# Patient Record
Sex: Male | Born: 1955 | Race: Black or African American | Hispanic: No | Marital: Single | Smoking: Never smoker
Health system: Southern US, Community
[De-identification: ages and names within clinical notes are randomized; demographics above are authoritative.]

## PROBLEM LIST (undated history)

## (undated) DIAGNOSIS — K219 Gastro-esophageal reflux disease without esophagitis: Secondary | ICD-10-CM

## (undated) DIAGNOSIS — K317 Polyp of stomach and duodenum: Secondary | ICD-10-CM

## (undated) DIAGNOSIS — K3184 Gastroparesis: Secondary | ICD-10-CM

## (undated) DIAGNOSIS — K2961 Other gastritis with bleeding: Secondary | ICD-10-CM

## (undated) DIAGNOSIS — I10 Essential (primary) hypertension: Secondary | ICD-10-CM

## (undated) DIAGNOSIS — I639 Cerebral infarction, unspecified: Secondary | ICD-10-CM

## (undated) DIAGNOSIS — F79 Unspecified intellectual disabilities: Secondary | ICD-10-CM

## (undated) DIAGNOSIS — R569 Unspecified convulsions: Secondary | ICD-10-CM

## (undated) DIAGNOSIS — E7800 Pure hypercholesterolemia, unspecified: Secondary | ICD-10-CM

## (undated) DIAGNOSIS — Z931 Gastrostomy status: Secondary | ICD-10-CM

## (undated) DIAGNOSIS — K56609 Unspecified intestinal obstruction, unspecified as to partial versus complete obstruction: Secondary | ICD-10-CM

## (undated) DIAGNOSIS — F209 Schizophrenia, unspecified: Secondary | ICD-10-CM

## (undated) DIAGNOSIS — R1310 Dysphagia, unspecified: Secondary | ICD-10-CM

## (undated) DIAGNOSIS — K2270 Barrett's esophagus without dysplasia: Secondary | ICD-10-CM

## (undated) DIAGNOSIS — K922 Gastrointestinal hemorrhage, unspecified: Secondary | ICD-10-CM

## (undated) DIAGNOSIS — D509 Iron deficiency anemia, unspecified: Secondary | ICD-10-CM

## (undated) HISTORY — PX: CRANIOTOMY FOR CYST FENESTRATION: SHX1409

## (undated) NOTE — *Deleted (*Deleted)
Physical Medicine and Rehabilitation Admission H&P    Chief Complaint  Patient presents with  . Loss of Consciousness  : HPI: [name] is a 27 year old right-handed male with history of iron deficiency anemia, type 2 diabetes mellitus, CVA left-sided weakness and dysphagia, Barrett's esophagus, GERD with gastroparesis, hypertension, hyperlipidemia, mental retardation, schizophrenia, seizure disorder and hypothyroidism.  Per chart review patient has lived in a group home since 2012.  He does have local family to check on him routinely.  Patient ambulated modified independent.  Patient is able to dress himself and is modified independent for ADLs.  Presented 10/20/2020 after unwitnessed fall at group home facility question syncope related.  Patient subsequently had another syncopal episode with EMS reported to be pale and diaphoretic.  Admission chemistries glucose 163 BUN 37 creatinine 1.70, blood cultures no growth to date, procalcitonin 0.11, lactic acid 1.4, troponin 23, hemoglobin 8.2.  He was tachycardic with heart rate 120-130 SPO2 88% on room air CBG 255, SARS coronavirus negative.  Cranial CT scan no acute intracranial abnormality.  Large area encephalomalacia involving the right frontal lobe.  Chest x-ray without evidence of acute infarct.  CT angiogram of chest negative for pulmonary emboli.  CT abdomen pelvis showed a large stable gastric hernia with associated esophageal dilatation and subsequent mass-effect on the trachea.  Echocardiogram with ejection fraction of 70 to 75% no wall motion abnormalities grade 1 diastolic dysfunction.  EEG suggestive of mild to moderate diffuse encephalopathy no seizure.  He underwent EGD on 10/21/2020 per Dr. Leone Payor that showed dilation in the entire esophagus.  Tortuous esophagus.  Esophageal mucosal changes secondary to established short segment Barrett's disease.  There was a 10 cm hiatal hernia.  2 gastric polyps were biopsied.  He was able  to return to a regular diet.  On the early morning of 10/22/2020 rapid response patient developed significant respiratory distress subsequently intubated transferred to the ICU under critical care service.  Patient underwent hiatal hernia repair with G-tube placement by cardiothoracic surgery Dr. Cliffton Asters on 10/26/2020 change to jejunal arm via indwelling gastrostomy tube 11/07/2020 per interventional radiology.  He was extubated 11/05/2020.  During hospitalization patient developed pneumonia aspiration versus ventilatory associated pneumonia he did complete a course of Unasyn followed by Zithromax and currently oxygenating well on room air.  Subcutaneous heparin for DVT prophylaxis.  Therapy evaluations completed with recommendations of physical medicine follow-up and patient was admitted for a comprehensive rehab program.  Review of Systems  Constitutional: Negative for chills and fever.  HENT: Negative for hearing loss.   Eyes: Negative for blurred vision and double vision.  Respiratory: Positive for shortness of breath. Negative for cough and wheezing.   Cardiovascular: Negative for chest pain and leg swelling.  Gastrointestinal: Positive for constipation. Negative for heartburn, nausea and vomiting.       GERD  Genitourinary: Negative for dysuria, flank pain and hematuria.  Musculoskeletal: Positive for falls and myalgias.  Skin: Negative for rash.  Neurological: Positive for seizures.       Dysphagia  Psychiatric/Behavioral:       Schizophrenia/mental retardation  All other systems reviewed and are negative.  Past Medical History:  Diagnosis Date  . Barrett esophagus   . DM II (diabetes mellitus, type II), controlled (HCC) 11/13/2020  . Dysphagia   . Gastroparesis   . GERD (gastroesophageal reflux disease)   . Hypercholesteremia   . Hypertension   . Iron deficiency anemia   . Mental retardation   . Schizophrenia (HCC)   .  Seizure [facility])    Past Surgical History:  Procedure  Laterality Date  . BIOPSY  10/21/2020   Procedure: BIOPSY;  Surgeon: Iva Boop, MD;  Location: [facility] ENDOSCOPY;  Service: Endoscopy;;  . CRANIOTOMY FOR CYST FENESTRATION    . ESOPHAGOGASTRODUODENOSCOPY N/A 01/09/2015   Procedure: ESOPHAGOGASTRODUODENOSCOPY (EGD);  Surgeon: Louis Meckel, MD;  Location: [facility] ENDOSCOPY;  Service: Endoscopy;  Laterality: N/A;  . ESOPHAGOGASTRODUODENOSCOPY N/A 10/26/2020   Procedure: ESOPHAGOGASTRODUODENOSCOPY (EGD);  Surgeon: Corliss Skains, MD;  Location: [facility] OR;  Service: Thoracic;  Laterality: N/A;  . ESOPHAGOGASTRODUODENOSCOPY (EGD) WITH PROPOFOL N/A 10/21/2020   Procedure: ESOPHAGOGASTRODUODENOSCOPY (EGD) WITH PROPOFOL;  Surgeon: Iva Boop, MD;  Location: [facility] ENDOSCOPY;  Service: Endoscopy;  Laterality: N/A;  . IR GASTR TUBE CONVERT GASTR-JEJ PER W/FL MOD SED  11/07/2020  . PEG PLACEMENT N/A 10/26/2020   Procedure: PERCUTANEOUS ENDOSCOPIC GASTROSTOMY (PEG) PLACEMENT;  Surgeon: Corliss Skains, MD;  Location: [facility] OR;  Service: Thoracic;  Laterality: N/A;  . XI ROBOTIC ASSISTED HIATAL HERNIA REPAIR N/A 10/26/2020   Procedure: XI ROBOTIC ASSISTED HIATAL HERNIA REPAIR;  Surgeon: Corliss Skains, MD;  Location: [facility] OR;  Service: Thoracic;  Laterality: N/A;   History reviewed. No pertinent family history. Social History:  reports that he has never smoked. He has never used smokeless tobacco. He reports that he does not drink alcohol and does not use drugs. Allergies:  Allergies  Allergen Reactions  . Vancomycin Other (See Comments)    Unknown-possible hives    Medications Prior to Admission  Medication Sig Dispense Refill  . benazepril (LOTENSIN) 20 MG tablet Take 20 mg by mouth daily.    . Cholecalciferol (VITAMIN D) 2000 units tablet Take 2,000 Units by mouth daily.    . divalproex (DEPAKOTE ER) 500 MG 24 hr tablet Take 500 mg by mouth daily.    Marland Kitchen esomeprazole (NEXIUM) 40 MG capsule Take 40 mg by mouth in the morning and at bedtime.    .  lamoTRIgine (LAMICTAL) 100 MG tablet Take 100 mg by mouth daily.    Marland Kitchen levothyroxine (SYNTHROID, LEVOTHROID) 75 MCG tablet Take 75 mcg by mouth daily.    . metoCLOPramide (REGLAN) 5 MG tablet Take 1 tablet (5 mg total) by mouth 3 (three) times daily before meals. (Patient taking differently: Take 5 mg by mouth in the morning and at bedtime. ) 90 tablet 0  . propranolol (INDERAL) 10 MG tablet Take 10 mg by mouth daily.    . QUEtiapine (SEROQUEL) 400 MG tablet Take 400 mg by mouth 2 (two) times daily.     . rosuvastatin (CRESTOR) 20 MG tablet Take 20 mg by mouth daily.      Drug Regimen Review Drug regimen was reviewed and remains appropriate with no significant issues identified  Home: Home Living Family/patient expects to be discharged to:: Private residence Living Arrangements: Group Home Home Access: Ramped entrance Home Equipment: Walker - 2 wheels, Grab bars - tub/shower Additional Comments: ramped entry, walk in shower    Functional History: Prior Function Level of Independence: Needs assistance Gait / Transfers Assistance Needed: Pt ambulated mod I with no device.  Required cues to slow down ADL's / Homemaking Assistance Needed: Pt was able to dress self, but staff would straighten out his clothes after the fact, and assist with fasters as needed.  He was showering mod I prior to september before hospitalizations, but after that hospitalization he was requiring assist with shower transfer and showering  Communication / Swallowing Assistance Needed:  low volume and difficult to understand at times  Comments: Info provided by pt's sister who is his POA   Functional Status:  Mobility: Bed Mobility Overal bed mobility: Needs Assistance Bed Mobility: Supine to Sit, Rolling Rolling: Supervision Supine to sit: Min assist Sit to supine: Mod assist General bed mobility comments: minA for trunk rise from flat HOB, needs vc/tcs for BLE placement Transfers Overall transfer level: Needs  assistance Equipment used: Rolling walker (2 wheeled) Transfers: Sit to/from Stand, Stand Pivot Transfers Sit to Stand: Min assist, Mod assist Stand pivot transfers: Min assist, +2 physical assistance, +2 safety/equipment General transfer comment: from EOB>chair, pt needs +20minA for safety due to feeding tube and catheter lines and pt impulsivity, pt with posterior lean prior to proximity to chair; pt minA to stand from EOB with +1 assist and up to modA to stand from chair height with +1 assist (x5 total reps) Ambulation/Gait Ambulation/Gait assistance: Min assist, +2 physical assistance, +2 safety/equipment Gait Distance (Feet): 5 Feet Assistive device: Rolling walker (2 wheeled) Gait Pattern/deviations: Step-to pattern, Decreased step length - left, Decreased weight shift to left, Shuffle General Gait Details: bed>chair, needs heavy vcs and assist to manage RW, pt impulsive to sit prior to reaching chair Gait velocity: decreased Gait velocity interpretation:  (grossly <0.2 m/s)    ADL: ADL Overall ADL's : Needs assistance/impaired Eating/Feeding: NPO Grooming: Wash/dry hands, Wash/dry face, Minimal assistance, Bed level Grooming Details (indicate cue type and reason): increased time, cueing for thoroughness of tasks  Upper Body Bathing: Maximal assistance, Sitting Lower Body Bathing: Maximal assistance, Sit to/from stand Upper Body Dressing : Moderate assistance, Sitting Upper Body Dressing Details (indicate cue type and reason): Mod A to don clean hospital gown Lower Body Dressing: Total assistance, Sit to/from stand Lower Body Dressing Details (indicate cue type and reason): Total A to don socks and brief Toilet Transfer: Minimal assistance, +2 for physical assistance, +2 for safety/equipment, Stand-pivot, BSC, RW Toilet Transfer Details (indicate cue type and reason): Min A x 2 for safety in [facility] transfer using RW due to L hemiparesis and quick pace Toileting- Clothing Manipulation  and Hygiene: Total assistance, Sit to/from stand Toileting - Clothing Manipulation Details (indicate cue type and reason): Total A for hygiene after BM Functional mobility during ADLs: Moderate assistance, +2 for physical assistance, +2 for safety/equipment General ADL Comments: pt limited by cognition, safety awareness, weakness, L hemi  Cognition: Cognition Overall Cognitive Status: History of cognitive impairments - at baseline Orientation Level: Oriented to person, Oriented to place, Disoriented to time, Disoriented to situation Cognition Arousal/Alertness: Awake/alert Behavior During Therapy: Flat affect Overall Cognitive Status: History of cognitive impairments - at baseline General Comments: Hx of intellectual disability. pt impulsive at times, but good following of 1-step commands  Physical Exam: Blood pressure 116/76, pulse 88, temperature 97.9 F (36.6 C), temperature source Oral, resp. rate 19, height 5\' 4"  (1.626 m), weight 70.6 kg, SpO2 96 %. Physical Exam Neurological:     Comments: Patient is alert male in no acute distress mentally challenged and makes good eye contact with examiner.  He provided his name and age.  He stated he was in the hospital but was not sure of the name.  Follows simple commands.     Results for orders placed or performed during the hospital encounter of 10/20/20 (from the past 48 hour(s))  Glucose, capillary     Status: Abnormal   Collection Time: 11/13/20  7:51 AM  Result Value Ref Range   Glucose-Capillary 154 (  H) 70 - 99 mg/dL    Comment: Glucose reference range applies only to samples taken after fasting for at least 8 hours.  CBC     Status: Abnormal   Collection Time: 11/13/20  8:07 AM  Result Value Ref Range   WBC 5.9 4.0 - 10.5 K/uL   RBC 4.08 (L) 4.22 - 5.81 MIL/uL   Hemoglobin 10.8 (L) 13.0 - 17.0 g/dL   HCT 16.1 (L) 39 - 52 %   MCV 86.0 80.0 - 100.0 fL   MCH 26.5 26.0 - 34.0 pg   MCHC 30.8 30.0 - 36.0 g/dL   RDW 09.6 (H) 04.5  - 15.5 %   Platelets 320 150 - 400 K/uL   nRBC 0.0 0.0 - 0.2 %    Comment: Performed at [facility], [address] [place], [place] [address]  Comprehensive metabolic panel     Status: Abnormal   Collection Time: 11/13/20  8:07 AM  Result Value Ref Range   Sodium 134 (L) 135 - 145 mmol/L   Potassium 4.3 3.5 - 5.1 mmol/L   Chloride 99 98 - 111 mmol/L   CO2 26 22 - 32 mmol/L   Glucose, Bld 169 (H) 70 - 99 mg/dL    Comment: Glucose reference range applies only to samples taken after fasting for at least 8 hours.   BUN 13 8 - 23 mg/dL   Creatinine, Ser 1.91 0.61 - 1.24 mg/dL   Calcium 9.2 8.9 - 47.8 mg/dL   Total Protein 7.8 6.5 - 8.1 g/dL   Albumin 2.8 (L) 3.5 - 5.0 g/dL   AST 20 15 - 41 U/L   ALT 15 0 - 44 U/L   Alkaline Phosphatase 101 38 - 126 U/L   Total Bilirubin 0.4 0.3 - 1.2 mg/dL   GFR, Estimated >29 >56 mL/min    Comment: (NOTE) Calculated using the CKD-EPI Creatinine Equation (2021)    Anion gap 9 5 - 15    Comment: Performed at [facility], [address] [place], [place] [address]  Magnesium     Status: None   Collection Time: 11/13/20  8:07 AM  Result Value Ref Range   Magnesium 1.9 1.7 - 2.4 mg/dL    Comment: Performed at [facility], [address] [place], [place] [address]  Phosphorus     Status: None   Collection Time: 11/13/20  8:07 AM  Result Value Ref Range   Phosphorus 3.8 2.5 - 4.6 mg/dL    Comment: Performed at [facility], [address], [place], [place] [address]  D-dimer, quantitative (not at [facility])     Status: Abnormal   Collection Time: 11/13/20  8:07 AM  Result Value Ref Range   D-Dimer, Quant 4.63 (H) 0.00 - 0.50 ug/mL-FEU    Comment: (NOTE) At the manufacturer cut-off value of 0.5 g/mL FEU, this assay has a negative predictive value of 95-100%.This assay is intended for use in conjunction with a clinical pretest probability (PTP) assessment model to exclude pulmonary embolism (PE) and deep venous thrombosis  (DVT) in outpatients suspected of PE or DVT. Results should be correlated with clinical presentation. Performed at [facility], [address] [place], [place] [address]   Glucose, capillary     Status: Abnormal   Collection Time: 11/13/20 12:23 PM  Result Value Ref Range   Glucose-Capillary 187 (H) 70 - 99 mg/dL    Comment: Glucose reference range applies only to samples taken  after fasting for at least 8 hours.  Glucose, capillary     Status: Abnormal   Collection Time: 11/13/20  4:35 PM  Result Value Ref Range   Glucose-Capillary 131 (H) 70 - 99 mg/dL    Comment: Glucose reference range applies only to samples taken after fasting for at least 8 hours.  Glucose, capillary     Status: Abnormal   Collection Time: 11/13/20  9:14 PM  Result Value Ref Range   Glucose-Capillary 175 (H) 70 - 99 mg/dL    Comment: Glucose reference range applies only to samples taken after fasting for at least 8 hours.  Glucose, capillary     Status: Abnormal   Collection Time: 11/14/20 12:04 AM  Result Value Ref Range   Glucose-Capillary 181 (H) 70 - 99 mg/dL    Comment: Glucose reference range applies only to samples taken after fasting for at least 8 hours.  Basic metabolic panel     Status: Abnormal   Collection Time: 11/14/20  4:33 AM  Result Value Ref Range   Sodium 134 (L) 135 - 145 mmol/L   Potassium 4.4 3.5 - 5.1 mmol/L   Chloride 97 (L) 98 - 111 mmol/L   CO2 25 22 - 32 mmol/L   Glucose, Bld 193 (H) 70 - 99 mg/dL    Comment: Glucose reference range applies only to samples taken after fasting for at least 8 hours.   BUN 15 8 - 23 mg/dL   Creatinine, Ser 1.61 0.61 - 1.24 mg/dL   Calcium 9.3 8.9 - 09.6 mg/dL   GFR, Estimated >04 >54 mL/min    Comment: (NOTE) Calculated using the CKD-EPI Creatinine Equation (2021)    Anion gap 12 5 - 15    Comment: Performed at [facility], [address] [place], [place] [address]  CBC     Status: Abnormal   Collection Time: 11/14/20  4:33  AM  Result Value Ref Range   WBC 6.3 4.0 - 10.5 K/uL   RBC 3.99 (L) 4.22 - 5.81 MIL/uL   Hemoglobin 10.6 (L) 13.0 - 17.0 g/dL   HCT 91.4 (L) 39 - 52 %   MCV 86.5 80.0 - 100.0 fL   MCH 26.6 26.0 - 34.0 pg   MCHC 30.7 30.0 - 36.0 g/dL   RDW 78.2 (H) 95.6 - 21.3 %   Platelets 350 150 - 400 K/uL   nRBC 0.0 0.0 - 0.2 %    Comment: Performed at [facility], [address], [place], [place] [address]  Glucose, capillary     Status: Abnormal   Collection Time: 11/14/20  4:33 AM  Result Value Ref Range   Glucose-Capillary 182 (H) 70 - 99 mg/dL    Comment: Glucose reference range applies only to samples taken after fasting for at least 8 hours.  Glucose, capillary     Status: Abnormal   Collection Time: 11/14/20  7:39 AM  Result Value Ref Range   Glucose-Capillary 170 (H) 70 - 99 mg/dL    Comment: Glucose reference range applies only to samples taken after fasting for at least 8 hours.  Glucose, capillary     Status: Abnormal   Collection Time: 11/14/20 11:54 AM  Result Value Ref Range   Glucose-Capillary 136 (H) 70 - 99 mg/dL    Comment: Glucose reference range applies only to samples taken after fasting for at least 8 hours.  Glucose, capillary     Status: Abnormal   Collection Time: 11/14/20  4:40  PM  Result Value Ref Range   Glucose-Capillary 175 (H) 70 - 99 mg/dL    Comment: Glucose reference range applies only to samples taken after fasting for at least 8 hours.  Glucose, capillary     Status: Abnormal   Collection Time: 11/14/20  7:43 PM  Result Value Ref Range   Glucose-Capillary 148 (H) 70 - 99 mg/dL    Comment: Glucose reference range applies only to samples taken after fasting for at least 8 hours.  Glucose, capillary     Status: Abnormal   Collection Time: 11/15/20 12:21 AM  Result Value Ref Range   Glucose-Capillary 174 (H) 70 - 99 mg/dL    Comment: Glucose reference range applies only to samples taken after fasting for at least 8 hours.  Glucose, capillary      Status: Abnormal   Collection Time: 11/15/20  5:07 AM  Result Value Ref Range   Glucose-Capillary 142 (H) 70 - 99 mg/dL    Comment: Glucose reference range applies only to samples taken after fasting for at least 8 hours.   CT ANGIO CHEST PE W OR WO CONTRAST  Result Date: 11/13/2020 CLINICAL DATA:  Respiratory failure. EXAM: CT ANGIOGRAPHY CHEST WITH CONTRAST TECHNIQUE: Multidetector CT imaging of the chest was performed using the standard protocol during bolus administration of intravenous contrast. Multiplanar CT image reconstructions and MIPs were obtained to evaluate the vascular anatomy. CONTRAST:  OMNIPAQUE IOHEXOL 350 MG/ML SOLN COMPARISON:  10/05/2007 FINDINGS: Cardiovascular: Contrast injection is sufficient to demonstrate satisfactory opacification of the pulmonary arteries to the segmental level. There is no pulmonary embolus or evidence of right heart strain. The size of the main pulmonary artery is normal. Heart size is normal, with no pericardial effusion. The course and caliber of the aorta are normal. There is no atherosclerotic calcification. Opacification decreased due to pulmonary arterial phase contrast bolus timing. Mediastinum/Nodes: -- No mediastinal lymphadenopathy. -- No hilar lymphadenopathy. -- No axillary lymphadenopathy. --there are few mildly enlarged bilateral supraclavicular lymph nodes of unknown clinical significance. -- Normal thyroid gland where visualized. -the esophagus is patulous and dilated. The patient appears to be status post prior hiatal hernia repair. There is a residual small hiatal hernia. Lungs/Pleura: There is atelectasis at the lung bases bilaterally with trace bilateral pleural effusions. There is mucous plugging and bronchial wall thickening primarily at the lung bases. There is right apical pleuroparenchymal scarring. There is no pneumothorax. Upper Abdomen: Contrast bolus timing is not optimized for evaluation of the abdominal organs. The stomach  is distended. There is a partially visualized gastrojejunostomy tube in place. Musculoskeletal: No chest wall abnormality. No bony spinal canal stenosis. Review of the MIP images confirms the above findings. IMPRESSION: 1. There is no evidence for acute pulmonary embolus or aortic dissection. 2. There is atelectasis at the lung bases bilaterally with trace bilateral pleural effusions. 3. There is mucous plugging and bronchial wall thickening primarily at the lung bases. Findings may be secondary to infectious or reactive bronchiolitis. 4. The esophagus is patulous and dilated. The patient appears to be status post prior hiatal hernia repair. There is a residual small hiatal hernia. 5. The stomach is distended. There is a partially visualized gastrojejunostomy tube in place. 6. Mildly enlarged bilateral supraclavicular lymph nodes of unknown clinical significance. Electronically Signed   By: Katherine Mantle M.D.   On: 11/13/2020 19:57       Medical Problem List and Plan: 1.  Decreased functional mobility secondary to acute hypoxic hypercapnic respiratory failure with  aspiration/ventilatory support pneumonia  -patient may *** shower  -ELOS/Goals: *** 2.  Antithrombotics: -DVT/anticoagulation: Subcutaneous heparin  -antiplatelet therapy: N/A 3. Pain Management: Oxycodone as needed 4. Mood: ***  -antipsychotic agents: Seroquel 400 mg twice daily 5. Neuropsych: This patient is not capable of making decisions on his own behalf. 6. Skin/Wound Care: Routine skin checks 7. Fluids/Electrolytes/Nutrition: Routine in and outs with follow-up chemistries 8.  Paraesophageal hernia/massively dilated esophagus.  Status post hernia repair percutaneous gastrostomy tube placement 10/26/2020 per cardiothoracic surgery Dr. Cliffton Asters changed to placement of jejunal arm via indwelling gastrostomy tube 11/07/2020 per interventional radiology.  Continue Protonix twice daily, MiraLAX twice daily, Colace twice daily.   Transition to bolus tube feeds 9.  Iron deficiency anemia.  Follow-up CBC 10.  Schizophrenia/intellectual delay/seizure disorder.  Valproic acid 250 mg twice daily, Lamictal 100 mg daily 11.  Hypothyroidism.  Synthroid 12.  Type 2 diabetes mellitus.  Hemoglobin A1c 5.8.  SSI. 13.  Hyperlipidemia.  Crestor 14.  History of CVA with left-sided residual weakness continue therapies as directed  ***  Charlton Amor, PA-C 11/15/2020

## (undated) NOTE — *Deleted (*Deleted)
Individualized overall Plan of Care [facility]) Patient Details Name: [name] MRN: [id number] DOB: Feb 01, 1956  Admitting Diagnosis: Debility  Hospital Problems: Principal Problem:   Debility     Functional Problem List: Nursing Bladder, Endurance, Medication Management, Nutrition, Safety  PT Balance, Endurance, Motor, Perception, Safety  OT Balance, Cognition, Endurance, Motor, Pain, Perception, Nutrition, Safety, Vision, Sensory  SLP Cognition, Nutrition  TR         Basic ADL's: OT Grooming, Bathing, Dressing, Toileting     Advanced  ADL's: OT       Transfers: PT Bed Mobility, Bed to Chair, Car, Occupational psychologist, Research scientist (life sciences): PT Ambulation, Psychologist, prison and probation services, Stairs     Additional Impairments: OT    SLP Swallowing      TR      Anticipated Outcomes Item Anticipated Outcome  Self Feeding S (pending NPO status change)  Swallowing  supervision   Basic self-care  S  Toileting  S   Bathroom Transfers S  Bowel/Bladder  Manage bladder w/ min assist  Transfers  supervision with LRAD  Locomotion  supervision with LRAD  Communication     Cognition     Pain  N/A  Safety/Judgment  Manage safety w/ cues and reminders   Therapy Plan: PT Intensity: Minimum of 1-2 x/day ,45 to 90 minutes PT Frequency: 5 out of 7 days PT Duration Estimated Length of Stay: 14-18 days OT Intensity: Minimum of 1-2 x/day, 45 to 90 minutes OT Frequency: 5 out of 7 days OT Duration/Estimated Length of Stay: 12-16 SLP Intensity: Minumum of 1-2 x/day, 30 to 90 minutes SLP Frequency: Total of 15 hours over 7 days of combined therapies SLP Duration/Estimated Length of Stay: 7-10 days    Team Interventions: Nursing Interventions Patient/Family Education, Bladder Management, Medication Management, Disease Management/Prevention, Discharge Planning, Dysphagia/Aspiration Precaution Training  PT interventions Ambulation/gait training, Discharge planning,  Functional mobility training, Psychosocial support, Therapeutic Activities, Balance/vestibular training, Disease management/prevention, Neuromuscular re-education, Therapeutic Exercise, Skin care/wound management, Wheelchair propulsion/positioning, Cognitive remediation/compensation, DME/adaptive equipment instruction, Pain management, Splinting/orthotics, UE/LE Strength taining/ROM, Community reintegration, Development worker, international aid stimulation, Patient/family education, Museum/gallery curator, UE/LE Coordination activities  OT Interventions Discharge planning, Warden/ranger, Pain management, Self Care/advanced ADL retraining, Therapeutic Activities, UE/LE Coordination activities, Visual/perceptual remediation/compensation, Therapeutic Exercise, Patient/family education, Functional mobility training, Disease mangement/prevention, Cognitive remediation/compensation, Firefighter, Fish farm manager, Neuromuscular re-education, Psychosocial support, UE/LE Strength taining/ROM, Wheelchair propulsion/positioning  SLP Interventions Cueing hierarchy, Therapeutic Activities, Therapeutic Exercise, Oral motor exercises, Dysphagia/aspiration precaution training, Patient/family education  TR Interventions    SW/CM Interventions Discharge Planning, Psychosocial Support, Patient/Family Education   Barriers to Discharge MD  {BARRIERS TO [employer]  Nursing      PT Decreased caregiver support, Home environment access/layout, Lack of/limited family support unsure of amount of assist available upon D/C  OT      SLP Nutrition means pt is able to d/c to PLOF if receiving bolus tube feeds, goal for PO diet  SW       Team Discharge Planning: Destination: PT- (group home) ,OT- Home , SLP-Long Term Care (LTC) Projected Follow-up: PT-Home health PT, OT-  Home health OT, SLP-24 hour supervision/assistance Projected Equipment Needs: PT-To be determined, OT- Tub/shower seat, To be  determined, SLP-To be determined Equipment Details: PT-has RW, OT-  Patient/family involved in discharge planning: PT- Patient,  OT-Patient unable/family or caregiver not available, SLP-Patient  MD ELOS: *** Medical Rehab Prognosis:  {IPOC REHAB MD MEDICAL PROGNOSIS:22200} Assessment: 30 year old right-handed male with history of iron  deficiency anemia, type 2 diabetes mellitus, CVA left-sided weakness and dysphagia, Barrett's esophagus, GERD with gastroparesis, hypertension, hyperlipidemia, mental retardation, schizophrenia, seizure disorder and hypothyroidism.  Presented 10/20/2020 after unwitnessed fall at group home facility question syncope related.  Patient subsequently had another syncopal episode with EMS reported to be pale and diaphoretic.  Admission chemistries glucose 163 BUN 37 creatinine 1.70, blood cultures no growth to date, procalcitonin 0.11, lactic acid 1.4, troponin 23, hemoglobin 8.2.  He was tachycardic with heart rate 120-130 SPO2 88% on room air CBG 255, SARS coronavirus negative.  Cranial CT scan no acute intracranial abnormality.  Large area encephalomalacia involving the right frontal lobe.  Chest x-ray without evidence of acute infarct.  CT angiogram of chest negative for pulmonary emboli.  CT abdomen pelvis showed a large stable gastric hernia with associated esophageal dilatation and subsequent mass-effect on the trachea.  Echocardiogram with ejection fraction of 70 to 75% no wall motion abnormalities grade 1 diastolic dysfunction.  EEG suggestive of mild to moderate diffuse encephalopathy no seizure.  He underwent EGD on 10/21/2020 per Dr. Leone Payor that showed dilation in the entire esophagus.  Tortuous esophagus.  Esophageal mucosal changes secondary to established short segment Barrett's disease.  There was a 10 cm hiatal hernia.  2 gastric polyps were biopsied.  He was able to return to a regular diet.  On the early morning of 10/22/2020 rapid response patient developed  significant respiratory distress subsequently intubated transferred to the ICU under critical care service.  Patient underwent hiatal hernia repair with G-tube placement by cardiothoracic surgery Dr. Cliffton Asters on 10/26/2020 change to jejunal arm via indwelling gastrostomy tube 11/07/2020 per interventional radiology.  He was extubated 11/05/2020.  During hospitalization patient developed pneumonia aspiration versus ventilatory associated pneumonia he did complete a course of Unasyn followed by Zithromax and currently oxygenating well on room air.    Patient with resulting functional deficits with mobility, endurance, self-care.  We will set goals for *** with PT/OT and *** with SLP.   Due to the current state of emergency, patients may not be receiving their 3-hours of Medicare-mandated therapy.  See Team Conference Notes for weekly updates to the plan of care

---

## 1999-09-27 ENCOUNTER — Encounter: Payer: Self-pay | Admitting: Family Medicine

## 1999-09-27 ENCOUNTER — Ambulatory Visit (HOSPITAL_COMMUNITY): Admission: RE | Admit: 1999-09-27 | Discharge: 1999-09-27 | Payer: Self-pay | Admitting: Family Medicine

## 2000-04-08 ENCOUNTER — Encounter (INDEPENDENT_AMBULATORY_CARE_PROVIDER_SITE_OTHER): Payer: Self-pay

## 2000-04-08 ENCOUNTER — Ambulatory Visit (HOSPITAL_COMMUNITY): Admission: RE | Admit: 2000-04-08 | Discharge: 2000-04-08 | Payer: Self-pay | Admitting: Gastroenterology

## 2000-09-11 ENCOUNTER — Encounter: Payer: Self-pay | Admitting: Emergency Medicine

## 2000-09-11 ENCOUNTER — Emergency Department (HOSPITAL_COMMUNITY): Admission: EM | Admit: 2000-09-11 | Discharge: 2000-09-11 | Payer: Self-pay | Admitting: Emergency Medicine

## 2000-10-25 ENCOUNTER — Emergency Department (HOSPITAL_COMMUNITY): Admission: EM | Admit: 2000-10-25 | Discharge: 2000-10-25 | Payer: Self-pay | Admitting: Emergency Medicine

## 2000-11-07 ENCOUNTER — Ambulatory Visit (HOSPITAL_COMMUNITY): Admission: RE | Admit: 2000-11-07 | Discharge: 2000-11-07 | Payer: Self-pay | Admitting: Family Medicine

## 2002-04-01 ENCOUNTER — Ambulatory Visit (HOSPITAL_COMMUNITY): Admission: RE | Admit: 2002-04-01 | Discharge: 2002-04-01 | Payer: Self-pay | Admitting: Gastroenterology

## 2002-04-01 ENCOUNTER — Encounter (INDEPENDENT_AMBULATORY_CARE_PROVIDER_SITE_OTHER): Payer: Self-pay | Admitting: Specialist

## 2002-04-01 DIAGNOSIS — K2270 Barrett's esophagus without dysplasia: Secondary | ICD-10-CM | POA: Insufficient documentation

## 2002-04-01 DIAGNOSIS — K449 Diaphragmatic hernia without obstruction or gangrene: Secondary | ICD-10-CM | POA: Insufficient documentation

## 2002-04-08 ENCOUNTER — Emergency Department (HOSPITAL_COMMUNITY): Admission: EM | Admit: 2002-04-08 | Discharge: 2002-04-09 | Payer: Self-pay | Admitting: Emergency Medicine

## 2002-04-09 ENCOUNTER — Encounter: Payer: Self-pay | Admitting: Emergency Medicine

## 2002-04-17 ENCOUNTER — Inpatient Hospital Stay (HOSPITAL_COMMUNITY): Admission: EM | Admit: 2002-04-17 | Discharge: 2002-04-22 | Payer: Self-pay | Admitting: Emergency Medicine

## 2002-04-17 ENCOUNTER — Encounter (INDEPENDENT_AMBULATORY_CARE_PROVIDER_SITE_OTHER): Payer: Self-pay | Admitting: Specialist

## 2002-04-17 ENCOUNTER — Encounter: Payer: Self-pay | Admitting: Internal Medicine

## 2002-04-18 ENCOUNTER — Encounter: Payer: Self-pay | Admitting: Internal Medicine

## 2002-04-21 ENCOUNTER — Encounter: Payer: Self-pay | Admitting: Gastroenterology

## 2002-04-24 ENCOUNTER — Emergency Department (HOSPITAL_COMMUNITY): Admission: EM | Admit: 2002-04-24 | Discharge: 2002-04-24 | Payer: Self-pay | Admitting: Emergency Medicine

## 2002-05-25 ENCOUNTER — Encounter: Payer: Self-pay | Admitting: Neurology

## 2002-05-25 ENCOUNTER — Ambulatory Visit (HOSPITAL_COMMUNITY): Admission: RE | Admit: 2002-05-25 | Discharge: 2002-05-25 | Payer: Self-pay | Admitting: Neurology

## 2002-06-05 ENCOUNTER — Encounter: Payer: Self-pay | Admitting: Neurosurgery

## 2002-06-09 ENCOUNTER — Encounter: Payer: Self-pay | Admitting: Neurosurgery

## 2002-06-10 ENCOUNTER — Encounter: Payer: Self-pay | Admitting: Neurosurgery

## 2002-06-10 ENCOUNTER — Encounter: Payer: Self-pay | Admitting: Pulmonary Disease

## 2002-06-10 ENCOUNTER — Encounter (INDEPENDENT_AMBULATORY_CARE_PROVIDER_SITE_OTHER): Payer: Self-pay | Admitting: *Deleted

## 2002-06-10 ENCOUNTER — Inpatient Hospital Stay (HOSPITAL_COMMUNITY): Admission: RE | Admit: 2002-06-10 | Discharge: 2002-06-19 | Payer: Self-pay | Admitting: Neurosurgery

## 2002-06-11 ENCOUNTER — Encounter: Payer: Self-pay | Admitting: Neurosurgery

## 2002-06-11 ENCOUNTER — Encounter: Payer: Self-pay | Admitting: Pulmonary Disease

## 2002-06-12 ENCOUNTER — Encounter: Payer: Self-pay | Admitting: Pulmonary Disease

## 2002-06-12 ENCOUNTER — Encounter: Payer: Self-pay | Admitting: Neurosurgery

## 2002-06-13 ENCOUNTER — Encounter: Payer: Self-pay | Admitting: Pulmonary Disease

## 2002-06-16 ENCOUNTER — Encounter: Payer: Self-pay | Admitting: Neurosurgery

## 2002-06-19 ENCOUNTER — Inpatient Hospital Stay (HOSPITAL_COMMUNITY)
Admission: RE | Admit: 2002-06-19 | Discharge: 2002-07-23 | Payer: Self-pay | Admitting: Physical Medicine & Rehabilitation

## 2002-06-19 ENCOUNTER — Encounter: Payer: Self-pay | Admitting: Neurosurgery

## 2002-06-22 ENCOUNTER — Encounter: Payer: Self-pay | Admitting: Physical Medicine & Rehabilitation

## 2002-06-23 ENCOUNTER — Encounter: Payer: Self-pay | Admitting: Physical Medicine & Rehabilitation

## 2002-07-01 ENCOUNTER — Encounter: Payer: Self-pay | Admitting: Physical Medicine & Rehabilitation

## 2002-07-03 ENCOUNTER — Encounter: Payer: Self-pay | Admitting: Physical Medicine & Rehabilitation

## 2002-07-19 ENCOUNTER — Encounter: Payer: Self-pay | Admitting: Internal Medicine

## 2002-07-30 ENCOUNTER — Emergency Department (HOSPITAL_COMMUNITY): Admission: EM | Admit: 2002-07-30 | Discharge: 2002-07-30 | Payer: Self-pay

## 2002-09-01 ENCOUNTER — Emergency Department (HOSPITAL_COMMUNITY): Admission: EM | Admit: 2002-09-01 | Discharge: 2002-09-02 | Payer: Self-pay

## 2002-09-01 ENCOUNTER — Encounter
Admission: RE | Admit: 2002-09-01 | Discharge: 2002-11-05 | Payer: Self-pay | Admitting: Physical Medicine & Rehabilitation

## 2003-06-01 ENCOUNTER — Encounter
Admission: RE | Admit: 2003-06-01 | Discharge: 2003-08-30 | Payer: Self-pay | Admitting: Physical Medicine & Rehabilitation

## 2003-07-13 ENCOUNTER — Encounter: Admission: RE | Admit: 2003-07-13 | Discharge: 2003-10-11 | Payer: Self-pay | Admitting: Neurology

## 2003-09-02 ENCOUNTER — Encounter
Admission: RE | Admit: 2003-09-02 | Discharge: 2003-12-01 | Payer: Self-pay | Admitting: Physical Medicine & Rehabilitation

## 2003-10-15 ENCOUNTER — Encounter: Payer: Self-pay | Admitting: Internal Medicine

## 2003-10-15 ENCOUNTER — Encounter: Admission: RE | Admit: 2003-10-15 | Discharge: 2003-10-15 | Payer: Self-pay | Admitting: Internal Medicine

## 2004-03-31 ENCOUNTER — Emergency Department (HOSPITAL_COMMUNITY): Admission: EM | Admit: 2004-03-31 | Discharge: 2004-04-01 | Payer: Self-pay | Admitting: Emergency Medicine

## 2004-08-21 ENCOUNTER — Encounter: Payer: Self-pay | Admitting: Gastroenterology

## 2004-08-21 ENCOUNTER — Ambulatory Visit (HOSPITAL_COMMUNITY): Admission: RE | Admit: 2004-08-21 | Discharge: 2004-08-21 | Payer: Self-pay | Admitting: Gastroenterology

## 2004-08-21 ENCOUNTER — Encounter (INDEPENDENT_AMBULATORY_CARE_PROVIDER_SITE_OTHER): Payer: Self-pay | Admitting: Specialist

## 2004-08-21 DIAGNOSIS — Z8719 Personal history of other diseases of the digestive system: Secondary | ICD-10-CM

## 2004-08-21 HISTORY — DX: Personal history of other diseases of the digestive system: Z87.19

## 2004-10-02 ENCOUNTER — Emergency Department (HOSPITAL_COMMUNITY): Admission: EM | Admit: 2004-10-02 | Discharge: 2004-10-03 | Payer: Self-pay | Admitting: Emergency Medicine

## 2005-06-22 ENCOUNTER — Emergency Department (HOSPITAL_COMMUNITY): Admission: EM | Admit: 2005-06-22 | Discharge: 2005-06-22 | Payer: Self-pay | Admitting: Emergency Medicine

## 2005-08-29 ENCOUNTER — Ambulatory Visit: Payer: Self-pay | Admitting: Gastroenterology

## 2006-01-14 ENCOUNTER — Inpatient Hospital Stay (HOSPITAL_COMMUNITY): Admission: EM | Admit: 2006-01-14 | Discharge: 2006-01-29 | Payer: Self-pay | Admitting: Emergency Medicine

## 2006-01-20 ENCOUNTER — Ambulatory Visit: Payer: Self-pay | Admitting: Internal Medicine

## 2006-01-22 ENCOUNTER — Encounter (INDEPENDENT_AMBULATORY_CARE_PROVIDER_SITE_OTHER): Payer: Self-pay | Admitting: Specialist

## 2006-02-12 ENCOUNTER — Ambulatory Visit: Payer: Self-pay | Admitting: Gastroenterology

## 2006-02-16 ENCOUNTER — Inpatient Hospital Stay (HOSPITAL_COMMUNITY): Admission: EM | Admit: 2006-02-16 | Discharge: 2006-02-20 | Payer: Self-pay | Admitting: *Deleted

## 2007-03-03 ENCOUNTER — Encounter: Admission: RE | Admit: 2007-03-03 | Discharge: 2007-03-03 | Payer: Self-pay | Admitting: Neurology

## 2007-03-20 ENCOUNTER — Inpatient Hospital Stay (HOSPITAL_COMMUNITY): Admission: EM | Admit: 2007-03-20 | Discharge: 2007-03-28 | Payer: Self-pay | Admitting: Emergency Medicine

## 2007-03-20 ENCOUNTER — Ambulatory Visit: Payer: Self-pay | Admitting: Pulmonary Disease

## 2007-03-21 ENCOUNTER — Encounter: Payer: Self-pay | Admitting: Cardiology

## 2007-03-21 ENCOUNTER — Ambulatory Visit: Payer: Self-pay | Admitting: Cardiology

## 2007-03-26 ENCOUNTER — Ambulatory Visit: Payer: Self-pay | Admitting: Internal Medicine

## 2007-04-22 ENCOUNTER — Inpatient Hospital Stay (HOSPITAL_COMMUNITY): Admission: EM | Admit: 2007-04-22 | Discharge: 2007-04-27 | Payer: Self-pay | Admitting: Emergency Medicine

## 2007-04-28 ENCOUNTER — Ambulatory Visit: Payer: Self-pay | Admitting: Gastroenterology

## 2007-05-13 ENCOUNTER — Ambulatory Visit: Payer: Self-pay | Admitting: Gastroenterology

## 2007-06-20 ENCOUNTER — Ambulatory Visit (HOSPITAL_COMMUNITY): Admission: RE | Admit: 2007-06-20 | Discharge: 2007-06-20 | Payer: Self-pay | Admitting: General Surgery

## 2007-07-24 ENCOUNTER — Ambulatory Visit: Payer: Self-pay | Admitting: Gastroenterology

## 2007-10-04 ENCOUNTER — Emergency Department (HOSPITAL_COMMUNITY): Admission: EM | Admit: 2007-10-04 | Discharge: 2007-10-04 | Payer: Self-pay | Admitting: Emergency Medicine

## 2007-10-05 ENCOUNTER — Ambulatory Visit: Payer: Self-pay | Admitting: Cardiovascular Disease

## 2007-10-05 ENCOUNTER — Ambulatory Visit: Payer: Self-pay | Admitting: Cardiology

## 2007-10-05 ENCOUNTER — Inpatient Hospital Stay (HOSPITAL_COMMUNITY): Admission: EM | Admit: 2007-10-05 | Discharge: 2007-10-14 | Payer: Self-pay | Admitting: Emergency Medicine

## 2007-10-07 ENCOUNTER — Encounter (INDEPENDENT_AMBULATORY_CARE_PROVIDER_SITE_OTHER): Payer: Self-pay | Admitting: *Deleted

## 2007-10-14 ENCOUNTER — Ambulatory Visit: Payer: Self-pay | Admitting: Gastroenterology

## 2008-01-09 ENCOUNTER — Ambulatory Visit: Payer: Self-pay | Admitting: Gastroenterology

## 2008-02-03 ENCOUNTER — Ambulatory Visit: Payer: Self-pay | Admitting: Gastroenterology

## 2008-02-10 ENCOUNTER — Encounter: Payer: Self-pay | Admitting: Gastroenterology

## 2008-02-10 ENCOUNTER — Ambulatory Visit: Payer: Self-pay | Admitting: Gastroenterology

## 2008-04-05 DIAGNOSIS — K3184 Gastroparesis: Secondary | ICD-10-CM | POA: Insufficient documentation

## 2008-04-05 DIAGNOSIS — K219 Gastro-esophageal reflux disease without esophagitis: Secondary | ICD-10-CM | POA: Insufficient documentation

## 2008-04-05 DIAGNOSIS — I10 Essential (primary) hypertension: Secondary | ICD-10-CM | POA: Insufficient documentation

## 2008-04-05 DIAGNOSIS — G8190 Hemiplegia, unspecified affecting unspecified side: Secondary | ICD-10-CM

## 2008-04-05 DIAGNOSIS — F32A Depression, unspecified: Secondary | ICD-10-CM | POA: Insufficient documentation

## 2008-04-05 DIAGNOSIS — R569 Unspecified convulsions: Secondary | ICD-10-CM | POA: Insufficient documentation

## 2008-04-05 DIAGNOSIS — G9341 Metabolic encephalopathy: Secondary | ICD-10-CM | POA: Diagnosis present

## 2008-04-05 DIAGNOSIS — E039 Hypothyroidism, unspecified: Secondary | ICD-10-CM | POA: Insufficient documentation

## 2008-04-05 DIAGNOSIS — F209 Schizophrenia, unspecified: Secondary | ICD-10-CM | POA: Insufficient documentation

## 2008-04-05 DIAGNOSIS — F329 Major depressive disorder, single episode, unspecified: Secondary | ICD-10-CM

## 2008-04-05 HISTORY — DX: Hemiplegia, unspecified affecting unspecified side: G81.90

## 2008-04-08 ENCOUNTER — Observation Stay (HOSPITAL_COMMUNITY): Admission: EM | Admit: 2008-04-08 | Discharge: 2008-04-10 | Payer: Self-pay | Admitting: Emergency Medicine

## 2008-04-09 ENCOUNTER — Encounter: Payer: Self-pay | Admitting: Gastroenterology

## 2008-04-10 ENCOUNTER — Encounter: Payer: Self-pay | Admitting: Gastroenterology

## 2008-04-14 ENCOUNTER — Ambulatory Visit: Payer: Self-pay | Admitting: Internal Medicine

## 2008-04-22 ENCOUNTER — Encounter: Payer: Self-pay | Admitting: Gastroenterology

## 2008-05-04 ENCOUNTER — Ambulatory Visit: Payer: Self-pay | Admitting: Gastroenterology

## 2008-05-04 DIAGNOSIS — D509 Iron deficiency anemia, unspecified: Secondary | ICD-10-CM | POA: Insufficient documentation

## 2008-05-12 ENCOUNTER — Ambulatory Visit (HOSPITAL_COMMUNITY): Admission: RE | Admit: 2008-05-12 | Discharge: 2008-05-12 | Payer: Self-pay | Admitting: Gastroenterology

## 2008-05-12 ENCOUNTER — Encounter: Payer: Self-pay | Admitting: Gastroenterology

## 2008-05-19 ENCOUNTER — Inpatient Hospital Stay (HOSPITAL_COMMUNITY): Admission: EM | Admit: 2008-05-19 | Discharge: 2008-05-24 | Payer: Self-pay | Admitting: Emergency Medicine

## 2008-05-19 ENCOUNTER — Telehealth: Payer: Self-pay | Admitting: Gastroenterology

## 2008-05-21 ENCOUNTER — Encounter: Payer: Self-pay | Admitting: Internal Medicine

## 2008-05-25 ENCOUNTER — Ambulatory Visit: Payer: Self-pay | Admitting: Internal Medicine

## 2008-05-28 ENCOUNTER — Telehealth: Payer: Self-pay | Admitting: Gastroenterology

## 2008-06-03 ENCOUNTER — Ambulatory Visit (HOSPITAL_COMMUNITY): Admission: RE | Admit: 2008-06-03 | Discharge: 2008-06-03 | Payer: Self-pay | Admitting: Gastroenterology

## 2008-07-21 ENCOUNTER — Encounter: Payer: Self-pay | Admitting: Gastroenterology

## 2008-09-15 ENCOUNTER — Emergency Department (HOSPITAL_COMMUNITY): Admission: EM | Admit: 2008-09-15 | Discharge: 2008-09-15 | Payer: Self-pay | Admitting: Emergency Medicine

## 2009-04-14 ENCOUNTER — Telehealth: Payer: Self-pay | Admitting: Gastroenterology

## 2009-06-16 ENCOUNTER — Ambulatory Visit (HOSPITAL_COMMUNITY): Admission: RE | Admit: 2009-06-16 | Discharge: 2009-06-16 | Payer: Self-pay | Admitting: Internal Medicine

## 2009-06-29 ENCOUNTER — Telehealth: Payer: Self-pay | Admitting: Gastroenterology

## 2009-07-01 ENCOUNTER — Ambulatory Visit (HOSPITAL_COMMUNITY): Admission: RE | Admit: 2009-07-01 | Discharge: 2009-07-01 | Payer: Self-pay | Admitting: Internal Medicine

## 2009-07-04 ENCOUNTER — Ambulatory Visit: Payer: Self-pay | Admitting: Gastroenterology

## 2009-07-04 DIAGNOSIS — K2100 Gastro-esophageal reflux disease with esophagitis, without bleeding: Secondary | ICD-10-CM | POA: Insufficient documentation

## 2009-07-04 DIAGNOSIS — R634 Abnormal weight loss: Secondary | ICD-10-CM

## 2009-07-04 DIAGNOSIS — R195 Other fecal abnormalities: Secondary | ICD-10-CM | POA: Insufficient documentation

## 2009-07-04 HISTORY — DX: Abnormal weight loss: R63.4

## 2009-07-05 LAB — CONVERTED CEMR LAB
Basophils Absolute: 0 10*3/uL (ref 0.0–0.1)
Eosinophils Absolute: 0.4 10*3/uL (ref 0.0–0.7)
Hemoglobin: 15.9 g/dL (ref 13.0–17.0)
Lymphocytes Relative: 30.2 % (ref 12.0–46.0)
Lymphs Abs: 2.7 10*3/uL (ref 0.7–4.0)
MCHC: 34.4 g/dL (ref 30.0–36.0)
Monocytes Absolute: 0.9 10*3/uL (ref 0.1–1.0)
Neutro Abs: 4.9 10*3/uL (ref 1.4–7.7)
RDW: 12.8 % (ref 11.5–14.6)

## 2009-07-12 ENCOUNTER — Encounter: Payer: Self-pay | Admitting: Gastroenterology

## 2009-07-12 ENCOUNTER — Ambulatory Visit: Payer: Self-pay | Admitting: Gastroenterology

## 2009-07-13 ENCOUNTER — Encounter: Payer: Self-pay | Admitting: Gastroenterology

## 2009-08-10 ENCOUNTER — Telehealth: Payer: Self-pay | Admitting: Gastroenterology

## 2009-09-07 ENCOUNTER — Telehealth: Payer: Self-pay | Admitting: Gastroenterology

## 2011-01-14 ENCOUNTER — Encounter: Payer: Self-pay | Admitting: Gastroenterology

## 2011-01-20 ENCOUNTER — Emergency Department (HOSPITAL_COMMUNITY)
Admission: EM | Admit: 2011-01-20 | Discharge: 2011-01-21 | Payer: Self-pay | Source: Home / Self Care | Admitting: Emergency Medicine

## 2011-01-20 LAB — COMPREHENSIVE METABOLIC PANEL
ALT: 17 U/L (ref 0–53)
AST: 33 U/L (ref 0–37)
Albumin: 3.9 g/dL (ref 3.5–5.2)
Alkaline Phosphatase: 49 U/L (ref 39–117)
GFR calc Af Amer: >60 mL/min (ref >60)
Potassium: 3.9 mEq/L (ref 3.5–5.1)
Sodium: 142 mEq/L (ref 135–145)
Total Protein: 8.2 g/dL (ref 6.0–8.3)

## 2011-01-20 LAB — DIFFERENTIAL
Basophils Relative: 1 % (ref 0–1)
Eosinophils Absolute: 0.1 10*3/uL (ref 0.0–0.7)
Lymphs Abs: 1.5 10*3/uL (ref 0.7–4.0)
Monocytes Absolute: 0.4 10*3/uL (ref 0.1–1.0)
Monocytes Relative: 6 % (ref 3–12)

## 2011-01-20 LAB — CBC
Hemoglobin: 15.7 g/dL (ref 13.0–17.0)
MCH: 29.1 pg (ref 26.0–34.0)
MCHC: 33.7 g/dL (ref 30.0–36.0)
MCV: 86.3 fL (ref 78.0–100.0)
Platelets: 283 10*3/uL (ref 150–400)

## 2011-01-20 LAB — RAPID URINE DRUG SCREEN, HOSP PERFORMED
Opiates: NOT DETECTED
Tetrahydrocannabinol: NOT DETECTED

## 2011-01-20 LAB — ETHANOL: Alcohol, Ethyl (B): 9 mg/dL (ref 0–10)

## 2011-01-21 ENCOUNTER — Emergency Department (HOSPITAL_COMMUNITY)
Admission: EM | Admit: 2011-01-21 | Discharge: 2011-01-21 | Payer: Self-pay | Source: Home / Self Care | Admitting: Emergency Medicine

## 2011-01-21 LAB — POCT I-STAT, CHEM 8
Calcium, Ion: 1.23 mmol/L (ref 1.12–1.32)
Chloride: 108 mEq/L (ref 96–112)
Glucose, Bld: 112 mg/dL — ABNORMAL HIGH (ref 70–99)
TCO2: 30 mmol/L (ref 0–100)

## 2011-01-21 LAB — CBC
MCHC: 32.5 g/dL (ref 30.0–36.0)
MCV: 88.2 fL (ref 78.0–100.0)
Platelets: 262 10*3/uL (ref 150–400)
RDW: 14.9 % (ref 11.5–15.5)
WBC: 3.7 10*3/uL — ABNORMAL LOW (ref 4.0–10.5)

## 2011-01-21 LAB — VALPROIC ACID LEVEL: Valproic Acid Lvl: 91.1 ug/mL (ref 50.0–100.0)

## 2011-05-08 NOTE — H&P (Signed)
NAME:  [name], DELORENZO NO.:  [id number]   MEDICAL RECORD NO.:  [id number]          PATIENT TYPE:  INP   LOCATION:  3733                         FACILITY:  [facility]   PHYSICIAN:  Lonia Blood, M.D.       DATE OF BIRTH:  03-23-1956   DATE OF ADMISSION:  04/08/2008  DATE OF DISCHARGE:                              HISTORY & PHYSICAL   PRIMARY CARE PHYSICIAN:  Dr. Baltazar Najjar and Dr. Jacalyn Lefevre with  [facility].   CHIEF COMPLAINT:  Weakness.   HISTORY OF PRESENT ILLNESS:  [name] is a 55 year old gentleman  with history of Barrett esophagus and gastroparesis who resides in a  local nursing home because of schizophrenia, bipolar disorder, and  seizure disorder, who was sent this morning from the nursing home  because a routine CBC check revealed the presence of a severe anemia  with hemoglobin of 5.4.  There is no reported history of hematemesis,  melena, or hematochezia.  Unfortunately, the patient is an unreliable  historian.  [name] has been evaluated by Dr. Russella Dar from  Gastroenterology for his Barrett esophagus, last time being in January  2009.  At that time, it was noted that he probably should undergo  another surveillance endoscopy.  It is unclear to me if he had the  procedure or not.  Nevertheless, reportedly, the patient has been doing  much better in regards to his nausea and vomiting on high doses of  proton pump inhibitor and Reglan.  Mr. Jeziorski denies any ecchymosis or  gum bleeding, but he reports he has been more fatigued for the past  several days.   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   MEDICATIONS:  At the skilled nursing home are:  1. Lamictal 100 mg daily.  2. Synthroid 75 mcg daily.  3. Aspirin 81 mg  daily.  4. Seroquel 150 mg at bedtime.  5. Reglan 20 mg before each meal and at the bedtime.  6. Protonix 40 mg twice a day.  7. Depakote 500 grams at bedtime.   ALLERGIES:  VANCOMYCIN.   FAMILY HISTORY:  Mother, breast cancer.  Father, coronary artery  disease.   SOCIAL HISTORY:  No alcohol, tobacco or drug abuse.  The patient's  listed code status is full code.  His listed next of kin is [name], who is the patient's sister.   REVIEW OF SYSTEMS:  Difficult to obtain, but roughly, the patient denies  any chest pain, denies shortness of breath, coughing, headaches, focal  weakness or numbness.  Other systems as per HPI.  All other systems are  negative.   PHYSICAL EXAMINATION:  VITAL SIGNS:  Upon admission, temperature 98.3,  blood pressure 110/73, pulse 87, respirations 16, and saturation 96% on  room air. GENERAL APPEARANCE:  A well-developed, well-nourished with  abdominal obesity, gentleman sitting on the stretcher.  He is alert and  oriented to place, person, and time.  HEENT:  Head normocephalic and  atraumatic.  Eyes with pupils equal, round, and react to light and  accommodation.  I cannot appreciate any scleral icterus.  Conjunctivae  are pale.  Tongue is pale.  Throat clear.  NECK:  Supple.  No JVD.  No  carotid bruits.  CHEST:  Clear to auscultation without wheezes, rhonchi,  or crackles.  HEART:  Regular rate and rhythm without murmurs, rubs or  gallops.  The patient's abdomen is distended, obese, and nontender.  Bowel sounds are present.  Lower extremity without edema.  SKIN:  Warm  and dry without any suspicious looking rashes.   LABORATORY DATA:  Laboratory values at the time of admission sodium 140,  potassium 4, chloride 102, BUN 10, creatinine 1.3, and glucose 107.  Coagulation studies within normal limits.  Hemoglobin 5.8, white blood  cell count 5.8, and platelet count 289,000.   ASSESSMENT AND PLAN:  1. Severe anemia, most likely again related to  chronic      gastrointestinal blood losses from the patient's severe Barrett      esophagus/esophagitis.  We will though proceed with a full workup      to rule out other causes for anemia including B12 deficiency, folic      acid deficiency, plastic anemia, and hemolysis.  With the normal      white blood cell count and platelet count, I really doubt that we      are dealing with bone marrow process.  With an MCV of 59, I do      strongly suspect iron deficiency again related to chronic      gastrointestinal losses.  My plan is to discontinue the aspirin,      place the patient on proton pump inhibitor intravenously, and      transfuse him 2 units of packed red blood cells, and reconsult the      gastroenterologist.  2. Seizure disorder, schizophrenia, and bipolar disorder.  I will      continue the patient's medication including Lamictal, Seroquel, and      Depakote without changes.      Lonia Blood, M.D.  Electronically Signed     SL/MEDQ  D:  04/08/2008  T:  04/08/2008  Job:  [id number]   cc:   Venita Lick. Russella Dar, MD, Placido Sou, M.D.  Frederica Kuster, MD

## 2011-05-08 NOTE — H&P (Signed)
NAME:  [name], [name]          ACCOUNT NO.:  [id number]   MEDICAL RECORD NO.:  [id number]          PATIENT TYPE:  INP   LOCATION:  1321                         FACILITY:  [facility]   PHYSICIAN:  Hedwig Morton. Juanda Chance, MD     DATE OF BIRTH:  May 20, 1956   DATE OF ADMISSION:  05/19/2008  DATE OF DISCHARGE:                              HISTORY & PHYSICAL   CHIEF COMPLAINT:  Vomiting coffee-ground material.   HISTORY:  [name] is a 55 year old African American male known to  the GI service, primary patient of Dr. Jacalyn Lefevre with [facility], who has history of recurrent GI bleeding secondary to  esophagitis and severe reflux disease.  He also has chronic problems  with gastroparesis.  His last admission was in April 2009.  At that time  had presented with weakness and was found to have a hemoglobin of 5.8 he  was transfused and underwent upper endoscopy with Dr. Lina Sar that  did reveal Barrett's esophagus, severe reflux esophagitis, and hiatal  hernia.  He has had multiple previous endoscopies as well over the past  4 to 5 years with known Barrett's and fairly chronic esophagitis.  He  had a gastric emptying scan done his last admission that showed 55%  retention at 2 hours.  He is maintained on b.i.d. PPI therapy.  At this  time he is brought back to the emergency room via EMS from [facility] after he vomited a large amount of coffee-ground material.  Apparently felt to be hypotensive when found by the EMTs.  However,  blood pressure in the emergency room was 158/97 and pulse of 120,  respirations 20, temperature was 98.  He was cooperative.  He was seen  and evaluated by Dr. Lina Sar and labs showed a hemoglobin of 10.3.  He was admitted, volume repleted, transfuse 2 units of packed RBCs and  will be scheduled for EGD.   CURRENT MEDICATIONS:  1. Protonix 40 mg twice daily.  2. Synthroid 0.75 daily.  3. Lamictal 100 mg daily.  4. Depakote extended  release 500 mg nightly.  5. Reglan 10 mg a.c. and at bedtime.  6. Seroquel 150 nightly.  7. Tylenol p.r.n.  8. Nu-Iron 150 daily.   ALLERGIES:  VANCOMYCIN.   FAMILY HISTORY:  Pertinent for mother with breast cancer.  Father with  history of coronary artery disease.   SOCIAL HISTORY:  The patient is single.  He is a resident of [facility] nursing home.  He is generally ambulatory.  He is a nonsmoker,  nondrinker.  Next of kin is listed as [name], who is his sister.   PAST HISTORY:  Pertinent for Barrett's esophagus, previous history of  aspiration pneumonia, severe GERD, gastroparesis, diverticular disease,  chronic iron deficiency anemia, hypothyroidism, schizophrenia, bipolar  disorder, and a seizure disorder.  He had a craniotomy for a third  ventricle colloid cyst in June 2003.   REVIEW OF SYSTEMS:  Difficult to obtain from the patient due to his  baseline mental status, but denies any chest pain or anginal symptoms.  Denies cough, shortness of breath  or sputum production.  Genitourinary:  Denies dysuria, urgency or frequency.  GI: As outlined above.  Musculoskeletal:  Negative.  All other review of systems negative.   LABORATORY STUDIES ON ADMISSION:  Hemoglobin 10.3, hematocrit 37.7 WBC  9.5, platelets 401,000.  Electrolytes within normal limits.  Gastroccult  tested and was positive.  UA 0-2 WBCs, rare bacteria.   EXAM:  Per Dr. Lina Sar, well-developed, pale light-skinned African  American male in no acute distress.  He is cooperative.  Blood pressure 158/97, pulse 120, respirations 20, temperature 98.6.  He is noted to have black vomitus in the emesis basin approximately 200  mL.  HEENT: Nontraumatic, normocephalic.  EOMI, PERRLA.  Sclerae anicteric.  Neck is supple without nodes.  No JVD.  CARDIOVASCULAR:  Tachy regular rhythm with S1-S2.  No murmur or gallop.  PULMONARY:  Scattered rhonchi.  Abdomen is soft.  He is mildly tender in the epigastrium.   Bowel sounds  are active.  Abdomen is protuberant.  There is no palpable mass.  No  ascites.  RECTAL:  Exam.  No stool in the rectal vault.  Mucous is Hemoccult  positive.  EXTREMITIES:  Without clubbing, cyanosis or edema.  NEURO:  Patient is alert, answers in a few word responses but is  appropriate.   IMPRESSION:  56. A 55 year old African American male with recurrent hematemesis      likely secondary to recurrent ulcerative esophagitis, rule out      Mallory Weiss tear.  2. Hypotension, resolved.  3. Anemia acute on chronic.  4. History of Barrett's esophagus and recurrent ulcerative      esophagitis.  5. History of gastroparesis on Reglan.  6. Seizure disorder.  7. Mental retardation and schizophrenia.  8. Hypothyroidism.  9. Status post craniotomy for ventricular cyst.   PLAN:  The patient is admitted to the service of Dr. Lina Sar for IV  fluid hydration.  He will be transfused 2 units of packed RBCs and will  be placed on b.i.d. PPI therapy IV.  Will plan upper endoscopy in a.m.  Serial H&H, and for further details, please see the orders.      Amy Mona, PA-C      Dora M. Juanda Chance, MD  Electronically Signed   AE/MEDQ  D:  05/20/2008  T:  05/20/2008  Job:  [id number]   cc:   Frederica Kuster, MD  Fax: [phone]

## 2011-05-08 NOTE — Assessment & Plan Note (Signed)
[facility]                         GASTROENTEROLOGY OFFICE NOTE   NAME:ROBINSONHazen, Brumett                 MRN:          [id number]  DATE:01/09/2008                            DOB:          [birth year]    This is a return office visit GERD, gastroparesis, and Barrett's  esophagus.  Unfortunately, his sister could not make it to the  appointment today.  A caregiver from [place] is with him today.  He apparently has had little problems with nausea and vomiting over the  past several months, and appears to have substantially improved on  Reglan 20 mg ac and hs.  He remains on Protonix 40 mg b.i.d.  He has a  history of Barrett's esophagus with a surveillance due at this time.  He  has no gastrointestinal complaints today.   MEDICATION ALLERGIES:  VANCOMYCIN.   CURRENT MEDICATIONS:  Listed on the chart, updated and reviewed.   EXAMINATION:  In no acute distress.  Weight 159.6 pounds.  Blood pressure is 116/64.  Pulse 96 and regular.  HEENT EXAM:  Anicteric sclerae.  Oropharynx clear.  CHEST:  Clear to auscultation bilaterally.  CARDIAC:  Regular rate and rhythm without murmurs.  ABDOMEN:  Soft and nontender with normoactive bowel sounds.   ASSESSMENT AND PLAN:  Gastroparesis. Gastroesophageal reflux disease.  Hiatal hernia. Barrett's esophagus.  His recurrent nausea and vomiting  is under good control on his current regimen of Protonix 40 mg b.i.d.  and metoclopramide 20 mg ac and hs.  We have previously discussed  changing to domperidone.  I think it is preferable to switch him to  domperidone for longterm usage, however, this medication is likely not  covered by Medicaid.  I have previously given his sister information on  how to obtain this medication and I will write another prescription if  his sister would like to proceed.  Plan for and EKG if he would like to  change to domperidone. The patient will remain on metoclopramide 20mg  ac  and hs for now.  Risks, benefits, and alternatives to upper endoscopy  with biopsies discussed with the patient and the caregiver.  The  patient's sister will need to sign informed consent.  Will not proceed  with a referral to [facility] at this time since his symptoms are under much  better control.     Venita Lick. Russella Dar, MD, [facility]  Electronically Signed    MTS/MedQ  DD: 01/13/2008  DT: 01/13/2008  Job #: [id number]

## 2011-05-08 NOTE — Discharge Summary (Signed)
NAME:  [name] NO.:  [id number]   MEDICAL RECORD NO.:  [id number]          PATIENT TYPE:  INP   LOCATION:  3733                         FACILITY:  [facility]   PHYSICIAN:  Lonia Blood, M.D.       DATE OF BIRTH:  [birth year]   DATE OF ADMISSION:  04/08/2008  DATE OF DISCHARGE:                               DISCHARGE SUMMARY   PRIMARY CARE PHYSICIAN:  Dr. Jacalyn Lefevre with [facility].   DISCHARGE DIAGNOSES:  1. Chronic iron deficiency anemia, status post transfusion of 2 units      of packed red blood cells.  2. Barrett's esophagus.  3. Severe gastroesophageal reflux disease.  4. Gastroparesis - persistent.  5. Hypertension.  6. Hypothyroidism.  7. Schizophrenia.  8. Bipolar disorder.  9. Diverticulosis.  10.Hypothyroidism.  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   DISCHARGE MEDICATIONS:  1. Depakote extended release 500 mg at bedtime.  2. Lamictal 100 mg daily.  3. Protonix 40 mg by mouth twice a day.  4. Reglan 10 mg by mouth before each meal and at bedtime.  5. Synthroid 75 mcg daily.  6. Seroquel 150 mg by mouth at bedtime.  7. Zantac 300 mg at bedtime.  8. Tylenol 650 mg by mouth every 6 hours as needed for severe pain.  9. Nu-Iron 150 mg by mouth daily.   CONDITION ON DISCHARGE:  [name] is discharged in good condition.  He will follow up with Dr. Claudette Head, from [facility].  He must have the appointment scheduled by the skilled nursing facility  and transportation provided.  Please note that [name] has had his  aspirin discontinued.   PROCEDURES:  1. During this admission on April 08, 2008, the patient underwent      transfusion of 2 units of packed red blood cells  2. On April 09, 2008, a gastric emptying study showing gastroparesis      with presence of 55% of content at 2 hours in the stomach.   CONSULTATIONS:  During this admission, the patient  was seen in  consultation by Dr. Hedwig Morton. [facility].   HISTORY AND PHYSICAL:  Refer to the dictated H&P done by Dr. Lavera Guise on  April 08, 2008.   HOSPITAL COURSE:  1. Severe anemia.  [name] was transferred from the skilled      nursing facility after he was found to have severe anemia on the      routine blood work.  A repeat CBC in the hospital indicated      presence of anemia with a hemoglobin of 5.8.  Full workup for the      patient's anemia disclosed presence of severe iron deficiency.  No      other problems were identified.  We have excluded B12 deficiency,      folic acid deficiency, bone marrow problems, and hemolysis.  We      again suspect that the patient's problem are related to his severe      gastroesophageal reflux disease and Barrett's esophagus.  Mr.      [name] was transfused 2 units of packed red blood cells and he      received an intravenous iron injection.  His discharge hemoglobin      is 8.7.  He is started on oral iron therapy and he is to follow up      with Dr. Claudette Head for further recommendations in regards to      his treatment of gastroesophageal reflux disease and anemia.  We      have continued [name] medications throughout this admission      and we have stopped his aspirin as it is clearly causing ongoing      gastrointestinal blood loss.  2. Schizophrenia, bipolar disorder, and seizure disorder.  We have not      witnessed any decompensation of these problems while the patient      was in the hospital.  He was continued on Seroquel, Lamictal and      Depakote without changes.      Lonia Blood, M.D.  Electronically Signed     SL/MEDQ  D:  04/10/2008  T:  04/10/2008  Job:  [id number]   cc:   Bertram Millard. Hyacinth Meeker, M.D.  Venita Lick. Russella Dar, MD, Clement[facility] Graham

## 2011-05-08 NOTE — Assessment & Plan Note (Signed)
[facility]                         GASTROENTEROLOGY OFFICE NOTE   NAME:[name], [name]                 MRN:          [id number]  DATE:05/13/2007                            DOB:          10/13/1956    HISTORY OF PRESENT ILLNESS:  Mr. Longest returns following  hospitalization for nausea, vomiting and GI bleeding.  He has not had  any recurrent symptoms since discharge. See the History and Physical  exam, discharge summary, procedure notes and pathology from April 22, 2007, to Apr 27, 2007.  One of the care givers from [place] is  with him today.  He has had chronic problems with GERD complicated by  Barrett's esophagus and recurrent episodes of nausea and vomiting for  several years.  He was previously evaluated by Drs. Avel Peace and  Abigail Miyamoto regarding possible antireflux surgery and repair of his  hiatal hernia. The situation is complicated by multiple medical problems  as outlined on the history and physical examination and discharge  summary.  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   CURRENT MEDICATIONS:  Listed on the chart - updated and reviewed.   ALLERGIES:  VANCOMYCIN.   PHYSICAL EXAMINATION:  VITAL SIGNS:  Weight 161 pounds, blood pressure  106/68, pulse 84 and regular.  CHEST:  Clear to auscultation bilaterally.  CARDIAC:  Regular rate and rhythm without murmurs.  ABDOMEN:  Soft and nontender with normoactive bowel sounds.   ASSESSMENT/PLAN:  GERD, HH and Barrett's. Recurrent nausea and vomiting  which is most likely related to GERD and regurgitation which leads to  episodes of vomiting, however, a neurologic cause of vomiting is  certainly possible, and this will need further evaluation by his primary  physician, neurologist and/or neurosurgeon.  He is to remain on Protonix  40 mg b.i.d. with metoclopramide taken before meals and at bedtime.  I  have  advised him and his care giver of the long term risks of  metoclopramide and it would be best if this medication could be  discontiued in the near future.  He may use Phenergan every 6 hours as  needed for vomiting.  He was given all standard instructions on dietary  measures for GERD, and he was advised to have the head of his bed  elevated to 30 degrees or greater at all times.  I will schedule a  follow up visit with Dr. Avel Peace.  Defer further evaluation of  potential neurogenic causes of nausea and vomiting to Dr. Leanord Hawking.  Return office visit in one year.     Venita Lick. Russella Dar, MD, [facility]  Electronically Signed   MTS/MedQ  DD: 05/13/2007  DT: 05/13/2007  Job #: [id number]   cc:   Maxwell Caul, M.D.  Adolph Pollack, M.D.

## 2011-05-08 NOTE — H&P (Signed)
NAMETYHEIM, VANALSTYNE NO.:  [id number]   MEDICAL RECORD NO.:  [id number]          PATIENT TYPE:  INP   LOCATION:  2924                         FACILITY:  [facility]   PHYSICIAN:  Hettie Holstein, D.O.    DATE OF BIRTH:  03-06-1956   DATE OF ADMISSION:  10/05/2007  DATE OF DISCHARGE:                              HISTORY & PHYSICAL   PRIMARY CARE PHYSICIAN:  Dr. Leanord Hawking.   SKILLED NURSING FACILITY:  [facility].   CHIEF COMPLAINT:  Tachycardia.   HISTORY OF PRESENT ILLNESS:  [name] is an unfortunate 55 year old  male with a longstanding history of gastroesophageal reflux, hiatal  hernia and Barrett's esophagus with episodic periods of hematemesis,  seen by [facility] in the past, and complaints of chronic  midepigastric and midsternal pain that he attributes to this diagnosis.  In any event, he presents today after he was sent home from the  emergency department yesterday with the diagnosis of pneumonia.  He was  noticed at skilled nursing facility to have tachycardia, and was sent to  the emergency department for further workup and evaluation.  He has a  previous history of a third ventricular mass, addressed by Dr. Julio Sicks in the past which was operatively managed by Dr. 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  He underwent  craniotomy.  He had some complications of the hematoma back in June  2003, has been maintained on chronic seizure medications.   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  This was in June 2003.  Diverticulosis.   MEDICATIONS:  As  provided by a listing from [facility]:  1. Synthroid 75 mcg daily.  2. Omeprazole 20 mg p.o. q.12 hours.  3. Aspirin 81 mg daily.  4. Senokot 2 by mouth daily.  5. Lamictal 100 mg by mouth daily.  6. Reglan 10 mg by mouth 4 times a day.  7. Seroquel 150 mg each evening.  8. Valproate 1500 mg by mouth every night.  9. Domperidone 10 mg by mouth before meals and at bedtime.  10.Phenergan 6 hours as needed for nausea.   ALLERGIES:  VANCOMYCIN.   FAMILY HISTORY:  His mother had breast cancer.  Father and coronary  artery disease.   SOCIAL HISTORY:  He denies alcohol and tobacco.   REVIEW OF SYSTEMS:  This is deferred at present.  [name] does not  appear to be the most reliable historian.  He does denies nausea and  vomiting which had been apparent issue of his chronically.  He reports  some midepigastric chest discomfort that he states is chronic in nature.  He denies any calf pain or lower extremity swelling.  His bowels are  moving regularly according to him.  Most of review of systems are  negative.   PHYSICAL EXAMINATION:  After thorough review in the emergency  department, his blood pressure is 122/84, temperature 98.4, heart rate  120s, respirations 30s, and O2 saturation 87% on 2-1/2 L.  HEENT:  Reveals head to be atraumatic.  Extraocular muscles appear to be  intact.  NECK:  Supple, nontender.  CARDIOVASCULAR EXAM:  Reveals normal S1-S2; tachycardiac.  LUNGS:  Diminished at the bases, left greater than right.  ABDOMEN:  Soft without rebound or guarding.  No rigidity.  LOWER EXTREMITIES:  Reveal no calf tenderness or edema.   EKG:  This revealed sinus tachycardia.  Appears to be a PR interval of  92 ms.   LABORATORY DATA:  His WBC was 21,000, hemoglobin 12, platelet count 292.  Sodium 134, BUN 23, creatinine 1.1, glucose 188.  His CT revealed no  evidence of PE per verbal report by Dr. Chilton Si.  No transcribed report is  available at this time.  Also was  reported dilated fluid-filled  esophagus that has been unchanged in comparison to previous.  Atelectasis bilaterally, left greater than right, perhaps pneumonia.   ASSESSMENT:  1. Left lower lobe pneumonia/hypoxia.  2. Hiatal hernia.  3. Barrett esophagus.  4. Systemic inflammatory response syndrome likely secondary to #1.  5. Status post craniotomy.  6. New colloid cyst.   PLAN:  At this time, we are going to admit [name] to a step-down  unit and assure adequate fluid resuscitation/hydration, follow his  clinical course and administer IV antibiotics for treatment of  aspiration versus facility-acquired pneumonia.  We will implement Zosyn  dose and Avelox, and await final transcribed report of CT scan .      Hettie Holstein, D.O.  Electronically Signed     ESS/MEDQ  D:  10/05/2007  T:  10/06/2007  Job:  [id number]

## 2011-05-08 NOTE — Discharge Summary (Signed)
NAME:  NORVAL, SLAVEN NO.:  [id number]   MEDICAL RECORD NO.:  [id number]          PATIENT TYPE:  INP   LOCATION:  1321                         FACILITY:  [facility]   PHYSICIAN:  Rachael Fee, MD   DATE OF BIRTH:  10-03-1956   DATE OF ADMISSION:  05/19/2008  DATE OF DISCHARGE:  05/24/2008                               DISCHARGE SUMMARY   ADMITTING DIAGNOSES:  44. A 55 year old African American male with recurrent coffee-ground      emesis likely secondary to recurrent ulcerative esophagitis, rule      out Mallory-Weiss tear.  2. Hypotension, resolved.  3. Anemia, acute on chronic.  4. History of Barrett's esophagus, with recurrent ulcerative      esophagitis.  5. Chronic gastroparesis.  6. Seizure disorder.  7. Mental retardation.  8. Schizophrenia.  9. Hypothyroidism.  10.Status post craniotomy for ventricular cyst.   DISCHARGE DIAGNOSES:  1. Stable status post self-limited bleed secondary to recurrent      ulcerative esophagitis.  2. Chronic gastroparesis contributing to above.  3. Anemia, stable.  4. Aspiration pneumonia secondary to recurrent vomiting, stable and      improving on Avelox.  5. Other diagnoses as listed above.   CONSULTATIONS:  [facility], I believe it was Dr.  Toniann Fail.   PROCEDURE:  Upper endoscopy.   BRIEF HISTORY:  Mr. Fugate is a 55 year old African American male  known to the GI service, primary patient of Dr. Jacalyn Lefevre with  [facility], who has a history of recurrent GI bleeding  secondary to esophagitis with severe reflux disease.  He has chronic  problems with gastroparesis and is maintained on Reglan.  His last  admission was in April 2009.  At that time, he presented with weakness  and was found to have a hemoglobin of 5.8.  He was transfused and then  underwent upper endoscopy with Dr. Juanda Chance which did show Barrett's  esophagus, severe reflux esophagitis, and a hiatal hernia.  He has  had  multiple endoscopies over the past years for recurrence of coffee-ground  emesis.  He has known chronic gastroparesis and is maintained on Reglan  as well as b.i.d. PPIs.  He is a resident of [facility], and on  the day of admission is brought to the emergency room after he vomited a  large amount of coffee-ground material.  He was felt to be hypotensive  by the EMS.  However, blood pressure on arrival in the emergency room  was 158/97, pulse was 120.  Hemoglobin was 10.3.  He was seen and  evaluated by Dr. Juanda Chance, volume repleted, transfused 2 units of packed  RBCs, and then admitted for supportive management and EGD.   LABORATORY STUDIES:  On admission, again WBC of 9.5, hemoglobin 12.6,  hematocrit of 37, platelets 401.  Electrolytes within normal limits.  CK  was 4.1.  Gastroccult was positive.  UA was negative.  Serial H&Hs were  obtained, and on May 20, 2008, hemoglobin 11, hematocrit of 34, that was  posttransfusion, and on May 21, 2008,  hemoglobin 9.9, hematocrit of  30.8.  On May 21, 2008, WBC of 10.3, hemoglobin 10.1, hematocrit of  31.2, platelets 321.   X-RAY STUDIES:  Chest x-ray on  May 20, 2008 showed a right upper  extremity PICC line.  No definite pneumonia or heart failure.  Then, on  May 21, 2008, there was airspace disease right base medially, which may  be due to pneumonia or aspiration.   HOSPITAL COURSE:  The patient was admitted to the service of Dr. Lina Sar.  He was volume repleted, placed on high-dose IV PPI therapy.  He  was transfused 2 units of packed RBCs initially, kept n.p.o., and  underwent upper endoscopy the following morning with Dr. Yancey Flemings.  This showed a severe reflux esophagitis with ulcerations present.  There  was no active bleeding noted.  We continued his Reglan.  This was then  converted to oral form.  He did not have any evidence of further active  bleeding.  Plans were to transfer him back to [facility] on May 21, 2008.  However, he had had a temperature to 101 and chest x-ray was  obtained and was consistent with an aspiration pneumonia.  We asked the  [facility] to follow him for his pneumonia.  He was placed  on a course of Avelox.  Had a benign course.  No further fevers or  evidence of any respiratory compromise, and by May 24, 2008 he was felt  stable for transfer back to [place] with a course of Avelox 400  mg p.o. daily for 3 more days.  His Reglan was also increased to 20 mg  1/2 hour before meals, and Carafate suspension 1 g between meals and at  bedtime for at least 2 more weeks. Protonix 40 mg twice daily.  It was  not felt necessary to continue him on Zantac 300 at bedtime while he is  on Protonix b.i.d.  Other medications as previous include Lamictal 100  mg daily, Synthroid daily, Depakote ER 500 mg at bedtime, Seroquel 300  mg at bedtime, Nu-Iron 150 daily, and Tylenol as needed.   DIET:  He should maintain a low-residue diet.  He needs to be in an  upright position for least a 1 hour after each meal.  He should not be  meals and bed, and again if he can be in an upright position for longer  than 1 hour after each meal that would be ideal.   CONDITION ON DISCHARGE:  Stable.      Mike Gip, PA-C      Rachael Fee, MD  Electronically Signed    AE/MEDQ  D:  05/24/2008  T:  05/24/2008  Job:  [id number]   cc:   [facility]

## 2011-05-08 NOTE — Assessment & Plan Note (Signed)
[facility]                         GASTROENTEROLOGY OFFICE NOTE   NAME:[name], [name]                 MRN:          [id number]  DATE:07/24/2007                            DOB:          1956/12/11    PROBLEM LIST:  1. Gastroparesis.  2. Gastroesophageal reflux disease.  3. Barrett's esophagus.  4. Hypothyroidism.  5. Hypertension.  6. Schizophrenia.  7. 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  8. Depression.   HISTORY OF PRESENT ILLNESS:  This is a 55 year old African-American male  that I have followed over the past several years, primarily for  refractory GERD complicated by Barrett's esophagus.  He underwent a  gastric emptying scan on June 20, 2007 which showed 98.5% retention at  60 minutes, and 55.2% retention at 120 minutes.  His metoclopramide was  increased to 20 mg a.c. and nightly.  He has not had as many episodes of  nausea and vomiting, and he feels his symptoms have been under better  control.  The majority of the information comes from his sister, [name], who will be the family member primarily in charge of his care.  His father passed away 3 months ago, and his mother passed away 8 years  ago.  He remains a resident at [facility].  He underwent recent  evaluation by Dr. Avel Peace for consideration of anti-reflux  surgery with repair of his hiatal hernia.  However, due to the marked  gastroparesis, surgical plans were postponed indefinitely.  His sister  has concerns about the ongoing use of Reglan.  She also is concerned  about other medications leading to delayed gastric emptying.   CURRENT MEDICATIONS:  Listed on the chart, updated and reviewed.   MEDICATION ALLERGIES:  VANCOMYCIN.   PHYSICAL EXAMINATION:  No acute distress.  Weight 164.6 pounds.  Blood pressure is 132/88, pulse 68 and regular.  CHEST:  Clear to auscultation bilaterally.  CARDIAC:   Regular rate and rhythm without murmurs appreciated.  ABDOMEN:  Soft and nontender with normoactive bowel sounds.   ASSESSMENT AND PLAN:  Gastroparesis, gastroesophageal reflux disease,  hiatal hernia, Barrett's esophagus, and recurrent nausea and vomiting.  His symptoms appear to be under better control on metoclopramide 20 mg  p.o. a.c. and nightly.  I have discussed the long-term potential side  effects of Reglan, and after a detailed discussion with the daughter, I  have advised to switch to domperidone 10 mg p.o. a.c. and nightly.  He  will remain on pantoprazole 40 mg p.o. b.i.d., and follow all standard  anti-reflux measures and dietary recommendations for gastroparesis.  If  his symptoms do not remain under good control on domperidone, we will  consider referral to [facility] for a  second opinion.  His sister, [name], will likely become his  guardian in the near future.  Return office visit 6-8 weeks.     Venita Lick. Russella Dar, MD, [facility]  Electronically Signed    MTS/MedQ  DD: 07/24/2007  DT: 07/25/2007  Job #: [id number]

## 2011-05-08 NOTE — Discharge Summary (Signed)
NAME:  [name], [name] NO.:  [id number]   MEDICAL RECORD NO.:  [id number]          PATIENT TYPE:  INP   LOCATION:  5730                         FACILITY:  [facility]   PHYSICIAN:  Isidor Holts, M.D.  DATE OF BIRTH:  [birth year]   DATE OF ADMISSION:  10/05/2007  DATE OF DISCHARGE:  10/13/2007                               DISCHARGE SUMMARY   PAST MEDICAL HISTORY:  Dr. Baltazar Najjar, [facility].   DISCHARGE DIAGNOSES:  1. Community-acquired pneumonia.  2. Gastroparesis.  3. Anemia, presumed secondary to chronic GI blood loss.  4. Barrett's esophagus.  5. Hypertension.  6. Hypothyroidism.  7. Mild oropharyngeal dysphagia.  8. History of diverticulosis.  9. Schizophrenia/bipolar disorder.  10.Fatty liver on abdominal ultrasound scan October 09, 2007.  11.Chest pain.   DISCHARGE MEDICATIONS:  1. Aspirin 81 mg p.o. daily (enteric-coated).  2. Synthroid 75 mcg p.o. daily.  3. Lamictal 100 mg p.o. daily.  4. Seroquel 150 mg p.o. q.h.s..  5. Reglan 10 mg p.o. q.a.c. and h.s.  6. Protonix 40 mg p.o. b.i.d.  7. Depakote 500 mg p.o. q.h.s.  8. Avelox 400 mg p.o. daily to be completed on October 18, 2007.   CONSULTATIONS:  1. Dr. Lewayne Bunting, [facility].  2. Dr. Melvia Heaps, Gastroenterology.   PROCEDURES:  1. Chest x-ray dated October 04, 2007, which showed cardiomegaly,      bibasilar atelectasis versus early infiltrate.  2. Chest x-ray dated October 05, 2007.  This showed low lung volumes      with bibasilar atelectasis and probable left lower lobe infiltrate,      small bibasilar effusions, air field distended esophagus to      diaphragm, question distal esophageal obstruction or achalasia.  3. Chest CT angiogram dated October 05, 2007.  This showed no acute      pulmonary embolism nor bilateral effusions and compressive      atelectasis, more focal consolidation at the left base, could      present early pneumonia.  Fluid-filled dilated  esophagus with      apparent wall thickening at the GE junction unchanged status post      prior CT of March 20, 2007.  4. Swallowing study dated October 06, 2007.  This showed mild      oropharyngeal dysphagia.  Recommended mechanical soft diet with      thin liquids, double follow with liquids, intermittent throat      clearing  5. Two-view chest x-ray dated October 08, 2007.  This showed slightly      better aeration of the left lung base with persistent basilar      atelectasis and effusions, no change in the esophageal dilatation      cardiomegaly and mild congestion.  6. Nuclear medicine gastric emptying study dated October 07, 2007.      This showed delayed gastric emptying with 60% remaining at 120      minutes within the stomach (normal is less than 30%).   IMPRESSION:  1. Delayed gastric emptying similar to prior examination.  2. Abdominal x-ray dated October 08, 2007.  NG tube not identified  in      the stomach, nonobstructive bowel gas pattern.  3. Abdominal ultrasound scan dated October 09, 2007.  This showed no      acute abdominal findings, gallbladder appeared normal, probable      fatty infiltration of the liver, right pleural effusion.  4. Chest x-ray dated October 11, 2007.  This showed right PICC tip in      the right atrium.  Suboptimal inspiration with bibasilar      atelectasis improved aeration in the lower lobes since 2 days ago.  5. Chest x-ray dated October 12, 2007.  This showed improved aeration      in the lower lobes with focal airspace pneumonia posteriorly in the      right lower lobe persisting.  No new abnormalities.  Right arm PICC      tip now in satisfactory position in lower SVC.  6. Myocardial perfusion SPECT imaging (resting and gated adenosine      stress) dated October 13, 2007.  This was negative for      pharmacologic stress-induced ischemia.  LVEF 53%.  7. A 2-D echocardiogram dated October 07, 2007.  This showed overall      normal LV  systolic function, probably trileaflet, but not well      seen.  There was trivial aortic valvular regurgitation.  Left      atrium was mildly dilated.   ADMISSION HISTORY:  See H&P notes of October 05, 2007, dictated by Dr.  Hannah Beat.  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  On initial  evaluation, BP was found to be elevated at 182/86, temperature was 98.4,  heart rate in the 120s.  The patient was saturating at 87% on 2.5 liters  of oxygen.  Initial imaging studies revealed possible left lower lobe  pneumonia.  He was admitted for further evaluation, investigation and  management.   CLINICAL COURSE:  #1 - COMMUNITY-ACQUIRED PNEUMONIA.  For details of  presentation, refer to admission history above.  The patient's initial  imaging studies demonstrated findings consistent with left lower lobe  pneumonia.  The patient was initially managed with a combination of  intravenous Zosyn and Avelox, bronchodilator nebulizers, oxygen  supplementation with satisfactory clinical response.  WBC which was 21  at the time of presentation had by October 09, 2007, normalized at 7.2  and the patient was afebrile.  And as a matter of fact, chest x-ray  showed improved aeration in the lungs.  It was felt appropriate at this  time, to modify antibiotic to monotherapy with the Avelox.  This was  done, without any deleterious effects and by October 12, 2007, we were  able to transition the patient to oral Avelox, to be completed on  October 18, 2007.  The patient's oxygen requirements have improved  during the course of his hospitalization and as of October 10, 2007, he  was ambulating   #2 - MILD OROPHARYNGEAL DYSPHAGIA.  The patient was evaluated by speech  pathologist during the course of hospitalization.  Modified barium  swallow examination on October 06, 2007, showed findings consistent with  mild oropharyngeal dysphagia.  The patient has been recommended a  dysphagia III diet, which has  been implemented and tolerated well by the  patient.   #3 - GASTROPARESIS.  The patient had imaging studies which indicated an  air-filled esophagus extending into the lower chest near the  gastroesophageal junction raising the suspicion of possible achalasia of  the cardia.  GI consultation was kindly provided by Dr. Melvia Heaps,  particularly as the patient complained of nausea and retrosternal  discomfort.  He underwent gastric emptying study on October 07, 2007,  which confirmed gastroparesis.  He was managed with Reglan q.a.c. and  h.s. with resolution of symptoms.  He has been recommended a  gastroparesis diet, i.e. low-residue, frequent small volume meals.  This  has been implemented and he has tolerated this well.  As of October 13, 2007, he was asymptomatic from the GI viewpoint.   #4 - CHEST PAIN.  As mentioned above, the patient experienced chest  discomfort, likely this is GI related.  Be that as it may, [facility] was consulted.  Consult was kindly provided by Dr. Lewayne Bunting  who although felt that this was likely noncardiac chest pain,  recommended a stress Myoview which was carried out on October 13, 2007,  and was negative for ischemia.  The patient has been reassured  accordingly.   #5 - HISTORY OF BARRETT'S ESOPHAGUS.  This has been managed during the  course of this hospitalization, with twice-daily proton pump inhibitor,  which will be continued following discharge.   #6 - TRANSIENT DIARRHEA.  The patient did have an episode of diarrhea on  October 09, 2007.  His laxatives were discontinued and stool samples  were  sent off for C.  Difficile toxin, but returned negative.  By  October 10, 2007, diarrhea had resolved.  There has been no recurrence  since.   #7 - HYPOTHYROIDISM.  The patient continues on pre-admission thyroxine  replacement therapy.   #8 - ANEMIA.  The patient on October 07, 2007, had a hemoglobin of 8.7  with a hematocrit of 26.8.  he was also stool guaiac positive.  This was  addressed with transfusion of two units of clear PRBCs resulting in  satisfactory bump in hemoglobin to 12.3 on October 08, 2007.  Hemoglobin  levels have remained at this level, thereafter.  Certainly, the patient  had no overt symptoms of acute GI bleed during the course of his  hospitalization and folate level was over 20, B12 was 187.  The patient  had a known history of Barrett's esophagus as well as diverticulosis.  It is likely that this is anemia of chronic GI blood loss.  He will  continued to follow up with [facility] following  discharge.   #9 - HISTORY OF SCHIZOPHRENIA/BIPOLAR DISORDER.  The patient remained on  his psychotropic medications, throughout the course of his  hospitalization and there were no problems referable to his psychiatric  problems, during the course of his hospitalization.   #10 - FATTY LIVER.  This was an incidental finding on abdominal  ultrasound scan of October 09, 2007.   #11 - SEIZURE DISORDER.  The patient continued on pre-admission  anticonvulsant medications.  No seizure episodes were recorded during  the course of hospitalization.   DISPOSITION:  The patient was on October 13, 2007, considered  sufficiently clinically stable and recovered to be discharged on October 14, 2007, provided no acute problems arise in the interim.   DISCHARGE INSTRUCTIONS:  1. Diet: Heart-healthy, mechanical soft diet with thin liquids, double  swallow with liquids intermittent throat clearing.  Meals are      recommended to be small volume and frequent i.e.  gastroparesis      diet.  2. Activity: As tolerated, otherwise per PT/OT.   FOLLOW-UP INSTRUCTIONS:  The patient is recommended to follow up with  his primary MD, Dr. Baltazar Najjar, [facility] per prior  scheduled appointment.  He is also to follow up with Dr. Claudette Head,  [facility] per prior scheduled appointment.      Isidor Holts, M.D.  Electronically Signed     CO/MEDQ  D:  10/13/2007  T:  10/14/2007  Job:  [id number]   cc:   Maxwell Caul, M.D.

## 2011-05-08 NOTE — Consult Note (Signed)
NAME:  [name], [name] NO.:  [id number]   MEDICAL RECORD NO.:  [id number]          PATIENT TYPE:  INP   LOCATION:  1321                         FACILITY:  [facility]   PHYSICIAN:  Hettie Holstein, D.O.    DATE OF BIRTH:  August 20, 1956   DATE OF CONSULTATION:  05/21/2008  DATE OF DISCHARGE:                                 CONSULTATION   PRIMARY CARE PHYSICIAN:  Bertram Millard. Hyacinth Meeker, M.D. and Maxwell Caul,  M.D., [facility].   REFERRING PHYSICIAN:  Gastroenterologist, Dr. Marina Goodell.   REASON FOR CONSULTATION:  Pneumonia on chest x-ray.   HISTORY OF PRESENT ILLNESS:  [name] is a 55 year old resident of  [facility] known to our medical service.  He was admitted to the  gastroenterology service for large amount of coffee ground emesis at  [facility] on May 27.  He does have a history of severe  esophagitis and chronic gastroparesis.  He underwent an EGD on May 20, 2008, that revealed severe esophagitis and has been placed on treatment  regimen as directed by Dr. Yancey Flemings.  In any event his course has been  unremarkable.  He has undergone 2 units of PRBC transfusion.  His  hemoglobin appears to be stable.  He presented with a hemoglobin of 10.3  that decreased after transfusion to 12.6 now to 10.1.  In any event,  this past evening he did spike a temperature of 101 and had radiographic  air space disease in his right base, possibly representing pneumonia or  aspiration.  The particular circumstances suggest air aspiration.  He is  currently asymptomatic at this time.  In any event we were asked to  assist in the management of this probable aspiration pneumonia.   PAST MEDICAL HISTORY:  [name] past medical history is  significant for a GI bleed and severe esophagitis as noted in the past.  He has prior history of hypertension though I do not see that he is on  medications for this.  Actually his blood pressure is on the marginal  side today.   History of hypothyroidism.  He is on replacement therapy.  Schizophrenia, history of seizure disorder.  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   MEDICATIONS:  Medications at the skilled setting are as follows:  1. Protonix 40 mg twice daily.  2. Synthroid 75 mcg daily.  3. Protonix 40 mg twice daily.  4. Reglan 10 mg q. a.c. and at bedtime.  5. Depakote ER 500 mg daily at bedtime.  6. Seroquel 150 mg daily at bedtime.  7. Zantac 300 mg daily at bedtime.  8. Nu-Iron 150 mg daily.  9. Tylenol 325 mg every six hours as needed.   ALLERGIES:  VANCOMYCIN for which he develops rash.   FAMILY HISTORY:  Is significant for breast cancer in the family and his  father also has history of coronary disease your.   SOCIAL HISTORY:  He denies tobacco or alcohol.   REVIEW OF SYSTEMS:  He denies nausea, chest  pain, shortness of breath,  fevers or chills.  Further review of systems is unremarkable.   PHYSICAL EXAMINATION:  VITAL SIGNS:  He had a blood pressure of 92/56,  heart rate was 84, respirations 18, temperature max was 101 and  temperature current is 98.8.  O2 saturation 94% on 2 liters.  HEENT:  His head is normocephalic, atraumatic.  Extraocular muscles are  intact.  NECK:  Supple, nontender.  No palpable thyromegaly or mass.  CARDIOVASCULAR:  Reveals normal S1-S2.  LUNGS:  He exhibited normal effort.  There was no dullness to  percussion.  LUNGS:  Clear bilaterally.  ABDOMEN:  Soft without rebound or guarding.  LOWER EXTREMITIES:  Revealed no edema or no calf tenderness as noted  above.  He was alert and oriented x3, moving all four extremities  without focal neurologic deficit.   LABORATORY DATA:  His hemoglobin was 9.9.  I did not have current basic  metabolic panel for today and his urinalysis on day of admission was  unremarkable.  In addition cardiac markers were also  unremarkable.  His  stool Hemoccult was positive.  Chest radiograph was as described above.   ASSESSMENT:  1. Probable aspiration pneumonia especially in the context of his      episode of large emesis.  It would be reasonable to cover him      empirically for aspiration with Unasyn while he is in the hospital      and transition to Augmentin to conclude on June 3 to finish out his      course; this can be finished at the skilled facility setting.  2. Gastrointestinal bleed status post EGD (esophagitis).  3. Anemia secondary to acute blood loss.  Follow up an H and H in the      morning.  4. Seizure disorder.  5. Hypothyroidism.  6. Status post craniotomy as described above.   RECOMMENDATION:  At this time it would be reasonable to initiate empiric  antibiotics on [name] with Unasyn and follow his clinical response  course.  If he does remain stable and improve and in satisfactory  condition it would be reasonable to discharge him on oral antibiotics to  conclude on 05/26/2008.  Thank you for this consultation.      Hettie Holstein, D.O.  Electronically Signed     ESS/MEDQ  D:  05/21/2008  T:  05/21/2008  Job:  [id number]   cc:   Maxwell Caul, M.D.   Bertram Millard. Hyacinth Meeker, M.D.  Fax: [phone]

## 2011-05-08 NOTE — Consult Note (Signed)
NAME:  [name], [name] NO.:  [id number]   MEDICAL RECORD NO.:  [id number]          PATIENT TYPE:  INP   LOCATION:  2924                         FACILITY:  [facility]   PHYSICIAN:  Learta Codding, MD,FACC DATE OF BIRTH:  [birth year]   DATE OF CONSULTATION:  10/07/2007  DATE OF DISCHARGE:                                 CONSULTATION   PRIMARY CARE PHYSICIAN:  Dr. Maxwell Caul, M.D.   REQUESTING PHYSICIAN:  [facility], Dr. Ritta Slot, MD.   PRIMARY CARDIOLOGIST:  [place], Dr. Learta Codding, MD.   REASON FOR CONSULTATION:  Chest pain.   HISTORY OF PRESENT ILLNESS:  A 55 year old African-American male  admitted from assisted living with no prior cardiac history, with  diagnosis of pneumonia and epigastric pain.  The patient began  experiencing chest pain after eating which was relieved by  nitroglycerin.  The patient has also had several loose stools and  projectile vomiting since admission.  The patient is being followed by  GI and has been diagnosed with gastroparesis and Barrett's esophagus  with gastric emptying study today.  We are asked to see the patient for  ongoing chest discomfort relieved with nitroglycerin for any further  input.  The patient did have some EKG changes showing some T wave  inversion anterior laterally which were new from his admission.   REVIEW OF SYSTEMS:  Positive for nausea, vomiting, epigastric pain,  chest discomfort, and mild shortness of breath with generalized weakness  and fatigue.   PAST MEDICAL HISTORY:  1. GI bleed.  2. Hiatal hernia.  3. Barrett's esophagus.  4. Hyperthyroidism.  5. Hypertension.  6. Schizophrenia.  7. Seizure disorder.  8. Status post craniotomy for removal of third ventricular mass.   SOCIAL HISTORY:  The patient lives at [facility].  He does not  smoke.  He does not drink alcohol.   FAMILY HISTORY:  Mother with breast cancer.  Father with CAD.   CURRENT MEDICATIONS:  1. Insulin per  sliding scale.  2. Low molecular weight heparin.  3. Aspirin 81 mg every day.  4. Reglan 10 mg every 6 hours.  5. Senokot q.h.s.  6. Synthroid 75 mg once a day.  7. Lamictal 100 mg once a day.  8. Seroquel 150 mg q.h.s.  9. Protonix 40 mg IV every day.  10.Zosyn 3.375 every 6 hours.  11.Avelox 400 mg IV every day.  12.Depakene 500 mg IV q.h.s.  13.Phenergan and Zofran p.r.n. nausea and vomiting.  14.Nitroglycerin p.r.n. chest pain.   ALLERGIES:  VANCOMYCIN.   CURRENT LABS:  Hemoglobin 8.7, hematocrit 26.8, white blood cells 9.9,  platelets 272,000.  Sodium 141, potassium 3.6, chloride 106, CO2 29, BUN  17, creatinine 1.25, glucose 75.  TSH 2.666.  Hemoglobin A1c 5.4.  Point  of care cardiac markers:  Troponin less than 0.05, MB less than 1, CK  76.5.  A chest x-ray reveals left lower lobe infiltrate with bibasilar  atelectasis.  CT of the chest negative for PE, bilateral effusions with  compressive atelectasis, fluid filled dilated esophagus with wall  thickening at the GE junction.  EKG  revealing sinus tachycardia with T  wave inversions V3-V6, and flattening inferiorly in lead 1.   PHYSICAL EXAMINATION:  VITAL SIGNS:  Blood pressure 119/67, pulse 79,  respirations 23, O2 sat 96% on room air, temperature 98.1 with a T-max  of 99.4.  HEENT:  Head is normocephalic, atraumatic.  Eyes are PERRLA.  Mucous  membranes of mouth pink and moist.  Tongue is midline.  NECK:  Supple.  There is no JVD.  No carotid bruits appreciated.  CARDIOVASCULAR:  A 1/6 systolic murmur, tachycardic.  Pulses are 2+ and  equal bilaterally.  LUNGS:  Diminished bibasilar without wheezes, rales, or rhonchi.  ABDOMEN:  Soft.  Hyperactive bowel sounds with mild epigastric  tenderness.  EXTREMITIES:  Without clubbing, cyanosis, or edema.  NEURO:  Cranial nerves II-XII are grossly intact.   IMPRESSION:  1. Atypical chest pain, likely related to esophageal spasm.  EKG      changes may be related to demand  ischemia secondary to anemia.      Will transfuse and evaluate further with echocardiogram for left      ventricular function, monitor cardiac biomarkers, and EKGs.  Will      add nitrates topically after transfusion and continue as long as      blood pressure holds.  Will hemoccult stools and monitor closely.      Will not add beta blockers at this time for the tachycardia until      anemia is resolved as this may be related to this.  2. Gastroparesis with fluid filled esophageal.  More treatment per      gastroenterology.  3. Pneumonia, probable aspiration, secondary to number 1 and number 2.      Continue antibiotics as you are doing.   We will follow making further recommendations throughout hospital course  and response to treatment.      [name]. Gerald Bishop, NP      Learta Codding, MD,FACC  Electronically Signed    KML/MEDQ  D:  10/07/2007  T:  10/08/2007  Job:  [id number]   cc:   Maxwell Caul, M.D.

## 2011-05-11 NOTE — Discharge Summary (Signed)
[name], [name]          ACCOUNT NO.:  [id number]   MEDICAL RECORD NO.:  [id number]          PATIENT TYPE:  INP   LOCATION:  1424                         FACILITY:  [facility]   PHYSICIAN:  Beckey Rutter, MD  DATE OF BIRTH:  14-Nov-1956   DATE OF ADMISSION:  03/20/2007  DATE OF DISCHARGE:                               DISCHARGE SUMMARY   CHIEF COMPLAINT:  On admission vomiting.   BRIEF HISTORY OF PRESENT ILLNESS:  The patient is a 55 year old resident  of [facility].  He was transferred from there for multiple  episodes of vomiting.  Please refer to the full H&P dictated on the  admission date by Dr. Oneita Hurt.   HOSPITAL COURSE:  1. Vomiting.  During hospitalization patient was seen by      gastroenterologist and had EGD done by Dr. Lina Sar with the      conclusion showing Barrett's esophagus, established proximal margin      30 cm from mouth, Z line 30 cm from mouth, LES 32 cm.  Length of      Barrett's 2 cm.  Biopsy Barrett's taken.  The antrum is normal.      The duodenal apex is normal.  Hiatal hernia, with distance 35 cm      from mouth, Z line/GE junction 30 cm from mouth, 3 cm in length.  A      2 cm Barrett's esophagus described previously by Dr. Russella Dar.  Now      also esophagitis with erosion and exudates.  Biopsy/hiatal hernia      taken.  Other findings, free reflux of gastric contents in the      distal esophagus.  The recommended plan after the EGD is 1)  Await      biopsy.  2) PPI pantoprazole/Protonix 4 mg b.i.d. .reommended      manometry/ upper  GI series to assess if candidate for Nissen      fundoplication.  The patient also was followed through by GI and      latest recommendation is to discharge the patient with Reglan 10 mg      q.6 hours.  The patient was recommended, as discussed with him and      his father, to followup with Dr. Russella Dar for further management and      assessment of his Barrett's esophagus and also the assessment  Nissen fundoplication.  The patient is now tolerating food fine.      He does not have episodes of vomiting and no pain and he is stable      for discharge in regards of his presenting symptoms the vomiting.  2. During hospitalization patient to follow up pneumonia that needs to      be evaluated by pulmonary and critical care team.  Patient was      started on vancomycin and he was on Primaxin as well sometimes as      well as Avelox.  Recently the patient was just continued on      vancomycin and he is stable and he will be discharged for this      pneumonia  on Augmentin 875 mg p.o. twice a day for 7 more days.   HOSPITAL CONSULTATION:  1. Patient was seen by Dr. Juanda Chance for GI.  2. Patient was seen by pulmonary and critical care team here at [facility] for his desaturation and severe pneumonia.   During hospitalization patient had a PIC line that is going to be  removed prior to his discharge.   DISCHARGE DIAGNOSES:  1. Nausea and vomiting with esophageal disease including Barrett's      esophageal hiatal hernia and esophagitis.  2. Bilateral pneumonia with desaturation.  3. 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  4. Seizure disorder.  5. Hypothyroidism.  6. Acute renal failure.  7. Code status DNR.   DISCHARGE MEDICATIONS:  1. Aspirin 325 mg p.o. daily.  2. Insulin sliding scale.  3. Lamictal 75 mg p.o. daily.  4. Synthroid 75 mg p.o. daily.  5. Reglan 10 mg p.o. q.6 hours scheduled dose.  6. Nystatin topical application twice a day.  7. Protonix 40 mg p.o. q.12 hours, i.e. twice a day.  8. Augmentin 875 mg p.o. b.i.d. for 7 days.  9. Seroquel 150 mg p.o. at night.  10.Senokot 1 to 2 tabs at night for constipation p.r.n.  11.Valproic acid/Depakote 500 mg p.o. q.8 hours.   DISCHARGE PLAN:  Patient will be discharged to followup with his   gastroenterologist, Dr. Russella Dar, for his Barrett's esophagus.   CONDITION ON DISCHARGE:  The case is discussed with patient and  discussed with the patient's father.  The patient will be discharged in  regular dose ie scheduled dose of Reglan 10 mg 4 times a day.  He will  also be discharged Protonix 40 mg twice a day.  Patient and patient's  father agree and aware of the discharge planning.   Code status.  The patient has Verlee Monte is the power of attorney she wanted  to patient to be DNR.      Beckey Rutter, MD  Electronically Signed     EME/MEDQ  D:  03/28/2007  T:  03/28/2007  Job:  [id number]

## 2011-05-11 NOTE — Discharge Summary (Signed)
NAME:  [name], [name]                    ACCOUNT NO.:  [id number]   MEDICAL RECORD NO.:  [id number]                   PATIENT TYPE:  IPS   LOCATION:  4028                                 FACILITY:  [facility]   PHYSICIAN:  Sejla Marzano DICTATOR                    DATE OF BIRTH:  04/23/1956   DATE OF ADMISSION:  06/19/2002  DATE OF DISCHARGE:  07/23/2002                                 DISCHARGE SUMMARY   DISCHARGE DIAGNOSES:  1. A 2 cm third ventricle mass status post bilateral frontoparietal     craniotomy with resection June 18 and reexploration June 18, evacuation     of right frontal hematoma.  2. Seizure disorder.  3. Pneumonia, resolved.  4. Gastroesophageal reflux disease with nausea/vomiting, resolved.  5. Schizophrenia/depression.   HISTORY OF PRESENT ILLNESS:  This is a 55 year old black male with history  of schizophrenia, severe gastroesophageal reflux disease who is admitted  June 18 with headache and altered mental status.  On evaluation cranial CT  scan showed a 2 cm third ventricle mass.  Underwent bilateral frontoparietal  craniotomy and resection of third ventricle tumor June 18 per Dr. Jordan Likes.  Postoperative left hemiparesis and noted seizure.  Follow-up cranial CT scan  with right frontal hematoma.  Underwent reexploration June 18 with  evacuation of hematoma and debridement of venous infarction.  Maintained on  mechanical ventilator.  Critical care management Dr. Jayme Cloud.  The patient  received a Decadron taper and maintained on Dilantin for seizure history.   HOSPITAL COURSE:  Speech therapy follow-up.  Placed on a mechanical soft  diet.  He spiked a fever with chest x-ray June 19 showing new right based  atelectasis.  Placed on intravenous Rocephin June 19 and remained stable.  He was requiring total assist for bed mobility, poor inattention to the left  side.   Latest laboratories showed Dilantin level 21.4, latest hemoglobin 11.6,  chemistries unremarkable.   Chest x-ray June 27 showing no significant  change.  He was admitted for comprehensive rehabilitation program.   PAST MEDICAL HISTORY:  Schizophrenia with depression, gastroesophageal  reflux disease with large hiatal hernia followed by Dr. Russella Dar.   PAST SURGICAL HISTORY:  None.   SOCIAL HISTORY:  Denies alcohol or tobacco.  His primary M.D. is Dr.  Audria Nine.   ALLERGIES:  None.   MEDICATIONS:  1. Reglan 10 mg t.i.d.  2. Protonix 40 mg daily.  3. Vitamin E daily.  4. Seroquel 300 mg at bedtime.   SOCIAL HISTORY:  He lives with his father in [place].  Independent prior  to admission.  He is on disability.  They live in a trilevel home with five  to six steps to entry.  Bedroom is upstairs.  Father and family can assist  as needed on discharge.   HOSPITAL COURSE:  The patient did well while in rehabilitation services with  therapies initiated on a b.i.d. basis.  The  following issues were followed  during patient's rehabilitation course.  Pertaining to [name]'s 2 cm  third ventricle mass with bilateral frontoparietal craniotomy and resection  on June 18, remained medically stable and followed by neurosurgery, Dr.  Jordan Likes.  Overall his endurance and strength continued to improve.  His  attention to the left side noted dramatic improvement.  He was ambulating  with contact guard assistance.  He received follow-up serial cranial CT  scans, latest being July 01, 2002 showing decrease in the postoperative  edema.  No other new abnormalities were noted.  His blood pressures remained  controlled without the use of medication.  He continued on Dilantin for  noted history of seizure after his initial surgery.  His latest Dilantin  level which was checked routinely was 10.4 on July 17, 2002.  He did receive  a 10 day course of intravenous Rocephin for pneumonia.  Follow-up chest x-  ray was negative.  He remained clear to auscultation for his lungs and  afebrile.  He did have bouts  of ongoing nausea/vomiting with a strong  history of gastroesophageal reflux disease with large hiatal hernia followed  by Dr. Russella Dar in the past.  He received follow-up KUBs after initial bouts of  nausea/vomiting, latest showing a nonspecific dilation of large and small  bowel loops representing a nonobstructive ileus.  His diet was restricted.  He was initially made n.p.o., placed on intravenous fluids, receiving IV  Reglan and Protonix.  His diet was steadily advanced as tolerated.  His  nausea and vomiting had subsided.  His Reglan was slowly tapered in attempts  to wean him from this medication July 23; however, patient did again exhibit  nausea/vomiting and his Reglan was resumed.  Nausea and vomiting had  resolved.  His chemistries were monitored and all remained within a normal  range.  He would follow up with Dr. Russella Dar as needed from gastroenterology  services.  Overall for his functional mobility with family teaching ongoing  with strong family support for patient for return home, he was now  ambulating contact guard assistance, needing some queuing, minimal assist  with queuing for his activities of daily living.  Home health therapies had  been arranged.  The patient continued to be followed upon his discharge.  He  had no bowel or bladder disturbances with routine offering of urinal as  needed.  He was followed by Dr. Leonides Cave of neuropsychology.  The patient  appeared to express no worries or concern about his discharge.  Family  remained a strong support for patient.  [facility] would continue to  follow patient as needed.  He was provided with a rolling walker, a 3-in-1  commode, and a tub seat.   Latest laboratories showed sodium 142, potassium 3.2, BUN 8, creatinine 0.9,  hemoglobin 11.6.   DISCHARGE MEDICATIONS:  1. Dilantin 300 mg at bedtime.  2. Seroquel 300 mg at bedtime.  3. Protonix 40 mg b.i.d. 4. Reglan 10 mg t.i.d.  5. Tylenol as needed.   ACTIVITY:   As tolerated.   DIET:  As tolerated.   SPECIAL INSTRUCTIONS:  Home health physical and occupational therapy.  Supervision provided by family.    FOLLOW UP:  The patient should follow up with Dr. Jordan Likes [phone]  neurosurgery, Dr. Russella Dar gastroenterology services as needed.  He will  continue to be followed in the outpatient rehabilitation center by Dr.  Faith Rogue.  Mckaela Howley DICTATOR    DD/MEDQ  D:  07/23/2002  T:  07/29/2002  Job:  [id number]   cc:   Henry A. Pool, M.D.   Maurice March, M.D.   Venita Lick. Pleas Koch., M.D. [facility]

## 2011-05-11 NOTE — H&P (Signed)
NAME:  [name], [name] NO.:  [id number]   MEDICAL RECORD NO.:  [id number]          PATIENT TYPE:  EMS   LOCATION:  ED                           FACILITY:  [facility]   PHYSICIAN:  Madaline Savage, MD        DATE OF BIRTH:  04/28/1956   DATE OF ADMISSION:  03/20/2007  DATE OF DISCHARGE:                              HISTORY & PHYSICAL   PRIMARY CARE [facility]:  [facility].   CHIEF COMPLAINT:  Vomiting.   HISTORY OF PRESENT ILLNESS:  [name] is a 55 year old resident of  [facility], who was transferred here as he had multiple episodes  of vomiting today.  He has a past medical history significant for  craniotomy for third ventricle neoplasm which was done in 2003.  He has  some cognitive impairment secondary to his multiple cranial surgeries.  He also has a history of schizophrenia.  He was apparently doing well  until yesterday, when he started vomiting multiple times.  His vomitus  did not contain any blood.  He was also noted to have a rattly kind of  cough and he was also found to be febrile.  The patient is minimally  verbal and denies any discomfort at this point in time.  No other  complaints.   PAST MEDICAL HISTORY:  1. History of craniotomy for third ventricle neoplasm, which was done      in 2003, status post cognitive impairment secondary to this      craniotomy.  2. Schizophrenia.  3. Barrett's esophagus.  4. History of ileus in the past with tendency to get constipated, on      chronic laxatives.  5. Depression.  6. Seizure disorder.  7. Hypothyroidism.  8. Hypertension.  9. Severe gastroesophageal reflux disease.   PAST SURGICAL HISTORY:  Craniotomy and resection of the third ventricle  neoplasm in 2003.   ALLERGIES:  No known drug allergies.   CURRENT MEDICATIONS:  1. Reglan 5 mg q.a.c. and h.s.  2. Protonix 40 mg twice daily.  3. MiraLax 17 g daily.  4. Dulcolax suppository as needed.  5. Aspirin 81 mg daily.  6. Depakote  750 mg twice daily.  7. Lamictal 75 mg daily.  8. Levothyroxine 75 mcg daily.  9. Seroquel 150 mg at bedtime.  10.Phenergan 25 mg every 6 hours as needed.  11.Tylenol 650 mg every 4-6 hours as needed.  12.Multivitamin tablet daily.   SOCIAL HISTORY:  He is a resident or [facility].  No history of  smoking, alcohol or drug abuse.   FAMILY HISTORY:  Mother had breast cancer.   REVIEW OF SYSTEMS:  GENERAL:  He denies any recent weight loss or weight  gain.  No fever or chills.  HEENT:  No headaches.  No blurred vision.  CARDIOVASCULAR:  Denies chest pain.  RESPIRATORY:  Denies any shortness  of breath, but does have a cough.  GI:  Mild epigastric pain and nausea.  Denies diarrhea.   PHYSICAL EXAMINATION:  GENERAL:  He is alert.  VITALS:  Temperature is 102.1, heart rate of 101 per minute, blood  pressure  123/72 and oxygen saturation 92% on 6 L.  HEENT:  Head atraumatic, normocephalic.  Pupils bilaterally equal, round  and reactive to light.  Mucous membranes moist.  NECK:  Supple with no JVD and no carotid bruit.  CARDIOVASCULAR:  S1 and S2 heard, regular rate and rhythm.  No murmurs,  rubs, or gallops.  CHEST:  Clear to auscultation.  ABDOMEN:  Soft.  Bowel sounds heard.  EXTREMITIES:  No edema, cyanosis or clubbing.   LABORATORY AND ACCESSORY CLINICAL DATA:  Labs show hemoglobin of 14.1,  hematocrit of 44, white count of 6.5, platelets of 163,000.  Sodium is  137, potassium 4.5, chloride 101, bicarb of 26, BUN of 21, creatinine of  1.82, glucose of 155.   An abdominal series x-ray was done which showed mild ileus and bilateral  infiltrates in lungs.   IMPRESSION:  1. Mild ileus.  2. Bilateral pneumonia with fever.  3. Schizophrenia.  4. Seizure disorder.  5. Hypothyroidism.  6. Code status is do not resuscitate.   PLAN:  1. Mild ileus:  This is a 55 year old gentleman who comes in with      vomiting.  Abdominal series shows mild ileus.  As per his family,       he does have a tendency to have ileus and has had vomiting in the      past secondary to it.  He is on stool softeners chronically because      of his trouble with constipation.  I will put him on a clear liquid      diet at this point in time.  He has improved since he has come into      the hospital.  If he continues to vomit, I will put an NG tube in      him and we will keep him n.p.o.  If he improves with the clear      liquid diet, we can advance diet as tolerated.  2. Bilateral pneumonia:  He does have a bilateral pneumonia on his x-      ray and he does have a fever; I suspect there is component of      aspiration to it.  I will start him on Avelox IV at this point in      time.  3. Seizure disorder:  We will resume his home dose of Depakote and      Lamictal.  4. Hypothyroidism:  We will continue the levothyroxine.  We will also      check a TSH.  5. Acute renal failure:  He does have an elevated creatinine of 1.82,      which is likely prerenal secondary to his vomiting.  We will      continue with IV fluids.  Check labs in the morning.  6. Code status:  I discussed it with his daughter, who is the power of      attorney; she does not want him to be resuscitated, so I will sign      the chart for a do-not-resuscitate order.  7. I will continue him on a proton pump inhibitor twice daily as      before and also put him on DVT prophylaxis.      Madaline Savage, MD  Electronically Signed     PKN/MEDQ  D:  03/20/2007  T:  03/20/2007  Job:  [id number]

## 2011-05-11 NOTE — Discharge Summary (Signed)
NAME:  ATHOL, BOLDS NO.:  [id number]   MEDICAL RECORD NO.:  [id number]          PATIENT TYPE:  INP   LOCATION:  6735                         FACILITY:  [facility]   PHYSICIAN:  Michaelyn Barter, M.D. DATE OF BIRTH:  30-Mar-1956   DATE OF ADMISSION:  01/14/2006  DATE OF DISCHARGE:  01/29/2006                                 DISCHARGE SUMMARY   Please refer to the discharge summaries dictated by Dr. Lonia Blood on  January 20, 2006 and by Dr. Elliot Cousin on January 25, 2006.  This  dictation follows their dictations.  Likewise, for the final diagnoses,  please also refer to their dictations for more thorough diagnoses.   FINAL DIAGNOSES:  1.  Constipation.  2.  Chest pain.  3.  Aspiration pneumonia.   SECONDARY DIAGNOSES:  1.  History of seizure disorder.  2.  History of schizophrenia.   This discharge summary outlines the events that have occurred over the  course of January 28, 2006 through January 29, 2006.  I have followed the  patient over these past two days only.  Again, please refer to the earlier  dictations done by Dr. Mikeal Hawthorne and Dr. Sherrie Mustache.  Over the past two days with  regards to #1, constipation.  There was concern that the patient had not had  a bowel movement for several days.  I ordered abdominal x-rays on February 5  to evaluate this.  Final impression was that the bowel gas pattern is normal  without evidence of ileus or obstruction.  No worrisome calcifications of  soft tissue findings.  The final impression was that the patient's abdominal  x-ray was unremarkable.  I also provided the patient with an enema and also  a dose of lactulose.  I have been told this morning by the nurses that the  patient did produce a large bowel movement yesterday.  Therefore, this issue  of constipation appears to have resolved.   Number two is chest pain.  The patient complained of some chest discomfort  on January 28, 2006.  He had cardiac enzymes completed.   His troponin I was  0.01 x 3, ruling him out for an acute coronary syndrome.  Likewise, the EKG  was nonimpressive.  Since that event, the patient has not complained of any  chest pain or discomfort to me.   Number three is aspiration pneumonia.  The patient was previously treated  for this with IV antibiotics.  Over the past two days that I have seen the  patient, he has not displayed any symptoms related to respiratory infection.  He has been afebrile over the past two days.  He has not complained of any  cough and actually the patient appears to be very comfortable; he, himself,  asking when will he be able to be discharged.   Condition at the time of discharge appears to be stable.  The patient's  vitals today:  His temperature is 97.8, heart rate 71, respirations 20,  blood pressure 108/66 and he is saturating 93% on room air.   The patient will be discharged home on the following medications:  1.  Aspirin 81 mg p.o. daily.  2.  Depakote 750 mg p.o. b.i.d.  3.  Colace 100 mg p.o. b.i.d.  4.  Floraquin one capsule daily.  5.  Lamictal 75 daily.  6.  Levothyroxine 75 mcg p.o. daily.  7.  Reglan 5 mg p.o. a.c. and h.s.  8.  Protonix 40 mg p.o. b.i.d.  9.  Seroquel 300 mg p.o. q.h.s.  10. Senokot one tablet p.o. q.h.s.      Michaelyn Barter, M.D.  Electronically Signed     OR/MEDQ  D:  01/29/2006  T:  01/29/2006  Job:  [id number]

## 2011-05-11 NOTE — Discharge Summary (Signed)
[name], [name]          ACCOUNT NO.:  [id number]   MEDICAL RECORD NO.:  [id number]          PATIENT TYPE:  INP   LOCATION:  2903                         FACILITY:  [facility]   PHYSICIAN:  Lonia Blood, M.D.      DATE OF BIRTH:  07/03/1956   DATE OF ADMISSION:  01/14/2006  DATE OF DISCHARGE:                                 DISCHARGE SUMMARY   INTERIM DISCHARGE SUMMARY.   DATE OF DISCHARGE:  To be determined later.   DISCHARGE DIAGNOSES:  1.  Persistent ileus of unknown cause.  2.  Gastrointestinal bleed.  3.  Anemia.  4.  Aspiration pneumonia.  5.  Gastroesophageal reflux disease.  6.  Hypokalemia.  7.  Transient hypocalcemia.  8.  Hypertension.  9.  Seizure disorder.  10. History of depression and schizophrenia.  11. Recent paraventricular central nervous system mass.  12. Status post bilateral frontoparietal craniotomy with resection.  13. Re-exploration for right frontal hematoma in June 2003.  14. Hypothyroidism.   DISCHARGE MEDICATIONS:  1.  The patient currently on Synthroid 75 mcg daily.  2.  Lipitor 10 mg daily.  3.  Lamictal 75 mg daily.  4.  Prilosec over-the-counter 20 mg twice a day.  5.  Depakote 750 mg twice daily.  6.  Fish oil 2 gm twice daily.  7.  Seroquel 600 mg nightly.  These medicines could change at the time of discharge, and currently  substituted in the hospital due to ileus.   DISPOSITION:  The plan is for the patient to go back to his nursing home  once his issues are resolved.  At this time the patient's ileus has not  resolved, and he is also having GI bleed that will need to be worked up  prior to discharge.   PROCEDURES PERFORMED SO FAR:  1.  Chest x-ray performed on January 14, 2006 showed chronic peribronchitic      changes in the lower lung zones.  2.  Abdominal pain x-ray, also, on January 14, 2006 showed marked gastric      distention with ileus.  Another chest x-ray on January 15, 2006 showed a      right PIC line that was  inserted correctly.  There was persistent dense      atelectasis or pneumonia in the lower lobes unchanged since the 22nd.      Insertion of NG-tube with followup chest x-ray on 01/15/2006 shows NG-      tube coiled in hiatal hernia with tip in the body of the stomach.      Another abdominal film on January 16, 2006 shows decompression of the      stomach after NG-tube, but no acute abdominal abnormalities.  Another      abdominal film on January 17, 2006 showed decreased gaseous distention      of the small bowel. Another one on January 17, 2006 showed Ileus not      visualized.  On January 18, 2006 another film showed NG-tube in good      position, but persistent but improved bibasilar atelectasis.  Another      chest x-ray  on January 19, 2006 shows progressive consolidation on the      left lower lobe with probably left effusion, less new right basilar      atelectasis.  An abdominal film, on the same day, also showed stable      mild ileus with marked distention of the small bowel.  Consultations Dr.      Sabino Gasser in [facility].   BRIEF HISTORY AND PHYSICAL:  Please refer to dictated history and physical  by Dr. Jetty Duhamel.  In short, however, the patient is a 55 year old  patient who lives in a nursing home.  He came in secondary intractable  nausea and vomiting.  The patient recently had intracranial hemorrhage.  He  had multiple craniotomies as a result. He was unable to give a history but  not so helpful at the time; the patient was continuously vomiting and  complaining of abdominal pain.  His abdomen was found to be very markedly  distended.  The patient keeps saying I'm fine and not complaining of any  symptoms at the time.  History was, therefore, gleaned mostly from family  members and previous records. His KUB showed evidence of ileus in the  emergency room.  The patient was also tachypneic with a heart rate of 135,  respiratory rate of 26; but he was saturating 96% on  2 liters. He was  subsequently admitted with ongoing diagnosis of ileus with concomitant  aspiration pneumonia.   HOSPITAL COURSE BY PROBLEM LIST:  Problem #1:  ILEUS.  The patient was  admitted, NG-tube was inserted and IV fluids started.  On the day of  admission the patient started having increasing respiratory rate into the  40s and his heart rate into the 140s.  He was subsequently found to be in  sinus tachycardia mainly.  Further aspiration was therefore suspected.  The  patient was continued on the NG-tube on IV fluids.  Twice, however, his NG-  tube has been clamped, but the patient continued to have persistent ileus  with vomiting anytime he tried clear liquids; hence, he still has NG-tube in  place; and his ileus is still not fully resolved.  We will continue this  measure until it is completely resolved prior to his discharge.   Problem #2:  ASPIRATION PNEUMONIA.  As indicated the patient has had this  aspiration pneumonia from day 1.  He has been on Zosyn up until January 27  when he spiked fever, again.  A followup chest x-ray showed another new  infiltrate.  Currently he is on Vancomycin and Zosyn.   Problem #3:  GERD.  The patient has been on TPI b.i.d. given IV while here  in the hospital.  He was on PPI in the nursing home prior to admission as  well.   Problem #4:  GI BLEED.  The patient's hemoglobin was more than 11 on  admission. It was 16.4 on admission and then started falling.  By the next  day his hemoglobin was 13.3; and subsequently dropped to 9.3 on January 16, 2006.  He received two units of packed red blood cells; and his hemoglobin  rose to 11.1; and stayed there, but then started dropping back again.  His  stool guaiac was checked and he was guaiac positive indicating that this is  probably a component of GI bleed, not just hemodilution.  We have subsequently consulted GI.  At this point, the plan is to do an EGD  colonoscopy when the  patient is most  stable.   Problem #5:  HYPOKALEMIA.  The patient had an NG-tube in place and  presumably with continuous aspiration his potassium has dropped as low as  2.5.  We are currently replacing his potassium.   Problem #6:  HYPOCALCEMIA.  The patient's calcium is 5.4 today.  His albumin  level is not currently available and may be corrected; however, with this  very low, we will replace that check iodinized calcium and try and correct  it prior to discharge.   Problem #7:  HYPOTENSION.  His blood pressure has fluctuated, currently on  the low side, most likely secondary to decreased intake. We are increasing  his IV fluids to stabilize his blood pressure.   Problem #8:  SEIZURE DISORDER.  The patient was put on IV Keppra while NPO.  He is still currently on that. No evidence of seizure at this point.  At the  time of discharge we will transition him back to his home medications.   Problem #9:  SCHIZOPHRENIA AND DEPRESSION.  This seems to be stable.  The  patient barely talks.  Further, this will be provided at discharge.      Lonia Blood, M.D.  Electronically Signed     LG/MEDQ  D:  01/20/2006  T:  01/21/2006  Job:  [id number]

## 2011-05-11 NOTE — Discharge Summary (Signed)
NAME:  Gerald, [name] NO.:  [id number]   MEDICAL RECORD NO.:  [id number]          PATIENT TYPE:  INP   LOCATION:  6735                         FACILITY:  [facility]   PHYSICIAN:  Elliot Cousin, M.D.    DATE OF BIRTH:  05-02-1956   DATE OF ADMISSION:  01/14/2006  DATE OF DISCHARGE:  01/26/2006                           DISCHARGE SUMMARY - REFERRING   ADDENDUM:   DISCHARGE DIAGNOSES:  1.  Severe gastroesophageal reflux esophagitis/Barrett's esophagitis per      esophagogastroduodenoscopy by Dr. Lina Sar on January 22, 2006.  2.  Large hiatal hernia, per esophagogastroduodenoscopy, by Dr. Juanda Chance on      January 22, 2006.  3.  Diverticulosis, per colonoscopy by Dr. Claudette Head in August 2005.  4.  Esophageal manometry and gastric emptying study, results are currently      pending during this hospitalization.  5.  Anemia secondary to chronic gastrointestinal blood loss, status post two      units of packed red blood cells during this hospitalization.  6.  Aspiration pneumonia, treated with Zosyn and vancomycin.  7.  Ileus, totally resolved during the hospital course.   SECONDARY DIAGNOSES:  1.  History of hypokalemia.  2.  Transient hypokalemia.  3.  Hyperlipidemia.  4.  Seizure disorder.  5.  History of depression with schizophrenia.  6.  Recent paraventricular central nervous system mass.  7.  Status post bilateral frontoparietal craniotomy with resection in the      past.  8.  Status post re-exploration for right frontal hematoma in June 2003.  9.  Hypothyroidism.   DISCHARGE DIAGNOSES:  MEDICATIONS:  1.  Protonix 40 mg one tablet b.i.d.  2.  Reglan 5 mg one tablet before each meal and at bedtime (discontinue if      the patient demonstrates symptoms such as jerking and lip smacking,      etc).  3.  Multivitamin with iron  4.  Synthroid 75 mcg daily.  5.  Lipitor 10 mg q.h.s.  6.  Lamictal 75 mg daily.  7.  Depakote 750 mg b.i.d.  8.  Seroquel 600 mg  q.h.s.  9.  Phenergan 25 mg p.o./p.r. q.4h. p.r.n. nausea and vomiting.  10.Tylenol 650 mg p.o./p.r. q.4h. p.r.n. pain and fever.  1.  Ensure, 1 can t.i.d.   CONSULTATIONS:  Dr. Lina Sar.   PROCEDURE PERFORMED:  1.  EGD per Dr. Juanda Chance on January 22, 2006. The results revealed hiatal      hernia, Barrett's esophagitis, severe gastroesophageal reflux.  2.  Esophageal manometry, results pending.  3.  Gastric studying, results pending.   Please see the dictated discharge summary per Dr. Mikeal Hawthorne on January 20, 2006.   This is an addendum discharge summary.   HOSPITAL COURSE:  Problem #1:  SEVERE GASTROESOPHAGEAL REFLUX  ESOPHAGITIS/BARRETT'S ESOPHAGITIS/HIATAL HERNIA. The patient continued to  have recurrent nausea, vomiting, and regurgitation during the hospital  course. Dr. Juanda Chance was therefore consulted for further evaluation and  management. On January 22, 2006, Dr. Juanda Chance performed an EGD. The EGD in  essence revealed Barrett's esophagitis with erosions and exudate, free  reflux of gastric contents  into the distal esophagus, and a large hiatal  hernia. Biopsies were taken and results are currently pending. Given these  findings, it was felt that the patient's nausea and vomiting were more than  likely regurgitation of gastric contents back into the esophagus. Dr. Juanda Chance  felt that the patient could be a possible candidate for surgical correction  with a fundoplication.  However, if the patient has evidence of  gastroparesis or delayed emptying, he would not be a surgical candidate.  Therefore, a gastric emptying study and an esophageal manometry were both  ordered by Dr. Juanda Chance. The results are currently pending. The patient will  need to follow up with Dr. Claudette Head, his primary gastroenterologist, on  February 12, 2006, at 11:15 a.m. The decision regarding a possible  fundoplication will be discussed with the patient and the patient's family  once all of the results are  available.   At this time, the patient is being treated with b.i.d. dosing of Protonix. A  small dose of Reglan has been restarted at 5 mg q.a.c. and q.h.s.; however,  given his history of schizophrenia, the Reglan can be discontinued if the  patient demonstrates behavioral disturbances and/or signs consistent with  tardive dyskinesia. His diet will be restarted with full liquids with Ensure  at each meal. His diet can be further advanced as tolerated. The patient  will probably have some element of regurgitation chronically, however for  now, the medical management will be optimized pending possible surgical  correction of the severe esophagitis/reflux. Of note, when the patient was  given Reglan early in the hospital course, he did develop some loose stools.  When the Reglan was temporarily discontinued, the diarrhea resolved. The  patient will be discharged on a much lower dose than was given during the  hospitalization.   Problem #2:  ANEMIA SECONDARY TO CHRONIC GI BLOOD LOSS.  As previously  dictated by Dr. Mikeal Hawthorne, the patient was transfused two units of packed red  blood cells during the hospitalization. At least one stool was guaiac  positive. Given the patient's history of severe esophagitis and  diverticulosis, the most likely etiology of the patient's anemia is chronic  GI blood loss. In addition, the patient may have an element of anemia of  chronic disease as well as acute disease particularly during the hospital  course. At the time of hospital discharge, his hemoglobin stabilized at  11.3. He will be discharged to home on a multivitamin with iron once daily.   Problem #3:  ASPIRATION PNEUMONIA.  The patient was treated with Zosyn and  vancomycin for a total of nine days during the hospital course. A follow-up  chest x-ray on January 21, 2006, revealed improving bibasilar infiltrative changes. The antibiotics were completely discontinued at the time of  hospital discharge.  The patient is currently afebrile and his white blood  cell count is within normal limits.   Problem #4:  ILEUS. The patient's ileus completely resolved clinically and  radiographically during the hospital course.   Problem #5:  SCHIZOPHRENIA WITH DEPRESSION. The patient demonstrated no  evidence of delusions during the hospital course. He was started on Zyprexa  during the hospital course. However, the patient chronically takes Seroquel  600 mg at bedtime. At the time of hospital discharge, the Seroquel will be  restarted and the Zyprexa will be discontinued.   Problem #6:  SEIZURE DISORDER. The patient had no seizures during the  hospital course. He was initially started on Keppra IV when  he was NPO.  However, once the patient began eating again, the Keppra was discontinued  and the patient was restarted on Depakote 750 mg b.i.d.   Problem #7:  NUTRITION.  The patient's diet has been advanced from a clear  liquid diet to a full liquid diet. Ensure, one can with each meal, has been  ordered. His diet can be advanced to soft solid foods as tolerated.   DISCHARGE DISPOSITION:  The patient is currently stable for hospital  discharge. The plan is to discharge the patient back to the assisted living  section of [facility]. The patient's sister and power-of-attorney, Ms.  Benton, have some concerns about the patient returning to [place]  on the weekend. He is currently stable for hospital discharge.  THE PATIENT  HAS A HOSPITAL FOLLOW-UP APPOINTMENT WITH DR. MALCOLM STARK ON February 12, 2006, AT 11:15 A.M. The patient will need to follow up with Dr. Russella Dar as  scheduled to discuss the results of all these studies currently pending.   DISCHARGE LABORATORIES:  Sodium 142, potassium 3.8,  chloride  104, CO2 26,  glucose 113, BUN 5, creatinine 1.7, total bilirubin 0.9, alkaline  phosphatase 51, SGOT 12, SGPT 10, total protein 5.3, albumin 2.1, calcium  8.1.  Blood cultures  negative times two.  WBC 9.8, hemoglobin 11.3,  hematocrit 33.1, platelet count 331,000.      Elliot Cousin, M.D.  Electronically Signed     DF/MEDQ  D:  01/25/2006  T:  01/25/2006  Job:  [id number]   cc:   Maxwell Caul, M.D.  Fax: [phone]   Venita Lick. Russella Dar, M.D. [facility]  [address]  [place]  [place] [address]   Lina Sar, M.D. [facility]  [address]  [place]  [place] [address]

## 2011-05-11 NOTE — Op Note (Signed)
NAME:  [name], [name] NO.:  [id number]   MEDICAL RECORD NO.:  [id number]          PATIENT TYPE:  INP   LOCATION:  6735                         FACILITY:  [facility]   PHYSICIAN:  Lina Sar, M.D. [facility]  DATE OF BIRTH:  [birth year]   DATE OF PROCEDURE:  DATE OF DISCHARGE:                                 OPERATIVE REPORT   PROCEDURE:  Esophageal manometry.   INDICATION:  This 55 year old white male has severe gastroesophageal reflux,  gastric ileus, failure of outpatient therapy. He is being evaluated for  possible Nissen fundoplication. On endoscopy the patient had a large hiatal  hernia, but free reflux. This was confirmed on barium swallow study which  showed decreased peristalsis and free reflux. His gastric emptying done  earlier today showed delayed gastric emptying.   RESULTS:  Manometric catheter passed through the nares into the stomach and  was positioned at 5 cm and 10 cm above the lower esophageal sphincter. The  lower esophageal sphincter proximal was located at 33.5 cm distally and 36.5  cm; total length of LES was 3 cm which is normal. The average lower  esophageal sphincter pressure was 7.3 mm which is abnormally low. Normal LES  pressure is 10 to 45 mmHg. Eleven wet swallows were reviewed and showed 19%  relaxation of the lower esophageal sphincter pressure.   Peristaltic pressure measured approximate in the distal esophagus. The  patient had abnormal motility showing only 67% of peristaltic contraction; 1  out of 12 contractions were simultaneous, 1 out of 12 contractions were not  transmitted, and 2 out of 12 were retrograde contractions. A total of  abnormal contractions were 4/12 that represent 33%. The patient has only 67%  normal contractions.   Upper esophageal sphincter pressure was normal, showed normal relaxation.   IMPRESSION:  This is an abnormal esophageal manometry showing abnormally low  lower esophageal sphincter pressure and abnormal  peristalsis showing  hypomotility consistent with nonspecific motility disorder.   PLAN:  Based on this assessment the patient would not be a good candidate.      Lina Sar, M.D. [facility]  Electronically Signed     DB/MEDQ  D:  01/25/2006  T:  01/25/2006  Job:  [id number]

## 2011-05-11 NOTE — H&P (Signed)
NAME:  [name], MAVIS NO.:  [id number]   MEDICAL RECORD NO.:  [id number]          PATIENT TYPE:  EMS   LOCATION:  MAJO                         FACILITY:  [facility]   PHYSICIAN:  Lonia Blood, M.D.DATE OF BIRTH:  November 23, 1956   DATE OF ADMISSION:  01/14/2006  DATE OF DISCHARGE:                                HISTORY & PHYSICAL   PRIMARY CARE PHYSICIAN:  Medical laboratory scientific officer.   CHIEF COMPLAINT:  Vomiting.   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  As a result, he is minimally verbal. He is able to speak normally but  simply responds with very short answers to questions and basically denies  any complaint that you may question him on.  His sister presents with him.  She provides the following history.  Mr. [name] began to experience  abdominal distention apparently 3 to 4 days ago.  Shortly thereafter, he  began to experience vomiting.  Initially this was small volume once or twice  a day, but over the past 36 hours, this has increased significantly.  The  patient did apparently intimate some abdominal pain to the family as well.  As a result, they requested he be transferred, and the patient was sent to  [facility] Emergency Room.   In the emergency room, a KUB was obtained which revealed marked distention  of the stomach and findings consistent with ileus.  At the time of my  evaluation, again, the patient does not provide any further history  whatsoever.  He states that he feels fine.   REVIEW OF SYSTEMS:  Comprehensive Review of Systems is unable to be  accomplished due to the details explained above.   PAST MEDICAL HISTORY:  (Per old records)  1.  Known gastroesophageal reflux disease with  large hiatal hernia and      Barrett's esophagus.  2.  Schizophrenia.  3.  Depression.  4.  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  5.  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  6.  Hypothyroidism.   NURSING HOME MEDICATIONS:  1.  Synthroid 75 mcg daily.  2.  Lipitor 10 mg daily.  3.  Lamictal 75 mg daily.  4.  Prilosec over-the-counter 20 mg twice daily.  5.  Depakote 750 mg twice daily.  6.  Fish oil 2 g twice daily.  7.  Seroquel 600 mg nightly.   ALLERGIES:  No known drug allergies.   DIET:  Mechanical soft.   FAMILY HISTORY:  The patient has a mother with breast cancer and a father  with coronary artery disease, but further details are not available at the  present.   SOCIAL HISTORY:  The patient is single.  He is disabled.  He does not smoke;  he does not drink.  He lives at [facility].   DATA REVIEWED:  White count is markedly elevated at 17.3 with a left shift.  Hemoglobin is 16.4 with MCV of 93.  Platelets are normal.  INR is normal.  The pH is 7.3 with PCO2 of 28 and no PO2 on venous gas.  Sodium and  potassium were normal.  Bicarb is low at 19.  BUN is elevated at 34,  creatinine 1.3.  Serum glucose is elevated at 130.   EKG reveals sinus tachycardia at 132 beats per minute but no acute ST or T  wave changes per interpretation of this MD.   Urinalysis reveals 100 of glucose, 15 of ketones, 30 of protein.   KUB reveals marked gastric distention and evidence of ileus.   Chest x-ray reveals dense left lower lobe infiltrate per review of this MD.   PHYSICAL EXAMINATION:  VITAL SIGNS: Temperature 98.6, blood pressure 145/98,  heart rate 135, respiratory rate 26, O2 saturation 96% on 2 liters per  minute nasal cannula.   GENERAL:  Well-developed, well-nourished male who favors his left side, who  does not appear to be in acute  respiratory distress at present.  HEENT:  Normocephalic and atraumatic.  Pupils equal, round, reactive to  light and accommodation though sluggishly so.  Oral cavity and oropharynx  clear.  NECK: No JVD, no lymphadenopathy.  LUNGS: Bibasilar crackles, greater on the left.  No wheeze.  Good air  movement throughout other fields.  CARDIOVASCULAR: Tachycardic at approximately 120 beats per minute. No  appreciable murmur, gallop, or rub.  Normal S1 and S2.  ABDOMEN:  Distended, tympanic, soft.  Bowel sounds are absent.  There is  tenderness to deep palpation diffusely.  There is no appreciable mass.  EXTREMITIES:  Trace bilateral lower extremity edema without erythema.  CUTANEOUS: No evidence of lower extremity decubiti or other lesions.  NEUROLOGIC: The patient is alert, but orientation is not able to be assessed  as the patient simply will not answer questions.  He moves all 4 extremities  spontaneously. The left upper extremity reveals significant decreased in  fine motor movement focused about the hand, but the patient is able to move  his arm and displays a 4 to 4-/5 strength in the left upper extremity.  There is 5+/5 strength throughout all other extremities.  There actually is  no Babinski.  OTHER: The patient does have an NG tube in place which is draining maroon to  coffee ground material.   IMPRESSION AND PLAN:  1.  Aspiration pneumonia: The patient has a clear left lower lobe dense      infiltrate. Given the clinical scenario, this is clearly an aspiration      event.  I am covering the patient empirically with Zosyn.  We will      follow serial exams and chest x-rays.  Oxygen will be administered as      needed to keep saturations at 90% or greater.  2.  Tachycardia: The patient is exhibiting a significant degree of sinus      tachycardia. This appears to be related to acute stress of his     aspiration pneumonia and resultant mild hypoxia. I will simply treat the      root  causes.  This is also clearly related to his significant      dehydration and probably also related to pain associated with his ileus.      Each of these issues will be addressed individually.  There is  no      evidence of an inappropriate arrhythmia at this time.  There is no      history to suggest possible coronary disease, and rule out is not      needed.  3.  Ileus: The patient is a high risk for recurrent ileus due to his      decreased mobility.  We will place a rectal tube and will continue NG      suctioning.  The patient will be placed on Reglan in attempt to      stimulate bowel movements.  We will maintain the patient n.p.o. and      convert all medications to IV form as best as we can.  4.  Coffee ground emesis, gastric suction: The material is being suctioned      from the patient is, in fact, consistent with coffee ground emesis.  Due      to the degree of the patient's dehydration, the actual status of his      hemoglobin cannot be accurately determined at this present time. We will      follow the CBC again in the morning.  Coags are normal.  It is my      anticipation that, with hydration, the patient's hemoglobin will drop      farther.  If we show a significant decrease below 12, we will consider      consultation with GI for reevaluation given the patient's known history      of large hiatal hernia and Barrett's esophagus.  5.  Schizophrenia with depression: The patient is treated with Seroquel in      the outpatient setting.  We are not able to give this p.o. as the      patient requires n.p.o. status, and absorption would be severely limited      if present at all in the patient's current status.  As a result, I am      substituting Zyprexa, and we will follow him closely.  6.  Seizure disorder: The patient typically uses Lamictal and Depakote.  I      am unable to use these medications as they are both p.o. I will use      Keppra in an attempt to control the  patient's seizure activity.  He will      be placed on seizure precautions.  If there is any evidence of seizure,      we will consider Dilantin load.  7.  Hypothyroidism: We will continue Synthroid via IV and dose it at half      the standing dose.  8.  Dehydration: The patient is clearly dehydrated and has likely not been      able to absorb much through his GI tract secondary to significant ileus      and backup. The patient will be hydrated via IV.  We may need to      consider PICC line and TNA if the patient's ileus does not resolve soon.      Lonia Blood, M.D.  Electronically Signed     JTM/MEDQ  D:  01/14/2006  T:  01/14/2006  Job:  [id number]   cc:   [facility]   Maxwell Caul, M.D.  Fax: [phone]

## 2011-05-11 NOTE — Consult Note (Signed)
NAME:  LEOCADIO, HEAL NO.:  [id number]   MEDICAL RECORD NO.:  [id number]          PATIENT TYPE:  INP   LOCATION:  2903                         FACILITY:  [facility]   PHYSICIAN:  Georgiana Spinner, M.D.    DATE OF BIRTH:  04-25-1956   DATE OF CONSULTATION:  01/19/2006  DATE OF DISCHARGE:                                   CONSULTATION   Mr. Demma is an unfortunate 55 year old gentleman who resides in a  nursing home who has had a craniotomy with subsequently intracranial  hemorrhage, leaving him minimally verbale.  History is obtained from the  admitting history and physical.  He was admitted because of abdominal  distention for three or four days prior to his admission and subsequently  vomited which became large volume vomitus.  Therefore he was transferred to  the emergency room for evaluation.  A KUB showed marked distention of the  stomach and he was admitted for decompression.  On comprehensive review of  systems, it was  unable to be accomplished due to the details of the  patient's history as noted.  He has had known gastroesophageal reflux  disease with a large hiatal hernia and apparently Barrett's esophagus has  been noted.  He has had a history of schizophrenia, depression,  periventricular CNS mass, status post bilateral frontoparietal craniotomy in  2003, seizure disorder after the surgery and history of hypothyroidism.   On admission, he was on Synthroid, Lipitor, Lamictal, Prilosec over-the-  counter 200 mg twice daily, Depakote, fish oil, Seroquel.   NO KNOWN DRUG ALLERGIES.   He was on a mechanical soft diet.   FAMILY HISTORY:  As obtained in the chart.   He is single, does not smoke, does not drink and I have been asked to see  him because of abdominal distention and apparently GI blood loss, which we  will described later.   PHYSICAL EXAMINATION:  He is alert and awake but does not really give much  in the way of history and cannot really  respond well.  VITAL SIGNS:  Temperature 99.0, blood pressure 110/75 to 80, respiratory  rate of 15 with 99% O2 saturation on two liters.  HEENT:  Without jaundice.  NECK:  There were no bruits appreciated in the neck.  LUNGS:  Clear.  CARDIOVASCULAR:  Regular rhythm without murmur, gallop or rub appreciated  with a rate being 88.  ABDOMEN:  The abdomen at this time is firm but not distended.  RECTAL:  Scan amount of white green to brown stool.  Of note, he had coffee-  ground emesis which is probably related to his ileus but he presented with  marked gastroparesis distension of the stomach which has been decompressed  with NG suction on most recent films.   He also is noted to have hemoglobin of 10.3 which was 11.1 yesterday.  He is  occult blood positive.  LFTs were unremarkable.  BUN was 34 on admission.  His prothrombin time was 15.3.   IMPRESSION:  Currently his abdomen appears to be less distended than  described.  X-rays seem to show decompression of previously noted distended  stomach and he does have Hemoccult positivity which may be related  to the gastroparetic stomach, but in any rate, consideration for endoscopic  evaluation will be raised if his abdomen remains decompressed.  Probably,  endoscopy, colonoscopy and give prep through the NG tube at present and will  follow.           ______________________________  Georgiana Spinner, M.D.     GMO/MEDQ  D:  01/19/2006  T:  01/20/2006  Job:  [id number]

## 2011-05-11 NOTE — Op Note (Signed)
NAME:  [name], [name]                 ACCOUNT NO.:  [id number]   MEDICAL RECORD NO.:  [id number]                   PATIENT TYPE:  EMS   LOCATION:  MAJO                                 FACILITY:  [facility]   PHYSICIAN:  Kathaleen Maser. Pool, M.D.                 DATE OF BIRTH:  [birth year]   DATE OF PROCEDURE:  DATE OF DISCHARGE:  09/02/2002                                 OPERATIVE REPORT   PREOPERATIVE DIAGNOSES:  1. 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  2. Postoperative hemorrhagic infarct with mass effect.   POSTOPERATIVE DIAGNOSES:  1. 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  2. Postoperative hemorrhagic infarct with mass effect.   OPERATION PERFORMED:  1. Craniotomy and resection of third ventricular neoplasm.  2. Craniotomy and evacuation of intracerebral hematoma.   SURGEON:  Kathaleen Maser. Pool, M.D.   ASSISTANT:   ANESTHESIA:   INDICATIONS FOR PROCEDURE:  The patient is a 55 year old black male who has  a history of chronic intermittent nausea and vomiting with severe  gastroesophageal reflux failing medical management.  The patient has  recently had an episode of prolonged protracted nausea and vomiting for no  apparent reason.  He has had evidence of bradycardia followed by extreme  hypertension.  Head CT scanning demonstrated a 2.5 cm third ventricular mass  consistent with a colloid cyst. He had evidence of mild to moderate  ventricular dilation.  We discussed options available for management and he  has decided to proceed with transcallosal resection of his tumor.   DESCRIPTION OF PROCEDURE:  The patient was taken to the operating room where  a craniotomy and transcallosal resection of his colloid cyst was performed.  Surgery was complicated by a small laceration of a branch vessel of a  callosal marginal artery.  This vessel was sacrificed in order to  obtain  hemostasis.  A number of small draining cortical veins were also taken in  order to obtain retraction.  These were all anterior to the coronal suture.  Postoperatively the patient awakened with a left-sided hemiparesis.  He  began experiencing declining mental status later on that evening.  Follow-up  head CT scan demonstrated evidence of a significant hemorrhagic infarct  involving his posterior right frontal lobe.  There was marked mass effect  with this.  This scan looked most consistent with venous infarction with  associated hemorrhage.  The patient was taken back to the operating room  emergently where his craniotomy was reopened and the hematoma was evacuated  as was any devitalized brain.  Postoperatively the patient responded well to  the surgery and his level of consciousness improved.  Unfortunately he  continued to display rather marked hemiparesis on the left side.  He was  able to speak and follow commands.  His wound was healing well.  The patient  was observed in the  ICU and serial scans were obtained.  He eventually began  to improve and he began having some spontaneous movement of his left upper  and lower extremity. H e was transferred to rehabilitation for later care.  While on the rehab service, his hemiparesis continued to improve markedly.    CONDITION ON DISCHARGE:  Stable.   DISPOSITION:  The patient is to be transferred to rehab.  I will continue to  follow the patient while on that service.                                                 Henry A. Pool, M.D.    HAP/MEDQ  D:  09/11/2002  T:  09/14/2002  Job:  [id number]

## 2011-05-11 NOTE — H&P (Signed)
NAME:  [name], [name] NO.:  [id number]   MEDICAL RECORD NO.:  [id number]          PATIENT TYPE:  INP   LOCATION:  0112                         [facility]ACILITY:  [facility]   PHYSICIAN:  Della Goo, M.D. DATE OF BIRTH:  08-08-1956   DATE OF ADMISSION:  04/22/2007  DATE OF DISCHARGE:                              HISTORY & PHYSICAL   PRIMARY CARE PHYSICIAN:  Immunologist Care, Dr. Hyacinth Meeker.   CHIEF COMPLAINT:  Vomiting.   HISTORY OF PRESENT ILLNESS:  This is a 55 year old male resident of the  [facility] who was brought to the emergency  department secondary to 4 episodes of vomiting, 2 episodes where he  vomited up coffee-ground emesis and 2 other episodes over the past 24  hours of foods-like substances.  The patient also reports having dark  stools x2 which were loose.  This is a readmission. The patient was  hospitalized recently and diagnosed with GI bleeding and Barrett's  esophagus and was to undergo a further GI evaluation as an outpatient  with Dr. Russella Dar of [place].  The patient denies being dizzy. reports  having substernal discomfort,  heartburn.  He denies being short of  breath.   PAST MEDICAL HISTORY:  Significant for:  1. Barrett's esophagus.  2. Recent upper GI bleeding.  3. Hyperthyroidism.  4. Hypertension.  5. Gastroesophageal reflux disease.  6. Schizophrenia.  7. Seizure disorder.  8. Status post craniotomy secondary to neoplastic process of the third      ventricle.   MEDICATIONS:  1. Aspirin 325 mg one p.o. daily.  2. Synthroid 75 mcg one p.o. daily.  3. Protonix 40 mg one p.o. b.i.d.  4. Depakote 500 mg one p.o. q.8 h.  5. Seroquel 150 mg one p.o. nightly.  6. Senokot 2 tablets p.o. daily.  7. Phenergan 25 mg p.o. p.r.n. nausea.  8. Lamictal 75 mg one p.o. daily.  9. Reglan 10 mg p.o. q.6 h.   ALLERGIES:  No known drug allergies.   SOCIAL HISTORY:  Nursing home resident, nonsmoker, nondrinker   FAMILY  HISTORY:  Noncontributory.   REVIEW OF SYSTEMS:  Pertinent for mentioned above.   PHYSICAL EXAMINATION:  GENERAL:  A 55 year old well-nourished, well-  developed male in discomfort but no acute distress.  VITAL SIGNS: Temperature 98.1, blood pressure 109/77, heart rate 74,  respirations 20, O2 saturations 98-100%.  HEENT: Normocephalic, atraumatic.  There is no scleral icterus.  Pupils  equally round reactive to light.  Extraocular muscles are intact.  Funduscopic benign. Oropharynx is clear.  NECK:  Supple with full range of motion.  No thyromegaly, adenopathy,  jugular venous distension.  CARDIOVASCULAR:  Regular rate and rhythm.  LUNGS: Clear to auscultation bilaterally.  ABDOMEN:  Positive bowel sounds, soft, nontender, nondistended.  EXTREMITIES: Without edema.  GENITOURINARY:  Deferred.  RECTAL: Examination performed by the emergency room department physician  heme positive.  NEUROLOGIC:  The patient is alert and oriented x3.  He is able to move  all four of his extremities.   LABORATORY STUDIES:  White blood cell count 10.4, hemoglobin 14.5,  hematocrit 42.8, MCV 91.1, platelets 209, neutrophils  77% lymphocytes  17%.  Sodium 144, potassium 4.2, chloride 102, carbon dioxide 30, BUN  16, creatinine 1.1 and glucose 110, calcium 9.9   ASSESSMENT:  A 55 year old male being readmitted with  1. Probable upper gastrointestinal bleeding, gastrointestinal      bleeding.  2. Barrett's esophagus, esophagitis.  3. Hypertension.  4. Seizure disorder   PLAN:  1. The patient will be admitted to the telemetry area for monitoring.  2. Hemoglobin and hematocrit levels will be checked q.12 h.  3. The patient will be placed on clear liquids and IV Protonix q.12 h.  4. Carafate liquid has also been ordered q.6 h.  5. Reglan IV has also been ordered as well.  6. [facility] will be notified of the patient's admission since he was      to have an outpatient evaluation by Dr. Russella Dar in the  morning.  7. Aspirin therapy has been held, and the patient will be placed on      DVT prophylaxis of TED hose to both lower extremities.  8. He will continue on his regular medications for now.      Della Goo, M.D.  Electronically Signed     HJ/MEDQ  D:  04/23/2007  T:  04/23/2007  Job:  [id number]

## 2011-05-11 NOTE — Op Note (Signed)
[place]. [facility]  Patient:    [name], [name] Visit Number: [id number] MRN: [id number]          Service Type: SUR Location: 3100 [id number] Attending Physician:  Donn Pierini Dictated by:   Julio Sicks, M.D. Proc. 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  PROCEDURE:  Bilateral frontoparietal craniotomy with transcallosal resection of third ventricular tumor.  SURGEON:  Julio Sicks, M.D.  ASSISTANT:  Reinaldo Meeker, M.D.  ANESTHESIA:  General endotracheal.  INDICATION:  [name] is a 55 year old male with a history of mental retardation, who has a long history of intermittent headaches, as well as chronic gastroesophageal reflux and spontaneous episodes of nausea and vomiting.  The patient has recently had a head CT scan and a follow-up MRI scan, which demonstrated a third ventricular mass within the roof of the third ventricle causing some mild to moderate ventricular dilation.  The mass itself is large at approximately 2 cm.  It compresses the foramen of Monro bilaterally.  The patient has been counselled as to his options and he has decided to proceed with craniotomy and resection of tumor.  OPERATIVE NOTE:  The patient was taken to the operating room, placed on operating table in a supine position.  After an adequate level of anesthesia was achieved, the patient was positioned supine with his head in a neutral head position with his back elevated.  The patients scalp was prepped and draped sterilely.  At this point, plans had been made to use the Stealth Stereotaxic foramen computer.  Unfortunately, part of the camera system of the Stealth stopped functioning right as the case was beginning to begin.  We had technical support from  [employer], as well as the Representative here.  We could not get the Stealth System working adequately.  As the patient was already anesthetized and the Stealth not felt to be absolutely critical to performance of the operation, it was decided to proceed with the operation. The scalp was incised in a modified horseshoe type fashion with the base on the right side as it elevated and retracted laterally.  A bifrontoparietal craniotomy was then performed using the high-speed drill.  A bone flap was elevated.  Gelfoam was placed over the superior sagittal sinus, which was not injured.  The dura was opened on the right side and hinged medially along the superior sagittal sinus.  Two small bridging cortical veins were taken along the way.  No large draining veins were resected.  The microscope was brought into the field and used for microdissection of the tumor.  Using the [facility] self-retaining retractor system, the right frontal lobe was gradually retracted laterally.  Dissection proceeded down the interhemispheric fissure. The cingulate gyrus was completely stuck to the contralateral side. Dissection was then made freeing up the arachnoid.  The corpus callosum was identified.  The pericallosal arteries were identified and a dissection plane along the corpus callosum was made in the midline.  This then entered into the body of the right lateral ventricle.  The retractor system was replaced.  The choroid and thalamostriate vein was identified, as was what appeared to be the foramen of Monro, but this was obscured by a midline  mass.  Around this time, a small side branch of the callosal marginal artery began to bleed.  This was difficult to control as it had essentially been avulsed from the main trunk of the artery.  This was eventually controlled using the bipolar electrocautery, but I am worried that this artery may have been sacrificed in the process. This was in fact unavoidable.  Attention  was placed back into the lateral ventricle.  The tela choroidea was identified superior to the choroid plexus. A dissection plane was made into the tela choroidea and the fornix was retracted toward the left.  A thin blue-walled, cystic structure came into view.  This was grasped with a pituitary microforcep.  The contents of the cyst were emptied by coagulating the cyst wall and sucking out the gelatinous colloid cyst type material.  There was no evidence of any old hemorrhage or other problems and this appeared to be uniformly mucinous in nature.  The cyst wall was dissected free from the roof of the third ventricle.  It was coagulated and resected completely.  All bleeding within the lateral ventricle was evacuated using gentle suction and irrigation.  Hemostasis was meticulous within the ventricle and there was no evidence of any active bleeding.  The retractor system was removed.  The fornix appeared intact as did other structures of the ventricles.  There was no evidence of bleeding from the branch of the anterior cerebral artery.  Surgicel was placed along the cingulate gyrus bilaterally.  The retractor system was removed.  The wound was irrigated with saline.  Hemostasis was once again found to be very good.  The dura was reapproximated with 4-0 Nurolon.  Gelfoam was placed over the craniotomy defect and the bone flap was replaces using four medium Osteomed plates.  A Jackson-Pratt drain was left in the subgaleal space.  The scalp was reapproximated with 2-0 Vicryl suture at the galea and staples at the surface.  The patient tolerated the procedure well and he returned to the recovery room in good condition. Dictated by:   Julio Sicks, M.D. Attending Physician:  Donn Pierini DD:  06/10/02 TD:  06/11/02 Job: [id number]

## 2011-05-11 NOTE — Discharge Summary (Signed)
[name], [name]          ACCOUNT NO.:  [id number]   MEDICAL RECORD NO.:  [id number]          PATIENT TYPE:  INP   LOCATION:  1424                         FACILITY[facility]  [facility]   PHYSICIAN:  Ladell Pier, M.D.   DATE OF BIRTH:  June 26, 1956   DATE OF ADMISSION:  04/22/2007  DATE OF DISCHARGE:  04/27/2007                               DISCHARGE SUMMARY   DISCHARGE DIAGNOSES:  1. Gastrointestinal bleed with anemia.  Upper endoscopy shows Barrett      esophagus.  Colonoscopy normal.  2. Barrett esophagus as seen on upper endoscopy.  3. Hyperthyroidism.  4. Hypertension.  5. Gastroesophageal reflux disease.  6. Schizophrenia.  7. Seizure disorder.  8. Status post craniotomy secondary to neoplastic process of the third      ventricle.  9. Anemia secondary to gastrointestinal bleed status post 2 units of      blood transfusion.   DISCHARGE MEDICATIONS:  Aspirin 81 mg daily.  Synthroid 75 mcg daily.  Protonix 40 mg twice daily.  Depakote 500 mg every 8 hours.  Seroquel  150 mg nightly.  Senokot 2 tabs daily.  Phenergan 25 mg p.o. q.6 hours  p.r.n. for nausea.  Lamictal 75 mg daily.  Reglan 10 mg p.o. q.6 hours.   FOLLOW-UP APPOINTMENTS:  Patient to follow up with GI, Dr. Russella Dar, at  [facility] and also with Dr. Hyacinth Meeker at [facility].   PROCEDURES:  Upper and lower endoscopy.   HPI:  Patient is a 55 year old man resident of [facility] brought into the emergency with 4 episodes of vomiting of coffee  ground emesis.  Patient also reported dark stools that were loose.  He  was recently admitted with GI bleed, Barrett esophagus.   PAST MEDICAL HISTORY, FAMILY HISTORY, SOCIAL HISTORY, MEDS, ALLERGIES,  REVIEW OF SYSTEMS:  Per admission H and P.   PHYSICAL EXAM ON DISCHARGE:  VITAL SIGNS:  Temperature 98, pulse 70,  respirations 16, blood pressure 117/63, pulse ox 95% on room air.  HEENT:  Normocephalic, atraumatic.  Pupils were equal, round and  reactive  to light.  Throat without erythema.  CARDIOVASCULAR:  Regular rate and rhythm.  LUNGS: Clear bilaterally.  ABDOMEN:  Positive bowel sounds.  EXTREMITIES:  Without edema.   HOSPITAL COURSE:  1. GI bleed.  Patient was admitted to the hospital, placed on proton-      pump inhibitor.  He had upper endoscopy done that did not show any      active source of bleeding.  He then had a colonoscopy that did not      really show any source of bleeding.  It was thought that the anemia      was secondary to possibly erosive gastritis.  He received 2 units      of blood and hemoglobin remained stable prior to discharge.  2. Seizure disorder.  He was continued on his Depakote while he was in      the hospital and no episodes of seizure noted.  3. Schizophrenia/depression.  He was also continued on his medications      for that.  4. Hypothyroidism.  He was continued on his thyroid supplement.  5. Anemia.  He received 2 units of blood and hemoglobin remained      stable.   DISCHARGE LABS:  Sodium 141, potassium 3.6, chloride 108, CO2 26,  glucose 91, BUN 3, creatinine 1.1.  WBC 7.7, hemoglobin 12, platelets  227.  TSH 2.337.  Depakote level 118.   Chest x-ray.      Ladell Pier, M.D.  Electronically Signed     N[place]/MEDQ  D:  04/27/2007  T:  04/27/2007  Job:  [id number]

## 2011-05-11 NOTE — Op Note (Signed)
[place]. [facility]  Patient:    [name], [name] Visit Number: [id number] MRN: [id number]          Service Type: SUR Location: 3100 [id number] [id number] Attending Physician:  Donn Pierini Dictated by:   Julio Sicks, M.D. Proc. 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  OPERATION PERFORMED:  Re-exploration of craniotomy with evacuation of right frontal intracerebral hematoma and debridement of venous infarction.  SURGEON:  Julio Sicks, M.D.  ANESTHESIA:  General oral endotracheal.  INDICATIONS FOR PROCEDURE:  [name] is a 55 year old black male who was status post an interhemispheric transcallosal resection of a third ventricular tumor today.  Postoperatively the patient has had a rocky course.  He has awakened with a left-sided hemiparesis and has had a number of episodes of generalized seizure activity.  The seizure activity has been controlled and the patient has been observed closely in the intensive care unit.  The patient will awaken and will follow commands on his right side but has a spastic hemiplegia on the left side.  Initial CT scan postoperative demonstrated postoperative changes in the surgical bed without any evidence of hematoma around the resection site; however, there was a right frontal hematoma deep within the frontal lobe consistent with hemorrhage after venous infarction. During the initial surgery it was necessary to take one small central bridging vein anterior to the coronal suture but this is not felt to be significant; however, postoperatively it has become obvious that it was.  The patient had a follow-up CT scan this morning demonstrating worsening edema in  his right frontal lobe with now a large right frontal hemorrhage intermingling with the edematous tissue.  This did not appear to be an arterial hemorrhage but rather does appear to be a venous infarction hemorrhage.  Given the size of the hemorrhage and edema I think it is best to remove the hemorrhage and debride the infarction in order to lessen mass effect in the patients brain.  I have discussed the situation with the patients father and he agrees to proceed.  DESCRIPTION OF PROCEDURE:  The patient was taken to the operating room where he was placed under general anesthesia.  The patients scalp was then prepped and draped sterilely.  The craniotomy wound was reopened and the scalp flap was held in place out laterally.  The bone flap was remove.  Dura was then reopened and folded over the midline.  An obviously tense right frontal lobe was then apparent.  The right frontal lobe was absent any normal cortical markings and was consistent with venous infarction.  A corticectomy was then made and the white matter of the right frontal lobe was entered.  The white matter was very edematous and necrotic.  This was evacuated with suction. There was intermingled hemorrhage encountered through the way.  This was evacuated by gentle suction as well.  After a very thorough evacuation of all devitalized brain and hemorrhage had been performed, moistened cotton balls were placed in the hemorrhage cavity and allowed to remain there for greater than five minutes.  This achieved excellent hemostasis.  There were no active points of bleeding.  All devitalized brain had been  removed.  Surgicel was then used to line the resection cavity.  The wound was irrigated again.  There is no evidence of active hemorrhage.  The dura was loosely reapproximated. Gelfoam was placed around the dural repair.  Bone flap was replaced and the scalp was closed in typical fashion.  A sterile dressing was applied.  There were  no complications.  The patient returned to the intensive care unit in critical condition. Dictated by:   Julio Sicks, M.D. Attending Physician:  Donn Pierini DD:  06/11/02 TD:  06/12/02 Job: [id number]

## 2011-05-11 NOTE — Procedures (Signed)
[facility]  Patient:    [name], [name] Visit Number: [id number] MRN: [id number]          Service Type: EMS Location: [place] Attending Physician:  Lorre Nick Dictated by:   Hedwig Morton. Juanda Chance, M.D. [facility] Admit Date:  04/24/2002   CC:         Venita Lick. Pleas Koch., M.D. [facility]   Procedure Report  PROCEDURE:  Esophageal manometry.  INDICATIONS:  This 55 year old male has complained of intractable vomiting and had abnormal gastric emptying scan.  On upper endoscopy there was grade 2 reflux esophagitis.  He is undergoing manometry to assess his lower esophageal sphincter as well as his motility.  RESULTS:  Manometric catheter was placed through naris into the stomach and was retracted through the lower esophageal sphincter.  The distal portion of the LES was at 41.5 cm proximal and at 38 cm the total length the LES was 3.5 cm.  Average of nine measurements of lower esophageal sphincter yielded average pressure of 18.9 mmHg.  Normal LES pressure is 10-45 mmHg.  The residual pressure was 7.5 mm.  Lower esophageal sphincter did not relax to baseline; it relaxed to baseline only 55% of the time.  Peristaltic waves were measured using 10 wet swallows.  Amplitude _______ in the proximate and distal esophagus was normal.  There were two nontransmitted contractions in the lower esophagus which were present 20% of the contractions.  Upper esophageal sphincter was somewhat slow at 19.2 mm, normal being 30-118 mm, and relaxed to the baseline only 26%.  IMPRESSION:  This is an abnormal upper esophageal manometry study showing poor relaxation of lower esophageal sphincter with an otherwise normal pressure with two nontransmitted contractions and somewhat low upper esophageal sphincter pressure.  PLAN:  This reading is not a contraindication for possible Nissen fundoplication. Dictated by:   Hedwig Morton. Juanda Chance, M.D. [facility] Attending Physician:  Lorre Nick DD:   04/23/02 TD:  04/24/02 Job: [id number]

## 2011-05-11 NOTE — Procedures (Signed)
[facility]  Patient:    [name], [name] Visit Number: [id number] MRN: [id number]          Service Type: MED Location: 3W [id number] Attending Physician:  Miguel Aschoff Dictated by:   Hedwig Morton. Juanda Chance, M.D. [facility] Admit Date:  04/17/2002   CC:         Venita Lick. Pleas Koch., M.D. [facility]   Procedure Report  PROCEDURE:  Upper endoscopy.  INDICATIONS:  This 55 year old African-American male was admitted with intractable and persistent vomiting of several weeks duration with chronic gastroesophageal reflux of five years duration.  He had hematemesis and was found to be iron deficient.  Colonoscopy by Venita Lick. Pleas Koch., M.D., as well as endoscopy, confirmed the presence of a large hiatal hernia.  Gastric emptying scan was initially abnormal, but a small bowel follow through was entirely normal.  She is now undergoing repeat upper endoscopy because of persistent iron deficiency anemia.  ENDOSCOPE:  Olympus single-channel video endoscope.  SEDATION:  Versed 5 mg IV and Demerol 50 mg IV.  FINDINGS:  Esophagus:  The Olympus single-channel video endoscope was passed under direct vision through the posterior pharynx into the esophagus.  The patient was monitored by pulse oximeter.  Oxygen saturations were satisfactory.  The patient was cooperative.  Proximal and distal esophageal mucosa were unremarkable.  In the distal several centimeters of the esophagus, the mucosa was macerated with several linear erosions which measured more than 5 mm in length.  They were converging into the GE junction which was sort of angulated.  The Z-line itself was located at 30 cm from the incisors.  There was no significant stricture, but early fibrous tissue protruding into the lumen suggesting early formation of a stricture.  Stomach:  The stomach was insufflated with air and showed a large sacculated hiatal hernia which extended 5-35 cm from the incisors and had  retained secretions in it.  The hiatal hernia was tortuous with angulated body which made retention of the secretions possible.  The endoscope traversed through this without any difficulty.  The gastric folds themselves were normal with normal-appearing gastric antrum and pyloric outlet.  Duodenum:  The duodenal bulb and descending duodenum were normal.  Small bowel biopsies were taken of the second portion of the duodenum.  The endoscope was brought back into the stomach and retroflexed.  The fundus and cardia were examined.  The hiatal hernia was confirmed by retroflexed view. The patient tolerated the procedure well.  IMPRESSION: 1. A 5 cm, nonreducible, angulated hiatal hernia with retained secretions. 2. Grade 3 reflux esophagitis, status post biopsies.  PLAN:  The patient needs to be treated aggressively for gastroesophageal reflux because of the bleeding from the esophagus, as well as early formation of a stricture.  We will evaluate him further for doing esophageal manometry while continuing him on a high-dose proton pump inhibitor, as well as on antireflux measures. Dictated by:   Hedwig Morton. Juanda Chance, M.D. [facility] Attending Physician:  Miguel Aschoff DD:  04/20/02 TD:  04/20/02 Job: [id number]

## 2011-05-11 NOTE — H&P (Signed)
[place]. [facility]  Patient:    [name], [name] Visit Number: [id number] MRN: [id number]          Service Type: SUR Location: 3100 3113 [id number] Attending Physician:  Donn Pierini Dictated by:   Julio Sicks, M.D. 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  HISTORY OF PRESENT ILLNESS:  The patient is a 55 year old black male who has a history of chronic intermittent nausea and vomiting with severe gastroesophageal reflux failing medical management.  The patient recently had an episode of protracted nausea and vomiting for no apparent reason.  This most recent episode was associated with headaches and some disorientation. The patient was evaluated in the emergency room where a head CT was obtained. This demonstrated a 2 cm third ventricular mass with some mild ventricular dilation.  The patient denies any frequent headaches nor has he had any symptoms of drop attack, but here lately he has had some increasing headaches and there is some thought as to whether his nausea and vomiting may be related to increased intracranial pressure.  PAST MEDICAL HISTORY:  Notable for severe gastroesophageal reflux, as well as a history of schizophrenia.  CURRENT MEDICATIONS: 1. Seroquel. 2. Protonix. 3. Reglan.  ALLERGIES:  None.  FAMILY HISTORY:  Noncontributory.  SOCIAL HISTORY:  The patient is single.  He is disabled.  He lives at home with his father.  He denies smoking.  REVIEW OF SYSTEMS:  Negative for any ongoing cardiac, respiratory, or genitourinary problems.  He has no musculoskeletal complaints.  He only has the GI complaints as enumerated above.  PHYSICAL EXAMINATION:  He is awake and alert.  He is oriented and reasonably appropriate.  His speech is somewhat slow, but content is appropriate.  He is fluent.  Judgment and insight appear somewhat  off.  Cranial nerve function finds visual acuity and visual fields to be normal bilaterally.  Extraocular movements are full.  Facial movement and sensation are normal bilaterally. Hearing is normal bilaterally.  The tongue protrudes in the midline.  The palate elevates to the midline.  Motor and sensory examination of the extremities is normal.  The deep tendon reflexes are normoactive.  There are no long track signs.  Gait and posture are normal.  I reviewed the patients MRI scan and CT scan of his brain.  This demonstrates a 2 cm semicystic mass consistent with a colloid cyst along the roof of the third ventricle.  This does abut and compress both foramen of Monroe and there is some mild ventricular dilation present.  There is no evidence of any other neoplasms or mass.  This patient has a rather large third ventricular mass consistent with a colloid cyst.  This does exert some pressure on his foramen of Monroe and there may be some degree of intermittent hydrocephalus.  In addition to this, the patients chronic nausea and vomiting may be in part related to this.  I had a long discussion with both the patient and his father.  I have discussed options as well for management.  Unfortunately, observational management of colloid cyst is prickly difficult and there is some slight risk of sudden death with continued observation alone.  The patient and his father understand this.  I have discussed both observation and the possibility of undergoing  surgical resection.  We have discussed the possibility of undergoing a bifrontal parietal craniotomy with transcallosal resection of this mass.  I have discussed in the detail the possible risks of this surgery, including the risks of anesthesia, bleeding, infection, stroke, coma, and possibly even death.  We also discussed the possibility of long-term memory disorders and discussed the possibility of inability to fully resect the mass.  The  patient and his father are aware.  They wish to proceed. Dictated by:   Julio Sicks, M.D. Attending Physician:  Donn Pierini DD:  06/10/02 TD:  06/11/02 Job: [id number]

## 2011-05-11 NOTE — Discharge Summary (Signed)
[facility]  Patient:    DAYN, BARICH Visit Number: [id number] MRN: [id number]          Service Type: EMS Location: [place] Attending Physician:  Lorre Nick Dictated by:   Mike Gip, P.A.C. Admit Date:  04/24/2002 Disc. Date: 04/22/02   CC:         Darcey Nora. Audria Nine, M.D.  Adolph Pollack, M.D.   Discharge Summary  ADMITTING DIAGNOSES: 63. A 55 year old, African-American male with coffee-grounds emesis, rule out    Mallory-Weiss tear and erosive esophagitis. 2. History of depression. 3. Microcytic anemia with recent negative colonoscopy, suspect secondary to    ongoing upper gastrointestinal loss. 4. Chronic gastroesophageal reflux disease. 5. Barretts esophagus with documented large hiatal hernia. 6. Recurrent nausea and vomiting, rule out gastroparesis, small bowel    pathology or functional etiology. 7. Disability.  DISCHARGE DIAGNOSES: 1. Large hiatal hernia with retained contents in hiatal hernia with chronic    nausea and vomiting secondary to above. 2. Severe erosive esophagitis secondary to huge hiatal hernia, underlying    Cameron erosions and repeated nausea and vomiting. 3. Iron deficiency anemia secondary to above. 4. Abnormal gastric emptying scan, however, suspect motility problem is    primarily with hiatal hernia.  CONSULTATION:  GI, Dr. Juanda Chance and Dr. Russella Dar.  PRIMARY PHYSICIAN:  Dr. Audria Nine, admitted on [facility] Internal Medicine Service.  PROCEDURES: 1. Gastric emptying scan x2. 2. Upper endoscopy. 3. Small bowel follow through. 4. Esophageal manometry.  HISTORY OF PRESENT ILLNESS:  Mr. 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 The patient presented to the emergency room complaining of three- to four-week history of intermittent coffee-grounds emesis.  However, over the two days prior to admission, he had  been having repeated nausea, vomiting and coffee-grounds emesis.  He had also noted onset of black stools within 36 hours of admission.  He denied any abdominal pain with no shortness of breath, dizziness, chest pain, etc.  He has an indifferent affect.  He has known documented history of large hiatal hernia, chronic GERD, Barretts esophagus and had just undergone upper endoscopy earlier in April.  He states that he has been compliant with his Protonix and denies any aspirin, NSAIDs or ETOH. Due to the coffee-grounds emesis, he was admitted through the emergency room on the [facility] service and then [facility] was consulted.  LABORATORY DATA AND X-RAY FINDINGS:  On admission, WBC 5.4, hemoglobin 10.7, hematocrit 32.8, MCV 71.8, platelets 416.  Follow up on April 26, with hemoglobin 10.1, hematocrit 31.3, MCV 71.9.  Followup on April 27, with hemoglobin 9.8, hematocrit of 28.9.  Followup on April 28, with hemoglobin 9.2 and hematocrit of 28.3.  On April 29, hemoglobin 8.9 and hematocrit 24.7.  On April 30, hemoglobin 9.4 and hematocrit of 28.3.  He was not transfused. Hemoglobin A1C was 5.3.  Electrolytes on admission within normal limits. Glucose was 147.  He also had another documented glucose of 150.  BUN on admission was 7 with creatinine 1.2 and albumin 3.7.  Liver function studies normal.  Amylase was elevated at 182.  Serum iron low at 24, TIBC 357, iron saturation 7 and ferritin of 3.  Urinalysis was negative.  Gastric emptying scan done on April 25, showed markedly poor gastric emptying with significant retention of 99-100%.  This was repeated on April 29.  The patient had been on Reglan for a few days in the interim and was shown to have 43%  retention at two hours.  Small bowel follow through on April 26, showed a moderate hiatal hernia with evidence of significant reflux into the upper thoracic esophagus.  Small bowel was unremarkable.  HOSPITAL COURSE:  The patient was admitted  to the [facility] Internal Medicine Service and [facility] was consulted.  He was initially placed on IV fluids and given antiemetics.  Serial H&Hs were obtained, however, he did not require transfusion this admission.  He was started on IV Protonix b.i.d.  He was scheduled for gastric emptying scan which was completed on April 25, which showed significant delay in gastric emptying.  He was then started on IV Reglan.  He was also scheduled for small bowel follow through to rule out anymore distal small bowel dysmotility or obstruction.  This was negative, although did reproduce significant reflux of the upper esophagus.  The patient continued to have nausea and vomiting while in the hospital and did have some documented coffee-grounds emesis, although no significant drop in his hemoglobin.  The decision was made to repeat his endoscopy on April 20, 2002. He had had an endoscopy earlier in the month which did not show any evidence of Cameron erosions, erosive esophagitis or peptic ulcer disease.  Endoscopy, per Dr. Juanda Chance on April 28, did show a large hiatal hernia at 5-7 cm, nonreducible with a lot of retained secretions up in the hernia.  There was no retained gastric secretions.  He had grade 3 esophagitis with erosions and bleeding and biopsies were taken.  It was felt that he was probably having chronic loss due to erosive esophagitis and underlying chronic Cameron erosions.  We proceeded with esophageal manometry on April 29, in anticipation of possibly referral for Nissen fundoplication.  Results of this are pending at the time of this dictation.  We also repeated gastric emptying scan clinically and endoscopically he was not felt to have significant gastric retention and there was more concern for dysmotility due to the retained secretions in the hiatal hernia.  Repeat gastric emptying scan on Reglan showed 43% retention.   By April 30, the patient was stable and had had no further  drift in his hemoglobin.  He was still vomiting intermittently, but no further coffee-ground material.  He was allowed discharge to home with instructions to maintain a low residue, low-fat diet with small frequent feedings.  Antireflux regimen remaining upright for two hours post meals and NPO for at least three hours prior to bedtime.  DISCHARGE MEDICATIONS: 1. Protonix 40 mg b.i.d. 2. Feosol liquid 10 cc t.i.d. 3. Reglan 10 mg 1/2 hour a.c. 4. Seroquel 30 mg q.h.s. as previous. 5. Phenergan 25 mg q.6h. p.r.n. nausea and vomiting.  FOLLOWUP:  Follow-up appointment with Dr. Russella Dar on May 14, at 2 p.m. and also arranged surgical consultation with Dr. Abbey Chatters on May 15, at 9 a.m.  CONDITION ON DISCHARGE:  Stable. Dictated by:   Mike Gip, P.A.C.  Attending Physician:  Lorre Nick DD:  04/22/02 TD:  04/24/02 Job: [id number]

## 2011-05-11 NOTE — Consult Note (Signed)
NAME:  [name], [name] NO.:  [id number]   MEDICAL RECORD NO.:  [id number]          PATIENT TYPE:  INP   LOCATION:  1227                         [facility]ACILITY[facility]  [facility]   PHYSICIAN:  Arturo Morton. Riley Kill, MD, FACCDATE OF BIRTH:  1956-03-06   DATE OF CONSULTATION:  03/20/2007  DATE OF DISCHARGE:                                 CONSULTATION   REQUESTING PHYSICIAN:  [facility].   CHIEF COMPLAINT:  Vomiting.   HISTORY OF PRESENT ILLNESS:  [name] is a 55 year old gentleman who  we have been consulted on because of an elevated troponin.  Patient was  admitted with intractable vomiting.  This has been going on for several  weeks.  The patient has a chronic problem and has had a mild ileus.  He  was found to have an acute pneumonia.  This is bilateral.  There is a  suggestion of possible obstruction at the level of the esophagus.  He  was admitted to the floor, developed some hypoxemia as well as a drop in  blood pressure and mild tachycardia.  Enzymes were obtained.  The  troponin was elevated to 3.27, and CK-MB was 2.6.  Importantly, the  patient denies any chest pain.  He has had a cough.  He has also had a  fever.  He had gotten progressively worse over the past 24 hours,  according to his sister, and he was subsequently brought to the  emergency room.  Since transfer to the intensive care unit, he has to  some extent stabilized.   PAST MEDICAL HISTORY:  Somewhat difficult to obtain.  He was sent for a  CT angiogram.  This did not demonstrate proximal pulmonary emboli,  although the distal emboli could not be excluded due to lower lobe  atelectasis.  There was marked esophageal dilatation with possible  distal esophageal obstruction.  He has been thought to have an  aspiration pneumonia, and he has been placed on antibiotics.  Again,  specifically, he denies chest pain.   The ability to provide his past medical history is somewhat limited.  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  He is schizophrenic and __________  Barrett's esophagus.  There is depression, hypothyroidism, and gastroesophageal reflux, which  has been severe.   His medications include Reglan, Protonix 40 mg b.i.d., MiraLax, aspirin  81 mg daily, Depakote, Lamictal, levothyroxine 75 mcg daily, and  Seroquel 150 mg nightly.  He has been started on metoprolol 12.5 b.i.d.  and aspirin 325 mg daily.  Lovenox was ordered.   At the current time, family history is not obtained.   The social history is remarkable for the fact that he has had this  tumor.  His sister and father have provided care for him, but he is in a  nursing home.   A CT scan of the abdomen revealed bilateral lower lobe atelectasis, non-  atelectatic pneumonia.  No large pulmonary arteries and marked  esophageal dilatation with rule out distal esophageal obstruction.  The  abdominal CT revealed adynamic ileus.   The EKG is done.  This reveals an ileus.   PHYSICAL EXAMINATION:  GENERAL:  On his examination currently, the  patient is able to answer questions.  VITAL SIGNS:  His temperature is 100, his heart rate is 95, his blood  pressure is 105/75, respirations are 22 and slightly labored.  CARDIAC:  The cardiac rhythm is regular, and I do not appreciate a  significant murmur.  LUNGS:  The lung fields reveal diffuse rhonchi throughout.  ABDOMEN:  Soft but I do not hear good bowel sounds.  Distal pulses are  intact.   The electrocardiogram demonstrates normal sinus rhythm with nonspecific  T wave abnormality.   The pO2 is 59.  The hemoglobin is 13.4, white count 8400, platelet count  155.  D-dimer is 1.16.  Potassium 3.9, BUN 21, creatinine 1.7.  The CK-  MB is 2.6, and the troponin is 3.27.   Chest x-ray and CT, and abdominal CT are as noted.   IMPRESSION:  1. Prior craniotomy.  2. Severe aspiration pneumonia with possible sepsis.  3. Abnormal troponin in the  setting of multisystem illness.   RECOMMENDATIONS:  1. Low dose subcutaneous low molecular weight heparin to reduce the      risk of pulmonary embolism.  I agree with aspirin and perhaps low      dose metoprolol if the patient tolerates this.  2. I would add low dose low molecular weight heparin, but I would be      careful about high dose in the setting of prior craniotomy and also      from the standpoint of current pneumonic process.  3. I would get serial enzymes.  We will order a two dimensional      echocardiogram.  4. The patient may need both GI and pulmonary consults.   We will be happy to follow the patient with you.  At the present time,  he would not be a good candidate for reperfusion therapy in the absence  of significant ST segment elevation.      Arturo Morton. Riley Kill, MD, [facility]  Electronically Signed     TDS/MEDQ  D:  03/20/2007  T:  03/20/2007  Job:  [id number]

## 2011-05-11 NOTE — H&P (Signed)
NAME:  [name], [name] NO.:  [id number]   MEDICAL RECORD NO.:  [id number]          PATIENT TYPE:  EMS   LOCATION:  MAJO                         FACILITY:  [facility]   PHYSICIAN:  Michaelyn Barter, M.D. DATE OF BIRTH:  1956/02/05   DATE OF ADMISSION:  02/16/2006  DATE OF DISCHARGE:                                HISTORY & PHYSICAL   PRIMARY CARE Clodagh Odenthal:  Immunologist Care.   CHIEF COMPLAINT:  Vomitting.   HISTORY OF PRESENT ILLNESS:  The patient is a 55 year old male resident of  [place], who was recently discharged from [facility] after  being here from January 14, 2006 up until January 29, 2006.  When the  patient initially presented to the hospital at that time, he arrived with a  chief complaint of vomiting.  Following his presentation into the hospital,  he had an extensive workup and was treated for a persistent ileus along with  a GI bleed, aspiration pneumonia, hypokalemia.  During that admission, he  also underwent evaluation by Gastroenterology, which included the  performance of an EGD by Dr. Lina Sar on January 22, 2006, which revealed  severe gastroesophageal reflux and esophagitis/Barrett's esophagus.  A large  hiatal hernia was also found during that time-frame.  The patient received 2  units of packed RBCs secondary to chronic GI blood loss at that particular  time.  Likewise, the patient received 9 days total of Zosyn and vancomycin  for treatment of aspiration pneumonia.  By January 29, 2006, the patient's  nausea and vomiting had resolved; likewise did his ileus and subsequently he  was discharged to [facility].  Today he returns and he states that he  threw up 3 times this evening.  He said his vomit looked like coffee  grounds.  It is not clear whether or not this is actually the appearance of  the emesis as he perceived it versus someone telling him that it looked like  coffee grounds.  He denied having any current  abdominal pain.  No fever or  chills.  He said he felt fine prior to the episode of vomiting.  He does  complain of some loose stools today and states that they were black in  color.   PAST MEDICAL HISTORY:  1.  GERD.  2.  A large hiatal hernia and Barrett's esophagus.  3.  Schizophrenia.  4.  Depression.  5.  Seizure disorder.  6.  Hypothyroidism.  7.  Ileus.  8.  Gastrointestinal bleed.  9.  Recently treated aspiration pneumonia.  10. Transient hypocalcemia.  11. Hypertension.  12. History of depression.  13. The patient had a history of coffee-grounds emesis that goes back as far      as April 2003.   PAST SURGICAL HISTORY:  1.  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 Julio Sicks.  2.  Bilateral frontoparietal craniotomy with transcallosal resection of the      third ventricular tumor, completed June 10, 2002 by Dr.  Julio Sicks.  3.  Reexploration of craniotomy with evacuation of right frontal      intracerebral hematoma and debridement of venous infarction, completed      June 10, 2002 by Dr. Julio Sicks.   ALLERGIES:  No known drug allergies.   CURRENT MEDICATIONS:  1.  Aspirin 81 mg p.o. daily.  2.  Depakote 750 mg p.o. twice daily.  3.  Fluoroquinolone one capsule daily.  4.  Lamictal 75 mg p.o. daily.  5.  Levothyroxine 75 mcg p.o. daily.  6.  Reglan 5 mg p.o. a.c. and h.s.  7.  Prilosec 20 mg daily.  8.  Seroquel 300 mg p.o. nightly.  9.  Senokot tablets one p.o. nightly.  10. Colace 100 mg p.o. twice daily.  11. Phenergan 25 mg p.o. q.4 h. p.r.n. for nausea.  12. Tylenol 650 mg p.o. q.4-6 h. p.r.n. for pain and fever.   FAMILY HISTORY:  Mother has breast cancer, father -- coronary artery  disease.   SOCIAL HISTORY:  Cigarettes:  Denies.  Alcohol:  Denies.   REVIEW OF SYSTEMS:  As per HPI.   PHYSICAL EXAMINATION:  GENERAL:  The patient is  awake.  he does not appear  to be in any obvious distress.  He is cooperative.  VITALS:  Temperature 98.6, blood pressure 134/93, O2 95% on room air,  respirations 16, O2 SAT 99% on room air.  HEENT:  Normocephalic, atraumatic.  Extraocular movements are intact.  Pupils are anicteric and they constrict equally to light.  The patient is  edentulous with dentures present, upper and lower.  NECK:  Supple with no lymphadenopathy.  Thyroid is not palpable.  Carotid  upstrokes are strong bilaterally.  CARDIAC:  S1 and S2 are present, regular rate and rhythm.  RESPIRATORY:  Lungs are clear, no crackles or wheezes.  ABDOMEN:  Soft, nontender and non-distended.  Positive bowel sounds.  EXTREMITIES:  No leg edema.  NEUROLOGIC:  The patient is alert and oriented x3.  MUSCULOSKELETAL:  Upper and lower extremity strength 5/5.   LABORATORY DATA:  White blood cell count 8.1, hemoglobin 14.9, hematocrit  44.1, platelets 271,000.  PT 12.7, INR 0.9, PTT 24.  Sodium 139, potassium  4.0, chloride 102, CO2 30, BUN 12, creatinine 1.2, glucose 115, bilirubin  total 0.5, alkaline phosphatase 69, SGOT 22, SGPT less than 8, total protein  7.2, albumin 3.7 and calcium 8.9.   ASSESSMENT AND PLAN:  1.  Hematemesis:  Mr. Gin has a history of hematemesis, therefore this      may be an acute-on-chronic condition; likewise, he recently had an      esophagogastroduodenoscopy completed less than a month ago.  We will      follow his hemoglobins and hematocrits closely for now.  We will start      Protonix.  We will consult Gastroenterology.  The patient's hemoglobin      appears to be  stable right now and actually appears to be up since his      last hemoglobin, completed on January 27, 2006, at which time his      hemoglobin was 11.8; as of today, it is 14.9.  There may be some      hemoconcentration accounting for the 14.9, but again, it appears to be     stable.  Gastroenterology should be consulted to give  their input.  I      question whether or not the patient will need an  esophagogastroduodenoscopy, given the fact that his hemoglobin appears      to be so stable at this particularly time, but again, that will be left      up to Gastroenterology.  We will check hemoglobin and hematocrit every 8      hours for now and we will provide Protonix.  2.  Questionable diarrhea:  We will check stool studies.  The patient is      also on multiple stool softeners; we will hold these for now, as they      may be contributing to the patient's complaint of diarrhea.  3.  History of seizure disorder:  There are no reports of seizures at this      particular time, therefore we will monitor this.  4.  History of hyperlipidemia:  We will check a fasting lipid profile and      monitor for now.  5.  History of hypothyroidism:  We will resume the patient's levothyroxine      75 mcg p.o. daily.  6.  History of schizophrenia/bipolar disorder.  We will resume the patient's      Seroquel and Depakote for now.  7.  History of gastroesophageal reflux disease with a large hiatal hernia      and Barrett's esophagus.  We will provide Protonix for now.      Michaelyn Barter, M.D.  Electronically Signed     OR/MEDQ  D:  02/16/2006  T:  02/16/2006  Job:  [id number]

## 2011-05-11 NOTE — H&P (Signed)
[facility]  Patient:    [name], [name] Visit Number: [id number] MRN: [id number]          Service Type: MED Location: 3W [phone] Attending Physician:  Miguel Aschoff Dictated by:   Jetty Duhamel, M.D. Admit Date:  04/17/2002                           History and Physical  DATE OF BIRTH:  [birth year]  PRIMARY CARE PHYSICIAN:  Dr. Audria Nine at [facility].  GI PHYSICIAN:  Dr. Claudette Head, Montez Hageman., M.D., with [facility]  CHIEF COMPLAINT:  Coffee ground emesis.  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  Unfortunately, he is not able to provide a thorough history because of this perceived ailment. Nonetheless he is able to report a three to four history of intermittent coffee ground emesis occurring every two to three weeks per his best report. Of note, over the past two to three days, the patient has again began to experience coffee ground emesis on the level of two to three times per day. With this he has begun to notice in the last 36 hours black tarry melanotic stools.  He denies dizziness, shortness of breath, chest pain, nausea, or high volume vomitus.  He denies any abdominal whatsoever.  He does have a significant history of hiatal hernia, gastroesophageal reflux disease, and Barretts esophagus originally noted via EGD earlier this month by Dr. Claudette Head.  He has been compliant with his Protonix as previously prescribed and denies heavy use of nonsteroidal anti-inflammatory drugs.  PAST MEDICAL HISTORY: 1. Gastroesophageal reflux disease with Barretts esophagus.    A. Esophagogastroduodenoscopy April 01, 2002, confirming Barretts.    B. Biopsy April 01, 2002, without evidence of dysplasia or malignancy.    C. Three to four year history of episodic hematemesis. 2. Depression. 3. Hiatal hernia. 4. Normal colonoscopy  April 01, 2002. 5. Episodic mild rectal bleeding noted since November 2002. 6. Questionable history of hypertriglyceridemia. 7. 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  OUTPATIENT MEDICATIONS: 1. Seroquel 300 mg q.h.s. 2. Phenergan p.r.n. 3. Protonix 40 mg q.d.  ALLERGIES:  No known drug allergies.  FAMILY HISTORY:  Positive for mother with breast cancer and father with history of coronary artery bypass grafting though age of first onset of coronary artery disease is unknown.  SOCIAL HISTORY:  The patient is single.  He does not drink or smoke tobacco. He is disabled, he is unemployed.  He lives with his siblings, apparently a brother and sister.  ADMISSION LABORATORY DATA:  Hemoglobin 10.7 and MCV 72 with hemoglobin 10.2 and MCV 72 on April 09, 2002.  White count 5.4, platelets 416, absolute neutrophil count 3.0.  Urinalysis is unremarkable.  Basic metabolic panel is unremarkable.  Hepatic function panel unremarkable, elevated amylase at 182.  PHYSICAL EXAMINATION:  VITAL SIGNS:  Temperature 97.3, blood pressure 132/80, heart rate 79, respiratory rate 16, O2 saturation 98% on room air.  GENERAL:  Pleasant oriented male in no acute respiratory distress.  HEENT:  Head is normocephalic, atraumatic.  Pupils equal, round, and reactive to light and accommodation.  OC/OP clear.  NECK:  No lymphadenopathy or thyromegaly.  CARDIOVASCULAR:  Regular rate and rhythm without murmur, gallop or rub. Normal S1 and S2.  LUNGS:  Clear to auscultation bilaterally  without wheezing or rhonchi.  ABDOMEN:  Nontender, nondistended, soft.  Bowel sounds present.  No hepatosplenomegaly, no rebound, no ascites.  EXTREMITIES:  No cyanosis, clubbing or edema.  NEUROLOGIC:  Nonfocal neurologic exam.  REVIEW OF SYSTEMS:  Not reliable because questionable understanding on the patients behalf on full questioning.   Nonetheless, on very specific detailed questioning, he denies any complaints whatsoever with exception to his coffee ground emesis and episodic melena.  IMPRESSION/PLAN: 1. Coffee ground emesis - The patients gastroenterologist has been consulted    and has evaluated the patient.  They have agreed to partake in a workup on    inpatient setting to assure that this problem is evaluated thoroughly.  The    CBCs will be monitored closely.  At this time, however, the patient is    hemodynamically stable and this does not appear to represent a life    threatening gastrointestinal bleed.  This is, however, a chronic problem    and I agree with inpatient workup at this time. 2. Melena - The patient does report a history of melena.  This is likely    secondary to upper gastrointestinal bleeding likely related to Barretts    esophagus and severe esophagitis.  Again, the patient appears to be    hemodynamically stable at this time.  Gastroenterology is consulting and    will also attend to this issue. 3. History of depression - The patient is stable from this standpoint.  We    will continue his noted outpatient medication which is Seroquel at the dose    that he has apparently been on for some time now.  There are no acute    complications concerning this issue. 4. Microcytic anemia - This is felt to be clearly secondary to probable    esophagitis or other source of mild upper gastrointestinal bleeding.    Again, the patient is hemodynamically stable.  Of note, he did have a    normal colonoscopy done actually just weeks ago.  Again, gastroenterology    is following the case and is pursuing workup of this and the other above    mentioned issues as is appropriate.  It would appear that the hospital service was called by the emergency room physician to evaluate Mr. [name] and to perform his admission without their knowledge that the patient was followed closely by a gastroenterologist through  [facility].  Indeed, the patient has undergone EGD as well as  colonoscopy by Dr. Russella Dar within weeks of this admission.  There are no other medical issues that are unstable at this time.  I have contacted the physician assistant for [facility] and explained to her that I do not feel there were any active medical problems and that I felt it was most appropriate for this patient to be on the service of the gastroenterology team which knows his history well.  She has agreed to this and I will further discuss this with the attending physician after she has made her evaluation.  In such, I anticipate that the patient will be transferred to the GI service shortly after the time that this dictation has been completed. Dictated by:   Jetty Duhamel, M.D. Attending Physician:  Miguel Aschoff DD:  04/17/02 TD:  04/18/02 Job: [id number]

## 2011-05-11 NOTE — Discharge Summary (Signed)
NAME:  [name], [name]          ACCOUNT NO.:  [id number]   MEDICAL RECORD NO.:  [id number]          PATIENT TYPE:  INP   LOCATION:  6715                         FACILITY:  [facility]   PHYSICIAN:  Lonia Blood, M.D.       DATE OF BIRTH:  1956-04-14   DATE OF ADMISSION:  02/16/2006  DATE OF DISCHARGE:  02/20/2006                                 DISCHARGE SUMMARY   DISCHARGE DIAGNOSES:  1.  Mild upper gastrointestinal bleed, most likely Mallory-Weiss tear.  2.  Constipation, improved.  3.  Large hiatal hernia.  4.  Barrett's esophagus.  5.  Schizophrenia.  6.  Depression.  7.  Seizure disorder.  8.  Hypothyroidism.  9.  Severe gastroesophageal reflux disease.  10. Hypertension.  11. Depression.  12. Craniotomy, status post resection of a third ventricular neoplasm and      evacuation of an intracranial hematoma.  13. Bilateral frontal parietal craniotomy.   DISCHARGE MEDICATIONS:  1.  Zelnorm 6 mg by mouth twice daily.  2.  Reglan 5 mg by mouth before each meal 3 time a day and also at bedtime.  3.  Protonix 40 mg by mouth twice daily.  4.  MiraLax 17 gm once daily.  5.  Dulcolax suppository once daily.  6.  Aspirin 81 mg daily.  7.  Depakote 750 mg by mouth twice daily.  8.  Lamictal 75 mg daily.  9.  Levothyroxine 75 mcg daily.  10. Seroquel 150 mg by mouth at bedtime.  11. Phenergan 25 mg by mouth every 6 hours as needed for nausea.  12. Tylenol 650 mg every 4-6 hours as needed for pain.  13. Multivitamins 1 tablet by mouth daily.   CONDITION ON DISCHARGE:  [name] was discharged in good condition.  He  is to be transferred to [facility], where he will resume the usual  care he was getting before admission.  At the time of discharge, Mr.  [name] was able to ambulate without any difficulty.  He was able to take  care of his activities of daily living without any difficulty.   CONSULTATIONS:  During this admission, we have consulted Dr. Russella Dar from  [facility]astroenter[facility].   PROCEDURE:  During this admission, no procedures were done.   For admission history and physical, please refer to the dictated H&P done by  Dr. Michaelyn Barter on February 16, 2006.  Briefly, [name] is a 55-  year-old man recently discharged from [facility], was brought in  from [facility] for vomit that looked like coffee-ground emesis.  In  the emergency room, the patient was found to be in no acute distress, but he  was admitted to assure that he will not develop a life-threatening GI bleed.   HOSPITAL COURSE:  Problem 1:  Mild gastroenteritis bleed:  [name]'s  hemoglobin remained stable throughout his hospitalization.  In fact, his  hemoglobin was 12.9 the day prior to discharge.  He did not require any  blood transfusions during this hospital stay.  The patient was seen in  consultation by Dr. Russella Dar from [facility], and he  was deemed  safe to be discharged home without any further procedures.  The patient's  gastrointestinal medications were adjusted.  Please see the list of the  medications the patient should take.  Dr. Russella Dar stressed the importance of  not substituting the Protonix with Prilosec for this particular patient.  Problem 2:  Questionable diarrhea:  [name] did not have any absorbed  diarrhea here in the hospital.  Problem 3:  History of seizure disorders:  [name] did not have any  seizure episodes here in the hospital.  Problem 4:  Hypokalemia secondary to the nausea and vomiting:  The patient's  potassium was adequately repleted.  Problem 5:  History of hypothyroidism:  The patient was kept on his home  dose of levothyroxine.  Problem 6:  Schizophrenia:  [name] was calm and controlled throughout  his hospital stay.      Lonia Blood, M.D.  Electronically Signed     SL/MEDQ  D:  02/19/2006  T:  02/19/2006  Job:  [id number]   cc:   Venita Lick. Russella Dar, M.D. [facility]  [address]   [place]  [place] [address]   [facility]

## 2011-09-18 LAB — CBC
HCT: 33.8 — ABNORMAL LOW
Hemoglobin: 5.8 — CL
MCHC: 30.3
MCHC: 30.4
MCV: 66.8 — ABNORMAL LOW
MCV: 66.9 — ABNORMAL LOW
Platelets: 266
RBC: 3.37 — ABNORMAL LOW
RBC: 5.05
RDW: 29.3 — ABNORMAL HIGH
WBC: 5.8
WBC: 8.9

## 2011-09-18 LAB — RETICULOCYTES
RBC.: 3.51 — ABNORMAL LOW
Retic Count, Absolute: 56.2
Retic Ct Pct: 1.6

## 2011-09-18 LAB — TYPE AND SCREEN: ABO/RH(D): O POS

## 2011-09-18 LAB — DIFFERENTIAL
Basophils Relative: 2 — ABNORMAL HIGH
Eosinophils Relative: 6 — ABNORMAL HIGH
Monocytes Relative: 10
Neutrophils Relative %: 35 — ABNORMAL LOW

## 2011-09-18 LAB — COMPREHENSIVE METABOLIC PANEL
AST: 26
Albumin: 3.7
Calcium: 8.9
Chloride: 103
Creatinine, Ser: 1.48
GFR calc Af Amer: >60
Total Protein: 7.6

## 2011-09-18 LAB — POCT I-STAT, CHEM 8
Calcium, Ion: 1.07 — ABNORMAL LOW
HCT: 24 — ABNORMAL LOW
Hemoglobin: 8.2 — ABNORMAL LOW
Sodium: 140
TCO2: 27

## 2011-09-18 LAB — VALPROIC ACID LEVEL: Valproic Acid Lvl: 54.7

## 2011-09-18 LAB — OCCULT BLOOD X 1 CARD TO LAB, STOOL: Fecal Occult Bld: NEGATIVE

## 2011-09-18 LAB — APTT: aPTT: 27

## 2011-09-18 LAB — PROTIME-INR
INR: 1
Prothrombin Time: 13.2

## 2011-09-18 LAB — VITAMIN B12: Vitamin B-12: 764 (ref 211–911)

## 2011-09-18 LAB — HAPTOGLOBIN: Haptoglobin: 223 — ABNORMAL HIGH

## 2011-09-18 LAB — IRON AND TIBC: UIBC: 433

## 2011-09-19 LAB — POCT CARDIAC MARKERS
CKMB, poc: 4.1
Myoglobin, poc: 120
Operator id: 290111

## 2011-09-19 LAB — URINALYSIS, ROUTINE W REFLEX MICROSCOPIC
Bilirubin Urine: NEGATIVE
Hgb urine dipstick: NEGATIVE
Nitrite: NEGATIVE
Protein, ur: 30 — AB
Specific Gravity, Urine: 1.024
Urobilinogen, UA: 0.2

## 2011-09-19 LAB — CBC
HCT: 32.7 — ABNORMAL LOW
Hemoglobin: 10.3 — ABNORMAL LOW
MCHC: 31.4
MCV: 73.2 — ABNORMAL LOW
Platelets: 321
RBC: 4 — ABNORMAL LOW
RBC: 4.47
RDW: 26.3 — ABNORMAL HIGH
WBC: 10.3

## 2011-09-19 LAB — CROSSMATCH

## 2011-09-19 LAB — HEMOGLOBIN AND HEMATOCRIT, BLOOD
HCT: 30.8 — ABNORMAL LOW
HCT: 34 — ABNORMAL LOW
Hemoglobin: 11 — ABNORMAL LOW
Hemoglobin: 9.9 — ABNORMAL LOW

## 2011-09-19 LAB — POCT I-STAT, CHEM 8
Creatinine, Ser: 1.2
Glucose, Bld: 117 — ABNORMAL HIGH
HCT: 37 — ABNORMAL LOW
Hemoglobin: 12.6 — ABNORMAL LOW
Potassium: 4.2
Sodium: 142
TCO2: 28

## 2011-09-19 LAB — URINE MICROSCOPIC-ADD ON

## 2011-09-24 LAB — DIFFERENTIAL
Basophils Relative: 0
Lymphocytes Relative: 5 — ABNORMAL LOW
Lymphs Abs: 0.7
Monocytes Relative: 5
Neutro Abs: 13.3 — ABNORMAL HIGH

## 2011-09-24 LAB — URINALYSIS, ROUTINE W REFLEX MICROSCOPIC
Glucose, UA: NEGATIVE
Ketones, ur: 40 — AB
Leukocytes, UA: NEGATIVE
Nitrite: NEGATIVE
Specific Gravity, Urine: 1.03
pH: 8

## 2011-09-24 LAB — URINE MICROSCOPIC-ADD ON

## 2011-09-24 LAB — COMPREHENSIVE METABOLIC PANEL
ALT: 16
AST: 43 — ABNORMAL HIGH
Albumin: 4.3
Chloride: 99
Creatinine, Ser: 1.22
GFR calc Af Amer: >60
Sodium: 143
Total Bilirubin: 0.9

## 2011-09-24 LAB — CBC
MCV: 71.3 — ABNORMAL LOW
Platelets: 344
RBC: 5.44
WBC: 14.7 — ABNORMAL HIGH

## 2011-10-03 LAB — CBC
HCT: 34.2 — ABNORMAL LOW
HCT: 37.5 — ABNORMAL LOW
HCT: 38.7 — ABNORMAL LOW
Hemoglobin: 11.2 — ABNORMAL LOW
Hemoglobin: 12.3 — ABNORMAL LOW
Hemoglobin: 12.4 — ABNORMAL LOW
Hemoglobin: 12.7 — ABNORMAL LOW
MCHC: 32.8
MCHC: 32.9
MCHC: 33.1
MCV: 85.5
MCV: 86.1
MCV: 86.7
Platelets: 272
Platelets: 340
Platelets: 361
RBC: 3.94 — ABNORMAL LOW
RBC: 4.32
RBC: 4.38
RBC: 4.49
RDW: 18.2 — ABNORMAL HIGH
RDW: 18.5 — ABNORMAL HIGH
RDW: 18.6 — ABNORMAL HIGH
WBC: 10.6 — ABNORMAL HIGH
WBC: 7.2
WBC: 7.5
WBC: 8.5

## 2011-10-03 LAB — HEPATIC FUNCTION PANEL
ALT: 15
AST: 27
Albumin: 2.5 — ABNORMAL LOW
Alkaline Phosphatase: 52
Bilirubin, Direct: 0.1
Indirect Bilirubin: 0.6
Total Bilirubin: 0.7
Total Protein: 6.1

## 2011-10-03 LAB — BASIC METABOLIC PANEL
BUN: 10
BUN: 12
CO2: 27
CO2: 31
Calcium: 8.6
Chloride: 105
Chloride: 106
Creatinine, Ser: 1.13
Creatinine, Ser: 1.28
GFR calc Af Amer: >60
Glucose, Bld: 105 — ABNORMAL HIGH
Glucose, Bld: 87
Glucose, Bld: 94
Potassium: 3.5
Potassium: 4.2
Sodium: 139

## 2011-10-03 LAB — BASIC METABOLIC PANEL WITH GFR
BUN: 2 — ABNORMAL LOW
BUN: 8
BUN: 8
CO2: 29
CO2: 30
CO2: 32
Calcium: 7.4 — ABNORMAL LOW
Calcium: 7.9 — ABNORMAL LOW
Calcium: 8.1 — ABNORMAL LOW
Chloride: 103
Chloride: 105
Chloride: 106
Creatinine, Ser: 1.07
Creatinine, Ser: 1.16
Creatinine, Ser: 1.44
GFR calc Af Amer: >60
GFR calc Af Amer: >60
GFR calc Af Amer: >60
GFR calc non Af Amer: 52 — ABNORMAL LOW
GFR calc non Af Amer: >60
GFR calc non Af Amer: >60
Glucose, Bld: 100 — ABNORMAL HIGH
Glucose, Bld: 145 — ABNORMAL HIGH
Glucose, Bld: 163 — ABNORMAL HIGH
Potassium: 3.4 — ABNORMAL LOW
Potassium: 3.4 — ABNORMAL LOW
Potassium: 4.1
Sodium: 138
Sodium: 140
Sodium: 140

## 2011-10-03 LAB — B-NATRIURETIC PEPTIDE (CONVERTED LAB): Pro B Natriuretic peptide (BNP): 235 — ABNORMAL HIGH

## 2011-10-03 LAB — CARDIAC PANEL(CRET KIN+CKTOT+MB+TROPI)
CK, MB: 1.7
CK, MB: 2.6
Relative Index: 1.4
Relative Index: 2
Total CK: 122
Troponin I: 0.02

## 2011-10-03 LAB — FOLATE: Folate: >20

## 2011-10-03 LAB — LIPASE, BLOOD: Lipase: 43

## 2011-10-03 LAB — CLOSTRIDIUM DIFFICILE EIA: C difficile Toxins A+B, EIA: NEGATIVE

## 2011-10-03 LAB — VITAMIN B12: Vitamin B-12: 687 (ref 211–911)

## 2011-10-04 LAB — OCCULT BLOOD X 1 CARD TO LAB, STOOL: Fecal Occult Bld: POSITIVE

## 2011-10-04 LAB — CARDIAC PANEL(CRET KIN+CKTOT+MB+TROPI)
Relative Index: 1.9
Total CK: 159

## 2011-10-04 LAB — URINALYSIS, MICROSCOPIC ONLY
Glucose, UA: NEGATIVE
Hgb urine dipstick: NEGATIVE
Leukocytes, UA: NEGATIVE
Protein, ur: 30 — AB
Specific Gravity, Urine: 1.041 — ABNORMAL HIGH
Urobilinogen, UA: 0.2

## 2011-10-04 LAB — CROSSMATCH: Antibody Screen: NEGATIVE

## 2011-10-04 LAB — CULTURE, BLOOD (ROUTINE X 2)

## 2011-10-04 LAB — BLOOD GAS, ARTERIAL
Bicarbonate: 23.4
Delivery systems: 1
Drawn by: 27734
FIO2: 1
O2 Content: 1
O2 Saturation: 97.3
Patient temperature: 98.6
pCO2 arterial: 40.2
pH, Arterial: 7.409
pO2, Arterial: 88.6

## 2011-10-04 LAB — POCT I-STAT CREATININE
Creatinine, Ser: 1.1
Operator id: 196461
Operator id: 284251

## 2011-10-04 LAB — CBC
MCHC: 33.3
MCV: 88
RBC: 4.3
RDW: 19.9 — ABNORMAL HIGH
WBC: 21.1 — ABNORMAL HIGH
WBC: 9.9

## 2011-10-04 LAB — POCT CARDIAC MARKERS
CKMB, poc: 2.2
CKMB, poc: <1 — ABNORMAL LOW
Myoglobin, poc: 167
Troponin i, poc: <0.05

## 2011-10-04 LAB — I-STAT 8, (EC8 V) (CONVERTED LAB)
Acid-Base Excess: 2
BUN: 23
Bicarbonate: 26.2 — ABNORMAL HIGH
Bicarbonate: 26.6 — ABNORMAL HIGH
Glucose, Bld: 188 — ABNORMAL HIGH
Hemoglobin: 14.3
Operator id: 196461
Potassium: 5.2 — ABNORMAL HIGH
Sodium: 133 — ABNORMAL LOW
TCO2: 27
TCO2: 28
pCO2, Ven: 36.8 — ABNORMAL LOW
pH, Ven: 7.467 — ABNORMAL HIGH

## 2011-10-04 LAB — DIFFERENTIAL
Basophils Relative: 0
Lymphocytes Relative: 10 — ABNORMAL LOW
Lymphs Abs: 2.1
Monocytes Relative: 14 — ABNORMAL HIGH
Neutro Abs: 15.9 — ABNORMAL HIGH
Neutrophils Relative %: 75

## 2011-10-04 LAB — BASIC METABOLIC PANEL
BUN: 17
Calcium: 7.6 — ABNORMAL LOW
Chloride: 106
Creatinine, Ser: 1.25
GFR calc Af Amer: >60
GFR calc non Af Amer: >60

## 2011-10-04 LAB — VALPROIC ACID LEVEL: Valproic Acid Lvl: 92.1

## 2011-10-04 LAB — D-DIMER, QUANTITATIVE: D-Dimer, Quant: 1.83 — ABNORMAL HIGH

## 2011-10-04 LAB — HEMOGLOBIN A1C: Mean Plasma Glucose: 115

## 2011-10-04 LAB — IRON AND TIBC
Iron: 31 — ABNORMAL LOW
TIBC: 306
UIBC: 275

## 2011-10-04 LAB — FERRITIN: Ferritin: 21 — ABNORMAL LOW (ref 22–322)

## 2011-10-04 LAB — RETICULOCYTES: Retic Count, Absolute: 133.3

## 2011-10-04 LAB — URINE CULTURE: Colony Count: 7000

## 2012-04-23 ENCOUNTER — Encounter: Payer: Self-pay | Admitting: Gastroenterology

## 2013-02-16 ENCOUNTER — Encounter (HOSPITAL_BASED_OUTPATIENT_CLINIC_OR_DEPARTMENT_OTHER): Payer: Self-pay | Admitting: *Deleted

## 2013-02-16 ENCOUNTER — Emergency Department (HOSPITAL_BASED_OUTPATIENT_CLINIC_OR_DEPARTMENT_OTHER)
Admission: EM | Admit: 2013-02-16 | Discharge: 2013-02-16 | Disposition: A | Discharge status: Home / Self Care | Payer: Medicare Other | Attending: Emergency Medicine | Admitting: Emergency Medicine

## 2013-02-16 ENCOUNTER — Emergency Department (HOSPITAL_BASED_OUTPATIENT_CLINIC_OR_DEPARTMENT_OTHER): Payer: Medicare Other

## 2013-02-16 DIAGNOSIS — R059 Cough, unspecified: Secondary | ICD-10-CM

## 2013-02-16 DIAGNOSIS — G40909 Epilepsy, unspecified, not intractable, without status epilepticus: Secondary | ICD-10-CM | POA: Insufficient documentation

## 2013-02-16 DIAGNOSIS — D509 Iron deficiency anemia, unspecified: Secondary | ICD-10-CM | POA: Insufficient documentation

## 2013-02-16 DIAGNOSIS — F209 Schizophrenia, unspecified: Secondary | ICD-10-CM | POA: Insufficient documentation

## 2013-02-16 DIAGNOSIS — K219 Gastro-esophageal reflux disease without esophagitis: Secondary | ICD-10-CM | POA: Insufficient documentation

## 2013-02-16 DIAGNOSIS — Z79899 Other long term (current) drug therapy: Secondary | ICD-10-CM | POA: Insufficient documentation

## 2013-02-16 DIAGNOSIS — Z8719 Personal history of other diseases of the digestive system: Secondary | ICD-10-CM | POA: Insufficient documentation

## 2013-02-16 HISTORY — DX: Gastro-esophageal reflux disease without esophagitis: K21.9

## 2013-02-16 HISTORY — DX: Iron deficiency anemia, unspecified: D50.9

## 2013-02-16 HISTORY — DX: Gastroparesis: K31.84

## 2013-02-16 HISTORY — DX: Unspecified convulsions: R56.9

## 2013-02-16 HISTORY — DX: Pure hypercholesterolemia, unspecified: E78.00

## 2013-02-16 HISTORY — DX: Schizophrenia, unspecified: F20.9

## 2013-02-16 HISTORY — DX: Barrett's esophagus without dysplasia: K22.70

## 2013-02-16 HISTORY — DX: Unspecified intellectual disabilities: F79

## 2013-02-16 HISTORY — DX: Dysphagia, unspecified: R13.10

## 2013-02-16 NOTE — ED Provider Notes (Signed)
History     CSN: [id number]  Arrival date & time 02/16/13  Ernestina Columbia   First MD Initiated Contact with Patient 02/16/13 1943      Chief Complaint  Patient presents with  . Follow-up    (Consider location/radiation/quality/duration/timing/severity/associated sxs/prior treatment) Patient is a 57 y.o. male presenting with cough. The history is provided by the patient and the nursing home. No language interpreter was used.  Cough Cough characteristics:  Non-productive Severity:  Mild Relieved by: antibiotics.  Pt is here for recheck of cough.   Pt was diagnosed with pneumonia on 2/11.   Group home request repeat chest xray to make sure pneumonia has resolved Past Medical History  Diagnosis Date  . Schizophrenia   . Mental retardation   . Seizure   . Barrett esophagus   . Dysphagia   . Iron deficiency anemia   . Hypercholesteremia   . GERD (gastroesophageal reflux disease)   . Gastroparesis     Past Surgical History  Procedure Laterality Date  . Craniotomy for cyst fenestration      No family history on file.  History  Substance Use Topics  . Smoking status: Never Smoker   . Smokeless tobacco: Not on file  . Alcohol Use: No      Review of Systems  Respiratory: Positive for cough.   All other systems reviewed and are negative.    Allergies  Vancomycin  Home Medications   Current Outpatient Rx  Name  Route  Sig  Dispense  Refill  . divalproex (DEPAKOTE) 500 MG DR tablet   Oral   Take 500 mg by mouth daily.         . fenofibrate 54 MG tablet   Oral   Take 54 mg by mouth daily.         . ferrous sulfate 325 (65 FE) MG tablet   Oral   Take 325 mg by mouth daily with breakfast.         . lamoTRIgine (LAMICTAL) 100 MG tablet   Oral   Take 100 mg by mouth daily.         Marland Kitchen levothyroxine (SYNTHROID, LEVOTHROID) 75 MCG tablet   Oral   Take 75 mcg by mouth daily.         Marland Kitchen omeprazole (PRILOSEC) 20 MG capsule   Oral   Take 20 mg by mouth  daily.         . QUEtiapine (SEROQUEL) 400 MG tablet   Oral   Take 400 mg by mouth at bedtime.         . Vitamin D, Ergocalciferol, (DRISDOL) 50000 UNITS CAPS   Oral   Take 50,000 Units by mouth every 30 (thirty) days.           There were no vitals taken for this visit.  Physical Exam  Constitutional: He appears well-developed and well-nourished.  HENT:  Head: Normocephalic.  Right Ear: External ear normal.  Left Ear: External ear normal.  Nose: Nose normal.  Mouth/Throat: Oropharynx is clear and moist.  Eyes: Conjunctivae are normal. Pupils are equal, round, and reactive to light.  Neck: Normal range of motion. Neck supple.  Cardiovascular: Normal rate and regular rhythm.   Pulmonary/Chest: Effort normal.  Abdominal: Soft.  Neurological: He is alert.  Skin: Skin is warm.  Psychiatric: He has a normal mood and affect.    ED Course  Procedures (including critical care time)  Labs Reviewed - No data to display Dg Chest 2 View  02/16/2013  *RADIOLOGY REPORT*  Clinical Data: Pneumonia, follow-up, history schizophrenia, mental retardation  CHEST - 2 VIEW  Comparison: 02/03/2013  Findings: Enlargement of cardiac silhouette. Tortuous thoracic aorta. Pulmonary vascularity normal. Improved left basilar infiltrate. Mild peribronchial thickening. Minimal atelectasis at right base. Upper lungs clear. No pleural effusion or pneumothorax.  IMPRESSION: Bronchitic changes with minimal right basilar atelectasis. Improved left lower lobe infiltrate.   Original Report Authenticated By: Ulyses Southward, M.D.      1. Cough       MDM  Chest xray shows pneumonia is resolving.   Pt waw seen and treated by Dr. Midge Aver.   I advised if he was concerned about complete clearing Chest xray should be repeated at 4 weeks.   Pt clinically looks good. No fever,   I think pneumonia is resolved/resolving I do not think pt needs further antibiotic treatment.  I advised monitor temperature and return if  any problems.       Lonia Skinner [place], [place] 02/16/13 2038  Lonia Skinner [place], [place] 02/16/13 [address] [place], [place] 02/16/13 2041

## 2013-02-16 NOTE — Discharge Instructions (Signed)

## 2013-02-16 NOTE — ED Notes (Signed)
Pt from group home- had pneumonia 2 weeks ago and needs f/u xray

## 2013-02-17 NOTE — ED Provider Notes (Signed)
Medical screening examination/treatment/procedure(s) were performed by non-physician practitioner and as supervising physician I was immediately available for consultation/collaboration.  Shemicka Cohrs, MD 02/17/13 0016 

## 2013-03-31 ENCOUNTER — Encounter: Payer: Self-pay | Admitting: Gastroenterology

## 2013-04-02 ENCOUNTER — Encounter: Payer: Self-pay | Admitting: Gastroenterology

## 2014-04-12 ENCOUNTER — Ambulatory Visit (INDEPENDENT_AMBULATORY_CARE_PROVIDER_SITE_OTHER): Payer: Medicare Other | Admitting: Podiatry

## 2014-04-12 ENCOUNTER — Encounter: Payer: Self-pay | Admitting: Podiatry

## 2014-04-12 ENCOUNTER — Ambulatory Visit (INDEPENDENT_AMBULATORY_CARE_PROVIDER_SITE_OTHER): Payer: Medicare Other

## 2014-04-12 VITALS — BP 81/59 | HR 80 | Resp 18

## 2014-04-12 DIAGNOSIS — S93609A Unspecified sprain of unspecified foot, initial encounter: Secondary | ICD-10-CM

## 2014-04-12 DIAGNOSIS — R52 Pain, unspecified: Secondary | ICD-10-CM

## 2014-04-12 NOTE — Progress Notes (Signed)
   Subjective:    Patient ID: [name], male    DOB: Apr 04, 1956, 58 y.o.   MRN: [id number]  HPI on my right foot this place his been bothering me for about 2 years and I kicked something and that is when it started to hurt. This patient presents with manager from group home [name]. He describes a vague history of trauma to the foot and very occasional episodes of right foot pain. He denies any pain in the right foot today.     Review of Systems  HENT: Positive for trouble swallowing.   Skin: Positive for color change.  Neurological: Positive for seizures.  All other systems reviewed and are negative.      Objective:   Physical Exam  58 year old black male is able to respond and answers questions directly today.  Vascular: DP and PT pulses 2/4 bilaterally  Neurological: Knee and ankle flex equal and reactive bilaterally  Dermatological: Skin texture and turgor within normal limits.  Musculoskeletal: The patient has a stable gait pattern. Mild hammering 2-5 bilaterally. There no areas of palpable tenderness upon range of motion in the right or left feet. There no areas of direct palpable tenderness in the right foot.   X-ray report right foot weightbearing  Intact bony structure without fracture or dislocation noted. Mild metatarsus adductus foot type.   Radiographic impression: No acute bony abnormality noted      Assessment & Plan:  Assessment: No acute bony abnormality noted right foot Vague history of of trauma to right foot Vague history of foot strain right foot  Plan: At this time I advised the patient and his accompanying representative that I could not find any specific reason for his foot pain at this time. I advised patient and representative to observe this patient and return if the symptoms became more pronounced and more consistent for further evaluation.

## 2014-04-13 ENCOUNTER — Encounter: Payer: Self-pay | Admitting: Podiatry

## 2015-01-07 ENCOUNTER — Emergency Department (HOSPITAL_BASED_OUTPATIENT_CLINIC_OR_DEPARTMENT_OTHER): Payer: Medicare Other

## 2015-01-07 ENCOUNTER — Emergency Department (HOSPITAL_BASED_OUTPATIENT_CLINIC_OR_DEPARTMENT_OTHER)
Admission: EM | Admit: 2015-01-07 | Discharge: 2015-01-07 | Disposition: A | Discharge status: Home / Self Care | Payer: Medicare Other | Source: Home / Self Care | Attending: Emergency Medicine | Admitting: Emergency Medicine

## 2015-01-07 ENCOUNTER — Encounter (HOSPITAL_BASED_OUTPATIENT_CLINIC_OR_DEPARTMENT_OTHER): Payer: Self-pay | Admitting: *Deleted

## 2015-01-07 DIAGNOSIS — G40909 Epilepsy, unspecified, not intractable, without status epilepticus: Secondary | ICD-10-CM | POA: Insufficient documentation

## 2015-01-07 DIAGNOSIS — R197 Diarrhea, unspecified: Secondary | ICD-10-CM | POA: Insufficient documentation

## 2015-01-07 DIAGNOSIS — E780 Pure hypercholesterolemia: Secondary | ICD-10-CM | POA: Insufficient documentation

## 2015-01-07 DIAGNOSIS — K922 Gastrointestinal hemorrhage, unspecified: Secondary | ICD-10-CM | POA: Diagnosis not present

## 2015-01-07 DIAGNOSIS — E860 Dehydration: Secondary | ICD-10-CM | POA: Insufficient documentation

## 2015-01-07 DIAGNOSIS — Z79899 Other long term (current) drug therapy: Secondary | ICD-10-CM | POA: Insufficient documentation

## 2015-01-07 DIAGNOSIS — K219 Gastro-esophageal reflux disease without esophagitis: Secondary | ICD-10-CM | POA: Insufficient documentation

## 2015-01-07 DIAGNOSIS — K210 Gastro-esophageal reflux disease with esophagitis: Secondary | ICD-10-CM | POA: Diagnosis not present

## 2015-01-07 DIAGNOSIS — D509 Iron deficiency anemia, unspecified: Secondary | ICD-10-CM | POA: Insufficient documentation

## 2015-01-07 DIAGNOSIS — F209 Schizophrenia, unspecified: Secondary | ICD-10-CM | POA: Insufficient documentation

## 2015-01-07 LAB — CBC WITH DIFFERENTIAL/PLATELET
Basophils Absolute: 0 10*3/uL (ref 0.0–0.1)
Basophils Relative: 0 % (ref 0–1)
Eosinophils Absolute: 0 10*3/uL (ref 0.0–0.7)
Eosinophils Relative: 0 % (ref 0–5)
HCT: 38.8 % — ABNORMAL LOW (ref 39.0–52.0)
Hemoglobin: 12.7 g/dL — ABNORMAL LOW (ref 13.0–17.0)
LYMPHS PCT: 16 % (ref 12–46)
Lymphs Abs: 1.3 10*3/uL (ref 0.7–4.0)
MCH: 30.2 pg (ref 26.0–34.0)
MCHC: 32.7 g/dL (ref 30.0–36.0)
MCV: 92.4 fL (ref 78.0–100.0)
MONOS PCT: 8 % (ref 3–12)
Monocytes Absolute: 0.6 10*3/uL (ref 0.1–1.0)
Neutro Abs: 6.1 10*3/uL (ref 1.7–7.7)
Neutrophils Relative %: 76 % (ref 43–77)
Platelets: 196 10*3/uL (ref 150–400)
RBC: 4.2 MIL/uL — ABNORMAL LOW (ref 4.22–5.81)
RDW: 11.9 % (ref 11.5–15.5)
WBC: 8.1 10*3/uL (ref 4.0–10.5)

## 2015-01-07 LAB — URINALYSIS, ROUTINE W REFLEX MICROSCOPIC
Bilirubin Urine: NEGATIVE
GLUCOSE, UA: NEGATIVE mg/dL
Hgb urine dipstick: NEGATIVE
Ketones, ur: 15 mg/dL — AB
Leukocytes, UA: NEGATIVE
NITRITE: NEGATIVE
PH: 6 (ref 5.0–8.0)
Protein, ur: NEGATIVE mg/dL
Specific Gravity, Urine: 1.028 (ref 1.005–1.030)
UROBILINOGEN UA: 0.2 mg/dL (ref 0.0–1.0)

## 2015-01-07 LAB — BASIC METABOLIC PANEL
ANION GAP: 8 (ref 5–15)
BUN: 50 mg/dL — ABNORMAL HIGH (ref 6–23)
CO2: 21 mmol/L (ref 19–32)
Calcium: 7.3 mg/dL — ABNORMAL LOW (ref 8.4–10.5)
Chloride: 109 mEq/L (ref 96–112)
Creatinine, Ser: 1.04 mg/dL (ref 0.50–1.35)
GFR calc non Af Amer: 78 mL/min — ABNORMAL LOW (ref >90)
Glucose, Bld: 130 mg/dL — ABNORMAL HIGH (ref 70–99)
Potassium: 4.3 mmol/L (ref 3.5–5.1)
SODIUM: 138 mmol/L (ref 135–145)

## 2015-01-07 LAB — TROPONIN I: Troponin I: <0.03 ng/mL (ref <0.031)

## 2015-01-07 MED ORDER — SODIUM CHLORIDE 0.9 % IV BOLUS (SEPSIS)
1000.0000 mL | Freq: Once | INTRAVENOUS | Status: AC
  Administered 2015-01-07: 1000 mL via INTRAVENOUS

## 2015-01-07 NOTE — Discharge Instructions (Signed)
Continue drinking fluids regularly to stay hydrated. May wish to start with bland diet and progress back to normal as tolerated. Return to the ED for new or worsening symptoms.

## 2015-01-07 NOTE — ED Provider Notes (Signed)
CSN: [id number]     Arrival date & time 01/07/15  1221 History   First MD Initiated Contact with Patient 01/07/15 1223     Chief Complaint  Patient presents with  . Diarrhea  . Dizziness     (Consider location/radiation/quality/duration/timing/severity/associated sxs/prior Treatment) Patient is a 59 y.o. male presenting with diarrhea and dizziness. The history is provided by the patient and medical records.  Diarrhea Dizziness Associated symptoms: diarrhea     This is a 59 y.o. M with PMH significant for schizophrenia, mental retardation, seizure disorder, dysphagia, GERD, hypercholesterolemia, presenting to the ED for diarrhea and generalized weakness.  Initially family member reported dizziness, but patient states he just feels "weak all over".  Diarrhea has been ongoing for 2 days, no-bloody and watery in appearance.  No abdominal pain, nausea, or vomiting.  Patient does attend a day enrichment center with approx 70 people so there have been multiple potential sick contacts.  No changes in diet, travel, or abx use.  No fever, chills, sweats.  Mother has had patient on clear liquid diet for the past 24 hours, some improvement noted.  States she brought him in today because nurse at his day center was concerned he may be somewhat dehydrated.  Past Medical History  Diagnosis Date  . Schizophrenia   . Mental retardation   . Seizure   . Barrett esophagus   . Dysphagia   . Iron deficiency anemia   . Hypercholesteremia   . GERD (gastroesophageal reflux disease)   . Gastroparesis    Past Surgical History  Procedure Laterality Date  . Craniotomy for cyst fenestration     No family history on file. History  Substance Use Topics  . Smoking status: Never Smoker   . Smokeless tobacco: Never Used  . Alcohol Use: No    Review of Systems  Gastrointestinal: Positive for diarrhea.  Neurological: Positive for dizziness.      Allergies  Vancomycin  Home Medications   Prior to  Admission medications   Medication Sig Start Date End Date Taking? Authorizing Provider  divalproex (DEPAKOTE) 500 MG DR tablet Take 500 mg by mouth daily.    Historical Provider, MD  fenofibrate 54 MG tablet Take 54 mg by mouth daily.    Historical Provider, MD  ferrous sulfate 325 (65 FE) MG tablet Take 325 mg by mouth daily with breakfast.    Historical Provider, MD  lamoTRIgine (LAMICTAL) 100 MG tablet Take 100 mg by mouth daily.    Historical Provider, MD  levothyroxine (SYNTHROID, LEVOTHROID) 75 MCG tablet Take 75 mcg by mouth daily.    Historical Provider, MD  lovastatin (MEVACOR) 20 MG tablet Take 20 mg by mouth at bedtime.    Historical Provider, MD  omeprazole (PRILOSEC) 20 MG capsule Take 20 mg by mouth daily.    Historical Provider, MD  propranolol (INDERAL) 10 MG tablet Take 10 mg by mouth 3 (three) times daily.    Historical Provider, MD  QUEtiapine (SEROQUEL) 400 MG tablet Take 400 mg by mouth at bedtime.    Historical Provider, MD  Vitamin D, Ergocalciferol, (DRISDOL) 50000 UNITS CAPS Take 50,000 Units by mouth every 30 (thirty) days.    Historical Provider, MD   BP 162/92 mmHg  Pulse 94  Temp(Src) 98.1 F (36.7 C) (Oral)  Resp 18  SpO2 92%   Physical Exam  Constitutional: He is oriented to person, place, and time. He appears well-developed and well-nourished. No distress.  HENT:  Head: Normocephalic and atraumatic.  Mouth/Throat:  Oropharynx is clear and moist.  Mildly dry mucous membranes  Eyes: Conjunctivae and EOM are normal. Pupils are equal, round, and reactive to light.  Neck: Normal range of motion. Neck supple.  Cardiovascular: Normal rate, regular rhythm and normal heart sounds.   Pulmonary/Chest: Effort normal and breath sounds normal. No respiratory distress. He has no wheezes.  Abdominal: Soft. Bowel sounds are normal. There is no tenderness. There is no guarding.  Musculoskeletal: Normal range of motion.  Neurological: He is alert and oriented to person,  place, and time.  Skin: Skin is warm and dry. He is not diaphoretic.  Adequate skin turgor  Psychiatric: He has a normal mood and affect.  Nursing note and vitals reviewed.   ED Course  Procedures (including critical care time) Labs Review Labs Reviewed  URINALYSIS, ROUTINE W REFLEX MICROSCOPIC - Abnormal; Notable for the following:    Ketones, ur 15 (*)    All other components within normal limits  CBC WITH DIFFERENTIAL - Abnormal; Notable for the following:    RBC 4.20 (*)    Hemoglobin 12.7 (*)    HCT 38.8 (*)    All other components within normal limits  BASIC METABOLIC PANEL - Abnormal; Notable for the following:    Glucose, Bld 130 (*)    BUN 50 (*)    Calcium 7.3 (*)    GFR calc non Af Amer 78 (*)    All other components within normal limits  TROPONIN I    Imaging Review Dg Abd Acute W/chest  01/07/2015   CLINICAL DATA:  Diarrhea for 2 days, weakness.  EXAM: ACUTE ABDOMEN SERIES (ABDOMEN 2 VIEW & CHEST 1 VIEW)  COMPARISON:  02/16/2013  FINDINGS: Moderate-sized hiatal hernia. Heart is normal size. Minimal left base atelectasis. Right lung is clear. No effusions.  Nonobstructive bowel gas pattern. No free air. No organomegaly or suspicious calcification. No acute bony abnormality.  IMPRESSION: No acute findings.  Moderate-sized hiatal hernia.  No active cardiopulmonary disease.   Electronically Signed   By: Charlett Nose M.D.   On: 01/07/2015 14:53     EKG Interpretation   Date/Time:  Friday January 07 2015 13:02:21 EST Ventricular Rate:  92 PR Interval:  120 QRS Duration: 66 QT Interval:  342 QTC Calculation: 422 R Axis:   62 Text Interpretation:  Normal sinus rhythm Normal ECG no change from ECG  dated 05/19/08 Confirmed by DELOS  MD, DOUGLAS ([id number]) on 01/07/2015 1:11:20  PM      MDM   Final diagnoses:  Diarrhea  Dehydration, mild   59 year old male with diarrhea times Tuesdays and reported dizziness, however patient states he just feels generally weak. On  exam, patient afebrile and nontoxic in appearance. His mucous membranes are mildly dry but he has adequate skin turgor. His abdominal exam is benign. Will obtain basic labs, troponin, and EKG. Fluid bolus given.  EKG NSR without ischemic changes.  Troponin negative.  Lab work with elevated BUN, SrCr remains WNL.  No significant electrolyte imbalance.  Acute abdominal series negative.  Patient feeling better after fluids.  He has tolerated 2 containers of orange juice without difficulty.  No episodes of diarrhea while in the ED, VS remain stable.  Feel patient is stable for discharge.  Encouraged to continue fluids at home.  FU with PCP.  Discussed plan with patient, he/she acknowledged understanding and agreed with plan of care.  Return precautions given for new or worsening symptoms.  Garlon Hatchet, PA-C 01/07/15 1536  Geoffery Lyons,  MD 01/09/15 [id number]

## 2015-01-07 NOTE — ED Notes (Signed)
Brought from a group with diarrhea and dizziness. R/o dehydration.

## 2015-01-08 ENCOUNTER — Inpatient Hospital Stay (HOSPITAL_COMMUNITY)
Admission: EM | Admit: 2015-01-08 | Discharge: 2015-01-11 | DRG: 392 | Disposition: A | Discharge status: Home Health | Payer: Medicare Other | Attending: Internal Medicine | Admitting: Internal Medicine

## 2015-01-08 ENCOUNTER — Encounter (HOSPITAL_COMMUNITY): Payer: Self-pay | Admitting: Cardiology

## 2015-01-08 ENCOUNTER — Emergency Department (HOSPITAL_COMMUNITY): Payer: Medicare Other

## 2015-01-08 DIAGNOSIS — D62 Acute posthemorrhagic anemia: Secondary | ICD-10-CM | POA: Diagnosis present

## 2015-01-08 DIAGNOSIS — Z881 Allergy status to other antibiotic agents status: Secondary | ICD-10-CM

## 2015-01-08 DIAGNOSIS — R569 Unspecified convulsions: Secondary | ICD-10-CM

## 2015-01-08 DIAGNOSIS — K209 Esophagitis, unspecified without bleeding: Secondary | ICD-10-CM

## 2015-01-08 DIAGNOSIS — D509 Iron deficiency anemia, unspecified: Secondary | ICD-10-CM | POA: Diagnosis present

## 2015-01-08 DIAGNOSIS — R0602 Shortness of breath: Secondary | ICD-10-CM | POA: Diagnosis not present

## 2015-01-08 DIAGNOSIS — F329 Major depressive disorder, single episode, unspecified: Secondary | ICD-10-CM | POA: Diagnosis present

## 2015-01-08 DIAGNOSIS — F209 Schizophrenia, unspecified: Secondary | ICD-10-CM | POA: Diagnosis present

## 2015-01-08 DIAGNOSIS — E780 Pure hypercholesterolemia: Secondary | ICD-10-CM | POA: Diagnosis present

## 2015-01-08 DIAGNOSIS — K219 Gastro-esophageal reflux disease without esophagitis: Secondary | ICD-10-CM | POA: Diagnosis present

## 2015-01-08 DIAGNOSIS — R112 Nausea with vomiting, unspecified: Secondary | ICD-10-CM | POA: Diagnosis present

## 2015-01-08 DIAGNOSIS — K922 Gastrointestinal hemorrhage, unspecified: Secondary | ICD-10-CM | POA: Diagnosis present

## 2015-01-08 DIAGNOSIS — K3184 Gastroparesis: Secondary | ICD-10-CM | POA: Diagnosis present

## 2015-01-08 DIAGNOSIS — Z8719 Personal history of other diseases of the digestive system: Secondary | ICD-10-CM

## 2015-01-08 DIAGNOSIS — K210 Gastro-esophageal reflux disease with esophagitis: Principal | ICD-10-CM

## 2015-01-08 DIAGNOSIS — F70 Mild intellectual disabilities: Secondary | ICD-10-CM | POA: Diagnosis present

## 2015-01-08 DIAGNOSIS — K2961 Other gastritis with bleeding: Secondary | ICD-10-CM | POA: Insufficient documentation

## 2015-01-08 DIAGNOSIS — G40909 Epilepsy, unspecified, not intractable, without status epilepticus: Secondary | ICD-10-CM | POA: Diagnosis present

## 2015-01-08 DIAGNOSIS — E785 Hyperlipidemia, unspecified: Secondary | ICD-10-CM | POA: Diagnosis present

## 2015-01-08 DIAGNOSIS — I10 Essential (primary) hypertension: Secondary | ICD-10-CM | POA: Diagnosis present

## 2015-01-08 DIAGNOSIS — E039 Hypothyroidism, unspecified: Secondary | ICD-10-CM | POA: Diagnosis present

## 2015-01-08 DIAGNOSIS — K449 Diaphragmatic hernia without obstruction or gangrene: Secondary | ICD-10-CM | POA: Diagnosis present

## 2015-01-08 DIAGNOSIS — R197 Diarrhea, unspecified: Secondary | ICD-10-CM | POA: Insufficient documentation

## 2015-01-08 DIAGNOSIS — G9341 Metabolic encephalopathy: Secondary | ICD-10-CM

## 2015-01-08 DIAGNOSIS — F32A Depression, unspecified: Secondary | ICD-10-CM | POA: Diagnosis present

## 2015-01-08 DIAGNOSIS — K2270 Barrett's esophagus without dysplasia: Secondary | ICD-10-CM | POA: Diagnosis present

## 2015-01-08 HISTORY — DX: Gastrointestinal hemorrhage, unspecified: K92.2

## 2015-01-08 LAB — COMPREHENSIVE METABOLIC PANEL
ALT: 19 U/L (ref 0–53)
AST: 21 U/L (ref 0–37)
Albumin: 3.2 g/dL — ABNORMAL LOW (ref 3.5–5.2)
Alkaline Phosphatase: 44 U/L (ref 39–117)
Anion gap: 5 (ref 5–15)
BUN: 31 mg/dL — ABNORMAL HIGH (ref 6–23)
CO2: 31 mmol/L (ref 19–32)
CREATININE: 1.21 mg/dL (ref 0.50–1.35)
Calcium: 8.5 mg/dL (ref 8.4–10.5)
Chloride: 105 mEq/L (ref 96–112)
GFR calc Af Amer: 75 mL/min — ABNORMAL LOW (ref >90)
GFR, EST NON AFRICAN AMERICAN: 65 mL/min — AB (ref >90)
GLUCOSE: 129 mg/dL — AB (ref 70–99)
POTASSIUM: 4 mmol/L (ref 3.5–5.1)
SODIUM: 141 mmol/L (ref 135–145)
Total Bilirubin: 0.5 mg/dL (ref 0.3–1.2)
Total Protein: 5.7 g/dL — ABNORMAL LOW (ref 6.0–8.3)

## 2015-01-08 LAB — CBC
HEMATOCRIT: 21.9 % — AB (ref 39.0–52.0)
Hemoglobin: 7.3 g/dL — ABNORMAL LOW (ref 13.0–17.0)
MCH: 29.7 pg (ref 26.0–34.0)
MCHC: 33.3 g/dL (ref 30.0–36.0)
MCV: 89 fL (ref 78.0–100.0)
PLATELETS: 158 10*3/uL (ref 150–400)
RBC: 2.46 MIL/uL — AB (ref 4.22–5.81)
RDW: 12.4 % (ref 11.5–15.5)
WBC: 5.4 10*3/uL (ref 4.0–10.5)

## 2015-01-08 LAB — CBC WITH DIFFERENTIAL/PLATELET
Basophils Absolute: 0 10*3/uL (ref 0.0–0.1)
Basophils Relative: 0 % (ref 0–1)
EOS ABS: 0 10*3/uL (ref 0.0–0.7)
EOS PCT: 1 % (ref 0–5)
HEMATOCRIT: 26.5 % — AB (ref 39.0–52.0)
HEMOGLOBIN: 9 g/dL — AB (ref 13.0–17.0)
LYMPHS PCT: 24 % (ref 12–46)
Lymphs Abs: 1.5 10*3/uL (ref 0.7–4.0)
MCH: 30.2 pg (ref 26.0–34.0)
MCHC: 34 g/dL (ref 30.0–36.0)
MCV: 88.9 fL (ref 78.0–100.0)
Monocytes Absolute: 0.7 10*3/uL (ref 0.1–1.0)
Monocytes Relative: 11 % (ref 3–12)
NEUTROS ABS: 4 10*3/uL (ref 1.7–7.7)
Neutrophils Relative %: 64 % (ref 43–77)
Platelets: 184 10*3/uL (ref 150–400)
RBC: 2.98 MIL/uL — AB (ref 4.22–5.81)
RDW: 12.3 % (ref 11.5–15.5)
WBC: 6.2 10*3/uL (ref 4.0–10.5)

## 2015-01-08 LAB — LIPASE, BLOOD: LIPASE: 22 U/L (ref 11–59)

## 2015-01-08 LAB — TROPONIN I: Troponin I: <0.03 ng/mL (ref <0.031)

## 2015-01-08 LAB — URINALYSIS, ROUTINE W REFLEX MICROSCOPIC
Bilirubin Urine: NEGATIVE
Glucose, UA: NEGATIVE mg/dL
HGB URINE DIPSTICK: NEGATIVE
Ketones, ur: 15 mg/dL — AB
LEUKOCYTES UA: NEGATIVE
NITRITE: NEGATIVE
Protein, ur: NEGATIVE mg/dL
SPECIFIC GRAVITY, URINE: 1.025 (ref 1.005–1.030)
Urobilinogen, UA: 0.2 mg/dL (ref 0.0–1.0)
pH: 6 (ref 5.0–8.0)

## 2015-01-08 LAB — I-STAT CG4 LACTIC ACID, ED: Lactic Acid, Venous: 1.75 mmol/L (ref 0.5–2.2)

## 2015-01-08 LAB — APTT: aPTT: 20 seconds — ABNORMAL LOW (ref 24–37)

## 2015-01-08 LAB — MRSA PCR SCREENING: MRSA by PCR: NEGATIVE

## 2015-01-08 LAB — POC OCCULT BLOOD, ED: Fecal Occult Bld: POSITIVE — AB

## 2015-01-08 LAB — PROTIME-INR
INR: 1.07 (ref 0.00–1.49)
PROTHROMBIN TIME: 14.1 s (ref 11.6–15.2)

## 2015-01-08 MED ORDER — FENOFIBRATE 54 MG PO TABS: 54.0000 mg | ORAL_TABLET | Freq: Every day | ORAL | Status: DC

## 2015-01-08 MED ORDER — PROPRANOLOL HCL 10 MG PO TABS
10.0000 mg | ORAL_TABLET | Freq: Three times a day (TID) | ORAL | Status: DC
  Administered 2015-01-08 – 2015-01-11 (×6): 10 mg via ORAL
  Filled 2015-01-08 (×10): qty 1

## 2015-01-08 MED ORDER — DIVALPROEX SODIUM 500 MG PO DR TAB
500.0000 mg | DELAYED_RELEASE_TABLET | Freq: Every day | ORAL | Status: DC
  Administered 2015-01-09 – 2015-01-11 (×3): 500 mg via ORAL
  Filled 2015-01-08 (×4): qty 1

## 2015-01-08 MED ORDER — SODIUM CHLORIDE 0.9 % IV SOLN
1000.0000 mL | Freq: Once | INTRAVENOUS | Status: AC
  Administered 2015-01-08: 1000 mL via INTRAVENOUS

## 2015-01-08 MED ORDER — PANTOPRAZOLE SODIUM 40 MG IV SOLR: 40.0000 mg | Freq: Once | INTRAVENOUS | Status: DC

## 2015-01-08 MED ORDER — PANTOPRAZOLE SODIUM 40 MG IV SOLR
40.0000 mg | Freq: Two times a day (BID) | INTRAVENOUS | Status: DC
  Administered 2015-01-08 – 2015-01-09 (×2): 40 mg via INTRAVENOUS
  Filled 2015-01-08 (×3): qty 40

## 2015-01-08 MED ORDER — SODIUM CHLORIDE 0.9 % IV SOLN
1000.0000 mL | INTRAVENOUS | Status: DC
  Administered 2015-01-09: 1000 mL via INTRAVENOUS

## 2015-01-08 MED ORDER — ACETAMINOPHEN 325 MG PO TABS: 650.0000 mg | ORAL_TABLET | Freq: Four times a day (QID) | ORAL | Status: DC | PRN

## 2015-01-08 MED ORDER — SODIUM CHLORIDE 0.9 % IJ SOLN
3.0000 mL | Freq: Two times a day (BID) | INTRAMUSCULAR | Status: DC
  Administered 2015-01-09 – 2015-01-10 (×3): 3 mL via INTRAVENOUS

## 2015-01-08 MED ORDER — LEVOTHYROXINE SODIUM 75 MCG PO TABS
75.0000 ug | ORAL_TABLET | Freq: Every day | ORAL | Status: DC
  Administered 2015-01-09 – 2015-01-11 (×3): 75 ug via ORAL
  Filled 2015-01-08 (×4): qty 1

## 2015-01-08 MED ORDER — SODIUM CHLORIDE 0.9 % IV SOLN
80.0000 mg | Freq: Once | INTRAVENOUS | Status: AC
  Administered 2015-01-08: 80 mg via INTRAVENOUS
  Filled 2015-01-08: qty 80

## 2015-01-08 MED ORDER — QUETIAPINE FUMARATE 400 MG PO TABS: 400.0000 mg | ORAL_TABLET | Freq: Every day | ORAL | Status: DC

## 2015-01-08 MED ORDER — ONDANSETRON HCL 4 MG/2ML IJ SOLN
4.0000 mg | Freq: Once | INTRAMUSCULAR | Status: AC
  Administered 2015-01-08: 4 mg via INTRAVENOUS
  Filled 2015-01-08: qty 2

## 2015-01-08 MED ORDER — ONDANSETRON HCL 4 MG/2ML IJ SOLN: 4.0000 mg | Freq: Three times a day (TID) | INTRAMUSCULAR | Status: DC | PRN

## 2015-01-08 MED ORDER — FERROUS SULFATE 325 (65 FE) MG PO TABS
325.0000 mg | ORAL_TABLET | Freq: Every day | ORAL | Status: DC
  Administered 2015-01-09 – 2015-01-10 (×2): 325 mg via ORAL
  Filled 2015-01-08 (×3): qty 1

## 2015-01-08 MED ORDER — SODIUM CHLORIDE 0.9 % IV SOLN
INTRAVENOUS | Status: AC
  Administered 2015-01-08: 19:00:00 via INTRAVENOUS

## 2015-01-08 MED ORDER — LAMOTRIGINE 100 MG PO TABS
100.0000 mg | ORAL_TABLET | Freq: Every day | ORAL | Status: DC
  Administered 2015-01-09 – 2015-01-11 (×3): 100 mg via ORAL
  Filled 2015-01-08 (×3): qty 1

## 2015-01-08 MED ORDER — QUETIAPINE FUMARATE 400 MG PO TABS
400.0000 mg | ORAL_TABLET | Freq: Every day | ORAL | Status: DC
  Administered 2015-01-08 – 2015-01-09 (×2): 400 mg via ORAL
  Filled 2015-01-08 (×4): qty 1

## 2015-01-08 MED ORDER — SODIUM CHLORIDE 0.9 % IV BOLUS (SEPSIS)
1000.0000 mL | Freq: Once | INTRAVENOUS | Status: AC
  Administered 2015-01-08: 1000 mL via INTRAVENOUS

## 2015-01-08 MED ORDER — ACETAMINOPHEN 650 MG RE SUPP: 650.0000 mg | Freq: Four times a day (QID) | RECTAL | Status: DC | PRN

## 2015-01-08 NOTE — ED Provider Notes (Signed)
CSN: [id number]     Arrival date & time 01/08/15  1303 History   First MD Initiated Contact with Patient 01/08/15 1638     Chief Complaint  Patient presents with  . Nausea  . Emesis  . Weakness     (Consider location/radiation/quality/duration/timing/severity/associated sxs/prior Treatment) HPI Comments: Patient is a 59 year old male past medical history significant for schizophrenia, mental retardation, seizure disorder, anemia, GERD, gastroparesis, Barrett's esophagus, history of GI bleeding presenting to the emergency department from a group home for evaluation of continued nausea, black coffee ground appearing emesis and diarrhea since today. Patient has had 2 days prior of nonbloody nonbilious emesis and diarrhea. No abdominal surgical history. Patient is a level V caveat d/t MR.   Patient is a 59 y.o. male presenting with vomiting and weakness.  Emesis Associated symptoms: diarrhea   Weakness Associated symptoms include nausea, vomiting and weakness.    Past Medical History  Diagnosis Date  . Schizophrenia   . Mental retardation   . Seizure   . Barrett esophagus   . Dysphagia   . Iron deficiency anemia   . Hypercholesteremia   . GERD (gastroesophageal reflux disease)   . Gastroparesis    Past Surgical History  Procedure Laterality Date  . Craniotomy for cyst fenestration     History reviewed. No pertinent family history. History  Substance Use Topics  . Smoking status: Never Smoker   . Smokeless tobacco: Never Used  . Alcohol Use: No    Review of Systems  Unable to perform ROS: Other  Gastrointestinal: Positive for nausea, vomiting and diarrhea.  Neurological: Positive for weakness.      Allergies  Vancomycin  Home Medications   Prior to Admission medications   Medication Sig Start Date End Date Taking? Authorizing Provider  divalproex (DEPAKOTE) 500 MG DR tablet Take 500 mg by mouth daily.   Yes Historical Provider, MD  ferrous sulfate 325 (65 FE)  MG tablet Take 325 mg by mouth daily with breakfast.   Yes Historical Provider, MD  lamoTRIgine (LAMICTAL) 100 MG tablet Take 100 mg by mouth daily.   Yes Historical Provider, MD  levothyroxine (SYNTHROID, LEVOTHROID) 75 MCG tablet Take 75 mcg by mouth daily.   Yes Historical Provider, MD  lovastatin (MEVACOR) 20 MG tablet Take 20 mg by mouth at bedtime.   Yes Historical Provider, MD  omeprazole (PRILOSEC) 20 MG capsule Take 20 mg by mouth daily.   Yes Historical Provider, MD  propranolol (INDERAL) 10 MG tablet Take 10 mg by mouth 3 (three) times daily.   Yes Historical Provider, MD  QUEtiapine (SEROQUEL) 400 MG tablet Take 400 mg by mouth at bedtime.   Yes Historical Provider, MD  Vitamin D, Ergocalciferol, (DRISDOL) 50000 UNITS CAPS Take 50,000 Units by mouth every 30 (thirty) days.   Yes Historical Provider, MD   BP 88/41 mmHg  Pulse 89  Temp(Src) 98 F (36.7 C) (Oral)  Resp 27  Ht  (1.6 m)  Wt 153 lb 3.5 oz (69.5 kg)  BMI 27.15 kg/m2  SpO2 94% Physical Exam  Constitutional: He is oriented to person, place, and time. He appears well-developed and well-nourished.  HENT:  Head: Normocephalic and atraumatic.  Right Ear: External ear normal.  Left Ear: External ear normal.  Nose: Nose normal.  Eyes: Conjunctivae are normal.  Bilateral conjunctival pallor  Neck: Neck supple.  Cardiovascular: Regular rhythm and normal heart sounds.  Tachycardia present.   Pulmonary/Chest: Effort normal and breath sounds normal.  Abdominal: Soft. There  is no tenderness.  Genitourinary: Guaiac positive stool.  Black stool on DRE  Musculoskeletal: He exhibits no edema.  Neurological: He is alert and oriented to person, place, and time.  Skin: Skin is warm and dry. There is pallor.    ED Course  Procedures (including critical care time) Medications  0.9 %  sodium chloride infusion (1,000 mLs Intravenous New Bag/Given 01/08/15 1849)    Followed by  0.9 %  sodium chloride infusion (not  administered)  0.9 %  sodium chloride infusion ( Intravenous New Bag/Given 01/08/15 1915)  ondansetron (ZOFRAN) injection 4 mg (not administered)  propranolol (INDERAL) tablet 10 mg (10 mg Oral Given 01/08/15 2159)  divalproex (DEPAKOTE) DR tablet 500 mg (500 mg Oral Not Given 01/08/15 2156)  ferrous sulfate tablet 325 mg (not administered)  lamoTRIgine (LAMICTAL) tablet 100 mg (not administered)  levothyroxine (SYNTHROID, LEVOTHROID) tablet 75 mcg (not administered)  pantoprazole (PROTONIX) injection 40 mg (40 mg Intravenous Given 01/08/15 2205)  sodium chloride 0.9 % injection 3 mL (not administered)  acetaminophen (TYLENOL) tablet 650 mg (not administered)    Or  acetaminophen (TYLENOL) suppository 650 mg (not administered)  QUEtiapine (SEROQUEL) tablet 400 mg (400 mg Oral Given 01/08/15 2230)  sodium chloride 0.9 % bolus 1,000 mL (1,000 mLs Intravenous New Bag/Given 01/08/15 1723)  ondansetron (ZOFRAN) injection 4 mg (4 mg Intravenous Given 01/08/15 1804)  pantoprazole (PROTONIX) 80 mg in sodium chloride 0.9 % 100 mL IVPB (0 mg Intravenous Stopped 01/08/15 1844)    Labs Review Labs Reviewed  CBC WITH DIFFERENTIAL - Abnormal; Notable for the following:    RBC 2.98 (*)    Hemoglobin 9.0 (*)    HCT 26.5 (*)    All other components within normal limits  COMPREHENSIVE METABOLIC PANEL - Abnormal; Notable for the following:    Glucose, Bld 129 (*)    BUN 31 (*)    Total Protein 5.7 (*)    Albumin 3.2 (*)    GFR calc non Af Amer 65 (*)    GFR calc Af Amer 75 (*)    All other components within normal limits  URINALYSIS, ROUTINE W REFLEX MICROSCOPIC - Abnormal; Notable for the following:    Ketones, ur 15 (*)    All other components within normal limits  APTT - Abnormal; Notable for the following:    aPTT 20 (*)    All other components within normal limits  CBC - Abnormal; Notable for the following:    RBC 2.46 (*)    Hemoglobin 7.3 (*)    HCT 21.9 (*)    All other components within  normal limits  POC OCCULT BLOOD, ED - Abnormal; Notable for the following:    Fecal Occult Bld POSITIVE (*)    All other components within normal limits  MRSA PCR SCREENING  URINE CULTURE  CLOSTRIDIUM DIFFICILE BY PCR  TROPONIN I  LIPASE, BLOOD  PROTIME-INR  TROPONIN I  CBC  CBC  HIV ANTIBODY (ROUTINE TESTING)  URINE RAPID DRUG SCREEN (HOSP PERFORMED)  VALPROIC ACID LEVEL  GI PATHOGEN PANEL BY PCR, STOOL  TROPONIN I  TROPONIN I  COMPREHENSIVE METABOLIC PANEL  CBC  CBC  CBC  I-STAT TROPOININ, ED  I-STAT CG4 LACTIC ACID, ED  TYPE AND SCREEN    Imaging Review Dg Chest Portable 1 View  01/08/2015   CLINICAL DATA:  Nausea, vomiting, weakness, diarrhea for a few days.  EXAM: PORTABLE CHEST - 1 VIEW  COMPARISON:  01/07/2015  FINDINGS: The heart size and mediastinal  contours are within normal limits. Both lungs are clear. The visualized skeletal structures are unremarkable.  IMPRESSION: No active disease.   Electronically Signed   By: Charlett Nose M.D.   On: 01/08/2015 18:55   Dg Abd Acute W/chest  01/07/2015   CLINICAL DATA:  Diarrhea for 2 days, weakness.  EXAM: ACUTE ABDOMEN SERIES (ABDOMEN 2 VIEW & CHEST 1 VIEW)  COMPARISON:  02/16/2013  FINDINGS: Moderate-sized hiatal hernia. Heart is normal size. Minimal left base atelectasis. Right lung is clear. No effusions.  Nonobstructive bowel gas pattern. No free air. No organomegaly or suspicious calcification. No acute bony abnormality.  IMPRESSION: No acute findings.  Moderate-sized hiatal hernia.  No active cardiopulmonary disease.   Electronically Signed   By: Charlett Nose M.D.   On: 01/07/2015 14:53     EKG Interpretation   Date/Time:  Saturday January 08 2015 13:23:59 EST Ventricular Rate:  108 PR Interval:  128 QRS Duration: 74 QT Interval:  326 QTC Calculation: 436 R Axis:   44 Text Interpretation:  Sinus tachycardia Cannot rule out Inferior infarct ,  age undetermined Abnormal ECG nonspecific T waves are unchanged from  Jan 07 2015 Confirmed by Criss Alvine  MD, SCOTT [phone]) on 01/08/2015 5:36:37 PM      Patient d/w with Dr. Arlyce Dice who will see the patient in consultation from GI.   CRITICAL CARE Performed by: Francee Piccolo L   Total critical care time: 45 minutes  Critical care time was exclusive of separately billable procedures and treating other patients.  Critical care was necessary to treat or prevent imminent or life-threatening deterioration.  Critical care was time spent personally by me on the following activities: development of treatment plan with patient and/or surrogate as well as nursing, discussions with consultants, evaluation of patient's response to treatment, examination of patient, obtaining history from patient or surrogate, ordering and performing treatments and interventions, ordering and review of laboratory studies, ordering and review of radiographic studies, pulse oximetry and re-evaluation of patient's condition.  MDM   Final diagnoses:  Upper gastrointestinal bleed    Filed Vitals:   01/09/15 0004  BP: 88/41  Pulse: 89  Temp: 98 F (36.7 C)  Resp: 27   I have reviewed nursing notes, vital signs, and all appropriate lab and imaging results for this patient.  Patient with GI bleeding. Significant hemoglobin drop from yesterday with hemoccult positive stool. GI consulted. Patient will be admitted to the stepdown unit to [facility] for further management and evaluation. Patient d/w with Dr. Criss Alvine, agrees with plan.     Jeannetta Ellis, PA-C 01/09/15 0037  Audree Camel, MD 01/09/15 [phone]

## 2015-01-08 NOTE — ED Notes (Addendum)
Pt family reports that he has had some n/v and weakness in the left arm for the past couple of days. Pt was lying in vomit this afternoon when staff checked on him and it was brown in color

## 2015-01-08 NOTE — ED Notes (Signed)
Attempted report 

## 2015-01-08 NOTE — H&P (Signed)
[facility] History and Physical  [name] VWU:[id number] DOB: October 15, 1956 DOA: 01/08/2015   Referring physician: ED physician PCP: No PCP Per Patient  Specialists:   Chief Complaint: Nausea, diarrhea, black stool, black coffee ground appearing emesis   HPI: [name] is a 59 y.o. male with past medical history significant for schizophrenia, mental retardation, seizure disorder, anemia, GERD, gastroparesis, Barrett's esophagus, history of GI bleeding, who presents with nausea, diarrhea, black stool, black coffee ground appearing emesis.  Patient is from group home and is accompanied by Production designer, theatre/television/film. Per manager (Ms. Shon Baton), in the past 3 days, patient has nausea, vomiting with black coffee ground appearing emesis. He was also noticed to have black stool. Patient does not have fever or chills. It is not sure whether patient has any abdominal pain or chest pain, since patient says "no" to all questions when asked whether he has any pain. Patient seems to not have headaches, cough, chest pain, SOB, dysuria, urgency, frequency, hematuria, skin rashes or leg swelling. 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 After that surgery, he developed left arm weakness and rigidity. Recently, his left arm seems be more sluggish per Production designer, theatre/television/film.  Work up in the ED demonstrates positive FOBT. Hemoglobin decreased from a previous 12.7 on 01/07/15 to 9.0, and then further decreased to 7.3, negative troponin, lactate 1.75, negative lipase. Patient is tachycardia. Patient is admitted to inpatient for further evaluation and treatment. GI was consulted by ED.  Review of Systems: As presented in the history of presenting illness, rest negative.  Where does patient live?  At Group home Can patient participate in ADLs? none  Allergy:  Allergies  Allergen Reactions  . Vancomycin     Past Medical History  Diagnosis Date  . Schizophrenia   . Mental  retardation   . Seizure   . Barrett esophagus   . Dysphagia   . Iron deficiency anemia   . Hypercholesteremia   . GERD (gastroesophageal reflux disease)   . Gastroparesis     Past Surgical History  Procedure Laterality Date  . Craniotomy for cyst fenestration      Social History:  reports that he has never smoked. He has never used smokeless tobacco. He reports that he does not drink alcohol or use illicit drugs.  Family History: unable to get due to dementia  Prior to Admission medications   Medication Sig Start Date End Date Taking? Authorizing Provider  divalproex (DEPAKOTE) 500 MG DR tablet Take 500 mg by mouth daily.   Yes Historical Provider, MD  ferrous sulfate 325 (65 FE) MG tablet Take 325 mg by mouth daily with breakfast.   Yes Historical Provider, MD  lamoTRIgine (LAMICTAL) 100 MG tablet Take 100 mg by mouth daily.   Yes Historical Provider, MD  levothyroxine (SYNTHROID, LEVOTHROID) 75 MCG tablet Take 75 mcg by mouth daily.   Yes Historical Provider, MD  lovastatin (MEVACOR) 20 MG tablet Take 20 mg by mouth at bedtime.   Yes Historical Provider, MD  omeprazole (PRILOSEC) 20 MG capsule Take 20 mg by mouth daily.   Yes Historical Provider, MD  propranolol (INDERAL) 10 MG tablet Take 10 mg by mouth 3 (three) times daily.   Yes Historical Provider, MD  QUEtiapine (SEROQUEL) 400 MG tablet Take 400 mg by mouth at bedtime.   Yes Historical Provider, MD  Vitamin D, Ergocalciferol, (DRISDOL) 50000 UNITS CAPS Take 50,000 Units by mouth every 30 (thirty) days.   Yes Historical Provider,  MD  fenofibrate 54 MG tablet Take 54 mg by mouth daily.    Historical Provider, MD    Physical Exam: Filed Vitals:   01/08/15 1745 01/08/15 1800 01/08/15 1815 01/08/15 1830  BP: 123/71 105/52 97/58 93/65   Pulse: 97 98 97 98  Temp:      TempSrc:      Resp: 35 35 26 28  Height:      Weight:      SpO2: 100% 97% 100% 99%   General: Not in acute distress HEENT:       Eyes: PERRL, EOMI, no  scleral icterus       ENT: No discharge from the ears and nose, no pharynx injection, no tonsillar enlargement.        Neck: No JVD, no bruit, no mass felt. Cardiac: S1/S2, RRR, No murmurs, No gallops or rubs Pulm: Good air movement bilaterally. Clear to auscultation bilaterally. No rales, wheezing, rhonchi or rubs. Abd: Soft, nondistended, nontender, no rebound pain, no organomegaly, BS present Ext: No edema bilaterally. 2+DP/PT pulse bilaterally Musculoskeletal: No joint deformities, erythema, or stiffness, ROM full Skin: No rashes.  Neuro: Alert and not oriented X3, cranial nerves II-XII grossly intact, left hand and wrist contracted, with rigidity.  Brachial reflex 1+ bilaterally. Knee reflex 1+ bilaterally. Negative Babinski's sign.  Psych: very anxious.   Labs on Admission:  Basic Metabolic Panel:  Recent Labs Lab 01/07/15 1359 01/08/15 1428  NA 138 141  K 4.3 4.0  CL 109 105  CO2 21 31  GLUCOSE 130* 129*  BUN 50* 31*  CREATININE 1.04 1.21  CALCIUM 7.3* 8.5   Liver Function Tests:  Recent Labs Lab 01/08/15 1428  AST 21  ALT 19  ALKPHOS 44  BILITOT 0.5  PROT 5.7*  ALBUMIN 3.2*    Recent Labs Lab 01/08/15 1730  LIPASE 22   No results for input(s): AMMONIA in the last 168 hours. CBC:  Recent Labs Lab 01/07/15 1315 01/08/15 1428  WBC 8.1 6.2  NEUTROABS 6.1 4.0  HGB 12.7* 9.0*  HCT 38.8* 26.5*  MCV 92.4 88.9  PLT 196 184   Cardiac Enzymes:  Recent Labs Lab 01/07/15 1359 01/08/15 1730  TROPONINI <0.03 <0.03    BNP (last 3 results) No results for input(s): PROBNP in the last 8760 hours. CBG: No results for input(s): GLUCAP in the last 168 hours.  Radiological Exams on Admission: Dg Chest Portable 1 View  01/08/2015   CLINICAL DATA:  Nausea, vomiting, weakness, diarrhea for a few days.  EXAM: PORTABLE CHEST - 1 VIEW  COMPARISON:  01/07/2015  FINDINGS: The heart size and mediastinal contours are within normal limits. Both lungs are clear. The  visualized skeletal structures are unremarkable.  IMPRESSION: No active disease.   Electronically Signed   By: Charlett Nose M.D.   On: 01/08/2015 18:55   Dg Abd Acute W/chest  01/07/2015   CLINICAL DATA:  Diarrhea for 2 days, weakness.  EXAM: ACUTE ABDOMEN SERIES (ABDOMEN 2 VIEW & CHEST 1 VIEW)  COMPARISON:  02/16/2013  FINDINGS: Moderate-sized hiatal hernia. Heart is normal size. Minimal left base atelectasis. Right lung is clear. No effusions.  Nonobstructive bowel gas pattern. No free air. No organomegaly or suspicious calcification. No acute bony abnormality.  IMPRESSION: No acute findings.  Moderate-sized hiatal hernia.  No active cardiopulmonary disease.   Electronically Signed   By: Charlett Nose M.D.   On: 01/07/2015 14:53    EKG: Independently reviewed.   Assessment/Plan Principal Problem:   GIB (  gastrointestinal bleeding) Active Problems:   Hypothyroidism   Schizophrenia, unspecified type   Depression   Essential hypertension   GERD   Gastroparesis   History of diverticulosis   Seizure   Nausea & vomiting  GIB and nausea, vomiting and diarrhea: unclear etiology. Patient is not on blood thinner. EGD on 2010 showed barrett's esophagus and hiatal hernia. He had a normal colonoscopy on 2009. Currently patient has nausea, vomiting with coffee ground emesis. It is likely that he has gastroenteritis which may have partially contributed to GI bleeding. GI is consulted by ED. Since his hge continued to drop to 7.3 and patient developed tachycardia, will transfuse patient and pending GI consult.  - will admit to SDU  - GI consulted by Ed, will follow up recommendations  - NPO  - transfuse 2 units of blood  - NS at 125 mL/hr  - Start IV pantoprazole 40 mg bib  - Zofran IV for nausea  - Avoid NSAIDs and SQ heparin  - Monitor closely and follow q6h cbc, transfuse as necessary.  - LaB: INR, PTT, Lactate - check c diff, GI pathogen panel, UDS, HIV ab  Hypothyroidism: No recent TSH since  2010 when TSH was 1.0. he is on Synthroid at home. -Continue Synthroid - check TSH.  Seizure: Patient is on Depakote and Lamictal -Continue Depakote and Lamictal - check Depakote level  Depression:  -continue Seroquel  Hyperlipidemia: Patient is on Lovastatin at home - pravastatin the hospital.   DVT ppx: SCD  Code Status: Full code Family Communication:   Yes, patient's   Group home manager    at bed side Disposition Plan: Admit to inpatient   Date of Service 01/08/2015    Lorretta HarpIU, Santita Hunsberger [facility] Pager [phone]  If 7PM-7AM, please contact night-coverage [email or web address] Password [email or web address] 01/08/2015, 7:18 PM

## 2015-01-08 NOTE — ED Notes (Signed)
I Stat Lactic Acid results shown to Francee PiccoloJennifer Piepenbrink, PA

## 2015-01-09 ENCOUNTER — Encounter (HOSPITAL_COMMUNITY)
Admission: EM | Disposition: A | Discharge status: Home Health | Payer: Self-pay | Source: Home / Self Care | Attending: Internal Medicine

## 2015-01-09 ENCOUNTER — Encounter (HOSPITAL_COMMUNITY): Payer: Self-pay

## 2015-01-09 DIAGNOSIS — K921 Melena: Secondary | ICD-10-CM

## 2015-01-09 DIAGNOSIS — K209 Esophagitis, unspecified without bleeding: Secondary | ICD-10-CM

## 2015-01-09 DIAGNOSIS — R197 Diarrhea, unspecified: Secondary | ICD-10-CM | POA: Insufficient documentation

## 2015-01-09 DIAGNOSIS — R112 Nausea with vomiting, unspecified: Secondary | ICD-10-CM | POA: Insufficient documentation

## 2015-01-09 DIAGNOSIS — F209 Schizophrenia, unspecified: Secondary | ICD-10-CM

## 2015-01-09 DIAGNOSIS — E038 Other specified hypothyroidism: Secondary | ICD-10-CM

## 2015-01-09 DIAGNOSIS — K922 Gastrointestinal hemorrhage, unspecified: Secondary | ICD-10-CM | POA: Insufficient documentation

## 2015-01-09 DIAGNOSIS — K2901 Acute gastritis with bleeding: Secondary | ICD-10-CM

## 2015-01-09 HISTORY — DX: Esophagitis, unspecified without bleeding: K20.90

## 2015-01-09 HISTORY — PX: ESOPHAGOGASTRODUODENOSCOPY: SHX5428

## 2015-01-09 LAB — COMPREHENSIVE METABOLIC PANEL
ALT: 14 U/L (ref 0–53)
ANION GAP: 9 (ref 5–15)
AST: 18 U/L (ref 0–37)
Albumin: 2.7 g/dL — ABNORMAL LOW (ref 3.5–5.2)
Alkaline Phosphatase: 42 U/L (ref 39–117)
BUN: 14 mg/dL (ref 6–23)
CHLORIDE: 109 meq/L (ref 96–112)
CO2: 23 mmol/L (ref 19–32)
Calcium: 7.4 mg/dL — ABNORMAL LOW (ref 8.4–10.5)
Creatinine, Ser: 1.12 mg/dL (ref 0.50–1.35)
GFR calc non Af Amer: 71 mL/min — ABNORMAL LOW (ref >90)
GFR, EST AFRICAN AMERICAN: 82 mL/min — AB (ref >90)
GLUCOSE: 94 mg/dL (ref 70–99)
Potassium: 3.8 mmol/L (ref 3.5–5.1)
Sodium: 141 mmol/L (ref 135–145)
Total Bilirubin: 0.6 mg/dL (ref 0.3–1.2)
Total Protein: 5.1 g/dL — ABNORMAL LOW (ref 6.0–8.3)

## 2015-01-09 LAB — CBC
HCT: 29.4 % — ABNORMAL LOW (ref 39.0–52.0)
HEMATOCRIT: 29.7 % — AB (ref 39.0–52.0)
Hemoglobin: 10.1 g/dL — ABNORMAL LOW (ref 13.0–17.0)
Hemoglobin: 10 g/dL — ABNORMAL LOW (ref 13.0–17.0)
MCH: 29.9 pg (ref 26.0–34.0)
MCH: 29.9 pg (ref 26.0–34.0)
MCHC: 34 g/dL (ref 30.0–36.0)
MCHC: 34 g/dL (ref 30.0–36.0)
MCV: 87.9 fL (ref 78.0–100.0)
MCV: 88 fL (ref 78.0–100.0)
Platelets: 143 10*3/uL — ABNORMAL LOW (ref 150–400)
Platelets: 152 10*3/uL (ref 150–400)
RBC: 3.38 MIL/uL — AB (ref 4.22–5.81)
RBC: 3.34 MIL/uL — ABNORMAL LOW (ref 4.22–5.81)
RDW: 13.6 % (ref 11.5–15.5)
RDW: 13.8 % (ref 11.5–15.5)
WBC: 4.8 10*3/uL (ref 4.0–10.5)
WBC: 5.8 10*3/uL (ref 4.0–10.5)

## 2015-01-09 LAB — TROPONIN I: Troponin I: <0.03 ng/mL (ref <0.031)

## 2015-01-09 LAB — GLUCOSE, CAPILLARY: Glucose-Capillary: 88 mg/dL (ref 70–99)

## 2015-01-09 LAB — VALPROIC ACID LEVEL
VALPROIC ACID LVL: 29 ug/mL — AB (ref 50.0–100.0)
VALPROIC ACID LVL: 52.4 ug/mL (ref 50.0–100.0)

## 2015-01-09 LAB — PREPARE RBC (CROSSMATCH)

## 2015-01-09 LAB — TSH: TSH: 4.093 u[IU]/mL (ref 0.350–4.500)

## 2015-01-09 SURGERY — EGD (ESOPHAGOGASTRODUODENOSCOPY)
Anesthesia: Moderate Sedation

## 2015-01-09 MED ORDER — ALPRAZOLAM 0.5 MG PO TABS: 0.5000 mg | ORAL_TABLET | Freq: Once | ORAL | Status: DC

## 2015-01-09 MED ORDER — FENTANYL CITRATE 0.05 MG/ML IJ SOLN
INTRAMUSCULAR | Status: AC
  Filled 2015-01-09: qty 2

## 2015-01-09 MED ORDER — PRAVASTATIN SODIUM 20 MG PO TABS
20.0000 mg | ORAL_TABLET | Freq: Every day | ORAL | Status: DC
  Administered 2015-01-09 – 2015-01-10 (×2): 20 mg via ORAL
  Filled 2015-01-09 (×3): qty 1

## 2015-01-09 MED ORDER — MIDAZOLAM HCL 5 MG/ML IJ SOLN
INTRAMUSCULAR | Status: AC
  Filled 2015-01-09: qty 2

## 2015-01-09 MED ORDER — SODIUM CHLORIDE 0.9 % IV BOLUS (SEPSIS): 1000.0000 mL | Freq: Once | INTRAVENOUS | Status: DC

## 2015-01-09 MED ORDER — PANTOPRAZOLE SODIUM 40 MG PO TBEC
40.0000 mg | DELAYED_RELEASE_TABLET | Freq: Two times a day (BID) | ORAL | Status: DC
  Administered 2015-01-09 – 2015-01-11 (×3): 40 mg via ORAL
  Filled 2015-01-09 (×4): qty 1

## 2015-01-09 MED ORDER — DIPHENHYDRAMINE HCL 50 MG/ML IJ SOLN
INTRAMUSCULAR | Status: AC
  Filled 2015-01-09: qty 1

## 2015-01-09 MED ORDER — FENTANYL CITRATE 0.05 MG/ML IJ SOLN
INTRAMUSCULAR | Status: DC | PRN
  Administered 2015-01-09: 25 ug via INTRAVENOUS

## 2015-01-09 MED ORDER — MIDAZOLAM HCL 5 MG/5ML IJ SOLN
INTRAMUSCULAR | Status: DC | PRN
  Administered 2015-01-09: 2 mg via INTRAVENOUS

## 2015-01-09 MED ORDER — SODIUM CHLORIDE 0.9 % IV SOLN: INTRAVENOUS | Status: DC

## 2015-01-09 MED ORDER — SODIUM CHLORIDE 0.9 % IV SOLN
Freq: Once | INTRAVENOUS | Status: AC
  Administered 2015-01-09: 250 mL via INTRAVENOUS

## 2015-01-09 MED ORDER — SODIUM CHLORIDE 0.9 % IV BOLUS (SEPSIS)
500.0000 mL | Freq: Once | INTRAVENOUS | Status: AC
  Administered 2015-01-09: 500 mL via INTRAVENOUS

## 2015-01-09 NOTE — Op Note (Signed)
[facility] [address] [place], [id number]   ENDOSCOPY PROCEDURE REPORT  PATIENT: [name], [name]  MR#: [id number] BIRTHDATE: 03/13/1956 , 58  yrs. old GENDER: male ENDOSCOPIST: Louis Meckelobert D Dominique Ressel, MD REFERRED BY: PROCEDURE DATE:  01/09/2015 PROCEDURE:  EGD w/ biopsy ASA CLASS:     Class II INDICATIONS:  melena. MEDICATIONS: Versed 3 mg IV and Fentanyl 25 mcg IV TOPICAL ANESTHETIC: Cetacaine Spray  DESCRIPTION OF PROCEDURE: After the risks benefits and alternatives of the procedure were thoroughly explained, informed consent was obtained.  The Pentax Gastroscope [id number] endoscope was introduced through the mouth and advanced to the second portion of the duodenum , Without limitations.  The instrument was slowly withdrawn as the mucosa was fully examined.    ESOPHAGUS: There was LA Class B esophagitis (One or more mucosal breaks > 5mm, but without continuity across mucosal folds) noted. Z line was at 35 cm in GE junction was irregular, consistent with established Barrett's esophagus.  Biopsies were taken. STOMACH: A 7 cm hiatal hernia was noted.  cm hiatal hernia was noted.   Except for the findings listed the EGD was otherwise normal.  Retroflexed views revealed no abnormalities.    There was no fresh or old blood in the upper GI tract The scope was then withdrawn from the patient and the procedure completed.  COMPLICATIONS: There were no immediate complications.  ENDOSCOPIC IMPRESSION: 1.  erosive esophagitis 2.  Barrett's esophagus  GI bleeding is presumably due to esophagitis although there is no stigmata of bleeding.  Elevated BUN suggests a proximal GI bleeding source.  RECOMMENDATIONS: 1.  continue twice a day PPI therapy 2.  if patient has recurrent bleeding would consider enteroscopy 3.  Await biopsy findings  REPEAT EXAM:  eSigned:  Louis Meckelobert D Markail Diekman, MD 01/09/2015 11:17 AM    CC:  PATIENT NAME:  [name], Omarii  P MR#: [id number]

## 2015-01-09 NOTE — Progress Notes (Signed)
Dr Clyde LundborgNiu paged for B/P 88/41 and hgb 7.3. Had called lab to draw H/H at 22:30. At that time the onlly H/H was the one from 14:28 I told him they were every 6 hours At around 2305 I saw a tech in drawing bloodwork and assumed it was the H/H.to see if H/H. When I checked at 2330 there was only the Troponin result. I then recalled lab and they told me an H/H was not drawn. Dr. Youlanda RoysNui made aware. Pt asymptomatic other than his B/P Fluid bolus hung .

## 2015-01-09 NOTE — Progress Notes (Signed)
EGD demonstrated grade B erosive esophagitis but there was no stigmata of recent hemorrhage.  Given the severity of his anemia findings at endoscopy are relatively unimpressive.  Elevated BUN suggests a proximal GI bleeding source  Should he develop recurrent bleeding would consider enteroscopy

## 2015-01-09 NOTE — Progress Notes (Signed)
Pt just returned to floor from Endo. Pt's sister at bedside. Pt is resting and vitals within normal limits at this time. Will monitor.

## 2015-01-09 NOTE — Consult Note (Signed)
Consultation  Referring Provider: [facility] Primary Care Physician:  No PCP Per Patient Primary Gastroenterologist:  Dr.Stark  Reason for Consultation: coffee ground emesis, melena , anemia  HPI: [name] is a 59 y.o. male known to the GI service but not seen in the past few years. He was admitted last night with 3 day hx of nausea/vomiting and coffee ground emesis. Apparently had been having black stools as well. He has a hx of Gi bleeding and recurrent episodes of esophagitis. He is schizophrenic, has mild mental retardation Barretts esophagus, and seizure disorder. Also with gastroparesis. Last EGD was done in 2010  With finding of Barretts. EGD in 2009 with moderate acute esophagitis. He stays on fe daily as well as Prilosec. Denies any regular ASA or Nsaids  HGb was 12.7 on admit and quickly dropped to 7.3 with hydration. He has had 2 units of Prbc's - Hgb  Pending post transfusion.   Past Medical History  Diagnosis Date  . Schizophrenia   . Mental retardation   . Seizure   . Barrett esophagus   . Dysphagia   . Iron deficiency anemia   . Hypercholesteremia   . GERD (gastroesophageal reflux disease)   . Gastroparesis     Past Surgical History  Procedure Laterality Date  . Craniotomy for cyst fenestration      Prior to Admission medications   Medication Sig Start Date End Date Taking? Authorizing Provider  divalproex (DEPAKOTE) 500 MG DR tablet Take 500 mg by mouth daily.   Yes Historical Provider, MD  ferrous sulfate 325 (65 FE) MG tablet Take 325 mg by mouth daily with breakfast.   Yes Historical Provider, MD  lamoTRIgine (LAMICTAL) 100 MG tablet Take 100 mg by mouth daily.   Yes Historical Provider, MD  levothyroxine (SYNTHROID, LEVOTHROID) 75 MCG tablet Take 75 mcg by mouth daily.   Yes Historical Provider, MD  lovastatin (MEVACOR) 20 MG tablet Take 20 mg by mouth at bedtime.   Yes Historical Provider, MD  omeprazole (PRILOSEC) 20 MG capsule  Take 20 mg by mouth daily.   Yes Historical Provider, MD  propranolol (INDERAL) 10 MG tablet Take 10 mg by mouth 3 (three) times daily.   Yes Historical Provider, MD  QUEtiapine (SEROQUEL) 400 MG tablet Take 400 mg by mouth at bedtime.   Yes Historical Provider, MD  Vitamin D, Ergocalciferol, (DRISDOL) 50000 UNITS CAPS Take 50,000 Units by mouth every 30 (thirty) days.   Yes Historical Provider, MD    Current Facility-Administered Medications  Medication Dose Route Frequency Provider Last Rate Last Dose  . 0.9 %  sodium chloride infusion  1,000 mL Intravenous Continuous Jennifer L Piepenbrink, PA-C      . acetaminophen (TYLENOL) tablet 650 mg  650 mg Oral Q6H PRN Lorretta Harp, MD       Or  . acetaminophen (TYLENOL) suppository 650 mg  650 mg Rectal Q6H PRN Lorretta Harp, MD      . ALPRAZolam Prudy Feeler) tablet 0.5 mg  0.5 mg Oral Once Lorretta Harp, MD   0.5 mg at 01/09/15 0703  . divalproex (DEPAKOTE) DR tablet 500 mg  500 mg Oral Daily Lorretta Harp, MD   500 mg at 01/08/15 2156  . ferrous sulfate tablet 325 mg  325 mg Oral Q breakfast Lorretta Harp, MD   325 mg at 01/09/15 0848  . lamoTRIgine (LAMICTAL) tablet 100 mg  100 mg Oral Daily Lorretta Harp, MD      . levothyroxine (SYNTHROID, LEVOTHROID)  tablet 75 mcg  75 mcg Oral QAC breakfast Lorretta HarpXilin Niu, MD   75 mcg at 01/09/15 0849  . ondansetron (ZOFRAN) injection 4 mg  4 mg Intravenous Q8H PRN Lorretta HarpXilin Niu, MD      . pantoprazole (PROTONIX) injection 40 mg  40 mg Intravenous Q12H Lorretta HarpXilin Niu, MD   40 mg at 01/08/15 2205  . pravastatin (PRAVACHOL) tablet 20 mg  20 mg Oral q1800 Lorretta HarpXilin Niu, MD      . propranolol (INDERAL) tablet 10 mg  10 mg Oral TID Lorretta HarpXilin Niu, MD   10 mg at 01/08/15 2159  . QUEtiapine (SEROQUEL) tablet 400 mg  400 mg Oral QHS Lorretta HarpXilin Niu, MD   400 mg at 01/08/15 2230  . sodium chloride 0.9 % injection 3 mL  3 mL Intravenous Q12H Lorretta HarpXilin Niu, MD        Allergies as of 01/08/2015 - Review Complete 01/08/2015  Allergen Reaction Noted  . Vancomycin      History  reviewed. No pertinent family history.  History   Social History  . Marital Status: Single    Spouse Name: N/A    Number of Children: N/A  . Years of Education: N/A   Occupational History  . Not on file.   Social History Main Topics  . Smoking status: Never Smoker   . Smokeless tobacco: Never Used  . Alcohol Use: No  . Drug Use: No  . Sexual Activity: No   Other Topics Concern  . Not on file   Social History Narrative    Review of Systems:  Pertinent positive and negative review of systems were noted in the above HPI section.  All other review of systems was otherwise negative.  Physical Exam: Vital signs in last 24 hours: Temp:  [98 F (36.7 C)-99.6 F (37.6 C)] 98.9 F (37.2 C) (01/17 0806) Pulse Rate:  [84-113] 87 (01/17 0806) Resp:  [18-46] 31 (01/17 0806) BP: (84-139)/(41-125) 87/49 mmHg (01/17 0806) SpO2:  [91 %-100 %] 93 % (01/17 0806) FiO2 (%):  [28 %] 28 % (01/16 1708) Weight:  [136 lb (61.689 kg)-153 lb 3.5 oz (69.5 kg)] 153 lb 3.5 oz (69.5 kg) (01/16 2053)   General:   Alert,  Well-developed, well-nourished,pale AA  pleasant and cooperative in NAD Head:  Normocephalic and atraumatic. Eyes:  Sclera clear, no icterus.   Conjunctiva pale Ears:  Normal auditory acuity. Nose:  No deformity, discharge,  or lesions. Mouth:  No deformity or lesions.   Neck:  Supple; no masses or thyromegaly. Lungs:  Clear throughout to auscultation.   No wheezes, crackles, or rhonchi. Heart:  Regular rate and rhythm; no murmurs, clicks, rubs,  or gallops. Abdomen:  Soft,nontender, BS active,nonpalp mass or hsm.   Rectal:  Deferred  Msk:  Symmetrical without gross deformities. . Pulses:  Normal pulses noted. Extremities:  Without clubbing or edema. Neurologic:  Alert and  oriented x4;  grossly normal neurologically. Skin:  Intact without significant lesions or rashes.. Psych:  Alert and cooperative. Normal mood and affect.  Intake/Output from previous day: 01/16 0701 -  01/17 0700 In: 1745.4 [I.V.:1193.8; Blood:551.6] Out: 525 [Urine:525] Intake/Output this shift: Total I/O In: 62.5 [Blood:62.5] Out: -   Lab Results:  Recent Labs  01/07/15 1315 01/08/15 1428 01/08/15 1947  WBC 8.1 6.2 5.4  HGB 12.7* 9.0* 7.3*  HCT 38.8* 26.5* 21.9*  PLT 196 184 158   BMET  Recent Labs  01/07/15 1359 01/08/15 1428  NA 138 141  K 4.3 4.0  CL 109 105  CO2 21 31  GLUCOSE 130* 129*  BUN 50* 31*  CREATININE 1.04 1.21  CALCIUM 7.3* 8.5   LFT  Recent Labs  01/08/15 1428  PROT 5.7*  ALBUMIN 3.2*  AST 21  ALT 19  ALKPHOS 44  BILITOT 0.5   PT/INR  Recent Labs  01/08/15 1730  LABPROT 14.1  INR 1.07    IMPRESSION:  #1  59 yo male   with an acute upper  GI bleed likely secondary to recurrent esophagitis. R/O PUD , mallory weiss tear etc. #2 Schizophrenia #3 Mild MR #4 seizure disorder #5 Barretts esophagus  #6 hx HTN #7 hx iron deficiency anemia  PLAN: #1  Transfuse to keep hgb 8 #2 IV PPI BID as doing #3 EGD today with Dr. Arlyce Dice - further plans pending findings    Amy Esterwood  01/09/2015, 8:58 AM   GI Attending Note   Chart was reviewed and patient was examined. X-rays and lab were reviewed.    I agree with management and plans. Recurrent GI bleeding most likely related active PUD/esophagitis or MW tear.  Plan to proceed with EGD/  Barbette Hair. Arlyce Dice, M.D., [facility] Cell [phone]

## 2015-01-09 NOTE — Progress Notes (Signed)
PT Cancellation Note  Patient Details Name: [name] MRN: [id number] DOB: 1956-06-18   Cancelled Treatment:    Reason Eval/Treat Not Completed: Other (comment) (pt on strict bedrest.  Hgb also low 7.3.).  PT will check back tomorrow.  Listed attending is no longer on duty, so no one paged about the bedrest orders.  Left a note in the physician sticky notes.  Thanks,    Rollene Rotundaebecca B. Ermal Brzozowski, PT, DPT [phone]   01/09/2015, 11:13 AM

## 2015-01-09 NOTE — Progress Notes (Signed)
UR Completed.  [phone]  

## 2015-01-09 NOTE — Progress Notes (Signed)
[facility] TEAM 1 - Stepdown/ICU TEAM Progress Note  FERRON ISHMAEL ZOX:[id number] DOB: 10/18/1956 DOA: 01/08/2015 PCP: No PCP Per Patient  Admit HPI / Brief Narrative: [name] is a 59 y.o. BM who lives in a group home PMHx schizophrenia, mental retardation, seizure disorder, anemia, GERD, gastroparesis, Barrett's esophagus, history of GI bleeding, who presents with nausea, diarrhea, black stool, black coffee ground appearing emesis.  Patient is from group home and is accompanied by Production designer, theatre/television/film. Per manager (Ms. Shon Baton), in the past 3 days, patient has nausea, vomiting with black coffee ground appearing emesis. He was also noticed to have black stool. Patient does not have fever or chills. It is not sure whether patient has any abdominal pain or chest pain, since patient says "no" to all questions when asked whether he has any pain. Patient seems to not have headaches, cough, chest pain, SOB, dysuria, urgency, frequency, hematuria, skin rashes or leg swelling. 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 After that surgery, he developed left arm weakness and rigidity. Recently, his left arm seems be more sluggish per Production designer, theatre/television/film.  Work up in the ED demonstrates positive FOBT. Hemoglobin decreased from a previous 12.7 on 01/07/15 to 9.0, and then further decreased to 7.3, negative troponin, lactate 1.75, negative lipase. Patient is tachycardia. Patient is admitted to inpatient for further evaluation and treatment. GI was consulted by ED.  Review of Systems: As presented in the history of presenting illness, rest negative.   HPI/Subjective: 1/17 alert, states negative N/V/abdominal pain, however caregiver states that he will answer but he understands and not having pain to all questions  Assessment/Plan: GIB and nausea, vomiting and diarrhea:  -Patient is not on blood thinner. EGD on 2010 showed barrett's esophagus and hiatal hernia. He had a  normal colonoscopy on 2009.  -Since his hge continued to drop to 7.3 and patient developed tachycardia, transfused 2 units PRBC  - continue NS 75 mL/hr  - continue pantoprazole PO 40 mg BID  - Zofran IV for nausea  - Avoid NSAIDs and SQ heparin  - Monitor closely and follow q6h cbc, transfuse as necessary.  - UDS pending   Hypothyroidism:  -No recent TSH since 2010 when TSH was 1.0. he is on Synthroid at home. -Continue Synthroid 75 g daily - TSH= 4.0.  Seizure: Patient is on Depakote and Lamictal -Continue Lamictal 100 mg daily  -Continue Depakote 500 mg daily  - Depakote level pending  Schizophrenia -Stable  Depression:  -continue Seroquel 400 mgQHS  Hyperlipidemia:  Patient is on Lovastatin at home - pravastatin the hospital.    Code Status: FULL Family Communication: Spoke with [name] (sister) over phone [phone]  Disposition Plan: Discharge in a.m. if hemoglobin stable    Consultants: Dr. Melvia Heaps (GI)    Procedure/Significant Events: 1/17 EGD;erosive esophagitis, Barrett's esophagus   Culture 1/16 C. Difficile pending 1/16 GI pathogen panel pending 1/17 HIV pending 1/17Gastric biopsies pending  Antibiotics: NA  DVT prophylaxis: SCD   Devices NA   LINES / TUBES:  NA    Continuous Infusions: . sodium chloride      Objective: VITAL SIGNS: Temp: 99.3 F (37.4 C) (01/17 1200) Temp Source: Oral (01/17 1200) BP: 108/59 mmHg (01/17 1200) Pulse Rate: 106 (01/17 1200) SPO2; FIO2:   Intake/Output Summary (Last 24 hours) at 01/09/15 1353 Last data filed at 01/09/15 1218  Gross per 24 hour  Intake 1810.87 ml  Output    525  ml  Net 1285.87 ml     Exam: General: Alert, follows all commands, No acute respiratory distress Lungs: Clear to auscultation bilaterally without wheezes or crackles Cardiovascular: Regular rate and rhythm without murmur gallop or rub normal S1 and S2 Abdomen: Nontender, nondistended, soft,  bowel sounds positive, no rebound, no ascites, no appreciable mass Extremities: No significant cyanosis, clubbing, or edema bilateral lower extremities  Data Reviewed: Basic Metabolic Panel:  Recent Labs Lab 01/07/15 1359 01/08/15 1428 01/09/15 1247  NA 138 141 141  K 4.3 4.0 3.8  CL 109 105 109  CO2 21 31 23   GLUCOSE 130* 129* 94  BUN 50* 31* 14  CREATININE 1.04 1.21 1.12  CALCIUM 7.3* 8.5 7.4*   Liver Function Tests:  Recent Labs Lab 01/08/15 1428 01/09/15 1247  AST 21 18  ALT 19 14  ALKPHOS 44 42  BILITOT 0.5 0.6  PROT 5.7* 5.1*  ALBUMIN 3.2* 2.7*    Recent Labs Lab 01/08/15 1730  LIPASE 22   No results for input(s): AMMONIA in the last 168 hours. CBC:  Recent Labs Lab 01/07/15 1315 01/08/15 1428 01/08/15 1947 01/09/15 1247  WBC 8.1 6.2 5.4 4.8  NEUTROABS 6.1 4.0  --   --   HGB 12.7* 9.0* 7.3* 10.1*  HCT 38.8* 26.5* 21.9* 29.7*  MCV 92.4 88.9 89.0 87.9  PLT 196 184 158 143*   Cardiac Enzymes:  Recent Labs Lab 01/07/15 1359 01/08/15 1730 01/08/15 2305 01/09/15 1247  TROPONINI <0.03 <0.03 <0.03 <0.03   BNP (last 3 results) No results for input(s): PROBNP in the last 8760 hours. CBG:  Recent Labs Lab 01/09/15 0805  GLUCAP 88    Recent Results (from the past 240 hour(s))  MRSA PCR Screening     Status: None   Collection Time: 01/08/15  9:15 PM  Result Value Ref Range Status   MRSA by PCR NEGATIVE NEGATIVE Final    Comment:        The GeneXpert MRSA Assay (FDA approved for NASAL specimens only), is one component of a comprehensive MRSA colonization surveillance program. It is not intended to diagnose MRSA infection nor to guide or monitor treatment for MRSA infections.      Studies:  Recent x-ray studies have been reviewed in detail by the Attending Physician  Scheduled Meds:  Scheduled Meds: . ALPRAZolam  0.5 mg Oral Once  . divalproex  500 mg Oral Daily  . ferrous sulfate  325 mg Oral Q breakfast  . lamoTRIgine   100 mg Oral Daily  . levothyroxine  75 mcg Oral QAC breakfast  . pantoprazole (PROTONIX) IV  40 mg Intravenous Q12H  . pravastatin  20 mg Oral q1800  . propranolol  10 mg Oral TID  . QUEtiapine  400 mg Oral QHS  . sodium chloride  3 mL Intravenous Q12H    Time spent on care of this patient: 40 mins   Drema DallasWOODS, CURTIS, J , MD   [facility] Office  [phone] Pager [phone]  On-Call/Text Page:      [email or web address]      password [email or web address]  If 7PM-7AM, please contact night-coverage [email or web address] Password [email or web address] 01/09/2015, 1:53 PM   LOS: 1 day

## 2015-01-10 ENCOUNTER — Inpatient Hospital Stay (HOSPITAL_COMMUNITY): Payer: Medicare Other

## 2015-01-10 ENCOUNTER — Encounter (HOSPITAL_COMMUNITY): Payer: Self-pay | Admitting: Gastroenterology

## 2015-01-10 DIAGNOSIS — F329 Major depressive disorder, single episode, unspecified: Secondary | ICD-10-CM

## 2015-01-10 LAB — URINE CULTURE
Colony Count: NO GROWTH
Culture: NO GROWTH

## 2015-01-10 LAB — CBG MONITORING, ED: Glucose-Capillary: 99 mg/dL (ref 70–99)

## 2015-01-10 LAB — CBC
HEMATOCRIT: 28.9 % — AB (ref 39.0–52.0)
HEMATOCRIT: 31.1 % — AB (ref 39.0–52.0)
HEMOGLOBIN: 9.7 g/dL — AB (ref 13.0–17.0)
Hemoglobin: 10.4 g/dL — ABNORMAL LOW (ref 13.0–17.0)
MCH: 30.3 pg (ref 26.0–34.0)
MCH: 29.5 pg (ref 26.0–34.0)
MCHC: 33.6 g/dL (ref 30.0–36.0)
MCHC: 33.4 g/dL (ref 30.0–36.0)
MCV: 90.3 fL (ref 78.0–100.0)
MCV: 88.4 fL (ref 78.0–100.0)
Platelets: 148 10*3/uL — ABNORMAL LOW (ref 150–400)
Platelets: 178 10*3/uL (ref 150–400)
RBC: 3.52 MIL/uL — ABNORMAL LOW (ref 4.22–5.81)
RBC: 3.2 MIL/uL — AB (ref 4.22–5.81)
RDW: 13.7 % (ref 11.5–15.5)
RDW: 13.9 % (ref 11.5–15.5)
WBC: 5.3 10*3/uL (ref 4.0–10.5)
WBC: 5.2 10*3/uL (ref 4.0–10.5)

## 2015-01-10 LAB — TYPE AND SCREEN
ABO/RH(D): O POS
ANTIBODY SCREEN: NEGATIVE
UNIT DIVISION: 0
Unit division: 0

## 2015-01-10 LAB — BASIC METABOLIC PANEL
Anion gap: 4 — ABNORMAL LOW (ref 5–15)
BUN: 8 mg/dL (ref 6–23)
CALCIUM: 7.6 mg/dL — AB (ref 8.4–10.5)
CO2: 28 mmol/L (ref 19–32)
Chloride: 108 mEq/L (ref 96–112)
Creatinine, Ser: 1.02 mg/dL (ref 0.50–1.35)
GFR calc non Af Amer: 80 mL/min — ABNORMAL LOW (ref >90)
Glucose, Bld: 104 mg/dL — ABNORMAL HIGH (ref 70–99)
Potassium: 3.6 mmol/L (ref 3.5–5.1)
Sodium: 140 mmol/L (ref 135–145)

## 2015-01-10 LAB — GLUCOSE, CAPILLARY: Glucose-Capillary: 118 mg/dL — ABNORMAL HIGH (ref 70–99)

## 2015-01-10 LAB — BRAIN NATRIURETIC PEPTIDE: B Natriuretic Peptide: 219.3 pg/mL — ABNORMAL HIGH (ref 0.0–100.0)

## 2015-01-10 MED ORDER — ALBUTEROL SULFATE (2.5 MG/3ML) 0.083% IN NEBU: 2.5000 mg | INHALATION_SOLUTION | Freq: Once | RESPIRATORY_TRACT | Status: AC

## 2015-01-10 MED ORDER — FUROSEMIDE 10 MG/ML IJ SOLN
20.0000 mg | Freq: Once | INTRAMUSCULAR | Status: AC
  Administered 2015-01-10: 20 mg via INTRAVENOUS
  Filled 2015-01-10: qty 2

## 2015-01-10 MED ORDER — SODIUM CHLORIDE 0.9 % IV SOLN
1000.0000 mL | INTRAVENOUS | Status: DC
  Administered 2015-01-10: 1000 mL via INTRAVENOUS

## 2015-01-10 MED ORDER — FERROUS SULFATE 325 (65 FE) MG PO TABS
325.0000 mg | ORAL_TABLET | Freq: Two times a day (BID) | ORAL | Status: DC
  Administered 2015-01-10 – 2015-01-11 (×2): 325 mg via ORAL
  Filled 2015-01-10 (×4): qty 1

## 2015-01-10 MED ORDER — ALBUTEROL SULFATE (2.5 MG/3ML) 0.083% IN NEBU
INHALATION_SOLUTION | RESPIRATORY_TRACT | Status: AC
  Filled 2015-01-10: qty 3

## 2015-01-10 NOTE — Clinical Social Work Note (Signed)
CSW spoke with Suella GroveJackie Neal with [facility]- [phone].  Ms. Jennette Kettleeal states that patient has been living at their group home for 3-4 years and is welcome to return.  At time of DC CSW to call Ms. Neal to set up transportation.  Patients sister and POA [name]([name]) agreeable to plan for return to group home.  CSW will continue to follow.  Merlyn LotJenna Holoman, LCSWA Clinical Social Worker [phone]

## 2015-01-10 NOTE — Clinical Documentation Improvement (Signed)
01/10/15  MD's, NP'S, and PA's   H/H noted at (12.7/38.8) then about 24 hours later (9.0/26.5) and then 5 hours later was (7.3/21.9), BP 93/65 / 97/58 / 105/52.  He received 2000 ml of NS and was transfused with 2 units of PRBC . If either of the following clinical conditions are appropriate for this admission please document in the notes. Thank you   Possible Clinical Conditions?     Acute Blood Loss Anemia  Acute on chronic blood loss anemia  Chronic blood loss anemia  Precipitous drop in Hematocrit  Other Condition  Cannot Clinically Determine      Risk Factors: hx of IDA, GIB, Barrott's esophagus  Diagnostics: CBC   H&H after TX: 10.1/21.7  IV fluids: 75 ml/hr NS IV  Serial H&H monitoring CBC q 6 hours  Medications: Iron 325 mg daily  Thank You, Lavonda JumboLawanda J Trino Higinbotham ,RN Clinical Documentation Specialist:  [phone]  [facility]- Health Information Management

## 2015-01-10 NOTE — Evaluation (Signed)
Physical Therapy Evaluation Patient Details Name: [name] MRN: [id number] DOB: [birth year] Today's Date: 01/10/2015   History of Present Illness  This 59 y.o male admitted with 3 day h/o nausea, vomiting with black coffee ground emesis and black stool .   Dx:  GIB likely due to erosive esophagitis.  PMH includes:  h/o Barrett's esophagus; schizophrenia; mental retardation; seizure disorder; anemia; GERD; gastroparesis   Clinical Impression  Patient demonstrates deficits in functional mobility as indicated below. Will need continued skilled PT to address deficits and maximize function. Will see as indicated and progress as tolerated. Recommend HHPT upon discharge.    Follow Up Recommendations Home health PT;Supervision for mobility/OOB    Equipment Recommendations  None recommended by PT    Recommendations for Other Services       Precautions / Restrictions Precautions Precautions: Fall      Mobility  Bed Mobility Overal bed mobility: Modified Independent             General bed mobility comments: increased time  Transfers Overall transfer level: Needs assistance Equipment used: Rolling walker (2 wheeled) Transfers: Sit to/from [employer] Sit to Stand: Min guard Stand pivot transfers: Min guard       General transfer comment: Pt requires min guard except when negotiating obstacles, then requires min A to prevent LOB.  Pt is impulsive and requires verbal cues for walker safety   Ambulation/Gait Ambulation/Gait assistance: Min guard;Min assist Ambulation Distance (Feet): 240 Feet Assistive device: Rolling walker (2 wheeled) Gait Pattern/deviations: Step-through pattern;Decreased stride length;Drifts right/left (toe out gait) Gait velocity: significantly increased Gait velocity interpretation: at or above normal speed for age/gender General Gait Details: patient impulsively fast with gait, instability noted due to impuliveness, one  episode of take the RW onto 2 wheels, assist for safety  Stairs            Wheelchair Mobility    Modified Rankin (Stroke Patients Only)       Balance Overall balance assessment: Needs assistance Sitting-balance support: Feet supported Sitting balance-Leahy Scale: Good     Standing balance support: No upper extremity supported Standing balance-Leahy Scale: Fair               High level balance activites: Side stepping;Direction changes;Turns High Level Balance Comments: patient with poor positioning and safety during turns with use of RW, cues to stay within RW and control movement             Pertinent Vitals/Pain Pain Assessment: No/denies pain    Home Living Family/patient expects to be discharged to:: Assisted living       Home Access: Ramped entrance       Home Equipment: Walker - 2 wheels;Grab bars - tub/shower Additional Comments: walk in shower, with grab bars    Prior Function Level of Independence: Independent with assistive device(s)         Comments: Pt reports he was able to perform all BADLs modified independently.  Ambulatad with RW     Hand Dominance   Dominant Hand: Right    Extremity/Trunk Assessment   Upper Extremity Assessment: Generalized weakness           Lower Extremity Assessment: Defer to PT evaluation      Cervical / Trunk Assessment: Normal  Communication      Cognition Arousal/Alertness: Awake/alert Behavior During Therapy: WFL for tasks assessed/performed;Impulsive Overall Cognitive Status: History of cognitive impairments - at baseline  General Comments      Exercises        Assessment/Plan    PT Assessment Patient needs continued PT services  PT Diagnosis Difficulty walking   PT Problem List Decreased activity tolerance;Decreased balance;Decreased mobility;Decreased safety awareness  PT Treatment Interventions DME instruction;Gait training;Functional  mobility training;Therapeutic activities;Therapeutic exercise;Balance training;Patient/family education   PT Goals (Current goals can be found in the Care Plan section) Acute Rehab PT Goals Patient Stated Goal: to go home  PT Goal Formulation: With patient Time For Goal Achievement: 01/24/15 Potential to Achieve Goals: Good    Frequency Min 3X/week   Barriers to discharge        Co-evaluation               End of Session Equipment Utilized During Treatment: Gait belt Activity Tolerance: Patient tolerated treatment well Patient left: in bed;with call bell/phone within reach;with bed alarm set Nurse Communication: Mobility status         Time: [id number] PT Time Calculation (min) (ACUTE ONLY): 21 min   Charges:   PT Evaluation $Initial PT Evaluation Tier I: 1 Procedure     PT G CodesFabio Asa:        Delton Stelle J 01/10/2015, 3:25 PM Charlotte Crumbevon Tanijah Morais, PT DPT  [phone]

## 2015-01-10 NOTE — Evaluation (Signed)
Occupational Therapy Evaluation Patient Details Name: Gerald LimerickRoosevelt P Carnathan MRN: [id number] DOB: November 09, 1956 Today's Date: 01/10/2015    History of Present Illness This 59 y.o male admitted with 3 day h/o nausea, vomiting with black coffee ground emesis and black stool .   Dx:  GIB likely due to erosive esophagitis.  PMH includes:  h/o Barrett's esophagus; schizophrenia; mental retardation; seizure disorder; anemia; GERD; gastroparesis    Clinical Impression   Pt admitted with above. He demonstrates the below listed deficits and will benefit from continued OT to maximize safety and independence with BADLs.  Currently, requires min a for BADLs due to impaired balance and impulsivity.  Anticipate he is close to his baseline.  Will follow.      Follow Up Recommendations  Supervision/Assistance - 24 hour;No OT follow up    Equipment Recommendations  None recommended by OT    Recommendations for Other Services       Precautions / Restrictions Precautions Precautions: Fall      Mobility Bed Mobility Overal bed mobility: Modified Independent             General bed mobility comments: increased time  Transfers Overall transfer level: Needs assistance Equipment used: Rolling walker (2 wheeled) Transfers: Sit to/from UGI CorporationStand;Stand Pivot Transfers Sit to Stand: Min guard Stand pivot transfers: Min guard       General transfer comment: Pt requires min guard except when negotiating obstacles, then requires min A to prevent LOB.  Pt is impulsive and requires verbal cues for walker safety     Balance Overall balance assessment: Needs assistance Sitting-balance support: Feet supported Sitting balance-Leahy Scale: Good     Standing balance support: No upper extremity supported Standing balance-Leahy Scale: Fair                              ADL Overall ADL's : Needs assistance/impaired Eating/Feeding: Independent   Grooming: Wash/dry hands;Wash/dry face;Brushing  hair;Min guard;Standing   Upper Body Bathing: Set up;Sitting   Lower Body Bathing: Minimal assistance;Sit to/from stand   Upper Body Dressing : Set up;Sitting   Lower Body Dressing: Minimal assistance;Sit to/from stand   Toilet Transfer: Minimal assistance;Ambulation;Comfort height toilet;RW   Toileting- ArchitectClothing Manipulation and Hygiene: Min guard;Sit to/from stand       Functional mobility during ADLs: Minimal assistance General ADL Comments: Pt requires min A with BADLs due to impaired balance      Vision                     Perception     Praxis      Pertinent Vitals/Pain Pain Assessment: No/denies pain     Hand Dominance Right   Extremity/Trunk Assessment Upper Extremity Assessment Upper Extremity Assessment: Generalized weakness   Lower Extremity Assessment Lower Extremity Assessment: Defer to PT evaluation   Cervical / Trunk Assessment Cervical / Trunk Assessment: Normal   Communication     Cognition Arousal/Alertness: Awake/alert Behavior During Therapy: WFL for tasks assessed/performed;Impulsive Overall Cognitive Status: History of cognitive impairments - at baseline                     General Comments       Exercises       Shoulder Instructions      Home Living Family/patient expects to be discharged to:: Assisted living       Home Access: Ramped entrance  Home Equipment: Walker - 2 wheels;Grab bars - tub/shower   Additional Comments: walk in shower, with grab bars      Prior Functioning/Environment Level of Independence: Independent with assistive device(s)        Comments: Pt reports he was able to perform all BADLs modified independently.  Ambulatad with RW    OT Diagnosis: Generalized weakness;Cognitive deficits   OT Problem List: Decreased strength;Decreased activity tolerance;Impaired balance (sitting and/or standing)   OT Treatment/Interventions: Self-care/ADL training;DME  and/or AE instruction;Balance training;Patient/family education    OT Goals(Current goals can be found in the care plan section) Acute Rehab OT Goals Patient Stated Goal: to go home  OT Goal Formulation: With patient Time For Goal Achievement: 01/24/15 Potential to Achieve Goals: Good ADL Goals Pt Will Perform Grooming: with modified independence;standing Pt Will Perform Lower Body Bathing: with modified independence;sit to/from stand Pt Will Perform Lower Body Dressing: with modified independence;sit to/from stand Pt Will Transfer to Toilet: with modified independence;ambulating;regular height toilet;bedside commode;grab bars Pt Will Perform Toileting - Clothing Manipulation and hygiene: with modified independence;sit to/from stand  OT Frequency: Min 2X/week   Barriers to D/C:            Co-evaluation              End of Session Equipment Utilized During Treatment: Engineer, water Communication: Mobility status  Activity Tolerance: Patient tolerated treatment well Patient left: in bed;with call bell/phone within reach;with bed alarm set   Time: 1414-1433 OT Time Calculation (min): 19 min Charges:  OT General Charges $OT Visit: 1 Procedure OT Evaluation $Initial OT Evaluation Tier I: 1 Procedure OT Treatments $Therapeutic Activity: 8-22 mins G-Codes:    Kolby Schara M 15-Jan-2015, 3:00 PM

## 2015-01-10 NOTE — Progress Notes (Signed)
Shift Event Paged secondary to pt with inc WOB, wheezing.  RR 30s, 93% on 3L [place]. Per RR RN, chest tight and wheezy. Pt resonded well to breathing treatment.    SOB -Responded well to breathing tx -CXR- limited study-no definie PNA or edema -BNP elevated at 219.3 -Ordered 20mg  IV lasix, which pt responded well to with good urinary output and much improvement in resp status  Illa LevelSahar Osman [facility]

## 2015-01-10 NOTE — Progress Notes (Signed)
    Progress Note   Subjective  feels okay. No BMs / no nausea/     Objective   Vital signs in last 24 hours: Temp:  [98.5 F (36.9 C)-99.5 F (37.5 C)] 99.4 F (37.4 C) (01/18 0814) Pulse Rate:  [74-106] 81 (01/18 0814) Resp:  [24-45] 30 (01/18 0814) BP: (93-128)/(42-82) 97/61 mmHg (01/18 0814) SpO2:  [89 %-100 %] 95 % (01/18 0814)   General:    Black male in NAD Abdomen:  Soft, nontender, protuberant, tympanitic,with a few bowel sounds.  Extremities:  Without edema. Neurologic:  Alert and oriented,  grossly normal neurologically. Psych:  Cooperative. Normal mood and affect.  Lab Results:  Recent Labs  01/09/15 1920 01/10/15 0015 01/10/15 0810  WBC 5.8 5.3 5.2  HGB 10.0* 9.7* 10.4*  HCT 29.4* 28.9* 31.1*  PLT 152 148* 178   BMET  Recent Labs  01/08/15 1428 01/09/15 1247 01/10/15 0015  NA 141 141 140  K 4.0 3.8 3.6  CL 105 109 108  CO2 31 23 28   GLUCOSE 129* 94 104*  BUN 31* 14 8  CREATININE 1.21 1.12 1.02  CALCIUM 8.5 7.4* 7.6*   LFT  Recent Labs  01/09/15 1247  PROT 5.1*  ALBUMIN 2.7*  AST 18  ALT 14  ALKPHOS 42  BILITOT 0.6   PT/INR  Recent Labs  01/08/15 1730  LABPROT 14.1  INR 1.07      Assessment / Plan:   641. 59 year old male with upper GI bleed, likely secondary to erosive esophagitis seen on EGD though no stigmata of bleeding on EGD. No further bleeding / no further nausea / vomiting. Will advance to full liquids. Continue BID PPI while hospitalized then can change to PO dosing upon discharge.   2. Hx of Barrett's esophagus, seen again on EGD yesterday. Biopsies pending. Continue daily PPI upon discharge.   3. Anemia of acute blood loss, requiring transfusion. Hgb stable at 10.4 now.     LOS: 2 days   Willette Clusteraula Guenther  01/10/2015, 10:05 AM   GI ATTENDING  The patient's case was reviewed with colleagues in GI morning report. Interval history and data reviewed. Patient personally seen and examined. Agree with progress note as  outlined above. The patient has recurrent erosive esophagitis off PPI. This was the cause for GI bleeding. No further bleeding with stable hemoglobin on PPI. Importance of compliance with daily PPI stressed. Outpatient follow-up with Dr. Russella DarStark as needed. Will sign off. Thank you  Wilhemina BonitoJohn N. Eda KeysPerry, Jr., M.D. [facility] Division of Gastro[facility]ology

## 2015-01-10 NOTE — Clinical Social Work Psychosocial (Signed)
Clinical Social Work Department BRIEF PSYCHOSOCIAL ASSESSMENT 01/10/2015  Patient:  [name],[name]     Account Number:  [id number]     Admit date:  01/08/2015  Clinical Social Worker:  Merlyn LotHOLOMAN,Britanny Marksberry, CLINICAL SOCIAL WORKER  Date/Time:  01/10/2015 03:59 PM  Referred by:  Physician  Date Referred:  01/10/2015 Referred for  Other - See comment   Other Referral:   Pt from Group home   Interview type:  Other - See comment Other interview type:   Group home coordinator    PSYCHOSOCIAL DATA Living Status:  FACILITY Admitted from facility:  Other Level of care:  Group Home Primary support name:  [name] Primary support relationship to patient:  SIBLING Degree of support available:   high level of support from sister and group home employees    CURRENT CONCERNS Current Concerns  Post-Acute Placement   Other Concerns:    SOCIAL WORK ASSESSMENT / PLAN CSW spoke with [facility] concerning patient return to their group home- facility is agreeable to accepting patient back and states that patient has been a resident for 3.5 years.  Patients sister also at bedside and agreeable to patient return to group home.   Assessment/plan status:  Psychosocial Support/Ongoing Assessment of Needs Other assessment/ plan:   Information/referral to community resources:    PATIENT'S/FAMILY'S RESPONSE TO PLAN OF CARE: Patients family is agreeable [facility] and is hopeful that patient will be well enough to return home tomorrow.       Merlyn LotJenna Holoman, LCSWA Clinical Social Worker [phone]

## 2015-01-10 NOTE — Progress Notes (Signed)
Gerald BoroughsS. [name] paged in reference to pt's increasing SOB and tachypnea RR 30-40. Pt also wheezing and congested.

## 2015-01-10 NOTE — Significant Event (Signed)
Rapid Response Event Note Called by primary RN to see pt for increased WOB Overview: Time Called: 0142 Arrival Time: 0144 Event Type: Respiratory  Initial Focused Assessment: On assessment pt is sleepy but with RR 35-40, sats 93% 3LNC, with increasing O2 demands during the night.  BBS tight & wheezy.  Pt arousable and denies pain but only speaks one word responses.  Interventions: PCXR Albuterol neb tx BNP Event Summary: PA on call for primary team contacted by primary RN prior to my arrival.  After neb tx pt BS were clear upper & very diminished & wheezy in bases, RR 32-34, sats 95% on 3L.    at          [facility] Monnie Gudgel Hedgecock

## 2015-01-10 NOTE — Progress Notes (Signed)
OT Cancellation Note  Patient Details Name: [name] MRN: [id number] DOB: July 16, 1956   Cancelled Treatment:    Reason Eval/Treat Not Completed: Patient not medically ready - pt currently on strict bedrest. Will initiate OT eval once activity increased.  [name], [name], [name] [phone]' 01/10/2015, 10:52 AM

## 2015-01-10 NOTE — Progress Notes (Signed)
PT Cancellation Note  Patient Details Name: [name] MRN: [id number] DOB: 09-18-1956   Cancelled Treatment:    Reason Eval/Treat Not Completed: Other (comment)Patient with 'strict bedrest orders' at this time, please update activity orders for PT evaluation.   Fabio AsaWerner, Jalynn Waddell J 01/10/2015, 8:13 AM Charlotte Crumbevon Esgar Barnick, PT DPT  [phone]

## 2015-01-10 NOTE — Progress Notes (Signed)
Updated family regarding poss discharge tomorrow.

## 2015-01-10 NOTE — Progress Notes (Signed)
[place] TEAM 1 - Stepdown/ICU TEAM Progress Note  Gerald LimerickRoosevelt P Higuchi RUE:[id number] DOB: 04-Jun-1956 DOA: 01/08/2015 PCP: No PCP Per Patient  Admit HPI / Brief Narrative: 59 y.o. M who lives in a group home w/ Hx schizophrenia, mental handicap, seizure disorder, chronic anemia, GERD, gastroparesis, Barrett's esophagus, and prior GI bleeding who presented with nausea, diarrhea, black stool, and black coffee ground appearing emesis for 3 days.    Work up in the ED demonstrated positive FOBT. Hemoglobin decreased from a previous 12.7 on 01/07/15 to 9.0, and then further decreased to 7.3, negative troponin, lactate 1.75, negative lipase. Patient was tachycardic.  GI was consulted by ED.  HPI/Subjective: Pt has some increased work of breathing this morning.  CXR was unrevealing.  At the time of my exam he is in no resp distress.  He has not yet been advanced to a regular diet, and has not yet ambulated to a signif extent.    Assessment/Plan:  GIB w/ hematemesis and melena  -Patient is not on blood thinner. EGD on 2010 showed barrett's esophagus and hiatal hernia. He had a normal colonoscopy on 2009.  -Since his hgb continued to drop to 7.3 and patient developed tachycardia, transfused 2 units PRBC  -continue pantoprazole  -Avoid NSAIDs and SQ heparin  -per GI/EGD "recurrent erosive esophagitis off PPI...was the cause for GI bleeding" -outpt f/u w/ Dr. Russella DarStark as needed   Acute blood loss anemia  -due to GIB - s/p 2U PRBC - Hgb now stable - recheck in AM   Hypothyroidism:  -No recent TSH since 2010 when TSH was 1.0 - is on Synthroid at home. -Continue Synthroid 75 g daily -TSH= 4.0.  Seizure -Patient is on Depakote and Lamictal  Schizophrenia -Stable  Depression:  -continue Seroquel 400 mgQHS  Hyperlipidemia:  Patient is on Lovastatin at home  Code Status: FULL Family Communication: no family present at time of exam today  Disposition Plan: Discharge in a.m. if hemoglobin  stable and able to tolerate ambulation and diet   Consultants: Dr. Melvia Heapsobert Kaplan (GI)  Procedure/Significant Events: 1/17 EGD - erosive esophagitis, Barrett's esophagus  Antibiotics: NA  DVT prophylaxis: SCD  Objective: Blood pressure 97/61, pulse 81, temperature 99.4 F (37.4 C), temperature source Oral, resp. rate 30, height 5\' 3"  (1.6 m), weight 69.5 kg (153 lb 3.5 oz), SpO2 95 %.  Intake/Output Summary (Last 24 hours) at 01/10/15 1122 Last data filed at 01/10/15 1029  Gross per 24 hour  Intake   1346 ml  Output   2250 ml  Net   -904 ml   Exam: General: No acute respiratory distress Lungs: Clear to auscultation bilaterally without wheezes or crackles Cardiovascular: Regular rate and rhythm without murmur gallop or rub  Abdomen: Nontender, nondistended, soft, bowel sounds positive, no rebound, no ascites, no appreciable mass Extremities: No significant cyanosis, clubbing, or edema bilateral lower extremities  Data Reviewed: Basic Metabolic Panel:  Recent Labs Lab 01/07/15 1359 01/08/15 1428 01/09/15 1247 01/10/15 0015  NA 138 141 141 140  K 4.3 4.0 3.8 3.6  CL 109 105 109 108  CO2 21 31 23 28   GLUCOSE 130* 129* 94 104*  BUN 50* 31* 14 8  CREATININE 1.04 1.21 1.12 1.02  CALCIUM 7.3* 8.5 7.4* 7.6*   Liver Function Tests:  Recent Labs Lab 01/08/15 1428 01/09/15 1247  AST 21 18  ALT 19 14  ALKPHOS 44 42  BILITOT 0.5 0.6  PROT 5.7* 5.1*  ALBUMIN 3.2* 2.7*    Recent  Labs Lab 01/08/15 1730  LIPASE 22   CBC:  Recent Labs Lab 01/07/15 1315 01/08/15 1428 01/08/15 1947 01/09/15 1247 01/09/15 1920 01/10/15 0015 01/10/15 0810  WBC 8.1 6.2 5.4 4.8 5.8 5.3 5.2  NEUTROABS 6.1 4.0  --   --   --   --   --   HGB 12.7* 9.0* 7.3* 10.1* 10.0* 9.7* 10.4*  HCT 38.8* 26.5* 21.9* 29.7* 29.4* 28.9* 31.1*  MCV 92.4 88.9 89.0 87.9 88.0 90.3 88.4  PLT 196 184 158 143* 152 148* 178   Cardiac Enzymes:  Recent Labs Lab 01/07/15 1359 01/08/15 1730  01/08/15 2305 01/09/15 1247  TROPONINI <0.03 <0.03 <0.03 <0.03   CBG:  Recent Labs Lab 01/07/15 1238 01/09/15 0805 01/10/15 0812  GLUCAP 99 88 118*    Recent Results (from the past 240 hour(s))  Urine culture     Status: None   Collection Time: 01/08/15  7:38 PM  Result Value Ref Range Status   Specimen Description URINE, RANDOM  Final   Special Requests NONE  Final   Colony Count NO GROWTH Performed at [facility]   Final   Culture NO GROWTH Performed at [facility]   Final   Report Status 01/10/2015 FINAL  Final  MRSA PCR Screening     Status: None   Collection Time: 01/08/15  9:15 PM  Result Value Ref Range Status   MRSA by PCR NEGATIVE NEGATIVE Final    Comment:        The GeneXpert MRSA Assay (FDA approved for NASAL specimens only), is one component of a comprehensive MRSA colonization surveillance program. It is not intended to diagnose MRSA infection nor to guide or monitor treatment for MRSA infections.     Studies:  Recent x-ray studies have been reviewed in detail by the Attending Physician  Scheduled Meds:  Scheduled Meds: . sodium chloride   Intravenous STAT  . albuterol      . ALPRAZolam  0.5 mg Oral Once  . divalproex  500 mg Oral Daily  . ferrous sulfate  325 mg Oral Q breakfast  . lamoTRIgine  100 mg Oral Daily  . levothyroxine  75 mcg Oral QAC breakfast  . pantoprazole  40 mg Oral BID  . pravastatin  20 mg Oral q1800  . propranolol  10 mg Oral TID  . QUEtiapine  400 mg Oral QHS  . sodium chloride  3 mL Intravenous Q12H    Time spent on care of this patient: 25 mins  Lonia Blood, MD [facility] For Consults/Admissions - Flow Manager - [phone] Office  [phone]  Contact MD directly via text page:      [email or web address]      password [facility]  01/10/2015, 11:21 AM   LOS: 2 days

## 2015-01-11 DIAGNOSIS — K2961 Other gastritis with bleeding: Secondary | ICD-10-CM | POA: Insufficient documentation

## 2015-01-11 LAB — COMPREHENSIVE METABOLIC PANEL
ALT: 22 U/L (ref 0–53)
AST: 31 U/L (ref 0–37)
Albumin: 2.9 g/dL — ABNORMAL LOW (ref 3.5–5.2)
Alkaline Phosphatase: 50 U/L (ref 39–117)
Anion gap: 11 (ref 5–15)
BUN: 6 mg/dL (ref 6–23)
CO2: 27 mmol/L (ref 19–32)
CREATININE: 1.05 mg/dL (ref 0.50–1.35)
Calcium: 8.6 mg/dL (ref 8.4–10.5)
Chloride: 103 mEq/L (ref 96–112)
GFR calc Af Amer: 89 mL/min — ABNORMAL LOW (ref >90)
GFR calc non Af Amer: 77 mL/min — ABNORMAL LOW (ref >90)
Glucose, Bld: 117 mg/dL — ABNORMAL HIGH (ref 70–99)
Potassium: 4 mmol/L (ref 3.5–5.1)
SODIUM: 141 mmol/L (ref 135–145)
Total Bilirubin: 0.5 mg/dL (ref 0.3–1.2)
Total Protein: 5.7 g/dL — ABNORMAL LOW (ref 6.0–8.3)

## 2015-01-11 LAB — CBC
HEMATOCRIT: 32.7 % — AB (ref 39.0–52.0)
HEMOGLOBIN: 10.9 g/dL — AB (ref 13.0–17.0)
MCH: 29.8 pg (ref 26.0–34.0)
MCHC: 33.3 g/dL (ref 30.0–36.0)
MCV: 89.3 fL (ref 78.0–100.0)
Platelets: 194 10*3/uL (ref 150–400)
RBC: 3.66 MIL/uL — ABNORMAL LOW (ref 4.22–5.81)
RDW: 13.5 % (ref 11.5–15.5)
WBC: 4.9 10*3/uL (ref 4.0–10.5)

## 2015-01-11 LAB — GLUCOSE, CAPILLARY: GLUCOSE-CAPILLARY: 110 mg/dL — AB (ref 70–99)

## 2015-01-11 LAB — HIV ANTIBODY (ROUTINE TESTING W REFLEX)
HIV 1/O/2 Abs-Index Value: <1 (ref <1.00)
HIV-1/HIV-2 Ab: NONREACTIVE

## 2015-01-11 MED ORDER — PANTOPRAZOLE SODIUM 40 MG PO TBEC: 40.0000 mg | DELAYED_RELEASE_TABLET | Freq: Two times a day (BID) | ORAL | Status: DC

## 2015-01-11 NOTE — Progress Notes (Signed)
Occupational Therapy Treatment and Discharge Patient Details Name: Gerald LimerickRoosevelt P [name] MRN: [id number] DOB: [birth year] Today's Date: 01/11/2015    History of present illness This 59 y.o male admitted with 3 day h/o nausea, vomiting with black coffee ground emesis and black stool .   Dx:  GIB likely due to erosive esophagitis.  PMH includes:  h/o Barrett's esophagus; schizophrenia; mental retardation; seizure disorder; anemia; GERD; gastroparesis    OT comments  This 59 yo male showing progress today and how to use walker splint for LUE. He is to D/C to group home today so we will D/C him from acute care OT.  Follow Up Recommendations  Supervision/Assistance - 24 hour;No OT follow up    Equipment Recommendations  None recommended by OT       Precautions / Restrictions Precautions Precautions: Fall       Mobility Bed Mobility               General bed mobility comments: Pt up in recliner upon arrival  Transfers Overall transfer level: Needs assistance Equipment used: Rolling walker (2 wheeled) Transfers: Sit to/from Stand Sit to Stand: Min guard Stand pivot transfers: Min guard       General transfer comment: Got a call from PT who said that she felt like pt would benfit from a walker splint for his LUE due to it kept sliding off of the walker. Placed the walker splint on the walker on left side and pt practiced x3 putting his hand in splint and using velcro strap to secure and unsecure for sit<>stand. I talked with person from group home to let them know that pt would have this for his walkeer to use at home.        ADL Overall ADL's : Needs assistance/impaired                     Lower Body Dressing: Min guard;Sit to/from stand   Toilet Transfer: Minimal assistance;Ambulation;Comfort height toilet;RW   Toileting- ArchitectClothing Manipulation and Hygiene: Min guard;Sit to/from stand                          Cognition   Behavior During Therapy:  [facility] LLCWFL for tasks assessed/performed Overall Cognitive Status: History of cognitive impairments - at baseline                                    Pertinent Vitals/ Pain       Pain Assessment: No/denies pain         Frequency Min 2X/week     Progress Toward Goals  OT Goals(current goals can now be found in the care plan section)  Progress towards OT goals: Progressing toward goals     Plan Discharge plan remains appropriate       End of Session Equipment Utilized During Treatment: Rolling walker   Activity Tolerance Patient tolerated treatment well   Patient Left in chair;with call bell/phone within reach;with chair alarm set   Nurse Communication  (Pt needs a RW for home use)        Time: 1320-1350 OT Time Calculation (min): 30 min  Charges: OT General Charges $OT Visit: 1 Procedure OT Treatments $Self Care/Home Management : 23-37 mins  Evette GeorgesLeonard, Kerie Badger Eva [phone] 01/11/2015, 4:45 PM

## 2015-01-11 NOTE — Care Management Note (Signed)
    Page 1 of 1   01/11/2015     2:41:58 PM CARE MANAGEMENT NOTE 01/11/2015  Patient:  Jed LimerickROBINSON,Hulbert P   Account Number:  [id number]  Date Initiated:  01/11/2015  Documentation initiated by:  Carlethia Mesquita  Subjective/Objective Assessment:   dx GI Bleed; lives in group home     Action/Plan:   Anticipated DC Date:  01/11/2015   Anticipated DC Plan:  HOME W HOME HEALTH SERVICES  In-house referral  Clinical Social Worker      DC Planning Services  CM consult      Choice offered to / List presented to:  NA   DME arranged  WALKER - Lavone NianOLLING      DME agency  [facility]     HH arranged  HH-2 PT      HH agency  OTHER - SEE NOTE   Status of service:  Completed, signed off Medicare Important Message given?  YES (If response is "NO", the following Medicare IM given date fields will be blank) Date Medicare IM given:  01/11/2015 Medicare IM given by:  Rebeca Valdivia Date Additional Medicare IM given:   Additional Medicare IM given by:    Discharge Disposition:  HOME W HOME HEALTH SERVICES  Per UR Regulation:  Reviewed for med. necessity/level of care/duration of stay  If discussed at Long Length of Stay Meetings, dates discussed:    Comments:  01/11/15 1433 Lorri Fukuhara RN MSN BSN CCM Pt to d/c back to group home, OT recommends no f/u, PT recommends home therapy.  TC to pt's nurse @ group home, Ferrer ComunidadGilbertina [phone]).  Group home has their own physical therapist who will provide his therapy.  F2F, order, and PT/OT eval notes faxed to [phone].

## 2015-01-11 NOTE — Progress Notes (Signed)
Vomited indigested food after supper tonight.

## 2015-01-11 NOTE — Clinical Social Work Note (Addendum)
CSW informed Suella GroveJackie Neal, group home administrator, of patient [name].  Group home can pick up patient at 1:30pm- will come to patient room.  Clinicals faxed to Group home nursing staff.  Family called: left message  CSW signing off.  Merlyn LotJenna Holoman, LCSWA Clinical Social Worker [phone]

## 2015-01-11 NOTE — Discharge Summary (Signed)
Physician Discharge Summary  [name] BJY:[id number] DOB: 09/20/1956 DOA: 01/08/2015  PCP: No PCP Per Patient  Admit date: 01/08/2015 Discharge date: 01/11/2015  Time spent: 40 minutes  Recommendations for Outpatient Follow-up:  GIB and nausea, vomiting and diarrhea:  -Patient is not on blood thinner. EGD on 2010 showed barrett's esophagus and hiatal hernia. He had a normal colonoscopy on 2009.  -Since his hg continued to drop to 7.3 and patient developed tachycardia, transfused 2 units PRBC  - Avoid NSAIDs and SQ heparin  -Resolved -Continue Protonix 40 mg BID -Patient is to have meals while sitting in a chair or have his bed elevated to greater than 30 for 30 minutes postprandial -Follow-up with PCP  Hypothyroidism:  -Continue Synthroid 75 g daily - TSH= 4.0. -Follow-up with PCP   Seizure: Patient is on Depakote and Lamictal -Continue Lamictal 100 mg daily  -Continue Depakote 500 mg daily  -Follow-up with PCP  Schizophrenia -Stable  Depression:  -continue Seroquel 400 mgQHS -Follow-up with PCP  Hyperlipidemia:  -Patient is on Lovastatin at home, restart    Discharge Diagnoses:  Principal Problem:   GIB (gastrointestinal bleeding) Active Problems:   Hypothyroidism   Schizophrenia, unspecified type   Depression   Essential hypertension   GERD   Gastroparesis   History of diverticulosis   Seizure   Nausea & vomiting   Acute esophagitis   Upper gastrointestinal bleed   Diarrhea   Nausea with vomiting   Discharge Condition: Stable  Diet recommendation: Regular diet  Filed Weights   01/08/15 1318 01/08/15 2053  Weight: 61.689 kg (136 lb) 69.5 kg (153 lb 3.5 oz)    History of present illness:  [name] is a 59 y.o. BM who lives in a group home PMHx schizophrenia, mental retardation, seizure disorder, anemia, GERD, gastroparesis, Barrett's esophagus, history of GI bleeding, who presents with nausea, diarrhea, black stool,  black coffee ground appearing emesis.  Patient is from group home and is accompanied by Production designer, theatre/television/film. Per manager (Ms. Shon Baton), in the past 3 days, patient has nausea, vomiting with black coffee ground appearing emesis. He was also noticed to have black stool. Patient does not have fever or chills. It is not sure whether patient has any abdominal pain or chest pain, since patient says "no" to all questions when asked whether he has any pain. Patient seems to not have headaches, cough, chest pain, SOB, dysuria, urgency, frequency, hematuria, skin rashes or leg swelling. 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 After that surgery, he developed left arm weakness and rigidity. Recently, his left arm seems be more sluggish per Production designer, theatre/television/film.  Work up in the ED demonstrates positive FOBT. Hemoglobin decreased from a previous 12.7 on 01/07/15 to 9.0, and then further decreased to 7.3, negative troponin, lactate 1.75, negative lipase. Patient is tachycardia. Patient is admitted to inpatient for further evaluation and treatment. GI was consulted by ED.  1/19 alert, states negative N/V/abdominal pain. States was able to eat breakfast without any vomiting. During patient's stay 1/17 EGD;erosive esophagitis, Barrett's esophagus. Patient will need to avoid all NSAIDs, and stay on Protonix upon discharge.      Consultants: Dr. Melvia Heaps (GI)    Procedure/Significant Events: 1/17 EGD;erosive esophagitis, Barrett's esophagus   Culture 1/16 C. Difficile pending 1/16 GI pathogen panel pending 1/17 HIV pending 1/17Gastric biopsies pending    Discharge Exam: Filed Vitals:   01/11/15 0500 01/11/15 0813 01/11/15 0906 01/11/15 1100  BP: 122/74 113/80  122/95 114/86  Pulse: 82 95 81 69  Temp:  98.5 F (36.9 C)  98.3 F (36.8 C)  TempSrc:  Oral  Oral  Resp: 28 21  16   Height:      Weight:      SpO2: 98% 92%  94%    General: Alert, follows all commands, No acute  respiratory distress Lungs: Clear to auscultation bilaterally without wheezes or crackles Cardiovascular: Regular rate and rhythm without murmur gallop or rub normal S1 and S2 Abdomen: Nontender, nondistended, soft, bowel sounds positive, no rebound, no ascites, no appreciable mass Extremities: No significant cyanosis, clubbing, or edema bilateral lower extremities  Discharge Instructions     Medication List    ASK your doctor about these medications        divalproex 500 MG DR tablet  Commonly known as:  DEPAKOTE  Take 500 mg by mouth daily.     ferrous sulfate 325 (65 FE) MG tablet  Take 325 mg by mouth daily with breakfast.     lamoTRIgine 100 MG tablet  Commonly known as:  LAMICTAL  Take 100 mg by mouth daily.     levothyroxine 75 MCG tablet  Commonly known as:  SYNTHROID, LEVOTHROID  Take 75 mcg by mouth daily.     lovastatin 20 MG tablet  Commonly known as:  MEVACOR  Take 20 mg by mouth at bedtime.     omeprazole 20 MG capsule  Commonly known as:  PRILOSEC  Take 20 mg by mouth daily.     propranolol 10 MG tablet  Commonly known as:  INDERAL  Take 10 mg by mouth 3 (three) times daily.     QUEtiapine 400 MG tablet  Commonly known as:  SEROQUEL  Take 400 mg by mouth at bedtime.     Vitamin D (Ergocalciferol) 50000 UNITS Caps capsule  Commonly known as:  DRISDOL  Take 50,000 Units by mouth every 30 (thirty) days.       Allergies  Allergen Reactions  . Vancomycin       The results of significant diagnostics from this hospitalization (including imaging, microbiology, ancillary and laboratory) are listed below for reference.    Significant Diagnostic Studies: Dg Chest Port 1 View  01/10/2015   CLINICAL DATA:  Shortness of breath  EXAM: PORTABLE CHEST - 1 VIEW  COMPARISON:  01/08/2015  FINDINGS: No cardiomegaly. There is similar upper mediastinal contours when accounting for hypoaeration and rotation. Question mild gaseous distention of the upper thoracic  esophagus. Diffuse interstitial crowding without definitive pneumonia or edema. No effusion or pneumothorax.  IMPRESSION: Study limited by low volumes.  No definite intrathoracic disease.   Electronically Signed   By: Tiburcio PeaJonathan  Watts M.D.   On: 01/10/2015 02:46   Dg Chest Portable 1 View  01/08/2015   CLINICAL DATA:  Nausea, vomiting, weakness, diarrhea for a few days.  EXAM: PORTABLE CHEST - 1 VIEW  COMPARISON:  01/07/2015  FINDINGS: The heart size and mediastinal contours are within normal limits. Both lungs are clear. The visualized skeletal structures are unremarkable.  IMPRESSION: No active disease.   Electronically Signed   By: Charlett NoseKevin  Dover M.D.   On: 01/08/2015 18:55   Dg Abd Acute W/chest  01/07/2015   CLINICAL DATA:  Diarrhea for 2 days, weakness.  EXAM: ACUTE ABDOMEN SERIES (ABDOMEN 2 VIEW & CHEST 1 VIEW)  COMPARISON:  02/16/2013  FINDINGS: Moderate-sized hiatal hernia. Heart is normal size. Minimal left base atelectasis. Right lung is clear. No effusions.  Nonobstructive bowel  gas pattern. No free air. No organomegaly or suspicious calcification. No acute bony abnormality.  IMPRESSION: No acute findings.  Moderate-sized hiatal hernia.  No active cardiopulmonary disease.   Electronically Signed   By: Charlett Nose M.D.   On: 01/07/2015 14:53    Microbiology: Recent Results (from the past 240 hour(s))  Urine culture     Status: None   Collection Time: 01/08/15  7:38 PM  Result Value Ref Range Status   Specimen Description URINE, RANDOM  Final   Special Requests NONE  Final   Colony Count NO GROWTH Performed at [facility]   Final   Culture NO GROWTH Performed at [facility]   Final   Report Status 01/10/2015 FINAL  Final  MRSA PCR Screening     Status: None   Collection Time: 01/08/15  9:15 PM  Result Value Ref Range Status   MRSA by PCR NEGATIVE NEGATIVE Final    Comment:        The GeneXpert MRSA Assay (FDA approved for NASAL specimens only), is one component  of a comprehensive MRSA colonization surveillance program. It is not intended to diagnose MRSA infection nor to guide or monitor treatment for MRSA infections.      Labs: Basic Metabolic Panel:  Recent Labs Lab 01/07/15 1359 01/08/15 1428 01/09/15 1247 01/10/15 0015 01/11/15 0307  NA 138 141 141 140 141  K 4.3 4.0 3.8 3.6 4.0  CL 109 105 109 108 103  CO2 GLUCOSE 130* 129* 94 104* 117*  BUN 50* 31* CREATININE 1.04 1.21 1.12 1.02 1.05  CALCIUM 7.3* 8.5 7.4* 7.6* 8.6   Liver Function Tests:  Recent Labs Lab 01/08/15 1428 01/09/15 1247 01/11/15 0307  AST ALT ALKPHOS 44 42 50  BILITOT 0.5 0.6 0.5  PROT 5.7* 5.1* 5.7*  ALBUMIN 3.2* 2.7* 2.9*    Recent Labs Lab 01/08/15 1730  LIPASE 22   No results for input(s): AMMONIA in the last 168 hours. CBC:  Recent Labs Lab 01/07/15 1315 01/08/15 1428  01/09/15 1247 01/09/15 1920 01/10/15 0015 01/10/15 0810 01/11/15 0307  WBC 8.1 6.2  < > 4.8 5.8 5.3 5.2 4.9  NEUTROABS 6.1 4.0  --   --   --   --   --   --   HGB 12.7* 9.0*  < > 10.1* 10.0* 9.7* 10.4* 10.9*  HCT 38.8* 26.5*  < > 29.7* 29.4* 28.9* 31.1* 32.7*  MCV 92.4 88.9  < > 87.9 88.0 90.3 88.4 89.3  PLT 196 184  < > 143* 152 148* 178 194  < > = values in this interval not displayed. Cardiac Enzymes:  Recent Labs Lab 01/07/15 1359 01/08/15 1730 01/08/15 2305 01/09/15 1247  TROPONINI <0.03 <0.03 <0.03 <0.03   BNP: BNP (last 3 results) No results for input(s): PROBNP in the last 8760 hours. CBG:  Recent Labs Lab 01/07/15 1238 01/09/15 0805 01/10/15 0812  GLUCAP 99 88 118*       Signed:  Carolyne Littles, MD [facility] [phone] pager

## 2015-01-11 NOTE — Progress Notes (Signed)
No vomiting after eating breakfast this  am.

## 2015-07-19 ENCOUNTER — Encounter: Payer: Self-pay | Admitting: Gastroenterology

## 2015-07-23 ENCOUNTER — Inpatient Hospital Stay (HOSPITAL_COMMUNITY)
Admission: EM | Admit: 2015-07-23 | Discharge: 2015-07-27 | DRG: 392 | Disposition: A | Discharge status: Home Health | Payer: Medicare Other | Attending: Internal Medicine | Admitting: Internal Medicine

## 2015-07-23 ENCOUNTER — Encounter (HOSPITAL_COMMUNITY): Payer: Self-pay

## 2015-07-23 DIAGNOSIS — D509 Iron deficiency anemia, unspecified: Secondary | ICD-10-CM | POA: Diagnosis present

## 2015-07-23 DIAGNOSIS — R569 Unspecified convulsions: Secondary | ICD-10-CM | POA: Diagnosis present

## 2015-07-23 DIAGNOSIS — Z79899 Other long term (current) drug therapy: Secondary | ICD-10-CM

## 2015-07-23 DIAGNOSIS — F32A Depression, unspecified: Secondary | ICD-10-CM | POA: Diagnosis present

## 2015-07-23 DIAGNOSIS — Z7982 Long term (current) use of aspirin: Secondary | ICD-10-CM

## 2015-07-23 DIAGNOSIS — R1110 Vomiting, unspecified: Secondary | ICD-10-CM | POA: Diagnosis not present

## 2015-07-23 DIAGNOSIS — K2100 Gastro-esophageal reflux disease with esophagitis, without bleeding: Secondary | ICD-10-CM | POA: Diagnosis present

## 2015-07-23 DIAGNOSIS — F209 Schizophrenia, unspecified: Secondary | ICD-10-CM | POA: Diagnosis present

## 2015-07-23 DIAGNOSIS — F79 Unspecified intellectual disabilities: Secondary | ICD-10-CM | POA: Diagnosis present

## 2015-07-23 DIAGNOSIS — F329 Major depressive disorder, single episode, unspecified: Secondary | ICD-10-CM | POA: Diagnosis present

## 2015-07-23 DIAGNOSIS — K920 Hematemesis: Secondary | ICD-10-CM | POA: Insufficient documentation

## 2015-07-23 DIAGNOSIS — I10 Essential (primary) hypertension: Secondary | ICD-10-CM | POA: Diagnosis present

## 2015-07-23 DIAGNOSIS — Z881 Allergy status to other antibiotic agents status: Secondary | ICD-10-CM

## 2015-07-23 DIAGNOSIS — Z8719 Personal history of other diseases of the digestive system: Secondary | ICD-10-CM | POA: Diagnosis present

## 2015-07-23 DIAGNOSIS — E780 Pure hypercholesterolemia: Secondary | ICD-10-CM | POA: Diagnosis present

## 2015-07-23 DIAGNOSIS — K567 Ileus, unspecified: Secondary | ICD-10-CM

## 2015-07-23 DIAGNOSIS — K3184 Gastroparesis: Secondary | ICD-10-CM | POA: Diagnosis present

## 2015-07-23 DIAGNOSIS — K2270 Barrett's esophagus without dysplasia: Secondary | ICD-10-CM | POA: Diagnosis present

## 2015-07-23 DIAGNOSIS — E039 Hypothyroidism, unspecified: Secondary | ICD-10-CM | POA: Diagnosis present

## 2015-07-23 LAB — COMPREHENSIVE METABOLIC PANEL
ALBUMIN: 3.6 g/dL (ref 3.5–5.0)
ALT: 18 U/L (ref 17–63)
AST: 21 U/L (ref 15–41)
Alkaline Phosphatase: 71 U/L (ref 38–126)
Anion gap: 10 (ref 5–15)
BUN: 30 mg/dL — ABNORMAL HIGH (ref 6–20)
CO2: 26 mmol/L (ref 22–32)
CREATININE: 1.15 mg/dL (ref 0.61–1.24)
Calcium: 9.1 mg/dL (ref 8.9–10.3)
Chloride: 102 mmol/L (ref 101–111)
GFR calc Af Amer: >60 mL/min (ref >60)
Glucose, Bld: 184 mg/dL — ABNORMAL HIGH (ref 65–99)
POTASSIUM: 4.1 mmol/L (ref 3.5–5.1)
Sodium: 138 mmol/L (ref 135–145)
Total Bilirubin: 0.6 mg/dL (ref 0.3–1.2)
Total Protein: 6.8 g/dL (ref 6.5–8.1)

## 2015-07-23 LAB — URINALYSIS, ROUTINE W REFLEX MICROSCOPIC
Bilirubin Urine: NEGATIVE
GLUCOSE, UA: NEGATIVE mg/dL
HGB URINE DIPSTICK: NEGATIVE
Ketones, ur: 15 mg/dL — AB
Leukocytes, UA: NEGATIVE
Nitrite: NEGATIVE
PH: 6 (ref 5.0–8.0)
Protein, ur: NEGATIVE mg/dL
SPECIFIC GRAVITY, URINE: 1.029 (ref 1.005–1.030)
Urobilinogen, UA: 1 mg/dL (ref 0.0–1.0)

## 2015-07-23 LAB — CBC
HCT: 40.3 % (ref 39.0–52.0)
HEMOGLOBIN: 13.6 g/dL (ref 13.0–17.0)
MCH: 33.1 pg (ref 26.0–34.0)
MCHC: 33.7 g/dL (ref 30.0–36.0)
MCV: 98.1 fL (ref 78.0–100.0)
Platelets: 211 10*3/uL (ref 150–400)
RBC: 4.11 MIL/uL — ABNORMAL LOW (ref 4.22–5.81)
RDW: 12.8 % (ref 11.5–15.5)
WBC: 7.7 10*3/uL (ref 4.0–10.5)

## 2015-07-23 LAB — LIPASE, BLOOD: Lipase: 21 U/L — ABNORMAL LOW (ref 22–51)

## 2015-07-23 MED ORDER — FAMOTIDINE IN NACL 20-0.9 MG/50ML-% IV SOLN
20.0000 mg | INTRAVENOUS | Status: AC
  Administered 2015-07-23: 20 mg via INTRAVENOUS
  Filled 2015-07-23: qty 50

## 2015-07-23 MED ORDER — PANTOPRAZOLE SODIUM 40 MG IV SOLR
40.0000 mg | INTRAVENOUS | Status: AC
  Administered 2015-07-23: 40 mg via INTRAVENOUS
  Filled 2015-07-23: qty 40

## 2015-07-23 NOTE — ED Notes (Signed)
Pt reports not ffeeling well and per care giver at group home pt ate dinner and then was found in bathroom over toilet and vomiting black liquid.

## 2015-07-23 NOTE — ED Provider Notes (Signed)
CSN: [id number]     Arrival date & time 07/23/15  2139 History   First MD Initiated Contact with Patient 07/23/15 2153     Chief Complaint  Patient presents with  . Abdominal Pain  . Emesis     (Consider location/radiation/quality/duration/timing/severity/associated sxs/prior Treatment) HPI Comments: Patient is a 59 year old male with a past medical history of schizophrenia, mental retardation, barretts esophagus, GERD, and previous upper GI bleed who presents from his group home after 1 episode of coffee ground emesis. Staff member from the group home is present and provides some information. He reports the patient had a decreased appetite today and shortly after dinner time he had 1 episode of coffee ground emesis. He reports copious amount. The patient reports some abdominal pain at that time but no longer has abdominal pain. Patient had an upper GI bleed in January 2016 and reports this is similar to that episode. No aggravating/alleviating factors. No other associated symptoms.   Patient is a 59 y.o. male presenting with abdominal pain and vomiting.  Abdominal Pain Associated symptoms: vomiting   Emesis Associated symptoms: abdominal pain     Past Medical History  Diagnosis Date  . Schizophrenia   . Mental retardation   . Seizure   . Barrett esophagus   . Dysphagia   . Iron deficiency anemia   . Hypercholesteremia   . GERD (gastroesophageal reflux disease)   . Gastroparesis    Past Surgical History  Procedure Laterality Date  . Craniotomy for cyst fenestration    . Esophagogastroduodenoscopy N/A 01/09/2015    Procedure: ESOPHAGOGASTRODUODENOSCOPY (EGD);  Surgeon: Louis Meckel, MD;  Location: [facility] ENDOSCOPY;  Service: Endoscopy;  Laterality: N/A;   History reviewed. No pertinent family history. History  Substance Use Topics  . Smoking status: Never Smoker   . Smokeless tobacco: Never Used  . Alcohol Use: No    Review of Systems  Gastrointestinal: Positive for  vomiting and abdominal pain.  All other systems reviewed and are negative.     Allergies  Vancomycin  Home Medications   Prior to Admission medications   Medication Sig Start Date End Date Taking? Authorizing Provider  aspirin EC 81 MG tablet Take 81 mg by mouth as needed.   Yes Historical Provider, MD  divalproex (DEPAKOTE) 500 MG DR tablet Take 500 mg by mouth daily.   Yes Historical Provider, MD  ferrous sulfate 325 (65 FE) MG tablet Take 325 mg by mouth daily with breakfast.   Yes Historical Provider, MD  lamoTRIgine (LAMICTAL) 100 MG tablet Take 100 mg by mouth daily.   Yes Historical Provider, MD  levothyroxine (SYNTHROID, LEVOTHROID) 75 MCG tablet Take 75 mcg by mouth daily.   Yes Historical Provider, MD  lovastatin (MEVACOR) 20 MG tablet Take 20 mg by mouth at bedtime.   Yes Historical Provider, MD  omeprazole (PRILOSEC) 20 MG capsule Take 20 mg by mouth daily.   Yes Historical Provider, MD  pantoprazole (PROTONIX) 40 MG tablet Take 1 tablet (40 mg total) by mouth 2 (two) times daily. 01/11/15  Yes Drema Dallas, MD  propranolol (INDERAL) 10 MG tablet Take 10 mg by mouth 3 (three) times daily.   Yes Historical Provider, MD  QUEtiapine (SEROQUEL) 400 MG tablet Take 400 mg by mouth at bedtime.   Yes Historical Provider, MD  Vitamin D, Ergocalciferol, (DRISDOL) 50000 UNITS CAPS Take 50,000 Units by mouth every 30 (thirty) days.   Yes Historical Provider, MD   BP 131/87 mmHg  Pulse 100  Temp(Src) 98.8 F (37.1 C) (Oral)  Resp 18  Ht  (1.6 m)  Wt 153 lb (69.4 kg)  BMI 27.11 kg/m2  SpO2 97% Physical Exam  Constitutional: He is oriented to person, place, and time. He appears well-developed and well-nourished. No distress.  HENT:  Head: Normocephalic and atraumatic.  Eyes: Conjunctivae and EOM are normal.  Neck: Normal range of motion.  Cardiovascular: Regular rhythm.  Exam reveals no gallop and no friction rub.   No murmur heard. tachycardic  Pulmonary/Chest: Effort  normal and breath sounds normal. He has no wheezes. He has no rales. He exhibits no tenderness.  Abdominal: Soft. He exhibits no distension. There is no tenderness. There is no rebound.  Musculoskeletal: Normal range of motion.  Neurological: He is alert and oriented to person, place, and time. Coordination normal.  Speech is goal-oriented. Moves limbs without ataxia.   Skin: Skin is warm and dry.  Psychiatric: He has a normal mood and affect. His behavior is normal.  Nursing note and vitals reviewed.   ED Course  Procedures (including critical care time) Labs Review Labs Reviewed  LIPASE, BLOOD - Abnormal; Notable for the following:    Lipase 21 (*)    All other components within normal limits  COMPREHENSIVE METABOLIC PANEL - Abnormal; Notable for the following:    Glucose, Bld 184 (*)    BUN 30 (*)    All other components within normal limits  CBC - Abnormal; Notable for the following:    RBC 4.11 (*)    All other components within normal limits  URINALYSIS, ROUTINE W REFLEX MICROSCOPIC (NOT AT [facility]) - Abnormal; Notable for the following:    Ketones, ur 15 (*)    All other components within normal limits    Imaging Review No results found.   EKG Interpretation None      MDM   Final diagnoses:  Coffee ground emesis    10:57 PM Patient mildly tachycardic at 110. Patient afebrile. Labs unremarkable for acute changes. Hemoglobin stable.  12:33 AM Patient will be admitted for observation by Dr. Robb Matar.     [address] [place], PA-C 07/24/15 0033  Eber Hong, MD 07/24/15 [phone]

## 2015-07-23 NOTE — ED Provider Notes (Signed)
The patient is a very pleasant 59 year old male, he does have some mental retardation, he lives in a group home, the group home supervisor states that this evening he did not want eat his dinner, he was having some abdominal discomfort, he went to the bathroom and vomited up a large amount of coffee-ground emesis. He has not wanted to eat, on exam the patient has a soft abdomen, minimal epigastric tenderness, clear heart and lung sounds though he does have some tachycardia. He is not anemic based on labs, he does have an elevated BUN which could be consistent with a upper GI bleed. He has been given Protonix, Pepcid, overnight observation for likely upper GI bleed. Of note the endoscopy from 6 months ago showed no signs of varices.  Medical screening examination/treatment/procedure(s) were conducted as a shared visit with non-physician practitioner(s) and myself.  I personally evaluated the patient during the encounter.  Clinical Impression:   Final diagnoses:  Coffee ground emesis         Eber Hong, MD 07/24/15 1354

## 2015-07-24 ENCOUNTER — Encounter (HOSPITAL_COMMUNITY): Payer: Self-pay

## 2015-07-24 ENCOUNTER — Observation Stay (HOSPITAL_COMMUNITY): Payer: Medicare Other

## 2015-07-24 DIAGNOSIS — I10 Essential (primary) hypertension: Secondary | ICD-10-CM | POA: Diagnosis present

## 2015-07-24 DIAGNOSIS — R112 Nausea with vomiting, unspecified: Secondary | ICD-10-CM

## 2015-07-24 DIAGNOSIS — F329 Major depressive disorder, single episode, unspecified: Secondary | ICD-10-CM | POA: Diagnosis present

## 2015-07-24 DIAGNOSIS — K920 Hematemesis: Secondary | ICD-10-CM | POA: Insufficient documentation

## 2015-07-24 DIAGNOSIS — F79 Unspecified intellectual disabilities: Secondary | ICD-10-CM | POA: Diagnosis present

## 2015-07-24 DIAGNOSIS — E039 Hypothyroidism, unspecified: Secondary | ICD-10-CM | POA: Diagnosis present

## 2015-07-24 DIAGNOSIS — K2270 Barrett's esophagus without dysplasia: Secondary | ICD-10-CM

## 2015-07-24 DIAGNOSIS — Z79899 Other long term (current) drug therapy: Secondary | ICD-10-CM | POA: Diagnosis not present

## 2015-07-24 DIAGNOSIS — K3184 Gastroparesis: Secondary | ICD-10-CM | POA: Diagnosis present

## 2015-07-24 DIAGNOSIS — R1110 Vomiting, unspecified: Secondary | ICD-10-CM | POA: Diagnosis present

## 2015-07-24 DIAGNOSIS — E780 Pure hypercholesterolemia: Secondary | ICD-10-CM | POA: Diagnosis present

## 2015-07-24 DIAGNOSIS — Z881 Allergy status to other antibiotic agents status: Secondary | ICD-10-CM | POA: Diagnosis not present

## 2015-07-24 DIAGNOSIS — K210 Gastro-esophageal reflux disease with esophagitis: Secondary | ICD-10-CM | POA: Diagnosis present

## 2015-07-24 DIAGNOSIS — D509 Iron deficiency anemia, unspecified: Secondary | ICD-10-CM | POA: Diagnosis present

## 2015-07-24 DIAGNOSIS — R569 Unspecified convulsions: Secondary | ICD-10-CM | POA: Diagnosis present

## 2015-07-24 DIAGNOSIS — Z7982 Long term (current) use of aspirin: Secondary | ICD-10-CM | POA: Diagnosis not present

## 2015-07-24 DIAGNOSIS — F209 Schizophrenia, unspecified: Secondary | ICD-10-CM | POA: Diagnosis present

## 2015-07-24 LAB — COMPREHENSIVE METABOLIC PANEL
ALK PHOS: 54 U/L (ref 38–126)
ALT: 14 U/L — ABNORMAL LOW (ref 17–63)
AST: 18 U/L (ref 15–41)
Albumin: 3 g/dL — ABNORMAL LOW (ref 3.5–5.0)
Anion gap: 7 (ref 5–15)
BUN: 36 mg/dL — ABNORMAL HIGH (ref 6–20)
CO2: 25 mmol/L (ref 22–32)
Calcium: 8.6 mg/dL — ABNORMAL LOW (ref 8.9–10.3)
Chloride: 107 mmol/L (ref 101–111)
Creatinine, Ser: 1.07 mg/dL (ref 0.61–1.24)
GFR calc non Af Amer: >60 mL/min (ref >60)
Glucose, Bld: 164 mg/dL — ABNORMAL HIGH (ref 65–99)
Potassium: 4.2 mmol/L (ref 3.5–5.1)
Sodium: 139 mmol/L (ref 135–145)
Total Bilirubin: 0.5 mg/dL (ref 0.3–1.2)
Total Protein: 5.8 g/dL — ABNORMAL LOW (ref 6.5–8.1)

## 2015-07-24 LAB — CBC
HCT: 34.4 % — ABNORMAL LOW (ref 39.0–52.0)
HCT: 30.9 % — ABNORMAL LOW (ref 39.0–52.0)
HEMOGLOBIN: 11.8 g/dL — AB (ref 13.0–17.0)
HEMOGLOBIN: 10.4 g/dL — AB (ref 13.0–17.0)
MCH: 33.1 pg (ref 26.0–34.0)
MCH: 32.3 pg (ref 26.0–34.0)
MCHC: 34.3 g/dL (ref 30.0–36.0)
MCHC: 33.7 g/dL (ref 30.0–36.0)
MCV: 96.6 fL (ref 78.0–100.0)
MCV: 96 fL (ref 78.0–100.0)
PLATELETS: 175 10*3/uL (ref 150–400)
Platelets: 191 10*3/uL (ref 150–400)
RBC: 3.56 MIL/uL — ABNORMAL LOW (ref 4.22–5.81)
RBC: 3.22 MIL/uL — AB (ref 4.22–5.81)
RDW: 12.8 % (ref 11.5–15.5)
RDW: 12.9 % (ref 11.5–15.5)
WBC: 7.3 10*3/uL (ref 4.0–10.5)
WBC: 6.7 10*3/uL (ref 4.0–10.5)

## 2015-07-24 MED ORDER — ONDANSETRON HCL 4 MG/2ML IJ SOLN: 4.0000 mg | Freq: Four times a day (QID) | INTRAMUSCULAR | Status: DC | PRN

## 2015-07-24 MED ORDER — PROPRANOLOL HCL 10 MG PO TABS
10.0000 mg | ORAL_TABLET | Freq: Three times a day (TID) | ORAL | Status: DC
  Administered 2015-07-24 – 2015-07-27 (×10): 10 mg via ORAL
  Filled 2015-07-24 (×12): qty 1

## 2015-07-24 MED ORDER — PANTOPRAZOLE SODIUM 40 MG IV SOLR
40.0000 mg | Freq: Two times a day (BID) | INTRAVENOUS | Status: DC
  Administered 2015-07-24 – 2015-07-25 (×3): 40 mg via INTRAVENOUS
  Filled 2015-07-24 (×4): qty 40

## 2015-07-24 MED ORDER — LAMOTRIGINE 100 MG PO TABS
100.0000 mg | ORAL_TABLET | Freq: Every day | ORAL | Status: DC
  Administered 2015-07-24 – 2015-07-27 (×4): 100 mg via ORAL
  Filled 2015-07-24 (×4): qty 1

## 2015-07-24 MED ORDER — DIVALPROEX SODIUM 500 MG PO DR TAB
500.0000 mg | DELAYED_RELEASE_TABLET | Freq: Every day | ORAL | Status: DC
  Administered 2015-07-24 – 2015-07-27 (×4): 500 mg via ORAL
  Filled 2015-07-24 (×4): qty 1

## 2015-07-24 MED ORDER — SUCRALFATE 1 GM/10ML PO SUSP
1.0000 g | Freq: Three times a day (TID) | ORAL | Status: DC
  Administered 2015-07-24 – 2015-07-27 (×10): 1 g via ORAL
  Filled 2015-07-24 (×12): qty 10

## 2015-07-24 MED ORDER — SODIUM CHLORIDE 0.9 % IV SOLN
INTRAVENOUS | Status: DC
  Administered 2015-07-24: 02:00:00 via INTRAVENOUS

## 2015-07-24 MED ORDER — SODIUM CHLORIDE 0.9 % IJ SOLN
3.0000 mL | Freq: Two times a day (BID) | INTRAMUSCULAR | Status: DC
  Administered 2015-07-24 – 2015-07-26 (×5): 3 mL via INTRAVENOUS

## 2015-07-24 MED ORDER — LEVOTHYROXINE SODIUM 75 MCG PO TABS
75.0000 ug | ORAL_TABLET | Freq: Every day | ORAL | Status: DC
  Administered 2015-07-24 – 2015-07-27 (×4): 75 ug via ORAL
  Filled 2015-07-24 (×5): qty 1

## 2015-07-24 MED ORDER — ONDANSETRON HCL 4 MG PO TABS: 4.0000 mg | ORAL_TABLET | Freq: Four times a day (QID) | ORAL | Status: DC | PRN

## 2015-07-24 MED ORDER — QUETIAPINE FUMARATE 400 MG PO TABS
400.0000 mg | ORAL_TABLET | Freq: Every day | ORAL | Status: DC
  Administered 2015-07-24 – 2015-07-26 (×3): 400 mg via ORAL
  Filled 2015-07-24 (×4): qty 1

## 2015-07-24 NOTE — H&P (Addendum)
[facility] History and Physical  [name]:[id number] DOB: Sep 06, 1956 DOA: 07/23/2015  Referring physician: Emilia Beck, PA-C PCP: No PCP Per Patient   Chief Complaint: Coffee-ground emesis  HPI: [name] is a 59 y.o. male with below past medical history who lives at a group home due to a history of intellectual disability who is brought in by one of his caregivers to the emergency department due to an episode of coffee-ground emesis. Patient has a recent history of GI bleed at the beginning of the year due to Barrett's esophagus. He is also tachycardic and his BUN level is increased over baseline which could mean active GI bleed. However, the patient is unable to provide significant details and we will admit for observation to rule in or rule out the possibility of a GI bleed.  He is currently in no acute distress.  Review of Systems:  Unable to fully evaluate due to patient's history.  Past Medical History  Diagnosis Date  . Schizophrenia   . Mental retardation   . Seizure   . Barrett esophagus   . Dysphagia   . Iron deficiency anemia   . Hypercholesteremia   . GERD (gastroesophageal reflux disease)   . Gastroparesis    Past Surgical History  Procedure Laterality Date  . Craniotomy for cyst fenestration    . Esophagogastroduodenoscopy N/A 01/09/2015    Procedure: ESOPHAGOGASTRODUODENOSCOPY (EGD);  Surgeon: Gerald Meckel, MD;  Location[facility] ENDOSCOPY;  Service: Endoscopy;  Laterality: N/A;   Social History:  reports that he has never smoked. He has never used smokeless tobacco. He reports that he does not drink alcohol or use illicit drugs.  Allergies  Allergen Reactions  . Vancomycin Other (See Comments)    unknown    History reviewed. No pertinent family history.  Unable to obtain.  Prior to Admission medications   Medication Sig Start Date End Date Taking? Authorizing Provider  aspirin EC 81 MG tablet Take 81 mg by mouth  as needed.   Yes Historical Provider, MD  divalproex (DEPAKOTE) 500 MG DR tablet Take 500 mg by mouth daily.   Yes Historical Provider, MD  ferrous sulfate 325 (65 FE) MG tablet Take 325 mg by mouth daily with breakfast.   Yes Historical Provider, MD  lamoTRIgine (LAMICTAL) 100 MG tablet Take 100 mg by mouth daily.   Yes Historical Provider, MD  levothyroxine (SYNTHROID, LEVOTHROID) 75 MCG tablet Take 75 mcg by mouth daily.   Yes Historical Provider, MD  lovastatin (MEVACOR) 20 MG tablet Take 20 mg by mouth at bedtime.   Yes Historical Provider, MD  omeprazole (PRILOSEC) 20 MG capsule Take 20 mg by mouth daily.   Yes Historical Provider, MD  pantoprazole (PROTONIX) 40 MG tablet Take 1 tablet (40 mg total) by mouth 2 (two) times daily. 01/11/15  Yes Gerald Dallas, MD  propranolol (INDERAL) 10 MG tablet Take 10 mg by mouth 3 (three) times daily.   Yes Historical Provider, MD  QUEtiapine (SEROQUEL) 400 MG tablet Take 400 mg by mouth at bedtime.   Yes Historical Provider, MD  Vitamin D, Ergocalciferol, (DRISDOL) 50000 UNITS CAPS Take 50,000 Units by mouth every 30 (thirty) days.   Yes Historical Provider, MD   Physical Exam: Filed Vitals:   07/23/15 2200 07/23/15 2230 07/23/15 2245 07/23/15 2323  BP: 131/87 109/86 134/79   Pulse: 100 105 107   Temp:      TempSrc:      Resp: 18  Height:      Weight:      SpO2: 97% 96% 95% 95%    Wt Readings from Last 3 Encounters:  07/23/15 69.4 kg (153 lb)  01/08/15 69.5 kg (153 lb 3.5 oz)  07/04/09 61.916 kg (136 lb 8 oz)    General:  Appears calm and comfortable Eyes: PERRL, normal lids, irises & conjunctiva ENT: grossly normal hearing, lips & tongue Neck: no LAD, masses or thyromegaly Cardiovascular: Tachycardic, no m/r/g. No LE edema. Telemetry: Tachycardic at 112 bpm Respiratory: CTA bilaterally, no w/r/r. Normal respiratory effort. Abdomen: Mild distention, soft, positive epigastric tenderness, no guarding, no rebound tenderness. Skin: no  rash or induration seen on limited exam Musculoskeletal: grossly normal tone BUE/BLE Psychiatric: mood and affect , speech fluency corresponding to an earlier developmental age. Neurologic: grossly non-focal.          Labs on Admission:  Basic Metabolic Panel:  Recent Labs Lab 07/23/15 2206  NA 138  K 4.1  CL 102  CO2 26  GLUCOSE 184*  BUN 30*  CREATININE 1.15  CALCIUM 9.1   Liver Function Tests:  Recent Labs Lab 07/23/15 2206  AST 21  ALT 18  ALKPHOS 71  BILITOT 0.6  PROT 6.8  ALBUMIN 3.6    Recent Labs Lab 07/23/15 2206  LIPASE 21*   No results for input(s): AMMONIA in the last 168 hours. CBC:  Recent Labs Lab 07/23/15 2206  WBC 7.7  HGB 13.6  HCT 40.3  MCV 98.1  PLT 211   Cardiac Enzymes: No results for input(s): CKTOTAL, CKMB, CKMBINDEX, TROPONINI in the last 168 hours.  BNP (last 3 results)  Recent Labs  01/10/15 0335  BNP 219.3*     Assessment/Plan Principal Problem:   Emesis Active Problems:   Hypothyroidism   Schizophrenia, unspecified type   Depression   Essential hypertension   Reflux esophagitis   Barrett's esophagus   Admit to telemetry for monitoring. Unsure at this point whether the patient has a GI bleed or not. The patient also has a history of ileus in the past and is currently mildly distended. However, since we don't know whether there is an active bleed or not, and the distention is not significant, I will not order NGT suctioning at this time. Protonix IV every 12 hours. Follow-up H&H. Consider GI consult, if it is indeed a upper GI bleed. Continue pertinent regular medications.  Code Status: Full code DVT Prophylaxis: SCDs. Family Communication: [name] Sister [phone]  Disposition Plan:  Discharge back to group home with outpatient follow-up.  Time spent:  Over 70 minutes.  [name] [facility] Pager [phone].

## 2015-07-24 NOTE — ED Notes (Signed)
Contact information for [facility] for group home is [place] at [phone].

## 2015-07-24 NOTE — Progress Notes (Signed)
Patient seen and examined. Denies CP, fever, abd pain and any further episodes of nausea/vomiting. Patient admitted after midnight secondary to nausea and episode of coffee ground emesis. Vital signs stable. Please referred to H&P written by Dr. Robb Matar for further info/details on admission.  Plan: -follow Hgb -CLD -continue PPI and carafate -given hx of ileus in the past and presentation with abd distension and vomiting will get abd x-ray.Gwenlyn Perking, Mikle Bosworth [phone]

## 2015-07-24 NOTE — Progress Notes (Signed)
Pt tolerated clear liquid breakfast and lunch, but vomited brown liquid after dinner

## 2015-07-25 DIAGNOSIS — K210 Gastro-esophageal reflux disease with esophagitis: Secondary | ICD-10-CM

## 2015-07-25 DIAGNOSIS — I10 Essential (primary) hypertension: Secondary | ICD-10-CM

## 2015-07-25 DIAGNOSIS — E039 Hypothyroidism, unspecified: Secondary | ICD-10-CM

## 2015-07-25 LAB — CBC
HCT: 24.5 % — ABNORMAL LOW (ref 39.0–52.0)
HCT: 24.4 % — ABNORMAL LOW (ref 39.0–52.0)
Hemoglobin: 8.6 g/dL — ABNORMAL LOW (ref 13.0–17.0)
Hemoglobin: 8.2 g/dL — ABNORMAL LOW (ref 13.0–17.0)
MCH: 34 pg (ref 26.0–34.0)
MCH: 32.7 pg (ref 26.0–34.0)
MCHC: 35.1 g/dL (ref 30.0–36.0)
MCHC: 33.6 g/dL (ref 30.0–36.0)
MCV: 96.8 fL (ref 78.0–100.0)
MCV: 97.2 fL (ref 78.0–100.0)
PLATELETS: 167 10*3/uL (ref 150–400)
PLATELETS: 175 10*3/uL (ref 150–400)
RBC: 2.53 MIL/uL — ABNORMAL LOW (ref 4.22–5.81)
RBC: 2.51 MIL/uL — AB (ref 4.22–5.81)
RDW: 13 % (ref 11.5–15.5)
RDW: 13.1 % (ref 11.5–15.5)
WBC: 6.3 10*3/uL (ref 4.0–10.5)
WBC: 7.1 10*3/uL (ref 4.0–10.5)

## 2015-07-25 LAB — PREPARE RBC (CROSSMATCH)

## 2015-07-25 MED ORDER — SODIUM CHLORIDE 0.9 % IV SOLN: Freq: Once | INTRAVENOUS | Status: DC

## 2015-07-25 MED ORDER — METOCLOPRAMIDE HCL 5 MG PO TABS
5.0000 mg | ORAL_TABLET | Freq: Three times a day (TID) | ORAL | Status: DC
  Administered 2015-07-25 – 2015-07-27 (×6): 5 mg via ORAL
  Filled 2015-07-25 (×8): qty 1

## 2015-07-25 MED ORDER — PROCHLORPERAZINE EDISYLATE 5 MG/ML IJ SOLN
5.0000 mg | Freq: Four times a day (QID) | INTRAMUSCULAR | Status: DC | PRN
  Filled 2015-07-25: qty 1

## 2015-07-25 MED ORDER — PANTOPRAZOLE SODIUM 40 MG PO TBEC
40.0000 mg | DELAYED_RELEASE_TABLET | Freq: Two times a day (BID) | ORAL | Status: DC
  Administered 2015-07-25 – 2015-07-27 (×4): 40 mg via ORAL
  Filled 2015-07-25 (×4): qty 1

## 2015-07-25 MED ORDER — FUROSEMIDE 10 MG/ML IJ SOLN
20.0000 mg | Freq: Once | INTRAMUSCULAR | Status: AC
  Administered 2015-07-26: 20 mg via INTRAVENOUS
  Filled 2015-07-25: qty 2

## 2015-07-25 NOTE — Progress Notes (Signed)
Consent for blood was obtained from [name], [place]  and verified by another nurse Herbert Seta, RN.  Amanda Pea, Charity fundraiser.

## 2015-07-25 NOTE — Clinical Social Work Note (Signed)
Clinical Social Work Assessment  Patient Details  Name: BAYNE FOSNAUGH MRN: [id number] Date of Birth: 05-22-1956  Date of referral:  07/25/15               Reason for consult:   (From [facility])                Permission sought to share information with:  Other Permission granted to share information::     Name::        Agency::  [facility]    Relationship::  Tresa Endo RN medical:  Tresa Endo at [phone].  Contact Information:     Housing/Transportation Living arrangements for the past 2 months:  Group Home Source of Information:  Patient, Medical Team, Facility Patient Interpreter Needed:  None Criminal Activity/Legal Involvement Pertinent to Current Situation/Hospitalization:  No - Comment as needed Significant Relationships:  None Lives with:  Self, Facility Resident Do you feel safe going back to the place where you live?  Yes Need for family participation in patient care:  No (Coment)  Care giving concerns:  Patient currently resides in a group home and recieves 24 hour care. No barriers with returning per staff.  Concerned with patient still vomiting as his baseline is a regular diet and he currently is vomiting his liquid diet.  Unclear if patient is medically stable at this time for DC.  Requesting patient return with FL2 in which LCSW will complete.   Social Worker assessment / plan:  LCSW assisting Unit CSW with regards to care coordination of patient.  ?discharge of patient to group home.  Group Home called and patient can return when medically stable and RN concerned patient continues to vomit.  Unit made aware of concerns.  Will complete FL2 as requested.  WIll follow up with medical and if patient DC will complete DC packet and assist with patient returning.  Employment status:  Disabled (Comment on whether or not currently receiving Disability) Insurance information:  Medicare, Medicaid [place] PT Recommendations:  Not assessed at this time Information /  Referral to community resources:  Other (Comment Required) (none at this time)  Patient/Family's Response to care:  Agreeable  Patient/Family's Understanding of and Emotional Response to Diagnosis, Current Treatment, and Prognosis:  Understanding of returning to group home and pending medical clearance for DC.   Emotional Assessment Appearance:  Appears stated age Attitude/Demeanor/Rapport:  Other (calm and cooperative) Affect (typically observed):  Accepting, Adaptable Orientation:  Oriented to Self, Oriented to Place, Oriented to  Time, Oriented to Situation Alcohol / Substance use:  Not Applicable Psych involvement (Current and /or in the community):  No (Comment)  Discharge Needs  Concerns to be addressed:  Denies Needs/Concerns at this time Readmission within the last 30 days:  No Current discharge risk:  None Barriers to Discharge:  No Barriers Identified, Continued Medical Work up   Raye Sorrow, LCSW 07/25/2015, 11:32 AM

## 2015-07-25 NOTE — Progress Notes (Signed)
The patient refused his SCDs. 

## 2015-07-25 NOTE — Progress Notes (Signed)
[facility] PROGRESS NOTE  [name] WUJ:[id number] DOB: 1956/03/09 DOA: 07/23/2015 PCP: No PCP Per Patient  Assessment/Plan: 1-coffee ground emesis -patient with hx of barrett's esophagus and GERD -also with gastroparesis and has been experiencing some vomiting around food in the last 2 days -will continue supportive care -no further blood seen since admission; but still symptomatic -empirically will start reglan; continue PPI, carafate and full liquid diet -follow Hgb trend -if needed, will involve GI -no NSAID's at discharge  2-mood disorder/MR: continue supportive care -continue home meds  3-hypothyroidism: continue synthroid  4-GERD: continue PPI  5-HLD: will hold statins while having trouble tolerating PO's   Code Status: Full Family Communication: no family at bedside Disposition Plan: back to group home when medically stable.   Consultants:  None  Procedures:  None   Antibiotics:  None   HPI/Subjective: Afebrile. No CP. Still with episodes of vomiting. Overall feeling better. No blood appreciated in vomiting content   Objective: Filed Vitals:   07/25/15 1417  BP: 111/58  Pulse: 89  Temp: 98.6 F (37 C)  Resp: 26    Intake/Output Summary (Last 24 hours) at 07/25/15 1521 Last data filed at 07/25/15 1257  Gross per 24 hour  Intake 3251.25 ml  Output    850 ml  Net 2401.25 ml   Filed Weights   07/23/15 2145 07/24/15 0149 07/25/15 0528  Weight: 69.4 kg (153 lb) 69.128 kg (152 lb 6.4 oz) 69.58 kg (153 lb 6.3 oz)    Exam:   General:  Afebrile, no CP. Had another episode of vomiting. No blood seen appreciated in the content   Cardiovascular: S1 and S2, no rubs or gallops  Respiratory: CTA bilaterally  Abdomen: soft, NT, ND, positive BS  Musculoskeletal: no edema, no cyanosis, no clubbing   Data Reviewed: Basic Metabolic Panel:  Recent Labs Lab 07/23/15 2206 07/24/15 0413  NA 138 139  K 4.1 4.2  CL 102 107  CO2  26 25  GLUCOSE 184* 164*  BUN 30* 36*  CREATININE 1.15 1.07  CALCIUM 9.1 8.6*   Liver Function Tests:  Recent Labs Lab 07/23/15 2206 07/24/15 0413  AST 21 18  ALT 18 14*  ALKPHOS 71 54  BILITOT 0.6 0.5  PROT 6.8 5.8*  ALBUMIN 3.6 3.0*    Recent Labs Lab 07/23/15 2206  LIPASE 21*   CBC:  Recent Labs Lab 07/23/15 2206 07/24/15 0413 07/24/15 1820 07/25/15 0541  WBC 7.7 7.3 6.7 6.3  HGB 13.6 11.8* 10.4* 8.6*  HCT 40.3 34.4* 30.9* 24.5*  MCV 98.1 96.6 96.0 96.8  PLT 211 191 175 167   BNP (last 3 results)  Recent Labs  01/10/15 0335  BNP 219.3*   Studies: Dg Abd 2 Views  07/24/2015   CLINICAL DATA:  Ileus  EXAM: ABDOMEN - 2 VIEW  COMPARISON:  01/07/2015  FINDINGS: No dilated loops of large or small bowel. Gas in the rectum. No pathologic calcifications. No organomegaly.  IMPRESSION: No bowel obstruction or ileus.   Electronically Signed   By: Genevive Bi M.D.   On: 07/24/2015 09:51    Scheduled Meds: . sodium chloride   Intravenous Once  . divalproex  500 mg Oral Daily  . lamoTRIgine  100 mg Oral Daily  . levothyroxine  75 mcg Oral QAC breakfast  . metoCLOPramide  5 mg Oral TID AC  . pantoprazole  40 mg Oral BID  . propranolol  10 mg Oral TID  . QUEtiapine  400 mg Oral QHS  .  sodium chloride  3 mL Intravenous Q12H  . sucralfate  1 g Oral TID   Continuous Infusions: . sodium chloride 75 mL/hr at 07/24/15 1610    Principal Problem:   Emesis Active Problems:   Hypothyroidism   Schizophrenia, unspecified type   Depression   Essential hypertension   Reflux esophagitis   Barrett's esophagus   Coffee ground emesis   Time spent: 30 minutes   [name]  [facility] Pager [phone]. If 7PM-7AM, please contact night-coverage at [email or web address], password [facility] 07/25/2015, 3:21 PM  LOS: 1 day

## 2015-07-26 DIAGNOSIS — F209 Schizophrenia, unspecified: Secondary | ICD-10-CM

## 2015-07-26 DIAGNOSIS — F329 Major depressive disorder, single episode, unspecified: Secondary | ICD-10-CM

## 2015-07-26 LAB — CBC
HCT: 29.1 % — ABNORMAL LOW (ref 39.0–52.0)
HEMATOCRIT: 27.6 % — AB (ref 39.0–52.0)
HEMOGLOBIN: 9.7 g/dL — AB (ref 13.0–17.0)
HEMOGLOBIN: 9.3 g/dL — AB (ref 13.0–17.0)
MCH: 31.5 pg (ref 26.0–34.0)
MCH: 31.8 pg (ref 26.0–34.0)
MCHC: 33.3 g/dL (ref 30.0–36.0)
MCHC: 33.7 g/dL (ref 30.0–36.0)
MCV: 94.5 fL (ref 78.0–100.0)
MCV: 94.5 fL (ref 78.0–100.0)
Platelets: 167 10*3/uL (ref 150–400)
Platelets: 156 10*3/uL (ref 150–400)
RBC: 3.08 MIL/uL — ABNORMAL LOW (ref 4.22–5.81)
RBC: 2.92 MIL/uL — AB (ref 4.22–5.81)
RDW: 15.2 % (ref 11.5–15.5)
RDW: 15.2 % (ref 11.5–15.5)
WBC: 7.4 10*3/uL (ref 4.0–10.5)
WBC: 6.5 10*3/uL (ref 4.0–10.5)

## 2015-07-26 LAB — TYPE AND SCREEN
ABO/RH(D): O POS
ANTIBODY SCREEN: NEGATIVE
Unit division: 0

## 2015-07-26 NOTE — Care Management Important Message (Signed)
Important Message  Patient Details  Name: ADRIELL POLANSKY MRN: [id number] Date of Birth: 1956-04-08   Medicare Important Message Given:  Yes-second notification given    Elliot Cousin, RN 07/26/2015, 1:07 PM

## 2015-07-26 NOTE — Progress Notes (Signed)
[facility] PROGRESS NOTE  [name]:[id number] DOB: 27-May-1956 DOA: 07/23/2015 PCP: No PCP Per Patient  Assessment/Plan: 1-coffee ground emesis -patient with hx of barrett's esophagus and GERD; also with gastroparesis and has been experiencing some vomiting around food in the last 2 days PTA. -will continue supportive care -no further blood seen since admission; but still symptomatic -empirically will start reglan TID, continue PPI, carafate and will advance die to soft diet; if tolerated will d/c on 8/3 to group home and patient can follow with GI as an outpatient. -follow Hgb trend (9.7 on 8/2) -if further bloody vomiting of difficulty keeping things down will involve GI -no NSAID's at discharge  2-mood disorder/MR: continue supportive care -continue home meds  3-hypothyroidism: continue synthroid  4-GERD: continue PPI BID  5-HLD: will hold statins while having trouble tolerating PO's   Code Status: Full Family Communication: no family at bedside Disposition Plan: back to group home when medically stable; tolerating soft diet and no further active vomiting.   Consultants:  None  Procedures:  None   Antibiotics:  None   HPI/Subjective: Afebrile. No CP. Still with some vomiting. But not bleeding appreciated. Tolerated full liquid diet on 07/25/15  Objective: Filed Vitals:   07/26/15 0951  BP: 112/51  Pulse: 90  Temp:   Resp: 18    Intake/Output Summary (Last 24 hours) at 07/26/15 1209 Last data filed at 07/26/15 0951  Gross per 24 hour  Intake 1328.33 ml  Output   1050 ml  Net 278.33 ml   Filed Weights   07/24/15 0149 07/25/15 0528 07/26/15 0512  Weight: 69.128 kg (152 lb 6.4 oz) 69.58 kg (153 lb 6.3 oz) 69.128 kg (152 lb 6.4 oz)    Exam:   General:  Afebrile, no CP. Mild non-bloody episode of vomiting this morning (8/2). Hgb 9.7 after one unit of PRBC (given on 8/1)   Cardiovascular: S1 and S2, no rubs or  gallops  Respiratory: CTA bilaterally  Abdomen: soft, NT, ND, positive BS  Musculoskeletal: no edema, no cyanosis, no clubbing   Data Reviewed: Basic Metabolic Panel:  Recent Labs Lab 07/23/15 2206 07/24/15 0413  NA 138 139  K 4.1 4.2  CL 102 107  CO2 26 25  GLUCOSE 184* 164*  BUN 30* 36*  CREATININE 1.15 1.07  CALCIUM 9.1 8.6*   Liver Function Tests:  Recent Labs Lab 07/23/15 2206 07/24/15 0413  AST 21 18  ALT 18 14*  ALKPHOS 71 54  BILITOT 0.6 0.5  PROT 6.8 5.8*  ALBUMIN 3.6 3.0*    Recent Labs Lab 07/23/15 2206  LIPASE 21*   CBC:  Recent Labs Lab 07/24/15 0413 07/24/15 1820 07/25/15 0541 07/25/15 1831 07/26/15 0619  WBC 7.3 6.7 6.3 7.1 7.4  HGB 11.8* 10.4* 8.6* 8.2* 9.7*  HCT 34.4* 30.9* 24.5* 24.4* 29.1*  MCV 96.6 96.0 96.8 97.2 94.5  PLT 191 175 167 175 167   BNP (last 3 results)  Recent Labs  01/10/15 0335  BNP 219.3*   Studies: No results found.  Scheduled Meds: . sodium chloride   Intravenous Once  . sodium chloride   Intravenous Once  . divalproex  500 mg Oral Daily  . lamoTRIgine  100 mg Oral Daily  . levothyroxine  75 mcg Oral QAC breakfast  . metoCLOPramide  5 mg Oral TID AC  . pantoprazole  40 mg Oral BID  . propranolol  10 mg Oral TID  . QUEtiapine  400 mg Oral QHS  . sodium  chloride  3 mL Intravenous Q12H  . sucralfate  1 g Oral TID   Continuous Infusions: . sodium chloride 1,000 mL (07/25/15 1559)    Principal Problem:   Emesis Active Problems:   Hypothyroidism   Schizophrenia, unspecified type   Depression   Essential hypertension   Reflux esophagitis   Barrett's esophagus   Coffee ground emesis   Time spent: 30 minutes   [name]  [facility] Pager [phone]. If 7PM-7AM, please contact night-coverage at [email or web address], password [facility] 07/26/2015, 12:09 PM  LOS: 2 days

## 2015-07-26 NOTE — Progress Notes (Signed)
The patient did not have any complaints of nausea or pain overnight.

## 2015-07-27 DIAGNOSIS — K920 Hematemesis: Secondary | ICD-10-CM

## 2015-07-27 LAB — CBC
HCT: 26.8 % — ABNORMAL LOW (ref 39.0–52.0)
HEMOGLOBIN: 9.1 g/dL — AB (ref 13.0–17.0)
MCH: 32.6 pg (ref 26.0–34.0)
MCHC: 34 g/dL (ref 30.0–36.0)
MCV: 96.1 fL (ref 78.0–100.0)
Platelets: 152 10*3/uL (ref 150–400)
RBC: 2.79 MIL/uL — AB (ref 4.22–5.81)
RDW: 14.8 % (ref 11.5–15.5)
WBC: 4.7 10*3/uL (ref 4.0–10.5)

## 2015-07-27 LAB — BASIC METABOLIC PANEL
Anion gap: 9 (ref 5–15)
BUN: 12 mg/dL (ref 6–20)
CALCIUM: 8.1 mg/dL — AB (ref 8.9–10.3)
CO2: 27 mmol/L (ref 22–32)
CREATININE: 1.01 mg/dL (ref 0.61–1.24)
Chloride: 104 mmol/L (ref 101–111)
GFR calc non Af Amer: >60 mL/min (ref >60)
Glucose, Bld: 118 mg/dL — ABNORMAL HIGH (ref 65–99)
Potassium: 3.1 mmol/L — ABNORMAL LOW (ref 3.5–5.1)
Sodium: 140 mmol/L (ref 135–145)

## 2015-07-27 MED ORDER — METOCLOPRAMIDE HCL 5 MG PO TABS: 5.0000 mg | ORAL_TABLET | Freq: Three times a day (TID) | ORAL | Status: DC

## 2015-07-27 MED ORDER — SUCRALFATE 1 GM/10ML PO SUSP: 1.0000 g | Freq: Three times a day (TID) | ORAL | Status: DC

## 2015-07-27 MED ORDER — PANTOPRAZOLE SODIUM 40 MG PO TBEC: 40.0000 mg | DELAYED_RELEASE_TABLET | Freq: Two times a day (BID) | ORAL | Status: DC

## 2015-07-27 NOTE — Care Management Note (Addendum)
Case Management Note  Patient Details  Name: [name] MRN: [id number] Date of Birth: November 12, 1956  Subjective/Objective:              Nausea, Vomiting      Action/Plan: Group Home  Expected Discharge Date:  07/27/2015             Expected Discharge Plan:  Home/Self Care  In-House Referral:  Clinical Social Work  Discharge planning Services  CM Consult   Status of Service:  Completed, signed off  Medicare Important Message Given:  Yes-second notification given Date Medicare IM Given:    Medicare IM give by:    Date Additional Medicare IM Given:    Additional Medicare Important Message give by:     If discussed at Long Length of Stay Meetings, dates discussed:    Additional Comments: NCM contacted Group Home [facility], spoke to Frederick Peers, Nurse at the [facility]. States they have nursing that comes to the group home daily. Explained HH RN was ordered to follow up on medications management. States they will management his care at group home. Will include HH RN and F2F orders with package to Group Home. They will provide transportation. Made CSW aware.  Elliot Cousin, RN 07/27/2015, 10:36 AM

## 2015-07-27 NOTE — Progress Notes (Signed)
Pt has orders to be discharged. Discharge instructions given and pt has no additional questions at this time. Medication regimen reviewed and pt educated. Pt verbalized understanding and has no additional questions. Telemetry box removed. IV removed and site in good condition. Pt stable and waiting for transportation back to his group home.   Jilda Panda RN

## 2015-07-27 NOTE — Progress Notes (Signed)
The patient did not have any complaints of pain or nausea overnight.

## 2015-07-27 NOTE — Progress Notes (Signed)
Patient forgot denture  After d/c. A staff from group home where patient d/c to came to pick up the denture for the patient.

## 2015-07-27 NOTE — Discharge Summary (Signed)
Physician Discharge Summary  [name] ZOX:[id number] DOB: 11/09/1956 DOA: 07/23/2015  PCP: No PCP Per Patient  Admit date: 07/23/2015 Discharge date: 07/27/2015  Time spent: 35 minutes  Recommendations for Outpatient Follow-up:  1. Needs repeat Hb.  2. Referral to GI if vomiting reoccurs.   Discharge Diagnoses:    Emesis   Hypothyroidism   Schizophrenia, unspecified type   Depression   Essential hypertension   Reflux esophagitis   Barrett's esophagus   Coffee ground emesis   Discharge Condition: stable  Diet recommendation: soft diet  Filed Weights   07/25/15 0528 07/26/15 0512 07/27/15 0543  Weight: 69.58 kg (153 lb 6.3 oz) 69.128 kg (152 lb 6.4 oz) 69.219 kg (152 lb 9.6 oz)    History of present illness:  HPI: [name] is a 59 y.o. male with below past medical history who lives at a group home due to a history of intellectual disability who is brought in by one of his caregivers to the emergency department due to an episode of coffee-ground emesis. Patient has a recent history of GI bleed at the beginning of the year due to Barrett's esophagus. He is also tachycardic and his BUN level is increased over baseline which could mean active GI bleed. However, the patient is unable to provide significant details and we will admit for observation to rule in or rule out the possibility of a GI bleed.  Hospital Course:  1-coffee ground emesis -patient with hx of barrett's esophagus and GERD; also with gastroparesis and has been experiencing some vomiting around food in the last 2 days PTA. -will continue supportive care -no further blood seen since admission;  -empirically will started  reglan TID, continue PPI, carafate and will advance die to soft diet; if tolerated will d/c on 8/3 to group home and patient can follow with GI as an outpatient. -follow Hgb trend (9.7 on 8/2) -no NSAID's at discharge Plan to discharge today if he tolerates diet.   2-mood  disorder/MR: continue supportive care -continue home meds  3-hypothyroidism: continue synthroid  4-GERD: continue PPI BID  5-HLD: will hold statins while having trouble tolerating PO's  Procedures: None  Consultations:  none  Discharge Exam: Filed Vitals:   07/27/15 1009  BP: 115/66  Pulse: 101  Temp:   Resp: 18    General: NAD Cardiovascular: S 1, S 2 RRR Respiratory: CTA  Discharge Instructions   Discharge Instructions    Diet - low sodium heart healthy    Complete by:  As directed      Increase activity slowly    Complete by:  As directed           Current Discharge Medication List    START taking these medications   Details  metoCLOPramide (REGLAN) 5 MG tablet Take 1 tablet (5 mg total) by mouth 3 (three) times daily before meals. Qty: 90 tablet, Refills: 0    sucralfate (CARAFATE) 1 GM/10ML suspension Take 10 mLs (1 g total) by mouth 3 (three) times daily. Qty: 420 mL, Refills: 0      CONTINUE these medications which have CHANGED   Details  pantoprazole (PROTONIX) 40 MG tablet Take 1 tablet (40 mg total) by mouth 2 (two) times daily. Qty: 60 tablet, Refills: 0      CONTINUE these medications which have NOT CHANGED   Details  divalproex (DEPAKOTE) 500 MG DR tablet Take 500 mg by mouth daily.    ferrous sulfate 325 (65 FE) MG tablet Take 325  mg by mouth daily with breakfast.    lamoTRIgine (LAMICTAL) 100 MG tablet Take 100 mg by mouth daily.    levothyroxine (SYNTHROID, LEVOTHROID) 75 MCG tablet Take 75 mcg by mouth daily.    lovastatin (MEVACOR) 20 MG tablet Take 20 mg by mouth at bedtime.    propranolol (INDERAL) 10 MG tablet Take 10 mg by mouth 3 (three) times daily.    QUEtiapine (SEROQUEL) 400 MG tablet Take 400 mg by mouth at bedtime.    Vitamin D, Ergocalciferol, (DRISDOL) 50000 UNITS CAPS Take 50,000 Units by mouth every 30 (thirty) days.      STOP taking these medications     aspirin EC 81 MG tablet      omeprazole (PRILOSEC)  20 MG capsule        Allergies  Allergen Reactions  . Vancomycin Other (See Comments)    unknown   Follow-up Information    Please follow up.   Why:  needs to follow up with PCP       The results of significant diagnostics from this hospitalization (including imaging, microbiology, ancillary and laboratory) are listed below for reference.    Significant Diagnostic Studies: Dg Abd 2 Views  07/24/2015   CLINICAL DATA:  Ileus  EXAM: ABDOMEN - 2 VIEW  COMPARISON:  01/07/2015  FINDINGS: No dilated loops of large or small bowel. Gas in the rectum. No pathologic calcifications. No organomegaly.  IMPRESSION: No bowel obstruction or ileus.   Electronically Signed   By: [name] M.D.   On: 07/24/2015 09:51    Microbiology: No results found for this or any previous visit (from the past 240 hour(s)).   Labs: Basic Metabolic Panel:  Recent Labs Lab 07/23/15 2206 07/24/15 0413  NA 138 139  K 4.1 4.2  CL 102 107  CO2 26 25  GLUCOSE 184* 164*  BUN 30* 36*  CREATININE 1.15 1.07  CALCIUM 9.1 8.6*   Liver Function Tests:  Recent Labs Lab 07/23/15 2206 07/24/15 0413  AST 21 18  ALT 18 14*  ALKPHOS 71 54  BILITOT 0.6 0.5  PROT 6.8 5.8*  ALBUMIN 3.6 3.0*    Recent Labs Lab 07/23/15 2206  LIPASE 21*   No results for input(s): AMMONIA in the last 168 hours. CBC:  Recent Labs Lab 07/25/15 0541 07/25/15 1831 07/26/15 0619 07/26/15 1910 07/27/15 0530  WBC 6.3 7.1 7.4 6.5 4.7  HGB 8.6* 8.2* 9.7* 9.3* 9.1*  HCT 24.5* 24.4* 29.1* 27.6* 26.8*  MCV 96.8 97.2 94.5 94.5 96.1  PLT 167 175 167 156 152   Cardiac Enzymes: No results for input(s): CKTOTAL, CKMB, CKMBINDEX, TROPONINI in the last 168 hours. BNP: BNP (last 3 results)  Recent Labs  01/10/15 0335  BNP 219.3*    ProBNP (last 3 results) No results for input(s): PROBNP in the last 8760 hours.  CBG: No results for input(s): GLUCAP in the last 168 hours.     Signed:  Hartley [name]  A  [facility] 07/27/2015, 10:17 AM

## 2015-08-05 ENCOUNTER — Inpatient Hospital Stay: Payer: Medicare Other | Admitting: Family Medicine

## 2015-08-20 NOTE — Progress Notes (Signed)
OK per MD for d/c back to [facility].  CSW sent DC summary and updated Fl2 to Aggie Cosier at the facility for review and approval.  Facility to transport. CSW notified RN of d/c plan. Message left for patient's sister [name] re: d/c per ok of patient who wants to return to the group home.  No further CSW needs identified. CSW signing off. Lorri Frederick. Jaci Lazier, [place] [phone]

## 2016-01-15 ENCOUNTER — Emergency Department (HOSPITAL_COMMUNITY)
Admission: EM | Admit: 2016-01-15 | Discharge: 2016-01-15 | Disposition: A | Discharge status: Home / Self Care | Payer: Medicare Other | Attending: Emergency Medicine | Admitting: Emergency Medicine

## 2016-01-15 ENCOUNTER — Emergency Department (HOSPITAL_COMMUNITY)
Admission: EM | Admit: 2016-01-15 | Discharge: 2016-01-15 | Disposition: A | Discharge status: Home / Self Care | Payer: Medicare Other

## 2016-01-15 ENCOUNTER — Encounter (HOSPITAL_COMMUNITY): Payer: Self-pay | Admitting: *Deleted

## 2016-01-15 DIAGNOSIS — D509 Iron deficiency anemia, unspecified: Secondary | ICD-10-CM | POA: Diagnosis not present

## 2016-01-15 DIAGNOSIS — K922 Gastrointestinal hemorrhage, unspecified: Secondary | ICD-10-CM | POA: Diagnosis not present

## 2016-01-15 DIAGNOSIS — K920 Hematemesis: Secondary | ICD-10-CM | POA: Diagnosis present

## 2016-01-15 DIAGNOSIS — F209 Schizophrenia, unspecified: Secondary | ICD-10-CM | POA: Diagnosis not present

## 2016-01-15 DIAGNOSIS — Z79899 Other long term (current) drug therapy: Secondary | ICD-10-CM | POA: Insufficient documentation

## 2016-01-15 DIAGNOSIS — K219 Gastro-esophageal reflux disease without esophagitis: Secondary | ICD-10-CM | POA: Insufficient documentation

## 2016-01-15 DIAGNOSIS — Z7982 Long term (current) use of aspirin: Secondary | ICD-10-CM | POA: Insufficient documentation

## 2016-01-15 DIAGNOSIS — E7800 Pure hypercholesterolemia, unspecified: Secondary | ICD-10-CM | POA: Diagnosis not present

## 2016-01-15 LAB — COMPREHENSIVE METABOLIC PANEL
ALT: 14 U/L — AB (ref 17–63)
ANION GAP: 11 (ref 5–15)
AST: 19 U/L (ref 15–41)
Albumin: 4 g/dL (ref 3.5–5.0)
Alkaline Phosphatase: 54 U/L (ref 38–126)
BUN: 36 mg/dL — ABNORMAL HIGH (ref 6–20)
CALCIUM: 9.6 mg/dL (ref 8.9–10.3)
CHLORIDE: 106 mmol/L (ref 101–111)
CO2: 24 mmol/L (ref 22–32)
CREATININE: 1.17 mg/dL (ref 0.61–1.24)
Glucose, Bld: 149 mg/dL — ABNORMAL HIGH (ref 65–99)
Potassium: 4.7 mmol/L (ref 3.5–5.1)
SODIUM: 141 mmol/L (ref 135–145)
Total Bilirubin: 0.5 mg/dL (ref 0.3–1.2)
Total Protein: 7.2 g/dL (ref 6.5–8.1)

## 2016-01-15 LAB — IRON AND TIBC
IRON: 312 ug/dL — AB (ref 45–182)
Saturation Ratios: 78 % — ABNORMAL HIGH (ref 17.9–39.5)
TIBC: 399 ug/dL (ref 250–450)
UIBC: 87 ug/dL

## 2016-01-15 LAB — CBC WITH DIFFERENTIAL/PLATELET
Basophils Absolute: 0 10*3/uL (ref 0.0–0.1)
Basophils Relative: 0 %
EOS ABS: 0 10*3/uL (ref 0.0–0.7)
EOS PCT: 0 %
HCT: 35.9 % — ABNORMAL LOW (ref 39.0–52.0)
Hemoglobin: 11.8 g/dL — ABNORMAL LOW (ref 13.0–17.0)
LYMPHS ABS: 1.5 10*3/uL (ref 0.7–4.0)
LYMPHS PCT: 16 %
MCH: 30.7 pg (ref 26.0–34.0)
MCHC: 32.9 g/dL (ref 30.0–36.0)
MCV: 93.5 fL (ref 78.0–100.0)
MONO ABS: 0.6 10*3/uL (ref 0.1–1.0)
MONOS PCT: 6 %
Neutro Abs: 7.4 10*3/uL (ref 1.7–7.7)
Neutrophils Relative %: 78 %
PLATELETS: 263 10*3/uL (ref 150–400)
RBC: 3.84 MIL/uL — AB (ref 4.22–5.81)
RDW: 13.1 % (ref 11.5–15.5)
WBC: 9.5 10*3/uL (ref 4.0–10.5)

## 2016-01-15 LAB — APTT: aPTT: 27 seconds (ref 24–37)

## 2016-01-15 LAB — TYPE AND SCREEN
ABO/RH(D): O POS
ANTIBODY SCREEN: NEGATIVE

## 2016-01-15 LAB — RETICULOCYTES
RBC.: 3.84 MIL/uL — ABNORMAL LOW (ref 4.22–5.81)
RETIC COUNT ABSOLUTE: 130.6 10*3/uL (ref 19.0–186.0)
RETIC CT PCT: 3.4 % — AB (ref 0.4–3.1)

## 2016-01-15 LAB — FERRITIN: FERRITIN: 26 ng/mL (ref 24–336)

## 2016-01-15 LAB — VITAMIN B12: Vitamin B-12: 598 pg/mL (ref 180–914)

## 2016-01-15 LAB — PROTIME-INR
INR: 1.12 (ref 0.00–1.49)
PROTHROMBIN TIME: 14.6 s (ref 11.6–15.2)

## 2016-01-15 LAB — FOLATE: Folate: 23.3 ng/mL (ref >5.9)

## 2016-01-15 MED ORDER — SODIUM CHLORIDE 0.9 % IV BOLUS (SEPSIS)
1000.0000 mL | Freq: Once | INTRAVENOUS | Status: AC
  Administered 2016-01-15: 1000 mL via INTRAVENOUS

## 2016-01-15 MED ORDER — SUCRALFATE 1 G PO TABS: 1.0000 g | ORAL_TABLET | Freq: Three times a day (TID) | ORAL | Status: DC

## 2016-01-15 MED ORDER — FAMOTIDINE IN NACL 20-0.9 MG/50ML-% IV SOLN
20.0000 mg | INTRAVENOUS | Status: AC
  Administered 2016-01-15: 20 mg via INTRAVENOUS
  Filled 2016-01-15: qty 50

## 2016-01-15 MED ORDER — PANTOPRAZOLE SODIUM 40 MG IV SOLR
40.0000 mg | INTRAVENOUS | Status: AC
  Administered 2016-01-15: 40 mg via INTRAVENOUS
  Filled 2016-01-15: qty 40

## 2016-01-15 NOTE — ED Notes (Signed)
Awake. Verbally responsive. A/O x4. Resp even and unlabored. No audible adventitious breath sounds noted. ABC's intact. IV infusing NS at 999ml/hr without difficulty. 

## 2016-01-15 NOTE — Discharge Instructions (Signed)
#  1 - CAll your GI doctor today - you MUST be seen in the next 7 days for a recheck  #2 - Take Carafate with meals 3 times a day and before bed  #3 - Add Pepcid or Zantac daily -   #4 - Eat ONLY a bland diet until follow up with GI  Please obtain all of your results from medical records or have your doctors office obtain the results - share them with your doctor - you should be seen at your doctors office in the next 2 days. Call today to arrange your follow up. Take the medications as prescribed. Please review all of the medicines and only take them if you do not have an allergy to them. Please be aware that if you are taking birth control pills, taking other prescriptions, ESPECIALLY ANTIBIOTICS may make the birth control ineffective - if this is the case, either do not engage in sexual activity or use alternative methods of birth control such as condoms until you have finished the medicine and your family doctor says it is OK to restart them. If you are on a blood thinner such as COUMADIN, be aware that any other medicine that you take may cause the coumadin to either work too much, or not enough - you should have your coumadin level rechecked in next 7 days if this is the case.  ?  It is also a possibility that you have an allergic reaction to any of the medicines that you have been prescribed - Everybody reacts differently to medications and while MOST people have no trouble with most medicines, you may have a reaction such as nausea, vomiting, rash, swelling, shortness of breath. If this is the case, please stop taking the medicine immediately and contact your physician.  ?  You should return to the ER if you develop severe or worsening symptoms.

## 2016-01-15 NOTE — ED Notes (Signed)
Awake. Verbally responsive. A/O x4. Resp even and unlabored. No audible adventitious breath sounds noted. ABC's intact.  

## 2016-01-15 NOTE — ED Provider Notes (Signed)
CSN: [id number]     Arrival date & time 01/15/16  1258 History   First MD Initiated Contact with Patient 01/15/16 1502     Chief Complaint  Patient presents with  . Vomiting     (Consider location/radiation/quality/duration/timing/severity/associated sxs/prior Treatment) HPI Comments: The pt has hx of GI bleed - upper last year - found to have erosive esophagitis.   The caregiver brings him into the hospital today after witnessing a large amount of coffee-ground emesis that occurred prior to eating breakfast. The patient has no other complaints, he had a similar episode a year ago, had an EGD by gastroenterology showing erosive esophagitis with history of Barrett's esophagus, he was placed on twice a day proton pump inhibitors, review of his medical record shows that his hemoglobin was above 10 at discharge last year. The patient has no other complaints other than some mild upper abdominal discomfort pointing to the left upper quadrant.  The history is provided by the patient, a caregiver and medical records.    Past Medical History  Diagnosis Date  . Schizophrenia (HCC)   . Mental retardation   . Seizure (HCC)   . Barrett esophagus   . Dysphagia   . Iron deficiency anemia   . Hypercholesteremia   . GERD (gastroesophageal reflux disease)   . Gastroparesis    Past Surgical History  Procedure Laterality Date  . Craniotomy for cyst fenestration    . Esophagogastroduodenoscopy N/A 01/09/2015    Procedure: ESOPHAGOGASTRODUODENOSCOPY (EGD);  Surgeon: Louis Meckel, MD;  Location: [facility] ENDOSCOPY;  Service: Endoscopy;  Laterality: N/A;   No family history on file. Social History  Substance Use Topics  . Smoking status: Never Smoker   . Smokeless tobacco: Never Used  . Alcohol Use: No    Review of Systems  All other systems reviewed and are negative.     Allergies  Vancomycin  Home Medications   Prior to Admission medications   Medication Sig Start Date End Date Taking?  Authorizing Provider  aspirin EC 81 MG tablet Take 81 mg by mouth daily as needed. Pain or headache   Yes Historical Provider, MD  Cholecalciferol (VITAMIN D) 2000 units tablet Take 2,000 Units by mouth daily.   Yes Historical Provider, MD  divalproex (DEPAKOTE ER) 500 MG 24 hr tablet Take 500 mg by mouth daily.   Yes Historical Provider, MD  ferrous sulfate 325 (65 FE) MG tablet Take 325 mg by mouth daily with breakfast.   Yes Historical Provider, MD  lamoTRIgine (LAMICTAL) 100 MG tablet Take 100 mg by mouth daily.   Yes Historical Provider, MD  levothyroxine (SYNTHROID, LEVOTHROID) 75 MCG tablet Take 75 mcg by mouth daily.   Yes Historical Provider, MD  lovastatin (MEVACOR) 20 MG tablet Take 20 mg by mouth at bedtime.   Yes Historical Provider, MD  metoCLOPramide (REGLAN) 5 MG tablet Take 1 tablet (5 mg total) by mouth 3 (three) times daily before meals. 07/27/15  Yes Belkys A Regalado, MD  pantoprazole (PROTONIX) 40 MG tablet Take 1 tablet (40 mg total) by mouth 2 (two) times daily. 07/27/15  Yes Belkys A Regalado, MD  propranolol (INDERAL) 10 MG tablet Take 10 mg by mouth 3 (three) times daily.   Yes Historical Provider, MD  QUEtiapine (SEROQUEL) 400 MG tablet Take 400 mg by mouth 2 (two) times daily.    Yes Historical Provider, MD  sucralfate (CARAFATE) 1 g tablet Take 1 tablet (1 g total) by mouth 4 (four) times daily -  with meals and at bedtime. 01/15/16   Eber Hong, MD   BP 113/81 mmHg  Pulse 93  Temp(Src) 97.8 F (36.6 C) (Oral)  Resp 17  SpO2 96% Physical Exam  Constitutional: He appears well-developed and well-nourished. No distress.  HENT:  Head: Normocephalic and atraumatic.  Mouth/Throat: Oropharynx is clear and moist. No oropharyngeal exudate.  Eyes: Conjunctivae and EOM are normal. Pupils are equal, round, and reactive to light. Right eye exhibits no discharge. Left eye exhibits no discharge. No scleral icterus.  Neck: Normal range of motion. Neck supple. No JVD present. No  thyromegaly present.  Cardiovascular: Normal rate, regular rhythm, normal heart sounds and intact distal pulses.  Exam reveals no gallop and no friction rub.   No murmur heard. Pulmonary/Chest: Effort normal and breath sounds normal. No respiratory distress. He has no wheezes. He has no rales.  Abdominal: Soft. Bowel sounds are normal. He exhibits no distension and no mass. There is tenderness ( Minimal upper abdominal tenderness, no guarding or masses).  Musculoskeletal: Normal range of motion. He exhibits no edema or tenderness.  Lymphadenopathy:    He has no cervical adenopathy.  Neurological: He is alert. Coordination normal.  Skin: Skin is warm and dry. No rash noted. No erythema.  Psychiatric: He has a normal mood and affect. His behavior is normal.  Nursing note and vitals reviewed.   ED Course  Procedures (including critical care time) Labs Review Labs Reviewed  CBC WITH DIFFERENTIAL/PLATELET - Abnormal; Notable for the following:    RBC 3.84 (*)    Hemoglobin 11.8 (*)    HCT 35.9 (*)    All other components within normal limits  COMPREHENSIVE METABOLIC PANEL - Abnormal; Notable for the following:    Glucose, Bld 149 (*)    BUN 36 (*)    ALT 14 (*)    All other components within normal limits  RETICULOCYTES - Abnormal; Notable for the following:    Retic Ct Pct 3.4 (*)    RBC. 3.84 (*)    All other components within normal limits  APTT  PROTIME-INR  OCCULT BLOOD GASTRIC / DUODENUM (SPECIMEN CUP)  VITAMIN B12  FOLATE  IRON AND TIBC  FERRITIN  TYPE AND SCREEN    Imaging Review No results found. I have personally reviewed and evaluated these images and lab results as part of my medical decision-making.    MDM   Final diagnoses:  Upper GI bleed    The patient has vital signs significant only for mild tachycardia, the rest of his exam is rather unremarkable. We'll check hemoglobin, with his history of upper GI bleeding related to erosive esophagitis consider  that this is the most likely cause would also consider other sources as the patient is on a baby aspirin this could be peptic ulcer disease as well. We'll need to verify the patient's medication regimen with the caregiver  Pt has appeared stable - hgb well appaering, VS normal - on protonix BID - add back carafate and pepcid, f/u with GI this week - sister is HCPOA and is at bedside - in agtreement.  Meds given in ED:  Medications  sodium chloride 0.9 % bolus 1,000 mL (not administered)  famotidine (PEPCID) IVPB 20 mg premix (not administered)  pantoprazole (PROTONIX) injection 40 mg (not administered)    New Prescriptions   SUCRALFATE (CARAFATE) 1 G TABLET    Take 1 tablet (1 g total) by mouth 4 (four) times daily -  with meals and at bedtime.  Eber Hong, MD 01/15/16 [phone]

## 2016-01-15 NOTE — ED Notes (Addendum)
Awake. Verbally responsive. A/O x4. Resp even and unlabored. No audible adventitious breath sounds noted. ABC's intact. Pt ambulated to BR with steady gait. 

## 2016-01-15 NOTE — ED Notes (Signed)
Pt's caregiver reports pt had one episode of "black" emesis this morning.  Pt denies any abd pain at this time.  Caregiver reports pt has hx of this.

## 2016-01-15 NOTE — ED Notes (Signed)
Pt told ED staff he was leaving to go to another hospital, staff saw him leave the building

## 2016-01-15 NOTE — ED Notes (Signed)
Pt denied having n/v.  NAD noted at this time.

## 2016-01-15 NOTE — ED Notes (Signed)
Awake. Verbally responsive. A/O x4. Resp even and unlabored. No audible adventitious breath sounds noted. ABC's intact. Family at bedside. Plans to have nurse to attempt via IV ultrasounds.

## 2016-03-13 ENCOUNTER — Encounter (HOSPITAL_COMMUNITY): Payer: Self-pay | Admitting: Emergency Medicine

## 2016-03-13 ENCOUNTER — Emergency Department (HOSPITAL_COMMUNITY)
Admission: EM | Admit: 2016-03-13 | Discharge: 2016-03-13 | Disposition: A | Discharge status: Home / Self Care | Payer: Medicare Other | Attending: Emergency Medicine | Admitting: Emergency Medicine

## 2016-03-13 DIAGNOSIS — E7800 Pure hypercholesterolemia, unspecified: Secondary | ICD-10-CM | POA: Diagnosis not present

## 2016-03-13 DIAGNOSIS — D509 Iron deficiency anemia, unspecified: Secondary | ICD-10-CM | POA: Diagnosis not present

## 2016-03-13 DIAGNOSIS — R112 Nausea with vomiting, unspecified: Secondary | ICD-10-CM | POA: Insufficient documentation

## 2016-03-13 DIAGNOSIS — Z79899 Other long term (current) drug therapy: Secondary | ICD-10-CM | POA: Diagnosis not present

## 2016-03-13 DIAGNOSIS — Z7982 Long term (current) use of aspirin: Secondary | ICD-10-CM | POA: Diagnosis not present

## 2016-03-13 DIAGNOSIS — K219 Gastro-esophageal reflux disease without esophagitis: Secondary | ICD-10-CM | POA: Insufficient documentation

## 2016-03-13 DIAGNOSIS — K2270 Barrett's esophagus without dysplasia: Secondary | ICD-10-CM | POA: Diagnosis not present

## 2016-03-13 DIAGNOSIS — F209 Schizophrenia, unspecified: Secondary | ICD-10-CM | POA: Diagnosis not present

## 2016-03-13 LAB — COMPREHENSIVE METABOLIC PANEL
ALBUMIN: 4.4 g/dL (ref 3.5–5.0)
ALT: 12 U/L — ABNORMAL LOW (ref 17–63)
ANION GAP: 11 (ref 5–15)
AST: 19 U/L (ref 15–41)
Alkaline Phosphatase: 57 U/L (ref 38–126)
BUN: 11 mg/dL (ref 6–20)
CHLORIDE: 104 mmol/L (ref 101–111)
CO2: 27 mmol/L (ref 22–32)
Calcium: 9.4 mg/dL (ref 8.9–10.3)
Creatinine, Ser: 1.11 mg/dL (ref 0.61–1.24)
GFR calc non Af Amer: >60 mL/min (ref >60)
GLUCOSE: 110 mg/dL — AB (ref 65–99)
Potassium: 3.8 mmol/L (ref 3.5–5.1)
SODIUM: 142 mmol/L (ref 135–145)
Total Bilirubin: 0.4 mg/dL (ref 0.3–1.2)
Total Protein: 7.6 g/dL (ref 6.5–8.1)

## 2016-03-13 LAB — CBC
HCT: 36.8 % — ABNORMAL LOW (ref 39.0–52.0)
HEMOGLOBIN: 11.3 g/dL — AB (ref 13.0–17.0)
MCH: 26.8 pg (ref 26.0–34.0)
MCHC: 30.7 g/dL (ref 30.0–36.0)
MCV: 87.2 fL (ref 78.0–100.0)
PLATELETS: 280 10*3/uL (ref 150–400)
RBC: 4.22 MIL/uL (ref 4.22–5.81)
RDW: 16.2 % — AB (ref 11.5–15.5)
WBC: 5 10*3/uL (ref 4.0–10.5)

## 2016-03-13 LAB — LIPASE, BLOOD: LIPASE: 26 U/L (ref 11–51)

## 2016-03-13 MED ORDER — ONDANSETRON 4 MG PO TBDP
4.0000 mg | ORAL_TABLET | Freq: Once | ORAL | Status: AC
  Administered 2016-03-13: 4 mg via ORAL
  Filled 2016-03-13: qty 1

## 2016-03-13 MED ORDER — ONDANSETRON HCL 4 MG PO TABS: 4.0000 mg | ORAL_TABLET | Freq: Four times a day (QID) | ORAL | Status: DC

## 2016-03-13 NOTE — Discharge Instructions (Signed)

## 2016-03-13 NOTE — ED Notes (Signed)
Pt BIB group home.  Pt began throwing up today.  States this has happened before because he has "gastro" problems.  Denies pain.

## 2016-03-20 NOTE — ED Provider Notes (Signed)
CSN: [id number]     Arrival date & time 03/13/16  1523 History   First MD Initiated Contact with Patient 03/13/16 2200     Chief Complaint  Patient presents with  . Emesis     (Consider location/radiation/quality/duration/timing/severity/associated sxs/prior Treatment) HPI  60 year old male with nausea and vomiting.onset earlier today.  Currently says that he feels better. Unfortunately had to wait several hours in the emergency room prior to my evaluation. He is coming from a group home and is accompanied by one of his caretakers. They report that he is at his baseline. He has had no further vomiting since being in the emergency room. He denies any abdominal pain to me. No reported fever. No other residents at the facility with vomiting.   Past Medical History  Diagnosis Date  . Schizophrenia (HCC)   . Mental retardation   . Seizure (HCC)   . Barrett esophagus   . Dysphagia   . Iron deficiency anemia   . Hypercholesteremia   . GERD (gastroesophageal reflux disease)   . Gastroparesis    Past Surgical History  Procedure Laterality Date  . Craniotomy for cyst fenestration    . Esophagogastroduodenoscopy N/A 01/09/2015    Procedure: ESOPHAGOGASTRODUODENOSCOPY (EGD);  Surgeon: Louis Meckel, MD;  Location: [facility] ENDOSCOPY;  Service: Endoscopy;  Laterality: N/A;   No family history on file. Social History  Substance Use Topics  . Smoking status: Never Smoker   . Smokeless tobacco: Never Used  . Alcohol Use: No    Review of Systems  All systems reviewed and negative, other than as noted in HPI.   Allergies  Vancomycin  Home Medications   Prior to Admission medications   Medication Sig Start Date End Date Taking? Authorizing Provider  aspirin EC 81 MG tablet Take 81 mg by mouth daily as needed. Pain or headache   Yes Historical Provider, MD  Cholecalciferol (VITAMIN D) 2000 units tablet Take 2,000 Units by mouth daily.   Yes Historical Provider, MD  divalproex (DEPAKOTE  ER) 500 MG 24 hr tablet Take 500 mg by mouth daily.   Yes Historical Provider, MD  Emollient (CORN HUSKERS EX) Apply 1 application topically 2 (two) times daily. Both feet.   Yes Historical Provider, MD  ferrous sulfate 325 (65 FE) MG tablet Take 325 mg by mouth daily with breakfast.   Yes Historical Provider, MD  lamoTRIgine (LAMICTAL) 100 MG tablet Take 100 mg by mouth daily.   Yes Historical Provider, MD  levothyroxine (SYNTHROID, LEVOTHROID) 75 MCG tablet Take 75 mcg by mouth daily.   Yes Historical Provider, MD  lovastatin (MEVACOR) 20 MG tablet Take 20 mg by mouth at bedtime.   Yes Historical Provider, MD  metoCLOPramide (REGLAN) 5 MG tablet Take 1 tablet (5 mg total) by mouth 3 (three) times daily before meals. 07/27/15  Yes Belkys A Regalado, MD  pantoprazole (PROTONIX) 40 MG tablet Take 1 tablet (40 mg total) by mouth 2 (two) times daily. 07/27/15  Yes Belkys A Regalado, MD  propranolol (INDERAL) 20 MG tablet Take 20 mg by mouth 3 (three) times daily.   Yes Historical Provider, MD  QUEtiapine (SEROQUEL) 400 MG tablet Take 400 mg by mouth 2 (two) times daily.    Yes Historical Provider, MD  ranitidine (ZANTAC) 150 MG tablet Take 150 mg by mouth at bedtime.   Yes Historical Provider, MD  sucralfate (CARAFATE) 1 g tablet Take 1 tablet (1 g total) by mouth 4 (four) times daily -  with meals and  at bedtime. 01/15/16  Yes Eber HongBrian Miller, MD  ondansetron (ZOFRAN) 4 MG tablet Take 1 tablet (4 mg total) by mouth every 6 (six) hours. 03/13/16   Raeford RazorStephen Byanca Kasper, MD   BP 150/100 mmHg  Pulse 101  Temp(Src) 99.2 F (37.3 C) (Oral)  Resp 18  SpO2 100% Physical Exam  Constitutional: He appears well-developed and well-nourished. No distress.  HENT:  Head: Normocephalic and atraumatic.  Eyes: Conjunctivae are normal. Right eye exhibits no discharge. Left eye exhibits no discharge.  Neck: Neck supple.  Cardiovascular: Normal rate, regular rhythm and normal heart sounds.  Exam reveals no gallop and no friction  rub.   No murmur heard. Pulmonary/Chest: Effort normal and breath sounds normal. No respiratory distress.  Abdominal: Soft. He exhibits no distension. There is no tenderness.  Musculoskeletal: He exhibits no edema or tenderness.  Neurological: He is alert.  Skin: Skin is warm and dry.  Psychiatric: He has a normal mood and affect. His behavior is normal. Thought content normal.  Nursing note and vitals reviewed.   ED Course  Procedures (including critical care time) Labs Review Labs Reviewed  COMPREHENSIVE METABOLIC PANEL - Abnormal; Notable for the following:    Glucose, Bld 110 (*)    ALT 12 (*)    All other components within normal limits  CBC - Abnormal; Notable for the following:    Hemoglobin 11.3 (*)    HCT 36.8 (*)    RDW 16.2 (*)    All other components within normal limits  LIPASE, BLOOD    Imaging Review No results found. I have personally reviewed and evaluated these images and lab results as part of my medical decision-making.   EKG Interpretation None      MDM   Final diagnoses:  Non-intractable vomiting with nausea, vomiting of unspecified type    60 year old male brought in for evaluation of nausea and vomiting.Currently resolved.Abdominal exam is benign.Basic labs reviewed and fairly unrevealing. A low suspicion for emergent process.Discharged with prescription for as needed Zofran. Return precautions were discussed with caretaker.    Raeford RazorStephen Genessis Flanary, MD 03/20/16 1114

## 2018-04-07 ENCOUNTER — Telehealth: Payer: Self-pay | Admitting: Gastroenterology

## 2019-02-09 ENCOUNTER — Emergency Department (HOSPITAL_COMMUNITY)
Admission: EM | Admit: 2019-02-09 | Discharge: 2019-02-09 | Disposition: A | Discharge status: Home / Self Care | Payer: Medicare Other | Attending: Emergency Medicine | Admitting: Emergency Medicine

## 2019-02-09 ENCOUNTER — Encounter (HOSPITAL_COMMUNITY): Payer: Self-pay

## 2019-02-09 ENCOUNTER — Emergency Department (HOSPITAL_COMMUNITY): Payer: Medicare Other

## 2019-02-09 ENCOUNTER — Other Ambulatory Visit: Payer: Self-pay

## 2019-02-09 DIAGNOSIS — B349 Viral infection, unspecified: Secondary | ICD-10-CM | POA: Diagnosis not present

## 2019-02-09 DIAGNOSIS — Z7982 Long term (current) use of aspirin: Secondary | ICD-10-CM | POA: Insufficient documentation

## 2019-02-09 DIAGNOSIS — Z79899 Other long term (current) drug therapy: Secondary | ICD-10-CM | POA: Diagnosis not present

## 2019-02-09 DIAGNOSIS — R509 Fever, unspecified: Secondary | ICD-10-CM | POA: Diagnosis present

## 2019-02-09 LAB — URINALYSIS, ROUTINE W REFLEX MICROSCOPIC
Bilirubin Urine: NEGATIVE
Glucose, UA: NEGATIVE mg/dL
HGB URINE DIPSTICK: NEGATIVE
KETONES UR: NEGATIVE mg/dL
Leukocytes,Ua: NEGATIVE
Nitrite: NEGATIVE
PROTEIN: NEGATIVE mg/dL
Specific Gravity, Urine: 1.014 (ref 1.005–1.030)
pH: 6 (ref 5.0–8.0)

## 2019-02-09 LAB — CBC WITH DIFFERENTIAL/PLATELET
Abs Immature Granulocytes: 0.01 10*3/uL (ref 0.00–0.07)
Basophils Absolute: 0 10*3/uL (ref 0.0–0.1)
Basophils Relative: 1 %
Eosinophils Absolute: 0 10*3/uL (ref 0.0–0.5)
Eosinophils Relative: 1 %
HCT: 43.3 % (ref 39.0–52.0)
HEMOGLOBIN: 13.8 g/dL (ref 13.0–17.0)
Immature Granulocytes: 0 %
Lymphocytes Relative: 44 %
Lymphs Abs: 1.8 10*3/uL (ref 0.7–4.0)
MCH: 31.2 pg (ref 26.0–34.0)
MCHC: 31.9 g/dL (ref 30.0–36.0)
MCV: 98 fL (ref 80.0–100.0)
Monocytes Absolute: 0.4 10*3/uL (ref 0.1–1.0)
Monocytes Relative: 11 %
NEUTROS ABS: 1.8 10*3/uL (ref 1.7–7.7)
Neutrophils Relative %: 43 %
Platelets: 180 10*3/uL (ref 150–400)
RBC: 4.42 MIL/uL (ref 4.22–5.81)
RDW: 11.7 % (ref 11.5–15.5)
WBC: 4.1 10*3/uL (ref 4.0–10.5)
nRBC: 0 % (ref 0.0–0.2)

## 2019-02-09 LAB — INFLUENZA PANEL BY PCR (TYPE A & B)
INFLBPCR: NEGATIVE
Influenza A By PCR: NEGATIVE

## 2019-02-09 LAB — COMPREHENSIVE METABOLIC PANEL
ALK PHOS: 73 U/L (ref 38–126)
ALT: 15 U/L (ref 0–44)
ANION GAP: 9 (ref 5–15)
AST: 19 U/L (ref 15–41)
Albumin: 4.1 g/dL (ref 3.5–5.0)
BUN: 15 mg/dL (ref 8–23)
CALCIUM: 8.9 mg/dL (ref 8.9–10.3)
CO2: 26 mmol/L (ref 22–32)
Chloride: 104 mmol/L (ref 98–111)
Creatinine, Ser: 1.3 mg/dL — ABNORMAL HIGH (ref 0.61–1.24)
GFR calc non Af Amer: 59 mL/min — ABNORMAL LOW (ref >60)
Glucose, Bld: 119 mg/dL — ABNORMAL HIGH (ref 70–99)
Potassium: 4.3 mmol/L (ref 3.5–5.1)
SODIUM: 139 mmol/L (ref 135–145)
Total Bilirubin: 0.6 mg/dL (ref 0.3–1.2)
Total Protein: 7.3 g/dL (ref 6.5–8.1)

## 2019-02-09 NOTE — ED Triage Notes (Signed)
Pt coming from group home. Per caregiver, nurse came and saw pt and said he had flu like symptoms. Pt denies at this time. Caregiver states pt was gagging and was just not acting himself.

## 2019-02-09 NOTE — ED Provider Notes (Signed)
[facility]-EMERGENCY DEPT Provider Note   CSN: [id number] Arrival date & time: 02/09/19  1231     History   Chief Complaint Chief Complaint  Patient presents with  . URI    HPI [name] is a 63 y.o. male.  [name] is a 63 y.o. male with a history of MR, schizophrenia, seizures, hypercholesterolemia, GERD, gastroparesis and Barrett's esophagus, who presents to the emergency department from group home for evaluation of flulike symptoms.  Yesterday and today staff noted that patient seemed to be "not acting like himself".  He had one episode of spitting up, but does have a significant history of reflux and this is not necessarily uncommon for him, nurse evaluated patient today and he was noted to have a fever of 102, was given Tylenol and this resolved upon arrival to the emergency department.  Patient denies any focal pain or symptoms currently, no chest pain or shortness of breath, no abdominal pain, nausea, vomiting or diarrhea.  Staff notes that the patient has had an intermittent cough and some rhinorrhea.  Staff concern for possible influenza or other viral syndrome as patient that lives in the room next door developed similar symptoms and is here for evaluation as well.     Past Medical History:  Diagnosis Date  . Barrett esophagus   . Dysphagia   . Gastroparesis   . GERD (gastroesophageal reflux disease)   . Hypercholesteremia   . Iron deficiency anemia   . Mental retardation   . Schizophrenia (HCC)   . Seizure [facility](HCC)     Patient Active Problem List   Diagnosis Date Noted  . Emesis 07/24/2015  . Coffee ground emesis   . Gastrointestinal hemorrhage associated with other gastritis   . Acute esophagitis 01/09/2015  . Upper gastrointestinal bleed   . Diarrhea   . Nausea with vomiting   . GI bleed 01/08/2015  . GIB (gastrointestinal bleeding) 01/08/2015  . Nausea & vomiting 01/08/2015  . Reflux esophagitis 07/04/2009  .  Loss of weight 07/04/2009  . BLOOD IN STOOL, OCCULT 07/04/2009  . ANEMIA, IRON DEFICIENCY 05/04/2008  . Hypothyroidism 04/05/2008  . Schizophrenia, unspecified type (HCC) 04/05/2008  . Depression 04/05/2008  . HEMIPARESIS, LEFT 04/05/2008  . Essential hypertension 04/05/2008  . GERD 04/05/2008  . Gastroparesis 04/05/2008  . Seizure (HCC) 04/05/2008  . History of diverticulosis 08/21/2004  . Barrett's esophagus 04/01/2002  . HIATAL HERNIA 04/01/2002    Past Surgical History:  Procedure Laterality Date  . CRANIOTOMY FOR CYST FENESTRATION    . ESOPHAGOGASTRODUODENOSCOPY N/A 01/09/2015   Procedure: ESOPHAGOGASTRODUODENOSCOPY (EGD);  Surgeon: Louis Meckelobert D Kaplan, MD;  [facility];  Service: Endoscopy;  Laterality: N/A;        Home Medications    Prior to Admission medications   Medication Sig Start Date End Date Taking? Authorizing Provider  aspirin EC 81 MG tablet Take 81 mg by mouth daily as needed. Pain or headache    [provider]  Cholecalciferol (VITAMIN D) 2000 units tablet Take 2,000 Units by mouth daily.    [provider]  divalproex (DEPAKOTE ER) 500 MG 24 hr tablet Take 500 mg by mouth daily.    [provider]  Emollient (CORN HUSKERS EX) Apply 1 application topically 2 (two) times daily. Both feet.    [provider]  ferrous sulfate 325 (65 FE) MG tablet Take 325 mg by mouth daily with breakfast.    [provider]  lamoTRIgine (LAMICTAL) 100 MG  tablet Take 100 mg by mouth daily.    [provider]  levothyroxine (SYNTHROID, LEVOTHROID) 75 MCG tablet Take 75 mcg by mouth daily.    [provider]  lovastatin (MEVACOR) 20 MG tablet Take 20 mg by mouth at bedtime.    [provider]  metoCLOPramide (REGLAN) 5 MG tablet Take 1 tablet (5 mg total) by mouth 3 (three) times daily before meals. 07/27/15   Regalado, Belkys A, MD  ondansetron (ZOFRAN) 4 MG tablet Take 1 tablet (4 mg total) by mouth  every 6 (six) hours. 03/13/16   Raeford Razor, MD  pantoprazole (PROTONIX) 40 MG tablet Take 1 tablet (40 mg total) by mouth 2 (two) times daily. 07/27/15   Regalado, Belkys A, MD  propranolol (INDERAL) 20 MG tablet Take 20 mg by mouth 3 (three) times daily.    [provider]  QUEtiapine (SEROQUEL) 400 MG tablet Take 400 mg by mouth 2 (two) times daily.     [provider]  ranitidine (ZANTAC) 150 MG tablet Take 150 mg by mouth at bedtime.    [provider]  sucralfate (CARAFATE) 1 g tablet Take 1 tablet (1 g total) by mouth 4 (four) times daily -  with meals and at bedtime. 01/15/16   Eber Hong, MD    Family History No family history on file.  Social History Social History   Tobacco Use  . Smoking status: Never Smoker  . Smokeless tobacco: Never Used  Substance Use Topics  . Alcohol use: No  . Drug use: No     Allergies   Vancomycin   Review of Systems Review of Systems  Constitutional: Positive for chills and fever.  HENT: Positive for congestion and rhinorrhea.   Eyes: Negative for visual disturbance.  Respiratory: Positive for cough. Negative for shortness of breath.   Cardiovascular: Negative for chest pain.  Gastrointestinal: Positive for nausea. Negative for abdominal pain, blood in stool, constipation, diarrhea and vomiting.  Genitourinary: Negative for dysuria and frequency.  Musculoskeletal: Negative for arthralgias and myalgias.  Skin: Negative for color change and rash.  Neurological: Negative for headaches.  All other systems reviewed and are negative.    Physical Exam Updated Vital Signs BP (!) 131/91 (BP Location: Left Arm)   Pulse 71   Temp 97.6 F (36.4 C) (Oral)   Resp 16   Wt 70.2 kg   SpO2 99%   BMI 27.42 kg/m   Physical Exam Vitals signs and nursing note reviewed.  Constitutional:      General: He is not in acute distress.    Appearance: Normal appearance. He is well-developed and normal weight. He is not  ill-appearing, toxic-appearing or diaphoretic.  HENT:     Head: Normocephalic and atraumatic.     Right Ear: Tympanic membrane and ear canal normal.     Left Ear: Tympanic membrane and ear canal normal.     Nose: Congestion and rhinorrhea present.     Comments: Bilateral nares patent with moderate mucosal edema and clear rhinorrhea present.     Mouth/Throat:     Mouth: Mucous membranes are moist.     Pharynx: Oropharynx is clear. Posterior oropharyngeal erythema present.     Comments: Posterior oropharynx clear and mucous membranes moist, there is mild erythema but no edema or tonsillar exudates, uvula midline, normal phonation, no trismus, tolerating secretions without difficulty. Eyes:     General:        Right eye: No discharge.  Left eye: No discharge.     Pupils: Pupils are equal, round, and reactive to light.  Neck:     Musculoskeletal: Neck supple.  Cardiovascular:     Rate and Rhythm: Normal rate and regular rhythm.     Pulses: Normal pulses.     Heart sounds: Normal heart sounds. No murmur. No friction rub. No gallop.   Pulmonary:     Effort: Pulmonary effort is normal. No respiratory distress.     Breath sounds: Normal breath sounds. No wheezing or rales.     Comments: Respirations equal and unlabored, patient able to speak in full sentences, lungs clear to auscultation bilaterally Abdominal:     General: Abdomen is flat. Bowel sounds are normal. There is no distension.     Palpations: Abdomen is soft. There is no mass.     Tenderness: There is no abdominal tenderness. There is no guarding.     Comments: Abdomen soft, nondistended, nontender to palpation in all quadrants without guarding or peritoneal signs  Musculoskeletal:        General: No deformity.  Skin:    General: Skin is warm and dry.     Capillary Refill: Capillary refill takes less than 2 seconds.  Neurological:     Mental Status: He is alert.     Coordination: Coordination normal.     Comments:  Speech is clear, able to follow commands Moves extremities without ataxia, coordination intact  Psychiatric:        Mood and Affect: Mood normal.        Behavior: Behavior normal.      ED Treatments / Results  Labs (all labs ordered are listed, but only abnormal results are displayed) Labs Reviewed  COMPREHENSIVE METABOLIC PANEL - Abnormal; Notable for the following components:      Result Value   Glucose, Bld 119 (*)    Creatinine, Ser 1.30 (*)    GFR calc non Af Amer 59 (*)    All other components within normal limits  INFLUENZA PANEL BY PCR (TYPE A & B)  URINALYSIS, ROUTINE W REFLEX MICROSCOPIC  CBC WITH DIFFERENTIAL/PLATELET  CBC WITH DIFFERENTIAL/PLATELET    EKG None  Radiology Dg Chest 2 View  Result Date: 02/09/2019 CLINICAL DATA:  Cough EXAM: CHEST - 2 VIEW COMPARISON:  April 22, 2018 chest radiograph and CT abdomen and pelvis including lung bases March 25, 2018 FINDINGS: There is no appreciable edema or consolidation. The heart size and pulmonary vascularity are normal. No adenopathy. No bone lesions evident. There is a focal hiatal hernia. IMPRESSION: No edema or consolidation.  Hiatal hernia present. Electronically Signed   By: Bretta BangWilliam  Woodruff III M.D.   On: 02/09/2019 14:02    Procedures Procedures (including critical care time)  Medications Ordered in ED Medications - No data to display   Initial Impression / Assessment and Plan / ED Course  I have reviewed the triage vital signs and the nursing notes.  Pertinent labs & imaging results that were available during my care of the patient were reviewed by me and considered in my medical decision making (see chart for details).  Patient presents from nursing facility for evaluation of flulike symptoms, yesterday and today was less talkative than usual and less active and today when nurse came to evaluate patient he was noted to have a fever, this resolved with Tylenol prior to arrival.  He has had an occasional  cough, some congestion and had an episode of reflux which is not atypical for  him.  Today he denies any complaints or pain, facility staff that he rarely complains of anything.  His lungs are clear heart with regular rate and rhythm and abdominal exam is benign and he is very well-appearing.  Suspect viral syndrome, patient with normal vitals, but given the patient does have MR and schizophrenia making history limited will check basic labs, chest x-ray and flu panel.  Labs very reassuring, no leukocytosis, normal hemoglobin, no acute electrolyte derangements, creatinine is slightly elevated today at 1.30, baseline of 1.17, patient is tolerating p.o. fluids without difficulty so will have staff encourage better hydration and have this rechecked with patient's primary care doctor in a week rather than giving an IV which could be traumatic for the patient.  Urinalysis with no signs of infection.  Flu test is negative.  Per staff patient seems to be acting more like himself now that fever has resolved.  I suspect viral syndrome as explanation for patient's symptoms especially with another resident from the same group home here with a very similar presentation.  Feel that patient is stable for discharge back to group home will have the monitor closely for any worsening in symptoms.  PCP follow-up encouraged and return precautions discussed.  Patient and group home staff expressed understanding and are in agreement with plan.  Discharged home in good condition.  Final Clinical Impressions(s) / ED Diagnoses   Final diagnoses:  Viral syndrome    ED Discharge Orders    None       Legrand Rams 02/09/19 1557    Bethann Berkshire, MD 02/10/19 1232

## 2019-02-09 NOTE — ED Notes (Signed)
Pt brought in by group home caregiver.  She reports pt was not "acting like himself" this am.  Endorses fever this am as well.  He was given Tylenol.  Pt is A&Ox 4.  In NAD.

## 2019-02-09 NOTE — Discharge Instructions (Signed)
Suspect patient has viral illness, his evaluation today is very reassuring, flu test is negative, chest x-ray shows no evidence of pneumonia and labs overall look good.  His creatinine is slightly elevated from baseline, please make sure he is drinking plenty of fluids by mouth, monitor symptoms closely if he has persistent fevers, complains of abdominal pain, worsening cough, headaches or you notice any other new or concerning symptoms follow-up with PCP or return to the emergency department for reevaluation.

## 2020-05-05 ENCOUNTER — Ambulatory Visit
Admission: RE | Admit: 2020-05-05 | Discharge: 2020-05-05 | Disposition: A | Discharge status: Home / Self Care | Payer: Medicare Other | Source: Ambulatory Visit | Attending: Family Medicine | Admitting: Family Medicine

## 2020-05-05 ENCOUNTER — Other Ambulatory Visit: Payer: Self-pay | Admitting: Family Medicine

## 2020-05-05 DIAGNOSIS — R269 Unspecified abnormalities of gait and mobility: Secondary | ICD-10-CM

## 2020-09-05 ENCOUNTER — Encounter (HOSPITAL_COMMUNITY): Payer: Self-pay | Admitting: Emergency Medicine

## 2020-09-05 ENCOUNTER — Emergency Department (HOSPITAL_COMMUNITY): Payer: Medicare Other

## 2020-09-05 ENCOUNTER — Inpatient Hospital Stay (HOSPITAL_COMMUNITY)
Admission: EM | Admit: 2020-09-05 | Discharge: 2020-09-06 | DRG: 101 | Disposition: A | Discharge status: Home / Self Care | Payer: Medicare Other | Attending: Family Medicine | Admitting: Family Medicine

## 2020-09-05 DIAGNOSIS — R778 Other specified abnormalities of plasma proteins: Secondary | ICD-10-CM | POA: Diagnosis not present

## 2020-09-05 DIAGNOSIS — K2270 Barrett's esophagus without dysplasia: Secondary | ICD-10-CM | POA: Diagnosis not present

## 2020-09-05 DIAGNOSIS — F209 Schizophrenia, unspecified: Secondary | ICD-10-CM | POA: Diagnosis present

## 2020-09-05 DIAGNOSIS — Z79899 Other long term (current) drug therapy: Secondary | ICD-10-CM | POA: Diagnosis not present

## 2020-09-05 DIAGNOSIS — E7800 Pure hypercholesterolemia, unspecified: Secondary | ICD-10-CM | POA: Diagnosis present

## 2020-09-05 DIAGNOSIS — I10 Essential (primary) hypertension: Secondary | ICD-10-CM | POA: Diagnosis present

## 2020-09-05 DIAGNOSIS — Z881 Allergy status to other antibiotic agents status: Secondary | ICD-10-CM | POA: Diagnosis not present

## 2020-09-05 DIAGNOSIS — Z79890 Hormone replacement therapy (postmenopausal): Secondary | ICD-10-CM

## 2020-09-05 DIAGNOSIS — G40909 Epilepsy, unspecified, not intractable, without status epilepticus: Secondary | ICD-10-CM | POA: Diagnosis not present

## 2020-09-05 DIAGNOSIS — N179 Acute kidney failure, unspecified: Secondary | ICD-10-CM | POA: Diagnosis not present

## 2020-09-05 DIAGNOSIS — R6521 Severe sepsis with septic shock: Secondary | ICD-10-CM

## 2020-09-05 DIAGNOSIS — Z20822 Contact with and (suspected) exposure to covid-19: Secondary | ICD-10-CM | POA: Diagnosis not present

## 2020-09-05 DIAGNOSIS — D649 Anemia, unspecified: Secondary | ICD-10-CM | POA: Diagnosis not present

## 2020-09-05 DIAGNOSIS — A419 Sepsis, unspecified organism: Secondary | ICD-10-CM

## 2020-09-05 DIAGNOSIS — I69354 Hemiplegia and hemiparesis following cerebral infarction affecting left non-dominant side: Secondary | ICD-10-CM | POA: Diagnosis not present

## 2020-09-05 DIAGNOSIS — D509 Iron deficiency anemia, unspecified: Secondary | ICD-10-CM | POA: Diagnosis present

## 2020-09-05 DIAGNOSIS — E669 Obesity, unspecified: Secondary | ICD-10-CM | POA: Diagnosis not present

## 2020-09-05 DIAGNOSIS — E039 Hypothyroidism, unspecified: Secondary | ICD-10-CM | POA: Diagnosis not present

## 2020-09-05 DIAGNOSIS — F79 Unspecified intellectual disabilities: Secondary | ICD-10-CM | POA: Diagnosis not present

## 2020-09-05 DIAGNOSIS — K449 Diaphragmatic hernia without obstruction or gangrene: Secondary | ICD-10-CM | POA: Diagnosis not present

## 2020-09-05 DIAGNOSIS — K2100 Gastro-esophageal reflux disease with esophagitis, without bleeding: Secondary | ICD-10-CM | POA: Diagnosis present

## 2020-09-05 DIAGNOSIS — R4182 Altered mental status, unspecified: Secondary | ICD-10-CM

## 2020-09-05 LAB — COMPREHENSIVE METABOLIC PANEL
ALT: 14 U/L (ref 0–44)
AST: 15 U/L (ref 15–41)
Albumin: 2.8 g/dL — ABNORMAL LOW (ref 3.5–5.0)
Alkaline Phosphatase: 53 U/L (ref 38–126)
Anion gap: 9 (ref 5–15)
BUN: 31 mg/dL — ABNORMAL HIGH (ref 8–23)
CO2: 23 mmol/L (ref 22–32)
Calcium: 7.4 mg/dL — ABNORMAL LOW (ref 8.9–10.3)
Chloride: 110 mmol/L (ref 98–111)
Creatinine, Ser: 2.14 mg/dL — ABNORMAL HIGH (ref 0.61–1.24)
GFR calc Af Amer: 37 mL/min — ABNORMAL LOW (ref >60)
GFR calc non Af Amer: 32 mL/min — ABNORMAL LOW (ref >60)
Glucose, Bld: 115 mg/dL — ABNORMAL HIGH (ref 70–99)
Potassium: 4.9 mmol/L (ref 3.5–5.1)
Sodium: 142 mmol/L (ref 135–145)
Total Bilirubin: 0.6 mg/dL (ref 0.3–1.2)
Total Protein: 5.4 g/dL — ABNORMAL LOW (ref 6.5–8.1)

## 2020-09-05 LAB — CBC WITH DIFFERENTIAL/PLATELET
Abs Immature Granulocytes: 0.03 10*3/uL (ref 0.00–0.07)
Basophils Absolute: 0 10*3/uL (ref 0.0–0.1)
Basophils Relative: 0 %
Eosinophils Absolute: 0 10*3/uL (ref 0.0–0.5)
Eosinophils Relative: 0 %
HCT: 33.9 % — ABNORMAL LOW (ref 39.0–52.0)
Hemoglobin: 10.2 g/dL — ABNORMAL LOW (ref 13.0–17.0)
Immature Granulocytes: 0 %
Lymphocytes Relative: 14 %
Lymphs Abs: 1.1 10*3/uL (ref 0.7–4.0)
MCH: 27.9 pg (ref 26.0–34.0)
MCHC: 30.1 g/dL (ref 30.0–36.0)
MCV: 92.9 fL (ref 80.0–100.0)
Monocytes Absolute: 1 10*3/uL (ref 0.1–1.0)
Monocytes Relative: 12 %
Neutro Abs: 5.7 10*3/uL (ref 1.7–7.7)
Neutrophils Relative %: 74 %
Platelets: 199 10*3/uL (ref 150–400)
RBC: 3.65 MIL/uL — ABNORMAL LOW (ref 4.22–5.81)
RDW: 15.6 % — ABNORMAL HIGH (ref 11.5–15.5)
WBC: 7.8 10*3/uL (ref 4.0–10.5)
nRBC: 0 % (ref 0.0–0.2)

## 2020-09-05 LAB — URINALYSIS, ROUTINE W REFLEX MICROSCOPIC
Bilirubin Urine: NEGATIVE
Glucose, UA: NEGATIVE mg/dL
Hgb urine dipstick: NEGATIVE
Ketones, ur: NEGATIVE mg/dL
Leukocytes,Ua: NEGATIVE
Nitrite: NEGATIVE
Protein, ur: NEGATIVE mg/dL
Specific Gravity, Urine: 1.016 (ref 1.005–1.030)
pH: 5 (ref 5.0–8.0)

## 2020-09-05 LAB — I-STAT CHEM 8, ED
BUN: 36 mg/dL — ABNORMAL HIGH (ref 8–23)
Calcium, Ion: 1.13 mmol/L — ABNORMAL LOW (ref 1.15–1.40)
Chloride: 109 mmol/L (ref 98–111)
Creatinine, Ser: 2.3 mg/dL — ABNORMAL HIGH (ref 0.61–1.24)
Glucose, Bld: 105 mg/dL — ABNORMAL HIGH (ref 70–99)
HCT: 30 % — ABNORMAL LOW (ref 39.0–52.0)
Hemoglobin: 10.2 g/dL — ABNORMAL LOW (ref 13.0–17.0)
Potassium: 4.9 mmol/L (ref 3.5–5.1)
Sodium: 143 mmol/L (ref 135–145)
TCO2: 23 mmol/L (ref 22–32)

## 2020-09-05 LAB — CBG MONITORING, ED: Glucose-Capillary: 96 mg/dL (ref 70–99)

## 2020-09-05 LAB — TROPONIN I (HIGH SENSITIVITY)
Troponin I (High Sensitivity): 23 ng/L — ABNORMAL HIGH (ref <18)
Troponin I (High Sensitivity): 13 ng/L (ref <18)

## 2020-09-05 LAB — VALPROIC ACID LEVEL: Valproic Acid Lvl: 37 ug/mL — ABNORMAL LOW (ref 50.0–100.0)

## 2020-09-05 LAB — LACTIC ACID, PLASMA
Lactic Acid, Venous: 3.4 mmol/L (ref 0.5–1.9)
Lactic Acid, Venous: 1.6 mmol/L (ref 0.5–1.9)

## 2020-09-05 LAB — APTT: aPTT: 27 seconds (ref 24–36)

## 2020-09-05 LAB — SARS CORONAVIRUS 2 BY RT PCR (HOSPITAL ORDER, PERFORMED IN ~~LOC~~ HOSPITAL LAB): SARS Coronavirus 2: NEGATIVE

## 2020-09-05 LAB — PROTIME-INR
INR: 1.2 (ref 0.8–1.2)
Prothrombin Time: 14.9 seconds (ref 11.4–15.2)

## 2020-09-05 MED ORDER — LINEZOLID 600 MG/300ML IV SOLN
600.0000 mg | Freq: Once | INTRAVENOUS | Status: AC
  Administered 2020-09-05: 600 mg via INTRAVENOUS
  Filled 2020-09-05: qty 300

## 2020-09-05 MED ORDER — ENOXAPARIN SODIUM 30 MG/0.3ML ~~LOC~~ SOLN
30.0000 mg | SUBCUTANEOUS | Status: DC
  Administered 2020-09-05: 30 mg via SUBCUTANEOUS
  Filled 2020-09-05: qty 0.3

## 2020-09-05 MED ORDER — VALPROATE SODIUM 500 MG/5ML IV SOLN
500.0000 mg | Freq: Once | INTRAVENOUS | Status: AC
  Administered 2020-09-05: 500 mg via INTRAVENOUS
  Filled 2020-09-05: qty 5

## 2020-09-05 MED ORDER — LAMOTRIGINE 100 MG PO TABS
100.0000 mg | ORAL_TABLET | Freq: Every day | ORAL | Status: DC
  Administered 2020-09-06: 100 mg via ORAL
  Filled 2020-09-05: qty 1

## 2020-09-05 MED ORDER — POLYETHYLENE GLYCOL 3350 17 G PO PACK: 17.0000 g | PACK | Freq: Every day | ORAL | Status: DC | PRN

## 2020-09-05 MED ORDER — SODIUM CHLORIDE 0.9 % IV SOLN
2.0000 g | Freq: Two times a day (BID) | INTRAVENOUS | Status: DC
  Administered 2020-09-05: 2 g via INTRAVENOUS
  Filled 2020-09-05 (×3): qty 2

## 2020-09-05 MED ORDER — METOCLOPRAMIDE HCL 5 MG PO TABS
5.0000 mg | ORAL_TABLET | Freq: Three times a day (TID) | ORAL | Status: DC
  Administered 2020-09-05 – 2020-09-06 (×4): 5 mg via ORAL
  Filled 2020-09-05 (×4): qty 1

## 2020-09-05 MED ORDER — PANTOPRAZOLE SODIUM 40 MG PO TBEC
40.0000 mg | DELAYED_RELEASE_TABLET | Freq: Every day | ORAL | Status: DC
  Administered 2020-09-06: 40 mg via ORAL
  Filled 2020-09-05: qty 1

## 2020-09-05 MED ORDER — LACTATED RINGERS IV BOLUS (SEPSIS)
500.0000 mL | Freq: Once | INTRAVENOUS | Status: DC
  Administered 2020-09-05: 500 mL via INTRAVENOUS

## 2020-09-05 MED ORDER — ACETAMINOPHEN 325 MG PO TABS: 650.0000 mg | ORAL_TABLET | Freq: Four times a day (QID) | ORAL | Status: DC | PRN

## 2020-09-05 MED ORDER — LEVOTHYROXINE SODIUM 75 MCG PO TABS
75.0000 ug | ORAL_TABLET | Freq: Every day | ORAL | Status: DC
  Administered 2020-09-06: 75 ug via ORAL
  Filled 2020-09-05: qty 1

## 2020-09-05 MED ORDER — SODIUM CHLORIDE 0.9 % IV SOLN
2.0000 g | Freq: Once | INTRAVENOUS | Status: AC
  Administered 2020-09-05: 2 g via INTRAVENOUS
  Filled 2020-09-05: qty 2

## 2020-09-05 MED ORDER — LACTATED RINGERS IV BOLUS (SEPSIS)
1000.0000 mL | Freq: Once | INTRAVENOUS | Status: AC
  Administered 2020-09-05: 1000 mL via INTRAVENOUS

## 2020-09-05 MED ORDER — QUETIAPINE FUMARATE 100 MG PO TABS
400.0000 mg | ORAL_TABLET | Freq: Two times a day (BID) | ORAL | Status: DC
  Administered 2020-09-05 – 2020-09-06 (×2): 400 mg via ORAL
  Filled 2020-09-05: qty 1
  Filled 2020-09-05: qty 4

## 2020-09-05 MED ORDER — LACTATED RINGERS IV SOLN: INTRAVENOUS | Status: DC

## 2020-09-05 MED ORDER — ROSUVASTATIN CALCIUM 20 MG PO TABS
20.0000 mg | ORAL_TABLET | Freq: Every day | ORAL | Status: DC
  Administered 2020-09-06: 20 mg via ORAL
  Filled 2020-09-05: qty 1

## 2020-09-05 MED ORDER — DIVALPROEX SODIUM ER 500 MG PO TB24
500.0000 mg | ORAL_TABLET | Freq: Every day | ORAL | Status: DC
  Administered 2020-09-05 – 2020-09-06 (×2): 500 mg via ORAL
  Filled 2020-09-05 (×2): qty 1

## 2020-09-05 MED ORDER — ACETAMINOPHEN 650 MG RE SUPP: 650.0000 mg | Freq: Four times a day (QID) | RECTAL | Status: DC | PRN

## 2020-09-05 MED ORDER — METRONIDAZOLE IN NACL 5-0.79 MG/ML-% IV SOLN
500.0000 mg | Freq: Once | INTRAVENOUS | Status: AC
  Administered 2020-09-05: 500 mg via INTRAVENOUS
  Filled 2020-09-05: qty 100

## 2020-09-05 NOTE — ED Notes (Addendum)
Please call Pt's sister who is also Pt's POA for any concerns . [name] [phone]

## 2020-09-05 NOTE — Progress Notes (Signed)
Pharmacy Antibiotic Note  LAVANTE TOSO is a 64 y.o. male admitted on 09/05/2020 with AMS and hypotension.  Pharmacy has been consulted for cefepime dosing. Pt is afebrile and WBC is WNL. Scr is elevated above baseline at 2.14.   Plan: Cefepime 2gm IV Q12H F/u renal fxn, C&S, clinical status    Temp (24hrs), Avg:96.9 F (36.1 C), Min:96.9 F (36.1 C), Max:96.9 F (36.1 C)  Recent Labs  Lab 09/05/20 1130 09/05/20 1152  WBC 7.8  --   CREATININE 2.14* 2.30*    CrCl cannot be calculated (Unknown ideal weight.).    Allergies  Allergen Reactions  . Vancomycin Other (See Comments)    Unknown-possible hives     Antimicrobials this admission: Cefepime 9/13>> Flagyl x 1 9/13 Linezolid x 1 9/13   Dose adjustments this admission: N/A  Microbiology results: Pending  Thank you for allowing pharmacy to be a part of this patient's care.  Little Bashore, Drake Leach 09/05/2020 11:22 AM

## 2020-09-05 NOTE — H&P (Addendum)
Family Medicine Teaching [facility] Admission History and Physical Service Pager: [phone]  Patient name: [name] Medical record number: [id number] Date of birth: 10/12/1956 Age: 64 y.o. Gender: male  Primary Care Provider: [facility] Consultants: None Code Status: FULL which was confirmed with sister/POA Preferred Emergency Contact: [name], sister, [facility] POA [phone]  Chief Complaint: Altered Mental Status  Assessment and Plan: [name] is a 64 y.o. male presenting with altered mental status. PMH is significant for intellectual disability, schizophrenia, seizures, hypothyroidism, iron deficiency anemia, GERD and Barrett's esophagus.  Altered Mental Status, Concern for Sepsis Patient presents from group home with altered mental status. Was at baseline this morning at 7am, but by 10am was found to be lethargic with decreased responsiveness. Currently stable with most recent vitals within normal limits (temp 98.3, HR 89, BP 109/56, RR 22). On presentation to the ED, patient initially was hypotensive to 81/55 (MAP 64) and hypothermic to 96.9, so sepsis protocol was initiated. Workup in the ED revealed lactic acid elevated at 3.4, WBC normal at 7.8, Hgb 10.2, CMP Cr 2.3, otherwise normal, CXR negative, UA normal, CT abdomen/pelvis unremarkable other than hiatal hernia. Blood cx obtained and patient given Linezolid, Flagyl, and Cefepime in addition to LR bolus x2 ( , then ).  Differential: high likelihood of seizure as patient is very well appearing and seemingly at his baseline currently. Will maintain suspicion for infection given lactic acidosis and initial vital sign abnormalities, although no source identified after workup thus far.  -admit to med-surg, attending Dr. Deirdre Priest -Regular diet, OOB with supervision -Continue Cefepime until further workup obtained -s/p Linezolid and Flagyl x1 dose -LR @ 75 cc/hr -Repeat lactic  acid -Will obtain procalcitonin -Check Depakote level -f/u blood cx and urine cx results  AKI Patient's Cr 2.30, baseline appears to be ~1.3. Likely 2/2 volume depletion and expect resolution with IV fluid hydration.  -LR @ 75 cc/hr -Repeat BMP tomorrow am  Elevated Troponin Patient's high sensitivity troponin elevated to 23. EKG showed normal sinus rhythm at 80 without ST/T changes. Suspect demand ischemia in the setting of acute illness.  -Repeat troponin pending -Consider echo if persistently elevated  Anemia Patient's Hgb 10.2 with MCV 92.9. Patient reportedly has a history of iron deficiency anemia. Review of Care Everywhere records show baseline Hgb between 9.5-11.  -Continue to monitor  Hx Seizure Disorder Home meds include: Depakote 500mg  daily and Lamictal 100mg  daily. Per sister, last seizure ~5 years ago. -Continue home meds -Consider neuro consult for EEG  Hx Schizophrenia Well-controlled. Home meds include: Seroquel 400mg  BID, Depakote 500mg  daily, and Lamictal 100mg  daily. -Continue home meds  Hx Hypothyroidism Well controlled on Levothyroxine . -Continue levothyroxine daily.  FEN/GI: Regular diet Prophylaxis: Lovenox  Disposition: med-surg  History of Present Illness:  [name] is a 64 y.o. male presenting with AMS and hypotension. Per report, patient lives in a group home and was at his normal baseline at 7 AM. At 10 AM check for meds, he was found obtunded and hypotensive, so EMS was called. Patient has been his normal self over the past few days per group home with no recent illness. In the ED patient has no complaints. Denies pain, SOB, cough, N/V, dysuria. States he had normal BM yesterday.  Review Of Systems: Per HPI  Review of Systems   Patient Active Problem List   Diagnosis Date Noted  . Emesis 07/24/2015  . Coffee ground emesis   . Gastrointestinal hemorrhage associated  with other gastritis   . Acute esophagitis  01/09/2015  . Upper gastrointestinal bleed   . Diarrhea   . Nausea with vomiting   . GI bleed 01/08/2015  . GIB (gastrointestinal bleeding) 01/08/2015  . Nausea & vomiting 01/08/2015  . Reflux esophagitis 07/04/2009  . Loss of weight 07/04/2009  . BLOOD IN STOOL, OCCULT 07/04/2009  . ANEMIA, IRON DEFICIENCY 05/04/2008  . Hypothyroidism 04/05/2008  . Schizophrenia, unspecified type (HCC) 04/05/2008  . Depression 04/05/2008  . HEMIPARESIS, LEFT 04/05/2008  . Essential hypertension 04/05/2008  . GERD 04/05/2008  . Gastroparesis 04/05/2008  . Seizure (HCC) 04/05/2008  . History of diverticulosis 08/21/2004  . Barrett's esophagus 04/01/2002  . HIATAL HERNIA 04/01/2002    Past Medical History: Past Medical History:  Diagnosis Date  . Barrett esophagus   . Dysphagia   . Gastroparesis   . GERD (gastroesophageal reflux disease)   . Hypercholesteremia   . Iron deficiency anemia   . Mental retardation   . Schizophrenia (HCC)   . Seizure [facility])     Past Surgical History: Past Surgical History:  Procedure Laterality Date  . CRANIOTOMY FOR CYST FENESTRATION    . ESOPHAGOGASTRODUODENOSCOPY N/A 01/09/2015   Procedure: ESOPHAGOGASTRODUODENOSCOPY (EGD);  Surgeon: Louis Meckel, MD;  Location: [facility] ENDOSCOPY;  Service: Endoscopy;  Laterality: N/A;    Social History: Social History   Tobacco Use  . Smoking status: Never Smoker  . Smokeless tobacco: Never Used  Substance Use Topics  . Alcohol use: No  . Drug use: No    Family History: Unknown  Allergies and Medications: Allergies  Allergen Reactions  . Vancomycin Other (See Comments)    Unknown-possible hives    No current facility-administered medications on file prior to encounter.   Current Outpatient Medications on File Prior to Encounter  Medication Sig Dispense Refill  . benazepril (LOTENSIN) 20 MG tablet Take 20 mg by mouth daily.    . Cholecalciferol (VITAMIN D) 2000 units tablet Take 2,000 Units by mouth  daily.    . divalproex (DEPAKOTE ER) 500 MG 24 hr tablet Take 500 mg by mouth daily.    Marland Kitchen esomeprazole (NEXIUM) 40 MG capsule Take 40 mg by mouth in the morning and at bedtime.    . lamoTRIgine (LAMICTAL) 100 MG tablet Take 100 mg by mouth daily.    Marland Kitchen levothyroxine (SYNTHROID, LEVOTHROID) 75 MCG tablet Take 75 mcg by mouth daily.    . metoCLOPramide (REGLAN) 5 MG tablet Take 1 tablet (5 mg total) by mouth 3 (three) times daily before meals. (Patient taking differently: Take 5 mg by mouth in the morning and at bedtime. ) 90 tablet 0  . propranolol (INDERAL) 10 MG tablet Take 10 mg by mouth daily.    . QUEtiapine (SEROQUEL) 400 MG tablet Take 400 mg by mouth 2 (two) times daily.     . rosuvastatin (CRESTOR) 20 MG tablet Take 20 mg by mouth daily.    . ondansetron (ZOFRAN) 4 MG tablet Take 1 tablet (4 mg total) by mouth every 6 (six) hours. (Patient not taking: Reported on 09/05/2020) 20 tablet 0  . pantoprazole (PROTONIX) 40 MG tablet Take 1 tablet (40 mg total) by mouth 2 (two) times daily. (Patient not taking: Reported on 09/05/2020) 60 tablet 0  . sucralfate (CARAFATE) 1 g tablet Take 1 tablet (1 g total) by mouth 4 (four) times daily -  with meals and at bedtime. (Patient not taking: Reported on 09/05/2020) 90 tablet 1    Objective: BP 114/70  Pulse 92   Temp (!) 96.9 F (36.1 C) (Rectal)   Resp (!) 36   SpO2 97%  Exam: General: alert, non-toxic appearing, NAD Eyes: PERRL, normal sclera and conjunctiva ENTM: moist mucous membranes, oropharynx clear without erythema or edema Neck: no cervical lymphadenopathy Cardiovascular: RRR, normal S1/S2 without m/r/g Respiratory: normal WOB on room air, lungs CTAB Gastrointestinal: +BS, firm, mildly distended, nontender MSK: 5/5 strength in b/l upper and lower extremities Derm: skin intact, no erythema or lesions Neuro: oriented to place only Psych: appropriate affect  Labs and Imaging: CBC BMET  Recent Labs  Lab 09/05/20 1130 09/05/20 1130  09/05/20 1152  WBC 7.8  --   --   HGB 10.2*   < > 10.2*  HCT 33.9*   < > 30.0*  PLT 199  --   --    < > = values in this interval not displayed.   Recent Labs  Lab 09/05/20 1130 09/05/20 1130 09/05/20 1152  NA 142   < > 143  K 4.9   < > 4.9  CL 110   < > 109  CO2 23  --   --   BUN 31*   < > 36*  CREATININE 2.14*   < > 2.30*  GLUCOSE 115*   < > 105*  CALCIUM 7.4*  --   --    < > = values in this interval not displayed.     EKG: NSR at 90, normal axis, no ST-T wave changes   Maury Dus, MD 09/05/2020, 2:35 PM PGY-1, [facility] Family Medicine FPTS Intern pager: [phone], text pages welcome

## 2020-09-05 NOTE — ED Triage Notes (Signed)
Pt here from  A group home with aloc and hypotension , first s b/p was in the 50's , 74 /50 with ems after 2 liters of fluid , cbg 120 , pt alert on arrival

## 2020-09-05 NOTE — ED Notes (Signed)
RN spoke to patients sister, [name] and provided update.

## 2020-09-05 NOTE — ED Notes (Signed)
Sister and POA [name] would like an update [phone]

## 2020-09-05 NOTE — Progress Notes (Signed)
MEDICATION RELATED CONSULT NOTE - INITIAL   Pharmacy Consult for valproate Indication: Seizures   Allergies  Allergen Reactions  . Vancomycin Other (See Comments)    Unknown-possible hives     Patient Measurements:    Vital Signs: Temp: 98.3 F (36.8 C) (09/13 1524) Temp Source: Oral (09/13 1524) BP: 135/74 (09/13 2000) Pulse Rate: 91 (09/13 2000) Intake/Output from previous day: No intake/output data recorded. Intake/Output from this shift: No intake/output data recorded.  Labs: Recent Labs    09/05/20 1130 09/05/20 1152  WBC 7.8  --   HGB 10.2* 10.2*  HCT 33.9* 30.0*  PLT 199  --   APTT 27  --   CREATININE 2.14* 2.30*  ALBUMIN 2.8*  --   PROT 5.4*  --   AST 15  --   ALT 14  --   ALKPHOS 53  --   BILITOT 0.6  --    CrCl cannot be calculated (Unknown ideal weight.).   Microbiology: Recent Results (from the past 720 hour(s))  SARS Coronavirus 2 by RT PCR (hospital order, performed in [facility] hospital lab) Nasopharyngeal Nasopharyngeal Swab     Status: None   Collection Time: 09/05/20 11:00 AM   Specimen: Nasopharyngeal Swab  Result Value Ref Range Status   SARS Coronavirus 2 NEGATIVE NEGATIVE Final    Comment: (NOTE) SARS-CoV-2 target nucleic acids are NOT DETECTED.  The SARS-CoV-2 RNA is generally detectable in upper and lower respiratory specimens during the acute phase of infection. The lowest concentration of SARS-CoV-2 viral copies this assay can detect is 250 copies / mL. A negative result does not preclude SARS-CoV-2 infection and should not be used as the sole basis for treatment or other patient management decisions.  A negative result may occur with improper specimen collection / handling, submission of specimen other than nasopharyngeal swab, presence of viral mutation(s) within the areas targeted by this assay, and inadequate number of viral copies (<250 copies / mL). A negative result must be combined with clinical observations,  patient history, and epidemiological information.  Fact Sheet for Patients:   BoilerBrush.com.cy  Fact Sheet for Healthcare Providers: https://pope.com/  This test is not yet approved or  cleared by the [place] FDA and has been authorized for detection and/or diagnosis of SARS-CoV-2 by FDA under an Emergency Use Authorization (EUA).  This EUA will remain in effect (meaning this test can be used) for the duration of the COVID-19 declaration under Section 564(b)(1) of the Act, 21 U.S.C. section 360bbb-3(b)(1), unless the authorization is terminated or revoked sooner.  Performed at [facility], [address], [place], [place] [address]     Medical History: Past Medical History:  Diagnosis Date  . Barrett esophagus   . Dysphagia   . Gastroparesis   . GERD (gastroesophageal reflux disease)   . Hypercholesteremia   . Iron deficiency anemia   . Mental retardation   . Schizophrenia (HCC)   . Seizure (HCC)     Medications:  (Not in a hospital admission)   Assessment: 13 YOM who presented with altered mental status. She has a history of seizures on Depakote at home. A depakote level on admission is subtherapeutic. Discussed case with MD who is unsure if patient is compliant with medication regimen. Her valproate levels have historically fluctuated.   Goal of Therapy:  Valproate level > 50 mcg/ml   Plan:  -Per discussion with MD, will give one-time IV dose of valproate 500 mg and continue current dose of Depakote  ER 500 mg daily -Will plan to recheck valproate level at steady state and monitor for seizures   Vinnie Level, PharmD., BCPS, BCCCP Clinical Pharmacist Clinical phone for 09/05/20 until 11:30pm: [phone] If after 11:30pm, please refer to [facility] for unit-specific pharmacist

## 2020-09-05 NOTE — ED Provider Notes (Signed)
Medical screening examination/treatment/procedure(s) were conducted as a shared visit with non-physician practitioner(s) and myself.  I personally evaluated the patient during the encounter.  EKG Interpretation  Date/Time:  Monday September 05 2020 10:53:48 EDT Ventricular Rate:  87 PR Interval:    QRS Duration: 88 QT Interval:  371 QTC Calculation: 447 R Axis:   60 Text Interpretation: Sinus rhythm Nonspecific T abnormalities, lateral leads Confirmed by Lorre Nick ([id number]) on 09/05/2020 11:60:43 AM  64 year old male who presents with altered mental status and low blood pressure.  Complains of having a dull discomfort.  On abdominal exam, patient has diffuse tenderness.  Patient bolused with saline prior to coming here as well as given saline here.  Will order sepsis labs as well as start antibiotics and obtain intra-abdominal imaging   Lorre Nick, MD 09/05/20 1122

## 2020-09-05 NOTE — ED Notes (Signed)
Got patient on the monitor undress into a gown patient is resting with call bell in reach 

## 2020-09-05 NOTE — ED Provider Notes (Signed)
[facility]   CSN: [id number] Arrival date & time: 09/05/20  1046     History No chief complaint on file.   [name] is a 64 y.o. male history of obesity, GERD, Barrett's esophagus, hypercholesterolemia, MR, schizophrenia, seizures.  Patient arrived via EMS today from group home.  History obtained from EMS.  Patient with normal state of health around 7 AM this morning, group home went to go check on the patient around 10 AM at that time they found patient unconscious and called EMS.  On EMS arrival patient was found to be altered, hypotensive with blood pressure in the 50s.  EMS gave 2 L of normal saline with improvement to 74/50.  Patient was aroused at the group home and appeared to be at his mental baseline.  He has a history of CVA with residual left-sided weakness.  Was in normal state of health last few days.  Level 5 caveat MR  On initial evaluation patient reports that he is feeling fine he denies any complaints.  However during exam, palpation of the abdomen patient reported pain, reports it started this morning.  HPI     Past Medical History:  Diagnosis Date  . Barrett esophagus   . Dysphagia   . Gastroparesis   . GERD (gastroesophageal reflux disease)   . Hypercholesteremia   . Iron deficiency anemia   . Mental retardation   . Schizophrenia (HCC)   . Seizure [facility](HCC)     Patient Active Problem List   Diagnosis Date Noted  . Altered mental status 09/05/2020  . Emesis 07/24/2015  . Coffee ground emesis   . Gastrointestinal hemorrhage associated with other gastritis   . Acute esophagitis 01/09/2015  . Upper gastrointestinal bleed   . Diarrhea   . Nausea with vomiting   . GI bleed 01/08/2015  . GIB (gastrointestinal bleeding) 01/08/2015  . Nausea & vomiting 01/08/2015  . Reflux esophagitis 07/04/2009  . Loss of weight 07/04/2009  . BLOOD IN STOOL, OCCULT 07/04/2009  . ANEMIA, IRON DEFICIENCY  05/04/2008  . Hypothyroidism 04/05/2008  . Schizophrenia, unspecified type (HCC) 04/05/2008  . Depression 04/05/2008  . HEMIPARESIS, LEFT 04/05/2008  . Essential hypertension 04/05/2008  . GERD 04/05/2008  . Gastroparesis 04/05/2008  . Seizure (HCC) 04/05/2008  . History of diverticulosis 08/21/2004  . Barrett's esophagus 04/01/2002  . HIATAL HERNIA 04/01/2002    Past Surgical History:  Procedure Laterality Date  . CRANIOTOMY FOR CYST FENESTRATION    . ESOPHAGOGASTRODUODENOSCOPY N/A 01/09/2015   Procedure: ESOPHAGOGASTRODUODENOSCOPY (EGD);  Surgeon: Louis Meckelobert D Kaplan, MD;  Location[facility] ENDOSCOPY;  Service: Endoscopy;  Laterality: N/A;       No family history on file.  Social History   Tobacco Use  . Smoking status: Never Smoker  . Smokeless tobacco: Never Used  Substance Use Topics  . Alcohol use: No  . Drug use: No    Home Medications Prior to Admission medications   Medication Sig Start Date End Date Taking? Authorizing Provider  benazepril (LOTENSIN) 20 MG tablet Take 20 mg by mouth daily.   Yes [provider]  Cholecalciferol (VITAMIN D) 2000 units tablet Take 2,000 Units by mouth daily.   Yes [provider]  divalproex (DEPAKOTE ER) 500 MG 24 hr tablet Take 500 mg by mouth daily.   Yes [provider]  esomeprazole (NEXIUM) 40 MG capsule Take 40 mg by mouth in the morning and at bedtime.   Yes [provider]  lamoTRIgine (LAMICTAL) 100 MG tablet Take 100 mg by mouth daily.   Yes [provider]  levothyroxine (SYNTHROID, LEVOTHROID) 75 MCG tablet Take 75 mcg by mouth daily.   Yes [provider]  metoCLOPramide (REGLAN) 5 MG tablet Take 1 tablet (5 mg total) by mouth 3 (three) times daily before meals. Patient taking differently: Take 5 mg by mouth in the morning and at bedtime.  07/27/15  Yes Regalado, Belkys A, MD  propranolol (INDERAL) 10 MG tablet Take 10 mg by mouth daily. 03/31/18  Yes [provider]    QUEtiapine (SEROQUEL) 400 MG tablet Take 400 mg by mouth 2 (two) times daily.    Yes [provider]  rosuvastatin (CRESTOR) 20 MG tablet Take 20 mg by mouth daily.   Yes [provider]  ondansetron (ZOFRAN) 4 MG tablet Take 1 tablet (4 mg total) by mouth every 6 (six) hours. Patient not taking: Reported on 09/05/2020 03/13/16   Raeford Razor, MD  pantoprazole (PROTONIX) 40 MG tablet Take 1 tablet (40 mg total) by mouth 2 (two) times daily. Patient not taking: Reported on 09/05/2020 07/27/15   Regalado, Jon Billings A, MD  sucralfate (CARAFATE) 1 g tablet Take 1 tablet (1 g total) by mouth 4 (four) times daily -  with meals and at bedtime. Patient not taking: Reported on 09/05/2020 01/15/16   Eber Hong, MD    Allergies    Vancomycin  Review of Systems   Review of Systems  Unable to perform ROS: Other    Physical Exam Updated Vital Signs BP 114/70   Pulse 92   Temp (!) 96.9 F (36.1 C) (Rectal)   Resp (!) 36   SpO2 97%   Physical Exam Constitutional:      General: He is not in acute distress.    Appearance: Normal appearance. He is well-developed. He is not ill-appearing or diaphoretic.  HENT:     Head: Normocephalic and atraumatic.     Right Ear: External ear normal.  Eyes:     General: Vision grossly intact. Gaze aligned appropriately.     Pupils: Pupils are equal, round, and reactive to light.  Neck:     Trachea: Trachea and phonation normal.  Cardiovascular:     Rate and Rhythm: Normal rate and regular rhythm.  Pulmonary:     Effort: Pulmonary effort is normal. No respiratory distress.     Breath sounds: Normal breath sounds.  Abdominal:     General: There is no distension.     Palpations: Abdomen is soft.     Tenderness: There is abdominal tenderness. There is guarding. There is no rebound.  Musculoskeletal:        General: Normal range of motion.     Cervical back: Normal range of motion.  Skin:    General: Skin is warm and dry.  Neurological:      Mental Status: He is alert.     GCS: GCS eye subscore is 4. GCS verbal subscore is 5. GCS motor subscore is 6.     Comments: Alert to person, place, time/year, confused to event today.  Follows commands appropriately, goal oriented.  Speech somewhat slurred which EMS patient report is baseline.  Left upper and lower extremity 4/5 weakness compared to right.  Psychiatric:        Behavior: Behavior normal.     ED Results / Procedures / Treatments   Labs (all labs ordered are listed, but only abnormal results are displayed) Labs Reviewed  LACTIC ACID,  PLASMA - Abnormal; Notable for the following components:      Result Value   Lactic Acid, Venous 3.4 (*)    All other components within normal limits  COMPREHENSIVE METABOLIC PANEL - Abnormal; Notable for the following components:   Glucose, Bld 115 (*)    BUN 31 (*)    Creatinine, Ser 2.14 (*)    Calcium 7.4 (*)    Total Protein 5.4 (*)    Albumin 2.8 (*)    GFR calc non Af Amer 32 (*)    GFR calc Af Amer 37 (*)    All other components within normal limits  CBC WITH DIFFERENTIAL/PLATELET - Abnormal; Notable for the following components:   RBC 3.65 (*)    Hemoglobin 10.2 (*)    HCT 33.9 (*)    RDW 15.6 (*)    All other components within normal limits  I-STAT CHEM 8, ED - Abnormal; Notable for the following components:   BUN 36 (*)    Creatinine, Ser 2.30 (*)    Glucose, Bld 105 (*)    Calcium, Ion 1.13 (*)    Hemoglobin 10.2 (*)    HCT 30.0 (*)    All other components within normal limits  TROPONIN I (HIGH SENSITIVITY) - Abnormal; Notable for the following components:   Troponin I (High Sensitivity) 23 (*)    All other components within normal limits  CULTURE, BLOOD (SINGLE)  URINE CULTURE  SARS CORONAVIRUS 2 BY RT PCR (HOSPITAL ORDER, PERFORMED IN [facility])  PROTIME-INR  APTT  LACTIC ACID, PLASMA  URINALYSIS, ROUTINE W REFLEX MICROSCOPIC  CBG MONITORING, ED  TROPONIN I (HIGH SENSITIVITY)    EKG EKG  Interpretation  Date/Time:  Monday September 05 2020 10:53:48 EDT Ventricular Rate:  87 PR Interval:    QRS Duration: 88 QT Interval:  371 QTC Calculation: 447 R Axis:   60 Text Interpretation: Sinus rhythm Nonspecific T abnormalities, lateral leads Confirmed by Lorre Nick ([id number]) on 09/05/2020 11:22:07 AM   Radiology CT ABDOMEN PELVIS WO CONTRAST  Result Date: 09/05/2020 CLINICAL DATA:  Diffuse abdominal pain. Abscess/infection suspected. Renal insufficiency. EXAM: CT ABDOMEN AND PELVIS WITHOUT CONTRAST TECHNIQUE: Multidetector CT imaging of the abdomen and pelvis was performed following the standard protocol without IV contrast. COMPARISON:  CT 03/25/2018. FINDINGS: Lower chest: A large hiatal hernia is again noted, incompletely visualized, but enlarged compared with the previous study. There is associated adjacent atelectasis medially at both lung bases. No significant pleural or pericardial effusion. Hepatobiliary: No focal hepatic abnormalities on noncontrast imaging. No evidence of gallstones, gallbladder wall thickening or biliary dilatation. Pancreas: Unremarkable. No pancreatic ductal dilatation or surrounding inflammatory changes. Spleen: Normal in size without focal abnormality. Adrenals/Urinary Tract: Both adrenal glands appear normal. Probable mild renal cortical thinning bilaterally. No evidence of urinary tract calculus, hydronephrosis or perinephric soft tissue stranding. The bladder appears unremarkable. Stomach/Bowel: No evidence of bowel wall thickening, distention or surrounding inflammatory change. As above, enlarging hiatal hernia. The small bowel is fluid-filled without distension. The appendix is likely visualized on the coronal images and appears normal. There is a moderate amount of stool throughout the colon. Vascular/Lymphatic: There are no enlarged abdominal or pelvic lymph nodes. Mild iliac atherosclerosis. No acute vascular findings on noncontrast imaging.  Reproductive: The prostate gland and seminal vesicles appear normal. Other: Stable prominent fat in both inguinal canals. No additional abdominal wall hernia, ascites, free air or focal extraluminal fluid collection. Musculoskeletal: No acute or significant osseous findings. IMPRESSION: 1. No acute  findings or specific evidence of active infection. Fluid-filled small bowel without distention or surrounding inflammation, possibly enteritis. 2. Enlarging hiatal hernia. Electronically Signed   By: Carey Bullocks M.D.   On: 09/05/2020 13:59   DG Chest Port 1 View  Result Date: 09/05/2020 CLINICAL DATA:  Sepsis. EXAM: PORTABLE CHEST 1 VIEW COMPARISON:  02/09/2019 FINDINGS: The cardiac silhouette, mediastinal and hilar contours are upper limits of normal given the AP projection and portable technique. Mild tortuosity the thoracic aorta. The lungs are clear of an acute process. No pulmonary lesions or pleural effusions. The bony thorax is intact. IMPRESSION: No acute cardiopulmonary findings. Electronically Signed   By: Rudie Meyer M.D.   On: 09/05/2020 11:19   DG Abd Portable 1 View  Result Date: 09/05/2020 CLINICAL DATA:  Abdominal pain EXAM: PORTABLE ABDOMEN - 1 VIEW COMPARISON:  March 29, 2018 FINDINGS: There is moderate stool throughout colon. There is no bowel dilatation or air-fluid level to suggest bowel obstruction. No free air. Probable phlebolith in the lower left pelvis. IMPRESSION: Moderate stool in colon.  No bowel obstruction or free air evident. Electronically Signed   By: Bretta Bang III M.D.   On: 09/05/2020 11:56    Procedures .Critical Care Performed by: Bill Salinas, PA-C Authorized by: Bill Salinas, PA-C   Critical care provider statement:    Critical care time (minutes):  45   Critical care was necessary to treat or prevent imminent or life-threatening deterioration of the following conditions:  Sepsis   Critical care was time spent personally by me on the  following activities:  Discussions with consultants, evaluation of patient's response to treatment, examination of patient, ordering and performing treatments and interventions, ordering and review of laboratory studies, ordering and review of radiographic studies, pulse oximetry, re-evaluation of patient's condition, obtaining history from patient or surrogate, review of old charts and development of treatment plan with patient or surrogate   (including critical care time)  Medications Ordered in ED Medications  lactated ringers infusion ( Intravenous New Bag/Given 09/05/20 1333)  linezolid (ZYVOX) IVPB 600 mg (has no administration in time range)  ceFEPIme (MAXIPIME) 2 g in sodium chloride 0.9 % 100 mL IVPB (has no administration in time range)  lactated ringers bolus 1,000 mL (0 mLs Intravenous Stopped 09/05/20 1259)  ceFEPIme (MAXIPIME) 2 g in sodium chloride 0.9 % 100 mL IVPB (0 g Intravenous Stopped 09/05/20 1259)  metroNIDAZOLE (FLAGYL) IVPB 500 mg (0 mg Intravenous Stopped 09/05/20 1259)    ED Course  I have reviewed the triage vital signs and the nursing notes.  Pertinent labs & imaging results that were available during my care of the patient were reviewed by me and considered in my medical decision making (see chart for details).  Clinical Course as of Sep 05 1446  Mon Sep 05, 2020  1055 [facility] Sister [phone], no answer   [BM]  1055 Beasley,Robert   [phone], no answer   [BM]  1415 [name] [phone]: No Answer   [BM]    Clinical Course User Index [BM] Elizabeth Palau   MDM Rules/Calculators/A&P                          Additional history obtained from: 1. Nursing notes from this visit. 2. EMS. 3. Electronic Medical record. -------------------------------- I ordered, reviewed and interpreted labs which include: CBC shows anemia 10.2, no leuks ptosis. PT/INR within normal limits Lactic elevated 3.4 High-sensitivity troponin of 23,  will  obtain delta, question secondary to demand/hypotension CMP shows AKI creatinine 2.14, BUN 31, no emergent electrolyte derangement, LFT elevations or gap. CBG 96   Chest x-ray:  IMPRESSION:  No acute cardiopulmonary findings.   Abdomen x-ray:    IMPRESSION:  Moderate stool in colon. No bowel obstruction or free air evident.   CT AP:  IMPRESSION:  1. No acute findings or specific evidence of active infection.  Fluid-filled small bowel without distention or surrounding  inflammation, possibly enteritis.  2. Enlarging hiatal hernia.  - Patient reevaluated multiple times during this visit.  Blood pressure has improved with fluids.  Patient has received full 30 cc/kg bolus and started on broad spectrum antibiotics.  On reassessment he is resting comfortably no acute distress.  No clear source for infection at this time.  Urinalysis still needs to be collected.  I also discussed care with patient's power of attorney [name].  All parties are agreeable for admission for further care.  Discussed the case with residency service and patient was accepted for admission. COVID test pending.  Patient was seen and evaluated by Dr. Freida Busman during this visit agrees with work-up and admission at this time.  [name] was evaluated in Emergency Department on 09/05/2020 for the symptoms described in the history of present illness. He was evaluated in the context of the global COVID-19 pandemic, which necessitated consideration that the patient might be at risk for infection with the SARS-CoV-2 virus that causes COVID-19. Institutional protocols and algorithms that pertain to the evaluation of patients at risk for COVID-19 are in a state of rapid change based on information released by regulatory bodies including the CDC and federal and state organizations. These policies and algorithms were followed during the patient's care in the ED.    Note: Portions of this report may have been transcribed using  voice recognition software. Every effort was made to ensure accuracy; however, inadvertent computerized transcription errors may still be present. Final Clinical Impression(s) / ED Diagnoses Final diagnoses:  Sepsis with acute renal failure and septic shock, due to unspecified organism, unspecified acute renal failure type [facility])    Rx / DC Orders ED Discharge Orders    None       Elizabeth Palau 09/05/20 1447    Lorre Nick, MD 09/07/20 [phone]

## 2020-09-06 DIAGNOSIS — R4182 Altered mental status, unspecified: Secondary | ICD-10-CM | POA: Diagnosis not present

## 2020-09-06 DIAGNOSIS — G40909 Epilepsy, unspecified, not intractable, without status epilepticus: Secondary | ICD-10-CM | POA: Diagnosis not present

## 2020-09-06 LAB — CBC
HCT: 35.9 % — ABNORMAL LOW (ref 39.0–52.0)
Hemoglobin: 11.1 g/dL — ABNORMAL LOW (ref 13.0–17.0)
MCH: 28 pg (ref 26.0–34.0)
MCHC: 30.9 g/dL (ref 30.0–36.0)
MCV: 90.7 fL (ref 80.0–100.0)
Platelets: 199 10*3/uL (ref 150–400)
RBC: 3.96 MIL/uL — ABNORMAL LOW (ref 4.22–5.81)
RDW: 15.5 % (ref 11.5–15.5)
WBC: 7.6 10*3/uL (ref 4.0–10.5)
nRBC: 0 % (ref 0.0–0.2)

## 2020-09-06 LAB — BASIC METABOLIC PANEL
Anion gap: 11 (ref 5–15)
BUN: 22 mg/dL (ref 8–23)
CO2: 22 mmol/L (ref 22–32)
Calcium: 8 mg/dL — ABNORMAL LOW (ref 8.9–10.3)
Chloride: 107 mmol/L (ref 98–111)
Creatinine, Ser: 1.3 mg/dL — ABNORMAL HIGH (ref 0.61–1.24)
GFR calc Af Amer: >60 mL/min (ref >60)
GFR calc non Af Amer: 58 mL/min — ABNORMAL LOW (ref >60)
Glucose, Bld: 86 mg/dL (ref 70–99)
Potassium: 4.2 mmol/L (ref 3.5–5.1)
Sodium: 140 mmol/L (ref 135–145)

## 2020-09-06 LAB — URINE CULTURE: Culture: NO GROWTH

## 2020-09-06 LAB — PROCALCITONIN: Procalcitonin: <0.1 ng/mL

## 2020-09-06 LAB — HIV ANTIBODY (ROUTINE TESTING W REFLEX): HIV Screen 4th Generation wRfx: NONREACTIVE

## 2020-09-06 NOTE — Progress Notes (Addendum)
Family Medicine Teaching Service Daily Progress Note Intern Pager: [phone]  Patient name: [name] Medical record number: [id number] Date of birth: September 05, 1956 Age: 64 y.o. Gender: male  Primary Care Provider: [facility] Consultants: None Code Status: FULL  Pt Overview and Major Events to Date:  9/13: admitted  Assessment and Plan:  [name] is a 64 y.o. male presenting with altered mental status. PMH is significant for intellectual disability, schizophrenia, seizures, hypothyroidism, iron deficiency anemia, GERD and Barrett's esophagus.  Acute Encephalopathy, Resolved Resolved. Patient presented with altered mental status and hypotension, initially concerning for sepsis, however further workup not concerning for infection. Patient has been at his baseline since admission. Transient acute encephalopathy thought to be secondary to seizure with post-ictal period given patient's hx of seizure disorder, subtherapeutic depakote level, and rapid return to baseline with minimal intervention. -f/u blood and urine cx results -d/c antibiotics -management of seizure disorder as below  Seizure Disorder Patient's valproic acid level subtherapeutic at 37. Patient most likely had seizure at home and was found in post-ictal state. Home meds: Depakote 500mg  daily, Lamictal 100mg  daily. -s/p Depakote 500mg  IV x1 -Continue Depakote 500mg  daily -Continue Lamictal 100mg  daily -Recheck valproic acid level this afternoon  AKI, resolved Patient's Cr 2.30 on admission. Has improved to 1.30 this am, consistent with his baseline. -encourage hydration  Hx Schizophrenia Chronic, stable. Well controlled on Seroquel 400mg  BID, Depakote 500mg  daily and Lamictal 100mg  daily -Continue home meds  Hx hypothyroidism Chronic, stable.  -Continue home levothyroxine daily.  Elevated Troponin, resolved Patient's initial high sensitivity troponin minimally elevated to  23. EKG unremarkable. Repeat trop normal at 13. Suspect demand ischemia.  Normocytic Anemia Chronic, stable. Patients Hgb 11.1 this morning (at baseline).    FEN/GI: Regular diet PPx: Lovenox 30mg    Status is: Observation The patient remains OBS appropriate and will d/c before 2 midnights.  Dispo: The patient is from: Group home              Anticipated d/c is to: Group home              Anticipated d/c date is: 1 day              Patient currently is medically stable to d/c if valproic acid level returns >50.  Subjective:  Patient states he feels better. Has no specific complaints this morning. Appears to be at his baseline. Ate dinner well last night.  Objective: Temp:  [96.9 F (36.1 C)-98.8 F (37.1 C)] 98.4 F (36.9 C) (09/14 0826) Pulse Rate:  [77-105] 86 (09/14 0826) Resp:  [18-36] 20 (09/14 0826) BP: (81-138)/(52-74) 105/67 (09/14 0826) SpO2:  [96 %-99 %] 98 % (09/14 0826) Physical Exam: General: alert, well-appearing, NAD Cardiovascular: RRR, normal S1/S2 without m/r/g Respiratory: normal WOB, lungs CTAB Abdomen: +BS, soft, nontender Extremities: no peripheral edema, 2+ distal pulses Neuro: alert, oriented to person, place, time  Laboratory: Recent Labs  Lab 09/05/20 1130 09/05/20 1152 09/06/20 0156  WBC 7.8  --  7.6  HGB 10.2* 10.2* 11.1*  HCT 33.9* 30.0* 35.9*  PLT 199  --  199   Recent Labs  Lab 09/05/20 1130 09/05/20 1152 09/06/20 0156  NA 142 143 140  K 4.9 4.9 4.2  CL 110 109 107  CO2 23  --  22  BUN 31* 36* 22  CREATININE 2.14* 2.30* 1.30*  CALCIUM 7.4*  --  8.0*  PROT 5.4*  --   --   BILITOT 0.6  --   --  ALKPHOS 53  --   --   ALT 14  --   --   AST 15  --   --   GLUCOSE 115* 105* 86   Valproic acid level: 37 Lactic acid: 1.6 Procalcitonin: <0.10  Imaging: No new imaging.   Maury Dus, MD 09/06/2020, 9:23 AM PGY-1, [facility] FPTS Intern pager: [phone], text pages welcome

## 2020-09-06 NOTE — Plan of Care (Signed)
Progressing

## 2020-09-06 NOTE — Care Management Obs Status (Signed)
MEDICARE OBSERVATION STATUS NOTIFICATION   Patient Details  Name: [name] MRN: [id number] Date of Birth: August 19, 1956   Medicare Observation Status Notification Given:  Yes   CSW spoke with pt and also with pt sister/POA, [employer].    Lorri Frederick, LCSW 09/06/2020, 1:17 PM

## 2020-09-06 NOTE — TOC Initial Note (Signed)
Transition of Care [facility]) - Initial/Assessment Note    Patient Details  Name: [name] MRN: [id number] Date of Birth: 18-Mar-1956  Transition of Care [facility]) CM/SW Contact:    Lorri Frederick, LCSW Phone Number: 09/06/2020, 2:19 PM  Clinical Narrative:   Pt has IDD, spoke with CSW, gave permission to speak with sister and group home.  CSW spoke with sister [name], who reports she is pt POA.  She provided contact for pt group home, [facility], [phone], however this voicemail was unclear.  CSW spoke to group home phone number from face sheet, received call back from [facility] RN, [phone].  They are planning on pt return tomorrow and Rosey Bath reports we can call the main group home number (   [phone] ) and group home staff will pick pt up.  Rosey Bath also provided after hours on call #[phone].             Expected Discharge Plan: Group Home Barriers to Discharge: Continued Medical Work up   Patient Goals and CMS Choice        Expected Discharge Plan and Services Expected Discharge Plan: Group Home     Post Acute Care Choice: NA Living arrangements for the past 2 months: Group Home                                      Prior Living Arrangements/Services Living arrangements for the past 2 months: Group Home Lives with:: Facility Resident Patient language and need for interpreter reviewed:: Yes        Need for Family Participation in Patient Care: Yes (Comment) Care giver support system in place?: Yes (comment)   Criminal Activity/Legal Involvement Pertinent to Current Situation/Hospitalization: No - Comment as needed  Activities of Daily Living      Permission Sought/Granted Permission sought to share information with : Family Supports, Oceanographer granted to share information with : Yes, Verbal Permission Granted  Share Information with NAME: [name], sister  Permission granted to share  info w AGENCY: Group home        Emotional Assessment Appearance:: Appears stated age Attitude/Demeanor/Rapport: Other (comment) (drowsy) Affect (typically observed): Appropriate Orientation: : Oriented to Self, Oriented to  Time Alcohol / Substance Use: Not Applicable Psych Involvement: Outpatient Provider  Admission diagnosis:  Altered mental status [R41.82] Sepsis with acute renal failure and septic shock, due to unspecified organism, unspecified acute renal failure type (HCC) [A41.9, R65.21, N17.9] Patient Active Problem List   Diagnosis Date Noted  . Altered mental status 09/05/2020  . Emesis 07/24/2015  . Coffee ground emesis   . Gastrointestinal hemorrhage associated with other gastritis   . Acute esophagitis 01/09/2015  . Upper gastrointestinal bleed   . Diarrhea   . Nausea with vomiting   . GI bleed 01/08/2015  . GIB (gastrointestinal bleeding) 01/08/2015  . Nausea & vomiting 01/08/2015  . Reflux esophagitis 07/04/2009  . Loss of weight 07/04/2009  . BLOOD IN STOOL, OCCULT 07/04/2009  . ANEMIA, IRON DEFICIENCY 05/04/2008  . Hypothyroidism 04/05/2008  . Schizophrenia, unspecified type (HCC) 04/05/2008  . Depression 04/05/2008  . HEMIPARESIS, LEFT 04/05/2008  . Essential hypertension 04/05/2008  . GERD 04/05/2008  . Gastroparesis 04/05/2008  . Seizure (HCC) 04/05/2008  . History of diverticulosis 08/21/2004  . Barrett's esophagus 04/01/2002  . HIATAL HERNIA 04/01/2002   PCP:  [facility] Pharmacy:  [id number] - [place], [place] - [address] AT [address] OF [address] [place] [address] Phone: [phone] Fax: [phone]     Social Determinants of Health (SDOH) Interventions    Readmission Risk Interventions No flowsheet data found.

## 2020-09-06 NOTE — Progress Notes (Signed)
Patient is resting well. Responding appropriately. Patient verbalizes being ready to go home.

## 2020-09-06 NOTE — Discharge Summary (Addendum)
Family Medicine Teaching [facility] Discharge Summary  Patient name: [name] Medical record number: [id number] Date of birth: 04-26-1956 Age: 64 y.o. Gender: male Date of Admission: 09/05/2020  Date of Discharge: 09/06/2020 Admitting Physician: Shirlean Mylar, MD  Primary Care Provider: [facility] Consultants: None  Indication for Hospitalization: Altered mental status  Discharge Diagnoses/Problem List:  Seizure Disorder Intellectual Disability Hypothyroidism Schizophrenia Anemia  Disposition: Group Home  Discharge Condition: Stable, Improved  Discharge Exam:  General: alert, well-appearing, NAD Cardiovascular: RRR, normal S1/S2 without m/r/g Respiratory: normal WOB, lungs CTAB Abdomen: +BS, soft, nontender Extremities: no peripheral edema, 2+ distal pulses Neuro: alert, oriented to person, place, time  Brief Hospital Course:  [name] is a 64 y.o. male who presented with altered mental status and hypotension. PMH is significant for intellectual disability, schizophrenia, seizures, hypothyroidism, iron deficiency anemia, GERD and Barrett's esophagus.  Altered Mental Status, Sepsis ruled out Patient presented from group home with altered mental status. On day of presentation, he was at baseline at 7am, but at 10am was found to be lethargic with decreased responsiveness and hypotension (MAP in 50s). On arrival to the ED, patient was initially hypotensive to 81/55 (MAP 64) and hypothermic to 96.6, so sepsis protocol was initiated. He was given IV fluid bolus and Linezolid, Cefepime, and Flagyl x1. Lactic acid elevated to 3.4, but remainder of infectious workup was negative including normal WBC, procal <0.10, CXR negative, UA normal, CT abd/pelvis unremarkable other than hiatal hernia. Blood cultures no growth to date. At the time of admission, patient's blood pressure had improved, repeat temperature was normal, and he was at his  baseline mental status. Repeat lactic acid wnl at 1.6. Ultimately, patient's altered mental status thought to be secondary to seizure/post-ictal state-- see below.  Seizure Disorder Patient's altered mental status thought to be 2/2 unwitnessed seizure with post-ictal state. By the time of admission, patient had returned to his normal baseline mental status. No further seizure activity noted during admission. His valproic acid level was subtherapeutic at 37. He was given 500mg  Depakote IV x1 and maintained on his home dose of Depakote 500mg  daily. He was recommended to follow up with neurology for further management of his seizure disorder. He will need repeat Valproic acid level in 3-7 days to ensure adequate therapy.   He was continued on his home medications for his hypothyroidism and schizophrenia throughout admission.   Issues for Follow Up:  Patient will need repeat valproic acid level in 3-7 days to ensure therapeutic level.  Significant Procedures: None  Significant Labs and Imaging:  Recent Labs  Lab 09/05/20 1130 09/05/20 1152 09/06/20 0156  WBC 7.8  --  7.6  HGB 10.2* 10.2* 11.1*  HCT 33.9* 30.0* 35.9*  PLT 199  --  199   Recent Labs  Lab 09/05/20 1130 09/05/20 1130 09/05/20 1152 09/06/20 0156  NA 142  --  143 140  K 4.9   < > 4.9 4.2  CL 110  --  109 107  CO2 23  --   --  22  GLUCOSE 115*  --  105* 86  BUN 31*  --  36* 22  CREATININE 2.14*  --  2.30* 1.30*  CALCIUM 7.4*  --   --  8.0*  ALKPHOS 53  --   --   --   AST 15  --   --   --   ALT 14  --   --   --   ALBUMIN 2.8*  --   --   --    < > =  values in this interval not displayed.   Valproic acid: 37  Results/Tests Pending at Time of Discharge: urine culture  Discharge Medications:  Allergies as of 09/06/2020       Reactions   Vancomycin Other (See Comments)   Unknown-possible hives        Medication List     STOP taking these medications    ondansetron 4 MG tablet Commonly known as:  ZOFRAN   pantoprazole 40 MG tablet Commonly known as: PROTONIX   sucralfate 1 g tablet Commonly known as: Carafate       TAKE these medications    benazepril 20 MG tablet Commonly known as: LOTENSIN Take 20 mg by mouth daily.   divalproex 500 MG 24 hr tablet Commonly known as: DEPAKOTE ER Take 500 mg by mouth daily.   esomeprazole 40 MG capsule Commonly known as: NEXIUM Take 40 mg by mouth in the morning and at bedtime.   lamoTRIgine 100 MG tablet Commonly known as: LAMICTAL Take 100 mg by mouth daily.   levothyroxine 75 MCG tablet Commonly known as: SYNTHROID Take 75 mcg by mouth daily.   metoCLOPramide 5 MG tablet Commonly known as: REGLAN Take 1 tablet (5 mg total) by mouth 3 (three) times daily before meals. What changed: when to take this   propranolol 10 MG tablet Commonly known as: INDERAL Take 10 mg by mouth daily.   QUEtiapine 400 MG tablet Commonly known as: SEROQUEL Take 400 mg by mouth 2 (two) times daily.   rosuvastatin 20 MG tablet Commonly known as: CRESTOR Take 20 mg by mouth daily.   Vitamin D 50 MCG (2000 UT) tablet Take 2,000 Units by mouth daily.        Discharge Instructions: Please refer to Patient Instructions section of EMR for full details.  Patient was counseled important signs and symptoms that should prompt return to medical care, changes in medications, dietary instructions, activity restrictions, and follow up appointments.   Follow-Up Appointments:  Patient will follow up with group home physician within 1 week.  Maury Dus, MD 09/06/2020, 4:38 PM PGY-1, [facility]  Orpah Cobb, DO [facility], PGY3 09/08/2020 8:43 AM

## 2020-09-06 NOTE — Progress Notes (Signed)
[facility] called and made aware of patient discharge. Transportation arranged. Elbert Ewings updated regarding patient discharge as well patient to be escorted out via wheelchair by nursing staff.

## 2020-09-06 NOTE — Hospital Course (Addendum)
[name] is a 64 y.o. male who presented with altered mental status and hypotension. PMH is significant for intellectual disability, schizophrenia, seizures, hypothyroidism, iron deficiency anemia, GERD and Barrett's esophagus.  Altered Mental Status, Sepsis ruled out Patient presented from group home with altered mental status. On day of presentation, he was at baseline at 7am, but at 10am was found to be lethargic with decreased responsiveness and hypotension (MAP in 50s). On arrival to the ED, patient was initially hypotensive to 81/55 (MAP 64) and hypothermic to 96.6, so sepsis protocol was initiated. He was given Linezolid, Cefepime, and Flagyl x1 as well as IV fluid bolus. Lactic acid elevated to 3.4, but remainder of infectious workup was negative including normal WBC, procal <0.10, CXR negative, UA normal, CT abd/pelvis unremarkable other than hiatal hernia. Blood cultures no growth to date. On admission, patient's blood pressure had improved, repeat temperature was normal, and he was at his baseline mental status. Repeat lactic acid wnl. Ultimately, patient's altered mental status thought to be secondary to seizure/post-ictal state-- see below.  Seizure Disorder Patient's altered mental status thought to be 2/2 unwitnessed seizure with post-ictal state. By the time of admission, patient had returned to his normal baseline mental status. No further seizure activity noted during admission. His valproic acid level was subtherapeutic at 37. He was given 500mg  Depakote IV x1 and maintained on his home dose of Depakote 500mg  daily.

## 2020-09-06 NOTE — Plan of Care (Signed)

## 2020-09-06 NOTE — ED Notes (Signed)
First attempt to call report unsuccessful. 

## 2020-09-07 ENCOUNTER — Emergency Department (HOSPITAL_COMMUNITY)
Admission: EM | Admit: 2020-09-07 | Discharge: 2020-09-07 | Disposition: A | Discharge status: Home / Self Care | Payer: Medicare Other | Attending: Emergency Medicine | Admitting: Emergency Medicine

## 2020-09-07 ENCOUNTER — Other Ambulatory Visit: Payer: Self-pay

## 2020-09-07 DIAGNOSIS — Z79899 Other long term (current) drug therapy: Secondary | ICD-10-CM | POA: Diagnosis not present

## 2020-09-07 DIAGNOSIS — K5900 Constipation, unspecified: Secondary | ICD-10-CM

## 2020-09-07 DIAGNOSIS — R252 Cramp and spasm: Secondary | ICD-10-CM | POA: Diagnosis not present

## 2020-09-07 DIAGNOSIS — E039 Hypothyroidism, unspecified: Secondary | ICD-10-CM | POA: Diagnosis not present

## 2020-09-07 DIAGNOSIS — Z8669 Personal history of other diseases of the nervous system and sense organs: Secondary | ICD-10-CM

## 2020-09-07 DIAGNOSIS — R531 Weakness: Secondary | ICD-10-CM | POA: Diagnosis not present

## 2020-09-07 DIAGNOSIS — I10 Essential (primary) hypertension: Secondary | ICD-10-CM | POA: Diagnosis present

## 2020-09-07 DIAGNOSIS — Z79890 Hormone replacement therapy (postmenopausal): Secondary | ICD-10-CM | POA: Diagnosis not present

## 2020-09-07 LAB — CBC
HCT: 33.8 % — ABNORMAL LOW (ref 39.0–52.0)
Hemoglobin: 10.1 g/dL — ABNORMAL LOW (ref 13.0–17.0)
MCH: 28 pg (ref 26.0–34.0)
MCHC: 29.9 g/dL — ABNORMAL LOW (ref 30.0–36.0)
MCV: 93.6 fL (ref 80.0–100.0)
Platelets: 191 10*3/uL (ref 150–400)
RBC: 3.61 MIL/uL — ABNORMAL LOW (ref 4.22–5.81)
RDW: 15.7 % — ABNORMAL HIGH (ref 11.5–15.5)
WBC: 6.4 10*3/uL (ref 4.0–10.5)
nRBC: 0.3 % — ABNORMAL HIGH (ref 0.0–0.2)

## 2020-09-07 LAB — BASIC METABOLIC PANEL
Anion gap: 13 (ref 5–15)
BUN: 16 mg/dL (ref 8–23)
CO2: 23 mmol/L (ref 22–32)
Calcium: 9.1 mg/dL (ref 8.9–10.3)
Chloride: 104 mmol/L (ref 98–111)
Creatinine, Ser: 1.26 mg/dL — ABNORMAL HIGH (ref 0.61–1.24)
GFR calc Af Amer: >60 mL/min (ref >60)
GFR calc non Af Amer: >60 mL/min (ref >60)
Glucose, Bld: 98 mg/dL (ref 70–99)
Potassium: 4.4 mmol/L (ref 3.5–5.1)
Sodium: 140 mmol/L (ref 135–145)

## 2020-09-07 LAB — URINALYSIS, ROUTINE W REFLEX MICROSCOPIC
Bilirubin Urine: NEGATIVE
Glucose, UA: NEGATIVE mg/dL
Hgb urine dipstick: NEGATIVE
Ketones, ur: 5 mg/dL — AB
Leukocytes,Ua: NEGATIVE
Nitrite: NEGATIVE
Protein, ur: NEGATIVE mg/dL
Specific Gravity, Urine: 1.012 (ref 1.005–1.030)
pH: 7 (ref 5.0–8.0)

## 2020-09-07 LAB — VALPROIC ACID LEVEL: Valproic Acid Lvl: 76 ug/mL (ref 50.0–100.0)

## 2020-09-07 MED ORDER — SODIUM CHLORIDE 0.9 % IV BOLUS
1000.0000 mL | Freq: Once | INTRAVENOUS | Status: AC
  Administered 2020-09-07: 1000 mL via INTRAVENOUS

## 2020-09-07 NOTE — ED Notes (Signed)
Pt gait appeared unsteady upon exiting pt restroom, pt assisted to bed by this RN and sister. Per sister pt gait is usually steady and pt is able to ambulate with no assistance. Sister is on the phone with facility regarding gait at this time, per nurse pt gait and balance change noticed this morning at facility.

## 2020-09-07 NOTE — ED Notes (Signed)
Pt ambulated with assistance of ED staff and sister to pt restroom.

## 2020-09-07 NOTE — ED Provider Notes (Addendum)
[facility] EMERGENCY DEPARTMENT Provider Note   CSN: [id number] Arrival date & time: 09/07/20  1149    History Chief Complaint  Patient presents with  . Weakness   [name] is a 64 y.o. male with past medical history significant for MR, schizoaffective disorder, iron deficiency anemia, GERD, gastroparesis who presents for evaluation of hypertension and pain.  Patient is a member of a group home.  His sister who is medical POA provided majority of history--[name] Eliseo Gum [phone]  Patient recently discharged from hospital yesterday evening where he was admitted for hypotension and seizure like activity.  Was found to be lethargic at his group home on 09/05/2020.  Patient was hypotensive and hypothermic at that time.  He was given broad-spectrum antibiotics and code sepsis was called on prior admission.  Was noted to have AKI.  There was also concern for possible seizure-like activity.  Patient does have history of seizures.  Was followed previously by Dr. Thad Ranger with [facility] neurology however is not been seen over the last 7 years.  Depakote was being followed by PCP according to medical POA. Was found to have low levels of Depakote on prior admission.  According to sister patient discharged approximately 1700 09/06/2020.  According to sister patient did not sleep well last night.  He awoke this morning, was ambulatory.  He had a good breakfast however was tired.  Was noted to have right hip pain/ lower abd pain.  Stated he could not walk at that time due to pain. Patient did not have any recent falls or injuries per group home.  Noted that patient had elevated blood pressure into the 170s systolic when he had the pain.  Pain resolved upon EMS arrival.  He denies any current pain. He has not had a fever, chills, nausea, vomiting, headache, weakness, numbness, facial droop, chest pain, shortness of breath, diarrhea or dysuria. No paresthesias, rashes or lesions.  Last BM 4 days ago. No AMS per family in room.  Patient is vaccinated against Covid.  Patient sister is concerned that they did not find the cause of his hypotension and brought him back here to emergency department for evaluation to determine possible cause of his prior hypotension which has resolved.  History obtained from patient, sister [name] and past medical history. No interpretor was used.  HPI     Past Medical History:  Diagnosis Date  . Barrett esophagus   . Dysphagia   . Gastroparesis   . GERD (gastroesophageal reflux disease)   . Hypercholesteremia   . Iron deficiency anemia   . Mental retardation   . Schizophrenia (HCC)   . Seizure [facility])     Patient Active Problem List   Diagnosis Date Noted  . Altered mental status 09/05/2020  . Emesis 07/24/2015  . Coffee ground emesis   . Gastrointestinal hemorrhage associated with other gastritis   . Acute esophagitis 01/09/2015  . Upper gastrointestinal bleed   . Diarrhea   . Nausea with vomiting   . GI bleed 01/08/2015  . GIB (gastrointestinal bleeding) 01/08/2015  . Nausea & vomiting 01/08/2015  . Reflux esophagitis 07/04/2009  . Loss of weight 07/04/2009  . BLOOD IN STOOL, OCCULT 07/04/2009  . ANEMIA, IRON DEFICIENCY 05/04/2008  . Hypothyroidism 04/05/2008  . Schizophrenia, unspecified type (HCC) 04/05/2008  . Depression 04/05/2008  . HEMIPARESIS, LEFT 04/05/2008  . Essential hypertension 04/05/2008  . GERD 04/05/2008  . Gastroparesis 04/05/2008  . Seizure (HCC) 04/05/2008  . History of diverticulosis 08/21/2004  .  Barrett's esophagus 04/01/2002  . HIATAL HERNIA 04/01/2002    Past Surgical History:  Procedure Laterality Date  . CRANIOTOMY FOR CYST FENESTRATION    . ESOPHAGOGASTRODUODENOSCOPY N/A 01/09/2015   Procedure: ESOPHAGOGASTRODUODENOSCOPY (EGD);  Surgeon: Louis Meckel, MD;  Location: [facility] ENDOSCOPY;  Service: Endoscopy;  Laterality: N/A;       No family history on file.  Social History    Tobacco Use  . Smoking status: Never Smoker  . Smokeless tobacco: Never Used  Substance Use Topics  . Alcohol use: No  . Drug use: No    Home Medications Prior to Admission medications   Medication Sig Start Date End Date Taking? Authorizing Provider  benazepril (LOTENSIN) 20 MG tablet Take 20 mg by mouth daily.    [provider]  Cholecalciferol (VITAMIN D) 2000 units tablet Take 2,000 Units by mouth daily.    [provider]  divalproex (DEPAKOTE ER) 500 MG 24 hr tablet Take 500 mg by mouth daily.    [provider]  esomeprazole (NEXIUM) 40 MG capsule Take 40 mg by mouth in the morning and at bedtime.    [provider]  lamoTRIgine (LAMICTAL) 100 MG tablet Take 100 mg by mouth daily.    [provider]  levothyroxine (SYNTHROID, LEVOTHROID) 75 MCG tablet Take 75 mcg by mouth daily.    [provider]  metoCLOPramide (REGLAN) 5 MG tablet Take 1 tablet (5 mg total) by mouth 3 (three) times daily before meals. Patient taking differently: Take 5 mg by mouth in the morning and at bedtime.  07/27/15   Regalado, Belkys A, MD  propranolol (INDERAL) 10 MG tablet Take 10 mg by mouth daily. 03/31/18   [provider]  QUEtiapine (SEROQUEL) 400 MG tablet Take 400 mg by mouth 2 (two) times daily.     [provider]  rosuvastatin (CRESTOR) 20 MG tablet Take 20 mg by mouth daily.    [provider]    Allergies    Vancomycin  Review of Systems   Review of Systems  Constitutional: Negative.   HENT: Negative.   Respiratory: Negative.   Cardiovascular: Negative.   Gastrointestinal: Negative.   Genitourinary: Negative.   Musculoskeletal:       Right hip pain, resolved on arrival  Skin: Negative.   Neurological: Negative.   All other systems reviewed and are negative.  Physical Exam Updated Vital Signs BP (!) 154/96   Pulse 83   Temp 98.5 F (36.9 C) (Oral)   Resp (!) 31   SpO2 97%   Physical  Exam Vitals and nursing note reviewed.  Constitutional:      General: He is not in acute distress.    Appearance: He is well-developed. He is not ill-appearing, toxic-appearing or diaphoretic.  HENT:     Head: Normocephalic and atraumatic.     Nose: Nose normal.     Mouth/Throat:     Mouth: Mucous membranes are moist.  Eyes:     Pupils: Pupils are equal, round, and reactive to light.  Cardiovascular:     Rate and Rhythm: Normal rate and regular rhythm.     Pulses: Normal pulses.          Radial pulses are 2+ on the right side and 2+ on the left side.       Dorsalis pedis pulses are 2+ on the right side and 2+ on the left side.       Posterior tibial pulses are 2+ on the right side  and 2+ on the left side.     Heart sounds: Normal heart sounds.  Pulmonary:     Effort: Pulmonary effort is normal. No respiratory distress.     Breath sounds: Normal breath sounds and air entry.     Comments: Clear to auscultation bilateral without wheeze, rhonchi or rales Chest:     Comments: Equal rise and fall to chest wall.  No crepitus or overlying skin changes Abdominal:     General: Bowel sounds are normal. There is no distension.     Palpations: Abdomen is soft.     Tenderness: There is no abdominal tenderness. There is no right CVA tenderness, left CVA tenderness, guarding or rebound. Negative signs include McBurney's sign, psoas sign and obturator sign.     Hernia: No hernia is present.     Comments: Soft, nontender without rebound or guarding  Musculoskeletal:        General: Normal range of motion.     Cervical back: Normal range of motion and neck supple.     Comments: Compartments soft.  Moves all 4 extremities. No shortening or rotation of legs.  Pelvis stable, nontender.  Is able to flex and extend at bilateral hips.  Able to flex and extend at bilateral knees.  Wiggles toes without difficulty.  No midline tenderness to back.  Skin:    General: Skin is warm and dry.     Capillary  Refill: Capillary refill takes less than 2 seconds.     Comments: No edema, erythema or warmth.  No fluctuance or induration  Neurological:     Mental Status: He is alert. Mental status is at baseline.     Sensory: Sensation is intact.     Comments: At baseline per family. Residual weakness to left upper and lower extremity 4/5. Up to bathroom with family at baseline. Motor at baseline per family. Unable to left shoulder shrug- at baseline. Speech slurred at baseline per family. Tongue midline. Unable to perform heel to shin, finger to nose and romberg on left due to prior craniotomy. Shuffle gait. No ataxia.    ED Results / Procedures / Treatments   Labs (all labs ordered are listed, but only abnormal results are displayed) Labs Reviewed  BASIC METABOLIC PANEL - Abnormal; Notable for the following components:      Result Value   Creatinine, Ser 1.26 (*)    All other components within normal limits  CBC - Abnormal; Notable for the following components:   RBC 3.61 (*)    Hemoglobin 10.1 (*)    HCT 33.8 (*)    MCHC 29.9 (*)    RDW 15.7 (*)    nRBC 0.3 (*)    All other components within normal limits  URINALYSIS, ROUTINE W REFLEX MICROSCOPIC - Abnormal; Notable for the following components:   Ketones, ur 5 (*)    All other components within normal limits  VALPROIC ACID LEVEL  CBG MONITORING, ED    EKG EKG Interpretation  Date/Time:  Wednesday September 07 2020 11:53:12 EDT Ventricular Rate:  79 PR Interval:  118 QRS Duration: 74 QT Interval:  370 QTC Calculation: 424 R Axis:   47 Text Interpretation: Normal sinus rhythm Nonspecific T wave abnormality Abnormal ECG No STEMI Confirmed by Alvester Chourifan, Matthew [id number]) on 09/07/2020 5:37:31 PM   Radiology No results found.  Procedures Procedures (including critical care time)  Medications Ordered in ED Medications  sodium chloride 0.9 % bolus 1,000 mL (1,000 mLs Intravenous New Bag/Given 09/07/20 1946)  ED Course  I have  reviewed the triage vital signs and the nursing notes.  Pertinent labs & imaging results that were available during my care of the patient were reviewed by me and considered in my medical decision making (see chart for details).  64 year old presents for evaluation of  pain.  He is afebrile, nonseptic, not ill-appearing.  He appears otherwise well.  Recently discharged from hospital yesterday for hypotension, thought likely due to hypovolemia.  Patient with possible seizure-like activity on prior admission however none since discharge.  Was noted to have low Depakote level.  He was given IV Depakote and PO medication.  No seizure-like activity.  Was up this morning walking around.  Sat down and felt pain at his right lower abd, hip area.  Could not walk at that time.  He did not fall.  Was noted to be hypertensive into the 170s systolic per sister.  Pain and hypertension resolved prior to EMS arrival.  Patient without any complaints.  Sister is concerned that he feels a discharge patient too early from hospital yesterday.  Patient reviewed his labs and imaging.  His blood cultures have not grown anything.  His urinalysis did not grow anything on culture.  CT abdomen with hiatal hernia, possible enteritis. Patient denies any diarrhea, last bowel movement 4 days ago.  Denies any urinary complaints.  His heart and lungs are clear. Patient with weakness to his left upper and lower extremity which family says is at baseline from prior craniotomy. He had stable troponins.  His hypotension and altered mental status resolved with IV fluids on prior admission.   Labs and imaging started from triage.  I personally reviewed and interpreted this:  CBC without leukocytosis, hemoglobin 10.1, similar to prior Metabolic panel with creatinine 1.26, improved from discharge yesterday EKG similar to previous. No STEMI  Patient reassessed.  Had not had any hypotension here in the emergency department.  He has been ambulatory  with nursing.  I suspect patient's right hip pain is actually more right side abdominal pain which has resolved. Has not had a bowel movement in 4 days.  Consider bowel spasms from constipation.  He is passing flatulence tolerating p.o. intake.  I have low suspicion for obstruction.  Valproic acid level at therapeutic levels.  Discussed bowel regimen with patient's medical POA.  Also discussed following up outpatient with neurology.  Patient and sister are agreeable to this.  Unfortunately patient's IV has not been running his fluids.  Sister requesting finish of IV fluids prior to discharge.  Patient actively tolerating PO on reassessment.   Nursing noted that patient needed assistance with gait to ambulate back from restroom where he had a successful BM.  I ambulated patient in room.  Patient stated his legs cramp when he walks.  He has no ataxic gait.  His neurologic exam continues to be at baseline.  He denies any back pain.  I have low suspicion for acute neurosurgical emergency, CVA, central cause of his pain, dissection as cause of his bilateral lower extremity pain when he walks.  Question cramping.  No unilateral leg swelling, redness and warmth.  He is neurovascularly intact.  I low suspicion for DVT.  Patient with full range of motion to bilateral lower extremities and no bony tenderness have low suspicion for acute fracture or dislocation.  I discussed with attending Dr. Renaye Rakers patients BL lower extremities pain with walking. He does not feel patient needs further work-up at this time. Patient will be DC  home back to his group home via his sister.  I discussed plan with family in room and they are agreeable to this.  They will return for any worsening symptoms.  The patient has been appropriately medically screened and/or stabilized in the ED. I have low suspicion for any other emergent medical condition which would require further screening, evaluation or treatment in the ED or require inpatient  management.  Patient is hemodynamically stable and in no acute distress.  Patient able to ambulate in department prior to ED.  Evaluation does not show acute pathology that would require ongoing or additional emergent interventions while in the emergency department or further inpatient treatment.  I have discussed the diagnosis with the patient and answered all questions.  Pain is been managed while in the emergency department and patient has no further complaints prior to discharge.  Patient is comfortable with plan discussed in room and is stable for discharge at this time.  I have discussed strict return precautions for returning to the emergency department.  Patient was encouraged to follow-up with PCP/specialist refer to at discharge.  Patient seen and evaluated by attending, Dr. Renaye Rakers who agrees above treatment, plan and disposition  The patient has been appropriately medically screened and/or stabilized in the ED. I have low suspicion for any other emergent medical condition which would require further screening, evaluation or treatment in the ED or require inpatient management.  Patient is hemodynamically stable and in no acute distress.  Patient able to ambulate in department prior to ED.  Evaluation does not show acute pathology that would require ongoing or additional emergent interventions while in the emergency department or further inpatient treatment.  I have discussed the diagnosis with the patient and answered all questions.  Pain is been managed while in the emergency department and patient has no further complaints prior to discharge.  Patient is comfortable with plan discussed in room and is stable for discharge at this time.  I have discussed strict return precautions for returning to the emergency department.  Patient was encouraged to follow-up with PCP/specialist refer to at discharge.   Clinical Course as of Sep 08 2239  Wed Sep 07, 2020  3059 64 year old male with a history  intellectual delay presenting from a group home with concern for pain in his right lower abdomen and hypertension.  The patient was discharged from the hospital yesterday after admission for possible seizure, found to have a subtherapeutic Dilantin level, given IV Dilantin and discharged on p.o. Dilantin.  He presents today in the company of his sister who is his medical power of attorney.  He tells me the patient began complaining of severe pain in his right lower abdomen earlier today.  He has never had this kind of pain before.  He has no history of sciatica.  He has no urinary symptoms.  His blood pressure was elevated in the nursing home, is come back to normal now.  The patient says his pain is gone completely.  He did have a CT scan 2 days ago during last hospitalization which did not show any signs of hernia, or kidney stones, or other acute intra-abdominal process.  He has a completely benign abdominal exam.  He has no tenderness on abdominal exam.  He has a negative straight leg test and no reproducible hip pain or sciatica.   [MT]  1945 From his history does appear that he is suffering from constipation.  He is having very tiny bowel movements.  He has not  vomiting and distended discussed that he has developed an obstruction.  However I would recommend a soft liquid diet for 2 days, bowel cleanout, and of course follow-up with his neurologist regarding his Dilantin levels.  We can recheck his level today to ensure it is trending upwards.   [MT]  2046 Valproic Acid,S: 76 [MT]    Clinical Course User Index [MT] Terald Sleeper, MD   MDM Rules/Calculators/A&P                           Final Clinical Impression(s) / ED Diagnoses Final diagnoses:  Constipation, unspecified constipation type  Hx of tonic-clonic seizures  Generalized weakness  Muscle cramping    Rx / DC Orders ED Discharge Orders         Ordered    Ambulatory referral to Neurology       Comments: An appointment is  requested in approximately: 1 week   09/07/20 2219           Tianna Baus A, PA-C 09/07/20 2239    Yandiel Bergum A, PA-C 09/07/20 2241    Terald Sleeper, MD 09/08/20 [phone]

## 2020-09-07 NOTE — ED Triage Notes (Signed)
Pt here from group home for generalized weakness. Discharged yesterday from hospitalization d/t dehydration, seizure, hypertension. Pt reports he did not sleep at all last night and feels tired this morning. Mentation at baseline per staff. Hx schizophrenia.

## 2020-09-07 NOTE — Discharge Instructions (Signed)
We have placed referral to Neurology. He will need to be seen in 1 week to follow up. If you do not hear back  in 2 days to schedule an appointment please call the number listed on your discharge paper work.  Make sure to drink plenty of fluids to help with cramping. May also place heat to the area. If you notice one of his legs becomes more swollen than the other seek reevaluation.  If you notice that his speech worsens, he has unilateral numbness please seek reevaluation

## 2020-09-07 NOTE — ED Notes (Signed)
Provider made aware of new onset of unsteady gait, pt neuro remains at baseline.

## 2020-09-10 LAB — CULTURE, BLOOD (SINGLE)
Culture: NO GROWTH
Special Requests: ADEQUATE

## 2020-10-20 ENCOUNTER — Inpatient Hospital Stay (HOSPITAL_COMMUNITY)
Admission: EM | Admit: 2020-10-20 | Discharge: 2020-11-15 | DRG: 326 | Disposition: A | Discharge status: Rehabilitation Facility | Payer: Medicare Other | Attending: Internal Medicine | Admitting: Internal Medicine

## 2020-10-20 ENCOUNTER — Emergency Department (HOSPITAL_COMMUNITY): Payer: Medicare Other

## 2020-10-20 ENCOUNTER — Encounter (HOSPITAL_COMMUNITY): Payer: Self-pay

## 2020-10-20 ENCOUNTER — Other Ambulatory Visit: Payer: Self-pay

## 2020-10-20 DIAGNOSIS — A419 Sepsis, unspecified organism: Secondary | ICD-10-CM | POA: Diagnosis not present

## 2020-10-20 DIAGNOSIS — Z20822 Contact with and (suspected) exposure to covid-19: Secondary | ICD-10-CM | POA: Diagnosis present

## 2020-10-20 DIAGNOSIS — F209 Schizophrenia, unspecified: Secondary | ICD-10-CM | POA: Diagnosis present

## 2020-10-20 DIAGNOSIS — K449 Diaphragmatic hernia without obstruction or gangrene: Secondary | ICD-10-CM

## 2020-10-20 DIAGNOSIS — G9341 Metabolic encephalopathy: Secondary | ICD-10-CM | POA: Diagnosis present

## 2020-10-20 DIAGNOSIS — Z9889 Other specified postprocedural states: Secondary | ICD-10-CM

## 2020-10-20 DIAGNOSIS — R5381 Other malaise: Secondary | ICD-10-CM | POA: Diagnosis present

## 2020-10-20 DIAGNOSIS — J9602 Acute respiratory failure with hypercapnia: Secondary | ICD-10-CM | POA: Diagnosis present

## 2020-10-20 DIAGNOSIS — N179 Acute kidney failure, unspecified: Secondary | ICD-10-CM | POA: Diagnosis not present

## 2020-10-20 DIAGNOSIS — J939 Pneumothorax, unspecified: Secondary | ICD-10-CM

## 2020-10-20 DIAGNOSIS — Z978 Presence of other specified devices: Secondary | ICD-10-CM

## 2020-10-20 DIAGNOSIS — J9811 Atelectasis: Secondary | ICD-10-CM | POA: Diagnosis not present

## 2020-10-20 DIAGNOSIS — Z781 Physical restraint status: Secondary | ICD-10-CM

## 2020-10-20 DIAGNOSIS — Z79899 Other long term (current) drug therapy: Secondary | ICD-10-CM

## 2020-10-20 DIAGNOSIS — J9383 Other pneumothorax: Secondary | ICD-10-CM | POA: Diagnosis not present

## 2020-10-20 DIAGNOSIS — R1310 Dysphagia, unspecified: Secondary | ICD-10-CM | POA: Diagnosis present

## 2020-10-20 DIAGNOSIS — Z8701 Personal history of pneumonia (recurrent): Secondary | ICD-10-CM

## 2020-10-20 DIAGNOSIS — K2289 Other specified disease of esophagus: Secondary | ICD-10-CM | POA: Diagnosis present

## 2020-10-20 DIAGNOSIS — W19XXXA Unspecified fall, initial encounter: Secondary | ICD-10-CM | POA: Diagnosis present

## 2020-10-20 DIAGNOSIS — E785 Hyperlipidemia, unspecified: Secondary | ICD-10-CM | POA: Diagnosis present

## 2020-10-20 DIAGNOSIS — J158 Pneumonia due to other specified bacteria: Secondary | ICD-10-CM | POA: Diagnosis not present

## 2020-10-20 DIAGNOSIS — G9389 Other specified disorders of brain: Secondary | ICD-10-CM | POA: Diagnosis present

## 2020-10-20 DIAGNOSIS — Y838 Other surgical procedures as the cause of abnormal reaction of the patient, or of later complication, without mention of misadventure at the time of the procedure: Secondary | ICD-10-CM | POA: Diagnosis not present

## 2020-10-20 DIAGNOSIS — E871 Hypo-osmolality and hyponatremia: Secondary | ICD-10-CM | POA: Diagnosis present

## 2020-10-20 DIAGNOSIS — J398 Other specified diseases of upper respiratory tract: Secondary | ICD-10-CM | POA: Diagnosis present

## 2020-10-20 DIAGNOSIS — N5089 Other specified disorders of the male genital organs: Secondary | ICD-10-CM | POA: Diagnosis not present

## 2020-10-20 DIAGNOSIS — D509 Iron deficiency anemia, unspecified: Secondary | ICD-10-CM | POA: Diagnosis present

## 2020-10-20 DIAGNOSIS — Z79890 Hormone replacement therapy (postmenopausal): Secondary | ICD-10-CM

## 2020-10-20 DIAGNOSIS — Z8719 Personal history of other diseases of the digestive system: Secondary | ICD-10-CM

## 2020-10-20 DIAGNOSIS — K56609 Unspecified intestinal obstruction, unspecified as to partial versus complete obstruction: Secondary | ICD-10-CM

## 2020-10-20 DIAGNOSIS — J690 Pneumonitis due to inhalation of food and vomit: Secondary | ICD-10-CM | POA: Diagnosis not present

## 2020-10-20 DIAGNOSIS — E039 Hypothyroidism, unspecified: Secondary | ICD-10-CM | POA: Diagnosis present

## 2020-10-20 DIAGNOSIS — Q399 Congenital malformation of esophagus, unspecified: Secondary | ICD-10-CM

## 2020-10-20 DIAGNOSIS — D6489 Other specified anemias: Secondary | ICD-10-CM | POA: Diagnosis present

## 2020-10-20 DIAGNOSIS — I10 Essential (primary) hypertension: Secondary | ICD-10-CM | POA: Diagnosis present

## 2020-10-20 DIAGNOSIS — K2270 Barrett's esophagus without dysplasia: Secondary | ICD-10-CM | POA: Diagnosis present

## 2020-10-20 DIAGNOSIS — I69354 Hemiplegia and hemiparesis following cerebral infarction affecting left non-dominant side: Secondary | ICD-10-CM

## 2020-10-20 DIAGNOSIS — J9601 Acute respiratory failure with hypoxia: Secondary | ICD-10-CM

## 2020-10-20 DIAGNOSIS — D638 Anemia in other chronic diseases classified elsewhere: Secondary | ICD-10-CM | POA: Diagnosis present

## 2020-10-20 DIAGNOSIS — J9690 Respiratory failure, unspecified, unspecified whether with hypoxia or hypercapnia: Secondary | ICD-10-CM

## 2020-10-20 DIAGNOSIS — Z23 Encounter for immunization: Secondary | ICD-10-CM

## 2020-10-20 DIAGNOSIS — R066 Hiccough: Secondary | ICD-10-CM | POA: Diagnosis present

## 2020-10-20 DIAGNOSIS — J150 Pneumonia due to Klebsiella pneumoniae: Secondary | ICD-10-CM | POA: Diagnosis not present

## 2020-10-20 DIAGNOSIS — I69322 Dysarthria following cerebral infarction: Secondary | ICD-10-CM

## 2020-10-20 DIAGNOSIS — E119 Type 2 diabetes mellitus without complications: Secondary | ICD-10-CM

## 2020-10-20 DIAGNOSIS — K3184 Gastroparesis: Secondary | ICD-10-CM | POA: Diagnosis not present

## 2020-10-20 DIAGNOSIS — Z452 Encounter for adjustment and management of vascular access device: Secondary | ICD-10-CM

## 2020-10-20 DIAGNOSIS — J95851 Ventilator associated pneumonia: Secondary | ICD-10-CM | POA: Diagnosis not present

## 2020-10-20 DIAGNOSIS — E860 Dehydration: Secondary | ICD-10-CM | POA: Diagnosis present

## 2020-10-20 DIAGNOSIS — E2740 Unspecified adrenocortical insufficiency: Secondary | ICD-10-CM | POA: Diagnosis present

## 2020-10-20 DIAGNOSIS — E1165 Type 2 diabetes mellitus with hyperglycemia: Secondary | ICD-10-CM | POA: Diagnosis not present

## 2020-10-20 DIAGNOSIS — R001 Bradycardia, unspecified: Secondary | ICD-10-CM | POA: Diagnosis not present

## 2020-10-20 DIAGNOSIS — K219 Gastro-esophageal reflux disease without esophagitis: Secondary | ICD-10-CM | POA: Diagnosis present

## 2020-10-20 DIAGNOSIS — Y848 Other medical procedures as the cause of abnormal reaction of the patient, or of later complication, without mention of misadventure at the time of the procedure: Secondary | ICD-10-CM | POA: Diagnosis not present

## 2020-10-20 DIAGNOSIS — R55 Syncope and collapse: Secondary | ICD-10-CM | POA: Diagnosis present

## 2020-10-20 DIAGNOSIS — E1143 Type 2 diabetes mellitus with diabetic autonomic (poly)neuropathy: Secondary | ICD-10-CM | POA: Diagnosis present

## 2020-10-20 DIAGNOSIS — Z4659 Encounter for fitting and adjustment of other gastrointestinal appliance and device: Secondary | ICD-10-CM

## 2020-10-20 DIAGNOSIS — G40909 Epilepsy, unspecified, not intractable, without status epilepticus: Secondary | ICD-10-CM | POA: Diagnosis present

## 2020-10-20 DIAGNOSIS — R6521 Severe sepsis with septic shock: Secondary | ICD-10-CM | POA: Diagnosis not present

## 2020-10-20 DIAGNOSIS — Z881 Allergy status to other antibiotic agents status: Secondary | ICD-10-CM

## 2020-10-20 DIAGNOSIS — K317 Polyp of stomach and duodenum: Secondary | ICD-10-CM

## 2020-10-20 DIAGNOSIS — R000 Tachycardia, unspecified: Secondary | ICD-10-CM

## 2020-10-20 DIAGNOSIS — R059 Cough, unspecified: Secondary | ICD-10-CM

## 2020-10-20 DIAGNOSIS — R599 Enlarged lymph nodes, unspecified: Secondary | ICD-10-CM | POA: Diagnosis present

## 2020-10-20 DIAGNOSIS — R0600 Dyspnea, unspecified: Secondary | ICD-10-CM

## 2020-10-20 DIAGNOSIS — F79 Unspecified intellectual disabilities: Secondary | ICD-10-CM | POA: Diagnosis present

## 2020-10-20 DIAGNOSIS — R6250 Unspecified lack of expected normal physiological development in childhood: Secondary | ICD-10-CM | POA: Diagnosis present

## 2020-10-20 HISTORY — DX: Essential (primary) hypertension: I10

## 2020-10-20 LAB — COMPREHENSIVE METABOLIC PANEL
ALT: 44 U/L (ref 0–44)
AST: 58 U/L — ABNORMAL HIGH (ref 15–41)
Albumin: 3.2 g/dL — ABNORMAL LOW (ref 3.5–5.0)
Alkaline Phosphatase: 57 U/L (ref 38–126)
Anion gap: 13 (ref 5–15)
BUN: 37 mg/dL — ABNORMAL HIGH (ref 8–23)
CO2: 19 mmol/L — ABNORMAL LOW (ref 22–32)
Calcium: 8.3 mg/dL — ABNORMAL LOW (ref 8.9–10.3)
Chloride: 104 mmol/L (ref 98–111)
Creatinine, Ser: 1.7 mg/dL — ABNORMAL HIGH (ref 0.61–1.24)
GFR, Estimated: 45 mL/min — ABNORMAL LOW (ref >60)
Glucose, Bld: 163 mg/dL — ABNORMAL HIGH (ref 70–99)
Potassium: 4.5 mmol/L (ref 3.5–5.1)
Sodium: 136 mmol/L (ref 135–145)
Total Bilirubin: 0.4 mg/dL (ref 0.3–1.2)
Total Protein: 6.2 g/dL — ABNORMAL LOW (ref 6.5–8.1)

## 2020-10-20 LAB — TRIGLYCERIDES: Triglycerides: 103 mg/dL (ref <150)

## 2020-10-20 LAB — RESPIRATORY PANEL BY RT PCR (FLU A&B, COVID)
Influenza A by PCR: NEGATIVE
Influenza B by PCR: NEGATIVE
SARS Coronavirus 2 by RT PCR: NEGATIVE

## 2020-10-20 LAB — C-REACTIVE PROTEIN: CRP: 1.2 mg/dL — ABNORMAL HIGH (ref <1.0)

## 2020-10-20 LAB — D-DIMER, QUANTITATIVE: D-Dimer, Quant: 0.62 ug/mL-FEU — ABNORMAL HIGH (ref 0.00–0.50)

## 2020-10-20 LAB — LACTATE DEHYDROGENASE: LDH: 259 U/L — ABNORMAL HIGH (ref 98–192)

## 2020-10-20 LAB — FIBRINOGEN: Fibrinogen: 203 mg/dL — ABNORMAL LOW (ref 210–475)

## 2020-10-20 LAB — PROCALCITONIN: Procalcitonin: 0.11 ng/mL

## 2020-10-20 LAB — FERRITIN: Ferritin: 15 ng/mL — ABNORMAL LOW (ref 24–336)

## 2020-10-20 LAB — LACTIC ACID, PLASMA: Lactic Acid, Venous: 1.5 mmol/L (ref 0.5–1.9)

## 2020-10-20 MED ORDER — IOHEXOL 350 MG/ML SOLN
75.0000 mL | Freq: Once | INTRAVENOUS | Status: AC | PRN
  Administered 2020-10-20: 75 mL via INTRAVENOUS

## 2020-10-20 NOTE — ED Triage Notes (Signed)
Pt BIB [facility] from Group Home (EMS unsure of name)  Pt fell once at Group Home (unwitnessed) and once staff assisted him to his feet he had a syncopal episode with the staff.   Upon EMS arrival pt was awake but had another syncopal episode in which he became diaphoretic and pale.   VSS: 140/68 HR originally 130, now 120 RA 88% O2 CBG 255  C-Collar in Place  Hx of Stroke with L. Sided Deficits and Dysphagia. Currently GCS 15, A&Ox4

## 2020-10-20 NOTE — ED Provider Notes (Signed)
[facility] [facility] EMERGENCY DEPARTMENT Provider Note  CSN: [id number] Arrival date & time: 10/20/20 2100    History Chief Complaint  Patient presents with  . Loss of Consciousness    HPI  [name] is a 64 y.o. male with history of prior stroke, MR and seizures who lives at a group home. His history is difficult due to residual speech disturbance. Per EMS he had an unwitnessed fall at group home and then a witnessed syncopal episode with staff there. He had another episode with EMS reported to be pale and diaphoretic. Patient also found to by hypoxic, he tells me that he wears O2 at home.    Past Medical History:  Diagnosis Date  . Barrett esophagus   . Dysphagia   . Gastroparesis   . GERD (gastroesophageal reflux disease)   . Hypercholesteremia   . Iron deficiency anemia   . Mental retardation   . Schizophrenia (HCC)   . Seizure [facility])     Past Surgical History:  Procedure Laterality Date  . CRANIOTOMY FOR CYST FENESTRATION    . ESOPHAGOGASTRODUODENOSCOPY N/A 01/09/2015   Procedure: ESOPHAGOGASTRODUODENOSCOPY (EGD);  Surgeon: Louis Meckel, MD;  Location: [facility] ENDOSCOPY;  Service: Endoscopy;  Laterality: N/A;    No family history on file.  Social History   Tobacco Use  . Smoking status: Never Smoker  . Smokeless tobacco: Never Used  Substance Use Topics  . Alcohol use: No  . Drug use: No     Home Medications Prior to Admission medications   Medication Sig Start Date End Date Taking? Authorizing Provider  benazepril (LOTENSIN) 20 MG tablet Take 20 mg by mouth daily.    [provider]  Cholecalciferol (VITAMIN D) 2000 units tablet Take 2,000 Units by mouth daily.    [provider]  divalproex (DEPAKOTE ER) 500 MG 24 hr tablet Take 500 mg by mouth daily.    [provider]  esomeprazole (NEXIUM) 40 MG capsule Take 40 mg by mouth in the morning and at bedtime.    [provider]  lamoTRIgine (LAMICTAL) 100 MG tablet  Take 100 mg by mouth daily.    [provider]  levothyroxine (SYNTHROID, LEVOTHROID) 75 MCG tablet Take 75 mcg by mouth daily.    [provider]  metoCLOPramide (REGLAN) 5 MG tablet Take 1 tablet (5 mg total) by mouth 3 (three) times daily before meals. Patient taking differently: Take 5 mg by mouth in the morning and at bedtime.  07/27/15   Regalado, Belkys A, MD  propranolol (INDERAL) 10 MG tablet Take 10 mg by mouth daily. 03/31/18   [provider]  QUEtiapine (SEROQUEL) 400 MG tablet Take 400 mg by mouth 2 (two) times daily.     [provider]  rosuvastatin (CRESTOR) 20 MG tablet Take 20 mg by mouth daily.    [provider]     Allergies    Vancomycin   Review of Systems   Review of Systems Unable to assess due to speech disturbance  Physical Exam BP 111/68   Pulse (!) 115   Temp 99.8 F (37.7 C) (Oral)   Resp (!) 40   Ht 5\' 3"  (1.6 m)   Wt 77 kg   SpO2 98%   BMI 30.07 kg/m   Physical Exam Vitals and nursing note reviewed.  Constitutional:      Appearance: Normal appearance.  HENT:     Head: Normocephalic and atraumatic.     Nose: Nose normal.  Mouth/Throat:     Mouth: Mucous membranes are moist.  Eyes:     Extraocular Movements: Extraocular movements intact.     Conjunctiva/sclera: Conjunctivae normal.  Neck:     Comments: C-collar removed Cardiovascular:     Rate and Rhythm: Tachycardia present.  Pulmonary:     Effort: Pulmonary effort is normal.     Breath sounds: Normal breath sounds.     Comments: tachypnea Abdominal:     General: Abdomen is flat.     Palpations: Abdomen is soft.     Tenderness: There is no abdominal tenderness.  Musculoskeletal:        General: No swelling. Normal range of motion.     Cervical back: Neck supple. No tenderness.  Skin:    General: Skin is warm and dry.  Neurological:     Mental Status: He is alert.     Comments: Dysarthria, L sided hemiplegia from prior stroke,  normal strength on R  Psychiatric:        Mood and Affect: Mood normal.      ED Results / Procedures / Treatments   Labs (all labs ordered are listed, but only abnormal results are displayed) Labs Reviewed  COMPREHENSIVE METABOLIC PANEL - Abnormal; Notable for the following components:      Result Value   CO2 19 (*)    Glucose, Bld 163 (*)    BUN 37 (*)    Creatinine, Ser 1.70 (*)    Calcium 8.3 (*)    Total Protein 6.2 (*)    Albumin 3.2 (*)    AST 58 (*)    GFR, Estimated 45 (*)    All other components within normal limits  D-DIMER, QUANTITATIVE (NOT AT [facility]) - Abnormal; Notable for the following components:   D-Dimer, Quant 0.62 (*)    All other components within normal limits  LACTATE DEHYDROGENASE - Abnormal; Notable for the following components:   LDH 259 (*)    All other components within normal limits  FIBRINOGEN - Abnormal; Notable for the following components:   Fibrinogen 203 (*)    All other components within normal limits  RESPIRATORY PANEL BY RT PCR (FLU A&B, COVID)  CULTURE, BLOOD (ROUTINE X 2)  CULTURE, BLOOD (ROUTINE X 2)  LACTIC ACID, PLASMA  TRIGLYCERIDES  LACTIC ACID, PLASMA  CBC WITH DIFFERENTIAL/PLATELET  PROCALCITONIN  FERRITIN  C-REACTIVE PROTEIN  CBC WITH DIFFERENTIAL/PLATELET    EKG EKG Interpretation  Date/Time:  Thursday October 20 2020 21:17:48 EDT Ventricular Rate:  123 PR Interval:    QRS Duration: 89 QT Interval:  305 QTC Calculation: 437 R Axis:   138 Text Interpretation: Sinus tachycardia Probable left atrial enlargement Right axis deviation Borderline T wave abnormalities Since last tracing Rate faster Confirmed by Susy Frizzle [phone]) on 10/20/2020 9:57:23 PM   Radiology DG Chest Port 1 View  Result Date: 10/20/2020 CLINICAL DATA:  Hypoxia and cough. EXAM: PORTABLE CHEST 1 VIEW COMPARISON:  September 05, 2020 FINDINGS: Low lung volumes are seen which is likely, in part, secondary to the degree of patient  inspiration. There is no evidence of acute infiltrate, pleural effusion or pneumothorax. The heart size and mediastinal contours are within normal limits. A markedly distended and tortuous air-filled esophagus is seen. Marked severity gastric distension is also present. The visualized skeletal structures are unremarkable. IMPRESSION: 1. Low lung volumes without evidence of acute infiltrate. 2. Marked severity gastric and esophageal distention. Sequelae associated with gastric outlet or small bowel obstruction cannot be excluded. Correlation with  abdomen pelvis CT is recommended. Electronically Signed   By: Aram Candela M.D.   On: 10/20/2020 21:44    Procedures Procedures  Medications Ordered in the ED Medications - No data to display   MDM Rules/Calculators/A&P MDM Patient with syncope in setting of tachypnea and tachycardia with questionable hypoxia. Consider Covid breakthrough, PE, CAP. Will check labs, CXR and plan CTA.  ED Course  I have reviewed the triage vital signs and the nursing notes.  Pertinent labs & imaging results that were available during my care of the patient were reviewed by me and considered in my medical decision making (see chart for details).  Clinical Course as of Oct 20 2313  Thu Oct 20, 2020  2256 CMP with mild elevated in Creatinine, Dimer, Fibrinogen and LDH also mildly elevated. Covid is negative.    [CS]  2257 Lactic acid is neg.    [CS]  2311 Care of the patient signed out to Dr. Clayborne Dana, at the change of shift.    [CS]    Clinical Course User Index [CS] Pollyann Savoy, MD    Final Clinical Impression(s) / ED Diagnoses Final diagnoses:  None    Rx / DC Orders ED Discharge Orders    None       Pollyann Savoy, MD 10/20/20 2315

## 2020-10-20 NOTE — ED Notes (Signed)
Please call [name]) @ [phone]--for a status update--Carleta Woodrow

## 2020-10-21 ENCOUNTER — Inpatient Hospital Stay (HOSPITAL_COMMUNITY): Payer: Medicare Other

## 2020-10-21 ENCOUNTER — Encounter (HOSPITAL_COMMUNITY): Payer: Self-pay | Admitting: Internal Medicine

## 2020-10-21 ENCOUNTER — Inpatient Hospital Stay (HOSPITAL_COMMUNITY): Payer: Medicare Other | Admitting: Certified Registered"

## 2020-10-21 ENCOUNTER — Other Ambulatory Visit: Payer: Self-pay

## 2020-10-21 ENCOUNTER — Encounter (HOSPITAL_COMMUNITY)
Admission: EM | Disposition: A | Discharge status: Rehabilitation Facility | Payer: Self-pay | Source: Home / Self Care | Attending: Internal Medicine

## 2020-10-21 DIAGNOSIS — K2289 Other specified disease of esophagus: Secondary | ICD-10-CM | POA: Diagnosis present

## 2020-10-21 DIAGNOSIS — E2740 Unspecified adrenocortical insufficiency: Secondary | ICD-10-CM | POA: Diagnosis present

## 2020-10-21 DIAGNOSIS — R1310 Dysphagia, unspecified: Secondary | ICD-10-CM | POA: Diagnosis not present

## 2020-10-21 DIAGNOSIS — A419 Sepsis, unspecified organism: Secondary | ICD-10-CM | POA: Diagnosis not present

## 2020-10-21 DIAGNOSIS — K317 Polyp of stomach and duodenum: Secondary | ICD-10-CM | POA: Diagnosis not present

## 2020-10-21 DIAGNOSIS — R9431 Abnormal electrocardiogram [ECG] [EKG]: Secondary | ICD-10-CM | POA: Diagnosis not present

## 2020-10-21 DIAGNOSIS — K449 Diaphragmatic hernia without obstruction or gangrene: Principal | ICD-10-CM

## 2020-10-21 DIAGNOSIS — J95851 Ventilator associated pneumonia: Secondary | ICD-10-CM | POA: Diagnosis not present

## 2020-10-21 DIAGNOSIS — Y848 Other medical procedures as the cause of abnormal reaction of the patient, or of later complication, without mention of misadventure at the time of the procedure: Secondary | ICD-10-CM | POA: Diagnosis not present

## 2020-10-21 DIAGNOSIS — Y838 Other surgical procedures as the cause of abnormal reaction of the patient, or of later complication, without mention of misadventure at the time of the procedure: Secondary | ICD-10-CM | POA: Diagnosis not present

## 2020-10-21 DIAGNOSIS — D509 Iron deficiency anemia, unspecified: Secondary | ICD-10-CM | POA: Diagnosis not present

## 2020-10-21 DIAGNOSIS — J150 Pneumonia due to Klebsiella pneumoniae: Secondary | ICD-10-CM | POA: Diagnosis not present

## 2020-10-21 DIAGNOSIS — M7989 Other specified soft tissue disorders: Secondary | ICD-10-CM | POA: Diagnosis not present

## 2020-10-21 DIAGNOSIS — R55 Syncope and collapse: Secondary | ICD-10-CM

## 2020-10-21 DIAGNOSIS — R5381 Other malaise: Secondary | ICD-10-CM | POA: Diagnosis not present

## 2020-10-21 DIAGNOSIS — Q399 Congenital malformation of esophagus, unspecified: Secondary | ICD-10-CM

## 2020-10-21 DIAGNOSIS — R000 Tachycardia, unspecified: Secondary | ICD-10-CM | POA: Diagnosis not present

## 2020-10-21 DIAGNOSIS — K56699 Other intestinal obstruction unspecified as to partial versus complete obstruction: Secondary | ICD-10-CM | POA: Diagnosis not present

## 2020-10-21 DIAGNOSIS — R609 Edema, unspecified: Secondary | ICD-10-CM | POA: Diagnosis not present

## 2020-10-21 DIAGNOSIS — K458 Other specified abdominal hernia without obstruction or gangrene: Secondary | ICD-10-CM | POA: Diagnosis not present

## 2020-10-21 DIAGNOSIS — I69322 Dysarthria following cerebral infarction: Secondary | ICD-10-CM | POA: Diagnosis not present

## 2020-10-21 DIAGNOSIS — Z23 Encounter for immunization: Secondary | ICD-10-CM | POA: Diagnosis not present

## 2020-10-21 DIAGNOSIS — K3184 Gastroparesis: Secondary | ICD-10-CM | POA: Diagnosis not present

## 2020-10-21 DIAGNOSIS — E119 Type 2 diabetes mellitus without complications: Secondary | ICD-10-CM | POA: Diagnosis not present

## 2020-10-21 DIAGNOSIS — J690 Pneumonitis due to inhalation of food and vomit: Secondary | ICD-10-CM | POA: Diagnosis not present

## 2020-10-21 DIAGNOSIS — F79 Unspecified intellectual disabilities: Secondary | ICD-10-CM | POA: Diagnosis not present

## 2020-10-21 DIAGNOSIS — Z931 Gastrostomy status: Secondary | ICD-10-CM | POA: Diagnosis not present

## 2020-10-21 DIAGNOSIS — R569 Unspecified convulsions: Secondary | ICD-10-CM | POA: Diagnosis not present

## 2020-10-21 DIAGNOSIS — I69354 Hemiplegia and hemiparesis following cerebral infarction affecting left non-dominant side: Secondary | ICD-10-CM | POA: Diagnosis not present

## 2020-10-21 DIAGNOSIS — Z9889 Other specified postprocedural states: Secondary | ICD-10-CM | POA: Diagnosis not present

## 2020-10-21 DIAGNOSIS — G9341 Metabolic encephalopathy: Secondary | ICD-10-CM | POA: Diagnosis present

## 2020-10-21 DIAGNOSIS — K56609 Unspecified intestinal obstruction, unspecified as to partial versus complete obstruction: Secondary | ICD-10-CM | POA: Diagnosis not present

## 2020-10-21 DIAGNOSIS — K560 Paralytic ileus: Secondary | ICD-10-CM | POA: Diagnosis not present

## 2020-10-21 DIAGNOSIS — J9602 Acute respiratory failure with hypercapnia: Secondary | ICD-10-CM | POA: Diagnosis present

## 2020-10-21 DIAGNOSIS — G40909 Epilepsy, unspecified, not intractable, without status epilepticus: Secondary | ICD-10-CM | POA: Diagnosis present

## 2020-10-21 DIAGNOSIS — Z8719 Personal history of other diseases of the digestive system: Secondary | ICD-10-CM | POA: Diagnosis not present

## 2020-10-21 DIAGNOSIS — K2270 Barrett's esophagus without dysplasia: Secondary | ICD-10-CM | POA: Diagnosis not present

## 2020-10-21 DIAGNOSIS — Z20822 Contact with and (suspected) exposure to covid-19: Secondary | ICD-10-CM | POA: Diagnosis not present

## 2020-10-21 DIAGNOSIS — N179 Acute kidney failure, unspecified: Secondary | ICD-10-CM | POA: Diagnosis not present

## 2020-10-21 DIAGNOSIS — F201 Disorganized schizophrenia: Secondary | ICD-10-CM | POA: Diagnosis not present

## 2020-10-21 DIAGNOSIS — R1319 Other dysphagia: Secondary | ICD-10-CM | POA: Diagnosis not present

## 2020-10-21 DIAGNOSIS — J9811 Atelectasis: Secondary | ICD-10-CM | POA: Diagnosis not present

## 2020-10-21 DIAGNOSIS — W19XXXA Unspecified fall, initial encounter: Secondary | ICD-10-CM | POA: Diagnosis present

## 2020-10-21 DIAGNOSIS — J158 Pneumonia due to other specified bacteria: Secondary | ICD-10-CM | POA: Diagnosis not present

## 2020-10-21 DIAGNOSIS — E1165 Type 2 diabetes mellitus with hyperglycemia: Secondary | ICD-10-CM | POA: Diagnosis not present

## 2020-10-21 DIAGNOSIS — J9601 Acute respiratory failure with hypoxia: Secondary | ICD-10-CM

## 2020-10-21 DIAGNOSIS — E7800 Pure hypercholesterolemia, unspecified: Secondary | ICD-10-CM | POA: Diagnosis not present

## 2020-10-21 DIAGNOSIS — E871 Hypo-osmolality and hyponatremia: Secondary | ICD-10-CM | POA: Diagnosis not present

## 2020-10-21 DIAGNOSIS — F209 Schizophrenia, unspecified: Secondary | ICD-10-CM | POA: Diagnosis not present

## 2020-10-21 DIAGNOSIS — J9383 Other pneumothorax: Secondary | ICD-10-CM | POA: Diagnosis not present

## 2020-10-21 DIAGNOSIS — E039 Hypothyroidism, unspecified: Secondary | ICD-10-CM | POA: Diagnosis not present

## 2020-10-21 DIAGNOSIS — R6521 Severe sepsis with septic shock: Secondary | ICD-10-CM | POA: Diagnosis not present

## 2020-10-21 DIAGNOSIS — E785 Hyperlipidemia, unspecified: Secondary | ICD-10-CM | POA: Diagnosis not present

## 2020-10-21 HISTORY — PX: BIOPSY: SHX5522

## 2020-10-21 HISTORY — DX: Syncope and collapse: R55

## 2020-10-21 HISTORY — PX: ESOPHAGOGASTRODUODENOSCOPY (EGD) WITH PROPOFOL: SHX5813

## 2020-10-21 HISTORY — DX: Acute respiratory failure with hypoxia: J96.01

## 2020-10-21 LAB — CBC WITH DIFFERENTIAL/PLATELET
Abs Immature Granulocytes: 0.02 10*3/uL (ref 0.00–0.07)
Basophils Absolute: 0 10*3/uL (ref 0.0–0.1)
Basophils Relative: 0 %
Eosinophils Absolute: 0 10*3/uL (ref 0.0–0.5)
Eosinophils Relative: 0 %
HCT: 28.1 % — ABNORMAL LOW (ref 39.0–52.0)
Hemoglobin: 8.2 g/dL — ABNORMAL LOW (ref 13.0–17.0)
Immature Granulocytes: 0 %
Lymphocytes Relative: 16 %
Lymphs Abs: 1 10*3/uL (ref 0.7–4.0)
MCH: 25.5 pg — ABNORMAL LOW (ref 26.0–34.0)
MCHC: 29.2 g/dL — ABNORMAL LOW (ref 30.0–36.0)
MCV: 87.5 fL (ref 80.0–100.0)
Monocytes Absolute: 0.7 10*3/uL (ref 0.1–1.0)
Monocytes Relative: 12 %
Neutro Abs: 4.6 10*3/uL (ref 1.7–7.7)
Neutrophils Relative %: 72 %
Platelets: 231 10*3/uL (ref 150–400)
RBC: 3.21 MIL/uL — ABNORMAL LOW (ref 4.22–5.81)
RDW: 16.2 % — ABNORMAL HIGH (ref 11.5–15.5)
WBC: 6.5 10*3/uL (ref 4.0–10.5)
nRBC: 0 % (ref 0.0–0.2)

## 2020-10-21 LAB — ECHOCARDIOGRAM COMPLETE
Area-P 1/2: 4.49 cm2
Height: 63 in
S' Lateral: 2.7 cm
Single Plane A4C EF: 71.3 %
Weight: 2716.07 oz

## 2020-10-21 LAB — BASIC METABOLIC PANEL
Anion gap: 7 (ref 5–15)
BUN: 27 mg/dL — ABNORMAL HIGH (ref 8–23)
CO2: 23 mmol/L (ref 22–32)
Calcium: 7.1 mg/dL — ABNORMAL LOW (ref 8.9–10.3)
Chloride: 111 mmol/L (ref 98–111)
Creatinine, Ser: 1.2 mg/dL (ref 0.61–1.24)
GFR, Estimated: >60 mL/min (ref >60)
Glucose, Bld: 112 mg/dL — ABNORMAL HIGH (ref 70–99)
Potassium: 3.6 mmol/L (ref 3.5–5.1)
Sodium: 141 mmol/L (ref 135–145)

## 2020-10-21 LAB — LACTIC ACID, PLASMA: Lactic Acid, Venous: 1.4 mmol/L (ref 0.5–1.9)

## 2020-10-21 LAB — TROPONIN I (HIGH SENSITIVITY)
Troponin I (High Sensitivity): 23 ng/L — ABNORMAL HIGH (ref <18)
Troponin I (High Sensitivity): 27 ng/L — ABNORMAL HIGH (ref <18)

## 2020-10-21 LAB — ALBUMIN: Albumin: 2.6 g/dL — ABNORMAL LOW (ref 3.5–5.0)

## 2020-10-21 LAB — TSH: TSH: 3.119 u[IU]/mL (ref 0.350–4.500)

## 2020-10-21 SURGERY — ESOPHAGOGASTRODUODENOSCOPY (EGD) WITH PROPOFOL
Anesthesia: Monitor Anesthesia Care

## 2020-10-21 MED ORDER — PHENYLEPHRINE 40 MCG/ML (10ML) SYRINGE FOR IV PUSH (FOR BLOOD PRESSURE SUPPORT)
PREFILLED_SYRINGE | INTRAVENOUS | Status: DC | PRN
  Administered 2020-10-21: 200 ug via INTRAVENOUS

## 2020-10-21 MED ORDER — SODIUM CHLORIDE 0.9 % IV BOLUS
1000.0000 mL | Freq: Once | INTRAVENOUS | Status: AC
  Administered 2020-10-21: 1000 mL via INTRAVENOUS

## 2020-10-21 MED ORDER — PERFLUTREN LIPID MICROSPHERE
1.0000 mL | INTRAVENOUS | Status: DC | PRN
  Administered 2020-10-21: 2 mL via INTRAVENOUS
  Filled 2020-10-21: qty 10

## 2020-10-21 MED ORDER — VALPROATE SODIUM 500 MG/5ML IV SOLN
250.0000 mg | Freq: Two times a day (BID) | INTRAVENOUS | Status: DC
  Administered 2020-10-21 – 2020-10-23 (×6): 250 mg via INTRAVENOUS
  Filled 2020-10-21 (×12): qty 2.5

## 2020-10-21 MED ORDER — KCL-LACTATED RINGERS-D5W 20 MEQ/L IV SOLN
INTRAVENOUS | Status: DC
  Filled 2020-10-21 (×3): qty 1000

## 2020-10-21 MED ORDER — LORAZEPAM 2 MG/ML IJ SOLN
1.0000 mg | Freq: Once | INTRAMUSCULAR | Status: AC
  Administered 2020-10-21: 1 mg via INTRAVENOUS
  Filled 2020-10-21: qty 1

## 2020-10-21 MED ORDER — PANTOPRAZOLE SODIUM 40 MG IV SOLR
40.0000 mg | INTRAVENOUS | Status: DC
  Administered 2020-10-21 – 2020-10-23 (×3): 40 mg via INTRAVENOUS
  Filled 2020-10-21 (×3): qty 40

## 2020-10-21 MED ORDER — LACTATED RINGERS IV SOLN: INTRAVENOUS | Status: DC | PRN

## 2020-10-21 MED ORDER — ONDANSETRON HCL 4 MG/2ML IJ SOLN
4.0000 mg | Freq: Four times a day (QID) | INTRAMUSCULAR | Status: DC | PRN
  Administered 2020-10-21: 4 mg via INTRAVENOUS
  Filled 2020-10-21: qty 2

## 2020-10-21 MED ORDER — METOPROLOL TARTRATE 5 MG/5ML IV SOLN
2.5000 mg | Freq: Three times a day (TID) | INTRAVENOUS | Status: DC
  Administered 2020-10-21 – 2020-10-22 (×3): 2.5 mg via INTRAVENOUS
  Filled 2020-10-21 (×4): qty 5

## 2020-10-21 MED ORDER — PROPOFOL 10 MG/ML IV BOLUS
INTRAVENOUS | Status: DC | PRN
  Administered 2020-10-21: 20 mg via INTRAVENOUS

## 2020-10-21 MED ORDER — SODIUM CHLORIDE 0.9 % IV SOLN: INTRAVENOUS | Status: DC

## 2020-10-21 MED ORDER — SODIUM CHLORIDE 0.9 % IV SOLN
3.0000 g | Freq: Four times a day (QID) | INTRAVENOUS | Status: DC
  Administered 2020-10-21 – 2020-10-24 (×13): 3 g via INTRAVENOUS
  Filled 2020-10-21 (×5): qty 8
  Filled 2020-10-21: qty 3
  Filled 2020-10-21 (×8): qty 8
  Filled 2020-10-21: qty 3
  Filled 2020-10-21: qty 8

## 2020-10-21 MED ORDER — ALBUTEROL SULFATE (2.5 MG/3ML) 0.083% IN NEBU
2.5000 mg | INHALATION_SOLUTION | RESPIRATORY_TRACT | Status: DC | PRN
  Administered 2020-10-21: 2.5 mg via RESPIRATORY_TRACT
  Filled 2020-10-21: qty 3

## 2020-10-21 MED ORDER — PROPOFOL 500 MG/50ML IV EMUL
INTRAVENOUS | Status: DC | PRN
  Administered 2020-10-21: 100 ug/kg/min via INTRAVENOUS

## 2020-10-21 SURGICAL SUPPLY — 15 items

## 2020-10-21 NOTE — Progress Notes (Signed)
OT Cancellation Note  Patient Details Name: [name] MRN: [id number] DOB: 03/18/1956   Cancelled Treatment:    Reason Eval/Treat Not Completed: Patient at procedure or test/ unavailable Pt OTF for test and unavailable, however RN reports pt is not yet medically ready for OT eval this date. Will hold until available and appropriate to initiate OT POC.  Dalphine Handing, MSOT, OTR/L Acute Rehabilitation Services [facility] Office Number: [phone] Pager: [phone]  Dalphine Handing 10/21/2020, 11:03 AM

## 2020-10-21 NOTE — Procedures (Addendum)
Patient Name: [name]  MRN: [id number]  Epilepsy Attending: Charlsie Quest  Referring Physician/Provider: Dr Eligha Bridegroom Date: 10/21/2020 Duration: 25.58 mins  Patient history: 64 year old male with syncope.  EEG to evaluate for seizures.  Level of alertness: Awake, drowsy  AEDs during EEG study: VPA  Technical aspects: This EEG study was done with scalp electrodes positioned according to the 10-20 International system of electrode placement. Electrical activity was acquired at a sampling rate of 500Hz  and reviewed with a high frequency filter of 70Hz  and a low frequency filter of 1Hz . EEG data were recorded continuously and digitally stored.   Description: The posterior dominant rhythm consists of 6.5 Hz activity of moderate voltage (25-35 uV) seen predominantly in posterior head regions, symmetric and reactive to eye opening and eye closing.  Drowsiness is characterized by attenuation of the posterior dominant rhythm.  EEG showed continuous generalized 3 to 6 Hz theta-delta slowing. Physiologic photic driving was not seen during photic stimulation.  Hyperventilation was not performed.     ABNORMALITY -Continuous slow, generalized - Background slow  IMPRESSION: This study is suggestive of mild to moderate diffuse encephalopathy, nonspecific etiology. No seizures or epileptiform discharges were seen throughout the recording.  Ciela Mahajan 

## 2020-10-21 NOTE — Progress Notes (Signed)
Pharmacy Antibiotic Note  [name] is a 64 y.o. male admitted on 10/20/2020 with asp pna.  Pharmacy has been consulted for Unasyn dosing.  Plan: Unasyn 3gm IV q6h Will f/u renal function, micro data, and pt's clinical condition  Height: 5\' 3"  (160 cm) Weight: 77 kg (169 lb 12.1 oz) IBW/kg (Calculated) : 56.9  Temp (24hrs), Avg:99.8 F (37.7 C), Min:99.8 F (37.7 C), Max:99.8 F (37.7 C)  Recent Labs  Lab 10/20/20 2200 10/20/20 2228 10/20/20 2320 10/21/20 0327  CREATININE 1.70*  --   --  1.20  LATICACIDVEN  --  1.5 1.4  --     Estimated Creatinine Clearance: 58.6 mL/min (by C-G formula based on SCr of 1.2 mg/dL).    Allergies  Allergen Reactions  . Vancomycin Other (See Comments)    Unknown-possible hives     Antimicrobials this admission: 10/29 Unasyn >>   Microbiology results: 10/28 BCx:   Thank you for allowing pharmacy to be a part of this patient's care.  11/28, PharmD, BCPS Please see amion for complete clinical pharmacist phone list 10/21/2020 4:29 AM

## 2020-10-21 NOTE — Consult Note (Signed)
NAME:  [name], MRN:  [id number], DOB:  1956/04/23, LOS: 0 ADMISSION DATE:  10/20/2020, CONSULTATION DATE:  10/29 REFERRING MD:  Dr. Loney Loh, CHIEF COMPLAINT:  Dyspnea   Brief History   64 year old male presented to ED after syncopal episode and was found to have large hiatal hernia compressing trachea.   History of present illness   64 year old male with PMH as below, which is significant for GERD, Barrett esophagus, HTN, stroke with L sided deficits, and schizophrenia. History of intellectual disability also reported. Patient is a difficult historian. He resides in group home where he had a witnessed episode of syncope and fall on 10/29 early AM. Syncopal with EMS as well. Upon arrival to the ED he was found to be tachycardic and tachypneic. Hypoxic to 88% on room air. Vomiting in ED as well. Imaging included CTA of the chest and CT abdomen demonstrating large hiatal hernia with associated esophageal dilation mass effect on the trachea. Given these findings with associated hypoxia and tachypnea, PCCM was consulted.   Past Medical History   has a past medical history of Barrett esophagus, Dysphagia, Gastroparesis, GERD (gastroesophageal reflux disease), Hypercholesteremia, Hypertension, Iron deficiency anemia, Mental retardation, Schizophrenia (HCC), and Seizure (HCC).  Significant Hospital Events   10/29 - admit  Consults:  PCCM CVTS  Procedures:    Significant Diagnostic Tests:  CTA chest 10/29 > no PE, RUL ATX CT abd/pelvis 10/29  > large, stable gastric hernia with associated esophageal dilation and subsequent mass effect on the trachea.    Micro Data:    Antimicrobials:     Interim history/subjective:    Objective   Blood pressure 120/90, pulse (!) 114, temperature 99.8 F (37.7 C), temperature source Oral, resp. rate (!) 42, height 5\' 3"  (1.6 m), weight 77 kg, SpO2 98 %.       No intake or output data in the 24 hours ending 10/21/20 0338 Filed Weights     10/20/20 2112  Weight: 77 kg    Examination: General: adult male in mild distress HENT: [place]/AT, PERRL, no JVD Lungs: Expiratory upper airway wheeze. Mildly tachypeneic.  Cardiovascular: Mildly tahcycardic, regular, no MRG Abdomen: Soft, non-tender, non-distended Extremities: No acute deformity Neuro: Alert, interactive.    Resolved Hospital Problem list     Assessment & Plan:   Acute respiratory failure with hypoxia: multifactorial in the setting of aspiration pneumonia and structural effects of hiatal hernia.  - Supplemental oxygen (currently stable on 3L) - No indication for intubation at this time - Not a BiPAP candidate due to vomiting - Aspiration pneumonia antibiotics coverage - CVTS consultation pending.  - Should his respiratory status decline, ICU transfer for intubation would be appropriate.  - PRN Zofran, Cough suppression.   Syncope AKI - management per primary   Best practice:  Diet: NPO Pain/Anxiety/Delirium protocol (if indicated): NA VAP protocol (if indicated): NA DVT prophylaxis: SCD GI prophylaxis: PPI Glucose control: NA Mobility: BR Code Status: FULL Family Communication: per primary Disposition:   Labs   CBC: No results for input(s): WBC, NEUTROABS, HGB, HCT, MCV, PLT in the last 168 hours.  Basic Metabolic Panel: Recent Labs  Lab 10/20/20 2200  NA 136  K 4.5  CL 104  CO2 19*  GLUCOSE 163*  BUN 37*  CREATININE 1.70*  CALCIUM 8.3*   GFR: Estimated Creatinine Clearance: 41.4 mL/min (A) (by C-G formula based on SCr of 1.7 mg/dL (H)). Recent Labs  Lab 10/20/20 2200 10/20/20 2228 10/20/20 2320  PROCALCITON 0.11  --   --   LATICACIDVEN  --  1.5 1.4    Liver Function Tests: Recent Labs  Lab 10/20/20 2200  AST 58*  ALT 44  ALKPHOS 57  BILITOT 0.4  PROT 6.2*  ALBUMIN 3.2*   No results for input(s): LIPASE, AMYLASE in the last 168 hours. No results for input(s): AMMONIA in the last 168 hours.  ABG    Component  Value Date/Time   PHART 7.409 10/06/2007 0500   PCO2ART 42.3 10/06/2007 0500   PO2ART 103.0 (H) 10/06/2007 0500   HCO3 26.2 (H) 10/06/2007 0500   TCO2 23 09/05/2020 1152   ACIDBASEDEF 1.0 10/05/2007 1720   O2SAT 97.3 10/06/2007 0500     Coagulation Profile: No results for input(s): INR, PROTIME in the last 168 hours.  Cardiac Enzymes: No results for input(s): CKTOTAL, CKMB, CKMBINDEX, TROPONINI in the last 168 hours.  HbA1C: Hgb A1c MFr Bld  Date/Time Value Ref Range Status  10/05/2007 06:17 AM   Final   5.4 (NOTE)   The ADA recommends the following therapeutic goals for glycemic   control related to Hgb A1C measurement:   Goal of Therapy:   < 7.0% Hgb A1C   Action Suggested:  > 8.0% Hgb A1C   Ref:  Diabetes Care, 22, Suppl. 1, 1999    CBG: No results for input(s): GLUCAP in the last 168 hours.  Review of Systems:   Bolds are positive  Constitutional: weight loss, gain, night sweats, Fevers, chills, fatigue .  HEENT: headaches, Sore throat, sneezing, nasal congestion, post nasal drip, Difficulty swallowing, Tooth/dental problems, visual complaints visual changes, ear ache CV:  chest pain, radiates:,Orthopnea, PND, swelling in lower extremities, dizziness, palpitations, syncope.  GI  heartburn, indigestion, abdominal pain, nausea, vomiting, diarrhea, change in bowel habits, loss of appetite, bloody stools.  Resp: cough, non-productive:, hemoptysis, dyspnea, chest pain, pleuritic.  Skin: rash or itching or icterus GU: dysuria, change in color of urine, urgency or frequency. flank pain, hematuria  MS: joint pain or swelling. decreased range of motion  Psych: change in mood or affect. depression or anxiety.  Neuro: difficulty with speech, weakness, numbness, ataxia    Past Medical History  He,  has a past medical history of Barrett esophagus, Dysphagia, Gastroparesis, GERD (gastroesophageal reflux disease), Hypercholesteremia, Hypertension, Iron deficiency anemia, Mental  retardation, Schizophrenia (HCC), and Seizure (HCC).   Surgical History    Past Surgical History:  Procedure Laterality Date  . CRANIOTOMY FOR CYST FENESTRATION    . ESOPHAGOGASTRODUODENOSCOPY N/A 01/09/2015   Procedure: ESOPHAGOGASTRODUODENOSCOPY (EGD);  Surgeon: Louis Meckel, MD;  Location: [facility] ENDOSCOPY;  Service: Endoscopy;  Laterality: N/A;     Social History   reports that he has never smoked. He has never used smokeless tobacco. He reports that he does not drink alcohol and does not use drugs.   Family History   His family history is not on file.   Allergies Allergies  Allergen Reactions  . Vancomycin Other (See Comments)    Unknown-possible hives      Home Medications  Prior to Admission medications   Medication Sig Start Date End Date Taking? Authorizing Provider  benazepril (LOTENSIN) 20 MG tablet Take 20 mg by mouth daily.   Yes [provider]  Cholecalciferol (VITAMIN D) 2000 units tablet Take 2,000 Units by mouth daily.   Yes [provider]  divalproex (DEPAKOTE ER) 500 MG 24 hr tablet Take 500 mg by mouth daily.   Yes [provider]  esomeprazole (NEXIUM) 40 MG capsule Take 40 mg by mouth in the morning and at bedtime.   Yes [provider]  lamoTRIgine (LAMICTAL) 100 MG tablet Take 100 mg by mouth daily.   Yes [provider]  levothyroxine (SYNTHROID, LEVOTHROID) 75 MCG tablet Take 75 mcg by mouth daily.   Yes [provider]  metoCLOPramide (REGLAN) 5 MG tablet Take 1 tablet (5 mg total) by mouth 3 (three) times daily before meals. Patient taking differently: Take 5 mg by mouth in the morning and at bedtime.  07/27/15  Yes Regalado, Belkys A, MD  propranolol (INDERAL) 10 MG tablet Take 10 mg by mouth daily. 03/31/18  Yes [provider]  QUEtiapine (SEROQUEL) 400 MG tablet Take 400 mg by mouth 2 (two) times daily.    Yes [provider]  rosuvastatin (CRESTOR) 20 MG tablet Take 20 mg by  mouth daily.   Yes [provider]      Joneen Roach, AGACNP-BC [facility]  See Amion for personal pager PCCM on call pager [phone]  10/21/2020 3:59 AM

## 2020-10-21 NOTE — Progress Notes (Signed)
PT Cancellation Note  Patient Details Name: [name] MRN: [id number] DOB: 14-Aug-1956   Cancelled Treatment:    Reason Eval/Treat Not Completed: Medical issues which prohibited therapy Pt currently out of room, however, when discussing with RN, she requested to hold for the day as pt not medically appropriate. Will follow up as pt appropriate and as schedule allows.   Farley Ly, PT, DPT  Acute Rehabilitation Services  Pager: [phone] Office: [phone]    Lehman Prom 10/21/2020, 10:50 AM

## 2020-10-21 NOTE — Consult Note (Addendum)
[facility] Consult: 12:49 PM 10/21/2020  LOS: 0 days    Referring Provider: Dr Cyndia Skeeters  Primary Care Physician:  [place] Primary Gastroenterologist:  [place]  Sister is [place], [place].   Phone [phone]   Reason for Consultation:  Dilated esophagus.  HH.    HPI: [name] is a 64 y.o. male.  PMH schizophrenia, mild developmental impairment., seizures.  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  Iron deficiency anemia.  S/p craniotomy for cyst fenestration.  Patient uses oxygen as outpatient. Patient lives at a group home.  History of gastroparesis, GERD, HH, GIB.  Has not seen [facility] services since 12/2014. Changed to [facility] last seen there 2019 last egd 2017 Gr B esophagitis History of coffee-ground emesis, melena.  Transfusion requiring anemia as recently as 12/2014. 07/2004 colonoscopy for evaluation of anemia.  Normal study other than very mild sigmoid diverticulosis 2009 EGD showed moderate, acute esophagitis. 2009 gastric emptying study confirmed delayed gastric emptying with 55% retention at 2 hours. 2010 EGD confirmed Barrett's esophagus. 2009 small bowel series was normal. 12/2014 EGD for melena.  This confirmed moderate erosive esophagitis and irregular mucosa at GE junction consistent with Barrett's esophagus.  7 mm hiatal hernia.  Bleeding presumed secondary to esophagitis though no stigmata of bleeding.  Elevated BUN suggests proximal GI source of bleeding.  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    Patient had an unwitnessed fall at his group home yesterday evening, staff reported to have subsequently good practice witnessed syncope.  Patient was diaphoretic, pale, hypoxic, tachycardic per  EMS.  10/21/2020 CTAP and chest with contrast: Marked esophageal dilatation extending inferiorly into large gastric hernia.  And mass-effect on the trachea.  Sigmoid diverticulosis.  Aortic atherosclerosis.  Mild RUL atelectasis. 10/20/2020 portable chest film: Showed marked, severe gastric and esophageal distention.  Hgb 8.2, it was 10-11 in September.  MCV 87. BUN/creatinine 27/1.2. T bili 0.4.  Alk phos 57.  AST/ALT 58/44. Troponin Is 23 >> 27.  LDH 259. Venous lactic acid normal.  Pt provides hx of intermittent regurgitation but not of food getting stuck w swallowing.  Has had regurge now for ~ 2 days.  No change in BM's, occasional constipation.  No bloody or black stool.  No abdominal pain.  No nausea.  No fever or chills.  Overall feels quite well.  Past Medical History:  Diagnosis Date  . Barrett esophagus   . Dysphagia   . Gastroparesis   . GERD (gastroesophageal reflux disease)   . Hypercholesteremia   . Hypertension   . Iron deficiency anemia   . Mental retardation   . Schizophrenia ([place])   . Seizure [facility])     Past Surgical History:  Procedure Laterality Date  . CRANIOTOMY FOR CYST FENESTRATION    . ESOPHAGOGASTRODUODENOSCOPY N/A 01/09/2015   Procedure: ESOPHAGOGASTRODUODENOSCOPY (EGD);  Surgeon: Inda Castle, MD;  Location: [place] [facility];  Service: Endoscopy;  Laterality: N/A;    Prior to Admission medications   Medication Sig  Start Date End Date Taking? Authorizing Provider  benazepril (LOTENSIN) 20 MG tablet Take 20 mg by mouth daily.   Yes [provider]  Cholecalciferol (VITAMIN D) 2000 units tablet Take 2,000 Units by mouth daily.   Yes [provider]  divalproex (DEPAKOTE ER) 500 MG 24 hr tablet Take 500 mg by mouth daily.   Yes [provider]  esomeprazole (NEXIUM) 40 MG capsule Take 40 mg by mouth in the morning and at bedtime.   Yes [provider]  lamoTRIgine (LAMICTAL) 100 MG tablet Take 100 mg by mouth daily.    Yes [provider]  levothyroxine (SYNTHROID, LEVOTHROID) 75 MCG tablet Take 75 mcg by mouth daily.   Yes [provider]  metoCLOPramide (REGLAN) 5 MG tablet Take 1 tablet (5 mg total) by mouth 3 (three) times daily before meals. Patient taking differently: Take 5 mg by mouth in the morning and at bedtime.  07/27/15  Yes Regalado, Belkys A, MD  propranolol (INDERAL) 10 MG tablet Take 10 mg by mouth daily. 03/31/18  Yes [provider]  QUEtiapine (SEROQUEL) 400 MG tablet Take 400 mg by mouth 2 (two) times daily.    Yes [provider]  rosuvastatin (CRESTOR) 20 MG tablet Take 20 mg by mouth daily.   Yes [provider]    Scheduled Meds: . pantoprazole (PROTONIX) IV  40 mg Intravenous Q24H   Infusions: . sodium chloride 125 mL/hr at 10/21/20 0307  . ampicillin-sulbactam (UNASYN) IV Stopped (10/21/20 0550)  . valproate sodium     PRN Meds: albuterol, ondansetron (ZOFRAN) IV   Allergies as of 10/20/2020 - Review Complete 10/20/2020  Allergen Reaction Noted  . Vancomycin Other (See Comments)     History reviewed. No pertinent family history.  Social History   Socioeconomic History  . Marital status: Single    Spouse name: Not on file  . Number of children: Not on file  . Years of education: Not on file  . Highest education level: Not on file  Occupational History  . Not on file  Tobacco Use  . Smoking status: Never Smoker  . Smokeless tobacco: Never Used  Substance and Sexual Activity  . Alcohol use: No  . Drug use: No  . Sexual activity: Never  Other Topics Concern  . Not on file  Social History Narrative  . Not on file   Social Determinants of Health   Financial Resource Strain:   . Difficulty of Paying Living Expenses: Not on file  Food Insecurity:   . Worried About Charity fundraiser in the Last Year: Not on file  . Ran Out of Food in the Last Year: Not on file  Transportation Needs:   . Lack of Transportation  (Medical): Not on file  . Lack of Transportation (Non-Medical): Not on file  Physical Activity:   . Days of Exercise per Week: Not on file  . Minutes of Exercise per Session: Not on file  Stress:   . Feeling of Stress : Not on file  Social Connections:   . Frequency of Communication with Friends and Family: Not on file  . Frequency of Social Gatherings with Friends and Family: Not on file  . Attends Religious Services: Not on file  . Active Member of Clubs or Organizations: Not on file  . Attends Archivist Meetings: Not on file  . Marital Status: Not on file  Intimate Partner Violence:   . Fear of Current or Ex-Partner: Not on  file  . Emotionally Abused: Not on file  . Physically Abused: Not on file  . Sexually Abused: Not on file    REVIEW OF SYSTEMS: Constitutional:  No weakness or fatigue ENT:  No nose bleeds Pulm:  + cough, pt says it is chronic.   CV:  No palpitations, no LE edema. No angina GU:  No hematuria, no frequency GI:  Per HPI Heme:  No unusual bleeding or bruising   Transfusions:  In past, none since 06/2015 Neuro:  No headaches, no peripheral tingling or numbness Derm:  No itching, no rash or sores.  Endocrine:  No sweats or chills.  No polyuria or dysuria Immunization:  Vaccinated for Covid 19 Travel:  None beyond local counties in last few months.    PHYSICAL EXAM: Vital signs in last 24 hours: Vitals:   10/21/20 1206 10/21/20 1217  BP: (!) 148/68 138/74  Pulse: (!) 104   Resp: (!) 29 (!) 27  Temp:  98 F (36.7 C)  SpO2: 97%    Wt Readings from Last 3 Encounters:  10/20/20 77 kg  09/07/20 77.1 kg  02/09/19 70.2 kg    General: very pleasant.  Looks well.  Comfortable.  Not at all ill looking A bedside basin contains scant amount of clear, yellow, bilious, non-bloody liquid. Head:  No asymmetry or swelling.  No signs of trauma  Eyes:  No conj pallor or icterus Ears:  Not HOH  Nose:  No discharge or sniffling Mouth:  Moist, pink,  clear oral MM.   Neck:  No mass, no TMG Lungs:  Diminished bil at least 1/2 way up.  + mucoid cough. Heart: Regular rhythm but mildly tachy.   Abdomen:  Soft, NT.  Slightly protuberant.  No HSM, masses, hernias.  Active BS, no bruits Rectal: deferred   Musc/Skeltl: no joint redness, swelling.  Contracture at left wrist.   Extremities:  No CCE  Neurologic:  Oriented x 3.  Appropriate.  Speech is altered but understandable.   Skin:  Pale, no sores or rashes. No telangectasia   Psych:  Extremely pleasant and cooperative.  Calm.    Intake/Output from previous day: 10/28 0701 - 10/29 0700 In: 1100 [IV Piggyback:1100] Out: -  Intake/Output this shift: No intake/output data recorded.  LAB RESULTS: Recent Labs    10/21/20 0516  WBC 6.5  HGB 8.2*  HCT 28.1*  PLT 231   BMET Lab Results  Component Value Date   NA 141 10/21/2020   NA 136 10/20/2020   NA 140 09/07/2020   K 3.6 10/21/2020   K 4.5 10/20/2020   K 4.4 09/07/2020   CL 111 10/21/2020   CL 104 10/20/2020   CL 104 09/07/2020   CO2 23 10/21/2020   CO2 19 (L) 10/20/2020   CO2 23 09/07/2020   GLUCOSE 112 (H) 10/21/2020   GLUCOSE 163 (H) 10/20/2020   GLUCOSE 98 09/07/2020   BUN 27 (H) 10/21/2020   BUN 37 (H) 10/20/2020   BUN 16 09/07/2020   CREATININE 1.20 10/21/2020   CREATININE 1.70 (H) 10/20/2020   CREATININE 1.26 (H) 09/07/2020   CALCIUM 7.1 (L) 10/21/2020   CALCIUM 8.3 (L) 10/20/2020   CALCIUM 9.1 09/07/2020   LFT Recent Labs    10/20/20 2200 10/21/20 0914  PROT 6.2*  --   ALBUMIN 3.2* 2.6*  AST 58*  --   ALT 44  --   ALKPHOS 57  --   BILITOT 0.4  --    PT/INR Lab Results  Component Value Date   INR 1.2 09/05/2020   INR 1.12 01/15/2016   INR 1.07 01/08/2015   Lipase     Component Value Date/Time   LIPASE 26 03/13/2016 1706    Drugs of Abuse     Component Value Date/Time   LABOPIA NONE DETECTED 01/20/2011 2145   COCAINSCRNUR NONE DETECTED 01/20/2011 2145   LABBENZ NONE DETECTED  01/20/2011 2145   AMPHETMU NONE DETECTED 01/20/2011 2145   THCU NONE DETECTED 01/20/2011 2145   LABBARB  01/20/2011 2145    NONE DETECTED        DRUG SCREEN FOR MEDICAL PURPOSES ONLY.  IF CONFIRMATION IS NEEDED FOR ANY PURPOSE, NOTIFY LAB WITHIN 5 DAYS.        LOWEST DETECTABLE LIMITS FOR URINE DRUG SCREEN Drug Class       Cutoff (ng/mL) Amphetamine      1000 Barbiturate      200 Benzodiazepine   765 Tricyclics       465 Opiates          300 Cocaine          300 THC              50     RADIOLOGY STUDIES: CT HEAD WO CONTRAST  Result Date: 10/21/2020 CLINICAL DATA:  Syncope EXAM: CT HEAD WITHOUT CONTRAST TECHNIQUE: Contiguous axial images were obtained from the base of the skull through the vertex without intravenous contrast. COMPARISON:  March 25, 2018 FINDINGS: Brain: No evidence of acute territorial infarction, hemorrhage, hydrocephalus,extra-axial collection or mass lesion/mass effect. There is ex vacuo dilatation of the right lateral ventricle. Low-attenuation changes in the deep white matter consistent with small vessel ischemia. Large area of encephalomalacia involving the right frontal lobe. Vascular: No hyperdense vessel or unexpected calcification. Skull: Prior frontal craniotomy defect is noted. No fracture or focal lesion identified. Sinuses/Orbits: The visualized paranasal sinuses and mastoid air cells are clear. The orbits and globes intact. Other: None IMPRESSION: No acute intracranial abnormality. Large area encephalomalacia involving the right frontal lobe Electronically Signed   By: Prudencio Pair M.D.   On: 10/21/2020 03:49   CT Angio Chest PE W/Cm &/Or Wo Cm  Result Date: 10/21/2020 CLINICAL DATA:  Status post fall with hypoxia and hiccups. EXAM: CT ANGIOGRAPHY CHEST CT ABDOMEN AND PELVIS WITH CONTRAST TECHNIQUE: Multidetector CT imaging of the chest was performed using the standard protocol during bolus administration of intravenous contrast. Multiplanar CT image  reconstructions and MIPs were obtained to evaluate the vascular anatomy. Multidetector CT imaging of the abdomen and pelvis was performed using the standard protocol during bolus administration of intravenous contrast. CONTRAST:  58m OMNIPAQUE IOHEXOL 350 MG/ML SOLN COMPARISON:  None. FINDINGS: CT CHEST FINDINGS Cardiovascular: No significant vascular findings. Normal heart size. No pericardial effusion. Mediastinum/Nodes: No enlarged mediastinal, hilar, or axillary lymph nodes. The esophagus is markedly dilated (approximately 5.9 cm x 3.9 cm) and extends inferiorly into a large gastric hernia. Subsequent mass effect is seen on the trachea with anterior tracheal displacement noted. Lungs/Pleura: Very mild atelectasis is seen within the posteromedial aspect of the right upper lobe. There is no evidence of acute infiltrate, pleural effusion or pneumothorax. Musculoskeletal: No chest wall mass or suspicious bone lesions identified. CT ABDOMEN PELVIS FINDINGS Hepatobiliary: No focal liver abnormality is seen. No gallstones, gallbladder wall thickening, or biliary dilatation. Pancreas: Unremarkable. No pancreatic ductal dilatation or surrounding inflammatory changes. Spleen: Normal in size without focal abnormality. Adrenals/Urinary Tract: Adrenal glands are unremarkable. Kidneys are normal, without  renal calculi, focal lesion, or hydronephrosis. Bladder is unremarkable. Stomach/Bowel: A large, stable gastric hernia is seen. Appendix appears normal. No evidence of bowel wall thickening, distention, or inflammatory changes. Small, noninflamed diverticula are seen within the sigmoid colon. Vascular/Lymphatic: Aortic atherosclerosis. No enlarged abdominal or pelvic lymph nodes. Reproductive: Prostate is unremarkable. Other: No abdominal wall hernia or abnormality. No abdominopelvic ascites. Musculoskeletal: No acute or significant osseous findings. IMPRESSION: 1. Large, stable gastric hernia with associated esophageal  dilatation and subsequent mass effect on the trachea. 2. Very mild right upper lobe atelectasis. 3. Sigmoid diverticulosis. 4. Aortic atherosclerosis. Aortic Atherosclerosis (ICD10-I70.0). Electronically Signed   By: Virgina Norfolk M.D.   On: 10/21/2020 00:20   CT ABDOMEN PELVIS W CONTRAST  Result Date: 10/21/2020 CLINICAL DATA:  Unwitnessed fall with hypoxia, hiccups and coughing. EXAM: CT CHEST, ABDOMEN, AND PELVIS WITH CONTRAST TECHNIQUE: Multidetector CT imaging of the chest, abdomen and pelvis was performed following the standard protocol during bolus administration of intravenous contrast. CONTRAST:  37m OMNIPAQUE IOHEXOL 350 MG/ML SOLN COMPARISON:  Abdomen pelvis CT, dated September 05, 2020 FINDINGS: CT CHEST FINDINGS Cardiovascular: No significant vascular findings. Normal heart size. No pericardial effusion. Mediastinum/Nodes: No enlarged mediastinal, hilar, or axillary lymph nodes. The esophagus is markedly dilated (approximately 5.9 cm x 3.9 cm) and extends inferiorly into a large gastric hernia. Subsequent mass effect is seen on the trachea with anterior tracheal displacement noted. Lungs/Pleura: Very mild atelectasis is seen within the posteromedial aspect of the right upper lobe. There is no evidence of acute infiltrate, pleural effusion or pneumothorax. Musculoskeletal: No chest wall mass or suspicious bone lesions identified. CT ABDOMEN PELVIS FINDINGS Hepatobiliary: No focal liver abnormality is seen. No gallstones, gallbladder wall thickening, or biliary dilatation. Pancreas: Unremarkable. No pancreatic ductal dilatation or surrounding inflammatory changes. Spleen: Normal in size without focal abnormality. Adrenals/Urinary Tract: Adrenal glands are unremarkable. Kidneys are normal, without renal calculi, focal lesion, or hydronephrosis. Bladder is unremarkable. Stomach/Bowel: A large, stable gastric hernia is seen. Appendix appears normal. No evidence of bowel wall thickening, distention,  or inflammatory changes. Small, noninflamed diverticula are seen within the sigmoid colon. Vascular/Lymphatic: Aortic atherosclerosis. No enlarged abdominal or pelvic lymph nodes. Reproductive: Prostate is unremarkable. Other: No abdominal wall hernia or abnormality. No abdominopelvic ascites. Musculoskeletal: No acute or significant osseous findings. IMPRESSION: 1. Large, stable gastric hernia with associated esophageal dilatation and subsequent mass effect on the trachea. 2. Very mild right upper lobe atelectasis. 3. Sigmoid diverticulosis. 4. Aortic atherosclerosis. Aortic Atherosclerosis (ICD10-I70.0). Electronically Signed   By: TVirgina NorfolkM.D.   On: 10/21/2020 00:13   DG Chest Port 1 View  Result Date: 10/20/2020 CLINICAL DATA:  Hypoxia and cough. EXAM: PORTABLE CHEST 1 VIEW COMPARISON:  September 05, 2020 FINDINGS: Low lung volumes are seen which is likely, in part, secondary to the degree of patient inspiration. There is no evidence of acute infiltrate, pleural effusion or pneumothorax. The heart size and mediastinal contours are within normal limits. A markedly distended and tortuous air-filled esophagus is seen. Marked severity gastric distension is also present. The visualized skeletal structures are unremarkable. IMPRESSION: 1. Low lung volumes without evidence of acute infiltrate. 2. Marked severity gastric and esophageal distention. Sequelae associated with gastric outlet or small bowel obstruction cannot be excluded. Correlation with abdomen pelvis CT is recommended. Electronically Signed   By: TVirgina NorfolkM.D.   On: 10/20/2020 21:44   EEG adult  Result Date: 10/21/2020 YLora Havens MD     10/21/2020 12:29 PM Patient  Name: [name] MRN: [id number] Epilepsy Attending: Lora Havens Referring Physician/Provider: Dr Derrick Ravel Date: 10/21/2020 Duration: 25.58 mins Patient history: 64 year old male with syncope.  EEG to evaluate for seizures. Level of  alertness: Awake, drowsy AEDs during EEG study: VPA Technical aspects: This EEG study was done with scalp electrodes positioned according to the 10-20 International system of electrode placement. Electrical activity was acquired at a sampling rate of 500Hz  and reviewed with a high frequency filter of 70Hz  and a low frequency filter of 1Hz . EEG data were recorded continuously and digitally stored. Description: The posterior dominant rhythm consists of 6.5 Hz activity of moderate voltage (25-35 uV) seen predominantly in posterior head regions, symmetric and reactive to eye opening and eye closing.  Drowsiness is characterized by attenuation of the posterior dominant rhythm.  EEG showed continuous generalized 3 to 6 Hz theta-delta slowing. Physiologic photic driving was not seen during photic stimulation.  Hyperventilation was not performed.   ABNORMALITY -Continuous slow, generalized - Background slow IMPRESSION: This study is suggestive of mild to moderate diffuse encephalopathy, nonspecific etiology. No seizures or epileptiform discharges were seen throughout the recording. Priyanka Barbra Sarks    IMPRESSION:   *   Large hiatal hernia associated with gastric and esophageal distention as well as tracheal mass-effect. Documented history of Barrett's esophagus and gastroparesis.  No upper endoscopy since 20117 Outpatient meds include Nexium bid and low-dose Reglan tid AC.   *   Syncope.  Head CT non-worrisome.  Moderate encephalopathy per EEG.    *   Aspiration PNA, empiric Unasyn in place. No infiltrate on initial CXR.     *   AKI, improved.    *    Normocytic anemia.  Hgb down ~ 2 grams c/w 6 weeks ago.    *   Mild AST elevation, pt does not drink ETOH.  Unremarkable liver, GB and biliary tree per CT    PLAN:     *  EGD, ? Timing?   Pt NPO now, wonder if he could have clears? Spoke w pt's sister about EGD and she is happy to give consent.  Aiming to get this done today.     Azucena Freed   10/21/2020, 12:49 PM Phone [phone]    [place] Attending   I have taken an interval history, reviewed the chart and examined the patient. I agree with the Advanced Practitioner's note, impression and recommendations.    Patient seen earlier - prior to EGD  EGD done  10 cm hiatal hernia 2 small gastric polyps - bxed Irregular Z-line with hiatal hernia deformity vs short segment Barrett's - bxed  Will f/u path and be available if needed.  Gatha Mayer, MD, [facility] 10/21/2020 4:41 PM

## 2020-10-21 NOTE — Progress Notes (Addendum)
RN Sim Boast M. and Marzella Schlein.) spoke with patient's sister (POA) to consent for EDG  at this time. Consent in chart.  Sim Boast, RN

## 2020-10-21 NOTE — Op Note (Signed)
[facility] Patient Name: [name] Procedure Date : 10/21/2020 MRN: [id number] Attending MD: Iva Boop , MD Date of Birth: 1956/09/20 CSN: [id number] Age: 64 Admit Type: Inpatient Procedure:                Upper GI endoscopy Indications:              Barrett's esophagus, Follow-up of Barrett's                            esophagus, Hiatal hernia Providers:                Iva Boop, MD, Vicki Mallet, RN, Leanne Lovely, Technician, Jefm Miles, CRNA Referring MD:              Medicines:                Monitored Anesthesia Care Complications:            No immediate complications. Estimated Blood Loss:     Estimated blood loss was minimal. Procedure:                Pre-Anesthesia Assessment:                           - Prior to the procedure, a History and Physical                            was performed, and patient medications and                            allergies were reviewed. The patient's tolerance of                            previous anesthesia was also reviewed. The risks                            and benefits of the procedure and the sedation                            options and risks were discussed with the patient.                            All questions were answered, and informed consent                            was obtained. Prior Anticoagulants: The patient has                            taken no previous anticoagulant or antiplatelet                            agents. ASA Grade Assessment: III - A patient with  severe systemic disease. After reviewing the risks                            and benefits, the patient was deemed in                            satisfactory condition to undergo the procedure.                           After obtaining informed consent, the endoscope was                            passed under direct vision. Throughout the                             procedure, the patient's blood pressure, pulse, and                            oxygen saturations were monitored continuously. The                            GIF-H190 ([id number] Olympus gastroscope was                            introduced through the mouth, and advanced to the                            second part of duodenum. The upper GI endoscopy was                            accomplished without difficulty. The patient                            tolerated the procedure well. Scope In: Scope Out: Findings:      The lumen of the esophagus was moderately dilated.      The examined esophagus was mildly tortuous.      There were esophageal mucosal changes secondary to established       short-segment Barrett's disease present in the distal esophagus. The       maximum longitudinal extent of these mucosal changes was 1 cm in length.       Mucosa was biopsied with a cold forceps for histology at 29 cm from the       incisors. One specimen bottle was sent to pathology. Verification of       patient identification for the specimen was done. Estimated blood loss       was minimal.      A 10 cm hiatal hernia was present.      The cardia and gastric fundus were normal on retroflexion.      Two 4 to 8 mm sessile polyps were found in the gastric body. Biopsies       were taken with a cold forceps for histology. Verification of patient       identification for the specimen was done. Estimated blood loss was       minimal.      The  exam was otherwise without abnormality. Impression:               - Dilation in the entire esophagus.                           - Tortuous esophagus.                           - Esophageal mucosal changes secondary to                            established short-segment Barrett's disease.                            Biopsied. I do have some ? if this is really a                            distorted Z line due to hernioa effect and not true                             esophageal mucosa that is with intestinal metaplasia                           - 10 cm hiatal hernia.                           - Two gastric polyps. Biopsied.                           - The examination was otherwise normal. Recommendation:           - Return patient to hospital ward for ongoing care.                           - Resume regular diet.                           - Continue present medications.                           - Await pathology results.                           - findings reviewed with sister by phone                           I WOULD BE EXTREMELY CAUTIOUS ABOUT HIATAL HERNIA                            REPAIR IN THIS MAN - NOT NEVER BUT I DO NOT THINK                            WE AR SURE HIS EVENT WAS AN ASPIRATION FROM THE  HIATAL HERNIA AND HIS ESOPHAGUS SEEMS TO HAVE                            INEFFECTIVE MOTILITY - WOULD CERTAINLY TRY TO WORK                            THAT UP BETTER BEFORE ANY SURGERY, IF CONSIDERED Procedure Code(s):        --- Professional ---                           [phone], Esophagogastroduodenoscopy, flexible,                            transoral; with biopsy, single or multiple Diagnosis Code(s):        --- Professional ---                           K22.8, Other specified diseases of esophagus                           Q39.9, Congenital malformation of esophagus,                            unspecified                           K22.70, Barrett's esophagus without dysplasia                           K44.9, Diaphragmatic hernia without obstruction or                            gangrene                           K31.7, Polyp of stomach and duodenum CPT copyright 2019 [employer]. All rights reserved. The codes documented in this report are preliminary and upon coder review may  be revised to meet current compliance requirements. Iva Boop, MD 10/21/2020 5:12:49 PM This report has been  signed electronically. Number of Addenda: 0

## 2020-10-21 NOTE — Progress Notes (Signed)
  Echocardiogram 2D Echocardiogram has been performed.  Burnard Hawthorne 10/21/2020, 10:37 AM

## 2020-10-21 NOTE — ED Provider Notes (Signed)
3:29 AM Assumed care from Dr. Bernette Mayers, please see their note for full history, physical and decision making until this point. In brief this is a 64 y.o. year old male who presented to the ED tonight with Loss of Consciousness     Patient apparently had 3 episodes of syncope and thus was brought here for further evaluation.  On exam here he was tachypneic, tachycardic and hypoxic but with his home oxygen was normoxic.  Patient work-up thus far for syncope has been okay pending CT scan.  Is CT shows a large hiatal hernia.  I am wondering if maybe he has some right ventricular collapse secondary to the hernia that could be causing some outflow obstruction type picture and syncope.  He is also very tachypneic which once again could be related to this large hiatal hernia compressing his trachea.  I will discuss with cardiothoracic surgery.  Dr. Cliffton Asters called back and agreed that this would be a possibility.  However there is obviously still other causes for the symptoms and has no problem evaluate the patient in the morning.  He agrees that medicine would likely be the best place for admission at this time.  I discussed with hospitalist who will admit.  CRITICAL CARE Performed by: Marily Memos Total critical care time: 35 minutes Critical care time was exclusive of separately billable procedures and treating other patients. Critical care was necessary to treat or prevent imminent or life-threatening deterioration. Critical care was time spent personally by me on the following activities: development of treatment plan with patient and/or surrogate as well as nursing, discussions with consultants, evaluation of patient's response to treatment, examination of patient, obtaining history from patient or surrogate, ordering and performing treatments and interventions, ordering and review of laboratory studies, ordering and review of radiographic studies, pulse oximetry and re-evaluation of patient's  condition.   Labs, studies and imaging reviewed by myself and considered in medical decision making if ordered. Imaging interpreted by radiology.  Labs Reviewed  COMPREHENSIVE METABOLIC PANEL - Abnormal; Notable for the following components:      Result Value   CO2 19 (*)    Glucose, Bld 163 (*)    BUN 37 (*)    Creatinine, Ser 1.70 (*)    Calcium 8.3 (*)    Total Protein 6.2 (*)    Albumin 3.2 (*)    AST 58 (*)    GFR, Estimated 45 (*)    All other components within normal limits  D-DIMER, QUANTITATIVE (NOT AT [facility]) - Abnormal; Notable for the following components:   D-Dimer, Quant 0.62 (*)    All other components within normal limits  LACTATE DEHYDROGENASE - Abnormal; Notable for the following components:   LDH 259 (*)    All other components within normal limits  FERRITIN - Abnormal; Notable for the following components:   Ferritin 15 (*)    All other components within normal limits  FIBRINOGEN - Abnormal; Notable for the following components:   Fibrinogen 203 (*)    All other components within normal limits  C-REACTIVE PROTEIN - Abnormal; Notable for the following components:   CRP 1.2 (*)    All other components within normal limits  RESPIRATORY PANEL BY RT PCR (FLU A&B, COVID)  CULTURE, BLOOD (ROUTINE X 2)  CULTURE, BLOOD (ROUTINE X 2)  LACTIC ACID, PLASMA  LACTIC ACID, PLASMA  PROCALCITONIN  TRIGLYCERIDES  CBC WITH DIFFERENTIAL/PLATELET  CBC WITH DIFFERENTIAL/PLATELET  BASIC METABOLIC PANEL  TROPONIN I (HIGH SENSITIVITY)    CT  Angio Chest PE W/Cm &/Or Wo Cm  Final Result    CT ABDOMEN PELVIS W CONTRAST  Final Result    DG Chest Port 1 View  Final Result    CT HEAD WO CONTRAST    (Results Pending)    No follow-ups on file.    Illyana Schorsch, Barbara Cower, MD 10/21/20 [phone]

## 2020-10-21 NOTE — H&P (Signed)
History and Physical    [name] ZWC:[id number] DOB: 07-22-1956 DOA: 10/20/2020  PCP: [facility] Patient coming from: Group home  Chief Complaint: Syncope  HPI: [name] is a 64 y.o. male with medical history significant of stroke with left-sided deficits and dysphagia, Barrett's esophagus, GERD, gastroparesis, hypertension, hyperlipidemia, iron deficiency anemia, mental retardation, schizophrenia, seizure disorder, hypothyroidism presenting to the ED via EMS from his group home for evaluation of fall and syncope.  It is reported that patient had an unwitnessed fall at the facility and once staff assisted him to his feet he had a syncopal episode.  Patient subsequently had another syncopal event with EMS and reported to be pale and diaphoretic.  He was tachycardic with heart rate 120-130.  SPO2 88% on room air.  CBG 255.  Patient is not sure why he is here.  He does not recall falling.  He has no complaints.  Denies dizziness, chest pain, or shortness of breath.  No additional history could be obtained from him.  ED Course: Afebrile.  Persistently tachycardic and tachypneic.  Requiring 3 L supplemental oxygen.  CBC pending.  Sodium 136, potassium 4.5, chloride 104, bicarb 19, anion gap 13, BUN 37, creatinine 1.7 (baseline 1.2), glucose 163.  SARS-CoV-2 PCR test negative.  Influenza panel negative.  D-dimer 0.62.  Procalcitonin 0.11.  Blood culture x2 pending.  Initial lactic acid 1.5, repeat pending.  Chest x-ray without evidence of acute infiltrate.  Showing marked severely gastric and esophageal distention.  CT angiogram chest negative for PE.  CT abdomen pelvis showing a large, stable gastric hernia with associated esophageal dilatation and subsequent mass-effect on the trachea.  ED provider discussed the case with Dr. Cliffton Asters, cardiothoracic surgery will consult in a.m.  Review of Systems:  All systems reviewed and apart from history of  presenting illness, are negative.  Past Medical History:  Diagnosis Date  . Barrett esophagus   . Dysphagia   . Gastroparesis   . GERD (gastroesophageal reflux disease)   . Hypercholesteremia   . Hypertension   . Iron deficiency anemia   . Mental retardation   . Schizophrenia (HCC)   . Seizure [facility])     Past Surgical History:  Procedure Laterality Date  . CRANIOTOMY FOR CYST FENESTRATION    . ESOPHAGOGASTRODUODENOSCOPY N/A 01/09/2015   Procedure: ESOPHAGOGASTRODUODENOSCOPY (EGD);  Surgeon: Louis Meckel, MD;  Location: [facility] ENDOSCOPY;  Service: Endoscopy;  Laterality: N/A;     reports that he has never smoked. He has never used smokeless tobacco. He reports that he does not drink alcohol and does not use drugs.  Allergies  Allergen Reactions  . Vancomycin Other (See Comments)    Unknown-possible hives     History reviewed. No pertinent family history.  Prior to Admission medications   Medication Sig Start Date End Date Taking? Authorizing Provider  benazepril (LOTENSIN) 20 MG tablet Take 20 mg by mouth daily.    [provider]  Cholecalciferol (VITAMIN D) 2000 units tablet Take 2,000 Units by mouth daily.    [provider]  divalproex (DEPAKOTE ER) 500 MG 24 hr tablet Take 500 mg by mouth daily.    [provider]  esomeprazole (NEXIUM) 40 MG capsule Take 40 mg by mouth in the morning and at bedtime.    [provider]  lamoTRIgine (LAMICTAL) 100 MG tablet Take 100 mg by mouth daily.    [provider]  levothyroxine (SYNTHROID, LEVOTHROID) 75 MCG tablet Take 75 mcg by  mouth daily.    [provider]  metoCLOPramide (REGLAN) 5 MG tablet Take 1 tablet (5 mg total) by mouth 3 (three) times daily before meals. Patient taking differently: Take 5 mg by mouth in the morning and at bedtime.  07/27/15   Regalado, Belkys A, MD  propranolol (INDERAL) 10 MG tablet Take 10 mg by mouth daily. 03/31/18   [provider]   QUEtiapine (SEROQUEL) 400 MG tablet Take 400 mg by mouth 2 (two) times daily.     [provider]  rosuvastatin (CRESTOR) 20 MG tablet Take 20 mg by mouth daily.    [provider]    Physical Exam: Vitals:   10/20/20 2200 10/20/20 2300 10/21/20 0100 10/21/20 0200  BP: (!) 136/56 111/68 134/81 106/64  Pulse: (!) 134 (!) 115 (!) 110 (!) 116  Resp: (!) 48 (!) 40 (!) 49 (!) 40  Temp:      TempSrc:      SpO2: 96% 98% 98% 98%  Weight:      Height:        Physical Exam Constitutional:      Appearance: He is not diaphoretic.  HENT:     Head: Normocephalic and atraumatic.     Mouth/Throat:     Mouth: Mucous membranes are dry.     Pharynx: Oropharynx is clear.  Eyes:     Extraocular Movements: Extraocular movements intact.     Pupils: Pupils are equal, round, and reactive to light.  Cardiovascular:     Rate and Rhythm: Normal rate and regular rhythm.     Pulses: Normal pulses.  Pulmonary:     Breath sounds: No rales.     Comments: Extremely tachypneic with respiratory rate in the 50s and accessory muscle use Inspiratory wheezing Abdominal:     General: Bowel sounds are normal. There is distension.     Tenderness: There is no abdominal tenderness. There is no guarding.  Musculoskeletal:        General: No tenderness.     Cervical back: Normal range of motion and neck supple.     Comments: +1 pedal edema bilaterally  Skin:    General: Skin is warm and dry.  Neurological:     Mental Status: He is alert.     Comments: Awake and alert Moving all extremities on command.  Does have residual left-sided weakness from prior stroke.     Labs on Admission: I have personally reviewed following labs and imaging studies  CBC: No results for input(s): WBC, NEUTROABS, HGB, HCT, MCV, PLT in the last 168 hours. Basic Metabolic Panel: Recent Labs  Lab 10/20/20 2200  NA 136  K 4.5  CL 104  CO2 19*  GLUCOSE 163*  BUN 37*  CREATININE 1.70*  CALCIUM 8.3*    GFR: Estimated Creatinine Clearance: 41.4 mL/min (A) (by C-G formula based on SCr of 1.7 mg/dL (H)). Liver Function Tests: Recent Labs  Lab 10/20/20 2200  AST 58*  ALT 44  ALKPHOS 57  BILITOT 0.4  PROT 6.2*  ALBUMIN 3.2*   No results for input(s): LIPASE, AMYLASE in the last 168 hours. No results for input(s): AMMONIA in the last 168 hours. Coagulation Profile: No results for input(s): INR, PROTIME in the last 168 hours. Cardiac Enzymes: No results for input(s): CKTOTAL, CKMB, CKMBINDEX, TROPONINI in the last 168 hours. BNP (last 3 results) No results for input(s): PROBNP in the last 8760 hours. HbA1C: No results for input(s): HGBA1C in the last 72 hours. CBG: No  results for input(s): GLUCAP in the last 168 hours. Lipid Profile: Recent Labs    10/20/20 2120  TRIG 103   Thyroid Function Tests: No results for input(s): TSH, T4TOTAL, FREET4, T3FREE, THYROIDAB in the last 72 hours. Anemia Panel: Recent Labs    10/20/20 2200  FERRITIN 15*   Urine analysis:    Component Value Date/Time   COLORURINE YELLOW 09/07/2020 1902   APPEARANCEUR CLEAR 09/07/2020 1902   LABSPEC 1.012 09/07/2020 1902   PHURINE 7.0 09/07/2020 1902   GLUCOSEU NEGATIVE 09/07/2020 1902   HGBUR NEGATIVE 09/07/2020 1902   BILIRUBINUR NEGATIVE 09/07/2020 1902   KETONESUR 5 (A) 09/07/2020 1902   PROTEINUR NEGATIVE 09/07/2020 1902   UROBILINOGEN 1.0 07/23/2015 2300   NITRITE NEGATIVE 09/07/2020 1902   LEUKOCYTESUR NEGATIVE 09/07/2020 1902    Radiological Exams on Admission: CT Angio Chest PE W/Cm &/Or Wo Cm  Result Date: 10/21/2020 CLINICAL DATA:  Status post fall with hypoxia and hiccups. EXAM: CT ANGIOGRAPHY CHEST CT ABDOMEN AND PELVIS WITH CONTRAST TECHNIQUE: Multidetector CT imaging of the chest was performed using the standard protocol during bolus administration of intravenous contrast. Multiplanar CT image reconstructions and MIPs were obtained to evaluate the vascular anatomy.  Multidetector CT imaging of the abdomen and pelvis was performed using the standard protocol during bolus administration of intravenous contrast. CONTRAST:  50mL OMNIPAQUE IOHEXOL 350 MG/ML SOLN COMPARISON:  None. FINDINGS: CT CHEST FINDINGS Cardiovascular: No significant vascular findings. Normal heart size. No pericardial effusion. Mediastinum/Nodes: No enlarged mediastinal, hilar, or axillary lymph nodes. The esophagus is markedly dilated (approximately 5.9 cm x 3.9 cm) and extends inferiorly into a large gastric hernia. Subsequent mass effect is seen on the trachea with anterior tracheal displacement noted. Lungs/Pleura: Very mild atelectasis is seen within the posteromedial aspect of the right upper lobe. There is no evidence of acute infiltrate, pleural effusion or pneumothorax. Musculoskeletal: No chest wall mass or suspicious bone lesions identified. CT ABDOMEN PELVIS FINDINGS Hepatobiliary: No focal liver abnormality is seen. No gallstones, gallbladder wall thickening, or biliary dilatation. Pancreas: Unremarkable. No pancreatic ductal dilatation or surrounding inflammatory changes. Spleen: Normal in size without focal abnormality. Adrenals/Urinary Tract: Adrenal glands are unremarkable. Kidneys are normal, without renal calculi, focal lesion, or hydronephrosis. Bladder is unremarkable. Stomach/Bowel: A large, stable gastric hernia is seen. Appendix appears normal. No evidence of bowel wall thickening, distention, or inflammatory changes. Small, noninflamed diverticula are seen within the sigmoid colon. Vascular/Lymphatic: Aortic atherosclerosis. No enlarged abdominal or pelvic lymph nodes. Reproductive: Prostate is unremarkable. Other: No abdominal wall hernia or abnormality. No abdominopelvic ascites. Musculoskeletal: No acute or significant osseous findings. IMPRESSION: 1. Large, stable gastric hernia with associated esophageal dilatation and subsequent mass effect on the trachea. 2. Very mild right  upper lobe atelectasis. 3. Sigmoid diverticulosis. 4. Aortic atherosclerosis. Aortic Atherosclerosis (ICD10-I70.0). Electronically Signed   By: Aram Candela M.D.   On: 10/21/2020 00:20   CT ABDOMEN PELVIS W CONTRAST  Result Date: 10/21/2020 CLINICAL DATA:  Unwitnessed fall with hypoxia, hiccups and coughing. EXAM: CT CHEST, ABDOMEN, AND PELVIS WITH CONTRAST TECHNIQUE: Multidetector CT imaging of the chest, abdomen and pelvis was performed following the standard protocol during bolus administration of intravenous contrast. CONTRAST:  16mL OMNIPAQUE IOHEXOL 350 MG/ML SOLN COMPARISON:  Abdomen pelvis CT, dated September 05, 2020 FINDINGS: CT CHEST FINDINGS Cardiovascular: No significant vascular findings. Normal heart size. No pericardial effusion. Mediastinum/Nodes: No enlarged mediastinal, hilar, or axillary lymph nodes. The esophagus is markedly dilated (approximately 5.9 cm x 3.9 cm) and extends inferiorly  into a large gastric hernia. Subsequent mass effect is seen on the trachea with anterior tracheal displacement noted. Lungs/Pleura: Very mild atelectasis is seen within the posteromedial aspect of the right upper lobe. There is no evidence of acute infiltrate, pleural effusion or pneumothorax. Musculoskeletal: No chest wall mass or suspicious bone lesions identified. CT ABDOMEN PELVIS FINDINGS Hepatobiliary: No focal liver abnormality is seen. No gallstones, gallbladder wall thickening, or biliary dilatation. Pancreas: Unremarkable. No pancreatic ductal dilatation or surrounding inflammatory changes. Spleen: Normal in size without focal abnormality. Adrenals/Urinary Tract: Adrenal glands are unremarkable. Kidneys are normal, without renal calculi, focal lesion, or hydronephrosis. Bladder is unremarkable. Stomach/Bowel: A large, stable gastric hernia is seen. Appendix appears normal. No evidence of bowel wall thickening, distention, or inflammatory changes. Small, noninflamed diverticula are seen within  the sigmoid colon. Vascular/Lymphatic: Aortic atherosclerosis. No enlarged abdominal or pelvic lymph nodes. Reproductive: Prostate is unremarkable. Other: No abdominal wall hernia or abnormality. No abdominopelvic ascites. Musculoskeletal: No acute or significant osseous findings. IMPRESSION: 1. Large, stable gastric hernia with associated esophageal dilatation and subsequent mass effect on the trachea. 2. Very mild right upper lobe atelectasis. 3. Sigmoid diverticulosis. 4. Aortic atherosclerosis. Aortic Atherosclerosis (ICD10-I70.0). Electronically Signed   By: Aram Candela M.D.   On: 10/21/2020 00:13   DG Chest Port 1 View  Result Date: 10/20/2020 CLINICAL DATA:  Hypoxia and cough. EXAM: PORTABLE CHEST 1 VIEW COMPARISON:  September 05, 2020 FINDINGS: Low lung volumes are seen which is likely, in part, secondary to the degree of patient inspiration. There is no evidence of acute infiltrate, pleural effusion or pneumothorax. The heart size and mediastinal contours are within normal limits. A markedly distended and tortuous air-filled esophagus is seen. Marked severity gastric distension is also present. The visualized skeletal structures are unremarkable. IMPRESSION: 1. Low lung volumes without evidence of acute infiltrate. 2. Marked severity gastric and esophageal distention. Sequelae associated with gastric outlet or small bowel obstruction cannot be excluded. Correlation with abdomen pelvis CT is recommended. Electronically Signed   By: Aram Candela M.D.   On: 10/20/2020 21:44    EKG: Independently reviewed.  Sinus tachycardia.  Rate increased since prior tracing.  Assessment/Plan Principal Problem:   Syncope Active Problems:   Hypothyroidism   Gastroesophageal hernia   Acute hypoxemic respiratory failure (HCC)   Sinus tachycardia   Syncope: Patient had a syncopal event at his group home and another episode with EMS. EKG showing sinus tachycardia and no acute ischemic changes.   Persistently tachycardic but not hypotensive.  CT angiogram negative for PE. -Cardiac monitoring.  Check high-sensitivity troponin level.  Check orthostatics.  Order echocardiogram, stat head CT, and EEG.  Large gastric hernia with associated esophageal dilation and subsequent mass-effect on the trachea: Seen on CT. -ED provider discussed the case with Dr. Cliffton Asters, cardiothoracic surgery will consult in a.m. Will keep n.p.o.  Acute hypoxemic respiratory failure: Suspect due to mass-effect on the trachea from large gastric hernia with associated esophageal dilation.  Patient is extremely tachypneic with respiratory rate in the 50s and accessory muscle use.  Has inspiratory wheezing.  SPO2 88% on room air.  Currently requiring 3 L supplemental oxygen to maintain sats in the upper 90s.  However, desatted to the mid 80s briefly after coughing aggressively.  Sats improved to upper 90s after coughing subsided. -Continuous pulse ox.  Continue supplemental oxygen, wean as tolerated.  Albuterol nebulizer as needed.  BiPAP may increase risk of aspiration given large gastric hernia with associated esophageal dilation.  PCCM  has been consulted, appreciate recommendations.  Sinus tachycardia: Suspect due to findings mentioned above.  Dehydration could also be contributing. -IV fluid hydration.  Check TSH level.  AKI: Likely prerenal azotemia secondary to dehydration and ACE inhibitor use.  BUN 37, creatinine 1.7 (baseline 1.2). -IV fluid hydration.  Monitor renal function and urine output.  Avoid nephrotoxic agents.  Hold ACE inhibitor.   Normal anion gap metabolic acidosis: Likely secondary to AKI.  Bicarb 19, anion gap 13. -IV fluid hydration and continue to monitor metabolic panel  Fall -PT/OT eval, fall precautions  Hypertension -Hold home antihypertensives at this time, checking orthostatics  Hyperlipidemia -Resume home statin after pharmacy med rec is done and when patient is no longer  n.p.o.  Seizure disorder -Resume home antiepileptic medications after pharmacy med rec is done.  Hypothyroidism -Resume home Synthroid after pharmacy med rec is done  GERD -IV Protonix 40 mg daily  DVT prophylaxis: SCDs at this time Code Status: Full code Family Communication: No family available at this time. Disposition Plan: Status is: Inpatient  Remains inpatient appropriate because:Ongoing diagnostic testing needed not appropriate for outpatient work up, Unsafe d/c plan, IV treatments appropriate due to intensity of illness or inability to take PO and Inpatient level of care appropriate due to severity of illness   Dispo: The patient is from: Group home              Anticipated d/c is to: Group home              Anticipated d/c date is: 3 days              Patient currently is not medically stable to d/c.  The medical decision making on this patient was of high complexity and the patient is at high risk for clinical deterioration, therefore this is a level 3 visit.  John Giovanni MD [facility]  If 7PM-7AM, please contact night-coverage [email or web address]  10/21/2020, 2:35 AM

## 2020-10-21 NOTE — Anesthesia Preprocedure Evaluation (Addendum)
Anesthesia Evaluation  Patient identified by MRN, date of birth, ID bandGeneral Assessment Comment:Patient awake without complaints  Reviewed: Allergy & Precautions, NPO status , Patient's Chart, lab work & pertinent test results  Airway Mallampati: II  TM Distance: >3 FB Neck ROM: Full    Dental  (+) Edentulous Upper, Edentulous Lower   Pulmonary  On oxygen   Pulmonary exam normal breath sounds clear to auscultation       Cardiovascular hypertension, Pt. on medications Normal cardiovascular exam Rhythm:Regular Rate:Normal  ECG: rate 123. Sinus tachycardia Probable left atrial enlargement Right axis deviation  ECHO: 1. There is an intracavitary gradient up to 24 mmHG due to the hyperdynamic LV function. Left ventricular ejection fraction, by estimation, is 70 to 75%. The left ventricle has hyperdynamic function. The left ventricle has no regional wall motion abnormalities. Left ventricular diastolic parameters are consistent with Grade I diastolic dysfunction (impaired relaxation). 2. Right ventricular systolic function is normal. The right ventricular size is normal. Tricuspid regurgitation signal is inadequate for assessing PA pressure. 3. The mitral valve is grossly normal. No evidence of mitral valve regurgitation. No evidence of mitral stenosis. 4. The aortic valve is tricuspid. Aortic valve regurgitation is not visualized. No aortic stenosis is present. 5. The inferior vena cava is normal in size with greater than 50% respiratory variability, suggesting right atrial pressure of 3 mmHg.   Neuro/Psych Seizures -,  PSYCHIATRIC DISORDERS Depression Schizophrenia Mental retardation    GI/Hepatic Neg liver ROS, hiatal hernia, GERD  Medicated,  Endo/Other  Hypothyroidism   Renal/GU negative Renal ROS     Musculoskeletal negative musculoskeletal ROS (+)   Abdominal (+) + obese,   Peds  Hematology  (+) anemia , HLD    Anesthesia Other Findings large HH causeing esophageal dilatation and mass effect on trachea.  hx barretts esophagus, last GD 12/2014  Reproductive/Obstetrics                            Anesthesia Physical Anesthesia Plan  ASA: III  Anesthesia Plan: MAC   Post-op Pain Management:    Induction: Intravenous  PONV Risk Score and Plan: 1 and Propofol infusion and Treatment may vary due to age or medical condition  Airway Management Planned: Nasal Cannula  Additional Equipment:   Intra-op Plan:   Post-operative Plan:   Informed Consent: I have reviewed the patients History and Physical, chart, labs and discussed the procedure including the risks, benefits and alternatives for the proposed anesthesia with the patient or authorized representative who has indicated his/her understanding and acceptance.     Consent reviewed with POA  Plan Discussed with: CRNA and Surgeon  Anesthesia Plan Comments:         Anesthesia Quick Evaluation

## 2020-10-21 NOTE — Progress Notes (Signed)
EEG complete - results pending 

## 2020-10-21 NOTE — Progress Notes (Signed)
Patient's MEWS score is red d/t high respirations and tachycardia. MD is aware. Patient is otherwise stable. Will continue to monitor.

## 2020-10-21 NOTE — ED Notes (Signed)
Please call [name] [facility], pt sister) @ [phone]--for a status update

## 2020-10-21 NOTE — Progress Notes (Signed)
PROGRESS NOTE  [name] STM:[id number] DOB: 1956-08-16   PCP: [facility]  Patient is from: Group home  DOA: 10/20/2020 LOS: 0  Chief complaints: Syncope  Brief Narrative / Interim history: 64 year old male with history of intellectual disability, CVA with residual left hemiparesis, schizophrenia, seizure disorder, hypothyroidism, dysphagia, Barrett's esophagitis and gastroparesis brought to ED by EMS with concern for syncopal episodes, hypoxic respiratory failure and tachycardia.  Reportedly, EMS noted another syncopal episode with diaphoresis.  He was hypoxic to 88% on room air requiring supplemental oxygen.  In ED, afebrile.  Persistently tachycardic and tachypneic.  Requiring 3 L to maintain appropriate saturation.  Significant labs include bicarb of 19, AG 13 and Cr 1.7 (baseline 1.2).  Troponin 23> 27.  Twelve-lead EKG with sinus tachycardia, nonspecific T wave changes and right axis deviation.  CBC was not drawn.  RVP negative.  D-dimer 0.62 (appropriate for age).  Pro-Cal 0.11.  Lactic acid 1.5.  Cultures drawn.  CXR with marked severe gastric and esophageal distention.  CTA chest negative for PE.  CT abdomen and pelvis revealed large gastric hernia with associated esophageal dilation and mass-effect on trachea.  PCCM and CTS consulted.  Started on IV Unasyn by PCCM for possible aspiration pneumonia.   The next day, remained tachycardic with tachypnea.  GI consulted per recommendation by CTS  Subjective: Seen and examined this afternoon.  No major events overnight of this morning.  No complaints other than vague abdominal pain.  Denies chest pain, dyspnea, cough, dizziness, palpitation, nausea, vomiting, UTI symptoms or new focal neuro symptoms.  Objective: Vitals:   10/21/20 0945 10/21/20 1147 10/21/20 1206 10/21/20 1217  BP: 118/80 124/72 (!) 148/68 138/74  Pulse: (!) 104 (!) 116 (!) 104   Resp: (!) 31 (!) 22 (!) 29 (!) 27  Temp:    98 F (36.7 C)   TempSrc:      SpO2: 99% 99% 97%   Weight:      Height:        Intake/Output Summary (Last 24 hours) at 10/21/2020 1313 Last data filed at 10/21/2020 0550 Gross per 24 hour  Intake 1100 ml  Output --  Net 1100 ml   Filed Weights   10/20/20 2112  Weight: 77 kg    Examination:  GENERAL: No apparent distress.  Nontoxic. HEENT: MMM.  Vision and hearing grossly intact.  NECK: Supple.  No apparent JVD.  RESP: RR in upper 20s. Shallow breath.  CVS: HR in 110s.  Regular rhythm. Heart sounds normal.  ABD/GI/GU: BS+. Abd soft, NTND.  MSK/EXT:  Moves extremities. No apparent deformity. No edema.  SKIN: no apparent skin lesion or wound NEURO: Awake, alert and oriented appropriately.  No apparent focal neuro deficit other than known left hemiparesis PSYCH: Calm. Normal affect.  Procedures:  None  Microbiology summarized: COVID-19 PCR negative. Influenza PCR negative. Blood cultures pending.  Assessment & Plan: Syncopal fall: unclear etiology.   Witnessed by EMS.  Doubt ischemic etiology based on troponin and EKG.  No significant arrhythmia other than mild tachycardia.  Normotensive.  CTA negative.  No obvious signs of infection.  EEG without seizure or epileptiform discharge.  CT head without acute finding. -Continue cardiac monitoring -Check orthostatic vitals -Follow echocardiogram  Large gastric hernia with associated esophageal dilation and mass-effect on the trachea: Seen on CT abdomen and pelvis. -CTS and GI consulted. -Continue n.p.o. -Head of bed at 45 degree  Acute hypoxemic respiratory failure: Likely from airway compression by esophageal dilation.  Desaturated to 88% on RA and put on 3 L by EMS.  Has significant tachypnea.  No wheeze but shallow breaths.  CTA chest and chest x-ray reassuring.  -Continue supplemental oxygen -Appreciate input by PCCM-started IV Unasyn for possible aspiration pneumonia although this is less likely based on patient's clinical  picture.  Sinus tachycardia: Heart rate in 110s.   Blood pressure within normal.  Due to dehydration? -Check TSH level. -Low-dose metoprolol. -Continue IV fluid.  AKI/azotemia: Likely prerenal azotemia.  Resolving. -Continue IV fluid -Continue monitoring -Continue holding home Lotensin  Normal anion gap metabolic acidosis: Likely secondary to AKI.  Resolved.  Normocytic anemia: Baseline Hgb~>> 8.2.  Likely dilutional. -Check anemia panel -Continue monitoring  Hypertension: Normotensive. -Continue holding home Lotensin -Low-dose IV metoprolol for tachycardia  Hyperlipidemia -Resume home statin when able to take p.o.  Seizure disorder: EEG without seizure or epileptiform discharge.  On Depakote at home. -Continue home Depakote IV  Hypothyroidism -Resume home Synthroid when able to take p.o.  History of schizophrenia/intellectual disability: Stable -Resume home meds when able to take p.o.  History of CVA with residual left hemiparesis: Stable. -Continue home meds when he is able to take p.o.  GERD -IV Protonix 40 mg daily    Body mass index is 30.07 kg/m.         DVT prophylaxis:  SCDs Start: 10/21/20 0231  Code Status: Full code Family Communication: Updated patient's sister, [name] over the phone. Status is: Inpatient  Remains inpatient appropriate because:Hemodynamically unstable, Ongoing diagnostic testing needed not appropriate for outpatient work up, IV treatments appropriate due to intensity of illness or inability to take PO and Inpatient level of care appropriate due to severity of illness   Dispo: The patient is from: Group home              Anticipated d/c is to: To be determined              Anticipated d/c date is: 2 days              Patient currently is not medically stable to d/c.       Consultants:  PCCM Cardiothoracic surgery Gastroenterology   Sch Meds:  Scheduled Meds: . pantoprazole (PROTONIX) IV  40 mg Intravenous  Q24H   Continuous Infusions: . sodium chloride 125 mL/hr at 10/21/20 0307  . ampicillin-sulbactam (UNASYN) IV Stopped (10/21/20 0550)  . valproate sodium     PRN Meds:.albuterol, ondansetron (ZOFRAN) IV  Antimicrobials: Anti-infectives (From admission, onward)   Start     Dose/Rate Route Frequency Ordered Stop   10/21/20 0500  Ampicillin-Sulbactam (UNASYN) 3 g in sodium chloride 0.9 % 100 mL IVPB        3 g 200 mL/hr over 30 Minutes Intravenous Every 6 hours 10/21/20 0430         I have personally reviewed the following labs and images: CBC: Recent Labs  Lab 10/21/20 0516  WBC 6.5  NEUTROABS 4.6  HGB 8.2*  HCT 28.1*  MCV 87.5  PLT 231   BMP &GFR Recent Labs  Lab 10/20/20 2200 10/21/20 0327  NA 136 141  K 4.5 3.6  CL 104 111  CO2 19* 23  GLUCOSE 163* 112*  BUN 37* 27*  CREATININE 1.70* 1.20  CALCIUM 8.3* 7.1*   Estimated Creatinine Clearance: 58.6 mL/min (by C-G formula based on SCr of 1.2 mg/dL). Liver & Pancreas: Recent Labs  Lab 10/20/20 2200 10/21/20 0914  AST 58*  --   ALT 44  --  ALKPHOS 57  --   BILITOT 0.4  --   PROT 6.2*  --   ALBUMIN 3.2* 2.6*   No results for input(s): LIPASE, AMYLASE in the last 168 hours. No results for input(s): AMMONIA in the last 168 hours. Diabetic: No results for input(s): HGBA1C in the last 72 hours. No results for input(s): GLUCAP in the last 168 hours. Cardiac Enzymes: No results for input(s): CKTOTAL, CKMB, CKMBINDEX, TROPONINI in the last 168 hours. No results for input(s): PROBNP in the last 8760 hours. Coagulation Profile: No results for input(s): INR, PROTIME in the last 168 hours. Thyroid Function Tests: No results for input(s): TSH, T4TOTAL, FREET4, T3FREE, THYROIDAB in the last 72 hours. Lipid Profile: Recent Labs    10/20/20 2120  TRIG 103   Anemia Panel: Recent Labs    10/20/20 2200  FERRITIN 15*   Urine analysis:    Component Value Date/Time   COLORURINE YELLOW 09/07/2020 1902    APPEARANCEUR CLEAR 09/07/2020 1902   LABSPEC 1.012 09/07/2020 1902   PHURINE 7.0 09/07/2020 1902   GLUCOSEU NEGATIVE 09/07/2020 1902   HGBUR NEGATIVE 09/07/2020 1902   BILIRUBINUR NEGATIVE 09/07/2020 1902   KETONESUR 5 (A) 09/07/2020 1902   PROTEINUR NEGATIVE 09/07/2020 1902   UROBILINOGEN 1.0 07/23/2015 2300   NITRITE NEGATIVE 09/07/2020 1902   LEUKOCYTESUR NEGATIVE 09/07/2020 1902   Sepsis Labs: Invalid input(s): PROCALCITONIN, LACTICIDVEN  Microbiology: Recent Results (from the past 240 hour(s))  Respiratory Panel by RT PCR (Flu A&B, Covid) - Nasopharyngeal Swab     Status: None   Collection Time: 10/20/20  9:45 PM   Specimen: Nasopharyngeal Swab  Result Value Ref Range Status   SARS Coronavirus 2 by RT PCR NEGATIVE NEGATIVE Final    Comment: (NOTE) SARS-CoV-2 target nucleic acids are NOT DETECTED.  The SARS-CoV-2 RNA is generally detectable in upper respiratoy specimens during the acute phase of infection. The lowest concentration of SARS-CoV-2 viral copies this assay can detect is 131 copies/mL. A negative result does not preclude SARS-Cov-2 infection and should not be used as the sole basis for treatment or other patient management decisions. A negative result may occur with  improper specimen collection/handling, submission of specimen other than nasopharyngeal swab, presence of viral mutation(s) within the areas targeted by this assay, and inadequate number of viral copies (<131 copies/mL). A negative result must be combined with clinical observations, patient history, and epidemiological information. The expected result is Negative.  Fact Sheet for Patients:  https://www.moore.com/  Fact Sheet for Healthcare Providers:  https://www.young.biz/  This test is no t yet approved or cleared by the [place] FDA and  has been authorized for detection and/or diagnosis of SARS-CoV-2 by FDA under an Emergency Use Authorization  (EUA). This EUA will remain  in effect (meaning this test can be used) for the duration of the COVID-19 declaration under Section 564(b)(1) of the Act, 21 U.S.C. section 360bbb-3(b)(1), unless the authorization is terminated or revoked sooner.     Influenza A by PCR NEGATIVE NEGATIVE Final   Influenza B by PCR NEGATIVE NEGATIVE Final    Comment: (NOTE) The Xpert Xpress SARS-CoV-2/FLU/RSV assay is intended as an aid in  the diagnosis of influenza from Nasopharyngeal swab specimens and  should not be used as a sole basis for treatment. Nasal washings and  aspirates are unacceptable for Xpert Xpress SARS-CoV-2/FLU/RSV  testing.  Fact Sheet for Patients: https://www.moore.com/  Fact Sheet for Healthcare Providers: https://www.young.biz/  This test is not yet approved or cleared by the [place]  [place] FDA and  has been authorized for detection and/or diagnosis of SARS-CoV-2 by  FDA under an Emergency Use Authorization (EUA). This EUA will remain  in effect (meaning this test can be used) for the duration of the  Covid-19 declaration under Section 564(b)(1) of the Act, 21  U.S.C. section 360bbb-3(b)(1), unless the authorization is  terminated or revoked. Performed at [facility], [address] [place], [place] [address]     Radiology Studies: CT HEAD WO CONTRAST  Result Date: 10/21/2020 CLINICAL DATA:  Syncope EXAM: CT HEAD WITHOUT CONTRAST TECHNIQUE: Contiguous axial images were obtained from the base of the skull through the vertex without intravenous contrast. COMPARISON:  March 25, 2018 FINDINGS: Brain: No evidence of acute territorial infarction, hemorrhage, hydrocephalus,extra-axial collection or mass lesion/mass effect. There is ex vacuo dilatation of the right lateral ventricle. Low-attenuation changes in the deep white matter consistent with small vessel ischemia. Large area of encephalomalacia involving the right frontal lobe.  Vascular: No hyperdense vessel or unexpected calcification. Skull: Prior frontal craniotomy defect is noted. No fracture or focal lesion identified. Sinuses/Orbits: The visualized paranasal sinuses and mastoid air cells are clear. The orbits and globes intact. Other: None IMPRESSION: No acute intracranial abnormality. Large area encephalomalacia involving the right frontal lobe Electronically Signed   By: Jonna Clark M.D.   On: 10/21/2020 03:49   CT Angio Chest PE W/Cm &/Or Wo Cm  Result Date: 10/21/2020 CLINICAL DATA:  Status post fall with hypoxia and hiccups. EXAM: CT ANGIOGRAPHY CHEST CT ABDOMEN AND PELVIS WITH CONTRAST TECHNIQUE: Multidetector CT imaging of the chest was performed using the standard protocol during bolus administration of intravenous contrast. Multiplanar CT image reconstructions and MIPs were obtained to evaluate the vascular anatomy. Multidetector CT imaging of the abdomen and pelvis was performed using the standard protocol during bolus administration of intravenous contrast. CONTRAST:  15mL OMNIPAQUE IOHEXOL 350 MG/ML SOLN COMPARISON:  None. FINDINGS: CT CHEST FINDINGS Cardiovascular: No significant vascular findings. Normal heart size. No pericardial effusion. Mediastinum/Nodes: No enlarged mediastinal, hilar, or axillary lymph nodes. The esophagus is markedly dilated (approximately 5.9 cm x 3.9 cm) and extends inferiorly into a large gastric hernia. Subsequent mass effect is seen on the trachea with anterior tracheal displacement noted. Lungs/Pleura: Very mild atelectasis is seen within the posteromedial aspect of the right upper lobe. There is no evidence of acute infiltrate, pleural effusion or pneumothorax. Musculoskeletal: No chest wall mass or suspicious bone lesions identified. CT ABDOMEN PELVIS FINDINGS Hepatobiliary: No focal liver abnormality is seen. No gallstones, gallbladder wall thickening, or biliary dilatation. Pancreas: Unremarkable. No pancreatic ductal dilatation  or surrounding inflammatory changes. Spleen: Normal in size without focal abnormality. Adrenals/Urinary Tract: Adrenal glands are unremarkable. Kidneys are normal, without renal calculi, focal lesion, or hydronephrosis. Bladder is unremarkable. Stomach/Bowel: A large, stable gastric hernia is seen. Appendix appears normal. No evidence of bowel wall thickening, distention, or inflammatory changes. Small, noninflamed diverticula are seen within the sigmoid colon. Vascular/Lymphatic: Aortic atherosclerosis. No enlarged abdominal or pelvic lymph nodes. Reproductive: Prostate is unremarkable. Other: No abdominal wall hernia or abnormality. No abdominopelvic ascites. Musculoskeletal: No acute or significant osseous findings. IMPRESSION: 1. Large, stable gastric hernia with associated esophageal dilatation and subsequent mass effect on the trachea. 2. Very mild right upper lobe atelectasis. 3. Sigmoid diverticulosis. 4. Aortic atherosclerosis. Aortic Atherosclerosis (ICD10-I70.0). Electronically Signed   By: Aram Candela M.D.   On: 10/21/2020 00:20   CT ABDOMEN PELVIS W CONTRAST  Result Date: 10/21/2020 CLINICAL DATA:  Unwitnessed fall with hypoxia, hiccups and coughing. EXAM: CT CHEST, ABDOMEN, AND PELVIS WITH CONTRAST TECHNIQUE: Multidetector CT imaging of the chest, abdomen and pelvis was performed following the standard protocol during bolus administration of intravenous contrast. CONTRAST:  75mL OMNIPAQUE IOHEXOL 350 MG/ML SOLN COMPARISON:  Abdomen pelvis CT, dated September 05, 2020 FINDINGS: CT CHEST FINDINGS Cardiovascular: No significant vascular findings. Normal heart size. No pericardial effusion. Mediastinum/Nodes: No enlarged mediastinal, hilar, or axillary lymph nodes. The esophagus is markedly dilated (approximately 5.9 cm x 3.9 cm) and extends inferiorly into a large gastric hernia. Subsequent mass effect is seen on the trachea with anterior tracheal displacement noted. Lungs/Pleura: Very mild  atelectasis is seen within the posteromedial aspect of the right upper lobe. There is no evidence of acute infiltrate, pleural effusion or pneumothorax. Musculoskeletal: No chest wall mass or suspicious bone lesions identified. CT ABDOMEN PELVIS FINDINGS Hepatobiliary: No focal liver abnormality is seen. No gallstones, gallbladder wall thickening, or biliary dilatation. Pancreas: Unremarkable. No pancreatic ductal dilatation or surrounding inflammatory changes. Spleen: Normal in size without focal abnormality. Adrenals/Urinary Tract: Adrenal glands are unremarkable. Kidneys are normal, without renal calculi, focal lesion, or hydronephrosis. Bladder is unremarkable. Stomach/Bowel: A large, stable gastric hernia is seen. Appendix appears normal. No evidence of bowel wall thickening, distention, or inflammatory changes. Small, noninflamed diverticula are seen within the sigmoid colon. Vascular/Lymphatic: Aortic atherosclerosis. No enlarged abdominal or pelvic lymph nodes. Reproductive: Prostate is unremarkable. Other: No abdominal wall hernia or abnormality. No abdominopelvic ascites. Musculoskeletal: No acute or significant osseous findings. IMPRESSION: 1. Large, stable gastric hernia with associated esophageal dilatation and subsequent mass effect on the trachea. 2. Very mild right upper lobe atelectasis. 3. Sigmoid diverticulosis. 4. Aortic atherosclerosis. Aortic Atherosclerosis (ICD10-I70.0). Electronically Signed   By: Aram Candela M.D.   On: 10/21/2020 00:13   DG Chest Port 1 View  Result Date: 10/20/2020 CLINICAL DATA:  Hypoxia and cough. EXAM: PORTABLE CHEST 1 VIEW COMPARISON:  September 05, 2020 FINDINGS: Low lung volumes are seen which is likely, in part, secondary to the degree of patient inspiration. There is no evidence of acute infiltrate, pleural effusion or pneumothorax. The heart size and mediastinal contours are within normal limits. A markedly distended and tortuous air-filled esophagus is  seen. Marked severity gastric distension is also present. The visualized skeletal structures are unremarkable. IMPRESSION: 1. Low lung volumes without evidence of acute infiltrate. 2. Marked severity gastric and esophageal distention. Sequelae associated with gastric outlet or small bowel obstruction cannot be excluded. Correlation with abdomen pelvis CT is recommended. Electronically Signed   By: Aram Candela M.D.   On: 10/20/2020 21:44   EEG adult  Result Date: 10/21/2020 Charlsie Quest, MD     10/21/2020 12:29 PM Patient Name: [name] MRN: [id number] Epilepsy Attending: Charlsie Quest Referring Physician/Provider: Dr Eligha Bridegroom Date: 10/21/2020 Duration: 25.58 mins Patient history: 64 year old male with syncope.  EEG to evaluate for seizures. Level of alertness: Awake, drowsy AEDs during EEG study: VPA Technical aspects: This EEG study was done with scalp electrodes positioned according to the 10-20 International system of electrode placement. Electrical activity was acquired at a sampling rate of  and reviewed with a high frequency filter of  and a low frequency filter of . EEG data were recorded continuously and digitally stored. Description: The posterior dominant rhythm consists of 6.5 Hz activity of moderate voltage (25-35 uV) seen predominantly in posterior head regions, symmetric and reactive to eye opening and eye closing.  Drowsiness is characterized  by attenuation of the posterior dominant rhythm.  EEG showed continuous generalized 3 to 6 Hz theta-delta slowing. Physiologic photic driving was not seen during photic stimulation.  Hyperventilation was not performed.   ABNORMALITY -Continuous slow, generalized - Background slow IMPRESSION: This study is suggestive of mild to moderate diffuse encephalopathy, nonspecific etiology. No seizures or epileptiform discharges were seen throughout the recording. Priyanka Annabelle Harman     Gurjot Brisco T. Camira Geidel [facility]  If 7PM-7AM, please contact night-coverage [email or web address] 10/21/2020, 1:13 PM

## 2020-10-21 NOTE — Anesthesia Postprocedure Evaluation (Signed)
Anesthesia Post Note  Patient: [name]  Procedure(s) Performed: ESOPHAGOGASTRODUODENOSCOPY (EGD) WITH PROPOFOL (N/A ) BIOPSY     Patient location during evaluation: Endoscopy Anesthesia Type: MAC Level of consciousness: awake Pain management: pain level controlled Vital Signs Assessment: post-procedure vital signs reviewed and stable Respiratory status: spontaneous breathing, nonlabored ventilation, respiratory function stable and patient connected to nasal cannula oxygen Cardiovascular status: stable and blood pressure returned to baseline Postop Assessment: no apparent nausea or vomiting Anesthetic complications: no   No complications documented.  Last Vitals:  Vitals:   10/21/20 1636 10/21/20 1939  BP: (!) 132/57 125/73  Pulse: (!) 111 (!) 109  Resp: (!) 27 (!) 28  Temp:  37.6 C  SpO2: 93% 96%    Last Pain:  Vitals:   10/21/20 1939  TempSrc: Oral  PainSc:                  Karyl Kinnier Sena Clouatre

## 2020-10-21 NOTE — Transfer of Care (Signed)
Immediate Anesthesia Transfer of Care Note  Patient: [name]  Procedure(s) Performed: ESOPHAGOGASTRODUODENOSCOPY (EGD) WITH PROPOFOL (N/A ) BIOPSY  Patient Location: Endoscopy Unit  Anesthesia Type:MAC  Level of Consciousness: awake  Airway & Oxygen Therapy: Patient Spontanous Breathing and Patient connected to nasal cannula oxygen  Post-op Assessment: Report given to RN  Post vital signs: Reviewed and stable  Last Vitals:  Vitals Value Taken Time  BP 152/86 10/21/20 1616  Temp    Pulse 117 10/21/20 1617  Resp 35 10/21/20 1617  SpO2 92 % 10/21/20 1617  Vitals shown include unvalidated device data.  Last Pain:  Vitals:   10/21/20 1523  TempSrc: Oral  PainSc: 0-No pain         Complications: No complications documented.

## 2020-10-21 NOTE — Progress Notes (Signed)
     [address]       [place] [address]             [phone]       Full consult note to follow. Images and chart reviewed.  This is a 64 year old gentleman presents with abdominal pain and hiccups.  On review of his imaging his esophagus was significantly dilated from the proximal cervical portion down through the mediastinum.  He also has a very large hiatal hernia.  This is a chronic issue, but what is concerning is the dilation of his esophagus.  He will need a thorough evaluation by gastroenterology to ensure that he does not have some type of esophageal mass in addition to the hiatal hernia.  His last upper endoscopy was done in 2016 reviewed as noted to have a 7 cm hiatal hernia, along with erosive esophagitis and Barrett's esophagitis.  It is quite possible that he has developed a mass which is partially obstructing the generally leading to the proximal dilation of his esophagus.  I will speak with Dr. Meridee Score further evaluation and work-up as an inpatient.

## 2020-10-22 ENCOUNTER — Inpatient Hospital Stay (HOSPITAL_COMMUNITY): Payer: Medicare Other

## 2020-10-22 DIAGNOSIS — R6521 Severe sepsis with septic shock: Secondary | ICD-10-CM

## 2020-10-22 DIAGNOSIS — A419 Sepsis, unspecified organism: Secondary | ICD-10-CM

## 2020-10-22 DIAGNOSIS — J9601 Acute respiratory failure with hypoxia: Secondary | ICD-10-CM | POA: Diagnosis not present

## 2020-10-22 LAB — GLUCOSE, CAPILLARY
Glucose-Capillary: 115 mg/dL — ABNORMAL HIGH (ref 70–99)
Glucose-Capillary: 119 mg/dL — ABNORMAL HIGH (ref 70–99)
Glucose-Capillary: 126 mg/dL — ABNORMAL HIGH (ref 70–99)
Glucose-Capillary: 131 mg/dL — ABNORMAL HIGH (ref 70–99)
Glucose-Capillary: 147 mg/dL — ABNORMAL HIGH (ref 70–99)

## 2020-10-22 LAB — CBC
HCT: 28.6 % — ABNORMAL LOW (ref 39.0–52.0)
Hemoglobin: 8.6 g/dL — ABNORMAL LOW (ref 13.0–17.0)
MCH: 25.7 pg — ABNORMAL LOW (ref 26.0–34.0)
MCHC: 30.1 g/dL (ref 30.0–36.0)
MCV: 85.4 fL (ref 80.0–100.0)
Platelets: 237 K/uL (ref 150–400)
RBC: 3.35 MIL/uL — ABNORMAL LOW (ref 4.22–5.81)
RDW: 16.3 % — ABNORMAL HIGH (ref 11.5–15.5)
WBC: 7.2 K/uL (ref 4.0–10.5)
nRBC: 0 % (ref 0.0–0.2)

## 2020-10-22 LAB — CORTISOL: Cortisol, Plasma: 10.7 ug/dL

## 2020-10-22 LAB — POCT I-STAT 7, (LYTES, BLD GAS, ICA,H+H)
Acid-Base Excess: 2 mmol/L (ref 0.0–2.0)
Bicarbonate: 27.8 mmol/L (ref 20.0–28.0)
Calcium, Ion: 1.16 mmol/L (ref 1.15–1.40)
HCT: 27 % — ABNORMAL LOW (ref 39.0–52.0)
Hemoglobin: 9.2 g/dL — ABNORMAL LOW (ref 13.0–17.0)
O2 Saturation: 94 %
Patient temperature: 100.7
Potassium: 4 mmol/L (ref 3.5–5.1)
Sodium: 141 mmol/L (ref 135–145)
TCO2: 29 mmol/L (ref 22–32)
pCO2 arterial: 53 mmHg — ABNORMAL HIGH (ref 32.0–48.0)
pH, Arterial: 7.333 — ABNORMAL LOW (ref 7.350–7.450)
pO2, Arterial: 79 mmHg — ABNORMAL LOW (ref 83.0–108.0)

## 2020-10-22 LAB — RENAL FUNCTION PANEL
Albumin: 3 g/dL — ABNORMAL LOW (ref 3.5–5.0)
Anion gap: 11 (ref 5–15)
BUN: 18 mg/dL (ref 8–23)
CO2: 25 mmol/L (ref 22–32)
Calcium: 8.2 mg/dL — ABNORMAL LOW (ref 8.9–10.3)
Chloride: 103 mmol/L (ref 98–111)
Creatinine, Ser: 1.18 mg/dL (ref 0.61–1.24)
GFR, Estimated: >60 mL/min (ref >60)
Glucose, Bld: 116 mg/dL — ABNORMAL HIGH (ref 70–99)
Phosphorus: 3.5 mg/dL (ref 2.5–4.6)
Potassium: 4.3 mmol/L (ref 3.5–5.1)
Sodium: 139 mmol/L (ref 135–145)

## 2020-10-22 LAB — TRIGLYCERIDES: Triglycerides: 110 mg/dL

## 2020-10-22 LAB — MRSA PCR SCREENING: MRSA by PCR: NEGATIVE

## 2020-10-22 LAB — MAGNESIUM: Magnesium: 2.1 mg/dL (ref 1.7–2.4)

## 2020-10-22 LAB — HEMOGLOBIN A1C
Hgb A1c MFr Bld: 5.8 % — ABNORMAL HIGH (ref 4.8–5.6)
Mean Plasma Glucose: 119.76 mg/dL

## 2020-10-22 MED ORDER — HALOPERIDOL LACTATE 5 MG/ML IJ SOLN
2.0000 mg | Freq: Once | INTRAMUSCULAR | Status: AC
  Administered 2020-10-22: 2 mg via INTRAMUSCULAR
  Filled 2020-10-22: qty 1

## 2020-10-22 MED ORDER — FENTANYL CITRATE (PF) 100 MCG/2ML IJ SOLN
50.0000 ug | Freq: Once | INTRAMUSCULAR | Status: AC
  Administered 2020-10-22: 50 ug via INTRAVENOUS
  Filled 2020-10-22: qty 2

## 2020-10-22 MED ORDER — CHLORHEXIDINE GLUCONATE CLOTH 2 % EX PADS
6.0000 | MEDICATED_PAD | Freq: Every day | CUTANEOUS | Status: DC
  Administered 2020-10-22 – 2020-10-27 (×9): 6 via TOPICAL

## 2020-10-22 MED ORDER — FENTANYL CITRATE (PF) 100 MCG/2ML IJ SOLN
INTRAMUSCULAR | Status: AC
  Filled 2020-10-22: qty 2

## 2020-10-22 MED ORDER — ROCURONIUM BROMIDE 10 MG/ML (PF) SYRINGE
1.0000 mg/kg | PREFILLED_SYRINGE | Freq: Once | INTRAVENOUS | Status: AC
  Administered 2020-10-22: 77 mg via INTRAVENOUS

## 2020-10-22 MED ORDER — RACEPINEPHRINE HCL 2.25 % IN NEBU
0.5000 mL | INHALATION_SOLUTION | RESPIRATORY_TRACT | Status: DC
  Filled 2020-10-22: qty 0.5

## 2020-10-22 MED ORDER — NOREPINEPHRINE 4 MG/250ML-% IV SOLN
2.0000 ug/min | INTRAVENOUS | Status: DC
  Administered 2020-10-22: 2 ug/min via INTRAVENOUS
  Filled 2020-10-22: qty 250

## 2020-10-22 MED ORDER — CHLORHEXIDINE GLUCONATE 0.12% ORAL RINSE (MEDLINE KIT)
15.0000 mL | Freq: Two times a day (BID) | OROMUCOSAL | Status: DC
  Administered 2020-10-22 – 2020-11-15 (×48): 15 mL via OROMUCOSAL

## 2020-10-22 MED ORDER — PROPOFOL 1000 MG/100ML IV EMUL
5.0000 ug/kg/min | INTRAVENOUS | Status: DC
  Administered 2020-10-22: 10 ug/kg/min via INTRAVENOUS
  Administered 2020-10-22: 35 ug/kg/min via INTRAVENOUS
  Administered 2020-10-22: 40 ug/kg/min via INTRAVENOUS
  Administered 2020-10-22: 35 ug/kg/min via INTRAVENOUS
  Administered 2020-10-23 (×2): 30 ug/kg/min via INTRAVENOUS
  Administered 2020-10-23: 20 ug/kg/min via INTRAVENOUS
  Administered 2020-10-24: 30 ug/kg/min via INTRAVENOUS
  Administered 2020-10-24: 25 ug/kg/min via INTRAVENOUS
  Filled 2020-10-22 (×7): qty 100
  Filled 2020-10-22: qty 200

## 2020-10-22 MED ORDER — SODIUM CHLORIDE 0.9 % IV SOLN
250.0000 mL | INTRAVENOUS | Status: DC
  Administered 2020-10-29: 250 mL via INTRAVENOUS

## 2020-10-22 MED ORDER — ORAL CARE MOUTH RINSE: 15.0000 mL | OROMUCOSAL | Status: DC

## 2020-10-22 MED ORDER — SODIUM CHLORIDE 0.9% FLUSH: 10.0000 mL | INTRAVENOUS | Status: DC | PRN

## 2020-10-22 MED ORDER — ETOMIDATE 2 MG/ML IV SOLN
20.0000 mg | Freq: Once | INTRAVENOUS | Status: AC
  Administered 2020-10-22: 20 mg via INTRAVENOUS

## 2020-10-22 MED ORDER — ORAL CARE MOUTH RINSE
15.0000 mL | OROMUCOSAL | Status: DC
  Administered 2020-10-22 – 2020-11-05 (×136): 15 mL via OROMUCOSAL

## 2020-10-22 MED ORDER — ACETAMINOPHEN 650 MG RE SUPP
650.0000 mg | RECTAL | Status: DC | PRN
  Administered 2020-10-22 – 2020-11-04 (×2): 650 mg via RECTAL
  Filled 2020-10-22 (×3): qty 1

## 2020-10-22 MED ORDER — FENTANYL CITRATE (PF) 100 MCG/2ML IJ SOLN: 50.0000 ug | Freq: Once | INTRAMUSCULAR | Status: DC

## 2020-10-22 MED ORDER — LEVOTHYROXINE SODIUM 100 MCG/5ML IV SOLN: 37.5000 ug | Freq: Every day | INTRAVENOUS | Status: DC

## 2020-10-22 MED ORDER — CHLORHEXIDINE GLUCONATE 0.12% ORAL RINSE (MEDLINE KIT): 15.0000 mL | Freq: Two times a day (BID) | OROMUCOSAL | Status: DC

## 2020-10-22 MED ORDER — FENTANYL 2500MCG IN NS 250ML (10MCG/ML) PREMIX INFUSION
50.0000 ug/h | INTRAVENOUS | Status: DC
  Administered 2020-10-22: 50 ug/h via INTRAVENOUS
  Administered 2020-10-22 – 2020-10-23 (×3): 200 ug/h via INTRAVENOUS
  Administered 2020-10-24: 150 ug/h via INTRAVENOUS
  Administered 2020-10-25 (×2): 50 ug/h via INTRAVENOUS
  Administered 2020-10-27: 150 ug/h via INTRAVENOUS
  Filled 2020-10-22 (×8): qty 250

## 2020-10-22 MED ORDER — MIDAZOLAM HCL 2 MG/2ML IJ SOLN
INTRAMUSCULAR | Status: AC
  Filled 2020-10-22: qty 2

## 2020-10-22 MED ORDER — SODIUM CHLORIDE 0.9 % IV BOLUS
1000.0000 mL | Freq: Once | INTRAVENOUS | Status: AC
  Administered 2020-10-22: 1000 mL via INTRAVENOUS

## 2020-10-22 MED ORDER — ACETAMINOPHEN 10 MG/ML IV SOLN
1000.0000 mg | Freq: Once | INTRAVENOUS | Status: AC
  Administered 2020-10-22: 1000 mg via INTRAVENOUS
  Filled 2020-10-22: qty 100

## 2020-10-22 MED ORDER — LACTATED RINGERS IV SOLN: INTRAVENOUS | Status: DC

## 2020-10-22 MED ORDER — NOREPINEPHRINE 4 MG/250ML-% IV SOLN: 0.0000 ug/min | INTRAVENOUS | Status: DC

## 2020-10-22 MED ORDER — FENTANYL BOLUS VIA INFUSION
50.0000 ug | INTRAVENOUS | Status: DC | PRN
  Administered 2020-10-22 – 2020-10-27 (×11): 50 ug via INTRAVENOUS
  Filled 2020-10-22: qty 50

## 2020-10-22 MED ORDER — NOREPINEPHRINE 16 MG/250ML-% IV SOLN
0.0000 ug/min | INTRAVENOUS | Status: DC
  Administered 2020-10-22: 15 ug/min via INTRAVENOUS
  Administered 2020-10-23: 9 ug/min via INTRAVENOUS
  Administered 2020-10-23: 10 ug/min via INTRAVENOUS
  Administered 2020-10-25: 5 ug/min via INTRAVENOUS
  Filled 2020-10-22 (×3): qty 250

## 2020-10-22 NOTE — Progress Notes (Signed)
   Overnight events noted.  Perhaps his hiatal hernia may need surgery - I was recommending exercising caution yesterday and still do but if we are more convinced these episodes are from the hiatal hernia would be appropriate.  I am a bit puzzled as to why now since he has had the large hernia x yrs.

## 2020-10-22 NOTE — Progress Notes (Signed)
Pt continued to be tachypnea and tachycardia and more agitation, ativan and haldol was given as per MD order, seemed to calm down but still tachy with difficulty breathing, Rapid RN was notified and PCCM MD was made aware and gave order to transfer pt to ICU for better monitoring, [name] (Pt's Sister) was made aware of the transfer.

## 2020-10-22 NOTE — Procedures (Signed)
Intubation Procedure Note  [name]  [id number]  1956-01-10  Date:10/22/20  Time:5:09 AM   Provider Performing:Fergie Sherbert M Winslow Ederer    Procedure: Intubation (31500)  Indication(s) Respiratory Failure  Consent Unable to obtain consent due to emergent nature of procedure.   Anesthesia    Time Out Verified patient identification, verified procedure, site/side was marked, verified correct patient position, special equipment/implants available, medications/allergies/relevant history reviewed, required imaging and test results available.   Sterile Technique Usual hand hygeine, masks, and gloves were used   Procedure Description Patient positioned in bed supine.  Sedation given as noted above.  Patient was intubated with endotracheal tube using Glidescope.  View was Grade 1 full glottis .  Number of attempts was 1.  Colorimetric CO2 detector was consistent with tracheal placement.   Complications/Tolerance None; patient tolerated the procedure well. Chest X-ray is ordered to verify placement.   EBL    Specimen(s) None

## 2020-10-22 NOTE — Progress Notes (Signed)
NAME:  [name], MRN:  [id number], DOB:  [birth year], LOS: 1 ADMISSION DATE:  10/20/2020, CONSULTATION DATE:  10/29 REFERRING MD:  Dr. Loney Loh, CHIEF COMPLAINT:  Dyspnea   Brief History   64 year old male presented to ED after syncopal episode and was found to have large hiatal hernia compressing trachea.   Past Medical History  GERD, Gastroparesis, Barrett's esophagus, HLD, HTN, Iron deficiency anemia, Seizures, Schizophrenia, Developmental delay  Significant Hospital Events   10/29 admit, EGD done 10/30 transferred to ICU for respiratory distress, intubated.   Consults:  CVTS GI  Procedures:  ETT 10/30 >> Lt [place] CVL 10/30 >>   Significant Diagnostic Tests:  CTA chest 10/29 > no PE, RUL ATX CT abd/pelvis 10/29  > large, stable gastric hernia with associated esophageal dilation and subsequent mass effect on the trachea.    Micro Data:  COVID 10/28 >> negative MRSA PCR 10/28 >> negative Blood 10/28 >>  Sputum 10/30 >>   Antimicrobials:  Unasyn 10/28 >>   Interim history/subjective:  Fever this AM 103.5.  Progressive hypotension requiring fluid boluses and pressors.  Objective   Blood pressure (!) 84/57, pulse 100, temperature (!) 101.1 F (38.4 C), resp. rate 11, height (S) 5\' 4"  (1.626 m), weight 77 kg, SpO2 100 %.    Vent Mode: PRVC FiO2 (%):  [100 %] 100 % Set Rate:  [16 bmp-20 bmp] 20 bmp Vt Set:  [410 mL-470 mL] 410 mL PEEP:  [5 cmH20-8 cmH20] 8 cmH20 Plateau Pressure:  [20 cmH20-26 cmH20] 20 cmH20   Intake/Output Summary (Last 24 hours) at 10/22/2020 1610 Last data filed at 10/22/2020 1400 Gross per 24 hour  Intake 1933.17 ml  Output 700 ml  Net 1233.17 ml   Filed Weights   10/20/20 2112  Weight: 77 kg    Examination:  General - sedated Eyes - pupils reactive ENT - ETT in place Cardiac - regular, tachycardic Chest - b/l crackles Abdomen - soft, non tender, + bowel sounds Extremities - no cyanosis, clubbing, or edema Skin - no  rashes Neuro - RASS -2  Resolved Hospital Problem list     Assessment & Plan:   Acute hypoxic/hypercapnic respiratory failure 2nd to aspiration pneumonia. - full vent support - f/u CXR - day 3 of Abx  Septic shock from aspiration pneumonia. - pressors to keep MAP > 65 - continue IV fluids - check cortisol  Large hiatal hernia. GERD, Barrett's esophagus, gastroparesis. - GI and cardiothoracic surgery consulted  Anemia of critical illness. - f/u CBC - transfuse for Hb < 7 or significant bleeding  Acute metabolic encephalopathy 2nd to sepsis, hypoxia, hypercapnia.  Seizure disorder, Schizophrenia. - RASS goal 0 to -1 - valproate IV  Hypothyroidism. - Synthroid IV  D/w Dr. 2113  Best practice:  Diet: NPO DVT prophylaxis: lovenox GI prophylaxis: protonix Mobility: bed rest Code Status: full Disposition: ICU  Labs    CMP Latest Ref Rng & Units 10/22/2020 10/22/2020 10/21/2020  Glucose 70 - 99 mg/dL - 10/23/2020) 625(W)  BUN 8 - 23 mg/dL - 18 389(H)  Creatinine 0.61 - 1.24 mg/dL - 73(S 2.87  Sodium 6.81 - 145 mmol/L 141 139 141  Potassium 3.5 - 5.1 mmol/L 4.0 4.3 3.6  Chloride 98 - 111 mmol/L - 103 111  CO2 22 - 32 mmol/L - 25 23  Calcium 8.9 - 10.3 mg/dL - 8.2(L) 7.1(L)  Total Protein 6.5 - 8.1 g/dL - - -  Total Bilirubin 0.3 - 1.2 mg/dL - - -  Alkaline Phos 38 - 126 U/L - - -  AST 15 - 41 U/L - - -  ALT 0 - 44 U/L - - -    CBC Latest Ref Rng & Units 10/22/2020 10/22/2020 10/21/2020  WBC 4.0 - 10.5 K/uL - 7.2 6.5  Hemoglobin 13.0 - 17.0 g/dL 1.6(W) 7.3(X) 1.0(G)  Hematocrit 39 - 52 % 27.0(L) 28.6(L) 28.1(L)  Platelets 150 - 400 K/uL - 237 231    CBG (last 3)  Recent Labs    10/22/20 0631 10/22/20 1149  GLUCAP 115* 126*    ABG    Component Value Date/Time   PHART 7.333 (L) 10/22/2020 0620   PCO2ART 53.0 (H) 10/22/2020 0620   PO2ART 79 (L) 10/22/2020 0620   HCO3 27.8 10/22/2020 0620   TCO2 29 10/22/2020 0620   ACIDBASEDEF 1.0 10/05/2007 1720    O2SAT 94.0 10/22/2020 0620    Critical care time: 46 minutes independent of procedure time.  Coralyn Helling, MD [facility] Pulmonary/Critical Care Pager - [phone] 10/22/2020, 4:20 PM

## 2020-10-22 NOTE — Consult Note (Signed)
NAME:  [name], MRN:  [id number], DOB:  November 29, 1956, LOS: 1 ADMISSION DATE:  10/20/2020, CONSULTATION DATE:  10/29 REFERRING MD:  Dr. Loney Loh, CHIEF COMPLAINT:  Dyspnea   Brief History   64 year old male presented to ED after syncopal episode and was found to have large hiatal hernia compressing trachea.   History of present illness   64 year old male with PMH as below, which is significant for GERD, Barrett esophagus, HTN, stroke with L sided deficits, and schizophrenia. History of intellectual disability also reported. Patient is a difficult historian. He resides in group home where he had a witnessed episode of syncope and fall on 10/29 early AM. Syncopal with EMS as well. Upon arrival to the ED he was found to be tachycardic and tachypneic. Hypoxic to 88% on room air. Vomiting in ED as well. Imaging included CTA of the chest and CT abdomen demonstrating large hiatal hernia with associated esophageal dilation mass effect on the trachea. Given these findings with associated hypoxia and tachypnea, PCCM was consulted 10/29.  Patient had rapid response called on him early morning 10/30 for worsening tachypnea and upper airway wheezes, patient using accessory muscles.  He had a desaturation from 95 to 80% on nasal cannula- subsequently placed on non-rebreather.  On arrival to the ICU, patient was still tachpneic, using accessory muscles.  Decision made to intubate   Past Medical History   has a past medical history of Barrett esophagus, Dysphagia, Gastroparesis, GERD (gastroesophageal reflux disease), Hypercholesteremia, Hypertension, Iron deficiency anemia, Mental retardation, Schizophrenia (HCC), and Seizure (HCC).  Significant Hospital Events   10/29 - admit 10/29- EGD done 10/30- transferred to ICU for respiratory distress, intubated.   Consults:  PCCM CVTS GI  Procedures:  EGD 10/29 Intubation 10/30  Significant Diagnostic Tests:  CTA chest 10/29 > no PE, RUL ATX CT  abd/pelvis 10/29  > large, stable gastric hernia with associated esophageal dilation and subsequent mass effect on the trachea.    Micro Data:  Blood cultures 10/28 >> Res  Antimicrobials:  Unasyn    Interim history/subjective:    Objective   Blood pressure (!) 152/82, pulse (!) 115, temperature 98.1 F (36.7 C), temperature source Oral, resp. rate 16, height (S) 5\' 4"  (1.626 m), weight 77 kg, SpO2 100 %.    Vent Mode: PRVC FiO2 (%):  [100 %] 100 % Set Rate:  [16 bmp] 16 bmp Vt Set:  [470 mL] 470 mL PEEP:  [5 cmH20] 5 cmH20 Plateau Pressure:  [24 cmH20] 24 cmH20   Intake/Output Summary (Last 24 hours) at 10/22/2020 0526 Last data filed at 10/22/2020 0432 Gross per 24 hour  Intake 1402.5 ml  Output 225 ml  Net 1177.5 ml   Filed Weights   10/20/20 2112  Weight: 77 kg    Examination: General: adult male, tachypneic, using accessory muscles HENT: [place]/AT, PERRL, no JVD Lungs: Expiratory upper airway wheeze. tachypeneic.  Cardiovascular: Mildly tahcycardic, regular, no MRG Abdomen: Soft, non-tender, non-distended Extremities: No acute deformity Neuro: more lethargic, barely arousable.    Resolved Hospital Problem list     Assessment & Plan:   Acute respiratory failure with hypoxia: multifactorial in the setting of aspiration pneumonia and structural effects of hiatal hernia.  Worsening tachypnea and hypoxia due to continuous aspiration of gastric contents.  Requiring intubation  - vent setting with TV at 8cc/kg, repeat abg - continue aspiration pneumonia antibiotics coverage- unasyn - CVTS consultation    Large gastric hernia: has esophageal dilation and compression  on trachea.  - CVTS consulted. S/p EGD today- biopsies taken, no large mass seen.    Syncope: head CT not worrisome.  TTE within normal limits.    AKI: improving.  Likely prerenal AKI  Seizure disorder: restart home meds when med rec complete.     Best practice:  Diet: NPO Pain/Anxiety/Delirium  protocol (if indicated): yes- prn fentanyl VAP protocol (if indicated): yes DVT prophylaxis: SCDs, follow up surgery plan GI prophylaxis: PPI Glucose control: NA Mobility: BR Code Status: FULL Family Communication: per primary Disposition: ICU  Labs   CBC: Recent Labs  Lab 10/21/20 0516 10/22/20 0346  WBC 6.5 7.2  NEUTROABS 4.6  --   HGB 8.2* 8.6*  HCT 28.1* 28.6*  MCV 87.5 85.4  PLT 231 237    Basic Metabolic Panel: Recent Labs  Lab 10/20/20 2200 10/21/20 0327 10/22/20 0346  NA 136 141 139  K 4.5 3.6 4.3  CL 104 111 103  CO2 19* 23 25  GLUCOSE 163* 112* 116*  BUN 37* 27* 18  CREATININE 1.70* 1.20 1.18  CALCIUM 8.3* 7.1* 8.2*  MG  --   --  2.1  PHOS  --   --  3.5   GFR: Estimated Creatinine Clearance: 60.9 mL/min (by C-G formula based on SCr of 1.18 mg/dL). Recent Labs  Lab 10/20/20 2200 10/20/20 2228 10/20/20 2320 10/21/20 0516 10/22/20 0346  PROCALCITON 0.11  --   --   --   --   WBC  --   --   --  6.5 7.2  LATICACIDVEN  --  1.5 1.4  --   --     Liver Function Tests: Recent Labs  Lab 10/20/20 2200 10/21/20 0914 10/22/20 0346  AST 58*  --   --   ALT 44  --   --   ALKPHOS 57  --   --   BILITOT 0.4  --   --   PROT 6.2*  --   --   ALBUMIN 3.2* 2.6* 3.0*   No results for input(s): LIPASE, AMYLASE in the last 168 hours. No results for input(s): AMMONIA in the last 168 hours.  ABG    Component Value Date/Time   PHART 7.409 10/06/2007 0500   PCO2ART 42.3 10/06/2007 0500   PO2ART 103.0 (H) 10/06/2007 0500   HCO3 26.2 (H) 10/06/2007 0500   TCO2 23 09/05/2020 1152   ACIDBASEDEF 1.0 10/05/2007 1720   O2SAT 97.3 10/06/2007 0500     Coagulation Profile: No results for input(s): INR, PROTIME in the last 168 hours.  Cardiac Enzymes: No results for input(s): CKTOTAL, CKMB, CKMBINDEX, TROPONINI in the last 168 hours.  HbA1C: Hgb A1c MFr Bld  Date/Time Value Ref Range Status  10/22/2020 03:46 AM 5.8 (H) 4.8 - 5.6 % Final    Comment:     (NOTE) Pre diabetes:          5.7%-6.4%  Diabetes:              >6.4%  Glycemic control for   <7.0% adults with diabetes   10/05/2007 06:17 AM   Final   5.4 (NOTE)   The ADA recommends the following therapeutic goals for glycemic   control related to Hgb A1C measurement:   Goal of Therapy:   < 7.0% Hgb A1C   Action Suggested:  > 8.0% Hgb A1C   Ref:  Diabetes Care, 22, Suppl. 1, 1999    CBG: No results for input(s): GLUCAP in the last 168 hours.  Review of  Systems:   Bolds are positive  Constitutional: weight loss, gain, night sweats, Fevers, chills, fatigue .  HEENT: headaches, Sore throat, sneezing, nasal congestion, post nasal drip, Difficulty swallowing, Tooth/dental problems, visual complaints visual changes, ear ache CV:  chest pain, radiates:,Orthopnea, PND, swelling in lower extremities, dizziness, palpitations, syncope.  GI  heartburn, indigestion, abdominal pain, nausea, vomiting, diarrhea, change in bowel habits, loss of appetite, bloody stools.  Resp: cough, non-productive:, hemoptysis, dyspnea, chest pain, pleuritic.  Skin: rash or itching or icterus GU: dysuria, change in color of urine, urgency or frequency. flank pain, hematuria  MS: joint pain or swelling. decreased range of motion  Psych: change in mood or affect. depression or anxiety.  Neuro: difficulty with speech, weakness, numbness, ataxia    Past Medical History  He,  has a past medical history of Barrett esophagus, Dysphagia, Gastroparesis, GERD (gastroesophageal reflux disease), Hypercholesteremia, Hypertension, Iron deficiency anemia, Mental retardation, Schizophrenia (HCC), and Seizure (HCC).   Surgical History    Past Surgical History:  Procedure Laterality Date  . CRANIOTOMY FOR CYST FENESTRATION    . ESOPHAGOGASTRODUODENOSCOPY N/A 01/09/2015   Procedure: ESOPHAGOGASTRODUODENOSCOPY (EGD);  Surgeon: Louis Meckel, MD;  Location: [facility] ENDOSCOPY;  Service: Endoscopy;  Laterality: N/A;     Social  History   reports that he has never smoked. He has never used smokeless tobacco. He reports that he does not drink alcohol and does not use drugs.   Family History   His family history is not on file.   Allergies Allergies  Allergen Reactions  . Vancomycin Other (See Comments)    Unknown-possible hives      Home Medications  Prior to Admission medications   Medication Sig Start Date End Date Taking? Authorizing Provider  benazepril (LOTENSIN) 20 MG tablet Take 20 mg by mouth daily.   Yes [provider]  Cholecalciferol (VITAMIN D) 2000 units tablet Take 2,000 Units by mouth daily.   Yes [provider]  divalproex (DEPAKOTE ER) 500 MG 24 hr tablet Take 500 mg by mouth daily.   Yes [provider]  esomeprazole (NEXIUM) 40 MG capsule Take 40 mg by mouth in the morning and at bedtime.   Yes [provider]  lamoTRIgine (LAMICTAL) 100 MG tablet Take 100 mg by mouth daily.   Yes [provider]  levothyroxine (SYNTHROID, LEVOTHROID) 75 MCG tablet Take 75 mcg by mouth daily.   Yes [provider]  metoCLOPramide (REGLAN) 5 MG tablet Take 1 tablet (5 mg total) by mouth 3 (three) times daily before meals. Patient taking differently: Take 5 mg by mouth in the morning and at bedtime.  07/27/15  Yes Regalado, Belkys A, MD  propranolol (INDERAL) 10 MG tablet Take 10 mg by mouth daily. 03/31/18  Yes [provider]  QUEtiapine (SEROQUEL) 400 MG tablet Take 400 mg by mouth 2 (two) times daily.    Yes [provider]  rosuvastatin (CRESTOR) 20 MG tablet Take 20 mg by mouth daily.   Yes [provider]      Joneen Roach, AGACNP-BC [place] Pulmonary/Critical Care  See Amion for personal pager PCCM on call pager [phone]  10/22/2020 5:26 AM

## 2020-10-22 NOTE — Significant Event (Signed)
Rapid Response Event Note   Reason for Call :  Called d/t to respiratory distress/tachypnea.  Initial Focused Assessment:  Playing laying in bed with eyes closed. He will awaken easily, and is alert and oriented x 3. He goes back to sleep easily with no stimulation. His denies SOB/chest pain. His breathing is labored. He is having periods of tachypnea (RR-50s) with upper airway wheezing on expiration. He has a non-productive cough. His lungs have scattered rhonchi t/o. Abd is distended but soft. Skin cool to touch.  T-98.1, HR-100-120s, BP-158/81, RR-30-50s, SpO2-94-99% on 3L [place].   Interventions:  NRB Tx to 2M06.  Plan of Care:  PCCM to bedside. Racemic epi tx ordered however prior to giving tx, SpO2 dropped to 75% on 3L [place]. Pt placed on 15L HFNC with SpO2 increasing to 83%. Pt then placed on NRB with SpO2 increasing to 100%. Pt then emergently txed to 2M06 for intubation.    Event Summary:   MD Notified: PCCM Elink MD Ogan notified @0358 . Dr. coming to bedside to assess pt.  Call Cyndie Chime Arrival [id number] End [id number]  [id number], RN

## 2020-10-22 NOTE — Progress Notes (Signed)
PT Cancellation Note  Patient Details Name: NEPHI SAVAGE MRN: [id number] DOB: [birth year]   Cancelled Treatment:    Reason Eval/Treat Not Completed: Medical issues which prohibited therapy (Pt intubated this am and going to surgery per nurse. Sign off.)   Berline Lopes 10/22/2020, 10:35 AM Mackenna Kamer W,PT Acute Rehabilitation Services Pager:  [phone]  Office:  [phone]

## 2020-10-22 NOTE — Progress Notes (Signed)
OT Cancellation Note  Patient Details Name: DEQUANE STRAHAN MRN: [id number] DOB: 1956-09-16   Cancelled Treatment:    Reason Eval/Treat Not Completed: Patient not medically ready.  Pt intubated and going for surgery.  OT orders discontinued.  Please reorder when appropriate.  Eber Jones., OTR/L Acute Rehabilitation Services Pager [phone] Office [phone]   Jeani Hawking M 10/22/2020, 11:01 AM

## 2020-10-22 NOTE — Procedures (Signed)
Central Venous Catheter Insertion Procedure Note  ELDEN BRUCATO  [id number]  11-Sep-1956  Date:10/22/20  Time:4:09 PM   Provider Performing:Jadakiss Barish   Procedure: Insertion of Non-tunneled Central Venous [id number]) with US guidance  Indication(s) Medication administration  Consent Risks of the procedure as well as the alternatives and risks of each were explained to the patient and/or caregiver.  Consent for the procedure was obtained and is signed in the bedside chart  Anesthesia Topical only with 1% lidocaine   Timeout Verified patient identification, verified procedure, site/side was marked, verified correct patient position, special equipment/implants available, medications/allergies/relevant history reviewed, required imaging and test results available.  Sterile Technique Maximal sterile technique including full sterile barrier drape, hand hygiene, sterile gown, sterile gloves, mask, hair covering, sterile ultrasound probe cover (if used).  Procedure Description Area of catheter insertion was cleaned with chlorhexidine and draped in sterile fashion.  With real-time ultrasound guidance a central venous catheter was placed into the left subclavian vein. Nonpulsatile blood flow and easy flushing noted in all ports.  The catheter was sutured in place and sterile dressing applied.  Complications/Tolerance None; patient tolerated the procedure well. Chest X-ray is ordered to verify placement for internal jugular or subclavian cannulation.     Coralyn Helling, MD [facility] Pulmonary/Critical Care Pager - [phone] 10/22/2020, 4:09 PM

## 2020-10-22 NOTE — Progress Notes (Signed)
eLink Physician-Brief Progress Note Patient Name: [name] DOB: [birth year] MRN: [id number]   Date of Service  10/22/2020  HPI/Events of Note  RRT nurse , Mindy , requests PCCM evaluation of patient in respiratory distress on the medical ward. I do not have camera access to the patient.  eICU Interventions  PCCM ground crew requested to go and see patient.        Thomasene Lot Tache Bobst 10/22/2020, 4:12 AM

## 2020-10-22 NOTE — Progress Notes (Signed)
eLink Physician-Brief Progress Note Patient Name: [name] DOB: January 10, 1956 MRN: [id number]   Date of Service  10/22/2020  HPI/Events of Note  Patient transferred from the medical floor to the ICU  secondary to respiratory distress and increased work of breathing presumed to be due to aspiration from a large hiatal hernia compressing the trachea. He was intubated on arrival to the ICU due to acute hypoxemic respiratory failure.  eICU Interventions  New Patient Evaluation completed.        Thomasene Lot Kasy Iannacone 10/22/2020, 5:19 AM

## 2020-10-22 NOTE — Procedures (Signed)
Intubation Procedure Note  [name]  [id number]  09/04/1956  Date:10/22/20  Time:5:23 AM   Provider Performing:Demetrious Rainford T Cyndie Chime    Procedure: Intubation (31500)  Indication(s) Respiratory Failure  Consent Unable to obtain consent due to emergent nature of procedure.   Anesthesia Etomidate and Rocuronium   Time Out Verified patient identification, verified procedure, site/side was marked, verified correct patient position, special equipment/implants available, medications/allergies/relevant history reviewed, required imaging and test results available.   Sterile Technique Usual hand hygeine, masks, and gloves were used   Procedure Description Patient positioned in bed supine.  Sedation given as noted above.  Patient was intubated with endotracheal tube using Glidescope.  View was Grade 1 full glottis .  Number of attempts was 2.  Copious gastric secretions in the airway.  Colorimetric CO2 detector was consistent with tracheal placement.   Complications/Tolerance with the copious secretions, patient did get hypoxic to 50s, then recovered quickly Chest X-ray is ordered to verify placement.   EBL Minimal   Specimen(s) None

## 2020-10-23 ENCOUNTER — Encounter (HOSPITAL_COMMUNITY): Payer: Self-pay | Admitting: Internal Medicine

## 2020-10-23 ENCOUNTER — Inpatient Hospital Stay (HOSPITAL_COMMUNITY): Payer: Medicare Other

## 2020-10-23 DIAGNOSIS — J690 Pneumonitis due to inhalation of food and vomit: Secondary | ICD-10-CM | POA: Diagnosis not present

## 2020-10-23 DIAGNOSIS — R6521 Severe sepsis with septic shock: Secondary | ICD-10-CM | POA: Diagnosis not present

## 2020-10-23 DIAGNOSIS — J9601 Acute respiratory failure with hypoxia: Secondary | ICD-10-CM | POA: Diagnosis not present

## 2020-10-23 DIAGNOSIS — A419 Sepsis, unspecified organism: Secondary | ICD-10-CM | POA: Diagnosis not present

## 2020-10-23 LAB — GLUCOSE, CAPILLARY
Glucose-Capillary: 105 mg/dL — ABNORMAL HIGH (ref 70–99)
Glucose-Capillary: 103 mg/dL — ABNORMAL HIGH (ref 70–99)
Glucose-Capillary: 103 mg/dL — ABNORMAL HIGH (ref 70–99)
Glucose-Capillary: 127 mg/dL — ABNORMAL HIGH (ref 70–99)
Glucose-Capillary: 133 mg/dL — ABNORMAL HIGH (ref 70–99)

## 2020-10-23 LAB — BASIC METABOLIC PANEL
Anion gap: 10 (ref 5–15)
BUN: 13 mg/dL (ref 8–23)
CO2: 26 mmol/L (ref 22–32)
Calcium: 7.9 mg/dL — ABNORMAL LOW (ref 8.9–10.3)
Chloride: 108 mmol/L (ref 98–111)
Creatinine, Ser: 1.23 mg/dL (ref 0.61–1.24)
GFR, Estimated: >60 mL/min (ref >60)
Glucose, Bld: 130 mg/dL — ABNORMAL HIGH (ref 70–99)
Potassium: 4.2 mmol/L (ref 3.5–5.1)
Sodium: 144 mmol/L (ref 135–145)

## 2020-10-23 LAB — CBC
HCT: 29.4 % — ABNORMAL LOW (ref 39.0–52.0)
Hemoglobin: 8.7 g/dL — ABNORMAL LOW (ref 13.0–17.0)
MCH: 25.7 pg — ABNORMAL LOW (ref 26.0–34.0)
MCHC: 29.6 g/dL — ABNORMAL LOW (ref 30.0–36.0)
MCV: 86.7 fL (ref 80.0–100.0)
Platelets: 221 10*3/uL (ref 150–400)
RBC: 3.39 MIL/uL — ABNORMAL LOW (ref 4.22–5.81)
RDW: 16.3 % — ABNORMAL HIGH (ref 11.5–15.5)
WBC: 18 10*3/uL — ABNORMAL HIGH (ref 4.0–10.5)
nRBC: 0.2 % (ref 0.0–0.2)

## 2020-10-23 MED ORDER — HYDROCORTISONE NA SUCCINATE PF 100 MG IJ SOLR
50.0000 mg | Freq: Four times a day (QID) | INTRAMUSCULAR | Status: DC
  Administered 2020-10-23 – 2020-10-24 (×5): 50 mg via INTRAVENOUS
  Filled 2020-10-23 (×5): qty 2

## 2020-10-23 MED ORDER — SODIUM CHLORIDE 0.9% FLUSH
10.0000 mL | Freq: Two times a day (BID) | INTRAVENOUS | Status: DC
  Administered 2020-10-23 – 2020-10-25 (×4): 10 mL
  Administered 2020-10-25: 20 mL
  Administered 2020-10-26 – 2020-11-08 (×28): 10 mL

## 2020-10-23 MED ORDER — LIP MEDEX EX OINT
TOPICAL_OINTMENT | CUTANEOUS | Status: DC | PRN
  Filled 2020-10-23: qty 7

## 2020-10-23 MED ORDER — SODIUM CHLORIDE 0.9% FLUSH: 10.0000 mL | INTRAVENOUS | Status: DC | PRN

## 2020-10-23 NOTE — Progress Notes (Signed)
Spoke with pt's sister, [name] [facility]).  Updated about current status and treatment plan.  She has requested to have thoracic surgery contact her after they reassess Mr Flanagin regarding whether he would be a surgical candidate at some point, and if so then when would surgery be considered.  Coralyn Helling, MD [facility] Pulmonary/Critical Care Pager - [phone] 10/23/2020, 5:10 PM

## 2020-10-23 NOTE — Progress Notes (Signed)
Assisted tele visit to patient with family member.  London Tarnowski Ann, RN  

## 2020-10-23 NOTE — Progress Notes (Signed)
NAME:  [name], MRN:  [id number], DOB:  1956-02-17, LOS: 2 ADMISSION DATE:  10/20/2020, CONSULTATION DATE:  10/29 REFERRING MD:  Dr. Loney Loh, CHIEF COMPLAINT:  Dyspnea   Brief History   64 year old male presented to ED after syncopal episode and was found to have large hiatal hernia compressing trachea.   Past Medical History  GERD, Gastroparesis, Barrett's esophagus, HLD, HTN, Iron deficiency anemia, Seizures, Schizophrenia, Developmental delay  Significant Hospital Events   10/29 admit, EGD done 10/30 transferred to ICU for respiratory distress, intubated.   Consults:  CVTS GI  Procedures:  ETT 10/30 >> Lt [place] CVL 10/30 >>   Significant Diagnostic Tests:  CTA chest 10/29 > no PE, RUL ATX CT abd/pelvis 10/29  > large, stable gastric hernia with associated esophageal dilation and subsequent mass effect on the trachea.    Micro Data:  COVID 10/28 >> negative MRSA PCR 10/28 >> negative Blood 10/28 >>  Sputum 10/30 >>   Antimicrobials:  Unasyn 10/28 >>   Interim history/subjective:  Remains on sedation, pressors, vent support with increased PEEP and FiO2.  Objective   Blood pressure (!) 136/59, pulse 67, temperature 99.1 F (37.3 C), resp. rate 20, height (S) 5\' 4"  (1.626 m), weight 77 kg, SpO2 99 %. CVP:  [5 mmHg] 5 mmHg  Vent Mode: PRVC FiO2 (%):  [40 %-100 %] 40 % Set Rate:  [20 bmp] 20 bmp Vt Set:  [410 mL] 410 mL PEEP:  [8 cmH20] 8 cmH20 Plateau Pressure:  [18 cmH20-20 cmH20] 20 cmH20   Intake/Output Summary (Last 24 hours) at 10/23/2020 0816 Last data filed at 10/23/2020 0600 Gross per 24 hour  Intake 3413.55 ml  Output 2025 ml  Net 1388.55 ml   Filed Weights   10/20/20 2112  Weight: 77 kg    Examination:  General - sedated Eyes - pupils reactive ENT - ETT in place Cardiac - regular rate/rhythm, no murmur Chest - faint crackles Rt > Lt Abdomen - soft, non tender, + bowel sounds Extremities - no cyanosis, clubbing, or edema Skin -  no rashes Neuro - RASS -2  Resolved Hospital Problem list     Assessment & Plan:   Acute hypoxic/hypercapnic respiratory failure 2nd to aspiration pneumonia. - full vent support - f/u CXR - goal SpO2 90 to 95% - day 4 of ABx  Septic shock from aspiration pneumonia. - pressors for MAP goal > 65 - continue LR at 75 ml/hr  Relative adrenal insufficiency. - cortisol 10.7 from 10/22/20 - solu cortef 50 mg q6h while on pressors  Large hiatal hernia. GERD, Barrett's esophagus, gastroparesis. - GI consulted - seen by Dr. 10/24/20 from thoracic surgery - will need to arrange for cortrak on 11/01 to start tube feeds  Anemia of critical illness. - f/u CBC - transfuse for Hb < 7 or significant bleeding  Acute metabolic encephalopathy 2nd to sepsis, hypoxia, hypercapnia.  Seizure disorder, Schizophrenia. - RASS goal 0 to -1 - valproate IV  Hypothyroidism. - Synthroid IV  Best practice:  Diet: NPO DVT prophylaxis: lovenox GI prophylaxis: protonix Mobility: bed rest Code Status: full Disposition: ICU  Labs    CMP Latest Ref Rng & Units 10/23/2020 10/22/2020 10/22/2020  Glucose 70 - 99 mg/dL 10/24/2020) - 782(N)  BUN 8 - 23 mg/dL 13 - 18  Creatinine 562(Z - 1.24 mg/dL 3.08 - 6.57  Sodium 8.46 - 145 mmol/L 144 141 139  Potassium 3.5 - 5.1 mmol/L 4.2 4.0 4.3  Chloride 98 -  111 mmol/L 108 - 103  CO2 22 - 32 mmol/L 26 - 25  Calcium 8.9 - 10.3 mg/dL 7.9(L) - 8.2(L)  Total Protein 6.5 - 8.1 g/dL - - -  Total Bilirubin 0.3 - 1.2 mg/dL - - -  Alkaline Phos 38 - 126 U/L - - -  AST 15 - 41 U/L - - -  ALT 0 - 44 U/L - - -    CBC Latest Ref Rng & Units 10/23/2020 10/22/2020 10/22/2020  WBC 4.0 - 10.5 K/uL 18.0(H) - 7.2  Hemoglobin 13.0 - 17.0 g/dL 8.8(C) 1.6(S) 0.6(T)  Hematocrit 39 - 52 % 29.4(L) 27.0(L) 28.6(L)  Platelets 150 - 400 K/uL 221 - 237    CBG (last 3)  Recent Labs    10/22/20 2335 10/23/20 0356 10/23/20 0745  GLUCAP 147* 105* 103*    ABG    Component  Value Date/Time   PHART 7.333 (L) 10/22/2020 0620   PCO2ART 53.0 (H) 10/22/2020 0620   PO2ART 79 (L) 10/22/2020 0620   HCO3 27.8 10/22/2020 0620   TCO2 29 10/22/2020 0620   ACIDBASEDEF 1.0 10/05/2007 1720   O2SAT 94.0 10/22/2020 0620    Critical care time: 32 minutes  Coralyn Helling, MD [place] Pulmonary/Critical Care Pager - [phone] 10/23/2020, 8:16 AM

## 2020-10-24 ENCOUNTER — Inpatient Hospital Stay (HOSPITAL_COMMUNITY): Payer: Medicare Other

## 2020-10-24 DIAGNOSIS — J9601 Acute respiratory failure with hypoxia: Secondary | ICD-10-CM | POA: Diagnosis not present

## 2020-10-24 DIAGNOSIS — K458 Other specified abdominal hernia without obstruction or gangrene: Secondary | ICD-10-CM

## 2020-10-24 DIAGNOSIS — K449 Diaphragmatic hernia without obstruction or gangrene: Secondary | ICD-10-CM | POA: Diagnosis not present

## 2020-10-24 LAB — CBC
HCT: 25.6 % — ABNORMAL LOW (ref 39.0–52.0)
Hemoglobin: 7.8 g/dL — ABNORMAL LOW (ref 13.0–17.0)
MCH: 25.7 pg — ABNORMAL LOW (ref 26.0–34.0)
MCHC: 30.5 g/dL (ref 30.0–36.0)
MCV: 84.5 fL (ref 80.0–100.0)
Platelets: 207 10*3/uL (ref 150–400)
RBC: 3.03 MIL/uL — ABNORMAL LOW (ref 4.22–5.81)
RDW: 16 % — ABNORMAL HIGH (ref 11.5–15.5)
WBC: 18.5 10*3/uL — ABNORMAL HIGH (ref 4.0–10.5)
nRBC: 0.2 % (ref 0.0–0.2)

## 2020-10-24 LAB — BASIC METABOLIC PANEL
Anion gap: 10 (ref 5–15)
BUN: 12 mg/dL (ref 8–23)
CO2: 28 mmol/L (ref 22–32)
Calcium: 8 mg/dL — ABNORMAL LOW (ref 8.9–10.3)
Chloride: 103 mmol/L (ref 98–111)
Creatinine, Ser: 1.1 mg/dL (ref 0.61–1.24)
GFR, Estimated: >60 mL/min (ref >60)
Glucose, Bld: 146 mg/dL — ABNORMAL HIGH (ref 70–99)
Potassium: 3.7 mmol/L (ref 3.5–5.1)
Sodium: 141 mmol/L (ref 135–145)

## 2020-10-24 LAB — CULTURE, RESPIRATORY W GRAM STAIN: Culture: NORMAL

## 2020-10-24 LAB — MAGNESIUM: Magnesium: 2.3 mg/dL (ref 1.7–2.4)

## 2020-10-24 LAB — GLUCOSE, CAPILLARY
Glucose-Capillary: 107 mg/dL — ABNORMAL HIGH (ref 70–99)
Glucose-Capillary: 121 mg/dL — ABNORMAL HIGH (ref 70–99)
Glucose-Capillary: 131 mg/dL — ABNORMAL HIGH (ref 70–99)
Glucose-Capillary: 140 mg/dL — ABNORMAL HIGH (ref 70–99)
Glucose-Capillary: 143 mg/dL — ABNORMAL HIGH (ref 70–99)
Glucose-Capillary: 151 mg/dL — ABNORMAL HIGH (ref 70–99)
Glucose-Capillary: 154 mg/dL — ABNORMAL HIGH (ref 70–99)

## 2020-10-24 LAB — PHOSPHORUS: Phosphorus: 3.4 mg/dL (ref 2.5–4.6)

## 2020-10-24 MED ORDER — ONDANSETRON HCL 4 MG/2ML IJ SOLN
4.0000 mg | Freq: Three times a day (TID) | INTRAMUSCULAR | Status: DC | PRN
  Administered 2020-10-24: 4 mg via INTRAVENOUS
  Filled 2020-10-24: qty 2

## 2020-10-24 MED ORDER — VITAL AF 1.2 CAL PO LIQD
1000.0000 mL | ORAL | Status: DC
  Administered 2020-10-24: 1000 mL
  Filled 2020-10-24: qty 1000

## 2020-10-24 MED ORDER — PROSOURCE TF PO LIQD
45.0000 mL | Freq: Three times a day (TID) | ORAL | Status: DC
  Administered 2020-10-24: 45 mL

## 2020-10-24 MED ORDER — LAMOTRIGINE 100 MG PO TABS
100.0000 mg | ORAL_TABLET | Freq: Every day | ORAL | Status: DC
  Administered 2020-10-24 – 2020-11-15 (×22): 100 mg
  Filled 2020-10-24 (×24): qty 1

## 2020-10-24 MED ORDER — HEPARIN SODIUM (PORCINE) 5000 UNIT/ML IJ SOLN
5000.0000 [IU] | Freq: Three times a day (TID) | INTRAMUSCULAR | Status: DC
  Administered 2020-10-24 – 2020-10-26 (×7): 5000 [IU] via SUBCUTANEOUS
  Filled 2020-10-24 (×7): qty 1

## 2020-10-24 MED ORDER — VALPROIC ACID 250 MG/5ML PO SOLN
250.0000 mg | Freq: Two times a day (BID) | ORAL | Status: DC
  Administered 2020-10-24 – 2020-10-26 (×5): 250 mg
  Filled 2020-10-24 (×6): qty 5

## 2020-10-24 MED ORDER — PROSOURCE TF PO LIQD
45.0000 mL | Freq: Two times a day (BID) | ORAL | Status: DC
  Administered 2020-10-24 – 2020-10-25 (×3): 45 mL
  Filled 2020-10-24 (×4): qty 45

## 2020-10-24 MED ORDER — LEVOTHYROXINE SODIUM 25 MCG PO TABS
75.0000 ug | ORAL_TABLET | Freq: Every day | ORAL | Status: DC
  Administered 2020-10-24 – 2020-10-26 (×3): 75 ug
  Filled 2020-10-24 (×3): qty 3

## 2020-10-24 MED ORDER — PROSOURCE TF PO LIQD
45.0000 mL | Freq: Two times a day (BID) | ORAL | Status: DC
  Filled 2020-10-24: qty 45

## 2020-10-24 MED ORDER — PANTOPRAZOLE SODIUM 40 MG PO PACK
40.0000 mg | PACK | Freq: Two times a day (BID) | ORAL | Status: DC
  Administered 2020-10-24 – 2020-11-15 (×43): 40 mg
  Filled 2020-10-24 (×46): qty 20

## 2020-10-24 MED ORDER — DEXMEDETOMIDINE HCL IN NACL 400 MCG/100ML IV SOLN
0.4000 ug/kg/h | INTRAVENOUS | Status: DC
  Administered 2020-10-24: 0.7 ug/kg/h via INTRAVENOUS
  Administered 2020-10-24: 0.5 ug/kg/h via INTRAVENOUS
  Administered 2020-10-24: 0.8 ug/kg/h via INTRAVENOUS
  Administered 2020-10-25: 0.4 ug/kg/h via INTRAVENOUS
  Administered 2020-10-25: 1 ug/kg/h via INTRAVENOUS
  Filled 2020-10-24 (×5): qty 100

## 2020-10-24 MED ORDER — VITAL HIGH PROTEIN PO LIQD: 1000.0000 mL | ORAL | Status: DC

## 2020-10-24 MED ORDER — QUETIAPINE FUMARATE 100 MG PO TABS
400.0000 mg | ORAL_TABLET | Freq: Two times a day (BID) | ORAL | Status: DC
  Administered 2020-10-24 – 2020-11-15 (×43): 400 mg
  Filled 2020-10-24: qty 8
  Filled 2020-10-24 (×4): qty 4
  Filled 2020-10-24 (×2): qty 8
  Filled 2020-10-24 (×7): qty 4
  Filled 2020-10-24: qty 8
  Filled 2020-10-24: qty 4
  Filled 2020-10-24: qty 8
  Filled 2020-10-24 (×3): qty 4
  Filled 2020-10-24: qty 8
  Filled 2020-10-24 (×14): qty 4
  Filled 2020-10-24: qty 8
  Filled 2020-10-24 (×3): qty 4
  Filled 2020-10-24: qty 8
  Filled 2020-10-24 (×2): qty 4
  Filled 2020-10-24 (×2): qty 8

## 2020-10-24 MED ORDER — ROSUVASTATIN CALCIUM 20 MG PO TABS
20.0000 mg | ORAL_TABLET | Freq: Every day | ORAL | Status: DC
  Administered 2020-10-24 – 2020-11-15 (×22): 20 mg
  Filled 2020-10-24 (×22): qty 1

## 2020-10-24 NOTE — Progress Notes (Signed)
eLink Physician-Brief Progress Note Patient Name: [name] DOB: [birth year] MRN: [id number]   Date of Service  10/24/2020  HPI/Events of Note  RN requesting anti-emetic for this patient who had emesis while receiving tube feeds. TF are now on hold.   eICU Interventions  Zofran 4mg  IV Q8H PRN n/v ordered.     Intervention Category Minor Interventions: Routine modifications to care plan (e.g. PRN medications for pain, fever)  Sherlynn Tourville 10/24/2020, 10:34 PM

## 2020-10-24 NOTE — Progress Notes (Signed)
Initial Nutrition Assessment  RD working remotely.  DOCUMENTATION CODES:   Not applicable  INTERVENTION:   Initiate tube feeds via Cortrak: - Start Vital AF 1.2 @ 20 ml/hr and titrate by 10 ml/hr q 4 hours to goal rate of 50 ml/hr (1200 ml/day) - ProSource TF 45 ml BID  Tube feeding regimen at goal provides 1520 kcal, 112 grams of protein, and 973 ml of H2O.   Tube feeding regimen and current propofol provides 1810 total kcal (100% of needs).  NUTRITION DIAGNOSIS:   Inadequate oral intake related to acute illness as evidenced by NPO status.  GOAL:   Patient will meet greater than or equal to 90% of their needs  MONITOR:   Vent status, Labs, Weight trends, TF tolerance  REASON FOR ASSESSMENT:   Ventilator, Consult Enteral/tube feeding initiation and management  ASSESSMENT:   64 year old male who presented to the ED on 10/28 from his group home after a syncopal episode. PMH of stroke with left-sided deficits and dysphagia, Barrett's esophagus, GERD, gastroparesis, HTN, HLD, iron deficiency anemia, intellectual disability, schizophrenia, seizure disorder, hypothyroidism. Pt was found to have a large hiatal hernia compressing his trachea along with gastric and esophageal distention.  10/29 - EGD showing 10 cm hiatal hernia, 2 small gastric polyps (biopsied), irregular Z-line with hiatal hernia deformity vs short segment Barrett's esophagus (biopsied) 10/30 - transferred to ICU for respiratory distress, intubated 11/01 - Cortrak placed, tip gastric per abdominal x-ray  Received consult for tube feeding intiation and management. Cortrak placed this AM. Per abdominal x-ray reading, "tip of feeding tube projects over distal gastric antrum/pylorus."  Levophed off this AM.  Unable to obtain diet and weight history at this time. Reviewed weights in chart. Weight of 77 kg recorded on 10/20/20 is consistent with weight from 09/07/20.  Patient is currently intubated on ventilator  support MV: 8.3 L/min Temp (24hrs), Avg:98.5 F (36.9 C), Min:97.2 F (36.2 C), Max:100.4 F (38 C) BP (cuff): 122/78 MAP (cuff): 92  Drips: Propofol: 11 ml/hr (provides 290 kcal daily from lipid) Fentanyl  Medications reviewed and include: IV protonix, depacon  Labs reviewed: hemoglobin 7.8 CBG's: 103-140 x 24 hours  UOP: 1773 ml x 24 hours I/O's: +5.6 L since admit  NUTRITION - FOCUSED PHYSICAL EXAM:  Unable to complete at this time. RD working remotely.  Diet Order:   Diet Order            Diet NPO time specified  Diet effective now                 EDUCATION NEEDS:   No education needs have been identified at this time  Skin:  Skin Assessment: Reviewed RN Assessment  Last BM:  10/23/20  Height:   Ht Readings from Last 1 Encounters:  10/22/20 (S) 5\' 4"  (1.626 m)    Weight:   Wt Readings from Last 1 Encounters:  10/20/20 77 kg    Ideal Body Weight:  59.1 kg  BMI:  Body mass index is 29.14 kg/m.  Estimated Nutritional Needs:   Kcal:  1817  Protein:  100-125 grams  Fluid:  >/= 1.6 L    10/22/20, MS, RD, LDN Inpatient Clinical Dietitian Please see AMiON for contact information.

## 2020-10-24 NOTE — Procedures (Signed)
Cortrak  Person Inserting Tube:  [name], RD Tube Type:  Cortrak - 43 inches Tube Location:  Right nare Initial Placement:  Stomach Secured by: Bridle Technique Used to Measure Tube Placement:  Documented cm marking at nare/ corner of mouth Cortrak Secured At:  65 cm    Cortrak Tube Team Note:  Consult received to place a Cortrak feeding tube.   X-ray is required, abdominal x-ray has been ordered by the Cortrak team. Please confirm tube placement before using the Cortrak tube.   If the tube becomes dislodged please keep the tube and contact the Cortrak team at [email or web address] ([email or web address]) for replacement.  If after hours and replacement cannot be delayed, place a NG tube and confirm placement with an abdominal x-ray.   Gerald Pacini, MS, RD, LDN Pager number available on Amion

## 2020-10-24 NOTE — Progress Notes (Signed)
NAME:  [name], MRN:  [id number], DOB:  November 30, 1956, LOS: 3 ADMISSION DATE:  10/20/2020, CONSULTATION DATE:  10/29 REFERRING MD:  Dr. Loney Loh, CHIEF COMPLAINT:  Dyspnea   Brief History   64 year old male presented to ED after syncopal episode and was found to have large hiatal hernia compressing trachea.   Past Medical History  GERD, Gastroparesis, Barrett's esophagus, HLD, HTN, Iron deficiency anemia, Seizures, Schizophrenia, Developmental delay  Significant Hospital Events   10/29 admit, EGD done 10/30 transferred to ICU for respiratory distress, intubated.   Consults:  CVTS GI  Procedures:  ETT 10/30 >> Lt [place] CVL 10/30 >>   Significant Diagnostic Tests:  CTA chest 10/29 > no PE, RUL ATX CT abd/pelvis 10/29  > large, stable gastric hernia with associated esophageal dilation and subsequent mass effect on the trachea.    Micro Data:  COVID 10/28 >> negative MRSA PCR 10/28 >> negative Blood 10/28 >>  Sputum 10/30 >>   Antimicrobials:  Unasyn 10/28 >>   Interim history/subjective:  On minimal sedation, following commands, minimal vent settings.  Objective   Blood pressure 99/65, pulse (!) 59, temperature 98.1 F (36.7 C), resp. rate 20, height (S) 5\' 4"  (1.626 m), weight 77 kg, SpO2 99 %. CVP:  [12 mmHg] 12 mmHg  Vent Mode: PRVC FiO2 (%):  [40 %] 40 % Set Rate:  [20 bmp] 20 bmp Vt Set:  [410 mL] 410 mL PEEP:  [5 cmH20] 5 cmH20 Plateau Pressure:  [17 cmH20-18 cmH20] 17 cmH20   Intake/Output Summary (Last 24 hours) at 10/24/2020 1024 Last data filed at 10/24/2020 1022 Gross per 24 hour  Intake 3170.23 ml  Output 1473 ml  Net 1697.23 ml   Filed Weights   10/20/20 2112  Weight: 77 kg    Examination:  General -resting comfortably, no distress Eyes - pupils reactive ENT - ETT in place Cardiac - regular rate/rhythm, no murmur Chest -clear to anterior auscultation Abdomen - soft, non tender, + bowel sounds Skin - no rashes Neuro - RASS 0,  follows commands  Resolved Hospital Problem list     Assessment & Plan:   Acute hypoxic/hypercapnic respiratory failure 2nd to aspiration pneumonia. - Has received 4 days of antibiotics, given no focal infiltrate on chest x-ray, no leukocytosis on admission, suspect aspiration event without clinical pneumonia  Septic shock from aspiration pneumonia. - Off pressors.  Sepsis ruled out -Suspect this is circulatory shock  Large hiatal hernia. GERD, Barrett's esophagus, gastroparesis. - GI consulted - seen by Dr. 2113 from thoracic surgery, EGD completed with esophageal mass ruled out -Will discuss with thoracic surgery if any plans for OR.   Relative adrenal insufficiency. -Hemodynamics improved, will discontinue stress dose steroids  Anemia of critical illness. - f/u CBC - transfuse for Hb < 7 or significant bleeding  Acute metabolic encephalopathy 2nd to sepsis, hypoxia, hypercapnia.  Seizure disorder, Schizophrenia. - RASS goal 0 to -1 - valproate IV, will resume home medications now that we have enteral access  Hypothyroidism. - Synthroid IV  Best practice:  Diet: We will advance tube feeds now that core track is in DVT prophylaxis: lovenox GI prophylaxis: protonix Mobility: bed rest Code Status: full Disposition: ICU  The patient is critically ill with multiple organ systems failure and requires high complexity decision making for assessment and support, frequent evaluation and titration of therapies, application of advanced monitoring technologies and extensive interpretation of multiple databases.   Critical Care Time devoted to patient care services described  in this note is 32 minutes. This time reflects time of care of this signee Charlott Holler . This critical care time does not reflect separately billable procedures or procedure time, teaching time or supervisory time of PA/NP/Med student/Med Resident etc but could involve care discussion time.  Mickel Baas Pulmonary and Critical Care Medicine 10/24/2020 10:24 AM  Pager: [phone] After hours pager: [phone]   Labs    CMP Latest Ref Rng & Units 10/24/2020 10/23/2020 10/22/2020  Glucose 70 - 99 mg/dL 374(M) 270(B) -  BUN 8 - 23 mg/dL 12 13 -  Creatinine 8.67 - 1.24 mg/dL 5.44 9.20 -  Sodium 100 - 145 mmol/L 141 144 141  Potassium 3.5 - 5.1 mmol/L 3.7 4.2 4.0  Chloride 98 - 111 mmol/L 103 108 -  CO2 22 - 32 mmol/L 28 26 -  Calcium 8.9 - 10.3 mg/dL 8.0(L) 7.9(L) -  Total Protein 6.5 - 8.1 g/dL - - -  Total Bilirubin 0.3 - 1.2 mg/dL - - -  Alkaline Phos 38 - 126 U/L - - -  AST 15 - 41 U/L - - -  ALT 0 - 44 U/L - - -    CBC Latest Ref Rng & Units 10/24/2020 10/23/2020 10/22/2020  WBC 4.0 - 10.5 K/uL 18.5(H) 18.0(H) -  Hemoglobin 13.0 - 17.0 g/dL 7.8(L) 8.7(L) 9.2(L)  Hematocrit 39 - 52 % 25.6(L) 29.4(L) 27.0(L)  Platelets 150 - 400 K/uL 207 221 -    CBG (last 3)  Recent Labs    10/23/20 2359 10/24/20 0353 10/24/20 0751  GLUCAP 140* 121* 107*    ABG    Component Value Date/Time   PHART 7.333 (L) 10/22/2020 0620   PCO2ART 53.0 (H) 10/22/2020 0620   PO2ART 79 (L) 10/22/2020 0620   HCO3 27.8 10/22/2020 0620   TCO2 29 10/22/2020 0620   ACIDBASEDEF 1.0 10/05/2007 1720   O2SAT 94.0 10/22/2020 7121

## 2020-10-24 NOTE — Progress Notes (Signed)
     [address]       [place] [address]             [phone]       I had a long discussion with [name] sister today.  She describes of a a 20-year history of gastrointestinal issues.  He he has been followed for his Barrett's esophagus and hiatal hernia by primary gastroenterologist.  She states that over the last 8 years he has had at least 5 admissions for aspiration pneumonia.  He currently is only able to tolerate small volume meals, and also has had multiple ED visits for dehydration.  His cognitive dysfunction makes his care quite challenging but she does think that he would benefit from a hiatal hernia repair.  On review of his cross-sectional imaging, I expressed that my concern is the fact that his esophagus is so dilated.  Additionally that even if we were to perform the hiatal hernia repair given his dysmotility, he likely would still require significant speech therapy to achieve normal swallowing.    I have recommended that he undergo a robotic assisted hiatal hernia repair, along with the PEG tube placement.  I will not plan on performing a fundoplication given the strong likelihood of esophageal dysmotility.  Given that he has had multiple bouts of aspiration pneumonia, I would like him to be extubated prior to proceeding with this surgery.  He does have evidence of right-sided infiltrates.  The other thing to consider is the mass-effect that his stomach is planning on his heart and lungs in regards to his ventilatory status.  I will discuss this in further detail with Dr. Celine Mans critical care team.  He is tentatively scheduled for October 26, 2020 pending improvement in his lung function.  [name] [name]

## 2020-10-25 ENCOUNTER — Encounter: Payer: Self-pay | Admitting: Internal Medicine

## 2020-10-25 ENCOUNTER — Inpatient Hospital Stay (HOSPITAL_COMMUNITY): Payer: Medicare Other

## 2020-10-25 ENCOUNTER — Ambulatory Visit: Payer: Self-pay | Admitting: *Deleted

## 2020-10-25 ENCOUNTER — Ambulatory Visit: Payer: Medicare Other | Admitting: Neurology

## 2020-10-25 DIAGNOSIS — K317 Polyp of stomach and duodenum: Secondary | ICD-10-CM

## 2020-10-25 DIAGNOSIS — G9341 Metabolic encephalopathy: Secondary | ICD-10-CM | POA: Diagnosis not present

## 2020-10-25 DIAGNOSIS — K449 Diaphragmatic hernia without obstruction or gangrene: Secondary | ICD-10-CM | POA: Diagnosis not present

## 2020-10-25 DIAGNOSIS — J9601 Acute respiratory failure with hypoxia: Secondary | ICD-10-CM | POA: Diagnosis not present

## 2020-10-25 LAB — SURGICAL PATHOLOGY

## 2020-10-25 LAB — BASIC METABOLIC PANEL
Anion gap: 10 (ref 5–15)
BUN: 28 mg/dL — ABNORMAL HIGH (ref 8–23)
CO2: 27 mmol/L (ref 22–32)
Calcium: 8 mg/dL — ABNORMAL LOW (ref 8.9–10.3)
Chloride: 107 mmol/L (ref 98–111)
Creatinine, Ser: 1.26 mg/dL — ABNORMAL HIGH (ref 0.61–1.24)
GFR, Estimated: >60 mL/min (ref >60)
Glucose, Bld: 131 mg/dL — ABNORMAL HIGH (ref 70–99)
Potassium: 3.4 mmol/L — ABNORMAL LOW (ref 3.5–5.1)
Sodium: 144 mmol/L (ref 135–145)

## 2020-10-25 LAB — CBC
HCT: 26.8 % — ABNORMAL LOW (ref 39.0–52.0)
Hemoglobin: 8.1 g/dL — ABNORMAL LOW (ref 13.0–17.0)
MCH: 25.8 pg — ABNORMAL LOW (ref 26.0–34.0)
MCHC: 30.2 g/dL (ref 30.0–36.0)
MCV: 85.4 fL (ref 80.0–100.0)
Platelets: 280 10*3/uL (ref 150–400)
RBC: 3.14 MIL/uL — ABNORMAL LOW (ref 4.22–5.81)
RDW: 16.4 % — ABNORMAL HIGH (ref 11.5–15.5)
WBC: 17.7 10*3/uL — ABNORMAL HIGH (ref 4.0–10.5)
nRBC: 0.8 % — ABNORMAL HIGH (ref 0.0–0.2)

## 2020-10-25 LAB — MAGNESIUM
Magnesium: 2.7 mg/dL — ABNORMAL HIGH (ref 1.7–2.4)
Magnesium: 2.4 mg/dL (ref 1.7–2.4)

## 2020-10-25 LAB — GLUCOSE, CAPILLARY
Glucose-Capillary: 124 mg/dL — ABNORMAL HIGH (ref 70–99)
Glucose-Capillary: 121 mg/dL — ABNORMAL HIGH (ref 70–99)
Glucose-Capillary: 90 mg/dL (ref 70–99)
Glucose-Capillary: 105 mg/dL — ABNORMAL HIGH (ref 70–99)
Glucose-Capillary: 83 mg/dL (ref 70–99)
Glucose-Capillary: 91 mg/dL (ref 70–99)

## 2020-10-25 LAB — CULTURE, BLOOD (ROUTINE X 2)
Culture: NO GROWTH
Culture: NO GROWTH
Special Requests: ADEQUATE

## 2020-10-25 LAB — PHOSPHORUS
Phosphorus: 3.6 mg/dL (ref 2.5–4.6)
Phosphorus: 2.6 mg/dL (ref 2.5–4.6)

## 2020-10-25 MED ORDER — POTASSIUM CHLORIDE 20 MEQ PO PACK
40.0000 meq | PACK | Freq: Once | ORAL | Status: AC
  Administered 2020-10-25: 40 meq
  Filled 2020-10-25: qty 2

## 2020-10-25 NOTE — Progress Notes (Signed)
     [address]       [place] [address]             [phone]       No events.  Vitals:   10/25/20 1130 10/25/20 1200  BP: 90/61 90/74  Pulse: (!) 59 74  Resp: 20 14  Temp:    SpO2: 97% 93%   Sedated Vent Mode: PRVC FiO2 (%):  [40 %] 40 % Set Rate:  [20 bmp] 20 bmp Vt Set:  [410 mL] 410 mL PEEP:  [5 cmH20] 5 cmH20 Plateau Pressure:  [15 cmH20-20 cmH20] 62 cmH103  64 year old male with large paraesophageal hernia, esophageal dilation History of aspiration pneumonia currently requiring mechanical ventilation.  I spoke with the ICU team and I agree with their concerns about recurrent aspiration if we were to try to extubate this patient.  Additionally his lung compliance is likely related to the mass-effect from his paraesophageal hernia and an esophageal dilation.  I discussed this in great detail with the sister and she is agreeable to proceed with surgery tomorrow.  We will plan for robotic assisted paraesophageal hernia likely with mesh, and a percutaneous gastrostomy tube placement.  [name]

## 2020-10-25 NOTE — Progress Notes (Signed)
Assisted tele visit to patient with family member.  Devina Bezold McEachran, RN  

## 2020-10-25 NOTE — Progress Notes (Signed)
Patient sinus brady and SBP 88 .Dr Celine Mans informed . Will monitor

## 2020-10-25 NOTE — Progress Notes (Signed)
eLink Physician-Brief Progress Note Patient Name: RAWLIN REAUME DOB: 1956/02/02 MRN: [id number]   Date of Service  10/25/2020  HPI/Events of Note  Agitation - Ventilator asynchrony. Precedex IV infusion stopped earlier today.   eICU Interventions  Plan: 1. Restart already ordered Fentanyl IV infusion.      Intervention Category Major Interventions: Delirium, psychosis, severe agitation - evaluation and management  Latondra Gebhart [place] 10/25/2020, 9:57 PM

## 2020-10-25 NOTE — Progress Notes (Signed)
Hyperplastic gastric polyp benign Short segment Barrett's esophagus already known no dysplasia Patient is in the hospital right now and is going to have hiatal hernia repair tomorrow  I will send a letter regarding need for follow-up upper endoscopy, he is established with [facility] and has been following there

## 2020-10-25 NOTE — Progress Notes (Signed)
NAME:  [name], MRN:  [id number], DOB:  [birth year], LOS: 4 ADMISSION DATE:  10/20/2020, CONSULTATION DATE:  10/29 REFERRING MD:  Dr. Loney Loh, CHIEF COMPLAINT:  Dyspnea   Brief History   64 year old male presented to ED after syncopal episode and was found to have large hiatal hernia compressing trachea.   Past Medical History  GERD, Gastroparesis, Barrett's esophagus, HLD, HTN, Iron deficiency anemia, Seizures, Schizophrenia, Developmental delay  Significant Hospital Events   10/29 admit, EGD done 10/30 transferred to ICU for respiratory distress, intubated.   Consults:  CVTS GI  Procedures:  ETT 10/30 >> Lt [place] CVL 10/30 >>   Significant Diagnostic Tests:  CTA chest 10/29 > no PE, RUL ATX CT abd/pelvis 10/29  > large, stable gastric hernia with associated esophageal dilation and subsequent mass effect on the trachea.    Micro Data:  COVID 10/28 >> negative MRSA PCR 10/28 >> negative Blood 10/28 >>  Sputum 10/30 >>   Antimicrobials:  Unasyn 10/28 >> 11/1  Interim history/subjective:  Examined this morning off sedation. He is following commands although profoundly weak. Mostly only moves his right side. Purposefully tried to remove endotracheal tube and required restraints and IV sedation. Was on pressure support trial.  Objective   Blood pressure 128/77, pulse (!) 58, temperature 98.1 F (36.7 C), temperature source Axillary, resp. rate 20, height (S) 5\' 4"  (1.626 m), weight 77.3 kg, SpO2 97 %.    Vent Mode: PRVC FiO2 (%):  [40 %] 40 % Set Rate:  [20 bmp] 20 bmp Vt Set:  [410 mL] 410 mL PEEP:  [5 cmH20] 5 cmH20 Plateau Pressure:  [15 cmH20-20 cmH20] 15 cmH20   Intake/Output Summary (Last 24 hours) at 10/25/2020 1459 Last data filed at 10/25/2020 1400 Gross per 24 hour  Intake 964.57 ml  Output 550 ml  Net 414.57 ml   Filed Weights   10/20/20 2112 10/25/20 0259  Weight: 77 kg 77.3 kg    Examination:  General -resting comfortably, no  distress Eyes - pupils reactive ENT - ETT in place Cardiac - regular rate/rhythm, no murmur Chest -clear to anterior auscultation Abdomen - soft, non tender, + bowel sounds Skin - no rashes Neuro - RASS 0, follows commands  Resolved Hospital Problem list    Circulatory shock  Assessment & Plan:   Acute hypoxic/hypercapnic respiratory failure 2nd to aspiration pneumonia. - Has received 4 days of antibiotics, given no focal infiltrate on chest x-ray, no leukocytosis on admission, suspect aspiration event without clinical pneumonia. Continue to monitor off antibiotics. -I suspect that he can be extubated however given the extent of compression of his trachea from his enlarged esophagus I think this would be challenging. Would prefer to extubate him after this summer..  Large hiatal hernia. GERD, Barrett's esophagus, gastroparesis. - GI consulted - seen by Dr. 13/02/21 from thoracic surgery, EGD completed with esophageal mass ruled out -Plan is for n.p.o. at midnight and OR tomorrow with thoracic surgery for repair of his hiatus We can extubate after that.  Relative adrenal insufficiency. -Hemodynamics improved, will discontinue stress dose steroids  Anemia of critical illness. - f/u CBC - transfuse for Hb < 7 or significant bleeding  Acute metabolic encephalopathy 2nd to sepsis, hypoxia, hypercapnia.  Seizure disorder, Schizophrenia. - RASS goal 0 to -1 - valproate IV, continue home medications - Precedex as needed for sedation  Hypothyroidism. - Synthroid IV  Best practice:  Diet: N.p.o. for now. He is not tolerating tube feeds. PEG tube  discussed with thoracic surgery. DVT prophylaxis: lovenox GI prophylaxis: protonix Mobility: bed rest Code Status: full Disposition: ICU  The patient is critically ill with multiple organ systems failure and requires high complexity decision making for assessment and support, frequent evaluation and titration of therapies, application  of advanced monitoring technologies and extensive interpretation of multiple databases.   Critical Care Time devoted to patient care services described in this note is 35 minutes. This time reflects time of care of this signee Charlott Holler . This critical care time does not reflect separately billable procedures or procedure time, teaching time or supervisory time of PA/NP/Med student/Med Resident etc but could involve care discussion time.  Charlott Holler [place] [facility] 10/25/2020 2:59 PM  Pager: [phone] After hours pager: [phone]   Labs    CMP Latest Ref Rng & Units 10/25/2020 10/24/2020 10/23/2020  Glucose 70 - 99 mg/dL 381(W) 299(B) 716(R)  BUN 8 - 23 mg/dL 67(E) 12 13  Creatinine 0.61 - 1.24 mg/dL 9.38(B) 0.17 5.10  Sodium 135 - 145 mmol/L 144 141 144  Potassium 3.5 - 5.1 mmol/L 3.4(L) 3.7 4.2  Chloride 98 - 111 mmol/L 107 103 108  CO2 22 - 32 mmol/L 27 28 26   Calcium 8.9 - 10.3 mg/dL 8.0(L) 8.0(L) 7.9(L)  Total Protein 6.5 - 8.1 g/dL - - -  Total Bilirubin 0.3 - 1.2 mg/dL - - -  Alkaline Phos 38 - 126 U/L - - -  AST 15 - 41 U/L - - -  ALT 0 - 44 U/L - - -    CBC Latest Ref Rng & Units 10/25/2020 10/24/2020 10/23/2020  WBC 4.0 - 10.5 K/uL 17.7(H) 18.5(H) 18.0(H)  Hemoglobin 13.0 - 17.0 g/dL 8.1(L) 7.8(L) 8.7(L)  Hematocrit 39 - 52 % 26.8(L) 25.6(L) 29.4(L)  Platelets 150 - 400 K/uL 280 207 221    CBG (last 3)  Recent Labs    10/25/20 0315 10/25/20 0735 10/25/20 1127  GLUCAP 124* 121* 90    ABG    Component Value Date/Time   PHART 7.333 (L) 10/22/2020 0620   PCO2ART 53.0 (H) 10/22/2020 0620   PO2ART 79 (L) 10/22/2020 0620   HCO3 27.8 10/22/2020 0620   TCO2 29 10/22/2020 0620   ACIDBASEDEF 1.0 10/05/2007 1720   O2SAT 94.0 10/22/2020 10/24/2020

## 2020-10-26 ENCOUNTER — Inpatient Hospital Stay (HOSPITAL_COMMUNITY): Payer: Medicare Other

## 2020-10-26 ENCOUNTER — Other Ambulatory Visit: Payer: Self-pay

## 2020-10-26 ENCOUNTER — Encounter (HOSPITAL_COMMUNITY)
Admission: EM | Disposition: A | Discharge status: Rehabilitation Facility | Payer: Self-pay | Source: Home / Self Care | Attending: Internal Medicine

## 2020-10-26 ENCOUNTER — Encounter (HOSPITAL_COMMUNITY): Payer: Self-pay | Admitting: Internal Medicine

## 2020-10-26 DIAGNOSIS — Z8719 Personal history of other diseases of the digestive system: Secondary | ICD-10-CM

## 2020-10-26 DIAGNOSIS — K2289 Other specified disease of esophagus: Secondary | ICD-10-CM

## 2020-10-26 DIAGNOSIS — K458 Other specified abdominal hernia without obstruction or gangrene: Secondary | ICD-10-CM | POA: Diagnosis not present

## 2020-10-26 DIAGNOSIS — K2270 Barrett's esophagus without dysplasia: Secondary | ICD-10-CM

## 2020-10-26 HISTORY — PX: ESOPHAGOGASTRODUODENOSCOPY: SHX5428

## 2020-10-26 HISTORY — DX: Personal history of other diseases of the digestive system: Z87.19

## 2020-10-26 HISTORY — PX: PEG PLACEMENT: SHX5437

## 2020-10-26 HISTORY — PX: XI ROBOTIC ASSISTED HIATAL HERNIA REPAIR: SHX6889

## 2020-10-26 LAB — CBC
HCT: 24.6 % — ABNORMAL LOW (ref 39.0–52.0)
Hemoglobin: 7.2 g/dL — ABNORMAL LOW (ref 13.0–17.0)
MCH: 24.7 pg — ABNORMAL LOW (ref 26.0–34.0)
MCHC: 29.3 g/dL — ABNORMAL LOW (ref 30.0–36.0)
MCV: 84.5 fL (ref 80.0–100.0)
Platelets: 271 10*3/uL (ref 150–400)
RBC: 2.91 MIL/uL — ABNORMAL LOW (ref 4.22–5.81)
RDW: 16.4 % — ABNORMAL HIGH (ref 11.5–15.5)
WBC: 10.2 10*3/uL (ref 4.0–10.5)
nRBC: 1.6 % — ABNORMAL HIGH (ref 0.0–0.2)

## 2020-10-26 LAB — GLUCOSE, CAPILLARY
Glucose-Capillary: 76 mg/dL (ref 70–99)
Glucose-Capillary: 66 mg/dL — ABNORMAL LOW (ref 70–99)
Glucose-Capillary: 84 mg/dL (ref 70–99)
Glucose-Capillary: 74 mg/dL (ref 70–99)
Glucose-Capillary: 84 mg/dL (ref 70–99)
Glucose-Capillary: 118 mg/dL — ABNORMAL HIGH (ref 70–99)
Glucose-Capillary: 102 mg/dL — ABNORMAL HIGH (ref 70–99)

## 2020-10-26 LAB — BASIC METABOLIC PANEL
Anion gap: 11 (ref 5–15)
BUN: 27 mg/dL — ABNORMAL HIGH (ref 8–23)
CO2: 26 mmol/L (ref 22–32)
Calcium: 8 mg/dL — ABNORMAL LOW (ref 8.9–10.3)
Chloride: 107 mmol/L (ref 98–111)
Creatinine, Ser: 1.14 mg/dL (ref 0.61–1.24)
GFR, Estimated: >60 mL/min (ref >60)
Glucose, Bld: 84 mg/dL (ref 70–99)
Potassium: 3.2 mmol/L — ABNORMAL LOW (ref 3.5–5.1)
Sodium: 144 mmol/L (ref 135–145)

## 2020-10-26 LAB — PREPARE RBC (CROSSMATCH)

## 2020-10-26 LAB — TRIGLYCERIDES: Triglycerides: 254 mg/dL — ABNORMAL HIGH (ref <150)

## 2020-10-26 SURGERY — REPAIR, HERNIA, HIATAL, ROBOT-ASSISTED
Anesthesia: General | Site: Chest

## 2020-10-26 MED ORDER — BUPIVACAINE LIPOSOME 1.3 % IJ SUSP
INTRAMUSCULAR | Status: DC | PRN
  Administered 2020-10-26: 50 mL

## 2020-10-26 MED ORDER — POTASSIUM CHLORIDE 10 MEQ/50ML IV SOLN
10.0000 meq | INTRAVENOUS | Status: AC
  Administered 2020-10-26 (×6): 10 meq via INTRAVENOUS
  Filled 2020-10-26 (×7): qty 50

## 2020-10-26 MED ORDER — PROPOFOL 10 MG/ML IV BOLUS
INTRAVENOUS | Status: AC
  Filled 2020-10-26: qty 20

## 2020-10-26 MED ORDER — 0.9 % SODIUM CHLORIDE (POUR BTL) OPTIME
TOPICAL | Status: DC | PRN
  Administered 2020-10-26: 700 mL
  Administered 2020-10-26: 2000 mL

## 2020-10-26 MED ORDER — MIDAZOLAM HCL 2 MG/2ML IJ SOLN
INTRAMUSCULAR | Status: AC
  Filled 2020-10-26: qty 2

## 2020-10-26 MED ORDER — LACTATED RINGERS IV SOLN: INTRAVENOUS | Status: DC | PRN

## 2020-10-26 MED ORDER — PHENYLEPHRINE 40 MCG/ML (10ML) SYRINGE FOR IV PUSH (FOR BLOOD PRESSURE SUPPORT)
PREFILLED_SYRINGE | INTRAVENOUS | Status: AC
  Filled 2020-10-26: qty 10

## 2020-10-26 MED ORDER — MIDAZOLAM HCL 5 MG/5ML IJ SOLN
INTRAMUSCULAR | Status: DC | PRN
  Administered 2020-10-26: 2 mg via INTRAVENOUS

## 2020-10-26 MED ORDER — ROCURONIUM BROMIDE 10 MG/ML (PF) SYRINGE
PREFILLED_SYRINGE | INTRAVENOUS | Status: AC
  Filled 2020-10-26: qty 10

## 2020-10-26 MED ORDER — VALPROATE SODIUM 500 MG/5ML IV SOLN
250.0000 mg | Freq: Two times a day (BID) | INTRAVENOUS | Status: DC
  Administered 2020-10-26: 250 mg via INTRAVENOUS
  Filled 2020-10-26 (×3): qty 2.5

## 2020-10-26 MED ORDER — DOCUSATE SODIUM 50 MG/5ML PO LIQD: 100.0000 mg | Freq: Every day | ORAL | Status: DC

## 2020-10-26 MED ORDER — DEXTROSE 5 % IV SOLN: INTRAVENOUS | Status: AC

## 2020-10-26 MED ORDER — EPHEDRINE SULFATE-NACL 50-0.9 MG/10ML-% IV SOSY
PREFILLED_SYRINGE | INTRAVENOUS | Status: DC | PRN
  Administered 2020-10-26: 5 mg via INTRAVENOUS

## 2020-10-26 MED ORDER — DEXTROSE 50 % IV SOLN
INTRAVENOUS | Status: AC
  Administered 2020-10-26: 50 mL
  Filled 2020-10-26: qty 50

## 2020-10-26 MED ORDER — ALBUMIN HUMAN 5 % IV SOLN: INTRAVENOUS | Status: DC | PRN

## 2020-10-26 MED ORDER — CEFAZOLIN SODIUM-DEXTROSE 2-3 GM-%(50ML) IV SOLR
INTRAVENOUS | Status: DC | PRN
  Administered 2020-10-26: 2 g via INTRAVENOUS

## 2020-10-26 MED ORDER — BUPIVACAINE LIPOSOME 1.3 % IJ SUSP
20.0000 mL | INTRAMUSCULAR | Status: DC
  Filled 2020-10-26 (×2): qty 20

## 2020-10-26 MED ORDER — PHENYLEPHRINE HCL-NACL 10-0.9 MG/250ML-% IV SOLN
INTRAVENOUS | Status: DC | PRN
  Administered 2020-10-26: 25 ug/min via INTRAVENOUS
  Administered 2020-10-26: 50 ug/min via INTRAVENOUS

## 2020-10-26 MED ORDER — CEFAZOLIN SODIUM-DEXTROSE 2-4 GM/100ML-% IV SOLN
INTRAVENOUS | Status: AC
  Filled 2020-10-26: qty 100

## 2020-10-26 MED ORDER — FENTANYL CITRATE (PF) 100 MCG/2ML IJ SOLN
INTRAMUSCULAR | Status: DC | PRN
  Administered 2020-10-26: 100 ug via INTRAVENOUS
  Administered 2020-10-26: 50 ug via INTRAVENOUS
  Administered 2020-10-26: 100 ug via INTRAVENOUS

## 2020-10-26 MED ORDER — SODIUM CHLORIDE 0.9% IV SOLUTION: Freq: Once | INTRAVENOUS | Status: DC

## 2020-10-26 MED ORDER — ROCURONIUM BROMIDE 10 MG/ML (PF) SYRINGE
PREFILLED_SYRINGE | INTRAVENOUS | Status: DC | PRN
  Administered 2020-10-26 (×3): 20 mg via INTRAVENOUS
  Administered 2020-10-26: 60 mg via INTRAVENOUS

## 2020-10-26 MED ORDER — BUPIVACAINE HCL (PF) 0.5 % IJ SOLN
INTRAMUSCULAR | Status: AC
  Filled 2020-10-26: qty 30

## 2020-10-26 MED ORDER — PROPOFOL 1000 MG/100ML IV EMUL
5.0000 ug/kg/min | INTRAVENOUS | Status: DC
  Administered 2020-10-26: 40 ug/kg/min via INTRAVENOUS
  Administered 2020-10-27: 10 ug/kg/min via INTRAVENOUS
  Filled 2020-10-26 (×2): qty 100

## 2020-10-26 MED ORDER — FENTANYL CITRATE (PF) 250 MCG/5ML IJ SOLN
INTRAMUSCULAR | Status: AC
  Filled 2020-10-26: qty 5

## 2020-10-26 MED ORDER — PHENYLEPHRINE 40 MCG/ML (10ML) SYRINGE FOR IV PUSH (FOR BLOOD PRESSURE SUPPORT)
PREFILLED_SYRINGE | INTRAVENOUS | Status: DC | PRN
  Administered 2020-10-26: 120 ug via INTRAVENOUS

## 2020-10-26 SURGICAL SUPPLY — 109 items
ADH SKN CLS APL DERMABOND .7 (GAUZE/BANDAGES/DRESSINGS) ×6
ADH SKN CLS LQ APL DERMABOND (GAUZE/BANDAGES/DRESSINGS) ×3
BLADE CLIPPER SURG (BLADE) ×4 IMPLANT
BLADE SURG 11 STRL SS (BLADE) ×4 IMPLANT
BUTTON OLYMPUS DEFENDO 5 PIECE (MISCELLANEOUS) ×4 IMPLANT
CANISTER SUCT 3000ML PPV (MISCELLANEOUS) ×8 IMPLANT
CANNULA REDUC XI 12-8 STAPL (CANNULA)
CANNULA REDUCER 12-8 DVNC XI (CANNULA) IMPLANT
CATH THORACIC 28FR (CATHETERS) ×4 IMPLANT
CNTNR URN SCR LID CUP LEK RST (MISCELLANEOUS) ×3 IMPLANT
CONT SPEC 4OZ STRL OR WHT (MISCELLANEOUS) ×8
COVER BACK TABLE 60X90IN (DRAPES) ×4 IMPLANT
DEFOGGER SCOPE WARMER CLEARIFY (MISCELLANEOUS) ×4 IMPLANT
DERMABOND ADHESIVE PROPEN (GAUZE/BANDAGES/DRESSINGS) ×1
DERMABOND ADVANCED (GAUZE/BANDAGES/DRESSINGS) ×2
DERMABOND ADVANCED .7 DNX12 (GAUZE/BANDAGES/DRESSINGS) ×6 IMPLANT
DERMABOND ADVANCED .7 DNX6 (GAUZE/BANDAGES/DRESSINGS) IMPLANT
DEVICE SUTURE ENDOST 10MM (ENDOMECHANICALS) IMPLANT
DRAIN PENROSE 1/4X12 LTX STRL (WOUND CARE) ×4 IMPLANT
DRAPE COLUMN DVNC XI (DISPOSABLE) ×12 IMPLANT
DRAPE CV SPLIT W-CLR ANES SCRN (DRAPES) ×4 IMPLANT
DRAPE DA VINCI XI COLUMN (DISPOSABLE) ×16
DRAPE HALF SHEET 40X57 (DRAPES) ×1 IMPLANT
DRAPE INCISE IOBAN 66X45 STRL (DRAPES) IMPLANT
DRAPE ORTHO SPLIT 77X108 STRL (DRAPES) ×8
DRAPE SURG ORHT 6 SPLT 77X108 (DRAPES) ×3 IMPLANT
ELECT REM PT RETURN 9FT ADLT (ELECTROSURGICAL) ×4
ELECTRODE REM PT RTRN 9FT ADLT (ELECTROSURGICAL) ×3 IMPLANT
FELT TEFLON 1X6 (MISCELLANEOUS) ×1 IMPLANT
FORCEPS GRASP COMBO 8X230 (FORCEP) ×3 IMPLANT
GAUZE SPONGE 4X4 12PLY STRL (GAUZE/BANDAGES/DRESSINGS) ×4 IMPLANT
GLOVE BIO SURGEON STRL SZ7 (GLOVE) ×4 IMPLANT
GLOVE BIO SURGEON STRL SZ7.5 (GLOVE) ×6 IMPLANT
GLOVE BIOGEL PI IND STRL 7.5 (GLOVE) IMPLANT
GLOVE BIOGEL PI IND STRL 8 (GLOVE) IMPLANT
GLOVE BIOGEL PI INDICATOR 7.5 (GLOVE) ×1
GLOVE BIOGEL PI INDICATOR 8 (GLOVE) ×1
GLOVE ECLIPSE 7.5 STRL STRAW (GLOVE) ×1 IMPLANT
GLOVE INDICATOR 7.5 STRL GRN (GLOVE) ×1 IMPLANT
GLOVE SS BIOGEL STRL SZ 7 (GLOVE) IMPLANT
GLOVE SUPERSENSE BIOGEL SZ 7 (GLOVE) ×1
GLOVE SURG SS PI 6.5 STRL IVOR (GLOVE) ×3 IMPLANT
GLOVE SURG SYN 8.0 (GLOVE) ×4 IMPLANT
GLOVE SURG SYN 8.0 PF PI (GLOVE) IMPLANT
GOWN STRL REUS W/ TWL LRG LVL3 (GOWN DISPOSABLE) ×3 IMPLANT
GOWN STRL REUS W/ TWL XL LVL3 (GOWN DISPOSABLE) ×6 IMPLANT
GOWN STRL REUS W/TWL 2XL LVL3 (GOWN DISPOSABLE) ×5 IMPLANT
GOWN STRL REUS W/TWL LRG LVL3 (GOWN DISPOSABLE) ×4
GOWN STRL REUS W/TWL XL LVL3 (GOWN DISPOSABLE) ×24
GRASPER SUT TROCAR 14GX15 (MISCELLANEOUS) ×1 IMPLANT
IRRIGATION STRYKERFLOW (MISCELLANEOUS) ×3 IMPLANT
IRRIGATOR STRYKERFLOW (MISCELLANEOUS) ×4
IV NS 1000ML (IV SOLUTION)
IV NS 1000ML BAXH (IV SOLUTION) IMPLANT
KIT BASIN OR (CUSTOM PROCEDURE TRAY) ×4 IMPLANT
KIT DILATOR VASC 18G NDL (KITS) ×1 IMPLANT
KIT TURNOVER KIT B (KITS) ×4 IMPLANT
MARKER SKIN DUAL TIP RULER LAB (MISCELLANEOUS) ×4 IMPLANT
NDL 18GX1X1/2 (RX/OR ONLY) (NEEDLE) IMPLANT
NDL SCLEROTHERAPY 23X2X240 (NEEDLE) IMPLANT
NEEDLE 18GX1X1/2 (RX/OR ONLY) (NEEDLE) IMPLANT
NEEDLE SCLEROTHERAPY 23X2X240 (NEEDLE) IMPLANT
NS IRRIG 1000ML POUR BTL (IV SOLUTION) ×9 IMPLANT
OBTURATOR OPTICAL STANDARD 8MM (TROCAR) ×4
OBTURATOR OPTICAL STND 8 DVNC (TROCAR) ×3
OBTURATOR OPTICALSTD 8 DVNC (TROCAR) ×3 IMPLANT
OIL SILICONE PENTAX (PARTS (SERVICE/REPAIRS)) IMPLANT
PACK CHEST (CUSTOM PROCEDURE TRAY) ×4 IMPLANT
PACK UNIVERSAL I (CUSTOM PROCEDURE TRAY) ×4 IMPLANT
PAD ARMBOARD 7.5X6 YLW CONV (MISCELLANEOUS) ×8 IMPLANT
SEAL CANN UNIV 5-8 DVNC XI (MISCELLANEOUS) ×12 IMPLANT
SEAL XI 5MM-8MM UNIVERSAL (MISCELLANEOUS) ×16
SEALER SYNCHRO 8 IS4000 DV (MISCELLANEOUS) ×4
SEALER SYNCHRO 8 IS4000 DVNC (MISCELLANEOUS) IMPLANT
SET IRRIG TUBING LAPAROSCOPIC (IRRIGATION / IRRIGATOR) IMPLANT
SET TUBE SMOKE EVAC HIGH FLOW (TUBING) ×3 IMPLANT
SHEET MEDIUM DRAPE 40X70 STRL (DRAPES) ×4 IMPLANT
SOL ANTI FOG 6CC (MISCELLANEOUS) IMPLANT
SOLUTION ANTI FOG 6CC (MISCELLANEOUS)
STAPLER CANNULA SEAL DVNC XI (STAPLE) IMPLANT
STAPLER CANNULA SEAL XI (STAPLE)
STOPCOCK 4 WAY LG BORE MALE ST (IV SETS) ×4 IMPLANT
SUT ETHIBOND 0 36 GRN (SUTURE) ×10 IMPLANT
SUT SILK  1 MH (SUTURE) ×4
SUT SILK 1 MH (SUTURE) ×3 IMPLANT
SUT SURGIDAC NAB ES-9 0 48 120 (SUTURE) IMPLANT
SUT VIC AB 3-0 SH 27 (SUTURE) ×12
SUT VIC AB 3-0 SH 27X BRD (SUTURE) IMPLANT
SUT VIC AB 3-0 X1 27 (SUTURE) ×6 IMPLANT
SUT VICRYL 0 UR6 27IN ABS (SUTURE) ×9 IMPLANT
SYR 10ML LL (SYRINGE) ×4 IMPLANT
SYR 20ML ECCENTRIC (SYRINGE) ×4 IMPLANT
SYR 30ML SLIP (SYRINGE) ×4 IMPLANT
SYR 50ML LL SCALE MARK (SYRINGE) ×4 IMPLANT
SYR BULB IRRIG 60ML STRL (SYRINGE) ×1 IMPLANT
SYSTEM SAHARA CHEST DRAIN ATS (WOUND CARE) ×4 IMPLANT
TAPE CLOTH SURG 4X10 WHT LF (GAUZE/BANDAGES/DRESSINGS) ×1 IMPLANT
TOWEL GREEN STERILE (TOWEL DISPOSABLE) ×4 IMPLANT
TOWEL GREEN STERILE FF (TOWEL DISPOSABLE) ×4 IMPLANT
TRAY FOLEY MTR SLVR 16FR STAT (SET/KITS/TRAYS/PACK) ×4 IMPLANT
TROCAR XCEL 12X100 BLDLESS (ENDOMECHANICALS) ×5 IMPLANT
TROCAR XCEL BLADELESS 5X75MML (TROCAR) ×4 IMPLANT
TROCAR XCEL NON-BLD 5MMX100MML (ENDOMECHANICALS) IMPLANT
TUBE CONNECTING 20X1/4 (TUBING) ×4 IMPLANT
TUBE ENDOVIVE SAFETY PEG 24 (TUBING) ×1 IMPLANT
TUBING ENDO SMARTCAP (MISCELLANEOUS) ×4 IMPLANT
TUBING EXTENTION W/L.L. (IV SETS) ×4 IMPLANT
UNDERPAD 30X36 HEAVY ABSORB (UNDERPADS AND DIAPERS) ×4 IMPLANT
WATER STERILE IRR 1000ML POUR (IV SOLUTION) ×8 IMPLANT

## 2020-10-26 NOTE — Op Note (Signed)
[address]       [place] [address]             [phone]        10/26/2020  Patient:  [name] Pre-Op Dx: Paraesophageal hernia   Massively dilated esophagus.   Barrett's esophagus yes Post-op Dx: Same Procedure: - Esophagoscopy - Robotic assisted laparoscopy - Paraesophageal hernia repair with a pledgeted felt strips - Percutaneous gastrostomy tube with a 24 [place] tube   Surgeon and Role:      * Vona Whiters, Eliezer Lofts, MD - Primary    Webb Laws, PA-C- assisting  Anesthesia  general EBL: 100 ml Blood Administration: 1 unit of blood Specimen: Esophageal lymph node   Counts: correct   Indications: 64 year old gentleman with a long history of aspiration pneumonia giant paraesophageal hernia.  Cross-sectional imaging also exhibited a little massively dilated esophagus.  He was placed on a ventilator for respiratory failure due to recurrent aspiration pneumonia this admission.  CT surgery was consulted to assist paraesophageal hernia repair for potential decompression of his esophagus.  Findings: Pallidus esophagus and stomach.  The stomach was also very floppy and difficult to decompress on EGD.  Giant paraesophageal hernia with a large hiatal defect.  There was stomach and omentum within the hernia defect.  The hernia sac was densely adherent to the bilateral pleural and both were opened and adequate mobilization.  At the completion of the reduction there was approximately 3 to 4 cm of intra-abdominal esophagus.  There are also enlarged lymph nodes surrounding the esophagus and one was taken for specimen.  Operative Technique: After the risks, benefits and alternatives were thoroughly discussed, the patient was brought to the operative theatre.  Anesthesia was induced, and the esophagoscope was passed through the oropharynx down to the stomach.  The scope was retroflexed and the hiatal hernia was clearly evident.  The scope was pulled back  and the mucosal surface of the esophagus was visualized.  The scope was then parked at 25 cm from the incisors.  The patient was then prepped and draped in normal sterile fashion.  An appropriate surgical pause was performed, and pre-operative antibiotics were dosed accordingly.  We began with a 1 cm incision 15 cm caudad from the xiphoid and slightly lateral to the umbilicus.  Using an Optiview we entered the peritoneal space.  The abdomen was then insufflated with CO2.  3 other robotic ports were placed to triangulate the hiatus.  Another 12 mm port was placed in place at the level of the umbilicus laterally for an assistant port and another 5 mm trocar was placed in the right lower quadrant for liver retractor.  The patient was then placed in steep reverse Trendelenburg and the liver was elevated to expose the esophageal hiatus.  And then the robot was docked.  We began by dividing the gastrohepatic ligament to expose the right diaphragmatic crus and then dissected the hernia sac in a clockwise fashion to mobilize there the stomach and esophagus.  We then divided the short gastrics and moved towards the right crus and completed our dissection along the esophageal hiatus.  A Penrose drain was then used to encircle the the esophagus and we continued our dissection up into the mediastinum.  Once we had achieved 3 to 4 cm of intra-abdominal esophagus we then proceeded to reapproximate the crura with 0 Ethibond sutures and felt pledgets in an interrupted fashion.  The gastroscope was passed down through the  lower esophageal sphincter into the stomach and would act as our bougie during this repair.  Due to strong concern for esophageal dysmotility I elected not to perform a fundoplication.  The liver retractor was removed and all ports were removed under direct visualization.  The skin and soft tissue were closed with absorbable suture.  Next we placed a 24 French PEG tube through the abdominal wall under direct  visualization.  We used a pull technique and secured it at 4 cm.  I rescoped the patient to ensure that the button was in good position on the stomach.  The patient tolerated the procedure without any immediate complications, and was transferred to the ICU in guarded condition.  [name]

## 2020-10-26 NOTE — Progress Notes (Signed)
eLink Physician-Brief Progress Note Patient Name: [name] DOB: 1956/08/29 MRN: [id number]   Date of Service  10/26/2020  HPI/Events of Note  Agitation - request for R wrist restraint.   eICU Interventions  Will order soft R wrist restraint X 13 hour.     Intervention Category Major Interventions: Delirium, psychosis, severe agitation - evaluation and management  [name] 10/26/2020, 9:40 PM

## 2020-10-26 NOTE — Progress Notes (Signed)
     [address]       [place] [address]             [phone]       No events  Vitals:   10/26/20 0830 10/26/20 0845  BP: 98/62 108/68  Pulse: 79 96  Resp: 20 20  Temp:    SpO2: 99% 97%   Sedated Sinus Vent Mode: PRVC FiO2 (%):  [40 %] 40 % Set Rate:  [20 bmp] 20 bmp Vt Set:  [410 mL] 410 mL PEEP:  [5 cmH20] 5 cmH20 Plateau Pressure:  [15 cmH20-18 cmH20] 17 cmH20  64 yo male with large paraesophageal hernia OR today for robotic paraesophageal hernia repair, and PEG tube placement

## 2020-10-26 NOTE — Progress Notes (Signed)
eLink Physician-Brief Progress Note Patient Name: [name] DOB: [birth year] MRN: [id number]   Date of Service  10/26/2020  HPI/Events of Note  Returns from OR intubated, ventilated and on Propofol. No order for Propofol.   eICU Interventions  Plan: 1. Propofol IV infusion. Titrate to RASS = 0  To -1.      Intervention Category Major Interventions: Other:  Lenell Antu 10/26/2020, 9:10 PM

## 2020-10-26 NOTE — Progress Notes (Signed)
Patient was transferred to OR and I have contacted his sister .

## 2020-10-26 NOTE — Progress Notes (Signed)
Assisted tele visit to patient with family member.  Denilson Salminen McEachran, RN  

## 2020-10-26 NOTE — Progress Notes (Signed)
NAME:  [name], MRN:  [id number], DOB:  1956/11/05, LOS: 5 ADMISSION DATE:  10/20/2020, CONSULTATION DATE:  10/21/2020 REFERRING MD: Dr. Loney Loh, CHIEF COMPLAINT:  Dyspnea  Brief History   64 year old male presented to ED after syncopal episode and was found to have large hiatal hernia compressing trachea.   Past Medical History  GERD, Gastroparesis, Barrett's esophagus, HLD, HTN, Iron deficiency anemia, Seizures, Schizophrenia, Developmental delay  Significant Hospital Events   10/29 admit, EGD done 10/30 transferred to ICU for respiratory distress, intubated.  Consults:  CVTS GI  Procedures:  ETT 10/30 >> Lt [place] CVL 10/30 >>   Significant Diagnostic Tests:  CTA chest 10/29 > no PE, RUL ATX CT abd/pelvis 10/29  > large, stable gastric hernia with associated esophageal dilation and subsequent mass effect on the trachea.    Micro Data:  COVID 10/28 >> negative MRSA PCR 10/28 >> negative Blood 10/28 >> negative Sputum 10/30 >> negative  Antimicrobials:  Unasyn 10/28 >> 11/1  Interim history/subjective:  Overnight patient agitated restarted on fentanyl 50 mcg. Patient awake and following commands at bedside.  Objective   Blood pressure 92/60, pulse 77, temperature 98.5 F (36.9 C), temperature source Axillary, resp. rate 20, height (S) 5\' 4"  (1.626 m), weight 77.3 kg, SpO2 98 %.    Vent Mode: PRVC FiO2 (%):  [40 %] 40 % Set Rate:  [20 bmp] 20 bmp Vt Set:  [410 mL] 410 mL PEEP:  [5 cmH20] 5 cmH20 Plateau Pressure:  [15 cmH20-18 cmH20] 17 cmH20   Intake/Output Summary (Last 24 hours) at 10/26/2020 0741 Last data filed at 10/26/2020 0600 Gross per 24 hour  Intake 535.31 ml  Output 800 ml  Net -264.69 ml   Filed Weights   10/20/20 2112 10/25/20 0259  Weight: 77 kg 77.3 kg   Examination: General: Awake resting comfortably, no acute distress HENT: ETT inplace Lungs: clear to ascultation  Cardiovascular: normal S1 and S2, regular rate and rhythm, no   murmur Abdomen: Mildly distended, soft, non tender, + bs Extremities: warm, dry, no edema Neuro: Rass 0 following commands Skin: no rashes   Resolved Hospital Problem list   Circulatory shock  Assessment & Plan:   Acute hypoxic/hypercapnic respiratory failure 2nd to aspiration pneumonia. - Has received 4 days of antibiotics, given no focal infiltrate on chest x-ray, no leukocytosis on admission, suspect aspiration event without clinical pneumonia. Continues to be afebrile off antibiotics  - Will postpone extubation until after repair of hiatal hernia give compression of trachea from enlarged esophagus  Large hiatal hernia. GERD, Barrett's esophagus, gastroparesis. - GI consulted - seen by Dr. 13/02/21 from thoracic surgery, EGD completed with esophageal mass ruled out - OR tomorrow with thoracic surgery hiatal hernia repair and peg tube  - plan to extubate patient after hiatal hernia repair  Anemia of critical illness. - f/u CBC - transfuse for Hb < 7 or significant bleeding  Acute metabolic encephalopathy 2nd to sepsis, hypoxia, hypercapnia.  Seizure disorder, Schizophrenia. - RASS goal 0 to -1 - Fentanyl as needed for sedation - Continue home valproic acid 250 mg BID, quetiapine 400mg  BID,  Lamotrigine 100 mg daily  Constipation Mild abdominal distention on exam today. No BMs since 10/28. Will start on bowel regimen after surgery today.  Hypothyroidism. - Synthroid IV  Best practice:  Diet: N.p.o. for now.  PEG tube placement today by thoracic surgery. DVT prophylaxis: lovenox GI prophylaxis: protonix Mobility: bed rest Code Status: full Disposition: ICU  Labs  CBC: Recent Labs  Lab 10/21/20 0516 10/21/20 0516 10/22/20 0346 10/22/20 0346 10/22/20 0620 10/23/20 0402 10/24/20 0408 10/25/20 0307 10/26/20 0330  WBC 6.5   < > 7.2  --   --  18.0* 18.5* 17.7* 10.2  NEUTROABS 4.6  --   --   --   --   --   --   --   --   HGB 8.2*   < > 8.6*   < > 9.2* 8.7*  7.8* 8.1* 7.2*  HCT 28.1*   < > 28.6*   < > 27.0* 29.4* 25.6* 26.8* 24.6*  MCV 87.5   < > 85.4  --   --  86.7 84.5 85.4 84.5  PLT 231   < > 237  --   --  221 207 280 271   < > = values in this interval not displayed.    Basic Metabolic Panel: Recent Labs  Lab 10/22/20 0346 10/22/20 0346 10/22/20 0620 10/23/20 0402 10/24/20 0408 10/24/20 1115 10/25/20 0307 10/25/20 1552 10/26/20 0330  NA 139   < > 141 144 141  --  144  --  144  K 4.3   < > 4.0 4.2 3.7  --  3.4*  --  3.2*  CL 103  --   --  108 103  --  107  --  107  CO2 25  --   --  26 28  --  27  --  26  GLUCOSE 116*  --   --  130* 146*  --  131*  --  84  BUN 18  --   --  13 12  --  28*  --  27*  CREATININE 1.18  --   --  1.23 1.10  --  1.26*  --  1.14  CALCIUM 8.2*  --   --  7.9* 8.0*  --  8.0*  --  8.0*  MG 2.1  --   --   --   --  2.3 2.7* 2.4  --   PHOS 3.5  --   --   --   --  3.4 3.6 2.6  --    < > = values in this interval not displayed.   GFR: Estimated Creatinine Clearance: 63.1 mL/min (by C-G formula based on SCr of 1.14 mg/dL). Recent Labs  Lab 10/20/20 2200 10/20/20 2228 10/20/20 2320 10/21/20 0516 10/23/20 0402 10/24/20 0408 10/25/20 0307 10/26/20 0330  PROCALCITON 0.11  --   --   --   --   --   --   --   WBC  --   --   --    < > 18.0* 18.5* 17.7* 10.2  LATICACIDVEN  --  1.5 1.4  --   --   --   --   --    < > = values in this interval not displayed.    Liver Function Tests: Recent Labs  Lab 10/20/20 2200 10/21/20 0914 10/22/20 0346  AST 58*  --   --   ALT 44  --   --   ALKPHOS 57  --   --   BILITOT 0.4  --   --   PROT 6.2*  --   --   ALBUMIN 3.2* 2.6* 3.0*   No results for input(s): LIPASE, AMYLASE in the last 168 hours. No results for input(s): AMMONIA in the last 168 hours.  ABG    Component Value Date/Time   PHART 7.333 (L) 10/22/2020 2947  PCO2ART 53.0 (H) 10/22/2020 0620   PO2ART 79 (L) 10/22/2020 0620   HCO3 27.8 10/22/2020 0620   TCO2 29 10/22/2020 0620   ACIDBASEDEF 1.0  10/05/2007 1720   O2SAT 94.0 10/22/2020 0620     Coagulation Profile: No results for input(s): INR, PROTIME in the last 168 hours.  Cardiac Enzymes: No results for input(s): CKTOTAL, CKMB, CKMBINDEX, TROPONINI in the last 168 hours.  HbA1C: Hgb A1c MFr Bld  Date/Time Value Ref Range Status  10/22/2020 03:46 AM 5.8 (H) 4.8 - 5.6 % Final    Comment:    (NOTE) Pre diabetes:          5.7%-6.4%  Diabetes:              >6.4%  Glycemic control for   <7.0% adults with diabetes   10/05/2007 06:17 AM   Final   5.4 (NOTE)   The ADA recommends the following therapeutic goals for glycemic   control related to Hgb A1C measurement:   Goal of Therapy:   < 7.0% Hgb A1C   Action Suggested:  > 8.0% Hgb A1C   Ref:  Diabetes Care, 22, Suppl. 1, 1999    CBG: Recent Labs  Lab 10/25/20 1554 10/25/20 1934 10/25/20 2313 10/26/20 0326 10/26/20 0727  GLUCAP 105* 83 91 76 66*    Past Medical History  He,  has a past medical history of Barrett esophagus, Dysphagia, Gastroparesis, GERD (gastroesophageal reflux disease), Hypercholesteremia, Hypertension, Iron deficiency anemia, Mental retardation, Schizophrenia (HCC), and Seizure (HCC).   Surgical History    Past Surgical History:  Procedure Laterality Date  . BIOPSY  10/21/2020   Procedure: BIOPSY;  Surgeon: Iva Boop, MD;  Location: [facility] ENDOSCOPY;  Service: Endoscopy;;  . CRANIOTOMY FOR CYST FENESTRATION    . ESOPHAGOGASTRODUODENOSCOPY N/A 01/09/2015   Procedure: ESOPHAGOGASTRODUODENOSCOPY (EGD);  Surgeon: Louis Meckel, MD;  Location: [facility] ENDOSCOPY;  Service: Endoscopy;  Laterality: N/A;  . ESOPHAGOGASTRODUODENOSCOPY (EGD) WITH PROPOFOL N/A 10/21/2020   Procedure: ESOPHAGOGASTRODUODENOSCOPY (EGD) WITH PROPOFOL;  Surgeon: Iva Boop, MD;  Location: [facility] ENDOSCOPY;  Service: Endoscopy;  Laterality: N/A;     Social History   reports that he has never smoked. He has never used smokeless tobacco. He reports that he does not drink  alcohol and does not use drugs.   Family History   His family history is not on file.   Allergies Allergies  Allergen Reactions  . Vancomycin Other (See Comments)    Unknown-possible hives      Home Medications  Prior to Admission medications   Medication Sig Start Date End Date Taking? Authorizing Provider  benazepril (LOTENSIN) 20 MG tablet Take 20 mg by mouth daily.   Yes [provider]  Cholecalciferol (VITAMIN D) 2000 units tablet Take 2,000 Units by mouth daily.   Yes [provider]  divalproex (DEPAKOTE ER) 500 MG 24 hr tablet Take 500 mg by mouth daily.   Yes [provider]  esomeprazole (NEXIUM) 40 MG capsule Take 40 mg by mouth in the morning and at bedtime.   Yes [provider]  lamoTRIgine (LAMICTAL) 100 MG tablet Take 100 mg by mouth daily.   Yes [provider]  levothyroxine (SYNTHROID, LEVOTHROID) 75 MCG tablet Take 75 mcg by mouth daily.   Yes [provider]  metoCLOPramide (REGLAN) 5 MG tablet Take 1 tablet (5 mg total) by mouth 3 (three) times daily before meals. Patient taking differently: Take 5 mg by mouth in the morning and at  bedtime.  07/27/15  Yes Regalado, Belkys A, MD  propranolol (INDERAL) 10 MG tablet Take 10 mg by mouth daily. 03/31/18  Yes [provider]  QUEtiapine (SEROQUEL) 400 MG tablet Take 400 mg by mouth 2 (two) times daily.    Yes [provider]  rosuvastatin (CRESTOR) 20 MG tablet Take 20 mg by mouth daily.   Yes [provider]     Quincy SimmondsJessica Lafern Brinkley, MD PGY-1 [facility] 10/26/20 07:41 AM

## 2020-10-26 NOTE — Brief Op Note (Signed)
10/26/2020  7:29 PM  PATIENT:  [name]  64 y.o. male  PRE-OPERATIVE DIAGNOSIS:  paraesophageal hernia  POST-OPERATIVE DIAGNOSIS:  paraesophageal hernia  PROCEDURE:  Procedure(s): XI ROBOTIC ASSISTED HIATAL HERNIA REPAIR (N/A) ESOPHAGOGASTRODUODENOSCOPY (EGD) (N/A) PERCUTANEOUS ENDOSCOPIC GASTROSTOMY (PEG) PLACEMENT (N/A)  SURGEON:  Surgeon(s) and Role:    * Lightfoot, Eliezer Lofts, MD - Primary  PHYSICIAN ASSISTANT: Aalliyah Kilker PA-C  ANESTHESIA:   general  EBL:  0 mL   BLOOD ADMINISTERED:none  DRAINS: FEEDING TUBE (peg) CURRENTLY TO GRAVITY   LOCAL MEDICATIONS USED:  NONE  SPECIMEN:  Source of Specimen:  PARAESOPHAGEAL LYMPH NODE X 1  DISPOSITION OF SPECIMEN:  PATHOLOGY  COUNTS:  YES  TOURNIQUET:  * No tourniquets in log *  DICTATION: .Dragon Dictation  PLAN OF CARE: Admit to inpatient   PATIENT DISPOSITION:  ICU - intubated and hemodynamically stable.   Delay start of Pharmacological VTE agent (>24hrs) due to surgical blood loss or risk of bleeding: yes  COMPLICATIONS: NO KNOWN

## 2020-10-26 NOTE — Anesthesia Preprocedure Evaluation (Addendum)
Anesthesia Evaluation  Patient identified by MRN, date of birth, ID band Patient unresponsive    Reviewed: Allergy & Precautions, NPO status , Patient's Chart, lab work & pertinent test results, Unable to perform ROS - Chart review only  History of Anesthesia Complications Negative for: history of anesthetic complications  Airway Mallampati: Intubated       Dental  (+) Dental Advisory Given   Pulmonary  VDRF: intubated, Ventilated and sedated 10/20/2020 SARS coronavirus NEG   breath sounds clear to auscultation       Cardiovascular hypertension, (-) angina Rhythm:Regular Rate:Tachycardia  09/2020 ECHO: EF 70-75%, no significant valvular abnormalities   Neuro/Psych Seizures -,  Depression Schizophrenia L hemiparesis    GI/Hepatic Neg liver ROS, hiatal hernia, GERD  Medicated,  Endo/Other  Hypothyroidism   Renal/GU negative Renal ROS     Musculoskeletal   Abdominal   Peds  Hematology  (+) Blood dyscrasia (Hb 7.2), anemia ,   Anesthesia Other Findings   Reproductive/Obstetrics                            Anesthesia Physical Anesthesia Plan  ASA: IV  Anesthesia Plan: General   Post-op Pain Management:    Induction: Inhalational  PONV Risk Score and Plan: 2 and Treatment may vary due to age or medical condition  Airway Management Planned: Oral ETT  Additional Equipment: Arterial line  Intra-op Plan:   Post-operative Plan: Post-operative intubation/ventilation  Informed Consent: I have reviewed the patients History and Physical, chart, labs and discussed the procedure including the risks, benefits and alternatives for the proposed anesthesia with the patient or authorized representative who has indicated his/her understanding and acceptance.     History available from chart only and Consent reviewed with POA  Plan Discussed with: CRNA and Surgeon  Anesthesia Plan Comments:  (Discussed with pt's sister, [name], by telephone)       Anesthesia Quick Evaluation

## 2020-10-26 NOTE — Progress Notes (Signed)
eLink Physician-Brief Progress Note Patient Name: [name] DOB: [birth year] MRN: [id number]   Date of Service  10/26/2020  HPI/Events of Note  CorTrak removed at surgery. Patient not able to get Valproic Acid or Lamictal per tube. IV formulation of Lamictal not available.   eICU Interventions  Will change Valproic acid from PO to IV.      Intervention Category Major Interventions: Other:  Lenell Antu 10/26/2020, 10:15 PM

## 2020-10-26 NOTE — Anesthesia Postprocedure Evaluation (Signed)
Anesthesia Post Note  Patient: [name]  Procedure(s) Performed: XI ROBOTIC ASSISTED HIATAL HERNIA REPAIR (N/A Chest) ESOPHAGOGASTRODUODENOSCOPY (EGD) (N/A ) PERCUTANEOUS ENDOSCOPIC GASTROSTOMY (PEG) PLACEMENT (N/A Abdomen)     Patient location during evaluation: SICU Anesthesia Type: General Level of consciousness: sedated Pain management: pain level controlled Vital Signs Assessment: post-procedure vital signs reviewed and stable Respiratory status: patient remains intubated per anesthesia plan Cardiovascular status: stable Postop Assessment: no apparent nausea or vomiting Anesthetic complications: no   No complications documented.  Last Vitals:  Vitals:   10/26/20 2230 10/26/20 2329  BP:    Pulse: 86   Resp: (!) 21   Temp:  36.9 C  SpO2: 91%     Last Pain:  Vitals:   10/26/20 2329  TempSrc: Oral  PainSc:                  Nelle Don Marne Meline

## 2020-10-26 NOTE — Progress Notes (Signed)
CBG 66 25 mls D50 given and repeat CBG was 84

## 2020-10-26 NOTE — Progress Notes (Signed)
Patient arrived to the ICU from the OR still intubated. Patient connected back to the ventilator on the same settings.

## 2020-10-26 NOTE — Anesthesia Procedure Notes (Signed)
Arterial Line Insertion Start/End11/02/2020 4:00 PM Performed by: Rosalio Macadamia, CRNA, CRNA  Patient location: OR. Left, radial was placed Catheter size: 20 G Maximum sterile barriers used   Attempts: 1 Procedure performed without using ultrasound guided technique. Following insertion, dressing applied and Biopatch.

## 2020-10-26 NOTE — Anesthesia Procedure Notes (Signed)
Procedure Name: Intubation Date/Time: 10/26/2020 3:35 PM Performed by: Gwenyth Allegra, CRNA Pre-anesthesia Checklist: Patient identified, Emergency Drugs available, Suction available, Patient being monitored and Timeout performed Patient Re-evaluated:Patient Re-evaluated prior to induction Preoxygenation: Pre-oxygenation with 100% oxygen Induction Type: IV induction Placement Confirmation: positive ETCO2 and breath sounds checked- equal and bilateral Secured at: 22 cm Tube secured with: Tape

## 2020-10-26 NOTE — Transfer of Care (Addendum)
Immediate Anesthesia Transfer of Care Note  Patient: [name]  Procedure(s) Performed: XI ROBOTIC ASSISTED HIATAL HERNIA REPAIR (N/A Chest) ESOPHAGOGASTRODUODENOSCOPY (EGD) (N/A ) PERCUTANEOUS ENDOSCOPIC GASTROSTOMY (PEG) PLACEMENT (N/A Abdomen)  Patient Location: ICU  Anesthesia Type:General  Level of Consciousness: sedated, unresponsive and Patient remains intubated per anesthesia plan  Airway & Oxygen Therapy: Patient remains intubated per anesthesia plan and Patient placed on Ventilator (see vital sign flow sheet for setting)  Post-op Assessment: Report given to RN and Post -op Vital signs reviewed and stable  Post vital signs: Reviewed and stable  Last Vitals:  Vitals Value Taken Time  BP 121/77   Temp    Pulse 89   Resp 15   SpO2 95     Last Pain:  Vitals:   10/26/20 1512  TempSrc: Oral  PainSc:          Complications: No complications documented.

## 2020-10-27 ENCOUNTER — Encounter (HOSPITAL_COMMUNITY): Payer: Self-pay | Admitting: Thoracic Surgery (Cardiothoracic Vascular Surgery)

## 2020-10-27 ENCOUNTER — Inpatient Hospital Stay (HOSPITAL_COMMUNITY): Payer: Medicare Other

## 2020-10-27 DIAGNOSIS — Z8719 Personal history of other diseases of the digestive system: Secondary | ICD-10-CM

## 2020-10-27 DIAGNOSIS — Z9889 Other specified postprocedural states: Secondary | ICD-10-CM | POA: Diagnosis not present

## 2020-10-27 DIAGNOSIS — K449 Diaphragmatic hernia without obstruction or gangrene: Secondary | ICD-10-CM | POA: Diagnosis not present

## 2020-10-27 DIAGNOSIS — J9601 Acute respiratory failure with hypoxia: Secondary | ICD-10-CM | POA: Diagnosis not present

## 2020-10-27 LAB — POCT I-STAT, CHEM 8
BUN: 17 mg/dL (ref 8–23)
Calcium, Ion: 1.07 mmol/L — ABNORMAL LOW (ref 1.15–1.40)
Chloride: 108 mmol/L (ref 98–111)
Creatinine, Ser: 0.8 mg/dL (ref 0.61–1.24)
Glucose, Bld: 118 mg/dL — ABNORMAL HIGH (ref 70–99)
HCT: 26 % — ABNORMAL LOW (ref 39.0–52.0)
Hemoglobin: 8.8 g/dL — ABNORMAL LOW (ref 13.0–17.0)
Potassium: 3.5 mmol/L (ref 3.5–5.1)
Sodium: 142 mmol/L (ref 135–145)
TCO2: 23 mmol/L (ref 22–32)

## 2020-10-27 LAB — POCT I-STAT 7, (LYTES, BLD GAS, ICA,H+H)
Acid-base deficit: 2 mmol/L (ref 0.0–2.0)
Bicarbonate: 24.5 mmol/L (ref 20.0–28.0)
Calcium, Ion: 1.12 mmol/L — ABNORMAL LOW (ref 1.15–1.40)
HCT: 28 % — ABNORMAL LOW (ref 39.0–52.0)
Hemoglobin: 9.5 g/dL — ABNORMAL LOW (ref 13.0–17.0)
O2 Saturation: 96 %
Patient temperature: 36.3
Potassium: 3.9 mmol/L (ref 3.5–5.1)
Sodium: 143 mmol/L (ref 135–145)
TCO2: 26 mmol/L (ref 22–32)
pCO2 arterial: 49.8 mmHg — ABNORMAL HIGH (ref 32.0–48.0)
pH, Arterial: 7.297 — ABNORMAL LOW (ref 7.350–7.450)
pO2, Arterial: 87 mmHg (ref 83.0–108.0)

## 2020-10-27 LAB — CBC WITH DIFFERENTIAL/PLATELET
Abs Immature Granulocytes: 0.73 10*3/uL — ABNORMAL HIGH (ref 0.00–0.07)
Basophils Absolute: 0 10*3/uL (ref 0.0–0.1)
Basophils Relative: 0 %
Eosinophils Absolute: 0 10*3/uL (ref 0.0–0.5)
Eosinophils Relative: 0 %
HCT: 29.6 % — ABNORMAL LOW (ref 39.0–52.0)
Hemoglobin: 9 g/dL — ABNORMAL LOW (ref 13.0–17.0)
Immature Granulocytes: 8 %
Lymphocytes Relative: 14 %
Lymphs Abs: 1.3 10*3/uL (ref 0.7–4.0)
MCH: 25.8 pg — ABNORMAL LOW (ref 26.0–34.0)
MCHC: 30.4 g/dL (ref 30.0–36.0)
MCV: 84.8 fL (ref 80.0–100.0)
Monocytes Absolute: 0.6 10*3/uL (ref 0.1–1.0)
Monocytes Relative: 7 %
Neutro Abs: 6.7 10*3/uL (ref 1.7–7.7)
Neutrophils Relative %: 71 %
Platelets: 239 10*3/uL (ref 150–400)
RBC: 3.49 MIL/uL — ABNORMAL LOW (ref 4.22–5.81)
RDW: 16.1 % — ABNORMAL HIGH (ref 11.5–15.5)
WBC: 9.4 10*3/uL (ref 4.0–10.5)
nRBC: 1.1 % — ABNORMAL HIGH (ref 0.0–0.2)

## 2020-10-27 LAB — BASIC METABOLIC PANEL
Anion gap: 10 (ref 5–15)
BUN: 16 mg/dL (ref 8–23)
CO2: 23 mmol/L (ref 22–32)
Calcium: 7.8 mg/dL — ABNORMAL LOW (ref 8.9–10.3)
Chloride: 107 mmol/L (ref 98–111)
Creatinine, Ser: 0.98 mg/dL (ref 0.61–1.24)
GFR, Estimated: >60 mL/min (ref >60)
Glucose, Bld: 117 mg/dL — ABNORMAL HIGH (ref 70–99)
Potassium: 3.7 mmol/L (ref 3.5–5.1)
Sodium: 140 mmol/L (ref 135–145)

## 2020-10-27 LAB — GLUCOSE, CAPILLARY
Glucose-Capillary: 103 mg/dL — ABNORMAL HIGH (ref 70–99)
Glucose-Capillary: 108 mg/dL — ABNORMAL HIGH (ref 70–99)
Glucose-Capillary: 164 mg/dL — ABNORMAL HIGH (ref 70–99)
Glucose-Capillary: 92 mg/dL (ref 70–99)
Glucose-Capillary: 95 mg/dL (ref 70–99)

## 2020-10-27 LAB — MAGNESIUM: Magnesium: 2.2 mg/dL (ref 1.7–2.4)

## 2020-10-27 MED ORDER — POLYETHYLENE GLYCOL 3350 17 G PO PACK
17.0000 g | PACK | Freq: Every day | ORAL | Status: DC
  Administered 2020-10-27 – 2020-10-31 (×5): 17 g
  Filled 2020-10-27 (×5): qty 1

## 2020-10-27 MED ORDER — VITAL AF 1.2 CAL PO LIQD: 1000.0000 mL | ORAL | Status: DC

## 2020-10-27 MED ORDER — VALPROIC ACID 250 MG/5ML PO SOLN
250.0000 mg | Freq: Two times a day (BID) | ORAL | Status: DC
  Administered 2020-10-27 – 2020-11-15 (×39): 250 mg
  Filled 2020-10-27 (×40): qty 5

## 2020-10-27 MED ORDER — FUROSEMIDE 40 MG PO TABS
40.0000 mg | ORAL_TABLET | Freq: Once | ORAL | Status: AC
  Administered 2020-10-27: 40 mg
  Filled 2020-10-27: qty 1

## 2020-10-27 MED ORDER — LEVOTHYROXINE SODIUM 75 MCG PO TABS
75.0000 ug | ORAL_TABLET | Freq: Every day | ORAL | Status: DC
  Administered 2020-10-27 – 2020-11-15 (×19): 75 ug
  Filled 2020-10-27 (×2): qty 1
  Filled 2020-10-27: qty 3
  Filled 2020-10-27: qty 1
  Filled 2020-10-27 (×5): qty 3
  Filled 2020-10-27: qty 1
  Filled 2020-10-27 (×5): qty 3
  Filled 2020-10-27 (×2): qty 1
  Filled 2020-10-27 (×2): qty 3
  Filled 2020-10-27: qty 1

## 2020-10-27 MED ORDER — DOCUSATE SODIUM 50 MG/5ML PO LIQD: 100.0000 mg | Freq: Every day | ORAL | Status: DC

## 2020-10-27 MED ORDER — HEPARIN SODIUM (PORCINE) 5000 UNIT/ML IJ SOLN
5000.0000 [IU] | Freq: Three times a day (TID) | INTRAMUSCULAR | Status: DC
  Administered 2020-10-27 – 2020-11-15 (×59): 5000 [IU] via SUBCUTANEOUS
  Filled 2020-10-27 (×59): qty 1

## 2020-10-27 MED ORDER — VITAL AF 1.2 CAL PO LIQD
1000.0000 mL | ORAL | Status: DC
  Administered 2020-10-28 – 2020-11-02 (×7): 1000 mL
  Filled 2020-10-27 (×4): qty 1000

## 2020-10-27 MED ORDER — POLYETHYLENE GLYCOL 3350 17 G PO PACK: 17.0000 g | PACK | Freq: Every day | ORAL | Status: DC

## 2020-10-27 MED ORDER — PROSOURCE TF PO LIQD
45.0000 mL | Freq: Two times a day (BID) | ORAL | Status: DC
  Filled 2020-10-27: qty 45

## 2020-10-27 MED ORDER — SENNOSIDES-DOCUSATE SODIUM 8.6-50 MG PO TABS
2.0000 | ORAL_TABLET | Freq: Two times a day (BID) | ORAL | Status: AC
  Administered 2020-10-27 (×2): 2
  Filled 2020-10-27 (×2): qty 2

## 2020-10-27 MED ORDER — DEXMEDETOMIDINE HCL IN NACL 400 MCG/100ML IV SOLN
0.4000 ug/kg/h | INTRAVENOUS | Status: DC
  Administered 2020-10-27: 0.4 ug/kg/h via INTRAVENOUS
  Administered 2020-10-27: 1.5 ug/kg/h via INTRAVENOUS
  Administered 2020-10-28: 1.3 ug/kg/h via INTRAVENOUS
  Administered 2020-10-28: 1.4 ug/kg/h via INTRAVENOUS
  Administered 2020-10-28: 1 ug/kg/h via INTRAVENOUS
  Administered 2020-10-28: 1.4 ug/kg/h via INTRAVENOUS
  Filled 2020-10-27: qty 200
  Filled 2020-10-27 (×2): qty 100
  Filled 2020-10-27: qty 200
  Filled 2020-10-27 (×2): qty 100

## 2020-10-27 MED ORDER — FENTANYL CITRATE (PF) 100 MCG/2ML IJ SOLN
25.0000 ug | INTRAMUSCULAR | Status: DC | PRN
  Administered 2020-10-27: 50 ug via INTRAVENOUS
  Administered 2020-10-28: 100 ug via INTRAVENOUS
  Administered 2020-10-28: 50 ug via INTRAVENOUS
  Filled 2020-10-27 (×3): qty 2

## 2020-10-27 NOTE — Progress Notes (Signed)
eLink Physician-Brief Progress Note Patient Name: [name] DOB: Oct 11, 1956 MRN: [id number]   Date of Service  10/27/2020  HPI/Events of Note  Nursing request for AM lab orders.   eICU Interventions  Will order CBC with platelets, BMP and Mg++ level at 5 AM.     Intervention Category Major Interventions: Other:  Lenell Antu 10/27/2020, 3:32 AM

## 2020-10-27 NOTE — Progress Notes (Signed)
eLink Physician-Brief Progress Note Patient Name: [name] DOB: [birth year] MRN: [id number]   Date of Service  10/27/2020  HPI/Events of Note  Agitation / Ventilator asynchrony    eICU Interventions  Plan: 1. Fentanyl 25-100 mcg IV Q 1 hour PRN agitation. 2. Increase ceiling on Precedex IV infusion to 1.6 mcg/kg/min.     Intervention Category Major Interventions: Delirium, psychosis, severe agitation - evaluation and management;Respiratory failure - evaluation and management  Lenell Antu 10/27/2020, 7:57 PM

## 2020-10-27 NOTE — Progress Notes (Signed)
Wasted 150 mL of Fentanyl with Carollee Herter, RN.   Barbaraann Cao, RN

## 2020-10-27 NOTE — Progress Notes (Signed)
Pt placed on PSV 12/5 per wean protocol. Pt is tolerating well at this time. RN aware.

## 2020-10-27 NOTE — Progress Notes (Signed)
Decreased PS to 5 per Dr Celine Mans request. Pt is tolerating well at this time.

## 2020-10-27 NOTE — Progress Notes (Signed)
Assisted tele visit to patient with family member.  Brittiny Levitz P, RN  

## 2020-10-27 NOTE — Progress Notes (Addendum)
NAME:  [name], MRN:  [id number], DOB:  Apr 27, 1956, LOS: 6 ADMISSION DATE:  10/20/2020, CONSULTATION DATE:  10/21/2020 REFERRING MD: Dr. Loney Loh, CHIEF COMPLAINT:  Dyspnea  Brief History   64 year old male presented to ED after syncopal episode and was found to have large hiatal hernia compressing trachea.   Past Medical History  GERD, Gastroparesis, Barrett's esophagus, HLD, HTN, Iron deficiency anemia, Seizures, Schizophrenia, Developmental delay  Significant Hospital Events   10/29 admit, EGD done 10/30 transferred to ICU for respiratory distress, intubated.  Consults:  CVTS GI  Procedures:  ETT 10/30 >> Lt [place] CVL 10/30 >>  Robotic assisted hiatal hernia repair 11/3 Peg placement 11/3 Significant Diagnostic Tests:  CTA chest 10/29 > no PE, RUL ATX CT abd/pelvis 10/29  > large, stable gastric hernia with associated esophageal dilation and subsequent mass effect on the trachea.    Micro Data:  COVID 10/28 >> negative MRSA PCR 10/28 >> negative Blood 10/28 >> negative Sputum 10/30 >> negative  Antimicrobials:  Unasyn 10/28 >> 11/1  Interim history/subjective:  Patient tolerated surgery for hiatal hernia repair and peg placement yesterday. Continued on propofol and fentanyl for sedation. Awake this morning following commands.  Objective   Blood pressure 138/69, pulse 65, temperature 98.7 F (37.1 C), temperature source Oral, resp. rate 20, height (S) 5\' 4"  (1.626 m), weight 77.9 kg, SpO2 98 %.    Vent Mode: PRVC FiO2 (%):  [40 %] 40 % Set Rate:  [20 bmp] 20 bmp Vt Set:  [410 mL] 410 mL PEEP:  [5 cmH20] 5 cmH20 Plateau Pressure:  [20 cmH20] 20 cmH20   Intake/Output Summary (Last 24 hours) at 10/27/2020 0736 Last data filed at 10/27/2020 0600 Gross per 24 hour  Intake 2195.86 ml  Output 1575 ml  Net 620.86 ml   Filed Weights   10/20/20 2112 10/25/20 0259 10/27/20 0500  Weight: 77 kg 77.3 kg 77.9 kg   Examination: General: Awake resting  comfortably, no acute distress HENT: ETT inplace Lungs: clear to ascultation  Cardiovascular: normal S1 and S2, regular rate and rhythm, no  murmur Abdomen: Mildly distended, soft, non tender, + bs Extremities: warm, dry, no edema Neuro: Rass 0 following commands Skin: no rashes   Resolved Hospital Problem list   Circulatory shock  Assessment & Plan:   Acute hypoxic/hypercapnic respiratory failure 2nd to aspiration pneumonia. - Has received 4 days of antibiotics, given no focal infiltrate on chest x-ray, no leukocytosis on admission, suspect aspiration event without clinical pneumonia. Continues to be afebrile off antibiotics  - D/c propofol, add precedex as needed to titrate down fentanyl - wean off vent as tolerated  Large hiatal hernia. GERD, Barrett's esophagus, gastroparesis. - GI consulted - seen by Dr. 13/04/21 from thoracic surgery, EGD completed with esophageal mass ruled out - sp hiatal hernia repair and peg tube  - plan to extubate patient after hiatal hernia repair  Anemia of critical illness. - S/p 1 unit pRBC prior to surgery yesterday, Hgb 9 today - Continue to monitor CBCs - transfuse for Hb < 7 or significant bleeding  Acute metabolic encephalopathy 2nd to sepsis, hypoxia, hypercapnia.  Seizure disorder, Schizophrenia. - RASS goal 0 to -1 - D/c propofol, trig elevated   - Will try to decrease fentanyl today, precedex as needed to reduce sedation  - Continue home valproic acid 250 mg BID, quetiapine 400mg  BID,  Lamotrigine 100 mg daily  Constipation Mild abdominal distention on exam today. No BMs since 10/28.  -  Miralax 17g  - Senokot   Hypothyroidism. - Synthroid IV  Best practice:  Diet: Tube feeds through PEG DVT prophylaxis: lovenox GI prophylaxis: protonix Mobility: bed rest Code Status: full Disposition: ICU  Attending Attestation: POD 1 from hiatus repair. He was awake and following commands this morning but failed SBT this afternoon  for tachypnea and increased RR. He has a small right sided apical ptx. Discussed with surgery, will hold of on chest tube placement for now especially since he is likely to get extubated in the next 24 hours. Will wean to precedex and attempt extubation 11/5.   The patient is critically ill with multiple organ systems failure and requires high complexity decision making for assessment and support, frequent evaluation and titration of therapies, application of advanced monitoring technologies and extensive interpretation of multiple databases.   Critical Care Time devoted to patient care services described in this note is 34 minutes. This time reflects time of care of this signee Charlott HollerNikita S Mayuri Staples . This critical care time does not reflect separately billable procedures or procedure time, teaching time or supervisory time of PA/NP/Med student/Med Resident etc but could involve care discussion time.  Charlott Hollerikita S Namari Breton [facility] 10/27/2020 5:39 PM  Pager: [phone] After hours pager: [phone]     Labs   CBC: Recent Labs  Lab 10/21/20 0516 10/22/20 0346 10/23/20 0402 10/23/20 0402 10/24/20 0408 10/24/20 0408 10/25/20 0307 10/26/20 0330 10/26/20 1659 10/26/20 1827 10/27/20 0349  WBC 6.5   < > 18.0*  --  18.5*  --  17.7* 10.2  --   --  9.4  NEUTROABS 4.6  --   --   --   --   --   --   --   --   --  6.7  HGB 8.2*   < > 8.7*   < > 7.8*   < > 8.1* 7.2* 8.8* 9.5* 9.0*  HCT 28.1*   < > 29.4*   < > 25.6*   < > 26.8* 24.6* 26.0* 28.0* 29.6*  MCV 87.5   < > 86.7  --  84.5  --  85.4 84.5  --   --  84.8  PLT 231   < > 221  --  207  --  280 271  --   --  239   < > = values in this interval not displayed.    Basic Metabolic Panel: Recent Labs  Lab 10/22/20 0346 10/22/20 0620 10/23/20 0402 10/23/20 0402 10/24/20 0408 10/24/20 0408 10/24/20 1115 10/25/20 0307 10/25/20 1552 10/26/20 0330 10/26/20 1659 10/26/20 1827 10/27/20 0349  NA 139   < > 144    < > 141   < >  --  144  --  144 142 143 140  K 4.3   < > 4.2   < > 3.7   < >  --  3.4*  --  3.2* 3.5 3.9 3.7  CL 103   < > 108   < > 103  --   --  107  --  107 108  --  107  CO2 25   < > 26  --  28  --   --  27  --  26  --   --  23  GLUCOSE 116*   < > 130*   < > 146*  --   --  131*  --  84 118*  --  117*  BUN 18   < >  13   < > 12  --   --  28*  --  27* 17  --  16  CREATININE 1.18   < > 1.23   < > 1.10  --   --  1.26*  --  1.14 0.80  --  0.98  CALCIUM 8.2*   < > 7.9*  --  8.0*  --   --  8.0*  --  8.0*  --   --  7.8*  MG 2.1  --   --   --   --   --  2.3 2.7* 2.4  --   --   --  2.2  PHOS 3.5  --   --   --   --   --  3.4 3.6 2.6  --   --   --   --    < > = values in this interval not displayed.   GFR: Estimated Creatinine Clearance: 73.7 mL/min (by C-G formula based on SCr of 0.98 mg/dL). Recent Labs  Lab 10/20/20 2200 10/20/20 2228 10/20/20 2320 10/21/20 0516 10/24/20 0408 10/25/20 0307 10/26/20 0330 10/27/20 0349  PROCALCITON 0.11  --   --   --   --   --   --   --   WBC  --   --   --    < > 18.5* 17.7* 10.2 9.4  LATICACIDVEN  --  1.5 1.4  --   --   --   --   --    < > = values in this interval not displayed.    Liver Function Tests: Recent Labs  Lab 10/20/20 2200 10/21/20 0914 10/22/20 0346  AST 58*  --   --   ALT 44  --   --   ALKPHOS 57  --   --   BILITOT 0.4  --   --   PROT 6.2*  --   --   ALBUMIN 3.2* 2.6* 3.0*   No results for input(s): LIPASE, AMYLASE in the last 168 hours. No results for input(s): AMMONIA in the last 168 hours.  ABG    Component Value Date/Time   PHART 7.297 (L) 10/26/2020 1827   PCO2ART 49.8 (H) 10/26/2020 1827   PO2ART 87 10/26/2020 1827   HCO3 24.5 10/26/2020 1827   TCO2 26 10/26/2020 1827   ACIDBASEDEF 2.0 10/26/2020 1827   O2SAT 96.0 10/26/2020 1827     Coagulation Profile: No results for input(s): INR, PROTIME in the last 168 hours.  Cardiac Enzymes: No results for input(s): CKTOTAL, CKMB, CKMBINDEX, TROPONINI in the last 168  hours.  HbA1C: Hgb A1c MFr Bld  Date/Time Value Ref Range Status  10/22/2020 03:46 AM 5.8 (H) 4.8 - 5.6 % Final    Comment:    (NOTE) Pre diabetes:          5.7%-6.4%  Diabetes:              >6.4%  Glycemic control for   <7.0% adults with diabetes   10/05/2007 06:17 AM   Final   5.4 (NOTE)   The ADA recommends the following therapeutic goals for glycemic   control related to Hgb A1C measurement:   Goal of Therapy:   < 7.0% Hgb A1C   Action Suggested:  > 8.0% Hgb A1C   Ref:  Diabetes Care, 22, Suppl. 1, 1999    CBG: Recent Labs  Lab 10/26/20 1202 10/26/20 1510 10/26/20 2003 10/26/20 2313 10/27/20 0334  GLUCAP 74 84 118* 102* 92  Past Medical History  He,  has a past medical history of Barrett esophagus, Dysphagia, Gastroparesis, GERD (gastroesophageal reflux disease), Hypercholesteremia, Hypertension, Iron deficiency anemia, Mental retardation, Schizophrenia (HCC), and Seizure (HCC).   Surgical History    Past Surgical History:  Procedure Laterality Date  . BIOPSY  10/21/2020   Procedure: BIOPSY;  Surgeon: Iva Boop, MD;  Location: [facility] ENDOSCOPY;  Service: Endoscopy;;  . CRANIOTOMY FOR CYST FENESTRATION    . ESOPHAGOGASTRODUODENOSCOPY N/A 01/09/2015   Procedure: ESOPHAGOGASTRODUODENOSCOPY (EGD);  Surgeon: Louis Meckel, MD;  Location: [facility] ENDOSCOPY;  Service: Endoscopy;  Laterality: N/A;  . ESOPHAGOGASTRODUODENOSCOPY (EGD) WITH PROPOFOL N/A 10/21/2020   Procedure: ESOPHAGOGASTRODUODENOSCOPY (EGD) WITH PROPOFOL;  Surgeon: Iva Boop, MD;  Location: [facility] ENDOSCOPY;  Service: Endoscopy;  Laterality: N/A;     Social History   reports that he has never smoked. He has never used smokeless tobacco. He reports that he does not drink alcohol and does not use drugs.   Family History   His family history is not on file.   Allergies Allergies  Allergen Reactions  . Vancomycin Other (See Comments)    Unknown-possible hives      Home Medications  Prior to  Admission medications   Medication Sig Start Date End Date Taking? Authorizing Provider  benazepril (LOTENSIN) 20 MG tablet Take 20 mg by mouth daily.   Yes [provider]  Cholecalciferol (VITAMIN D) 2000 units tablet Take 2,000 Units by mouth daily.   Yes [provider]  divalproex (DEPAKOTE ER) 500 MG 24 hr tablet Take 500 mg by mouth daily.   Yes [provider]  esomeprazole (NEXIUM) 40 MG capsule Take 40 mg by mouth in the morning and at bedtime.   Yes [provider]  lamoTRIgine (LAMICTAL) 100 MG tablet Take 100 mg by mouth daily.   Yes [provider]  levothyroxine (SYNTHROID, LEVOTHROID) 75 MCG tablet Take 75 mcg by mouth daily.   Yes [provider]  metoCLOPramide (REGLAN) 5 MG tablet Take 1 tablet (5 mg total) by mouth 3 (three) times daily before meals. Patient taking differently: Take 5 mg by mouth in the morning and at bedtime.  07/27/15  Yes Regalado, Belkys A, MD  propranolol (INDERAL) 10 MG tablet Take 10 mg by mouth daily. 03/31/18  Yes [provider]  QUEtiapine (SEROQUEL) 400 MG tablet Take 400 mg by mouth 2 (two) times daily.    Yes [provider]  rosuvastatin (CRESTOR) 20 MG tablet Take 20 mg by mouth daily.   Yes [provider]     Quincy Simmonds, MD PGY-1 [facility] Internal Medicine 10/27/20 07:36 AM Pager [phone]

## 2020-10-27 NOTE — Progress Notes (Signed)
Placed pt back on full vent support due to increased WOB, and sats dropping to 84%. RN and MD at bedside.

## 2020-10-27 NOTE — Progress Notes (Signed)
Assisted tele visit to patient with family member.  [name] D Yesica Kemler, RN   

## 2020-10-27 NOTE — Progress Notes (Signed)
Nutrition Follow-up  DOCUMENTATION CODES:   Not applicable  INTERVENTION:   Initiate tube feeds via PEG: - Start Vital AF 1.2 @ 20 ml/hr and titrate by 10 ml q 6 hours to goal rate of 60 ml/hr (1440 ml/day)  Tube feeding regimen at goal rate provides 1728 kcal, 108 grams of protein, and 1168 ml of H2O.  NUTRITION DIAGNOSIS:   Inadequate oral intake related to acute illness as evidenced by NPO status.  Ongoing  GOAL:   Patient will meet greater than or equal to 90% of their needs  Progressing  MONITOR:   Vent status, Labs, Weight trends, TF tolerance  REASON FOR ASSESSMENT:   Ventilator, Consult Enteral/tube feeding initiation and management  ASSESSMENT:   64 year old male who presented to the ED on 10/28 from his group home after a syncopal episode. PMH of stroke with left-sided deficits and dysphagia, Barrett's esophagus, GERD, gastroparesis, HTN, HLD, iron deficiency anemia, intellectual disability, schizophrenia, seizure disorder, hypothyroidism. Pt was found to have a large hiatal hernia compressing his trachea along with gastric and esophageal distention.  10/29 - EGD showing 10 cm hiatal hernia, 2 small gastric polyps (biopsied), irregular Z-line with hiatal hernia deformity vs short segment Barrett's esophagus (biopsied) 10/30 - transferred to ICU for respiratory distress, intubated 11/01 - Cortrak placed, tip gastric per abdominal x-ray, TF later stopped due to emesis 11/03 - s/p hiatal hernia repair, PEG tube placement, Cortrak removed  Discussed pt with RN and during ICU rounds. PEG placed yesterday and cleared to use. RD consulted for tube feeding initiation and management. Per RN, pt failed SBT this AM and will likely remain intubated today. Pt is off propofol.  Patient is currently intubated on ventilator support MV: 8.3 L/min Temp (24hrs), Avg:98.6 F (37 C), Min:98.2 F (36.8 C), Max:99 F (37.2 C) BP (a-line): 133/57 MAP (a-line):  77  Drips: Propofol: off this AM Fentanyl  Medications reviewed and include: protonix, senna  Labs reviewed: hemoglobin 9.0, TG 254 CBG's: 74-118 x 24 hours  UOP: 1325 ml x 24 hours PEG output: 250 ml x 12 hours I/O's: +7.2 L since admit  Diet Order:   Diet Order            Diet NPO time specified  Diet effective now                 EDUCATION NEEDS:   No education needs have been identified at this time  Skin:  Skin Assessment: Skin Integrity Issues: Incisions: abdomen  Last BM:  10/23/20  Height:   Ht Readings from Last 1 Encounters:  10/22/20 (S) 5\' 4"  (1.626 m)    Weight:   Wt Readings from Last 1 Encounters:  10/27/20 77.9 kg    Ideal Body Weight:  59.1 kg  BMI:  Body mass index is 29.48 kg/m.  Estimated Nutritional Needs:   Kcal:  1700  Protein:  100-125 grams  Fluid:  >/= 1.7 L    13/04/21, MS, RD, LDN Inpatient Clinical Dietitian Please see AMiON for contact information.

## 2020-10-27 NOTE — Progress Notes (Addendum)
[facility] DAILY ICU PROGRESS NOTE                   [address]            [place] [address]          [phone]   1 Day Post-Op Procedure(s) (LRB): XI ROBOTIC ASSISTED HIATAL HERNIA REPAIR (N/A) ESOPHAGOGASTRODUODENOSCOPY (EGD) (N/A) PERCUTANEOUS ENDOSCOPIC GASTROSTOMY (PEG) PLACEMENT (N/A)  Total Length of Stay:  LOS: 6 days   Subjective: Patient able to follow some commands this am;has had previous stroke so with deficits on left side.  Objective: Vital signs in last 24 hours: Temp:  [98.5 F (36.9 C)-99 F (37.2 C)] 98.7 F (37.1 C) (11/04 0406) Pulse Rate:  [56-109] 107 (11/04 0756) Cardiac Rhythm: Sinus tachycardia;Normal sinus rhythm (11/04 0400) Resp:  [10-28] 28 (11/04 0756) BP: (96-177)/(59-92) 116/88 (11/04 0756) SpO2:  [89 %-100 %] 97 % (11/04 0756) Arterial Line BP: (101-182)/(55-84) 138/65 (11/04 0600) FiO2 (%):  [40 %] 40 % (11/04 0756) Weight:  [77.9 kg] 77.9 kg (11/04 0500)  Filed Weights   10/20/20 2112 10/25/20 0259 10/27/20 0500  Weight: 77 kg 77.3 kg 77.9 kg        Intake/Output from previous day: 11/03 0701 - 11/04 0700 In: 2195.9 [I.V.:1322.6; Blood:315; IV Piggyback:558.2] Out: 1779 [Urine:1325; Drains:250]  Intake/Output this shift: No intake/output data recorded.  Current Meds: Scheduled Meds: . chlorhexidine gluconate (MEDLINE KIT)  15 mL Mouth Rinse BID  . Chlorhexidine Gluconate Cloth  6 each Topical Daily  . feeding supplement (PROSource TF)  45 mL Per Tube BID  . heparin  5,000 Units Subcutaneous Q8H  . lamoTRIgine  100 mg Per Tube Daily  . levothyroxine  75 mcg Per Tube Q0600  . mouth rinse  15 mL Mouth Rinse 10 times per day  . pantoprazole sodium  40 mg Per Tube BID  . QUEtiapine  400 mg Per Tube BID  . rosuvastatin  20 mg Per Tube Daily  . senna-docusate  2 tablet Per Tube BID  . sodium chloride flush  10-40 mL Intracatheter Q12H   Continuous Infusions: . sodium chloride 375 mL/hr at 10/26/20 1911  .  feeding supplement (VITAL AF 1.2 CAL)    . fentaNYL infusion INTRAVENOUS 150 mcg/hr (10/27/20 0600)  . norepinephrine (LEVOPHED) Adult infusion Stopped (10/26/20 0000)  . propofol (DIPRIVAN) infusion 10 mcg/kg/min (10/27/20 0600)  . valproate sodium Stopped (10/27/20 0024)   PRN Meds:.acetaminophen, albuterol, fentaNYL, lip balm, ondansetron (ZOFRAN) IV  General appearance: no distress Heart: Slightly tachycardic Lungs: Slightly diminished bibasilar breath sounds Abdomen: Soft, sporadic bowel sounds, PEG tube in place Extremities: Trace LE edema Wound: Clean and dry Neurologic: Left sided deficits (previous stroke).   Lab Results: CBC: Recent Labs    10/26/20 0330 10/26/20 1659 10/26/20 1827 10/27/20 0349  WBC 10.2  --   --  9.4  HGB 7.2*   < > 9.5* 9.0*  HCT 24.6*   < > 28.0* 29.6*  PLT 271  --   --  239   < > = values in this interval not displayed.   BMET:  Recent Labs    10/26/20 0330 10/26/20 0330 10/26/20 1659 10/26/20 1659 10/26/20 1827 10/27/20 0349  NA 144   < > 142   < > 143 140  K 3.2*   < > 3.5   < > 3.9 3.7  CL 107   < > 108  --   --  107  CO2  26  --   --   --   --  23  GLUCOSE 84   < > 118*  --   --  117*  BUN 27*   < > 17  --   --  16  CREATININE 1.14   < > 0.80  --   --  0.98  CALCIUM 8.0*  --   --   --   --  7.8*   < > = values in this interval not displayed.    CMET: Lab Results  Component Value Date   WBC 9.4 10/27/2020   HGB 9.0 (L) 10/27/2020   HCT 29.6 (L) 10/27/2020   PLT 239 10/27/2020   GLUCOSE 117 (H) 10/27/2020   TRIG 254 (H) 10/26/2020   ALT 44 10/20/2020   AST 58 (H) 10/20/2020   NA 140 10/27/2020   K 3.7 10/27/2020   CL 107 10/27/2020   CREATININE 0.98 10/27/2020   BUN 16 10/27/2020   CO2 23 10/27/2020   TSH 3.119 10/20/2020   INR 1.2 09/05/2020   HGBA1C 5.8 (H) 10/22/2020     PT/INR: No results for input(s): LABPROT, INR in the last 72 hours. Radiology: No results found.   Assessment/Plan: S/P Procedure(s)  (LRB): XI ROBOTIC ASSISTED HIATAL HERNIA REPAIR (N/A) ESOPHAGOGASTRODUODENOSCOPY (EGD) (N/A) PERCUTANEOUS ENDOSCOPIC GASTROSTOMY (PEG) PLACEMENT (N/A)   1. CV-SR. 2. Pulmonary-CXR shows small, right apical pneumothorax and unchanged bibasilar airspace disease. On vent-management per CCM/Pulmonary 3. GI-NPO. TFs may be started per primary 4. CBGs 118/102/92. Tube feedings to be started at attending's discretion so continue for now. 5. Anemia of chronic disease-H and H this am stable at 9 and 29.6 6. History of seizure disorder and schizophrenia 7. May remove a line 8. History of hypothyroidism-on Levothyroxine via tube 9. History of seizures-on Valproic acid and Lamotrigine per tube  Nani Skillern PA-C 10/27/2020 8:05 AM   Agree with above. May start tube feeds today. Rest per primary.  Marche Hottenstein Bary Leriche

## 2020-10-28 ENCOUNTER — Inpatient Hospital Stay (HOSPITAL_COMMUNITY): Payer: Medicare Other

## 2020-10-28 DIAGNOSIS — M7989 Other specified soft tissue disorders: Secondary | ICD-10-CM | POA: Diagnosis not present

## 2020-10-28 DIAGNOSIS — R609 Edema, unspecified: Secondary | ICD-10-CM | POA: Diagnosis not present

## 2020-10-28 DIAGNOSIS — J9601 Acute respiratory failure with hypoxia: Secondary | ICD-10-CM | POA: Diagnosis not present

## 2020-10-28 LAB — BASIC METABOLIC PANEL
Anion gap: 8 (ref 5–15)
BUN: 15 mg/dL (ref 8–23)
CO2: 27 mmol/L (ref 22–32)
Calcium: 8.1 mg/dL — ABNORMAL LOW (ref 8.9–10.3)
Chloride: 103 mmol/L (ref 98–111)
Creatinine, Ser: 1.16 mg/dL (ref 0.61–1.24)
GFR, Estimated: >60 mL/min (ref >60)
Glucose, Bld: 162 mg/dL — ABNORMAL HIGH (ref 70–99)
Potassium: 3.7 mmol/L (ref 3.5–5.1)
Sodium: 138 mmol/L (ref 135–145)

## 2020-10-28 LAB — GLUCOSE, CAPILLARY
Glucose-Capillary: 123 mg/dL — ABNORMAL HIGH (ref 70–99)
Glucose-Capillary: 134 mg/dL — ABNORMAL HIGH (ref 70–99)
Glucose-Capillary: 144 mg/dL — ABNORMAL HIGH (ref 70–99)
Glucose-Capillary: 171 mg/dL — ABNORMAL HIGH (ref 70–99)
Glucose-Capillary: 179 mg/dL — ABNORMAL HIGH (ref 70–99)
Glucose-Capillary: 94 mg/dL (ref 70–99)

## 2020-10-28 MED ORDER — PROPOFOL 1000 MG/100ML IV EMUL
INTRAVENOUS | Status: AC
  Filled 2020-10-28: qty 100

## 2020-10-28 MED ORDER — FENTANYL CITRATE (PF) 100 MCG/2ML IJ SOLN
25.0000 ug | INTRAMUSCULAR | Status: DC | PRN
  Administered 2020-10-28 (×5): 50 ug via INTRAVENOUS
  Administered 2020-10-29 (×2): 25 ug via INTRAVENOUS
  Administered 2020-10-30: 50 ug via INTRAVENOUS
  Administered 2020-10-30 – 2020-10-31 (×3): 25 ug via INTRAVENOUS
  Administered 2020-11-01 – 2020-11-04 (×5): 50 ug via INTRAVENOUS
  Filled 2020-10-28 (×5): qty 2

## 2020-10-28 MED ORDER — POTASSIUM CHLORIDE 20 MEQ PO PACK
40.0000 meq | PACK | Freq: Once | ORAL | Status: AC
  Administered 2020-10-28: 40 meq
  Filled 2020-10-28: qty 2

## 2020-10-28 MED ORDER — LACTATED RINGERS IV BOLUS
500.0000 mL | Freq: Once | INTRAVENOUS | Status: AC
  Administered 2020-10-28: 500 mL via INTRAVENOUS

## 2020-10-28 MED ORDER — FENTANYL 2500MCG IN NS 250ML (10MCG/ML) PREMIX INFUSION
0.0000 ug/h | INTRAVENOUS | Status: DC
  Administered 2020-10-28: 25 ug/h via INTRAVENOUS
  Administered 2020-10-30 – 2020-10-31 (×2): 100 ug/h via INTRAVENOUS
  Administered 2020-11-01: 75 ug/h via INTRAVENOUS
  Administered 2020-11-02: 250 ug/h via INTRAVENOUS
  Administered 2020-11-02: 200 ug/h via INTRAVENOUS
  Administered 2020-11-03: 150 ug/h via INTRAVENOUS
  Administered 2020-11-03: 200 ug/h via INTRAVENOUS
  Filled 2020-10-28 (×8): qty 250

## 2020-10-28 MED ORDER — FUROSEMIDE 10 MG/ML IJ SOLN
40.0000 mg | Freq: Once | INTRAMUSCULAR | Status: AC
  Administered 2020-10-28: 40 mg via INTRAVENOUS
  Filled 2020-10-28: qty 4

## 2020-10-28 MED ORDER — INSULIN ASPART 100 UNIT/ML ~~LOC~~ SOLN
0.0000 [IU] | SUBCUTANEOUS | Status: DC
  Administered 2020-10-28: 3 [IU] via SUBCUTANEOUS
  Administered 2020-10-28 (×2): 2 [IU] via SUBCUTANEOUS
  Administered 2020-10-28: 3 [IU] via SUBCUTANEOUS
  Administered 2020-10-29 (×3): 2 [IU] via SUBCUTANEOUS
  Administered 2020-10-29: 3 [IU] via SUBCUTANEOUS
  Administered 2020-10-29: 2 [IU] via SUBCUTANEOUS
  Administered 2020-10-30: 3 [IU] via SUBCUTANEOUS
  Administered 2020-10-30 (×5): 2 [IU] via SUBCUTANEOUS
  Administered 2020-10-31: 3 [IU] via SUBCUTANEOUS
  Administered 2020-10-31: 2 [IU] via SUBCUTANEOUS
  Administered 2020-10-31: 3 [IU] via SUBCUTANEOUS
  Administered 2020-10-31 – 2020-11-01 (×2): 2 [IU] via SUBCUTANEOUS
  Administered 2020-11-01 (×2): 3 [IU] via SUBCUTANEOUS
  Administered 2020-11-01: 2 [IU] via SUBCUTANEOUS
  Administered 2020-11-01 (×2): 3 [IU] via SUBCUTANEOUS
  Administered 2020-11-02: 2 [IU] via SUBCUTANEOUS
  Administered 2020-11-02: 3 [IU] via SUBCUTANEOUS
  Administered 2020-11-02: 5 [IU] via SUBCUTANEOUS
  Administered 2020-11-02 (×2): 3 [IU] via SUBCUTANEOUS
  Administered 2020-11-03 – 2020-11-07 (×13): 2 [IU] via SUBCUTANEOUS
  Administered 2020-11-08: 3 [IU] via SUBCUTANEOUS
  Administered 2020-11-08 – 2020-11-09 (×4): 2 [IU] via SUBCUTANEOUS
  Administered 2020-11-09: 3 [IU] via SUBCUTANEOUS
  Administered 2020-11-09 – 2020-11-10 (×5): 2 [IU] via SUBCUTANEOUS
  Administered 2020-11-10 – 2020-11-11 (×2): 3 [IU] via SUBCUTANEOUS
  Administered 2020-11-11 – 2020-11-12 (×4): 2 [IU] via SUBCUTANEOUS
  Administered 2020-11-12 (×5): 3 [IU] via SUBCUTANEOUS
  Administered 2020-11-13: 5 [IU] via SUBCUTANEOUS
  Administered 2020-11-13 (×4): 3 [IU] via SUBCUTANEOUS
  Administered 2020-11-13: 2 [IU] via SUBCUTANEOUS
  Administered 2020-11-14 (×4): 3 [IU] via SUBCUTANEOUS
  Administered 2020-11-14 (×2): 2 [IU] via SUBCUTANEOUS
  Administered 2020-11-15 (×3): 3 [IU] via SUBCUTANEOUS
  Administered 2020-11-15: 2 [IU] via SUBCUTANEOUS

## 2020-10-28 MED ORDER — CHLORHEXIDINE GLUCONATE CLOTH 2 % EX PADS
6.0000 | MEDICATED_PAD | Freq: Every day | CUTANEOUS | Status: DC
  Administered 2020-10-28 – 2020-11-02 (×5): 6 via TOPICAL

## 2020-10-28 MED ORDER — PROPOFOL 1000 MG/100ML IV EMUL
5.0000 ug/kg/min | INTRAVENOUS | Status: DC
  Administered 2020-10-28: 20 ug/kg/min via INTRAVENOUS
  Administered 2020-10-29: 35 ug/kg/min via INTRAVENOUS
  Administered 2020-10-29 – 2020-10-30 (×4): 30 ug/kg/min via INTRAVENOUS
  Administered 2020-10-30 – 2020-10-31 (×3): 40 ug/kg/min via INTRAVENOUS
  Administered 2020-10-31: 15 ug/kg/min via INTRAVENOUS
  Administered 2020-10-31: 30 ug/kg/min via INTRAVENOUS
  Administered 2020-11-01: 20 ug/kg/min via INTRAVENOUS
  Filled 2020-10-28 (×13): qty 100

## 2020-10-28 MED ORDER — BISACODYL 10 MG RE SUPP
10.0000 mg | Freq: Once | RECTAL | Status: AC
  Administered 2020-10-28: 10 mg via RECTAL
  Filled 2020-10-28: qty 1

## 2020-10-28 MED ORDER — DOCUSATE SODIUM 50 MG/5ML PO LIQD
100.0000 mg | Freq: Two times a day (BID) | ORAL | Status: DC
  Administered 2020-10-28 – 2020-11-15 (×36): 100 mg
  Filled 2020-10-28 (×37): qty 10

## 2020-10-28 MED ORDER — DOCUSATE SODIUM 50 MG/5ML PO LIQD: 100.0000 mg | Freq: Two times a day (BID) | ORAL | Status: DC

## 2020-10-28 MED ORDER — CHLORHEXIDINE GLUCONATE CLOTH 2 % EX PADS: 6.0000 | MEDICATED_PAD | Freq: Every day | CUTANEOUS | Status: DC

## 2020-10-28 NOTE — Discharge Instructions (Signed)
Discharge Instructions:  1. You may shower, please wash incisions daily with soap and water and keep dry.  If you wish to cover wounds with dressing you may do so but please keep clean and change daily.  No tub baths or swimming until incisions have completely healed.  If your incisions become red or develop any drainage please call our office at [phone]  2. No Driving until cleared by surgeon office and you are no longer using narcotic pain medications  3. Fever of 101.5 for at least 24 hours with no source, please contact our office at [phone]  5. Activity- up as tolerated, please walk at least 3 times per day.  Avoid strenuous activity, no lifting, pushing, or pulling with your arms over 8-10 lbs for a minimum of 2 weeks  6. If any questions or concerns arise, please do not hesitate to contact our office at [phone]

## 2020-10-28 NOTE — Progress Notes (Signed)
[facility] notified of systolic blood pressure less than 90. Awaiting orders.

## 2020-10-28 NOTE — Progress Notes (Addendum)
NAME:  Gerald LimerickRoosevelt P [name], MRN:  [id number], DOB:  1956/11/12, LOS: 7 ADMISSION DATE:  10/20/2020, CONSULTATION DATE:  10/21/2020 REFERRING MD: Dr. Loney Lohathore, CHIEF COMPLAINT:  Dyspnea  Brief History   64 year old male presented to ED after syncopal episode and was found to have large hiatal hernia compressing trachea.   Past Medical History  GERD, Gastroparesis, Barrett's esophagus, HLD, HTN, Iron deficiency anemia, Seizures, Schizophrenia, Developmental delay  Significant Hospital Events   10/29 admit, EGD done 10/30 transferred to ICU for respiratory distress, intubated.  Consults:  CVTS GI  Procedures:  ETT 10/30 >> Lt [place] CVL 10/30 >>  Robotic assisted hiatal hernia repair 11/3 Peg placement 11/3 Significant Diagnostic Tests:  CTA chest 10/29 > no PE, RUL ATX CT abd/pelvis 10/29  > large, stable gastric hernia with associated esophageal dilation and subsequent mass effect on the trachea.    Micro Data:  COVID 10/28 >> negative MRSA PCR 10/28 >> negative Blood 10/28 >> negative Sputum 10/30 >> negative  Antimicrobials:  Unasyn 10/28 >> 11/1  Interim history/subjective:  Overnight patient with several episode of agitation required 3 boluses of fentanyl. This morning patient become very agitated during SBT, was able to be verbally calmed after placing back on previous vent settings.  Objective   Blood pressure 124/72, pulse (!) 55, temperature (!) 97.5 F (36.4 C), temperature source Axillary, resp. rate 20, height 5\' 4"  (1.626 m), weight 80 kg, SpO2 98 %.    Vent Mode: PRVC FiO2 (%):  [40 %] 40 % Set Rate:  [20 bmp] 20 bmp Vt Set:  [410 mL] 410 mL PEEP:  [5 cmH20] 5 cmH20 Pressure Support:  [5 cmH20-12 cmH20] 5 cmH20 Plateau Pressure:  [18 cmH20-23 cmH20] 23 cmH20   Intake/Output Summary (Last 24 hours) at 10/28/2020 0803 Last data filed at 10/28/2020 0700 Gross per 24 hour  Intake 1173.38 ml  Output 1240 ml  Net -66.62 ml   Filed Weights   10/25/20 0259  10/27/20 0500 10/28/20 0500  Weight: 77.3 kg 77.9 kg 80 kg   Examination: General: Resting comfortably, no acute distress HENT: ETT inplace Lungs: clear to ascultation  Cardiovascular: normal S1 and S2, regular rate and rhythm, no  murmur Abdomen: Distended, Peg tube in place, soft, non tender, + bs Extremities: warm, dry, 2+ edema bilaterally Neuro: Rass -1, more lethargic today  Skin: surgical incisions clean and dry   Resolved Hospital Problem list   Circulatory shock  Assessment & Plan:   Acute hypoxic/hypercapnic respiratory failure 2nd to aspiration pneumonia. - Has received 4 days of antibiotics, given no focal infiltrate on chest x-ray, no leukocytosis on admission, suspect aspiration event without clinical pneumonia. Continues to be afebrile off antibiotics  - Currently on 1 mcg of Precedex for sedation and tritrate down as tolerated - Lasix 40 mg IV - Will place on PS and attempt extubation today  Pneumothorax Small 2 cm right apical pneumothorax on CXR new from 11/4. Imaging today showing stable size. Discussed with CT surgery. -Will hold off on chest tube with plan to attempt to extubate today  Large hiatal hernia. GERD, Barrett's esophagus, gastroparesis. - GI consulted - seen by Dr. Cliffton AstersLightfoot from thoracic surgery, EGD completed with esophageal mass ruled out - sp hiatal hernia repair and peg tube on 11/3, tolerated well without complications  Anemia of critical illness. - S/p 1 unit pRBC prior to surgery yesterday - Continue to monitor CBCs - transfuse for Hb < 7 or significant bleeding  Acute metabolic  encephalopathy 2nd to sepsis, hypoxia, hypercapnia.  Seizure disorder, Schizophrenia. - RASS goal 0 to -1 - Required 3 boluses of fentanyl overnight for agitation - Will continue on precedex for agitation  - Continue home valproic acid 250 mg BID, quetiapine 400mg  BID,  Lamotrigine 100 mg daily  Constipation Continues to be distented on exam today. No  BMs since 10/30.  - Continue on bowel regimin Miralax 17g, docusate - Bisacodyl suppository today  Hypothyroidism. - Synthroid IV  Hyperglycemia Started on tube feeds yesterday, currently at rate of 40 ml/hr, tolerating well -SSI   Best practice:  Diet: Tube feeds 54mL/hr  DVT prophylaxis: lovenox GI prophylaxis: protonix Mobility: bed rest Code Status: full Disposition: ICU  Labs   CBC: Recent Labs  Lab 10/23/20 0402 10/23/20 0402 10/24/20 0408 10/24/20 0408 10/25/20 0307 10/26/20 0330 10/26/20 1659 10/26/20 1827 10/27/20 0349  WBC 18.0*  --  18.5*  --  17.7* 10.2  --   --  9.4  NEUTROABS  --   --   --   --   --   --   --   --  6.7  HGB 8.7*   < > 7.8*   < > 8.1* 7.2* 8.8* 9.5* 9.0*  HCT 29.4*   < > 25.6*   < > 26.8* 24.6* 26.0* 28.0* 29.6*  MCV 86.7  --  84.5  --  85.4 84.5  --   --  84.8  PLT 221  --  207  --  280 271  --   --  239   < > = values in this interval not displayed.    Basic Metabolic Panel: Recent Labs  Lab 10/22/20 0346 10/22/20 0620 10/24/20 0408 10/24/20 0408 10/24/20 1115 10/25/20 0307 10/25/20 0307 10/25/20 1552 10/26/20 0330 10/26/20 1659 10/26/20 1827 10/27/20 0349 10/28/20 0443  NA 139   < > 141   < >  --  144   < >  --  144 142 143 140 138  K 4.3   < > 3.7   < >  --  3.4*   < >  --  3.2* 3.5 3.9 3.7 3.7  CL 103   < > 103   < >  --  107  --   --  107 108  --  107 103  CO2 25   < > 28  --   --  27  --   --  26  --   --  23 27  GLUCOSE 116*   < > 146*   < >  --  131*  --   --  84 118*  --  117* 162*  BUN 18   < > 12   < >  --  28*  --   --  27* 17  --  16 15  CREATININE 1.18   < > 1.10   < >  --  1.26*  --   --  1.14 0.80  --  0.98 1.16  CALCIUM 8.2*   < > 8.0*  --   --  8.0*  --   --  8.0*  --   --  7.8* 8.1*  MG 2.1  --   --   --  2.3 2.7*  --  2.4  --   --   --  2.2  --   PHOS 3.5  --   --   --  3.4 3.6  --  2.6  --   --   --   --   --    < > =  values in this interval not displayed.   GFR: Estimated Creatinine Clearance:  63 mL/min (by C-G formula based on SCr of 1.16 mg/dL). Recent Labs  Lab 10/24/20 0408 10/25/20 0307 10/26/20 0330 10/27/20 0349  WBC 18.5* 17.7* 10.2 9.4    Liver Function Tests: Recent Labs  Lab 10/21/20 0914 10/22/20 0346  ALBUMIN 2.6* 3.0*   No results for input(s): LIPASE, AMYLASE in the last 168 hours. No results for input(s): AMMONIA in the last 168 hours.  ABG    Component Value Date/Time   PHART 7.297 (L) 10/26/2020 1827   PCO2ART 49.8 (H) 10/26/2020 1827   PO2ART 87 10/26/2020 1827   HCO3 24.5 10/26/2020 1827   TCO2 26 10/26/2020 1827   ACIDBASEDEF 2.0 10/26/2020 1827   O2SAT 96.0 10/26/2020 1827     Coagulation Profile: No results for input(s): INR, PROTIME in the last 168 hours.  Cardiac Enzymes: No results for input(s): CKTOTAL, CKMB, CKMBINDEX, TROPONINI in the last 168 hours.  HbA1C: Hgb A1c MFr Bld  Date/Time Value Ref Range Status  10/22/2020 03:46 AM 5.8 (H) 4.8 - 5.6 % Final    Comment:    (NOTE) Pre diabetes:          5.7%-6.4%  Diabetes:              >6.4%  Glycemic control for   <7.0% adults with diabetes   10/05/2007 06:17 AM   Final   5.4 (NOTE)   The ADA recommends the following therapeutic goals for glycemic   control related to Hgb A1C measurement:   Goal of Therapy:   < 7.0% Hgb A1C   Action Suggested:  > 8.0% Hgb A1C   Ref:  Diabetes Care, 22, Suppl. 1, 1999    CBG: Recent Labs  Lab 10/27/20 1532 10/27/20 1922 10/27/20 2341 10/28/20 0317 10/28/20 0757  GLUCAP 95 108* 164* 134* 171*    Past Medical History  He,  has a past medical history of Barrett esophagus, Dysphagia, Gastroparesis, GERD (gastroesophageal reflux disease), Hypercholesteremia, Hypertension, Iron deficiency anemia, Mental retardation, Schizophrenia (HCC), and Seizure (HCC).   Surgical History    Past Surgical History:  Procedure Laterality Date  . BIOPSY  10/21/2020   Procedure: BIOPSY;  Surgeon: Iva Boop, MD;  Location: [facility] ENDOSCOPY;   Service: Endoscopy;;  . CRANIOTOMY FOR CYST FENESTRATION    . ESOPHAGOGASTRODUODENOSCOPY N/A 01/09/2015   Procedure: ESOPHAGOGASTRODUODENOSCOPY (EGD);  Surgeon: Louis Meckel, MD;  Location: [facility] ENDOSCOPY;  Service: Endoscopy;  Laterality: N/A;  . ESOPHAGOGASTRODUODENOSCOPY N/A 10/26/2020   Procedure: ESOPHAGOGASTRODUODENOSCOPY (EGD);  Surgeon: Corliss Skains, MD;  Location: [facility] OR;  Service: Thoracic;  Laterality: N/A;  . ESOPHAGOGASTRODUODENOSCOPY (EGD) WITH PROPOFOL N/A 10/21/2020   Procedure: ESOPHAGOGASTRODUODENOSCOPY (EGD) WITH PROPOFOL;  Surgeon: Iva Boop, MD;  Location: [facility] ENDOSCOPY;  Service: Endoscopy;  Laterality: N/A;  . PEG PLACEMENT N/A 10/26/2020   Procedure: PERCUTANEOUS ENDOSCOPIC GASTROSTOMY (PEG) PLACEMENT;  Surgeon: Corliss Skains, MD;  Location: [facility] OR;  Service: Thoracic;  Laterality: N/A;  . XI ROBOTIC ASSISTED HIATAL HERNIA REPAIR N/A 10/26/2020   Procedure: XI ROBOTIC ASSISTED HIATAL HERNIA REPAIR;  Surgeon: Corliss Skains, MD;  Location: [facility] OR;  Service: Thoracic;  Laterality: N/A;     Social History   reports that he has never smoked. He has never used smokeless tobacco. He reports that he does not drink alcohol and does not use drugs.   Family History   His family history is not on file.   Allergies  Allergies  Allergen Reactions  . Vancomycin Other (See Comments)    Unknown-possible hives      Home Medications  Prior to Admission medications   Medication Sig Start Date End Date Taking? Authorizing Provider  benazepril (LOTENSIN) 20 MG tablet Take 20 mg by mouth daily.   Yes [provider]  Cholecalciferol (VITAMIN D) 2000 units tablet Take 2,000 Units by mouth daily.   Yes [provider]  divalproex (DEPAKOTE ER) 500 MG 24 hr tablet Take 500 mg by mouth daily.   Yes [provider]  esomeprazole (NEXIUM) 40 MG capsule Take 40 mg by mouth in the morning and at bedtime.   Yes [provider]    lamoTRIgine (LAMICTAL) 100 MG tablet Take 100 mg by mouth daily.   Yes [provider]  levothyroxine (SYNTHROID, LEVOTHROID) 75 MCG tablet Take 75 mcg by mouth daily.   Yes [provider]  metoCLOPramide (REGLAN) 5 MG tablet Take 1 tablet (5 mg total) by mouth 3 (three) times daily before meals. Patient taking differently: Take 5 mg by mouth in the morning and at bedtime.  07/27/15  Yes Regalado, Belkys A, MD  propranolol (INDERAL) 10 MG tablet Take 10 mg by mouth daily. 03/31/18  Yes [provider]  QUEtiapine (SEROQUEL) 400 MG tablet Take 400 mg by mouth 2 (two) times daily.    Yes [provider]  rosuvastatin (CRESTOR) 20 MG tablet Take 20 mg by mouth daily.   Yes [provider]     Quincy Simmonds, MD PGY-1 [facility] Internal Medicine 10/28/20 08:03 AM Pager [phone]

## 2020-10-28 NOTE — Progress Notes (Addendum)
TCTS DAILY ICU PROGRESS NOTE                   [address]            [place] [address]          [phone]   2 Days Post-Op Procedure(s) (LRB): XI ROBOTIC ASSISTED HIATAL HERNIA REPAIR (N/A) ESOPHAGOGASTRODUODENOSCOPY (EGD) (N/A) PERCUTANEOUS ENDOSCOPIC GASTROSTOMY (PEG) PLACEMENT (N/A)  Total Length of Stay:  LOS: 7 days   Subjective: Patient sedated this am, remains on vent  Objective: Vital signs in last 24 hours: Temp:  [98.2 F (36.8 C)-99.1 F (37.3 C)] 98.3 F (36.8 C) (11/05 0340) Pulse Rate:  [47-107] 55 (11/05 0700) Cardiac Rhythm: Sinus bradycardia (11/05 0400) Resp:  [20-46] 20 (11/05 0700) BP: (79-179)/(56-98) 124/72 (11/05 0700) SpO2:  [86 %-100 %] 98 % (11/05 0700) Arterial Line BP: (93-140)/(57-75) 140/60 (11/04 1200) FiO2 (%):  [40 %] 40 % (11/05 0323) Weight:  [80 kg] 80 kg (11/05 0500)  Filed Weights   10/25/20 0259 10/27/20 0500 10/28/20 0500  Weight: 77.3 kg 77.9 kg 80 kg        Intake/Output from previous day: 11/04 0701 - 11/05 0700 In: 1108.8 [I.V.:528.8; NG/GT:580] Out: 1290 [Urine:1170; Drains:120]  Intake/Output this shift: No intake/output data recorded.  Current Meds: Scheduled Meds: . chlorhexidine gluconate (MEDLINE KIT)  15 mL Mouth Rinse BID  . Chlorhexidine Gluconate Cloth  6 each Topical Daily  . heparin  5,000 Units Subcutaneous Q8H  . lamoTRIgine  100 mg Per Tube Daily  . levothyroxine  75 mcg Per Tube Q0600  . mouth rinse  15 mL Mouth Rinse 10 times per day  . pantoprazole sodium  40 mg Per Tube BID  . polyethylene glycol  17 g Per Tube Daily  . QUEtiapine  400 mg Per Tube BID  . rosuvastatin  20 mg Per Tube Daily  . sodium chloride flush  10-40 mL Intracatheter Q12H  . valproic acid  250 mg Per Tube BID   Continuous Infusions: . sodium chloride 375 mL/hr at 10/26/20 1911  . dexmedetomidine (PRECEDEX) IV infusion 1.2 mcg/kg/hr (10/28/20 0600)  . feeding supplement (VITAL AF 1.2 CAL) 40 mL/hr  at 10/28/20 0000  . fentaNYL infusion INTRAVENOUS Stopped (10/27/20 1558)   PRN Meds:.acetaminophen, albuterol, fentaNYL (SUBLIMAZE) injection, lip balm, ondansetron (ZOFRAN) IV   Heart: Slightly bradycardic Lungs: Slightly diminished bibasilar breath sounds Abdomen: Soft, protuberant, rare bowel sounds this am, PEG tube in place Extremities: No LE edema Wound: Clean and dry   Lab Results: CBC: Recent Labs    10/26/20 0330 10/26/20 1659 10/26/20 1827 10/27/20 0349  WBC 10.2  --   --  9.4  HGB 7.2*   < > 9.5* 9.0*  HCT 24.6*   < > 28.0* 29.6*  PLT 271  --   --  239   < > = values in this interval not displayed.   BMET:  Recent Labs    10/27/20 0349 10/28/20 0443  NA 140 138  K 3.7 3.7  CL 107 103  CO2 23 27  GLUCOSE 117* 162*  BUN 16 15  CREATININE 0.98 1.16  CALCIUM 7.8* 8.1*    CMET: Lab Results  Component Value Date   WBC 9.4 10/27/2020   HGB 9.0 (L) 10/27/2020   HCT 29.6 (L) 10/27/2020   PLT 239 10/27/2020   GLUCOSE 162 (H) 10/28/2020   TRIG 254 (H) 10/26/2020   ALT 44 10/20/2020   AST 58 (  H) 10/20/2020   NA 138 10/28/2020   K 3.7 10/28/2020   CL 103 10/28/2020   CREATININE 1.16 10/28/2020   BUN 15 10/28/2020   CO2 27 10/28/2020   TSH 3.119 10/20/2020   INR 1.2 09/05/2020   HGBA1C 5.8 (H) 10/22/2020     PT/INR: No results for input(s): LABPROT, INR in the last 72 hours. Radiology: DG CHEST PORT 1 VIEW  Result Date: 10/28/2020 CLINICAL DATA:  Pneumothorax follow-up EXAM: PORTABLE CHEST 1 VIEW COMPARISON:  Yesterday FINDINGS: Endotracheal tube with tip just above the clavicular heads, unchanged. Left subclavian line with tip at the left brachiocephalic/SVC junction.Right apical pneumothorax measuring 18 mm craniocaudal thickness. Low volume chest with hazy opacity on both sides. Stable heart size distorted by rightward rotation. IMPRESSION: 1. Unchanged small right apical pneumothorax. 2. Low volume chest with progressive volume loss. Electronically  Signed   By: Monte Fantasia M.D.   On: 10/28/2020 05:10   DG CHEST PORT 1 VIEW  Result Date: 10/27/2020 CLINICAL DATA:  Pneumothorax EXAM: PORTABLE CHEST 1 VIEW COMPARISON:  10/24/2020 FINDINGS: Right apical pneumothorax approximately 2 cm. This has developed since the prior study. Endotracheal tube in good position. Left subclavian central venous catheter tip in the mid SVC unchanged. Bibasilar airspace disease similar to the prior study. Possible small effusions. IMPRESSION: Interval development of 2 cm right apical pneumothorax. Bibasilar airspace disease unchanged Electronically Signed   By: Franchot Gallo M.D.   On: 10/27/2020 09:06     Assessment/Plan: S/P Procedure(s) (LRB): XI ROBOTIC ASSISTED HIATAL HERNIA REPAIR (N/A) ESOPHAGOGASTRODUODENOSCOPY (EGD) (N/A) PERCUTANEOUS ENDOSCOPIC GASTROSTOMY (PEG) PLACEMENT (N/A)   1. CV-SB/SR 2. Pulmonary-On vent-management per CCM/PulmonaryCXR shows low lung volumes and small, stable right apical pneumothorax .  3. GI-NPO. TFs started per primary 4. CBGs 108/164/134. Tube feedings to be started at attending's discretion so continue for now. 5. Anemia of chronic disease-H and H this am stable at 9 and 29.6 6. History of seizure disorder and schizophrenia 7. History of hypothyroidism-on Levothyroxine via tube 8. History of seizures-on Valproic acid and Lamotrigine per tube  Donielle Liston Alba PA-C 10/28/2020 7:21 AM    Agree with above Tolerating tube feeds Awaiting extubation Will need speech therapy consult and swallow eval prior to initiating oral feeds once extubated.  Will follow peripherally for now.  Hayward Rylander Bary Leriche

## 2020-10-28 NOTE — Progress Notes (Signed)
eLink Physician-Brief Progress Note Patient Name: [name] DOB: 10-Apr-1956 MRN: [id number]   Date of Service  10/28/2020  HPI/Events of Note  BP 77/52  eICU Interventions  LR 500 ml iv fluid bolus x 1 ordered        Caulder Wehner U Adil Tugwell 10/28/2020, 11:12 PM

## 2020-10-28 NOTE — Progress Notes (Signed)
eLink Physician-Brief Progress Note Patient Name: [name] DOB: [birth year] MRN: [id number]   Date of Service  10/28/2020  HPI/Events of Note  Inadequate sedation, patient is wide awake and at risk of self-extubation. Precedex is at 1.6 mcg.  eICU Interventions  Discontinue Precedex, Propofol + Fentanyl infusions ordered.        Thomasene Lot Skila Rollins 10/28/2020, 9:12 PM

## 2020-10-29 ENCOUNTER — Inpatient Hospital Stay (HOSPITAL_COMMUNITY): Payer: Medicare Other

## 2020-10-29 DIAGNOSIS — J9601 Acute respiratory failure with hypoxia: Secondary | ICD-10-CM | POA: Diagnosis not present

## 2020-10-29 LAB — BPAM RBC
Blood Product Expiration Date: 202112042359
Blood Product Expiration Date: 202112042359
Blood Product Expiration Date: 202112042359
Blood Product Expiration Date: 202112042359
ISSUE DATE / TIME: 202111031524
ISSUE DATE / TIME: 202111031524
Unit Type and Rh: 5100
Unit Type and Rh: 5100
Unit Type and Rh: 5100
Unit Type and Rh: 5100

## 2020-10-29 LAB — TYPE AND SCREEN
ABO/RH(D): O POS
Antibody Screen: NEGATIVE
Unit division: 0
Unit division: 0
Unit division: 0
Unit division: 0

## 2020-10-29 LAB — CBC
HCT: 27.1 % — ABNORMAL LOW (ref 39.0–52.0)
Hemoglobin: 8.2 g/dL — ABNORMAL LOW (ref 13.0–17.0)
MCH: 25.9 pg — ABNORMAL LOW (ref 26.0–34.0)
MCHC: 30.3 g/dL (ref 30.0–36.0)
MCV: 85.8 fL (ref 80.0–100.0)
Platelets: 261 10*3/uL (ref 150–400)
RBC: 3.16 MIL/uL — ABNORMAL LOW (ref 4.22–5.81)
RDW: 16.6 % — ABNORMAL HIGH (ref 11.5–15.5)
WBC: 9.1 10*3/uL (ref 4.0–10.5)
nRBC: 0.4 % — ABNORMAL HIGH (ref 0.0–0.2)

## 2020-10-29 LAB — SODIUM, URINE, RANDOM: Sodium, Ur: 11 mmol/L

## 2020-10-29 LAB — BASIC METABOLIC PANEL
Anion gap: 9 (ref 5–15)
Anion gap: 9 (ref 5–15)
BUN: 27 mg/dL — ABNORMAL HIGH (ref 8–23)
BUN: 25 mg/dL — ABNORMAL HIGH (ref 8–23)
CO2: 27 mmol/L (ref 22–32)
CO2: 26 mmol/L (ref 22–32)
Calcium: 7.7 mg/dL — ABNORMAL LOW (ref 8.9–10.3)
Calcium: 7.7 mg/dL — ABNORMAL LOW (ref 8.9–10.3)
Chloride: 102 mmol/L (ref 98–111)
Chloride: 103 mmol/L (ref 98–111)
Creatinine, Ser: 1.25 mg/dL — ABNORMAL HIGH (ref 0.61–1.24)
Creatinine, Ser: 1.32 mg/dL — ABNORMAL HIGH (ref 0.61–1.24)
GFR, Estimated: >60 mL/min (ref >60)
GFR, Estimated: >60 mL/min (ref >60)
Glucose, Bld: 145 mg/dL — ABNORMAL HIGH (ref 70–99)
Glucose, Bld: 152 mg/dL — ABNORMAL HIGH (ref 70–99)
Potassium: 3.4 mmol/L — ABNORMAL LOW (ref 3.5–5.1)
Potassium: 4.8 mmol/L (ref 3.5–5.1)
Sodium: 138 mmol/L (ref 135–145)
Sodium: 138 mmol/L (ref 135–145)

## 2020-10-29 LAB — GLUCOSE, CAPILLARY
Glucose-Capillary: 126 mg/dL — ABNORMAL HIGH (ref 70–99)
Glucose-Capillary: 119 mg/dL — ABNORMAL HIGH (ref 70–99)
Glucose-Capillary: 129 mg/dL — ABNORMAL HIGH (ref 70–99)
Glucose-Capillary: 147 mg/dL — ABNORMAL HIGH (ref 70–99)
Glucose-Capillary: 121 mg/dL — ABNORMAL HIGH (ref 70–99)
Glucose-Capillary: 149 mg/dL — ABNORMAL HIGH (ref 70–99)

## 2020-10-29 LAB — CREATININE, URINE, RANDOM: Creatinine, Urine: 216.38 mg/dL

## 2020-10-29 LAB — TRIGLYCERIDES: Triglycerides: 152 mg/dL — ABNORMAL HIGH (ref <150)

## 2020-10-29 MED ORDER — INFLUENZA VAC SPLIT QUAD 0.5 ML IM SUSY
0.5000 mL | PREFILLED_SYRINGE | INTRAMUSCULAR | Status: DC
  Filled 2020-10-29 (×3): qty 0.5

## 2020-10-29 MED ORDER — POTASSIUM CHLORIDE 20 MEQ PO PACK
40.0000 meq | PACK | Freq: Once | ORAL | Status: AC
  Administered 2020-10-29: 40 meq
  Filled 2020-10-29: qty 2

## 2020-10-29 MED ORDER — POTASSIUM CHLORIDE 20 MEQ/15ML (10%) PO SOLN
40.0000 meq | Freq: Once | ORAL | Status: AC
  Administered 2020-10-29: 40 meq
  Filled 2020-10-29 (×2): qty 30

## 2020-10-29 MED ORDER — LACTATED RINGERS IV BOLUS
1000.0000 mL | Freq: Once | INTRAVENOUS | Status: AC
  Administered 2020-10-29: 1000 mL via INTRAVENOUS

## 2020-10-29 NOTE — Progress Notes (Addendum)
NAME:  [name], MRN:  [id number], DOB:  [birth year], LOS: 8 ADMISSION DATE:  10/20/2020, CONSULTATION DATE:  10/21/2020 REFERRING MD: Dr. Loney Loh, CHIEF COMPLAINT:  Dyspnea  Brief History   64 year old male presented to ED after syncopal episode and was found to have large hiatal hernia compressing trachea.   Past Medical History  GERD, Gastroparesis, Barrett's esophagus, HLD, HTN, Iron deficiency anemia, Seizures, Schizophrenia, Developmental delay  Significant Hospital Events   10/29 admit, EGD done 10/30 transferred to ICU for respiratory distress, intubated.  Consults:  CVTS GI  Procedures:  ETT 10/30 >> Lt [place] CVL 10/30 >>  Robotic assisted hiatal hernia repair 11/3 Peg placement 11/3 Significant Diagnostic Tests:  CTA chest 10/29 > no PE, RUL ATX CT abd/pelvis 10/29  > large, stable gastric hernia with associated esophageal dilation and subsequent mass effect on the trachea.    Micro Data:  COVID 10/28 >> negative MRSA PCR 10/28 >> negative Blood 10/28 >> negative Sputum 10/30 >> negative  Antimicrobials:  Unasyn 10/28 >> 11/1  Interim history/subjective:  Overnight patient with several episode of agitation on 1.9mcg of Precedex switched to propofol and fentanyl infusions. Hypotension in 70/50 overnight sp 500 mlL of LR overnight. He is sleeping this morning and intermittently agitated with increased RR. Increased secretions on vent.  Objective   Blood pressure 128/61, pulse (!) 110, temperature 98.4 F (36.9 C), temperature source Axillary, resp. rate (!) 24, height 5\' 4"  (1.626 m), weight 81 kg, SpO2 96 %.    Vent Mode: PRVC FiO2 (%):  [40 %] 40 % Set Rate:  [20 bmp] 20 bmp Vt Set:  [410 mL] 410 mL PEEP:  [5 cmH20] 5 cmH20 Plateau Pressure:  [21 cmH20-36 cmH20] 36 cmH20   Intake/Output Summary (Last 24 hours) at 10/29/2020 1025 Last data filed at 10/29/2020 0500 Gross per 24 hour  Intake 1839.88 ml  Output 1280 ml  Net 559.88 ml   Filed  Weights   10/27/20 0500 10/28/20 0500 10/29/20 0500  Weight: 77.9 kg 80 kg 81 kg   Examination: General: Resting comfortably, no acute distress HENT: ETT inplace Lungs: clear to ascultation anteriorly Cardiovascular: normal S1 and S2, regular rate and rhythm, no  murmur Abdomen: Distended, Peg tube in place, soft, non tender, + bs Extremities: warm, dry, 2+ edema bilaterally Neuro: Rass -1  Skin: surgical incisions clean and dry no bleeding   Resolved Hospital Problem list   Circulatory shock  Assessment & Plan:   Acute hypoxic/hypercapnic respiratory failure 2nd to aspiration pneumonia. - Received 4 days of antibiotics, given no focal infiltrate on chest x-ray, no leukocytosis on admission, suspect aspiration event without clinical pneumonia. Continues to be afebrile off antibiotics  - Currently propofol and fentanyl for sedation and tritrate down as tolerated - Fi02 increased to 50% not tolerating PS trials as immediately becomes tachypnea with desaturations. Increase secretions today.  - Will continue on vent today, will likely be unable to tolerate extubation today  Hypovolemic hypotension BP in 70s/50s overnight improved with 500 mL LR bolus. Now normotensive. Likely due to diuresis yesterday. Cr. Increased to 1.25 today - monitor BMP - Continue to monitor - Monitor urine ouput  Pneumothorax Small 2 cm right apical pneumothorax on CXR new from 11/4. Appears resolved today CXR shows lungs marking to periphery   Large hiatal hernia. GERD, Barrett's esophagus, gastroparesis. - GI consulted - seen by Dr. 13/4 from thoracic surgery, EGD completed with esophageal mass ruled out - sp hiatal hernia repair  and peg tube on 11/3, tolerated well without complications  Anemia of critical illness. - Continue to monitor CBCs - transfuse for Hb < 7 or significant bleeding  Acute metabolic encephalopathy 2nd to sepsis, hypoxia, hypercapnia.  Seizure disorder, Schizophrenia. -  RASS goal 0 to -1 - Requiring more sedation for agitation with fentanyl and propofol for agitation - Continue home valproic acid 250 mg BID, quetiapine 400mg  BID,  Lamotrigine 100 mg daily  Constipation Continues to be distented on exam today. BM on 11/5 and this morning - Continue on bowel regimin Miralax 17g, docusate  Hypothyroidism. - Synthroid IV  Hyperglycemia Started on tube feeds yesterday, at goal rate of 60 ml/hr, tolerating well -SSI   Best practice:  Diet: Tube feeds 7360mL/hr  DVT prophylaxis: lovenox GI prophylaxis: protonix Mobility: bed rest Code Status: full Disposition: ICU  Labs   CBC: Recent Labs  Lab 10/24/20 0408 10/24/20 0408 10/25/20 0307 10/25/20 0307 10/26/20 0330 10/26/20 1659 10/26/20 1827 10/27/20 0349 10/29/20 0351  WBC 18.5*  --  17.7*  --  10.2  --   --  9.4 9.1  NEUTROABS  --   --   --   --   --   --   --  6.7  --   HGB 7.8*   < > 8.1*   < > 7.2* 8.8* 9.5* 9.0* 8.2*  HCT 25.6*   < > 26.8*   < > 24.6* 26.0* 28.0* 29.6* 27.1*  MCV 84.5  --  85.4  --  84.5  --   --  84.8 85.8  PLT 207  --  280  --  271  --   --  239 261   < > = values in this interval not displayed.    Basic Metabolic Panel: Recent Labs  Lab 10/24/20 0408 10/24/20 1115 10/25/20 0307 10/25/20 0307 10/25/20 1552 10/26/20 0330 10/26/20 0330 10/26/20 1659 10/26/20 1827 10/27/20 0349 10/28/20 0443 10/29/20 0351  NA   < >  --  144   < >  --  144   < > 142 143 140 138 138  K   < >  --  3.4*   < >  --  3.2*   < > 3.5 3.9 3.7 3.7 3.4*  CL   < >  --  107   < >  --  107  --  108  --  107 103 102  CO2   < >  --  27  --   --  26  --   --   --  23 27 27   GLUCOSE   < >  --  131*   < >  --  84  --  118*  --  117* 162* 145*  BUN   < >  --  28*   < >  --  27*  --  17  --  16 15 27*  CREATININE   < >  --  1.26*   < >  --  1.14  --  0.80  --  0.98 1.16 1.25*  CALCIUM   < >  --  8.0*  --   --  8.0*  --   --   --  7.8* 8.1* 7.7*  MG  --  2.3 2.7*  --  2.4  --   --   --   --   2.2  --   --   PHOS  --  3.4 3.6  --  2.6  --   --   --   --   --   --   --    < > =  values in this interval not displayed.   GFR: Estimated Creatinine Clearance: 58.8 mL/min (A) (by C-G formula based on SCr of 1.25 mg/dL (H)). Recent Labs  Lab 10/25/20 0307 10/26/20 0330 10/27/20 0349 10/29/20 0351  WBC 17.7* 10.2 9.4 9.1    Liver Function Tests: No results for input(s): AST, ALT, ALKPHOS, BILITOT, PROT, ALBUMIN in the last 168 hours. No results for input(s): LIPASE, AMYLASE in the last 168 hours. No results for input(s): AMMONIA in the last 168 hours.  ABG    Component Value Date/Time   PHART 7.297 (L) 10/26/2020 1827   PCO2ART 49.8 (H) 10/26/2020 1827   PO2ART 87 10/26/2020 1827   HCO3 24.5 10/26/2020 1827   TCO2 26 10/26/2020 1827   ACIDBASEDEF 2.0 10/26/2020 1827   O2SAT 96.0 10/26/2020 1827     Coagulation Profile: No results for input(s): INR, PROTIME in the last 168 hours.  Cardiac Enzymes: No results for input(s): CKTOTAL, CKMB, CKMBINDEX, TROPONINI in the last 168 hours.  HbA1C: Hgb A1c MFr Bld  Date/Time Value Ref Range Status  10/22/2020 03:46 AM 5.8 (H) 4.8 - 5.6 % Final    Comment:    (NOTE) Pre diabetes:          5.7%-6.4%  Diabetes:              >6.4%  Glycemic control for   <7.0% adults with diabetes   10/05/2007 06:17 AM   Final   5.4 (NOTE)   The ADA recommends the following therapeutic goals for glycemic   control related to Hgb A1C measurement:   Goal of Therapy:   < 7.0% Hgb A1C   Action Suggested:  > 8.0% Hgb A1C   Ref:  Diabetes Care, 22, Suppl. 1, 1999    CBG: Recent Labs  Lab 10/28/20 1545 10/28/20 1944 10/28/20 2341 10/29/20 0354 10/29/20 0758  GLUCAP 144* 179* 94 126* 119*    Past Medical History  He,  has a past medical history of Barrett esophagus, Dysphagia, Gastroparesis, GERD (gastroesophageal reflux disease), Hypercholesteremia, Hypertension, Iron deficiency anemia, Mental retardation, Schizophrenia (HCC), and  Seizure (HCC).   Surgical History    Past Surgical History:  Procedure Laterality Date  . BIOPSY  10/21/2020   Procedure: BIOPSY;  Surgeon: Iva Boop, MD;  Location: [facility] ENDOSCOPY;  Service: Endoscopy;;  . CRANIOTOMY FOR CYST FENESTRATION    . ESOPHAGOGASTRODUODENOSCOPY N/A 01/09/2015   Procedure: ESOPHAGOGASTRODUODENOSCOPY (EGD);  Surgeon: Louis Meckel, MD;  Location: [facility] ENDOSCOPY;  Service: Endoscopy;  Laterality: N/A;  . ESOPHAGOGASTRODUODENOSCOPY N/A 10/26/2020   Procedure: ESOPHAGOGASTRODUODENOSCOPY (EGD);  Surgeon: Corliss Skains, MD;  Location: [facility] OR;  Service: Thoracic;  Laterality: N/A;  . ESOPHAGOGASTRODUODENOSCOPY (EGD) WITH PROPOFOL N/A 10/21/2020   Procedure: ESOPHAGOGASTRODUODENOSCOPY (EGD) WITH PROPOFOL;  Surgeon: Iva Boop, MD;  Location: [facility] ENDOSCOPY;  Service: Endoscopy;  Laterality: N/A;  . PEG PLACEMENT N/A 10/26/2020   Procedure: PERCUTANEOUS ENDOSCOPIC GASTROSTOMY (PEG) PLACEMENT;  Surgeon: Corliss Skains, MD;  Location: [facility] OR;  Service: Thoracic;  Laterality: N/A;  . XI ROBOTIC ASSISTED HIATAL HERNIA REPAIR N/A 10/26/2020   Procedure: XI ROBOTIC ASSISTED HIATAL HERNIA REPAIR;  Surgeon: Corliss Skains, MD;  Location: [facility] OR;  Service: Thoracic;  Laterality: N/A;     Social History   reports that he has never smoked. He has never used smokeless tobacco. He reports that he does not drink alcohol and does not use drugs.   Family History   His family history is not on file.  Allergies Allergies  Allergen Reactions  . Vancomycin Other (See Comments)    Unknown-possible hives      Home Medications  Prior to Admission medications   Medication Sig Start Date End Date Taking? Authorizing Provider  benazepril (LOTENSIN) 20 MG tablet Take 20 mg by mouth daily.   Yes [provider]  Cholecalciferol (VITAMIN D) 2000 units tablet Take 2,000 Units by mouth daily.   Yes [provider]  divalproex (DEPAKOTE ER) 500 MG 24 hr  tablet Take 500 mg by mouth daily.   Yes [provider]  esomeprazole (NEXIUM) 40 MG capsule Take 40 mg by mouth in the morning and at bedtime.   Yes [provider]  lamoTRIgine (LAMICTAL) 100 MG tablet Take 100 mg by mouth daily.   Yes [provider]  levothyroxine (SYNTHROID, LEVOTHROID) 75 MCG tablet Take 75 mcg by mouth daily.   Yes [provider]  metoCLOPramide (REGLAN) 5 MG tablet Take 1 tablet (5 mg total) by mouth 3 (three) times daily before meals. Patient taking differently: Take 5 mg by mouth in the morning and at bedtime.  07/27/15  Yes Regalado, Belkys A, MD  propranolol (INDERAL) 10 MG tablet Take 10 mg by mouth daily. 03/31/18  Yes [provider]  QUEtiapine (SEROQUEL) 400 MG tablet Take 400 mg by mouth 2 (two) times daily.    Yes [provider]  rosuvastatin (CRESTOR) 20 MG tablet Take 20 mg by mouth daily.   Yes [provider]     Quincy Simmonds, MD PGY-1 [facility] Internal Medicine 10/29/20 08:03 AM Pager [phone]

## 2020-10-29 NOTE — Progress Notes (Signed)
Pharmacy Electrolyte Replacement  Recent Labs:  Recent Labs    10/27/20 0349 10/28/20 0443 10/29/20 0351  K 3.7   < > 3.4*  MG 2.2  --   --   CREATININE 0.98   < > 1.25*   < > = values in this interval not displayed.    Low Critical Values (K </= 2.5, Phos </= 1, Mg </= 1) Present: None  Plan:  K 40 mEq x 1 per tube  Elmer Sow, PharmD, BCPS, BCCCP Clinical Pharmacist [phone]  Please check AMION for all [facility] numbers  10/29/2020 8:50 AM

## 2020-10-30 DIAGNOSIS — E039 Hypothyroidism, unspecified: Secondary | ICD-10-CM

## 2020-10-30 DIAGNOSIS — K449 Diaphragmatic hernia without obstruction or gangrene: Secondary | ICD-10-CM | POA: Diagnosis not present

## 2020-10-30 DIAGNOSIS — R55 Syncope and collapse: Secondary | ICD-10-CM | POA: Diagnosis not present

## 2020-10-30 DIAGNOSIS — J9601 Acute respiratory failure with hypoxia: Secondary | ICD-10-CM | POA: Diagnosis not present

## 2020-10-30 LAB — GLUCOSE, CAPILLARY
Glucose-Capillary: 131 mg/dL — ABNORMAL HIGH (ref 70–99)
Glucose-Capillary: 136 mg/dL — ABNORMAL HIGH (ref 70–99)
Glucose-Capillary: 137 mg/dL — ABNORMAL HIGH (ref 70–99)
Glucose-Capillary: 146 mg/dL — ABNORMAL HIGH (ref 70–99)
Glucose-Capillary: 146 mg/dL — ABNORMAL HIGH (ref 70–99)
Glucose-Capillary: 155 mg/dL — ABNORMAL HIGH (ref 70–99)

## 2020-10-30 LAB — BASIC METABOLIC PANEL
Anion gap: 8 (ref 5–15)
BUN: 20 mg/dL (ref 8–23)
CO2: 26 mmol/L (ref 22–32)
Calcium: 7.7 mg/dL — ABNORMAL LOW (ref 8.9–10.3)
Chloride: 104 mmol/L (ref 98–111)
Creatinine, Ser: 1.04 mg/dL (ref 0.61–1.24)
GFR, Estimated: >60 mL/min (ref >60)
Glucose, Bld: 164 mg/dL — ABNORMAL HIGH (ref 70–99)
Potassium: 4.2 mmol/L (ref 3.5–5.1)
Sodium: 138 mmol/L (ref 135–145)

## 2020-10-30 LAB — CBC
HCT: 25.6 % — ABNORMAL LOW (ref 39.0–52.0)
Hemoglobin: 7.6 g/dL — ABNORMAL LOW (ref 13.0–17.0)
MCH: 25.8 pg — ABNORMAL LOW (ref 26.0–34.0)
MCHC: 29.7 g/dL — ABNORMAL LOW (ref 30.0–36.0)
MCV: 86.8 fL (ref 80.0–100.0)
Platelets: 290 10*3/uL (ref 150–400)
RBC: 2.95 MIL/uL — ABNORMAL LOW (ref 4.22–5.81)
RDW: 17.2 % — ABNORMAL HIGH (ref 11.5–15.5)
WBC: 9.7 10*3/uL (ref 4.0–10.5)
nRBC: 0.3 % — ABNORMAL HIGH (ref 0.0–0.2)

## 2020-10-30 MED ORDER — FUROSEMIDE 10 MG/ML IJ SOLN
40.0000 mg | Freq: Once | INTRAMUSCULAR | Status: AC
  Administered 2020-10-30: 40 mg via INTRAVENOUS
  Filled 2020-10-30: qty 4

## 2020-10-30 NOTE — Progress Notes (Signed)
NAME:  [name], MRN:  [id number], DOB:  1956-07-21, LOS: 9 ADMISSION DATE:  10/20/2020, CONSULTATION DATE:  10/21/2020 REFERRING MD: Dr. Loney Loh, CHIEF COMPLAINT:  Dyspnea  Brief History   64 year old male presented to ED after syncopal episode and was found to have large hiatal hernia compressing trachea.   Past Medical History  GERD, Gastroparesis, Barrett's esophagus, HLD, HTN, Iron deficiency anemia, Seizures, Schizophrenia, Developmental delay  Significant Hospital Events   10/29 admit, EGD done 10/30 transferred to ICU for respiratory distress, intubated. 11/3 Hiatal hernia repair, g-tube  Consults:  CVTS GI  Procedures:  ETT 10/30 >> Lt [place] CVL 10/30 >>  Robotic assisted hiatal hernia repair 11/3 Peg placement 11/3 Significant Diagnostic Tests:  CTA chest 10/29 > no PE, RUL ATX CT abd/pelvis 10/29  > large, stable gastric hernia with associated esophageal dilation and subsequent mass effect on the trachea.    Micro Data:  COVID 10/28 >> negative MRSA PCR 10/28 >> negative Blood 10/28 >> negative Sputum 10/30 >> negative  Antimicrobials:  Unasyn 10/28 >> 11/1  Interim history/subjective:  Failed PSV again today due to rapid shallow breathing. CXR looks wet.  Objective   Blood pressure 128/60, pulse 82, temperature 97.8 F (36.6 C), temperature source Axillary, resp. rate (!) 22, height 5\' 4"  (1.626 m), weight 81 kg, SpO2 92 %.    Vent Mode: PRVC FiO2 (%):  [40 %] 40 % Set Rate:  [20 bmp] 20 bmp Vt Set:  [410 mL] 410 mL PEEP:  [5 cmH20] 5 cmH20 Plateau Pressure:  [18 cmH20-20 cmH20] 20 cmH20   Intake/Output Summary (Last 24 hours) at 10/30/2020 1153 Last data filed at 10/30/2020 1125 Gross per 24 hour  Intake 1485.12 ml  Output 1030 ml  Net 455.12 ml   Filed Weights   10/27/20 0500 10/28/20 0500 10/29/20 0500  Weight: 77.9 kg 80 kg 81 kg   Examination: General: Resting comfortably, no acute distress HENT: ETT inplace Lungs: clear to  ascultation anteriorly Cardiovascular: normal S1 and S2, regular rate and rhythm, no  murmur Abdomen: Distended, Peg tube in place, soft, non tender, + bs Extremities: warm, dry, 2+ edema bilaterally Neuro: Rass -1  Skin: surgical incisions clean and dry no bleeding   Resolved Hospital Problem list   Circulatory shock  Assessment & Plan:   Acute hypoxic/hypercapnic respiratory failure 2nd to aspiration pneumonia. - Currently propofol and fentanyl for sedation and tritrate down as tolerated - Suspect volume overload contributing to lack of PSV success, lasix IV x 1 and assess response  Large hiatal hernia. GERD, Barrett's esophagus, gastroparesis. - GI consulted - seen by Dr. 13/06/21 from thoracic surgery, EGD completed with esophageal mass ruled out - sp hiatal hernia repair and peg tube on 11/3, tolerated well without complications  Anemia of critical illness. - Continue to monitor CBCs - transfuse for Hb < 7 or significant bleeding  Acute metabolic encephalopathy 2nd to sepsis, hypoxia, hypercapnia.  Seizure disorder, Schizophrenia. - RASS goal 0 to -1 - Requiring more sedation for agitation with fentanyl and propofol for agitation - Continue home valproic acid 250 mg BID, quetiapine 400mg  BID,  Lamotrigine 100 mg daily  Constipation - Continue on bowel regimin Miralax 17g, docusate  Hypothyroidism. - Synthroid  Hyperglycemia -SSI   Best practice:  Diet: Tube feeds  DVT prophylaxis: lovenox GI prophylaxis: protonix Mobility: bed rest Code Status: full Disposition: ICU  Labs   CBC: Recent Labs  Lab 10/25/20 0307 10/25/20 0307 10/26/20 0330 10/26/20  0330 10/26/20 1659 10/26/20 1827 10/27/20 0349 10/29/20 0351 10/30/20 0207  WBC 17.7*  --  10.2  --   --   --  9.4 9.1 9.7  NEUTROABS  --   --   --   --   --   --  6.7  --   --   HGB 8.1*   < > 7.2*   < > 8.8* 9.5* 9.0* 8.2* 7.6*  HCT 26.8*   < > 24.6*   < > 26.0* 28.0* 29.6* 27.1* 25.6*  MCV 85.4  --   84.5  --   --   --  84.8 85.8 86.8  PLT 280  --  271  --   --   --  239 261 290   < > = values in this interval not displayed.    Basic Metabolic Panel: Recent Labs  Lab 10/24/20 0408 10/24/20 1115 10/25/20 0307 10/25/20 1552 10/26/20 0330 10/27/20 0349 10/28/20 0443 10/29/20 0351 10/29/20 1217 10/30/20 0207  NA   < >  --  144  --    < > 140 138 138 138 138  K   < >  --  3.4*  --    < > 3.7 3.7 3.4* 4.8 4.2  CL   < >  --  107  --    < > 107 103 102 103 104  CO2   < >  --  27  --    < > 23 27 27 26 26   GLUCOSE   < >  --  131*  --    < > 117* 162* 145* 152* 164*  BUN   < >  --  28*  --    < > 16 15 27* 25* 20  CREATININE   < >  --  1.26*  --    < > 0.98 1.16 1.25* 1.32* 1.04  CALCIUM   < >  --  8.0*  --    < > 7.8* 8.1* 7.7* 7.7* 7.7*  MG  --  2.3 2.7* 2.4  --  2.2  --   --   --   --   PHOS  --  3.4 3.6 2.6  --   --   --   --   --   --    < > = values in this interval not displayed.   GFR: Estimated Creatinine Clearance: 70.7 mL/min (by C-G formula based on SCr of 1.04 mg/dL). Recent Labs  Lab 10/26/20 0330 10/27/20 0349 10/29/20 0351 10/30/20 0207  WBC 10.2 9.4 9.1 9.7    Liver Function Tests: No results for input(s): AST, ALT, ALKPHOS, BILITOT, PROT, ALBUMIN in the last 168 hours. No results for input(s): LIPASE, AMYLASE in the last 168 hours. No results for input(s): AMMONIA in the last 168 hours.  ABG    Component Value Date/Time   PHART 7.297 (L) 10/26/2020 1827   PCO2ART 49.8 (H) 10/26/2020 1827   PO2ART 87 10/26/2020 1827   HCO3 24.5 10/26/2020 1827   TCO2 26 10/26/2020 1827   ACIDBASEDEF 2.0 10/26/2020 1827   O2SAT 96.0 10/26/2020 1827     Coagulation Profile: No results for input(s): INR, PROTIME in the last 168 hours.  Cardiac Enzymes: No results for input(s): CKTOTAL, CKMB, CKMBINDEX, TROPONINI in the last 168 hours.  HbA1C: Hgb A1c MFr Bld  Date/Time Value Ref Range Status  10/22/2020 03:46 AM 5.8 (H) 4.8 - 5.6 % Final    Comment:     (  NOTE) Pre diabetes:          5.7%-6.4%  Diabetes:              >6.4%  Glycemic control for   <7.0% adults with diabetes   10/05/2007 06:17 AM   Final   5.4 (NOTE)   The ADA recommends the following therapeutic goals for glycemic   control related to Hgb A1C measurement:   Goal of Therapy:   < 7.0% Hgb A1C   Action Suggested:  > 8.0% Hgb A1C   Ref:  Diabetes Care, 22, Suppl. 1, 1999    CBG: Recent Labs  Lab 10/29/20 1548 10/29/20 1932 10/29/20 2300 10/30/20 0335 10/30/20 0807  GLUCAP 147* 121* 149* 136* 131*    Past Medical History  He,  has a past medical history of Barrett esophagus, Dysphagia, Gastroparesis, GERD (gastroesophageal reflux disease), Hypercholesteremia, Hypertension, Iron deficiency anemia, Mental retardation, Schizophrenia (HCC), and Seizure (HCC).   Surgical History    Past Surgical History:  Procedure Laterality Date  . BIOPSY  10/21/2020   Procedure: BIOPSY;  Surgeon: Iva Boop, MD;  Location[facility] ENDOSCOPY;  Service: Endoscopy;;  . CRANIOTOMY FOR CYST FENESTRATION    . ESOPHAGOGASTRODUODENOSCOPY N/A 01/09/2015   Procedure: ESOPHAGOGASTRODUODENOSCOPY (EGD);  Surgeon: Louis Meckel, MD;  Location: [facility] ENDOSCOPY;  Service: Endoscopy;  Laterality: N/A;  . ESOPHAGOGASTRODUODENOSCOPY N/A 10/26/2020   Procedure: ESOPHAGOGASTRODUODENOSCOPY (EGD);  Surgeon: Corliss Skains, MD;  Location: [facility] OR;  Service: Thoracic;  Laterality: N/A;  . ESOPHAGOGASTRODUODENOSCOPY (EGD) WITH PROPOFOL N/A 10/21/2020   Procedure: ESOPHAGOGASTRODUODENOSCOPY (EGD) WITH PROPOFOL;  Surgeon: Iva Boop, MD;  Location: [facility] ENDOSCOPY;  Service: Endoscopy;  Laterality: N/A;  . PEG PLACEMENT N/A 10/26/2020   Procedure: PERCUTANEOUS ENDOSCOPIC GASTROSTOMY (PEG) PLACEMENT;  Surgeon: Corliss Skains, MD;  Location: [facility] OR;  Service: Thoracic;  Laterality: N/A;  . XI ROBOTIC ASSISTED HIATAL HERNIA REPAIR N/A 10/26/2020   Procedure: XI ROBOTIC ASSISTED HIATAL HERNIA REPAIR;   Surgeon: Corliss Skains, MD;  Location: [facility] OR;  Service: Thoracic;  Laterality: N/A;     Social History   reports that he has never smoked. He has never used smokeless tobacco. He reports that he does not drink alcohol and does not use drugs.   Family History   His family history is not on file.   Allergies Allergies  Allergen Reactions  . Vancomycin Other (See Comments)    Unknown-possible hives      Home Medications  Prior to Admission medications   Medication Sig Start Date End Date Taking? Authorizing Provider  benazepril (LOTENSIN) 20 MG tablet Take 20 mg by mouth daily.   Yes [provider]  Cholecalciferol (VITAMIN D) 2000 units tablet Take 2,000 Units by mouth daily.   Yes [provider]  divalproex (DEPAKOTE ER) 500 MG 24 hr tablet Take 500 mg by mouth daily.   Yes [provider]  esomeprazole (NEXIUM) 40 MG capsule Take 40 mg by mouth in the morning and at bedtime.   Yes [provider]  lamoTRIgine (LAMICTAL) 100 MG tablet Take 100 mg by mouth daily.   Yes [provider]  levothyroxine (SYNTHROID, LEVOTHROID) 75 MCG tablet Take 75 mcg by mouth daily.   Yes [provider]  metoCLOPramide (REGLAN) 5 MG tablet Take 1 tablet (5 mg total) by mouth 3 (three) times daily before meals. Patient taking differently: Take 5 mg by mouth in the morning and at bedtime.  07/27/15  Yes Regalado, Prentiss Bells, MD  propranolol (INDERAL) 10 MG tablet Take 10 mg by mouth daily. 03/31/18  Yes [provider]  QUEtiapine (SEROQUEL) 400 MG tablet Take 400 mg by mouth 2 (two) times daily.    Yes [provider]  rosuvastatin (CRESTOR) 20 MG tablet Take 20 mg by mouth daily.   Yes [provider]    CRITICAL CARE Performed by: Lesia SagoMatthew R Kahmari Herard   Total critical care time: 32 minutes  Critical care time was exclusive of separately billable procedures and treating other patients.  Critical care was necessary  to treat or prevent imminent or life-threatening deterioration.  Critical care was time spent personally by me on the following activities: development of treatment plan with patient and/or surrogate as well as nursing, discussions with consultants, evaluation of patient's response to treatment, examination of patient, obtaining history from patient or surrogate, ordering and performing treatments and interventions, ordering and review of laboratory studies, ordering and review of radiographic studies, pulse oximetry and re-evaluation of patient's condition.

## 2020-10-30 NOTE — Progress Notes (Signed)
eLink Physician-Brief Progress Note Patient Name: [name] DOB: 09-09-1956 MRN: [id number]   Date of Service  10/30/2020  HPI/Events of Note  Patient needs restraints order renewed for safety on the ventilator.Marland Kitchen  eICU Interventions  Restraints order renewed.        Thomasene Lot Shayon Trompeter 10/30/2020, 12:45 AM

## 2020-10-30 NOTE — Progress Notes (Signed)
RT attempted to wean pt with sister and caregiver at bedside. Pt required 18/5 to achieve adequate volumes during SBT. Pt RR quickly increased to a rate of 54. RT, sister and caregiver attempted to calm pt to which his RR lowered to 42 and maintained. Pt continues to follow minimal to no commands. Pt returned to full support at this time. Dr. Judeth Horn made aware. RT will continue to monitor.

## 2020-10-31 DIAGNOSIS — J9601 Acute respiratory failure with hypoxia: Secondary | ICD-10-CM | POA: Diagnosis not present

## 2020-10-31 LAB — GLUCOSE, CAPILLARY
Glucose-Capillary: 132 mg/dL — ABNORMAL HIGH (ref 70–99)
Glucose-Capillary: 114 mg/dL — ABNORMAL HIGH (ref 70–99)
Glucose-Capillary: 160 mg/dL — ABNORMAL HIGH (ref 70–99)
Glucose-Capillary: 178 mg/dL — ABNORMAL HIGH (ref 70–99)
Glucose-Capillary: 150 mg/dL — ABNORMAL HIGH (ref 70–99)
Glucose-Capillary: 165 mg/dL — ABNORMAL HIGH (ref 70–99)

## 2020-10-31 LAB — TRIGLYCERIDES: Triglycerides: 173 mg/dL — ABNORMAL HIGH (ref <150)

## 2020-10-31 MED ORDER — DEXMEDETOMIDINE HCL IN NACL 400 MCG/100ML IV SOLN
0.0000 ug/kg/h | INTRAVENOUS | Status: AC
  Administered 2020-10-31 (×2): 0.4 ug/kg/h via INTRAVENOUS
  Administered 2020-11-01: 0.2 ug/kg/h via INTRAVENOUS
  Administered 2020-11-02: 1.2 ug/kg/h via INTRAVENOUS
  Administered 2020-11-02: 0.7 ug/kg/h via INTRAVENOUS
  Administered 2020-11-02: 0.9 ug/kg/h via INTRAVENOUS
  Administered 2020-11-02 – 2020-11-03 (×5): 1.2 ug/kg/h via INTRAVENOUS
  Filled 2020-10-31 (×7): qty 100
  Filled 2020-10-31: qty 200
  Filled 2020-10-31 (×3): qty 100

## 2020-10-31 MED ORDER — OXYCODONE HCL 5 MG/5ML PO SOLN
5.0000 mg | Freq: Four times a day (QID) | ORAL | Status: DC
  Administered 2020-10-31 – 2020-11-02 (×8): 5 mg
  Filled 2020-10-31 (×8): qty 5

## 2020-10-31 MED ORDER — BISACODYL 10 MG RE SUPP
10.0000 mg | Freq: Once | RECTAL | Status: AC
  Administered 2020-10-31: 10 mg via RECTAL
  Filled 2020-10-31: qty 1

## 2020-10-31 NOTE — Progress Notes (Signed)
NAME:  [name], MRN:  [id number], DOB:  02/03/1956, LOS: 10 ADMISSION DATE:  10/20/2020, CONSULTATION DATE:  10/21/2020 REFERRING MD: Dr. Loney Loh, CHIEF COMPLAINT:  Dyspnea  Brief History   64 year old male presented to ED after syncopal episode and was found to have large hiatal hernia compressing trachea.   Past Medical History  GERD, Gastroparesis, Barrett's esophagus, HLD, HTN, Iron deficiency anemia, Seizures, Schizophrenia, Developmental delay  Significant Hospital Events   10/29 admit, EGD done 10/30 transferred to ICU for respiratory distress, intubated. 11/3 Hiatal hernia repair, g-tube  Consults:  CVTS GI  Procedures:  ETT 10/30 >> Lt [place] CVL 10/30 >>  Robotic assisted hiatal hernia repair 11/3 Peg placement 11/3 CXR 11/4 >> 2 cm right apical pneumothorax CXR 11/6 >> No residual pneumothorax Significant Diagnostic Tests:  CTA chest 10/29 > no PE, RUL ATX CT abd/pelvis 10/29  > large, stable gastric hernia with associated esophageal dilation and subsequent mass effect on the trachea.    Micro Data:  COVID 10/28 >> negative MRSA PCR 10/28 >> negative Blood 10/28 >> negative Sputum 10/30 >> negative  Antimicrobials:  Unasyn 10/28 >> 11/1  Interim history/subjective:  Currently PSV 16/5. Good uop yesterday on diuresis. Still very sedated on propofol and fentanyl  Objective   Blood pressure (!) 109/56, pulse (!) 57, temperature 98.3 F (36.8 C), temperature source Axillary, resp. rate 20, height 5\' 4"  (1.626 m), weight 79.3 kg, SpO2 95 %.    Vent Mode: PRVC FiO2 (%):  [40 %] 40 % Set Rate:  [20 bmp] 20 bmp Vt Set:  [410 mL] 410 mL PEEP:  [5 cmH20] 5 cmH20 Plateau Pressure:  [20 cmH20] 20 cmH20   Intake/Output Summary (Last 24 hours) at 10/31/2020 0815 Last data filed at 10/31/2020 0600 Gross per 24 hour  Intake 1822.51 ml  Output 2075 ml  Net -252.49 ml   Filed Weights   10/28/20 0500 10/29/20 0500 10/31/20 0426  Weight: 80 kg 81 kg 79.3  kg   Examination: General: Resting comfortably, no acute distress HENT: ETT in place Lungs: clear to ascultation anteriorly Cardiovascular: normal S1 and S2, regular rate and rhythm, no  murmur Abdomen: Distended, Peg tube in place, soft, non tender, + bs Extremities: warm, dry, 2+ edema bilaterally Neuro: Rass -1  Skin: surgical incisions clean and dry no bleeding   Resolved Hospital Problem list   Circulatory shock  Assessment & Plan:   Acute hypoxic/hypercapnic respiratory failure 2nd to aspiration pneumonia. - Currently on propofol and fentanyl for sedation  - Diuresed well after 1 dose of lasix ysterday - Will attempt to titrate down and stop  Propofol - Start precedex for sedation and oxycodone 5 mg every 6 hours for agitation and pain control  Large hiatal hernia. GERD, Barrett's esophagus, gastroparesis. - GI consulted - seen by Dr. 13/08/21 from thoracic surgery, EGD completed with esophageal mass ruled out - sp hiatal hernia repair and peg tube on 11/3, tolerated well without complications  Anemia of critical illness. - Continue to monitor CBCs - transfuse for Hb < 7 or significant bleeding  Acute metabolic encephalopathy 2nd to sepsis, hypoxia, hypercapnia.  Seizure disorder, Schizophrenia. - RASS goal 0 to -1 - Will try to tritrate down on propofol - Continue home valproic acid 250 mg BID, quetiapine 400mg  BID,  Lamotrigine 100 mg daily  Constipation - Continue on bowel regimin Miralax 17g, docusate - bisacodyl suppository today  Hypothyroidism. - Synthroid  Hyperglycemia -SSI   Best practice:  Diet: Tube feeds  DVT prophylaxis: lovenox GI prophylaxis: protonix Mobility: bed rest Code Status: full Disposition: ICU  Labs   CBC: Recent Labs  Lab 10/25/20 0307 10/25/20 0307 10/26/20 0330 10/26/20 0330 10/26/20 1659 10/26/20 1827 10/27/20 0349 10/29/20 0351 10/30/20 0207  WBC 17.7*  --  10.2  --   --   --  9.4 9.1 9.7  NEUTROABS  --    --   --   --   --   --  6.7  --   --   HGB 8.1*   < > 7.2*   < > 8.8* 9.5* 9.0* 8.2* 7.6*  HCT 26.8*   < > 24.6*   < > 26.0* 28.0* 29.6* 27.1* 25.6*  MCV 85.4  --  84.5  --   --   --  84.8 85.8 86.8  PLT 280  --  271  --   --   --  239 261 290   < > = values in this interval not displayed.    Basic Metabolic Panel: Recent Labs  Lab 10/24/20 1115 10/25/20 0307 10/25/20 1552 10/26/20 0330 10/27/20 0349 10/28/20 0443 10/29/20 0351 10/29/20 1217 10/30/20 0207  NA  --  144  --    < > 140 138 138 138 138  K  --  3.4*  --    < > 3.7 3.7 3.4* 4.8 4.2  CL  --  107  --    < > 107 103 102 103 104  CO2  --  27  --    < > 23 27 27 26 26   GLUCOSE  --  131*  --    < > 117* 162* 145* 152* 164*  BUN  --  28*  --    < > 16 15 27* 25* 20  CREATININE  --  1.26*  --    < > 0.98 1.16 1.25* 1.32* 1.04  CALCIUM  --  8.0*  --    < > 7.8* 8.1* 7.7* 7.7* 7.7*  MG 2.3 2.7* 2.4  --  2.2  --   --   --   --   PHOS 3.4 3.6 2.6  --   --   --   --   --   --    < > = values in this interval not displayed.   GFR: Estimated Creatinine Clearance: 70 mL/min (by C-G formula based on SCr of 1.04 mg/dL). Recent Labs  Lab 10/26/20 0330 10/27/20 0349 10/29/20 0351 10/30/20 0207  WBC 10.2 9.4 9.1 9.7    Liver Function Tests: No results for input(s): AST, ALT, ALKPHOS, BILITOT, PROT, ALBUMIN in the last 168 hours. No results for input(s): LIPASE, AMYLASE in the last 168 hours. No results for input(s): AMMONIA in the last 168 hours.  ABG    Component Value Date/Time   PHART 7.297 (L) 10/26/2020 1827   PCO2ART 49.8 (H) 10/26/2020 1827   PO2ART 87 10/26/2020 1827   HCO3 24.5 10/26/2020 1827   TCO2 26 10/26/2020 1827   ACIDBASEDEF 2.0 10/26/2020 1827   O2SAT 96.0 10/26/2020 1827     Coagulation Profile: No results for input(s): INR, PROTIME in the last 168 hours.  Cardiac Enzymes: No results for input(s): CKTOTAL, CKMB, CKMBINDEX, TROPONINI in the last 168 hours.  HbA1C: Hgb A1c MFr Bld  Date/Time  Value Ref Range Status  10/22/2020 03:46 AM 5.8 (H) 4.8 - 5.6 % Final    Comment:    (NOTE) Pre diabetes:  5.7%-6.4%  Diabetes:              >6.4%  Glycemic control for   <7.0% adults with diabetes   10/05/2007 06:17 AM   Final   5.4 (NOTE)   The ADA recommends the following therapeutic goals for glycemic   control related to Hgb A1C measurement:   Goal of Therapy:   < 7.0% Hgb A1C   Action Suggested:  > 8.0% Hgb A1C   Ref:  Diabetes Care, 22, Suppl. 1, 1999    CBG: Recent Labs  Lab 10/30/20 1606 10/30/20 1937 10/30/20 2337 10/31/20 0331 10/31/20 0720  GLUCAP 137* 146* 146* 132* 114*    Past Medical History  He,  has a past medical history of Barrett esophagus, Dysphagia, Gastroparesis, GERD (gastroesophageal reflux disease), Hypercholesteremia, Hypertension, Iron deficiency anemia, Mental retardation, Schizophrenia (HCC), and Seizure (HCC).   Surgical History    Past Surgical History:  Procedure Laterality Date  . BIOPSY  10/21/2020   Procedure: BIOPSY;  Surgeon: Iva Boop, MD;  Location: [facility] ENDOSCOPY;  Service: Endoscopy;;  . CRANIOTOMY FOR CYST FENESTRATION    . ESOPHAGOGASTRODUODENOSCOPY N/A 01/09/2015   Procedure: ESOPHAGOGASTRODUODENOSCOPY (EGD);  Surgeon: Louis Meckel, MD;  Location: [facility] ENDOSCOPY;  Service: Endoscopy;  Laterality: N/A;  . ESOPHAGOGASTRODUODENOSCOPY N/A 10/26/2020   Procedure: ESOPHAGOGASTRODUODENOSCOPY (EGD);  Surgeon: Corliss Skains, MD;  Location: [facility] OR;  Service: Thoracic;  Laterality: N/A;  . ESOPHAGOGASTRODUODENOSCOPY (EGD) WITH PROPOFOL N/A 10/21/2020   Procedure: ESOPHAGOGASTRODUODENOSCOPY (EGD) WITH PROPOFOL;  Surgeon: Iva Boop, MD;  Location[facility] ENDOSCOPY;  Service: Endoscopy;  Laterality: N/A;  . PEG PLACEMENT N/A 10/26/2020   Procedure: PERCUTANEOUS ENDOSCOPIC GASTROSTOMY (PEG) PLACEMENT;  Surgeon: Corliss Skains, MD;  Location: [facility] OR;  Service: Thoracic;  Laterality: N/A;  . XI ROBOTIC ASSISTED HIATAL  HERNIA REPAIR N/A 10/26/2020   Procedure: XI ROBOTIC ASSISTED HIATAL HERNIA REPAIR;  Surgeon: Corliss Skains, MD;  Location: [facility] OR;  Service: Thoracic;  Laterality: N/A;     Social History   reports that he has never smoked. He has never used smokeless tobacco. He reports that he does not drink alcohol and does not use drugs.   Family History   His family history is not on file.   Allergies Allergies  Allergen Reactions  . Vancomycin Other (See Comments)    Unknown-possible hives      Home Medications  Prior to Admission medications   Medication Sig Start Date End Date Taking? Authorizing Provider  benazepril (LOTENSIN) 20 MG tablet Take 20 mg by mouth daily.   Yes [provider]  Cholecalciferol (VITAMIN D) 2000 units tablet Take 2,000 Units by mouth daily.   Yes [provider]  divalproex (DEPAKOTE ER) 500 MG 24 hr tablet Take 500 mg by mouth daily.   Yes [provider]  esomeprazole (NEXIUM) 40 MG capsule Take 40 mg by mouth in the morning and at bedtime.   Yes [provider]  lamoTRIgine (LAMICTAL) 100 MG tablet Take 100 mg by mouth daily.   Yes [provider]  levothyroxine (SYNTHROID, LEVOTHROID) 75 MCG tablet Take 75 mcg by mouth daily.   Yes [provider]  metoCLOPramide (REGLAN) 5 MG tablet Take 1 tablet (5 mg total) by mouth 3 (three) times daily before meals. Patient taking differently: Take 5 mg by mouth in the morning and at bedtime.  07/27/15  Yes Regalado, Belkys A, MD  propranolol (INDERAL) 10 MG tablet Take 10 mg by mouth daily.  03/31/18  Yes [provider]  QUEtiapine (SEROQUEL) 400 MG tablet Take 400 mg by mouth 2 (two) times daily.    Yes [provider]  rosuvastatin (CRESTOR) 20 MG tablet Take 20 mg by mouth daily.   Yes [provider]    Quincy Simmonds PGY-1 Internal Medicine 10/31/20 8:15 AM Pager [phone]

## 2020-10-31 NOTE — Progress Notes (Signed)
eLink Physician-Brief Progress Note Patient Name: [name] DOB: 03/24/1956 MRN: [id number]   Date of Service  10/31/2020  HPI/Events of Note  Patient remains on the ventilator and needs his restraint orders renewed.  eICU Interventions  Restraint orders renewed.        Thomasene Lot Lola Lofaro 10/31/2020, 12:18 AM

## 2020-10-31 NOTE — Progress Notes (Signed)
Patient sister [name] call and communicated with the nurse that she would like to be notify by the doctor today for his brother plan of care.  Gerald Abbe DO, and  Resident MD. Zettie Cooley were notify.

## 2020-10-31 NOTE — Progress Notes (Signed)
Assisted tele visit to patient with family member.  Mccabe Gloria D Ricki Vanhandel, RN   

## 2020-10-31 NOTE — Progress Notes (Signed)
Attending note: I have seen and examined the patient. History, labs and imaging reviewed.  Interval History:  11/8: challenging sedation wean. Will attempt to change to precedex and start orals to wean continuous.     Labs reviewed, significant for hgb 7.6  Imaging: no new imaging.   Assessment/plan: Acute resp failure:  -sat/sbt as able -when sedation weaned pt is restless.  -on ps this am but 16/5  Large hiatal hernia Gerd, barrett's esophagus, gastroparesis -GI consulted -s/p repair and peg tube placement.   H/o sz d/o Schizophrenia -on home meds -adding po oxy -attempt to taper down propofol      The patient is critically ill with multiple organ systems failure and requires high complexity decision making for assessment and support, frequent evaluation and titration of therapies, application of advanced monitoring technologies and extensive interpretation of multiple databases.  Critical care time 38 mins. This represents my time independent of the NP's/PA's/med student/residents time taking care of the pt. This is excluding procedures   Briant Sites DO [facility] 10/31/2020, 3:53 PM

## 2020-11-01 LAB — CULTURE, RESPIRATORY W GRAM STAIN

## 2020-11-01 LAB — GLUCOSE, CAPILLARY
Glucose-Capillary: 120 mg/dL — ABNORMAL HIGH (ref 70–99)
Glucose-Capillary: 125 mg/dL — ABNORMAL HIGH (ref 70–99)
Glucose-Capillary: 162 mg/dL — ABNORMAL HIGH (ref 70–99)
Glucose-Capillary: 154 mg/dL — ABNORMAL HIGH (ref 70–99)
Glucose-Capillary: 158 mg/dL — ABNORMAL HIGH (ref 70–99)
Glucose-Capillary: 132 mg/dL — ABNORMAL HIGH (ref 70–99)

## 2020-11-01 LAB — SURGICAL PATHOLOGY

## 2020-11-01 MED ORDER — POLYETHYLENE GLYCOL 3350 17 G PO PACK
17.0000 g | PACK | Freq: Two times a day (BID) | ORAL | Status: DC
  Administered 2020-11-01 – 2020-11-14 (×23): 17 g
  Filled 2020-11-01 (×26): qty 1

## 2020-11-01 NOTE — Progress Notes (Signed)
   11/01/20 0737  Airway 7.5 mm  Placement Date/Time: 10/22/20 0503   Grade View: Grade 1  Placed By: ICU physician  Airway Device: Endotracheal Tube  Laryngoscope Blade: MAC;3  ETT Types: Oral  Size (mm): 7.5 mm  Cuffed: Cuffed  Insertion attempts: 1  Airway Equipment: Stylet;Video ...  Secured at (cm) 23 cm  Measured From Lips  Secured Location Right  Secured By Actuary Repositioned Yes  Prone position No  Cuff Pressure (cm H2O)  (mov)  Site Condition Dry  Adult Ventilator Settings  Vent Type Servo U  Humidity HME  Vent Mode PRVC  Vt Set 410 mL  Set Rate 20 bmp  FiO2 (%) 40 %  I Time 1 Sec(s)  PEEP 5 cmH20  Adult Ventilator Measurements  Peak Airway Pressure 26 L/min  Mean Airway Pressure 13 cmH20  Plateau Pressure 20 cmH20  Resp Rate Spontaneous 9 br/min  Resp Rate Total 29 br/min  Exhaled Vt 379 mL  Measured Ve 10.7 mL  I:E Ratio Measured 1:1  Auto PEEP 0 cmH20  Total PEEP 5 cmH20  SpO2 95 %  Adult Ventilator Alarms  Alarms On Y  Ve High Alarm 18 L/min  Ve Low Alarm 4 L/min  Resp Rate High Alarm 42 br/min  Resp Rate Low Alarm 8  PEEP Low Alarm 3 cmH2O  Press High Alarm 50 cmH2O  VAP Prevention  Cuff pressure (initial)  (mov)  HME changed No  Ventilator changed No  Transported while on vent No  HOB> 30 Degrees Y  Equipment wiped down Yes  Daily Weaning Assessment  Daily Assessment of Readiness to Wean Wean protocol criteria met (SBT performed)  SBT Method CPAP 5 cm H20 and PS 5 cm H20 (16/5)  Weaning Start Time [id number]  Patient response Failed SBT terminated  Reason SBT Terminated Excessive agitation, marked accessory muscle use, abdominal paradox, or diaphoresis noted;RR > 35 breaths/min or Frequency/TV ratio >105  Breath Sounds  Bilateral Breath Sounds Diminished  Airway Suctioning/Secretions  Suction Type ETT  Suction Device  Catheter  Secretion Amount Small  Secretion Color Tan  Secretion Consistency Thick  Suction  Tolerance Tolerated well  Suctioning Adverse Effects None

## 2020-11-01 NOTE — Progress Notes (Signed)
Called patient's sister [name] who is POA. Explained current difficulties with weaning with typical parameters given underlying psych history and frontal lobe encephalomalcia. Explained may need to stop sedation and attempt extubation despite agitation and possibility of hypoxia and respiratory failure if the patient fails extubation and possible need for reintubation. Sister discussed this matter with other siblings and is agreeable to trying extubation off sedation and would like the patient to be reintubated if necessary.

## 2020-11-01 NOTE — Progress Notes (Signed)
Assisted tele visit to patient with family member.  Caliegh Middlekauff D Dauntae Derusha, RN   

## 2020-11-01 NOTE — Progress Notes (Signed)
NAME:  [name], MRN:  [id number], DOB:  1956-04-30, LOS: 11 ADMISSION DATE:  10/20/2020, CONSULTATION DATE:  10/21/2020 REFERRING MD: Dr. Loney Loh, CHIEF COMPLAINT:  Dyspnea  Brief History   64 year old male presented to ED after syncopal episode and was found to have large hiatal hernia compressing trachea.   Past Medical History  GERD, Gastroparesis, Barrett's esophagus, HLD, HTN, Iron deficiency anemia, Seizures, Schizophrenia, Developmental delay  Significant Hospital Events   10/29 admit, EGD done 10/30 transferred to ICU for respiratory distress, intubated. 11/3 Hiatal hernia repair, g-tube  Consults:  CVTS GI  Procedures:  ETT 10/30 >> Lt [place] CVL 10/30 >>  Robotic assisted hiatal hernia repair 11/3 Peg placement 11/3 CXR 11/4 >> 2 cm right apical pneumothorax CXR 11/6 >> No residual pneumothorax Significant Diagnostic Tests:  CTA chest 10/29 > no PE, RUL ATX CT abd/pelvis 10/29  > large, stable gastric hernia with associated esophageal dilation and subsequent mass effect on the trachea.    Micro Data:  COVID 10/28 >> negative MRSA PCR 10/28 >> negative Blood 10/28 >> negative Sputum 10/30 >> negative  Antimicrobials:  Unasyn 10/28 >> 11/1  Interim history/subjective:  Currently on PRVC today. Became tqchypneic and desaturated on PS 16/5 yesterday. Still quite sedated.   Objective   Blood pressure 111/63, pulse (!) 59, temperature 98.4 F (36.9 C), temperature source Oral, resp. rate 20, height 5\' 4"  (1.626 m), weight 82 kg, SpO2 96 %.    Vent Mode: PRVC FiO2 (%):  [40 %] 40 % Set Rate:  [20 bmp] 20 bmp Vt Set:  [410 mL] 410 mL PEEP:  [5 cmH20] 5 cmH20 Pressure Support:  [16 cmH20] 16 cmH20 Plateau Pressure:  [21 cmH20-23 cmH20] 23 cmH20   Intake/Output Summary (Last 24 hours) at 11/01/2020 0730 Last data filed at 11/01/2020 0600 Gross per 24 hour  Intake 1231.88 ml  Output 555 ml  Net 676.88 ml   Filed Weights   10/29/20 0500 10/31/20  0426 11/01/20 0417  Weight: 81 kg 79.3 kg 82 kg   Examination: General: Resting comfortably, no acute distress HENT: ETT in place Lungs: clear to ascultation anteriorly Cardiovascular: normal S1 and S2, regular rate and rhythm, no  murmur Abdomen: Mildly istended, Peg tube in place, soft, non tender, + bs Extremities: warm, dry, 2+ edema bilaterally Neuro: Rass -1 Skin: surgical incisions clean and dry no bleeding   Resolved Hospital Problem list   Circulatory shock  Assessment & Plan:   Acute hypoxic/hypercapnic respiratory failure 2nd to aspiration pneumonia. - Has been difficult to wean off vent due to sedation and mental status - Continue to titrate down propofol and fentanyl  - Continue to increase on pecedex  and oxycodone 5 mg  - Will attempt to extubate today once less sedated - Will discuss with family possible need for intubation should he become hypoxic after extubation and their wishes for the patient should   Large hiatal hernia. GERD, Barrett's esophagus, gastroparesis. - seen by Dr. 13/09/21 from thoracic surgery, EGD completed with esophageal mass ruled out - sp hiatal hernia repair and peg tube on 11/3, tolerated well without complications  Anemia of critical illness. - Continue to monitor CBCs - transfuse for Hb < 7 or significant bleeding  Acute metabolic encephalopathy 2nd to sepsis, hypoxia, hypercapnia.  Seizure disorder, Schizophrenia. - RASS goal 0 to -1 - Will try to tritrate down on propofol and fentanyl - Continue home valproic acid 250 mg BID, quetiapine 400mg  BID,  Lamotrigine  100 mg daily  Hypothyroidism. - Synthroid  Hyperglycemia -SSI   Best practice:  Diet: Tube feeds  DVT prophylaxis: lovenox GI prophylaxis: protonix Mobility: bed rest Code Status: full Disposition: ICU  Labs   CBC: Recent Labs  Lab 10/26/20 0330 10/26/20 0330 10/26/20 1659 10/26/20 1827 10/27/20 0349 10/29/20 0351 10/30/20 0207  WBC 10.2  --   --    --  9.4 9.1 9.7  NEUTROABS  --   --   --   --  6.7  --   --   HGB 7.2*   < > 8.8* 9.5* 9.0* 8.2* 7.6*  HCT 24.6*   < > 26.0* 28.0* 29.6* 27.1* 25.6*  MCV 84.5  --   --   --  84.8 85.8 86.8  PLT 271  --   --   --  239 261 290   < > = values in this interval not displayed.    Basic Metabolic Panel: Recent Labs  Lab 10/25/20 1552 10/26/20 0330 10/27/20 0349 10/28/20 0443 10/29/20 0351 10/29/20 1217 10/30/20 0207  NA  --    < > 140 138 138 138 138  K  --    < > 3.7 3.7 3.4* 4.8 4.2  CL  --    < > 107 103 102 103 104  CO2  --    < > 23 27 27 26 26   GLUCOSE  --    < > 117* 162* 145* 152* 164*  BUN  --    < > 16 15 27* 25* 20  CREATININE  --    < > 0.98 1.16 1.25* 1.32* 1.04  CALCIUM  --    < > 7.8* 8.1* 7.7* 7.7* 7.7*  MG 2.4  --  2.2  --   --   --   --   PHOS 2.6  --   --   --   --   --   --    < > = values in this interval not displayed.   GFR: Estimated Creatinine Clearance: 71.1 mL/min (by C-G formula based on SCr of 1.04 mg/dL). Recent Labs  Lab 10/26/20 0330 10/27/20 0349 10/29/20 0351 10/30/20 0207  WBC 10.2 9.4 9.1 9.7    Liver Function Tests: No results for input(s): AST, ALT, ALKPHOS, BILITOT, PROT, ALBUMIN in the last 168 hours. No results for input(s): LIPASE, AMYLASE in the last 168 hours. No results for input(s): AMMONIA in the last 168 hours.  ABG    Component Value Date/Time   PHART 7.297 (L) 10/26/2020 1827   PCO2ART 49.8 (H) 10/26/2020 1827   PO2ART 87 10/26/2020 1827   HCO3 24.5 10/26/2020 1827   TCO2 26 10/26/2020 1827   ACIDBASEDEF 2.0 10/26/2020 1827   O2SAT 96.0 10/26/2020 1827     Coagulation Profile: No results for input(s): INR, PROTIME in the last 168 hours.  Cardiac Enzymes: No results for input(s): CKTOTAL, CKMB, CKMBINDEX, TROPONINI in the last 168 hours.  HbA1C: Hgb A1c MFr Bld  Date/Time Value Ref Range Status  10/22/2020 03:46 AM 5.8 (H) 4.8 - 5.6 % Final    Comment:    (NOTE) Pre diabetes:           5.7%-6.4%  Diabetes:              >6.4%  Glycemic control for   <7.0% adults with diabetes   10/05/2007 06:17 AM   Final   5.4 (NOTE)   The ADA recommends the following therapeutic goals for glycemic  control related to Hgb A1C measurement:   Goal of Therapy:   < 7.0% Hgb A1C   Action Suggested:  > 8.0% Hgb A1C   Ref:  Diabetes Care, 22, Suppl. 1, 1999    CBG: Recent Labs  Lab 10/31/20 1127 10/31/20 1527 10/31/20 1927 10/31/20 2326 11/01/20 0317  GLUCAP 160* 178* 150* 165* 120*    Past Medical History  He,  has a past medical history of Barrett esophagus, Dysphagia, Gastroparesis, GERD (gastroesophageal reflux disease), Hypercholesteremia, Hypertension, Iron deficiency anemia, Mental retardation, Schizophrenia (HCC), and Seizure (HCC).   Surgical History    Past Surgical History:  Procedure Laterality Date  . BIOPSY  10/21/2020   Procedure: BIOPSY;  Surgeon: Iva Boop, MD;  Location: [facility] [place] ENDOSCOPY;  Service: Endoscopy;;  . CRANIOTOMY FOR CYST FENESTRATION    . ESOPHAGOGASTRODUODENOSCOPY N/A 01/09/2015   Procedure: ESOPHAGOGASTRODUODENOSCOPY (EGD);  Surgeon: Louis Meckel, MD;  Location: [facility] ENDOSCOPY;  Service: Endoscopy;  Laterality: N/A;  . ESOPHAGOGASTRODUODENOSCOPY N/A 10/26/2020   Procedure: ESOPHAGOGASTRODUODENOSCOPY (EGD);  Surgeon: Corliss Skains, MD;  Location: [facility] OR;  Service: Thoracic;  Laterality: N/A;  . ESOPHAGOGASTRODUODENOSCOPY (EGD) WITH PROPOFOL N/A 10/21/2020   Procedure: ESOPHAGOGASTRODUODENOSCOPY (EGD) WITH PROPOFOL;  Surgeon: Iva Boop, MD;  Location: [facility] ENDOSCOPY;  Service: Endoscopy;  Laterality: N/A;  . PEG PLACEMENT N/A 10/26/2020   Procedure: PERCUTANEOUS ENDOSCOPIC GASTROSTOMY (PEG) PLACEMENT;  Surgeon: Corliss Skains, MD;  Location: [facility] OR;  Service: Thoracic;  Laterality: N/A;  . XI ROBOTIC ASSISTED HIATAL HERNIA REPAIR N/A 10/26/2020   Procedure: XI ROBOTIC ASSISTED HIATAL HERNIA REPAIR;  Surgeon: Corliss Skains, MD;   Location: [facility] OR;  Service: Thoracic;  Laterality: N/A;     Social History   reports that he has never smoked. He has never used smokeless tobacco. He reports that he does not drink alcohol and does not use drugs.   Family History   His family history is not on file.   Allergies Allergies  Allergen Reactions  . Vancomycin Other (See Comments)    Unknown-possible hives      Home Medications  Prior to Admission medications   Medication Sig Start Date End Date Taking? Authorizing Provider  benazepril (LOTENSIN) 20 MG tablet Take 20 mg by mouth daily.   Yes [provider]  Cholecalciferol (VITAMIN D) 2000 units tablet Take 2,000 Units by mouth daily.   Yes [provider]  divalproex (DEPAKOTE ER) 500 MG 24 hr tablet Take 500 mg by mouth daily.   Yes [provider]  esomeprazole (NEXIUM) 40 MG capsule Take 40 mg by mouth in the morning and at bedtime.   Yes [provider]  lamoTRIgine (LAMICTAL) 100 MG tablet Take 100 mg by mouth daily.   Yes [provider]  levothyroxine (SYNTHROID, LEVOTHROID) 75 MCG tablet Take 75 mcg by mouth daily.   Yes [provider]  metoCLOPramide (REGLAN) 5 MG tablet Take 1 tablet (5 mg total) by mouth 3 (three) times daily before meals. Patient taking differently: Take 5 mg by mouth in the morning and at bedtime.  07/27/15  Yes Regalado, Belkys A, MD  propranolol (INDERAL) 10 MG tablet Take 10 mg by mouth daily. 03/31/18  Yes [provider]  QUEtiapine (SEROQUEL) 400 MG tablet Take 400 mg by mouth 2 (two) times daily.    Yes [provider]  rosuvastatin (CRESTOR) 20 MG tablet Take 20 mg by mouth daily.   Yes [provider]    Quincy Simmonds PGY-1  Internal Medicine 11/01/20 8:15 AM Pager [phone]

## 2020-11-01 NOTE — Progress Notes (Signed)
eLink Physician-Brief Progress Note Patient Name: [name] DOB: [birth year] MRN: [id number]   Date of Service  11/01/2020  HPI/Events of Note  Patient  needs restraint orders renewed, he remains on the ventilator.  eICU Interventions  Restraint order renewed.        Thomasene Lot Randilyn Foisy 11/01/2020, 8:45 PM

## 2020-11-02 ENCOUNTER — Inpatient Hospital Stay (HOSPITAL_COMMUNITY): Payer: Medicare Other

## 2020-11-02 LAB — CBC
HCT: 27.6 % — ABNORMAL LOW (ref 39.0–52.0)
Hemoglobin: 8.1 g/dL — ABNORMAL LOW (ref 13.0–17.0)
MCH: 25.8 pg — ABNORMAL LOW (ref 26.0–34.0)
MCHC: 29.3 g/dL — ABNORMAL LOW (ref 30.0–36.0)
MCV: 87.9 fL (ref 80.0–100.0)
Platelets: 364 10*3/uL (ref 150–400)
RBC: 3.14 MIL/uL — ABNORMAL LOW (ref 4.22–5.81)
RDW: 18.2 % — ABNORMAL HIGH (ref 11.5–15.5)
WBC: 12.5 10*3/uL — ABNORMAL HIGH (ref 4.0–10.5)
nRBC: 0.3 % — ABNORMAL HIGH (ref 0.0–0.2)

## 2020-11-02 LAB — COMPREHENSIVE METABOLIC PANEL
ALT: 72 U/L — ABNORMAL HIGH (ref 0–44)
AST: 85 U/L — ABNORMAL HIGH (ref 15–41)
Albumin: 1.9 g/dL — ABNORMAL LOW (ref 3.5–5.0)
Alkaline Phosphatase: 226 U/L — ABNORMAL HIGH (ref 38–126)
Anion gap: 7 (ref 5–15)
BUN: 20 mg/dL (ref 8–23)
CO2: 29 mmol/L (ref 22–32)
Calcium: 8.4 mg/dL — ABNORMAL LOW (ref 8.9–10.3)
Chloride: 98 mmol/L (ref 98–111)
Creatinine, Ser: 1.07 mg/dL (ref 0.61–1.24)
GFR, Estimated: >60 mL/min (ref >60)
Glucose, Bld: 189 mg/dL — ABNORMAL HIGH (ref 70–99)
Potassium: 4.5 mmol/L (ref 3.5–5.1)
Sodium: 134 mmol/L — ABNORMAL LOW (ref 135–145)
Total Bilirubin: 0.6 mg/dL (ref 0.3–1.2)
Total Protein: 6.6 g/dL (ref 6.5–8.1)

## 2020-11-02 LAB — GLUCOSE, CAPILLARY
Glucose-Capillary: 184 mg/dL — ABNORMAL HIGH (ref 70–99)
Glucose-Capillary: 167 mg/dL — ABNORMAL HIGH (ref 70–99)
Glucose-Capillary: 213 mg/dL — ABNORMAL HIGH (ref 70–99)
Glucose-Capillary: 135 mg/dL — ABNORMAL HIGH (ref 70–99)
Glucose-Capillary: 114 mg/dL — ABNORMAL HIGH (ref 70–99)
Glucose-Capillary: 164 mg/dL — ABNORMAL HIGH (ref 70–99)

## 2020-11-02 LAB — CBC WITH DIFFERENTIAL/PLATELET
Abs Immature Granulocytes: 0.16 10*3/uL — ABNORMAL HIGH (ref 0.00–0.07)
Basophils Absolute: 0.1 10*3/uL (ref 0.0–0.1)
Basophils Relative: 0 %
Eosinophils Absolute: 0.1 10*3/uL (ref 0.0–0.5)
Eosinophils Relative: 0 %
HCT: 27.4 % — ABNORMAL LOW (ref 39.0–52.0)
Hemoglobin: 8.2 g/dL — ABNORMAL LOW (ref 13.0–17.0)
Immature Granulocytes: 1 %
Lymphocytes Relative: 12 %
Lymphs Abs: 1.4 10*3/uL (ref 0.7–4.0)
MCH: 26.1 pg (ref 26.0–34.0)
MCHC: 29.9 g/dL — ABNORMAL LOW (ref 30.0–36.0)
MCV: 87.3 fL (ref 80.0–100.0)
Monocytes Absolute: 0.9 10*3/uL (ref 0.1–1.0)
Monocytes Relative: 8 %
Neutro Abs: 9.4 10*3/uL — ABNORMAL HIGH (ref 1.7–7.7)
Neutrophils Relative %: 79 %
Platelets: 400 10*3/uL (ref 150–400)
RBC: 3.14 MIL/uL — ABNORMAL LOW (ref 4.22–5.81)
RDW: 18.1 % — ABNORMAL HIGH (ref 11.5–15.5)
WBC: 12 10*3/uL — ABNORMAL HIGH (ref 4.0–10.5)
nRBC: 0.5 % — ABNORMAL HIGH (ref 0.0–0.2)

## 2020-11-02 LAB — PHOSPHORUS: Phosphorus: 4.7 mg/dL — ABNORMAL HIGH (ref 2.5–4.6)

## 2020-11-02 LAB — MAGNESIUM: Magnesium: 2.1 mg/dL (ref 1.7–2.4)

## 2020-11-02 LAB — VALPROIC ACID LEVEL: Valproic Acid Lvl: 22 ug/mL — ABNORMAL LOW (ref 50.0–100.0)

## 2020-11-02 MED ORDER — SULFAMETHOXAZOLE-TRIMETHOPRIM 200-40 MG/5ML PO SUSP
20.0000 mL | Freq: Two times a day (BID) | ORAL | Status: DC
  Administered 2020-11-02: 20 mL via ORAL
  Filled 2020-11-02 (×2): qty 20

## 2020-11-02 MED ORDER — OXYCODONE HCL 5 MG/5ML PO SOLN
7.5000 mg | Freq: Four times a day (QID) | ORAL | Status: DC
  Administered 2020-11-02 – 2020-11-10 (×28): 7.5 mg
  Filled 2020-11-02 (×30): qty 10

## 2020-11-02 MED ORDER — PROSOURCE TF PO LIQD
45.0000 mL | Freq: Every day | ORAL | Status: DC
  Filled 2020-11-02: qty 45

## 2020-11-02 MED ORDER — SULFAMETHOXAZOLE-TRIMETHOPRIM 200-40 MG/5ML PO SUSP
20.0000 mL | Freq: Two times a day (BID) | ORAL | Status: AC
  Administered 2020-11-02 – 2020-11-07 (×10): 20 mL
  Filled 2020-11-02 (×11): qty 20

## 2020-11-02 NOTE — Progress Notes (Signed)
NAME:  [name], MRN:  [id number], DOB:  1956/09/27, LOS: 12 ADMISSION DATE:  10/20/2020, CONSULTATION DATE:  10/21/2020 REFERRING MD: Dr. Loney Loh, CHIEF COMPLAINT:  Dyspnea  Brief History   64 year old male presented to ED after syncopal episode and was found to have large hiatal hernia compressing trachea.   Past Medical History  GERD, Gastroparesis, Barrett's esophagus, HLD, HTN, Iron deficiency anemia, Seizures, Schizophrenia, Developmental delay  Significant Hospital Events   10/29 admit, EGD done 10/30 transferred to ICU for respiratory distress, intubated. 11/3 Hiatal hernia repair, g-tube  Consults:  CVTS GI Procedures:  ETT 10/30 >> Lt [place] CVL 10/30 >>  Robotic assisted hiatal hernia repair 11/3 Peg placement 11/3 CXR 11/4 >> 2 cm right apical pneumothorax CXR 11/6 >> No residual pneumothorax CXR 11/9 >> Bilateral opacities, pleural effusions  Significant Diagnostic Tests:  CTA chest 10/29 > no PE, RUL ATX CT abd/pelvis 10/29  > large, stable gastric hernia with associated esophageal dilation and subsequent mass effect on the trachea.    Micro Data:  COVID 10/28 >> negative MRSA PCR 10/28 >> negative Blood 10/28 >> negative Sputum 10/30 >> negative Tracheal aspirate 11/6 >> few klebsiella pneumoniae, citrobacter koseri Antimicrobials:  Unasyn 10/28 >> 11/1 Bactrim 11/10 >> Interim history/subjective:  No acute overnight events.  Off propofol. Afebrile overnight.   Objective   Blood pressure (!) 149/87, pulse 99, temperature 98.1 F (36.7 C), temperature source Axillary, resp. rate (!) 25, height 5\' 4"  (1.626 m), weight 82.3 kg, SpO2 95 %.    Vent Mode: PRVC FiO2 (%):  [40 %-50 %] 40 % Set Rate:  [20 bmp] 20 bmp Vt Set:  [410 mL] 410 mL PEEP:  [5 cmH20] 5 cmH20 Plateau Pressure:  [22 cmH20-29 cmH20] 29 cmH20   Intake/Output Summary (Last 24 hours) at 11/02/2020 0815 Last data filed at 11/02/2020 0800 Gross per 24 hour  Intake 2075.56 ml    Output 540 ml  Net 1535.56 ml   Filed Weights   10/31/20 0426 11/01/20 0417 11/02/20 0200  Weight: 79.3 kg 82 kg 82.3 kg   Examination: General: Resting comfortably, no acute distress HENT: ETT in place Lungs: tachypneic , clear to ascultation anteriorly Cardiovascular: normal S1 and S2, regular rate and rhythm, no  murmur Abdomen:Dstended, tympanic, Peg tube in place, soft, non tender, + bs Extremities: warm, dry, 2+ edema bilaterally Neuro: Awake, opens eyes spontaneously, following commands Skin: surgical incisions clean and dry no bleeding   Resolved Hospital Problem list   Circulatory shock  Assessment & Plan:   Acute hypoxic/hypercapnic respiratory failure 2nd to aspiration pneumonia. Leukocytosis Ventilator associated pneumonia 2/2 klebsiella pneumonia and citrobacter koseri - Trachial aspirate from 11/6 positive for klebsiella pneumoniae and citrobacter koseri with elevated WBC of 12.5 - Start Bactrim twice daily - Has been difficult to wean off vent due to sedation and mental status - Continue to titrate down fentanyl  - Continue precedex and increase oxycodone to 7.5  Large hiatal hernia. GERD, Barrett's esophagus, gastroparesis. - seen by Dr. 13/6 from thoracic surgery, EGD completed with esophageal mass ruled out - sp hiatal hernia repair and peg tube on 11/3, tolerated well without complications  Anemia of critical illness. - Continue to monitor CBCs - transfuse for Hb < 7 or significant bleeding  Acute metabolic encephalopathy 2nd to sepsis, hypoxia, hypercapnia.  Seizure disorder, Schizophrenia. - RASS goal 0 to -1 - Will try to tritrate down on fentanyl, continue precedex and oxycodone - Continue home valproic acid  250 mg BID, quetiapine 400mg  BID,  Lamotrigine 100 mg daily  Hyponatremia -Na of 134 today, difficult to assess mental status but is more awake and alert today, continue to monitor -check valproic acid level   Hypothyroidism. -  Synthroid  Hyperglycemia -SSI   Best practice:  Diet: Tube feeds  DVT prophylaxis: lovenox GI prophylaxis: protonix Mobility: bed rest Code Status: full Disposition: ICU  Labs   CBC: Recent Labs  Lab 10/26/20 1827 10/27/20 0349 10/29/20 0351 10/30/20 0207 11/02/20 0428  WBC  --  9.4 9.1 9.7 12.5*  NEUTROABS  --  6.7  --   --   --   HGB 9.5* 9.0* 8.2* 7.6* 8.1*  HCT 28.0* 29.6* 27.1* 25.6* 27.6*  MCV  --  84.8 85.8 86.8 87.9  PLT  --  239 261 290 364    Basic Metabolic Panel: Recent Labs  Lab 10/27/20 0349 10/27/20 0349 10/28/20 0443 10/29/20 0351 10/29/20 1217 10/30/20 0207 11/02/20 0428  NA 140   < > 138 138 138 138 134*  K 3.7   < > 3.7 3.4* 4.8 4.2 4.5  CL 107   < > 103 102 103 104 98  CO2 23   < > 27 27 26 26 29   GLUCOSE 117*   < > 162* 145* 152* 164* 189*  BUN 16   < > 15 27* 25* 20 20  CREATININE 0.98   < > 1.16 1.25* 1.32* 1.04 1.07  CALCIUM 7.8*   < > 8.1* 7.7* 7.7* 7.7* 8.4*  MG 2.2  --   --   --   --   --  2.1  PHOS  --   --   --   --   --   --  4.7*   < > = values in this interval not displayed.   GFR: Estimated Creatinine Clearance: 69.3 mL/min (by C-G formula based on SCr of 1.07 mg/dL). Recent Labs  Lab 10/27/20 0349 10/29/20 0351 10/30/20 0207 11/02/20 0428  WBC 9.4 9.1 9.7 12.5*    Liver Function Tests: Recent Labs  Lab 11/02/20 0428  AST 85*  ALT 72*  ALKPHOS 226*  BILITOT 0.6  PROT 6.6  ALBUMIN 1.9*   No results for input(s): LIPASE, AMYLASE in the last 168 hours. No results for input(s): AMMONIA in the last 168 hours.  ABG    Component Value Date/Time   PHART 7.297 (L) 10/26/2020 1827   PCO2ART 49.8 (H) 10/26/2020 1827   PO2ART 87 10/26/2020 1827   HCO3 24.5 10/26/2020 1827   TCO2 26 10/26/2020 1827   ACIDBASEDEF 2.0 10/26/2020 1827   O2SAT 96.0 10/26/2020 1827     Coagulation Profile: No results for input(s): INR, PROTIME in the last 168 hours.  Cardiac Enzymes: No results for input(s): CKTOTAL, CKMB,  CKMBINDEX, TROPONINI in the last 168 hours.  HbA1C: Hgb A1c MFr Bld  Date/Time Value Ref Range Status  10/22/2020 03:46 AM 5.8 (H) 4.8 - 5.6 % Final    Comment:    (NOTE) Pre diabetes:          5.7%-6.4%  Diabetes:              >6.4%  Glycemic control for   <7.0% adults with diabetes   10/05/2007 06:17 AM   Final   5.4 (NOTE)   The ADA recommends the following therapeutic goals for glycemic   control related to Hgb A1C measurement:   Goal of Therapy:   < 7.0% Hgb A1C  Action Suggested:  > 8.0% Hgb A1C   Ref:  Diabetes Care, 22, Suppl. 1, 1999    CBG: Recent Labs  Lab 11/01/20 1527 11/01/20 1920 11/01/20 2318 11/02/20 0322 11/02/20 0734  GLUCAP 154* 158* 132* 184* 167*    Past Medical History  He,  has a past medical history of Barrett esophagus, Dysphagia, Gastroparesis, GERD (gastroesophageal reflux disease), Hypercholesteremia, Hypertension, Iron deficiency anemia, Mental retardation, Schizophrenia (HCC), and Seizure (HCC).   Surgical History    Past Surgical History:  Procedure Laterality Date  . BIOPSY  10/21/2020   Procedure: BIOPSY;  Surgeon: Iva Boop, MD;  Location: [facility] ENDOSCOPY;  Service: Endoscopy;;  . CRANIOTOMY FOR CYST FENESTRATION    . ESOPHAGOGASTRODUODENOSCOPY N/A 01/09/2015   Procedure: ESOPHAGOGASTRODUODENOSCOPY (EGD);  Surgeon: Louis Meckel, MD;  Location: [facility] ENDOSCOPY;  Service: Endoscopy;  Laterality: N/A;  . ESOPHAGOGASTRODUODENOSCOPY N/A 10/26/2020   Procedure: ESOPHAGOGASTRODUODENOSCOPY (EGD);  Surgeon: Corliss Skains, MD;  Location: [facility] OR;  Service: Thoracic;  Laterality: N/A;  . ESOPHAGOGASTRODUODENOSCOPY (EGD) WITH PROPOFOL N/A 10/21/2020   Procedure: ESOPHAGOGASTRODUODENOSCOPY (EGD) WITH PROPOFOL;  Surgeon: Iva Boop, MD;  Location: [facility] ENDOSCOPY;  Service: Endoscopy;  Laterality: N/A;  . PEG PLACEMENT N/A 10/26/2020   Procedure: PERCUTANEOUS ENDOSCOPIC GASTROSTOMY (PEG) PLACEMENT;  Surgeon: Corliss Skains, MD;   Location: [facility] OR;  Service: Thoracic;  Laterality: N/A;  . XI ROBOTIC ASSISTED HIATAL HERNIA REPAIR N/A 10/26/2020   Procedure: XI ROBOTIC ASSISTED HIATAL HERNIA REPAIR;  Surgeon: Corliss Skains, MD;  Location: [facility] OR;  Service: Thoracic;  Laterality: N/A;     Social History   reports that he has never smoked. He has never used smokeless tobacco. He reports that he does not drink alcohol and does not use drugs.   Family History   His family history is not on file.   Allergies Allergies  Allergen Reactions  . Vancomycin Other (See Comments)    Unknown-possible hives      Home Medications  Prior to Admission medications   Medication Sig Start Date End Date Taking? Authorizing Provider  benazepril (LOTENSIN) 20 MG tablet Take 20 mg by mouth daily.   Yes [provider]  Cholecalciferol (VITAMIN D) 2000 units tablet Take 2,000 Units by mouth daily.   Yes [provider]  divalproex (DEPAKOTE ER) 500 MG 24 hr tablet Take 500 mg by mouth daily.   Yes [provider]  esomeprazole (NEXIUM) 40 MG capsule Take 40 mg by mouth in the morning and at bedtime.   Yes [provider]  lamoTRIgine (LAMICTAL) 100 MG tablet Take 100 mg by mouth daily.   Yes [provider]  levothyroxine (SYNTHROID, LEVOTHROID) 75 MCG tablet Take 75 mcg by mouth daily.   Yes [provider]  metoCLOPramide (REGLAN) 5 MG tablet Take 1 tablet (5 mg total) by mouth 3 (three) times daily before meals. Patient taking differently: Take 5 mg by mouth in the morning and at bedtime.  07/27/15  Yes Regalado, Belkys A, MD  propranolol (INDERAL) 10 MG tablet Take 10 mg by mouth daily. 03/31/18  Yes [provider]  QUEtiapine (SEROQUEL) 400 MG tablet Take 400 mg by mouth 2 (two) times daily.    Yes [provider]  rosuvastatin (CRESTOR) 20 MG tablet Take 20 mg by mouth daily.   Yes [provider]    Quincy Simmonds PGY-1 Internal Medicine 11/02/20  8:15 AM Pager [phone]

## 2020-11-02 NOTE — Care Management (Signed)
Per [facility] liaison, Victorino Dike she spoke w MD re disposition to [employer]. CM placed consult. LT[facility] briefly explained by CSW to patient's sister. [facility] liaison to follow and speak with patient's sister in more detail closer to DC.

## 2020-11-02 NOTE — Progress Notes (Signed)
Nutrition Follow-up  DOCUMENTATION CODES:   Not applicable  INTERVENTION:   Continue tube feeds via PEG: Vital AF 1.2 at 60 ml/hr (1440 ml/day) Add Prosource TF 45 ml once daily  Provides 1768 kcal, 119 grams of protein, and 1168 ml of free water daily.  NUTRITION DIAGNOSIS:   Inadequate oral intake related to acute illness as evidenced by NPO status.  Ongoing  GOAL:   Patient will meet greater than or equal to 90% of their needs  Met with TF  MONITOR:   Vent status, Labs, Weight trends, TF tolerance  REASON FOR ASSESSMENT:   Ventilator, Consult Enteral/tube feeding initiation and management  ASSESSMENT:   64 year old male who presented to the ED on 10/28 from his group home after a syncopal episode. PMH of stroke with left-sided deficits and dysphagia, Barrett's esophagus, GERD, gastroparesis, HTN, HLD, iron deficiency anemia, intellectual disability, schizophrenia, seizure disorder, hypothyroidism. Pt was found to have a large hiatal hernia compressing his trachea along with gastric and esophageal distention.  Discussed patient in ICU rounds and with RN today. Difficulty with weaning sedation due to agitation.  Currently receiving Vital AF 1.2 at 60 ml/h via PEG, tolerating well to meet 100% of estimated nutrition needs.  Patient is currently intubated on ventilator support MV: 10.5 L/min Temp (24hrs), Avg:98.9 F (37.2 C), Min:98.1 F (36.7 C), Max:99.9 F (37.7 C)   Medications reviewed and include Colace, Novolog, protonix, Miralax, Precedex.  Labs reviewed: Sodium 134, Phos 4.7 CBG's: 184-167  UOP: 540 ml x 24 hours I/O's: +11.9 L since admit Weight up to 82.3 kg today from 77 kg on admission (10/28)  Diet Order:   Diet Order            Diet NPO time specified  Diet effective now                 EDUCATION NEEDS:   No education needs have been identified at this time  Skin:  Skin Assessment: Skin Integrity Issues: Incisions:  abdomen  Last BM:  11/10 type 6  Height:   Ht Readings from Last 1 Encounters:  10/27/20 _0  (1.626 m)    Weight:   Wt Readings from Last 1 Encounters:  11/02/20 82.3 kg    Ideal Body Weight:  59.1 kg  BMI:  Body mass index is 31.14 kg/m.  Estimated Nutritional Needs:   Kcal:  1770  Protein:  100-125 grams  Fluid:  >/= 1.7 L   Lucas Mallow, RD, LDN, CNSC Please refer to Amion for contact information.

## 2020-11-02 NOTE — Progress Notes (Signed)
CSW spoke with patient's sister [name] at [phone] - [name] confirms she is the patient's POA. [name] has been the patient's POA since 2012. [name] states the patient has lived at [facility] since 2012.  Edwin Dada, MSW, LCSW-A Transitions of Care  Clinical Social Worker  [facility] Emergency Departments  Medical ICU [phone]

## 2020-11-02 NOTE — Progress Notes (Signed)
After several hours of TF being held d/t abdominal distention, residual of 120 aspirated from stomach. Call placed to Elink to keep them updated. Will give medications and plan to restart TF at a slower rate in a couple hours and then recheck residual.   Noe Gens, RN

## 2020-11-03 DIAGNOSIS — J9601 Acute respiratory failure with hypoxia: Secondary | ICD-10-CM | POA: Diagnosis not present

## 2020-11-03 LAB — COMPREHENSIVE METABOLIC PANEL
ALT: 62 U/L — ABNORMAL HIGH (ref 0–44)
AST: 67 U/L — ABNORMAL HIGH (ref 15–41)
Albumin: 1.9 g/dL — ABNORMAL LOW (ref 3.5–5.0)
Alkaline Phosphatase: 214 U/L — ABNORMAL HIGH (ref 38–126)
Anion gap: 9 (ref 5–15)
BUN: 19 mg/dL (ref 8–23)
CO2: 29 mmol/L (ref 22–32)
Calcium: 8.2 mg/dL — ABNORMAL LOW (ref 8.9–10.3)
Chloride: 97 mmol/L — ABNORMAL LOW (ref 98–111)
Creatinine, Ser: 1.04 mg/dL (ref 0.61–1.24)
GFR, Estimated: >60 mL/min (ref >60)
Glucose, Bld: 142 mg/dL — ABNORMAL HIGH (ref 70–99)
Potassium: 4.3 mmol/L (ref 3.5–5.1)
Sodium: 135 mmol/L (ref 135–145)
Total Bilirubin: 0.4 mg/dL (ref 0.3–1.2)
Total Protein: 6.3 g/dL — ABNORMAL LOW (ref 6.5–8.1)

## 2020-11-03 LAB — GLUCOSE, CAPILLARY
Glucose-Capillary: 142 mg/dL — ABNORMAL HIGH (ref 70–99)
Glucose-Capillary: 115 mg/dL — ABNORMAL HIGH (ref 70–99)
Glucose-Capillary: 136 mg/dL — ABNORMAL HIGH (ref 70–99)
Glucose-Capillary: 125 mg/dL — ABNORMAL HIGH (ref 70–99)
Glucose-Capillary: 109 mg/dL — ABNORMAL HIGH (ref 70–99)
Glucose-Capillary: 120 mg/dL — ABNORMAL HIGH (ref 70–99)

## 2020-11-03 LAB — CBC
HCT: 27.5 % — ABNORMAL LOW (ref 39.0–52.0)
Hemoglobin: 8.2 g/dL — ABNORMAL LOW (ref 13.0–17.0)
MCH: 25.9 pg — ABNORMAL LOW (ref 26.0–34.0)
MCHC: 29.8 g/dL — ABNORMAL LOW (ref 30.0–36.0)
MCV: 87 fL (ref 80.0–100.0)
Platelets: 374 10*3/uL (ref 150–400)
RBC: 3.16 MIL/uL — ABNORMAL LOW (ref 4.22–5.81)
RDW: 18.4 % — ABNORMAL HIGH (ref 11.5–15.5)
WBC: 11.1 10*3/uL — ABNORMAL HIGH (ref 4.0–10.5)
nRBC: 0.4 % — ABNORMAL HIGH (ref 0.0–0.2)

## 2020-11-03 MED ORDER — FUROSEMIDE 10 MG/ML IJ SOLN
40.0000 mg | Freq: Once | INTRAMUSCULAR | Status: AC
  Administered 2020-11-03: 40 mg via INTRAVENOUS
  Filled 2020-11-03: qty 4

## 2020-11-03 MED ORDER — DEXMEDETOMIDINE HCL IN NACL 400 MCG/100ML IV SOLN
0.0000 ug/kg/h | INTRAVENOUS | Status: DC
  Administered 2020-11-03 (×2): 1.2 ug/kg/h via INTRAVENOUS
  Filled 2020-11-03 (×5): qty 100

## 2020-11-03 MED ORDER — METOCLOPRAMIDE HCL 5 MG/ML IJ SOLN
10.0000 mg | Freq: Three times a day (TID) | INTRAMUSCULAR | Status: AC
  Administered 2020-11-03 – 2020-11-04 (×6): 10 mg via INTRAVENOUS
  Filled 2020-11-03 (×6): qty 2

## 2020-11-03 MED ORDER — CHLORHEXIDINE GLUCONATE CLOTH 2 % EX PADS
6.0000 | MEDICATED_PAD | Freq: Every day | CUTANEOUS | Status: DC
  Administered 2020-11-03 – 2020-11-15 (×12): 6 via TOPICAL

## 2020-11-03 NOTE — Progress Notes (Signed)
eLink Physician-Brief Progress Note Patient Name: [name] DOB: 11-22-1956 MRN: [id number]   Date of Service  11/03/2020  HPI/Events of Note  Enteral feed residuals of 450 ml going at a rate of 20 ml / hour.  eICU Interventions  Hold enteral feed until reviewed by daytime PCCM attending this a.m. Reglan 10 mg iv Q 8 hours x 6 doses.        Thomasene Lot Alley Neils 11/03/2020, 6:08 AM

## 2020-11-03 NOTE — Progress Notes (Signed)
TF restarted at 0130 at 20/hr. At 0530 residual checked and greater than 450 in stomach. Elink notified that TF were again stopped. Will continue to monitor patient.   Noe Gens, RN

## 2020-11-03 NOTE — Progress Notes (Signed)
NAME:  [name], MRN:  [id number], DOB:  1956-07-18, LOS: 13 ADMISSION DATE:  10/20/2020, CONSULTATION DATE:  10/21/2020 REFERRING MD: Dr. Loney Lohathore, CHIEF COMPLAINT:  Dyspnea  Brief History   64 year old male presented to ED after syncopal episode and was found to have large hiatal hernia compressing trachea.   Past Medical History  GERD, Gastroparesis, Barrett's esophagus, HLD, HTN, Iron deficiency anemia, Seizures, Schizophrenia, Developmental delay  Significant Hospital Events   10/29 admit, EGD done 10/30 transferred to ICU for respiratory distress, intubated. 11/3 Hiatal hernia repair, g-tube  Consults:  CVTS GI Procedures:  ETT 10/30 >> Lt [place] CVL 10/30 >>  Robotic assisted hiatal hernia repair 11/3 Peg placement 11/3 CXR 11/4 >> 2 cm right apical pneumothorax CXR 11/6 >> No residual pneumothorax CXR 11/9 >> Bilateral opacities, pleural effusions  Significant Diagnostic Tests:  CTA chest 10/29 > no PE, RUL ATX CT abd/pelvis 10/29  > large, stable gastric hernia with associated esophageal dilation and subsequent mass effect on the trachea.    Micro Data:  COVID 10/28 >> negative MRSA PCR 10/28 >> negative Blood 10/28 >> negative Sputum 10/30 >> negative Tracheal aspirate 11/6 >> few klebsiella pneumoniae, citrobacter koseri Antimicrobials:  Unasyn 10/28 >> 11/1 Bactrim 11/10 >> Interim history/subjective:  Overnight tube feeds held and started on reglan due to increased residuals of 450 mL at 3220ml/hour and distention. Had one small bowel movement and enema with moderate amount of stool yesterday evening.   This morning he is awake not following commands. Continues have had abdominal distention.   Objective   Blood pressure (!) 150/80, pulse 63, temperature 98.5 F (36.9 C), temperature source Oral, resp. rate 20, height 5\' 4"  (1.626 m), weight 85.8 kg, SpO2 98 %.    Vent Mode: PRVC FiO2 (%):  [40 %-50 %] 50 % Set Rate:  [20 bmp] 20 bmp Vt Set:   [410 mL] 410 mL PEEP:  [5 cmH20] 5 cmH20 Plateau Pressure:  [21 cmH20-26 cmH20] 21 cmH20   Intake/Output Summary (Last 24 hours) at 11/03/2020 0723 Last data filed at 11/03/2020 0700 Gross per 24 hour  Intake 1800.14 ml  Output 1235 ml  Net 565.14 ml   Filed Weights   11/01/20 0417 11/02/20 0200 11/03/20 0407  Weight: 82 kg 82.3 kg 85.8 kg   Examination: General: Awake resting comfortably in bed, no acute distress HENT: ETT in place Lungs:clear to ascultation anteriorly Cardiovascular: normal S1 and S2, regular rate and rhythm, no  murmur Abdomen: Dstended, tympanic, Peg tube in place, diminished bs Extremities: warm, dry, 2+ edema bilaterally Neuro: Awake, opens eyes spontaneously, not following commands GU: Scrotal edema Skin: surgical incisions clean and dry no bleeding   Resolved Hospital Problem list   Circulatory shock  Assessment & Plan:   Acute hypoxic/hypercapnic respiratory failure 2nd to aspiration pneumonia. Leukocytosis Ventilator associated pneumonia 2/2 klebsiella pneumonia and citrobacter koseri - Trachial aspirate from 11/6 positive for klebsiella pneumoniae and citrobacter koseri with elevated WBC of 12.5 - Continue Bactrim twice daily on day 2/5 - Has been difficult to wean off vent due to sedation and mental status - Continue to titrate down fentanyl  - Continue precedex and increase oxycodone to 7.5 - IV lasix 40 mg today  Large hiatal hernia. GERD, Barrett's esophagus, gastroparesis. - seen by Dr. Cliffton AstersLightfoot from thoracic surgery, EGD completed with esophageal mass ruled out - sp hiatal hernia repair and peg tube on 11/3, tolerated well without complications - Abdominal distension. KUB showing gaseous distension  of stomach no free air - Continue to hold tube feeds, continue to give meds through tube - Continue reglan  Anemia of critical illness. - Continue to monitor CBCs - transfuse for Hb < 7 or significant bleeding  Acute metabolic  encephalopathy 2nd to sepsis, hypoxia, hypercapnia.  Seizure disorder, Schizophrenia. - RASS goal 0 to -1 - Will try to tritrate down on fentanyl, continue precedex and oxycodone - Continue home valproic acid 250 mg BID, quetiapine 400mg  BID,  Lamotrigine 100 mg daily  Scrotal Edema Likely due to volume overload and prolonged mobility  - Elevate scrotum on rolled towels  Hyponatremia -Improved today to Na of 135  - continue to monitor  Hypothyroidism. - Synthroid  Hyperglycemia -SSI   Best practice:  Diet: Hold tube fees, meds through tube DVT prophylaxis: lovenox GI prophylaxis: protonix Mobility: bed rest Code Status: full Disposition: ICU  Labs   CBC: Recent Labs  Lab 10/29/20 0351 10/30/20 0207 11/02/20 0428 11/02/20 1115 11/03/20 0403  WBC 9.1 9.7 12.5* 12.0* 11.1*  NEUTROABS  --   --   --  9.4*  --   HGB 8.2* 7.6* 8.1* 8.2* 8.2*  HCT 27.1* 25.6* 27.6* 27.4* 27.5*  MCV 85.8 86.8 87.9 87.3 87.0  PLT 261 290 364 400 374    Basic Metabolic Panel: Recent Labs  Lab 10/29/20 0351 10/29/20 1217 10/30/20 0207 11/02/20 0428 11/03/20 0403  NA 138 138 138 134* 135  K 3.4* 4.8 4.2 4.5 4.3  CL 102 103 104 98 97*  CO2 27 26 26 29 29   GLUCOSE 145* 152* 164* 189* 142*  BUN 27* 25* 20 20 19   CREATININE 1.25* 1.32* 1.04 1.07 1.04  CALCIUM 7.7* 7.7* 7.7* 8.4* 8.2*  MG  --   --   --  2.1  --   PHOS  --   --   --  4.7*  --    GFR: Estimated Creatinine Clearance: 72.7 mL/min (by C-G formula based on SCr of 1.04 mg/dL). Recent Labs  Lab 10/30/20 0207 11/02/20 0428 11/02/20 1115 11/03/20 0403  WBC 9.7 12.5* 12.0* 11.1*    Liver Function Tests: Recent Labs  Lab 11/02/20 0428 11/03/20 0403  AST 85* 67*  ALT 72* 62*  ALKPHOS 226* 214*  BILITOT 0.6 0.4  PROT 6.6 6.3*  ALBUMIN 1.9* 1.9*   No results for input(s): LIPASE, AMYLASE in the last 168 hours. No results for input(s): AMMONIA in the last 168 hours.  ABG    Component Value Date/Time   PHART  7.297 (L) 10/26/2020 1827   PCO2ART 49.8 (H) 10/26/2020 1827   PO2ART 87 10/26/2020 1827   HCO3 24.5 10/26/2020 1827   TCO2 26 10/26/2020 1827   ACIDBASEDEF 2.0 10/26/2020 1827   O2SAT 96.0 10/26/2020 1827     Coagulation Profile: No results for input(s): INR, PROTIME in the last 168 hours.  Cardiac Enzymes: No results for input(s): CKTOTAL, CKMB, CKMBINDEX, TROPONINI in the last 168 hours.  HbA1C: Hgb A1c MFr Bld  Date/Time Value Ref Range Status  10/22/2020 03:46 AM 5.8 (H) 4.8 - 5.6 % Final    Comment:    (NOTE) Pre diabetes:          5.7%-6.4%  Diabetes:              >6.4%  Glycemic control for   <7.0% adults with diabetes   10/05/2007 06:17 AM   Final   5.4 (NOTE)   The ADA recommends the following therapeutic goals for glycemic  control related to Hgb A1C measurement:   Goal of Therapy:   < 7.0% Hgb A1C   Action Suggested:  > 8.0% Hgb A1C   Ref:  Diabetes Care, 22, Suppl. 1, 1999    CBG: Recent Labs  Lab 11/02/20 1134 11/02/20 1517 11/02/20 1920 11/02/20 2321 11/03/20 0304  GLUCAP 213* 135* 114* 164* 142*    Past Medical History  He,  has a past medical history of Barrett esophagus, Dysphagia, Gastroparesis, GERD (gastroesophageal reflux disease), Hypercholesteremia, Hypertension, Iron deficiency anemia, Mental retardation, Schizophrenia (HCC), and Seizure (HCC).   Surgical History    Past Surgical History:  Procedure Laterality Date  . BIOPSY  10/21/2020   Procedure: BIOPSY;  Surgeon: Iva Boop, MD;  Location: [facility] ENDOSCOPY;  Service: Endoscopy;;  . CRANIOTOMY FOR CYST FENESTRATION    . ESOPHAGOGASTRODUODENOSCOPY N/A 01/09/2015   Procedure: ESOPHAGOGASTRODUODENOSCOPY (EGD);  Surgeon: Louis Meckel, MD;  Location: [facility] ENDOSCOPY;  Service: Endoscopy;  Laterality: N/A;  . ESOPHAGOGASTRODUODENOSCOPY N/A 10/26/2020   Procedure: ESOPHAGOGASTRODUODENOSCOPY (EGD);  Surgeon: Corliss Skains, MD;  Location: [facility] OR;  Service: Thoracic;  Laterality: N/A;    . ESOPHAGOGASTRODUODENOSCOPY (EGD) WITH PROPOFOL N/A 10/21/2020   Procedure: ESOPHAGOGASTRODUODENOSCOPY (EGD) WITH PROPOFOL;  Surgeon: Iva Boop, MD;  Location: [facility] ENDOSCOPY;  Service: Endoscopy;  Laterality: N/A;  . PEG PLACEMENT N/A 10/26/2020   Procedure: PERCUTANEOUS ENDOSCOPIC GASTROSTOMY (PEG) PLACEMENT;  Surgeon: Corliss Skains, MD;  Location: [facility] OR;  Service: Thoracic;  Laterality: N/A;  . XI ROBOTIC ASSISTED HIATAL HERNIA REPAIR N/A 10/26/2020   Procedure: XI ROBOTIC ASSISTED HIATAL HERNIA REPAIR;  Surgeon: Corliss Skains, MD;  Location: [facility] OR;  Service: Thoracic;  Laterality: N/A;     Social History   reports that he has never smoked. He has never used smokeless tobacco. He reports that he does not drink alcohol and does not use drugs.   Family History   His family history is not on file.   Allergies Allergies  Allergen Reactions  . Vancomycin Other (See Comments)    Unknown-possible hives      Home Medications  Prior to Admission medications   Medication Sig Start Date End Date Taking? Authorizing Provider  benazepril (LOTENSIN) 20 MG tablet Take 20 mg by mouth daily.   Yes [provider]  Cholecalciferol (VITAMIN D) 2000 units tablet Take 2,000 Units by mouth daily.   Yes [provider]  divalproex (DEPAKOTE ER) 500 MG 24 hr tablet Take 500 mg by mouth daily.   Yes [provider]  esomeprazole (NEXIUM) 40 MG capsule Take 40 mg by mouth in the morning and at bedtime.   Yes [provider]  lamoTRIgine (LAMICTAL) 100 MG tablet Take 100 mg by mouth daily.   Yes [provider]  levothyroxine (SYNTHROID, LEVOTHROID) 75 MCG tablet Take 75 mcg by mouth daily.   Yes [provider]  metoCLOPramide (REGLAN) 5 MG tablet Take 1 tablet (5 mg total) by mouth 3 (three) times daily before meals. Patient taking differently: Take 5 mg by mouth in the morning and at bedtime.  07/27/15  Yes Regalado, Belkys A, MD   propranolol (INDERAL) 10 MG tablet Take 10 mg by mouth daily. 03/31/18  Yes [provider]  QUEtiapine (SEROQUEL) 400 MG tablet Take 400 mg by mouth 2 (two) times daily.    Yes [provider]  rosuvastatin (CRESTOR) 20 MG tablet Take 20 mg by mouth daily.   Yes [provider]    Shanda Bumps  [facility] PGY-1 Internal Medicine 11/03/20 7:23 AM Pager [phone]

## 2020-11-04 ENCOUNTER — Inpatient Hospital Stay (HOSPITAL_COMMUNITY): Payer: Medicare Other

## 2020-11-04 DIAGNOSIS — J9601 Acute respiratory failure with hypoxia: Secondary | ICD-10-CM | POA: Diagnosis not present

## 2020-11-04 LAB — GLUCOSE, CAPILLARY
Glucose-Capillary: 125 mg/dL — ABNORMAL HIGH (ref 70–99)
Glucose-Capillary: 107 mg/dL — ABNORMAL HIGH (ref 70–99)
Glucose-Capillary: 120 mg/dL — ABNORMAL HIGH (ref 70–99)
Glucose-Capillary: 115 mg/dL — ABNORMAL HIGH (ref 70–99)
Glucose-Capillary: 131 mg/dL — ABNORMAL HIGH (ref 70–99)

## 2020-11-04 LAB — CBC
HCT: 27.6 % — ABNORMAL LOW (ref 39.0–52.0)
Hemoglobin: 8.5 g/dL — ABNORMAL LOW (ref 13.0–17.0)
MCH: 26.6 pg (ref 26.0–34.0)
MCHC: 30.8 g/dL (ref 30.0–36.0)
MCV: 86.5 fL (ref 80.0–100.0)
Platelets: 382 10*3/uL (ref 150–400)
RBC: 3.19 MIL/uL — ABNORMAL LOW (ref 4.22–5.81)
RDW: 18.4 % — ABNORMAL HIGH (ref 11.5–15.5)
WBC: 7.5 10*3/uL (ref 4.0–10.5)
nRBC: 0 % (ref 0.0–0.2)

## 2020-11-04 LAB — BASIC METABOLIC PANEL
Anion gap: 11 (ref 5–15)
BUN: 16 mg/dL (ref 8–23)
CO2: 26 mmol/L (ref 22–32)
Calcium: 8.6 mg/dL — ABNORMAL LOW (ref 8.9–10.3)
Chloride: 98 mmol/L (ref 98–111)
Creatinine, Ser: 1.15 mg/dL (ref 0.61–1.24)
GFR, Estimated: >60 mL/min (ref >60)
Glucose, Bld: 134 mg/dL — ABNORMAL HIGH (ref 70–99)
Potassium: 4.1 mmol/L (ref 3.5–5.1)
Sodium: 135 mmol/L (ref 135–145)

## 2020-11-04 LAB — MAGNESIUM: Magnesium: 2 mg/dL (ref 1.7–2.4)

## 2020-11-04 NOTE — Procedures (Signed)
Extubation Procedure Note  Patient Details:   Name: [name] DOB: Jun 14, 1956 MRN: [id number]   Airway Documentation:    Vent end date: 11/04/20 Vent end time: 0942   Evaluation  O2 sats: stable throughout Complications: No apparent complications Patient did tolerate procedure well. Bilateral Breath Sounds: Clear, Diminished   Yes   Pt extubated to 4L [place] per MD order. Pt had positive cuff leak noted prior to extubation. Pt able to speak and has a weak productive cough post extubation. Family at bedside encouraged to help him cough up secretions and use Yankauer to clear secretions.   Ermalinda Barrios M 11/04/2020, 10:14 AM

## 2020-11-04 NOTE — Progress Notes (Signed)
CSW met with patient's sister at bedside to obtain POA documentation to be uploaded into the patient's chart.   , MSW, LCSW-A Transitions of Care  Clinical Social Worker  [facility]  Medical ICU [phone]  

## 2020-11-04 NOTE — Progress Notes (Signed)
Nutrition Follow-up  DOCUMENTATION CODES:   Not applicable  INTERVENTION:   Recommend resume tube feeds via PEG: Change to Osmolite 1.2 at 25 ml/hr, advance by 10 ml every 4 hours to goal rate of 75 (1800 ml/day).  Provides 2160 kcal, 100 grams of protein, and 1476 ml of free water daily.  Would not add fiber or use a formula with fiber due to hx of gastroparesis.   NUTRITION DIAGNOSIS:   Inadequate oral intake related to acute illness as evidenced by NPO status.  Ongoing  GOAL:   Patient will meet greater than or equal to 90% of their needs  Unmet   MONITOR:   TF tolerance  REASON FOR ASSESSMENT:   Ventilator, Consult Enteral/tube feeding initiation and management  ASSESSMENT:   64 year old male who presented to the ED on 10/28 from his group home after a syncopal episode. PMH of stroke with left-sided deficits and dysphagia, Barrett's esophagus, GERD, gastroparesis, HTN, HLD, iron deficiency anemia, intellectual disability, schizophrenia, seizure disorder, hypothyroidism. Pt was found to have a large hiatal hernia compressing his trachea along with gastric and esophageal distention.  RD discussed patient in ICU rounds and with RN today. Patient was extubated this morning. TF is on hold due to abdominal distention. Patient has a hx of gastroparesis. PEG placed to gravity drainage yesterday with 750 ml total output x past 24 hours. PEG output has only been 150-300 ml since midnight.  Reglan added 11/11. BM x 2 documented in the past 24 hours.  UOP 2025 ml x 24 hours. I/O +10.9 L since admission.  Medications reviewed and include Colace, Novolog, Reglan, protonix, Miralax.  Labs reviewed. CBG's: 120-125-107  Weight 82.6 kg today; 77 kg on admission (10/28)  Diet Order:   Diet Order            Diet NPO time specified  Diet effective now                 EDUCATION NEEDS:   No education needs have been identified at this time  Skin:  Skin  Assessment: Skin Integrity Issues: Incisions: abdomen  Last BM:  11/12  Height:   Ht Readings from Last 1 Encounters:  10/27/20 5\' 4"  (1.626 m)    Weight:   Wt Readings from Last 1 Encounters:  11/04/20 82.6 kg    Ideal Body Weight:  59.1 kg  BMI:  Body mass index is 31.26 kg/m.  Estimated Nutritional Needs:   Kcal:  1900-2100  Protein:  100-110 gm  Fluid:  >/= 1.9 L   13/12/21, RD, LDN, CNSC Please refer to Amion for contact information.

## 2020-11-04 NOTE — Progress Notes (Signed)
NAME:  [name], MRN:  [id number], DOB:  16-Jul-1956, LOS: 14 ADMISSION DATE:  10/20/2020, CONSULTATION DATE:  10/21/2020 REFERRING MD: Dr. Loney Loh, CHIEF COMPLAINT:  Dyspnea  Brief History   64 year old male presented to ED after syncopal episode and was found to have large hiatal hernia compressing trachea.   Past Medical History  GERD, Gastroparesis, Barrett's esophagus, HLD, HTN, Iron deficiency anemia, Seizures, Schizophrenia, Developmental delay  Significant Hospital Events   10/29 admit, EGD done 10/30 transferred to ICU for respiratory distress, intubated. 11/3 Hiatal hernia repair, g-tube  Consults:  CVTS GI Procedures:  ETT 10/30 >> Lt [place] CVL 10/30 >>  Robotic assisted hiatal hernia repair 11/3 Peg placement 11/3 CXR 11/4 >> 2 cm right apical pneumothorax CXR 11/6 >> No residual pneumothorax CXR 11/9 >> Bilateral opacities, pleural effusions  Significant Diagnostic Tests:  CTA chest 10/29 > no PE, RUL ATX CT abd/pelvis 10/29  > large, stable gastric hernia with associated esophageal dilation and subsequent mass effect on the trachea.    Micro Data:  COVID 10/28 >> negative MRSA PCR 10/28 >> negative Blood 10/28 >> negative Sputum 10/30 >> negative Tracheal aspirate 11/6 >> few klebsiella pneumoniae, citrobacter koseri Antimicrobials:  Unasyn 10/28 >> 11/1 Bactrim 11/10 >> Interim history/subjective:  No acute overnight events. Tube feeds held yesterday. Peg to gravity bag with 700 ml of output. Moderate spontaneous BM yesterday evening.  This morning he is awake becomes tachypneic on 8/5 sbt this morning. Sister at bedside this morning to help reassure patient during SBT and extubation. Objective   Blood pressure (!) 151/74, pulse (!) 55, temperature 98.5 F (36.9 C), temperature source Axillary, resp. rate 19, height 5\' 4"  (1.626 m), weight 82.6 kg, SpO2 94 %.    Vent Mode: PRVC FiO2 (%):  [30 %-40 %] 30 % Set Rate:  [20 bmp] 20 bmp Vt Set:   [410 mL] 410 mL PEEP:  [5 cmH20] 5 cmH20 Plateau Pressure:  [20 cmH20-21 cmH20] 21 cmH20   Intake/Output Summary (Last 24 hours) at 11/04/2020 13/11/2020 Last data filed at 11/04/2020 0700 Gross per 24 hour  Intake 1189.54 ml  Output 2685 ml  Net -1495.46 ml   Filed Weights   11/02/20 0200 11/03/20 0407 11/04/20 0500  Weight: 82.3 kg 85.8 kg 82.6 kg   Examination: General: Awake agitated with sbt, NAD after switching back to PRSV HENT: ETT in place Lungs:clear to ascultation anteriorly, tachypnea on 8/5 Cardiovascular: normal S1 and S2, regular rate and rhythm, no  murmur Abdomen: Improved distension, peg to gravity bag with bilious output Extremities: warm, dry, 2+ edema bilaterally Neuro: Awake, opens eyes spontaneously, not following commands GU: Scrotal edema Skin: surgical incisions clean and dry no bleeding   Resolved Hospital Problem list   Circulatory shock  Assessment & Plan:   Acute hypoxic/hypercapnic respiratory failure 2nd to aspiration pneumonia. Leukocytosis Ventilator associated pneumonia 2/2 klebsiella pneumonia and citrobacter koseri - Trachial aspirate from 11/6 positive for klebsiella pneumoniae and citrobacter koseri leukocytosis now resolved - Continue Bactrim twice daily on day 3/5 - Has been difficult to wean off vent due to sedation and mental status - Titrate down fentanyl and plan to extubate today with family at bedside to provide comfort and reassurance, family agreeable with pursing trach if patient does not tolerate extubation  - Continue precedex and oxycodone  Large hiatal hernia. GERD, Barrett's esophagus, gastroparesis. - seen by Dr. 13/6 from thoracic surgery, EGD completed with esophageal mass ruled out - sp hiatal hernia  repair and peg tube on 11/3, tolerated well without complications - KUB showing gaseous distension of stomach, improved distension but continue to have significant output from peg tube, moderate spontaneous BM yesterday  evening - Hold tube feeds, continue to give meds through tube - Neostigmine 2 mg IV if HR allows, continue reglan  Anemia of critical illness. - Continue to monitor CBCs - transfuse for Hb < 7 or significant bleeding  Acute metabolic encephalopathy 2nd to sepsis, hypoxia, hypercapnia.  Seizure disorder, Schizophrenia. - RASS goal 0 to -1 - Will try to tritrate down on fentanyl, continue precedex and oxycodone - Continue home valproic acid 250 mg BID, quetiapine 400mg  BID,  Lamotrigine 100 mg daily  Scrotal Edema Likely due to volume overload and prolonged mobility  - Elevate scrotum on rolled towels  Hypothyroidism. - Synthroid  Hyperglycemia -SSI   Best practice:  Diet: Hold tube fees, meds through tube DVT prophylaxis: lovenox GI prophylaxis: protonix Mobility: bed rest Code Status: full Disposition: ICU  Labs   CBC: Recent Labs  Lab 10/30/20 0207 11/02/20 0428 11/02/20 1115 11/03/20 0403 11/04/20 0352  WBC 9.7 12.5* 12.0* 11.1* 7.5  NEUTROABS  --   --  9.4*  --   --   HGB 7.6* 8.1* 8.2* 8.2* 8.5*  HCT 25.6* 27.6* 27.4* 27.5* 27.6*  MCV 86.8 87.9 87.3 87.0 86.5  PLT 290 364 400 374 382    Basic Metabolic Panel: Recent Labs  Lab 10/29/20 1217 10/30/20 0207 11/02/20 0428 11/03/20 0403 11/04/20 0352  NA 138 138 134* 135 135  K 4.8 4.2 4.5 4.3 4.1  CL 103 104 98 97* 98  CO2 26 26 29 29 26   GLUCOSE 152* 164* 189* 142* 134*  BUN 25* 20 20 19 16   CREATININE 1.32* 1.04 1.07 1.04 1.15  CALCIUM 7.7* 7.7* 8.4* 8.2* 8.6*  MG  --   --  2.1  --  2.0  PHOS  --   --  4.7*  --   --    GFR: Estimated Creatinine Clearance: 64.6 mL/min (by C-G formula based on SCr of 1.15 mg/dL). Recent Labs  Lab 11/02/20 0428 11/02/20 1115 11/03/20 0403 11/04/20 0352  WBC 12.5* 12.0* 11.1* 7.5    Liver Function Tests: Recent Labs  Lab 11/02/20 0428 11/03/20 0403  AST 85* 67*  ALT 72* 62*  ALKPHOS 226* 214*  BILITOT 0.6 0.4  PROT 6.6 6.3*  ALBUMIN 1.9* 1.9*    No results for input(s): LIPASE, AMYLASE in the last 168 hours. No results for input(s): AMMONIA in the last 168 hours.  ABG    Component Value Date/Time   PHART 7.297 (L) 10/26/2020 1827   PCO2ART 49.8 (H) 10/26/2020 1827   PO2ART 87 10/26/2020 1827   HCO3 24.5 10/26/2020 1827   TCO2 26 10/26/2020 1827   ACIDBASEDEF 2.0 10/26/2020 1827   O2SAT 96.0 10/26/2020 1827     Coagulation Profile: No results for input(s): INR, PROTIME in the last 168 hours.  Cardiac Enzymes: No results for input(s): CKTOTAL, CKMB, CKMBINDEX, TROPONINI in the last 168 hours.  HbA1C: Hgb A1c MFr Bld  Date/Time Value Ref Range Status  10/22/2020 03:46 AM 5.8 (H) 4.8 - 5.6 % Final    Comment:    (NOTE) Pre diabetes:          5.7%-6.4%  Diabetes:              >6.4%  Glycemic control for   <7.0% adults with diabetes   10/05/2007 06:17 AM  Final   5.4 (NOTE)   The ADA recommends the following therapeutic goals for glycemic   control related to Hgb A1C measurement:   Goal of Therapy:   < 7.0% Hgb A1C   Action Suggested:  > 8.0% Hgb A1C   Ref:  Diabetes Care, 22, Suppl. 1, 1999    CBG: Recent Labs  Lab 11/03/20 1517 11/03/20 1923 11/03/20 2327 11/04/20 0317 11/04/20 0730  GLUCAP 125* 109* 120* 125* 107*    Past Medical History  He,  has a past medical history of Barrett esophagus, Dysphagia, Gastroparesis, GERD (gastroesophageal reflux disease), Hypercholesteremia, Hypertension, Iron deficiency anemia, Mental retardation, Schizophrenia (HCC), and Seizure (HCC).   Surgical History    Past Surgical History:  Procedure Laterality Date  . BIOPSY  10/21/2020   Procedure: BIOPSY;  Surgeon: Iva Boop, MD;  Location: [facility] ENDOSCOPY;  Service: Endoscopy;;  . CRANIOTOMY FOR CYST FENESTRATION    . ESOPHAGOGASTRODUODENOSCOPY N/A 01/09/2015   Procedure: ESOPHAGOGASTRODUODENOSCOPY (EGD);  Surgeon: Louis Meckel, MD;  Location: [facility] ENDOSCOPY;  Service: Endoscopy;  Laterality: N/A;  .  ESOPHAGOGASTRODUODENOSCOPY N/A 10/26/2020   Procedure: ESOPHAGOGASTRODUODENOSCOPY (EGD);  Surgeon: Corliss Skains, MD;  Location: [facility] OR;  Service: Thoracic;  Laterality: N/A;  . ESOPHAGOGASTRODUODENOSCOPY (EGD) WITH PROPOFOL N/A 10/21/2020   Procedure: ESOPHAGOGASTRODUODENOSCOPY (EGD) WITH PROPOFOL;  Surgeon: Iva Boop, MD;  Location: [facility] ENDOSCOPY;  Service: Endoscopy;  Laterality: N/A;  . PEG PLACEMENT N/A 10/26/2020   Procedure: PERCUTANEOUS ENDOSCOPIC GASTROSTOMY (PEG) PLACEMENT;  Surgeon: Corliss Skains, MD;  Location: [facility] OR;  Service: Thoracic;  Laterality: N/A;  . XI ROBOTIC ASSISTED HIATAL HERNIA REPAIR N/A 10/26/2020   Procedure: XI ROBOTIC ASSISTED HIATAL HERNIA REPAIR;  Surgeon: Corliss Skains, MD;  Location: [facility] OR;  Service: Thoracic;  Laterality: N/A;     Social History   reports that he has never smoked. He has never used smokeless tobacco. He reports that he does not drink alcohol and does not use drugs.   Family History   His family history is not on file.   Allergies Allergies  Allergen Reactions  . Vancomycin Other (See Comments)    Unknown-possible hives      Home Medications  Prior to Admission medications   Medication Sig Start Date End Date Taking? Authorizing Provider  benazepril (LOTENSIN) 20 MG tablet Take 20 mg by mouth daily.   Yes [provider]  Cholecalciferol (VITAMIN D) 2000 units tablet Take 2,000 Units by mouth daily.   Yes [provider]  divalproex (DEPAKOTE ER) 500 MG 24 hr tablet Take 500 mg by mouth daily.   Yes [provider]  esomeprazole (NEXIUM) 40 MG capsule Take 40 mg by mouth in the morning and at bedtime.   Yes [provider]  lamoTRIgine (LAMICTAL) 100 MG tablet Take 100 mg by mouth daily.   Yes [provider]  levothyroxine (SYNTHROID, LEVOTHROID) 75 MCG tablet Take 75 mcg by mouth daily.   Yes [provider]  metoCLOPramide (REGLAN) 5 MG tablet Take 1  tablet (5 mg total) by mouth 3 (three) times daily before meals. Patient taking differently: Take 5 mg by mouth in the morning and at bedtime.  07/27/15  Yes Regalado, Belkys A, MD  propranolol (INDERAL) 10 MG tablet Take 10 mg by mouth daily. 03/31/18  Yes [provider]  QUEtiapine (SEROQUEL) 400 MG tablet Take 400 mg by mouth 2 (two) times daily.    Yes [provider]  rosuvastatin (CRESTOR) 20 MG  tablet Take 20 mg by mouth daily.   Yes [provider]    Quincy SimmondsJessica Zhoey Blackstock PGY-1 Internal Medicine 11/04/20 7:23 AM Pager [phone]

## 2020-11-04 NOTE — Progress Notes (Signed)
Assisted tele visit to patient with family member.  Edessa Jakubowicz Harold, RN  

## 2020-11-04 NOTE — Progress Notes (Signed)
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 External drainage devices attempted multiple times without success due to swelling in penis and scrotum. Skin intact at present but getting progressively more reddened and irritated due to constant moisture associated issues w/incontinence and abdominal drainage. Abdomen continues to be taut despite gtube to gravity drain. Will continue to monitor and report.

## 2020-11-05 ENCOUNTER — Encounter (HOSPITAL_COMMUNITY): Payer: Self-pay | Admitting: Thoracic Surgery (Cardiothoracic Vascular Surgery)

## 2020-11-05 DIAGNOSIS — J9601 Acute respiratory failure with hypoxia: Secondary | ICD-10-CM | POA: Diagnosis not present

## 2020-11-05 DIAGNOSIS — K560 Paralytic ileus: Secondary | ICD-10-CM

## 2020-11-05 DIAGNOSIS — K56609 Unspecified intestinal obstruction, unspecified as to partial versus complete obstruction: Secondary | ICD-10-CM

## 2020-11-05 LAB — GLUCOSE, CAPILLARY
Glucose-Capillary: 104 mg/dL — ABNORMAL HIGH (ref 70–99)
Glucose-Capillary: 107 mg/dL — ABNORMAL HIGH (ref 70–99)
Glucose-Capillary: 111 mg/dL — ABNORMAL HIGH (ref 70–99)
Glucose-Capillary: 122 mg/dL — ABNORMAL HIGH (ref 70–99)
Glucose-Capillary: 133 mg/dL — ABNORMAL HIGH (ref 70–99)
Glucose-Capillary: 136 mg/dL — ABNORMAL HIGH (ref 70–99)

## 2020-11-05 LAB — BASIC METABOLIC PANEL
Anion gap: 8 (ref 5–15)
BUN: 9 mg/dL (ref 8–23)
CO2: 27 mmol/L (ref 22–32)
Calcium: 8.7 mg/dL — ABNORMAL LOW (ref 8.9–10.3)
Chloride: 101 mmol/L (ref 98–111)
Creatinine, Ser: 1.03 mg/dL (ref 0.61–1.24)
GFR, Estimated: >60 mL/min (ref >60)
Glucose, Bld: 112 mg/dL — ABNORMAL HIGH (ref 70–99)
Potassium: 3.5 mmol/L (ref 3.5–5.1)
Sodium: 136 mmol/L (ref 135–145)

## 2020-11-05 LAB — CBC
HCT: 28.5 % — ABNORMAL LOW (ref 39.0–52.0)
Hemoglobin: 8.4 g/dL — ABNORMAL LOW (ref 13.0–17.0)
MCH: 25.9 pg — ABNORMAL LOW (ref 26.0–34.0)
MCHC: 29.5 g/dL — ABNORMAL LOW (ref 30.0–36.0)
MCV: 88 fL (ref 80.0–100.0)
Platelets: 431 10*3/uL — ABNORMAL HIGH (ref 150–400)
RBC: 3.24 MIL/uL — ABNORMAL LOW (ref 4.22–5.81)
RDW: 18.9 % — ABNORMAL HIGH (ref 11.5–15.5)
WBC: 7.4 10*3/uL (ref 4.0–10.5)
nRBC: 0.3 % — ABNORMAL HIGH (ref 0.0–0.2)

## 2020-11-05 LAB — MAGNESIUM: Magnesium: 2.1 mg/dL (ref 1.7–2.4)

## 2020-11-05 MED ORDER — VITAL AF 1.2 CAL PO LIQD
1000.0000 mL | ORAL | Status: DC
  Administered 2020-11-05 – 2020-11-07 (×2): 1000 mL

## 2020-11-05 MED ORDER — METOCLOPRAMIDE HCL 5 MG/ML IJ SOLN
10.0000 mg | Freq: Four times a day (QID) | INTRAMUSCULAR | Status: DC
  Administered 2020-11-05 – 2020-11-07 (×9): 10 mg via INTRAVENOUS
  Filled 2020-11-05 (×9): qty 2

## 2020-11-05 MED ORDER — NEOSTIGMINE METHYLSULFATE 10 MG/10ML IV SOLN
0.2500 mg | Freq: Four times a day (QID) | INTRAVENOUS | Status: DC
  Administered 2020-11-05 – 2020-11-06 (×5): 0.25 mg via SUBCUTANEOUS
  Filled 2020-11-05 (×7): qty 0.25

## 2020-11-05 MED ORDER — POTASSIUM CHLORIDE 20 MEQ/15ML (10%) PO SOLN
40.0000 meq | Freq: Once | ORAL | Status: AC
  Administered 2020-11-05: 40 meq
  Filled 2020-11-05: qty 30

## 2020-11-05 NOTE — Progress Notes (Signed)
   NAME:  [name], MRN:  [id number], DOB:  1956-01-07, LOS: 15 ADMISSION DATE:  10/20/2020, CONSULTATION DATE:  10/21/2020 REFERRING MD: Dr. Loney Loh, CHIEF COMPLAINT:  Dyspnea  Brief History   64 year old male presented to ED after syncopal episode and was found to have large hiatal hernia compressing trachea.   Past Medical History  GERD, Gastroparesis, Barrett's esophagus, HLD, HTN, Iron deficiency anemia, Seizures, Schizophrenia, Developmental delay  Significant Hospital Events   10/29 admit, EGD done 10/30 transferred to ICU for respiratory distress, intubated. 11/3 Hiatal hernia repair, g-tube  Consults:  CVTS GI Procedures:  ETT 10/30 >> Lt [place] CVL 10/30 >>  Robotic assisted hiatal hernia repair 11/3 Peg placement 11/3 11/13 extubated   Significant Diagnostic Tests:  See radiology tab   Micro Data:  COVID 10/28 >> negative MRSA PCR 10/28 >> negative Blood 10/28 >> negative Sputum 10/30 >> negative Tracheal aspirate 11/6 >> few klebsiella pneumoniae, citrobacter koseri  Antimicrobials:  Unasyn 10/28 >> 11/1 Bactrim 11/10 >>  Interim history/subjective:  No events, 300cc from PEG.  Patient resting comfortably states his abdomen is distended and uncomfortable   Objective   Blood pressure (!) 158/75, pulse 99, temperature (!) 100.6 F (38.1 C), temperature source Oral, resp. rate (!) 24, height 5\' 4"  (1.626 m), weight 79.2 kg, SpO2 99 %.    Vent Mode: PRVC FiO2 (%):  [30 %] 30 % Set Rate:  [20 bmp] 20 bmp Vt Set:  [410 mL] 410 mL PEEP:  [5 cmH20] 5 cmH20 Plateau Pressure:  [21 cmH20] 21 cmH20   Intake/Output Summary (Last 24 hours) at 11/05/2020 0708 Last data filed at 11/05/2020 0600 Gross per 24 hour  Intake 384.83 ml  Output 1300 ml  Net -915.17 ml   Filed Weights   11/03/20 0407 11/04/20 0500 11/05/20 0400  Weight: 85.8 kg 82.6 kg 79.2 kg   Examination: Constitutional: elderly man in no acute distress Eyes: eyes are anicteric,  reactive to light Ears, nose, mouth, and throat: mucous membranes dry, trachea midline Cardiovascular: Regular rate and rhythm no mumurs Respiratory: Chest clear to ascultation, no crackles or wheezing, saturating in high 90s on 4 L [place] Gastrointestinal: distended, tympanic to percussion, +BS, peg draining green bilious material Skin: No rashes, normal turgor Neurologic: Alert and oriented to person and place  CBC and BMP benign/stable  Resolved Hospital Problem list   Circulatory shock  Assessment & Plan:   Acute hypoxic/hypercapnic respiratory failure 2nd to aspiration pneumonia. Leukocytosis Ventilator associated pneumonia 2/2 klebsiella pneumonia and citrobacter koseri - Extubated, encourage IS/flutter - On day 4/5 of bactrim  Large hiatal hernia. GERD, Barrett's esophagus, gastroparesis. Ongoing gastroparesis Continue to have abdominal distention with 300 ml output from peg  - Rechallenge with TF today - Continue Reglan, start neostigmine   Acute metabolic encephalopathy 2nd to sepsis, hypoxia, hypercapnia.  Seizure disorder, Schizophrenia. - Continue PTA depakote  Scrotal Edema Likely due to volume overload and prolonged mobility  - Local wound care  Hypothyroidism. - Synthroid  Hyperglycemia -SSI   Muscular deconditioning- PT/OT  Best practice:  Diet: Rechallenge with TF today DVT prophylaxis: lovenox GI prophylaxis: protonix Mobility: bed rest Code Status: full Disposition: Stable for transfer to triad  11/07/20 PGY-1  11/05/20 7:08AM

## 2020-11-05 NOTE — H&P (Signed)
Chief Complaint: Gastrparesis, high residuals with tube feeds  Referring Physician(s): Lorin Glass, MD  Supervising Physician: Irish Lack  Patient Status: [facility] - In-pt  History of Present Illness: [name] is a 64 y.o. male with medical history significant for stroke with left-sided deficits and dysphagia, Barrett's esophagus, GERD, gastroparesis, hypertension, hyperlipidemia, iron deficiency anemia, mental retardation, schizophrenia, seizure disorder, and hypothyroidism.  He presented to the ED on 10/21/2020 after a fall at his group home.  CT scan showed= large, stable gastric hernia with associated esophageal dilatation and subsequent mass-effect on the trachea.  On 10/26/2020 He underwent= - Esophagoscopy - Robotic assisted laparoscopy - Paraesophageal hernia repair with a pledgeted felt strips - Percutaneous gastrostomy tube with a 24 French tube By Dr. Cliffton Asters.  Per notes, he continues to have high residuals with tube feeds. Gtube is currently to gravity drain.  We are asked to place a PEJ with venting G port to help TF tolerance.  Patient does not follow command to open mouth due to cognitive deficit  Past Medical History:  Diagnosis Date  . Barrett esophagus   . Dysphagia   . Gastroparesis   . GERD (gastroesophageal reflux disease)   . Hypercholesteremia   . Hypertension   . Iron deficiency anemia   . Mental retardation   . Schizophrenia (HCC)   . Seizure [facility])     Past Surgical History:  Procedure Laterality Date  . BIOPSY  10/21/2020   Procedure: BIOPSY;  Surgeon: Iva Boop, MD;  Location: [facility] ENDOSCOPY;  Service: Endoscopy;;  . CRANIOTOMY FOR CYST FENESTRATION    . ESOPHAGOGASTRODUODENOSCOPY N/A 01/09/2015   Procedure: ESOPHAGOGASTRODUODENOSCOPY (EGD);  Surgeon: Louis Meckel, MD;  Location: [facility] ENDOSCOPY;  Service: Endoscopy;  Laterality: N/A;  . ESOPHAGOGASTRODUODENOSCOPY N/A 10/26/2020   Procedure:  ESOPHAGOGASTRODUODENOSCOPY (EGD);  Surgeon: Corliss Skains, MD;  Location: [facility] OR;  Service: Thoracic;  Laterality: N/A;  . ESOPHAGOGASTRODUODENOSCOPY (EGD) WITH PROPOFOL N/A 10/21/2020   Procedure: ESOPHAGOGASTRODUODENOSCOPY (EGD) WITH PROPOFOL;  Surgeon: Iva Boop, MD;  Location: [facility] ENDOSCOPY;  Service: Endoscopy;  Laterality: N/A;  . PEG PLACEMENT N/A 10/26/2020   Procedure: PERCUTANEOUS ENDOSCOPIC GASTROSTOMY (PEG) PLACEMENT;  Surgeon: Corliss Skains, MD;  Location: [facility] OR;  Service: Thoracic;  Laterality: N/A;  . XI ROBOTIC ASSISTED HIATAL HERNIA REPAIR N/A 10/26/2020   Procedure: XI ROBOTIC ASSISTED HIATAL HERNIA REPAIR;  Surgeon: Corliss Skains, MD;  Location: [facility] OR;  Service: Thoracic;  Laterality: N/A;    Allergies: Vancomycin  Medications: Prior to Admission medications   Medication Sig Start Date End Date Taking? Authorizing Provider  benazepril (LOTENSIN) 20 MG tablet Take 20 mg by mouth daily.   Yes [provider]  Cholecalciferol (VITAMIN D) 2000 units tablet Take 2,000 Units by mouth daily.   Yes [provider]  divalproex (DEPAKOTE ER) 500 MG 24 hr tablet Take 500 mg by mouth daily.   Yes [provider]  esomeprazole (NEXIUM) 40 MG capsule Take 40 mg by mouth in the morning and at bedtime.   Yes [provider]  lamoTRIgine (LAMICTAL) 100 MG tablet Take 100 mg by mouth daily.   Yes [provider]  levothyroxine (SYNTHROID, LEVOTHROID) 75 MCG tablet Take 75 mcg by mouth daily.   Yes [provider]  metoCLOPramide (REGLAN) 5 MG tablet Take 1 tablet (5 mg total) by mouth 3 (three) times daily before meals. Patient taking differently: Take 5 mg by mouth in the morning and at bedtime.  07/27/15  Yes Regalado, Belkys A, MD  propranolol (INDERAL) 10 MG tablet Take 10 mg by mouth daily. 03/31/18  Yes [provider]  QUEtiapine (SEROQUEL) 400 MG tablet Take 400 mg by mouth 2 (two) times daily.    Yes  [provider]  rosuvastatin (CRESTOR) 20 MG tablet Take 20 mg by mouth daily.   Yes [provider]     History reviewed. No pertinent family history.  Social History   Socioeconomic History  . Marital status: Single    Spouse name: Not on file  . Number of children: Not on file  . Years of education: Not on file  . Highest education level: Not on file  Occupational History  . Not on file  Tobacco Use  . Smoking status: Never Smoker  . Smokeless tobacco: Never Used  Substance and Sexual Activity  . Alcohol use: No  . Drug use: No  . Sexual activity: Never  Other Topics Concern  . Not on file  Social History Narrative  . Not on file   Social Determinants of Health   Financial Resource Strain:   . Difficulty of Paying Living Expenses: Not on file  Food Insecurity:   . Worried About Programme researcher, broadcasting/film/video in the Last Year: Not on file  . Ran Out of Food in the Last Year: Not on file  Transportation Needs:   . Lack of Transportation (Medical): Not on file  . Lack of Transportation (Non-Medical): Not on file  Physical Activity:   . Days of Exercise per Week: Not on file  . Minutes of Exercise per Session: Not on file  Stress:   . Feeling of Stress : Not on file  Social Connections:   . Frequency of Communication with Friends and Family: Not on file  . Frequency of Social Gatherings with Friends and Family: Not on file  . Attends Religious Services: Not on file  . Active Member of Clubs or Organizations: Not on file  . Attends Banker Meetings: Not on file  . Marital Status: Not on file     Review of Systems  Unable to perform ROS: Psychiatric disorder    Vital Signs: BP (!) 158/75 (BP Location: Right Leg)   Pulse 99   Temp 98.4 F (36.9 C) (Axillary)   Resp (!) 25   Ht  (1.626 m)   Wt 79.2 kg   SpO2 98%   BMI 29.97 kg/m   Physical Exam Constitutional:      Comments: Awake, but patient does not follow command to open  mouth due to cognitive deficit.  HENT:     Head: Normocephalic and atraumatic.  Cardiovascular:     Rate and Rhythm: Tachycardia present.  Pulmonary:     Effort: Pulmonary effort is normal. No respiratory distress.  Abdominal:     Comments: Gastrostomy tube in place, to gravity bag.  Skin:    General: Skin is warm and dry.  Neurological:     Mental Status: He is alert. Mental status is at baseline.     Imaging: DG Abd 1 View  Result Date: 11/02/2020 CLINICAL DATA:  Abdominal distension EXAM: ABDOMEN - 1 VIEW COMPARISON:  10/24/2020 FINDINGS: Gastrostomy catheter is now seen in place. Previously noted feeding catheter is been removed. Scattered large and small bowel gas is noted. The stomach is significantly distended. No free air is noted. IMPRESSION: Gaseous distension of the stomach. Gastrostomy catheter in place. Electronically Signed   By: Loraine Leriche  Lukens M.D.   On: 11/02/2020 10:04   DG Abd 1 View  Result Date: 10/24/2020 CLINICAL DATA:  Feeding tube placement EXAM: ABDOMEN - 1 VIEW COMPARISON:  Portable exam 0940 hours compared to 09/05/2020 FINDINGS: Tip of feeding tube projects over distal gastric antrum/pylorus. LEFT subclavian central venous catheter with tip projecting over SVC and azygos arch. RIGHT perihilar infiltrate. Atelectasis versus consolidation LEFT lower lobe. Visualized bowel gas pattern normal. IMPRESSION: RIGHT perihilar infiltrate with atelectasis versus consolidation LEFT lower lobe. Tip of feeding tube projects over distal gastric antrum/pylorus. Electronically Signed   By: Ulyses Southward M.D.   On: 10/24/2020 09:46   CT HEAD WO CONTRAST  Result Date: 10/21/2020 CLINICAL DATA:  Syncope EXAM: CT HEAD WITHOUT CONTRAST TECHNIQUE: Contiguous axial images were obtained from the base of the skull through the vertex without intravenous contrast. COMPARISON:  March 25, 2018 FINDINGS: Brain: No evidence of acute territorial infarction, hemorrhage, hydrocephalus,extra-axial  collection or mass lesion/mass effect. There is ex vacuo dilatation of the right lateral ventricle. Low-attenuation changes in the deep white matter consistent with small vessel ischemia. Large area of encephalomalacia involving the right frontal lobe. Vascular: No hyperdense vessel or unexpected calcification. Skull: Prior frontal craniotomy defect is noted. No fracture or focal lesion identified. Sinuses/Orbits: The visualized paranasal sinuses and mastoid air cells are clear. The orbits and globes intact. Other: None IMPRESSION: No acute intracranial abnormality. Large area encephalomalacia involving the right frontal lobe Electronically Signed   By: Jonna Clark M.D.   On: 10/21/2020 03:49   CT Angio Chest PE W/Cm &/Or Wo Cm  Result Date: 10/21/2020 CLINICAL DATA:  Status post fall with hypoxia and hiccups. EXAM: CT ANGIOGRAPHY CHEST CT ABDOMEN AND PELVIS WITH CONTRAST TECHNIQUE: Multidetector CT imaging of the chest was performed using the standard protocol during bolus administration of intravenous contrast. Multiplanar CT image reconstructions and MIPs were obtained to evaluate the vascular anatomy. Multidetector CT imaging of the abdomen and pelvis was performed using the standard protocol during bolus administration of intravenous contrast. CONTRAST:  75mL OMNIPAQUE IOHEXOL 350 MG/ML SOLN COMPARISON:  None. FINDINGS: CT CHEST FINDINGS Cardiovascular: No significant vascular findings. Normal heart size. No pericardial effusion. Mediastinum/Nodes: No enlarged mediastinal, hilar, or axillary lymph nodes. The esophagus is markedly dilated (approximately 5.9 cm x 3.9 cm) and extends inferiorly into a large gastric hernia. Subsequent mass effect is seen on the trachea with anterior tracheal displacement noted. Lungs/Pleura: Very mild atelectasis is seen within the posteromedial aspect of the right upper lobe. There is no evidence of acute infiltrate, pleural effusion or pneumothorax. Musculoskeletal: No  chest wall mass or suspicious bone lesions identified. CT ABDOMEN PELVIS FINDINGS Hepatobiliary: No focal liver abnormality is seen. No gallstones, gallbladder wall thickening, or biliary dilatation. Pancreas: Unremarkable. No pancreatic ductal dilatation or surrounding inflammatory changes. Spleen: Normal in size without focal abnormality. Adrenals/Urinary Tract: Adrenal glands are unremarkable. Kidneys are normal, without renal calculi, focal lesion, or hydronephrosis. Bladder is unremarkable. Stomach/Bowel: A large, stable gastric hernia is seen. Appendix appears normal. No evidence of bowel wall thickening, distention, or inflammatory changes. Small, noninflamed diverticula are seen within the sigmoid colon. Vascular/Lymphatic: Aortic atherosclerosis. No enlarged abdominal or pelvic lymph nodes. Reproductive: Prostate is unremarkable. Other: No abdominal wall hernia or abnormality. No abdominopelvic ascites. Musculoskeletal: No acute or significant osseous findings. IMPRESSION: 1. Large, stable gastric hernia with associated esophageal dilatation and subsequent mass effect on the trachea. 2. Very mild right upper lobe atelectasis. 3. Sigmoid diverticulosis. 4. Aortic atherosclerosis. Aortic Atherosclerosis (  ICD10-I70.0). Electronically Signed   By: Aram Candela M.D.   On: 10/21/2020 00:20   CT ABDOMEN PELVIS W CONTRAST  Result Date: 10/21/2020 CLINICAL DATA:  Unwitnessed fall with hypoxia, hiccups and coughing. EXAM: CT CHEST, ABDOMEN, AND PELVIS WITH CONTRAST TECHNIQUE: Multidetector CT imaging of the chest, abdomen and pelvis was performed following the standard protocol during bolus administration of intravenous contrast. CONTRAST:  75mL OMNIPAQUE IOHEXOL 350 MG/ML SOLN COMPARISON:  Abdomen pelvis CT, dated September 05, 2020 FINDINGS: CT CHEST FINDINGS Cardiovascular: No significant vascular findings. Normal heart size. No pericardial effusion. Mediastinum/Nodes: No enlarged mediastinal, hilar, or  axillary lymph nodes. The esophagus is markedly dilated (approximately 5.9 cm x 3.9 cm) and extends inferiorly into a large gastric hernia. Subsequent mass effect is seen on the trachea with anterior tracheal displacement noted. Lungs/Pleura: Very mild atelectasis is seen within the posteromedial aspect of the right upper lobe. There is no evidence of acute infiltrate, pleural effusion or pneumothorax. Musculoskeletal: No chest wall mass or suspicious bone lesions identified. CT ABDOMEN PELVIS FINDINGS Hepatobiliary: No focal liver abnormality is seen. No gallstones, gallbladder wall thickening, or biliary dilatation. Pancreas: Unremarkable. No pancreatic ductal dilatation or surrounding inflammatory changes. Spleen: Normal in size without focal abnormality. Adrenals/Urinary Tract: Adrenal glands are unremarkable. Kidneys are normal, without renal calculi, focal lesion, or hydronephrosis. Bladder is unremarkable. Stomach/Bowel: A large, stable gastric hernia is seen. Appendix appears normal. No evidence of bowel wall thickening, distention, or inflammatory changes. Small, noninflamed diverticula are seen within the sigmoid colon. Vascular/Lymphatic: Aortic atherosclerosis. No enlarged abdominal or pelvic lymph nodes. Reproductive: Prostate is unremarkable. Other: No abdominal wall hernia or abnormality. No abdominopelvic ascites. Musculoskeletal: No acute or significant osseous findings. IMPRESSION: 1. Large, stable gastric hernia with associated esophageal dilatation and subsequent mass effect on the trachea. 2. Very mild right upper lobe atelectasis. 3. Sigmoid diverticulosis. 4. Aortic atherosclerosis. Aortic Atherosclerosis (ICD10-I70.0). Electronically Signed   By: Aram Candela M.D.   On: 10/21/2020 00:13   DG CHEST PORT 1 VIEW  Result Date: 11/04/2020 CLINICAL DATA:  Respiratory failure. EXAM: PORTABLE CHEST 1 VIEW COMPARISON:  Chest x-ray 11/02/2020 FINDINGS: The endotracheal tube is 5.4 cm above  the carina. The left subclavian central venous catheter is stable. The cardiac silhouette, mediastinal and hilar contours are stable. Persistent right lower lobe infiltrate and probable small effusion. Slight improved left basilar aeration when compared to prior study. No definite pneumothorax. IMPRESSION: 1. Stable support apparatus. 2. Persistent right lower lobe infiltrate and probable small effusion. Electronically Signed   By: Rudie Meyer M.D.   On: 11/04/2020 07:16   DG CHEST PORT 1 VIEW  Result Date: 11/02/2020 CLINICAL DATA:  Acute hypoxemic respiratory failure. EXAM: PORTABLE CHEST 1 VIEW COMPARISON:  October 29, 2020. FINDINGS: Stable cardiomediastinal silhouette. Endotracheal tube is in grossly good position. Left subclavian catheter is unchanged. No pneumothorax is noted. Bilateral lung opacities are noted concerning for edema or atelectasis with associated pleural effusions. Bony thorax is unremarkable. IMPRESSION: Stable support apparatus. Bilateral lung opacities are noted concerning for edema or atelectasis with associated pleural effusions. No pneumothorax is noted. Electronically Signed   By: Lupita Raider M.D.   On: 11/02/2020 08:00   DG CHEST PORT 1 VIEW  Result Date: 10/29/2020 CLINICAL DATA:  Follow-up pneumothorax EXAM: PORTABLE CHEST 1 VIEW COMPARISON:  Chest radiograph from one day prior. FINDINGS: Endotracheal tube tip is 1.9 cm above the carina. Left subclavian central venous catheter terminates over the upper third of the SVC. Stable  cardiomediastinal silhouette with top-normal heart size. No residual pneumothorax. Probable small bilateral pleural effusions, similar. Low lung volumes, slightly improved. Hazy bibasilar lung opacities, slightly improved, favor atelectasis. IMPRESSION: 1. No residual pneumothorax. 2. Low lung volumes and hazy bibasilar lung opacities, improved, favor atelectasis. 3. Probable small bilateral pleural effusions, similar. Electronically Signed   By:  Delbert Phenix M.D.   On: 10/29/2020 08:08   DG CHEST PORT 1 VIEW  Result Date: 10/28/2020 CLINICAL DATA:  Pneumothorax follow-up EXAM: PORTABLE CHEST 1 VIEW COMPARISON:  Yesterday FINDINGS: Endotracheal tube with tip just above the clavicular heads, unchanged. Left subclavian line with tip at the left brachiocephalic/SVC junction.Right apical pneumothorax measuring 18 mm craniocaudal thickness. Low volume chest with hazy opacity on both sides. Stable heart size distorted by rightward rotation. IMPRESSION: 1. Unchanged small right apical pneumothorax. 2. Low volume chest with progressive volume loss. Electronically Signed   By: Marnee Spring M.D.   On: 10/28/2020 05:10   DG CHEST PORT 1 VIEW  Result Date: 10/27/2020 CLINICAL DATA:  Pneumothorax EXAM: PORTABLE CHEST 1 VIEW COMPARISON:  10/24/2020 FINDINGS: Right apical pneumothorax approximately 2 cm. This has developed since the prior study. Endotracheal tube in good position. Left subclavian central venous catheter tip in the mid SVC unchanged. Bibasilar airspace disease similar to the prior study. Possible small effusions. IMPRESSION: Interval development of 2 cm right apical pneumothorax. Bibasilar airspace disease unchanged Electronically Signed   By: Marlan Palau M.D.   On: 10/27/2020 09:06   DG Chest Port 1 View  Result Date: 10/24/2020 CLINICAL DATA:  Respiratory failure EXAM: PORTABLE CHEST 1 VIEW COMPARISON:  10/23/2020 FINDINGS: Tracheostomy tube tip is above the carina. Left subclavian central venous catheter tip is in the SVC. Large hiatal hernia. Airspace opacification involving the right midlung and right base is unchanged. IMPRESSION: 1. No change in aeration to the right midlung and right base. 2. Stable support apparatus.  No Electronically Signed   By: Signa Kell M.D.   On: 10/24/2020 07:29   DG Chest Port 1 View  Result Date: 10/23/2020 CLINICAL DATA:  Respiratory failure. EXAM: PORTABLE CHEST 1 VIEW COMPARISON:  10/22/2020  and earlier studies FINDINGS: Endotracheal tube and left subclavian central venous line are stable. Hazy airspace opacity is noted in a right perihilar and lower lung distribution with additional airspace opacity at the left medial lung base. The appearance is unchanged compared to the previous day's exam. No new lung abnormalities. No pneumothorax. IMPRESSION: 1. Right greater than left perihilar and lower lung airspace opacities, which may be due to edema, atelectasis or a combination. This is stable when compared to the previous day's exam. 2. Stable well-positioned support apparatus. Electronically Signed   By: Amie Portland M.D.   On: 10/23/2020 06:44   DG Chest Port 1 View  Result Date: 10/22/2020 CLINICAL DATA:  Central line placement EXAM: PORTABLE CHEST 1 VIEW COMPARISON:  10/22/2020 FINDINGS: There is a new subclavian left-sided central venous catheter with tip projecting over the SVC. The endotracheal tube terminates above the carina. There are worsening perihilar airspace opacities, right greater than left. There are probable small bilateral pleural effusions. IMPRESSION: 1. New left-sided central venous catheter as above. No pneumothorax. 2. Worsening bilateral perihilar airspace opacities. Electronically Signed   By: Katherine Mantle M.D.   On: 10/22/2020 16:49   Portable Chest x-ray  Result Date: 10/22/2020 CLINICAL DATA:  Endotracheal tube placement EXAM: PORTABLE CHEST 1 VIEW COMPARISON:  10/20/2020 FINDINGS: Endotracheal tube tip is just above the  carina. Retraction by 1-2 cm is recommended. There is bibasilar atelectasis. No focal airspace consolidation. No pleural effusion or pneumothorax. Normal cardiomediastinal contours. IMPRESSION: 1. Endotracheal tube tip just above the carina. Retraction by 1-2 cm is recommended. 2. Bibasilar atelectasis. Electronically Signed   By: Deatra Bhargava M.D.   On: 10/22/2020 05:28   DG Chest Port 1 View  Result Date: 10/20/2020 CLINICAL DATA:   Hypoxia and cough. EXAM: PORTABLE CHEST 1 VIEW COMPARISON:  September 05, 2020 FINDINGS: Low lung volumes are seen which is likely, in part, secondary to the degree of patient inspiration. There is no evidence of acute infiltrate, pleural effusion or pneumothorax. The heart size and mediastinal contours are within normal limits. A markedly distended and tortuous air-filled esophagus is seen. Marked severity gastric distension is also present. The visualized skeletal structures are unremarkable. IMPRESSION: 1. Low lung volumes without evidence of acute infiltrate. 2. Marked severity gastric and esophageal distention. Sequelae associated with gastric outlet or small bowel obstruction cannot be excluded. Correlation with abdomen pelvis CT is recommended. Electronically Signed   By: Aram Candela M.D.   On: 10/20/2020 21:44   EEG adult  Result Date: 10/21/2020 Charlsie Quest, MD     10/21/2020 12:29 PM Patient Name: BRAM HOTTEL MRN: [id number] Epilepsy Attending: Charlsie Quest Referring Physician/Provider: Dr Eligha Bridegroom Date: 10/21/2020 Duration: 25.58 mins Patient history: 64 year old male with syncope.  EEG to evaluate for seizures. Level of alertness: Awake, drowsy AEDs during EEG study: VPA Technical aspects: This EEG study was done with scalp electrodes positioned according to the 10-20 International system of electrode placement. Electrical activity was acquired at a sampling rate of  and reviewed with a high frequency filter of  and a low frequency filter of . EEG data were recorded continuously and digitally stored. Description: The posterior dominant rhythm consists of 6.5 Hz activity of moderate voltage (25-35 uV) seen predominantly in posterior head regions, symmetric and reactive to eye opening and eye closing.  Drowsiness is characterized by attenuation of the posterior dominant rhythm.  EEG showed continuous generalized 3 to 6 Hz theta-delta slowing. Physiologic  photic driving was not seen during photic stimulation.  Hyperventilation was not performed.   ABNORMALITY -Continuous slow, generalized - Background slow IMPRESSION: This study is suggestive of mild to moderate diffuse encephalopathy, nonspecific etiology. No seizures or epileptiform discharges were seen throughout the recording. Charlsie Quest   ECHOCARDIOGRAM COMPLETE  Result Date: 10/21/2020    ECHOCARDIOGRAM REPORT   Patient Name:   WILMAN TUCKER Date of Exam: 10/21/2020 Medical Rec #:  [id number]            Height:       63.0 in Accession #:    [id number]           Weight:       169.8 lb Date of Birth:  [birth year]           BSA:          1.803 m Patient Age:    63 years             BP:           103/85 mmHg Patient Gender: M                    HR:           106 bpm. Exam Location:  Inpatient Procedure: 2D Echo, Cardiac Doppler, Color Doppler and Intracardiac  Opacification Agent Indications:    Syncope 780.2  History:        Patient has prior history of Echocardiogram examinations, most                 recent 10/07/2007. Risk Factors:Hypertension, Dyslipidemia and                 Non-Smoker. GERD.  Sonographer:    Renella Cunas RDCS Referring Phys: [id number] VASUNDHRA RATHORE IMPRESSIONS  1. There is an intracavitary gradient up to 24 mmHG due to the hyperdynamic LV function. Left ventricular ejection fraction, by estimation, is 70 to 75%. The left ventricle has hyperdynamic function. The left ventricle has no regional wall motion abnormalities. Left ventricular diastolic parameters are consistent with Grade I diastolic dysfunction (impaired relaxation).  2. Right ventricular systolic function is normal. The right ventricular size is normal. Tricuspid regurgitation signal is inadequate for assessing PA pressure.  3. The mitral valve is grossly normal. No evidence of mitral valve regurgitation. No evidence of mitral stenosis.  4. The aortic valve is tricuspid. Aortic valve regurgitation is  not visualized. No aortic stenosis is present.  5. The inferior vena cava is normal in size with greater than 50% respiratory variability, suggesting right atrial pressure of 3 mmHg. FINDINGS  Left Ventricle: There is an intracavitary gradient up to 24 mmHG due to the hyperdynamic LV function. Left ventricular ejection fraction, by estimation, is 70 to 75%. The left ventricle has hyperdynamic function. The left ventricle has no regional wall motion abnormalities. Definity contrast agent was given IV to delineate the left ventricular endocardial borders. The left ventricular internal cavity size was normal in size. There is no left ventricular hypertrophy. Left ventricular diastolic parameters are consistent with Grade I diastolic dysfunction (impaired relaxation). Right Ventricle: The right ventricular size is normal. No increase in right ventricular wall thickness. Right ventricular systolic function is normal. Tricuspid regurgitation signal is inadequate for assessing PA pressure. Left Atrium: Left atrial size was normal in size. Right Atrium: Right atrial size was normal in size. Pericardium: There is no evidence of pericardial effusion. Mitral Valve: The mitral valve is grossly normal. No evidence of mitral valve regurgitation. No evidence of mitral valve stenosis. Tricuspid Valve: The tricuspid valve is grossly normal. Tricuspid valve regurgitation is not demonstrated. No evidence of tricuspid stenosis. Aortic Valve: The aortic valve is tricuspid. Aortic valve regurgitation is not visualized. No aortic stenosis is present. Pulmonic Valve: The pulmonic valve was grossly normal. Pulmonic valve regurgitation is not visualized. No evidence of pulmonic stenosis. Aorta: The aortic root is normal in size and structure. Venous: The inferior vena cava is normal in size with greater than 50% respiratory variability, suggesting right atrial pressure of 3 mmHg. IAS/Shunts: The atrial septum is grossly normal.  LEFT  VENTRICLE PLAX 2D LVIDd:         3.80 cm     Diastology LVIDs:         2.70 cm     LV e' medial:    6.85 cm/s LV PW:         0.80 cm     LV E/e' medial:  11.6 LV IVS:        0.80 cm     LV e' lateral:   7.51 cm/s LVOT diam:     1.80 cm     LV E/e' lateral: 10.6 LV SV:         58 LV SV Index:   32 LVOT Area:  2.54 cm  LV Volumes (MOD) LV vol d, MOD A4C: 41.8 ml LV vol s, MOD A4C: 12.0 ml LV SV MOD A4C:     41.8 ml RIGHT VENTRICLE RV S prime:     10.00 cm/s TAPSE (M-mode): 2.0 cm LEFT ATRIUM             Index       RIGHT ATRIUM          Index LA diam:        3.10 cm 1.72 cm/m  RA Area:     8.05 cm LA Vol (A2C):   24.7 ml 13.70 ml/m RA Volume:   13.00 ml 7.21 ml/m LA Vol (A4C):   21.7 ml 12.03 ml/m LA Biplane Vol: 24.5 ml 13.58 ml/m  AORTIC VALVE LVOT Vmax:   148.00 cm/s LVOT Vmean:  103.000 cm/s LVOT VTI:    0.227 m  AORTA Ao Root diam: 3.10 cm MITRAL VALVE MV Area (PHT): 4.49 cm    SHUNTS MV Decel Time: 169 msec    Systemic VTI:  0.23 m MV E velocity: 79.30 cm/s  Systemic Diam: 1.80 cm MV A velocity: 89.70 cm/s MV E/A ratio:  0.88 Lennie Odor MD Electronically signed by Lennie Odor MD Signature Date/Time: 10/21/2020/2:06:21 PM    Final    VAS [place] UPPER EXTREMITY VENOUS DUPLEX  Result Date: 10/28/2020 UPPER VENOUS STUDY  Indications: Swelling, and Edema Risk Factors: Immobility. Performing Technologist: Jannet Askew RCT RDMS  Examination Guidelines: A complete evaluation includes B-mode imaging, spectral Doppler, color Doppler, and power Doppler as needed of all accessible portions of each vessel. Bilateral testing is considered an integral part of a complete examination. Limited examinations for reoccurring indications may be performed as noted.  Left Findings: +----------+------------+---------+-----------+----------------+-------+ LEFT      CompressiblePhasicitySpontaneous   Properties   Summary +----------+------------+---------+-----------+----------------+-------+ IJV           Full        Yes       Yes                            +----------+------------+---------+-----------+----------------+-------+ Subclavian    Full       Yes       Yes                            +----------+------------+---------+-----------+----------------+-------+ Axillary      Full       Yes       Yes                            +----------+------------+---------+-----------+----------------+-------+ Brachial      Full       Yes       Yes                            +----------+------------+---------+-----------+----------------+-------+ Radial        Full                                                +----------+------------+---------+-----------+----------------+-------+ Ulnar         Full                                                +----------+------------+---------+-----------+----------------+-------+  Cephalic      None                        softly echogenic Acute  +----------+------------+---------+-----------+----------------+-------+ Basilic       None                        softly echogenic Acute  +----------+------------+---------+-----------+----------------+-------+ Thrombus within th eLeft cephalic and basilic veins from mid humerous extending distally.  Summary:  Right: No evidence of thrombosis in the subclavian.  Left: No evidence of deep vein thrombosis in the upper extremity. Findings consistent with acute superficial vein thrombosis involving the left basilic vein and left cephalic vein.  *See table(s) above for measurements and observations.  Diagnosing physician: Lemar Livings MD Electronically signed by Lemar Livings MD on 10/28/2020 at 4:03:47 PM.    Final     Labs:  CBC: Recent Labs    11/02/20 1115 11/03/20 0403 11/04/20 0352 11/05/20 0432  WBC 12.0* 11.1* 7.5 7.4  HGB 8.2* 8.2* 8.5* 8.4*  HCT 27.4* 27.5* 27.6* 28.5*  PLT 400 374 382 431*    COAGS: Recent Labs    09/05/20 1130  INR 1.2  APTT 27    BMP: Recent Labs     09/05/20 1130 09/05/20 1152 09/06/20 0156 09/06/20 0156 09/07/20 1212 10/20/20 2200 11/02/20 0428 11/03/20 0403 11/04/20 0352 11/05/20 0432  NA 142   < > 140   < > 140   < > 134* 135 135 136  K 4.9   < > 4.2   < > 4.4   < > 4.5 4.3 4.1 3.5  CL 110   < > 107   < > 104   < > 98 97* 98 101  CO2 23   < > 22   < > 23   < > 29 29 26 27   GLUCOSE 115*   < > 86   < > 98   < > 189* 142* 134* 112*  BUN 31*   < > 22   < > 16   < > 20 19 16 9   CALCIUM 7.4*   < > 8.0*   < > 9.1   < > 8.4* 8.2* 8.6* 8.7*  CREATININE 2.14*   < > 1.30*   < > 1.26*   < > 1.07 1.04 1.15 1.03  GFRNONAA 32*   < > 58*   < > >60   < > >60 >60 >60 >60  GFRAA 37*  --  >60  --  >60  --   --   --   --   --    < > = values in this interval not displayed.    LIVER FUNCTION TESTS: Recent Labs    09/05/20 1130 09/05/20 1130 10/20/20 2200 10/20/20 2200 10/21/20 0914 10/22/20 0346 11/02/20 0428 11/03/20 0403  BILITOT 0.6  --  0.4  --   --   --  0.6 0.4  AST 15  --  58*  --   --   --  85* 67*  ALT 14  --  44  --   --   --  72* 62*  ALKPHOS 53  --  57  --   --   --  226* 214*  PROT 5.4*  --  6.2*  --   --   --  6.6 6.3*  ALBUMIN 2.8*   < > 3.2*   < > 2.6* 3.0*  1.9* 1.9*   < > = values in this interval not displayed.    TUMOR MARKERS: No results for input(s): AFPTM, CEA, CA199, CHROMGRNA in the last 8760 hours.  Assessment and Plan:  Large hiatal hernia  S/P Paraesophageal hernia repair with a pledgeted felt strips and Percutaneous gastrostomy tube with a 24 French tube By Dr. Cliffton AstersLightfoot on 10/26/2020.  Dr. Fredia SorrowYamagata has reviewed images and chart.  Given the very recent placement of the tube, there is not a mature tract. We can't pull the g-tube, but we can maybe put in a small coax 9 Fr J-tube through G tube.  Will plan for Monday as long as IR schedule allows and barring any other emergency procedures that would postpone this one.  I have attempted to reach Johny Chessebbie Benton, sister, for consent, no answer. Will  try and reach her again Monday.  Thank you for this interesting consult.  I greatly enjoyed meeting [name] and look forward to participating in their care.  A copy of this report was sent to the requesting provider on this date.  Electronically Signed: Gwynneth MacleodWENDY S Pietra Zuluaga, PA-C   11/05/2020, 11:07 AM      I spent a total of 40 Minutes  in face to face in clinical consultation, greater than 50% of which was counseling/coordinating care for J-tube through existing Gtube.

## 2020-11-05 NOTE — Progress Notes (Signed)
Assisted tele visit to patient with family member.  Joycelyn Liska Harold, RN  

## 2020-11-06 DIAGNOSIS — J9601 Acute respiratory failure with hypoxia: Secondary | ICD-10-CM | POA: Diagnosis not present

## 2020-11-06 DIAGNOSIS — K56699 Other intestinal obstruction unspecified as to partial versus complete obstruction: Secondary | ICD-10-CM | POA: Diagnosis not present

## 2020-11-06 DIAGNOSIS — R000 Tachycardia, unspecified: Secondary | ICD-10-CM

## 2020-11-06 DIAGNOSIS — K449 Diaphragmatic hernia without obstruction or gangrene: Secondary | ICD-10-CM | POA: Diagnosis not present

## 2020-11-06 DIAGNOSIS — E039 Hypothyroidism, unspecified: Secondary | ICD-10-CM | POA: Diagnosis not present

## 2020-11-06 LAB — GLUCOSE, CAPILLARY
Glucose-Capillary: 101 mg/dL — ABNORMAL HIGH (ref 70–99)
Glucose-Capillary: 113 mg/dL — ABNORMAL HIGH (ref 70–99)
Glucose-Capillary: 117 mg/dL — ABNORMAL HIGH (ref 70–99)
Glucose-Capillary: 117 mg/dL — ABNORMAL HIGH (ref 70–99)
Glucose-Capillary: 121 mg/dL — ABNORMAL HIGH (ref 70–99)
Glucose-Capillary: 133 mg/dL — ABNORMAL HIGH (ref 70–99)
Glucose-Capillary: 99 mg/dL (ref 70–99)

## 2020-11-06 MED ORDER — NEOSTIGMINE METHYLSULFATE 10 MG/10ML IV SOLN
0.2500 mg | Freq: Two times a day (BID) | INTRAVENOUS | Status: DC
  Administered 2020-11-06 – 2020-11-08 (×5): 0.25 mg via SUBCUTANEOUS
  Filled 2020-11-06 (×8): qty 0.25

## 2020-11-06 MED ORDER — NEOSTIGMINE METHYLSULFATE 10 MG/10ML IV SOLN
0.2500 mg | Freq: Four times a day (QID) | INTRAVENOUS | Status: DC
  Filled 2020-11-06 (×3): qty 0.25

## 2020-11-06 MED ORDER — CHLORHEXIDINE GLUCONATE 0.12 % MT SOLN
OROMUCOSAL | Status: AC
  Filled 2020-11-06: qty 15

## 2020-11-06 MED ORDER — LORAZEPAM 2 MG/ML IJ SOLN: 1.0000 mg | Freq: Four times a day (QID) | INTRAMUSCULAR | Status: DC | PRN

## 2020-11-06 MED ORDER — NEOSTIGMINE METHYLSULFATE 10 MG/10ML IV SOLN
0.2500 mg | Freq: Four times a day (QID) | INTRAVENOUS | Status: DC
  Filled 2020-11-06 (×2): qty 0.25

## 2020-11-06 NOTE — Evaluation (Signed)
Physical Therapy Evaluation Patient Details Name: [name] MRN: [id number] DOB: 17-Apr-1956 Today's Date: 11/06/2020   History of Present Illness  64 y.o male presented 10/21/20 with syncope and fall at his group home. Workup showed large hiatal hernia compressing trachea. +aspiration pnemonia; intubated 10/30; 11/03 hiatal hernia repair with PEG placement; extubated 11/12; encephalopathy; scrotal edema  PMH includes GERD, Barrett esophagus, gastroparesis, HTN, stroke with L sided deficits, craniotomy for brain cyst, schizophrenia, intellectual disability.  Clinical Impression   Pt admitted with above diagnosis. Prior to prolonged hospitalization/ICU stay, pt lived in a group home and walked without a device or assist of another person. Currently he requires +2 mod assist for stand-pivot transfers and not yet able to ambulate. Pt currently with functional limitations due to the deficits listed below (see PT Problem List). Pt will benefit from skilled PT to increase their independence and safety with mobility to allow discharge to the venue listed below.       Follow Up Recommendations CIR    Equipment Recommendations  Other (comment) (TBD next venue)    Recommendations for Other Services Rehab consult     Precautions / Restrictions Precautions Precautions: Fall Precaution Comments: Lt hemiparesis       Mobility  Bed Mobility Overal bed mobility: Needs Assistance Bed Mobility: Supine to Sit     Supine to sit: Min assist;HOB elevated     General bed mobility comments: pt eager to get to [facility]; assist to bring LUE with him (as exiting to his rt); closeguarding as he scooted to EOB in sitting    Transfers Overall transfer level: Needs assistance Equipment used: 2 person hand held assist Transfers: Sit to/from [employer] Sit to Stand: Mod assist;+2 physical assistance;+2 safety/equipment Stand pivot transfers: Mod assist;+2 physical assistance;+2  safety/equipment       General transfer comment: assist for anterior wt-shift over BOS, guarding left knee with no buckling throughout; transfer to left more difficult due to lt inattention; pt moves impulsively   Ambulation/Gait             General Gait Details: pivotal steps only between surfaces with +2 mod assist  Stairs            Wheelchair Mobility    Modified Rankin (Stroke Patients Only)       Balance Overall balance assessment: Needs assistance Sitting-balance support: Feet supported Sitting balance-Leahy Scale: Poor Sitting balance - Comments: closeguarding due to impulsively trying to stand without regard to lines/tubes   Standing balance support: Single extremity supported Standing balance-Leahy Scale: Poor Standing balance comment: Pt with posterior bias                             Pertinent Vitals/Pain Pain Assessment: No/denies pain    Home Living Family/patient expects to be discharged to:: Private residence Living Arrangements: Group Home     Home Access: Ramped entrance       Home Equipment: Walker - 2 wheels;Grab bars - tub/shower Additional Comments: ramped entry, walk in shower     Prior Function Level of Independence: Needs assistance   Gait / Transfers Assistance Needed: Pt ambulated mod I with no device.  Required cues to slow down  ADL's / Homemaking Assistance Needed: Pt was able to dress self, but staff would straighten out his clothes after the fact, and assist with fasters as needed.  He was showering mod I prior to september before hospitalizations, but after that  hospitalization he was requiring assist with shower transfer and showering   Comments: Info provided by pt's sister who is his POA      Hand Dominance   Dominant Hand: Right    Extremity/Trunk Assessment   Upper Extremity Assessment Upper Extremity Assessment: Defer to OT evaluation    Lower Extremity Assessment Lower Extremity Assessment:  Generalized weakness;LLE deficits/detail;RLE deficits/detail RLE Deficits / Details: AROM WFL; strength grossly 4/5 LLE Deficits / Details: no active dorsiflexion, full PROM; hip/knee strength grossly 3+    Cervical / Trunk Assessment Cervical / Trunk Assessment: Normal  Communication   Communication: Expressive difficulties  Cognition Arousal/Alertness: Awake/alert Behavior During Therapy: Impulsive Overall Cognitive Status: No family/caregiver present to determine baseline cognitive functioning                                 General Comments: Pt is highly impulsive.  he follows one step commands consistently when cues provided as he self distracts.  He knows he is at [facility] hospital, but is unsure of the reason       General Comments General comments (skin integrity, edema, etc.): pt on 3.5L supplemental 02.  pt with poor waveform throughout session with sat reading ranging from mid 70s - mid 90s.  HR increased into 120s with activity and RR up to 36.      Exercises     Assessment/Plan    PT Assessment Patient needs continued PT services  PT Problem List Decreased strength;Decreased activity tolerance;Decreased balance;Decreased mobility;Decreased cognition;Decreased knowledge of use of DME;Decreased knowledge of precautions;Decreased safety awareness;Cardiopulmonary status limiting activity       PT Treatment Interventions DME instruction;Gait training;Functional mobility training;Therapeutic activities;Therapeutic exercise;Balance training;Neuromuscular re-education;Cognitive remediation;Patient/family education    PT Goals (Current goals can be found in the Care Plan section)  Acute Rehab PT Goals Patient Stated Goal: to get back to normal per sister  PT Goal Formulation: Patient unable to participate in goal setting Time For Goal Achievement: 11/20/20 Potential to Achieve Goals: Fair    Frequency Min 3X/week   Barriers to discharge        Co-evaluation  PT/OT/SLP Co-Evaluation/Treatment: Yes Reason for Co-Treatment: Necessary to address cognition/behavior during functional activity;For patient/therapist safety;To address functional/ADL transfers PT goals addressed during session: Mobility/safety with mobility;Balance         AM-PAC PT "6 Clicks" Mobility  Outcome Measure Help needed turning from your back to your side while in a flat bed without using bedrails?: A Little Help needed moving from lying on your back to sitting on the side of a flat bed without using bedrails?: A Little Help needed moving to and from a bed to a chair (including a wheelchair)?: A Lot Help needed standing up from a chair using your arms (e.g., wheelchair or bedside chair)?: A Lot Help needed to walk in hospital room?: Total Help needed climbing 3-5 steps with a railing? : Total 6 Click Score: 12    End of Session Equipment Utilized During Treatment: Oxygen Activity Tolerance: Patient limited by fatigue;Treatment limited secondary to medical complications (Comment) (sats decr, HR/RR incr) Patient left: in chair;with call bell/phone within reach;with chair alarm set;with restraints reapplied (rt hand mitt) Nurse Communication: Mobility status;Need for lift equipment (would do well with stedy for transfer) PT Visit Diagnosis: Other abnormalities of gait and mobility (R26.89);Muscle weakness (generalized) (M62.81)    Time: 7408-1448 PT Time Calculation (min) (ACUTE ONLY): 36 min   Charges:  PT Evaluation $PT Eval Moderate Complexity: 1 Mod           Jerolyn Center, PT Pager [phone]   Zena Amos 11/06/2020, 1:33 PM

## 2020-11-06 NOTE — Evaluation (Signed)
Occupational Therapy Evaluation Patient Details Name: [name] MRN: [id number] DOB: [birth year] Today's Date: 11/06/2020    History of Present Illness 64 y.o male presented 10/21/20 with syncope and fall at his group home. Workup showed large hiatal hernia compressing trachea. +aspiration pnemonia; intubated 10/30; 11/03 hiatal hernia repair with PEG placement; extubated 11/12; encephalopathy; scrotal edema  PMH includes GERD, Barrett esophagus, gastroparesis, HTN, stroke with L sided deficits, and schizophrenia, intellectual disability.   Clinical Impression   Pt admitted with above. He demonstrates the below listed deficits and will benefit from continued OT to maximize safety and independence with BADLs.  Pt presents to OT with generalized weakness, impaired cognition, Lt hemiparesis, Lt inattention, impaired balance, decreased activity tolerance.  Pt currently requires mod - total A for ADLs and mod A +2 for functional transfers.  Per his sister, how is POA, pt resides in a group home, and was mod I with ambulation with no AD, and was able to dress self with min A.  Prior to hospitalization in Sept, pt was showering mod I, however, since that hospitalization, he has required assist with showering.  He has Lt hemiparsis with mild Lt inattention at baseline, and is impulsive at baseline.   Due to his high level of independence PTA, and the significant decline in function, feel he would benefit from CIR to allow him to maximize safety and independence with ADLs to allow him to return to group home.  Will follow acutely.       Follow Up Recommendations  CIR;Supervision/Assistance - 24 hour    Equipment Recommendations  None recommended by OT    Recommendations for Other Services Rehab consult     Precautions / Restrictions Precautions Precautions: Fall Precaution Comments: Lt hemiparesis  Restrictions Weight Bearing Restrictions: No      Mobility Bed Mobility Overal bed  mobility: Needs Assistance Bed Mobility: Supine to Sit     Supine to sit: Min assist;HOB elevated     General bed mobility comments: pt eager to get to [facility]; assist to bring LUE with him (as exiting to his rt); closeguarding as he scooted to EOB in sitting    Transfers Overall transfer level: Needs assistance Equipment used: 2 person hand held assist Transfers: Sit to/from [employer] Sit to Stand: Mod assist;+2 physical assistance;+2 safety/equipment Stand pivot transfers: Mod assist;+2 physical assistance;+2 safety/equipment       General transfer comment: assist for anterior wt-shift over BOS, guarding left knee with no buckling throughout; transfer to left more difficult due to lt inattention; pt moves impulsively     Balance Overall balance assessment: Needs assistance Sitting-balance support: Feet supported       Standing balance support: Single extremity supported Standing balance-Leahy Scale: Poor Standing balance comment: Pt requires                             ADL either performed or assessed with clinical judgement   ADL Overall ADL's : Needs assistance/impaired Eating/Feeding: NPO   Grooming: Wash/dry hands;Wash/dry face;Oral care;Moderate assistance;Sitting   Upper Body Bathing: Maximal assistance;Sitting   Lower Body Bathing: Maximal assistance;Sit to/from stand   Upper Body Dressing : Maximal assistance;Sitting   Lower Body Dressing: Total assistance;Sit to/from stand   Toilet Transfer: Moderate assistance;+2 for physical assistance;+2 for safety/equipment;Stand-pivot;BSC Toilet Transfer Details (indicate cue type and reason): Pt impulsive due to urgency.  He required assist for balance, and to guide hips to Lt as he  pushed to Rt.  Toileting- Clothing Manipulation and Hygiene: Total assistance;Sit to/from stand       Functional mobility during ADLs: Moderate assistance;+2 for physical assistance;+2 for safety/equipment        Vision   Additional Comments: TBA further      Perception Perception Perception Tested?: Yes Perception Deficits: Inattention/neglect Inattention/Neglect: Does not attend to left side of body Comments: Per pt's sister, he has a history of Lt inattention     Praxis Praxis Praxis tested?: Deficits Deficits: Organization    Pertinent Vitals/Pain Pain Assessment: No/denies pain     Hand Dominance Right   Extremity/Trunk Assessment Upper Extremity Assessment Upper Extremity Assessment: LUE deficits/detail LUE Deficits / Details: Residual weakness Lt UE from craniotomy and stroke per sister.  She reports pt neglected Lt UE  LUE Sensation: decreased proprioception LUE Coordination: decreased fine motor;decreased gross motor   Lower Extremity Assessment Lower Extremity Assessment: Defer to PT evaluation       Communication Communication Communication: Expressive difficulties   Cognition Arousal/Alertness: Awake/alert Behavior During Therapy: Impulsive Overall Cognitive Status: No family/caregiver present to determine baseline cognitive functioning                                 General Comments: Pt is highly impulsive.  he follows one step commands consistently when cues provided as he self distracts.  He knows he is at [facility] hospital, but is unsure of the reason    General Comments  pt on 3.5L supplemental 02.  pt with poor waveform throughout session with sat reading ranging from mid 70s - mid 90s.  HR increased into 120s with activity and RR up to 36.      Exercises     Shoulder Instructions      Home Living Family/patient expects to be discharged to:: Private residence Living Arrangements: Group Home                               Additional Comments: ramped entry, walk in shower       Prior Functioning/Environment Level of Independence: Needs assistance  Gait / Transfers Assistance Needed: Pt ambulated mod I with no device.   Required cues to slow down ADL's / Homemaking Assistance Needed: Pt was able to dress self, but staff would straighten out his clothes after the fact, and assist with fasters as needed.  He was showering mod I prior to september before hospitalizations, but after that hospitalization he was requiring assist with shower transfer and showering  Communication / Swallowing Assistance Needed: low volume and difficult to understand at times  Comments: Info provided by pt's sister who is his POA         OT Problem List: Decreased strength;Decreased range of motion;Impaired balance (sitting and/or standing);Decreased activity tolerance;Decreased coordination;Decreased cognition;Decreased safety awareness;Decreased knowledge of use of DME or AE;Cardiopulmonary status limiting activity;Impaired sensation;Impaired UE functional use      OT Treatment/Interventions: Self-care/ADL training;Neuromuscular education;DME and/or AE instruction;Therapeutic activities;Cognitive remediation/compensation;Visual/perceptual remediation/compensation;Patient/family education;Balance training;Manual therapy    OT Goals(Current goals can be found in the care plan section) Acute Rehab OT Goals Patient Stated Goal: to get back to normal per sister  OT Goal Formulation: With patient/family Time For Goal Achievement: 11/20/20 Potential to Achieve Goals: Good ADL Goals Pt Will Perform Grooming: with min assist;standing Pt Will Perform Upper Body Bathing: with mod assist;sitting Pt Will Perform Lower  Body Bathing: with mod assist;sit to/from stand Pt Will Perform Upper Body Dressing: with min assist;sitting Pt Will Perform Lower Body Dressing: with mod assist;sit to/from stand Pt Will Transfer to Toilet: with mod assist;ambulating;regular height toilet;bedside commode;grab bars Pt Will Perform Toileting - Clothing Manipulation and hygiene: with mod assist;sit to/from stand Additional ADL Goal #1: Pt will consistently  utillize Lt UE as a gross assist during ADLs  OT Frequency: Min 2X/week   Barriers to D/C:            Co-evaluation PT/OT/SLP Co-Evaluation/Treatment: Yes Reason for Co-Treatment: Complexity of the patient's impairments (multi-system involvement);Necessary to address cognition/behavior during functional activity;For patient/therapist safety;To address functional/ADL transfers   OT goals addressed during session: ADL's and self-care      AM-PAC OT "6 Clicks" Daily Activity     Outcome Measure Help from another person eating meals?: Total Help from another person taking care of personal grooming?: A Lot Help from another person toileting, which includes using toliet, bedpan, or urinal?: A Lot Help from another person bathing (including washing, rinsing, drying)?: A Lot Help from another person to put on and taking off regular upper body clothing?: A Lot Help from another person to put on and taking off regular lower body clothing?: Total 6 Click Score: 10   End of Session Equipment Utilized During Treatment: Gait belt;Oxygen Nurse Communication: Mobility status;Need for lift equipment  Activity Tolerance: Patient tolerated treatment well Patient left: in chair;with call bell/phone within reach;with chair alarm set  OT Visit Diagnosis: Unsteadiness on feet (R26.81);Cognitive communication deficit (R41.841);Hemiplegia and hemiparesis Symptoms and signs involving cognitive functions: Cerebral infarction Hemiplegia - Right/Left: Left Hemiplegia - dominant/non-dominant: Non-Dominant                Time: 0355-9741 OT Time Calculation (min): 37 min Charges:  OT General Charges $OT Visit: 1 Visit OT Evaluation $OT Eval Moderate Complexity: 1 Mod  Eber Jones., OTR/L Acute Rehabilitation Services Pager [phone] Office [phone]   Jeani Hawking M 11/06/2020, 11:50 AM

## 2020-11-06 NOTE — Progress Notes (Addendum)
PROGRESS NOTE    [name]  EBR:[id number] DOB: July 27, 1956 DOA: 10/20/2020 PCP: Opp    Brief Narrative:  [name] is a 64 year old male with past medical history significant for ischemic CVA with left-sided deficits/dysphagia, Barrett's esophagus, GERD, gastroparesis, essential hypertension, hyperlipidemia, iron deficiency anemia, intellectual deficiency, schizophrenia, seizure disorder, hypothyroidism who presented to the ED via EMS from his group home for evaluation of fall and syncopal episode.  Per staff at group home, patient's fall unwitnessed and when trying to assist him to his feet he had a syncopal episode.  He was noted to be pale diaphoretic by EMS with tachycardia with HR 120-130.  SPO2 was noted to be 88% on room air.  CBG 255.  In the ED, afebrile with persistent tachycardia and tachypnea.  Requiring 3 L of supplemental oxygen.  Sodium 136, potassium 4.5, chloride 104, bicarb 19, anion gap 13, BUN 37, creatinine 1.7 with baseline 1.2, glucose 163, SARS-CoV-2 PCR negative.  Influenzinum panel negative.  D-dimer 0.62.  Procalcitonin 0.11, lactic acid 1.5.  Chest x-ray with no acute infiltrate but does note marked severely gastric and esophageal distention.  CT angiogram chest negative for PE.  CT abdomen/pelvis with large, stable gastric hernia with associated esophageal dilation and subsequent mass-effect on the trachea.  EDP discussed case with cardiothoracic surgery, Dr. Kipp Brood will consult.  Admitted to hospitalist service.  Patient was initially admitted to the hospital service on 10/21/2020, patient underwent EGD on 10/21/2020.  Early morning on 10/22/2020, rapid response as patient developed significant respiratory distress and was subsequently intubated and transferred to ICU under the critical care service.  Patient underwent hiatal hernia repair with G-tube placement by cardiothoracic surgery, Dr. Kipp Brood on 10/26/2020.  Patient  was extubated on 11/05/2020.  Transferred back to hospital service on 11/06/2020.   Assessment & Plan:   Principal Problem:   Syncope Active Problems:   Hypothyroidism   Hiatal hernia   Acute hypoxemic respiratory failure (HCC)   Sinus tachycardia   Gastric polyps   S/P repair of paraesophageal hernia   Bowel obstruction (HCC)   Acute hypoxic/hypercapnic respiratory failure, present on admission Aspiration vs ventilator associated pneumonia; Klebsiella PNA and Citrobacter Koseri Sepsis, not present on admission Patient presenting to ED from group home with syncopal episode and was noted to be in respiratory distress.  CT angiogram chest negative for PE and no evidence of acute infiltrate, pleural effusion or pneumothorax.  TTE with LVEF 08-81%, grade 1 diastolic dysfunction, no aortic stenosis, normal IVC.  Etiology likely secondary to his large hiatal hernia with compressive tracheal compression.  Rapid response 10/22/2020 with subsequent intubation for increased work of breathing.  During the initial hospitalization, patient was noted to develop pneumonia aspiration versus ventilator associated pneumonia and was treated with course of Unasyn followed by Bactrim in accordance with tracheal aspirate culture susceptibilities. --Continue supplemental oxygen, titrate to maintain SPO2 greater than 92%; currently on 3.5 L nasal cannula with SPO2 97%.  Large hiatal hernia Barrett's esophagus GERD Gastroparesis CT abdomen/pelvis with large stable gastric hernia with associated esophageal dilation and subsequent mass-effect on the trachea, mild right upper lobe atelectasis.  Cardiothoracic surgery was consulted.  Patient underwent robotic assisted hiatal hernia repair by Dr. Kipp Brood on 10/26/2020 with PEG tube placement.  Postoperatively, course complicated by gastroparesis. --IR consulted by PCCM for consideration of vent tube placement --Protonix 40 mg BID --Reglan 10 mg IV every 6  hours --Decrease neostigmine to 0.20m [place] q12h --Colace 1056mBID --MiraLAX  BID --zofran prn --Appears to be improving, positive bowel sounds with several bowel movements over the past 24 hours, will have nutrition attempt to advance tube feeds  Hypothyroidism --Levothyroxine 75 mcg daily  Schizophrenia Intellectual delay Seizure disorder --Valproic acid 250 mg BID --Lamictal 100 mg daily  Hyperlipidemia: Crestor 20 mg daily  Essential hypertension On benazepril 20 mg p.o. daily and propranolol 10 mg p.o. daily at home. --BP 125/110 this morning --Continue to hold home antihypertensives --Monitor BP closely daily  History of CVA with left-sided deficits and dysphagia CT head without contrast with no acute intracranial abnormality but does note large area encephalomalacia involving right frontal lobe. --Continue tube feeds, nutrition for advancement to goal --PT/OT  Weakness, debility, deconditioning: --continue PT/OT efforts --Likely needs SNF vs LTACH --TOC for assistance   DVT prophylaxis: Heparin Code Status: Full code Family Communication: No family present at bedside this morning  Disposition Plan:  Status is: Inpatient  Remains inpatient appropriate because:Unsafe d/c plan, IV treatments appropriate due to intensity of illness or inability to take PO and Inpatient level of care appropriate due to severity of illness   Dispo: The patient is from: Group home              Anticipated d/c is to: LTAC              Anticipated d/c date is: 3 days              Patient currently is not medically stable to d/c.   Consultants:   PCCM - signed off 11/14  Cardiothoracic surgery  [facility]  Procedures:   Intubation 10/30; extubated 11/13  EGD, Dr. Carlean Purl 10/30  Left [place] CVL 10/30 - 11/13  Robotic assisted hiatal hernia repair 11/3, Dr. Kipp Brood  PEG 11/3  Antimicrobials:   Bactrim 11/10 - 11/15  Unasyn 10/29 - 11/1    Subjective: Patient seen  and examined bedside, resting comfortably.  Continues with mild abdominal distention, per nursing improved since yesterday.  5 bowel movements over the past 24 hours.  No other questions or concerns at this time.  No family present at bedside.  Patient denies headache, no chest pain, no shortness of breath, no abdominal pain.  No acute events overnight per nursing staff.  Objective: Vitals:   11/06/20 0400 11/06/20 0500 11/06/20 0600 11/06/20 0700  BP: (!) 141/93 134/89 (!) 125/110   Pulse:  (!) 104 (!) 102   Resp: (!) 24 (!) 32 (!) 24   Temp:    98.7 F (37.1 C)  TempSrc:    Axillary  SpO2: 98% (!) 81% 97%   Weight:  75.8 kg    Height:        Intake/Output Summary (Last 24 hours) at 11/06/2020 1036 Last data filed at 11/06/2020 0600 Gross per 24 hour  Intake 270 ml  Output 1600 ml  Net -1330 ml   Filed Weights   11/04/20 0500 11/05/20 0400 11/06/20 0500  Weight: 82.6 kg 79.2 kg 75.8 kg    Examination:  General exam: Appears calm and comfortable  Respiratory system: Clear to auscultation. Respiratory effort normal.  On 3.5 L nasal cannula with SPO2 97% Cardiovascular system: S1 & S2 heard, RRR. No JVD, murmurs, rubs, gallops or clicks. No pedal edema. Gastrointestinal system: Abdomen is distended, soft and nontender. No organomegaly or masses felt. Normal bowel sounds heard.  PEG tube site noted Central nervous system: Alert. No focal neurological deficits. Extremities: Symmetric 5 x 5 power. Skin: No rashes,  lesions or ulcers Psychiatry: Judgement and insight appear poor. Mood & affect appropriate.     Data Reviewed: I have personally reviewed following labs and imaging studies  CBC: Recent Labs  Lab 11/02/20 0428 11/02/20 1115 11/03/20 0403 11/04/20 0352 11/05/20 0432  WBC 12.5* 12.0* 11.1* 7.5 7.4  NEUTROABS  --  9.4*  --   --   --   HGB 8.1* 8.2* 8.2* 8.5* 8.4*  HCT 27.6* 27.4* 27.5* 27.6* 28.5*  MCV 87.9 87.3 87.0 86.5 88.0  PLT 364 400 374 382 431*    Basic Metabolic Panel: Recent Labs  Lab 11/02/20 0428 11/03/20 0403 11/04/20 0352 11/05/20 0432  NA 134* 135 135 136  K 4.5 4.3 4.1 3.5  CL 98 97* 98 101  CO2 29 29 26 27   GLUCOSE 189* 142* 134* 112*  BUN 20 19 16 9   CREATININE 1.07 1.04 1.15 1.03  CALCIUM 8.4* 8.2* 8.6* 8.7*  MG 2.1  --  2.0 2.1  PHOS 4.7*  --   --   --    GFR: Estimated Creatinine Clearance: 69.2 mL/min (by C-G formula based on SCr of 1.03 mg/dL). Liver Function Tests: Recent Labs  Lab 11/02/20 0428 11/03/20 0403  AST 85* 67*  ALT 72* 62*  ALKPHOS 226* 214*  BILITOT 0.6 0.4  PROT 6.6 6.3*  ALBUMIN 1.9* 1.9*   No results for input(s): LIPASE, AMYLASE in the last 168 hours. No results for input(s): AMMONIA in the last 168 hours. Coagulation Profile: No results for input(s): INR, PROTIME in the last 168 hours. Cardiac Enzymes: No results for input(s): CKTOTAL, CKMB, CKMBINDEX, TROPONINI in the last 168 hours. BNP (last 3 results) No results for input(s): PROBNP in the last 8760 hours. HbA1C: No results for input(s): HGBA1C in the last 72 hours. CBG: Recent Labs  Lab 11/05/20 1510 11/05/20 1946 11/05/20 2359 11/06/20 0314 11/06/20 0733  GLUCAP 107* 122* 117* 99 101*   Lipid Profile: No results for input(s): CHOL, HDL, LDLCALC, TRIG, CHOLHDL, LDLDIRECT in the last 72 hours. Thyroid Function Tests: No results for input(s): TSH, T4TOTAL, FREET4, T3FREE, THYROIDAB in the last 72 hours. Anemia Panel: No results for input(s): VITAMINB12, FOLATE, FERRITIN, TIBC, IRON, RETICCTPCT in the last 72 hours. Sepsis Labs: No results for input(s): PROCALCITON, LATICACIDVEN in the last 168 hours.  Recent Results (from the past 240 hour(s))  Culture, respiratory (non-expectorated)     Status: None   Collection Time: 10/29/20  7:57 AM   Specimen: Tracheal Aspirate; Respiratory  Result Value Ref Range Status   Specimen Description TRACHEAL ASPIRATE  Final   Special Requests NONE  Final   Gram Stain    Final    RARE WBC PRESENT, PREDOMINANTLY PMN NO ORGANISMS SEEN Performed at [facility], [place] [address], [place], [place] [address]    Culture   Final    FEW KLEBSIELLA PNEUMONIAE FEW CITROBACTER KOSERI    Report Status 11/01/2020 FINAL  Final   Organism ID, Bacteria KLEBSIELLA PNEUMONIAE  Final   Organism ID, Bacteria CITROBACTER KOSERI  Final      Susceptibility   Citrobacter koseri - MIC*    CEFAZOLIN <=4 SENSITIVE Sensitive     CEFEPIME <=0.12 SENSITIVE Sensitive     CEFTAZIDIME <=1 SENSITIVE Sensitive     CEFTRIAXONE <=0.25 SENSITIVE Sensitive     CIPROFLOXACIN <=0.25 SENSITIVE Sensitive     GENTAMICIN <=1 SENSITIVE Sensitive     IMIPENEM <=0.25 SENSITIVE Sensitive     TRIMETH/SULFA <=20 SENSITIVE Sensitive  PIP/TAZO <=4 SENSITIVE Sensitive     * FEW CITROBACTER KOSERI   Klebsiella pneumoniae - MIC*    AMPICILLIN >=32 RESISTANT Resistant     CEFAZOLIN <=4 SENSITIVE Sensitive     CEFEPIME <=0.12 SENSITIVE Sensitive     CEFTAZIDIME <=1 SENSITIVE Sensitive     CEFTRIAXONE <=0.25 SENSITIVE Sensitive     CIPROFLOXACIN <=0.25 SENSITIVE Sensitive     GENTAMICIN <=1 SENSITIVE Sensitive     IMIPENEM <=0.25 SENSITIVE Sensitive     TRIMETH/SULFA <=20 SENSITIVE Sensitive     AMPICILLIN/SULBACTAM 4 SENSITIVE Sensitive     PIP/TAZO <=4 SENSITIVE Sensitive     * FEW KLEBSIELLA PNEUMONIAE         Radiology Studies: No results found.      Scheduled Meds: . chlorhexidine gluconate (MEDLINE KIT)  15 mL Mouth Rinse BID  . Chlorhexidine Gluconate Cloth  6 each Topical Daily  . docusate  100 mg Per Tube BID  . heparin  5,000 Units Subcutaneous Q8H  . influenza vac split quadrivalent PF  0.5 mL Intramuscular Tomorrow-1000  . insulin aspart  0-15 Units Subcutaneous Q4H  . lamoTRIgine  100 mg Per Tube Daily  . levothyroxine  75 mcg Per Tube Q0600  . metoCLOPramide (REGLAN) injection  10 mg Intravenous Q6H  . neostigmine  0.25 mg Subcutaneous Q12H  . oxyCODONE  7.5  mg Per Tube Q6H  . pantoprazole sodium  40 mg Per Tube BID  . polyethylene glycol  17 g Per Tube BID  . QUEtiapine  400 mg Per Tube BID  . rosuvastatin  20 mg Per Tube Daily  . sodium chloride flush  10-40 mL Intracatheter Q12H  . sulfamethoxazole-trimethoprim  20 mL Per Tube Q12H  . valproic acid  250 mg Per Tube BID   Continuous Infusions: . sodium chloride 10 mL/hr at 11/02/20 0600  . feeding supplement (VITAL AF 1.2 CAL) 1,000 mL (11/05/20 1600)     LOS: 16 days    Time spent: 45 minutes spent on chart review, discussion with nursing staff, consultants, updating family and interview/physical exam; more than 50% of that time was spent in counseling and/or coordination of care.    Dnya Hickle J [place] DO [facility] Available via Epic secure chat 7am-7pm After these hours, please refer to coverage provider listed on [email or web address] 11/06/2020, 10:36 AM

## 2020-11-06 NOTE — Progress Notes (Signed)
Inpatient Rehab Admissions Coordinator Note:   Per therapy recommendations, pt was screened for CIR candidacy by Carmeline Kowal, MS CCC-SLP. At this time, Pt. Appears to have functional decline and is a good candidate for CIR. Will place order for rehab consult per protocol.  Please contact me with questions.   Junetta Hearn, MS, CCC-SLP Rehab Admissions Coordinator  [phone] (celll) [phone] (office)  

## 2020-11-07 ENCOUNTER — Inpatient Hospital Stay (HOSPITAL_COMMUNITY): Payer: Medicare Other

## 2020-11-07 DIAGNOSIS — J9601 Acute respiratory failure with hypoxia: Secondary | ICD-10-CM | POA: Diagnosis not present

## 2020-11-07 DIAGNOSIS — K449 Diaphragmatic hernia without obstruction or gangrene: Secondary | ICD-10-CM | POA: Diagnosis not present

## 2020-11-07 DIAGNOSIS — K56699 Other intestinal obstruction unspecified as to partial versus complete obstruction: Secondary | ICD-10-CM | POA: Diagnosis not present

## 2020-11-07 DIAGNOSIS — R55 Syncope and collapse: Secondary | ICD-10-CM | POA: Diagnosis not present

## 2020-11-07 HISTORY — PX: IR GASTR TUBE CONVERT GASTR-JEJ PER W/FL MOD SED: IMG2332

## 2020-11-07 LAB — CBC
HCT: 31.4 % — ABNORMAL LOW (ref 39.0–52.0)
Hemoglobin: 9.2 g/dL — ABNORMAL LOW (ref 13.0–17.0)
MCH: 25.8 pg — ABNORMAL LOW (ref 26.0–34.0)
MCHC: 29.3 g/dL — ABNORMAL LOW (ref 30.0–36.0)
MCV: 88 fL (ref 80.0–100.0)
Platelets: 388 10*3/uL (ref 150–400)
RBC: 3.57 MIL/uL — ABNORMAL LOW (ref 4.22–5.81)
RDW: 18.8 % — ABNORMAL HIGH (ref 11.5–15.5)
WBC: 5.2 10*3/uL (ref 4.0–10.5)
nRBC: 0 % (ref 0.0–0.2)

## 2020-11-07 LAB — GLUCOSE, CAPILLARY
Glucose-Capillary: 107 mg/dL — ABNORMAL HIGH (ref 70–99)
Glucose-Capillary: 133 mg/dL — ABNORMAL HIGH (ref 70–99)
Glucose-Capillary: 127 mg/dL — ABNORMAL HIGH (ref 70–99)
Glucose-Capillary: 86 mg/dL (ref 70–99)
Glucose-Capillary: 146 mg/dL — ABNORMAL HIGH (ref 70–99)

## 2020-11-07 LAB — BASIC METABOLIC PANEL
Anion gap: 9 (ref 5–15)
BUN: 5 mg/dL — ABNORMAL LOW (ref 8–23)
CO2: 26 mmol/L (ref 22–32)
Calcium: 9 mg/dL (ref 8.9–10.3)
Chloride: 103 mmol/L (ref 98–111)
Creatinine, Ser: 1.04 mg/dL (ref 0.61–1.24)
GFR, Estimated: >60 mL/min (ref >60)
Glucose, Bld: 123 mg/dL — ABNORMAL HIGH (ref 70–99)
Potassium: 3.5 mmol/L (ref 3.5–5.1)
Sodium: 138 mmol/L (ref 135–145)

## 2020-11-07 LAB — MAGNESIUM: Magnesium: 1.9 mg/dL (ref 1.7–2.4)

## 2020-11-07 MED ORDER — LIDOCAINE-EPINEPHRINE 1 %-1:100000 IJ SOLN
INTRAMUSCULAR | Status: AC
  Filled 2020-11-07: qty 1

## 2020-11-07 MED ORDER — POTASSIUM CHLORIDE 20 MEQ/15ML (10%) PO SOLN
40.0000 meq | Freq: Once | ORAL | Status: AC
  Administered 2020-11-07: 40 meq
  Filled 2020-11-07: qty 30

## 2020-11-07 MED ORDER — METOCLOPRAMIDE HCL 10 MG PO TABS
10.0000 mg | ORAL_TABLET | Freq: Four times a day (QID) | ORAL | Status: DC
  Administered 2020-11-07 – 2020-11-09 (×8): 10 mg
  Filled 2020-11-07 (×11): qty 1

## 2020-11-07 MED ORDER — IOHEXOL 300 MG/ML  SOLN
50.0000 mL | Freq: Once | INTRAMUSCULAR | Status: AC | PRN
  Administered 2020-11-07: 15 mL

## 2020-11-07 MED ORDER — LIDOCAINE VISCOUS HCL 2 % MT SOLN
OROMUCOSAL | Status: AC
  Filled 2020-11-07: qty 15

## 2020-11-07 MED ORDER — OSMOLITE 1.2 CAL PO LIQD
1000.0000 mL | ORAL | Status: DC
  Administered 2020-11-07 – 2020-11-15 (×10): 1000 mL
  Filled 2020-11-07 (×16): qty 1000

## 2020-11-07 NOTE — Procedures (Signed)
Interventional Radiology Procedure Note  Procedure: Placement of jejunal arm via indwelling gastrostomy tube  Findings: Please refer to procedural dictation for full description. 9 Fr G/J arm placed through indwelling gastrostomy.  Tip of J arm near ligament of Treitz.  Complications: None immediate  Estimated Blood Loss: None  Recommendations: G and J ports ready for immediate use.   Marliss Coots, MD Pager: [phone]

## 2020-11-07 NOTE — Progress Notes (Signed)
CSW spoke with patient's sister [name] - questions answered regarding CIR recommendation. [name] requesting CSW speak with [facility] group home staff - [name] to provide the with contact information for CSW.  Edwin Dada, MSW, LCSW-A Transitions of Care  Clinical Social Worker  [facility] Emergency Departments  Medical ICU [phone]

## 2020-11-07 NOTE — Progress Notes (Signed)
Nutrition Follow-up  DOCUMENTATION CODES:   Not applicable  INTERVENTION:   Continue tube feeds via PEG: - Change to Osmolite 1.2 @ 25 ml/hr and advance by 10 ml q 4 hours to goal rate of 75 ml/hr (1800 ml/day)  Tube feeding regimen at goal provides 2160 kcal, 100 grams of protein, and 1476 ml of H2O (meets 100% of estimated needs).  NUTRITION DIAGNOSIS:   Inadequate oral intake related to acute illness as evidenced by NPO status.  Ongoing  GOAL:   Patient will meet greater than or equal to 90% of their needs  Progressing  MONITOR:   TF tolerance  REASON FOR ASSESSMENT:   Consult Enteral/tube feeding initiation and management  ASSESSMENT:   64 year old male who presented to the ED on 10/28 from his group home after a syncopal episode. PMH of stroke with left-sided deficits and dysphagia, Barrett's esophagus, GERD, gastroparesis, HTN, HLD, iron deficiency anemia, intellectual disability, schizophrenia, seizure disorder, hypothyroidism. Pt was found to have a large hiatal hernia compressing his trachea along with gastric and esophageal distention.  10/29 - EGD showing 10 cm hiatal hernia, 2 small gastric polyps (biopsied), irregular Z-line with hiatal hernia deformity vs short segment Barrett's esophagus (biopsied) 10/30 - transferred to ICU for respiratory distress, intubated 11/01 - Cortrak placed, tip gastric per abdominal x-ray, TF later stopped due to emesis 11/03 - s/p hiatal hernia repair, PEG tube placement, Cortrak removed 11/12 - extubated  Discussed pt with RN and during ICU rounds. RD consulted to slowly advance tube feeds to goal. Orders placed and RN aware. IR has been consulted for PEG with venting G port to help TF tolerance. Per IR note, "may be able to convert standard gastrostomy to GJ with a small, coaxial J-tube placement."  Spoke with pt at bedside. Pt reports that he is doing well. He has no complaints. Pt denies nausea or vomiting and states that he  does feel hungry. Abdomen remains distended but pt is having BMs. Will continue to monitor.  Admit weight: 77 kg Current weight: 73.8 kg  Current TF: Vital AF 1.2 @ 20 ml/hr  Medications reviewed and include: colace, SSI q 4 hours, IV Reglan 10 mg q 6 hours, neostigmine q 12 hours, protonix, miralax,   Labs reviewed: hemoglobin 9.2 CBG's: 107-133 x 24 hours  UOP: 2200 ml x 24 hours I/O's: +7.1 L since admit  Diet Order:   Diet Order            Diet NPO time specified Except for: Ice Chips  Diet effective now                 EDUCATION NEEDS:   No education needs have been identified at this time  Skin:  Skin Assessment: Skin Integrity Issues: Incisions: abdomen  Last BM:  11/06/20  Height:   Ht Readings from Last 1 Encounters:  10/27/20 5\' 4"  (1.626 m)    Weight:   Wt Readings from Last 1 Encounters:  11/07/20 73.8 kg    Ideal Body Weight:  59.1 kg  BMI:  Body mass index is 27.93 kg/m.  Estimated Nutritional Needs:   Kcal:  1900-2100  Protein:  100-110 gm  Fluid:  >/= 1.9 L    11/09/20, MS, RD, LDN Inpatient Clinical Dietitian Please see AMiON for contact information.

## 2020-11-07 NOTE — Progress Notes (Signed)
Inpatient Rehab Admissions Coordinator:   I met with patient to discuss potential CIR candidacy. Pt. Listened and was agreeable but did not appear to understand what I was asking. He was however able to follow simple commands and answer yes/no questions appropriately. I have reached out to patient's group home to determine Pt.'s PLOF and what level of assist that the group home can provide at d/c. Left message with request for callback with [place] and with Pt.'s sister, Mickeal Skinner. Have not yet received a response. [facility] team will follow to continue discussion and determine if Pt. Is an appropriate CIR candidate.   Clemens Catholic, [place], [place] Admissions Coordinator  [phone] ([place]) [phone] (office)

## 2020-11-07 NOTE — Progress Notes (Signed)
PROGRESS NOTE    [name]  AGT:[id number] DOB: 10-30-1956 DOA: 10/20/2020 PCP: [facility]    Brief Narrative:  [name] is a 64 year old male with past medical history significant for ischemic CVA with left-sided deficits/dysphagia, Barrett's esophagus, GERD, gastroparesis, essential hypertension, hyperlipidemia, iron deficiency anemia, intellectual deficiency, schizophrenia, seizure disorder, hypothyroidism who presented to the ED via EMS from his group home for evaluation of fall and syncopal episode.  Per staff at group home, patient's fall unwitnessed and when trying to assist him to his feet he had a syncopal episode.  He was noted to be pale diaphoretic by EMS with tachycardia with HR 120-130.  SPO2 was noted to be 88% on room air.  CBG 255.  In the ED, afebrile with persistent tachycardia and tachypnea.  Requiring 3 L of supplemental oxygen.  Sodium 136, potassium 4.5, chloride 104, bicarb 19, anion gap 13, BUN 37, creatinine 1.7 with baseline 1.2, glucose 163, SARS-CoV-2 PCR negative.  Influenzinum panel negative.  D-dimer 0.62.  Procalcitonin 0.11, lactic acid 1.5.  Chest x-ray with no acute infiltrate but does note marked severely gastric and esophageal distention.  CT angiogram chest negative for PE.  CT abdomen/pelvis with large, stable gastric hernia with associated esophageal dilation and subsequent mass-effect on the trachea.  EDP discussed case with cardiothoracic surgery, Dr. Kipp Brood will consult.  Admitted to hospitalist service.  Patient was initially admitted to the hospital service on 10/21/2020, patient underwent EGD on 10/21/2020.  Early morning on 10/22/2020, rapid response as patient developed significant respiratory distress and was subsequently intubated and transferred to ICU under the critical care service.  Patient underwent hiatal hernia repair with G-tube placement by cardiothoracic surgery, Dr. Kipp Brood on 10/26/2020.  Patient  was extubated on 11/05/2020.  Transferred back to hospital service on 11/06/2020.   Assessment & Plan:   Principal Problem:   Syncope Active Problems:   Hypothyroidism   Hiatal hernia   Acute hypoxemic respiratory failure (HCC)   Sinus tachycardia   Gastric polyps   S/P repair of paraesophageal hernia   Bowel obstruction (HCC)   Acute hypoxic/hypercapnic respiratory failure, present on admission Aspiration vs ventilator associated pneumonia; Klebsiella PNA and Citrobacter Koseri Sepsis, not present on admission Patient presenting to ED from group home with syncopal episode and was noted to be in respiratory distress.  CT angiogram chest negative for PE and no evidence of acute infiltrate, pleural effusion or pneumothorax.  TTE with LVEF 22-48%, grade 1 diastolic dysfunction, no aortic stenosis, normal IVC.  Etiology likely secondary to his large hiatal hernia with compressive tracheal compression.  Rapid response 10/22/2020 with subsequent intubation for increased work of breathing.  During the initial hospitalization, patient was noted to develop pneumonia aspiration versus ventilator associated pneumonia and was treated with course of Unasyn followed by Bactrim in accordance with tracheal aspirate culture susceptibilities. --Continue supplemental oxygen, titrate to maintain SPO2 greater than 92%; currently on 3 L nasal cannula with SPO2 97%.  Large hiatal hernia Barrett's esophagus GERD Gastroparesis CT abdomen/pelvis with large stable gastric hernia with associated esophageal dilation and subsequent mass-effect on the trachea, mild right upper lobe atelectasis.  Cardiothoracic surgery was consulted.  Patient underwent robotic assisted hiatal hernia repair by Dr. Kipp Brood on 10/26/2020 with PEG tube placement.  Postoperatively, course complicated by gastroparesis. --IR consulted by PCCM for consideration of vent tube placement --Protonix 40 mg BID --Reglan 10 mg IV every 6  hours --Decrease neostigmine to 0.7m [place] q12h --Colace 1059mBID --MiraLAX  BID --zofran prn --Appears to be improving, positive bowel sounds with several bowel movements over the past 24-48 hours, will have nutrition attempt to advance tube feeds  Hypothyroidism --Levothyroxine 75 mcg daily  Schizophrenia Intellectual delay Seizure disorder --Valproic acid 250 mg BID --Lamictal 100 mg daily  Hyperlipidemia: Crestor 20 mg daily  Essential hypertension On benazepril 20 mg p.o. daily and propranolol 10 mg p.o. daily at home. --BP 130/83 this morning --Continue to hold home antihypertensives --Monitor BP closely daily  History of CVA with left-sided deficits and dysphagia CT head without contrast with no acute intracranial abnormality but does note large area encephalomalacia involving right frontal lobe. --Continue tube feeds, nutrition for advancement to goal --PT/OT  Weakness, debility, deconditioning: --continue PT/OT efforts --Likely needs SNF vs LTACH vs CIR --TOC for assistance   DVT prophylaxis: Heparin Code Status: Full code Family Communication: No family present at bedside this morning  Disposition Plan:  Status is: Inpatient  Remains inpatient appropriate because:Unsafe d/c plan, IV treatments appropriate due to intensity of illness or inability to take PO and Inpatient level of care appropriate due to severity of illness   Dispo: The patient is from: Group home              Anticipated d/c is to: LTAC vs CIR vs SNF              Anticipated d/c date is: 3 days              Patient currently is not medically stable to d/c. Needs to advance tube feeds to goal   Consultants:   PCCM - signed off 11/14  Cardiothoracic surgery  [facility]  Procedures:   Intubation 10/30; extubated 11/13  EGD, Dr. Carlean Purl 10/30  Left [place] CVL 10/30 - 11/13  Robotic assisted hiatal hernia repair 11/3, Dr. Kipp Brood  PEG 11/3  Antimicrobials:   Bactrim 11/10 -  11/15  Unasyn 10/29 - 11/1    Subjective: Patient seen and examined bedside, resting comfortably.  Confused.  Oxygen and SPO2 sensor off.  Once replaced SPO sensor noted to be hypoxic with SPO2 83% on room air.  Replaced oxygen.  Discussed with nursing.  Will have nutrition attempt to advance tube feeds to goal slowly. No other questions or concerns at this time.  No family present at bedside.  Patient denies headache, no chest pain, no shortness of breath, no abdominal pain.  No acute events overnight per nursing staff.  Objective: Vitals:   11/07/20 0700 11/07/20 0727 11/07/20 0800 11/07/20 0900  BP: 130/72  (!) 142/81 131/86  Pulse: (!) 115   96  Resp: (!) 31  (!) 31 (!) 23  Temp:  98.2 F (36.8 C)    TempSrc:  Oral    SpO2: 97%   98%  Weight:      Height:        Intake/Output Summary (Last 24 hours) at 11/07/2020 1023 Last data filed at 11/07/2020 0900 Gross per 24 hour  Intake 850 ml  Output 2350 ml  Net -1500 ml   Filed Weights   11/05/20 0400 11/06/20 0500 11/07/20 0344  Weight: 79.2 kg 75.8 kg 73.8 kg    Examination:  General exam: Appears calm and comfortable  Respiratory system: Clear to auscultation. Respiratory effort normal.  On 3 L nasal cannula with SPO2 97% Cardiovascular system: S1 & S2 heard, RRR. No JVD, murmurs, rubs, gallops or clicks. No pedal edema. Gastrointestinal system: Abdomen is distended, soft and nontender. No  organomegaly or masses felt. Normal bowel sounds heard.  PEG tube site noted Central nervous system: Alert. No focal neurological deficits. Extremities: Symmetric 5 x 5 power. Skin: No rashes, lesions or ulcers Psychiatry: Judgement and insight appear poor. Mood & affect appropriate.     Data Reviewed: I have personally reviewed following labs and imaging studies  CBC: Recent Labs  Lab 11/02/20 1115 11/03/20 0403 11/04/20 0352 11/05/20 0432 11/07/20 0149  WBC 12.0* 11.1* 7.5 7.4 5.2  NEUTROABS 9.4*  --   --   --   --    HGB 8.2* 8.2* 8.5* 8.4* 9.2*  HCT 27.4* 27.5* 27.6* 28.5* 31.4*  MCV 87.3 87.0 86.5 88.0 88.0  PLT 400 374 382 431* 419   Basic Metabolic Panel: Recent Labs  Lab 11/02/20 0428 11/03/20 0403 11/04/20 0352 11/05/20 0432 11/07/20 0149  NA 134* 135 135 136 138  K 4.5 4.3 4.1 3.5 3.5  CL 98 97* 98 101 103  CO2 29 29 26 27 26   GLUCOSE 189* 142* 134* 112* 123*  BUN 20 19 16 9  5*  CREATININE 1.07 1.04 1.15 1.03 1.04  CALCIUM 8.4* 8.2* 8.6* 8.7* 9.0  MG 2.1  --  2.0 2.1 1.9  PHOS 4.7*  --   --   --   --    GFR: Estimated Creatinine Clearance: 67.7 mL/min (by C-G formula based on SCr of 1.04 mg/dL). Liver Function Tests: Recent Labs  Lab 11/02/20 0428 11/03/20 0403  AST 85* 67*  ALT 72* 62*  ALKPHOS 226* 214*  BILITOT 0.6 0.4  PROT 6.6 6.3*  ALBUMIN 1.9* 1.9*   No results for input(s): LIPASE, AMYLASE in the last 168 hours. No results for input(s): AMMONIA in the last 168 hours. Coagulation Profile: No results for input(s): INR, PROTIME in the last 168 hours. Cardiac Enzymes: No results for input(s): CKTOTAL, CKMB, CKMBINDEX, TROPONINI in the last 168 hours. BNP (last 3 results) No results for input(s): PROBNP in the last 8760 hours. HbA1C: No results for input(s): HGBA1C in the last 72 hours. CBG: Recent Labs  Lab 11/06/20 1555 11/06/20 2020 11/06/20 2341 11/07/20 0428 11/07/20 0725  GLUCAP 121* 113* 117* 107* 133*   Lipid Profile: No results for input(s): CHOL, HDL, LDLCALC, TRIG, CHOLHDL, LDLDIRECT in the last 72 hours. Thyroid Function Tests: No results for input(s): TSH, T4TOTAL, FREET4, T3FREE, THYROIDAB in the last 72 hours. Anemia Panel: No results for input(s): VITAMINB12, FOLATE, FERRITIN, TIBC, IRON, RETICCTPCT in the last 72 hours. Sepsis Labs: No results for input(s): PROCALCITON, LATICACIDVEN in the last 168 hours.  Recent Results (from the past 240 hour(s))  Culture, respiratory (non-expectorated)     Status: None   Collection Time: 10/29/20   7:57 AM   Specimen: Tracheal Aspirate; Respiratory  Result Value Ref Range Status   Specimen Description TRACHEAL ASPIRATE  Final   Special Requests NONE  Final   Gram Stain   Final    RARE WBC PRESENT, PREDOMINANTLY PMN NO ORGANISMS SEEN Performed at [facility], [place] [address], [place], [place] [address]    Culture   Final    FEW KLEBSIELLA PNEUMONIAE FEW CITROBACTER KOSERI    Report Status 11/01/2020 FINAL  Final   Organism ID, Bacteria KLEBSIELLA PNEUMONIAE  Final   Organism ID, Bacteria CITROBACTER KOSERI  Final      Susceptibility   Citrobacter koseri - MIC*    CEFAZOLIN <=4 SENSITIVE Sensitive     CEFEPIME <=0.12 SENSITIVE Sensitive     CEFTAZIDIME <=  1 SENSITIVE Sensitive     CEFTRIAXONE <=0.25 SENSITIVE Sensitive     CIPROFLOXACIN <=0.25 SENSITIVE Sensitive     GENTAMICIN <=1 SENSITIVE Sensitive     IMIPENEM <=0.25 SENSITIVE Sensitive     TRIMETH/SULFA <=20 SENSITIVE Sensitive     PIP/TAZO <=4 SENSITIVE Sensitive     * FEW CITROBACTER KOSERI   Klebsiella pneumoniae - MIC*    AMPICILLIN >=32 RESISTANT Resistant     CEFAZOLIN <=4 SENSITIVE Sensitive     CEFEPIME <=0.12 SENSITIVE Sensitive     CEFTAZIDIME <=1 SENSITIVE Sensitive     CEFTRIAXONE <=0.25 SENSITIVE Sensitive     CIPROFLOXACIN <=0.25 SENSITIVE Sensitive     GENTAMICIN <=1 SENSITIVE Sensitive     IMIPENEM <=0.25 SENSITIVE Sensitive     TRIMETH/SULFA <=20 SENSITIVE Sensitive     AMPICILLIN/SULBACTAM 4 SENSITIVE Sensitive     PIP/TAZO <=4 SENSITIVE Sensitive     * FEW KLEBSIELLA PNEUMONIAE         Radiology Studies: No results found.      Scheduled Meds: . chlorhexidine gluconate (MEDLINE KIT)  15 mL Mouth Rinse BID  . Chlorhexidine Gluconate Cloth  6 each Topical Daily  . docusate  100 mg Per Tube BID  . heparin  5,000 Units Subcutaneous Q8H  . influenza vac split quadrivalent PF  0.5 mL Intramuscular Tomorrow-1000  . insulin aspart  0-15 Units Subcutaneous Q4H  . lamoTRIgine  100  mg Per Tube Daily  . levothyroxine  75 mcg Per Tube Q0600  . metoCLOPramide (REGLAN) injection  10 mg Intravenous Q6H  . neostigmine  0.25 mg Subcutaneous Q12H  . oxyCODONE  7.5 mg Per Tube Q6H  . pantoprazole sodium  40 mg Per Tube BID  . polyethylene glycol  17 g Per Tube BID  . QUEtiapine  400 mg Per Tube BID  . rosuvastatin  20 mg Per Tube Daily  . sodium chloride flush  10-40 mL Intracatheter Q12H  . valproic acid  250 mg Per Tube BID   Continuous Infusions: . sodium chloride 10 mL/hr at 11/02/20 0600  . feeding supplement (OSMOLITE 1.2 CAL) 1,000 mL (11/07/20 0956)     LOS: 17 days    Time spent: 39 minutes spent on chart review, discussion with nursing staff, consultants, updating family and interview/physical exam; more than 50% of that time was spent in counseling and/or coordination of care.    Anjelo Pullman J [place] DO [facility] Available via Epic secure chat 7am-7pm After these hours, please refer to coverage provider listed on [email or web address] 11/07/2020, 10:23 AM

## 2020-11-07 NOTE — Progress Notes (Signed)
Assisted tele visit to patient with family member.  [name] D Gerald Foskey, RN   

## 2020-11-07 NOTE — Plan of Care (Signed)
  Problem: Activity: Goal: Ability to tolerate increased activity will improve Outcome: Completed/Met   Problem: Respiratory: Goal: Ability to maintain a clear airway and adequate ventilation will improve Outcome: Completed/Met   Problem: Role Relationship: Goal: Method of communication will improve Outcome: Completed/Met   Problem: Activity: Goal: Ability to tolerate increased activity will improve Outcome: Completed/Met   Problem: Respiratory: Goal: Ability to maintain a clear airway and adequate ventilation will improve Outcome: Completed/Met   Problem: Role Relationship: Goal: Method of communication will improve Outcome: Completed/Met

## 2020-11-07 NOTE — Progress Notes (Signed)
Assisted tele visit to patient with family member.  Anh Bigos Harold, RN  

## 2020-11-08 DIAGNOSIS — K449 Diaphragmatic hernia without obstruction or gangrene: Secondary | ICD-10-CM | POA: Diagnosis not present

## 2020-11-08 DIAGNOSIS — K56699 Other intestinal obstruction unspecified as to partial versus complete obstruction: Secondary | ICD-10-CM | POA: Diagnosis not present

## 2020-11-08 DIAGNOSIS — J9601 Acute respiratory failure with hypoxia: Secondary | ICD-10-CM | POA: Diagnosis not present

## 2020-11-08 DIAGNOSIS — R55 Syncope and collapse: Secondary | ICD-10-CM | POA: Diagnosis not present

## 2020-11-08 LAB — CBC
HCT: 33.2 % — ABNORMAL LOW (ref 39.0–52.0)
Hemoglobin: 9.8 g/dL — ABNORMAL LOW (ref 13.0–17.0)
MCH: 26 pg (ref 26.0–34.0)
MCHC: 29.5 g/dL — ABNORMAL LOW (ref 30.0–36.0)
MCV: 88.1 fL (ref 80.0–100.0)
Platelets: 388 10*3/uL (ref 150–400)
RBC: 3.77 MIL/uL — ABNORMAL LOW (ref 4.22–5.81)
RDW: 19.2 % — ABNORMAL HIGH (ref 11.5–15.5)
WBC: 6.2 10*3/uL (ref 4.0–10.5)
nRBC: 0 % (ref 0.0–0.2)

## 2020-11-08 LAB — GLUCOSE, CAPILLARY
Glucose-Capillary: 131 mg/dL — ABNORMAL HIGH (ref 70–99)
Glucose-Capillary: 163 mg/dL — ABNORMAL HIGH (ref 70–99)
Glucose-Capillary: 144 mg/dL — ABNORMAL HIGH (ref 70–99)
Glucose-Capillary: 114 mg/dL — ABNORMAL HIGH (ref 70–99)
Glucose-Capillary: 144 mg/dL — ABNORMAL HIGH (ref 70–99)
Glucose-Capillary: 160 mg/dL — ABNORMAL HIGH (ref 70–99)

## 2020-11-08 LAB — BASIC METABOLIC PANEL
Anion gap: 10 (ref 5–15)
BUN: 7 mg/dL — ABNORMAL LOW (ref 8–23)
CO2: 28 mmol/L (ref 22–32)
Calcium: 9.1 mg/dL (ref 8.9–10.3)
Chloride: 101 mmol/L (ref 98–111)
Creatinine, Ser: 1.04 mg/dL (ref 0.61–1.24)
GFR, Estimated: >60 mL/min (ref >60)
Glucose, Bld: 146 mg/dL — ABNORMAL HIGH (ref 70–99)
Potassium: 4.1 mmol/L (ref 3.5–5.1)
Sodium: 139 mmol/L (ref 135–145)

## 2020-11-08 MED ORDER — BACITRACIN ZINC 500 UNIT/GM EX OINT
TOPICAL_OINTMENT | Freq: Every day | CUTANEOUS | Status: DC
  Administered 2020-11-12 – 2020-11-13 (×2): 1 via TOPICAL
  Filled 2020-11-08 (×2): qty 28.4

## 2020-11-08 NOTE — Progress Notes (Signed)
Assisted tele visit to patient with family member.  Peace Noyes D Maribel Hadley, RN   

## 2020-11-08 NOTE — Progress Notes (Signed)
CSW spoke with patient's sister [name] who reports the patient's dentures and clothing are missing. CSW and [name] further discussed CIR.   CSW spoke with security officer to determine if the patient's belongings could be located in the lockers - the items are not there.  Edwin Dada, MSW, LCSW-A Transitions of Care  Clinical Social Worker  [facility] Emergency Departments  Medical ICU [phone]

## 2020-11-08 NOTE — Progress Notes (Signed)
Inpatient Rehabilitation-Admissions Coordinator    Had extensive discussion with pt's sister [name] via phone about CIR program, details, and potential barriers. Feel pt is an appropriate candidate but need to address his DC plan. Pt's sister is to contact [facility] services to see what type of assistance they can provide at DC and if they can accommodate a PEG tube. Will continue to work with pt and his sister on a safe dispo to determine if STR at [facility] is the most appropriate placement at this time. Plan to follow up with his sister tomorrow afternoon (3:30PM) per her request.   Cheri Rous, OTR/L  Rehab Admissions Coordinator  [phone] 11/08/2020 4:34 PM

## 2020-11-08 NOTE — Progress Notes (Signed)
Physical Therapy Treatment Patient Details Name: [name] MRN: [id number] DOB: 1956-10-25 Today's Date: 11/08/2020    History of Present Illness 64 y.o male presented 10/21/20 with syncope and fall at his group home. Workup showed large hiatal hernia compressing trachea. +aspiration pnemonia; intubated 10/30; 11/03 hiatal hernia repair with PEG placement; extubated 11/12; encephalopathy; scrotal edema  PMH includes GERD, Barrett esophagus, gastroparesis, HTN, stroke with L sided deficits, craniotomy for brain cyst, schizophrenia, intellectual disability.    PT Comments    Pt alert, participative and with decreased awareness of function and safety. Pt with improved bed mobility, standing and transfer to chair with continued need for +2 assist for safety with hemiparesis, decreased cognition and safety.   SpO2 on RA 95% with drop to 87% with activity on 2l maintained 89-95%. Pt on 1L at 97% end of session Pre activity BP 126/66, post 139/89 HR 89     Follow Up Recommendations  CIR;Supervision/Assistance - 24 hour     Equipment Recommendations  Other (comment) (TBD with progression)    Recommendations for Other Services       Precautions / Restrictions Precautions Precautions: Fall Precaution Comments: Lt hemiparesis, PEG    Mobility  Bed Mobility Overal bed mobility: Needs Assistance Bed Mobility: Supine to Sit     Supine to sit: Min assist;HOB elevated     General bed mobility comments: min assist to EOB with increased time and guarding for lines and safety  Transfers Overall transfer level: Needs assistance   Transfers: Sit to/from Stand;Stand Pivot Transfers Sit to Stand: Min assist;Mod assist Stand pivot transfers: Mod assist;+2 safety/equipment       General transfer comment: pt with initial stand from bed with mod assist then repeated sit to stand x 3 from chair with min assist with increased time effort and attempts to initiate standing with use  of RUE on armrest and increased time to achieve anterior translation. Right knee blocked.  Ambulation/Gait Ambulation/Gait assistance: Mod assist Gait Distance (Feet): 3 Feet Assistive device: 2 person hand held assist     Gait velocity interpretation: 1.31 - 2.62 ft/sec, indicative of limited community ambulator General Gait Details: mod assist with bil UE support to step grossly 3' from bed to chair then stand again and step grossly 1' forward and back   Stairs             Wheelchair Mobility    Modified Rankin (Stroke Patients Only)       Balance Overall balance assessment: Needs assistance   Sitting balance-Leahy Scale: Fair Sitting balance - Comments: minguard EOB   Standing balance support: Single extremity supported Standing balance-Leahy Scale: Poor Standing balance comment: assist to maintain balance with right lean                            Cognition Arousal/Alertness: Awake/alert Behavior During Therapy: Impulsive Overall Cognitive Status: No family/caregiver present to determine baseline cognitive functioning                                 General Comments: pt somewhat impulsive but limited awareness of deficits and needs increased time to initiate and complete mobility      Exercises      General Comments        Pertinent Vitals/Pain Pain Assessment: No/denies pain    Home Living  Prior Function            PT Goals (current goals can now be found in the care plan section) Progress towards PT goals: Progressing toward goals    Frequency    Min 3X/week      PT Plan Current plan remains appropriate    Co-evaluation              AM-PAC PT "6 Clicks" Mobility   Outcome Measure  Help needed turning from your back to your side while in a flat bed without using bedrails?: A Little Help needed moving from lying on your back to sitting on the side of a flat bed without  using bedrails?: A Little Help needed moving to and from a bed to a chair (including a wheelchair)?: A Lot Help needed standing up from a chair using your arms (e.g., wheelchair or bedside chair)?: A Lot Help needed to walk in hospital room?: A Lot Help needed climbing 3-5 steps with a railing? : Total 6 Click Score: 13    End of Session Equipment Utilized During Treatment: Oxygen;Gait belt Activity Tolerance: Patient tolerated treatment well Patient left: in chair;with call bell/phone within reach;with nursing/sitter in room;with chair alarm set;Other (comment) (mitten reapplied) Nurse Communication: Mobility status PT Visit Diagnosis: Other abnormalities of gait and mobility (R26.89);Muscle weakness (generalized) (M62.81)     Time: 4403-4742 PT Time Calculation (min) (ACUTE ONLY): 24 min  Charges:  $Gait Training: 8-22 mins $Therapeutic Activity: 8-22 mins                     [name] P, PT Acute Rehabilitation Services Pager: [phone] Office: [phone]    [name] 11/08/2020, 11:46 AM

## 2020-11-08 NOTE — Progress Notes (Signed)
Occupational Therapy Treatment Patient Details Name: [name] MRN: [id number] DOB: 1956/01/27 Today's Date: 11/08/2020    History of present illness 64 y.o male presented 10/21/20 with syncope and fall at his group home. Workup showed large hiatal hernia compressing trachea. +aspiration pnemonia; intubated 10/30; 11/03 hiatal hernia repair with PEG placement; extubated 11/12; encephalopathy; scrotal edema  PMH includes GERD, Barrett esophagus, gastroparesis, HTN, stroke with L sided deficits, craniotomy for brain cyst, schizophrenia, intellectual disability.   OT comments  Patient seated in recliner up on entry, sliding out of chair with O2 off.  Therapist replaced O2 and assisted pt with repositioning in chair, RN assisting with transfer from recliner to EOB with mod assist +2.  Patient requires cueing for sequencing, technique, problem solving, safety awareness due to impulsivity.  Returned to supine with mod assist, and engaged in grooming from bed level --min assist to wash face and hands given cueing for thoroughness.  Noted HR upon entry 120-126, decreased to 106 once supine in bed; SpO2 maintained when 2L on.  Will follow acutely. CIR remains appropriate.    Follow Up Recommendations  CIR;Supervision/Assistance - 24 hour    Equipment Recommendations  None recommended by OT    Recommendations for Other Services Rehab consult    Precautions / Restrictions Precautions Precautions: Fall Precaution Comments: Lt hemiparesis, PEG Restrictions Weight Bearing Restrictions: No       Mobility Bed Mobility Overal bed mobility: Needs Assistance Bed Mobility: Sit to Supine     Supine to sit: Min assist;HOB elevated Sit to supine: Mod assist   General bed mobility comments: for LB support and guiding of trunk to supine   Transfers Overall transfer level: Needs assistance   Transfers: Sit to/from Stand;Stand Pivot Transfers Sit to Stand: Mod assist;+2  safety/equipment;+2 physical assistance Stand pivot transfers: Mod assist;+2 safety/equipment;+2 physical assistance       General transfer comment: pt completing sit to stand from recliner with mod assist +2, transitioned to EOB pivoting (supporting R UE on RW) with multimodal cueing and increased time (several attempts from recliner required, initally mod assist +2 and sat back down; 2nd sit to stand min assist)    Balance Overall balance assessment: Needs assistance Sitting-balance support: Feet supported;No upper extremity supported Sitting balance-Leahy Scale: Fair Sitting balance - Comments: min guard sitting unsupported in recliner    Standing balance support: Bilateral upper extremity supported;During functional activity Standing balance-Leahy Scale: Poor Standing balance comment: UE and external support                           ADL either performed or assessed with clinical judgement   ADL Overall ADL's : Needs assistance/impaired Eating/Feeding: NPO   Grooming: Wash/dry hands;Wash/dry face;Minimal assistance;Bed level Grooming Details (indicate cue type and reason): increased time, cueing for thoroughness of tasks                  Toilet Transfer: Moderate assistance;+2 for physical assistance;+2 for safety/equipment;RW Toilet Transfer Details (indicate cue type and reason): stand pivot from recliner to EOB          Functional mobility during ADLs: Moderate assistance;+2 for physical assistance;+2 for safety/equipment General ADL Comments: pt limited by cognition, safety awareness, weakness, L hemi     Vision       Perception     Praxis      Cognition Arousal/Alertness: Awake/alert Behavior During Therapy: Impulsive Overall Cognitive Status: No family/caregiver present to determine baseline cognitive  functioning                                 General Comments: pt with decreased safety awareness, impulsive, follows simple  commands with increased time         Exercises     Shoulder Instructions       General Comments HR 120-126 upon entry with O2 2L OFF--therapist donned, HR decreased to 106 once supine in bed    Pertinent Vitals/ Pain       Pain Assessment: No/denies pain  Home Living                                          Prior Functioning/Environment              Frequency  Min 2X/week        Progress Toward Goals  OT Goals(current goals can now be found in the care plan section)  Progress towards OT goals: Progressing toward goals  Acute Rehab OT Goals Patient Stated Goal: to get back to normal per sister  OT Goal Formulation: With patient/family  Plan Discharge plan remains appropriate;Frequency remains appropriate    Co-evaluation                 AM-PAC OT "6 Clicks" Daily Activity     Outcome Measure   Help from another person eating meals?: Total Help from another person taking care of personal grooming?: A Little Help from another person toileting, which includes using toliet, bedpan, or urinal?: A Lot Help from another person bathing (including washing, rinsing, drying)?: A Lot Help from another person to put on and taking off regular upper body clothing?: A Lot Help from another person to put on and taking off regular lower body clothing?: Total 6 Click Score: 11    End of Session Equipment Utilized During Treatment: Gait belt;Oxygen (2L)  OT Visit Diagnosis: Unsteadiness on feet (R26.81);Cognitive communication deficit (R41.841);Hemiplegia and hemiparesis Symptoms and signs involving cognitive functions: Cerebral infarction Hemiplegia - Right/Left: Left Hemiplegia - dominant/non-dominant: Non-Dominant   Activity Tolerance Patient tolerated treatment well   Patient Left in bed;with call bell/phone within reach;with bed alarm set;with nursing/sitter in room;with restraints reapplied   Nurse Communication Mobility status         Time: 4854-6270 OT Time Calculation (min): 21 min  Charges: OT General Charges $OT Visit: 1 Visit OT Treatments $Self Care/Home Management : 8-22 mins  Barry Brunner, OT Acute Rehabilitation Services Pager [phone] Office [phone]    [name] 11/08/2020, 2:42 PM

## 2020-11-08 NOTE — Progress Notes (Signed)
PROGRESS NOTE    [name]  TSV:[id number] DOB: 10/19/1956 DOA: 10/20/2020 PCP: [place]    Brief Narrative:  [name] is a 64 year old male with past medical history significant for ischemic CVA with left-sided deficits/dysphagia, Barrett's esophagus, GERD, gastroparesis, essential hypertension, hyperlipidemia, iron deficiency anemia, intellectual deficiency, schizophrenia, seizure disorder, hypothyroidism who presented to the ED via EMS from his group home for evaluation of fall and syncopal episode.  Per staff at group home, patient's fall unwitnessed and when trying to assist him to his feet he had a syncopal episode.  He was noted to be pale diaphoretic by EMS with tachycardia with HR 120-130.  SPO2 was noted to be 88% on room air.  CBG 255.  In the ED, afebrile with persistent tachycardia and tachypnea.  Requiring 3 L of supplemental oxygen.  Sodium 136, potassium 4.5, chloride 104, bicarb 19, anion gap 13, BUN 37, creatinine 1.7 with baseline 1.2, glucose 163, SARS-CoV-2 PCR negative.  Influenzinum panel negative.  D-dimer 0.62.  Procalcitonin 0.11, lactic acid 1.5.  Chest x-ray with no acute infiltrate but does note marked severely gastric and esophageal distention.  CT angiogram chest negative for PE.  CT abdomen/pelvis with large, stable gastric hernia with associated esophageal dilation and subsequent mass-effect on the trachea.  EDP discussed case with cardiothoracic surgery, Dr. Kipp Brood will consult.  Admitted to hospitalist service.  Patient was initially admitted to the hospital service on 10/21/2020, patient underwent EGD on 10/21/2020.  Early morning on 10/22/2020, rapid response as patient developed significant respiratory distress and was subsequently intubated and transferred to ICU under the critical care service.  Patient underwent hiatal hernia repair with G-tube placement by cardiothoracic surgery, Dr. Kipp Brood on 10/26/2020.  Patient  was extubated on 11/05/2020.  Transferred back to hospital service on 11/06/2020.   Assessment & Plan:   Principal Problem:   Syncope Active Problems:   Hypothyroidism   Hiatal hernia   Acute hypoxemic respiratory failure (HCC)   Sinus tachycardia   Gastric polyps   S/P repair of paraesophageal hernia   Bowel obstruction (HCC)   Acute hypoxic/hypercapnic respiratory failure, present on admission Aspiration vs ventilator associated pneumonia; Klebsiella PNA and Citrobacter Koseri Sepsis, not present on admission Patient presenting to ED from group home with syncopal episode and was noted to be in respiratory distress.  CT angiogram chest negative for PE and no evidence of acute infiltrate, pleural effusion or pneumothorax.  TTE with LVEF 92-33%, grade 1 diastolic dysfunction, no aortic stenosis, normal IVC.  Etiology likely secondary to his large hiatal hernia with compressive tracheal compression.  Rapid response 10/22/2020 with subsequent intubation for increased work of breathing.  During the initial hospitalization, patient was noted to develop pneumonia aspiration versus ventilator associated pneumonia and was treated with course of Unasyn followed by Bactrim in accordance with tracheal aspirate culture susceptibilities. --Continue supplemental oxygen, titrate to maintain SPO2 greater than 92%; currently on 3 L nasal cannula with SPO2 97%.  Large hiatal hernia Barrett's esophagus GERD Gastroparesis CT abdomen/pelvis with large stable gastric hernia with associated esophageal dilation and subsequent mass-effect on the trachea, mild right upper lobe atelectasis.  Cardiothoracic surgery was consulted.  Patient underwent robotic assisted hiatal hernia repair by Dr. Kipp Brood on 10/26/2020 with PEG tube placement and IR placement of jejunal arm vent tube.  Postoperatively, course complicated by gastroparesis. --Protonix 40 mg BID --Reglan 10 mg IV every 6 hours --Decrease neostigmine to  0.50m [place] q12h --Colace 1075mBID --MiraLAX BID --zofran  prn --Appears to be improving, positive bowel sounds with several bowel movements over the past 24-48 hours, tube feeds now advanced to goal and seems to be tolerating  Hypothyroidism --Levothyroxine 75 mcg daily  Schizophrenia Intellectual delay Seizure disorder --Valproic acid 250 mg BID --Lamictal 100 mg daily  Hyperlipidemia: Crestor 20 mg daily  Essential hypertension On benazepril 20 mg p.o. daily and propranolol 10 mg p.o. daily at home. --BP 122/96 this morning --Continue to hold home antihypertensives --Monitor BP closely daily  History of CVA with left-sided deficits and dysphagia CT head without contrast with no acute intracranial abnormality but does note large area encephalomalacia involving right frontal lobe. --Continue tube feeds --PT/OT  Weakness, debility, deconditioning: --continue PT/OT efforts --Likely needs SNF vs LTACH vs CIR --TOC for placement   DVT prophylaxis: Heparin Code Status: Full code Family Communication: No family present at bedside this morning  Disposition Plan:  Status is: Inpatient  Remains inpatient appropriate because:Unsafe d/c plan, IV treatments appropriate due to intensity of illness or inability to take PO and Inpatient level of care appropriate due to severity of illness   Dispo: The patient is from: Group home              Anticipated d/c is to: LTAC vs CIR vs SNF              Anticipated d/c date is: 2 days              Patient currently is not medically stable to d/c.  Need to ensure regular bowel movements and titration off of scheduled Reglan and neostigmine   Consultants:   PCCM - signed off 11/14  Cardiothoracic surgery  [facility]  Procedures:   Intubation 10/30; extubated 11/13  EGD, Dr. Carlean Purl 10/30  Left [place] CVL 10/30 - 11/13  Robotic assisted hiatal hernia repair 11/3, Dr. Kipp Brood  PEG 11/3, jejunal arm vent tube placement IR  11/15  Antimicrobials:   Bactrim 11/10 - 11/15  Unasyn 10/29 - 11/1    Subjective: Patient seen and examined bedside, resting comfortably.  Pleasantly confused, nursing present.  Tube feeds now advanced to goal.  Has not required any anxiolytics overnight for agitation.  No other questions or concerns at this time.  No family present at bedside.  Patient denies headache, no chest pain, no shortness of breath, no abdominal pain.  No acute events overnight per nursing staff.  Objective: Vitals:   11/08/20 0600 11/08/20 0741 11/08/20 0800 11/08/20 0900  BP: (!) 122/96  115/89 125/80  Pulse: (!) 119  96 97  Resp: (!) 24     Temp:  99 F (37.2 C)    TempSrc:  Oral    SpO2: 96%  90% 97%  Weight:      Height:        Intake/Output Summary (Last 24 hours) at 11/08/2020 1034 Last data filed at 11/08/2020 0900 Gross per 24 hour  Intake 560 ml  Output 1525 ml  Net -965 ml   Filed Weights   11/06/20 0500 11/07/20 0344 11/08/20 0500  Weight: 75.8 kg 73.8 kg 74.4 kg    Examination:  General exam: Appears calm and comfortable  Respiratory system: Clear to auscultation. Respiratory effort normal.  On 3 L nasal cannula with SPO2 96% Cardiovascular system: S1 & S2 heard, RRR. No JVD, murmurs, rubs, gallops or clicks. No pedal edema. Gastrointestinal system: Abdomen is distended, soft and nontender. No organomegaly or masses felt. Normal bowel sounds heard.  Trocar surgical  sites noted, PEG tube site noted Central nervous system: Alert. No focal neurological deficits. Extremities: Symmetric 5 x 5 power. Skin: No rashes, lesions or ulcers Psychiatry: Judgement and insight appear poor. Mood & affect appropriate.     Data Reviewed: I have personally reviewed following labs and imaging studies  CBC: Recent Labs  Lab 11/02/20 1115 11/03/20 0403 11/04/20 0352 11/05/20 0432 11/07/20 0149  WBC 12.0* 11.1* 7.5 7.4 5.2  NEUTROABS 9.4*  --   --   --   --   HGB 8.2* 8.2* 8.5* 8.4*  9.2*  HCT 27.4* 27.5* 27.6* 28.5* 31.4*  MCV 87.3 87.0 86.5 88.0 88.0  PLT 400 374 382 431* 350   Basic Metabolic Panel: Recent Labs  Lab 11/02/20 0428 11/03/20 0403 11/04/20 0352 11/05/20 0432 11/07/20 0149  NA 134* 135 135 136 138  K 4.5 4.3 4.1 3.5 3.5  CL 98 97* 98 101 103  CO2 29 29 26 27 26   GLUCOSE 189* 142* 134* 112* 123*  BUN 20 19 16 9  5*  CREATININE 1.07 1.04 1.15 1.03 1.04  CALCIUM 8.4* 8.2* 8.6* 8.7* 9.0  MG 2.1  --  2.0 2.1 1.9  PHOS 4.7*  --   --   --   --    GFR: Estimated Creatinine Clearance: 68 mL/min (by C-G formula based on SCr of 1.04 mg/dL). Liver Function Tests: Recent Labs  Lab 11/02/20 0428 11/03/20 0403  AST 85* 67*  ALT 72* 62*  ALKPHOS 226* 214*  BILITOT 0.6 0.4  PROT 6.6 6.3*  ALBUMIN 1.9* 1.9*   No results for input(s): LIPASE, AMYLASE in the last 168 hours. No results for input(s): AMMONIA in the last 168 hours. Coagulation Profile: No results for input(s): INR, PROTIME in the last 168 hours. Cardiac Enzymes: No results for input(s): CKTOTAL, CKMB, CKMBINDEX, TROPONINI in the last 168 hours. BNP (last 3 results) No results for input(s): PROBNP in the last 8760 hours. HbA1C: No results for input(s): HGBA1C in the last 72 hours. CBG: Recent Labs  Lab 11/07/20 1111 11/07/20 1916 11/07/20 2314 11/08/20 0317 11/08/20 0739  GLUCAP 127* 86 146* 131* 163*   Lipid Profile: No results for input(s): CHOL, HDL, LDLCALC, TRIG, CHOLHDL, LDLDIRECT in the last 72 hours. Thyroid Function Tests: No results for input(s): TSH, T4TOTAL, FREET4, T3FREE, THYROIDAB in the last 72 hours. Anemia Panel: No results for input(s): VITAMINB12, FOLATE, FERRITIN, TIBC, IRON, RETICCTPCT in the last 72 hours. Sepsis Labs: No results for input(s): PROCALCITON, LATICACIDVEN in the last 168 hours.  No results found for this or any previous visit (from the past 240 hour(s)).       Radiology Studies: IR GASTR TUBE CONVERT GASTR-JEJ PER W/FL MOD  SED  Result Date: 11/07/2020 INDICATION: 64 year old male with history of recently surgically placed gastrostomy tube now with gastro paresis, therefore jejunal arm extension is requested for feeds. EXAM: CONVERT G-TUBE TO G-JTUBE MEDICATIONS: None. ANESTHESIA/SEDATION: None. CONTRAST:  21m OMNIPAQUE IOHEXOL 300 MG/ML SOLN - administered into the gastric lumen. FLUOROSCOPY TIME:  Fluoroscopy Time: 5 minutes 12 seconds (114 mGy). COMPLICATIONS: None immediate. PROCEDURE: Informed written consent was obtained from the patient after a thorough discussion of the procedural risks, benefits and alternatives. All questions were addressed. Maximal Sterile Barrier Technique was utilized including caps, mask, sterile gowns, sterile gloves, sterile drape, hand hygiene and skin antiseptic. A timeout was performed prior to the initiation of the procedure. Gentle hand injection via the indwelling gastrostomy demonstrated appropriate position within the gastric lumen. The  excess length of the external portion of the gastrostomy tube was cut. A stiff Glidewire was advanced into the gastric lumen but unable to be position within the duodenum. Therefore, over the Glidewire a 7 French flexor angled tip sheath was placed was was directed through the pylorus into the first portion the duodenum. The Glidewire then was able to be passed to the proximal jejunum over which, in a coaxial fashion an angled tip catheter followed. The Glidewire was removed and exchanged for a stiff Amplatz wire. Over the Amplatz wire, a 9 [place] GJ conversion kit was inserted through the indwelling gastrostomy tube. Contrast injection demonstrated the tip of the gastro jejunal tube in the proximal jejunum, just past the ligament of Treitz. Additional injection through the indwelling gastrostomy port demonstrated appropriate flow into the gastric lumen. The ports were flushed with saline. The patient tolerated the procedure well without complication  IMPRESSION: Successful insertion of a 9 French gastrojejunal limb through the indwelling gastrostomy tube. The tip of the jejunal arm is in the proximal jejunum, just distal to the ligament of Treitz. Ruthann Cancer, MD [facility] Radiology Electronically Signed   By: Ruthann Cancer MD   On: 11/07/2020 17:38        Scheduled Meds:  chlorhexidine gluconate (MEDLINE KIT)  15 mL Mouth Rinse BID   Chlorhexidine Gluconate Cloth  6 each Topical Daily   docusate  100 mg Per Tube BID   heparin  5,000 Units Subcutaneous Q8H   influenza vac split quadrivalent PF  0.5 mL Intramuscular Tomorrow-1000   insulin aspart  0-15 Units Subcutaneous Q4H   lamoTRIgine  100 mg Per Tube Daily   levothyroxine  75 mcg Per Tube Q0600   metoCLOPramide  10 mg Per Tube Q6H   neostigmine  0.25 mg Subcutaneous Q12H   oxyCODONE  7.5 mg Per Tube Q6H   pantoprazole sodium  40 mg Per Tube BID   polyethylene glycol  17 g Per Tube BID   QUEtiapine  400 mg Per Tube BID   rosuvastatin  20 mg Per Tube Daily   sodium chloride flush  10-40 mL Intracatheter Q12H   valproic acid  250 mg Per Tube BID   Continuous Infusions:  sodium chloride 10 mL/hr at 11/02/20 0600   feeding supplement (OSMOLITE 1.2 CAL) 65 mL/hr at 11/08/20 0206     LOS: 18 days    Time spent: 37 minutes spent on chart review, discussion with nursing staff, consultants, updating family and interview/physical exam; more than 50% of that time was spent in counseling and/or coordination of care.    Toddrick Sanna J [place] DO [facility] Available via Epic secure chat 7am-7pm After these hours, please refer to coverage provider listed on [email or web address] 11/08/2020, 10:34 AM

## 2020-11-09 DIAGNOSIS — K56699 Other intestinal obstruction unspecified as to partial versus complete obstruction: Secondary | ICD-10-CM | POA: Diagnosis not present

## 2020-11-09 DIAGNOSIS — R55 Syncope and collapse: Secondary | ICD-10-CM | POA: Diagnosis not present

## 2020-11-09 DIAGNOSIS — K449 Diaphragmatic hernia without obstruction or gangrene: Secondary | ICD-10-CM | POA: Diagnosis not present

## 2020-11-09 DIAGNOSIS — J9601 Acute respiratory failure with hypoxia: Secondary | ICD-10-CM | POA: Diagnosis not present

## 2020-11-09 LAB — GLUCOSE, CAPILLARY
Glucose-Capillary: 101 mg/dL — ABNORMAL HIGH (ref 70–99)
Glucose-Capillary: 130 mg/dL — ABNORMAL HIGH (ref 70–99)
Glucose-Capillary: 129 mg/dL — ABNORMAL HIGH (ref 70–99)
Glucose-Capillary: 122 mg/dL — ABNORMAL HIGH (ref 70–99)

## 2020-11-09 LAB — CBC
HCT: 34.4 % — ABNORMAL LOW (ref 39.0–52.0)
Hemoglobin: 10 g/dL — ABNORMAL LOW (ref 13.0–17.0)
MCH: 25.7 pg — ABNORMAL LOW (ref 26.0–34.0)
MCHC: 29.1 g/dL — ABNORMAL LOW (ref 30.0–36.0)
MCV: 88.4 fL (ref 80.0–100.0)
Platelets: 351 10*3/uL (ref 150–400)
RBC: 3.89 MIL/uL — ABNORMAL LOW (ref 4.22–5.81)
RDW: 19.2 % — ABNORMAL HIGH (ref 11.5–15.5)
WBC: 5.8 10*3/uL (ref 4.0–10.5)
nRBC: 0 % (ref 0.0–0.2)

## 2020-11-09 LAB — BASIC METABOLIC PANEL
Anion gap: 10 (ref 5–15)
BUN: 9 mg/dL (ref 8–23)
CO2: 26 mmol/L (ref 22–32)
Calcium: 9 mg/dL (ref 8.9–10.3)
Chloride: 103 mmol/L (ref 98–111)
Creatinine, Ser: 1.01 mg/dL (ref 0.61–1.24)
GFR, Estimated: >60 mL/min (ref >60)
Glucose, Bld: 98 mg/dL (ref 70–99)
Potassium: 4.4 mmol/L (ref 3.5–5.1)
Sodium: 139 mmol/L (ref 135–145)

## 2020-11-09 LAB — MAGNESIUM: Magnesium: 2 mg/dL (ref 1.7–2.4)

## 2020-11-09 MED ORDER — METOCLOPRAMIDE HCL 10 MG PO TABS
10.0000 mg | ORAL_TABLET | Freq: Two times a day (BID) | ORAL | Status: DC
  Administered 2020-11-09 – 2020-11-10 (×2): 10 mg
  Filled 2020-11-09: qty 2
  Filled 2020-11-09 (×2): qty 1

## 2020-11-09 MED ORDER — SODIUM CHLORIDE 0.9% FLUSH
10.0000 mL | INTRAVENOUS | Status: DC | PRN
  Administered 2020-11-12: 10 mL

## 2020-11-09 MED ORDER — SODIUM CHLORIDE 0.9% FLUSH
10.0000 mL | Freq: Two times a day (BID) | INTRAVENOUS | Status: DC
  Administered 2020-11-09 – 2020-11-15 (×11): 10 mL

## 2020-11-09 NOTE — Progress Notes (Signed)
PROGRESS NOTE    [name]  VZD:[id number] DOB: Aug 11, 1956 DOA: 10/20/2020 PCP: [place]    Brief Narrative:  [name] is a 64 year old male with past medical history significant for ischemic CVA with left-sided deficits/dysphagia, Barrett's esophagus, GERD, gastroparesis, essential hypertension, hyperlipidemia, iron deficiency anemia, intellectual deficiency, schizophrenia, seizure disorder, hypothyroidism who presented to the ED via EMS from his group home for evaluation of fall and syncopal episode.  Per staff at group home, patient's fall unwitnessed and when trying to assist him to his feet he had a syncopal episode.  He was noted to be pale diaphoretic by EMS with tachycardia with HR 120-130.  SPO2 was noted to be 88% on room air.  CBG 255.  In the ED, afebrile with persistent tachycardia and tachypnea.  Requiring 3 L of supplemental oxygen.  Sodium 136, potassium 4.5, chloride 104, bicarb 19, anion gap 13, BUN 37, creatinine 1.7 with baseline 1.2, glucose 163, SARS-CoV-2 PCR negative.  Influenzinum panel negative.  D-dimer 0.62.  Procalcitonin 0.11, lactic acid 1.5.  Chest x-ray with no acute infiltrate but does note marked severely gastric and esophageal distention.  CT angiogram chest negative for PE.  CT abdomen/pelvis with large, stable gastric hernia with associated esophageal dilation and subsequent mass-effect on the trachea.  EDP discussed case with cardiothoracic surgery, Dr. Kipp Brood will consult.  Admitted to hospitalist service.  Patient was initially admitted to the hospital service on 10/21/2020, patient underwent EGD on 10/21/2020.  Early morning on 10/22/2020, rapid response as patient developed significant respiratory distress and was subsequently intubated and transferred to ICU under the critical care service.  Patient underwent hiatal hernia repair with G-tube placement by cardiothoracic surgery, Dr. Kipp Brood on 10/26/2020.  Patient  was extubated on 11/05/2020.  Transferred back to hospital service on 11/06/2020.   Assessment & Plan:   Principal Problem:   Syncope Active Problems:   Hypothyroidism   Hiatal hernia   Acute hypoxemic respiratory failure (HCC)   Sinus tachycardia   Gastric polyps   S/P repair of paraesophageal hernia   Bowel obstruction (HCC)   Acute hypoxic/hypercapnic respiratory failure, present on admission Aspiration vs ventilator associated pneumonia; Klebsiella PNA and Citrobacter Koseri Sepsis, not present on admission Patient presenting to ED from group home with syncopal episode and was noted to be in respiratory distress.  CT angiogram chest negative for PE and no evidence of acute infiltrate, pleural effusion or pneumothorax.  TTE with LVEF 29-51%, grade 1 diastolic dysfunction, no aortic stenosis, normal IVC.  Etiology likely secondary to his large hiatal hernia with compressive tracheal compression.  Rapid response 10/22/2020 with subsequent intubation for increased work of breathing.  During the initial hospitalization, patient was noted to develop pneumonia aspiration versus ventilator associated pneumonia and was treated with course of Unasyn followed by Bactrim in accordance with tracheal aspirate culture susceptibilities. --Continue supplemental oxygen, titrate to maintain SPO2 greater than 92%; currently on 4 L nasal cannula with SPO2 97%; oxygen was actually off when in the room by nursing notes he desaturates when he sleeps  Large hiatal hernia Barrett's esophagus GERD Gastroparesis CT abdomen/pelvis with large stable gastric hernia with associated esophageal dilation and subsequent mass-effect on the trachea, mild right upper lobe atelectasis.  Cardiothoracic surgery was consulted.  Patient underwent robotic assisted hiatal hernia repair by Dr. Kipp Brood on 10/26/2020 with PEG tube placement and IR placement of jejunal arm vent tube.  Postoperatively, course complicated by  gastroparesis. --Protonix 40 mg BID --Reglan 10 mg  IV every 6 hours --discontinue neostigmine today --Colace 150m BID --MiraLAX BID --zofran prn --Appears to be improving, positive bowel sounds with several bowel movements over the past 24-48 hours, tube feeds now advanced to goal and seems to be tolerating --Speech therapy consulted today for reevaluation given patient now extubated and bowel function seems to be improved and at goal with tube feeds.  Hypothyroidism --Levothyroxine 75 mcg daily  Schizophrenia Intellectual delay Seizure disorder --Valproic acid 250 mg BID --Lamictal 100 mg daily  Hyperlipidemia: Crestor 20 mg daily  Essential hypertension On benazepril 20 mg p.o. daily and propranolol 10 mg p.o. daily at home. --BP 110/66 this morning --Continue to hold home antihypertensives --Monitor BP closely daily  History of CVA with left-sided deficits and dysphagia CT head without contrast with no acute intracranial abnormality but does note large area encephalomalacia involving right frontal lobe. --Continue tube feeds --PT/OT  Weakness, debility, deconditioning: --continue PT/OT efforts --Likely needs SNF vs LTACH vs CIR --TOC for placement   DVT prophylaxis: Heparin Code Status: Full code Family Communication: No family present at bedside this morning  Disposition Plan:  Status is: Inpatient  Remains inpatient appropriate because:Unsafe d/c plan, IV treatments appropriate due to intensity of illness or inability to take PO and Inpatient level of care appropriate due to severity of illness   Dispo: The patient is from: Group home              Anticipated d/c is to: LTAC vs CIR vs SNF              Anticipated d/c date is: 2 days              Patient currently is not medically stable to d/c.  Need to ensure regular bowel movements and titration off of scheduled Reglan and neostigmine   Consultants:   PCCM - signed off 11/14  Cardiothoracic  surgery  [facility]  Procedures:   Intubation 10/30; extubated 11/13  EGD, Dr. GCarlean Purl10/30  Left [place] CVL 10/30 - 11/13  Robotic assisted hiatal hernia repair 11/3, Dr. LKipp Brood PEG 11/3, jejunal arm vent tube placement IR 11/15  Antimicrobials:   Bactrim 11/10 - 11/15  Unasyn 10/29 - 11/1    Subjective: Patient seen and examined bedside, resting comfortably.  Pleasantly confused, nursing present.  Continues to tolerate tube feeds at goal.  Transferred earlier this morning from ICU to 6 N.  Has not required any anxiolytics overnight for agitation.  No other questions or concerns at this time.  No family present at bedside.  Patient denies headache, no chest pain, no shortness of breath, no abdominal pain.  No acute events overnight per nursing staff.  Objective: Vitals:   11/09/20 0351 11/09/20 0400 11/09/20 0525 11/09/20 1045  BP:  123/78 (!) 172/76 110/66  Pulse:  80 75 96  Resp:  (!) _0 Temp: 97.9 F (36.6 C)  98.8 F (37.1 C) 98 F (36.7 C)  TempSrc: Oral  Oral Axillary  SpO2:  99% 100% 96%  Weight:  74.4 kg    Height:        Intake/Output Summary (Last 24 hours) at 11/09/2020 1052 Last data filed at 11/09/2020 0422 Gross per 24 hour  Intake 1335 ml  Output 1295 ml  Net 40 ml   Filed Weights   11/07/20 0344 11/08/20 0500 11/09/20 0400  Weight: 73.8 kg 74.4 kg 74.4 kg    Examination:  General exam: Appears calm and comfortable  Respiratory  system: Clear to auscultation. Respiratory effort normal.  On 4 L nasal cannula with SPO2 100%; oxygen actually off in room with SPO2 96%; although nursing reports desaturation when he sleeps Cardiovascular system: S1 & S2 heard, RRR. No JVD, murmurs, rubs, gallops or clicks. No pedal edema. Gastrointestinal system: Abdomen is distended, soft and nontender. No organomegaly or masses felt. Normal bowel sounds heard.  Trocar surgical sites noted, PEG tube site noted Central nervous system: Alert. No focal  neurological deficits. Extremities: Symmetric 5 x 5 power. Skin: No rashes, lesions or ulcers Psychiatry: Judgement and insight appear poor. Mood & affect appropriate.     Data Reviewed: I have personally reviewed following labs and imaging studies  CBC: Recent Labs  Lab 11/02/20 1115 11/03/20 0403 11/04/20 0352 11/05/20 0432 11/07/20 0149 11/08/20 1552 11/09/20 0156  WBC 12.0*   < > 7.5 7.4 5.2 6.2 5.8  NEUTROABS 9.4*  --   --   --   --   --   --   HGB 8.2*   < > 8.5* 8.4* 9.2* 9.8* 10.0*  HCT 27.4*   < > 27.6* 28.5* 31.4* 33.2* 34.4*  MCV 87.3   < > 86.5 88.0 88.0 88.1 88.4  PLT 400   < > 382 431* 388 388 351   < > = values in this interval not displayed.   Basic Metabolic Panel: Recent Labs  Lab 11/04/20 0352 11/05/20 0432 11/07/20 0149 11/08/20 1552 11/09/20 0156  NA 135 136 138 139 139  K 4.1 3.5 3.5 4.1 4.4  CL 98 101 103 101 103  CO2 _0 GLUCOSE 134* 112* 123* 146* 98  BUN 16 9 5* 7* 9  CREATININE 1.15 1.03 1.04 1.04 1.01  CALCIUM 8.6* 8.7* 9.0 9.1 9.0  MG 2.0 2.1 1.9  --  2.0   GFR: Estimated Creatinine Clearance: 70 mL/min (by C-G formula based on SCr of 1.01 mg/dL). Liver Function Tests: Recent Labs  Lab 11/03/20 0403  AST 67*  ALT 62*  ALKPHOS 214*  BILITOT 0.4  PROT 6.3*  ALBUMIN 1.9*   No results for input(s): LIPASE, AMYLASE in the last 168 hours. No results for input(s): AMMONIA in the last 168 hours. Coagulation Profile: No results for input(s): INR, PROTIME in the last 168 hours. Cardiac Enzymes: No results for input(s): CKTOTAL, CKMB, CKMBINDEX, TROPONINI in the last 168 hours. BNP (last 3 results) No results for input(s): PROBNP in the last 8760 hours. HbA1C: No results for input(s): HGBA1C in the last 72 hours. CBG: Recent Labs  Lab 11/08/20 1149 11/08/20 1534 11/08/20 1915 11/08/20 2317 11/09/20 0315  GLUCAP 144* 114* 144* 160* 101*   Lipid Profile: No results for input(s): CHOL, HDL, LDLCALC, TRIG,  CHOLHDL, LDLDIRECT in the last 72 hours. Thyroid Function Tests: No results for input(s): TSH, T4TOTAL, FREET4, T3FREE, THYROIDAB in the last 72 hours. Anemia Panel: No results for input(s): VITAMINB12, FOLATE, FERRITIN, TIBC, IRON, RETICCTPCT in the last 72 hours. Sepsis Labs: No results for input(s): PROCALCITON, LATICACIDVEN in the last 168 hours.  No results found for this or any previous visit (from the past 240 hour(s)).       Radiology Studies: IR GASTR TUBE CONVERT GASTR-JEJ PER W/FL MOD SED  Result Date: 11/07/2020 INDICATION: 64 year old male with history of recently surgically placed gastrostomy tube now with gastro paresis, therefore jejunal arm extension is requested for feeds. EXAM: CONVERT G-TUBE TO G-JTUBE MEDICATIONS: None. ANESTHESIA/SEDATION: None. CONTRAST:  16m OMNIPAQUE IOHEXOL 300 MG/ML  SOLN - administered into the gastric lumen. FLUOROSCOPY TIME:  Fluoroscopy Time: 5 minutes 12 seconds (114 mGy). COMPLICATIONS: None immediate. PROCEDURE: Informed written consent was obtained from the patient after a thorough discussion of the procedural risks, benefits and alternatives. All questions were addressed. Maximal Sterile Barrier Technique was utilized including caps, mask, sterile gowns, sterile gloves, sterile drape, hand hygiene and skin antiseptic. A timeout was performed prior to the initiation of the procedure. Gentle hand injection via the indwelling gastrostomy demonstrated appropriate position within the gastric lumen. The excess length of the external portion of the gastrostomy tube was cut. A stiff Glidewire was advanced into the gastric lumen but unable to be position within the duodenum. Therefore, over the Glidewire a 7 French flexor angled tip sheath was placed was was directed through the pylorus into the first portion the duodenum. The Glidewire then was able to be passed to the proximal jejunum over which, in a coaxial fashion an angled tip catheter followed. The  Glidewire was removed and exchanged for a stiff Amplatz wire. Over the Amplatz wire, a 9 [place] GJ conversion kit was inserted through the indwelling gastrostomy tube. Contrast injection demonstrated the tip of the gastro jejunal tube in the proximal jejunum, just past the ligament of Treitz. Additional injection through the indwelling gastrostomy port demonstrated appropriate flow into the gastric lumen. The ports were flushed with saline. The patient tolerated the procedure well without complication IMPRESSION: Successful insertion of a 9 French gastrojejunal limb through the indwelling gastrostomy tube. The tip of the jejunal arm is in the proximal jejunum, just distal to the ligament of Treitz. Ruthann Cancer, MD [facility] Radiology Electronically Signed   By: Ruthann Cancer MD   On: 11/07/2020 17:38        Scheduled Meds:  bacitracin   Topical Daily   chlorhexidine gluconate (MEDLINE KIT)  15 mL Mouth Rinse BID   Chlorhexidine Gluconate Cloth  6 each Topical Daily   docusate  100 mg Per Tube BID   heparin  5,000 Units Subcutaneous Q8H   influenza vac split quadrivalent PF  0.5 mL Intramuscular Tomorrow-1000   insulin aspart  0-15 Units Subcutaneous Q4H   lamoTRIgine  100 mg Per Tube Daily   levothyroxine  75 mcg Per Tube Q0600   metoCLOPramide  10 mg Per Tube Q6H   neostigmine  0.25 mg Subcutaneous Q12H   oxyCODONE  7.5 mg Per Tube Q6H   pantoprazole sodium  40 mg Per Tube BID   polyethylene glycol  17 g Per Tube BID   QUEtiapine  400 mg Per Tube BID   rosuvastatin  20 mg Per Tube Daily   sodium chloride flush  10-40 mL Intracatheter Q12H   valproic acid  250 mg Per Tube BID   Continuous Infusions:  sodium chloride 10 mL/hr at 11/02/20 0600   feeding supplement (OSMOLITE 1.2 CAL) 1,000 mL (11/09/20 0525)     LOS: 19 days    Time spent: 38 minutes spent on chart review, discussion with nursing staff,  consultants, updating family and interview/physical exam; more than 50% of that time was spent in counseling and/or coordination of care.    Scherry Laverne J [place] DO [facility] Available via Epic secure chat 7am-7pm After these hours, please refer to coverage provider listed on [email or web address] 11/09/2020, 10:52 AM

## 2020-11-09 NOTE — Progress Notes (Addendum)
Physical Therapy Treatment Patient Details Name: [name] MRN: [id number] DOB: February 04, 1956 Today's Date: 11/09/2020    History of Present Illness 64 y.o male presented 10/21/20 with syncope and fall at his group home. Workup showed large hiatal hernia compressing trachea. +aspiration pnemonia; intubated 10/30; 11/03 hiatal hernia repair with PEG placement; extubated 11/12; encephalopathy; scrotal edema  PMH includes GERD, Barrett esophagus, gastroparesis, HTN, stroke with L sided deficits, craniotomy for brain cyst, schizophrenia, intellectual disability.    PT Comments    Pt called back into room to assist staff with return transfer training. Pt seated up in chair on arrival, with improved alertness and good command following for 1-step commands. Pt performed sit<>stand with +34modA from chair and stand pivot transfer from chair>bed with +73maxA, pt incontinent during transfer and needed increased assist for stability. Pt needed minA for rolls L/R x2 ea due to soiled bed/gown and dependent for peri-care. Pt continues to benefit from PT services to progress toward functional mobility goals. D/C recs below remain appropriate.   Follow Up Recommendations  CIR;Supervision/Assistance - 24 hour     Equipment Recommendations  Other (comment) (TBD next venue)    Recommendations for Other Services       Precautions / Restrictions Precautions Precautions: Fall Precaution Comments: Lt hemiparesis, PEG Restrictions Weight Bearing Restrictions: No    Mobility  Bed Mobility Overal bed mobility: Needs Assistance Bed Mobility: Sit to Supine;Rolling Rolling: Min assist   Supine to sit: Min assist;+2 for physical assistance     General bed mobility comments: verbal/tactile cues for scooting toward HOB, assist at trunk and BLE due to increased fatigue/incontinence; multiple L/R rolls needed due to soiled sheets/gown/pads as pt incontinent during transfer; pt able to reach cross-body  for opposite rail rolling to L side but increaed assist for R rolling due to LUE wweakness  Transfers Overall transfer level: Needs assistance Equipment used: Rolling walker (2 wheeled) Transfers: Sit to/from [employer] Sit to Stand: +2 safety/equipment;+2 physical assistance;Mod assist Stand pivot transfers: +2 physical assistance;+2 safety/equipment;Max assist       General transfer comment: from chair to EOB with increased assist for return SPT ~56ft laterally due to incontinence and minor LOB as floor slippery, pt maxA for stand to sit EOB  Ambulation/Gait                 Stairs             Wheelchair Mobility    Modified Rankin (Stroke Patients Only)       Balance Overall balance assessment: Needs assistance Sitting-balance support: Feet supported;No upper extremity supported Sitting balance-Leahy Scale: Fair Sitting balance - Comments: min guard to Supervision while pt static sitting EOB   Standing balance support: Bilateral upper extremity supported;During functional activity Standing balance-Leahy Scale: Poor Standing balance comment: UE and external support                            Cognition Arousal/Alertness: Awake/alert Behavior During Therapy: Flat affect (drowsy) Overall Cognitive Status: No family/caregiver present to determine baseline cognitive functioning                                 General Comments: improved alertness in PM session, pt following 1-step commands, needs cues for safety and sequencing; incontinent of bowels  Of note, pt tube feeds beeping while staff typing note, machine says "clog", RN  notified.    Exercises      General Comments General comments (skin integrity, edema, etc.): SpO2 WNL on 2L [place]      Pertinent Vitals/Pain Pain Assessment: Faces Faces Pain Scale: Hurts a little bit Pain Location: abdominal Pain Descriptors / Indicators: Grimacing Pain Intervention(s):  Monitored during session   Vitals:   11/09/20 1045  BP: 110/66  Pulse: 96  Resp: 14  Temp: 98 F (36.7 C)  SpO2: 96%    Home Living                      Prior Function            PT Goals (current goals can now be found in the care plan section) Acute Rehab PT Goals Patient Stated Goal: to get back to normal per sister  PT Goal Formulation: Patient unable to participate in goal setting Time For Goal Achievement: 11/20/20 Potential to Achieve Goals: Fair Progress towards PT goals: Progressing toward goals    Frequency    Min 3X/week      PT Plan Current plan remains appropriate    Co-evaluation              AM-PAC PT "6 Clicks" Mobility   Outcome Measure  Help needed turning from your back to your side while in a flat bed without using bedrails?: A Little Help needed moving from lying on your back to sitting on the side of a flat bed without using bedrails?: A Little Help needed moving to and from a bed to a chair (including a wheelchair)?: A Lot Help needed standing up from a chair using your arms (e.g., wheelchair or bedside chair)?: A Lot Help needed to walk in hospital room?: A Lot Help needed climbing 3-5 steps with a railing? : Total 6 Click Score: 13    End of Session Equipment Utilized During Treatment: Oxygen;Gait belt Activity Tolerance: Patient tolerated treatment well Patient left: with call bell/phone within reach;in chair;with chair alarm set Nurse Communication: Mobility status PT Visit Diagnosis: Other abnormalities of gait and mobility (R26.89);Muscle weakness (generalized) (M62.81)     Time: 2800-3491 PT Time Calculation (min) (ACUTE ONLY): 31 min  Charges:  $Therapeutic Activity: 23-37 mins                     Jaimin Krupka P., PTA Acute Rehabilitation Services Pager: [phone] Office: [phone]   [place] 11/09/2020, 5:49 PM

## 2020-11-09 NOTE — TOC Progression Note (Addendum)
Transition of Care [facility]) - Progression Note    Patient Details  Name: [name] MRN: [id number] Date of Birth: July 12, 1956  Transition of Care [facility]) CM/SW Contact  Epifanio Lesches, RN Phone Number: [phone] 11/09/2020, 9:28 AM  Clinical Narrative:    Transferred to 5N, 11/09/2020.  Presented to hospital 11/28 with syncope and fall at his group home,[facility]. Workup showed large hiatal hernia compressing trachea. +aspiration pnemonia; intubated 10/30; 11/03 hiatal hernia repair with PEG placement; extubated 11/12; encephalopathy; scrotal edema  PMH includes GERD, Barrett esophagus, gastroparesis, HTN, stroke with L sided deficits, craniotomy for brain cyst, schizophrenia, intellectual disability.   [name]/POA      [phone]      CIR following .... NCM to f/u with SNF backup if needed.  TOC team monitoring and follow for  TOC needs.....  Expected Discharge Plan: IP Rehab Facility (vs SNF) Barriers to Discharge: Continued Medical Work up  Expected Discharge Plan and Services Expected Discharge Plan: IP Rehab Facility (vs SNF)                                               Social Determinants of Health (SDOH) Interventions    Readmission Risk Interventions No flowsheet data found.

## 2020-11-09 NOTE — Progress Notes (Signed)
CSW received call from RN at [facility] regarding patient - CSW provided RN with contact information for assigned [facility] staff member.  Edwin Dada, MSW, LCSW-A Transitions of Care  Clinical Social Worker  [facility] Emergency Departments  Medical ICU [phone]

## 2020-11-09 NOTE — Plan of Care (Signed)
  Problem: Health Behavior/Discharge Planning: Goal: Ability to manage health-related needs will improve Outcome: Progressing   Problem: Clinical Measurements: Goal: Ability to maintain clinical measurements within normal limits will improve Outcome: Progressing Goal: Diagnostic test results will improve Outcome: Progressing   Problem: Coping: Goal: Level of anxiety will decrease Outcome: Progressing   Problem: Elimination: Goal: Will not experience complications related to bowel motility Outcome: Progressing

## 2020-11-09 NOTE — Plan of Care (Signed)
°  Problem: Health Behavior/Discharge Planning: Goal: Ability to manage health-related needs will improve Outcome: Not Progressing   Problem: Clinical Measurements: Goal: Ability to maintain clinical measurements within normal limits will improve Outcome: Not Progressing Goal: Diagnostic test results will improve Outcome: Not Progressing  Impaired cognition

## 2020-11-09 NOTE — Care Management Important Message (Signed)
Important Message  Patient Details  Name: [name] MRN: [id number] Date of Birth: [birth year]   Medicare Important Message Given:  Yes     Oralia Rud Jurnei Latini 11/09/2020, 3:25 PM

## 2020-11-09 NOTE — Progress Notes (Addendum)
Physical Therapy Treatment Patient Details Name: [name] MRN: [id number] DOB: [birth year] Today's Date: 11/09/2020    History of Present Illness 64 y.o male presented 10/21/20 with syncope and fall at his group home. Workup showed large hiatal hernia compressing trachea. +aspiration pnemonia; intubated 10/30; 11/03 hiatal hernia repair with PEG placement; extubated 11/12; encephalopathy; scrotal edema  PMH includes GERD, Barrett esophagus, gastroparesis, HTN, stroke with L sided deficits, craniotomy for brain cyst, schizophrenia, intellectual disability.    PT Comments    Pt supine on arrival, drowsy but awoken and agreeable to therapy session. Pt non-verbal but able to nod head yes/shake head no in response to questions. Pt performed transfers with +76min-modA and bed mobility with minA. Pt denies dizziness/pain throughout session. Once in chair, deferred further mobility due to pt significant drowsiness this date, VSS throughout (see below). Pt continues to benefit from PT services to progress toward functional mobility goals. D/C recs remain appropriate.   Follow Up Recommendations  CIR;Supervision/Assistance - 24 hour     Equipment Recommendations  Other (comment) (TBD next venue)    Recommendations for Other Services       Precautions / Restrictions Precautions Precautions: Fall Precaution Comments: Lt hemiparesis, PEG Restrictions Weight Bearing Restrictions: No    Mobility  Bed Mobility Overal bed mobility: Needs Assistance Bed Mobility: Supine to Sit     Supine to sit: Min assist     General bed mobility comments: tactile cues to initiate, slow to perform, minA at trunk and transfer pad translation to EOB  Transfers Overall transfer level: Needs assistance Equipment used: Rolling walker (2 wheeled) Transfers: Sit to/from [employer] Sit to Stand: Min assist;+2 safety/equipment;+2 physical assistance Stand pivot transfers: Mod assist;+2  physical assistance;+2 safety/equipment       General transfer comment: from EOB to RW with +6minA, needs increased assist for stepping/managing RW for stand pivot transfer ~75ft laterally  Ambulation/Gait                 Stairs             Wheelchair Mobility    Modified Rankin (Stroke Patients Only)       Balance Overall balance assessment: Needs assistance Sitting-balance support: Feet supported;No upper extremity supported Sitting balance-Leahy Scale: Fair Sitting balance - Comments: min guard to Supervision while pt static sitting EOB   Standing balance support: Bilateral upper extremity supported;During functional activity Standing balance-Leahy Scale: Poor Standing balance comment: UE and external support                            Cognition Arousal/Alertness: Awake/alert Behavior During Therapy: Flat affect (drowsy) Overall Cognitive Status: No family/caregiver present to determine baseline cognitive functioning                                 General Comments: pt drowsy, falling asleep seated EOB; needs max verbal cues for eyes open and attention to task; follows 1-step commands with increased time/repetition      Exercises      General Comments General comments (skin integrity, edema, etc.): SpO2 93-100% on 2L O2 [place], HR 80 bpm resting and up to 104 bpm during transfers; BP 109/68 (81) seated in recliner      Pertinent Vitals/Pain Pain Assessment: Faces Faces Pain Scale: Hurts a little bit Pain Location: abdominal Pain Descriptors / Indicators: Grimacing Pain Intervention(s): Monitored during  session;Premedicated before session;Repositioned    Home Living                      Prior Function            PT Goals (current goals can now be found in the care plan section) Acute Rehab PT Goals Patient Stated Goal: to get back to normal per sister  PT Goal Formulation: Patient unable to participate in goal  setting Time For Goal Achievement: 11/20/20 Potential to Achieve Goals: Fair Progress towards PT goals: Progressing toward goals    Frequency    Min 3X/week      PT Plan Current plan remains appropriate    Co-evaluation              AM-PAC PT "6 Clicks" Mobility   Outcome Measure  Help needed turning from your back to your side while in a flat bed without using bedrails?: A Little Help needed moving from lying on your back to sitting on the side of a flat bed without using bedrails?: A Little Help needed moving to and from a bed to a chair (including a wheelchair)?: A Lot Help needed standing up from a chair using your arms (e.g., wheelchair or bedside chair)?: A Lot Help needed to walk in hospital room?: A Lot Help needed climbing 3-5 steps with a railing? : Total 6 Click Score: 13    End of Session Equipment Utilized During Treatment: Oxygen;Gait belt Activity Tolerance: Patient tolerated treatment well Patient left: with call bell/phone within reach;in chair;with chair alarm set Nurse Communication: Mobility status PT Visit Diagnosis: Other abnormalities of gait and mobility (R26.89);Muscle weakness (generalized) (M62.81)     Time: 1430-1500 PT Time Calculation (min) (ACUTE ONLY): 30 min  Charges:  $Therapeutic Activity: 23-37 mins                     Athziri Freundlich P., PTA Acute Rehabilitation Services Pager: [phone] Office: [phone]   [place] 11/09/2020, 3:36 PM

## 2020-11-10 DIAGNOSIS — R55 Syncope and collapse: Secondary | ICD-10-CM | POA: Diagnosis not present

## 2020-11-10 DIAGNOSIS — J9601 Acute respiratory failure with hypoxia: Secondary | ICD-10-CM | POA: Diagnosis not present

## 2020-11-10 DIAGNOSIS — K56699 Other intestinal obstruction unspecified as to partial versus complete obstruction: Secondary | ICD-10-CM | POA: Diagnosis not present

## 2020-11-10 DIAGNOSIS — K449 Diaphragmatic hernia without obstruction or gangrene: Secondary | ICD-10-CM | POA: Diagnosis not present

## 2020-11-10 LAB — CBC
HCT: 33.5 % — ABNORMAL LOW (ref 39.0–52.0)
Hemoglobin: 9.8 g/dL — ABNORMAL LOW (ref 13.0–17.0)
MCH: 25.9 pg — ABNORMAL LOW (ref 26.0–34.0)
MCHC: 29.3 g/dL — ABNORMAL LOW (ref 30.0–36.0)
MCV: 88.6 fL (ref 80.0–100.0)
Platelets: 283 10*3/uL (ref 150–400)
RBC: 3.78 MIL/uL — ABNORMAL LOW (ref 4.22–5.81)
RDW: 19.6 % — ABNORMAL HIGH (ref 11.5–15.5)
WBC: 5.2 10*3/uL (ref 4.0–10.5)
nRBC: 0 % (ref 0.0–0.2)

## 2020-11-10 LAB — GLUCOSE, CAPILLARY
Glucose-Capillary: 107 mg/dL — ABNORMAL HIGH (ref 70–99)
Glucose-Capillary: 115 mg/dL — ABNORMAL HIGH (ref 70–99)
Glucose-Capillary: 116 mg/dL — ABNORMAL HIGH (ref 70–99)
Glucose-Capillary: 132 mg/dL — ABNORMAL HIGH (ref 70–99)
Glucose-Capillary: 136 mg/dL — ABNORMAL HIGH (ref 70–99)
Glucose-Capillary: 143 mg/dL — ABNORMAL HIGH (ref 70–99)
Glucose-Capillary: 173 mg/dL — ABNORMAL HIGH (ref 70–99)

## 2020-11-10 LAB — BASIC METABOLIC PANEL
Anion gap: 13 (ref 5–15)
BUN: 12 mg/dL (ref 8–23)
CO2: 24 mmol/L (ref 22–32)
Calcium: 8.9 mg/dL (ref 8.9–10.3)
Chloride: 100 mmol/L (ref 98–111)
Creatinine, Ser: 0.94 mg/dL (ref 0.61–1.24)
GFR, Estimated: >60 mL/min (ref >60)
Glucose, Bld: 137 mg/dL — ABNORMAL HIGH (ref 70–99)
Potassium: 4.7 mmol/L (ref 3.5–5.1)
Sodium: 137 mmol/L (ref 135–145)

## 2020-11-10 MED ORDER — FREE WATER
74.0000 mL | Status: DC
  Administered 2020-11-10 – 2020-11-15 (×29): 74 mL

## 2020-11-10 MED ORDER — OXYCODONE HCL 5 MG/5ML PO SOLN: 7.5000 mg | Freq: Four times a day (QID) | ORAL | Status: DC | PRN

## 2020-11-10 NOTE — Progress Notes (Signed)
Inpatient Rehabilitation-Admissions Coordinator   Made plans for conference call between pt's sister, [facility], and TOC team to help address post acute DC needs and determine if CIR is best placement for pt at this time. Conference call to take place at 10:00AM tomorrow.  Will continue to follow.   Cheri Rous, OTR/L  Rehab Admissions Coordinator  [phone] 11/10/2020 2:44 PM

## 2020-11-10 NOTE — Progress Notes (Signed)
Patient POA would like to talk to the attending doctor for this patient today about patient care ,  She wants to be notified about every decision about the patient care. Please call her at anytime. Thanks!

## 2020-11-10 NOTE — Progress Notes (Signed)
Nutrition Follow-up  DOCUMENTATION CODES:   Not applicable  INTERVENTION:   Continue tube feeds via G-J: - Osmolite 1.2 @ 75 ml/hr  75 ml free water flush every 6 hours  Tube feeding regimen at goal provides 2160 kcal, 100 grams of protein, and 1476 ml of H2O (meets 100% of estimated needs). Total free water: 1926 ml daily  NUTRITION DIAGNOSIS:   Inadequate oral intake related to acute illness as evidenced by NPO status.  Ongoing  GOAL:   Patient will meet greater than or equal to 90% of their needs  Met with TF  MONITOR:   TF tolerance  REASON FOR ASSESSMENT:   Consult Enteral/tube feeding initiation and management  ASSESSMENT:   64 year old male who presented to the ED on 10/28 from his group home after a syncopal episode. PMH of stroke with left-sided deficits and dysphagia, Barrett's esophagus, GERD, gastroparesis, HTN, HLD, iron deficiency anemia, intellectual disability, schizophrenia, seizure disorder, hypothyroidism. Pt was found to have a large hiatal hernia compressing his trachea along with gastric and esophageal distention.  10/29 - EGD showing 10 cm hiatal hernia, 2 small gastric polyps (biopsied), irregular Z-line with hiatal hernia deformity vs short segment Barrett's esophagus (biopsied) 10/30 - transferred to ICU for respiratory distress, intubated 11/01 - Cortrak placed, tip gastric per abdominal x-ray, TF later stopped due to emesis 11/03 - s/p hiatal hernia repair, PEG tube placement, Cortrak removed 11/12 - extubated 11/15- s/p placement of jejunal arm via indwelling gastrostomy tube  Reviewed I/O's: -1.3 L x 24 hours and -2.6 L since 10/27/20  UOP: 1.3 L x 24 hours  Pt somnolent at time of visit. He did not respond to voice or touch. Per SLP note, mental status could be related to medications and plan per RN is to wean these medications today. SLP to follow-up when pt more alert to determine readiness to PO diet.   Osmolite 1.2 is infusing  via j-port at 75 ml/hr. Regimen providing 2160 kcals, 100 grams protein, and 1476 ml free water, which meets 100% of estimated kcal and protein needs.   Medications reviewed and include colace and miralax.   Per chart review, CIR admissions coordinator and [facility] team working on appropriate discharge disposition.   Labs reviewed: CBGS: 116-143 (inpatient orders for glycemic control are 0-15 units insulin aspart every 4 hours).   Diet Order:   Diet Order            Diet NPO time specified Except for: Ice Chips  Diet effective now                Nutrition-Focused Physical Exam:   Most Recent Value  Orbital Region No depletion  Upper Arm Region No depletion  Thoracic and Lumbar Region No depletion  Buccal Region No depletion  Temple Region No depletion  Clavicle Bone Region No depletion  Clavicle and Acromion Bone Region No depletion  Scapular Bone Region No depletion  Dorsal Hand No depletion  Patellar Region No depletion  Anterior Thigh Region No depletion  Posterior Calf Region No depletion  Edema (RD Assessment) None  Hair Reviewed  Eyes Reviewed  Mouth Reviewed  Skin Reviewed  Nails Reviewed      EDUCATION NEEDS:   No education needs have been identified at this time  Skin:  Skin Assessment: Skin Integrity Issues: Skin Integrity Issues:: Incisions Incisions: abdomen  Last BM:  11/06/20  Height:   Ht Readings from Last 1 Encounters:  10/27/20 5' 4"  (1.626 m)  Weight:   Wt Readings from Last 1 Encounters:  11/10/20 70.3 kg    Ideal Body Weight:  59.1 kg  BMI:  Body mass index is 26.6 kg/m.  Estimated Nutritional Needs:   Kcal:  1900-2100  Protein:  100-110 gm  Fluid:  >/= 1.9 L    Loistine Chance, RD, LDN, [place] Registered Dietitian II Certified Diabetes Care and Education Specialist Please refer to [facility] for RD and/or RD on-call/weekend/after hours pager

## 2020-11-10 NOTE — Plan of Care (Signed)
  Problem: Health Behavior/Discharge Planning: Goal: Ability to manage health-related needs will improve Outcome: Progressing   Problem: Clinical Measurements: Goal: Ability to maintain clinical measurements within normal limits will improve Outcome: Progressing Goal: Diagnostic test results will improve Outcome: Progressing   

## 2020-11-10 NOTE — Evaluation (Signed)
Clinical/Bedside Swallow Evaluation Patient Details  Name: [name] MRN: [id number] Date of Birth: [birth year]  Today's Date: 11/10/2020 Time: SLP Start Time (ACUTE ONLY): 0959 SLP Stop Time (ACUTE ONLY): 1011 SLP Time Calculation (min) (ACUTE ONLY): 12 min  Past Medical History:  Past Medical History:  Diagnosis Date  . Barrett esophagus   . Dysphagia   . Gastroparesis   . GERD (gastroesophageal reflux disease)   . Hypercholesteremia   . Hypertension   . Iron deficiency anemia   . Mental retardation   . Schizophrenia (HCC)   . Seizure [facility]    Past Surgical History:  Past Surgical History:  Procedure Laterality Date  . BIOPSY  10/21/2020   Procedure: BIOPSY;  Surgeon: Iva Boop, MD;  Location: [facility] ENDOSCOPY;  Service: Endoscopy;;  . CRANIOTOMY FOR CYST FENESTRATION    . ESOPHAGOGASTRODUODENOSCOPY N/A 01/09/2015   Procedure: ESOPHAGOGASTRODUODENOSCOPY (EGD);  Surgeon: Louis Meckel, MD;  Location: [facility] ENDOSCOPY;  Service: Endoscopy;  Laterality: N/A;  . ESOPHAGOGASTRODUODENOSCOPY N/A 10/26/2020   Procedure: ESOPHAGOGASTRODUODENOSCOPY (EGD);  Surgeon: Corliss Skains, MD;  Location: [facility] OR;  Service: Thoracic;  Laterality: N/A;  . ESOPHAGOGASTRODUODENOSCOPY (EGD) WITH PROPOFOL N/A 10/21/2020   Procedure: ESOPHAGOGASTRODUODENOSCOPY (EGD) WITH PROPOFOL;  Surgeon: Iva Boop, MD;  Location: [facility] ENDOSCOPY;  Service: Endoscopy;  Laterality: N/A;  . IR GASTR TUBE CONVERT GASTR-JEJ PER W/FL MOD SED  11/07/2020  . PEG PLACEMENT N/A 10/26/2020   Procedure: PERCUTANEOUS ENDOSCOPIC GASTROSTOMY (PEG) PLACEMENT;  Surgeon: Corliss Skains, MD;  Location: [facility] OR;  Service: Thoracic;  Laterality: N/A;  . XI ROBOTIC ASSISTED HIATAL HERNIA REPAIR N/A 10/26/2020   Procedure: XI ROBOTIC ASSISTED HIATAL HERNIA REPAIR;  Surgeon: Corliss Skains, MD;  Location: [facility] OR;  Service: Thoracic;  Laterality: N/A;   HPI:  Pt is a 64 yo male presenting from his group home for  evaluation of fall and syncopal episode. CXR was clear of infiltrates but CT abdomen/pelvis with large, stable gastric hernia with associated esophageal dilation and subsequent mass-effect on the trachea. Pt developed respiratory distress 10/30 requiring intubation. He is s/p [facility] repair with g-tube placement on 11/3; post-op course complicated by gastroparesis. Extubated 11/13. PMH also includes: Barrett's esophagus, GERD, HTN, HOLD, schizophrenia, intellectual delay, CVA with L-sided deficits and dysphagia (MBS in 2008 revealed a mild oropharyngeal dysphagia with recommendations for Dys 3 diet and thin liquids)   Assessment / Plan / Recommendation Clinical Impression  Pt's oropharyngeal swallow appears to be fairly functional during PO trials, but he is quite drowsy and needs consistent stimulation to maintain alertness. Risk for aspiration could be increased by current mentation as well as prolonged intubation, baseline mild oropharyngeal dysphagia from prior CVA, as well as his esophageal issues. Discussed pt with RN, who believes that pt's decreased LOA could be medication related and that the goal is to try to wean these meds today. Therefore, would continue to offer pt ice chips when alert, also continuing to provide frequent oral care. Will f/u to see if pt is more alert before considering PO diet vs MBS in light of multiple risk factors.   SLP Visit Diagnosis: Dysphagia, unspecified (R13.10)    Aspiration Risk  Moderate aspiration risk    Diet Recommendation NPO;Ice chips PRN after oral care   Medication Administration: Via alternative means    Other  Recommendations Oral Care Recommendations: Oral care QID   Follow up Recommendations  (tba)      Frequency and Duration min 2x/week  2 weeks  Prognosis Prognosis for Safe Diet Advancement: Good Barriers to Reach Goals: Cognitive deficits      Swallow Study   General HPI: Pt is a 64 yo male presenting from his group home for  evaluation of fall and syncopal episode. CXR was clear of infiltrates but CT abdomen/pelvis with large, stable gastric hernia with associated esophageal dilation and subsequent mass-effect on the trachea. Pt developed respiratory distress 10/30 requiring intubation. He is s/p [facility] repair with g-tube placement on 11/3; post-op course complicated by gastroparesis. Extubated 11/13. PMH also includes: Barrett's esophagus, GERD, HTN, HOLD, schizophrenia, intellectual delay, CVA with L-sided deficits and dysphagia (MBS in 2008 revealed a mild oropharyngeal dysphagia with recommendations for Dys 3 diet and thin liquids) Type of Study: Bedside Swallow Evaluation Previous Swallow Assessment: see HPI Diet Prior to this Study: NPO;PEG tube Temperature Spikes Noted: No Respiratory Status: Room air History of Recent Intubation: Yes Length of Intubations (days): 14 days Date extubated: 11/05/20 Behavior/Cognition: Lethargic/Drowsy;Requires cueing Oral Cavity Assessment: Within Functional Limits Oral Cavity - Dentition: Edentulous Self-Feeding Abilities: Total assist Patient Positioning: Upright in bed Baseline Vocal Quality: Low vocal intensity Volitional Cough: Cognitively unable to elicit Volitional Swallow: Unable to elicit    Oral/Motor/Sensory Function Overall Oral Motor/Sensory Function:  (not following commands well to assess)   Ice Chips Ice chips: Within functional limits Presentation: Spoon   Thin Liquid Thin Liquid: Within functional limits Presentation: Spoon;Cup;Straw    Nectar Thick Nectar Thick Liquid: Not tested   Honey Thick Honey Thick Liquid: Not tested   Puree Puree: Within functional limits Presentation: Spoon   Solid     Solid: Not tested      Mahala Menghini., M.A. CCC-SLP Acute Rehabilitation Services Pager [phone] Office [phone]  11/10/2020,10:21 AM

## 2020-11-10 NOTE — Progress Notes (Signed)
PROGRESS NOTE    [name]  SNK:[id number] DOB: 1956-05-20 DOA: 10/20/2020 PCP: [place]    Brief Narrative:  [name] is a 64 year old male with past medical history significant for ischemic CVA with left-sided deficits/dysphagia, Barrett's esophagus, GERD, gastroparesis, essential hypertension, hyperlipidemia, iron deficiency anemia, intellectual deficiency, schizophrenia, seizure disorder, hypothyroidism who presented to the ED via EMS from his group home for evaluation of fall and syncopal episode.  Per staff at group home, patient's fall unwitnessed and when trying to assist him to his feet he had a syncopal episode.  He was noted to be pale diaphoretic by EMS with tachycardia with HR 120-130.  SPO2 was noted to be 88% on room air.  CBG 255.  In the ED, afebrile with persistent tachycardia and tachypnea.  Requiring 3 L of supplemental oxygen.  Sodium 136, potassium 4.5, chloride 104, bicarb 19, anion gap 13, BUN 37, creatinine 1.7 with baseline 1.2, glucose 163, SARS-CoV-2 PCR negative.  Influenzinum panel negative.  D-dimer 0.62.  Procalcitonin 0.11, lactic acid 1.5.  Chest x-ray with no acute infiltrate but does note marked severely gastric and esophageal distention.  CT angiogram chest negative for PE.  CT abdomen/pelvis with large, stable gastric hernia with associated esophageal dilation and subsequent mass-effect on the trachea.  EDP discussed case with cardiothoracic surgery, Dr. Kipp Brood will consult.  Admitted to hospitalist service.  Patient was initially admitted to the hospital service on 10/21/2020, patient underwent EGD on 10/21/2020.  Early morning on 10/22/2020, rapid response as patient developed significant respiratory distress and was subsequently intubated and transferred to ICU under the critical care service.  Patient underwent hiatal hernia repair with G-tube placement by cardiothoracic surgery, Dr. Kipp Brood on 10/26/2020.  Patient  was extubated on 11/05/2020.  Transferred back to hospital service on 11/06/2020.   Assessment & Plan:   Principal Problem:   Syncope Active Problems:   Hypothyroidism   Hiatal hernia   Acute hypoxemic respiratory failure (HCC)   Sinus tachycardia   Gastric polyps   S/P repair of paraesophageal hernia   Bowel obstruction (HCC)   Acute hypoxic/hypercapnic respiratory failure, present on admission Aspiration vs ventilator associated pneumonia; Klebsiella PNA and Citrobacter Koseri Sepsis, not present on admission Patient presenting to ED from group home with syncopal episode and was noted to be in respiratory distress.  CT angiogram chest negative for PE and no evidence of acute infiltrate, pleural effusion or pneumothorax.  TTE with LVEF 93-79%, grade 1 diastolic dysfunction, no aortic stenosis, normal IVC.  Etiology likely secondary to his large hiatal hernia with compressive tracheal compression.  Rapid response 10/22/2020 with subsequent intubation for increased work of breathing.  During the initial hospitalization, patient was noted to develop pneumonia aspiration versus ventilator associated pneumonia and was treated with course of Unasyn followed by Bactrim in accordance with tracheal aspirate culture susceptibilities. --Currently oxygenating well on room air, occasionally will desaturate while sleeping; continue to monitor SPO2  Large hiatal hernia Barrett's esophagus GERD Gastroparesis CT abdomen/pelvis with large stable gastric hernia with associated esophageal dilation and subsequent mass-effect on the trachea, mild right upper lobe atelectasis.  Cardiothoracic surgery was consulted.  Patient underwent robotic assisted hiatal hernia repair by Dr. Kipp Brood on 10/26/2020 with PEG tube placement and IR placement of jejunal arm vent tube.  Postoperatively, course complicated by gastroparesis.  Initially requiring neostigmine and Reglan, now discontinued.  Tolerating tube feeds at  goal. --Protonix 40 mg BID --Colace 172m BID --MiraLAX BID --zofran prn --Speech therapy  consulted for reevaluation given patient now extubated and bowel function seems to be improved and at goal with tube feeds.  Hypothyroidism --Levothyroxine 75 mcg daily  Schizophrenia Intellectual delay Seizure disorder --Valproic acid 250 mg BID --Lamictal 100 mg daily  Hyperlipidemia: Crestor 20 mg daily  Essential hypertension On benazepril 20 mg p.o. daily and propranolol 10 mg p.o. daily at home. --BP 110/66 this morning --Continue to hold home antihypertensives --Monitor BP closely daily  History of CVA with left-sided deficits and dysphagia CT head without contrast with no acute intracranial abnormality but does note large area encephalomalacia involving right frontal lobe. --Continue tube feeds --PT/OT  Weakness, debility, deconditioning: --continue PT/OT efforts --Likely needs SNF vs LTACH vs CIR --TOC for placement   DVT prophylaxis: Heparin Code Status: Full code Family Communication: No family present at bedside this morning; attempted to contact patients sister (POA) [name] via telephone this am, unsuccessful  Disposition Plan:  Status is: Inpatient  Remains inpatient appropriate because:Unsafe d/c plan, IV treatments appropriate due to intensity of illness or inability to take PO and Inpatient level of care appropriate due to severity of illness   Dispo: The patient is from: Group home              Anticipated d/c is to: LTAC vs CIR vs SNF              Anticipated d/c date is: 2 days              Patient currently is not medically stable to d/c.  Need to ensure regular bowel movements and titration off of scheduled Reglan and neostigmine   Consultants:   PCCM - signed off 11/14  Cardiothoracic surgery  [facility]  Procedures:   Intubation 10/30; extubated 11/13  EGD, Dr. Carlean Purl 10/30  Left Parker CVL 10/30 - 11/13  Robotic assisted hiatal hernia  repair 11/3, Dr. Kipp Brood  PEG 11/3, jejunal arm vent tube placement IR 11/15  Antimicrobials:   Bactrim 11/10 - 11/15  Unasyn 10/29 - 11/1    Subjective: Patient seen and examined bedside, resting comfortably.  Pleasantly confused, nursing present.  Continues to tolerate tube feeds at goal with several bowel movements past 24 hours.  No family present, attempted to contact sister via telephone unsuccessful. Patient denies headache, no chest pain, no shortness of breath, no abdominal pain.  No acute events overnight per nursing staff.  Objective: Vitals:   11/09/20 2013 11/10/20 0414 11/10/20 0500 11/10/20 0738  BP: 113/64 110/65  113/62  Pulse: 72 70  76  Resp: 16   17  Temp: 98.3 F (36.8 C) 97.9 F (36.6 C)  98.1 F (36.7 C)  TempSrc:  Axillary  Oral  SpO2: 98% 97%  98%  Weight:   70.3 kg   Height:        Intake/Output Summary (Last 24 hours) at 11/10/2020 1002 Last data filed at 11/10/2020 0300 Gross per 24 hour  Intake --  Output 1300 ml  Net -1300 ml   Filed Weights   11/08/20 0500 11/09/20 0400 11/10/20 0500  Weight: 74.4 kg 74.4 kg 70.3 kg    Examination:  General exam: Appears calm and comfortable  Respiratory system: Clear to auscultation. Respiratory effort normal.  Oxygenating well on room air  Cardiovascular system: S1 & S2 heard, RRR. No JVD, murmurs, rubs, gallops or clicks. No pedal edema. Gastrointestinal system: Abdomen soft, ND, and nontender. No organomegaly or masses felt. Normal bowel sounds heard.  Trocar surgical sites noted,  PEG tube site noted Central nervous system: Alert. No focal neurological deficits. Extremities: Symmetric 5 x 5 power. Skin: No rashes, lesions or ulcers Psychiatry: Judgement and insight appear poor. Mood & affect appropriate.     Data Reviewed: I have personally reviewed following labs and imaging studies  CBC: Recent Labs  Lab 11/05/20 0432 11/07/20 0149 11/08/20 1552 11/09/20 0156 11/10/20 0359  WBC  7.4 5.2 6.2 5.8 5.2  HGB 8.4* 9.2* 9.8* 10.0* 9.8*  HCT 28.5* 31.4* 33.2* 34.4* 33.5*  MCV 88.0 88.0 88.1 88.4 88.6  PLT 431* 388 388 351 161   Basic Metabolic Panel: Recent Labs  Lab 11/04/20 0352 11/04/20 0352 11/05/20 0432 11/07/20 0149 11/08/20 1552 11/09/20 0156 11/10/20 0359  NA 135   < > 136 138 139 139 137  K 4.1   < > 3.5 3.5 4.1 4.4 4.7  CL 98   < > 101 103 101 103 100  CO2 26   < > _0 GLUCOSE 134*   < > 112* 123* 146* 98 137*  BUN 16   < > 9 5* 7* 9 12  CREATININE 1.15   < > 1.03 1.04 1.04 1.01 0.94  CALCIUM 8.6*   < > 8.7* 9.0 9.1 9.0 8.9  MG 2.0  --  2.1 1.9  --  2.0  --    < > = values in this interval not displayed.   GFR: Estimated Creatinine Clearance: 68.2 mL/min (by C-G formula based on SCr of 0.94 mg/dL). Liver Function Tests: No results for input(s): AST, ALT, ALKPHOS, BILITOT, PROT, ALBUMIN in the last 168 hours. No results for input(s): LIPASE, AMYLASE in the last 168 hours. No results for input(s): AMMONIA in the last 168 hours. Coagulation Profile: No results for input(s): INR, PROTIME in the last 168 hours. Cardiac Enzymes: No results for input(s): CKTOTAL, CKMB, CKMBINDEX, TROPONINI in the last 168 hours. BNP (last 3 results) No results for input(s): PROBNP in the last 8760 hours. HbA1C: No results for input(s): HGBA1C in the last 72 hours. CBG: Recent Labs  Lab 11/09/20 1748 11/09/20 2008 11/10/20 0008 11/10/20 0445 11/10/20 0828  GLUCAP 129* 122* 132* 143* 116*   Lipid Profile: No results for input(s): CHOL, HDL, LDLCALC, TRIG, CHOLHDL, LDLDIRECT in the last 72 hours. Thyroid Function Tests: No results for input(s): TSH, T4TOTAL, FREET4, T3FREE, THYROIDAB in the last 72 hours. Anemia Panel: No results for input(s): VITAMINB12, FOLATE, FERRITIN, TIBC, IRON, RETICCTPCT in the last 72 hours. Sepsis Labs: No results for input(s): PROCALCITON, LATICACIDVEN in the last 168 hours.  No results found for this or any previous  visit (from the past 240 hour(s)).       Radiology Studies: No results found.      Scheduled Meds:  bacitracin   Topical Daily   chlorhexidine gluconate (MEDLINE KIT)  15 mL Mouth Rinse BID   Chlorhexidine Gluconate Cloth  6 each Topical Daily   docusate  100 mg Per Tube BID   heparin  5,000 Units Subcutaneous Q8H   influenza vac split quadrivalent PF  0.5 mL Intramuscular Tomorrow-1000   insulin aspart  0-15 Units Subcutaneous Q4H   lamoTRIgine  100 mg Per Tube Daily   levothyroxine  75 mcg Per Tube Q0600   pantoprazole sodium  40 mg Per Tube BID   polyethylene glycol  17 g Per Tube BID   QUEtiapine  400 mg Per Tube BID   rosuvastatin  20 mg Per Tube Daily  sodium chloride flush  10-40 mL Intracatheter Q12H   valproic acid  250 mg Per Tube BID   Continuous Infusions:  sodium chloride 10 mL/hr at 11/02/20 0600   feeding supplement (OSMOLITE 1.2 CAL) 75 mL/hr at 11/10/20 0905     LOS: 20 days    Time spent: 38 minutes spent on chart review, discussion with nursing staff, consultants, updating family and interview/physical exam; more than 50% of that time was spent in counseling and/or coordination of care.    Rosemary Pentecost J [place] DO [facility] Available via Epic secure chat 7am-7pm After these hours, please refer to coverage provider listed on [email or web address] 11/10/2020, 10:02 AM

## 2020-11-10 NOTE — Plan of Care (Signed)
  Problem: Health Behavior/Discharge Planning: Goal: Ability to manage health-related needs will improve Outcome: Progressing   Problem: Nutrition: Goal: Adequate nutrition will be maintained Outcome: Progressing   Problem: Coping: Goal: Level of anxiety will decrease Outcome: Progressing   Problem: Elimination: Goal: Will not experience complications related to bowel motility Outcome: Progressing Goal: Will not experience complications related to urinary retention Outcome: Progressing   Problem: Pain Managment: Goal: General experience of comfort will improve Outcome: Progressing   Problem: Safety: Goal: Ability to remain free from injury will improve Outcome: Progressing   

## 2020-11-11 DIAGNOSIS — K449 Diaphragmatic hernia without obstruction or gangrene: Secondary | ICD-10-CM | POA: Diagnosis not present

## 2020-11-11 DIAGNOSIS — R55 Syncope and collapse: Secondary | ICD-10-CM | POA: Diagnosis not present

## 2020-11-11 DIAGNOSIS — J9601 Acute respiratory failure with hypoxia: Secondary | ICD-10-CM | POA: Diagnosis not present

## 2020-11-11 DIAGNOSIS — E039 Hypothyroidism, unspecified: Secondary | ICD-10-CM | POA: Diagnosis not present

## 2020-11-11 LAB — BASIC METABOLIC PANEL
Anion gap: 7 (ref 5–15)
BUN: 11 mg/dL (ref 8–23)
CO2: 27 mmol/L (ref 22–32)
Calcium: 9.2 mg/dL (ref 8.9–10.3)
Chloride: 102 mmol/L (ref 98–111)
Creatinine, Ser: 0.92 mg/dL (ref 0.61–1.24)
GFR, Estimated: >60 mL/min (ref >60)
Glucose, Bld: 156 mg/dL — ABNORMAL HIGH (ref 70–99)
Potassium: 4.2 mmol/L (ref 3.5–5.1)
Sodium: 136 mmol/L (ref 135–145)

## 2020-11-11 LAB — CBC
HCT: 33.7 % — ABNORMAL LOW (ref 39.0–52.0)
Hemoglobin: 10.1 g/dL — ABNORMAL LOW (ref 13.0–17.0)
MCH: 26 pg (ref 26.0–34.0)
MCHC: 30 g/dL (ref 30.0–36.0)
MCV: 86.9 fL (ref 80.0–100.0)
Platelets: 347 10*3/uL (ref 150–400)
RBC: 3.88 MIL/uL — ABNORMAL LOW (ref 4.22–5.81)
RDW: 19.6 % — ABNORMAL HIGH (ref 11.5–15.5)
WBC: 6.3 10*3/uL (ref 4.0–10.5)
nRBC: 0 % (ref 0.0–0.2)

## 2020-11-11 LAB — GLUCOSE, CAPILLARY
Glucose-Capillary: 106 mg/dL — ABNORMAL HIGH (ref 70–99)
Glucose-Capillary: 110 mg/dL — ABNORMAL HIGH (ref 70–99)
Glucose-Capillary: 133 mg/dL — ABNORMAL HIGH (ref 70–99)
Glucose-Capillary: 145 mg/dL — ABNORMAL HIGH (ref 70–99)
Glucose-Capillary: 155 mg/dL — ABNORMAL HIGH (ref 70–99)
Glucose-Capillary: 165 mg/dL — ABNORMAL HIGH (ref 70–99)
Glucose-Capillary: 178 mg/dL — ABNORMAL HIGH (ref 70–99)

## 2020-11-11 NOTE — Progress Notes (Signed)
Physical Therapy Treatment Patient Details Name: [name] MRN: [id number] DOB: April 15, 1956 Today's Date: 11/11/2020    History of Present Illness 64 y.o male presented 10/21/20 with syncope and fall at his group home. Workup showed large hiatal hernia compressing trachea. +aspiration pnemonia; intubated 10/30; 11/03 hiatal hernia repair with PEG placement; extubated 11/12; encephalopathy; scrotal edema  PMH includes GERD, Barrett esophagus, gastroparesis, HTN, stroke with L sided deficits, craniotomy for brain cyst, schizophrenia, intellectual disability.    PT Comments    Pt supine on arrival, agreeable to therapy session once awoken and with good participation and tolerance for mobility. Pt minA for bed mobility with increased time to perform. Of note, pt bed saturated and condom cath not on upon staff arrival to room, RN notified and pt assisted to [facility] with +53minA for stand pivoting using RW. Pt cleaned up sitting on BSC, +52minA for static standing at RW for peri-care (totalA) and performed gait trial using RW with +87minA to chair, reports 5/10 modified RPE (fatigue) at end of session. Pt performed seated BLE AAROM therapeutic exercises as detailed below with good tolerance, needs multimodal cues due to LLE weakness and cognitive deficit. Pt continues to benefit from PT services to progress toward functional mobility goals. D/C recs below remain appropriate, pt with improved alertness and activity tolerance this session compared with previous session.  Follow Up Recommendations  CIR;Supervision/Assistance - 24 hour     Equipment Recommendations  Other (comment) (TBD next venue)    Recommendations for Other Services       Precautions / Restrictions Precautions Precautions: Fall Precaution Comments: Lt hemiparesis, PEG Restrictions Weight Bearing Restrictions: No    Mobility  Bed Mobility Overal bed mobility: Needs Assistance Bed Mobility: Supine to Sit     Supine to  sit: Min assist;HOB elevated     General bed mobility comments: minA for anterior seated scoot with bed pad assist/bed mobility, once pt began to transfer bed noted to be saturated with urine and condom cath not attached, RN notified and pt bed linens cleared off/bed wiped down  Transfers Overall transfer level: Needs assistance Equipment used: Rolling walker (2 wheeled) Transfers: Sit to/from [employer] Sit to Stand: Min assist;+2 physical assistance;+2 safety/equipment Stand pivot transfers: Min assist;+2 physical assistance;+2 safety/equipment       General transfer comment: from EOB<>BSC and BSC>chair, pt needing +23minA for STS and able to stand with +87minA for pericare after toileting, pt total A for pericare; pt needing verbal/tactile cues and assist to manage RW during turns and max cues for safety prior to sitting   Ambulation/Gait Ambulation/Gait assistance: Min assist;+2 physical assistance;+2 safety/equipment Gait Distance (Feet): 6 Feet Assistive device: Rolling walker (2 wheeled) Gait Pattern/deviations: Step-to pattern;Decreased step length - left;Decreased weight shift to left;Shuffle Gait velocity: decreased Gait velocity interpretation:  (grossly <0.2 m/s) General Gait Details: BSC>chair using RW, pt needs heavy vcs and some tactile cues for LLE sequencing and assist to manage RW   Stairs             Wheelchair Mobility    Modified Rankin (Stroke Patients Only)       Balance Overall balance assessment: Needs assistance Sitting-balance support: Feet supported;No upper extremity supported Sitting balance-Leahy Scale: Fair Sitting balance - Comments: Supervision for static sitting   Standing balance support: Bilateral upper extremity supported;During functional activity Standing balance-Leahy Scale: Poor Standing balance comment: UE and external support  Cognition Arousal/Alertness:  Awake/alert Behavior During Therapy: Flat affect Overall Cognitive Status: History of cognitive impairments - at baseline                                 General Comments: pt impulsive at times, but good following of 1-step commands      Exercises General Exercises - Lower Extremity Ankle Circles/Pumps: AROM;AAROM;Both;10 reps;Supine (AA for LLE, AROM for RLE) Long Arc Quad: AAROM;Both;Seated;20 reps (needs tactile cues for technique/sequencing 2/2 cog delay) Hip Flexion/Marching: AAROM;Both;10 reps;Seated (tcs for sequencing and increased assist for LLE completion)    General Comments General comments (skin integrity, edema, etc.): pt incontinent of urine (condom cath had come off) and RN notified will need to be replaced; gown changed, pt up in chair with briefs on at end of session, chair alarm on and tube feeds running      Pertinent Vitals/Pain Pain Assessment: No/denies pain Faces Pain Scale: No hurt Pain Intervention(s): Monitored during session;Repositioned   Vitals:   11/11/20 0300 11/11/20 0725  BP: 131/77 129/72  Pulse: 91 75  Resp:  17  Temp: 98.2 F (36.8 C) 98.1 F (36.7 C)  SpO2: 98% 98%  SpO2 WNL on RA during mobility, 93-98% and HR in 90's bpm during mobility.  Home Living                      Prior Function            PT Goals (current goals can now be found in the care plan section) Acute Rehab PT Goals Patient Stated Goal: to get back to normal per sister  PT Goal Formulation: Patient unable to participate in goal setting Time For Goal Achievement: 11/20/20 Potential to Achieve Goals: Fair Progress towards PT goals: Progressing toward goals    Frequency    Min 3X/week      PT Plan Current plan remains appropriate    Co-evaluation PT/OT/SLP Co-Evaluation/Treatment: Yes Reason for Co-Treatment: Complexity of the patient's impairments (multi-system involvement);Necessary to address cognition/behavior during functional  activity;To address functional/ADL transfers PT goals addressed during session: Mobility/safety with mobility;Balance;Proper use of DME;Strengthening/ROM        AM-PAC PT "6 Clicks" Mobility   Outcome Measure  Help needed turning from your back to your side while in a flat bed without using bedrails?: A Little Help needed moving from lying on your back to sitting on the side of a flat bed without using bedrails?: A Little Help needed moving to and from a bed to a chair (including a wheelchair)?: A Lot Help needed standing up from a chair using your arms (e.g., wheelchair or bedside chair)?: A Little Help needed to walk in hospital room?: A Lot Help needed climbing 3-5 steps with a railing? : Total 6 Click Score: 14    End of Session Equipment Utilized During Treatment: Gait belt Activity Tolerance: Patient tolerated treatment well Patient left: with call bell/phone within reach;in chair;with chair alarm set (RN notified pt briefed needs new condom cath placed) Nurse Communication: Mobility status PT Visit Diagnosis: Other abnormalities of gait and mobility (R26.89);Muscle weakness (generalized) (M62.81)     Time: 2229-7989 PT Time Calculation (min) (ACUTE ONLY): 26 min  Charges:  $Therapeutic Activity: 8-22 mins                     Donovyn Guidice P., PTA Acute Rehabilitation Services Pager: [phone] Office: [phone]  Louellen Haldeman M Sharell Hilmer 11/11/2020, 12:30 PM

## 2020-11-11 NOTE — Progress Notes (Signed)
PROGRESS NOTE    [name]  DDU:[id number] DOB: 1956-05-27 DOA: 10/20/2020  PCP: [place]    Brief Narrative:  [name] is a 64 year old male with past medical history significant for ischemic CVA with left-sided deficits/dysphagia, Barrett's esophagus, GERD, gastroparesis, essential hypertension, hyperlipidemia, iron deficiency anemia, intellectual deficiency, schizophrenia, seizure disorder, hypothyroidism who presented to the ED via EMS from his group home for evaluation of fall and syncopal episode.  Per staff at group home, patient's fall unwitnessed and when trying to assist him to his feet he had a syncopal episode.  He was noted to be pale diaphoretic by EMS with tachycardia with HR 120-130.  SPO2 was noted to be 88% on room air.  CBG 255.  In the ED, afebrile with persistent tachycardia and tachypnea.  Requiring 3 L of supplemental oxygen.  Sodium 136, potassium 4.5, chloride 104, bicarb 19, anion gap 13, BUN 37, creatinine 1.7 with baseline 1.2, glucose 163, SARS-CoV-2 PCR negative.  Influenzinum panel negative.  D-dimer 0.62.  Procalcitonin 0.11, lactic acid 1.5.  Chest x-ray with no acute infiltrate but does note marked severely gastric and esophageal distention.  CT angiogram chest negative for PE.  CT abdomen/pelvis with large, stable gastric hernia with associated esophageal dilation and subsequent mass-effect on the trachea.  EDP discussed case with cardiothoracic surgery, Dr. Kipp Brood will consult.  Admitted to hospitalist service.  Patient was initially admitted to the hospital service on 10/21/2020, patient underwent EGD on 10/21/2020.  Early morning on 10/22/2020, rapid response as patient developed significant respiratory distress and was subsequently intubated and transferred to ICU under the critical care service.  Patient underwent hiatal hernia repair with G-tube placement by cardiothoracic surgery, Dr. Kipp Brood on 10/26/2020.   Patient was extubated on 11/05/2020.  Transferred back to hospital service on 11/06/2020.   Assessment & Plan: Principal Problem:   Syncope Active Problems:   Hypothyroidism   Hiatal hernia   Acute hypoxemic respiratory failure (HCC)   Sinus tachycardia   Gastric polyps   S/P repair of paraesophageal hernia   Bowel obstruction (HCC) Acute hypoxic/hypercapnic respiratory failure, present on admission Aspiration vs ventilator associated pneumonia; Klebsiella PNA and Citrobacter Koseri Sepsis, not present on admission Patient presenting to ED from group home with syncopal episode and was noted to be in respiratory distress.  CT angiogram chest negative for PE and no evidence of acute infiltrate, pleural effusion or pneumothorax.  TTE with LVEF 23-76%, grade 1 diastolic dysfunction, no aortic stenosis, normal IVC.  Etiology likely secondary to his large hiatal hernia with compressive tracheal compression.  Rapid response 10/22/2020 with subsequent intubation for increased work of breathing.  During the initial hospitalization, patient was noted to develop pneumonia aspiration versus ventilator associated pneumonia and was treated with course of Unasyn followed by Bactrim in accordance with tracheal aspirate culture susceptibilities. --Currently oxygenating well on room air, occasionally will desaturate while sleeping; continue to monitor SPO2  Large hiatal hernia Barrett's esophagus GERD Gastroparesis CT abdomen/pelvis with large stable gastric hernia with associated esophageal dilation and subsequent mass-effect on the trachea, mild right upper lobe atelectasis.  Cardiothoracic surgery was consulted.  Patient underwent robotic assisted hiatal hernia repair by Dr. Kipp Brood on 10/26/2020 with PEG tube placement and IR placement of jejunal arm vent tube.  Postoperatively, course complicated by gastroparesis.  Initially requiring neostigmine and Reglan, now discontinued.  Tolerating tube feeds at  goal. --Protonix 40 mg BID --Colace 159m BID --MiraLAX BID --zofran prn --Speech therapy consulted for reevaluation  given patient now extubated and bowel function seems to be improved and at goal with tube feeds. Pt is alert and awake and oriented to person place and president. TF continued.    Hypothyroidism --Levothyroxine 75 mcg daily --no change .   Schizophrenia Intellectual delay Seizure disorder --Valproic acid 250 mg BID --Lamictal 100 mg daily --stable cont Lamictal and valproate.   Hyperlipidemia: Crestor 20 mg daily Cont Crestor no change.   Essential hypertension On benazepril 20 mg p.o. daily and propranolol 10 mg p.o. daily at home. --BP 110/66 this morning --Continue to hold home antihypertensives --Monitor BP closely daily. Blood pressure 128/69, pulse 75, temperature 98 F (36.7 C), temperature source Axillary, resp. rate 17, height _0  (1.626 m), weight 70.1 kg, SpO2 99 %. cont benazepril and propranolol.    History of CVA with left-sided deficits and dysphagia CT head without contrast with no acute intracranial abnormality but does note large area encephalomalacia involving right frontal lobe. --Continue tube feeds --PT/OT  Weakness, debility, deconditioning: --continue PT/OT efforts --Likely needs SNF vs LTACH vs CIR --TOC for placement    DVT prophylaxis: Heparin Code Status: Full code Family Communication: No family present at bedside this morning; attempted to contact patients sister (POA) [name] via telephone this am, unsuccessful  Disposition Plan:  Status is: Inpatient  Remains inpatient appropriate because:Unsafe d/c plan, IV treatments appropriate due to intensity of illness or inability to take PO and Inpatient level of care appropriate due to severity of illness   Dispo: The patient is from: Group home  Anticipated d/c is to: LTAC vs CIR vs SNF  Anticipated d/c date is: 2 days   Patient currently is not medically stable to d/c.  Need to ensure regular bowel movements and titration off of scheduled Reglan and neostigmine   Consultants:   PCCM - signed off 11/14  Cardiothoracic surgery  [facility]  Procedures:   Intubation 10/30; extubated 11/13  EGD, Dr. Carlean Purl 10/30  Left [place] CVL 10/30 - 11/13  Robotic assisted hiatal hernia repair 11/3, Dr. Kipp Brood  PEG 11/3, jejunal arm vent tube placement IR 11/15   Subjective/Overview: Pt is alert and oriented and denies any complaints of chest pain head or eye or neck or nose  throat or chest or abdomen.Pt is ready for discharge once placement is available.  Lasbs are stable and hb is stable.   Objective: Vitals:   11/11/20 0300 11/11/20 0413 11/11/20 0725 11/11/20 1402  BP: 131/77  129/72 128/69  Pulse: 91  75 75  Resp:   17 17  Temp: 98.2 F (36.8 C)  98.1 F (36.7 C) 98 F (36.7 C)  TempSrc: Axillary  Axillary Axillary  SpO2: 98%  98% 99%  Weight:  70.1 kg    Height:       SpO2: 99 % O2 Flow Rate (L/min): 4 L/min FiO2 (%): (S) 30 %  Intake/Output Summary (Last 24 hours) at 11/11/2020 1924 Last data filed at 11/11/2020 1812 Gross per 24 hour  Intake 100 ml  Output 1200 ml  Net -1100 ml   Filed Weights   11/09/20 0400 11/10/20 0500 11/11/20 0413  Weight: 74.4 kg 70.3 kg 70.1 kg    Examination: Blood pressure 128/69, pulse 75, temperature 98 F (36.7 C), temperature source Axillary, resp. rate 17, height _1  (1.626 m), weight 70.1 kg, SpO2 99 %. General exam: Appears calm and comfortable  HEENT:EOMI, perrl  Respiratory system: Clear to auscultation. Respiratory effort normal. Cardiovascular system: S1 & S2 heard,  RRR. No JVD, murmurs, rubs, gallops or clicks. No pedal edema. Gastrointestinal system: Abdomen is nondistended, soft and nontender. No organomegaly or masses felt. Normal bowel sounds heard. Central nervous system: Alert and oriented.CN grossly intact No focal  neurological deficits. Extremities: pt moving all 4 ext / no edema Skin: No rashes, lesions or ulcers Psychiatry: Judgement and insight appear normal. Mood & affect appropriate.     Data Reviewed: I have personally reviewed following labs and imaging studies  Labs  No results for input(s): CKTOTAL, CKMB, TROPONINI in the last 72 hours. Lab Results  Component Value Date   WBC 6.3 11/11/2020   HGB 10.1 (L) 11/11/2020   HCT 33.7 (L) 11/11/2020   MCV 86.9 11/11/2020   PLT 347 11/11/2020    Recent Labs  Lab 11/11/20 0155  NA 136  K 4.2  CL 102  CO2 27  BUN 11  CREATININE 0.92  CALCIUM 9.2  GLUCOSE 156*   Lab Results  Component Value Date   TRIG 173 (H) 10/31/2020   Lab Results  Component Value Date   DDIMER 0.62 (H) 10/20/2020   Invalid input(s): POCBNP  echocardiogram   Radiology Studies: No results found.    Current Facility-Administered Medications (Endocrine & Metabolic):  .  insulin aspart (novoLOG) injection 0-15 Units .  levothyroxine (SYNTHROID) tablet 75 mcg   Current Facility-Administered Medications (Cardiovascular):  .  rosuvastatin (CRESTOR) tablet 20 mg   Current Facility-Administered Medications (Respiratory):  .  albuterol (PROVENTIL) (2.5 MG/3ML) 0.083% nebulizer solution 2.5 mg   Current Facility-Administered Medications (Analgesics):  .  acetaminophen (TYLENOL) suppository 650 mg .  oxyCODONE (ROXICODONE) 5 MG/5ML solution 7.5 mg   Current Facility-Administered Medications (Hematological):  .  heparin injection 5,000 Units   Current Facility-Administered Medications (Other):  .  0.9 %  sodium chloride infusion .  bacitracin ointment .  chlorhexidine gluconate (MEDLINE KIT) (PERIDEX) 0.12 % solution 15 mL .  Chlorhexidine Gluconate Cloth 2 % PADS 6 each .  docusate (COLACE) 50 MG/5ML liquid 100 mg .  feeding supplement (OSMOLITE 1.2 CAL) liquid 1,000 mL .  free water 74 mL .  influenza vac split quadrivalent PF (FLUARIX)  injection 0.5 mL .  lamoTRIgine (LAMICTAL) tablet 100 mg .  lip balm (CARMEX) ointment .  LORazepam (ATIVAN) injection 1 mg .  ondansetron (ZOFRAN) injection 4 mg .  pantoprazole sodium (PROTONIX) 40 mg/20 mL oral suspension 40 mg .  polyethylene glycol (MIRALAX / GLYCOLAX) packet 17 g .  QUEtiapine (SEROQUEL) tablet 400 mg .  sodium chloride flush (NS) 0.9 % injection 10-40 mL .  sodium chloride flush (NS) 0.9 % injection 10-40 mL .  valproic acid (DEPAKENE) 250 MG/5ML solution 250 mg  No current outpatient medications on file.  Anti-infectives (From admission, onward)   Start     Dose/Rate Route Frequency Ordered Stop   11/02/20 2200  sulfamethoxazole-trimethoprim (BACTRIM) 200-40 MG/5ML suspension 20 mL        20 mL Per Tube Every 12 hours 11/02/20 1534 11/07/20 0957   11/02/20 1115  sulfamethoxazole-trimethoprim (BACTRIM) 200-40 MG/5ML suspension 20 mL  Status:  Discontinued        20 mL Oral Every 12 hours 11/02/20 1020 11/02/20 1534   10/21/20 0500  Ampicillin-Sulbactam (UNASYN) 3 g in sodium chloride 0.9 % 100 mL IVPB  Status:  Discontinued        3 g 200 mL/hr over 30 Minutes Intravenous Every 6 hours 10/21/20 0430 10/24/20 0901       Continuous  Infusions: . sodium chloride 10 mL/hr at 11/02/20 0600  . feeding supplement (OSMOLITE 1.2 CAL) 1,000 mL (11/10/20 1706)     LOS: 21 days    Para Skeans, MD [facility] Pager [phone] How to contact the [facility] Attending or Consulting provider [place] or covering provider during after hours [place], for this patient?    1. Check the care team in [facility] and look for a) attending/consulting TR[facility] provider listed and b) the [facility] team listed 2. Log into [email or web address] and use [place]'s universal password to access. If you do not have the password, please contact the hospital operator. 3. Locate the [facility] provider you are looking for under [facility] and page to a number that you can be directly reached. 4. If you still  have difficulty reaching the provider, please page the [facility] (Director on Call) for the Hospitalists listed on amion for assistance. [email or web address] Password [facility] 11/11/2020, 7:24 PM

## 2020-11-11 NOTE — Plan of Care (Signed)

## 2020-11-11 NOTE — Progress Notes (Signed)
Occupational Therapy Treatment Patient Details Name: [name] MRN: [id number] DOB: February 05, 1956 Today's Date: 11/11/2020    History of present illness 64 y.o male presented 10/21/20 with syncope and fall at his group home. Workup showed large hiatal hernia compressing trachea. +aspiration pnemonia; intubated 10/30; 11/03 hiatal hernia repair with PEG placement; extubated 11/12; encephalopathy; scrotal edema  PMH includes GERD, Barrett esophagus, gastroparesis, HTN, stroke with L sided deficits, craniotomy for brain cyst, schizophrenia, intellectual disability.   OT comments  Pt progressing towards OT goals, agreeable to participate. Once mobilizing to EOB, noted with soiled linen and pt assisted to [facility] with Min A x 2. Pt noted to be impulsive, requiring cues for safety due to L hemiparesis during mobility. Pt overall Mod A for UB ADLs today and Total A for LB ADLs today, including toileting due to incontinence. Continue to recommend CIR to assist in returning pt to PLOF.    Follow Up Recommendations  CIR;Supervision/Assistance - 24 hour    Equipment Recommendations  None recommended by OT    Recommendations for Other Services Rehab consult    Precautions / Restrictions Precautions Precautions: Fall Precaution Comments: Lt hemiparesis, PEG Restrictions Weight Bearing Restrictions: No       Mobility Bed Mobility Overal bed mobility: Needs Assistance Bed Mobility: Supine to Sit     Supine to sit: Min assist;HOB elevated     General bed mobility comments: minA for anterior seated scoot with bed pad assist/bed mobility, once pt began to transfer bed noted to be saturated with urine and condom cath not attached, RN notified and pt bed linens cleared off/bed wiped down  Transfers Overall transfer level: Needs assistance Equipment used: Rolling walker (2 wheeled) Transfers: Sit to/from [employer] Sit to Stand: Min assist;+2 physical assistance;+2  safety/equipment Stand pivot transfers: Min assist;+2 physical assistance;+2 safety/equipment       General transfer comment: from EOB<>BSC and BSC>chair, pt needing +20minA for STS and able to stand with +31minA for pericare after toileting, pt total A for pericare; pt needing verbal/tactile cues and assist to manage RW during turns and max cues for safety prior to sitting     Balance Overall balance assessment: Needs assistance Sitting-balance support: Feet supported;No upper extremity supported Sitting balance-Leahy Scale: Fair Sitting balance - Comments: Supervision for static sitting   Standing balance support: Bilateral upper extremity supported;During functional activity Standing balance-Leahy Scale: Poor Standing balance comment: UE and external support                           ADL either performed or assessed with clinical judgement   ADL Overall ADL's : Needs assistance/impaired                 Upper Body Dressing : Moderate assistance;Sitting Upper Body Dressing Details (indicate cue type and reason): Mod A to don clean hospital gown Lower Body Dressing: Total assistance;Sit to/from stand Lower Body Dressing Details (indicate cue type and reason): Total A to don socks and brief Toilet Transfer: Minimal assistance;+2 for physical assistance;+2 for safety/equipment;Stand-pivot;BSC;RW Toilet Transfer Details (indicate cue type and reason): Min A x 2 for safety in [facility] transfer using RW due to L hemiparesis and quick pace Toileting- Clothing Manipulation and Hygiene: Total assistance;Sit to/from stand Toileting - Clothing Manipulation Details (indicate cue type and reason): Total A for hygiene after BM       General ADL Comments: pt limited by cognition, safety awareness, weakness, L hemi  Vision   Vision Assessment?: No apparent visual deficits   Perception     Praxis      Cognition Arousal/Alertness: Awake/alert Behavior During Therapy: Flat  affect Overall Cognitive Status: History of cognitive impairments - at baseline                                 General Comments: Hx of intellectual disability. pt impulsive at times, but good following of 1-step commands        Exercises Exercises: General Lower Extremity General Exercises - Lower Extremity Ankle Circles/Pumps: AROM;AAROM;Both;10 reps;Supine (AA for LLE, AROM for RLE) Long Arc Quad: AAROM;Both;Seated;20 reps (needs tactile cues for technique/sequencing 2/2 cog delay) Hip Flexion/Marching: AAROM;Both;10 reps;Seated (tcs for sequencing and increased assist for LLE completion)   Shoulder Instructions       General Comments Pt incontinent of urine with condom cath off, RN/NT notified. VSS on RA    Pertinent Vitals/ Pain       Pain Assessment: No/denies pain Faces Pain Scale: No hurt Pain Intervention(s): Monitored during session  Home Living                                          Prior Functioning/Environment              Frequency  Min 2X/week        Progress Toward Goals  OT Goals(current goals can now be found in the care plan section)  Progress towards OT goals: Progressing toward goals  Acute Rehab OT Goals Patient Stated Goal: to get back to normal per sister  OT Goal Formulation: With patient/family Time For Goal Achievement: 11/20/20 Potential to Achieve Goals: Good ADL Goals Pt Will Perform Grooming: with min assist;standing Pt Will Perform Upper Body Bathing: with mod assist;sitting Pt Will Perform Lower Body Bathing: with mod assist;sit to/from stand Pt Will Perform Upper Body Dressing: with min assist;sitting Pt Will Perform Lower Body Dressing: with mod assist;sit to/from stand Pt Will Transfer to Toilet: with mod assist;ambulating;regular height toilet;bedside commode;grab bars Pt Will Perform Toileting - Clothing Manipulation and hygiene: with mod assist;sit to/from stand Additional ADL Goal #1:  Pt will consistently [employer] UE as a gross assist during ADLs  Plan Discharge plan remains appropriate;Frequency remains appropriate    Co-evaluation    PT/OT/SLP Co-Evaluation/Treatment: Yes Reason for Co-Treatment: Complexity of the patient's impairments (multi-system involvement);Necessary to address cognition/behavior during functional activity;To address functional/ADL transfers PT goals addressed during session: Mobility/safety with mobility;Balance;Proper use of DME;Strengthening/ROM OT goals addressed during session: ADL's and self-care      AM-PAC OT "6 Clicks" Daily Activity     Outcome Measure   Help from another person eating meals?: Total Help from another person taking care of personal grooming?: A Little Help from another person toileting, which includes using toliet, bedpan, or urinal?: Total Help from another person bathing (including washing, rinsing, drying)?: A Lot Help from another person to put on and taking off regular upper body clothing?: A Lot Help from another person to put on and taking off regular lower body clothing?: Total 6 Click Score: 10    End of Session Equipment Utilized During Treatment: Gait belt;Rolling walker  OT Visit Diagnosis: Unsteadiness on feet (R26.81);Cognitive communication deficit (R41.841);Hemiplegia and hemiparesis Symptoms and signs involving cognitive functions: Cerebral infarction Hemiplegia - Right/Left: Left  Hemiplegia - dominant/non-dominant: Non-Dominant   Activity Tolerance Patient tolerated treatment well   Patient Left in chair;with call bell/phone within reach;with chair alarm set   Nurse Communication Mobility status;Other (comment) (condom cath)        Time: 7841-2820 OT Time Calculation (min): 29 min  Charges: OT General Charges $OT Visit: 1 Visit OT Treatments $Self Care/Home Management : 8-22 mins  Lorre Munroe, OTR/L   Lorre Munroe 11/11/2020, 1:00 PM

## 2020-11-11 NOTE — Progress Notes (Addendum)
   11/11/20 1000  SLP Visit Information  SLP Received On 11/11/20  General Information  Behavior/Cognition Alert;Cooperative  Patient Positioning Upright in bed  Oral care provided N/A  HPI Pt is a 64 yo male presenting from his group home for evaluation of fall and syncopal episode. CXR was clear of infiltrates but CT abdomen/pelvis with large, stable gastric hernia with associated esophageal dilation and subsequent mass-effect on the trachea. Pt developed respiratory distress 10/30 requiring intubation. He is s/p [facility] repair with g-tube placement on 11/3; post-op course complicated by gastroparesis. Extubated 11/13. PMH also includes: Barrett's esophagus, GERD, HTN, HOLD, schizophrenia, intellectual delay, CVA with L-sided deficits and dysphagia (MBS in 2008 revealed a mild oropharyngeal dysphagia with recommendations for Dys 3 diet and thin liquids)  Temperature Spikes Noted No  Respiratory Status Room air  Oral Cavity - Dentition Edentulous  Patient observed directly with PO's Yes  Type of PO's observed Thin liquids;Dysphagia 1 (puree)  Feeding Needs assist  Liquids provided via Straw  Treatment Provided  Treatment provided Dysphagia  Dysphagia Treatment  Treatment Methods Skilled observation;Upgraded PO texture trial  Type of cueing Verbal  Amount of cueing Minimal  Other treatment/comments Pt alert, answers questions with yes/no but reliability is questionable.  Participated in trials of water and applesauce, demonstrating the appearance of a delay in swallow initiation and multiple sub-swallows per bolus, suggesting either pharyngeal residue or difficulty with esophageal transit.  Considering pt's medical course and surgical intervention, it may be beneficial  to obtain esophagram or further testing prior to resuming an oral diet.  Will defer that decision to medical team and follow their directive. Continue NPO and J-tube feeds in the meantime.  Secure-chatted with Dr. Allena Katz regarding the  above.  SLP will follow.   Pain Assessment  Pain Assessment Faces  Faces Pain Scale 0  SLP - End of Session  Patient left in bed;with call bell/phone within reach;with bed alarm set  Nurse Communication Treatment plan  Assessment / Recommendations / Plan  Plan Continue with current plan of care  Dysphagia Recommendations  Diet recommendations NPO  Medication Administration Via alternative means  General Recommendations  Oral Care Recommendations Oral care QID  Follow up Recommendations Other (comment) (tba)  SLP Visit Diagnosis Dysphagia, unspecified (R13.10)  Progression Toward Goals  Patient/Family Stated Goal none stated  SLP Time Calculation  SLP Start Time (ACUTE ONLY) 1047  SLP Stop Time (ACUTE ONLY) 1100  SLP Time Calculation (min) (ACUTE ONLY) 13 min  SLP Evaluations  $ SLP Speech Visit 1 Visit  SLP Evaluations  $Swallowing Treatment 1 Procedure

## 2020-11-11 NOTE — Progress Notes (Addendum)
Inpatient Rehabilitation-Admissions Coordinator    Ongoing discussions with family and his group home about assistance available at DC. TOC team aware  Cheri Rous, OTR/L  Rehab Admissions Coordinator  [phone] 11/11/2020 2:19 PM

## 2020-11-12 ENCOUNTER — Inpatient Hospital Stay (HOSPITAL_COMMUNITY): Payer: Medicare Other

## 2020-11-12 DIAGNOSIS — J9601 Acute respiratory failure with hypoxia: Secondary | ICD-10-CM | POA: Diagnosis not present

## 2020-11-12 DIAGNOSIS — E039 Hypothyroidism, unspecified: Secondary | ICD-10-CM | POA: Diagnosis not present

## 2020-11-12 DIAGNOSIS — K449 Diaphragmatic hernia without obstruction or gangrene: Secondary | ICD-10-CM | POA: Diagnosis not present

## 2020-11-12 LAB — BASIC METABOLIC PANEL
Anion gap: 10 (ref 5–15)
BUN: 12 mg/dL (ref 8–23)
CO2: 24 mmol/L (ref 22–32)
Calcium: 9 mg/dL (ref 8.9–10.3)
Chloride: 101 mmol/L (ref 98–111)
Creatinine, Ser: 0.83 mg/dL (ref 0.61–1.24)
GFR, Estimated: >60 mL/min (ref >60)
Glucose, Bld: 212 mg/dL — ABNORMAL HIGH (ref 70–99)
Potassium: 4 mmol/L (ref 3.5–5.1)
Sodium: 135 mmol/L (ref 135–145)

## 2020-11-12 LAB — GLUCOSE, CAPILLARY
Glucose-Capillary: 140 mg/dL — ABNORMAL HIGH (ref 70–99)
Glucose-Capillary: 160 mg/dL — ABNORMAL HIGH (ref 70–99)
Glucose-Capillary: 161 mg/dL — ABNORMAL HIGH (ref 70–99)
Glucose-Capillary: 175 mg/dL — ABNORMAL HIGH (ref 70–99)
Glucose-Capillary: 185 mg/dL — ABNORMAL HIGH (ref 70–99)
Glucose-Capillary: 191 mg/dL — ABNORMAL HIGH (ref 70–99)

## 2020-11-12 LAB — CBC
HCT: 33.2 % — ABNORMAL LOW (ref 39.0–52.0)
Hemoglobin: 10.3 g/dL — ABNORMAL LOW (ref 13.0–17.0)
MCH: 27.1 pg (ref 26.0–34.0)
MCHC: 31 g/dL (ref 30.0–36.0)
MCV: 87.4 fL (ref 80.0–100.0)
Platelets: 333 10*3/uL (ref 150–400)
RBC: 3.8 MIL/uL — ABNORMAL LOW (ref 4.22–5.81)
RDW: 19.7 % — ABNORMAL HIGH (ref 11.5–15.5)
WBC: 5.9 10*3/uL (ref 4.0–10.5)
nRBC: 0 % (ref 0.0–0.2)

## 2020-11-12 NOTE — Progress Notes (Signed)
Upon midline dressing change, the catheter was noted to be kinked, bent, and nonflushable. Line removed and PIV started.

## 2020-11-12 NOTE — Progress Notes (Cosign Needed)
     RE:  [name]       Date of Birth:  [birth year]     Date:   11/12/20       To Whom It May Concern:  Please be advised that the above-named patient will require a short-term nursing home stay - anticipated 30 days or less for rehabilitation and strengthening.  The plan is for return home.                 MD signature                Date

## 2020-11-12 NOTE — Plan of Care (Addendum)
RN spoke to Pt's sister, [name]. RN was informed that Pt's clothes, shoes and dentures are missing. Called [facility] to check if belongings were left there but they don't have it there. Per Pt's sister, security had found Pt's dentures. RN called security but was asked to callback on Monday 11/22 to follow up again. Security stated that they don't have Pt's dentures in their storage. Pt's sister updated.  Noted that Pt has nonproductive cough, lung sounds clear and diminished, MD notified, received order for portable chest xray.  Problem: Clinical Measurements: Goal: Ability to maintain clinical measurements within normal limits will improve Outcome: Progressing   Problem: Nutrition: Goal: Adequate nutrition will be maintained Outcome: Progressing   Problem: Coping: Goal: Level of anxiety will decrease Outcome: Progressing   Problem: Elimination: Goal: Will not experience complications related to bowel motility Outcome: Progressing   Problem: Pain Managment: Goal: General experience of comfort will improve Outcome: Progressing   Problem: Safety: Goal: Ability to remain free from injury will improve Outcome: Progressing   Problem: Skin Integrity: Goal: Risk for impaired skin integrity will decrease Outcome: Progressing

## 2020-11-12 NOTE — NC FL2 (Signed)
[place] LEVEL OF CARE SCREENING TOOL     IDENTIFICATION  Patient Name: [name] Birthdate: 1956-05-29 Sex: male Admission Date (Current Location): 10/20/2020  [place] and [place] Number:  Kathleen Argue [id number] [place] and Address:  [facility] [place], [place] [address] [place], [place] [address]      Provider Number: [id number]  Attending Physician Name and Address:  Para Skeans, MD  Relative Name and Phone Number:  Narda Rutherford [facility]) Sister [phone]    Current Level of Care: Hospital Recommended Level of Care: [place] Prior Approval Number:    Date Approved/Denied:   PASRR Number:    Discharge Plan: SNF    Current Diagnoses: Patient Active Problem List   Diagnosis Date Noted  . Bowel obstruction ([place])   . S/P repair of paraesophageal hernia 10/26/2020  . Syncope 10/21/2020  . Hiatal hernia 10/21/2020  . Acute hypoxemic respiratory failure ([place]) 10/21/2020  . Sinus tachycardia 10/21/2020  . Gastric polyps   . Altered mental status 09/05/2020  . Emesis 07/24/2015  . Coffee ground emesis   . Gastrointestinal hemorrhage associated with other gastritis   . Acute esophagitis 01/09/2015  . Upper gastrointestinal bleed   . Diarrhea   . Nausea with vomiting   . GI bleed 01/08/2015  . GIB (gastrointestinal bleeding) 01/08/2015  . Nausea & vomiting 01/08/2015  . Reflux esophagitis 07/04/2009  . Loss of weight 07/04/2009  . BLOOD IN STOOL, OCCULT 07/04/2009  . ANEMIA, IRON DEFICIENCY 05/04/2008  . Hypothyroidism 04/05/2008  . Schizophrenia, unspecified type ([place]) 04/05/2008  . Depression 04/05/2008  . HEMIPARESIS, LEFT 04/05/2008  . Essential hypertension 04/05/2008  . GERD 04/05/2008  . Gastroparesis 04/05/2008  . Seizure ([place]) 04/05/2008  . History of diverticulosis 08/21/2004  . Barrett's esophagus 04/01/2002  . HIATAL HERNIA 04/01/2002    Orientation RESPIRATION BLADDER Height & Weight      Self, Situation, Place  Normal Incontinent Weight: 154 lb 8.7 oz (70.1 kg) Height:  $Remove'5\' 4"'NrqmUWG$  (162.6 cm)  BEHAVIORAL SYMPTOMS/MOOD NEUROLOGICAL BOWEL NUTRITION STATUS    Convulsions/Seizures Continent Diet (See discharge summary)  AMBULATORY STATUS COMMUNICATION OF NEEDS Skin   Limited Assist Verbally Surgical wounds                       Personal Care Assistance Level of Assistance  Bathing, Feeding, Dressing Bathing Assistance: Maximum assistance Feeding assistance: Limited assistance Dressing Assistance: Maximum assistance     Functional Limitations Info  Sight, Hearing, Speech Sight Info: Adequate Hearing Info: Adequate Speech Info: Adequate    SPECIAL CARE FACTORS FREQUENCY  PT (By licensed PT), OT (By licensed OT), Speech therapy     PT Frequency: 5x week OT Frequency: 5x week     Speech Therapy Frequency: 2x week      Contractures Contractures Info: Not present    Additional Factors Info  Code Status, Allergies, Insulin Sliding Scale Code Status Info: full Allergies Info: vancomycin   Insulin Sliding Scale Info: novolog: 0-15 units q4 hours       Current Medications (11/12/2020):  This is the current hospital active medication list Current Facility-Administered Medications  Medication Dose Route Frequency Provider Last Rate Last Admin  . 0.9 %  sodium chloride infusion  250 mL Intravenous Continuous Gold, Wayne E, PA-C 10 mL/hr at 11/02/20 0600 Rate Verify at 11/02/20 0600  . acetaminophen (TYLENOL) suppository 650 mg  650 mg Rectal Q4H PRN Gold, Wayne E, PA-C   650 mg at  11/04/20 2339  . albuterol (PROVENTIL) (2.5 MG/3ML) 0.083% nebulizer solution 2.5 mg  2.5 mg Nebulization Q2H PRN Gold, Wayne E, PA-C   2.5 mg at 10/21/20 2103  . bacitracin ointment   Topical Daily [place] Eric J, DO   1 application at 85/27/78 2423  . chlorhexidine gluconate (MEDLINE KIT) (PERIDEX) 0.12 % solution 15 mL  15 mL Mouth Rinse BID Gold, Wayne E, PA-C   15 mL at 11/12/20  0921  . Chlorhexidine Gluconate Cloth 2 % PADS 6 each  6 each Topical Daily Audria Nine, DO   6 each at 11/11/20 0457  . docusate (COLACE) 50 MG/5ML liquid 100 mg  100 mg Per Tube BID Iona Beard, MD   100 mg at 11/12/20 0903  . feeding supplement (OSMOLITE 1.2 CAL) liquid 1,000 mL  1,000 mL Per Tube Continuous [place] Eric J, DO 75 mL/hr at 11/10/20 1706 1,000 mL at 11/10/20 1706  . free water 74 mL  74 mL Per Tube Q4H [place] Eric J, DO   74 mL at 11/12/20 1242  . heparin injection 5,000 Units  5,000 Units Subcutaneous Q8H Gold, Wayne E, PA-C   5,000 Units at 11/12/20 0556  . influenza vac split quadrivalent PF (FLUARIX) injection 0.5 mL  0.5 mL Intramuscular Tomorrow-1000 Freda Jackson B, MD      . insulin aspart (novoLOG) injection 0-15 Units  0-15 Units Subcutaneous Q4H Iona Beard, MD   3 Units at 11/12/20 1241  . lamoTRIgine (LAMICTAL) tablet 100 mg  100 mg Per Tube Daily Gold, Wayne E, PA-C   100 mg at 11/12/20 0903  . levothyroxine (SYNTHROID) tablet 75 mcg  75 mcg Per Tube Q0600 Romilda Garret, RPH   75 mcg at 11/12/20 0556  . lip balm (CARMEX) ointment   Topical PRN Jadene Pierini E, PA-C      . LORazepam (ATIVAN) injection 1 mg  1 mg Intravenous Q6H PRN [place] Donnamarie Poag, DO      . ondansetron [facility]) injection 4 mg  4 mg Intravenous Q8H PRN Jadene Pierini E, PA-C   4 mg at 10/24/20 2306  . oxyCODONE (ROXICODONE) 5 MG/5ML solution 7.5 mg  7.5 mg Per Tube Q6H PRN [place] Donnamarie Poag, DO      . pantoprazole sodium (PROTONIX) 40 mg/20 mL oral suspension 40 mg  40 mg Per Tube BID Gold, Wayne E, PA-C   40 mg at 11/12/20 0904  . polyethylene glycol (MIRALAX / GLYCOLAX) packet 17 g  17 g Per Tube BID Iona Beard, MD   17 g at 11/12/20 0903  . QUEtiapine (SEROQUEL) tablet 400 mg  400 mg Per Tube BID Jadene Pierini E, PA-C   400 mg at 11/12/20 0903  . rosuvastatin (CRESTOR) tablet 20 mg  20 mg Per Tube Daily Gold, Wayne E, PA-C   20 mg at 11/12/20 0903  . sodium chloride flush (NS) 0.9 %  injection 10-40 mL  10-40 mL Intracatheter Q12H [place] Donnamarie Poag, DO   10 mL at 11/12/20 5361  . sodium chloride flush (NS) 0.9 % injection 10-40 mL  10-40 mL Intracatheter PRN [place] Donnamarie Poag, DO      . valproic acid (DEPAKENE) 250 MG/5ML solution 250 mg  250 mg Per Tube BID Romilda Garret, RPH   250 mg at 11/12/20 4431     Discharge Medications: Please see discharge summary for a list of discharge medications.  Relevant Imaging Results:  Relevant Lab Results:   Additional Information SSN [id number]  Joanne Chars,  LCSW

## 2020-11-12 NOTE — TOC Initial Note (Addendum)
Transition of Care [facility]) - Initial/Assessment Note    Patient Details  Name: [name] MRN: [id number] Date of Birth: 1956-04-10  Transition of [facility]) CM/SW Contact:    Lorri Frederick, LCSW Phone Number: 11/12/2020, 12:51 PM  Clinical Narrative:     Pt asleep when CSW came to room.  Sister [name] is POA, information from her.  Current DC plan is [facility], [name] agrees with back up plan to send out pt for SNF referral.  Pt is vaccinated for covid.  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  Pt was diagnosed MR in his 30s, has been in current group home since 2012.  [name] is POA but not legal guardian.  Pt also diagnosed with bipolar disorder and schizophrenia.  Only receiving outpt mental health med management at [facility] currently.  Choice document left in room and [name] will be there later to review.  [name] reports first choice still [facility].      1300: PASSR submitted, went to level 2.  FL2 and 30 day note put in, waiting on MD signature.  Pt sent out in hub.           Expected Discharge Plan: IP Rehab Facility Barriers to Discharge: Continued Medical Work up, Other (comment), SNF Pending bed offer ([facility] pending bed/insurance auth)   Patient Goals and CMS Choice   CMS Medicare.gov Compare Post Acute Care list provided to:: Patient Represenative (must comment) (sister [name] is POA) Choice offered to / list presented to : [facility] POA / Guardian  Expected Discharge Plan and Services Expected Discharge Plan: IP Rehab Facility     Post Acute Care Choice:  ([facility]) Living arrangements for the past 2 months: Group Home                                      Prior Living Arrangements/Services Living arrangements for the past 2 months: Group Home Lives with:: Facility Resident          Need for Family Participation in Patient Care: Yes (Comment) Care giver support system in place?: Yes (comment) Current home services: Other (comment) (none) Criminal Activity/Legal  Involvement Pertinent to Current Situation/Hospitalization: No - Comment as needed  Activities of Daily Living Home Assistive Devices/Equipment: Oxygen ADL Screening (condition at time of admission) Patient's cognitive ability adequate to safely complete daily activities?: No Is the patient deaf or have difficulty hearing?: No Does the patient have difficulty seeing, even when wearing glasses/contacts?: No Does the patient have difficulty concentrating, remembering, or making decisions?: Yes Patient able to express need for assistance with ADLs?: No Does the patient have difficulty dressing or bathing?: Yes Independently performs ADLs?: No Communication: Needs assistance Is this a change from baseline?: Pre-admission baseline Dressing (OT): Needs assistance Is this a change from baseline?: Pre-admission baseline Grooming: Needs assistance Is this a change from baseline?: Pre-admission baseline Feeding: Needs assistance Is this a change from baseline?: Pre-admission baseline Bathing: Needs assistance Is this a change from baseline?: Pre-admission baseline Toileting: Needs assistance Is this a change from baseline?: Pre-admission baseline In/Out Bed: Needs assistance Is this a change from baseline?: Pre-admission baseline Walks in Home: Needs assistance Is this a change from baseline?: Pre-admission baseline Does the patient have difficulty walking or climbing stairs?: No Weakness of Legs: None Weakness of Arms/Hands: None  Permission Sought/Granted  Emotional Assessment Appearance:: Appears stated age Attitude/Demeanor/Rapport: Unable to Assess Affect (typically observed): Unable to Assess Orientation: : Oriented to Self, Oriented to Place, Oriented to Situation Alcohol / Substance Use: Not Applicable Psych Involvement: Outpatient Provider ([facility])  Admission diagnosis:  Hiatal hernia [K44.9] Syncope [R55] Dyspnea, unspecified type [R06.00] S/P repair of  paraesophageal hernia [C12.751, Z87.19] Patient Active Problem List   Diagnosis Date Noted  . Bowel obstruction ([facility])   . S/P repair of paraesophageal hernia 10/26/2020  . Syncope 10/21/2020  . Hiatal hernia 10/21/2020  . Acute hypoxemic respiratory failure ([facility]) 10/21/2020  . Sinus tachycardia 10/21/2020  . Gastric polyps   . Altered mental status 09/05/2020  . Emesis 07/24/2015  . Coffee ground emesis   . Gastrointestinal hemorrhage associated with other gastritis   . Acute esophagitis 01/09/2015  . Upper gastrointestinal bleed   . Diarrhea   . Nausea with vomiting   . GI bleed 01/08/2015  . GIB (gastrointestinal bleeding) 01/08/2015  . Nausea & vomiting 01/08/2015  . Reflux esophagitis 07/04/2009  . Loss of weight 07/04/2009  . BLOOD IN STOOL, OCCULT 07/04/2009  . ANEMIA, IRON DEFICIENCY 05/04/2008  . Hypothyroidism 04/05/2008  . Schizophrenia, unspecified type ([facility]) 04/05/2008  . Depression 04/05/2008  . HEMIPARESIS, LEFT 04/05/2008  . Essential hypertension 04/05/2008  . GERD 04/05/2008  . Gastroparesis 04/05/2008  . Seizure ([facility]) 04/05/2008  . History of diverticulosis 08/21/2004  . Barrett's esophagus 04/01/2002  . HIATAL HERNIA 04/01/2002   PCP:  [facility] Pharmacy:   [facility] [phone] - [place], [place] - [address] AT [address] [facility] [address] [place] [address] Phone: [phone] Fax: [phone]     Social Determinants of Health (SDOH) Interventions    Readmission Risk Interventions No flowsheet data found.

## 2020-11-12 NOTE — Progress Notes (Signed)
PROGRESS NOTE    [name]  BEE:[id number] DOB: [birth year] DOA: 10/20/2020  PCP: [place]    Brief Narrative:  [name] is a 64 year old male with past medical history significant for ischemic CVA with left-sided deficits/dysphagia, Barrett's esophagus, GERD, gastroparesis, essential hypertension, hyperlipidemia, iron deficiency anemia, intellectual deficiency, schizophrenia, seizure disorder, hypothyroidism who presented to the ED via EMS from his group home for evaluation of fall and syncopal episode.  Per staff at group home, patient's fall unwitnessed and when trying to assist him to his feet he had a syncopal episode.  He was noted to be pale diaphoretic by EMS with tachycardia with HR 120-130.  SPO2 was noted to be 88% on room air.  CBG 255.  In the ED, afebrile with persistent tachycardia and tachypnea.  Requiring 3 L of supplemental oxygen.  Sodium 136, potassium 4.5, chloride 104, bicarb 19, anion gap 13, BUN 37, creatinine 1.7 with baseline 1.2, glucose 163, SARS-CoV-2 PCR negative.  Influenzinum panel negative.  D-dimer 0.62.  Procalcitonin 0.11, lactic acid 1.5.  Chest x-ray with no acute infiltrate but does note marked severely gastric and esophageal distention.  CT angiogram chest negative for PE.  CT abdomen/pelvis with large, stable gastric hernia with associated esophageal dilation and subsequent mass-effect on the trachea.  EDP discussed case with cardiothoracic surgery, Dr. Kipp Brood will consult.  Admitted to hospitalist service.  Patient was initially admitted to the hospital service on 10/21/2020, patient underwent EGD on 10/21/2020.  Early morning on 10/22/2020, rapid response as patient developed significant respiratory distress and was subsequently intubated and transferred to ICU under the critical care service.  Patient underwent hiatal hernia repair with G-tube placement by cardiothoracic surgery, Dr. Kipp Brood on 10/26/2020.   Patient was extubated on 11/05/2020.  Transferred back to hospital service on 11/06/2020.   Assessment & Plan: Acute hypoxic/hypercapnic respiratory failure, present on admission Aspiration vs ventilator associated pneumonia; Klebsiella PNA and Citrobacter Koseri Sepsis, not present on admission Patient presenting to ED from group home with syncopal episode and was noted to be in respiratory distress.  CT angiogram chest negative for PE and no evidence of acute infiltrate, pleural effusion or pneumothorax.  TTE with LVEF 58-83%, grade 1 diastolic dysfunction, no aortic stenosis, normal IVC.  Etiology likely secondary to his large hiatal hernia with compressive tracheal compression.  Rapid response 10/22/2020 with subsequent intubation for increased work of breathing.  During the initial hospitalization, patient was noted to develop pneumonia aspiration versus ventilator associated pneumonia and was treated with course of Unasyn followed by Bactrim in accordance with tracheal aspirate culture susceptibilities. --Currently oxygenating well on room air, occasionally will desaturate while sleeping; continue to monitor SPO2. --Cont recommendation.    Large hiatal hernia Barrett's esophagus GERD Gastroparesis CT abdomen/pelvis with large stable gastric hernia with associated esophageal dilation and subsequent mass-effect on the trachea, mild right upper lobe atelectasis.  Cardiothoracic surgery was consulted.  Patient underwent robotic assisted hiatal hernia repair by Dr. Kipp Brood on 10/26/2020 with PEG tube placement and IR placement of jejunal arm vent tube.  Postoperatively, course complicated by gastroparesis.  Initially requiring neostigmine and Reglan, now discontinued.  Tolerating tube feeds at goal. --Protonix 40 mg BID --Colace 162m BID --MiraLAX BID --zofran prn --Speech therapy consulted for reevaluation given patient now extubated and bowel function seems to be improved and at goal with  tube feeds. Pt is alert and awake and oriented to person place and president. TF continued.    Hypothyroidism --Levothyroxine 75  mcg daily --no change .   Schizophrenia Intellectual delay Seizure disorder --Valproic acid 250 mg BID --Lamictal 100 mg daily --stable cont Lamictal and valproate.   Hyperlipidemia: Crestor 20 mg daily Cont Crestor no change.   Essential hypertension On benazepril 20 mg p.o. daily and propranolol 10 mg p.o. daily at home. --BP 110/66 this morning --Continue to hold home antihypertensives --Monitor BP closely daily. Blood pressure 115/70, pulse 77, temperature 98.4 F (36.9 C), temperature source Oral, resp. rate 16, height 5' 4"  (1.626 m), weight 70.1 kg, SpO2 100 %. cont benazepril and propranolol.    History of CVA with left-sided deficits and dysphagia CT head without contrast with no acute intracranial abnormality but does note large area encephalomalacia involving right frontal lobe. --Continue tube feeds --PT/OT  Weakness, debility, deconditioning: --continue PT/OT efforts --Likely needs SNF vs LTACH vs CIR --TOC for placement    DVT prophylaxis: Heparin Code Status: Full code Family Communication: No family present at bedside this morning; attempted to contact patients sister (POA) [name] via telephone this am, unsuccessful  Disposition Plan:  Status is: Inpatient  Remains inpatient appropriate because:Unsafe d/c plan, IV treatments appropriate due to intensity of illness or inability to take PO and Inpatient level of care appropriate due to severity of illness   Dispo: The patient is from: Group home  Anticipated d/c is to: LTAC vs CIR vs SNF  Anticipated d/c date is: 2 days  Patient currently is not medically stable to d/c.  Need to ensure regular bowel movements and titration off of scheduled Reglan and neostigmine   Consultants:   PCCM - signed off 11/14  Cardiothoracic  surgery  [facility]  Procedures:   Intubation 10/30; extubated 11/13  EGD, Dr. Carlean Purl 10/30  Left [place] CVL 10/30 - 11/13  Robotic assisted hiatal hernia repair 11/3, Dr. Kipp Brood  PEG 11/3, jejunal arm vent tube placement IR 11/15   Subjective/Overview: Pt is alert and oriented and denies any complaints of chest pain head or eye or neck or nose  throat or chest or abdomen.Pt is ready for discharge once placement is available.  Lasbs are stable and hb is stable.   Pt is alert, oriented and asking about his lunch.  Pt is awaiting placement and Denies any complaints.  Objective: Vitals:   11/11/20 1939 11/12/20 0401 11/12/20 0832 11/12/20 0927  BP: (!) 126/93 115/71 115/70   Pulse: 83 85 87 77  Resp: 16 16 16    Temp: 99.7 F (37.6 C) 98.6 F (37 C) 98.4 F (36.9 C)   TempSrc: Oral Oral Oral   SpO2: 96% 97% 95% 100%  Weight:      Height:       SpO2: 100 % O2 Flow Rate (L/min): 4 L/min FiO2 (%): (S) 30 %  Intake/Output Summary (Last 24 hours) at 11/12/2020 1506 Last data filed at 11/12/2020 1417 Gross per 24 hour  Intake 210 ml  Output 1881 ml  Net -1671 ml   Filed Weights   11/09/20 0400 11/10/20 0500 11/11/20 0413  Weight: 74.4 kg 70.3 kg 70.1 kg    Examination: Blood pressure 115/70, pulse 77, temperature 98.4 F (36.9 C), temperature source Oral, resp. rate 16, height 5' 4"  (1.626 m), weight 70.1 kg, SpO2 100 %. General exam: Appears calm and comfortable  HEENT:EOMI, perrl  Respiratory system: Clear to auscultation. Respiratory effort normal. Cardiovascular system: S1 & S2 heard, RRR. No JVD, murmurs, rubs, gallops or clicks. No pedal edema. Gastrointestinal system: Abdomen is nondistended, soft and  nontender. No organomegaly or masses felt. Normal bowel sounds heard. Central nervous system: Alert and oriented.CN grossly intact No focal neurological deficits. Extremities: pt moving all 4 ext / no edema Skin: No rashes, lesions or ulcers Psychiatry:  Judgement and insight appear normal. Mood & affect appropriate.   Data Reviewed: I have personally reviewed following labs and imaging studies  Labs  No results for input(s): CKTOTAL, CKMB, TROPONINI in the last 72 hours. Lab Results  Component Value Date   WBC 5.9 11/12/2020   HGB 10.3 (L) 11/12/2020   HCT 33.2 (L) 11/12/2020   MCV 87.4 11/12/2020   PLT 333 11/12/2020    Recent Labs  Lab 11/12/20 0115  NA 135  K 4.0  CL 101  CO2 24  BUN 12  CREATININE 0.83  CALCIUM 9.0  GLUCOSE 212*   Lab Results  Component Value Date   TRIG 173 (H) 10/31/2020   Lab Results  Component Value Date   DDIMER 0.62 (H) 10/20/2020   Invalid input(s): POCBNP  echocardiogram   Radiology Studies: No results found.    Current Facility-Administered Medications (Endocrine & Metabolic):  .  insulin aspart (novoLOG) injection 0-15 Units .  levothyroxine (SYNTHROID) tablet 75 mcg   Current Facility-Administered Medications (Cardiovascular):  .  rosuvastatin (CRESTOR) tablet 20 mg   Current Facility-Administered Medications (Respiratory):  .  albuterol (PROVENTIL) (2.5 MG/3ML) 0.083% nebulizer solution 2.5 mg   Current Facility-Administered Medications (Analgesics):  .  acetaminophen (TYLENOL) suppository 650 mg .  oxyCODONE (ROXICODONE) 5 MG/5ML solution 7.5 mg   Current Facility-Administered Medications (Hematological):  .  heparin injection 5,000 Units   Current Facility-Administered Medications (Other):  .  0.9 %  sodium chloride infusion .  bacitracin ointment .  chlorhexidine gluconate (MEDLINE KIT) (PERIDEX) 0.12 % solution 15 mL .  Chlorhexidine Gluconate Cloth 2 % PADS 6 each .  docusate (COLACE) 50 MG/5ML liquid 100 mg .  feeding supplement (OSMOLITE 1.2 CAL) liquid 1,000 mL .  free water 74 mL .  influenza vac split quadrivalent PF (FLUARIX) injection 0.5 mL .  lamoTRIgine (LAMICTAL) tablet 100 mg .  lip balm (CARMEX) ointment .  LORazepam (ATIVAN) injection 1  mg .  ondansetron (ZOFRAN) injection 4 mg .  pantoprazole sodium (PROTONIX) 40 mg/20 mL oral suspension 40 mg .  polyethylene glycol (MIRALAX / GLYCOLAX) packet 17 g .  QUEtiapine (SEROQUEL) tablet 400 mg .  sodium chloride flush (NS) 0.9 % injection 10-40 mL .  sodium chloride flush (NS) 0.9 % injection 10-40 mL .  valproic acid (DEPAKENE) 250 MG/5ML solution 250 mg  No current outpatient medications on file.  Anti-infectives (From admission, onward)   Start     Dose/Rate Route Frequency Ordered Stop   11/02/20 2200  sulfamethoxazole-trimethoprim (BACTRIM) 200-40 MG/5ML suspension 20 mL        20 mL Per Tube Every 12 hours 11/02/20 1534 11/07/20 0957   11/02/20 1115  sulfamethoxazole-trimethoprim (BACTRIM) 200-40 MG/5ML suspension 20 mL  Status:  Discontinued        20 mL Oral Every 12 hours 11/02/20 1020 11/02/20 1534   10/21/20 0500  Ampicillin-Sulbactam (UNASYN) 3 g in sodium chloride 0.9 % 100 mL IVPB  Status:  Discontinued        3 g 200 mL/hr over 30 Minutes Intravenous Every 6 hours 10/21/20 0430 10/24/20 0901       Continuous Infusions: . sodium chloride 10 mL/hr at 11/02/20 0600  . feeding supplement (OSMOLITE 1.2 CAL) 1,000 mL (11/10/20 1706)  LOS: 67 days    Para Skeans, MD [facility] Pager [phone] How to contact the [facility] Attending or Consulting provider [place] or covering provider during after hours [place], for this patient?    1. Check the care team in [facility] and look for a) attending/consulting [facility] provider listed and b) the [facility] team listed 2. Log into [email or web address] and use [place]'s universal password to access. If you do not have the password, please contact the hospital operator. 3. Locate the [facility] provider you are looking for under [facility] and page to a number that you can be directly reached. 4. If you still have difficulty reaching the provider, please page the [facility] (Director on Call) for the Hospitalists listed on amion for  assistance. [email or web address] Password [facility] 11/12/2020, 3:06 PM

## 2020-11-13 ENCOUNTER — Encounter (HOSPITAL_COMMUNITY): Payer: Self-pay | Admitting: Internal Medicine

## 2020-11-13 ENCOUNTER — Inpatient Hospital Stay (HOSPITAL_COMMUNITY): Payer: Medicare Other

## 2020-11-13 DIAGNOSIS — R55 Syncope and collapse: Secondary | ICD-10-CM | POA: Diagnosis not present

## 2020-11-13 DIAGNOSIS — E119 Type 2 diabetes mellitus without complications: Secondary | ICD-10-CM

## 2020-11-13 DIAGNOSIS — J9601 Acute respiratory failure with hypoxia: Secondary | ICD-10-CM | POA: Diagnosis not present

## 2020-11-13 DIAGNOSIS — E039 Hypothyroidism, unspecified: Secondary | ICD-10-CM | POA: Diagnosis not present

## 2020-11-13 HISTORY — DX: Type 2 diabetes mellitus without complications: E11.9

## 2020-11-13 LAB — CBC
HCT: 38.2 % — ABNORMAL LOW (ref 39.0–52.0)
HCT: 35.1 % — ABNORMAL LOW (ref 39.0–52.0)
Hemoglobin: 11.7 g/dL — ABNORMAL LOW (ref 13.0–17.0)
Hemoglobin: 10.8 g/dL — ABNORMAL LOW (ref 13.0–17.0)
MCH: 26.7 pg (ref 26.0–34.0)
MCH: 26.5 pg (ref 26.0–34.0)
MCHC: 30.6 g/dL (ref 30.0–36.0)
MCHC: 30.8 g/dL (ref 30.0–36.0)
MCV: 87.2 fL (ref 80.0–100.0)
MCV: 86 fL (ref 80.0–100.0)
Platelets: 360 10*3/uL (ref 150–400)
Platelets: 320 10*3/uL (ref 150–400)
RBC: 4.38 MIL/uL (ref 4.22–5.81)
RBC: 4.08 MIL/uL — ABNORMAL LOW (ref 4.22–5.81)
RDW: 19.6 % — ABNORMAL HIGH (ref 11.5–15.5)
RDW: 19.2 % — ABNORMAL HIGH (ref 11.5–15.5)
WBC: 5.4 10*3/uL (ref 4.0–10.5)
WBC: 5.9 10*3/uL (ref 4.0–10.5)
nRBC: 0.4 % — ABNORMAL HIGH (ref 0.0–0.2)
nRBC: 0 % (ref 0.0–0.2)

## 2020-11-13 LAB — BASIC METABOLIC PANEL
Anion gap: 10 (ref 5–15)
BUN: 15 mg/dL (ref 8–23)
CO2: 28 mmol/L (ref 22–32)
Calcium: 9.3 mg/dL (ref 8.9–10.3)
Chloride: 97 mmol/L — ABNORMAL LOW (ref 98–111)
Creatinine, Ser: 0.89 mg/dL (ref 0.61–1.24)
GFR, Estimated: >60 mL/min (ref >60)
Glucose, Bld: 125 mg/dL — ABNORMAL HIGH (ref 70–99)
Potassium: 4.5 mmol/L (ref 3.5–5.1)
Sodium: 135 mmol/L (ref 135–145)

## 2020-11-13 LAB — COMPREHENSIVE METABOLIC PANEL
ALT: 15 U/L (ref 0–44)
AST: 20 U/L (ref 15–41)
Albumin: 2.8 g/dL — ABNORMAL LOW (ref 3.5–5.0)
Alkaline Phosphatase: 101 U/L (ref 38–126)
Anion gap: 9 (ref 5–15)
BUN: 13 mg/dL (ref 8–23)
CO2: 26 mmol/L (ref 22–32)
Calcium: 9.2 mg/dL (ref 8.9–10.3)
Chloride: 99 mmol/L (ref 98–111)
Creatinine, Ser: 0.89 mg/dL (ref 0.61–1.24)
GFR, Estimated: >60 mL/min (ref >60)
Glucose, Bld: 169 mg/dL — ABNORMAL HIGH (ref 70–99)
Potassium: 4.3 mmol/L (ref 3.5–5.1)
Sodium: 134 mmol/L — ABNORMAL LOW (ref 135–145)
Total Bilirubin: 0.4 mg/dL (ref 0.3–1.2)
Total Protein: 7.8 g/dL (ref 6.5–8.1)

## 2020-11-13 LAB — D-DIMER, QUANTITATIVE: D-Dimer, Quant: 4.63 ug/mL-FEU — ABNORMAL HIGH (ref 0.00–0.50)

## 2020-11-13 LAB — GLUCOSE, CAPILLARY
Glucose-Capillary: 212 mg/dL — ABNORMAL HIGH (ref 70–99)
Glucose-Capillary: 154 mg/dL — ABNORMAL HIGH (ref 70–99)
Glucose-Capillary: 187 mg/dL — ABNORMAL HIGH (ref 70–99)
Glucose-Capillary: 131 mg/dL — ABNORMAL HIGH (ref 70–99)
Glucose-Capillary: 175 mg/dL — ABNORMAL HIGH (ref 70–99)

## 2020-11-13 LAB — PHOSPHORUS: Phosphorus: 3.8 mg/dL (ref 2.5–4.6)

## 2020-11-13 LAB — MAGNESIUM: Magnesium: 1.9 mg/dL (ref 1.7–2.4)

## 2020-11-13 MED ORDER — IOHEXOL 350 MG/ML SOLN
100.0000 mL | Freq: Once | INTRAVENOUS | Status: AC | PRN
  Administered 2020-11-13: 100 mL via INTRAVENOUS

## 2020-11-13 NOTE — Plan of Care (Signed)

## 2020-11-13 NOTE — Plan of Care (Signed)
  Problem: Clinical Measurements: Goal: Ability to maintain clinical measurements within normal limits will improve Outcome: Progressing   Problem: Activity: Goal: Risk for activity intolerance will decrease Outcome: Progressing   Problem: Pain Managment: Goal: General experience of comfort will improve Outcome: Progressing   Problem: Safety: Goal: Ability to remain free from injury will improve Outcome: Progressing   Problem: Skin Integrity: Goal: Risk for impaired skin integrity will decrease Outcome: Progressing   

## 2020-11-13 NOTE — Progress Notes (Addendum)
PROGRESS NOTE    [name]  VCB:[id number] DOB: Mar 06, 1956 DOA: 10/20/2020  PCP: [place]    Brief Narrative:  [name] is a 64 year old male with past medical history significant for ischemic CVA with left-sided deficits/dysphagia, Barrett's esophagus, GERD, gastroparesis, essential hypertension, hyperlipidemia, iron deficiency anemia, intellectual deficiency, schizophrenia, seizure disorder, hypothyroidism who presented to the ED via EMS from his group home for evaluation of fall and syncopal episode.  Per staff at group home, patient's fall unwitnessed and when trying to assist him to his feet he had a syncopal episode.  He was noted to be pale diaphoretic by EMS with tachycardia with HR 120-130.  SPO2 was noted to be 88% on room air.  CBG 255.  In the ED, afebrile with persistent tachycardia and tachypnea.  Requiring 3 L of supplemental oxygen.  Sodium 136, potassium 4.5, chloride 104, bicarb 19, anion gap 13, BUN 37, creatinine 1.7 with baseline 1.2, glucose 163, SARS-CoV-2 PCR negative.  Influenzinum panel negative.  D-dimer 0.62.  Procalcitonin 0.11, lactic acid 1.5.  Chest x-ray with no acute infiltrate but does note marked severely gastric and esophageal distention.  CT angiogram chest negative for PE.  CT abdomen/pelvis with large, stable gastric hernia with associated esophageal dilation and subsequent mass-effect on the trachea.  EDP discussed case with cardiothoracic surgery, Dr. Kipp Brood will consult.  Admitted to hospitalist service.  Patient was initially admitted to the hospital service on 10/21/2020, patient underwent EGD on 10/21/2020.  Early morning on 10/22/2020, rapid response as patient developed significant respiratory distress and was subsequently intubated and transferred to ICU under the critical care service.  Patient underwent hiatal hernia repair with G-tube placement by cardiothoracic surgery, Dr. Kipp Brood on 10/26/2020.   Patient was extubated on 11/05/2020.  Transferred back to hospital service on 11/06/2020.   Assessment & Plan: Acute hypoxic/hypercapnic respiratory failure, present on admission Aspiration vs ventilator associated pneumonia; Klebsiella PNA and Citrobacter Koseri Sepsis, not present on admission Patient presenting to ED from group home with syncopal episode and was noted to be in respiratory distress.  CT angiogram chest negative for PE and no evidence of acute infiltrate, pleural effusion or pneumothorax.  TTE with LVEF 38-46%, grade 1 diastolic dysfunction, no aortic stenosis, normal IVC.  Etiology likely secondary to his large hiatal hernia with compressive tracheal compression.  Rapid response 10/22/2020 with subsequent intubation for increased work of breathing.  During the initial hospitalization, patient was noted to develop pneumonia aspiration versus ventilator associated pneumonia and was treated with course of Unasyn followed by Bactrim in accordance with tracheal aspirate culture susceptibilities. --Currently oxygenating well on room air, occasionally will desaturate while sleeping; continue to monitor SPO2. --Cont recommendation.  --Chest xray done shows streaky basilar opacities and we will obtain cta chest.   Large hiatal hernia Barrett's esophagus GERD Gastroparesis CT abdomen/pelvis with large stable gastric hernia with associated esophageal dilation and subsequent mass-effect on the trachea, mild right upper lobe atelectasis.  Cardiothoracic surgery was consulted.  Patient underwent robotic assisted hiatal hernia repair by Dr. Kipp Brood on 10/26/2020 with PEG tube placement and IR placement of jejunal arm vent tube.  Postoperatively, course complicated by gastroparesis.  Initially requiring neostigmine and Reglan, now discontinued.  Tolerating tube feeds at goal. --Protonix 40 mg BID --Colace 122m BID --MiraLAX BID --zofran prn --Speech therapy consulted for reevaluation  given patient now extubated and bowel function seems to be improved and at goal with tube feeds. Pt is alert and awake and oriented  to person place and president. TF continued.  No change in TF regimen.     Hypothyroidism --Levothyroxine 75 mcg daily --no change .   Schizophrenia Intellectual delay Seizure disorder --Valproic acid 250 mg BID --Lamictal 100 mg daily --stable cont Lamictal and valproate.   Hyperlipidemia: Crestor 20 mg daily Cont Crestor no change.   Essential hypertension On benazepril 20 mg p.o. daily and propranolol 10 mg p.o. daily at home. --BP 110/66 this morning --Continue to hold home antihypertensives --Monitor BP closely daily. Blood pressure 108/69, pulse 68, temperature 97.9 F (36.6 C), temperature source Oral, resp. rate 17, height 5' 4"  (1.626 m), weight 69.2 kg, SpO2 93 %. cont benazepril and propranolol.    History of CVA with left-sided deficits and dysphagia CT head without contrast with no acute intracranial abnormality but does note large area encephalomalacia involving right frontal lobe. --Continue tube feeds --PT/OT  Weakness, debility, deconditioning: --continue PT/OT efforts --Likely needs SNF vs LTACH vs CIR --TOC for placement  DM II --a1c is 5.8 and we will continue current regimen of SSI, accucheck per glycemic protocol and  Follow.  DVT prophylaxis: Heparin Code Status: Full code Family Communication: No family present at bedside this morning; attempted to contact patients sister (POA) [name] via telephone this am, unsuccessful.  Disposition Plan:  Status is: Inpatient  Remains inpatient appropriate because:Unsafe d/c plan, IV treatments appropriate due to intensity of illness or inability to take PO and Inpatient level of care appropriate due to severity of illness   Dispo: The patient is from: Group home  Anticipated d/c is to: LTAC vs CIR vs SNF  Anticipated d/c date is: 2  days  Patient currently is not medically stable to d/c.  Need to ensure regular bowel movements and titration off of scheduled Reglan and neostigmine   Consultants:   PCCM - signed off 11/14  Cardiothoracic surgery  [facility]  Procedures:   Intubation 10/30; extubated 11/13  EGD, Dr. Carlean Purl 10/30  Left [place] CVL 10/30 - 11/13  Robotic assisted hiatal hernia repair 11/3, Dr. Kipp Brood  PEG 11/3, jejunal arm vent tube placement IR 11/15   Subjective/Overview: Pt is alert and oriented and denies any complaints of chest pain head or eye or neck or nose  throat or chest or abdomen.Pt is ready for discharge once placement is available.  Lasbs are stable and hb is stable.   Pt is alert, oriented and asking about his lunch.  Pt is awaiting placement and Denies any complaints.   Pt is resting and wakes up for me and lets me examine him. Chest xray most likely c/w atelectasis however due to pos d-dimer we will do cta chest as d-dimer is high but pt is poor anticoagulation risk due to his h/o GIB in past.  Called sister debbie and wants [place] to call her with results even if they are late in night. D/w her plan for his cta for f/u of his chest xray.    Objective: Vitals:   11/13/20 0437 11/13/20 0715 11/13/20 1315 11/13/20 1537  BP:  105/70 108/69   Pulse:  72 68   Resp:  17 17   Temp:  98.3 F (36.8 C) 97.9 F (36.6 C)   TempSrc:  Oral Oral   SpO2:  98% 98% 93%  Weight: 69.2 kg     Height:       SpO2: 93 % O2 Flow Rate (L/min): 4 L/min FiO2 (%): (S) 30 %  Intake/Output Summary (Last 24 hours) at  11/13/2020 1642 Last data filed at 11/13/2020 1537 Gross per 24 hour  Intake 0 ml  Output 1680 ml  Net -1680 ml   Filed Weights   11/10/20 0500 11/11/20 0413 11/13/20 0437  Weight: 70.3 kg 70.1 kg 69.2 kg    Examination: Blood pressure 108/69, pulse 68, temperature 97.9 F (36.6 C), temperature source Oral, resp. rate 17, height 5' 4"  (1.626 m), weight  69.2 kg, SpO2 93 %. General exam: Appears calm and comfortable  HEENT:EOMI, perrl  Respiratory system: Clear to auscultation. Respiratory effort normal. Cardiovascular system: S1 & S2 heard, RRR. No JVD, murmurs, rubs, gallops or clicks. No pedal edema. Gastrointestinal system: Abdomen is nondistended, soft and nontender. No organomegaly or masses felt. Normal bowel sounds heard. Central nervous system: Alert and oriented.CN grossly intact No focal neurological deficits. Extremities: pt moving all 4 ext / no edema Skin: No rashes, lesions or ulcers Psychiatry: Judgement and insight appear normal. Mood & affect appropriate.   Data Reviewed: I have personally reviewed following labs and imaging studies  Labs  No results for input(s): CKTOTAL, CKMB, TROPONINI in the last 72 hours. Lab Results  Component Value Date   WBC 5.9 11/13/2020   HGB 10.8 (L) 11/13/2020   HCT 35.1 (L) 11/13/2020   MCV 86.0 11/13/2020   PLT 320 11/13/2020    Recent Labs  Lab 11/13/20 0807  NA 134*  K 4.3  CL 99  CO2 26  BUN 13  CREATININE 0.89  CALCIUM 9.2  PROT 7.8  BILITOT 0.4  ALKPHOS 101  ALT 15  AST 20  GLUCOSE 169*   Lab Results  Component Value Date   TRIG 173 (H) 10/31/2020   Lab Results  Component Value Date   DDIMER 4.63 (H) 11/13/2020   Invalid input(s): POCBNP  echocardiogram   Radiology Studies: [facility] Chest Port 1 View  Result Date: 11/12/2020 CLINICAL DATA:  Loss of consciousness, cough EXAM: PORTABLE CHEST 1 VIEW COMPARISON:  Radiograph 11/04/2020 FINDINGS: Removal of a previously seen endotracheal tube. Partial visualization of a percutaneous gastrojejunostomy tube in the upper abdomen. Telemetry leads overlie the chest. Lung volumes are low with streaky opacities favoring atelectatic changes and vascular crowding. No consolidative opacity is seen. No pneumothorax or visible effusion. The cardiomediastinal contours are unremarkable. No acute osseous or soft tissue abnormality.  IMPRESSION: Low lung volumes with streaky opacities basilar opacities. Aspiration/airspace disease is not fully excluded. Electronically Signed   By: Lovena Le M.D.   On: 11/12/2020 19:48      Current Facility-Administered Medications (Endocrine & Metabolic):  .  insulin aspart (novoLOG) injection 0-15 Units .  levothyroxine (SYNTHROID) tablet 75 mcg   Current Facility-Administered Medications (Cardiovascular):  .  rosuvastatin (CRESTOR) tablet 20 mg   Current Facility-Administered Medications (Respiratory):  .  albuterol (PROVENTIL) (2.5 MG/3ML) 0.083% nebulizer solution 2.5 mg   Current Facility-Administered Medications (Analgesics):  .  acetaminophen (TYLENOL) suppository 650 mg .  oxyCODONE (ROXICODONE) 5 MG/5ML solution 7.5 mg   Current Facility-Administered Medications (Hematological):  .  heparin injection 5,000 Units   Current Facility-Administered Medications (Other):  .  0.9 %  sodium chloride infusion .  bacitracin ointment .  chlorhexidine gluconate (MEDLINE KIT) (PERIDEX) 0.12 % solution 15 mL .  Chlorhexidine Gluconate Cloth 2 % PADS 6 each .  docusate (COLACE) 50 MG/5ML liquid 100 mg .  feeding supplement (OSMOLITE 1.2 CAL) liquid 1,000 mL .  free water 74 mL .  influenza vac split quadrivalent PF (FLUARIX) injection 0.5 mL .  lamoTRIgine (LAMICTAL) tablet 100 mg .  lip balm (CARMEX) ointment .  LORazepam (ATIVAN) injection 1 mg .  ondansetron (ZOFRAN) injection 4 mg .  pantoprazole sodium (PROTONIX) 40 mg/20 mL oral suspension 40 mg .  polyethylene glycol (MIRALAX / GLYCOLAX) packet 17 g .  QUEtiapine (SEROQUEL) tablet 400 mg .  sodium chloride flush (NS) 0.9 % injection 10-40 mL .  sodium chloride flush (NS) 0.9 % injection 10-40 mL .  valproic acid (DEPAKENE) 250 MG/5ML solution 250 mg  No current outpatient medications on file.  Anti-infectives (From admission, onward)   Start     Dose/Rate Route Frequency Ordered Stop   11/02/20 2200   sulfamethoxazole-trimethoprim (BACTRIM) 200-40 MG/5ML suspension 20 mL        20 mL Per Tube Every 12 hours 11/02/20 1534 11/07/20 0957   11/02/20 1115  sulfamethoxazole-trimethoprim (BACTRIM) 200-40 MG/5ML suspension 20 mL  Status:  Discontinued        20 mL Oral Every 12 hours 11/02/20 1020 11/02/20 1534   10/21/20 0500  Ampicillin-Sulbactam (UNASYN) 3 g in sodium chloride 0.9 % 100 mL IVPB  Status:  Discontinued        3 g 200 mL/hr over 30 Minutes Intravenous Every 6 hours 10/21/20 0430 10/24/20 0901       Continuous Infusions: . sodium chloride 10 mL/hr at 11/02/20 0600  . feeding supplement (OSMOLITE 1.2 CAL) 1,000 mL (11/13/20 0948)     LOS: 23 days    Para Skeans, MD [facility] Pager [phone] How to contact the [facility] Attending or Consulting provider [place] or covering provider during after hours [place], for this patient?    1. Check the care team in [facility] and look for a) attending/consulting [facility] provider listed and b) the [facility] team listed 2. Log into [email or web address] and use [place]'s universal password to access. If you do not have the password, please contact the hospital operator. 3. Locate the [facility] provider you are looking for under [facility] and page to a number that you can be directly reached. 4. If you still have difficulty reaching the provider, please page the [facility] (Director on Call) for the Hospitalists listed on amion for assistance. [email or web address] Password [facility] 11/13/2020, 4:42 PM

## 2020-11-13 NOTE — Progress Notes (Addendum)
Pt returned to unit from CT, Pt alert x oriented to self, place, situation, disoriented to time, vital signs taken and recorded. Pt denies pain 0/10.   At 20:20 endorsed accordingly to oncoming RN.

## 2020-11-13 NOTE — Plan of Care (Signed)
  Problem: Health Behavior/Discharge Planning: Goal: Ability to manage health-related needs will improve Outcome: Progressing   Problem: Clinical Measurements: Goal: Ability to maintain clinical measurements within normal limits will improve Outcome: Progressing Goal: Diagnostic test results will improve Outcome: Progressing   

## 2020-11-14 DIAGNOSIS — E119 Type 2 diabetes mellitus without complications: Secondary | ICD-10-CM | POA: Diagnosis not present

## 2020-11-14 DIAGNOSIS — R55 Syncope and collapse: Secondary | ICD-10-CM | POA: Diagnosis not present

## 2020-11-14 DIAGNOSIS — J9601 Acute respiratory failure with hypoxia: Secondary | ICD-10-CM | POA: Diagnosis not present

## 2020-11-14 LAB — CBC
HCT: 34.5 % — ABNORMAL LOW (ref 39.0–52.0)
Hemoglobin: 10.6 g/dL — ABNORMAL LOW (ref 13.0–17.0)
MCH: 26.6 pg (ref 26.0–34.0)
MCHC: 30.7 g/dL (ref 30.0–36.0)
MCV: 86.5 fL (ref 80.0–100.0)
Platelets: 350 10*3/uL (ref 150–400)
RBC: 3.99 MIL/uL — ABNORMAL LOW (ref 4.22–5.81)
RDW: 19.2 % — ABNORMAL HIGH (ref 11.5–15.5)
WBC: 6.3 10*3/uL (ref 4.0–10.5)
nRBC: 0 % (ref 0.0–0.2)

## 2020-11-14 LAB — BASIC METABOLIC PANEL
Anion gap: 12 (ref 5–15)
BUN: 15 mg/dL (ref 8–23)
CO2: 25 mmol/L (ref 22–32)
Calcium: 9.3 mg/dL (ref 8.9–10.3)
Chloride: 97 mmol/L — ABNORMAL LOW (ref 98–111)
Creatinine, Ser: 0.93 mg/dL (ref 0.61–1.24)
GFR, Estimated: >60 mL/min (ref >60)
Glucose, Bld: 193 mg/dL — ABNORMAL HIGH (ref 70–99)
Potassium: 4.4 mmol/L (ref 3.5–5.1)
Sodium: 134 mmol/L — ABNORMAL LOW (ref 135–145)

## 2020-11-14 LAB — GLUCOSE, CAPILLARY
Glucose-Capillary: 136 mg/dL — ABNORMAL HIGH (ref 70–99)
Glucose-Capillary: 148 mg/dL — ABNORMAL HIGH (ref 70–99)
Glucose-Capillary: 170 mg/dL — ABNORMAL HIGH (ref 70–99)
Glucose-Capillary: 175 mg/dL — ABNORMAL HIGH (ref 70–99)
Glucose-Capillary: 181 mg/dL — ABNORMAL HIGH (ref 70–99)
Glucose-Capillary: 182 mg/dL — ABNORMAL HIGH (ref 70–99)

## 2020-11-14 MED ORDER — AZITHROMYCIN 200 MG/5ML PO SUSR
500.0000 mg | Freq: Every day | ORAL | Status: DC
  Administered 2020-11-15: 500 mg
  Filled 2020-11-14: qty 15

## 2020-11-14 MED ORDER — AZITHROMYCIN 500 MG PO TABS
500.0000 mg | ORAL_TABLET | Freq: Every day | ORAL | Status: DC
  Administered 2020-11-14: 500 mg via ORAL
  Filled 2020-11-14: qty 1

## 2020-11-14 NOTE — Progress Notes (Signed)
Physical Therapy Treatment Patient Details Name: [name] MRN: [id number] DOB: [birth year] Today's Date: 11/14/2020    History of Present Illness 64 y.o male presented 10/21/20 with syncope and fall at his group home. Workup showed large hiatal hernia compressing trachea. +aspiration pnemonia; intubated 10/30; 11/03 hiatal hernia repair with PEG placement; extubated 11/12; encephalopathy; scrotal edema  PMH includes GERD, Barrett esophagus, gastroparesis, HTN, stroke with L sided deficits, craniotomy for brain cyst, schizophrenia, intellectual disability.    PT Comments    Pt supine on arrival, on bed pan but ready to get off and pt sister present in room and supportive. Pt performed rolling with Supervision, bed mobility with minA and cues for encouragement, sit<>stand multiple reps from bed/chair with min/modA and stand pivot transfer from EOB>chair with +52minA for safety. Pt more impulsive this session and with limited standing tolerance, able to stand ~1 minute at a time prior to requesting to sit and with decreased eccentric control to sit despite cues for controlled transfer/safety. Pt performed seated/standing BLE A/AAROM therapeutic exercises as detailed below with good tolerance, and reports 5/10 modified RPE (fatigue) at end of session. VSS on RA during mobility tasks. Pt continues to benefit from PT services to progress toward functional mobility goals. D/C recs below remain appropriate.  Follow Up Recommendations  CIR;Supervision/Assistance - 24 hour     Equipment Recommendations  Other (comment) (TBD next venue)    Recommendations for Other Services Rehab consult     Precautions / Restrictions Precautions Precautions: Fall Precaution Comments: Lt hemiparesis, PEG Restrictions Weight Bearing Restrictions: No    Mobility  Bed Mobility Overal bed mobility: Needs Assistance Bed Mobility: Supine to Sit;Rolling Rolling: Supervision   Supine to sit: Min assist      General bed mobility comments: minA for trunk rise from flat HOB, needs vc/tcs for BLE placement  Transfers Overall transfer level: Needs assistance Equipment used: Rolling walker (2 wheeled) Transfers: Sit to/from [employer] Sit to Stand: Min assist;Mod assist Stand pivot transfers: Min assist;+2 physical assistance;+2 safety/equipment       General transfer comment: from EOB>chair, pt needs +22minA for safety due to feeding tube and catheter lines and pt impulsivity, pt with posterior lean prior to proximity to chair; pt minA to stand from EOB with +1 assist and up to modA to stand from chair height with +1 assist (x5 total reps)  Ambulation/Gait Ambulation/Gait assistance: Min assist;+2 physical assistance;+2 safety/equipment Gait Distance (Feet): 5 Feet Assistive device: Rolling walker (2 wheeled) Gait Pattern/deviations: Step-to pattern;Decreased step length - left;Decreased weight shift to left;Shuffle Gait velocity: decreased   General Gait Details: bed>chair, needs heavy vcs and assist to manage RW, pt impulsive to sit prior to reaching chair   Stairs             Wheelchair Mobility    Modified Rankin (Stroke Patients Only)       Balance Overall balance assessment: Needs assistance Sitting-balance support: Feet supported;No upper extremity supported Sitting balance-Leahy Scale: Fair Sitting balance - Comments: Supervision for static sitting   Standing balance support: Bilateral upper extremity supported;During functional activity Standing balance-Leahy Scale: Poor Standing balance comment: UE and external support, pt with posterior lean during short gait trial bed>chair                            Cognition Arousal/Alertness: Awake/alert Behavior During Therapy: Flat affect Overall Cognitive Status: History of cognitive impairments - at baseline  General Comments: Hx of  intellectual disability. pt impulsive at times, but good following of 1-step commands      Exercises General Exercises - Lower Extremity Ankle Circles/Pumps: AROM;AAROM;Both;10 reps;Supine Long Arc Quad: AAROM;Both;Seated;20 reps (10 on LLE and 20 reps on RLE, mostly AROM on R and AA on L) Hip Flexion/Marching: AAROM;Both;10 reps;Seated;AROM (AA on LLE and AROM on RLE) Other Exercises Other Exercises: STS x 5 reps for BLE strengthening    General Comments General comments (skin integrity, edema, etc.): Pt SpO2 94-95% on RA and HR 98-102 bpm with activity; pt denies pain/dizziness and BP not further assessed      Pertinent Vitals/Pain Pain Assessment: No/denies pain Pain Intervention(s): Monitored during session    Home Living                      Prior Function            PT Goals (current goals can now be found in the care plan section) Acute Rehab PT Goals Patient Stated Goal: to get back to normal per sister  PT Goal Formulation: Patient unable to participate in goal setting Time For Goal Achievement: 11/20/20 Potential to Achieve Goals: Fair Progress towards PT goals: Progressing toward goals    Frequency    Min 3X/week      PT Plan Current plan remains appropriate    Co-evaluation              AM-PAC PT "6 Clicks" Mobility   Outcome Measure  Help needed turning from your back to your side while in a flat bed without using bedrails?: None Help needed moving from lying on your back to sitting on the side of a flat bed without using bedrails?: A Little Help needed moving to and from a bed to a chair (including a wheelchair)?: A Lot Help needed standing up from a chair using your arms (e.g., wheelchair or bedside chair)?: A Lot Help needed to walk in hospital room?: A Lot Help needed climbing 3-5 steps with a railing? : Total 6 Click Score: 14    End of Session Equipment Utilized During Treatment: Gait belt Activity Tolerance: Patient  tolerated treatment well Patient left: in chair;with call bell/phone within reach;with chair alarm set;with family/visitor present Nurse Communication: Mobility status PT Visit Diagnosis: Other abnormalities of gait and mobility (R26.89);Muscle weakness (generalized) (M62.81)     Time: 0539-7673 PT Time Calculation (min) (ACUTE ONLY): 24 min  Charges:  $Therapeutic Exercise: 8-22 mins $Therapeutic Activity: 8-22 mins                     Arihana Ambrocio P., PTA Acute Rehabilitation Services Pager: [phone] Office: [phone]   [place] 11/14/2020, 9:25 AM

## 2020-11-14 NOTE — Plan of Care (Signed)
  Problem: Health Behavior/Discharge Planning: Goal: Ability to manage health-related needs will improve Outcome: Progressing   Problem: Clinical Measurements: Goal: Ability to maintain clinical measurements within normal limits will improve Outcome: Progressing   Problem: Elimination: Goal: Will not experience complications related to bowel motility Outcome: Progressing Goal: Will not experience complications related to urinary retention Outcome: Progressing   Problem: Safety: Goal: Ability to remain free from injury will improve Outcome: Progressing   

## 2020-11-14 NOTE — Care Management Important Message (Signed)
Important Message  Patient Details  Name: OSEI ANGER MRN: [id number] Date of Birth: [birth year]   Medicare Important Message Given:  Yes     Oralia Rud Annalisia Ingber 11/14/2020, 3:05 PM
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
## 2020-11-14 NOTE — Plan of Care (Signed)
  Problem: Health Behavior/Discharge Planning: Goal: Ability to manage health-related needs will improve Outcome: Progressing   Problem: Clinical Measurements: Goal: Ability to maintain clinical measurements within normal limits will improve Outcome: Progressing Goal: Diagnostic test results will improve Outcome: Progressing   

## 2020-11-14 NOTE — Progress Notes (Signed)
Inpatient Rehabilitation-Admissions Coordinator   Continued discussions with [facility] and pt's sister regarding care at DC. Feel pt will have the appropriate amount of assistance at DC from CIR (Min A + continued PEG use). Will proceed with CIR for post acute rehab. Will follow up tomorrow for possible admit, pending medical readiness and bed availability. Dr. Allena Katz and [facility] team aware.   Cheri Rous, OTR/L  Rehab Admissions Coordinator  [phone] 11/14/2020 12:50 PM

## 2020-11-14 NOTE — Progress Notes (Signed)
SLP Cancellation Note  Patient Details Name: [name] MRN: [id number] DOB: 11/15/1956   Cancelled treatment:        Pt is not medically appropriate for PO trials at this time.  SLP will monitor remotely for readiness for further swallow evaluation.  Feel free to place new consult when pt is ready for PO trials and/or instrumental swallow evaluation.   Kerrie Pleasure, MA, CCC-SLP Acute Rehabilitation Services Office: [phone] 11/14/2020, 10:23 AM

## 2020-11-14 NOTE — Progress Notes (Signed)
36M and 2W and security office all called regarding pt belongings (clothing/dentures), and they could not be located. Per pt sister belongings were not brought to 5N with pt upon transfer, last seen on 36M. Will continue to search for belongings and monitor pt.

## 2020-11-14 NOTE — Progress Notes (Signed)
PROGRESS NOTE    [name]  IWL:[id number] DOB: 1956/02/24 DOA: 10/20/2020  PCP: [place]    Brief Narrative:  [name] is a 64 year old male with past medical history significant for ischemic CVA with left-sided deficits/dysphagia, Barrett's esophagus, GERD, gastroparesis, essential hypertension, hyperlipidemia, iron deficiency anemia, intellectual deficiency, schizophrenia, seizure disorder, hypothyroidism who presented to the ED via EMS from his group home for evaluation of fall and syncopal episode.  Per staff at group home, patient's fall unwitnessed and when trying to assist him to his feet he had a syncopal episode.  He was noted to be pale diaphoretic by EMS with tachycardia with HR 120-130.  SPO2 was noted to be 88% on room air.  CBG 255.  In the ED, afebrile with persistent tachycardia and tachypnea.  Requiring 3 L of supplemental oxygen.  Sodium 136, potassium 4.5, chloride 104, bicarb 19, anion gap 13, BUN 37, creatinine 1.7 with baseline 1.2, glucose 163, SARS-CoV-2 PCR negative.  Influenzinum panel negative.  D-dimer 0.62.  Procalcitonin 0.11, lactic acid 1.5.  Chest x-ray with no acute infiltrate but does note marked severely gastric and esophageal distention.  CT angiogram chest negative for PE.  CT abdomen/pelvis with large, stable gastric hernia with associated esophageal dilation and subsequent mass-effect on the trachea.  EDP discussed case with cardiothoracic surgery, Dr. Kipp Brood will consult.  Admitted to hospitalist service.  Patient was initially admitted to the hospital service on 10/21/2020, patient underwent EGD on 10/21/2020.  Early morning on 10/22/2020, rapid response as patient developed significant respiratory distress and was subsequently intubated and transferred to ICU under the critical care service.  Patient underwent hiatal hernia repair with G-tube placement by cardiothoracic surgery, Dr. Kipp Brood on 10/26/2020.   Patient was extubated on 11/05/2020.  Transferred back to hospital service on 11/06/2020.   Assessment & Plan: Acute hypoxic/hypercapnic respiratory failure, present on admission Aspiration vs ventilator associated pneumonia; Klebsiella PNA and Citrobacter Koseri Sepsis, not present on admission Patient presenting to ED from group home with syncopal episode and was noted to be in respiratory distress.  CT angiogram chest negative for PE and no evidence of acute infiltrate, pleural effusion or pneumothorax.  TTE with LVEF 17-40%, grade 1 diastolic dysfunction, no aortic stenosis, normal IVC.  Etiology likely secondary to his large hiatal hernia with compressive tracheal compression.  Rapid response 10/22/2020 with subsequent intubation for increased work of breathing.  During the initial hospitalization, patient was noted to develop pneumonia aspiration versus ventilator associated pneumonia and was treated with course of Unasyn followed by Bactrim in accordance with tracheal aspirate culture susceptibilities. --Currently oxygenating well on room air, occasionally will desaturate while sleeping; continue to monitor SPO2. --Cont recommendation.  --Chest xray done shows streaky basilar opacities and we will obtain cta chest.   Large hiatal hernia Barrett's esophagus GERD Gastroparesis CT abdomen/pelvis with large stable gastric hernia with associated esophageal dilation and subsequent mass-effect on the trachea, mild right upper lobe atelectasis.  Cardiothoracic surgery was consulted.  Patient underwent robotic assisted hiatal hernia repair by Dr. Kipp Brood on 10/26/2020 with PEG tube placement and IR placement of jejunal arm vent tube.  Postoperatively, course complicated by gastroparesis.  Initially requiring neostigmine and Reglan, now discontinued.  Tolerating tube feeds at goal. --Protonix 40 mg BID --Colace 139m BID --MiraLAX BID --zofran prn --Speech therapy consulted for reevaluation  given patient now extubated and bowel function seems to be improved and at goal with tube feeds. Pt is alert and awake and oriented  to person place and president. TF continued.  No change in TF regimen.     Hypothyroidism --Levothyroxine 75 mcg daily --no change .   Schizophrenia Intellectual delay Seizure disorder --Valproic acid 250 mg BID --Lamictal 100 mg daily --stable cont Lamictal and valproate.   Hyperlipidemia: Crestor 20 mg daily Cont Crestor no change.   Essential hypertension On benazepril 20 mg p.o. daily and propranolol 10 mg p.o. daily at home. --BP 110/66 this morning --Continue to hold home antihypertensives --Monitor BP closely daily. Blood pressure 111/78, pulse 90, temperature 98.8 F (37.1 C), temperature source Oral, resp. rate 17, height 5' 4"  (1.626 m), weight 69.3 kg, SpO2 97 %. cont benazepril and propranolol.    History of CVA with left-sided deficits and dysphagia CT head without contrast with no acute intracranial abnormality but does note large area encephalomalacia involving right frontal lobe. --Continue tube feeds --PT/OT  Weakness, debility, deconditioning: --continue PT/OT efforts --Likely needs SNF vs LTACH vs CIR --TOC for placement  DM II --a1c is 5.8 and we will continue current regimen of SSI, accucheck per glycemic protocol and  Follow.  DVT prophylaxis: Heparin Code Status: Full code Family Communication: No family present at bedside this morning; attempted to contact patients sister (POA) [name] via telephone this am, unsuccessful.  Disposition Plan:  Status is: Inpatient  Remains inpatient appropriate because:Unsafe d/c plan, IV treatments appropriate due to intensity of illness or inability to take PO and Inpatient level of care appropriate due to severity of illness   Dispo: The patient is from: Group home  Anticipated d/c is to: LTAC vs CIR vs SNF  Anticipated d/c date is: 2  days  Patient currently is not medically stable to d/c.  Need to ensure regular bowel movements and titration off of scheduled Reglan and neostigmine   Consultants:   PCCM - signed off 11/14  Cardiothoracic surgery  [facility]  Procedures:   Intubation 10/30; extubated 11/13  EGD, Dr. Carlean Purl 10/30  Left [place] CVL 10/30 - 11/13  Robotic assisted hiatal hernia repair 11/3, Dr. Kipp Brood  PEG 11/3, jejunal arm vent tube placement IR 11/15   Subjective/Overview: Pt is alert and oriented and denies any complaints of chest pain head or eye or neck or nose  throat or chest or abdomen.Pt is ready for discharge once placement is available.  Lasbs are stable and hb is stable.   Pt is alert, oriented and asking about his lunch.  Pt is awaiting placement and Denies any complaints.   Pt is resting and wakes up for me and lets me examine him. Chest xray most likely c/w atelectasis however due to pos d-dimer we will do cta chest as d-dimer is high but pt is poor anticoagulation risk due to his h/o GIB in past.  Called sister debbie and wants [place] to call her with results even if they are late in night. D/w her plan for his cta for f/u of his chest xray.    Objective: Vitals:   11/13/20 1947 11/14/20 0132 11/14/20 0500 11/14/20 0738  BP: 137/83 139/80  111/78  Pulse: 84 96  90  Resp: 16 16  17   Temp: 99.1 F (37.3 C) 98.2 F (36.8 C)  98.8 F (37.1 C)  TempSrc: Oral Oral  Oral  SpO2: 95% 96%  97%  Weight:   69.3 kg   Height:       SpO2: 97 % O2 Flow Rate (L/min): 4 L/min FiO2 (%): (S) 30 %  Intake/Output Summary (Last  24 hours) at 11/14/2020 0916 Last data filed at 11/14/2020 0500 Gross per 24 hour  Intake 8898.3 ml  Output 2680 ml  Net 6218.3 ml   Filed Weights   11/11/20 0413 11/13/20 0437 11/14/20 0500  Weight: 70.1 kg 69.2 kg 69.3 kg    Examination: Blood pressure 111/78, pulse 90, temperature 98.8 F (37.1 C), temperature source Oral, resp.  rate 17, height 5' 4"  (1.626 m), weight 69.3 kg, SpO2 97 %. General exam: Appears calm and comfortable  HEENT:EOMI, perrl  Respiratory system: Clear to auscultation. Respiratory effort normal. Cardiovascular system: S1 & S2 heard, RRR. No JVD, murmurs, rubs, gallops or clicks. No pedal edema. Gastrointestinal system: Abdomen is nondistended, soft and nontender. No organomegaly or masses felt. Normal bowel sounds heard. Central nervous system: Alert and oriented.CN grossly intact No focal neurological deficits. Extremities: pt moving all 4 ext / no edema Skin: No rashes, lesions or ulcers Psychiatry: Judgement and insight appear normal. Mood & affect appropriate.   Data Reviewed: I have personally reviewed following labs and imaging studies  Labs  No results for input(s): CKTOTAL, CKMB, TROPONINI in the last 72 hours. Lab Results  Component Value Date   WBC 6.3 11/14/2020   HGB 10.6 (L) 11/14/2020   HCT 34.5 (L) 11/14/2020   MCV 86.5 11/14/2020   PLT 350 11/14/2020    Recent Labs  Lab 11/13/20 0807 11/13/20 0807 11/14/20 0433  NA 134*   < > 134*  K 4.3   < > 4.4  CL 99   < > 97*  CO2 26   < > 25  BUN 13   < > 15  CREATININE 0.89   < > 0.93  CALCIUM 9.2   < > 9.3  PROT 7.8  --   --   BILITOT 0.4  --   --   ALKPHOS 101  --   --   ALT 15  --   --   AST 20  --   --   GLUCOSE 169*   < > 193*   < > = values in this interval not displayed.   Lab Results  Component Value Date   TRIG 173 (H) 10/31/2020   Lab Results  Component Value Date   DDIMER 4.63 (H) 11/13/2020   Invalid input(s): POCBNP  echocardiogram   Radiology Studies: CT ANGIO CHEST PE W OR WO CONTRAST  Result Date: 11/13/2020 CLINICAL DATA:  Respiratory failure. EXAM: CT ANGIOGRAPHY CHEST WITH CONTRAST TECHNIQUE: Multidetector CT imaging of the chest was performed using the standard protocol during bolus administration of intravenous contrast. Multiplanar CT image reconstructions and MIPs were obtained  to evaluate the vascular anatomy. CONTRAST:  123m OMNIPAQUE IOHEXOL 350 MG/ML SOLN COMPARISON:  10/05/2007 FINDINGS: Cardiovascular: Contrast injection is sufficient to demonstrate satisfactory opacification of the pulmonary arteries to the segmental level. There is no pulmonary embolus or evidence of right heart strain. The size of the main pulmonary artery is normal. Heart size is normal, with no pericardial effusion. The course and caliber of the aorta are normal. There is no atherosclerotic calcification. Opacification decreased due to pulmonary arterial phase contrast bolus timing. Mediastinum/Nodes: -- No mediastinal lymphadenopathy. -- No hilar lymphadenopathy. -- No axillary lymphadenopathy. --there are few mildly enlarged bilateral supraclavicular lymph nodes of unknown clinical significance. -- Normal thyroid gland where visualized. -the esophagus is patulous and dilated. The patient appears to be status post prior hiatal hernia repair. There is a residual small hiatal hernia. Lungs/Pleura: There is atelectasis at the  lung bases bilaterally with trace bilateral pleural effusions. There is mucous plugging and bronchial wall thickening primarily at the lung bases. There is right apical pleuroparenchymal scarring. There is no pneumothorax. Upper Abdomen: Contrast bolus timing is not optimized for evaluation of the abdominal organs. The stomach is distended. There is a partially visualized gastrojejunostomy tube in place. Musculoskeletal: No chest wall abnormality. No bony spinal canal stenosis. Review of the MIP images confirms the above findings. IMPRESSION: 1. There is no evidence for acute pulmonary embolus or aortic dissection. 2. There is atelectasis at the lung bases bilaterally with trace bilateral pleural effusions. 3. There is mucous plugging and bronchial wall thickening primarily at the lung bases. Findings may be secondary to infectious or reactive bronchiolitis. 4. The esophagus is patulous and  dilated. The patient appears to be status post prior hiatal hernia repair. There is a residual small hiatal hernia. 5. The stomach is distended. There is a partially visualized gastrojejunostomy tube in place. 6. Mildly enlarged bilateral supraclavicular lymph nodes of unknown clinical significance. Electronically Signed   By: Constance Holster M.D.   On: 11/13/2020 19:57   DG Chest Port 1 View  Result Date: 11/12/2020 CLINICAL DATA:  Loss of consciousness, cough EXAM: PORTABLE CHEST 1 VIEW COMPARISON:  Radiograph 11/04/2020 FINDINGS: Removal of a previously seen endotracheal tube. Partial visualization of a percutaneous gastrojejunostomy tube in the upper abdomen. Telemetry leads overlie the chest. Lung volumes are low with streaky opacities favoring atelectatic changes and vascular crowding. No consolidative opacity is seen. No pneumothorax or visible effusion. The cardiomediastinal contours are unremarkable. No acute osseous or soft tissue abnormality. IMPRESSION: Low lung volumes with streaky opacities basilar opacities. Aspiration/airspace disease is not fully excluded. Electronically Signed   By: Lovena Le M.D.   On: 11/12/2020 19:48      Current Facility-Administered Medications (Endocrine & Metabolic):  .  insulin aspart (novoLOG) injection 0-15 Units .  levothyroxine (SYNTHROID) tablet 75 mcg   Current Facility-Administered Medications (Cardiovascular):  .  rosuvastatin (CRESTOR) tablet 20 mg   Current Facility-Administered Medications (Respiratory):  .  albuterol (PROVENTIL) (2.5 MG/3ML) 0.083% nebulizer solution 2.5 mg   Current Facility-Administered Medications (Analgesics):  .  acetaminophen (TYLENOL) suppository 650 mg .  oxyCODONE (ROXICODONE) 5 MG/5ML solution 7.5 mg   Current Facility-Administered Medications (Hematological):  .  heparin injection 5,000 Units   Current Facility-Administered Medications (Other):  .  0.9 %  sodium chloride infusion .  bacitracin  ointment .  chlorhexidine gluconate (MEDLINE KIT) (PERIDEX) 0.12 % solution 15 mL .  Chlorhexidine Gluconate Cloth 2 % PADS 6 each .  docusate (COLACE) 50 MG/5ML liquid 100 mg .  feeding supplement (OSMOLITE 1.2 CAL) liquid 1,000 mL .  free water 74 mL .  influenza vac split quadrivalent PF (FLUARIX) injection 0.5 mL .  lamoTRIgine (LAMICTAL) tablet 100 mg .  lip balm (CARMEX) ointment .  LORazepam (ATIVAN) injection 1 mg .  ondansetron (ZOFRAN) injection 4 mg .  pantoprazole sodium (PROTONIX) 40 mg/20 mL oral suspension 40 mg .  polyethylene glycol (MIRALAX / GLYCOLAX) packet 17 g .  QUEtiapine (SEROQUEL) tablet 400 mg .  sodium chloride flush (NS) 0.9 % injection 10-40 mL .  sodium chloride flush (NS) 0.9 % injection 10-40 mL .  valproic acid (DEPAKENE) 250 MG/5ML solution 250 mg  No current outpatient medications on file.  Anti-infectives (From admission, onward)   Start     Dose/Rate Route Frequency Ordered Stop   11/02/20 2200  sulfamethoxazole-trimethoprim (BACTRIM) 200-40  MG/5ML suspension 20 mL        20 mL Per Tube Every 12 hours 11/02/20 1534 11/07/20 0957   11/02/20 1115  sulfamethoxazole-trimethoprim (BACTRIM) 200-40 MG/5ML suspension 20 mL  Status:  Discontinued        20 mL Oral Every 12 hours 11/02/20 1020 11/02/20 1534   10/21/20 0500  Ampicillin-Sulbactam (UNASYN) 3 g in sodium chloride 0.9 % 100 mL IVPB  Status:  Discontinued        3 g 200 mL/hr over 30 Minutes Intravenous Every 6 hours 10/21/20 0430 10/24/20 0901       Continuous Infusions: . sodium chloride 10 mL/hr at 11/02/20 0600  . feeding supplement (OSMOLITE 1.2 CAL) 75 mL/hr at 11/14/20 3979     LOS: 24 days    Para Skeans, MD [facility] Pager [phone] How to contact the [facility] Attending or Consulting provider [place] or covering provider during after hours [place], for this patient?    1. Check the care team in [facility] and look for a) attending/consulting [facility] provider listed and b) the  [facility] team listed 2. Log into [email or web address] and use [place]'s universal password to access. If you do not have the password, please contact the hospital operator. 3. Locate the [facility] provider you are looking for under [facility] and page to a number that you can be directly reached. 4. If you still have difficulty reaching the provider, please page the [facility] (Director on Call) for the Hospitalists listed on amion for assistance. [email or web address] Password [facility] 11/14/2020, 9:16 AM

## 2020-11-15 ENCOUNTER — Encounter (HOSPITAL_COMMUNITY): Payer: Self-pay | Admitting: Physical Medicine & Rehabilitation

## 2020-11-15 ENCOUNTER — Other Ambulatory Visit: Payer: Self-pay

## 2020-11-15 ENCOUNTER — Inpatient Hospital Stay (HOSPITAL_COMMUNITY)
Admission: RE | Admit: 2020-11-15 | Discharge: 2020-12-07 | DRG: 945 | Disposition: A | Discharge status: Home / Self Care | Payer: Medicare Other | Source: Intra-hospital | Attending: Physical Medicine & Rehabilitation | Admitting: Physical Medicine & Rehabilitation

## 2020-11-15 DIAGNOSIS — Z20822 Contact with and (suspected) exposure to covid-19: Secondary | ICD-10-CM | POA: Diagnosis present

## 2020-11-15 DIAGNOSIS — E785 Hyperlipidemia, unspecified: Secondary | ICD-10-CM | POA: Diagnosis present

## 2020-11-15 DIAGNOSIS — I10 Essential (primary) hypertension: Secondary | ICD-10-CM | POA: Diagnosis present

## 2020-11-15 DIAGNOSIS — G40909 Epilepsy, unspecified, not intractable, without status epilepticus: Secondary | ICD-10-CM | POA: Diagnosis present

## 2020-11-15 DIAGNOSIS — E039 Hypothyroidism, unspecified: Secondary | ICD-10-CM | POA: Diagnosis present

## 2020-11-15 DIAGNOSIS — K449 Diaphragmatic hernia without obstruction or gangrene: Secondary | ICD-10-CM | POA: Diagnosis present

## 2020-11-15 DIAGNOSIS — K2289 Other specified disease of esophagus: Secondary | ICD-10-CM | POA: Diagnosis not present

## 2020-11-15 DIAGNOSIS — F209 Schizophrenia, unspecified: Secondary | ICD-10-CM | POA: Diagnosis present

## 2020-11-15 DIAGNOSIS — K3184 Gastroparesis: Secondary | ICD-10-CM | POA: Diagnosis present

## 2020-11-15 DIAGNOSIS — R1310 Dysphagia, unspecified: Secondary | ICD-10-CM

## 2020-11-15 DIAGNOSIS — E1165 Type 2 diabetes mellitus with hyperglycemia: Secondary | ICD-10-CM | POA: Diagnosis present

## 2020-11-15 DIAGNOSIS — I69354 Hemiplegia and hemiparesis following cerebral infarction affecting left non-dominant side: Secondary | ICD-10-CM | POA: Diagnosis not present

## 2020-11-15 DIAGNOSIS — R531 Weakness: Secondary | ICD-10-CM | POA: Diagnosis present

## 2020-11-15 DIAGNOSIS — D509 Iron deficiency anemia, unspecified: Secondary | ICD-10-CM | POA: Diagnosis present

## 2020-11-15 DIAGNOSIS — F79 Unspecified intellectual disabilities: Secondary | ICD-10-CM | POA: Diagnosis present

## 2020-11-15 DIAGNOSIS — R000 Tachycardia, unspecified: Secondary | ICD-10-CM | POA: Diagnosis not present

## 2020-11-15 DIAGNOSIS — R9431 Abnormal electrocardiogram [ECG] [EKG]: Secondary | ICD-10-CM | POA: Diagnosis not present

## 2020-11-15 DIAGNOSIS — K2270 Barrett's esophagus without dysplasia: Secondary | ICD-10-CM | POA: Diagnosis present

## 2020-11-15 DIAGNOSIS — E1143 Type 2 diabetes mellitus with diabetic autonomic (poly)neuropathy: Secondary | ICD-10-CM | POA: Diagnosis present

## 2020-11-15 DIAGNOSIS — R569 Unspecified convulsions: Secondary | ICD-10-CM

## 2020-11-15 DIAGNOSIS — Z79899 Other long term (current) drug therapy: Secondary | ICD-10-CM

## 2020-11-15 DIAGNOSIS — K219 Gastro-esophageal reflux disease without esophagitis: Secondary | ICD-10-CM | POA: Diagnosis present

## 2020-11-15 DIAGNOSIS — Z931 Gastrostomy status: Secondary | ICD-10-CM | POA: Diagnosis not present

## 2020-11-15 DIAGNOSIS — E7800 Pure hypercholesterolemia, unspecified: Secondary | ICD-10-CM | POA: Diagnosis present

## 2020-11-15 DIAGNOSIS — R5381 Other malaise: Principal | ICD-10-CM | POA: Diagnosis present

## 2020-11-15 DIAGNOSIS — R1319 Other dysphagia: Secondary | ICD-10-CM | POA: Diagnosis not present

## 2020-11-15 DIAGNOSIS — F201 Disorganized schizophrenia: Secondary | ICD-10-CM | POA: Diagnosis not present

## 2020-11-15 DIAGNOSIS — E871 Hypo-osmolality and hyponatremia: Secondary | ICD-10-CM | POA: Diagnosis present

## 2020-11-15 DIAGNOSIS — K224 Dyskinesia of esophagus: Secondary | ICD-10-CM

## 2020-11-15 LAB — GLUCOSE, CAPILLARY
Glucose-Capillary: 135 mg/dL — ABNORMAL HIGH (ref 70–99)
Glucose-Capillary: 137 mg/dL — ABNORMAL HIGH (ref 70–99)
Glucose-Capillary: 142 mg/dL — ABNORMAL HIGH (ref 70–99)
Glucose-Capillary: 154 mg/dL — ABNORMAL HIGH (ref 70–99)
Glucose-Capillary: 174 mg/dL — ABNORMAL HIGH (ref 70–99)
Glucose-Capillary: 189 mg/dL — ABNORMAL HIGH (ref 70–99)

## 2020-11-15 MED ORDER — HEPARIN SODIUM (PORCINE) 5000 UNIT/ML IJ SOLN: 5000.0000 [IU] | Freq: Three times a day (TID) | INTRAMUSCULAR | Status: DC

## 2020-11-15 MED ORDER — ACETAMINOPHEN 650 MG RE SUPP: 650.0000 mg | RECTAL | Status: DC | PRN

## 2020-11-15 MED ORDER — ROSUVASTATIN CALCIUM 20 MG PO TABS
20.0000 mg | ORAL_TABLET | Freq: Every day | ORAL | Status: DC
  Administered 2020-11-16 – 2020-11-25 (×10): 20 mg
  Filled 2020-11-15 (×10): qty 1

## 2020-11-15 MED ORDER — ORAL CARE MOUTH RINSE
15.0000 mL | Freq: Two times a day (BID) | OROMUCOSAL | Status: DC
  Administered 2020-11-15 – 2020-12-07 (×42): 15 mL via OROMUCOSAL

## 2020-11-15 MED ORDER — VALPROIC ACID 250 MG/5ML PO SOLN
250.0000 mg | Freq: Two times a day (BID) | ORAL | Status: DC
  Administered 2020-11-15 – 2020-11-25 (×20): 250 mg
  Filled 2020-11-15 (×20): qty 5

## 2020-11-15 MED ORDER — ROSUVASTATIN CALCIUM 20 MG PO TABS: 20.0000 mg | ORAL_TABLET | Freq: Every day | ORAL | 0 refills | Status: DC

## 2020-11-15 MED ORDER — LAMOTRIGINE 100 MG PO TABS: 100.0000 mg | ORAL_TABLET | Freq: Every day | ORAL | 0 refills | Status: DC

## 2020-11-15 MED ORDER — BACITRACIN ZINC 500 UNIT/GM EX OINT
TOPICAL_OINTMENT | Freq: Every day | CUTANEOUS | 0 refills | Status: DC
Start: 2020-11-16 — End: 2020-12-07

## 2020-11-15 MED ORDER — OSMOLITE 1.2 CAL PO LIQD
1000.0000 mL | ORAL | Status: DC
  Administered 2020-11-15 (×2): 1000 mL
  Filled 2020-11-15: qty 1000

## 2020-11-15 MED ORDER — INSULIN ASPART 100 UNIT/ML ~~LOC~~ SOLN
0.0000 [IU] | SUBCUTANEOUS | Status: DC
  Administered 2020-11-15: 2 [IU] via SUBCUTANEOUS
  Administered 2020-11-15 – 2020-11-16 (×4): 3 [IU] via SUBCUTANEOUS
  Administered 2020-11-16: 2 [IU] via SUBCUTANEOUS
  Administered 2020-11-16 – 2020-11-17 (×2): 3 [IU] via SUBCUTANEOUS
  Administered 2020-11-17 (×2): 2 [IU] via SUBCUTANEOUS
  Administered 2020-11-17: 3 [IU] via SUBCUTANEOUS
  Administered 2020-11-18 (×3): 2 [IU] via SUBCUTANEOUS
  Administered 2020-11-18 (×3): 3 [IU] via SUBCUTANEOUS
  Administered 2020-11-19 (×3): 2 [IU] via SUBCUTANEOUS
  Administered 2020-11-19 – 2020-11-20 (×2): 3 [IU] via SUBCUTANEOUS
  Administered 2020-11-20: 2 [IU] via SUBCUTANEOUS
  Administered 2020-11-20 (×3): 3 [IU] via SUBCUTANEOUS
  Administered 2020-11-21: 2 [IU] via SUBCUTANEOUS
  Administered 2020-11-21: 3 [IU] via SUBCUTANEOUS
  Administered 2020-11-21: 2 [IU] via SUBCUTANEOUS
  Administered 2020-11-21: 3 [IU] via SUBCUTANEOUS
  Administered 2020-11-21 (×2): 2 [IU] via SUBCUTANEOUS
  Administered 2020-11-22 (×3): 3 [IU] via SUBCUTANEOUS
  Administered 2020-11-22 (×2): 2 [IU] via SUBCUTANEOUS
  Administered 2020-11-23 (×3): 3 [IU] via SUBCUTANEOUS
  Administered 2020-11-23: 2 [IU] via SUBCUTANEOUS
  Administered 2020-11-24 (×2): 3 [IU] via SUBCUTANEOUS
  Administered 2020-11-24: 2 [IU] via SUBCUTANEOUS
  Administered 2020-11-24 – 2020-11-25 (×2): 3 [IU] via SUBCUTANEOUS

## 2020-11-15 MED ORDER — AZITHROMYCIN 200 MG/5ML PO SUSR
500.0000 mg | Freq: Every day | ORAL | Status: AC
  Administered 2020-11-16 – 2020-11-18 (×3): 500 mg
  Filled 2020-11-15: qty 12.5
  Filled 2020-11-15: qty 15
  Filled 2020-11-15: qty 12.5

## 2020-11-15 MED ORDER — PANTOPRAZOLE SODIUM 40 MG PO PACK
40.0000 mg | PACK | Freq: Two times a day (BID) | ORAL | Status: DC
  Administered 2020-11-15 – 2020-11-23 (×16): 40 mg
  Filled 2020-11-15 (×14): qty 20

## 2020-11-15 MED ORDER — LIP MEDEX EX OINT
1.0000 "application " | TOPICAL_OINTMENT | CUTANEOUS | Status: DC | PRN
  Filled 2020-11-15: qty 7

## 2020-11-15 MED ORDER — QUETIAPINE FUMARATE 200 MG PO TABS
400.0000 mg | ORAL_TABLET | Freq: Two times a day (BID) | ORAL | Status: DC
  Administered 2020-11-15 – 2020-11-25 (×20): 400 mg
  Filled 2020-11-15 (×20): qty 2

## 2020-11-15 MED ORDER — HEPARIN SODIUM (PORCINE) 5000 UNIT/ML IJ SOLN
5000.0000 [IU] | Freq: Three times a day (TID) | INTRAMUSCULAR | Status: DC
  Administered 2020-11-15 – 2020-12-07 (×66): 5000 [IU] via SUBCUTANEOUS
  Filled 2020-11-15 (×66): qty 1

## 2020-11-15 MED ORDER — ALBUTEROL SULFATE (2.5 MG/3ML) 0.083% IN NEBU: 2.5000 mg | INHALATION_SOLUTION | RESPIRATORY_TRACT | Status: DC | PRN

## 2020-11-15 MED ORDER — OSMOLITE 1.2 CAL PO LIQD
1000.0000 mL | ORAL | 0 refills | Status: DC
Start: 2020-11-15 — End: 2020-12-07

## 2020-11-15 MED ORDER — ALBUTEROL SULFATE (2.5 MG/3ML) 0.083% IN NEBU: 2.5000 mg | INHALATION_SOLUTION | RESPIRATORY_TRACT | 12 refills | Status: DC | PRN

## 2020-11-15 MED ORDER — LEVOTHYROXINE SODIUM 75 MCG PO TABS
75.0000 ug | ORAL_TABLET | Freq: Every day | ORAL | Status: DC
  Administered 2020-11-16 – 2020-11-25 (×11): 75 ug
  Filled 2020-11-15 (×12): qty 1

## 2020-11-15 MED ORDER — DOCUSATE SODIUM 50 MG/5ML PO LIQD
100.0000 mg | Freq: Two times a day (BID) | ORAL | Status: DC
  Administered 2020-11-15 – 2020-11-25 (×18): 100 mg
  Filled 2020-11-15 (×18): qty 10

## 2020-11-15 MED ORDER — LORAZEPAM 2 MG/ML IJ SOLN: 1.0000 mg | Freq: Four times a day (QID) | INTRAMUSCULAR | 0 refills | Status: DC | PRN

## 2020-11-15 MED ORDER — ACETAMINOPHEN 650 MG RE SUPP: 650.0000 mg | RECTAL | 0 refills | Status: DC | PRN

## 2020-11-15 MED ORDER — OXYCODONE HCL 5 MG/5ML PO SOLN: 7.5000 mg | Freq: Four times a day (QID) | ORAL | Status: DC | PRN

## 2020-11-15 MED ORDER — CHLORHEXIDINE GLUCONATE 0.12% ORAL RINSE (MEDLINE KIT)
15.0000 mL | Freq: Two times a day (BID) | OROMUCOSAL | 0 refills | Status: DC
Start: 2020-11-15 — End: 2020-12-07

## 2020-11-15 MED ORDER — OXYCODONE HCL 5 MG/5ML PO SOLN: 7.5000 mg | Freq: Four times a day (QID) | ORAL | 0 refills | Status: DC | PRN

## 2020-11-15 MED ORDER — DOCUSATE SODIUM 50 MG/5ML PO LIQD: 100.0000 mg | Freq: Two times a day (BID) | ORAL | 0 refills | Status: DC

## 2020-11-15 MED ORDER — FREE WATER
75.0000 mL | Status: DC
  Administered 2020-11-15 – 2020-11-23 (×47): 75 mL

## 2020-11-15 MED ORDER — QUETIAPINE FUMARATE 400 MG PO TABS: 400.0000 mg | ORAL_TABLET | Freq: Two times a day (BID) | ORAL | 0 refills | Status: DC

## 2020-11-15 MED ORDER — FREE WATER: 75.0000 mL | 0 refills | Status: DC

## 2020-11-15 MED ORDER — LIP MEDEX EX OINT: TOPICAL_OINTMENT | CUTANEOUS | 0 refills | Status: DC | PRN

## 2020-11-15 MED ORDER — LEVOTHYROXINE SODIUM 75 MCG PO TABS: 75.0000 ug | ORAL_TABLET | Freq: Every day | ORAL | 0 refills | Status: DC

## 2020-11-15 MED ORDER — LAMOTRIGINE 100 MG PO TABS
100.0000 mg | ORAL_TABLET | Freq: Every day | ORAL | Status: DC
  Administered 2020-11-16 – 2020-11-25 (×10): 100 mg
  Filled 2020-11-15 (×10): qty 1

## 2020-11-15 MED ORDER — AZITHROMYCIN 200 MG/5ML PO SUSR: 500.0000 mg | Freq: Every day | ORAL | 0 refills | Status: DC

## 2020-11-15 MED ORDER — POLYETHYLENE GLYCOL 3350 17 G PO PACK
17.0000 g | PACK | Freq: Two times a day (BID) | ORAL | Status: DC
  Administered 2020-11-15 – 2020-11-25 (×17): 17 g
  Filled 2020-11-15 (×19): qty 1

## 2020-11-15 MED ORDER — FREE WATER
75.0000 mL | Status: DC
  Administered 2020-11-15 (×2): 75 mL

## 2020-11-15 MED ORDER — BACITRACIN ZINC 500 UNIT/GM EX OINT
1.0000 "application " | TOPICAL_OINTMENT | Freq: Every day | CUTANEOUS | Status: DC
  Administered 2020-11-16 – 2020-12-07 (×20): 1 via TOPICAL
  Filled 2020-11-15 (×6): qty 28.35

## 2020-11-15 MED ORDER — PANTOPRAZOLE SODIUM 40 MG PO PACK: 40.0000 mg | PACK | Freq: Two times a day (BID) | ORAL | 0 refills | Status: DC

## 2020-11-15 MED ORDER — VALPROIC ACID 250 MG/5ML PO SOLN: 250.0000 mg | Freq: Two times a day (BID) | ORAL | 0 refills | Status: DC

## 2020-11-15 NOTE — Progress Notes (Signed)
Physical Therapy Treatment Patient Details Name: [name] MRN: [id number] DOB: 1956/01/08 Today's Date: 11/15/2020    History of Present Illness 64 y.o male presented 10/21/20 with syncope and fall at his group home. Workup showed large hiatal hernia compressing trachea. +aspiration pnemonia; intubated 10/30; 11/03 hiatal hernia repair with PEG placement; extubated 11/12; encephalopathy; scrotal edema  PMH includes GERD, Barrett esophagus, gastroparesis, HTN, stroke with L sided deficits, craniotomy for brain cyst, schizophrenia, intellectual disability.    PT Comments    Pt supine on arrival, sleeping but easily awoken and agreeable to therapy session with encouragement. Primary session focus on progressing standing tolerance and seated/standing BLE therapeutic exercises. Pt performed exercises as detailed below with fair tolerance, needing increased tactile cues for LLE technique. Of note, pt with very limited standing tolerance and unable to progress gait distance >46ft despite chair follow, max encouragement and +70minA with RW, pt very impulsive to sit after taking 1-2 steps. Pt continues to benefit from PT services to progress toward functional mobility goals. D/C recs below remain appropriate.  Follow Up Recommendations  CIR;Supervision/Assistance - 24 hour     Equipment Recommendations  Other (comment) (TBD next venue)    Recommendations for Other Services Rehab consult     Precautions / Restrictions Precautions Precautions: Fall Precaution Comments: Lt hemiparesis, PEG Restrictions Weight Bearing Restrictions: No    Mobility  Bed Mobility Overal bed mobility: Needs Assistance Bed Mobility: Supine to Sit     Supine to sit: Min guard;HOB elevated     General bed mobility comments: min guard for safety with lines due to pt quick movements  Transfers Overall transfer level: Needs assistance Equipment used: Rolling walker (2 wheeled) Transfers: Sit to/from  [employer] Sit to Stand: Min assist;+2 physical assistance;+2 safety/equipment Stand pivot transfers: Min assist;+2 physical assistance;+2 safety/equipment       General transfer comment: +39minA for safety in standing with RW (hand over hand to place L UE on RW appropriately). +70minA for pivot to [facility] and then recliner chair using RW cues for safe sequencing throughout due to impulsivity. Pt demo ability to take a few steps forward but with premature sitting noted due to pt reported fatigue  Ambulation/Gait Ambulation/Gait assistance: Min assist;+2 physical assistance Gait Distance (Feet): 4 Feet Assistive device: Rolling walker (2 wheeled) Gait Pattern/deviations: Step-to pattern;Decreased step length - left;Decreased weight shift to left;Shuffle Gait velocity: decreased   General Gait Details: BSC>chair, needs heavy vcs and assist to manage RW, pt impulsive to sit prior to reaching chair   Stairs             Wheelchair Mobility    Modified Rankin (Stroke Patients Only)       Balance Overall balance assessment: Needs assistance Sitting-balance support: Feet supported;No upper extremity supported Sitting balance-Leahy Scale: Fair     Standing balance support: Bilateral upper extremity supported;During functional activity Standing balance-Leahy Scale: Poor Standing balance comment: UE and external support                            Cognition Arousal/Alertness: Awake/alert Behavior During Therapy: Flat affect;Impulsive Overall Cognitive Status: History of cognitive impairments - at baseline  Pt states "I like to walk" when asked for what pt likes to do at group home, but unable to progress gait this session.  General Comments: hx of intellectual disability, pt impulsive and requires frequent cues for safety      Exercises General Exercises - Lower Extremity Ankle Circles/Pumps: AROM;Right;10  reps;Seated Long Arc Quad: AROM;Strengthening;Both;10 reps;Seated Hip Flexion/Marching: AROM;Strengthening;Both;10 reps;Seated Other Exercises Other Exercises: STS x 5 reps (with rest breaks) for BLE strengthening    General Comments General comments (skin integrity, edema, etc.): HR 100-121 bpm during mobility tasks, SpO2 94-98% on RA (R hand), poor pleth signal once resting with L hand      Pertinent Vitals/Pain Pain Assessment: No/denies pain    Home Living                      Prior Function            PT Goals (current goals can now be found in the care plan section) Acute Rehab PT Goals Patient Stated Goal: to get back to normal per sister  PT Goal Formulation: Patient unable to participate in goal setting Time For Goal Achievement: 11/20/20 Potential to Achieve Goals: Fair Progress towards PT goals: Progressing toward goals    Frequency    Min 3X/week      PT Plan Current plan remains appropriate    Co-evaluation PT/OT/SLP Co-Evaluation/Treatment: Yes Reason for Co-Treatment: Complexity of the patient's impairments (multi-system involvement);Necessary to address cognition/behavior during functional activity;For patient/therapist safety;To address functional/ADL transfers PT goals addressed during session: Mobility/safety with mobility;Balance;Proper use of DME;Strengthening/ROM OT goals addressed during session: ADL's and self-care      AM-PAC PT "6 Clicks" Mobility   Outcome Measure  Help needed turning from your back to your side while in a flat bed without using bedrails?: None Help needed moving from lying on your back to sitting on the side of a flat bed without using bedrails?: A Little Help needed moving to and from a bed to a chair (including a wheelchair)?: A Lot Help needed standing up from a chair using your arms (e.g., wheelchair or bedside chair)?: A Little Help needed to walk in hospital room?: A Lot Help needed climbing 3-5 steps  with a railing? : Total 6 Click Score: 15    End of Session Equipment Utilized During Treatment: Gait belt Activity Tolerance: Patient tolerated treatment well Patient left: in chair;with call bell/phone within reach;with chair alarm set Nurse Communication: Mobility status PT Visit Diagnosis: Other abnormalities of gait and mobility (R26.89);Muscle weakness (generalized) (M62.81)     Time: 9024-0973 PT Time Calculation (min) (ACUTE ONLY): 23 min  Charges:  $Therapeutic Exercise: 8-22 mins                     Elick Aguilera P., PTA Acute Rehabilitation Services Pager: [phone] Office: [phone]   [place] 11/15/2020, 11:47 AM

## 2020-11-15 NOTE — Progress Notes (Signed)
Inpatient Rehabilitation-Admissions Coordinator   Notified pt and his sister of bed offer today; they have accepted. Received medical clearance from Dr. Allena Katz for admit to CIR today. Reviewed insurance letter and consent forms with pt's sister. All questions answered. RN and TOC updated on plan for admit.   Please call if questions.   Cheri Rous, OTR/L  Rehab Admissions Coordinator  [phone] 11/15/2020 11:02 AM

## 2020-11-15 NOTE — Progress Notes (Signed)
Patient ID: [name], male   DOB: 08/10/1956, 64 y.o.   MRN: [id number] Admit to unit, reviewed medications, Plan of care, and schedule. Vedia Pereyra

## 2020-11-15 NOTE — Progress Notes (Signed)
  Speech Language Pathology Treatment: Dysphagia  Patient Details Name: [name] MRN: [id number] DOB: [birth year] Today's Date: 11/15/2020 Time: 4081-4481 SLP Time Calculation (min) (ACUTE ONLY): 11 min  Assessment / Plan / Recommendation Clinical Impression  Prior to today's treatment session, SLP spoke with pt's hospitalist Allena Katz) and cardiothoracic surgeon Cliffton Asters) both of whom cleared pt for trials of POs with SLP to determine readiness to resume PO diet.  Pt demonstrated no overt s/s of aspiration with purees or thin liquids, nor did he exhibit significant s/s of esophageal dysphagia (belching, complaints of globus sensation).   However, given his recent surgical history and post-op course I would strongly recommend an esophagram or other study of esophageal function prior to initiating a PO diet.  Discussed recommendations with pt's sister over the phone and medical team via secure chat.  SLP will continue to follow along for readiness to initiate PO diet per results of further instrumental assessment.    HPI HPI: Pt is a 64 yo male presenting from his group home for evaluation of fall and syncopal episode. CXR was clear of infiltrates but CT abdomen/pelvis with large, stable gastric hernia with associated esophageal dilation and subsequent mass-effect on the trachea. Pt developed respiratory distress 10/30 requiring intubation. He is s/p [facility] repair with g-tube placement on 11/3; post-op course complicated by gastroparesis. Extubated 11/13. PMH also includes: Barrett's esophagus, GERD, HTN, HOLD, schizophrenia, intellectual delay, CVA with L-sided deficits and dysphagia (MBS in 2008 revealed a mild oropharyngeal dysphagia with recommendations for Dys 3 diet and thin liquids)      SLP Plan  Continue with current plan of care       Recommendations  Diet recommendations: NPO Medication Administration: Via alternative means                Oral Care Recommendations: Oral  care QID Follow up Recommendations: Inpatient Rehab SLP Visit Diagnosis: Dysphagia, pharyngoesophageal phase (R13.14) Plan: Continue with current plan of care       GO                Terek Bee, Melanee Spry 11/15/2020, 2:08 PM

## 2020-11-15 NOTE — Progress Notes (Signed)
Inpatient Rehabilitation Medication Review by a Pharmacist  A complete drug regimen review was completed for this patient to identify any potential clinically significant medication issues.  Clinically significant medication issues were identified:  Yes   Type of Medication Issue Identified Description of Issue Urgent (address now) Non-Urgent (address on AM team rounds) Plan Plan Accepted by Provider? (Yes / No / Pending AM Rounds)  Drug Interaction(s) (clinically significant)       Duplicate Therapy       Allergy       No Medication Administration End Date       Incorrect Dose       Additional Drug Therapy Needed       Other  The following medications were listed on discharge med list, but not ordered on admission to CIR:  Chlorhexidine mouth rinse (rec'd at [facility]) Lorazepam injection PRN (rec'd at [facility]) Vitamin D tablet (did not receive at [facility]) Non urgent Send secure chat to CIR provider Pending review on AM rounds    Name of provider notified for urgent issues identified:  N/A  For non-urgent medication issues to be resolved on team rounds tomorrow morning a [facility] Secure Chat Handoff was sent to:  Harvel Ricks, PA  Time spent performing this drug regimen review (minutes):  15  Vicki Mallet, PharmD, BCPS, [facility] Clinical Pharmacist 11/15/2020 3:34 PM
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
## 2020-11-15 NOTE — Progress Notes (Signed)
Occupational Therapy Treatment Patient Details Name: [name] MRN: [id number] DOB: 1956/02/02 Today's Date: 11/15/2020    History of present illness 64 y.o male presented 10/21/20 with syncope and fall at his group home. Workup showed large hiatal hernia compressing trachea. +aspiration pnemonia; intubated 10/30; 11/03 hiatal hernia repair with PEG placement; extubated 11/12; encephalopathy; scrotal edema  PMH includes GERD, Barrett esophagus, gastroparesis, HTN, stroke with L sided deficits, craniotomy for brain cyst, schizophrenia, intellectual disability.   OT comments  Pt progressing towards OT goals gradually, continues to benefit from consistent safety cues. Pt overall Min A x 2 for safe transfers using RW with continued impulsivity and quick movements Pt able to demonstrate LB dressing at Mod A using figure four position. Pt able to demonstrate functional mobility a few steps forward but noted to fatigue and prematurely sit during this task. VSS on RA. Continue to recommend postacute rehab to maximize safety and independence with daily tasks.    Follow Up Recommendations  CIR;Supervision/Assistance - 24 hour    Equipment Recommendations  None recommended by OT    Recommendations for Other Services Rehab consult    Precautions / Restrictions Precautions Precautions: Fall Precaution Comments: Lt hemiparesis, PEG Restrictions Weight Bearing Restrictions: No       Mobility Bed Mobility Overal bed mobility: Needs Assistance Bed Mobility: Supine to Sit     Supine to sit: Min guard;HOB elevated     General bed mobility comments: min guard for safety with lines due to pt quick movements  Transfers Overall transfer level: Needs assistance Equipment used: Rolling walker (2 wheeled) Transfers: Sit to/from [employer] Sit to Stand: Min assist;+2 physical assistance;+2 safety/equipment Stand pivot transfers: Min assist;+2 physical assistance;+2  safety/equipment       General transfer comment: Min A x 2 for safety in standing with RW (hand over hand to place L UE on RW appropriately). Min A x 2 for pivot to [facility] and then recliner chair using RW cues for safe sequencing throughout due to impulsivity. Pt demo ability to take a few steps forward but with premature sitting noted due to pt reported fatigue    Balance Overall balance assessment: Needs assistance Sitting-balance support: Feet supported;No upper extremity supported Sitting balance-Leahy Scale: Fair     Standing balance support: Bilateral upper extremity supported;During functional activity Standing balance-Leahy Scale: Poor Standing balance comment: UE and external support                           ADL either performed or assessed with clinical judgement   ADL Overall ADL's : Needs assistance/impaired     Grooming: Set up;Sitting;Wash/dry face Grooming Details (indicate cue type and reason): cues for thoroughness             Lower Body Dressing: Moderate assistance;Sitting/lateral leans;Sit to/from stand Lower Body Dressing Details (indicate cue type and reason): Pt Max A for donning brief, Min A for donning socks. cues for problem solving and safe sequencing Toilet Transfer: Minimal assistance;+2 for physical assistance;+2 for safety/equipment;Stand-pivot;BSC;RW Toilet Transfer Details (indicate cue type and reason): Min A x 2 for safety in [facility] transfer using RW due to L hemiparesis and quick pace         Functional mobility during ADLs: Minimal assistance;+2 for physical assistance;+2 for safety/equipment;Rolling walker;Cueing for sequencing;Cueing for safety General ADL Comments: Pt limited by impulsivity, decreased safety awareness and quick fatigue     Vision   Vision Assessment?: No apparent  visual deficits   Perception     Praxis      Cognition Arousal/Alertness: Awake/alert Behavior During Therapy: Flat affect;Impulsive Overall  Cognitive Status: History of cognitive impairments - at baseline                                 General Comments: hx of intellectual disability, pt impulsive and requires frequent cues for safety        Exercises     Shoulder Instructions       General Comments HR 120s with activity. Pulse ox with SpO2 at 97% on RA. With finger probe on L hand, SpO2 at 90%.     Pertinent Vitals/ Pain       Pain Assessment: No/denies pain  Home Living                                          Prior Functioning/Environment              Frequency  Min 2X/week        Progress Toward Goals  OT Goals(current goals can now be found in the care plan section)  Progress towards OT goals: Progressing toward goals  Acute Rehab OT Goals Patient Stated Goal: to get back to normal per sister  OT Goal Formulation: With patient/family Time For Goal Achievement: 11/20/20 Potential to Achieve Goals: Good ADL Goals Pt Will Perform Grooming: with min assist;standing Pt Will Perform Upper Body Bathing: with mod assist;sitting Pt Will Perform Lower Body Bathing: with mod assist;sit to/from stand Pt Will Perform Upper Body Dressing: with min assist;sitting Pt Will Perform Lower Body Dressing: with mod assist;sit to/from stand Pt Will Transfer to Toilet: with mod assist;ambulating;regular height toilet;bedside commode;grab bars Pt Will Perform Toileting - Clothing Manipulation and hygiene: with mod assist;sit to/from stand Additional ADL Goal #1: Pt will consistently [employer] UE as a gross assist during ADLs  Plan Discharge plan remains appropriate;Frequency remains appropriate    Co-evaluation    PT/OT/SLP Co-Evaluation/Treatment: Yes Reason for Co-Treatment: Necessary to address cognition/behavior during functional activity;For patient/therapist safety;To address functional/ADL transfers   OT goals addressed during session: ADL's and self-care      AM-PAC  OT "6 Clicks" Daily Activity     Outcome Measure   Help from another person eating meals?: Total (NPO) Help from another person taking care of personal grooming?: A Little Help from another person toileting, which includes using toliet, bedpan, or urinal?: Total Help from another person bathing (including washing, rinsing, drying)?: A Lot Help from another person to put on and taking off regular upper body clothing?: A Lot Help from another person to put on and taking off regular lower body clothing?: Total 6 Click Score: 10    End of Session Equipment Utilized During Treatment: Gait belt;Rolling walker  OT Visit Diagnosis: Unsteadiness on feet (R26.81);Cognitive communication deficit (R41.841);Hemiplegia and hemiparesis Symptoms and signs involving cognitive functions: Cerebral infarction Hemiplegia - Right/Left: Left Hemiplegia - dominant/non-dominant: Non-Dominant   Activity Tolerance Patient limited by fatigue   Patient Left in chair;with call bell/phone within reach;with chair alarm set   Nurse Communication Mobility status        Time: 7672-0947 OT Time Calculation (min): 24 min  Charges: OT General Charges $OT Visit: 1 Visit OT Treatments $Self Care/Home Management : 8-22 mins  Raynelle Fanning  Moshe Cipro, OTR/L   Lorre Munroe 11/15/2020, 10:46 AM

## 2020-11-15 NOTE — Progress Notes (Signed)
Report called to RN on 4w, all questions answered. Pt belongings gathered and placed in bed with pt for transfer.

## 2020-11-15 NOTE — Progress Notes (Signed)
Nutrition Follow-up  DOCUMENTATION CODES:   Not applicable  INTERVENTION:   Continue tube feeds via j-port:  - Osmolite 1.2 @ 75 ml/hr  75 ml free water flush every 6 hours  Tube feeding regimenat goalprovides 2160kcal, 100grams of protein, and 1489m of H2O (meets 100% of estimated needs). Total free water: 1926 ml daily  NUTRITION DIAGNOSIS:   Inadequate oral intake related to acute illness as evidenced by NPO status.  Ongoing  GOAL:   Patient will meet greater than or equal to 90% of their needs  Met with TF  MONITOR:   TF tolerance  ASSESSMENT:   64year old male who presented to the ED on 10/28 from his group home after a syncopal episode. PMH of stroke with left-sided deficits and dysphagia, Barrett's esophagus, GERD, gastroparesis, HTN, HLD, iron deficiency anemia, intellectual disability, schizophrenia, seizure disorder, hypothyroidism. Pt was found to have a large hiatal hernia compressing his trachea along with gastric and esophageal distention.  10/29 - EGD showing 10 cm hiatal hernia, 2 small gastric polyps (biopsied), irregular Z-line with hiatal hernia deformity vs short segment Barrett's esophagus (biopsied) 10/30 - transferred to ICU for respiratory distress, intubated 11/01 - Cortrak placed, tip gastric per abdominal x-ray, TF later stopped due to emesis 11/03 - s/p hiatal hernia repair, PEG tube placement, Cortrak removed 11/12 - extubated 11/15- s/p placement of jejunal arm via indwelling gastrostomy tube  Reviewed I/O's: 0 ml x 24 hours and -3.8 L since 11/01/20  UOP: 800 ml x 24 hours  Reviewed SLP notes; pt still not ready to progress to PO diet and relying solely on TF for nutritional support.   Osmolite 1.2 is infusing via j-port at 75 ml/hr. Regimen providing 2160 kcals, 100 grams protein, and 1476 ml free water, which meets 100% of estimated kcal and protein needs. Pt continues to tolerate TF well.   Medications reviewed and include  colace, miralax,   Per TOC notes, plan for possible CIR admission today.   Labs reviewed: CBGS: 142-175 (inpatient orders for glycemic control are 0-15 units insulin aspart every 4 hours).   Diet Order:   Diet Order            Diet NPO time specified Except for: Ice Chips  Diet effective now                 EDUCATION NEEDS:   No education needs have been identified at this time  Skin:  Skin Assessment: Skin Integrity Issues: Skin Integrity Issues:: Incisions Incisions: abdomen  Last BM:  11/14/20  Height:   Ht Readings from Last 1 Encounters:  10/27/20 _0  (1.626 m)    Weight:   Wt Readings from Last 1 Encounters:  11/15/20 70.6 kg    Ideal Body Weight:  59.1 kg  BMI:  Body mass index is 26.72 kg/m.  Estimated Nutritional Needs:   Kcal:  1900-2100  Protein:  100-110 gm  Fluid:  >/= 1.9 L    JLoistine Chance RD, LDN, CFarwellRegistered Dietitian II Certified Diabetes Care and Education Specialist Please refer to APhys[facility] Regional - Collier Boulevardfor RD and/or RD on-call/weekend/after hours pager

## 2020-11-15 NOTE — Plan of Care (Signed)
  Problem: Health Behavior/Discharge Planning: Goal: Ability to manage health-related needs will improve Outcome: Progressing   Problem: Clinical Measurements: Goal: Ability to maintain clinical measurements within normal limits will improve Outcome: Progressing Goal: Respiratory complications will improve Outcome: Progressing   

## 2020-11-15 NOTE — Discharge Summary (Addendum)
Physician Discharge Summary  [name] UVO:[id number] DOB: 11/02/1956 DOA: 10/20/2020  PCP: [place] date: 10/20/2020 Discharge date: 11/15/2020   Discharge Diagnoses/ Plan : Acute hypoxic/hypercapnic respiratory failure, present on admission Aspiration vs ventilator associated pneumonia; Klebsiella PNA and Citrobacter Koseri Sepsis, not present on admission Patient presenting to ED from group home with syncopal episode and was noted to be in respiratory distress. CT angiogram chest negative for PE and no evidence of acute infiltrate, pleural effusion or pneumothorax. TTE with LVEF 74-25%, grade 1 diastolic dysfunction, no aortic stenosis, normal IVC. Etiology likely secondary to his large hiatal hernia with compressive tracheal compression. Rapid response 10/22/2020 with subsequent intubation for increased work of breathing. During the initial hospitalization, patient was noted to develop pneumonia aspiration versus ventilator associated pneumonia and was treated with course of Unasyn followed by Bactrim in accordance with tracheal aspirate culture susceptibilities. --Currently oxygenating well on room air, occasionally will desaturate while sleeping;continue to monitor SPO2. --Cont recommendation.  --Chest xray done shows streaky basilar opacities and we will obtain cta chest. --cta neg for pe and pt empirically treated for pna with azithromycin. --resp failure has resolved.   Large hiatal hernia Barrett's esophagus GERD Gastroparesis CT abdomen/pelvis with large stable gastric hernia with associated esophageal dilation and subsequent mass-effect on the trachea, mild right upper lobe atelectasis. Cardiothoracic surgery was consulted. Patient underwent robotic assisted hiatal hernia repair by Dr. Kipp Brood on 10/26/2020 with PEG tube placement and IR placement of jejunal arm vent tube. Postoperatively, course complicated by gastroparesis.  Initially requiring neostigmine and Reglan, now discontinued. Tolerating tube feeds at goal. --Protonix 40 mg BID --Colace 115m BID --MiraLAX BID --zofran prn --Speech therapy consulted for reevaluation given patient now extubated and bowel function seems to be improved and at goal with tube feeds. Pt is alert and awake and oriented to person place and president. TF continued.  No change in TF regimen.     Hypothyroidism --Levothyroxine 75 mcg daily --no change .   Schizophrenia Intellectual delay Seizure disorder --Valproic acid 250 mg BID --Lamictal 100 mg daily --stable cont Lamictal and valproate.   Hyperlipidemia:Crestor 20 mg daily Cont Crestor no change.   Essential hypertension On benazepril 20 mg p.o. daily and propranolol 10 mg p.o. daily at home. --BP 110/66 this morning --Continue to hold home antihypertensives --Monitor BP closely daily. Blood pressure 108/69, pulse 68, temperature 97.9 F (36.6 C), temperature source Oral, resp. rate 17, height _0  (1.626 m), weight 69.2 kg, SpO2 93 %. cont benazepril and propranolol.    History of CVA with left-sided deficits and dysphagia CT head without contrast with no acute intracranial abnormality but does note large area encephalomalacia involving right frontal lobe. --Continue tube feeds --PT/OT  Weakness, debility, deconditioning: --continue PT/OT efforts --Likely needs SNF vs LTACH vs CIR --TOC for placement  DM II --a1c is 5.8 and we will continue current regimen of SSI, accucheck per glycemic protocol and  Follow.   Discharge Condition:  Stable   Diet recommendation:  Feeding tube.   Filed Weights   11/13/20 0437 11/14/20 0500 11/15/20 0500  Weight: 69.2 kg 69.3 kg 70.6 kg    Discharge activity: Per Rehab.  History of present illness:  RMiracle CriadoRobinsonis a 64year old male with past medical history significant for ischemic CVA with left-sided deficits/dysphagia,  Barrett's esophagus, GERD, gastroparesis, essential hypertension, hyperlipidemia, iron deficiency anemia, intellectual deficiency, schizophrenia, seizure disorder, hypothyroidism who presented to the ED via EMS from his group home for  evaluation of fall and syncopal episode. Per staff at group home, patient's fall unwitnessed and when trying to assist him to his feet he had a syncopal episode. He was noted to be pale diaphoretic by EMS with tachycardia with HR 120-130. SPO2 was noted to be 88% on room air. CBG 255.  In the ED, afebrile with persistent tachycardia and tachypnea. Requiring 3 L of supplemental oxygen. Sodium 136, potassium 4.5, chloride 104, bicarb 19, anion gap 13, BUN 37, creatinine 1.7 with baseline 1.2, glucose 163, SARS-CoV-2 PCR negative. Influenzinum panel negative. D-dimer 0.62. Procalcitonin 0.11, lactic acid 1.5. Chest x-ray with no acute infiltrate but does note marked severely gastric and esophageal distention. CT angiogram chest negative for PE. CT abdomen/pelvis with large, stable gastric hernia with associated esophageal dilation and subsequent mass-effect on the trachea. EDP discussed case with cardiothoracic surgery, Dr. Kipp Brood will consult. Admitted to hospitalist service.  Patient was initially admitted to the hospital service on 10/21/2020, patient underwent EGD on 10/21/2020. Early morning on 10/22/2020, rapid response as patient developed significant respiratory distress and was subsequently intubated and transferred to ICU under the critical care service. Patient underwent hiatal hernia repair with G-tube placement by cardiothoracic surgery, Dr. Kipp Brood on 10/26/2020. Patient was extubated on 11/05/2020. Transferred back to hospital service on 11/06/2020.  Hospital Course:  Since coming on to medical service from the 14th patient has been stable.  PT has been treated for gastroparesis and PT/OT has been working with patient.  Pt has been doing  well . I have had pt for this past 5 days and I met sister and informed her That his cough is not related to blood clot or pneumonia. Pt is stable at RA. Pt was started on 5 days of po azithromycin for ?> infection of ct scan and he is at baseline, Stable for d/c to inpatient rehab.Plan is to reassess pt's  Swallow and restart diet slowly.  Discussed plan with sister and speech therapist.    Procedures:  Intubation 10/30; extubated 11/13  EGD, Dr. Carlean Purl 10/30  Left Ramos CVL 10/30 - 11/13  Robotic assisted hiatal hernia repair 11/3, Dr. Kipp Brood  PEG 11/3, jejunal arm vent tube placement IR 11/15   Consultations:  PCCM - signed off 11/14  Cardiothoracic surgery  [facility]   Antimicrobials:   Bactrim 11/10 - 11/15  Unasyn 10/29 - 11/1    Discharge Exam: Vitals:   11/15/20 0537 11/15/20 0742  BP: 116/76 115/73  Pulse: 88 89  Resp: 19   Temp: 97.9 F (36.6 C) 98.5 F (36.9 C)  SpO2: 96% 95%    Physical Exam Constitutional:      General: He is awake.     Appearance: Normal appearance.  HENT:     Head: Normocephalic and atraumatic.     Jaw: There is normal jaw occlusion.     Right Ear: External ear normal.     Left Ear: External ear normal.     Nose: Nose normal.     Mouth/Throat:     Lips: Pink.     Mouth: Mucous membranes are moist.     Tongue: No lesions. Tongue does not deviate from midline.     Pharynx: Oropharynx is clear.  Eyes:     General: Lids are normal.        Right eye: No foreign body.        Left eye: No foreign body.     Extraocular Movements:     Right eye: Normal extraocular  motion.     Left eye: Normal extraocular motion.  Cardiovascular:     Rate and Rhythm: Normal rate and regular rhythm.     Pulses: Normal pulses.  Pulmonary:     Effort: Pulmonary effort is normal.     Breath sounds: Normal breath sounds and air entry.  Abdominal:     General: Bowel sounds are normal. There is no distension.     Palpations: Abdomen  is soft.     Tenderness: There is no abdominal tenderness.  Skin:    General: Skin is warm.  Neurological:     General: No focal deficit present.     Mental Status: He is alert, oriented to person, place, and time and easily aroused.     Cranial Nerves: Cranial nerves are intact.     Motor: Weakness present.     Comments: Pt has baseline left upper extremity weakness from his prior stroke.   Psychiatric:        Attention and Perception: Attention normal.        Mood and Affect: Mood normal. Affect is flat.        Behavior: Behavior normal. Behavior is cooperative.    Discharge Instructions   Discharge Instructions    Call MD for:  difficulty breathing, headache or visual disturbances   Complete by: As directed    Call MD for:  extreme fatigue   Complete by: As directed    Call MD for:  hives   Complete by: As directed    Call MD for:  persistant dizziness or light-headedness   Complete by: As directed    Call MD for:  persistant nausea and vomiting   Complete by: As directed    Call MD for:  redness, tenderness, or signs of infection (pain, swelling, redness, odor or green/yellow discharge around incision site)   Complete by: As directed    Call MD for:  severe uncontrolled pain   Complete by: As directed    Change dressing (specify)   Complete by: As directed    Dressing change:  Keep area clean and dry and seek medical advice if any redness or drainage   Diet - low sodium heart healthy   Complete by: As directed    Discharge instructions   Complete by: As directed    Please follow up with all scheduled appt and pcp and specialists as requested.   Increase activity slowly   Complete by: As directed    Other Restrictions   Complete by: As directed    All activity recommendation per PT and rehab.     Allergies as of 11/15/2020      Reactions   Vancomycin Other (See Comments)   Unknown-possible hives      Medication List    STOP taking these medications    benazepril 20 MG tablet Commonly known as: LOTENSIN   divalproex 500 MG 24 hr tablet Commonly known as: DEPAKOTE ER   esomeprazole 40 MG capsule Commonly known as: NEXIUM   metoCLOPramide 5 MG tablet Commonly known as: REGLAN   propranolol 10 MG tablet Commonly known as: INDERAL     TAKE these medications   acetaminophen 650 MG suppository Commonly known as: TYLENOL Place 1 suppository (650 mg total) rectally every 4 (four) hours as needed for fever.   albuterol (2.5 MG/3ML) 0.083% nebulizer solution Commonly known as: PROVENTIL Take 3 mLs (2.5 mg total) by nebulization every 2 (two) hours as needed for wheezing or shortness of breath.  azithromycin 200 MG/5ML suspension Commonly known as: ZITHROMAX Place 12.5 mLs (500 mg total) into feeding tube daily. Start taking on: November 16, 2020   bacitracin ointment Apply topically daily. Start taking on: November 16, 2020   chlorhexidine gluconate (MEDLINE KIT) 0.12 % solution Commonly known as: PERIDEX 15 mLs by Mouth Rinse route 2 (two) times daily.   docusate 50 MG/5ML liquid Commonly known as: COLACE Place 10 mLs (100 mg total) into feeding tube 2 (two) times daily.   feeding supplement (OSMOLITE 1.2 CAL) Liqd Place 1,000 mLs into feeding tube continuous.   free water Soln Place 75 mLs into feeding tube every 4 (four) hours for 10 days.   lamoTRIgine 100 MG tablet Commonly known as: LAMICTAL Place 1 tablet (100 mg total) into feeding tube daily. Start taking on: November 16, 2020 What changed: how to take this   levothyroxine 75 MCG tablet Commonly known as: SYNTHROID Place 1 tablet (75 mcg total) into feeding tube daily at 6 (six) AM. Start taking on: November 16, 2020 What changed:   how to take this  when to take this   lip balm ointment Apply topically as needed for lip care.   LORazepam 2 MG/ML injection Commonly known as: ATIVAN Inject 0.5 mLs (1 mg total) into the vein every 6 (six) hours  as needed for anxiety (aggitation).   oxyCODONE 5 MG/5ML solution Commonly known as: ROXICODONE Place 7.5 mLs (7.5 mg total) into feeding tube every 6 (six) hours as needed for up to 3 days for moderate pain.   pantoprazole sodium 40 mg/20 mL Pack Commonly known as: PROTONIX Place 20 mLs (40 mg total) into feeding tube 2 (two) times daily for 10 days.   QUEtiapine 400 MG tablet Commonly known as: SEROQUEL Place 1 tablet (400 mg total) into feeding tube 2 (two) times daily for 10 days. What changed: how to take this   rosuvastatin 20 MG tablet Commonly known as: CRESTOR Place 1 tablet (20 mg total) into feeding tube daily. Start taking on: November 16, 2020 What changed: how to take this   valproic acid 250 MG/5ML solution Commonly known as: DEPAKENE Place 5 mLs (250 mg total) into feeding tube 2 (two) times daily.   Vitamin D 50 MCG (2000 UT) tablet Take 2,000 Units by mouth daily.            Discharge Care Instructions  (From admission, onward)         Start     Ordered   11/15/20 0000  Change dressing (specify)       Comments: Dressing change:  Keep area clean and dry and seek medical advice if any redness or drainage   11/15/20 1323         Allergies  Allergen Reactions  . Vancomycin Other (See Comments)    Unknown-possible hives       The results of significant diagnostics from this hospitalization (including imaging, microbiology, ancillary and laboratory) are listed below for reference.    Significant Diagnostic Studies: DG Abd 1 View  Result Date: 11/02/2020 CLINICAL DATA:  Abdominal distension EXAM: ABDOMEN - 1 VIEW COMPARISON:  10/24/2020 FINDINGS: Gastrostomy catheter is now seen in place. Previously noted feeding catheter is been removed. Scattered large and small bowel gas is noted. The stomach is significantly distended. No free air is noted. IMPRESSION: Gaseous distension of the stomach. Gastrostomy catheter in place. Electronically Signed    By: Inez Catalina M.D.   On: 11/02/2020 10:04   DG  Abd 1 View  Result Date: 10/24/2020 CLINICAL DATA:  Feeding tube placement EXAM: ABDOMEN - 1 VIEW COMPARISON:  Portable exam 0940 hours compared to 09/05/2020 FINDINGS: Tip of feeding tube projects over distal gastric antrum/pylorus. LEFT subclavian central venous catheter with tip projecting over SVC and azygos arch. RIGHT perihilar infiltrate. Atelectasis versus consolidation LEFT lower lobe. Visualized bowel gas pattern normal. IMPRESSION: RIGHT perihilar infiltrate with atelectasis versus consolidation LEFT lower lobe. Tip of feeding tube projects over distal gastric antrum/pylorus. Electronically Signed   By: Lavonia Dana M.D.   On: 10/24/2020 09:46   CT HEAD WO CONTRAST  Result Date: 10/21/2020 CLINICAL DATA:  Syncope EXAM: CT HEAD WITHOUT CONTRAST TECHNIQUE: Contiguous axial images were obtained from the base of the skull through the vertex without intravenous contrast. COMPARISON:  March 25, 2018 FINDINGS: Brain: No evidence of acute territorial infarction, hemorrhage, hydrocephalus,extra-axial collection or mass lesion/mass effect. There is ex vacuo dilatation of the right lateral ventricle. Low-attenuation changes in the deep white matter consistent with small vessel ischemia. Large area of encephalomalacia involving the right frontal lobe. Vascular: No hyperdense vessel or unexpected calcification. Skull: Prior frontal craniotomy defect is noted. No fracture or focal lesion identified. Sinuses/Orbits: The visualized paranasal sinuses and mastoid air cells are clear. The orbits and globes intact. Other: None IMPRESSION: No acute intracranial abnormality. Large area encephalomalacia involving the right frontal lobe Electronically Signed   By: Prudencio Pair M.D.   On: 10/21/2020 03:49   CT ANGIO CHEST PE W OR WO CONTRAST  Result Date: 11/13/2020 CLINICAL DATA:  Respiratory failure. EXAM: CT ANGIOGRAPHY CHEST WITH CONTRAST TECHNIQUE: Multidetector  CT imaging of the chest was performed using the standard protocol during bolus administration of intravenous contrast. Multiplanar CT image reconstructions and MIPs were obtained to evaluate the vascular anatomy. CONTRAST:  169m OMNIPAQUE IOHEXOL 350 MG/ML SOLN COMPARISON:  10/05/2007 FINDINGS: Cardiovascular: Contrast injection is sufficient to demonstrate satisfactory opacification of the pulmonary arteries to the segmental level. There is no pulmonary embolus or evidence of right heart strain. The size of the main pulmonary artery is normal. Heart size is normal, with no pericardial effusion. The course and caliber of the aorta are normal. There is no atherosclerotic calcification. Opacification decreased due to pulmonary arterial phase contrast bolus timing. Mediastinum/Nodes: -- No mediastinal lymphadenopathy. -- No hilar lymphadenopathy. -- No axillary lymphadenopathy. --there are few mildly enlarged bilateral supraclavicular lymph nodes of unknown clinical significance. -- Normal thyroid gland where visualized. -the esophagus is patulous and dilated. The patient appears to be status post prior hiatal hernia repair. There is a residual small hiatal hernia. Lungs/Pleura: There is atelectasis at the lung bases bilaterally with trace bilateral pleural effusions. There is mucous plugging and bronchial wall thickening primarily at the lung bases. There is right apical pleuroparenchymal scarring. There is no pneumothorax. Upper Abdomen: Contrast bolus timing is not optimized for evaluation of the abdominal organs. The stomach is distended. There is a partially visualized gastrojejunostomy tube in place. Musculoskeletal: No chest wall abnormality. No bony spinal canal stenosis. Review of the MIP images confirms the above findings. IMPRESSION: 1. There is no evidence for acute pulmonary embolus or aortic dissection. 2. There is atelectasis at the lung bases bilaterally with trace bilateral pleural effusions. 3. There  is mucous plugging and bronchial wall thickening primarily at the lung bases. Findings may be secondary to infectious or reactive bronchiolitis. 4. The esophagus is patulous and dilated. The patient appears to be status post prior hiatal hernia repair. There is  a residual small hiatal hernia. 5. The stomach is distended. There is a partially visualized gastrojejunostomy tube in place. 6. Mildly enlarged bilateral supraclavicular lymph nodes of unknown clinical significance. Electronically Signed   By: Constance Holster M.D.   On: 11/13/2020 19:57   CT Angio Chest PE W/Cm &/Or Wo Cm  Result Date: 10/21/2020 CLINICAL DATA:  Status post fall with hypoxia and hiccups. EXAM: CT ANGIOGRAPHY CHEST CT ABDOMEN AND PELVIS WITH CONTRAST TECHNIQUE: Multidetector CT imaging of the chest was performed using the standard protocol during bolus administration of intravenous contrast. Multiplanar CT image reconstructions and MIPs were obtained to evaluate the vascular anatomy. Multidetector CT imaging of the abdomen and pelvis was performed using the standard protocol during bolus administration of intravenous contrast. CONTRAST:  20m OMNIPAQUE IOHEXOL 350 MG/ML SOLN COMPARISON:  None. FINDINGS: CT CHEST FINDINGS Cardiovascular: No significant vascular findings. Normal heart size. No pericardial effusion. Mediastinum/Nodes: No enlarged mediastinal, hilar, or axillary lymph nodes. The esophagus is markedly dilated (approximately 5.9 cm x 3.9 cm) and extends inferiorly into a large gastric hernia. Subsequent mass effect is seen on the trachea with anterior tracheal displacement noted. Lungs/Pleura: Very mild atelectasis is seen within the posteromedial aspect of the right upper lobe. There is no evidence of acute infiltrate, pleural effusion or pneumothorax. Musculoskeletal: No chest wall mass or suspicious bone lesions identified. CT ABDOMEN PELVIS FINDINGS Hepatobiliary: No focal liver abnormality is seen. No gallstones,  gallbladder wall thickening, or biliary dilatation. Pancreas: Unremarkable. No pancreatic ductal dilatation or surrounding inflammatory changes. Spleen: Normal in size without focal abnormality. Adrenals/Urinary Tract: Adrenal glands are unremarkable. Kidneys are normal, without renal calculi, focal lesion, or hydronephrosis. Bladder is unremarkable. Stomach/Bowel: A large, stable gastric hernia is seen. Appendix appears normal. No evidence of bowel wall thickening, distention, or inflammatory changes. Small, noninflamed diverticula are seen within the sigmoid colon. Vascular/Lymphatic: Aortic atherosclerosis. No enlarged abdominal or pelvic lymph nodes. Reproductive: Prostate is unremarkable. Other: No abdominal wall hernia or abnormality. No abdominopelvic ascites. Musculoskeletal: No acute or significant osseous findings. IMPRESSION: 1. Large, stable gastric hernia with associated esophageal dilatation and subsequent mass effect on the trachea. 2. Very mild right upper lobe atelectasis. 3. Sigmoid diverticulosis. 4. Aortic atherosclerosis. Aortic Atherosclerosis (ICD10-I70.0). Electronically Signed   By: TVirgina NorfolkM.D.   On: 10/21/2020 00:20   CT ABDOMEN PELVIS W CONTRAST  Result Date: 10/21/2020 CLINICAL DATA:  Unwitnessed fall with hypoxia, hiccups and coughing. EXAM: CT CHEST, ABDOMEN, AND PELVIS WITH CONTRAST TECHNIQUE: Multidetector CT imaging of the chest, abdomen and pelvis was performed following the standard protocol during bolus administration of intravenous contrast. CONTRAST:  766mOMNIPAQUE IOHEXOL 350 MG/ML SOLN COMPARISON:  Abdomen pelvis CT, dated September 05, 2020 FINDINGS: CT CHEST FINDINGS Cardiovascular: No significant vascular findings. Normal heart size. No pericardial effusion. Mediastinum/Nodes: No enlarged mediastinal, hilar, or axillary lymph nodes. The esophagus is markedly dilated (approximately 5.9 cm x 3.9 cm) and extends inferiorly into a large gastric hernia.  Subsequent mass effect is seen on the trachea with anterior tracheal displacement noted. Lungs/Pleura: Very mild atelectasis is seen within the posteromedial aspect of the right upper lobe. There is no evidence of acute infiltrate, pleural effusion or pneumothorax. Musculoskeletal: No chest wall mass or suspicious bone lesions identified. CT ABDOMEN PELVIS FINDINGS Hepatobiliary: No focal liver abnormality is seen. No gallstones, gallbladder wall thickening, or biliary dilatation. Pancreas: Unremarkable. No pancreatic ductal dilatation or surrounding inflammatory changes. Spleen: Normal in size without focal abnormality. Adrenals/Urinary Tract: Adrenal glands are unremarkable.  Kidneys are normal, without renal calculi, focal lesion, or hydronephrosis. Bladder is unremarkable. Stomach/Bowel: A large, stable gastric hernia is seen. Appendix appears normal. No evidence of bowel wall thickening, distention, or inflammatory changes. Small, noninflamed diverticula are seen within the sigmoid colon. Vascular/Lymphatic: Aortic atherosclerosis. No enlarged abdominal or pelvic lymph nodes. Reproductive: Prostate is unremarkable. Other: No abdominal wall hernia or abnormality. No abdominopelvic ascites. Musculoskeletal: No acute or significant osseous findings. IMPRESSION: 1. Large, stable gastric hernia with associated esophageal dilatation and subsequent mass effect on the trachea. 2. Very mild right upper lobe atelectasis. 3. Sigmoid diverticulosis. 4. Aortic atherosclerosis. Aortic Atherosclerosis (ICD10-I70.0). Electronically Signed   By: Virgina Norfolk M.D.   On: 10/21/2020 00:13   IR GASTR TUBE CONVERT GASTR-JEJ PER W/FL MOD SED  Result Date: 11/07/2020 INDICATION: 64 year old male with history of recently surgically placed gastrostomy tube now with gastro paresis, therefore jejunal arm extension is requested for feeds. EXAM: CONVERT G-TUBE TO G-JTUBE MEDICATIONS: None. ANESTHESIA/SEDATION: None. CONTRAST:  56m  OMNIPAQUE IOHEXOL 300 MG/ML SOLN - administered into the gastric lumen. FLUOROSCOPY TIME:  Fluoroscopy Time: 5 minutes 12 seconds (114 mGy). COMPLICATIONS: None immediate. PROCEDURE: Informed written consent was obtained from the patient after a thorough discussion of the procedural risks, benefits and alternatives. All questions were addressed. Maximal Sterile Barrier Technique was utilized including caps, mask, sterile gowns, sterile gloves, sterile drape, hand hygiene and skin antiseptic. A timeout was performed prior to the initiation of the procedure. Gentle hand injection via the indwelling gastrostomy demonstrated appropriate position within the gastric lumen. The excess length of the external portion of the gastrostomy tube was cut. A stiff Glidewire was advanced into the gastric lumen but unable to be position within the duodenum. Therefore, over the Glidewire a 7 French flexor angled tip sheath was placed was was directed through the pylorus into the first portion the duodenum. The Glidewire then was able to be passed to the proximal jejunum over which, in a coaxial fashion an angled tip catheter followed. The Glidewire was removed and exchanged for a stiff Amplatz wire. Over the Amplatz wire, a 9 [place] conversion kit was inserted through the indwelling gastrostomy tube. Contrast injection demonstrated the tip of the gastro jejunal tube in the proximal jejunum, just past the ligament of Treitz. Additional injection through the indwelling gastrostomy port demonstrated appropriate flow into the gastric lumen. The ports were flushed with saline. The patient tolerated the procedure well without complication IMPRESSION: Successful insertion of a 9 French gastrojejunal limb through the indwelling gastrostomy tube. The tip of the jejunal arm is in the proximal jejunum, just distal to the ligament of Treitz. DRuthann Cancer MD [facility] Electronically  Signed   By: DRuthann CancerMD   On: 11/07/2020 17:38   DG Chest Port 1 View  Result Date: 11/12/2020 CLINICAL DATA:  Loss of consciousness, cough EXAM: PORTABLE CHEST 1 VIEW COMPARISON:  Radiograph 11/04/2020 FINDINGS: Removal of a previously seen endotracheal tube. Partial visualization of a percutaneous gastrojejunostomy tube in the upper abdomen. Telemetry leads overlie the chest. Lung volumes are low with streaky opacities favoring atelectatic changes and vascular crowding. No consolidative opacity is seen. No pneumothorax or visible effusion. The cardiomediastinal contours are unremarkable. No acute osseous or soft tissue abnormality. IMPRESSION: Low lung volumes with streaky opacities basilar opacities. Aspiration/airspace disease is not fully excluded. Electronically Signed   By: PLovena LeM.D.   On: 11/12/2020 19:48   DG CHEST PORT 1 VIEW  Result Date: 11/04/2020 CLINICAL DATA:  Respiratory failure. EXAM: PORTABLE CHEST 1 VIEW COMPARISON:  Chest x-ray 11/02/2020 FINDINGS: The endotracheal tube is 5.4 cm above the carina. The left subclavian central venous catheter is stable. The cardiac silhouette, mediastinal and hilar contours are stable. Persistent right lower lobe infiltrate and probable small effusion. Slight improved left basilar aeration when compared to prior study. No definite pneumothorax. IMPRESSION: 1. Stable support apparatus. 2. Persistent right lower lobe infiltrate and probable small effusion. Electronically Signed   By: Marijo Sanes M.D.   On: 11/04/2020 07:16   DG CHEST PORT 1 VIEW  Result Date: 11/02/2020 CLINICAL DATA:  Acute hypoxemic respiratory failure. EXAM: PORTABLE CHEST 1 VIEW COMPARISON:  October 29, 2020. FINDINGS: Stable cardiomediastinal silhouette. Endotracheal tube is in grossly good position. Left subclavian catheter is unchanged. No pneumothorax is noted. Bilateral lung opacities are noted concerning for edema or atelectasis with associated pleural  effusions. Bony thorax is unremarkable. IMPRESSION: Stable support apparatus. Bilateral lung opacities are noted concerning for edema or atelectasis with associated pleural effusions. No pneumothorax is noted. Electronically Signed   By: Marijo Conception M.D.   On: 11/02/2020 08:00   DG CHEST PORT 1 VIEW  Result Date: 10/29/2020 CLINICAL DATA:  Follow-up pneumothorax EXAM: PORTABLE CHEST 1 VIEW COMPARISON:  Chest radiograph from one day prior. FINDINGS: Endotracheal tube tip is 1.9 cm above the carina. Left subclavian central venous catheter terminates over the upper third of the SVC. Stable cardiomediastinal silhouette with top-normal heart size. No residual pneumothorax. Probable small bilateral pleural effusions, similar. Low lung volumes, slightly improved. Hazy bibasilar lung opacities, slightly improved, favor atelectasis. IMPRESSION: 1. No residual pneumothorax. 2. Low lung volumes and hazy bibasilar lung opacities, improved, favor atelectasis. 3. Probable small bilateral pleural effusions, similar. Electronically Signed   By: Ilona Sorrel M.D.   On: 10/29/2020 08:08   DG CHEST PORT 1 VIEW  Result Date: 10/28/2020 CLINICAL DATA:  Pneumothorax follow-up EXAM: PORTABLE CHEST 1 VIEW COMPARISON:  Yesterday FINDINGS: Endotracheal tube with tip just above the clavicular heads, unchanged. Left subclavian line with tip at the left brachiocephalic/SVC junction.Right apical pneumothorax measuring 18 mm craniocaudal thickness. Low volume chest with hazy opacity on both sides. Stable heart size distorted by rightward rotation. IMPRESSION: 1. Unchanged small right apical pneumothorax. 2. Low volume chest with progressive volume loss. Electronically Signed   By: Monte Fantasia M.D.   On: 10/28/2020 05:10   DG CHEST PORT 1 VIEW  Result Date: 10/27/2020 CLINICAL DATA:  Pneumothorax EXAM: PORTABLE CHEST 1 VIEW COMPARISON:  10/24/2020 FINDINGS: Right apical pneumothorax approximately 2 cm. This has developed since  the prior study. Endotracheal tube in good position. Left subclavian central venous catheter tip in the mid SVC unchanged. Bibasilar airspace disease similar to the prior study. Possible small effusions. IMPRESSION: Interval development of 2 cm right apical pneumothorax. Bibasilar airspace disease unchanged Electronically Signed   By: Franchot Gallo M.D.   On: 10/27/2020 09:06   DG Chest Port 1 View  Result Date: 10/24/2020 CLINICAL DATA:  Respiratory failure EXAM: PORTABLE CHEST 1 VIEW COMPARISON:  10/23/2020 FINDINGS: Tracheostomy tube tip is above the carina. Left subclavian central venous catheter tip is in the SVC. Large hiatal hernia. Airspace opacification involving the right midlung and right base is unchanged. IMPRESSION: 1. No change in aeration to the right midlung and right base. 2. Stable support apparatus.  No Electronically Signed   By: Kerby Moors M.D.   On: 10/24/2020 07:29   DG  Chest Port 1 View  Result Date: 10/23/2020 CLINICAL DATA:  Respiratory failure. EXAM: PORTABLE CHEST 1 VIEW COMPARISON:  10/22/2020 and earlier studies FINDINGS: Endotracheal tube and left subclavian central venous line are stable. Hazy airspace opacity is noted in a right perihilar and lower lung distribution with additional airspace opacity at the left medial lung base. The appearance is unchanged compared to the previous day's exam. No new lung abnormalities. No pneumothorax. IMPRESSION: 1. Right greater than left perihilar and lower lung airspace opacities, which may be due to edema, atelectasis or a combination. This is stable when compared to the previous day's exam. 2. Stable well-positioned support apparatus. Electronically Signed   By: Lajean Manes M.D.   On: 10/23/2020 06:44   DG Chest Port 1 View  Result Date: 10/22/2020 CLINICAL DATA:  Central line placement EXAM: PORTABLE CHEST 1 VIEW COMPARISON:  10/22/2020 FINDINGS: There is a new subclavian left-sided central venous catheter with tip  projecting over the SVC. The endotracheal tube terminates above the carina. There are worsening perihilar airspace opacities, right greater than left. There are probable small bilateral pleural effusions. IMPRESSION: 1. New left-sided central venous catheter as above. No pneumothorax. 2. Worsening bilateral perihilar airspace opacities. Electronically Signed   By: Constance Holster M.D.   On: 10/22/2020 16:49   Portable Chest x-ray  Result Date: 10/22/2020 CLINICAL DATA:  Endotracheal tube placement EXAM: PORTABLE CHEST 1 VIEW COMPARISON:  10/20/2020 FINDINGS: Endotracheal tube tip is just above the carina. Retraction by 1-2 cm is recommended. There is bibasilar atelectasis. No focal airspace consolidation. No pleural effusion or pneumothorax. Normal cardiomediastinal contours. IMPRESSION: 1. Endotracheal tube tip just above the carina. Retraction by 1-2 cm is recommended. 2. Bibasilar atelectasis. Electronically Signed   By: Ulyses Jarred M.D.   On: 10/22/2020 05:28   DG Chest Port 1 View  Result Date: 10/20/2020 CLINICAL DATA:  Hypoxia and cough. EXAM: PORTABLE CHEST 1 VIEW COMPARISON:  September 05, 2020 FINDINGS: Low lung volumes are seen which is likely, in part, secondary to the degree of patient inspiration. There is no evidence of acute infiltrate, pleural effusion or pneumothorax. The heart size and mediastinal contours are within normal limits. A markedly distended and tortuous air-filled esophagus is seen. Marked severity gastric distension is also present. The visualized skeletal structures are unremarkable. IMPRESSION: 1. Low lung volumes without evidence of acute infiltrate. 2. Marked severity gastric and esophageal distention. Sequelae associated with gastric outlet or small bowel obstruction cannot be excluded. Correlation with abdomen pelvis CT is recommended. Electronically Signed   By: Virgina Norfolk M.D.   On: 10/20/2020 21:44   EEG adult  Result Date: 10/21/2020 Lora Havens, MD     10/21/2020 12:29 PM Patient Name: [name] MRN: [id number] Epilepsy Attending: Lora Havens Referring Physician/Provider: Dr Derrick Ravel Date: 10/21/2020 Duration: 25.58 mins Patient history: 64 year old male with syncope.  EEG to evaluate for seizures. Level of alertness: Awake, drowsy AEDs during EEG study: VPA Technical aspects: This EEG study was done with scalp electrodes positioned according to the 10-20 International system of electrode placement. Electrical activity was acquired at a sampling rate of _0  and reviewed with a high frequency filter of _1  and a low frequency filter of _2 . EEG data were recorded continuously and digitally stored. Description: The posterior dominant rhythm consists of 6.5 Hz activity of moderate voltage (25-35 uV) seen predominantly in posterior head regions, symmetric and reactive to eye opening and eye closing.  Drowsiness is characterized by attenuation  of the posterior dominant rhythm.  EEG showed continuous generalized 3 to 6 Hz theta-delta slowing. Physiologic photic driving was not seen during photic stimulation.  Hyperventilation was not performed.   ABNORMALITY -Continuous slow, generalized - Background slow IMPRESSION: This study is suggestive of mild to moderate diffuse encephalopathy, nonspecific etiology. No seizures or epileptiform discharges were seen throughout the recording. Lora Havens   ECHOCARDIOGRAM COMPLETE  Result Date: 10/21/2020    ECHOCARDIOGRAM REPORT   Patient Name:   ASA BAUDOIN Date of Exam: 10/21/2020 Medical Rec #:  [id number]            Height:       63.0 in Accession #:    [id number]           Weight:       169.8 lb Date of Birth:  10-22-1956           BSA:          1.803 m Patient Age:    64 years             BP:           103/85 mmHg Patient Gender: M                    HR:           106 bpm. Exam Location:  Inpatient Procedure: 2D Echo, Cardiac Doppler, Color Doppler and  Intracardiac            Opacification Agent Indications:    Syncope 780.2  History:        Patient has prior history of Echocardiogram examinations, most                 recent 10/07/2007. Risk Factors:Hypertension, Dyslipidemia and                 Non-Smoker. GERD.  Sonographer:    Vickie Epley RDCS Referring Phys: [id number] [place]  1. There is an intracavitary gradient up to 24 mmHG due to the hyperdynamic LV function. Left ventricular ejection fraction, by estimation, is 70 to 75%. The left ventricle has hyperdynamic function. The left ventricle has no regional wall motion abnormalities. Left ventricular diastolic parameters are consistent with Grade I diastolic dysfunction (impaired relaxation).  2. Right ventricular systolic function is normal. The right ventricular size is normal. Tricuspid regurgitation signal is inadequate for assessing PA pressure.  3. The mitral valve is grossly normal. No evidence of mitral valve regurgitation. No evidence of mitral stenosis.  4. The aortic valve is tricuspid. Aortic valve regurgitation is not visualized. No aortic stenosis is present.  5. The inferior vena cava is normal in size with greater than 50% respiratory variability, suggesting right atrial pressure of 3 mmHg. FINDINGS  Left Ventricle: There is an intracavitary gradient up to 24 mmHG due to the hyperdynamic LV function. Left ventricular ejection fraction, by estimation, is 70 to 75%. The left ventricle has hyperdynamic function. The left ventricle has no regional wall motion abnormalities. Definity contrast agent was given IV to delineate the left ventricular endocardial borders. The left ventricular internal cavity size was normal in size. There is no left ventricular hypertrophy. Left ventricular diastolic parameters are consistent with Grade I diastolic dysfunction (impaired relaxation). Right Ventricle: The right ventricular size is normal. No increase in right ventricular wall thickness.  Right ventricular systolic function is normal. Tricuspid regurgitation signal is inadequate for assessing PA pressure. Left Atrium: Left atrial size was  normal in size. Right Atrium: Right atrial size was normal in size. Pericardium: There is no evidence of pericardial effusion. Mitral Valve: The mitral valve is grossly normal. No evidence of mitral valve regurgitation. No evidence of mitral valve stenosis. Tricuspid Valve: The tricuspid valve is grossly normal. Tricuspid valve regurgitation is not demonstrated. No evidence of tricuspid stenosis. Aortic Valve: The aortic valve is tricuspid. Aortic valve regurgitation is not visualized. No aortic stenosis is present. Pulmonic Valve: The pulmonic valve was grossly normal. Pulmonic valve regurgitation is not visualized. No evidence of pulmonic stenosis. Aorta: The aortic root is normal in size and structure. Venous: The inferior vena cava is normal in size with greater than 50% respiratory variability, suggesting right atrial pressure of 3 mmHg. IAS/Shunts: The atrial septum is grossly normal.  LEFT VENTRICLE PLAX 2D LVIDd:         3.80 cm     Diastology LVIDs:         2.70 cm     LV e' medial:    6.85 cm/s LV PW:         0.80 cm     LV E/e' medial:  11.6 LV IVS:        0.80 cm     LV e' lateral:   7.51 cm/s LVOT diam:     1.80 cm     LV E/e' lateral: 10.6 LV SV:         58 LV SV Index:   32 LVOT Area:     2.54 cm  LV Volumes (MOD) LV vol d, MOD A4C: 41.8 ml LV vol s, MOD A4C: 12.0 ml LV SV MOD A4C:     41.8 ml RIGHT VENTRICLE RV S prime:     10.00 cm/s TAPSE (M-mode): 2.0 cm LEFT ATRIUM             Index       RIGHT ATRIUM          Index LA diam:        3.10 cm 1.72 cm/m  RA Area:     8.05 cm LA Vol (A2C):   24.7 ml 13.70 ml/m RA Volume:   13.00 ml 7.21 ml/m LA Vol (A4C):   21.7 ml 12.03 ml/m LA Biplane Vol: 24.5 ml 13.58 ml/m  AORTIC VALVE LVOT Vmax:   148.00 cm/s LVOT Vmean:  103.000 cm/s LVOT VTI:    0.227 m  AORTA Ao Root diam: 3.10 cm MITRAL VALVE MV Area  (PHT): 4.49 cm    SHUNTS MV Decel Time: 169 msec    Systemic VTI:  0.23 m MV E velocity: 79.30 cm/s  Systemic Diam: 1.80 cm MV A velocity: 89.70 cm/s MV E/A ratio:  0.88 Eleonore Chiquito MD Electronically signed by Eleonore Chiquito MD Signature Date/Time: 10/21/2020/2:06:21 PM    Final    VAS [place] UPPER EXTREMITY VENOUS DUPLEX  Result Date: 10/28/2020 UPPER VENOUS STUDY  Indications: Swelling, and Edema Risk Factors: Immobility. Performing Technologist: Griffin Basil RCT RDMS  Examination Guidelines: A complete evaluation includes B-mode imaging, spectral Doppler, color Doppler, and power Doppler as needed of all accessible portions of each vessel. Bilateral testing is considered an integral part of a complete examination. Limited examinations for reoccurring indications may be performed as noted.  Left Findings: +----------+------------+---------+-----------+----------------+-------+ LEFT      CompressiblePhasicitySpontaneous   Properties   Summary +----------+------------+---------+-----------+----------------+-------+ IJV           Full       Yes  Yes                            +----------+------------+---------+-----------+----------------+-------+ Subclavian    Full       Yes       Yes                            +----------+------------+---------+-----------+----------------+-------+ Axillary      Full       Yes       Yes                            +----------+------------+---------+-----------+----------------+-------+ Brachial      Full       Yes       Yes                            +----------+------------+---------+-----------+----------------+-------+ Radial        Full                                                +----------+------------+---------+-----------+----------------+-------+ Ulnar         Full                                                +----------+------------+---------+-----------+----------------+-------+ Cephalic      None                         softly echogenic Acute  +----------+------------+---------+-----------+----------------+-------+ Basilic       None                        softly echogenic Acute  +----------+------------+---------+-----------+----------------+-------+ Thrombus within th eLeft cephalic and basilic veins from mid humerous extending distally.  Summary:  Right: No evidence of thrombosis in the subclavian.  Left: No evidence of deep vein thrombosis in the upper extremity. Findings consistent with acute superficial vein thrombosis involving the left basilic vein and left cephalic vein.  *See table(s) above for measurements and observations.  Diagnosing physician: Servando Snare MD Electronically signed by Servando Snare MD on 10/28/2020 at 4:03:47 PM.    Final     Microbiology: No results found for this or any previous visit (from the past 240 hour(s)).   Labs: Basic Metabolic Panel: Recent Labs  Lab 11/09/20 0156 11/10/20 0359 11/11/20 0155 11/12/20 0115 11/13/20 0549 11/13/20 0807 11/14/20 0433  NA 139   < > 136 135 135 134* 134*  K 4.4   < > 4.2 4.0 4.5 4.3 4.4  CL 103   < > 102 101 97* 99 97*  CO2 26   < > _0 GLUCOSE 98   < > 156* 212* 125* 169* 193*  BUN 9   < > _1 CREATININE 1.01   < > 0.92 0.83 0.89 0.89 0.93  CALCIUM 9.0   < > 9.2 9.0 9.3 9.2 9.3  MG 2.0  --   --   --   --  1.9  --  PHOS  --   --   --   --   --  3.8  --    < > = values in this interval not displayed.   Liver Function Tests: Recent Labs  Lab 11/13/20 0807  AST 20  ALT 15  ALKPHOS 101  BILITOT 0.4  PROT 7.8  ALBUMIN 2.8*   No results for input(s): LIPASE, AMYLASE in the last 168 hours. No results for input(s): AMMONIA in the last 168 hours. CBC: Recent Labs  Lab 11/11/20 0155 11/12/20 0115 11/13/20 0549 11/13/20 0807 11/14/20 0433  WBC 6.3 5.9 5.4 5.9 6.3  HGB 10.1* 10.3* 11.7* 10.8* 10.6*  HCT 33.7* 33.2* 38.2* 35.1* 34.5*  MCV 86.9 87.4 87.2 86.0 86.5  PLT 347 333  360 320 350   Cardiac Enzymes: No results for input(s): CKTOTAL, CKMB, CKMBINDEX, TROPONINI in the last 168 hours. BNP: BNP (last 3 results) No results for input(s): BNP in the last 8760 hours.  ProBNP (last 3 results) No results for input(s): PROBNP in the last 8760 hours.  CBG: Recent Labs  Lab 11/14/20 1943 11/15/20 0021 11/15/20 0507 11/15/20 0751 11/15/20 1148  GLUCAP 148* 174* 142* 137* 189*      Time spent: 35 minutes  Signed:  Para Skeans MD.  [facility] 11/15/2020, 1:24 PM

## 2020-11-15 NOTE — H&P (Signed)
Physical Medicine and Rehabilitation Admission H&P    No chief complaint on file. : HPI: [name] is a 64 year old right-handed male with history of iron deficiency anemia, type 2 diabetes mellitus, CVA left-sided weakness and dysphagia, Barrett's esophagus, GERD with gastroparesis, hypertension, hyperlipidemia, mental retardation, schizophrenia, seizure disorder and hypothyroidism.  Per chart review patient has lived in a group home since 2012.  He does have local family to check on him routinely.  Patient ambulated modified independent.  Patient is able to dress himself and is modified independent for ADLs.  Presented 10/20/2020 after unwitnessed fall at group home facility question syncope related.  Patient subsequently had another syncopal episode with EMS reported to be pale and diaphoretic.  Admission chemistries glucose 163 BUN 37 creatinine 1.70, blood cultures no growth to date, procalcitonin 0.11, lactic acid 1.4, troponin 23, hemoglobin 8.2.  He was tachycardic with heart rate 120-130 SPO2 88% on room air CBG 255, SARS coronavirus negative.  Cranial CT scan no acute intracranial abnormality.  Large area encephalomalacia involving the right frontal lobe.  Chest x-ray without evidence of acute infarct.  CT angiogram of chest negative for pulmonary emboli.  CT abdomen pelvis showed a large stable gastric hernia with associated esophageal dilatation and subsequent mass-effect on the trachea.  Echocardiogram with ejection fraction of 70 to 75% no wall motion abnormalities grade 1 diastolic dysfunction.  EEG suggestive of mild to moderate diffuse encephalopathy no seizure.  He underwent EGD on 10/21/2020 per Dr. Carlean Purl that showed dilation in the entire esophagus.  Tortuous esophagus.  Esophageal mucosal changes secondary to established short segment Barrett's disease.  There was a 10 cm hiatal hernia.  2 gastric polyps were biopsied.  He was able to return to a regular diet.  On the  early morning of 10/22/2020 rapid response patient developed significant respiratory distress subsequently intubated transferred to the ICU under critical care service.  Patient underwent hiatal hernia repair with G-tube placement by cardiothoracic surgery Dr. Kipp Brood on 10/26/2020 change to jejunal arm via indwelling gastrostomy tube 11/07/2020 per interventional radiology.  He was extubated 11/05/2020.  During hospitalization patient developed pneumonia aspiration versus ventilatory associated pneumonia he did complete a course of Unasyn followed by Zithromax and currently oxygenating well on room air.  Subcutaneous heparin for DVT prophylaxis.  Therapy evaluations completed with recommendations of physical medicine follow-up and patient was admitted for a comprehensive rehab program.   Pt reports no pain currently- can't tell if "normal" or due to pain meds.  LBM says this AM, but just got off bedpan? Also c/o constipation.   Review of Systems  Constitutional: Negative for chills and fever.  HENT: Negative for hearing loss.   Eyes: Negative for blurred vision and double vision.  Respiratory: Positive for shortness of breath. Negative for cough and wheezing.   Cardiovascular: Negative for chest pain and leg swelling.  Gastrointestinal: Positive for constipation. Negative for heartburn, nausea and vomiting.       GERD  Genitourinary: Negative for dysuria, flank pain and hematuria.  Musculoskeletal: Positive for falls and myalgias.  Skin: Negative for rash.  Neurological: Positive for seizures.       Dysphagia  Psychiatric/Behavioral:       Schizophrenia/mental retardation  All other systems reviewed and are negative.  Past Medical History:  Diagnosis Date  . Barrett esophagus   . DM II (diabetes mellitus, type II), controlled ([place]) 11/13/2020  . Dysphagia   . Gastroparesis   . GERD (gastroesophageal reflux disease)   .  Hypercholesteremia   . Hypertension   . Iron deficiency anemia    . Mental retardation   . Schizophrenia ([place])   . Seizure [facility])    Past Surgical History:  Procedure Laterality Date  . BIOPSY  10/21/2020   Procedure: BIOPSY;  Surgeon: Gatha Mayer, MD;  Location: [place];  Service: Endoscopy;;  . CRANIOTOMY FOR CYST FENESTRATION    . ESOPHAGOGASTRODUODENOSCOPY N/A 01/09/2015   Procedure: ESOPHAGOGASTRODUODENOSCOPY (EGD);  Surgeon: Inda Castle, MD;  Location: [place];  Service: Endoscopy;  Laterality: N/A;  . ESOPHAGOGASTRODUODENOSCOPY N/A 10/26/2020   Procedure: ESOPHAGOGASTRODUODENOSCOPY (EGD);  Surgeon: Lajuana Matte, MD;  Location: [facility] OR;  Service: Thoracic;  Laterality: N/A;  . ESOPHAGOGASTRODUODENOSCOPY (EGD) WITH PROPOFOL N/A 10/21/2020   Procedure: ESOPHAGOGASTRODUODENOSCOPY (EGD) WITH PROPOFOL;  Surgeon: Gatha Mayer, MD;  Location: [place];  Service: Endoscopy;  Laterality: N/A;  . IR GASTR TUBE CONVERT GASTR-JEJ PER W/FL MOD SED  11/07/2020  . PEG PLACEMENT N/A 10/26/2020   Procedure: PERCUTANEOUS ENDOSCOPIC GASTROSTOMY (PEG) PLACEMENT;  Surgeon: Lajuana Matte, MD;  Location: [place];  Service: Thoracic;  Laterality: N/A;  . XI ROBOTIC ASSISTED HIATAL HERNIA REPAIR N/A 10/26/2020   Procedure: XI ROBOTIC ASSISTED HIATAL HERNIA REPAIR;  Surgeon: Lajuana Matte, MD;  Location: [place];  Service: Thoracic;  Laterality: N/A;   History reviewed. No pertinent family history. Social History:  reports that he has never smoked. He has never used smokeless tobacco. He reports that he does not drink alcohol and does not use drugs. Allergies:  Allergies  Allergen Reactions  . Vancomycin Other (See Comments)    Unknown-possible hives    Medications Prior to Admission  Medication Sig Dispense Refill  . acetaminophen (TYLENOL) 650 MG suppository Place 1 suppository (650 mg total) rectally every 4 (four) hours as needed for fever. 12 suppository 0  . albuterol (PROVENTIL) (2.5 MG/3ML) 0.083% nebulizer solution Take 3  mLs (2.5 mg total) by nebulization every 2 (two) hours as needed for wheezing or shortness of breath. 75 mL 12  . [START ON 11/16/2020] azithromycin (ZITHROMAX) 200 MG/5ML suspension Place 12.5 mLs (500 mg total) into feeding tube daily. 22.5 mL 0  . [START ON 11/16/2020] bacitracin ointment Apply topically daily. 120 g 0  . chlorhexidine gluconate, MEDLINE KIT, (PERIDEX) 0.12 % solution 15 mLs by Mouth Rinse route 2 (two) times daily. 120 mL 0  . Cholecalciferol (VITAMIN D) 2000 units tablet Take 2,000 Units by mouth daily.    Marland Kitchen docusate (COLACE) 50 MG/5ML liquid Place 10 mLs (100 mg total) into feeding tube 2 (two) times daily. 100 mL 0  . [START ON 11/16/2020] lamoTRIgine (LAMICTAL) 100 MG tablet Place 1 tablet (100 mg total) into feeding tube daily. 30 tablet 0  . [START ON 11/16/2020] levothyroxine (SYNTHROID) 75 MCG tablet Place 1 tablet (75 mcg total) into feeding tube daily at 6 (six) AM. 30 tablet 0  . lip balm (CARMEX) ointment Apply topically as needed for lip care. 7 g 0  . LORazepam (ATIVAN) 2 MG/ML injection Inject 0.5 mLs (1 mg total) into the vein every 6 (six) hours as needed for anxiety (aggitation). 1 mL 0  . Nutritional Supplements (FEEDING SUPPLEMENT, OSMOLITE 1.2 CAL,) LIQD Place 1,000 mLs into feeding tube continuous. 1500 mL 0  . oxyCODONE (ROXICODONE) 5 MG/5ML solution Place 7.5 mLs (7.5 mg total) into feeding tube every 6 (six) hours as needed for up to 3 days for moderate pain. 15 mL 0  . pantoprazole sodium (  PROTONIX) 40 mg/20 mL PACK Place 20 mLs (40 mg total) into feeding tube 2 (two) times daily for 10 days. 30 mL 0  . QUEtiapine (SEROQUEL) 400 MG tablet Place 1 tablet (400 mg total) into feeding tube 2 (two) times daily for 10 days. 20 tablet 0  . [START ON 11/16/2020] rosuvastatin (CRESTOR) 20 MG tablet Place 1 tablet (20 mg total) into feeding tube daily. 30 tablet 0  . valproic acid (DEPAKENE) 250 MG/5ML solution Place 5 mLs (250 mg total) into feeding tube 2 (two)  times daily. 300 mL 0  . Water For Irrigation, Sterile (FREE WATER) SOLN Place 75 mLs into feeding tube every 4 (four) hours for 10 days. 4500 mL 0    Drug Regimen Review Drug regimen was reviewed and remains appropriate with no significant issues identified  Home:     Functional History:    Functional Status:  Mobility:          ADL:    Cognition: Cognition Orientation Level: Oriented X4    Physical Exam: Blood pressure 133/75, pulse 91, temperature 98.2 F (36.8 C), resp. rate 18, SpO2 95 %. Physical Exam Vitals and nursing note reviewed.  Constitutional:      Comments: Older male appearing younger than stated age, laying in bed- focused on TV, NAD  HENT:     Head: Normocephalic and atraumatic.     Right Ear: External ear normal.     Left Ear: External ear normal.     Nose: Nose normal. No congestion.     Mouth/Throat:     Mouth: Mucous membranes are dry.     Pharynx: Oropharynx is clear. No oropharyngeal exudate.  Eyes:     General:        Right eye: No discharge.        Left eye: No discharge.     Conjunctiva/sclera: Conjunctivae normal.  Cardiovascular:     Rate and Rhythm: Normal rate and regular rhythm.     Heart sounds: Normal heart sounds.  Pulmonary:     Comments: CTA B/L- no W/R/R- good air movement  Abdominal:     Comments: (+) PEG/ J tube in place-  Soft, NT, ND hypoactive BS  Musculoskeletal:     Cervical back: Normal range of motion. No rigidity.     Comments: RUE 5/5 in deltoids, biceps, triceps, WE grip and finger abd LUE- 3-/5 in same muscles (due to previous stroke) RLE- 5/5 in HF, KE, KF, DF and PF LLE- 4-/5 in same muscles (due to previous stroke)  Skin:    General: Skin is warm and dry.     Comments: Heels aren't boggy IV R hand and L forearm- look OK abd incisions healing great- scabbed over  Neurological:     Comments: Patient is alert male in no acute distress mentally challenged and makes good eye contact with examiner.  He  provided his name and age.  He stated he was in the hospital but was not sure of the name.  Follows simple commands. Follows 1 step commands; cognitively impaired- chronic Equal smile EOMI B/L   Psychiatric:        Mood and Affect: Mood normal.        Behavior: Behavior normal.     Results for orders placed or performed during the hospital encounter of 11/15/20 (from the past 48 hour(s))  Glucose, capillary     Status: Abnormal   Collection Time: 11/15/20  4:54 PM  Result Value Ref Range  Glucose-Capillary 135 (H) 70 - 99 mg/dL    Comment: Glucose reference range applies only to samples taken after fasting for at least 8 hours.   CT ANGIO CHEST PE W OR WO CONTRAST  Result Date: 11/13/2020 CLINICAL DATA:  Respiratory failure. EXAM: CT ANGIOGRAPHY CHEST WITH CONTRAST TECHNIQUE: Multidetector CT imaging of the chest was performed using the standard protocol during bolus administration of intravenous contrast. Multiplanar CT image reconstructions and MIPs were obtained to evaluate the vascular anatomy. CONTRAST:  119m OMNIPAQUE IOHEXOL 350 MG/ML SOLN COMPARISON:  10/05/2007 FINDINGS: Cardiovascular: Contrast injection is sufficient to demonstrate satisfactory opacification of the pulmonary arteries to the segmental level. There is no pulmonary embolus or evidence of right heart strain. The size of the main pulmonary artery is normal. Heart size is normal, with no pericardial effusion. The course and caliber of the aorta are normal. There is no atherosclerotic calcification. Opacification decreased due to pulmonary arterial phase contrast bolus timing. Mediastinum/Nodes: -- No mediastinal lymphadenopathy. -- No hilar lymphadenopathy. -- No axillary lymphadenopathy. --there are few mildly enlarged bilateral supraclavicular lymph nodes of unknown clinical significance. -- Normal thyroid gland where visualized. -the esophagus is patulous and dilated. The patient appears to be status post prior hiatal  hernia repair. There is a residual small hiatal hernia. Lungs/Pleura: There is atelectasis at the lung bases bilaterally with trace bilateral pleural effusions. There is mucous plugging and bronchial wall thickening primarily at the lung bases. There is right apical pleuroparenchymal scarring. There is no pneumothorax. Upper Abdomen: Contrast bolus timing is not optimized for evaluation of the abdominal organs. The stomach is distended. There is a partially visualized gastrojejunostomy tube in place. Musculoskeletal: No chest wall abnormality. No bony spinal canal stenosis. Review of the MIP images confirms the above findings. IMPRESSION: 1. There is no evidence for acute pulmonary embolus or aortic dissection. 2. There is atelectasis at the lung bases bilaterally with trace bilateral pleural effusions. 3. There is mucous plugging and bronchial wall thickening primarily at the lung bases. Findings may be secondary to infectious or reactive bronchiolitis. 4. The esophagus is patulous and dilated. The patient appears to be status post prior hiatal hernia repair. There is a residual small hiatal hernia. 5. The stomach is distended. There is a partially visualized gastrojejunostomy tube in place. 6. Mildly enlarged bilateral supraclavicular lymph nodes of unknown clinical significance. Electronically Signed   By: CConstance HolsterM.D.   On: 11/13/2020 19:57       Medical Problem List and Plan: 1.  Decreased functional mobility secondary to acute hypoxic hypercapnic respiratory failure with aspiration/ventilatory support pneumonia  -patient may  Shower if they address PEG  -ELOS/Goals: supervision to Min A 7-10 days 2.  Antithrombotics: -DVT/anticoagulation: Subcutaneous heparin  -antiplatelet therapy: N/A 3. Pain Management: Oxycodone as needed 4. Mood: no SSRIs  -antipsychotic agents: Seroquel 400 mg twice daily 5. Neuropsych: This patient is not capable of making decisions on his own behalf. 6.  Skin/Wound Care: Routine skin checks 7. Fluids/Electrolytes/Nutrition: Routine in and outs with follow-up chemistries 8.  Paraesophageal hernia/massively dilated esophagus.  Status post hernia repair percutaneous gastrostomy tube placement 10/26/2020 per cardiothoracic surgery Dr. LKipp Broodchanged to placement of jejunal arm via indwelling gastrostomy tube 11/07/2020 per interventional radiology.  Continue Protonix twice daily, MiraLAX twice daily, Colace twice daily.  Transition to bolus tube feeds -spoke to SLP- they thought pt would benefit first from esophagram, THEN MBS 9.  Iron deficiency anemia.  Follow-up CBC 10.  Schizophrenia/intellectual delay/seizure disorder.  Valproic  acid 250 mg twice daily, Lamictal 100 mg daily 11.  Hypothyroidism.  Synthroid 12.  Type 2 diabetes mellitus.  Hemoglobin A1c 5.8.  SSI. 13.  Hyperlipidemia.  Crestor 14.  History of CVA with left-sided residual weakness continue therapies as directed  Nelva Nay, PA-C 11/15/2020   I have personally performed a face to face diagnostic evaluation of this patient and formulated the key components of the plan.  Additionally, I have personally reviewed laboratory data, imaging studies, as well as relevant notes and concur with the physician assistant's documentation above.   The patient's status has not changed from the original H&P.  Any changes in documentation from the acute care chart have been noted above.     [name], MD 11/15/2020

## 2020-11-16 ENCOUNTER — Inpatient Hospital Stay (HOSPITAL_COMMUNITY): Payer: Medicare Other

## 2020-11-16 DIAGNOSIS — F201 Disorganized schizophrenia: Secondary | ICD-10-CM | POA: Diagnosis not present

## 2020-11-16 DIAGNOSIS — R1319 Other dysphagia: Secondary | ICD-10-CM | POA: Diagnosis not present

## 2020-11-16 DIAGNOSIS — K3184 Gastroparesis: Secondary | ICD-10-CM

## 2020-11-16 DIAGNOSIS — R5381 Other malaise: Secondary | ICD-10-CM | POA: Diagnosis not present

## 2020-11-16 DIAGNOSIS — G40909 Epilepsy, unspecified, not intractable, without status epilepticus: Secondary | ICD-10-CM

## 2020-11-16 LAB — COMPREHENSIVE METABOLIC PANEL
ALT: 13 U/L (ref 0–44)
AST: 18 U/L (ref 15–41)
Albumin: 2.9 g/dL — ABNORMAL LOW (ref 3.5–5.0)
Alkaline Phosphatase: 97 U/L (ref 38–126)
Anion gap: 11 (ref 5–15)
BUN: 15 mg/dL (ref 8–23)
CO2: 23 mmol/L (ref 22–32)
Calcium: 9.2 mg/dL (ref 8.9–10.3)
Chloride: 98 mmol/L (ref 98–111)
Creatinine, Ser: 1.01 mg/dL (ref 0.61–1.24)
GFR, Estimated: >60 mL/min (ref >60)
Glucose, Bld: 188 mg/dL — ABNORMAL HIGH (ref 70–99)
Potassium: 4.5 mmol/L (ref 3.5–5.1)
Sodium: 132 mmol/L — ABNORMAL LOW (ref 135–145)
Total Bilirubin: 0.6 mg/dL (ref 0.3–1.2)
Total Protein: 8.2 g/dL — ABNORMAL HIGH (ref 6.5–8.1)

## 2020-11-16 LAB — CBC WITH DIFFERENTIAL/PLATELET
Abs Immature Granulocytes: 0.02 10*3/uL (ref 0.00–0.07)
Basophils Absolute: 0 10*3/uL (ref 0.0–0.1)
Basophils Relative: 1 %
Eosinophils Absolute: 0.3 10*3/uL (ref 0.0–0.5)
Eosinophils Relative: 6 %
HCT: 35.5 % — ABNORMAL LOW (ref 39.0–52.0)
Hemoglobin: 11.2 g/dL — ABNORMAL LOW (ref 13.0–17.0)
Immature Granulocytes: 0 %
Lymphocytes Relative: 37 %
Lymphs Abs: 2.2 10*3/uL (ref 0.7–4.0)
MCH: 27.1 pg (ref 26.0–34.0)
MCHC: 31.5 g/dL (ref 30.0–36.0)
MCV: 85.7 fL (ref 80.0–100.0)
Monocytes Absolute: 1 10*3/uL (ref 0.1–1.0)
Monocytes Relative: 16 %
Neutro Abs: 2.4 10*3/uL (ref 1.7–7.7)
Neutrophils Relative %: 40 %
Platelets: 313 10*3/uL (ref 150–400)
RBC: 4.14 MIL/uL — ABNORMAL LOW (ref 4.22–5.81)
RDW: 18.7 % — ABNORMAL HIGH (ref 11.5–15.5)
WBC: 6 10*3/uL (ref 4.0–10.5)
nRBC: 0 % (ref 0.0–0.2)

## 2020-11-16 LAB — GLUCOSE, CAPILLARY
Glucose-Capillary: 119 mg/dL — ABNORMAL HIGH (ref 70–99)
Glucose-Capillary: 148 mg/dL — ABNORMAL HIGH (ref 70–99)
Glucose-Capillary: 160 mg/dL — ABNORMAL HIGH (ref 70–99)
Glucose-Capillary: 167 mg/dL — ABNORMAL HIGH (ref 70–99)
Glucose-Capillary: 185 mg/dL — ABNORMAL HIGH (ref 70–99)

## 2020-11-16 MED ORDER — OSMOLITE 1.2 CAL PO LIQD
1000.0000 mL | ORAL | Status: DC
  Administered 2020-11-16 – 2020-11-22 (×10): 1000 mL
  Filled 2020-11-16 (×4): qty 1000

## 2020-11-16 NOTE — Evaluation (Signed)
Speech Language Pathology Assessment and Plan  Patient Details  Name: [name] MRN: [id number] Date of Birth: Nov 12, 1956  SLP Diagnosis: Dysphagia  Rehab Potential: Good ELOS: 7-10 days    Today's Date: 11/16/2020 SLP Individual Time: 5726-2035 SLP Individual Time Calculation (min): 60 min   Hospital Problem: Principal Problem:   Debility  Past Medical History:  Past Medical History:  Diagnosis Date  . Barrett esophagus   . DM II (diabetes mellitus, type II), controlled ([place]) 11/13/2020  . Dysphagia   . Gastroparesis   . GERD (gastroesophageal reflux disease)   . Hypercholesteremia   . Hypertension   . Iron deficiency anemia   . Mental retardation   . Schizophrenia ([place])   . Seizure [facility])    Past Surgical History:  Past Surgical History:  Procedure Laterality Date  . BIOPSY  10/21/2020   Procedure: BIOPSY;  Surgeon: Gatha Mayer, MD;  Location: [place];  Service: Endoscopy;;  . CRANIOTOMY FOR CYST FENESTRATION    . ESOPHAGOGASTRODUODENOSCOPY N/A 01/09/2015   Procedure: ESOPHAGOGASTRODUODENOSCOPY (EGD);  Surgeon: Inda Castle, MD;  Location: [place];  Service: Endoscopy;  Laterality: N/A;  . ESOPHAGOGASTRODUODENOSCOPY N/A 10/26/2020   Procedure: ESOPHAGOGASTRODUODENOSCOPY (EGD);  Surgeon: Lajuana Matte, MD;  Location: [facility] OR;  Service: Thoracic;  Laterality: N/A;  . ESOPHAGOGASTRODUODENOSCOPY (EGD) WITH PROPOFOL N/A 10/21/2020   Procedure: ESOPHAGOGASTRODUODENOSCOPY (EGD) WITH PROPOFOL;  Surgeon: Gatha Mayer, MD;  Location: [place];  Service: Endoscopy;  Laterality: N/A;  . IR GASTR TUBE CONVERT GASTR-JEJ PER W/FL MOD SED  11/07/2020  . PEG PLACEMENT N/A 10/26/2020   Procedure: PERCUTANEOUS ENDOSCOPIC GASTROSTOMY (PEG) PLACEMENT;  Surgeon: Lajuana Matte, MD;  Location: [place];  Service: Thoracic;  Laterality: N/A;  . XI ROBOTIC ASSISTED HIATAL HERNIA REPAIR N/A 10/26/2020   Procedure: XI ROBOTIC ASSISTED HIATAL HERNIA  REPAIR;  Surgeon: Lajuana Matte, MD;  Location: [facility] OR;  Service: Thoracic;  Laterality: N/A;    Assessment / Plan / Recommendation Clinical Impression Patient is a 64 y.o. year old male with history of iron deficiency anemia, type 2 diabetes mellitus, CVA left-sided weakness and dysphagia, Barrett's esophagus, GERD with gastroparesis, hypertension, hyperlipidemia, mental retardation, schizophrenia, seizure disorder and hypothyroidism. Per chart review patient has lived in a group home since 2012. He does have local family to check on him routinely. Patient ambulated modified independent. Patient is able to dress himself and is modified independent for ADLs. Presented 10/20/2020 after unwitnessed fall at group home facility question syncope related. Patient subsequently had another syncopal episode with EMS reported to be pale and diaphoretic. Admission chemistries glucose 163 BUN 37 creatinine 1.70, blood cultures no growth to date, procalcitonin 0.11, lactic acid 1.4, troponin 23, hemoglobin 8.2. He was tachycardic with heart rate 120-130 SPO2 88% on room air CBG 255, SARS coronavirus negative. Cranial CT scan no acute intracranial abnormality. Large area encephalomalacia involving the right frontal lobe. Chest x-ray without evidence of acute infarct. CT angiogram of chest negative for pulmonary emboli. CT abdomen pelvis showed a large stable gastric hernia with associated esophageal dilatation and subsequent mass-effect on the trachea. Echocardiogram with ejection fraction of 70 to 75% no wall motion abnormalities grade 1 diastolic dysfunction. EEG suggestive of mild to moderate diffuse encephalopathy no seizure. He underwent EGD on 10/21/2020 per Dr. Carlean Purl that showed dilation in the entire esophagus. Tortuous esophagus. Esophageal mucosal changes secondary to established short segment Barrett's disease. There was a 10 cm hiatal hernia. 2 gastric polyps were biopsied. He was  able  to return to a regular diet. On the early morning of 10/22/2020 rapid response patient developed significant respiratory distress subsequently intubated transferred to the ICU under critical care service. Patient underwent hiatal hernia repair with G-tube placement by cardiothoracic surgery Dr. Kipp Brood on 11/3/2021change to jejunal arm via indwelling gastrostomy tube 11/07/2020 per interventional radiology. He was extubated 11/05/2020. During hospitalization patient developed pneumonia aspiration versus ventilatory associated pneumonia he did complete a course of Unasyn followed by Zithromax and currently oxygenating well on room air. Subcutaneous heparin for DVT prophylaxis. Therapy evaluations completed with recommendations of physical medicine follow-up and patientis to beadmitted for a comprehensive rehab programon 11/15/20.  Pt presents with mild oropharyngeal dysphagia characterized by decreased oral and lingual manipulation of bolus and mildly delayed swallow initiation. Pt has baseline L weakness and decreased ROM of lingual, labial and mandibular musculature impacting oral phase of swallow. Pt demonstrates excessive oral movements when administered bolus, mostly repetitive labial protrusion and retraction impacting efficacy of bolus prep and transit. No anterior loss of bolus with any trials. Puree solids with trace lingual stasis after initial swallow effectively cleared with ice chip or thin liquid wash, no s/s aspiration across 4 oz trial. Thin liquids via cup and straw, both single and consecutive sips with no overt s/s aspiration or change in vocal quality. Hyolaryngeal excursion/elevation judged to be mildly reduced on palpation. Due to patients extensive GI/esophageal history, complicated hospital course and previous SLP recommendations, pt would benefit from Esophagram before initiating PO diet or completing MBS. Cont to recommend NPO status via G tube, oral care QID with PO trials  with SLP only pending results of instrumental assessment.   Recommend pt receive skilled ST to address dysphagia and potential diet advancement prior to his discharge to next level of care in order to maximize his safety, nutrition, hydration, functional independence, and communication.    Skilled Therapeutic Interventions          Oral Motor Assessment and Bedside Swallow Evaluation were completed and results reviewed with patient (please see above).    SLP Assessment  Patient will need skilled [facility] during CIR admission    Recommendations  Recommended Consults: Consider esophageal assessment SLP Diet Recommendations: NPO;Ice chips PRN after oral care Medication Administration: Via alternative means Oral Care Recommendations: Oral care QID Patient destination: [place] (LTC) Follow up Recommendations: 24 hour supervision/assistance Equipment Recommended: To be determined    SLP Frequency Total of 15 hours over 7 days of combined therapies   SLP Duration  SLP Intensity  SLP Treatment/Interventions 7-10 days  Minumum of 1-2 x/day, 30 to 90 minutes  Cueing hierarchy;Therapeutic Activities;Therapeutic Exercise;Oral motor exercises;Dysphagia/aspiration precaution training;Patient/family education    Pain Pain Assessment Pain Scale: 0-10 Pain Score: 0-No pain  Prior Functioning Type of Home: Group Home  Lives With: Other (Comment);Alone (lives in group home) Available Help at Discharge: Personal care attendant;Available 24 hours/day (assist available at group home)  SLP Evaluation Cognition Overall Cognitive Status: History of cognitive impairments - at baseline Arousal/Alertness: Awake/alert Orientation Level: Oriented to person;Oriented to place;Oriented to situation   Oral Motor Oral Motor/Sensory Function Overall Oral Motor/Sensory Function: Mild impairment Facial ROM: Reduced left Facial Symmetry: Abnormal symmetry left Facial  Strength: Reduced left Facial Sensation: Reduced left Lingual ROM: Reduced left Lingual Symmetry: Abnormal symmetry left Lingual Strength: Reduced Lingual Sensation: Reduced Motor Speech Overall Motor Speech: Impaired at baseline Respiration: Within functional limits Phonation: Low vocal intensity Resonance: Within functional limits Articulation: Impaired Level of  Impairment: Sentence Intelligibility: Intelligibility reduced Word: 75-100% accurate Phrase: 75-100% accurate Sentence: 75-100% accurate Conversation: 50-74% accurate Motor Planning: Impaired Level of Impairment: Sentence Effective Techniques: Over-articulate;Slow rate  Care Tool Care Tool Cognition Expression of Ideas and Wants Expression of Ideas and Wants: Some difficulty - exhibits some difficulty with expressing needs and ideas (e.g, some words or finishing thoughts) or speech is not clear   Understanding Verbal and Non-Verbal Content Understanding Verbal and Non-Verbal Content: Sometimes understands - understands only basic conversations or simple, direct phrases. Frequently requires cues to understand   Memory/Recall Ability *first 3 days only Memory/Recall Ability *first 3 days only: Location of own room;Staff names and faces;That he or she is in a hospital/hospital unit     Bedside Swallowing Assessment General Previous Swallow Assessment: BSE in acute care, recommended NPO with ice chips. SLP and MD discussed instrumental assessment of swallow before initiating PO diet (esophagram before MBS). Diet Prior to this Study: PEG tube;NPO Temperature Spikes Noted: No Respiratory Status: Room air History of Recent Intubation: Yes Length of Intubations (days): 14 days Date extubated: 11/05/20 Behavior/Cognition: Alert;Cooperative;Pleasant mood Oral Cavity - Dentition: Edentulous Self-Feeding Abilities: Needs set up Patient Positioning: Upright in bed Volitional Cough: Strong Volitional Swallow: Able to elicit   Oral Care Assessment Does patient have any of the following "high(er) risk" factors?: Diet - patient on tube feedings Does patient have any of the following "at risk" factors?: Other - dysphagia Patient is HIGH RISK: Non-ventilated: Order set for Adult Oral Care Protocol initiated - "High Risk Patients - Non-Ventilated" option selected  (see row information) Ice Chips Ice chips: Within functional limits Presentation: Spoon Thin Liquid Thin Liquid: Within functional limits Presentation: Cup;Straw Nectar Thick Nectar Thick Liquid: Not tested Honey Thick Honey Thick Liquid: Not tested Puree Puree: Within functional limits Presentation: Spoon Solid Solid: Not tested BSE Assessment Risk for Aspiration Impact on safety and function: Moderate aspiration risk Other Related Risk Factors: History of GERD;History of esophageal-related issues;History of dysphagia;Previous CVA;Cognitive impairment;Deconditioning;Prolonged intubation;Lethargy  Short Term Goals: Week 1: SLP Short Term Goal 1 (Week 1): Pending instrumental assessment, patient will tolerate trials of safest and least restrictive diet with no overt s/s aspiration and min A cues for use of compensatory strategies SLP Short Term Goal 2 (Week 1): Pt will tolerate PO trials with SLP only with min A and no overt s/s aspiration  Refer to Care Plan for Long Term Goals  Recommendations for other services: Other: Esophagram, GI consult  Discharge Criteria: Patient will be discharged from SLP if patient refuses treatment 3 consecutive times without medical reason, if treatment goals not met, if there is a change in medical status, if patient makes no progress towards goals or if patient is discharged from hospital.  The above assessment, treatment plan, treatment alternatives and goals were discussed and mutually agreed upon: by patient  Dewaine Conger 11/16/2020, 10:43 AM

## 2020-11-16 NOTE — Progress Notes (Signed)
[place] PHYSICAL MEDICINE & REHABILITATION PROGRESS NOTE   Subjective/Complaints: Pt says he had a reasonable night. Denies pain. Able to sleep. Had an incontinent urine episode this morning. He says he has generally been continent.   ROS: Patient denies fever, rash, sore throat, blurred vision, nausea, vomiting, diarrhea, cough, shortness of breath or chest pain, joint or back pain, headache, or mood change.    Objective:   No results found. Recent Labs    11/14/20 0433 11/16/20 0419  WBC 6.3 6.0  HGB 10.6* 11.2*  HCT 34.5* 35.5*  PLT 350 313   Recent Labs    11/14/20 0433 11/16/20 0419  NA 134* 132*  K 4.4 4.5  CL 97* 98  CO2 25 23  GLUCOSE 193* 188*  BUN 15 15  CREATININE 0.93 1.01  CALCIUM 9.3 9.2    Intake/Output Summary (Last 24 hours) at 11/16/2020 0858 Last data filed at 11/16/2020 0700 Gross per 24 hour  Intake 220 ml  Output 875 ml  Net -655 ml        Physical Exam: Vital Signs Blood pressure 118/79, pulse 88, temperature 98.4 F (36.9 C), resp. rate 18, weight 69.6 kg, SpO2 94 %. Gen: no distress, frail appearing HEENT: oral mucosa pink and moist, NCAT Cardio: Reg rate, rhythm  Chest: normal effort, normal rate of breathing Abd: soft, non-distended, PEG/J site clean, dry Ext: no edema Skin: intact, abd incisions healing Neuro: dysphonic, dysarthric speech. Oriented to person, place, month, why he was here. Provided some biographical information. Fair insight. RUE grossly 4+ to 5-/5. LUE 3- to 3/5 prox to distal. RLE 4-5/5 prox to distal. LLE 4-HF, KE and 5/5 ADF/PF. Sensed pain in all 4's. No resting tone.  Musculoskeletal: Psych: cooperative but flat    Assessment/Plan: 1. Functional deficits which require 3+ hours per day of interdisciplinary therapy in a comprehensive inpatient rehab setting.  Physiatrist is providing close team supervision and 24 hour management of active medical problems listed below.  Physiatrist and rehab team  continue to assess barriers to discharge/monitor patient progress toward functional and medical goals  Care Tool:  Bathing              Bathing assist       Upper Body Dressing/Undressing Upper body dressing        Upper body assist      Lower Body Dressing/Undressing Lower body dressing            Lower body assist       Toileting Toileting    Toileting assist Assist for toileting: Maximal Assistance - Patient 25 - 49%     Transfers Chair/bed transfer  Transfers assist           Locomotion Ambulation   Ambulation assist              Walk 10 feet activity   Assist           Walk 50 feet activity   Assist           Walk 150 feet activity   Assist           Walk 10 feet on uneven surface  activity   Assist           Wheelchair     Assist               Wheelchair 50 feet with 2 turns activity    Assist  Wheelchair 150 feet activity     Assist          Blood pressure 118/79, pulse 88, temperature 98.4 F (36.9 C), resp. rate 18, weight 69.6 kg, SpO2 94 %.  Medical Problem List and Plan: 1.  Decreased functional mobility secondary to acute hypoxic hypercapnic respiratory failure with aspiration/ventilatory support pneumonia             -patient may  Shower if they address PEG             -ELOS/Goals: supervision to Min A 7-10 days  -beginning therapies today 2.  Antithrombotics: -DVT/anticoagulation: Subcutaneous heparin             -antiplatelet therapy: N/A 3. Pain Management: Oxycodone as needed 4. Mood: no SSRIs             -antipsychotic agents: Seroquel 400 mg twice daily 5. Neuropsych: This patient is not capable of making decisions on his own behalf. 6. Skin/Wound Care: Routine skin checks 7. Fluids/Electrolytes/Nutrition: bolus feedings per below  11/24 -I personally reviewed the patient's labs today.     -mild hyponatremia. No changes to care plan  today   -recheck bmet Monday   -protein supps per PEG/J 8.  Paraesophageal hernia/massively dilated esophagus.  Status post hernia repair percutaneous gastrostomy tube placement 10/26/2020 per cardiothoracic surgery Dr. Cliffton Asters changed to placement of jejunal arm via indwelling gastrostomy tube 11/07/2020 per interventional radiology.    -Continue Protonix twice daily, MiraLAX twice daily, Colace twice daily.    -would like toTransition to bolus tube feeds at some point. Will have to tolerate Bolus feeds to return to group home  -?esophogram prior to MBS per SLP.  9.  Iron deficiency anemia.  hgb 11.2 11/24 10.  Schizophrenia/intellectual delay/seizure disorder.  Valproic acid 250 mg twice daily, Lamictal 100 mg daily, seroquel 400mg  bid 11.  Hypothyroidism.  Synthroid 12.  Type 2 diabetes mellitus.  Hemoglobin A1c 5.8.  SSI.  11/24 cbg's sl elevated   -cover with SSI for now.  13.  Hyperlipidemia.  Crestor 14.  History of CVA with left-sided residual weakness continue therapies as directed    Greater than 35 total minutes was spent in examination of patient, assessment of pertinent data,  formulation of a treatment plan, and in discussion with patient and/or family.     LOS: 1 days A FACE TO FACE EVALUATION WAS PERFORMED  12/24 11/16/2020, 8:58 AM

## 2020-11-16 NOTE — Evaluation (Signed)
Occupational Therapy Assessment and Plan  Patient Details  Name: [name] MRN: [id number] Date of Birth: Oct 31, 1956  OT Diagnosis: abnormal posture, cognitive deficits, hemiplegia affecting non-dominant side and muscle weakness (generalized) Rehab Potential: Rehab Potential (ACUTE ONLY): Good ELOS: 12-16   Today's Date: 11/16/2020 OT Individual Time: 1300-1400 OT Individual Time Calculation (min): 60 min  and Today's Date: 11/16/2020 OT Missed Time: 15 Minutes Missed Time Reason: Patient fatigue    Hospital Problem: Principal Problem:   Debility   Past Medical History:  Past Medical History:  Diagnosis Date  . Barrett esophagus   . DM II (diabetes mellitus, type II), controlled ([place]) 11/13/2020  . Dysphagia   . Gastroparesis   . GERD (gastroesophageal reflux disease)   . Hypercholesteremia   . Hypertension   . Iron deficiency anemia   . Mental retardation   . Schizophrenia ([place])   . Seizure [facility])    Past Surgical History:  Past Surgical History:  Procedure Laterality Date  . BIOPSY  10/21/2020   Procedure: BIOPSY;  Surgeon: Gatha Mayer, MD;  Location: [place];  Service: Endoscopy;;  . CRANIOTOMY FOR CYST FENESTRATION    . ESOPHAGOGASTRODUODENOSCOPY N/A 01/09/2015   Procedure: ESOPHAGOGASTRODUODENOSCOPY (EGD);  Surgeon: Inda Castle, MD;  Location: [place];  Service: Endoscopy;  Laterality: N/A;  . ESOPHAGOGASTRODUODENOSCOPY N/A 10/26/2020   Procedure: ESOPHAGOGASTRODUODENOSCOPY (EGD);  Surgeon: Lajuana Matte, MD;  [facility] OR;  Service: Thoracic;  Laterality: N/A;  . ESOPHAGOGASTRODUODENOSCOPY (EGD) WITH PROPOFOL N/A 10/21/2020   Procedure: ESOPHAGOGASTRODUODENOSCOPY (EGD) WITH PROPOFOL;  Surgeon: Gatha Mayer, MD;  Location: [place];  Service: Endoscopy;  Laterality: N/A;  . IR GASTR TUBE CONVERT GASTR-JEJ PER W/FL MOD SED  11/07/2020  . PEG PLACEMENT N/A 10/26/2020   Procedure: PERCUTANEOUS ENDOSCOPIC GASTROSTOMY (PEG)  PLACEMENT;  Surgeon: Lajuana Matte, MD;  Location: [place];  Service: Thoracic;  Laterality: N/A;  . XI ROBOTIC ASSISTED HIATAL HERNIA REPAIR N/A 10/26/2020   Procedure: XI ROBOTIC ASSISTED HIATAL HERNIA REPAIR;  Surgeon: Lajuana Matte, MD;  Location: [place];  Service: Thoracic;  Laterality: N/A;    Assessment & Plan Clinical Impression: 64 y.o male presented 10/21/20 with syncope and fall at his group home. Workup showed large hiatal hernia compressing trachea. +aspiration pnemonia; intubated 10/30; 11/03 hiatal hernia repair with PEG placement; extubated 11/12; encephalopathy; scrotal edema  PMH includes GERD, Barrett esophagus, gastroparesis, HTN, stroke with L sided deficits, craniotomy for brain cyst, schizophrenia, intellectual disability   Patient currently requires mod with basic self-care skills secondary to muscle weakness, decreased cardiorespiratoy endurance, impaired timing and sequencing, abnormal tone, unbalanced muscle activation and decreased coordination, decreased midline orientation, decreased attention to left and decreased motor planning, decreased attention, decreased awareness, decreased problem solving, decreased safety awareness and decreased memory and decreased sitting balance, decreased standing balance, decreased postural control, hemiplegia and decreased balance strategies.  Prior to hospitalization, patient could complete BADL/IADL with modified independent .  Patient will benefit from skilled intervention to decrease level of assist with basic self-care skills and increase independence with basic self-care skills prior to discharge home with care partner.  Anticipate patient will require 24 hour supervision and follow up home health.  OT - End of Session Endurance Deficit: Yes Endurance Deficit Description: required rest breaks throughout session OT Assessment Rehab Potential (ACUTE ONLY): Good OT Patient demonstrates impairments in the following area(s):  Balance;Cognition;Endurance;Motor;Pain;Perception;Nutrition;Safety;Vision;Sensory OT Basic ADL's Functional Problem(s): Grooming;Bathing;Dressing;Toileting OT Transfers Functional Problem(s): Toilet;Tub/Shower OT Plan OT Intensity: Minimum of 1-2  x/day, 45 to 90 minutes OT Frequency: 5 out of 7 days OT Duration/Estimated Length of Stay: 12-16 OT Treatment/Interventions: Discharge planning;Balance/vestibular training;Pain management;Self Care/advanced ADL retraining;Therapeutic Activities;UE/LE Coordination activities;Visual/perceptual remediation/compensation;Therapeutic Exercise;Patient/family education;Functional mobility training;Disease mangement/prevention;Cognitive remediation/compensation;Community reintegration;DME/adaptive equipment instruction;Neuromuscular re-education;Psychosocial support;UE/LE Strength taining/ROM;Wheelchair propulsion/positioning OT Self Feeding Anticipated Outcome(s): S (pending NPO status change) OT Basic Self-Care Anticipated Outcome(s): S OT Toileting Anticipated Outcome(s): S OT Bathroom Transfers Anticipated Outcome(s): S OT Recommendation Patient destination: Home Follow Up Recommendations: Home health OT Equipment Recommended: Tub/shower seat;To be determined   OT Evaluation Precautions/Restrictions  Precautions Precautions: Fall Precaution Comments: L hemiparesis, PEG Restrictions Weight Bearing Restrictions: No General OT Amount of Missed Time: 15 Minutes PT Missed Treatment Reason: Patient fatigue Family/Caregiver Present: No Vital Signs   Pain Pain Assessment Pain Scale: 0-10 Pain Score: 0-No pain Faces Pain Scale: No hurt Pain Type: Acute pain Pain Location: Abdomen Patients Stated Pain Goal: 2 Pain Intervention(s): Medication (See eMAR) Multiple Pain Sites: No Home Living/Prior Functioning Home Living Living Arrangements: Group Home Available Help at Discharge: Personal care attendant, Available 24 hours/day Type of Home: Group  Home Home Access: Level entry Home Layout: One level Bathroom Shower/Tub: Chiropodist: Standard Bathroom Accessibility: Yes Additional Comments: ramped entry, walk in shower   Lives With: Other (Comment), Alone (lives in group home) IADL History Homemaking Responsibilities: No Current License: No Leisure and Hobbies: read books Prior Function Level of Independence: Requires assistive device for independence, Independent with basic ADLs  Able to Take Stairs?: Yes Driving: Yes Comments: unsure of accurracy of pt's history; difficulty understanding Vision Baseline Vision/History: Wears glasses Wears Glasses: Reading only Patient Visual Report: No change from baseline Vision Assessment?: No apparent visual deficits Perception  Comments: per acute L attention at baseline from sister report Praxis   Cognition Overall Cognitive Status: History of cognitive impairments - at baseline Arousal/Alertness: Awake/alert Orientation Level: Person;Place;Situation Person: Oriented Place: Oriented Situation: Oriented Year: 2019 Month: November Day of Week: Incorrect Memory: Impaired Immediate Memory Recall: Sock;Blue;Bed Memory Recall Sock: Without Cue Memory Recall Blue: Without Cue Memory Recall Bed: Without Cue Awareness: Impaired Problem Solving: Impaired Safety/Judgment: Impaired Sensation Sensation Light Touch: Appears Intact Proprioception: Impaired by gross assessment Coordination Gross Motor Movements are Fluid and Coordinated: No Fine Motor Movements are Fluid and Coordinated: No Coordination and Movement Description: grossly uncoordinated due to generalized weakness, L hemiparesis, decreased balance/postural control, and poor endurance Finger Nose Finger Test: tremors on RUE; dysmetria on LUE Heel Shin Test: decreased ROM bilaterally L>R Motor  Motor Motor: Hemiplegia;Abnormal postural alignment and control Motor - Skilled Clinical Observations:  grossly uncoordinated due to generalized weakness, L hemiparesis, decreased balance/postural control, and poor endurance  Trunk/Postural Assessment  Cervical Assessment Cervical Assessment: Exceptions to [facility] (forward head) Thoracic Assessment Thoracic Assessment: Exceptions to [facility] (kyphosis) Lumbar Assessment Lumbar Assessment: Exceptions to [facility] (posterior pelvic tilt) Postural Control Postural Control: Deficits on evaluation  Balance Balance Balance Assessed: Yes Static Sitting Balance Static Sitting - Balance Support: Feet supported;Bilateral upper extremity supported Static Sitting - Level of Assistance: 5: Stand by assistance (CGA) Dynamic Sitting Balance Dynamic Sitting - Balance Support: Bilateral upper extremity supported;Feet supported Dynamic Sitting - Level of Assistance: 4: Min Insurance risk surveyor Standing - Balance Support: Bilateral upper extremity supported (RW) Static Standing - Level of Assistance: 5: Stand by assistance (CGA) Dynamic Standing Balance Dynamic Standing - Balance Support: No upper extremity supported (RW) Dynamic Standing - Level of Assistance: 3: Mod assist Extremity/Trunk Assessment RUE Assessment RUE Assessment: Exceptions  to [facility] General Strength Comments: generalzied weakness LUE Assessment LUE Assessment: Exceptions to [facility] LUE Body System: Neuro (baseline) Brunstrum levels for arm and hand: Arm;Hand Brunstrum level for arm: Stage IV Movement is deviating from synergy Brunstrum level for hand: Stage IV Movements deviating from synergies  Care Tool Care Tool Self Care Eating   Eating Assist Level: Moderate Assistance - Patient 50 - 74%    Oral Care    Oral Care Assist Level: Minimal Assistance - Patient > 75%    Bathing   Body parts bathed by patient: Left arm;Chest;Abdomen;Front perineal area;Right upper leg;Left upper leg;Face Body parts bathed by helper: Buttocks;Right arm;Right lower leg;Left lower leg   Assist  Level: Moderate Assistance - Patient 50 - 74%    Upper Body Dressing(including orthotics)   What is the patient wearing?: Pull over shirt   Assist Level: Moderate Assistance - Patient 50 - 74%    Lower Body Dressing (excluding footwear)   What is the patient wearing?: Underwear/pull up;Pants Assist for lower body dressing: Maximal Assistance - Patient 25 - 49%    Putting on/Taking off footwear   What is the patient wearing?: Non-skid slipper socks Assist for footwear: Supervision/Verbal cueing       Care Tool Toileting Toileting activity   Assist for toileting: Maximal Assistance - Patient 25 - 49%     Care Tool Bed Mobility Roll left and right activity        Sit to lying activity        Lying to sitting edge of bed activity         Care Tool Transfers Sit to stand transfer   Sit to stand assist level: Minimal Assistance - Patient > 75%    Chair/bed transfer   Chair/bed transfer assist level: Moderate Assistance - Patient 50 - 74%     Toilet transfer   Assist Level: Minimal Assistance - Patient > 75%     Care Tool Cognition Expression of Ideas and Wants Expression of Ideas and Wants: Some difficulty - exhibits some difficulty with expressing needs and ideas (e.g, some words or finishing thoughts) or speech is not clear   Understanding Verbal and Non-Verbal Content Understanding Verbal and Non-Verbal Content: Sometimes understands - understands only basic conversations or simple, direct phrases. Frequently requires cues to understand   Memory/Recall Ability *first 3 days only Memory/Recall Ability *first 3 days only: Location of own room;Staff names and faces;That he or she is in a hospital/hospital unit    Refer to Care Plan for [place] 1 OT Short Term Goal 1 (Week 1): Pt will wait to initiate transfer till after w/c brakes are locked to demo improved safety awareness wiht no VC OT Short Term Goal 2 (Week 1): Pt will transfer to  toielt wiht CGA OT Short Term Goal 3 (Week 1): Pt will don LB clothing wiht A only for balance OT Short Term Goal 4 (Week 1): Pt will complete 2/3 steps of toileting OT Short Term Goal 5 (Week 1): Pt will bathe with MIN A and no more than min VC for sequencing/thoroughness  Recommendations for other services: None    Skilled Therapeutic Intervention 1;1. Pt received in bed agreeable to OT after edu re OT role/purpose, CIR, ELOS and POC. Pt completes BADL at sink level and toileting with grab bar as stated below. Pt presents with significant fatigue and decreased motivation to fully participate in ADLs, however with encouragement pt agreeable to participating fully  in ADL. Pt completes visual scanning task at [facility] and bell cancellation test. Pt with 11 misses on bell cancellation test as well as well as 3 extra selections likely because of tremors in RUE at end ROM. Exited session with pt seated in bed, exit alarm on and call ligth in reach  ADL ADL Grooming: Minimal assistance Where Assessed-Grooming: Sitting at sink Upper Body Bathing: Minimal assistance Where Assessed-Upper Body Bathing: Sitting at sink Lower Body Bathing: Moderate assistance Where Assessed-Lower Body Bathing: Sitting at sink;Standing at sink Upper Body Dressing: Moderate assistance Where Assessed-Upper Body Dressing: Sitting at sink Lower Body Dressing: Maximal assistance Where Assessed-Lower Body Dressing: Standing at sink;Sitting at sink Toileting: Maximal assistance Where Assessed-Toileting: Glass blower/designer: Minimal Print production planner Method: Stand Ecologist: Grab bars Mobility  Bed Mobility Bed Mobility: Rolling Right;Rolling Left;Sit to Supine;Supine to Sit Rolling Right: Supervision/verbal cueing Rolling Left: Supervision/Verbal cueing Supine to Sit: Contact Guard/Touching assist Sit to Supine: Contact Guard/Touching assist Transfers Sit to Stand: Minimal Assistance -  Patient > 75% Stand to Sit: Minimal Assistance - Patient > 75%   Discharge Criteria: Patient will be discharged from OT if patient refuses treatment 3 consecutive times without medical reason, if treatment goals not met, if there is a change in medical status, if patient makes no progress towards goals or if patient is discharged from hospital.  The above assessment, treatment plan, treatment alternatives and goals were discussed and mutually agreed upon: by patient  Tonny Branch 11/16/2020, 2:11 PM

## 2020-11-16 NOTE — Evaluation (Signed)
Physical Therapy Assessment and Plan  Patient Details  Name: [name] MRN: [id number] Date of Birth: October 10, 1956  PT Diagnosis: Abnormal posture, Abnormality of gait, Ataxia, Ataxic gait, Cognitive deficits, Coordination disorder, Difficulty walking, Hemiparesis non-dominant, Impaired cognition and Muscle weakness Rehab Potential: Good ELOS: 14-18 days   Today's Date: 11/16/2020 PT Individual Time: 0800-0905 PT Individual Time Calculation (min): 65 min  and Today's Date: 11/16/2020 PT Missed Time: 10 Minutes Missed Time Reason: Patient fatigue   Hospital Problem: Principal Problem:   Debility   Past Medical History:  Past Medical History:  Diagnosis Date  . Barrett esophagus   . DM II (diabetes mellitus, type II), controlled ([place]) 11/13/2020  . Dysphagia   . Gastroparesis   . GERD (gastroesophageal reflux disease)   . Hypercholesteremia   . Hypertension   . Iron deficiency anemia   . Mental retardation   . Schizophrenia ([place])   . Seizure [facility])    Past Surgical History:  Past Surgical History:  Procedure Laterality Date  . BIOPSY  10/21/2020   Procedure: BIOPSY;  Surgeon: Gatha Mayer, MD;  Location: [place];  Service: Endoscopy;;  . CRANIOTOMY FOR CYST FENESTRATION    . ESOPHAGOGASTRODUODENOSCOPY N/A 01/09/2015   Procedure: ESOPHAGOGASTRODUODENOSCOPY (EGD);  Surgeon: Inda Castle, MD;  Location: [place];  Service: Endoscopy;  Laterality: N/A;  . ESOPHAGOGASTRODUODENOSCOPY N/A 10/26/2020   Procedure: ESOPHAGOGASTRODUODENOSCOPY (EGD);  Surgeon: Lajuana Matte, MD;  Location: [facility] OR;  Service: Thoracic;  Laterality: N/A;  . ESOPHAGOGASTRODUODENOSCOPY (EGD) WITH PROPOFOL N/A 10/21/2020   Procedure: ESOPHAGOGASTRODUODENOSCOPY (EGD) WITH PROPOFOL;  Surgeon: Gatha Mayer, MD;  Location: [place];  Service: Endoscopy;  Laterality: N/A;  . IR GASTR TUBE CONVERT GASTR-JEJ PER W/FL MOD SED  11/07/2020  . PEG PLACEMENT N/A 10/26/2020    Procedure: PERCUTANEOUS ENDOSCOPIC GASTROSTOMY (PEG) PLACEMENT;  Surgeon: Lajuana Matte, MD;  Location: [place];  Service: Thoracic;  Laterality: N/A;  . XI ROBOTIC ASSISTED HIATAL HERNIA REPAIR N/A 10/26/2020   Procedure: XI ROBOTIC ASSISTED HIATAL HERNIA REPAIR;  Surgeon: Lajuana Matte, MD;  Location: [facility] OR;  Service: Thoracic;  Laterality: N/A;    Assessment & Plan Clinical Impression: Patient is a 64 y.o. year old male with history of iron deficiency anemia, type 2 diabetes mellitus, CVA left-sided weakness and dysphagia, Barrett's esophagus, GERD with gastroparesis, hypertension, hyperlipidemia, mental retardation, schizophrenia, seizure disorder and hypothyroidism.  Per chart review patient has lived in a group home since 2012.  He does have local family to check on him routinely.  Patient ambulated modified independent.  Patient is able to dress himself and is modified independent for ADLs.  Presented 10/20/2020 after unwitnessed fall at group home facility question syncope related.  Patient subsequently had another syncopal episode with EMS reported to be pale and diaphoretic.  Admission chemistries glucose 163 BUN 37 creatinine 1.70, blood cultures no growth to date, procalcitonin 0.11, lactic acid 1.4, troponin 23, hemoglobin 8.2.  He was tachycardic with heart rate 120-130 SPO2 88% on room air CBG 255, SARS coronavirus negative.  Cranial CT scan no acute intracranial abnormality.  Large area encephalomalacia involving the right frontal lobe.  Chest x-ray without evidence of acute infarct.  CT angiogram of chest negative for pulmonary emboli.  CT abdomen pelvis showed a large stable gastric hernia with associated esophageal dilatation and subsequent mass-effect on the trachea.  Echocardiogram with ejection fraction of 70 to 75% no wall motion abnormalities grade 1 diastolic dysfunction.  EEG suggestive of mild to  moderate diffuse encephalopathy no seizure.  He underwent EGD on 10/21/2020  per Dr. Carlean Purl that showed dilation in the entire esophagus.  Tortuous esophagus.  Esophageal mucosal changes secondary to established short segment Barrett's disease.  There was a 10 cm hiatal hernia.  2 gastric polyps were biopsied.  He was able to return to a regular diet.  On the early morning of 10/22/2020 rapid response patient developed significant respiratory distress subsequently intubated transferred to the ICU under critical care service.  Patient underwent hiatal hernia repair with G-tube placement by cardiothoracic surgery Dr. Kipp Brood on 10/26/2020 change to jejunal arm via indwelling gastrostomy tube 11/07/2020 per interventional radiology.  He was extubated 11/05/2020.  During hospitalization patient developed pneumonia aspiration versus ventilatory associated pneumonia he did complete a course of Unasyn followed by Zithromax and currently oxygenating well on room air.  Subcutaneous heparin for DVT prophylaxis.  Therapy evaluations completed with recommendations of physical medicine follow-up and patient is to be admitted for a comprehensive rehab program on 11/15/20.  Patient currently requires mod with mobility secondary to muscle weakness, decreased cardiorespiratoy endurance, impaired timing and sequencing, abnormal tone, unbalanced muscle activation, ataxia, decreased coordination and decreased motor planning, decreased awareness, decreased problem solving, decreased safety awareness and decreased memory and decreased sitting balance, decreased standing balance, decreased postural control, hemiplegia and decreased balance strategies.  Prior to hospitalization, patient was modified independent  with mobility and lived with Other (Comment), Alone (lives in group home) in a [place] home.  Home access is  Level entry.  Patient will benefit from skilled PT intervention to maximize safe functional mobility, minimize fall risk and decrease caregiver burden for planned discharge home with 24  hour assist.  Anticipate patient will benefit from follow up [facility] at discharge.  PT - End of Session Activity Tolerance: Tolerates 30+ min activity with multiple rests Endurance Deficit: Yes Endurance Deficit Description: required rest breaks throughout session PT Assessment Rehab Potential (ACUTE/IP ONLY): Good PT Barriers to Discharge: Decreased caregiver support;Home environment access/layout;Lack of/limited family support PT Barriers to Discharge Comments: unsure of amount of assist available upon D/C PT Patient demonstrates impairments in the following area(s): Balance;Endurance;Motor;Perception;Safety PT Transfers Functional Problem(s): Bed Mobility;Bed to Chair;Car;Furniture PT Locomotion Functional Problem(s): Ambulation;Wheelchair Mobility;Stairs PT Plan PT Intensity: Minimum of 1-2 x/day ,45 to 90 minutes PT Frequency: 5 out of 7 days PT Duration Estimated Length of Stay: 14-18 days PT Treatment/Interventions: Ambulation/gait training;Discharge planning;Functional mobility training;Psychosocial support;Therapeutic Activities;Balance/vestibular training;Disease management/prevention;Neuromuscular re-education;Therapeutic Exercise;Skin care/wound management;Wheelchair propulsion/positioning;Cognitive remediation/compensation;DME/adaptive equipment instruction;Pain management;Splinting/orthotics;UE/LE Strength taining/ROM;Community reintegration;Functional electrical stimulation;Patient/family education;Stair training;UE/LE Coordination activities PT Transfers Anticipated Outcome(s): supervision with LRAD PT Locomotion Anticipated Outcome(s): supervision with LRAD PT Recommendation Follow Up Recommendations: Home health PT Patient destination:  (group home) Equipment Recommended: To be determined Equipment Details: has RW   PT Evaluation Precautions/Restrictions Precautions Precautions: Fall Precaution Comments: L hemiparesis, PEG Restrictions Weight Bearing Restrictions:  No Home Living/Prior Functioning Home Living Available Help at Discharge: Personal care attendant;Available 24 hours/day Type of Home: Group Home Home Access: Level entry Home Layout: One level Bathroom Shower/Tub: Chiropodist: Standard Bathroom Accessibility: Yes  Lives With: Other (Comment);Alone (lives in group home) Prior Function Level of Independence: Requires assistive device for independence;Independent with basic ADLs  Able to Take Stairs?: Yes Driving: Yes Comments: unsure of accurracy of pt's history; difficulty understanding Cognition Overall Cognitive Status: History of cognitive impairments - at baseline Arousal/Alertness: Awake/alert Orientation Level: Oriented to person;Oriented to place;Oriented to situation Memory: Impaired Awareness: Impaired Problem Solving: Impaired  Safety/Judgment: Impaired Sensation Sensation Light Touch: Appears Intact Proprioception: Impaired by gross assessment Coordination Gross Motor Movements are Fluid and Coordinated: No Fine Motor Movements are Fluid and Coordinated: No Coordination and Movement Description: grossly uncoordinated due to generalized weakness, L hemiparesis, decreased balance/postural control, and poor endurance Finger Nose Finger Test: tremors on RUE; dysmetria on LUE Heel Shin Test: decreased ROM bilaterally L>R Motor  Motor Motor: Hemiplegia;Abnormal postural alignment and control Motor - Skilled Clinical Observations: grossly uncoordinated due to generalized weakness, L hemiparesis, decreased balance/postural control, and poor endurance  Trunk/Postural Assessment  Cervical Assessment Cervical Assessment: Exceptions to [facility] (forward head) Thoracic Assessment Thoracic Assessment: Exceptions to [facility] (kyphosis) Lumbar Assessment Lumbar Assessment: Exceptions to [facility] (posterior pelvic tilt) Postural Control Postural Control: Deficits on evaluation  Balance Balance Balance Assessed:  Yes Static Sitting Balance Static Sitting - Balance Support: Feet supported;Bilateral upper extremity supported Static Sitting - Level of Assistance: 5: Stand by assistance (CGA) Dynamic Sitting Balance Dynamic Sitting - Balance Support: Bilateral upper extremity supported;Feet supported Dynamic Sitting - Level of Assistance: 4: Min Insurance risk surveyor Standing - Balance Support: Bilateral upper extremity supported (RW) Static Standing - Level of Assistance: 5: Stand by assistance (CGA) Dynamic Standing Balance Dynamic Standing - Balance Support: No upper extremity supported (RW) Dynamic Standing - Level of Assistance: 3: Mod assist Extremity Assessment  RLE Assessment RLE Assessment: Exceptions to [facility] General Strength Comments: grossly generalized to 4-/5 LLE Assessment LLE Assessment: Exceptions to [facility] General Strength Comments: grossly generalized to 3+/5 (except knee flexion, DF, and PF 3/5)  Care Tool Care Tool Bed Mobility Roll left and right activity   Roll left and right assist level: Supervision/Verbal cueing    Sit to lying activity   Sit to lying assist level: Contact Guard/Touching assist    Lying to sitting edge of bed activity   Lying to sitting edge of bed assist level: Contact Guard/Touching assist     Care Tool Transfers Sit to stand transfer   Sit to stand assist level: Minimal Assistance - Patient > 75%    Chair/bed transfer   Chair/bed transfer assist level: Moderate Assistance - Patient 50 - 74%     Physiological scientist transfer assist level: Moderate Assistance - Patient 50 - 74%      Care Tool Locomotion Ambulation   Assist level: Moderate Assistance - Patient 50 - 74% Assistive device: Walker-rolling Max distance: 11f  Walk 10 feet activity   Assist level: Moderate Assistance - Patient - 50 - 74% Assistive device: Walker-rolling   Walk 50 feet with 2 turns activity Walk 50 feet with 2 turns  activity did not occur: Safety/medical concerns (fatigue, weakness, decreased balance/postural control)      Walk 150 feet activity Walk 150 feet activity did not occur: Safety/medical concerns (fatigue, weakness, decreased balance/postural control)      Walk 10 feet on uneven surfaces activity Walk 10 feet on uneven surfaces activity did not occur: Safety/medical concerns (fatigue, weakness, decreased balance/postural control, poor safety awareness)      Stairs Stair activity did not occur: Safety/medical concerns (fatigue, weakness, decreased balance/postural control, poor safety awareness)        Walk up/down 1 step activity Walk up/down 1 step or curb (drop down) activity did not occur: Safety/medical concerns (fatigue, weakness, decreased balance/postural control, poor safety awareness)     Walk up/down 4 steps activity did not occuR: Safety/medical concerns (fatigue, weakness, decreased  balance/postural control, poor safety awareness)  Walk up/down 4 steps activity      Walk up/down 12 steps activity Walk up/down 12 steps activity did not occur: Safety/medical concerns (fatigue, weakness, decreased balance/postural control, poor safety awareness)      Pick up small objects from floor Pick up small object from the floor (from standing position) activity did not occur: Safety/medical concerns (weakness, decreased balance/postural control, poor safety awareness)      Wheelchair Will patient use wheelchair at discharge?: Yes Type of Wheelchair: Manual Wheelchair activity did not occur: Refused Wheelchair assist level: Dependent - Patient 0%    Wheel 50 feet with 2 turns activity   Assist Level: Dependent - Patient 0%  Wheel 150 feet activity   Assist Level: Dependent - Patient 0%    Refer to Care Plan for Long Term Goals  SHORT TERM GOAL WEEK 1 PT Short Term Goal 1 (Week 1): pt will transfer bed<>chair with LRAD and CGA PT Short Term Goal 2 (Week 1): Pt will ambulate 13ft  with LRAD and CGA PT Short Term Goal 3 (Week 1): Pt will perform simulated car transfer with LRAD and min A  Recommendations for other services: None   Skilled Therapeutic Intervention Evaluation completed (see details above and below) with education on PT POC and goals and individual treatment initiated with focus on functional mobility/transfers, generalized strengthening, dynamic standing balance/coordination, ambulation, simulated car transfers, and improved activity tolerance. Received pt supine in bed with NT present changing brief, PT assisted with care. Pt educated on PT evaluation, CIR policies, and therapy schedule and agreeable. Pt denied any pain during session although noted pt with dysarthria. Therapist located 18x16 WC, cushion, RW, and clothing for pt and RN present to disconnect feeding tube. Pt transferred supine<>sitting EOB with CGA and required min A fading to supervision for sitting balance. Donned scrub top with max A and scrub pants with max A sitting EOB and pt with posterior lean while dressing requiring min A for correction. Pt transferred sit<>stand with RW and min A but noted pt's feeding tube leaking and brief soiled. RN notified and NT present to close feeding tube. Doffed dirty brief and donned clean one with total A while pt standing with RW. Pt required max A to pull pants over hips and transferred bed<>WC stand<>pivot with RW and mod A. Noted pt with mild impulsivity and poor safety awareness. Encouraged pt to attempt WC mobility however pt continued to request therapist push him. Pt transported to ortho gym in [facility] dependently and performed simulated car transfer with RW and mod A. Pt impulsive and with poor RW safety awareness and required max cues for turning technique. Pt ambulated 32ft x 1 and 39ft x 1 with RW and mod A. Pt required hand over hand assist to maintain LUE on RW handgrip. Pt demonstrated narrow BOS, mild ataxia, decreased stride length and step length, and  decreased trunk rotation. Pt with 1 LOB to R requring heavy mod A to correct. Therapist provided pt with L hand orthosis for RW for improved positioning. Pt transported back to room in [facility] total A and reported need to urinate. Due to urgency pt transferred sit<>stand with RW and min A and voided in urinal with total A to place urinal. Pt requested to return to bed and ambulated additional 23ft with RW and mod A back to bed. Pt with poor eccentric control flopping onto bed and transferred sit<>supine with CGA. Concluded session with pt supine in bed, needs within  reach, and bed alarm on. Safety plan updated. Pt asleep before therapist left room. 10 minutes missed of skilled physical therapy due to fatigue.   Mobility Bed Mobility Bed Mobility: Rolling Right;Rolling Left;Sit to Supine;Supine to Sit Rolling Right: Supervision/verbal cueing Rolling Left: Supervision/Verbal cueing Supine to Sit: Contact Guard/Touching assist Sit to Supine: Contact Guard/Touching assist Transfers Transfers: Sit to Stand;Stand to Sit;Stand Pivot Transfers Sit to Stand: Minimal Assistance - Patient > 75% Stand to Sit: Minimal Assistance - Patient > 75% Stand Pivot Transfers: Moderate Assistance - Patient 50 - 74% Stand Pivot Transfer Details: Verbal cues for sequencing;Verbal cues for technique;Verbal cues for precautions/safety Stand Pivot Transfer Details (indicate cue type and reason): verbal cues for technique and RW safety Transfer (Assistive device): Rolling walker Locomotion  Gait Ambulation: Yes Gait Assistance: Moderate Assistance - Patient 50-74% Gait Distance (Feet): 12 Feet Assistive device: Rolling walker Gait Assistance Details: Verbal cues for sequencing;Verbal cues for technique;Verbal cues for precautions/safety Gait Assistance Details: verbal cues for RW safety and technique Gait Gait: Yes Gait Pattern: Impaired Gait Pattern: Decreased trunk rotation;Decreased stride length;Decreased stance time -  left;Decreased step length - right;Decreased step length - left;Ataxic;Trunk flexed;Poor foot clearance - left;Poor foot clearance - right;Narrow base of support Gait velocity: decreased   Discharge Criteria: Patient will be discharged from PT if patient refuses treatment 3 consecutive times without medical reason, if treatment goals not met, if there is a change in medical status, if patient makes no progress towards goals or if patient is discharged from hospital.  The above assessment, treatment plan, treatment alternatives and goals were discussed and mutually agreed upon: by patient  Alfonse Alpers PT, DPT  11/16/2020, 12:13 PM

## 2020-11-16 NOTE — Progress Notes (Signed)
Inpatient Rehabilitation  Patient information reviewed and entered into eRehab system by Grisel Blumenstock M. Jabaree Mercado, M.A., CCC/SLP, PPS Coordinator.  Information including medical coding, functional ability and quality indicators will be reviewed and updated through discharge.    

## 2020-11-16 NOTE — Progress Notes (Addendum)
Initial Nutrition Assessment  RD working remotely.  DOCUMENTATION CODES:   Not applicable  INTERVENTION:   Continue tube feeds via J-port: - Osmolite 1.2 @ 85 ml/hr (tube feeds can be held for up to 4 hours for therapies) - Free water flushes of 75 ml q 6 hours  Tube feeding regimen provides 2040 kcal, 94 grams of protein, and 1394 ml of H2O.   Total free water with flushes: 1844 ml  ADDENDUM (1336): If group home is able to accept pt on gravity feedings via J-port, RD can provide recommendations for gravity feeds for outpatient use. Recommend continuing with continuous feeds during CIR admission.  NUTRITION DIAGNOSIS:   Inadequate oral intake related to dysphagia as evidenced by NPO status.  GOAL:   Patient will meet greater than or equal to 90% of their needs  MONITOR:   Diet advancement, TF tolerance, Labs, Weight trends  REASON FOR ASSESSMENT:   Consult Enteral/tube feeding initiation and management  ASSESSMENT:   64 year old male with PMH of iron deficiency anemia, T2DM, CVA, dysphagia, Barrett's esophagus, GERD, gastroparesis, HTN, HLD, intellectual disability residing at a group home, schizophrenia, seizure disorder, and hypothyroidism. Presented 10/20/20 after a fall. CT abdomen pelvis showed a large stable gastric hernia with associated esophageal dilatation and subsequent mass-effect on the trachea. Pt underwent EGD on 10/29 that showed dilation in the entire esophagus. On 10/30, pt developed significant respiratory distress and required intubation. Pt underwent hiatal hernia repair with G-tube placement by cardiothoracic surgery on 11/03. A jejunal arm was added to indwelling G-tube on 11/15 by IR. Pt was extubated 11/13. Pt remains NPO. Admitted to CIR on 11/23.   RD consulted for TF initiation and management. PA requesting tube feeds be changed to bolus regimen. Pt is receiving tube feeds via jejunal arm that was added on 11/15 to his indwelling G-tube. Bolus  feeds into the jejunum are contraindicated as they can cause bloating, diarrhea, and a dumping type syndrome. RD has communicated this with PA.  Of note, pt was unable to tolerate continuous gastric feeds via NG tube and via G-tube during acute admission due to severe gastroparesis. If pt unable to tolerate continuous gastric feeds, suspect pt will be unable to tolerate bolus gastric feeds. Do not recommend bolus feeds via G-tube at this time. RD has communicated this recommendation with MD. However, if this route of providing nutrition support is pursued, would recommend a bolus of 1 carton of Osmolite 1.5 cal formula 6 times daily with ProSource TF 45 ml daily to provide 2173 total kcal and 100 grams of protein, meeting 100% of pt's estimated needs.  Will adjust continuous TF regimen so that TF can be held for up to 4 hours for therapies.  Unable to communicate with pt at this time. Will attempt to obtain diet and weight history at follow-up.  Reviewed weight history in chart. Pt with a 7.5 kg weight loss since 09/07/20. This is a 9.7% weight loss in less than 3 months which is significant for timeframe. Pt is at risk for malnutrition but RD unable to identify without NFPE.  Current TF: Osmolite 1.2 @ 75 ml/hr, free water 75 ml q 4 hours  Medications reviewed and include: colace, SSI q 4 hours, lamictal, protonix, miralax  Labs reviewed: sodium 132 CBG's: 135-189 x 24 hours  UOP: 875 ml x 24 hours  NUTRITION - FOCUSED PHYSICAL EXAM:  Unable to complete at this time. RD working remotely.  Diet Order:   Diet Order  Diet NPO time specified Except for: Ice Chips  Diet effective now                 EDUCATION NEEDS:   No education needs have been identified at this time  Skin:  Skin Assessment: Skin Integrity Issues: Incisions: abdomen  Last BM:  11/15/20  Height:   Ht Readings from Last 1 Encounters:  10/27/20 5\' 4"  (1.626 m)    Weight:   Wt Readings from Last  1 Encounters:  11/16/20 69.6 kg    BMI:  Body mass index is 26.34 kg/m.  Estimated Nutritional Needs:   Kcal:  1900-2100  Protein:  95-110 grams  Fluid:  >/= 1.9 L    11/18/20, MS, RD, LDN Inpatient Clinical Dietitian Please see [facility] for contact information.

## 2020-11-16 NOTE — Progress Notes (Signed)
Patient Details  Name: [name] MRN: [id number] Date of Birth: 1956/09/24  Today's Date: 11/16/2020  Hospital Problems: Principal Problem:   Debility  Past Medical History:  Past Medical History:  Diagnosis Date  . Barrett esophagus   . DM II (diabetes mellitus, type II), controlled (HCC) 11/13/2020  . Dysphagia   . Gastroparesis   . GERD (gastroesophageal reflux disease)   . Hypercholesteremia   . Hypertension   . Iron deficiency anemia   . Mental retardation   . Schizophrenia (HCC)   . Seizure [facility])    Past Surgical History:  Past Surgical History:  Procedure Laterality Date  . BIOPSY  10/21/2020   Procedure: BIOPSY;  Surgeon: Iva Boop, MD;  Location: [facility] ENDOSCOPY;  Service: Endoscopy;;  . CRANIOTOMY FOR CYST FENESTRATION    . ESOPHAGOGASTRODUODENOSCOPY N/A 01/09/2015   Procedure: ESOPHAGOGASTRODUODENOSCOPY (EGD);  Surgeon: Louis Meckel, MD;  Location: [facility] ENDOSCOPY;  Service: Endoscopy;  Laterality: N/A;  . ESOPHAGOGASTRODUODENOSCOPY N/A 10/26/2020   Procedure: ESOPHAGOGASTRODUODENOSCOPY (EGD);  Surgeon: Corliss Skains, MD;  Location: [facility] OR;  Service: Thoracic;  Laterality: N/A;  . ESOPHAGOGASTRODUODENOSCOPY (EGD) WITH PROPOFOL N/A 10/21/2020   Procedure: ESOPHAGOGASTRODUODENOSCOPY (EGD) WITH PROPOFOL;  Surgeon: Iva Boop, MD;  Location: [facility] ENDOSCOPY;  Service: Endoscopy;  Laterality: N/A;  . IR GASTR TUBE CONVERT GASTR-JEJ PER W/FL MOD SED  11/07/2020  . PEG PLACEMENT N/A 10/26/2020   Procedure: PERCUTANEOUS ENDOSCOPIC GASTROSTOMY (PEG) PLACEMENT;  Surgeon: Corliss Skains, MD;  Location: [facility] OR;  Service: Thoracic;  Laterality: N/A;  . XI ROBOTIC ASSISTED HIATAL HERNIA REPAIR N/A 10/26/2020   Procedure: XI ROBOTIC ASSISTED HIATAL HERNIA REPAIR;  Surgeon: Corliss Skains, MD;  Location: [facility] OR;  Service: Thoracic;  Laterality: N/A;   Social History:  reports that he has never smoked. He has never used smokeless tobacco. He reports  that he does not drink alcohol and does not use drugs.  Family / Support Systems Marital Status: Single Spouse/Significant Other: N/A Children: no children Other Supports: sister [name] who lives here in [place] Anticipated Caregiver: N/A Ability/Limitations of Caregiver: N/A- pt lives in group home setting Caregiver Availability: 24/7 Family Dynamics: Pt lives in group home setting- [facility] in [place]- G2 housind development  Social History Preferred language: English Religion: Presbyterian Cultural Background: Pt held several jobs in past Education: high school grad Read: Yes Write: Yes Employment Status: Disabled Marine scientist Issues: N/A Guardian/Conservator: N/A   Abuse/Neglect Abuse/Neglect Assessment Can Be Completed: Unable to assess, patient is non-responsive or altered mental status  Emotional Status Pt's affect, behavior and adjustment status: Pt in good spirits at time of visit. Recent Psychosocial Issues: None reported; psych hx Psychiatric History: Hx of mild IDD, schizophrenia, and bipolar d/o. Substance Abuse History: Pt sister [name] declines. She recalls an incident when pt was in his 30s when he had an additiciton to steroids and this is when his mental health srurfaced. Otherwise, not aware of any current issues.  Patient / Family Perceptions, Expectations & Goals Pt/Family understanding of illness & functional limitations: Pt sister has a general understanding of patient care needs Premorbid pt/family roles/activities: Independent Anticipated changes in roles/activities/participation: Assistance with ADLs/IADLs  Recruitment consultant Agencies: None Premorbid Home Care/DME Agencies: None Transportation available at discharge: TBD Resource referrals recommended: Neuropsychology  Discharge Planning Living Arrangements: Group Home Support Systems: Other relatives, Home care staff Type of Residence: Group home Care Facility Name:  [facility]- G2  Proofreader) Insurance Resources: [employer] (specify county), [employer]  Resources: SSD Financial Screen Referred: No Living Expenses: Other (Comment) (managed by [facility]) Money Management: Other (Comment) ([facility] manages) Does the patient have any problems obtaining your medications?: No Home Management: Lives in group home setting Care Coordinator Anticipated Follow Up Needs: Other (comment) (Group Home)  Clinical Impression SW completed assessment with pt sister [name](POA). Pt lives in a group home setting managed by [facility] in which he shares living quarters with 4 other men. There is aide staff that provider 24/7 supervision. During the day he goes to their day center in [place] for activities, and to meet medical providers. [facility] manages all medication/finances. Pt is not a Cytogeneticist. No DME.  Pt sister encouraged concrete questions versus open ended questions because pt is not likely to give an appropriate answer.   Hortence Charter A Ellee Wawrzyniak 11/16/2020, 1:48 PM

## 2020-11-16 NOTE — IPOC Note (Signed)
Overall Plan of Care [facility]) Patient Details Name: [name] MRN: [id number] DOB: 12-Apr-1956  Admitting Diagnosis: Debility  Hospital Problems: Principal Problem:   Debility     Functional Problem List: Nursing Bladder, Endurance, Medication Management, Nutrition, Safety  PT Balance, Endurance, Motor, Perception, Safety  OT Balance, Cognition, Endurance, Motor, Pain, Perception, Nutrition, Safety, Vision, Sensory  SLP Cognition, Nutrition  TR         Basic ADL's: OT Grooming, Bathing, Dressing, Toileting     Advanced  ADL's: OT       Transfers: PT Bed Mobility, Bed to Chair, Car, Occupational psychologist, Research scientist (life sciences): PT Ambulation, Psychologist, prison and probation services, Stairs     Additional Impairments: OT    SLP Swallowing      TR      Anticipated Outcomes Item Anticipated Outcome  Self Feeding S (pending NPO status change)  Swallowing  supervision   Basic self-care  S  Toileting  S   Bathroom Transfers S  Bowel/Bladder  Manage bladder w/ min assist  Transfers  supervision with LRAD  Locomotion  supervision with LRAD  Communication     Cognition     Pain  N/A  Safety/Judgment  Manage safety w/ cues and reminders   Therapy Plan: PT Intensity: Minimum of 1-2 x/day ,45 to 90 minutes PT Frequency: 5 out of 7 days PT Duration Estimated Length of Stay: 14-18 days OT Intensity: Minimum of 1-2 x/day, 45 to 90 minutes OT Frequency: 5 out of 7 days OT Duration/Estimated Length of Stay: 12-16 SLP Intensity: Minumum of 1-2 x/day, 30 to 90 minutes SLP Frequency: Total of 15 hours over 7 days of combined therapies SLP Duration/Estimated Length of Stay: 7-10 days   Due to the current state of emergency, patients may not be receiving their 3-hours of Medicare-mandated therapy.   Team Interventions: Nursing Interventions Patient/Family Education, Bladder Management, Medication Management, Disease Management/Prevention, Discharge Planning,  Dysphagia/Aspiration Precaution Training  PT interventions Ambulation/gait training, Discharge planning, Functional mobility training, Psychosocial support, Therapeutic Activities, Balance/vestibular training, Disease management/prevention, Neuromuscular re-education, Therapeutic Exercise, Skin care/wound management, Wheelchair propulsion/positioning, Cognitive remediation/compensation, DME/adaptive equipment instruction, Pain management, Splinting/orthotics, UE/LE Strength taining/ROM, Community reintegration, Development worker, international aid stimulation, Patient/family education, Museum/gallery curator, UE/LE Coordination activities  OT Interventions Discharge planning, Warden/ranger, Pain management, Self Care/advanced ADL retraining, Therapeutic Activities, UE/LE Coordination activities, Visual/perceptual remediation/compensation, Therapeutic Exercise, Patient/family education, Functional mobility training, Disease mangement/prevention, Cognitive remediation/compensation, Firefighter, Fish farm manager, Neuromuscular re-education, Psychosocial support, UE/LE Strength taining/ROM, Wheelchair propulsion/positioning  SLP Interventions Cueing hierarchy, Therapeutic Activities, Therapeutic Exercise, Oral motor exercises, Dysphagia/aspiration precaution training, Patient/family education  TR Interventions    SW/CM Interventions Discharge Planning, Psychosocial Support, Patient/Family Education   Barriers to Discharge MD  Medical stability  Nursing      PT Decreased caregiver support, Home environment access/layout, Lack of/limited family support unsure of amount of assist available upon D/C  OT      SLP Nutrition means pt is able to d/c to PLOF if receiving bolus tube feeds, goal for PO diet  SW       Team Discharge Planning: Destination: PT- (group home) ,OT- Home , SLP-Long Term Care (LTC) Projected Follow-up: PT-Home health PT, OT-  Home health OT, SLP-24 hour  supervision/assistance Projected Equipment Needs: PT-To be determined, OT- Tub/shower seat, To be determined, SLP-To be determined Equipment Details: PT-has RW, OT-  Patient/family involved in discharge planning: PT- Patient,  OT-Patient unable/family or caregiver not available, SLP-Patient  MD ELOS: 14-18 days  Medical Rehab Prognosis:  Good Assessment: The patient has been admitted for CIR therapies with the diagnosis of debility, severe dysphagia/HH. The team will be addressing functional mobility, strength, stamina, balance, safety, adaptive techniques and equipment, self-care, bowel and bladder mgt, patient and caregiver education, NMR, swallowing, transition to bolus feeds. Goals have been set at supervision for self-care and mobility, supervision to min assist for TF.   Due to the current state of emergency, patients may not be receiving their 3 hours per day of Medicare-mandated therapy.    Ranelle Oyster, MD, FAAPMR      See Team Conference Notes for weekly updates to the plan of care

## 2020-11-16 NOTE — Progress Notes (Signed)
Patient ID: [name], male   DOB: 05/22/1956, 64 y.o.   MRN: [id number]  SW met with pt in room to introduce self, and explain role. Pt confirms that he lives in a group home setting. Pt aware SW to follow-up with his sister [name].   SW completed assessment with pt sister [name] [phone]). She intends to work on finding SW a contact for [place] (group Home) to discuss discharge care planning needs. Requests to be a part of each phone call made to [place] for continuity of care purposes.   Loralee Pacas, MSW, [place] Office: [phone] Cell: [phone] Fax: [phone]

## 2020-11-17 DIAGNOSIS — E871 Hypo-osmolality and hyponatremia: Secondary | ICD-10-CM

## 2020-11-17 DIAGNOSIS — Z931 Gastrostomy status: Secondary | ICD-10-CM

## 2020-11-17 DIAGNOSIS — R569 Unspecified convulsions: Secondary | ICD-10-CM

## 2020-11-17 DIAGNOSIS — E1165 Type 2 diabetes mellitus with hyperglycemia: Secondary | ICD-10-CM

## 2020-11-17 DIAGNOSIS — R1310 Dysphagia, unspecified: Secondary | ICD-10-CM

## 2020-11-17 DIAGNOSIS — R5381 Other malaise: Secondary | ICD-10-CM | POA: Diagnosis not present

## 2020-11-17 DIAGNOSIS — D509 Iron deficiency anemia, unspecified: Secondary | ICD-10-CM

## 2020-11-17 LAB — GLUCOSE, CAPILLARY
Glucose-Capillary: 115 mg/dL — ABNORMAL HIGH (ref 70–99)
Glucose-Capillary: 123 mg/dL — ABNORMAL HIGH (ref 70–99)
Glucose-Capillary: 164 mg/dL — ABNORMAL HIGH (ref 70–99)
Glucose-Capillary: 181 mg/dL — ABNORMAL HIGH (ref 70–99)
Glucose-Capillary: 224 mg/dL — ABNORMAL HIGH (ref 70–99)

## 2020-11-17 NOTE — Progress Notes (Signed)
[place] PHYSICAL MEDICINE & REHABILITATION PROGRESS NOTE   Subjective/Complaints: Patient seen laying in bed this morning.  He states he slept well overnight.  ROS: Denies CP, SOB, N/V/D  Objective:   No results found. Recent Labs    11/16/20 0419  WBC 6.0  HGB 11.2*  HCT 35.5*  PLT 313   Recent Labs    11/16/20 0419  NA 132*  K 4.5  CL 98  CO2 23  GLUCOSE 188*  BUN 15  CREATININE 1.01  CALCIUM 9.2    Intake/Output Summary (Last 24 hours) at 11/17/2020 1051 Last data filed at 11/16/2020 2105 Gross per 24 hour  Intake --  Output 571 ml  Net -571 ml        Physical Exam: Vital Signs Blood pressure 119/81, pulse 88, temperature 98 F (36.7 C), resp. rate 17, weight 69.6 kg, SpO2 100 %. Constitutional: No distress . Vital signs reviewed. HENT: Normocephalic.  Atraumatic. Eyes: EOMI. No discharge. Cardiovascular: No JVD.  RRR. Respiratory: Normal effort.  No stridor.  Bilateral clear to auscultation. GI: Non-distended.  BS +.  + PEG. Skin: Warm and dry.  PEG site CDI. Psych: Flat.  Disengaged. Musc: No edema in extremities.  No tenderness in extremities. Neuro: Alert Dysarthria Motor: RUE/RLE: 4+/5 proximal distal LUE/LLE: 4/5 proximal to distal  Assessment/Plan: 1. Functional deficits which require 3+ hours per day of interdisciplinary therapy in a comprehensive inpatient rehab setting.  Physiatrist is providing close team supervision and 24 hour management of active medical problems listed below.  Physiatrist and rehab team continue to assess barriers to discharge/monitor patient progress toward functional and medical goals  Care Tool:  Bathing    Body parts bathed by patient: Left arm, Chest, Abdomen, Front perineal area, Right upper leg, Left upper leg, Face   Body parts bathed by helper: Buttocks, Right arm, Right lower leg, Left lower leg     Bathing assist Assist Level: Moderate Assistance - Patient 50 - 74%     Upper Body  Dressing/Undressing Upper body dressing   What is the patient wearing?: Pull over shirt    Upper body assist Assist Level: Moderate Assistance - Patient 50 - 74%    Lower Body Dressing/Undressing Lower body dressing      What is the patient wearing?: Underwear/pull up, Pants     Lower body assist Assist for lower body dressing: Maximal Assistance - Patient 25 - 49%     Toileting Toileting    Toileting assist Assist for toileting: Maximal Assistance - Patient 25 - 49%     Transfers Chair/bed transfer  Transfers assist     Chair/bed transfer assist level: Moderate Assistance - Patient 50 - 74%     Locomotion Ambulation   Ambulation assist      Assist level: Moderate Assistance - Patient 50 - 74% Assistive device: Walker-rolling Max distance: 65ft   Walk 10 feet activity   Assist     Assist level: Moderate Assistance - Patient - 50 - 74% Assistive device: Walker-rolling   Walk 50 feet activity   Assist Walk 50 feet with 2 turns activity did not occur: Safety/medical concerns (fatigue, weakness, decreased balance/postural control)         Walk 150 feet activity   Assist Walk 150 feet activity did not occur: Safety/medical concerns (fatigue, weakness, decreased balance/postural control)         Walk 10 feet on uneven surface  activity   Assist Walk 10 feet on uneven surfaces activity did  not occur: Safety/medical concerns (fatigue, weakness, decreased balance/postural control, poor safety awareness)         Wheelchair     Assist Will patient use wheelchair at discharge?: Yes Type of Wheelchair: Manual Wheelchair activity did not occur: Refused  Wheelchair assist level: Dependent - Patient 0%      Wheelchair 50 feet with 2 turns activity    Assist        Assist Level: Dependent - Patient 0%   Wheelchair 150 feet activity     Assist      Assist Level: Dependent - Patient 0%   Blood pressure 119/81, pulse 88,  temperature 98 F (36.7 C), resp. rate 17, weight 69.6 kg, SpO2 100 %.  Medical Problem List and Plan: 1.  Decreased functional mobility secondary to acute hypoxic hypercapnic respiratory failure with aspiration/ventilatory support pneumonia  Continue CIR 2.  Antithrombotics: -DVT/anticoagulation: Subcutaneous heparin             -antiplatelet therapy: N/A 3. Pain Management: Oxycodone as needed 4. Mood: no SSRIs             -antipsychotic agents: Seroquel 400 mg twice daily 5. Neuropsych: This patient is not capable of making decisions on his own behalf. 6. Skin/Wound Care: Routine skin checks 7. Fluids/Electrolytes/Nutrition: Routine I/Os 8.  Paraesophageal hernia/massively dilated esophagus.  Status post hernia repair percutaneous gastrostomy tube placement 10/26/2020 per cardiothoracic surgery Dr. Cliffton Asters changed to placement of jejunal arm via indwelling gastrostomy tube 11/07/2020 per interventional radiology.    -Continue Protonix twice daily, MiraLAX twice daily, Colace twice daily.   9.  Iron deficiency anemia.    Hemoglobin 11.2 on 11/24  Continue to monitor 10.  Schizophrenia/intellectual delay/seizure disorder.  Valproic acid 250 mg twice daily, Lamictal 100 mg daily, seroquel 400mg  bid  No breakthrough seizures since admission to CIR 11.  Hypothyroidism.  Synthroid 12.  Type 2 diabetes mellitus with hyperglycemia.  Hemoglobin A1c 5.8.  SSI.  Continue SSI  Mildly elevated on 11/25 13.  Hyperlipidemia.  Crestor 14.  History of CVA with left-sided residual weakness continue therapies as directed 15.  Dysphagia  N.p.o. at present  Continue tube feeds via PEG  See #8  ?esophogram prior to MBS per SLP.  16.  Hyponatremia  Sodium 132 on 11/24, labs ordered for tomorrow  LOS: 2 days A FACE TO FACE EVALUATION WAS PERFORMED  Cherylee Rawlinson 12/24 11/17/2020, 10:51 AM

## 2020-11-18 ENCOUNTER — Inpatient Hospital Stay (HOSPITAL_COMMUNITY): Payer: Medicare Other

## 2020-11-18 ENCOUNTER — Inpatient Hospital Stay (HOSPITAL_COMMUNITY): Payer: Medicare Other | Admitting: Physical Therapy

## 2020-11-18 ENCOUNTER — Inpatient Hospital Stay (HOSPITAL_COMMUNITY): Payer: Medicare Other | Admitting: Speech Pathology

## 2020-11-18 DIAGNOSIS — K224 Dyskinesia of esophagus: Secondary | ICD-10-CM

## 2020-11-18 DIAGNOSIS — Z8719 Personal history of other diseases of the digestive system: Secondary | ICD-10-CM | POA: Insufficient documentation

## 2020-11-18 DIAGNOSIS — R1310 Dysphagia, unspecified: Secondary | ICD-10-CM | POA: Diagnosis not present

## 2020-11-18 DIAGNOSIS — K229 Disease of esophagus, unspecified: Secondary | ICD-10-CM | POA: Insufficient documentation

## 2020-11-18 DIAGNOSIS — K2289 Other specified disease of esophagus: Secondary | ICD-10-CM

## 2020-11-18 DIAGNOSIS — R5381 Other malaise: Secondary | ICD-10-CM | POA: Diagnosis not present

## 2020-11-18 DIAGNOSIS — E871 Hypo-osmolality and hyponatremia: Secondary | ICD-10-CM | POA: Diagnosis not present

## 2020-11-18 HISTORY — DX: Dyskinesia of esophagus: K22.4

## 2020-11-18 LAB — GLUCOSE, CAPILLARY
Glucose-Capillary: 129 mg/dL — ABNORMAL HIGH (ref 70–99)
Glucose-Capillary: 142 mg/dL — ABNORMAL HIGH (ref 70–99)
Glucose-Capillary: 147 mg/dL — ABNORMAL HIGH (ref 70–99)
Glucose-Capillary: 148 mg/dL — ABNORMAL HIGH (ref 70–99)
Glucose-Capillary: 159 mg/dL — ABNORMAL HIGH (ref 70–99)
Glucose-Capillary: 196 mg/dL — ABNORMAL HIGH (ref 70–99)
Glucose-Capillary: 197 mg/dL — ABNORMAL HIGH (ref 70–99)

## 2020-11-18 LAB — BASIC METABOLIC PANEL
Anion gap: 11 (ref 5–15)
BUN: 16 mg/dL (ref 8–23)
CO2: 26 mmol/L (ref 22–32)
Calcium: 9.3 mg/dL (ref 8.9–10.3)
Chloride: 94 mmol/L — ABNORMAL LOW (ref 98–111)
Creatinine, Ser: 0.93 mg/dL (ref 0.61–1.24)
GFR, Estimated: >60 mL/min (ref >60)
Glucose, Bld: 179 mg/dL — ABNORMAL HIGH (ref 70–99)
Potassium: 4.7 mmol/L (ref 3.5–5.1)
Sodium: 131 mmol/L — ABNORMAL LOW (ref 135–145)

## 2020-11-18 MED ORDER — IOHEXOL 300 MG/ML  SOLN
25.0000 mL | Freq: Once | INTRAMUSCULAR | Status: AC | PRN
  Administered 2020-11-18: 25 mL via ORAL

## 2020-11-18 NOTE — Progress Notes (Addendum)
Patient ID: [name], male   DOB: 03/02/1956, 64 y.o.   MRN: [id number]  SW left message for pt sister [name] [facility]) to provide updates on ELOS 12-18 days. Sw informed there will be follow-up after team conference. *SW spoke with pt sister to inform on above. She also states contact pt to discuss pt nursing care needs will be [name] [phone]). SW informed will provide updates once there is more information. SW left message requesting return phone call for [name] to discuss pt nursing care needs, informed on ELOS, and will follow-up with d/c date after team conference.   Sw received additional updates from pt sister [name] who reported that she spoke with the administrator [name] with [facility] (p:[phone] [name]; [phone] [name]) who states that pt will be d/c from [facility] on 11/28 since he has been in the hospital for 30 days; however, they will keep his bed for additional 30 days (until about 12/18). States she would like to submit an MCO next week to support the reason in holding his bed. SW left message [name] to discuss above, follow-up after team conference, and waiting on follow-up.   [name], MSW, LCSWA Office: [phone] Cell: [phone] Fax: [phone]

## 2020-11-18 NOTE — Care Management (Signed)
Inpatient Rehabilitation Center Individual Statement of Services  Patient Name:  [name]  Date:  11/18/2020  Welcome to the Inpatient Rehabilitation Center.  Our goal is to provide you with an individualized program based on your diagnosis and situation, designed to meet your specific needs.  With this comprehensive rehabilitation program, you will be expected to participate in at least 3 hours of rehabilitation therapies Monday-Friday, with modified therapy programming on the weekends.  Your rehabilitation program will include the following services:  Physical Therapy (PT), Occupational Therapy (OT), Speech Therapy (ST), 24 hour per day rehabilitation nursing, Therapeutic Recreaction (TR), Psychology, Neuropsychology, Care Coordinator, Rehabilitation Medicine, Nutrition Services, Pharmacy Services and Other  Weekly team conferences will be held on Tuesdays to discuss your progress.  Your Inpatient Rehabilitation Care Coordinator will talk with you frequently to get your input and to update you on team discussions.  Team conferences with you and your family in attendance may also be held.  Expected length of stay: 12-18 days    Overall anticipated outcome: Supervision  Depending on your progress and recovery, your program may change. Your Inpatient Rehabilitation Care Coordinator will coordinate services and will keep you informed of any changes. Your Inpatient Rehabilitation Care Coordinator's name and contact numbers are listed  below.  The following services may also be recommended but are not provided by the Inpatient Rehabilitation Center:   Driving Evaluations  Home Health Rehabiltiation Services  Outpatient Rehabilitation Services  Vocational Rehabilitation   Arrangements will be made to provide these services after discharge if needed.  Arrangements include referral to agencies that provide these services.  Your insurance has been verified to be:  Medicare  A/B  Your primary doctor is:  [facility]  Pertinent information will be shared with your doctor and your insurance company.  Inpatient Rehabilitation Care Coordinator:  Susie Cassette [phone] or ([phone]  Information discussed with and copy given to patient by: Gretchen Short, 11/18/2020, 12:18 PM

## 2020-11-18 NOTE — Progress Notes (Addendum)
[facility] PHYSICAL MEDICINE & REHABILITATION PROGRESS NOTE   Subjective/Complaints: Patient seen sitting up in bed this morning working with therapies.  He states he slept well overnight.  Discussed swallowing and further work-up with therapies.  ROS: Denies CP, SOB, N/V/D  Objective:   No results found. Recent Labs    11/16/20 0419  WBC 6.0  HGB 11.2*  HCT 35.5*  PLT 313   Recent Labs    11/16/20 0419 11/18/20 0449  NA 132* 131*  K 4.5 4.7  CL 98 94*  CO2 23 26  GLUCOSE 188* 179*  BUN 15 16  CREATININE 1.01 0.93  CALCIUM 9.2 9.3    Intake/Output Summary (Last 24 hours) at 11/18/2020 1211 Last data filed at 11/18/2020 1143 Gross per 24 hour  Intake 800 ml  Output 1300 ml  Net -500 ml        Physical Exam: Vital Signs Blood pressure 103/62, pulse 95, temperature 98.3 F (36.8 C), resp. rate 14, weight 67.8 kg, SpO2 96 %. Constitutional: No distress . Vital signs reviewed. HENT: Normocephalic.  Atraumatic. Eyes: EOMI. No discharge. Cardiovascular: No JVD.  RRR. Respiratory: Normal effort.  No stridor.  Bilateral clear to auscultation. GI: Non-distended.  BS +.  + PEG. Skin: Warm and dry.  PEG site CDI. Psych: Flat.  Is engaged. Musc: No edema in extremities.  No tenderness in extremities. Neuro: Alert and oriented x2 Dysarthria, unchanged Motor: RUE/RLE: 4+/5 proximal distal LUE: 4/5 proximal to distal, stable LLE: 4/5 hip flexion, knee extension, 1/5 ankle dorsiflexion  Assessment/Plan: 1. Functional deficits which require 3+ hours per day of interdisciplinary therapy in a comprehensive inpatient rehab setting.  Physiatrist is providing close team supervision and 24 hour management of active medical problems listed below.  Physiatrist and rehab team continue to assess barriers to discharge/monitor patient progress toward functional and medical goals  Care Tool:  Bathing    Body parts bathed by patient: Left arm, Chest, Abdomen, Front perineal  area, Right upper leg, Left upper leg, Face   Body parts bathed by helper: Right arm, Left arm, Buttocks, Left upper leg, Right lower leg, Right upper leg, Left lower leg     Bathing assist Assist Level: Moderate Assistance - Patient 50 - 74%     Upper Body Dressing/Undressing Upper body dressing   What is the patient wearing?: Pull over shirt    Upper body assist Assist Level: Moderate Assistance - Patient 50 - 74%    Lower Body Dressing/Undressing Lower body dressing      What is the patient wearing?: Underwear/pull up     Lower body assist Assist for lower body dressing: Maximal Assistance - Patient 25 - 49%     Toileting Toileting    Toileting assist Assist for toileting: Maximal Assistance - Patient 25 - 49%     Transfers Chair/bed transfer  Transfers assist     Chair/bed transfer assist level: Moderate Assistance - Patient 50 - 74%     Locomotion Ambulation   Ambulation assist      Assist level: Moderate Assistance - Patient 50 - 74% Assistive device: Walker-rolling Max distance: 4ft   Walk 10 feet activity   Assist     Assist level: Moderate Assistance - Patient - 50 - 74% Assistive device: Walker-rolling   Walk 50 feet activity   Assist Walk 50 feet with 2 turns activity did not occur: Safety/medical concerns (fatigue, weakness, decreased balance/postural control)         Walk 150 feet  activity   Assist Walk 150 feet activity did not occur: Safety/medical concerns (fatigue, weakness, decreased balance/postural control)         Walk 10 feet on uneven surface  activity   Assist Walk 10 feet on uneven surfaces activity did not occur: Safety/medical concerns (fatigue, weakness, decreased balance/postural control, poor safety awareness)         Wheelchair     Assist Will patient use wheelchair at discharge?: Yes Type of Wheelchair: Manual Wheelchair activity did not occur: Refused  Wheelchair assist level: Dependent  - Patient 0%      Wheelchair 50 feet with 2 turns activity    Assist        Assist Level: Dependent - Patient 0%   Wheelchair 150 feet activity     Assist      Assist Level: Dependent - Patient 0%   Blood pressure 103/62, pulse 95, temperature 98.3 F (36.8 C), resp. rate 14, weight 67.8 kg, SpO2 96 %.  Medical Problem List and Plan: 1.  Decreased functional mobility secondary to acute hypoxic hypercapnic respiratory failure with aspiration/ventilatory support pneumonia  Continue CIR 2.  Antithrombotics: -DVT/anticoagulation: Subcutaneous heparin             -antiplatelet therapy: N/A 3. Pain Management: Oxycodone as needed 4. Mood: no SSRIs             -antipsychotic agents: Seroquel 400 mg twice daily 5. Neuropsych: This patient is not capable of making decisions on his own behalf. 6. Skin/Wound Care: Routine skin checks 7. Fluids/Electrolytes/Nutrition: Routine I/Os 8.  Paraesophageal hernia/massively dilated esophagus.  Status post hernia repair percutaneous gastrostomy tube placement 10/26/2020 per cardiothoracic surgery Dr. Cliffton Asters changed to placement of jejunal arm via indwelling gastrostomy tube 11/07/2020 per interventional radiology.    -Continue Protonix twice daily, MiraLAX twice daily, Colace twice daily.   9.  Iron deficiency anemia.    Hemoglobin 11.2 on 11/24  Continue to monitor 10.  Schizophrenia/intellectual delay/seizure disorder.  Valproic acid 250 mg twice daily, Lamictal 100 mg daily, seroquel 400mg  bid  No breakthrough seizures since admission to CIR-11/26 11.  Hypothyroidism.  Synthroid 12.  Type 2 diabetes mellitus with hyperglycemia, compounded by tube feeds.  Hemoglobin A1c 5.8.  SSI.  Continue SSI  Mildly elevated on 11/26 13.  Hyperlipidemia.  Crestor 14.  History of CVA with left-sided residual weakness continue therapies as directed 15.  Dysphagia  N.p.o. at present  Continue tube feeds via PEG  See #8  CT angio chest  personally reviewed-esophagus looks relatively stable, however will order esophagram for further evaluation, plan for MBS on Monday-discussed with therapies as well as with radiology to ensure water-soluble medium. 16.  Hyponatremia  Sodium 131 on 11/26  >35 minutes spent in total in after mentioned as well as reviewing imaging, labs, and coronation of care in regard to swallowing and further work-up/interventions  LOS: 3 days A FACE TO FACE EVALUATION WAS PERFORMED  Branda Chaudhary 12/26 11/18/2020, 12:11 PM

## 2020-11-18 NOTE — Progress Notes (Signed)
Physical Therapy Session Note  Patient Details  Name: [name] MRN: [id number] Date of Birth: October 05, 1956  Today's Date: 11/18/2020 PT Individual Time: 1431-1516 PT Individual Time Calculation (min): 45 min   Short Term Goals: Week 1:  PT Short Term Goal 1 (Week 1): pt will transfer bed<>chair with LRAD and CGA PT Short Term Goal 2 (Week 1): Pt will ambulate 70ft with LRAD and CGA PT Short Term Goal 3 (Week 1): Pt will perform simulated car transfer with LRAD and min A  Skilled Therapeutic Interventions/Progress Updates:    Pt received supine in bed and agreeable to therapy session. Reports need to urinate - due to urgency provided urinal in bed, max assist LB clothing management as pt pulling his pants up as opposed to down and then only set-up assist for urinal management. Pt reports sudden urgency for BM and initiates OOB mobility requiring cuing for safety. Supine>sitting L EOB, HOB partially elevated and using bedrail, with CGA for trunk steadying. Sit>stand EOB>RW with heavy min assist for balance due to increased postural sway. Gait training ~28ft to bathroom using RW with min/mod assist for balance and +2 present for safety - demos poor safety awareness and increased speed of movement with AD requiring cuing for safety. Sit<>stands BSC<>RW with min assist for balance. Pt reports being finished with loose stool but after performing peri-care and assisting with pulling up LB clothing pt reports another episode of urgency - continent of more loose stool and RN made aware. Standing peri-care with L UE support on RW and CGA/min assist for balance with set-up assist for hygiene. Gait ~53ft to sink using RW with heavy min assist for balance and cuing for safe AD management. Standing hand hygiene at sink with CGA for steadying.  Transported to/from gym in w/c for time management and energy conservation. Gait training ~139ft x2 using RW with min assist primarily but up to mod assist in  turns due to pt catching L foot on RW - demos L hip hiking with L LE externally rotated due to significant lack of L hip/knee flexion muscle activation to perform proper swing phase foot clearance and limb advancement. Pt again reports need to use bathroom. Transported back to room in w/c. Gait ~18ft x2 in/out bathroom using RW with min assist for balance and continued cuing for safe AD management. Performed toileting and hand hygiene at sink as described above - pt unable to void further. Pt requesting to return to bed and when initiating sitting on EOB pt also spontaneously starts lying down at the same time requiring min assist for safety. Pt left supine in bed with needs in reach and RN present to assume care of patient.  Therapy Documentation Precautions:  Precautions Precautions: Fall Precaution Comments: L hemiparesis, PEG Restrictions Weight Bearing Restrictions: No  Pain:   Denies pain during session.  Therapy/Group: Individual Therapy  Ginny Forth , PT, DPT, CSRS  11/18/2020, 2:47 PM

## 2020-11-18 NOTE — Progress Notes (Signed)
Occupational Therapy Session Note  Patient Details  Name: [name] MRN: [id number] Date of Birth: 1956/09/30  Today's Date: 11/18/2020 OT Individual Time: 0258-5277 OT Individual Time Calculation (min): 44 min    Short Term Goals: Week 1:  OT Short Term Goal 1 (Week 1): Pt will wait to initiate transfer till after w/c brakes are locked to demo improved safety awareness wiht no VC OT Short Term Goal 2 (Week 1): Pt will transfer to toielt wiht CGA OT Short Term Goal 3 (Week 1): Pt will don LB clothing wiht A only for balance OT Short Term Goal 4 (Week 1): Pt will complete 2/3 steps of toileting OT Short Term Goal 5 (Week 1): Pt will bathe with MIN A and no more than min VC for sequencing/thoroughness  Skilled Therapeutic Interventions/Progress Updates:    1:1. Pt received in bed agreeable to getting up and dressed. Pt continues to require increased encouragement to participate fully in ADLs. Pt requires MAX A for donning pants at EOB, and MOD A for donning shirt seated seated in w/c. Pt completes toileting 2x with MOD A first round and MAX A second time (NT present to finish at end of session. Pt completes 3 rounds of horse shoes to improve balance, weight shifting and attention required for ADLs with MOD A when reaching across midline with R and MIN A to reach laterally to R. Exited session with pt seated in [facility] with NT in room finishing toileting  Therapy Documentation Precautions:  Precautions Precautions: Fall Precaution Comments: L hemiparesis, PEG Restrictions Weight Bearing Restrictions: No General:   Vital Signs: Therapy Vitals Temp: 98.3 F (36.8 C) Pulse Rate: 95 Resp: 14 BP: 103/62 Patient Position (if appropriate): Lying Oxygen Therapy SpO2: 96 % O2 Device: Room Air Pain:   ADL: ADL Grooming: Minimal assistance Where Assessed-Grooming: Sitting at sink Upper Body Bathing: Minimal assistance Where Assessed-Upper Body Bathing: Sitting at sink Lower  Body Bathing: Moderate assistance Where Assessed-Lower Body Bathing: Sitting at sink, Standing at sink Upper Body Dressing: Moderate assistance Where Assessed-Upper Body Dressing: Sitting at sink Lower Body Dressing: Maximal assistance Where Assessed-Lower Body Dressing: Standing at sink, Sitting at sink Toileting: Maximal assistance Where Assessed-Toileting: Teacher, adult education: Curator Method: Surveyor, minerals: Scientist, research (medical)    Praxis   Exercises:   Other Treatments:     Therapy/Group: Individual Therapy  [name] 11/18/2020, 6:53 AM

## 2020-11-18 NOTE — Progress Notes (Signed)
Physical Therapy Session Note  Patient Details  Name: [name] MRN: [id number] Date of Birth: 1956-07-03  Today's Date: 11/18/2020 PT Individual Time: 1110-1205 PT Individual Time Calculation (min): 55 min   Short Term Goals: Week 1:  PT Short Term Goal 1 (Week 1): pt will transfer bed<>chair with LRAD and CGA PT Short Term Goal 2 (Week 1): Pt will ambulate 87ft with LRAD and CGA PT Short Term Goal 3 (Week 1): Pt will perform simulated car transfer with LRAD and min A  Skilled Therapeutic Interventions/Progress Updates:     Patient in bed upon PT arrival. Patient alert and agreeable to PT session. Patient denied pain during session. Asking to use the urinal at beginning of session. RN disconnected tube feed for therapies at beginning of session.   Therapeutic Activity: Bed Mobility: Utilized urinal with total a prior to mobility, see flowsheet. Patient then requesting to go to the bathroom to have a BM. Patient performed supine to/from sit with supervision with min use of bed rail.  Transfers: Patient performed sit to/from stand from the bed x1, toilet x1 with grab bar, and w/c x4 with CGA-min A using RW with L hand splint. Provided verbal cues for L hand placement in splint, backing up to touch B legs to seat before sitting, reaching back to control descent, and waiting for therapist to assist with standing due to mild impulsivity with transfers. Patient was continent of bowl during toileting, required total A for peri-care and max A for lower body clothing management. Provided cues for diaphragmatic breathing and relaxation to reduced Val salva maneuver and provided close supervision during toileting due to patient's impulsivity. Patient stood at the sink to perform hand hygiene with min A after toileting.   Gait Training:  Patient ambulated 10-15 feet to/from the bathroom and 50 feet x3 using RW with L hand splint and 10 feet using R HHA with min A-CGA. Ambulated with  step-through gait pattern with decreased step length and stance time on L, decreased knee/hip extension on L in stance, and poor awareness and grip with L hand, required PT to provide approximation and facilitation of hand placement throughout, even with hand splint. Provided verbal cues for erect posture and looking ahead, L knee/hip extension/elongation, facilitated L weight shift, and provided feedback on maintaining L grip on RW.  Wheelchair Mobility:  Patient was transported in the w/c with total A throughout session for energy conservation and time management.  Patient in bed at end of session with breaks locked, bed alarm set, and all needs within reach. RN made aware that patient was in bed with HOB at 30 deg to resume tube feed.   Therapy Documentation Precautions:  Precautions Precautions: Fall Precaution Comments: L hemiparesis, PEG Restrictions Weight Bearing Restrictions: No   Therapy/Group: Individual Therapy  Edvardo Honse L Montray Kliebert PT, DPT  11/18/2020, 4:46 PM

## 2020-11-18 NOTE — Progress Notes (Signed)
Speech Language Pathology Daily Session Note  Patient Details  Name: ARIYON MITTLEMAN MRN: [id number] Date of Birth: [birth year]  Today's Date: 11/18/2020 SLP Individual Time: 0725-0820 SLP Individual Time Calculation (min): 55 min  Short Term Goals: Week 1: SLP Short Term Goal 1 (Week 1): Pending instrumental assessment, patient will tolerate trials of safest and least restrictive diet with no overt s/s aspiration and min A cues for use of compensatory strategies SLP Short Term Goal 2 (Week 1): Pt will tolerate PO trials with SLP only with min A and no overt s/s aspiration  Skilled Therapeutic Interventions: Skilled treatment session focused on dysphagia goals. SLP facilitated session by providing Min verbal cues for problem solving during oral care via the suction toothbrush. SLP provided patient with trials of ice chips, thin liquids and pureed textures. Patient demonstrated what appeared to be a mild delay in AP transit due to tongue pumping but no overt s/s of aspiration were noted. Recommend patient remain NPO with an objective swallow assessment needed prior to diet initiation. Spoke with physician regarding recommendations for an esophogram prior to MBS. Patient left upright in bed with alarm on and all needs within reach. Continue with current plan of care.      Pain No/Denies Pain   Therapy/Group: Individual Therapy  Athena Baltz 11/18/2020, 9:35 AM

## 2020-11-19 ENCOUNTER — Inpatient Hospital Stay (HOSPITAL_COMMUNITY): Payer: Medicare Other

## 2020-11-19 ENCOUNTER — Inpatient Hospital Stay (HOSPITAL_COMMUNITY): Payer: Medicare Other | Admitting: Speech Pathology

## 2020-11-19 DIAGNOSIS — E871 Hypo-osmolality and hyponatremia: Secondary | ICD-10-CM | POA: Diagnosis not present

## 2020-11-19 DIAGNOSIS — Z931 Gastrostomy status: Secondary | ICD-10-CM | POA: Diagnosis not present

## 2020-11-19 DIAGNOSIS — R5381 Other malaise: Secondary | ICD-10-CM | POA: Diagnosis not present

## 2020-11-19 DIAGNOSIS — R1310 Dysphagia, unspecified: Secondary | ICD-10-CM | POA: Diagnosis not present

## 2020-11-19 LAB — GLUCOSE, CAPILLARY
Glucose-Capillary: 105 mg/dL — ABNORMAL HIGH (ref 70–99)
Glucose-Capillary: 107 mg/dL — ABNORMAL HIGH (ref 70–99)
Glucose-Capillary: 129 mg/dL — ABNORMAL HIGH (ref 70–99)
Glucose-Capillary: 131 mg/dL — ABNORMAL HIGH (ref 70–99)
Glucose-Capillary: 135 mg/dL — ABNORMAL HIGH (ref 70–99)
Glucose-Capillary: 182 mg/dL — ABNORMAL HIGH (ref 70–99)

## 2020-11-19 NOTE — Progress Notes (Signed)
Speech Language Pathology Daily Session Note  Patient Details  Name: [name] MRN: [id number] Date of Birth: [birth year]  Today's Date: 11/19/2020 SLP Individual Time: 0905-0930 SLP Individual Time Calculation (min): 25 min and Today's Date: 11/19/2020 SLP Missed Time: 30 Minutes Missed Time Reason: Patient fatigue  Short Term Goals: Week 1: SLP Short Term Goal 1 (Week 1): Pending instrumental assessment, patient will tolerate trials of safest and least restrictive diet with no overt s/s aspiration and min A cues for use of compensatory strategies SLP Short Term Goal 2 (Week 1): Pt will tolerate PO trials with SLP only with min A and no overt s/s aspiration  Skilled Therapeutic Interventions: Skilled treatment session focused on dysphagia goals. SLP facilitated session by providing Max verbal cues for arousal and attention to PO trials. Patient required Mod verbal and tactile cues for problem solving during oral care via the suction toothbrush. Patient consumed trials of ice chips with mildly delayed AP transit due to tongue pumping. Patient also consumed small sips of thin liquids with delayed coughing noted, suspect due to lethargy and decreased attention to bolus. Throughout trials, patient kept his eyes closed with minimal social interaction/engagement with clinician. When asked questions, he utilized head nods for communication with minimal verbal output. Patient missed remaining 30 minutes of session due to fatigue which was impacting safety with PO intake. Patient left supine in bed with alarm on and all needs within reach. Continue with current plan of care.      Pain No/Denies Pain   Therapy/Group: Individual Therapy  [name] 11/19/2020, 9:43 AM

## 2020-11-19 NOTE — Progress Notes (Signed)
Physical Therapy Session Note  Patient Details  Name: [name] MRN: [id number] Date of Birth: Mar 03, 1956  Today's Date: 11/19/2020 PT Individual Time: 1110-1215 PT Individual Time Calculation (min): 65 min   Short Term Goals: Week 1:  PT Short Term Goal 1 (Week 1): pt will transfer bed<>chair with LRAD and CGA PT Short Term Goal 2 (Week 1): Pt will ambulate 49ft with LRAD and CGA PT Short Term Goal 3 (Week 1): Pt will perform simulated car transfer with LRAD and min A  Skilled Therapeutic Interventions/Progress Updates:     Patient in bed asleep upon PT arrival. Patient easily aroused and agreeable to PT session. Patient denied pain during session.  Therapeutic Activity: Bed Mobility: Patient performed supine to/from sit with supervision with increased time and use of bed rail.  Transfers: Patient performed sit to/from stand x8 with CGA progressing to close supervision without an AD, and toilet transfer x2 with CGA using grab bar. Provided verbal cues for forward weight shift, controlled descent for safety, and self monitoring to reduce impulsivity. Patient was continent of bladder and bowl x2 during session. Performed peri-care with total A and lower body clothing management with mod A.   Gait Training:  Patient ambulated 80 feet x2 and 50 feet x1 without an AD with min A. Ambulated with increased L foot drag with circumduction gait, improved directly following L step-up, but returned with poor carry over after performing other tasks. Provided verbal cues for erect posture, increased L knee/hip flexion in swing, increased L foot clearance, and reciprocal stepping. Patient ascended/descended 4 steps x2 using B rails with min A. Performed step-to gait pattern leading with R while ascending and L while descending. Required max cues for technique and sequencing throughout.   Wheelchair Mobility:  Patient was transported in the w/c with total A throughout session for energy  conservation and time management.  Neuromuscular Re-ed: Patient performed the following L lower extremity motor control activities: -L step-taps on 6" step without upper extremity support 3x10, mod progressing to min A for balance and improved L foot clearance with repetitive trials -R step taps on 6" step without upper extremity support, patient alternating feed and sitting after 1 trial despite cues for R foot only and 10 reps, noted poor initiation to shift onto his L leg and decreased attention to task with busy gym environment -sit to stand without upper extremity support x10 with blocked practice for improved motor control  Patient's sister came at end of session. Observed last ambulation trial. His sister reports that he has made good progress with mobility since she last saw him. Stated that he was ambulating independently without AD PTA. Stated that L foot drag and decreased L upper extremity strength have intensified since COVID due to decreased physical activity. Reports patient was independent with bathing, dressing, feeding, and mobility, but has 24/7 supervision and can have increased assist, if needed at d/c. Reports concerns about receiving follow-up therapies. Informed her that OT, PT, SLP all recommending follow-up therapies at d/c and will be discussed and arranged closing to d/c. His sister also reports that he has poor STM and thrives with a routine. Will discuss with therapy team about changes to schedule and cues to improve patient's initiation with self care tasks. Patient was attentive throughout the discussion. Patient and his sister appreciative of discussion/education.   Patient in bed at end of session with breaks locked, bed alarm set, and all needs within reach.   Therapy Documentation Precautions:  Precautions  Precautions: Fall Precaution Comments: L hemiparesis, PEG Restrictions Weight Bearing Restrictions: No   Therapy/Group: Individual Therapy  Inmer Nix L  Tiny Rietz PT, DPT  11/19/2020, 3:45 PM

## 2020-11-19 NOTE — Progress Notes (Addendum)
[facility] [facility] PHYSICAL MEDICINE & REHABILITATION PROGRESS NOTE   Subjective/Complaints: Patient seen laying in bed this morning.  He states he slept well overnight.  No reported issues overnight.  He denies complaints.  ROS: Denies CP, SOB, N/V/D  Objective:   DG ESOPHAGUS W SINGLE CM (SOL OR THIN BA)  Result Date: 11/18/2020 CLINICAL DATA:  History of hiatal hernia repair.  Evaluate for leak. EXAM: ESOPHAGUS/BARIUM SWALLOW/TABLET STUDY TECHNIQUE: Initial scout AP supine abdominal image obtained to insure adequate colon cleansing. Barium was introduced into the colon in a retrograde fashion and refluxed from the rectum to the cecum. Spot images of the colon followed by overhead radiographs were obtained. FLUOROSCOPY TIME:  Fluoroscopy Time:  1 minutes 18 seconds Radiation Exposure Index (if provided by the fluoroscopic device): 10.1 mGy Number of Acquired Spot Images: 0 COMPARISON:  None. FINDINGS: Patient is unable to stand. A modified, limited exam was performed with the patient in the semi upright LPO orientation. Initially several small sips of water-soluble contrast material was ingested by the patient. This passed through the esophagus and into the stomach without difficulty. No aspiration noted. The patient was then given thin barium which also easily passed through the esophagus and into the stomach. No extravasation of contrast material identified to suggest postoperative leak. The distal esophagus appears increased in caliber and patulous. Fundoplication wrap appears intact. IMPRESSION: 1. No signs of postoperative leak status post hiatal hernia repair. Electronically Signed   By: Signa Kell M.D.   On: 11/18/2020 14:02   No results for input(s): WBC, HGB, HCT, PLT in the last 72 hours. Recent Labs    11/18/20 0449  NA 131*  K 4.7  CL 94*  CO2 26  GLUCOSE 179*  BUN 16  CREATININE 0.93  CALCIUM 9.3    Intake/Output Summary (Last 24 hours) at 11/19/2020 1307 Last data filed at  11/19/2020 0406 Gross per 24 hour  Intake 680 ml  Output 325 ml  Net 355 ml        Physical Exam: Vital Signs Blood pressure 123/82, pulse 94, temperature 98.1 F (36.7 C), temperature source Oral, resp. rate 18, height 5\' 4"  (1.626 m), weight 64.3 kg, SpO2 93 %. Constitutional: No distress . Vital signs reviewed. HENT: Normocephalic.  Atraumatic. Eyes: EOMI. No discharge. Cardiovascular: No JVD.  RRR. Respiratory: Normal effort.  No stridor.  Bilateral clear to auscultation. GI: Non-distended.  BS +.  + PEG. Skin: Warm and dry.  PEG site CDI. Psych: Flat. Musc: No edema in extremities.  No tenderness in extremities. Neuro: Alert and oriented x2. Dysarthria, stable Motor: RUE/RLE: 4+/5 proximal distal LUE: 4/5 proximal to distal, unchanged LLE: 4/5 hip flexion, knee extension, 1/5 ankle dorsiflexion  Assessment/Plan: 1. Functional deficits which require 3+ hours per day of interdisciplinary therapy in a comprehensive inpatient rehab setting.  Physiatrist is providing close team supervision and 24 hour management of active medical problems listed below.  Physiatrist and rehab team continue to assess barriers to discharge/monitor patient progress toward functional and medical goals  Care Tool:  Bathing    Body parts bathed by patient: Left arm, Chest, Abdomen, Front perineal area, Right upper leg, Left upper leg, Face   Body parts bathed by helper: Right arm, Left arm, Buttocks, Left upper leg, Right lower leg, Right upper leg, Left lower leg     Bathing assist Assist Level: Moderate Assistance - Patient 50 - 74%     Upper Body Dressing/Undressing Upper body dressing   What is the  patient wearing?: Pull over shirt    Upper body assist Assist Level: Moderate Assistance - Patient 50 - 74%    Lower Body Dressing/Undressing Lower body dressing      What is the patient wearing?: Underwear/pull up     Lower body assist Assist for lower body dressing: Maximal  Assistance - Patient 25 - 49%     Toileting Toileting    Toileting assist Assist for toileting: Maximal Assistance - Patient 25 - 49%     Transfers Chair/bed transfer  Transfers assist     Chair/bed transfer assist level: Minimal Assistance - Patient > 75% Chair/bed transfer assistive device: Geologist, engineering   Ambulation assist      Assist level: Moderate Assistance - Patient 50 - 74% Assistive device: Walker-rolling Max distance: 184ft   Walk 10 feet activity   Assist     Assist level: Minimal Assistance - Patient > 75% Assistive device: Walker-rolling, Orthosis   Walk 50 feet activity   Assist Walk 50 feet with 2 turns activity did not occur: Safety/medical concerns (fatigue, weakness, decreased balance/postural control)  Assist level: Moderate Assistance - Patient - 50 - 74% Assistive device: Walker-rolling, Orthosis    Walk 150 feet activity   Assist Walk 150 feet activity did not occur: Safety/medical concerns (fatigue, weakness, decreased balance/postural control)         Walk 10 feet on uneven surface  activity   Assist Walk 10 feet on uneven surfaces activity did not occur: Safety/medical concerns (fatigue, weakness, decreased balance/postural control, poor safety awareness)         Wheelchair     Assist Will patient use wheelchair at discharge?: Yes Type of Wheelchair: Manual Wheelchair activity did not occur: Refused  Wheelchair assist level: Dependent - Patient 0%      Wheelchair 50 feet with 2 turns activity    Assist        Assist Level: Dependent - Patient 0%   Wheelchair 150 feet activity     Assist      Assist Level: Dependent - Patient 0%   Blood pressure 123/82, pulse 94, temperature 98.1 F (36.7 C), temperature source Oral, resp. rate 18, height 5\' 4"  (1.626 m), weight 64.3 kg, SpO2 93 %.  Medical Problem List and Plan: 1.  Decreased functional mobility secondary to acute  hypoxic hypercapnic respiratory failure with aspiration/ventilatory support pneumonia  Continue CIR 2.  Antithrombotics: -DVT/anticoagulation: Subcutaneous heparin             -antiplatelet therapy: N/A 3. Pain Management: Oxycodone as needed 4. Mood: no SSRIs             -antipsychotic agents: Seroquel 400 mg twice daily 5. Neuropsych: This patient is not capable of making decisions on his own behalf. 6. Skin/Wound Care: Routine skin checks 7. Fluids/Electrolytes/Nutrition: Routine I/Os 8.  Paraesophageal hernia/massively dilated esophagus.  Status post hernia repair percutaneous gastrostomy tube placement 10/26/2020 per cardiothoracic surgery Dr. 13/02/2020 changed to placement of jejunal arm via indwelling gastrostomy tube 11/07/2020 per interventional radiology.    -Continue Protonix twice daily, MiraLAX twice daily, Colace twice daily.   9.  Iron deficiency anemia.    Hemoglobin 11.2 on 11/24  Continue to monitor 10.  Schizophrenia/intellectual delay/seizure disorder.  Valproic acid 250 mg twice daily, Lamictal 100 mg daily, seroquel 400mg  bid  No breakthrough seizures since admission to CIR-11/27 11.  Hypothyroidism.  Synthroid 12.  Type 2 diabetes mellitus with hyperglycemia, compounded by tube feeds.  Hemoglobin A1c  5.8.  SSI.  Continue SSI  Labile on 11/27, monitor for trend 13.  Hyperlipidemia.  Crestor 14.  History of CVA with left-sided residual weakness continue therapies as directed 15.  Dysphagia  N.p.o. at present  Continue tube feeds via PEG  See #8  CT angio chest personally reviewed-esophagus looks relatively stable, esophagram unremarkable for leaks, plan to proceed with MBS early next week-discussed with therapies  16.  Hyponatremia  Sodium 131 on 11/26, labs ordered for Monday  LOS: 4 days A FACE TO FACE EVALUATION WAS PERFORMED  Blaize Epple Karis Juba 11/19/2020, 1:07 PM

## 2020-11-19 NOTE — Progress Notes (Signed)
Occupational Therapy Session Note  Patient Details  Name: [name] MRN: [id number] Date of Birth: Jun 10, 1956  Today's Date: 11/19/2020 OT Individual Time: 1300-1355 OT Individual Time Calculation (min): 55 min    Short Term Goals: Week 1:  OT Short Term Goal 1 (Week 1): Pt will wait to initiate transfer till after w/c brakes are locked to demo improved safety awareness wiht no VC OT Short Term Goal 2 (Week 1): Pt will transfer to toielt wiht CGA OT Short Term Goal 3 (Week 1): Pt will don LB clothing wiht A only for balance OT Short Term Goal 4 (Week 1): Pt will complete 2/3 steps of toileting OT Short Term Goal 5 (Week 1): Pt will bathe with MIN A and no more than min VC for sequencing/thoroughness  Skilled Therapeutic Interventions/Progress Updates:    1:1. Pt received in bed agreeable to Ot and shower with no pain. Pt ambulates throughout session with MIN A occasional MOD A for balance during initial sit to stand as pt does not position feet well prior to power up despite cues. Pt completes toileting with MIN A for standing balnce during clothing management and hygeine. Pt bathes sit to stand with A for standing balnce and VC for safety awareness as pt leans forward and touches grab bar with head trying to doff pants prior to shower. VC for figure 4 to wash feet to prevent pt from hitting head. Pt completes LB dressing sit to stand with A for standing balance and requires VC for seated donning for shirt except stands to pull down back. MIN VC for orinetation of clothing to self. Pt practices ADL apartent transfers: couch, recliner and step in shower with MOD A from low surfaces and stepping over ledge. Exited session with pt seated in bed, exit alarm on and call light in reach   Therapy Documentation Precautions:  Precautions Precautions: Fall Precaution Comments: L hemiparesis, PEG Restrictions Weight Bearing Restrictions: No General:   Vital Signs:   Pain: Pain  Assessment Pain Scale: 0-10 Pain Score: 0-No pain ADL: ADL Grooming: Minimal assistance Where Assessed-Grooming: Sitting at sink Upper Body Bathing: Minimal assistance Where Assessed-Upper Body Bathing: Sitting at sink Lower Body Bathing: Moderate assistance Where Assessed-Lower Body Bathing: Sitting at sink, Standing at sink Upper Body Dressing: Moderate assistance Where Assessed-Upper Body Dressing: Sitting at sink Lower Body Dressing: Maximal assistance Where Assessed-Lower Body Dressing: Standing at sink, Sitting at sink Toileting: Maximal assistance Where Assessed-Toileting: Teacher, adult education: Curator Method: Surveyor, minerals: Scientist, research (medical)    Praxis   Exercises:   Other Treatments:     Therapy/Group: Individual Therapy  [name] 11/19/2020, 1:56 PM

## 2020-11-19 NOTE — Plan of Care (Signed)
Patient is alert, able to express his needs. Voids in urinal and uses bedpan. Denies any pain. Has left sided weakness d/t stroke, on continuous tube feeds pending speech screen. Patient HOB up to 30 degrees. Blood sugars checked and SSI given per order.  Problem: Consults Goal: RH GENERAL PATIENT EDUCATION Description: See Patient Education module for education specifics. Outcome: Progressing   Problem: RH BLADDER ELIMINATION Goal: RH STG MANAGE BLADDER WITH ASSISTANCE Description: STG Manage Bladder With min Assistance Outcome: Progressing   Problem: RH SKIN INTEGRITY Goal: RH STG MAINTAIN SKIN INTEGRITY WITH ASSISTANCE Description: STG Maintain Skin Integrity With min Assistance. Outcome: Progressing   Problem: RH SAFETY Goal: RH STG ADHERE TO SAFETY PRECAUTIONS W/ASSISTANCE/DEVICE Description: STG Adhere to Safety Precautions With cues/reminders. Outcome: Progressing   Problem: RH KNOWLEDGE DEFICIT GENERAL Goal: RH STG INCREASE KNOWLEDGE OF SELF CARE AFTER HOSPITALIZATION Description: Pt and cg will be able to verbalize understanding of educated materials upon discharge. Outcome: Progressing

## 2020-11-20 ENCOUNTER — Inpatient Hospital Stay (HOSPITAL_COMMUNITY): Payer: Medicare Other

## 2020-11-20 DIAGNOSIS — E871 Hypo-osmolality and hyponatremia: Secondary | ICD-10-CM | POA: Diagnosis not present

## 2020-11-20 DIAGNOSIS — R5381 Other malaise: Secondary | ICD-10-CM | POA: Diagnosis not present

## 2020-11-20 DIAGNOSIS — Z931 Gastrostomy status: Secondary | ICD-10-CM | POA: Diagnosis not present

## 2020-11-20 DIAGNOSIS — R1310 Dysphagia, unspecified: Secondary | ICD-10-CM | POA: Diagnosis not present

## 2020-11-20 LAB — GLUCOSE, CAPILLARY
Glucose-Capillary: 144 mg/dL — ABNORMAL HIGH (ref 70–99)
Glucose-Capillary: 152 mg/dL — ABNORMAL HIGH (ref 70–99)
Glucose-Capillary: 155 mg/dL — ABNORMAL HIGH (ref 70–99)
Glucose-Capillary: 162 mg/dL — ABNORMAL HIGH (ref 70–99)
Glucose-Capillary: 176 mg/dL — ABNORMAL HIGH (ref 70–99)
Glucose-Capillary: 88 mg/dL (ref 70–99)

## 2020-11-20 NOTE — Progress Notes (Signed)
Occupational Therapy Session Note  Patient Details  Name: [name] MRN: [id number] Date of Birth: [birth year]  Today's Date: 11/20/2020 OT Individual Time: 9733-1250 OT Individual Time Calculation (min): 60 min    Short Term Goals: Week 1:  OT Short Term Goal 1 (Week 1): Pt will wait to initiate transfer till after w/c brakes are locked to demo improved safety awareness wiht no VC OT Short Term Goal 2 (Week 1): Pt will transfer to toielt wiht CGA OT Short Term Goal 3 (Week 1): Pt will don LB clothing wiht A only for balance OT Short Term Goal 4 (Week 1): Pt will complete 2/3 steps of toileting OT Short Term Goal 5 (Week 1): Pt will bathe with MIN A and no more than min VC for sequencing/thoroughness  Skilled Therapeutic Interventions/Progress Updates:    Pt received supine with RN connecting, then disconnecting feeding line to PEG for session. Pt with no c/o pain and agreeable to session. Pt completed stand pivot transfers with min A to the w/c. Pt impulsively and quickly moving throughout session. Pt requested to use the bathroom. Stand pivot with min A to standard, low toilet- grab bar use for stabilization. Min A for toileting tasks overall but no BM void as he intended. Pt doffed socks and pants with min A for sitting balance. He transferred into the shower with min HHA. UB bathing with min A to reach under LUE, as his RUE assisted with shoulder flexion. Pt doing nice job of integrating the LUE into functional tasks. Standing level peri hygiene with min A for standing balance. Pt transferred back to toilet in similar fashion after drying off, again no BM void. Pt requested to wear a gown instead of dressing since this was his last session of the day. Pt difficult to understand d/t dysarthria however unsure of baseline speech intelligibility. Pt completed ambulatory transfer in room back to bed with RW with L orthosis with min A, progressing to CGA. Pt returned to bed and the drain  sponge was replaced on his peg. He reported urge to use the bathroom again and completed stand pivot to the [facility] for time management. Again, no void. Pt was left supine with all needs met, bed alarm set.   Therapy Documentation Precautions:  Precautions Precautions: Fall Precaution Comments: L hemiparesis, PEG Restrictions Weight Bearing Restrictions: No   Therapy/Group: Individual Therapy  Curtis Sites 11/20/2020, 7:25 AM

## 2020-11-20 NOTE — Progress Notes (Signed)
[facility] PHYSICAL MEDICINE & REHABILITATION PROGRESS NOTE   Subjective/Complaints: Patient seen laying in bed this AM.  He states he slept well overnight.  No reported issues overnight.  Denies complaints.   ROS: Denies CP, SOB, N/V/D  Objective:   DG ESOPHAGUS W SINGLE CM (SOL OR THIN BA)  Result Date: 11/18/2020 CLINICAL DATA:  History of hiatal hernia repair.  Evaluate for leak. EXAM: ESOPHAGUS/BARIUM SWALLOW/TABLET STUDY TECHNIQUE: Initial scout AP supine abdominal image obtained to insure adequate colon cleansing. Barium was introduced into the colon in a retrograde fashion and refluxed from the rectum to the cecum. Spot images of the colon followed by overhead radiographs were obtained. FLUOROSCOPY TIME:  Fluoroscopy Time:  1 minutes 18 seconds Radiation Exposure Index (if provided by the fluoroscopic device): 10.1 mGy Number of Acquired Spot Images: 0 COMPARISON:  None. FINDINGS: Patient is unable to stand. A modified, limited exam was performed with the patient in the semi upright LPO orientation. Initially several small sips of water-soluble contrast material was ingested by the patient. This passed through the esophagus and into the stomach without difficulty. No aspiration noted. The patient was then given thin barium which also easily passed through the esophagus and into the stomach. No extravasation of contrast material identified to suggest postoperative leak. The distal esophagus appears increased in caliber and patulous. Fundoplication wrap appears intact. IMPRESSION: 1. No signs of postoperative leak status post hiatal hernia repair. Electronically Signed   By: Signa Kell M.D.   On: 11/18/2020 14:02   No results for input(s): WBC, HGB, HCT, PLT in the last 72 hours. Recent Labs    11/18/20 0449  NA 131*  K 4.7  CL 94*  CO2 26  GLUCOSE 179*  BUN 16  CREATININE 0.93  CALCIUM 9.3    Intake/Output Summary (Last 24 hours) at 11/20/2020 1300 Last data filed at  11/20/2020 0900 Gross per 24 hour  Intake --  Output 425 ml  Net -425 ml        Physical Exam: Vital Signs Blood pressure 116/78, pulse 91, temperature 97.9 F (36.6 C), resp. rate 18, height 5\' 4"  (1.626 m), weight 66.5 kg, SpO2 96 %.  Constitutional: No distress . Vital signs reviewed. HENT: Normocephalic.  Atraumatic. Eyes: EOMI. No discharge. Cardiovascular: No JVD.  RRR. Respiratory: Normal effort.  No stridor.  Bilateral clear to auscultation. GI: Non-distended.  BS +. +PEG. Skin: Warm and dry.  Intact. PEG site CDI. Psych: Flat. Normal behavior. Musc: No edema in extremities.  No tenderness in extremities. Neuro: Alert Dysarthria, unchanged Motor: RUE/RLE: 4+/5 proximal distal LUE: 4/5 proximal to distal, unchanged LLE: 4/5 hip flexion, knee extension, 1/5 ankle dorsiflexion  Assessment/Plan: 1. Functional deficits which require 3+ hours per day of interdisciplinary therapy in a comprehensive inpatient rehab setting.  Physiatrist is providing close team supervision and 24 hour management of active medical problems listed below.  Physiatrist and rehab team continue to assess barriers to discharge/monitor patient progress toward functional and medical goals  Care Tool:  Bathing    Body parts bathed by patient: Left arm, Chest, Abdomen, Front perineal area, Right upper leg, Left upper leg, Face, Buttocks, Right lower leg, Left lower leg, Right arm   Body parts bathed by helper: Right arm, Left arm, Buttocks, Left upper leg, Right lower leg, Right upper leg, Left lower leg     Bathing assist Assist Level: Minimal Assistance - Patient > 75%     Upper Body Dressing/Undressing Upper body dressing  What is the patient wearing?: Pull over shirt    Upper body assist Assist Level: Contact Guard/Touching assist (standing balance)    Lower Body Dressing/Undressing Lower body dressing      What is the patient wearing?: Underwear/pull up, Pants     Lower body  assist Assist for lower body dressing: Minimal Assistance - Patient > 75%     Toileting Toileting    Toileting assist Assist for toileting: Minimal Assistance - Patient > 75%     Transfers Chair/bed transfer  Transfers assist     Chair/bed transfer assist level: Contact Guard/Touching assist Chair/bed transfer assistive device: Geologist, engineering   Ambulation assist      Assist level: Minimal Assistance - Patient > 75% Assistive device: No Device Max distance: 80 ft   Walk 10 feet activity   Assist     Assist level: Minimal Assistance - Patient > 75% Assistive device: No Device   Walk 50 feet activity   Assist Walk 50 feet with 2 turns activity did not occur: Safety/medical concerns (fatigue, weakness, decreased balance/postural control)  Assist level: Minimal Assistance - Patient > 75% Assistive device: No Device    Walk 150 feet activity   Assist Walk 150 feet activity did not occur: Safety/medical concerns (fatigue, weakness, decreased balance/postural control)         Walk 10 feet on uneven surface  activity   Assist Walk 10 feet on uneven surfaces activity did not occur: Safety/medical concerns (fatigue, weakness, decreased balance/postural control, poor safety awareness)         Wheelchair     Assist Will patient use wheelchair at discharge?: Yes Type of Wheelchair: Manual Wheelchair activity did not occur: Refused  Wheelchair assist level: Dependent - Patient 0%      Wheelchair 50 feet with 2 turns activity    Assist        Assist Level: Dependent - Patient 0%   Wheelchair 150 feet activity     Assist      Assist Level: Dependent - Patient 0%   Blood pressure 116/78, pulse 91, temperature 97.9 F (36.6 C), resp. rate 18, height 5\' 4"  (1.626 m), weight 66.5 kg, SpO2 96 %.  Medical Problem List and Plan: 1.  Decreased functional mobility secondary to acute hypoxic hypercapnic respiratory failure  with aspiration/ventilatory support pneumonia  Continue CIR 2.  Antithrombotics: -DVT/anticoagulation: Subcutaneous heparin             -antiplatelet therapy: N/A 3. Pain Management: Oxycodone as needed 4. Mood: no SSRIs             -antipsychotic agents: Seroquel 400 mg twice daily 5. Neuropsych: This patient is not capable of making decisions on his own behalf. 6. Skin/Wound Care: Routine skin checks 7. Fluids/Electrolytes/Nutrition: Routine I/Os 8.  Paraesophageal hernia/massively dilated esophagus.  Status post hernia repair percutaneous gastrostomy tube placement 10/26/2020 per cardiothoracic surgery Dr. 13/02/2020 changed to placement of jejunal arm via indwelling gastrostomy tube 11/07/2020 per interventional radiology.    -Continue Protonix twice daily, MiraLAX twice daily, Colace twice daily.   9.  Iron deficiency anemia.    Hemoglobin 11.2 on 11/24  Continue to monitor 10.  Schizophrenia/intellectual delay/seizure disorder.  Valproic acid 250 mg twice daily, Lamictal 100 mg daily, seroquel 400mg  bid  No breakthrough seizures since admission to CIR-11/28 11.  Hypothyroidism.  Synthroid 12.  Type 2 diabetes mellitus with hyperglycemia, confounded by tube feeds.  Hemoglobin A1c 5.8.  SSI.  Continue SSI  Elevated on 11/28, monitor for trend  Will consider medications if persistent. 13.  Hyperlipidemia.  Crestor 14.  History of CVA with left-sided residual weakness continue therapies as directed 15.  Dysphagia  NPO at present  Continue tube feeds via PEG  See #8  CT angio chest personally reviewed-esophagus looks relatively stable, esophagram unremarkable for leaks, plan to proceed with MBS early this week-discussed with therapies previously 16.  Hyponatremia  Sodium 131 on 11/26, labs ordered for tomorrow  LOS: 5 days A FACE TO FACE EVALUATION WAS PERFORMED  Michaelle Bottomley Karis Juba 11/20/2020, 1:00 PM

## 2020-11-20 NOTE — Progress Notes (Signed)
Speech Language Pathology Daily Session Note  Patient Details  Name: KIPP SHANK MRN: [id number] Date of Birth: [birth year]  Today's Date: 11/20/2020 SLP Individual Time: 0901-1000 SLP Individual Time Calculation (min): 59 min  Short Term Goals: Week 1: SLP Short Term Goal 1 (Week 1): Pending instrumental assessment, patient will tolerate trials of safest and least restrictive diet with no overt s/s aspiration and min A cues for use of compensatory strategies SLP Short Term Goal 2 (Week 1): Pt will tolerate PO trials with SLP only with min A and no overt s/s aspiration  Skilled Therapeutic Interventions: Pt seen for skilled ST focused on dysphagia goals, HOB raised to 70 degrees before patient protest. SLP facilitated session by providing occ verbal and visual cues for problem solving and thoroughness during oral care via suction toothbrush. Pt participating in diet texture analysis of ice chips, puree and thin liquids. Pt continues with mild delayed AP transit with suspected tongue pumping with ice chips and purees, no s/s aspiration across any trials this date. Pt benefits from alternating solids and liquids at 2:1 ratio. Pt re-educated on continuing NPO recommendation with plans for instrumental assessment of swallow prior to initiating diet. Pt verbalized understanding. Pt left upright in bed with alarm on and all needs within reach. Cont with ST POC.   Pain Pain Assessment Pain Scale: 0-10 Pain Score: 0-No pain  Therapy/Group: Individual Therapy  Tacey Ruiz 11/20/2020, 9:42 AM

## 2020-11-20 NOTE — Progress Notes (Signed)
Physical Therapy Session Note  Patient Details  Name: [name] MRN: [id number] Date of Birth: 1956-11-18  Today's Date: 11/20/2020 PT Individual Time: 1345-1445 PT Individual Time Calculation (min): 60 min   Short Term Goals: Week 1:  PT Short Term Goal 1 (Week 1): pt will transfer bed<>chair with LRAD and CGA PT Short Term Goal 2 (Week 1): Pt will ambulate 46ft with LRAD and CGA PT Short Term Goal 3 (Week 1): Pt will perform simulated car transfer with LRAD and min A  Skilled Therapeutic Interventions/Progress Updates:     Patient in bed upon PT arrival. Patient alert and agreeable to PT session. Patient denied pain during session. RN unhooked patient from tube feed during therapy session, notified upon patient return at end of session. Patient initially requesting to rest today. PT   Therapeutic Activity: Bed Mobility: Patient performed supine to sit with supervision with increased time and effort in a flat bed without use of bed rail. Provided verbal cues for setting R elbow to push to sit up. Patient motivated by dressing in his personal clothing. Worked on dynamic sitting balance while performing a functional task seated EOB. Patient donned pants with mod-max A, able to thread lower extremities with PT holding pants initially and required assist with pulling pants over his brief in standing. He doffed a hospital gown with min A and donned a long sleeve pull-over shirt with mod A and min cues for sequencing for hemi-technique. He doffed non-skid socks and donned regular socks with supervision, providing cues for improved figure sitting, and donned his L shoe with supervision and R with min A for placement. Performed all dressing with supervision for sitting balance EOB.  Transfers: Patient performed sit to/from stand x2, stand pivot x1, and ambulatory transfer to/form the toilet in the bathroom x1 with CGA-min A without AD. Provided verbal cues for safety, timing, and placement  of L foot, patient continue to be impulsive with transfers, but responds to verbal cues to wait for therapist. Patient was continent of bowl and bladder during toileting, performed peri-care with mod I and lower body clothing management with mod-max A due to decreased balance and functional use of L upper extremity.  Gait Training:  Patient ambulated ~150 feet x2 on unlevel sidewalk without an AD with min A-CGA. Ambulated with decreased clearance of L foot with reduced L knee/hip flexion in swing, mild circumduction gait on L, R lateral lean when stepping with L, and increased L upper extremity flexor tone. Provided verbal cues for looking ahead, increased knee flexion and foot clearance on L, facilitated and cued increased L weight shift promoting step-through gait pattern with increased R step length.  Wheelchair Mobility:  Patient was transported in the w/c with total A to outdoor courtyard outside the [facility] for improved patient emotional well being during session..  Patient sat outside and discussed hobbies and chores he performs at home. Patient reports he cuts the grass, vacuumes, washes windows, plays chess and card games, and enjoys sitting outside. Patient with simple one word answers and flat affect throughout.   Patient in w/c, agreeable to sit-up, at end of session with breaks locked, seat belt alarm set, and all needs within reach.    Therapy Documentation Precautions:  Precautions Precautions: Fall Precaution Comments: L hemiparesis, PEG Restrictions Weight Bearing Restrictions: No   Therapy/Group: Individual Therapy  Chiyeko Ferre L Yeila Morro PT, DPT  11/20/2020, 3:38 PM

## 2020-11-21 ENCOUNTER — Inpatient Hospital Stay (HOSPITAL_COMMUNITY): Payer: Medicare Other | Admitting: Speech Pathology

## 2020-11-21 ENCOUNTER — Inpatient Hospital Stay (HOSPITAL_COMMUNITY): Payer: Medicare Other

## 2020-11-21 DIAGNOSIS — R5381 Other malaise: Secondary | ICD-10-CM | POA: Diagnosis not present

## 2020-11-21 LAB — GLUCOSE, CAPILLARY
Glucose-Capillary: 121 mg/dL — ABNORMAL HIGH (ref 70–99)
Glucose-Capillary: 123 mg/dL — ABNORMAL HIGH (ref 70–99)
Glucose-Capillary: 124 mg/dL — ABNORMAL HIGH (ref 70–99)
Glucose-Capillary: 137 mg/dL — ABNORMAL HIGH (ref 70–99)
Glucose-Capillary: 155 mg/dL — ABNORMAL HIGH (ref 70–99)
Glucose-Capillary: 165 mg/dL — ABNORMAL HIGH (ref 70–99)
Glucose-Capillary: 179 mg/dL — ABNORMAL HIGH (ref 70–99)

## 2020-11-21 LAB — BASIC METABOLIC PANEL
Anion gap: 12 (ref 5–15)
BUN: 14 mg/dL (ref 8–23)
CO2: 26 mmol/L (ref 22–32)
Calcium: 9.2 mg/dL (ref 8.9–10.3)
Chloride: 94 mmol/L — ABNORMAL LOW (ref 98–111)
Creatinine, Ser: 1 mg/dL (ref 0.61–1.24)
GFR, Estimated: >60 mL/min (ref >60)
Glucose, Bld: 155 mg/dL — ABNORMAL HIGH (ref 70–99)
Potassium: 4.3 mmol/L (ref 3.5–5.1)
Sodium: 132 mmol/L — ABNORMAL LOW (ref 135–145)

## 2020-11-21 NOTE — Progress Notes (Signed)
Occupational Therapy Session Note  Patient Details  Name: [name] MRN: [id number] Date of Birth: 08/30/1956  Today's Date: 11/21/2020 OT Individual Time: 1355-1459 OT Individual Time Calculation (min): 64 min  and Today's Date: 11/21/2020 OT Missed Time: 10 Minutes Missed Time Reason: Patient unwilling/refused to participate without medical reason   Short Term Goals: Week 1:  OT Short Term Goal 1 (Week 1): Pt will wait to initiate transfer till after w/c brakes are locked to demo improved safety awareness wiht no VC OT Short Term Goal 2 (Week 1): Pt will transfer to toielt wiht CGA OT Short Term Goal 3 (Week 1): Pt will don LB clothing wiht A only for balance OT Short Term Goal 4 (Week 1): Pt will complete 2/3 steps of toileting OT Short Term Goal 5 (Week 1): Pt will bathe with MIN A and no more than min VC for sequencing/thoroughness  Skilled Therapeutic Interventions/Progress Updates:    Pt received supine with no c/o pain. Pt agreeable to take shower. Pt reporting need to use the bathroom first. Ambulatory transfer into the bathroom with RW use, CGA for safety. Pt voided loose BM and completed peri hygiene seated with large lateral lean requiring CGA for safety. Pt completed ambulatory transfer into the shower with min HHA. He completed all bathing with min cueing for thoroughness with CGA. Pt required cueing for safety to dry off completely before transferring out of shower. Pt donned new shirt with min A and cueing for hemi technique. Pants donned with CGA. Socks set up assist. Drain sponge dressing changed on PEG. Pt completed oral care supine with set up assist. Several ice chips administered after approval by SLP. Pt completed another BM attempt, similar assist. After much encouragement pt agreeable to OOB mobility, however quickly declined any functional mobility and insisted on w/c ride to therapy gym. In the gym, BITS system used for sequencing and functional reaching. Pt  alternated R and L with L requiring min facilitation with high associated and compensatory movements. Pt now becoming more fatigued and declining further activity, getting frustrated with OT. Pt was taken back to his room, completing 100 ft of functional mobility without AD, requiring CGA initially and progressing to min A 2/2 fatigue. Pt was left supine with al needs met, bed alarm set.   Therapy Documentation Precautions:  Precautions Precautions: Fall Precaution Comments: L hemiparesis, PEG Restrictions Weight Bearing Restrictions: No   Therapy/Group: Individual Therapy   H  11/21/2020, 6:41 AM  

## 2020-11-21 NOTE — Progress Notes (Signed)
Speech Language Pathology Daily Session Note  Patient Details  Name: [name] MRN: [id number] Date of Birth: Sep 19, 1956  Today's Date: 11/21/2020 SLP Individual Time: 1100-1145 SLP Individual Time Calculation (min): 45 min  Short Term Goals: Week 1: SLP Short Term Goal 1 (Week 1): Pending instrumental assessment, patient will tolerate trials of safest and least restrictive diet with no overt s/s aspiration and min A cues for use of compensatory strategies SLP Short Term Goal 2 (Week 1): Pt will tolerate PO trials with SLP only with min A and no overt s/s aspiration  Skilled Therapeutic Interventions: Skilled treatment session focused on dysphagia goals. SLP facilitated session by providing Mod A verbal cues for problem solving during oral care via the suction toothbrush. Patient consumed trials of ice chips, thin liquids and dys. 1 textures without overt s/s of aspiration with mildly prolonged mastication due to tongue pumping. An MBS is scheduled for tomorrow to assess swallow function and diet initiation. Patient left upright in the recliner with alarm on and all needs within reach. Continue with current plan of care.      Pain No/Denies Pain   Therapy/Group: Individual Therapy  Rhena Glace 11/21/2020, 1:19 PM

## 2020-11-21 NOTE — Progress Notes (Signed)
Physical Therapy Session Note  Patient Details  Name: [name] MRN: [id number] Date of Birth: 10-Jan-1956  Today's Date: 11/21/2020 PT Individual Time: 0955-1050 PT Individual Time Calculation (min): 55 min   Short Term Goals: Week 1:  PT Short Term Goal 1 (Week 1): pt will transfer bed<>chair with LRAD and CGA PT Short Term Goal 2 (Week 1): Pt will ambulate 58ft with LRAD and CGA PT Short Term Goal 3 (Week 1): Pt will perform simulated car transfer with LRAD and min A  Skilled Therapeutic Interventions/Progress Updates:     Patient in bed asleep upon PT arrival. Patient alert and agreeable to PT session. Patient denied pain during session. RN unhooked tube feed prior to mobility, and notified when patient returned to the room to restart tube feed.   Therapeutic Activity: Bed Mobility: Patient performed supine to/from sit with supervision with HOB elevated and use of bed rail. Patient sat EOB working on dynamic sitting balance during dressing. Donned a long sleeve shirt with mod A for L arm management and pulling his shirt over his PEG tube, donned pants with mod A, able to thread he left leg through, but required assist for his R, provided cues for increased use of L hand to assist, donned B socks with supervision and B tennis shoes with mod A for time management/energy conservation.  Transfers: Patient performed sit to/from stand x8 and stand pivot x2 with close supervision without an AD. Provided verbal cues for L foot placement and controlled mobility for improved balance and reduced impulsivity. Patient performed a ambulatory transfer to the bathroom with CGA-close supervision. He was continent of bowl and bladder during toileting, performed peri-care independently and required mod A for lower body clothing management. He stood at the sink to perform hand hygiene after with close supervision for balance.   Gait Training:  Encouraged patient to ambulate to the Day room today.  Patient adamant about use the w/c for transport to the Day room. Educated patient on benefits of increased mobility for improved functional independence and endurance. Patient continued to insist on using the w/c to go to the Day room then stated he would ambulate from there. Transported patient in the w/c to the Day room with total A for time/energy management. Patient ambulated 180 feet x2 and 120 feet without an AD with CGA for safety/balance and intermittent min A due to L toe catching with decreased L foot clearance. Ambulated with reduced circumduction gait and improved foot clearance on second trial following NMR, returned on third trial due to fatigue. Provided verbal and tactile cues for increased L hamstring activation for improved knee flexion and foot clearance in swing and increased L weight shift to promote increased R step length for equal stride length.  Neuromuscular Re-ed: Patient performed the following standing balance and lower extremity motor control activities: -step taps R/L 2x10 on 6" step, patient initially unable to clear L foot and would sit spontaneously after failed attempts, required increased encouragement and instructions to complete task -sit to/from stand x10 focused on eccentric control on descent and equal weight bearing due to COM over his R leg during transfers  Patient in w/c in the room at end of session with breaks locked, seat belt alarm set, and all needs within reach.    Therapy Documentation Precautions:  Precautions Precautions: Fall Precaution Comments: L hemiparesis, PEG Restrictions Weight Bearing Restrictions: No   Therapy/Group: Individual Therapy  Breanah Faddis L Shyleigh Daughtry PT, DPT  11/21/2020, 4:42 PM

## 2020-11-21 NOTE — Progress Notes (Signed)
[facility] PHYSICAL MEDICINE & REHABILITATION PROGRESS NOTE   Subjective/Complaints: No complaints this morning. Denies pain, constipation, insomnia.  Makes good eye contact  ROS: Denies CP, SOB, N/V/D  Objective:   No results found. No results for input(s): WBC, HGB, HCT, PLT in the last 72 hours. Recent Labs    11/21/20 0754  NA 132*  K 4.3  CL 94*  CO2 26  GLUCOSE 155*  BUN 14  CREATININE 1.00  CALCIUM 9.2    Intake/Output Summary (Last 24 hours) at 11/21/2020 0948 Last data filed at 11/21/2020 0553 Gross per 24 hour  Intake 75 ml  Output 325 ml  Net -250 ml   Physical Exam: Vital Signs Blood pressure 115/80, pulse 93, temperature 98.2 F (36.8 C), temperature source Oral, resp. rate 18, height 5\' 4"  (1.626 m), weight 67.9 kg, SpO2 97 %.  General: Alert and oriented x 3, No apparent distress HEENT: Head is normocephalic, atraumatic, PERRLA, EOMI, sclera anicteric, oral mucosa pink and moist, dentition intact, ext ear canals clear,  Neck: Supple without JVD or lymphadenopathy Heart: Reg rate and rhythm. No murmurs rubs or gallops Chest: CTA bilaterally without wheezes, rales, or rhonchi; no distress GI: Non-distended.  BS +. +PEG. Skin: Warm and dry.  Intact. PEG site CDI. Psych: Flat. Normal behavior. Musc: No edema in extremities.  No tenderness in extremities. Neuro: Alert Dysarthria, unchanged Motor: RUE/RLE: 4+/5 proximal distal LUE: 4/5 proximal to distal, unchanged LLE: 4/5 hip flexion, knee extension, 1/5 ankle dorsiflexion  Assessment/Plan: 1. Functional deficits which require 3+ hours per day of interdisciplinary therapy in a comprehensive inpatient rehab setting.  Physiatrist is providing close team supervision and 24 hour management of active medical problems listed below.  Physiatrist and rehab team continue to assess barriers to discharge/monitor patient progress toward functional and medical goals  Care Tool:  Bathing    Body parts  bathed by patient: Left arm, Chest, Abdomen, Front perineal area, Right upper leg, Left upper leg, Face, Buttocks, Right lower leg, Left lower leg, Right arm   Body parts bathed by helper: Right arm, Left arm, Buttocks, Left upper leg, Right lower leg, Right upper leg, Left lower leg     Bathing assist Assist Level: Minimal Assistance - Patient > 75%     Upper Body Dressing/Undressing Upper body dressing   What is the patient wearing?: Pull over shirt    Upper body assist Assist Level: Minimal Assistance - Patient > 75%    Lower Body Dressing/Undressing Lower body dressing      What is the patient wearing?: Pants     Lower body assist Assist for lower body dressing: Moderate Assistance - Patient 50 - 74%     Toileting Toileting    Toileting assist Assist for toileting: Minimal Assistance - Patient > 75%     Transfers Chair/bed transfer  Transfers assist     Chair/bed transfer assist level: Contact Guard/Touching assist Chair/bed transfer assistive device:   Ambulation assist      Assist level: Minimal Assistance - Patient > 75% Assistive device: No Device Max distance: 150 ft   Walk 10 feet activity   Assist     Assist level: Minimal Assistance - Patient > 75% Assistive device: No Device   Walk 50 feet activity   Assist Walk 50 feet with 2 turns activity did not occur: Safety/medical concerns (fatigue, weakness, decreased balance/postural control)  Assist level: Minimal Assistance - Patient > 75% Assistive device: No Device  Walk 150 feet activity   Assist Walk 150 feet activity did not occur: Safety/medical concerns (fatigue, weakness, decreased balance/postural control)  Assist level: Minimal Assistance - Patient > 75% Assistive device: No Device    Walk 10 feet on uneven surface  activity   Assist Walk 10 feet on uneven surfaces activity did not occur: Safety/medical concerns (fatigue, weakness,  decreased balance/postural control, poor safety awareness)         Wheelchair     Assist Will patient use wheelchair at discharge?: Yes Type of Wheelchair: Manual Wheelchair activity did not occur: Refused  Wheelchair assist level: Dependent - Patient 0%      Wheelchair 50 feet with 2 turns activity    Assist        Assist Level: Dependent - Patient 0%   Wheelchair 150 feet activity     Assist      Assist Level: Dependent - Patient 0%   Blood pressure 115/80, pulse 93, temperature 98.2 F (36.8 C), temperature source Oral, resp. rate 18, height 5\' 4"  (1.626 m), weight 67.9 kg, SpO2 97 %.  Medical Problem List and Plan: 1.  Decreased functional mobility secondary to acute hypoxic hypercapnic respiratory failure with aspiration/ventilatory support pneumonia  Continue CIR 2.  Antithrombotics: -DVT/anticoagulation: Subcutaneous heparin             -antiplatelet therapy: N/A 3. Pain Management: Oxycodone as needed. Pain is well controlled. Not using oxycodone- will d/c 4. Mood: no SSRIs             -antipsychotic agents: Seroquel 400 mg twice daily 5. Neuropsych: This patient is not capable of making decisions on his own behalf. 6. Skin/Wound Care: Routine skin checks 7. Fluids/Electrolytes/Nutrition: Routine I/Os 8.  Paraesophageal hernia/massively dilated esophagus.  Status post hernia repair percutaneous gastrostomy tube placement 10/26/2020 per cardiothoracic surgery Dr. 13/02/2020 changed to placement of jejunal arm via indwelling gastrostomy tube 11/07/2020 per interventional radiology.    -Continue Protonix twice daily, MiraLAX twice daily, Colace twice daily.   9.  Iron deficiency anemia.    Hemoglobin 11.2 on 11/24  Continue to monitor 10.  Schizophrenia/intellectual delay/seizure disorder.  Valproic acid 250 mg twice daily, Lamictal 100 mg daily, seroquel 400mg  bid  No breakthrough seizures since admission to CIR-11/28 11.  Hypothyroidism.   Synthroid 12.  Type 2 diabetes mellitus with hyperglycemia, confounded by tube feeds.  Hemoglobin A1c 5.8.  SSI.  Continue SSI  Elevated on 11/29 to 155- improving, continue to monitor.  13.  Hyperlipidemia.  Crestor 14.  History of CVA with left-sided residual weakness continue therapies as directed 15.  Dysphagia  NPO at present  Continue tube feeds via PEG  See #8  CT angio chest personally reviewed-esophagus looks relatively stable, esophagram unremarkable for leaks, plan to proceed with MBS early this week-discussed with therapies previously 16.  Hyponatremia  Sodium 131 on 11/26, up to 132 on 11/29.   LOS: 6 days A FACE TO FACE EVALUATION WAS PERFORMED  12/26 Lashaye Fisk 11/21/2020, 9:48 AM

## 2020-11-22 ENCOUNTER — Inpatient Hospital Stay (HOSPITAL_COMMUNITY): Payer: Medicare Other

## 2020-11-22 ENCOUNTER — Inpatient Hospital Stay (HOSPITAL_COMMUNITY): Payer: Medicare Other | Admitting: Physical Therapy

## 2020-11-22 ENCOUNTER — Encounter (HOSPITAL_COMMUNITY): Payer: Medicare Other

## 2020-11-22 DIAGNOSIS — R5381 Other malaise: Secondary | ICD-10-CM | POA: Diagnosis not present

## 2020-11-22 LAB — GLUCOSE, CAPILLARY
Glucose-Capillary: 111 mg/dL — ABNORMAL HIGH (ref 70–99)
Glucose-Capillary: 131 mg/dL — ABNORMAL HIGH (ref 70–99)
Glucose-Capillary: 144 mg/dL — ABNORMAL HIGH (ref 70–99)
Glucose-Capillary: 160 mg/dL — ABNORMAL HIGH (ref 70–99)
Glucose-Capillary: 166 mg/dL — ABNORMAL HIGH (ref 70–99)
Glucose-Capillary: 171 mg/dL — ABNORMAL HIGH (ref 70–99)

## 2020-11-22 NOTE — Progress Notes (Signed)
PHYSICAL MEDICINE & REHABILITATION PROGRESS NOTE   Subjective/Complaints: No complaints this morning. Denies pain, constipation, insomnia.  Using the bathroom with nursing  ROS: Denies CP, SOB, N/V/D  Objective:   No results found. No results for input(s): WBC, HGB, HCT, PLT in the last 72 hours. Recent Labs    11/21/20 0754  NA 132*  K 4.3  CL 94*  CO2 26  GLUCOSE 155*  BUN 14  CREATININE 1.00  CALCIUM 9.2    Intake/Output Summary (Last 24 hours) at 11/22/2020 0956 Last data filed at 11/22/2020 0536 Gross per 24 hour  Intake --  Output 750 ml  Net -750 ml   Physical Exam: Vital Signs Blood pressure 129/80, pulse 95, temperature 99.4 F (37.4 C), temperature source Oral, resp. rate 18, height 5\' 4"  (1.626 m), weight 65.9 kg, SpO2 97 %.  Gen: no distress, normal appearing HEENT: oral mucosa pink and moist, NCAT Cardio: Reg rate Chest: normal effort, normal rate of breathing Abd: soft, non-distended Ext: no edema GI: Non-distended.  BS +. +PEG. Skin: Warm and dry.  Intact. PEG site CDI. Psych: Flat. Normal behavior. Musc: No edema in extremities.  No tenderness in extremities. Neuro: Alert Dysarthria, unchanged Motor: RUE/RLE: 4+/5 proximal distal LUE: 4/5 proximal to distal, unchanged LLE: 4/5 hip flexion, knee extension, 1/5 ankle dorsiflexion  Assessment/Plan: 1. Functional deficits which require 3+ hours per day of interdisciplinary therapy in a comprehensive inpatient rehab setting.  Physiatrist is providing close team supervision and 24 hour management of active medical problems listed below.  Physiatrist and rehab team continue to assess barriers to discharge/monitor patient progress toward functional and medical goals  Care Tool:  Bathing    Body parts bathed by patient: Left arm, Chest, Abdomen, Front perineal area, Right upper leg, Left upper leg, Face, Buttocks, Right lower leg, Left lower leg, Right arm   Body parts bathed by  helper: Right arm, Left arm, Buttocks, Left upper leg, Right lower leg, Right upper leg, Left lower leg     Bathing assist Assist Level: Contact Guard/Touching assist     Upper Body Dressing/Undressing Upper body dressing   What is the patient wearing?: Pull over shirt    Upper body assist Assist Level: Minimal Assistance - Patient > 75%    Lower Body Dressing/Undressing Lower body dressing      What is the patient wearing?: Pants     Lower body assist Assist for lower body dressing: Contact Guard/Touching assist     Toileting Toileting    Toileting assist Assist for toileting: Contact Guard/Touching assist     Transfers Chair/bed transfer  Transfers assist     Chair/bed transfer assist level: Supervision/Verbal cueing Chair/bed transfer assistive device:   Ambulation assist      Assist level: Contact Guard/Touching assist Assistive device: No Device Max distance: 180 ft   Walk 10 feet activity   Assist     Assist level: Contact Guard/Touching assist Assistive device: No Device   Walk 50 feet activity   Assist Walk 50 feet with 2 turns activity did not occur: Safety/medical concerns (fatigue, weakness, decreased balance/postural control)  Assist level: Contact Guard/Touching assist Assistive device: No Device    Walk 150 feet activity   Assist Walk 150 feet activity did not occur: Safety/medical concerns (fatigue, weakness, decreased balance/postural control)  Assist level: Contact Guard/Touching assist Assistive device: No Device    Walk 10 feet on uneven surface  activity   Assist Walk  10 feet on uneven surfaces activity did not occur: Safety/medical concerns (fatigue, weakness, decreased balance/postural control, poor safety awareness)         Wheelchair     Assist Will patient use wheelchair at discharge?: Yes Type of Wheelchair: Manual Wheelchair activity did not occur:  Refused  Wheelchair assist level: Dependent - Patient 0%      Wheelchair 50 feet with 2 turns activity    Assist        Assist Level: Dependent - Patient 0%   Wheelchair 150 feet activity     Assist      Assist Level: Dependent - Patient 0%   Blood pressure 129/80, pulse 95, temperature 99.4 F (37.4 C), temperature source Oral, resp. rate 18, height 5\' 4"  (1.626 m), weight 65.9 kg, SpO2 97 %.  Medical Problem List and Plan: 1.  Decreased functional mobility secondary to acute hypoxic hypercapnic respiratory failure with aspiration/ventilatory support pneumonia  Continue CIR 2.  Antithrombotics: -DVT/anticoagulation: Subcutaneous heparin             -antiplatelet therapy: N/A 3. Pain Management: Oxycodone as needed. Pain is well controlled. Not using oxycodone- will d/c 4. Mood: no SSRIs             -antipsychotic agents: Seroquel 400 mg twice daily 5. Neuropsych: This patient is not capable of making decisions on his own behalf. 6. Skin/Wound Care: Routine skin checks 7. Fluids/Electrolytes/Nutrition: Routine I/Os 8.  Paraesophageal hernia/massively dilated esophagus.  Status post hernia repair percutaneous gastrostomy tube placement 10/26/2020 per cardiothoracic surgery Dr. 13/02/2020 changed to placement of jejunal arm via indwelling gastrostomy tube 11/07/2020 per interventional radiology.    -Continue Protonix twice daily, MiraLAX twice daily, Colace twice daily.   9.  Iron deficiency anemia.    Hemoglobin 11.2 on 11/24  Continue to monitor 10.  Schizophrenia/intellectual delay/seizure disorder.  Valproic acid 250 mg twice daily, Lamictal 100 mg daily, seroquel 400mg  bid  No breakthrough seizures since admission to CIR-11/28 11.  Hypothyroidism.  Synthroid 12.  Type 2 diabetes mellitus with hyperglycemia, confounded by tube feeds.  Hemoglobin A1c 5.8.  SSI.  Continue SSI  Elevated on 11/30 to 171- improving, continue to monitor.  13.  Hyperlipidemia.   Crestor 14.  History of CVA with left-sided residual weakness continue therapies as directed 15.  Dysphagia  NPO at present  Continue tube feeds via PEG  See #8  CT angio chest personally reviewed-esophagus looks relatively stable, esophagram unremarkable for leaks, plan to proceed with MBS early this week-discussed with therapies previously 16.  Hyponatremia  Sodium 131 on 11/26, up to 132 on 11/29.   LOS: 7 days A FACE TO FACE EVALUATION WAS PERFORMED  12/26 [name] 11/22/2020, 9:56 AM

## 2020-11-22 NOTE — Progress Notes (Signed)
Occupational Therapy Session Note  Patient Details  Name: [name] MRN: [id number] Date of Birth: 12-16-1956  Today's Date: 11/22/2020 OT Individual Time: 1305-1400 OT Individual Time Calculation (min): 55 min    Short Term Goals: Week 1:  OT Short Term Goal 1 (Week 1): Pt will wait to initiate transfer till after w/c brakes are locked to demo improved safety awareness wiht no VC OT Short Term Goal 2 (Week 1): Pt will transfer to toielt wiht CGA OT Short Term Goal 3 (Week 1): Pt will don LB clothing wiht A only for balance OT Short Term Goal 4 (Week 1): Pt will complete 2/3 steps of toileting OT Short Term Goal 5 (Week 1): Pt will bathe with MIN A and no more than min VC for sequencing/thoroughness  Skilled Therapeutic Interventions/Progress Updates:    Pt resting in w/c with RN and NT present.  Pt had not eaten lunch. OT intervention with focus on self feeding, safety awareness with swallowing precautions/strategies, functional amb with RW, toileting, and activity tolerance. Pt required supervision for self feeding with mod verbal cues for small bites and  clearing throat. Pt requested to use bathroom. Amb with RW-CGA with occasional min A for steering RW. Pt attempted to void using urinal but receptive to suggestion to sit on toilet. Pt required min A for toileting tasks. Pt returned to room and stood at sink to wash hands.  Pt slightly impulsive with RW and frequently pushed it aside. Pt returned to w/c and remained seated with belt alarm activated. All needs within reach.   Therapy Documentation Precautions:  Precautions Precautions: Fall Precaution Comments: L hemiparesis, PEG Restrictions Weight Bearing Restrictions: No Pain:  Pt denies pain this afternoon   Therapy/Group: Individual Therapy  Rich Brave 11/22/2020, 2:23 PM

## 2020-11-22 NOTE — Progress Notes (Signed)
Physical Therapy Session Note  Patient Details  Name: [name] MRN: [id number] Date of Birth: July 28, 1956  Today's Date: 11/22/2020 PT Individual Time: 1030-1130 PT Individual Time Calculation (min): 60 min   Short Term Goals: Week 1:  PT Short Term Goal 1 (Week 1): pt will transfer bed<>chair with LRAD and CGA PT Short Term Goal 2 (Week 1): Pt will ambulate 29ft with LRAD and CGA PT Short Term Goal 3 (Week 1): Pt will perform simulated car transfer with LRAD and min A  Skilled Therapeutic Interventions/Progress Updates:     Patient in w/c with RN in the room upon PT arrival. Patient alert and agreeable to PT session. Patient denied pain during session.  Focused session on gait training/assessment with and without RW with L hand splint to determine appropriate DME needs for d/c and possible AFO needs due to lack of L DF activation with mobility.   Therapeutic Activity: Transfers: Patient performed sit to/from stand x5 with close supervision for safety/balance and an ambulatory toilet transfer with CGA-min A with and without RW. Provided verbal cues for controlled mobility for safety due to impulsivity, provided assist for ambulation for AD management due to patient bumping into RW with poor L hand control and minor LOB with L toe catching without AD. Patient was continent of bowl and bladder during toileting, performed peri-care mod I. Patient donned pants, socks, and shoes with total A for time/energy conservation during toileting.   Gait Training:  Trial 1: ambulated >150 feet using RW with L hand splint with CGA-min A. Noted patient with poor control of RW with L hand and patient kicking RW with L foot during gait Trial 2: ambulated 80 feet without AD. Noted increased trunk movement with gait due to weakness and compensatory strategies and increased L toe drag and intermittent L extensor thrust at the knee in stance. Trial 3: ambulated 80 feet with L DF ACE wrap without AD.  Noted improved L foot clearance, decreased lateral trunk lean during L swing, and reduced L extensor thrust. Trial 4: ambulated 80 feet with L PLS AFO without AD. Noted improved L foot clearance, decreased lateral trunk lean during L swing, and no L extensor thrust.  Trial 5: ambulated >150 feet with L DF ACE wrap without AD. Noted improved L foot clearance, decreased lateral trunk lean during L swing, and no L extensor thrust with cues for eccentric hamstring activation in stance.  Overall, patient ambulated with decreased L step height/foot clearance, decreased L DF with intermittent toe drag, decreased hamstring activation in pre-swing, circumduction gait on L, R lateral lean during L swing to compensate for proximal trunk and lower extremity weakness. Provided verbal cues for hamstring activation during pre-swing, reduced trunk motion, increased hip flexion during swing, and increased L weight shift for improved reciprocal pattern.  Patient with improved gait mechanics and safety with L PLS AFO, however, unsure of potential for strength return to dorsiflexors and hamstrings and want to promote recovery. Will continue with DF ACE wrap and monitor strength changes and gait during patient's stay.   Patient in w/c in the room, agreeable to sit up to eat lunch with upgraded diet, at end of session with breaks locked, seat belt alarm set, and all needs within reach.    Therapy Documentation Precautions:  Precautions Precautions: Fall Precaution Comments: L hemiparesis, PEG Restrictions Weight Bearing Restrictions: No   Therapy/Group: Individual Therapy  Dagoberto Nealy L Denario Bagot PT, DPT  11/22/2020, 6:45 PM

## 2020-11-22 NOTE — Progress Notes (Signed)
Patient ID: [name], male   DOB: May 28, 1956, 64 y.o.   MRN: [id number]  11/21/2020-SW received message from pt sister [name] reporting that pt dentures went missing 10/28 or 10/29 and wanted to know next steps.    11/22/2020- SW spoke with pt sister [name] to provide updates from team conference, d/c date 12/7 and change in pt diet to D2 thin liquids with small sips. SW informed there has been no follow-up from [facility]. Reports she will make efforts to coordinate a time to speak and will contact SW. Reports she will be back in town tomorrow and will come by and visit patient.   Cecile Sheerer, MSW, LCSWA Office: [phone] Cell: [phone] Fax: [phone]

## 2020-11-22 NOTE — Progress Notes (Signed)
Physical Therapy Session Note  Patient Details  Name: [name] MRN: [id number] Date of Birth: 1956/12/19  Today's Date: 11/22/2020 PT Missed Time: 60 Minutes Missed Time Reason: Patient unwilling to participate;Patient fatigue  Pt received supine in bed with the lights off, but awake. Upon encouraging pt to participate in therapy session he states "no" and then goes on to say that he doesn't want to get out of bed. Therapist continues to encourage patient and educate on importance of therapy; however, pt politely but adamantly declines participation at this time and avoids eye contact with therapist. Pt left supine in bed with needs in reach and bed alarm on. Missed 60 minutes of skilled physical therapy.  Ginny Forth , PT, DPT, CSRS  11/22/2020, 1:03 PM

## 2020-11-22 NOTE — Patient Care Conference (Signed)
Inpatient RehabilitationTeam Conference and Plan of Care Update Date: 11/22/2020   Time: 10:03 AM    Patient Name: [name]      Medical Record Number: [id number]  Date of Birth: 02/03/1956 Sex: Male         Room/Bed: 4W11C/4W11C-01 Payor Info: Payor: MEDICARE / Plan: MEDICARE PART A AND B / Product Type: *No Product type* /    Admit Date/Time:  11/15/2020  3:05 PM  Primary Diagnosis:  Debility  Hospital Problems: Principal Problem:   Debility Active Problems:   Hyponatremia   Dysphagia   Seizures (HCC)   S/P percutaneous endoscopic gastrostomy (PEG) tube placement [facility])   Dilation of esophagus    Expected Discharge Date: Expected Discharge Date: 11/29/20  Team Members Present: Physician leading conference: Dr. Sula Soda Care Coodinator Present: Cecile Sheerer, LCSWA;Truth Barot Marlyne Beards, RN, BSN, CRRN Nurse Present: Other (comment) Lupita Dawn, RN) PT Present: Serina Cowper, PT OT Present: Jake Shark, OT SLP Present: Feliberto Gottron, SLP PPS Coordinator present : Edson Snowball, Park Breed, SLP     Current Status/Progress Goal Weekly Team Focus  Bowel/Bladder   Continent of bowel and bladder with some urgency  Remain continent of bowel and bladder  Assess toileting needs Q4 hours and PRN   Swallow/Nutrition/ Hydration   MBS today-Dys. 2 textures with thin liquids-Full supervision  Supervision  Tolerance of current diet, use of strategies   ADL's   min A ADL transfers, CGA toileting, min A UB dressing, CGA LB dressing  Supervision overall  ADL retraining, ADL transfers, BUE coordination, dynamic balance   Mobility   Supervision bed mobility and transfers, CGA-min A gait without AD >150 ft, min A 4 steps R rail  Supervision overall  Activity tolerance, functional mobility, gait training, balance, L upper/lower extremity NMR, patient/caregiver education.   Communication             Safety/Cognition/ Behavioral Observations             Pain   no pain  Remain Pain free  Assess pain Q4H and PRN   Skin   skin intact with peg tube in left upper quadrant and several small percutaneous incisions r/t peg placemtn surgery closed with skin glue  no new breakdown  Assess skin Qshift and PRN     Discharge Planning:  Pt currently lives in [facility] group home independent living with 4 other residents and they have 24/7 supervision. Pt sister [name] is his POA.   Team Discussion: Continent B/B with some urinary urgency. No complaints of pain. OT reports patient is a min assist for transfers, min assist for upper body ADL's, contact guard for lower body ADL's. PT reports patient's balance is improving, he is a supervision for bed mobility and transfers, contact guard to min assist for gait without an assistive device >150'. Patient has supervision goals. SLP reports patient had a MBS this morning and will start a DYS 2 with thin liquids and will be a full supervision. Patient on target to meet rehab goals: yes  *See Care Plan and progress notes for long and short-term goals.   Revisions to Treatment Plan:  None at this time.  Teaching Needs: Continue with family education.  Current Barriers to Discharge: Decreased caregiver support, Home enviroment access/layout, Wound care, Lack of/limited family support and Nutritional means  Possible Resolutions to Barriers: Offer nutritional supplementation, educate wound care and dressing changes, provide emotional support to patient and family.     Medical Summary Current Status: Denies pain-  discontinued oxycodone, dentures missing, hyponatremia, anemia, vitals stable, seizure free, CBGs slightly elevated, PEG  Barriers to Discharge: Medical stability;Decreased family/caregiver support;Nutrition means;Wound care  Barriers to Discharge Comments: +PEG, CBGs elevated, hyponatermia, anemia Possible Resolutions to [employer] Focus: daily wound care, monitor CBGs AC/HS, monitor Na and Hgb  weekly, continue Protonix   Continued Need for Acute Rehabilitation Level of Care: The patient requires daily medical management by a physician with specialized training in physical medicine and rehabilitation for the following reasons: Direction of a multidisciplinary physical rehabilitation program to maximize functional independence : Yes Medical management of patient stability for increased activity during participation in an intensive rehabilitation regime.: Yes Analysis of laboratory values and/or radiology reports with any subsequent need for medication adjustment and/or medical intervention. : Yes   I attest that I was present, lead the team conference, and concur with the assessment and plan of the team.   Tennis Must 11/22/2020, 12:17 PM

## 2020-11-22 NOTE — Progress Notes (Addendum)
Modified Barium Swallow Progress Note  Patient Details  Name: [name] MRN: [id number] Date of Birth: [birth year]  Today's Date: 11/22/2020  Modified Barium Swallow completed.  Full report located under Chart Review in the Imaging Section.  Brief recommendations include the following:  Clinical Impression     Pt presents with mild oropharyngeal dysphagia. Reduced oral control leading to premature spillage of thin liquids in large quantities, resulted in only x1 occurrence of trace (PAS 7) sensed aspiration during the swallow and X1 occurrence of trace penetrant (PAS 5.) All other thin liquid trials (via cup, straw and with mixed consistencies) were swallowed normally without penetration nor aspiration. Oral phrase was characterized by reduced bolus cohesion and likely reduced lingual manipulation resulting in only mild lingual residue with dys 2 textures. Prolonged mastication was noted along with the above listed oral deficits on dys 3 textures. Swallow was initiated in the valleculae for solids and pyriform sinuses for thin and nectar thick liquids. SLP recommends diet of dys 2 textures and thin liquids with full supervision for small sips/bites. Medication can be consumed whole in puree. SLP can advance solids at bedside.  Swallow Evaluation Recommendations       SLP Diet Recommendations: Dysphagia 2 (Fine chop) solids;Thin liquid   Liquid Administration via: Straw;Cup   Medication Administration: Whole meds with puree   Supervision: Patient able to self feed;Full supervision/cueing for compensatory strategies   Compensations: Small sips/bites;Slow rate;Minimize environmental distractions   Postural Changes: Seated upright at 90 degrees   Oral Care Recommendations: Oral care BID        Quamesha Mullet 11/22/2020,4:24 PM

## 2020-11-23 ENCOUNTER — Inpatient Hospital Stay (HOSPITAL_COMMUNITY): Payer: Medicare Other

## 2020-11-23 ENCOUNTER — Inpatient Hospital Stay (HOSPITAL_COMMUNITY): Payer: Medicare Other | Admitting: Speech Pathology

## 2020-11-23 DIAGNOSIS — R5381 Other malaise: Secondary | ICD-10-CM | POA: Diagnosis not present

## 2020-11-23 LAB — GLUCOSE, CAPILLARY
Glucose-Capillary: 111 mg/dL — ABNORMAL HIGH (ref 70–99)
Glucose-Capillary: 134 mg/dL — ABNORMAL HIGH (ref 70–99)
Glucose-Capillary: 145 mg/dL — ABNORMAL HIGH (ref 70–99)
Glucose-Capillary: 159 mg/dL — ABNORMAL HIGH (ref 70–99)
Glucose-Capillary: 164 mg/dL — ABNORMAL HIGH (ref 70–99)
Glucose-Capillary: 192 mg/dL — ABNORMAL HIGH (ref 70–99)

## 2020-11-23 MED ORDER — FREE WATER
50.0000 mL | Status: DC
  Administered 2020-11-23 – 2020-11-25 (×12): 50 mL

## 2020-11-23 MED ORDER — OSMOLITE 1.2 CAL PO LIQD
1000.0000 mL | ORAL | Status: DC
  Administered 2020-11-23: 1000 mL

## 2020-11-23 MED ORDER — PANTOPRAZOLE SODIUM 40 MG PO PACK
40.0000 mg | PACK | Freq: Two times a day (BID) | ORAL | Status: DC
  Administered 2020-11-23 – 2020-11-25 (×4): 40 mg
  Filled 2020-11-23 (×3): qty 20

## 2020-11-23 MED ORDER — ENSURE ENLIVE PO LIQD
237.0000 mL | Freq: Two times a day (BID) | ORAL | Status: DC
  Administered 2020-11-23 – 2020-12-02 (×9): 237 mL via ORAL
  Filled 2020-11-23 (×2): qty 237

## 2020-11-23 NOTE — Progress Notes (Signed)
[place] PHYSICAL MEDICINE & REHABILITATION PROGRESS NOTE   Subjective/Complaints: No complaints this morning. Denies pain, constipation, insomnia.  [name] is currently on max dose/frequency of pantoprazole  ROS: Denies CP, SOB, N/V/D  Objective:   No results found. No results for input(s): WBC, HGB, HCT, PLT in the last 72 hours. Recent Labs    11/21/20 0754  NA 132*  K 4.3  CL 94*  CO2 26  GLUCOSE 155*  BUN 14  CREATININE 1.00  CALCIUM 9.2    Intake/Output Summary (Last 24 hours) at 11/23/2020 1134 Last data filed at 11/23/2020 0700 Gross per 24 hour  Intake 320 ml  Output 800 ml  Net -480 ml   Physical Exam: Vital Signs Blood pressure 106/67, pulse 90, temperature 98.2 F (36.8 C), temperature source Oral, resp. rate 14, height 5\' 4"  (1.626 m), weight 68.1 kg, SpO2 99 %.  Gen: no distress, normal appearing HEENT: oral mucosa pink and moist, NCAT Cardio: Reg rate Chest: normal effort, normal rate of breathing GI: Non-distended.  BS +. +PEG. Skin: Warm and dry.  Intact. PEG site CDI. Psych: Flat. Normal behavior. Musc: No edema in extremities.  No tenderness in extremities. Neuro: Alert Dysarthria, unchanged Motor: RUE/RLE: 4+/5 proximal distal LUE: 4/5 proximal to distal, unchanged LLE: 4/5 hip flexion, knee extension, 1/5 ankle dorsiflexion  Assessment/Plan: 1. Functional deficits which require 3+ hours per day of interdisciplinary therapy in a comprehensive inpatient rehab setting.  Physiatrist is providing close team supervision and 24 hour management of active medical problems listed below.  Physiatrist and rehab team continue to assess barriers to discharge/monitor patient progress toward functional and medical goals  Care Tool:  Bathing    Body parts bathed by patient: Left arm, Chest, Abdomen, Front perineal area, Right upper leg, Left upper leg, Face, Buttocks, Right lower leg, Left  lower leg, Right arm   Body parts bathed by helper: Right arm, Left arm, Buttocks, Left upper leg, Right lower leg, Right upper leg, Left lower leg     Bathing assist Assist Level: Contact Guard/Touching assist     Upper Body Dressing/Undressing Upper body dressing   What is the patient wearing?: Pull over shirt    Upper body assist Assist Level: Minimal Assistance - Patient > 75%    Lower Body Dressing/Undressing Lower body dressing      What is the patient wearing?: Pants     Lower body assist Assist for lower body dressing: Contact Guard/Touching assist     Toileting Toileting    Toileting assist Assist for toileting: Minimal Assistance - Patient > 75%     Transfers Chair/bed transfer  Transfers assist     Chair/bed transfer assist level: Supervision/Verbal cueing Chair/bed transfer assistive device:   Ambulation assist      Assist level: Contact Guard/Touching assist Assistive device: No Device Max distance: 180 ft   Walk 10 feet activity   Assist     Assist level: Contact Guard/Touching assist Assistive device: No Device   Walk 50 feet activity   Assist Walk 50 feet with 2 turns activity did not occur: Safety/medical concerns (fatigue, weakness, decreased balance/postural control)  Assist level: Contact Guard/Touching assist Assistive device: No Device    Walk 150 feet activity   Assist Walk 150 feet activity did not occur: Safety/medical concerns (fatigue, weakness, decreased balance/postural control)  Assist level: Contact Guard/Touching assist Assistive device: No Device    Walk  10 feet on uneven surface  activity   Assist Walk 10 feet on uneven surfaces activity did not occur: Safety/medical concerns (fatigue, weakness, decreased balance/postural control, poor safety awareness)         Wheelchair     Assist Will patient use wheelchair at discharge?: Yes Type of Wheelchair:  Manual Wheelchair activity did not occur: Refused  Wheelchair assist level: Dependent - Patient 0%      Wheelchair 50 feet with 2 turns activity    Assist        Assist Level: Dependent - Patient 0%   Wheelchair 150 feet activity     Assist      Assist Level: Dependent - Patient 0%   Blood pressure 106/67, pulse 90, temperature 98.2 F (36.8 C), temperature source Oral, resp. rate 14, height 5\' 4"  (1.626 m), weight 68.1 kg, SpO2 99 %.  Medical Problem List and Plan: 1.  Decreased functional mobility secondary to acute hypoxic hypercapnic respiratory failure with aspiration/ventilatory support pneumonia  Continue CIR 2.  Antithrombotics: -DVT/anticoagulation: Subcutaneous heparin             -antiplatelet therapy: N/A 3. Pain Management: Denies pain. Not using oxycodone- will d/c 4. Mood: no SSRIs             -antipsychotic agents: Seroquel 400 mg twice daily 5. Neuropsych: This patient is not capable of making decisions on his own behalf. 6. Skin/Wound Care: Routine skin checks 7. Fluids/Electrolytes/Nutrition: Routine I/Os 8.  Paraesophageal hernia/massively dilated esophagus.  Status post hernia repair percutaneous gastrostomy tube placement 10/26/2020 per cardiothoracic surgery Dr. 13/02/2020 changed to placement of jejunal arm via indwelling gastrostomy tube 11/07/2020 per interventional radiology.    -Continue Protonix twice daily, MiraLAX twice daily, Colace twice daily.   9.  Iron deficiency anemia.    Hemoglobin 11.2 on 11/24  Continue to monitor 10.  Schizophrenia/intellectual delay/seizure disorder.  Valproic acid 250 mg twice daily, Lamictal 100 mg daily, seroquel 400mg  bid  No breakthrough seizures since admission to [facility]-11/28 11.  Hypothyroidism.  Synthroid 12.  Type 2 diabetes mellitus with hyperglycemia, confounded by tube feeds.  Hemoglobin A1c 5.8.  SSI.  Continue SSI  Elevated on 12/1 to 192- will discuss with RD reducing tube feeds since [name] is  eating better.  13.  Hyperlipidemia.  Crestor 14.  History of CVA with left-sided residual weakness continue therapies as directed 15.  Dysphagia  Continue tube feeds via PEG  Tolerating meals better- ate 100% lunch 11/30  See #8  CT angio chest personally reviewed-esophagus looks relatively stable, esophagram unremarkable for leaks, plan to proceed with MBS early this week-discussed with therapies previously 16.  Hyponatremia  Sodium 131 on 11/26, up to 132 on 11/29.   LOS: 8 days A FACE TO FACE EVALUATION WAS PERFORMED  12/26 Sonnie Pawloski 11/23/2020, 11:34 AM

## 2020-11-23 NOTE — Progress Notes (Signed)
Physical Therapy Weekly Progress Note  Patient Details  Name: [name] MRN: [id number] Date of Birth: July 20, 1956  Beginning of progress report period: November 16, 2020 End of progress report period: November 23, 2020  Today's Date: 11/23/2020 PT Individual Time: 1300-1405 PT Individual Time Calculation (min): 65 min   Patient has met 3 of 3 short term goals.  Patient make great progress this week, can be limited by decreased activity tolerance and some self-limiting behaviors that usually can be redirected. He currently performs bed mobility with supervision-mod I for increased time, supervision for transfers for safety due to increased impulsivity, CGA with intermittent min A for gait without AD with DF ACE wrap >150 ft, and min A 4-6" steps using B rails.   Patient continues to demonstrate the following deficits muscle weakness, decreased cardiorespiratoy endurance, abnormal tone and decreased coordination and decreased standing balance, decreased postural control, hemiplegia and decreased balance strategies and therefore will continue to benefit from skilled PT intervention to increase functional independence with mobility.  Patient progressing toward long term goals..  Continue plan of care.  PT Short Term Goals Week 1:  PT Short Term Goal 1 (Week 1): pt will transfer bed<>chair with LRAD and CGA PT Short Term Goal 1 - Progress (Week 1): Met PT Short Term Goal 2 (Week 1): Pt will ambulate 41f with LRAD and CGA PT Short Term Goal 2 - Progress (Week 1): Met PT Short Term Goal 3 (Week 1): Pt will perform simulated car transfer with LRAD and min A PT Short Term Goal 3 - Progress (Week 1): Met Week 2:  PT Short Term Goal 1 (Week 2): Patient will perform basic transfers with supervision consistently. PT Short Term Goal 2 (Week 2): Patient will ambulate >150 ft with close supervision for safety using LRAD. PT Short Term Goal 3 (Week 2): Patient will perform dynamic balance  activities in standing >10 min.  Skilled Therapeutic Interventions/Progress Updates:     Patient in w/c in the room upon PT arrival. Patient alert and agreeable to PT session. Patient denied pain during session, requesting to void at beginning of session.   Therapeutic Activity: Transfers: Patient performed sit to/from stand x9 with CGA-close supervision for safety due to impulsivity. Provided verbal cues for forward weight shift and controlled movement for improved balance. Patient performed an ambulatory transfer to the bathroom and stood to void over the toilet with CGA, then performed hand hygiene with supervision at the sink. Patient performed a simulated sedan height car transfer with CGA using car frame to perform step-in technqieu. Provided cues for safe technique, educated on increased safety with sitting then bringing his legs into the car for reduced fall risk.  Gait Training:  Patient ambulated >250 feet with L DF ACE wrap without AD with min A-CGA. Ambulated with increased gait speed, increased trunk sway, decreased L hip/knee flexion in swing, and intermittent L extensor thrust in stance. Provided verbal cues for decreased gait speed, increased L foot clearance, increased eccentric hamstring activation in stance on L for improved knee control, and increased core activation for improved trunk control. Placed 1/4" heel wedge in patient's L shoe with DF wrap on second trial with decreased L extensor thrust.   Neuromuscular Re-ed: Patient performed the following motor control activities: -quadruped progressing to reciprocal crawling for increased L upper extremity weight bearing and L side motor and trunk control, required mod A for L hand placement  -tall kneeling x30 sec progressing to sitting on heels then returning  to tall kneeling x10 for increased trunk and proximal lower extremity motor control -lateral leans to L elbow focused on weight bearing and pushing through his L hand to  return to sitting x10 Patient performed the Berg Balance Test: Patient demonstrates increased fall risk as noted by score of  27/56 on Berg Balance Scale.  (<36= high risk for falls, close to 100%; 37-45 significant >80%; 46-51 moderate >50%; 52-55 lower >25%) Educated patient on results and interpretation and implication of fall risk score with functional mobility. Utilized teach-back method to clarify and ensure patient understanding.   Patient reporting increased fatigue and turned on the TV stating "I will watch this now" at end of session. Patient declined offers for seated exercises/stretching or further mobility. Patient missed 10 min of skilled PT due to fatigue, RN made aware. Will attempt to make-up missed time as able.    Patient in w/c in the room at end of session with breaks locked, seat belt alarm set, and all needs within reach.    Therapy Documentation Precautions:  Precautions Precautions: Fall Precaution Comments: L hemiparesis, PEG Restrictions Weight Bearing Restrictions: No Balance: Standardized Balance Assessment Standardized Balance Assessment: Berg Balance Test Berg Balance Test Sit to Stand: Needs minimal aid to stand or to stabilize Standing Unsupported: Able to stand 2 minutes with supervision Sitting with Back Unsupported but Feet Supported on Floor or Stool: Able to sit safely and securely 2 minutes Stand to Sit: Controls descent by using hands Transfers: Able to transfer with verbal cueing and /or supervision Standing Unsupported with Eyes Closed: Able to stand 10 seconds with supervision Standing Ubsupported with Feet Together: Able to place feet together independently but unable to hold for 30 seconds From Standing, Reach Forward with Outstretched Arm: Reaches forward but needs supervision From Standing Position, Pick up Object from Floor: Able to pick up shoe, needs supervision From Standing Position, Turn to Look Behind Over each Shoulder: Turn  sideways only but maintains balance Turn 360 Degrees: Needs assistance while turning Standing Unsupported, Alternately Place Feet on Step/Stool: Able to complete >2 steps/needs minimal assist Standing Unsupported, One Foot in Front: Able to take small step independently and hold 30 seconds Standing on One Leg: Unable to try or needs assist to prevent fall Total Score: 27/56  Therapy/Group: Individual Therapy  Torrey Horseman L Kylie Simmonds PT, DPT  11/23/2020, 8:22 PM

## 2020-11-23 NOTE — Progress Notes (Signed)
Patient ID: [name], male   DOB: April 09, 1956, 64 y.o.   MRN: [id number]  SW received phone call from [facility] with [facility] [phone]) to discuss scheduling a conference call to discuss patient care needs at d/c. SW informed on pt d/c date being 12/7. SW inquired about if pt is able to have HH come into the home, who can come in for family education, and if pt will continue to have a bed. Reports they do not allow home health services into the home but able to transport pt for outpatient therapies. If pt requires [facility] care needs, will need to go to SNF. Confirms pt will have a bed to return too at [facility]. She intends to speak with administrator and follow-up with SW about possible times for conference call tomorrow, and if the house manager is able to come in for family education. SW called pt sister [name] to inform on above, and SW will follow-up with conference call time.   SW left message for Risk Management [phone]) to discuss concerns related to lost dentures and process on how family should proceed. SW waiting on follow-up.  *SW received updates from Tabitha/RN with [facility] that pt will now not be able to be admitted to [facility] until around 12/14 in which patient's LOC is signed, and pt gets authorization to be here per Administrator. Also reports administrator is not available for a conference tomorrow due to scheduled meetings. SW informed will follow-up once there is more information from medical team on if we can support pt staying here until the LOC is authorized for his placement. SW updated medical team.   Cecile Sheerer, MSW, LCSWA Office: [phone] Cell: [phone] Fax: [phone]

## 2020-11-23 NOTE — Progress Notes (Addendum)
Nutrition Follow-up  RD working remotely.  DOCUMENTATION CODES:   Not applicable  INTERVENTION:   - 48-hour calorie count to begin today at lunch meal, RD to provide day 1 results tomorrow, 11/24/20  Transition to nocturnal tube feeds via J-port: - Osmolite 1.2 @ 80 ml/hr to run for 12 hours from 1800 to 0600 (total of 960 ml) - Free water flushes of 50 ml q 4 hours  Nocturnal tube feeding regimen provides 1152 kcal, 53 grams of protein, and 787 ml of H2O (meets 61% of kcal needs and 56% of protein needs).  Total free water with flushes: 1087 ml  - Ensure Enlive po BID, each supplement provides 350 kcal and 20 grams of protein  NUTRITION DIAGNOSIS:   Inadequate oral intake related to dysphagia as evidenced by NPO status.  Progressing, pt now on dysphagia 2 diet with thin liquids  GOAL:   Patient will meet greater than or equal to 90% of their needs  Progressing  MONITOR:   Diet advancement, TF tolerance, Labs, Weight trends  REASON FOR ASSESSMENT:   Consult Enteral/tube feeding initiation and management  ASSESSMENT:   65 year old male with PMH of iron deficiency anemia, T2DM, CVA, dysphagia, Barrett's esophagus, GERD, gastroparesis, HTN, HLD, intellectual disability residing at a group home, schizophrenia, seizure disorder, and hypothyroidism. Presented 10/20/20 after a fall. CT abdomen pelvis showed a large stable gastric hernia with associated esophageal dilatation and subsequent mass-effect on the trachea. Pt underwent EGD on 10/29 that showed dilation in the entire esophagus. On 10/30, pt developed significant respiratory distress and required intubation. Pt underwent hiatal hernia repair with G-tube placement by cardiothoracic surgery on 11/03. A jejunal arm was added to indwelling G-tube on 11/15 by IR. Pt was extubated 11/13. Pt remains NPO. Admitted to CIR on 11/23.  11/30 - MBS, diet advanced to dysphagia 2 with thin liquids  Discussed pt with RN. Plan is to  start calorie count today at lunch meal now that diet has been advanced. Discussed pt with PA. Will transition to nocturnal TF to promote PO intake during the day. If PO intake found to be meeting >/= 75% of estimated needs after completion of calorie count, recommend d/c TF altogether.  CIR admit weight: 69.6 kg Current weight: 68.1 kg  Pt's weight is fairly stable at this time. Will continue to monitor.  Current TF: Osmolite 1.2 @ 85 ml/hr, free water flushes of 75 ml q 4 hours  Meal Completion: 85-100%  Medications reviewed and include: colace, SSI q 4 hours, lamictal, protonix, miralax  Labs reviewed: sodium 132 on 11/29 CBG's: 111-192 x 24 hours  NUTRITION - FOCUSED PHYSICAL EXAM:  Unable to complete at this time. RD working remotely.  Diet Order:   Diet Order            DIET DYS 2 Room service appropriate? Yes; Fluid consistency: Thin  Diet effective now                 EDUCATION NEEDS:   No education needs have been identified at this time  Skin:  Skin Assessment: Skin Integrity Issues: Incisions: abdomen  Last BM:  11/22/20 type 7  Height:   Ht Readings from Last 1 Encounters:  11/19/20 5\' 4"  (1.626 m)    Weight:   Wt Readings from Last 1 Encounters:  11/23/20 68.1 kg    BMI:  Body mass index is 25.77 kg/m.  Estimated Nutritional Needs:   Kcal:  1900-2100  Protein:  95-110 grams  Fluid:  >/=  1.9 L    Gustavus [name], MS, RD, LDN Inpatient Clinical Dietitian Please see [facility] for contact information.

## 2020-11-23 NOTE — Progress Notes (Signed)
Occupational Therapy Weekly Progress Note  Patient Details  Name: [name] MRN: [id number] Date of Birth: 07-29-1956  Beginning of progress report period: November 16, 2020 End of progress report period: November 23, 2020  Today's Date: 11/23/2020 OT Individual Time: 3568-6168 OT Individual Time Calculation (min): 57 min    Patient has met 4 of 5 short term goals.  Pt has made good progress this reporting period improving to overall CGA from MAX A for ADLs and MIN A for transfers. Pt continues to require support for ambulation and VC for safety awareness as pt can be impulsive with mobility. Pt would benefit from skilled OT to imprve safety and independene with BADL/transfers  Patient continues to demonstrate the following deficits: muscle weakness, decreased cardiorespiratoy endurance, impaired timing and sequencing, abnormal tone, unbalanced muscle activation, ataxia, decreased coordination and decreased motor planning, decreased attention to left, decreased attention, decreased awareness, decreased problem solving, decreased safety awareness and decreased memory and decreased sitting balance, decreased standing balance, decreased postural control, hemiplegia and decreased balance strategies and therefore will continue to benefit from skilled OT intervention to enhance overall performance with BADL.  Patient progressing toward long term goals..  Continue plan of care.  OT Short Term Goals Week 1:  OT Short Term Goal 1 (Week 1): Pt will wait to initiate transfer till after w/c brakes are locked to demo improved safety awareness wiht no VC OT Short Term Goal 1 - Progress (Week 1): Progressing toward goal OT Short Term Goal 2 (Week 1): Pt will transfer to toielt wiht CGA OT Short Term Goal 2 - Progress (Week 1): Met OT Short Term Goal 3 (Week 1): Pt will don LB clothing wiht A only for balance OT Short Term Goal 3 - Progress (Week 1): Met OT Short Term Goal 4 (Week 1): Pt will  complete 2/3 steps of toileting OT Short Term Goal 4 - Progress (Week 1): Met OT Short Term Goal 5 (Week 1): Pt will bathe with MIN A and no more than min VC for sequencing/thoroughness OT Short Term Goal 5 - Progress (Week 1): Met Week 2:  OT Short Term Goal 1 (Week 2): STG=LTG d/t ELOS  Skilled Therapeutic Interventions/Progress Updates:    1:1. Pt received in bed agreeable to OT with RN selivering medications-no pain reported. Pt completes all ambulation for transfer into bathroom and to tx gyms with CGA and 1 LOB forward with L foot drag. Pt completes toileting with CGA for balance only, bathing with S and VC for L attention when washing hair and dressing with CGA for LB and S for UB in standing with VC for L attention when pulling shirt down trunk. Pt able to don footwear with set up. Pt completes standing BITS trail making A test in 3 min 40 seconds with 4 errors and bell cancellation test with 8 misses in standing with disorganized scanning pattern (improvement from 13 misses on eval). Exited session with pt seated in w/c, exit alarm on and call light in reach   Therapy Documentation Precautions:  Precautions Precautions: Fall Precaution Comments: L hemiparesis, PEG Restrictions Weight Bearing Restrictions: No General:   Vital Signs: Therapy Vitals Temp: 98.2 F (36.8 C) Temp Source: Oral Pulse Rate: 90 Resp: 14 BP: 106/67 Patient Position (if appropriate): Lying Oxygen Therapy SpO2: 99 % O2 Device: Room Air Pain:   ADL: ADL Grooming: Minimal assistance Where Assessed-Grooming: Sitting at sink Upper Body Bathing: Minimal assistance Where Assessed-Upper Body Bathing: Sitting at sink Lower Body Bathing:  Moderate assistance Where Assessed-Lower Body Bathing: Sitting at sink, Standing at sink Upper Body Dressing: Moderate assistance Where Assessed-Upper Body Dressing: Sitting at sink Lower Body Dressing: Maximal assistance Where Assessed-Lower Body Dressing: Standing at  sink, Sitting at sink Toileting: Maximal assistance Where Assessed-Toileting: Glass blower/designer: Psychiatric nurse Method: Arts development officer: Systems analyst    Praxis   Exercises:   Other Treatments:     Therapy/Group: Individual Therapy  Tonny Branch 11/23/2020, 6:50 AM

## 2020-11-23 NOTE — Progress Notes (Signed)
Speech Language Pathology Weekly Progress and Session Note  Patient Details  Name: [name] MRN: [id number] Date of Birth: 09/27/1956  Beginning of progress report period: November 15, 2020 End of progress report period: November 23, 2020  Today's Date: 11/23/2020 SLP Individual Time: 1000-1045 SLP Individual Time Calculation (min): 45 min  Short Term Goals: Week 1: SLP Short Term Goal 1 (Week 1): Pending instrumental assessment, patient will tolerate trials of safest and least restrictive diet with no overt s/s aspiration and min A cues for use of compensatory strategies SLP Short Term Goal 1 - Progress (Week 1): Met SLP Short Term Goal 2 (Week 1): Pt will tolerate PO trials with SLP only with min A and no overt s/s aspiration SLP Short Term Goal 2 - Progress (Week 1): Met    New Short Term Goals: Week 2: SLP Short Term Goal 1 (Week 2): STGs=LTGs due to ELOS  Weekly Progress Updates: Patient has made functional gains and has met 2 of 2 STGs this reporting period. Patient had an MBS on 11/22/20 and initiated a diet of Dys. 2 textures with thin liquids. Patient is consuming his current diet with minimal overt s/s of aspiration and requires full supervision for utilization of swallowing compensatory strategies to maximize safety with PO intake. Patient and family education is ongoing. Patient would benefit from continued skilled SLP intervention to maximize safety with PO intake and assess tolerance. Continue with current plan of care.      Intensity: Minumum of 1-2 x/day, 30 to 90 minutes Frequency: Total of 15 hours over 7 days of combined therapies Duration/Length of Stay: 11/29/20 Treatment/Interventions: Cueing hierarchy;Therapeutic Activities;Therapeutic Exercise;Oral motor exercises;Dysphagia/aspiration precaution training;Patient/family education   Daily Session  Skilled Therapeutic Interventions: Skilled treatment session focused on dysphagia goals. SLP facilitated  session by providing skilled observation with a snack of Dys. 2 textures with thin liquids. Patient consumed meal without overt s/s of aspiration and was overall supervision level verbal cues for use of small bites/sips and a slow rate of self-feeding. Recommend patient continue current diet. Patient left upright in the wheelchair with alarm on and all needs within reach. Continue with current plan of care.       Pain No/Denies Pain   Therapy/Group: Individual Therapy  ,  11/23/2020, 6:52 AM         

## 2020-11-24 ENCOUNTER — Inpatient Hospital Stay (HOSPITAL_COMMUNITY): Payer: Medicare Other

## 2020-11-24 ENCOUNTER — Inpatient Hospital Stay (HOSPITAL_COMMUNITY): Payer: Medicare Other | Admitting: Speech Pathology

## 2020-11-24 DIAGNOSIS — E871 Hypo-osmolality and hyponatremia: Secondary | ICD-10-CM | POA: Diagnosis not present

## 2020-11-24 DIAGNOSIS — R5381 Other malaise: Secondary | ICD-10-CM | POA: Diagnosis not present

## 2020-11-24 DIAGNOSIS — R569 Unspecified convulsions: Secondary | ICD-10-CM | POA: Diagnosis not present

## 2020-11-24 DIAGNOSIS — R1319 Other dysphagia: Secondary | ICD-10-CM | POA: Diagnosis not present

## 2020-11-24 LAB — GLUCOSE, CAPILLARY
Glucose-Capillary: 120 mg/dL — ABNORMAL HIGH (ref 70–99)
Glucose-Capillary: 148 mg/dL — ABNORMAL HIGH (ref 70–99)
Glucose-Capillary: 160 mg/dL — ABNORMAL HIGH (ref 70–99)
Glucose-Capillary: 163 mg/dL — ABNORMAL HIGH (ref 70–99)
Glucose-Capillary: 175 mg/dL — ABNORMAL HIGH (ref 70–99)

## 2020-11-24 MED ORDER — OSMOLITE 1.2 CAL PO LIQD
720.0000 mL | ORAL | Status: DC
  Administered 2020-11-24: 720 mL

## 2020-11-24 NOTE — Progress Notes (Signed)
[place] PHYSICAL MEDICINE & REHABILITATION PROGRESS NOTE   Subjective/Complaints: No new issues this morning. Reflux at times with D2 diet  ROS: Patient denies fever, rash, sore throat, blurred vision, nausea, vomiting, diarrhea, cough, shortness of breath or chest pain, joint or back pain, headache, or mood change.    Objective:   No results found. No results for input(s): WBC, HGB, HCT, PLT in the last 72 hours. No results for input(s): NA, K, CL, CO2, GLUCOSE, BUN, CREATININE, CALCIUM in the last 72 hours.  Intake/Output Summary (Last 24 hours) at 11/24/2020 1022 Last data filed at 11/24/2020 0700 Gross per 24 hour  Intake 600 ml  Output 800 ml  Net -200 ml   Physical Exam: Vital Signs Blood pressure 115/64, pulse 96, temperature 98.5 F (36.9 C), resp. rate (!) 24, height 5\' 4"  (1.626 m), weight 68.4 kg, SpO2 95 %.  Constitutional: No distress . Vital signs reviewed. HEENT: EOMI, oral membranes moist Neck: supple Cardiovascular: RRR without murmur. No JVD    Respiratory/Chest: CTA Bilaterally without wheezes or rales. Normal effort    GI/Abdomen: BS +, non-tender, non-distended, PEG clean Ext: no clubbing, cyanosis, or edema Psych: flat, cooperative Skin: intact Musc: No edema in extremities.  No tenderness in extremities. Neuro: Alert Persistent dysarthria Motor: RUE/RLE: 4+/5 proximal distal LUE: 4/5 proximal to distal, unchanged LLE: 4/5 hip flexion, knee extension, 1/5 ankle dorsiflexion--stable  Assessment/Plan: 1. Functional deficits which require 3+ hours per day of interdisciplinary therapy in a comprehensive inpatient rehab setting.  Physiatrist is providing close team supervision and 24 hour management of active medical problems listed below.  Physiatrist and rehab team continue to assess barriers to discharge/monitor patient progress toward functional and medical goals  Care Tool:  Bathing    Body parts bathed by patient: Left arm, Chest, Abdomen,  Front perineal area, Right upper leg, Left upper leg, Face, Buttocks, Right lower leg, Left lower leg, Right arm   Body parts bathed by helper: Right arm, Left arm, Buttocks, Left upper leg, Right lower leg, Right upper leg, Left lower leg     Bathing assist Assist Level: Contact Guard/Touching assist     Upper Body Dressing/Undressing Upper body dressing   What is the patient wearing?: Pull over shirt    Upper body assist Assist Level: Minimal Assistance - Patient > 75%    Lower Body Dressing/Undressing Lower body dressing      What is the patient wearing?: Pants     Lower body assist Assist for lower body dressing: Contact Guard/Touching assist     Toileting Toileting    Toileting assist Assist for toileting: Minimal Assistance - Patient > 75%     Transfers Chair/bed transfer  Transfers assist     Chair/bed transfer assist level: Supervision/Verbal cueing Chair/bed transfer assistive device:   Ambulation assist      Assist level: Contact Guard/Touching assist Assistive device: No Device Max distance: 180 ft   Walk 10 feet activity   Assist     Assist level: Contact Guard/Touching assist Assistive device: No Device   Walk 50 feet activity   Assist Walk 50 feet with 2 turns activity did not occur: Safety/medical concerns (fatigue, weakness, decreased balance/postural control)  Assist level: Contact Guard/Touching assist Assistive device: No Device    Walk 150 feet activity   Assist Walk 150 feet activity did not occur: Safety/medical concerns (fatigue, weakness, decreased balance/postural control)  Assist level: Contact Guard/Touching assist Assistive device: No Device  Walk 10 feet on uneven surface  activity   Assist Walk 10 feet on uneven surfaces activity did not occur: Safety/medical concerns (fatigue, weakness, decreased balance/postural control, poor safety awareness)          Wheelchair     Assist Will patient use wheelchair at discharge?: Yes Type of Wheelchair: Manual Wheelchair activity did not occur: Refused  Wheelchair assist level: Dependent - Patient 0%      Wheelchair 50 feet with 2 turns activity    Assist        Assist Level: Dependent - Patient 0%   Wheelchair 150 feet activity     Assist      Assist Level: Dependent - Patient 0%   Blood pressure 115/64, pulse 96, temperature 98.5 F (36.9 C), resp. rate (!) 24, height 5\' 4"  (1.626 m), weight 68.4 kg, SpO2 95 %.  Medical Problem List and Plan: 1.  Decreased functional mobility secondary to acute hypoxic hypercapnic respiratory failure with aspiration/ventilatory support pneumonia  Continue CIR 2.  Antithrombotics: -DVT/anticoagulation: Subcutaneous heparin             -antiplatelet therapy: N/A 3. Pain Management: Denies pain. Not using oxycodone- will d/c 4. Mood: no SSRIs             -antipsychotic agents: Seroquel 400 mg twice daily 5. Neuropsych: This patient is not capable of making decisions on his own behalf. 6. Skin/Wound Care: Routine skin checks 7. Fluids/Electrolytes/Nutrition: pt eating 90-100%  -would like to decrease TF further 12/3  -check bmet/prealbumin tomorrow 8.  Paraesophageal hernia/massively dilated esophagus.  Status post hernia repair percutaneous gastrostomy tube placement 10/26/2020 per cardiothoracic surgery Dr. 13/02/2020 changed to placement of jejunal arm via indwelling gastrostomy tube 11/07/2020 per interventional radiology.    -Continue Protonix twice daily, MiraLAX twice daily, Colace twice daily.   9.  Iron deficiency anemia.    Hemoglobin 11.2 on 11/24  Continue to monitor 10.  Schizophrenia/intellectual delay/seizure disorder.  Valproic acid 250 mg twice daily, Lamictal 100 mg daily, seroquel 400mg  bid  Sz free since admission to CIR-11/28 11.  Hypothyroidism.  Synthroid 12.  Type 2 diabetes mellitus with hyperglycemia  .   Hemoglobin A1c 5.8.  SSI.  Continue SSI  12/2 HS TF decreased to 60cc over 12hr   -CBG's in mid to high 100's   -observe today 13.  Hyperlipidemia.  Crestor 14.  History of CVA with left-sided residual weakness continue therapies as directed 15.  Dysphagia  HS TF as above  CT angio chest personally reviewed-esophagus looks relatively stable, esophagram unremarkable for leaks  -D2/thin diet recommended after MBS 11/30 16.  Hyponatremia  Sodium 131 on 11/26, up to 132 on 11/29.   LOS: 9 days A FACE TO FACE EVALUATION WAS PERFORMED  12/26 11/24/2020, 10:22 AM

## 2020-11-24 NOTE — Progress Notes (Addendum)
Patient ID: [name], male   DOB: [birth year], 64 y.o.   MRN: [id number]  SW spoke with Melissa/Risk Management to discuss pt sister concerns with regard to dentures. Reports will begin an investigation and will follow-up with the sister to discuss further.  Per medical team, pt will d/c on 12/14 to allow further gains to be made in rehab and resolve issues with housing.   SW called pt sister [name] [phone]) and was on conference call with her and Tabitha/RN with [facility]. Tabitha reported that the LOC was signed and sent to [facility] yesterday and waiting on a response. Sheila/administrotr requested for it to be expedited. Once there has been a response from [facility], pt will be admitted back into program. Will call on Monday morning to confirm if meeting at 12:30pm will occur. SW informed on change in pt d/c date to 12/14; and hopes that pt will be completely off tube feeds, and should be Sup-Mod I at time of d/c.  Cecile Sheerer, MSW, LCSWA Office: [phone] Cell: [phone] Fax: [phone]

## 2020-11-24 NOTE — Progress Notes (Signed)
Calorie Count Note: Day 1  48-hour calorie count ordered. Calorie count started yesterday at lunch meal. Please see Day 1 results below. RD to provide Day 2 results tomorrow, 11/25/20.  Diet: dysphagia 2 with thin liquids Supplements: - Ensure Enlive po BID, each supplement provides 350 kcal and 20 grams of protein  Day 1: Lunch: 216 kcal, 18 grams of protein Dinner: 607 kcal, 25 grams of protein Breakfast: 392 kcal, 18 grams of protein Supplements: 350 kcal, 20 grams of protein (1 Ensure Enlive)  Day 1 total 24-hour intake: 1565 kcal (82% of minimum estimated needs)  81 grams of protein (85% of minimum estimated needs)  Nutrition Diagnosis: Inadequate oral intake related to dysphagia as evidenced by NPO status. - Progressing  Goal: Patient will meet greater than or equal to 90% of their needs - Progressing  Intervention: - Continue 48-hour calorie count - Continue Ensure Enlive BID - Decrease nocturnal tube feeds via J-port of Osmolite 1.2 to 60 ml/hr to run for 12 hours to provide 864 kcal and 40 grams of protein - Continue free water flushes   Mertie Clause, MS, RD, LDN Inpatient Clinical Dietitian Please see [facility] for contact information.

## 2020-11-24 NOTE — Progress Notes (Signed)
Speech Language Pathology Daily Session Note  Patient Details  Name: [name] MRN: [id number] Date of Birth: 12-06-1956  Today's Date: 11/24/2020 SLP Individual Time: 1215-1245 SLP Individual Time Calculation (min): 30 min  Short Term Goals: Week 2: SLP Short Term Goal 1 (Week 2): STGs=LTGs due to ELOS  Skilled Therapeutic Interventions: Skilled SLP intervention focused on dysphagia goals. SLP facilitated session by providing overall Mod A verbal cues for use of small bites/sips and a slow rate of self-feeding. Due to increased cuing, patient demonstrated only 1 episode of coughing.  Recommend patient continue current diet with full supervision. Patient left upright in the wheelchair with alarm on and all needs within reach. Continue with current plan of care.      Pain No/Denies Pain   Therapy/Group: Individual Therapy  Dorine Duffey 11/24/2020, 12:55 PM

## 2020-11-24 NOTE — Progress Notes (Signed)
Physical Therapy Session Note  Patient Details  Name: [name] MRN: [id number] Date of Birth: 1956/09/18  Today's Date: 11/24/2020 PT Individual Time: 0815-0900 PT Individual Time Calculation: 45 min   Short Term Goals: Week 2:  PT Short Term Goal 1 (Week 2): Patient will perform basic transfers with supervision consistently. PT Short Term Goal 2 (Week 2): Patient will ambulate >150 ft with close supervision for safety using LRAD. PT Short Term Goal 3 (Week 2): Patient will perform dynamic balance activities in standing >10 min.  Skilled Therapeutic Interventions/Progress Updates:     Patient in bed with RN in the room upon PT arrival. Patient alert and agreeable to PT session. Patient denied pain during session.  RN provided pain medicine at beginning of session and patient had to have BM. Patient missed 15 min of skilled PT due to nursing care and toileting, RN made aware. Will attempt to make-up missed time as able.    Therapeutic Activity: Bed Mobility: Patient performed supine to sit with supervision for safety due to impulsivity. Patient sat EOB performing dynamic sitting balance and donned pants with max A, a t-shirt with mod A, and tennis shoes with supervision. Increased assist for energy/time management Transfers: Patient performed sit to/from stand transfers and a toilet transfer throughout the session with supervision. Provided verbal cues for L foot placement and controlled movement for safety. Patient was continent of bladder and bowl during toileting, performed peri-care independently and lower body clothing management with min A for pulling up L side of pants.   Gait Training:  Patient ambulated >250 feet and ~150 feet x2 without an AD with 1/4 heel lift in L shoe for reduced extensor thrust with gait. Ambulated with decreased L knee/hip flexion and DF in swing compensating with circumduction, especially with fatigue, decreased lateral trunk control, and  intermittent L toe catching, less frequent today with patient demonstrating improved control of gait speed today, no so impulsive and rushed. Provided verbal cues for increased hamstring activation for pre-swing to promote knee flexion, increased trunk and hip stability on R for reduced compensatory strategies, and provided positive reinforcement for reduced gait speed and improved control with gait. Patient ascended/descended 4-6" steps using B rails with CGA. Performed reciprocal gait pattern, despite cues for step-to leading with R for increased safety. Provided cues for technique and sequencing.   Neuromuscular Re-ed: Patient performed the following lower extremity motor control activities: -tall kneeling x30 sec focused on hip extension and trunk control with multimodal cues -attempted bringing R leg forward to kneeling on L knee x3, but patient unable to control L trunk and bring his R leg forward safety with 1 person assist, patient with poor frustration tolerance and increased fatigue with attempts -step tap to second step on 6" stairs with L foot 2x10, placed red dot on step for visual target and focused on hamstring and hip flexor activation without compensating with R trunk lean  Patient in w/c in the room at end of session with breaks locked, seat belt alarm set, and all needs within reach.    Therapy Documentation Precautions:  Precautions Precautions: Fall Precaution Comments: L hemiparesis, PEG Restrictions Weight Bearing Restrictions: No   Therapy/Group: Individual Therapy  Helayne Seminole PT, DPT' 11/24/2020, 4:19 PM

## 2020-11-24 NOTE — Progress Notes (Signed)
Occupational Therapy Session Note  Patient Details  Name: [name] MRN: [id number] Date of Birth: [birth year]  Today's Date: 11/24/2020 OT Individual Time: 1002-1056 OT Individual Time Calculation (min): 54 min    Short Term Goals: Week 1:  OT Short Term Goal 1 (Week 1): Pt will wait to initiate transfer till after w/c brakes are locked to demo improved safety awareness wiht no VC OT Short Term Goal 1 - Progress (Week 1): Progressing toward goal OT Short Term Goal 2 (Week 1): Pt will transfer to toielt wiht CGA OT Short Term Goal 2 - Progress (Week 1): Met OT Short Term Goal 3 (Week 1): Pt will don LB clothing wiht A only for balance OT Short Term Goal 3 - Progress (Week 1): Met OT Short Term Goal 4 (Week 1): Pt will complete 2/3 steps of toileting OT Short Term Goal 4 - Progress (Week 1): Met OT Short Term Goal 5 (Week 1): Pt will bathe with MIN A and no more than min VC for sequencing/thoroughness OT Short Term Goal 5 - Progress (Week 1): Met  Skilled Therapeutic Interventions/Progress Updates:    1:1. Pt received in w.c agreeable to OT with no pain and declining shower. Pt completes ambulation throughotu session into/out of bathroom and to/from dayroom with CGA-MIN A overall with no AD. Intermittent L foot drag increasing A. Pt completes toileting with CGA in standing and step over shower stall trasnfer 5x with MOD A initially as pt stepping too far away from ledge with L foot and heel catching on top of the ledge. Massed practice provided and pt improves to CGA for last 2 trials. Pt completes standing card match with 60% cuing to notice/respond to errors in matching and S for static standing balance and MIN A on compliant wedge. Exited session with pt seated in w/c, exit alarm on and call light in reach   Therapy Documentation Precautions:  Precautions Precautions: Fall Precaution Comments: L hemiparesis, PEG Restrictions Weight Bearing Restrictions: No General:    Vital Signs: Therapy Vitals Temp: 98.5 F (36.9 C) Pulse Rate: 96 Resp: (!) 24 BP: 115/64 Patient Position (if appropriate): Lying Oxygen Therapy SpO2: 95 % Pain:   ADL: ADL Grooming: Minimal assistance Where Assessed-Grooming: Sitting at sink Upper Body Bathing: Minimal assistance Where Assessed-Upper Body Bathing: Sitting at sink Lower Body Bathing: Moderate assistance Where Assessed-Lower Body Bathing: Sitting at sink, Standing at sink Upper Body Dressing: Moderate assistance Where Assessed-Upper Body Dressing: Sitting at sink Lower Body Dressing: Maximal assistance Where Assessed-Lower Body Dressing: Standing at sink, Sitting at sink Toileting: Maximal assistance Where Assessed-Toileting: Glass blower/designer: Psychiatric nurse Method: Arts development officer: Systems analyst    Praxis   Exercises:   Other Treatments:     Therapy/Group: Individual Therapy  Tonny Branch 11/24/2020, 6:48 AM

## 2020-11-24 NOTE — Progress Notes (Signed)
Occupational Therapy Session Note  Patient Details  Name: LAQUENTIN LOUDERMILK MRN: [id number] Date of Birth: May 07, 1956  Today's Date: 11/24/2020 OT Individual Time: 1345-1430 OT Individual Time Calculation (min): 45 min    Short Term Goals: Week 2:  OT Short Term Goal 1 (Week 2): STG=LTG d/t ELOS  Skilled Therapeutic Interventions/Progress Updates:    Pt resting in w/c upon arrival and agreeable to therapy. OT intervention with focus on standing balance while completing table tasks.  Pt placed and removed pegs on peg board and clothes pins on dowels. Pt required max verbal cues for placement of pegs but quickly replicated pattern when demonstrated. Pt performed standing activties X 4 with CGA for standing balance. Pt returned to room and requested to use bathroom. Pt amb with RW (CGA) into bathroom and stood at toilet. Pt unable to void. Pt returned to room and stood at sink to wash ands. Pt remained in w/c with all needs within reach and belt alarm activated.   Therapy Documentation Precautions:  Precautions Precautions: Fall Precaution Comments: L hemiparesis, PEG Restrictions Weight Bearing Restrictions: No  Pain:  Pt denies pain this afternoon   Therapy/Group: Individual Therapy  Rich Brave 11/24/2020, 2:45 PM

## 2020-11-25 ENCOUNTER — Inpatient Hospital Stay (HOSPITAL_COMMUNITY): Payer: Medicare Other | Admitting: Physical Therapy

## 2020-11-25 ENCOUNTER — Inpatient Hospital Stay (HOSPITAL_COMMUNITY): Payer: Medicare Other

## 2020-11-25 DIAGNOSIS — R5381 Other malaise: Secondary | ICD-10-CM | POA: Diagnosis not present

## 2020-11-25 DIAGNOSIS — E871 Hypo-osmolality and hyponatremia: Secondary | ICD-10-CM | POA: Diagnosis not present

## 2020-11-25 DIAGNOSIS — R1319 Other dysphagia: Secondary | ICD-10-CM | POA: Diagnosis not present

## 2020-11-25 DIAGNOSIS — R569 Unspecified convulsions: Secondary | ICD-10-CM | POA: Diagnosis not present

## 2020-11-25 LAB — CBC
HCT: 36.1 % — ABNORMAL LOW (ref 39.0–52.0)
Hemoglobin: 11 g/dL — ABNORMAL LOW (ref 13.0–17.0)
MCH: 26 pg (ref 26.0–34.0)
MCHC: 30.5 g/dL (ref 30.0–36.0)
MCV: 85.3 fL (ref 80.0–100.0)
Platelets: 352 10*3/uL (ref 150–400)
RBC: 4.23 MIL/uL (ref 4.22–5.81)
RDW: 18.1 % — ABNORMAL HIGH (ref 11.5–15.5)
WBC: 5.6 10*3/uL (ref 4.0–10.5)
nRBC: 0 % (ref 0.0–0.2)

## 2020-11-25 LAB — TROPONIN I (HIGH SENSITIVITY)
Troponin I (High Sensitivity): 5 ng/L (ref <18)
Troponin I (High Sensitivity): 5 ng/L (ref <18)

## 2020-11-25 LAB — GLUCOSE, CAPILLARY
Glucose-Capillary: 153 mg/dL — ABNORMAL HIGH (ref 70–99)
Glucose-Capillary: 182 mg/dL — ABNORMAL HIGH (ref 70–99)

## 2020-11-25 LAB — BASIC METABOLIC PANEL
Anion gap: 13 (ref 5–15)
BUN: 15 mg/dL (ref 8–23)
CO2: 25 mmol/L (ref 22–32)
Calcium: 9.3 mg/dL (ref 8.9–10.3)
Chloride: 96 mmol/L — ABNORMAL LOW (ref 98–111)
Creatinine, Ser: 0.95 mg/dL (ref 0.61–1.24)
GFR, Estimated: >60 mL/min (ref >60)
Glucose, Bld: 158 mg/dL — ABNORMAL HIGH (ref 70–99)
Potassium: 4.4 mmol/L (ref 3.5–5.1)
Sodium: 134 mmol/L — ABNORMAL LOW (ref 135–145)

## 2020-11-25 LAB — PREALBUMIN: Prealbumin: 34.6 mg/dL (ref 18–38)

## 2020-11-25 MED ORDER — LEVOTHYROXINE SODIUM 75 MCG PO TABS
75.0000 ug | ORAL_TABLET | Freq: Every day | ORAL | Status: DC
  Administered 2020-11-26 – 2020-12-07 (×12): 75 ug via ORAL
  Filled 2020-11-25 (×12): qty 1

## 2020-11-25 MED ORDER — DIVALPROEX SODIUM 250 MG PO DR TAB
250.0000 mg | DELAYED_RELEASE_TABLET | Freq: Two times a day (BID) | ORAL | Status: DC
  Administered 2020-11-25 – 2020-12-01 (×13): 250 mg via ORAL
  Filled 2020-11-25 (×14): qty 1

## 2020-11-25 MED ORDER — FREE WATER
50.0000 mL | Freq: Two times a day (BID) | Status: DC
  Administered 2020-11-25 – 2020-12-07 (×24): 50 mL

## 2020-11-25 MED ORDER — LAMOTRIGINE 100 MG PO TABS
100.0000 mg | ORAL_TABLET | Freq: Every day | ORAL | Status: DC
  Administered 2020-11-26 – 2020-12-07 (×12): 100 mg via ORAL
  Filled 2020-11-25 (×12): qty 1

## 2020-11-25 MED ORDER — PANTOPRAZOLE SODIUM 40 MG PO TBEC
40.0000 mg | DELAYED_RELEASE_TABLET | Freq: Two times a day (BID) | ORAL | Status: DC
  Administered 2020-11-25 – 2020-12-07 (×24): 40 mg via ORAL
  Filled 2020-11-25 (×6): qty 1
  Filled 2020-11-25: qty 2
  Filled 2020-11-25 (×12): qty 1
  Filled 2020-11-25: qty 2
  Filled 2020-11-25 (×4): qty 1

## 2020-11-25 MED ORDER — QUETIAPINE FUMARATE 200 MG PO TABS
400.0000 mg | ORAL_TABLET | Freq: Two times a day (BID) | ORAL | Status: DC
  Administered 2020-11-25 – 2020-12-07 (×24): 400 mg via ORAL
  Filled 2020-11-25 (×24): qty 2

## 2020-11-25 MED ORDER — POLYETHYLENE GLYCOL 3350 17 G PO PACK
17.0000 g | PACK | Freq: Two times a day (BID) | ORAL | Status: DC
  Administered 2020-11-25 – 2020-12-07 (×23): 17 g via ORAL
  Filled 2020-11-25 (×23): qty 1

## 2020-11-25 MED ORDER — DOCUSATE SODIUM 100 MG PO CAPS
100.0000 mg | ORAL_CAPSULE | Freq: Two times a day (BID) | ORAL | Status: DC
  Administered 2020-11-25 – 2020-12-07 (×25): 100 mg via ORAL
  Filled 2020-11-25 (×25): qty 1

## 2020-11-25 MED ORDER — INSULIN ASPART 100 UNIT/ML ~~LOC~~ SOLN
0.0000 [IU] | Freq: Three times a day (TID) | SUBCUTANEOUS | Status: DC
  Administered 2020-11-25: 3 [IU] via SUBCUTANEOUS

## 2020-11-25 MED ORDER — ROSUVASTATIN CALCIUM 20 MG PO TABS
20.0000 mg | ORAL_TABLET | Freq: Every day | ORAL | Status: DC
  Administered 2020-11-26 – 2020-12-07 (×12): 20 mg via ORAL
  Filled 2020-11-25 (×12): qty 1

## 2020-11-25 NOTE — Progress Notes (Signed)
Contacted by RN regarding patient's irregular heart beat and concerns of Afib/A flutter. EKG ordered showing LVH which is different c/w pre procedure EKG. Discussed with Dr. Dellia Beckwith who evaluated EKG changes and confirmed that patient not dehydrated, not symptomatic--no CP/SOB reported. Trop's and repeat 2D echo ordered per recommendations and cardiology will follow up in am for input after review of labs/studies.

## 2020-11-25 NOTE — Progress Notes (Signed)
[place] PHYSICAL MEDICINE & REHABILITATION PROGRESS NOTE   Subjective/Complaints: Pt up with therapy at EOB. No complaints. Eating very well! No pain  ROS: Patient denies fever, rash, sore throat, blurred vision, nausea, vomiting, diarrhea, cough, shortness of breath or chest pain, joint or back pain, headache, or mood change.    Objective:   No results found. Recent Labs    11/25/20 0545  WBC 5.6  HGB 11.0*  HCT 36.1*  PLT 352   Recent Labs    11/25/20 0545  NA 134*  K 4.4  CL 96*  CO2 25  GLUCOSE 158*  BUN 15  CREATININE 0.95  CALCIUM 9.3    Intake/Output Summary (Last 24 hours) at 11/25/2020 1056 Last data filed at 11/25/2020 0730 Gross per 24 hour  Intake 760 ml  Output 850 ml  Net -90 ml   Physical Exam: Vital Signs Blood pressure 123/73, pulse 94, temperature 98.2 F (36.8 C), temperature source Oral, resp. rate 15, height 5\' 4"  (1.626 m), weight 68.4 kg, SpO2 97 %.  Constitutional: No distress . Vital signs reviewed. HEENT: EOMI, oral membranes moist Neck: supple Cardiovascular: RRR without murmur. No JVD    Respiratory/Chest: CTA Bilaterally without wheezes or rales. Normal effort    GI/Abdomen: BS +, non-tender, non-distended, sl drainage at PEG Ext: no clubbing, cyanosis, or edema Psych: pleasant but flat. Skin: intact Musc: No edema in extremities.  No tenderness in extremities. Neuro: Alert Persistent dysarthria Motor: RUE/RLE: 4+/5 proximal distal LUE: 4/5 proximal to distal, unchanged LLE: 4/5 hip flexion, knee extension, 1/5 ankle dorsiflexion--stable  Assessment/Plan: 1. Functional deficits which require 3+ hours per day of interdisciplinary therapy in a comprehensive inpatient rehab setting.  Physiatrist is providing close team supervision and 24 hour management of active medical problems listed below.  Physiatrist and rehab team continue to assess barriers to discharge/monitor patient progress toward functional and medical  goals  Care Tool:  Bathing    Body parts bathed by patient: Left arm, Chest, Abdomen, Front perineal area, Right upper leg, Left upper leg, Face, Buttocks, Right lower leg, Left lower leg, Right arm   Body parts bathed by helper: Right arm, Left arm, Buttocks, Left upper leg, Right lower leg, Right upper leg, Left lower leg     Bathing assist Assist Level: Contact Guard/Touching assist     Upper Body Dressing/Undressing Upper body dressing   What is the patient wearing?: Pull over shirt    Upper body assist Assist Level: Minimal Assistance - Patient > 75%    Lower Body Dressing/Undressing Lower body dressing      What is the patient wearing?: Pants     Lower body assist Assist for lower body dressing: Contact Guard/Touching assist     Toileting Toileting    Toileting assist Assist for toileting: Contact Guard/Touching assist     Transfers Chair/bed transfer  Transfers assist     Chair/bed transfer assist level: Supervision/Verbal cueing Chair/bed transfer assistive device:   Ambulation assist      Assist level: Contact Guard/Touching assist Assistive device: No Device Max distance: 180 ft   Walk 10 feet activity   Assist     Assist level: Contact Guard/Touching assist Assistive device: No Device   Walk 50 feet activity   Assist Walk 50 feet with 2 turns activity did not occur: Safety/medical concerns (fatigue, weakness, decreased balance/postural control)  Assist level: Contact Guard/Touching assist Assistive device: No Device    Walk 150 feet activity  Assist Walk 150 feet activity did not occur: Safety/medical concerns (fatigue, weakness, decreased balance/postural control)  Assist level: Contact Guard/Touching assist Assistive device: No Device    Walk 10 feet on uneven surface  activity   Assist Walk 10 feet on uneven surfaces activity did not occur: Safety/medical concerns (fatigue, weakness,  decreased balance/postural control, poor safety awareness)         Wheelchair     Assist Will patient use wheelchair at discharge?: Yes Type of Wheelchair: Manual Wheelchair activity did not occur: Refused  Wheelchair assist level: Dependent - Patient 0%      Wheelchair 50 feet with 2 turns activity    Assist        Assist Level: Dependent - Patient 0%   Wheelchair 150 feet activity     Assist      Assist Level: Dependent - Patient 0%   Blood pressure 123/73, pulse 94, temperature 98.2 F (36.8 C), temperature source Oral, resp. rate 15, height 5\' 4"  (1.626 m), weight 68.4 kg, SpO2 97 %.  Medical Problem List and Plan: 1.  Decreased functional mobility secondary to acute hypoxic hypercapnic respiratory failure with aspiration/ventilatory support pneumonia  Continue CIR 2.  Antithrombotics: -DVT/anticoagulation: Subcutaneous heparin             -antiplatelet therapy: N/A 3. Pain Management: Denies pain. Not using oxycodone- will d/c 4. Mood: no SSRIs             -antipsychotic agents: Seroquel 400 mg twice daily 5. Neuropsych: This patient is not capable of making decisions on his own behalf. 6. Skin/Wound Care: Routine skin checks 7. Fluids/Electrolytes/Nutrition: pt eating well, calorie ct looks great! Prealbumin 34  -dc TF and H20 flushes 8.  Paraesophageal hernia/massively dilated esophagus.  Status post hernia repair percutaneous gastrostomy tube placement 10/26/2020 per cardiothoracic surgery Dr. 13/02/2020 changed to placement of jejunal arm via indwelling gastrostomy tube 11/07/2020 per interventional radiology.    -Continue Protonix twice daily, MiraLAX twice daily, Colace twice daily.    -remove GJtube as outpt 9.  Iron deficiency anemia.    Hemoglobin 11.0 12/3  Continue to monitor 10.  Schizophrenia/intellectual delay/seizure disorder.  Valproic acid 250 mg twice daily, Lamictal 100 mg daily, seroquel 400mg  bid  Sz free since admission to  CIR-11/28 11.  Hypothyroidism.  Synthroid 12.  Type 2 diabetes mellitus with hyperglycemia  .  Hemoglobin A1c 5.8.  SSI.  Continue SSI  12/3 change SSI to coincide with meals 13.  Hyperlipidemia.  Crestor 14.  History of CVA with left-sided residual weakness continue therapies as directed 15.  Dysphagia  HS TF as above  CT angio chest personally reviewed-esophagus looks relatively stable, esophagram unremarkable for leaks  -D2/thin diet recommended after MBS 11/30 16.  Hyponatremia  Sodium 134 12/3  LOS: 10 days A FACE TO FACE EVALUATION WAS PERFORMED  12/30 11/25/2020, 10:56 AM

## 2020-11-25 NOTE — Progress Notes (Signed)
Physical Therapy Session Note  Patient Details  Name: [name] MRN: [id number] Date of Birth: 02/14/1956  Today's Date: 11/25/2020 PT Individual Time: 1300-1355 PT Individual Time Calculation (min): 55 min   Short Term Goals: Week 2:  PT Short Term Goal 1 (Week 2): Patient will perform basic transfers with supervision consistently. PT Short Term Goal 2 (Week 2): Patient will ambulate >150 ft with close supervision for safety using LRAD. PT Short Term Goal 3 (Week 2): Patient will perform dynamic balance activities in standing >10 min.  Skilled Therapeutic Interventions/Progress Updates:    Patient received sitting up in wc, agreeable to PT. He denies pain. PT propelling patient in wc to therapy gym for time management and energy conservation. He was able to complete sit <>stand with RW and close supervision. He demonstrates impulsivity often standing up before L LE set and PT ready. He requires CGA to maintain safe dynamic standing balance. Patient completing game of Dorisann Frames with MaxA to complete math accurately. Decreased standing endurance, requiring multiple seated rest breaks throughout. This could be due to poor endurance or poor attention to task. PT applying dorsflexion wrap to L ankle. He was able to ambulate ~246ft with MinA and RW + verbal cues to maintain slow, steady pace. Without encouragement of slow pace, patient fluctuating between safe pace and very quick pace. Decreased L knee flexion and compensatory circumduction noted. R lateral trunk lean also utilized to compensate and increase L foot clearance during swing phase. Patient completing pre-gait exercises tapping L foot to cone with 2nd cone paced laterally to prevent circumduction. He had significant difficulty completing this task initially and would compensate for decreased L hip/knee flexion with posterior trunk lean. With Max verbal cuing and tactile facilitation, patient able to achieve adequate L foot clearance to  tap cone. He did demonstrate impulsivity with this, continuing to attempt to tap cone when when dynamic balance was failing resulting in PT needing to provide up to MinA to prevent LOB. Patient propelling wc using B LE for further engagement of L hamstrings. With fatigue/decresed attention to task, patient resorting to only using R LE. Patient returning to room in wc, seatbelt alarm on, call light within reach.   Therapy Documentation Precautions:  Precautions Precautions: Fall Precaution Comments: L hemiparesis, PEG Restrictions Weight Bearing Restrictions: No    Therapy/Group: Individual Therapy  Elizebeth Koller, PT, DPT, CBIS  11/25/2020, 1:39 PM

## 2020-11-25 NOTE — Progress Notes (Signed)
Occupational Therapy Session Note  Patient Details  Name: [name] MRN: [id number] Date of Birth: 06/14/1956  Today's Date: 11/25/2020 OT Individual Time: 4742-5956 OT Individual Time Calculation (min): 72 min    Short Term Goals: Week 1:  OT Short Term Goal 1 (Week 1): Pt will wait to initiate transfer till after w/c brakes are locked to demo improved safety awareness wiht no VC OT Short Term Goal 1 - Progress (Week 1): Progressing toward goal OT Short Term Goal 2 (Week 1): Pt will transfer to toielt wiht CGA OT Short Term Goal 2 - Progress (Week 1): Met OT Short Term Goal 3 (Week 1): Pt will don LB clothing wiht A only for balance OT Short Term Goal 3 - Progress (Week 1): Met OT Short Term Goal 4 (Week 1): Pt will complete 2/3 steps of toileting OT Short Term Goal 4 - Progress (Week 1): Met OT Short Term Goal 5 (Week 1): Pt will bathe with MIN A and no more than min VC for sequencing/thoroughness OT Short Term Goal 5 - Progress (Week 1): Met  Skilled Therapeutic Interventions/Progress Updates:     Pt received in bed with no pain reported agreeable to shower. Occlusives applied to cover PEG.  ADL:  Pt completes bathing with CGA sit to stand with Vc for safety awareness leaning forward not to bump head into grab bar Pt completes UB dressing with with CGA in standing to don Tshirt and VC for pulling all down L side Pt completes LB dressing with CGA at sit to stand level. VC for orientation to pt Pt completes footwear with setup seated EOB Pt completes toileting with CGA for clothing management in stanidng Pt completes toileting transfer with CGA at ambulatory level Pt completes shower/Tub transfer with CGA at [facility] level     Therapeutic activity Total A w/c propulsion to/from destination for energy conservaiton/time. Pt completes standing balance activities with cognitive component: pipe tree activity (with max VC for assembling simple cross figure and min VC  for ield goal when needed pieces already selected) and sorting activity (for L attention and cognition sorting cubes by colors-2errors and then shapes painted on blocks-12 errors).   Pt left at end of session in w/c with exit alarm on, call light in reach and all needs met   Therapy Documentation Precautions:  Precautions Precautions: Fall Precaution Comments: L hemiparesis, PEG Restrictions Weight Bearing Restrictions: No General   Vital Signs: Therapy Vitals Temp: 98.2 F (36.8 C) Temp Source: Oral Pulse Rate: 94 Resp: 15 BP: 123/73 Patient Position (if appropriate): Lying Oxygen Therapy SpO2: 97 % O2 Device: Room Air Pain:   ADL: ADL Grooming: Minimal assistance Where Assessed-Grooming: Sitting at sink Upper Body Bathing: Minimal assistance Where Assessed-Upper Body Bathing: Sitting at sink Lower Body Bathing: Moderate assistance Where Assessed-Lower Body Bathing: Sitting at sink, Standing at sink Upper Body Dressing: Moderate assistance Where Assessed-Upper Body Dressing: Sitting at sink Lower Body Dressing: Maximal assistance Where Assessed-Lower Body Dressing: Standing at sink, Sitting at sink Toileting: Maximal assistance Where Assessed-Toileting: Glass blower/designer: Psychiatric nurse Method: Arts development officer: Systems analyst    Praxis   Exercises:   Other Treatments:     Therapy/Group: Individual Therapy  Tonny Branch 11/25/2020, 6:53 AM

## 2020-11-25 NOTE — Progress Notes (Signed)
Physical Therapy Session Note  Patient Details  Name: [name] MRN: [id number] Date of Birth: 08/14/1956  Today's Date: 11/25/2020 PT Individual Time: 1001-1045 PT Individual Time Calculation (min): 44 min   Short Term Goals: Week 1:  PT Short Term Goal 1 (Week 1): pt will transfer bed<>chair with LRAD and CGA PT Short Term Goal 1 - Progress (Week 1): Met PT Short Term Goal 2 (Week 1): Pt will ambulate 64f with LRAD and CGA PT Short Term Goal 2 - Progress (Week 1): Met PT Short Term Goal 3 (Week 1): Pt will perform simulated car transfer with LRAD and min A PT Short Term Goal 3 - Progress (Week 1): Met Week 2:  PT Short Term Goal 1 (Week 2): Patient will perform basic transfers with supervision consistently. PT Short Term Goal 2 (Week 2): Patient will ambulate >150 ft with close supervision for safety using LRAD. PT Short Term Goal 3 (Week 2): Patient will perform dynamic balance activities in standing >10 min.  Skilled Therapeutic Interventions/Progress Updates:    [facility], denied pain. Pt directed in x5 Sit to stand to Rolling walker with hand grip assist in place, min A for safety with pt demonstrating impulsive behavior and benefited from VC for technique and decreased speed of activity and hand placement. Pt then reported he needed to use restroom and directed in gait training with Rolling walker to room 125' min A for stability VC for step pattern on LLE. Pt about to (+) bladder void in standing at commode, min A for stability. Pt then directed in hand hygiene with sanitizer in standing, required short sitting rest break then agreeable to gait training with Rolling walker to return to gym, min A for stability. Pt took short seated rest break in WC once in gym, then directed in gait with cone weaving x5 cones at 20' no AD and completed x2 min A grossly with VC for technique and safety awareness, increased  step length on LLE and one LOB with mod A for correction. Pt directed in standing NMRE with reaching with RUE outside BOS from overhead and ground levels CGA with retrieving and handing PT back 3 x12 horseshoes from various heights and reaching distances, min A for safety and intermittent CGA. Pt directed in gait without AD to room at end session 125' min A with similar VC for technique. Pt left in WC, alarm belt set, All needs in reach and in good condition. Call light in hand.    Therapy Documentation Precautions:  Precautions Precautions: Fall Precaution Comments: L hemiparesis, PEG Restrictions Weight Bearing Restrictions: No General:   Vital Signs:   Pain:   Mobility:   Locomotion :    Trunk/Postural Assessment :    Balance:   Exercises:   Other Treatments:      Therapy/Group: Individual Therapy  HJunie Panning12/02/2020, 12:55 PM

## 2020-11-25 NOTE — Progress Notes (Signed)
Calorie Count Note: Day 2  48-hour calorie count ordered. Calorie count started 11/23/20 at lunch meal. Please see Day 1 results in note from yesterday Please see Day 2 results below.  Diet: dysphagia 2 with thin liquids Supplements: - Ensure Enlive po BID, each supplement provides 350 kcal and 20 grams of protein  Day 2: 11/24/20 Lunch: 824 kcal, 39 grams of protein 11/24/20 Dinner: 598 kcal, 32 grams of protein 11/25/20 Breakfast: 785 kcal, 29 grams of protein Supplements: 0 kcal, 0 grams of protein  Day 2 total 24-hour intake: 2207 kcal (>100% of minimum estimated needs)  100 grams of protein (100% of minimum estimated needs)  Nutrition Diagnosis: Inadequate oral intakerelated to dysphagiaas evidenced by NPO status. - Progressing  Goal: Patient will meet greater than or equal to 90% of their needs - Progressing  Intervention: - d/c 48-hour calorie count - Continue Ensure Enlive BID - Recommend d/c nocturnal TF, will discuss with MD   Mertie Clause, MS, RD, LDN Inpatient Clinical Dietitian Please see AMiON for contact information.

## 2020-11-26 ENCOUNTER — Inpatient Hospital Stay (HOSPITAL_COMMUNITY): Payer: Medicare Other

## 2020-11-26 DIAGNOSIS — R1319 Other dysphagia: Secondary | ICD-10-CM | POA: Diagnosis not present

## 2020-11-26 DIAGNOSIS — R5381 Other malaise: Secondary | ICD-10-CM | POA: Diagnosis not present

## 2020-11-26 DIAGNOSIS — R9431 Abnormal electrocardiogram [ECG] [EKG]: Secondary | ICD-10-CM | POA: Diagnosis not present

## 2020-11-26 DIAGNOSIS — R569 Unspecified convulsions: Secondary | ICD-10-CM | POA: Diagnosis not present

## 2020-11-26 DIAGNOSIS — E871 Hypo-osmolality and hyponatremia: Secondary | ICD-10-CM | POA: Diagnosis not present

## 2020-11-26 LAB — ECHOCARDIOGRAM LIMITED
Area-P 1/2: 3.99 cm2
Height: 64 in
S' Lateral: 2.2 cm
Weight: 2412.71 oz

## 2020-11-26 NOTE — Consult Note (Signed)
Spoke to Dr Riley Kill  Had reviewed electrocardiogram from 12/4 at 1358 hrs.  Sinus tachycardia and LVH with repolarization abnormalities.  Patient is doing well at this time Dr. Riley Kill does not feel a consultation is not necessary and will call us if we can be of assistance

## 2020-11-26 NOTE — Progress Notes (Signed)
Speech Language Pathology Daily Session Note  Patient Details  Name: [name] MRN: [id number] Date of Birth: 06/16/1956  Today's Date: 11/26/2020 SLP Individual Time: 1345-1415 SLP Individual Time Calculation (min): 30 min  Short Term Goals: Week 2: SLP Short Term Goal 1 (Week 2): STGs=LTGs due to ELOS  Skilled Therapeutic Interventions: Skilled SLP intervention focused on dysphagia. Pt fed self with dys 2 snack and  Thin liquids. Pt stated his dentures were lost by hospital  when asked about inserting them for po trials. Mastication was adequate for dys 2 snack. Swallow reflex appeared timely with thin liquids via straw. Voice was clear after all trials. Cont with therapy per plan of care.       Pain Pain Assessment Pain Scale: Faces Faces Pain Scale: No hurt  Therapy/Group: Individual Therapy  [name] [name] 11/26/2020, 2:12 PM

## 2020-11-26 NOTE — Progress Notes (Signed)
[place] PHYSICAL MEDICINE & REHABILITATION PROGRESS NOTE   Subjective/Complaints: No new complaints. Pt with tachycardia yesterday. EKG unremarkable, demonstrated LVH. Feels fine this morning.   ROS: Patient denies fever, rash, sore throat, blurred vision, nausea, vomiting, diarrhea, cough, shortness of breath or chest pain, joint or back pain, headache, or mood change.    Objective:   No results found. Recent Labs    11/25/20 0545  WBC 5.6  HGB 11.0*  HCT 36.1*  PLT 352   Recent Labs    11/25/20 0545  NA 134*  K 4.4  CL 96*  CO2 25  GLUCOSE 158*  BUN 15  CREATININE 0.95  CALCIUM 9.3    Intake/Output Summary (Last 24 hours) at 11/26/2020 1001 Last data filed at 11/26/2020 0907 Gross per 24 hour  Intake 594 ml  Output --  Net 594 ml   Physical Exam: Vital Signs Blood pressure 116/74, pulse 88, temperature 97.7 F (36.5 C), temperature source Oral, resp. rate 17, height 5\' 4"  (1.626 m), weight 68.4 kg, SpO2 94 %.  Constitutional: No distress . Vital signs reviewed. HEENT: EOMI, oral membranes moist Neck: supple Cardiovascular: RRR without murmur. No JVD    Respiratory/Chest: CTA Bilaterally without wheezes or rales. Normal effort    GI/Abdomen: BS +, non-tender, non-distended Ext: no clubbing, cyanosis, or edema Psych: pleasant and cooperative Skin: intact Musc: No edema in extremities.  No tenderness in extremities. Neuro: Alert Persistent dysarthria Motor: RUE/RLE: 4+/5 proximal distal LUE: 4/5 proximal to distal, unchanged LLE: 4/5 hip flexion, knee extension, 1/5 ankle dorsiflexion--stable  Assessment/Plan: 1. Functional deficits which require 3+ hours per day of interdisciplinary therapy in a comprehensive inpatient rehab setting.  Physiatrist is providing close team supervision and 24 hour management of active medical problems listed below.  Physiatrist and rehab team continue to assess barriers to discharge/monitor patient progress toward  functional and medical goals  Care Tool:  Bathing    Body parts bathed by patient: Left arm, Chest, Abdomen, Front perineal area, Right upper leg, Left upper leg, Face, Buttocks, Right lower leg, Left lower leg, Right arm   Body parts bathed by helper: Right arm, Left arm, Buttocks, Left upper leg, Right lower leg, Right upper leg, Left lower leg     Bathing assist Assist Level: Contact Guard/Touching assist     Upper Body Dressing/Undressing Upper body dressing   What is the patient wearing?: Pull over shirt    Upper body assist Assist Level: Minimal Assistance - Patient > 75%    Lower Body Dressing/Undressing Lower body dressing      What is the patient wearing?: Pants     Lower body assist Assist for lower body dressing: Contact Guard/Touching assist     Toileting Toileting    Toileting assist Assist for toileting: Contact Guard/Touching assist     Transfers Chair/bed transfer  Transfers assist     Chair/bed transfer assist level: Supervision/Verbal cueing Chair/bed transfer assistive device:   Ambulation assist      Assist level: Minimal Assistance - Patient > 75% Assistive device: Walker-rolling Max distance: 200   Walk 10 feet activity   Assist     Assist level: Contact Guard/Touching assist Assistive device: Walker-rolling   Walk 50 feet activity   Assist Walk 50 feet with 2 turns activity did not occur: Safety/medical concerns (fatigue, weakness, decreased balance/postural control)  Assist level: Contact Guard/Touching assist Assistive device: Walker-rolling    Walk 150 feet activity   Assist Walk  150 feet activity did not occur: Safety/medical concerns (fatigue, weakness, decreased balance/postural control)  Assist level: Minimal Assistance - Patient > 75% Assistive device: Walker-rolling    Walk 10 feet on uneven surface  activity   Assist Walk 10 feet on uneven surfaces activity did not  occur: Safety/medical concerns (fatigue, weakness, decreased balance/postural control, poor safety awareness)         Wheelchair     Assist Will patient use wheelchair at discharge?: Yes Type of Wheelchair: Manual Wheelchair activity did not occur: Refused  Wheelchair assist level: Moderate Assistance - Patient 50 - 74% Max wheelchair distance: 150    Wheelchair 50 feet with 2 turns activity    Assist        Assist Level: Moderate Assistance - Patient 50 - 74%   Wheelchair 150 feet activity     Assist      Assist Level: Moderate Assistance - Patient 50 - 74%   Blood pressure 116/74, pulse 88, temperature 97.7 F (36.5 C), temperature source Oral, resp. rate 17, height 5\' 4"  (1.626 m), weight 68.4 kg, SpO2 94 %.  Medical Problem List and Plan: 1.  Decreased functional mobility secondary to acute hypoxic hypercapnic respiratory failure with aspiration/ventilatory support pneumonia  Continue CIR---on track for [facility]. 2.  Antithrombotics: -DVT/anticoagulation: Subcutaneous heparin             -antiplatelet therapy: N/A 3. Pain Management: Denies pain. Not using oxycodone- will d/c 4. Mood: no SSRIs             -antipsychotic agents: Seroquel 400 mg twice daily 5. Neuropsych: This patient is not capable of making decisions on his own behalf. 6. Skin/Wound Care: Routine skin checks 7. Fluids/Electrolytes/Nutrition: pt eating well, calorie ct looks great! Prealbumin 34  -dc'ed TF and H20 flushes  -recheck labs Monday 8.  Paraesophageal hernia/massively dilated esophagus.  Status post hernia repair percutaneous gastrostomy tube placement 10/26/2020 per cardiothoracic surgery Dr. 13/02/2020 changed to placement of jejunal arm via indwelling gastrostomy tube 11/07/2020 per interventional radiology.    -Continue Protonix twice daily, MiraLAX twice daily, Colace twice daily.    -remove GJtube as outpt 9.  Iron deficiency anemia.    Hemoglobin 11.0  12/3  Continue to monitor 10.  Schizophrenia/intellectual delay/seizure disorder.  Valproic acid 250 mg twice daily, Lamictal 100 mg daily, seroquel 400mg  bid  Sz free since admission to CIR-11/28 11.  Hypothyroidism.  Synthroid 12.  Type 2 diabetes mellitus with hyperglycemia  .  Hemoglobin A1c 5.8.  SSI.  Continue SSI  12/3 change SSI to coincide with meals 13.  Hyperlipidemia.  Crestor 14.  History of CVA with left-sided residual weakness continue therapies as directed 15.  Dysphagia  HS TF as above  CT angio chest personally reviewed-esophagus looks relatively stable, esophagram unremarkable for leaks  -D2/thin diet recommended after MBS 11/30 16.  Hyponatremia  Sodium 134 12/3 17. Cards: tachycardia ---resoved  -EKG12/3  with LVH, sinus tachy  -troponins normal x 2  -repeat 2D echo per cardiology  -cardiology to follow up---appreciate their assistance  LOS: 11 days A FACE TO FACE EVALUATION WAS PERFORMED  12/30 11/26/2020, 10:01 AM

## 2020-11-27 DIAGNOSIS — R1319 Other dysphagia: Secondary | ICD-10-CM | POA: Diagnosis not present

## 2020-11-27 DIAGNOSIS — R5381 Other malaise: Secondary | ICD-10-CM | POA: Diagnosis not present

## 2020-11-27 DIAGNOSIS — R569 Unspecified convulsions: Secondary | ICD-10-CM | POA: Diagnosis not present

## 2020-11-27 DIAGNOSIS — E871 Hypo-osmolality and hyponatremia: Secondary | ICD-10-CM | POA: Diagnosis not present

## 2020-11-27 NOTE — Progress Notes (Signed)
[facility] PHYSICAL MEDICINE & REHABILITATION PROGRESS NOTE   Subjective/Complaints: No new issues. Slept well. No pain. Eating very well  ROS: Limited due to cognitive/behavioral     Objective:   ECHOCARDIOGRAM LIMITED  Result Date: 11/26/2020    ECHOCARDIOGRAM LIMITED REPORT   Patient Name:   [name] Date of Exam: 11/26/2020 Medical Rec #:  [id number]            Height:       64.0 in Accession #:    [id number]           Weight:       150.8 lb Date of Birth:  1956/01/18           BSA:          1.735 m Patient Age:    64 years             BP:           116/74 mmHg Patient Gender: M                    HR:           107 bpm. Exam Location:  Inpatient Procedure: 2D Echo Indications:    abnormal ECG 794.31  History:        Patient has prior history of Echocardiogram examinations, most                 recent 10/21/2020.  Sonographer:    Delcie Roch Referring Phys: [id number] HEATHER E PEMBERTON IMPRESSIONS  1. Left ventricular ejection fraction, by estimation, is 65 to 70%. The left ventricle has normal function. There is mild left ventricular hypertrophy. Left ventricular diastolic parameters were normal.  2. Right ventricular systolic function is normal. The right ventricular size is normal. Tricuspid regurgitation signal is inadequate for assessing PA pressure.  3. The mitral valve is normal in structure. No evidence of mitral valve regurgitation.  4. The aortic valve is tricuspid. Aortic valve regurgitation is not visualized. No aortic stenosis is present.  5. The inferior vena cava is normal in size with greater than 50% respiratory variability, suggesting right atrial pressure of 3 mmHg. FINDINGS  Left Ventricle: Left ventricular ejection fraction, by estimation, is 65 to 70%. The left ventricle has normal function. The left ventricular internal cavity size was normal in size. There is mild left ventricular hypertrophy. Left ventricular diastolic  parameters were normal. Right  Ventricle: The right ventricular size is normal. Right vetricular wall thickness was not assessed. Right ventricular systolic function is normal. Tricuspid regurgitation signal is inadequate for assessing PA pressure. Pericardium: There is no evidence of pericardial effusion. Mitral Valve: The mitral valve is normal in structure. Aortic Valve: The aortic valve is tricuspid. Aortic valve regurgitation is not visualized. No aortic stenosis is present. Pulmonic Valve: The pulmonic valve was not well visualized. Pulmonic valve regurgitation is not visualized. Aorta: The aortic root is normal in size and structure. Venous: The inferior vena cava is normal in size with greater than 50% respiratory variability, suggesting right atrial pressure of 3 mmHg. IAS/Shunts: The interatrial septum was not well visualized. LEFT VENTRICLE PLAX 2D LVIDd:         3.60 cm  Diastology LVIDs:         2.20 cm  LV e' medial:    8.05 cm/s LV PW:         1.00 cm  LV E/e' medial:  8.6 LV IVS:  0.80 cm  LV e' lateral:   9.68 cm/s LVOT diam:     1.70 cm  LV E/e' lateral: 7.1 LVOT Area:     2.27 cm  IVC IVC diam: 1.70 cm LEFT ATRIUM         Index LA diam:    3.10 cm 1.79 cm/m   AORTA Ao Root diam: 2.70 cm MITRAL VALVE MV Area (PHT): 3.99 cm    SHUNTS MV Decel Time: 190 msec    Systemic Diam: 1.70 cm MV E velocity: 69.00 cm/s MV A velocity: 93.00 cm/s MV E/A ratio:  0.74 Epifanio Lescheshristopher Schumann MD Electronically signed by Epifanio Lescheshristopher Schumann MD Signature Date/Time: 11/26/2020/5:15:03 PM    Final    Recent Labs    11/25/20 0545  WBC 5.6  HGB 11.0*  HCT 36.1*  PLT 352   Recent Labs    11/25/20 0545  NA 134*  K 4.4  CL 96*  CO2 25  GLUCOSE 158*  BUN 15  CREATININE 0.95  CALCIUM 9.3    Intake/Output Summary (Last 24 hours) at 11/27/2020 1015 Last data filed at 11/27/2020 0400 Gross per 24 hour  Intake 540 ml  Output 675 ml  Net -135 ml   Physical Exam: Vital Signs Blood pressure 114/69, pulse 98, temperature 98.4 F  (36.9 C), temperature source Oral, resp. rate 16, height 5\' 4"  (1.626 m), weight 68.1 kg, SpO2 95 %.  Constitutional: No distress . Vital signs reviewed. HEENT: EOMI, oral membranes moist Neck: supple Cardiovascular: RRR without murmur. No JVD    Respiratory/Chest: CTA Bilaterally without wheezes or rales. Normal effort    GI/Abdomen: BS +, non-tender, non-distended, PEG site clean Ext: no clubbing, cyanosis, or edema Psych: pleasant and cooperative Skin: intact Musc: No edema in extremities.  No tenderness in extremities. Neuro: Alert Persistent dysarthria Motor: RUE/RLE: 4+/5 proximal distal LUE: 4/5 proximal to distal, unchanged LLE: 4/5 hip flexion, knee extension, 1/5 ankle dorsiflexion--stable  Assessment/Plan: 1. Functional deficits which require 3+ hours per day of interdisciplinary therapy in a comprehensive inpatient rehab setting.  Physiatrist is providing close team supervision and 24 hour management of active medical problems listed below.  Physiatrist and rehab team continue to assess barriers to discharge/monitor patient progress toward functional and medical goals  Care Tool:  Bathing    Body parts bathed by patient: Left arm, Chest, Abdomen, Front perineal area, Right upper leg, Left upper leg, Face, Buttocks, Right lower leg, Left lower leg, Right arm   Body parts bathed by helper: Right arm, Left arm, Buttocks, Left upper leg, Right lower leg, Right upper leg, Left lower leg     Bathing assist Assist Level: Contact Guard/Touching assist     Upper Body Dressing/Undressing Upper body dressing   What is the patient wearing?: Pull over shirt    Upper body assist Assist Level: Minimal Assistance - Patient > 75%    Lower Body Dressing/Undressing Lower body dressing      What is the patient wearing?: Pants     Lower body assist Assist for lower body dressing: Contact Guard/Touching assist     Toileting Toileting    Toileting assist Assist for  toileting: Contact Guard/Touching assist     Transfers Chair/bed transfer  Transfers assist     Chair/bed transfer assist level: Supervision/Verbal cueing Chair/bed transfer assistive device: Geologist, engineeringWalker   Locomotion Ambulation   Ambulation assist      Assist level: Minimal Assistance - Patient > 75% Assistive device: Walker-rolling Max distance: 200   Walk  10 feet activity   Assist     Assist level: Contact Guard/Touching assist Assistive device: Walker-rolling   Walk 50 feet activity   Assist Walk 50 feet with 2 turns activity did not occur: Safety/medical concerns (fatigue, weakness, decreased balance/postural control)  Assist level: Contact Guard/Touching assist Assistive device: Walker-rolling    Walk 150 feet activity   Assist Walk 150 feet activity did not occur: Safety/medical concerns (fatigue, weakness, decreased balance/postural control)  Assist level: Minimal Assistance - Patient > 75% Assistive device: Walker-rolling    Walk 10 feet on uneven surface  activity   Assist Walk 10 feet on uneven surfaces activity did not occur: Safety/medical concerns (fatigue, weakness, decreased balance/postural control, poor safety awareness)         Wheelchair     Assist Will patient use wheelchair at discharge?: Yes Type of Wheelchair: Manual Wheelchair activity did not occur: Refused  Wheelchair assist level: Moderate Assistance - Patient 50 - 74% Max wheelchair distance: 150    Wheelchair 50 feet with 2 turns activity    Assist        Assist Level: Moderate Assistance - Patient 50 - 74%   Wheelchair 150 feet activity     Assist      Assist Level: Moderate Assistance - Patient 50 - 74%   Blood pressure 114/69, pulse 98, temperature 98.4 F (36.9 C), temperature source Oral, resp. rate 16, height 5\' 4"  (1.626 m), weight 68.1 kg, SpO2 95 %.  Medical Problem List and Plan: 1.  Decreased functional mobility secondary to acute  hypoxic hypercapnic respiratory failure with aspiration/ventilatory support pneumonia  Continue CIR---on track for [facility]  2.  Antithrombotics: -DVT/anticoagulation: Subcutaneous heparin             -antiplatelet therapy: N/A 3. Pain Management: Denies pain. Not using oxycodone- will d/c 4. Mood: no SSRIs             -antipsychotic agents: Seroquel 400 mg twice daily 5. Neuropsych: This patient is not capable of making decisions on his own behalf. 6. Skin/Wound Care: Routine skin checks 7. Fluids/Electrolytes/Nutrition:  Prealbumin 34  -dc'ed TF and H20 flushes  -recheck labs Monday  -pt eating very well 8.  Paraesophageal hernia/massively dilated esophagus.  Status post hernia repair percutaneous gastrostomy tube placement 10/26/2020 per cardiothoracic surgery Dr. 13/02/2020 changed to placement of jejunal arm via indwelling gastrostomy tube 11/07/2020 per interventional radiology.    -Continue Protonix twice daily, MiraLAX twice daily, Colace twice daily.    -remove GJtube as outpt 9.  Iron deficiency anemia.    Hemoglobin 11.0 12/3  Continue to monitor 10.  Schizophrenia/intellectual delay/seizure disorder.  Valproic acid 250 mg twice daily, Lamictal 100 mg daily, seroquel 400mg  bid  Sz free since admission to CIR-11/28 11.  Hypothyroidism.  Synthroid 12.  Type 2 diabetes mellitus with hyperglycemia  .  Hemoglobin A1c 5.8.  SSI.  Continue SSI  12/3 change SSI to coincide with meals 13.  Hyperlipidemia.  Crestor 14.  History of CVA with left-sided residual weakness continue therapies as directed 15.  Dysphagia  HS TF as above  CT angio chest personally reviewed-esophagus looks relatively stable, esophagram unremarkable for leaks  -D2/thin diet recommended after MBS 11/30 16.  Hyponatremia  Sodium 134 12/3 17. Cards: tachycardia ---resoved  -EKG12/3  with LVH, sinus tachy  -troponins normal x 2  -repeat 2D echo per cardiology--really unremarkable  12/5 spoke with  cardiology yesterday who will just follow at a distance  LOS: 12  days A FACE TO FACE EVALUATION WAS PERFORMED  Ranelle Oyster 11/27/2020, 10:15 AM

## 2020-11-28 ENCOUNTER — Inpatient Hospital Stay (HOSPITAL_COMMUNITY): Payer: Medicare Other

## 2020-11-28 ENCOUNTER — Inpatient Hospital Stay (HOSPITAL_COMMUNITY): Payer: Medicare Other | Admitting: Speech Pathology

## 2020-11-28 DIAGNOSIS — E871 Hypo-osmolality and hyponatremia: Secondary | ICD-10-CM | POA: Diagnosis not present

## 2020-11-28 DIAGNOSIS — R1319 Other dysphagia: Secondary | ICD-10-CM | POA: Diagnosis not present

## 2020-11-28 DIAGNOSIS — Z931 Gastrostomy status: Secondary | ICD-10-CM | POA: Diagnosis not present

## 2020-11-28 DIAGNOSIS — R5381 Other malaise: Secondary | ICD-10-CM | POA: Diagnosis not present

## 2020-11-28 LAB — GLUCOSE, CAPILLARY
Glucose-Capillary: 106 mg/dL — ABNORMAL HIGH (ref 70–99)
Glucose-Capillary: 124 mg/dL — ABNORMAL HIGH (ref 70–99)
Glucose-Capillary: 126 mg/dL — ABNORMAL HIGH (ref 70–99)

## 2020-11-28 NOTE — Progress Notes (Signed)
Per therapy patient HR was elevated during therapy session. Patient HR went down to 109 when he was resting in bed. We continue to monitor

## 2020-11-28 NOTE — Progress Notes (Signed)
Patient ID: [name], male   DOB: March 24, 1956, 64 y.o.   MRN: [id number]  SW called to follow up with Tabitha RN with [facility] [phone]) to confirm meeting today at 12:30pm. SW informed she is not in the office today. SW requested to speak with administrator Velna Hatchet. SW left message for Sedonia Small inquiring about the meeting and if it will occur today. SW called pt sister [name] [phone]) to inform on above, and unsure if meeting will occur today. She intends to explore further, and will follow-up with SW.  *Conference call at 12:30 pm. Present: [facility] staff- Education administrator at group home, Research scientist (physical sciences) at [employer], [employer]- [facility] group [facility] Production designer, theatre/television/film, Vicki Mallet -Regional VP at [employer], and [name] (pt sister). Purpose was to discuss pt discharge plan and currnet care needs. Confirms that LOC was submitted on 11/23/2020, however, the estimated LOC date for follow-up is 12/18. Reports [facility] can take anywhere from 7-14 business days to authorize. [facility] is not able to accept pt back into program without authorized LOC. Tabitha to follow-up with SW about [facility] staff that will be able to come in for family education. Would like to know if a lift chair is recommended for pt while in program, and benefits. [facility] plans to install a wooden beam to patient's bed to ensure he is able to remain on a 30 degree angle. SW informed will follow-up with Tabitha and pt sister [name] tomorrow after team conference, to update on if we can support pt remaining here through 12/14-12/21, pending when LOC is effective. [facility] reports they check the online portal daily to see when it becomes approved.   Cecile Sheerer, MSW, LCSWA Office: [phone] Cell: [phone] Fax: [phone]

## 2020-11-28 NOTE — Progress Notes (Signed)
[employer] PHYSICAL MEDICINE & REHABILITATION PROGRESS NOTE   Subjective/Complaints: Patient seen sitting up in a chair working with therapy this morning.  He states he slept well overnight.  ROS: Limited due to cognition, but patient denies CP, shortness of breath, nausea, vomiting, diarrhea.   Objective:   ECHOCARDIOGRAM LIMITED  Result Date: 11/26/2020    ECHOCARDIOGRAM LIMITED REPORT   Patient Name:   DRAGO HAMMONDS Date of Exam: 11/26/2020 Medical Rec #:  [id number]            Height:       64.0 in Accession #:    [id number]           Weight:       150.8 lb Date of Birth:  [birth year]           BSA:          1.735 m Patient Age:    64 years             BP:           116/74 mmHg Patient Gender: M                    HR:           107 bpm. Exam Location:  Inpatient Procedure: 2D Echo Indications:    abnormal ECG 794.31  History:        Patient has prior history of Echocardiogram examinations, most                 recent 10/21/2020.  Sonographer:    Delcie Roch Referring Phys: [id number] HEATHER E PEMBERTON IMPRESSIONS  1. Left ventricular ejection fraction, by estimation, is 65 to 70%. The left ventricle has normal function. There is mild left ventricular hypertrophy. Left ventricular diastolic parameters were normal.  2. Right ventricular systolic function is normal. The right ventricular size is normal. Tricuspid regurgitation signal is inadequate for assessing PA pressure.  3. The mitral valve is normal in structure. No evidence of mitral valve regurgitation.  4. The aortic valve is tricuspid. Aortic valve regurgitation is not visualized. No aortic stenosis is present.  5. The inferior vena cava is normal in size with greater than 50% respiratory variability, suggesting right atrial pressure of 3 mmHg. FINDINGS  Left Ventricle: Left ventricular ejection fraction, by estimation, is 65 to 70%. The left ventricle has normal function. The left ventricular internal cavity size was normal in  size. There is mild left ventricular hypertrophy. Left ventricular diastolic  parameters were normal. Right Ventricle: The right ventricular size is normal. Right vetricular wall thickness was not assessed. Right ventricular systolic function is normal. Tricuspid regurgitation signal is inadequate for assessing PA pressure. Pericardium: There is no evidence of pericardial effusion. Mitral Valve: The mitral valve is normal in structure. Aortic Valve: The aortic valve is tricuspid. Aortic valve regurgitation is not visualized. No aortic stenosis is present. Pulmonic Valve: The pulmonic valve was not well visualized. Pulmonic valve regurgitation is not visualized. Aorta: The aortic root is normal in size and structure. Venous: The inferior vena cava is normal in size with greater than 50% respiratory variability, suggesting right atrial pressure of 3 mmHg. IAS/Shunts: The interatrial septum was not well visualized. LEFT VENTRICLE PLAX 2D LVIDd:         3.60 cm  Diastology LVIDs:         2.20 cm  LV e' medial:    8.05 cm/s LV PW:  1.00 cm  LV E/e' medial:  8.6 LV IVS:        0.80 cm  LV e' lateral:   9.68 cm/s LVOT diam:     1.70 cm  LV E/e' lateral: 7.1 LVOT Area:     2.27 cm  IVC IVC diam: 1.70 cm LEFT ATRIUM         Index LA diam:    3.10 cm 1.79 cm/m   AORTA Ao Root diam: 2.70 cm MITRAL VALVE MV Area (PHT): 3.99 cm    SHUNTS MV Decel Time: 190 msec    Systemic Diam: 1.70 cm MV E velocity: 69.00 cm/s MV A velocity: 93.00 cm/s MV E/A ratio:  0.74 Epifanio Lesches MD Electronically signed by Epifanio Lesches MD Signature Date/Time: 11/26/2020/5:15:03 PM    Final    No results for input(s): WBC, HGB, HCT, PLT in the last 72 hours. No results for input(s): NA, K, CL, CO2, GLUCOSE, BUN, CREATININE, CALCIUM in the last 72 hours.  Intake/Output Summary (Last 24 hours) at 11/28/2020 1051 Last data filed at 11/28/2020 0700 Gross per 24 hour  Intake 740 ml  Output 250 ml  Net 490 ml   Physical  Exam: Vital Signs Blood pressure 114/64, pulse 93, temperature 98.6 F (37 C), temperature source Oral, resp. rate 18, height 5\' 4"  (1.626 m), weight 66.7 kg, SpO2 97 %.  Constitutional: No distress . Vital signs reviewed. HENT: Normocephalic.  Atraumatic. Eyes: EOMI. No discharge. Cardiovascular: No JVD.  RRR. Respiratory: Normal effort.  No stridor.  Bilateral clear to auscultation. GI: Non-distended.  BS +.  + PEG. Skin: Warm and dry.  Intact.  PEG site CDI. Psych: Normal mood.  Normal behavior. Musc: No edema in extremities.  No tenderness in extremities. Neuro: Alert Dysarthria, unchanged  Motor: RUE/RLE: 4+/5 proximal distal LUE: 3+-4-/5 proximal to distal with apraxia LLE: 4/5 hip flexion, knee extension, 1/5 ankle dorsiflexion--stable  Assessment/Plan: 1. Functional deficits which require 3+ hours per day of interdisciplinary therapy in a comprehensive inpatient rehab setting.  Physiatrist is providing close team supervision and 24 hour management of active medical problems listed below.  Physiatrist and rehab team continue to assess barriers to discharge/monitor patient progress toward functional and medical goals  Care Tool:  Bathing    Body parts bathed by patient: Left arm, Chest, Abdomen, Front perineal area, Right upper leg, Left upper leg, Face, Buttocks, Right lower leg, Left lower leg, Right arm   Body parts bathed by helper: Right arm, Left arm, Buttocks, Left upper leg, Right lower leg, Right upper leg, Left lower leg     Bathing assist Assist Level: Contact Guard/Touching assist     Upper Body Dressing/Undressing Upper body dressing   What is the patient wearing?: Pull over shirt    Upper body assist Assist Level: Minimal Assistance - Patient > 75%    Lower Body Dressing/Undressing Lower body dressing      What is the patient wearing?: Pants     Lower body assist Assist for lower body dressing: Contact Guard/Touching assist      Toileting Toileting    Toileting assist Assist for toileting: Contact Guard/Touching assist     Transfers Chair/bed transfer  Transfers assist     Chair/bed transfer assist level: Supervision/Verbal cueing Chair/bed transfer assistive device:   Ambulation assist      Assist level: Minimal Assistance - Patient > 75% Assistive device: Walker-rolling Max distance: 200   Walk 10 feet activity  Assist     Assist level: Contact Guard/Touching assist Assistive device: Walker-rolling   Walk 50 feet activity   Assist Walk 50 feet with 2 turns activity did not occur: Safety/medical concerns (fatigue, weakness, decreased balance/postural control)  Assist level: Contact Guard/Touching assist Assistive device: Walker-rolling    Walk 150 feet activity   Assist Walk 150 feet activity did not occur: Safety/medical concerns (fatigue, weakness, decreased balance/postural control)  Assist level: Minimal Assistance - Patient > 75% Assistive device: Walker-rolling    Walk 10 feet on uneven surface  activity   Assist Walk 10 feet on uneven surfaces activity did not occur: Safety/medical concerns (fatigue, weakness, decreased balance/postural control, poor safety awareness)         Wheelchair     Assist Will patient use wheelchair at discharge?: Yes Type of Wheelchair: Manual Wheelchair activity did not occur: Refused  Wheelchair assist level: Moderate Assistance - Patient 50 - 74% Max wheelchair distance: 150    Wheelchair 50 feet with 2 turns activity    Assist        Assist Level: Moderate Assistance - Patient 50 - 74%   Wheelchair 150 feet activity     Assist      Assist Level: Moderate Assistance - Patient 50 - 74%   Blood pressure 114/64, pulse 93, temperature 98.6 F (37 C), temperature source Oral, resp. rate 18, height 5\' 4"  (1.626 m), weight 66.7 kg, SpO2 97 %.  Medical Problem List and  Plan: 1.  Decreased functional mobility secondary to acute hypoxic hypercapnic respiratory failure with aspiration/ventilatory support pneumonia  Continue CIR 2.  Antithrombotics: -DVT/anticoagulation: Subcutaneous heparin             -antiplatelet therapy: N/A 3. Pain Management: Denies pain. Not using oxycodone- will d/c 4. Mood: no SSRIs             -antipsychotic agents: Seroquel 400 mg twice daily 5. Neuropsych: This patient is not capable of making decisions on his own behalf. 6. Skin/Wound Care: Routine skin checks 7. Fluids/Electrolytes/Nutrition:  Prealbumin 34  -dc'ed TF and H20 flushes  100% p.o. intake 8.  Paraesophageal hernia/massively dilated esophagus.  Status post hernia repair percutaneous gastrostomy tube placement 10/26/2020 per cardiothoracic surgery Dr. 13/02/2020 changed to placement of jejunal arm via indwelling gastrostomy tube 11/07/2020 per interventional radiology.    -Continue Protonix twice daily, MiraLAX twice daily, Colace twice daily.    -remove GJtube as outpt 9.  Iron deficiency anemia.    Hemoglobin 11.0 on 12/3  Continue to monitor 10.  Schizophrenia/intellectual delay/seizure disorder.  Valproic acid 250 mg twice daily, Lamictal 100 mg daily, seroquel 400mg  bid  Sz free since admission to CIR-12/6 11.  Hypothyroidism.  Synthroid 12.  Type 2 diabetes mellitus with hyperglycemia  .  Hemoglobin A1c 5.8.  SSI.  Continue SSI  Change SSI to coincide with meals  CBGs ordered, elevated on last check 13.  Hyperlipidemia.  Crestor 14.  History of CVA with left-sided residual weakness continue therapies as directed 15.  Dysphagia  HS TF as above  CT angio chest personally reviewed-esophagus looks relatively stable, esophagram unremarkable for leaks  -D2 thins diet recommended after MBS 11/30 16.  Hyponatremia  Sodium 134 12/3 17. Cards: tachycardia --- resolved  -EKG12/3  with LVH, sinus tachy  -troponins normal x 2  -repeat 2D echo per  cardiology--really unremarkable  LOS: 13 days A FACE TO FACE EVALUATION WAS PERFORMED  Angellica Maddison 12/30 11/28/2020, 10:51 AM

## 2020-11-28 NOTE — Progress Notes (Signed)
Occupational Therapy Session Note  Patient Details  Name: [name] MRN: [id number] Date of Birth: 07-24-1956  Today's Date: 11/28/2020 OT Individual Time: 1100-1157 OT Individual Time Calculation (min): 57 min    Short Term Goals: Week 2:  OT Short Term Goal 1 (Week 2): STG=LTG d/t ELOS  Skilled Therapeutic Interventions/Progress Updates:    Pt received sitting in the w/c with no c/o pain, urgently stating he needs to use the bathroom. He completed ambulatory transfer with CGA- very fast and impulsive movements. Pt voided urine in standing with CGA, managing clothing. Pt returned to his w/c and doffed all clothing in prep for shower. Mod cueing to slow down. Pt transferred into shower and completed all bathing with close supervision- CGA overall. He required min A to dry off and cueing for slowing down. Pt donned shirt with min A and pants with mod A- more difficulty threading over LE today, likely d/t very fast pace and low frustration tolerance. Pt completed 150 ft of functional mobility to the therapy gym with CGA- one LOB from L foot catching, with min A to correct. Agility ladder used as external cue for pt to slow down and concentrate on stepping more intentionally for fall prevention, 4x repetitions. Pt completed Nustep for reciprocal stepping with BUE and BLE- pt required mod cueing and min facilitation for LUE grasp on the handle and L foot to remain flat on the pedal. Pt returned to his room and was left sitting up in the w/c with all needs met, chair alarm set.   Therapy Documentation Precautions:  Precautions Precautions: Fall Precaution Comments: L hemiparesis, PEG Restrictions Weight Bearing Restrictions: No  Therapy/Group: Individual Therapy  Curtis Sites 11/28/2020, 12:51 PM

## 2020-11-28 NOTE — Progress Notes (Signed)
Physical Therapy Session Note  Patient Details  Name: [name] MRN: [id number] Date of Birth: 10-08-1956  Today's Date: 11/28/2020 PT Individual Time: [phone] and 1400-1455 PT Individual Time Calculation (min): 60 min and 55 min  Short Term Goals: Week 2:  PT Short Term Goal 1 (Week 2): Patient will perform basic transfers with supervision consistently. PT Short Term Goal 2 (Week 2): Patient will ambulate >150 ft with close supervision for safety using LRAD. PT Short Term Goal 3 (Week 2): Patient will perform dynamic balance activities in standing >10 min.  Skilled Therapeutic Interventions/Progress Updates:     Session 1: Patient in w/c with RN in the room providing morning meds upon PT arrival. Patient alert and agreeable to PT session. Patient denied pain during session.  Therapeutic Activity: Transfers: Patient performed sit to/from stand with supervision throughout session without AD. Provided verbal cues for controlled descent for safety due to impulsivity.  Gait Training:  Patient ambulated >150 feet x2 without AD with CGA and min A x1 for minor LOB due to L toe catching. Ambulated with increased R trunk lean and intermittent circumduction on L to compensate for decreased L hip/knee flexion in swing. Provided verbal cues for erect posture, looking ahead, decreased gait speed for controlled stepping, and increased hamstring activation in pre-swing for increased foot clearance without compensatory strategies 6 Min Walk Test:  Instructed patient to ambulate as quickling and as safely as possible for 6 minutes using LRAD. Patient was allowed to take standing rest breaks without stopping the test, but if he required a sitting rest break the clock would be stopped and the test would be over.  Results: 444 feet with CGA without AD, stopped clock at 3 min 11 sec due to patient requesting sitting rest break, reporting moderate fatigeu (5-6/10 RPE)   Neuromuscular  Re-ed: Patient performed the following L upper/lower extremity motor control activities with multimodal cues for correct muscle activation: -clasping 2 yellow and 2 red large clothespins to basket ball net in lowest setting in sitting to focus on pincer grasp, required significant time to complete task due to poor motor control of L hand, cues for patient to use hand-over-hand assist from R hand to complete task -dunked green ball into basket ball goal x4 min in lowest and highest setting encouraging incorporation of L hand and increased L shoulder flexion and elbow extension with task, patient retieved the ball from bounce back or from the floor with CGA for balance throughout -stepping over 5 cones 2 ft apart with L foot performing step-to pattern x4, CGA-min A for balance -weaving through 5 cones 2 feet apart x4 (hit 1 cone throughout, demonstrating good control on turns with decreased gait speed for safety), CGA for balance -step-taps to second step on 6" steps 2x10 with red dot as visual target, cleared step each time, increased trunk flexion with fatigue, provided multimodal cues for increased hamstring activation and decreased trunk flexion throughout, CGA for balance with B upper extremities on B rails for support  Patient in w/c in the room at end of session with breaks locked, seat belt alarm set, and all needs within reach.   Session 2: Patient in w/c in the room upon PT arrival. Patient alert and agreeable to PT session. Patient denied pain during session. Patient ambulated to the door with CGA when NT asked to assess patient's vitals. Patient returned to sitting in the w/c. BP 134/108, HR 122, manual HR 129. Provided prolonged seated rest break >4 min  providing cues for diaphragmatic breathing and relaxation. HR remained in the 120's seated at rest, BP 136/79. RN made aware and encouraged therapy in the room while monitoring HR.   Patient performed seated prolonged stretching of L upper  extremity and hand and L gastroc and hamstring for reduced flexor tone. Performed seated L finger flexion/extension, eblow flexion/extension, reaching with L hand to visual targets, L knee flexion/extension and B seated heel/toe raises. HR 132 with seated activity, recovered to 122 <2 min with seated rest. Patient stood and HR increased to 139 bpm and elevated to 142 with standing marching. Patient requested to go to the bathroom. He was continent of bowl and bladder during toileting. Performed peri-care with set-up assist and lower body clothing management with min A on L. Patient returned to lying in the bed. HR 144 after ambulating from the bathroom, required >2 min to recover to 122 and maintained 120's in lying >2 min. RN made aware of elevated HR at rest and with activity this session.   Patient in bed at end of session with breaks locked, bed alarm set, and all needs within reach.   Therapy Documentation Precautions:  Precautions Precautions: Fall Precaution Comments: L hemiparesis, PEG Restrictions Weight Bearing Restrictions: No   Therapy/Group: Individual Therapy  Shataria Crist L Dian Minahan PT, DPT  11/28/2020, 11:50 AM

## 2020-11-28 NOTE — Progress Notes (Signed)
Speech Language Pathology Daily Session Note  Patient Details  Name: BENUEL LY MRN: [id number] Date of Birth: 01/29/1956  Today's Date: 11/28/2020 SLP Individual Time: 0370-4888 SLP Individual Time Calculation (min): 25 min  Short Term Goals: Week 2: SLP Short Term Goal 1 (Week 2): STGs=LTGs due to ELOS  Skilled Therapeutic Interventions: Skilled treatment session focused on dysphagia goals. SLP facilitated session by providing trials of Dys. 3 textures. Patient required Mod verbal cues for small bites and a slow rate of self-feeding without overt s/s of aspiration noted. One overt cough present with thin liquids via straw, suspect due to a large bolus. Spoke with patient's sister on the phone who reported she would like patient to stay on his current diet to lack of dentition but discussed advancing diet if/when dentures are available. Patient left upright in the wheelchair with alarm on and all needs within reach. Continue with current plan of care.      Pain No/Denies Pain   Therapy/Group: Individual Therapy  Niclas Markell 11/28/2020, 12:43 PM

## 2020-11-28 NOTE — Progress Notes (Signed)
Physical Therapy Note  Patient Details  Name: [name] MRN: [id number] Date of Birth: [birth year] Today's Date: 11/28/2020   Patient's HR elevated during PT session from 1400-1455, see progress note for details. Resting HR 120's at rest, 130's with seated exercise, and 140's with standing or short ambulation. Recovered to 120's in lying >4 min. RN made aware.    Cherie L Grunenberg PT, DPT  11/28/2020, 4:11 PM

## 2020-11-29 ENCOUNTER — Inpatient Hospital Stay (HOSPITAL_COMMUNITY): Payer: Medicare Other

## 2020-11-29 ENCOUNTER — Inpatient Hospital Stay (HOSPITAL_COMMUNITY): Payer: Medicare Other | Admitting: Speech Pathology

## 2020-11-29 ENCOUNTER — Inpatient Hospital Stay (HOSPITAL_COMMUNITY): Payer: Medicare Other | Admitting: Occupational Therapy

## 2020-11-29 DIAGNOSIS — R5381 Other malaise: Secondary | ICD-10-CM | POA: Diagnosis not present

## 2020-11-29 DIAGNOSIS — E871 Hypo-osmolality and hyponatremia: Secondary | ICD-10-CM | POA: Diagnosis not present

## 2020-11-29 DIAGNOSIS — R569 Unspecified convulsions: Secondary | ICD-10-CM | POA: Diagnosis not present

## 2020-11-29 DIAGNOSIS — R1319 Other dysphagia: Secondary | ICD-10-CM | POA: Diagnosis not present

## 2020-11-29 DIAGNOSIS — R000 Tachycardia, unspecified: Secondary | ICD-10-CM

## 2020-11-29 LAB — GLUCOSE, CAPILLARY
Glucose-Capillary: 132 mg/dL — ABNORMAL HIGH (ref 70–99)
Glucose-Capillary: 125 mg/dL — ABNORMAL HIGH (ref 70–99)
Glucose-Capillary: 146 mg/dL — ABNORMAL HIGH (ref 70–99)

## 2020-11-29 MED ORDER — METOPROLOL TARTRATE 12.5 MG HALF TABLET
12.5000 mg | ORAL_TABLET | Freq: Two times a day (BID) | ORAL | Status: DC
  Administered 2020-11-29 – 2020-12-01 (×5): 12.5 mg via ORAL
  Filled 2020-11-29 (×5): qty 1

## 2020-11-29 NOTE — Progress Notes (Signed)
[facility] PHYSICAL MEDICINE & REHABILITATION PROGRESS NOTE   Subjective/Complaints: Lying in bed resting. No new complaints. Eating well. No pain.   ROS: Limited due to cognitive/behavioral    Objective:   No results found. No results for input(s): WBC, HGB, HCT, PLT in the last 72 hours. No results for input(s): NA, K, CL, CO2, GLUCOSE, BUN, CREATININE, CALCIUM in the last 72 hours.  Intake/Output Summary (Last 24 hours) at 11/29/2020 1012 Last data filed at 11/29/2020 0810 Gross per 24 hour  Intake 757 ml  Output --  Net 757 ml   Physical Exam: Vital Signs Blood pressure 114/78, pulse 87, temperature 98.1 F (36.7 C), temperature source Oral, resp. rate 18, height 5\' 4"  (1.626 m), weight 65.9 kg, SpO2 95 %.  Constitutional: No distress . Vital signs reviewed. HEENT: EOMI, oral membranes moist Neck: supple Cardiovascular: RRR without murmur. No JVD    Respiratory/Chest: CTA Bilaterally without wheezes or rales. Normal effort    GI/Abdomen: BS +, non-tender, non-distended, PEG clean, dry Ext: no clubbing, cyanosis, or edema Psych: pleasant and cooperative Musc: No edema in extremities.  No tenderness in extremities. Neuro: Alert Dysarthric, cooperative  Motor: RUE/RLE: 4+/5 proximal distal LUE: 3+ to 4-/5 proximal to distal with apraxia LLE: 4/5 hip flexion, knee extension, 1/5 ankle dorsiflexion--stable  Assessment/Plan: 1. Functional deficits which require 3+ hours per day of interdisciplinary therapy in a comprehensive inpatient rehab setting.  Physiatrist is providing close team supervision and 24 hour management of active medical problems listed below.  Physiatrist and rehab team continue to assess barriers to discharge/monitor patient progress toward functional and medical goals  Care Tool:  Bathing    Body parts bathed by patient: Left arm, Chest, Abdomen, Front perineal area, Right upper leg, Left upper leg, Face, Buttocks, Right lower leg, Left lower leg,  Right arm   Body parts bathed by helper: Right arm, Left arm, Buttocks, Left upper leg, Right lower leg, Right upper leg, Left lower leg     Bathing assist Assist Level: Contact Guard/Touching assist     Upper Body Dressing/Undressing Upper body dressing   What is the patient wearing?: Pull over shirt    Upper body assist Assist Level: Minimal Assistance - Patient > 75%    Lower Body Dressing/Undressing Lower body dressing      What is the patient wearing?: Pants     Lower body assist Assist for lower body dressing: Contact Guard/Touching assist     Toileting Toileting    Toileting assist Assist for toileting: Contact Guard/Touching assist     Transfers Chair/bed transfer  Transfers assist     Chair/bed transfer assist level: Supervision/Verbal cueing Chair/bed transfer assistive device:   Ambulation assist      Assist level: Minimal Assistance - Patient > 75% Assistive device: Walker-rolling Max distance: 200   Walk 10 feet activity   Assist     Assist level: Contact Guard/Touching assist Assistive device: Walker-rolling   Walk 50 feet activity   Assist Walk 50 feet with 2 turns activity did not occur: Safety/medical concerns (fatigue, weakness, decreased balance/postural control)  Assist level: Contact Guard/Touching assist Assistive device: Walker-rolling    Walk 150 feet activity   Assist Walk 150 feet activity did not occur: Safety/medical concerns (fatigue, weakness, decreased balance/postural control)  Assist level: Minimal Assistance - Patient > 75% Assistive device: Walker-rolling    Walk 10 feet on uneven surface  activity   Assist Walk 10 feet on uneven surfaces  activity did not occur: Safety/medical concerns (fatigue, weakness, decreased balance/postural control, poor safety awareness)         Wheelchair     Assist Will patient use wheelchair at discharge?: Yes Type of Wheelchair:  Manual Wheelchair activity did not occur: Refused  Wheelchair assist level: Moderate Assistance - Patient 50 - 74% Max wheelchair distance: 150    Wheelchair 50 feet with 2 turns activity    Assist        Assist Level: Moderate Assistance - Patient 50 - 74%   Wheelchair 150 feet activity     Assist      Assist Level: Moderate Assistance - Patient 50 - 74%   Blood pressure 114/78, pulse 87, temperature 98.1 F (36.7 C), temperature source Oral, resp. rate 18, height 5\' 4"  (1.626 m), weight 65.9 kg, SpO2 95 %.  Medical Problem List and Plan: 1.  Decreased functional mobility secondary to acute hypoxic hypercapnic respiratory failure with aspiration/ventilatory support pneumonia  Continue CIR 2.  Antithrombotics: -DVT/anticoagulation: Subcutaneous heparin             -antiplatelet therapy: N/A 3. Pain Management: Denies pain. Not using oxycodone- will d/c 4. Mood: no SSRIs             -antipsychotic agents: Seroquel 400 mg twice daily 5. Neuropsych: This patient is not capable of making decisions on his own behalf. 6. Skin/Wound Care: Routine skin checks 7. Fluids/Electrolytes/Nutrition:  Prealbumin 34  -dc'ed TF and H20 flushes  100% p.o. intake 8.  Paraesophageal hernia/massively dilated esophagus.  Status post hernia repair percutaneous gastrostomy tube placement 10/26/2020 per cardiothoracic surgery Dr. 13/02/2020 changed to placement of jejunal arm via indwelling gastrostomy tube 11/07/2020 per interventional radiology.    -Continue Protonix twice daily, MiraLAX twice daily, Colace twice daily.    -remove GJtube as outpt 9.  Iron deficiency anemia.    Hemoglobin 11.0 on 12/3  Continue to monitor 10.  Schizophrenia/intellectual delay/seizure disorder.  Valproic acid 250 mg twice daily, Lamictal 100 mg daily, seroquel 400mg  bid  Sz free since admission to CIR-12/6 11.  Hypothyroidism.  Synthroid 12.  Type 2 diabetes mellitus with hyperglycemia  .  Hemoglobin A1c  5.8.  SSI.  Continue SSI  Change SSI to coincide with meals  CBGs controlled 13.  Hyperlipidemia.  Crestor 14.  History of CVA with left-sided residual weakness continue therapies as directed 15.  Dysphagia  HS TF as above  CT angio chest personally reviewed-esophagus looks relatively stable, esophagram unremarkable for leaks  -D2 thins diet recommended after MBS 11/30 16.  Hyponatremia  Sodium 134 12/3 17. Cards: tachycardia --- resolved  -EKG12/3  with LVH, sinus tachy  -troponins normal x 2  -repeat 2D echo per cardiology--really unremarkable  -tachy with activity, 120's today---add low dose lopressor LOS: 14 days A FACE TO FACE EVALUATION WAS PERFORMED  12/30 11/29/2020, 10:12 AM

## 2020-11-29 NOTE — Progress Notes (Signed)
Physical Therapy Weekly Progress Note  Patient Details  Name: [name] MRN: [id number] Date of Birth: 10-Jul-1956  Beginning of progress report period: November 23, 2020 End of progress report period: November 29, 2020  Today's Date: 11/29/2020 PT Individual Time: 0900-0950 PT Individual Time Calculation: 50 min  Patient has met 1 of 3 short term goals.  Patient demonstrated adequate progress this week, limited some by fatigue and increased HR with activity at the end of the week. He currently requires supervision for bed mobility and transfers, CGA gait >44f without AD, supervision >200 ft with RW with L hand splint (demonstrates poor safety awareness with RW), and min A-CGA 4 steps with B rails.  Patient continues to demonstrate the following deficits muscle weakness and muscle joint tightness, decreased cardiorespiratoy endurance, abnormal tone and decreased coordination, decreased attention to left and decreased standing balance, decreased postural control, hemiplegia and decreased balance strategies and therefore will continue to benefit from skilled PT intervention to increase functional independence with mobility.  Patient progressing toward long term goals.  Continue plan of care.  PT Short Term Goals Week 2:  PT Short Term Goal 1 (Week 2): Patient will perform basic transfers with supervision consistently. PT Short Term Goal 1 - Progress (Week 2): Met PT Short Term Goal 2 (Week 2): Patient will ambulate >150 ft with close supervision for safety using LRAD. PT Short Term Goal 2 - Progress (Week 2): Progressing toward goal PT Short Term Goal 3 (Week 2): Patient will perform dynamic balance activities in standing >10 min. PT Short Term Goal 3 - Progress (Week 2): Progressing toward goal Week 3:  PT Short Term Goal 1 (Week 3): Patient will perform dynamic balance activities in standing >10 min. PT Short Term Goal 2 (Week 3): Patient will ambulate >150 ft with close  supervision for safety using LRAD. PT Short Term Goal 3 (Week 3): Patient will improve his score on the Berg Balance Scale to >36/56 to indicate reduced fall risk with standing activities.  Skilled Therapeutic Interventions/Progress Updates:     Patient in bed upon PT arrival. Patient alert and agreeable to PT session. Patient denied pain during session. HR 111 bpm at rest at beginning of session.   Therapeutic Activity: Bed Mobility: Patient performed supine to/from sit with mod I with min use of bed rail. Provided verbal cues for not using the bed rail to simulate home set-up. Transfers: Patient performed sit to/from stand with supervision throughout session. He performed a toilet transfer with supervision, peri-care with mod I for increased time due to decreased functional use of L upper extremity and min A for lower body clothing management on the L. Provided verbal cues for safety awareness with impulsivity with movement. Patient required increased time for toileting. Patient asking for privacy during toileting. Patient missed 10 min of skilled PT due to toileting, RN made aware. Will attempt to make-up missed time as able.    Gait Training:  Patient ambulated >250 feet using RW with L hand splint with close supervision with L toe catching x1 and patient able to self correct minor LOB. He then ambulated >250 feet without an AD holding a box 50% of the time for dual task challenge. Ambulated with increased R trunk lean and intermittent circumduction on L to compensate for decreased L hip/knee flexion in swing, deficits more prominent without AD, however demonstrated poor safety awareness with RW, hitting it with his L foot x4 and pushing it far ahead and away when going  to sit down. Provided verbal cues for erect posture, looking ahead, decreased gait speed for controlled stepping, increased hamstring activation in pre-swing for increased foot clearance without compensatory strategies, and closer  proximity and safety with RW.  Neuromuscular Re-ed: Patient performed the following standing balance and left upper extremity motor control activities: -standing performing large peg board task, completed basic level picture, unable to complete medium level challenge with max A, stood 6 min 20 sec with close supervision during task, limited by increased fatigue in standing, HR 140bpm after -sitting performed open and closing L hand with cues for full finger extension, often performs with R hand at the same time -forward punches with L arm 2x10 focused on 90 degrees of shoulder flexion and full elbow extension with min-mod A to facilitate these goals  Patient in w/c in the room at end of session with breaks locked, seat belt alarm set, and all needs within reach.   Therapy Documentation Precautions:  Precautions Precautions: Fall Precaution Comments: L hemiparesis, PEG Restrictions Weight Bearing Restrictions: No  Therapy/Group: Individual Therapy  Shreeya Recendiz L Hymen Arnett PT, DPT  11/29/2020, 3:39 PM

## 2020-11-29 NOTE — Patient Care Conference (Signed)
Inpatient RehabilitationTeam Conference and Plan of Care Update Date: 11/29/2020   Time: 10:14 AM    Patient Name: [name]      Medical Record Number: [id number]  Date of Birth: 1956-03-31 Sex: Male         Room/Bed: 4W11C/4W11C-01 Payor Info: Payor: MEDICARE / Plan: MEDICARE PART A AND B / Product Type: *No Product type* /    Admit Date/Time:  11/15/2020  3:05 PM  Primary Diagnosis:  Debility  Hospital Problems: Principal Problem:   Debility Active Problems:   Hyponatremia   Dysphagia   Seizures (HCC)   S/P percutaneous endoscopic gastrostomy (PEG) tube placement [facility])   Dilation of esophagus    Expected Discharge Date: Expected Discharge Date: 12/13/20  Team Members Present: Physician leading conference: Dr. Faith Rogue Care Coodinator Present: Kennyth Arnold, RN, BSN, CRRN;Cecile Sheerer, LCSWA Nurse Present: Jesusita Oka, LPN PT Present: Serina Cowper, PT OT Present: Jake Shark, OT SLP Present: Feliberto Gottron, SLP PPS Coordinator present : Edson Snowball, Park Breed, SLP     Current Status/Progress Goal Weekly Team Focus  Bowel/Bladder   pt continent of B/B. LBM 11/27/2020  Continued continence of B/B  Assess B/B every shift and PRN   Swallow/Nutrition/ Hydration   Dys. 2 textures with thin liquids, Min verbal cues for use of strategies  Supervision  increase use of strategies, possible trials of Dys.3 textures if dentures become available   ADL's   CGA transfers, CGA UB ADLs, CGA LB ADLs, impulsive and fast moving  Supervision overall  ADL retraining, ADL transfers, BUE coordination, d/c planning   Mobility   bed mobility supervision, transfers supervision without AD, gait >414ft CGA, 4 steps min A-CGA with B rails  Supervision overall  Balance, activity tolerance, gait and stair training, functional mobility, L upper/lower extremity NMR, L attention, safety awareness, patient/caregiver education   Communication              Safety/Cognition/ Behavioral Observations            Pain   Pt denies pain and has no S/Sx of pain  continued pain rating of 0/10  Assess pain every shift and PRN   Skin   Dark bruising to right and left abdomen and PEG tube site  No new breakdown  Assess skin every shift and PRN     Discharge Planning:  Pt currently lives in [facility] group home independent living with 4 other residents and they have 24/7 supervision. Pt sister [name] is his POA. D/C date pending when LOC is authorized for pt to return to group home. Possibly approved between 12/14-12/21.   Team Discussion: Continent B/B, no pain reported, transfers are fast and not always safe. OT reports patient is contact guard with supervision goals. PT reports patient is mod I for bed mobility with supervision goals. SLP reports patient is on a Dys 2 with thin liquids diet. Patient is impulsive at baseline.  Patient on target to meet rehab goals: yes  *See Care Plan and progress notes for long and short-term goals.   Revisions to Treatment Plan:  Not at this time.  Teaching Needs: Continue family education, group home to send supervisors in for training as well.  Current Barriers to Discharge: Decreased caregiver support, Medical stability, Home enviroment access/layout, Lack of/limited family support and Behavior  Possible Resolutions to Barriers: Continue current medications, continue to work on impulsiveness, provide emotional support for family and patient.     Medical Summary Current Status: now on PO  diet. off TF, PEG in place. HR controlled, no seizures  Barriers to Discharge: Medical stability   Possible Resolutions to [employer] Focus: daily labs, data review. maximize nutrition   Continued Need for Acute Rehabilitation Level of Care: The patient requires daily medical management by a physician with specialized training in physical medicine and rehabilitation for the following reasons: Direction of a  multidisciplinary physical rehabilitation program to maximize functional independence : Yes Medical management of patient stability for increased activity during participation in an intensive rehabilitation regime.: Yes Analysis of laboratory values and/or radiology reports with any subsequent need for medication adjustment and/or medical intervention. : Yes   I attest that I was present, lead the team conference, and concur with the assessment and plan of the team.   Kennyth Arnold G 11/29/2020, 1:15 PM

## 2020-11-29 NOTE — Progress Notes (Signed)
Physical Therapy Note  Patient Details  Name: [name] MRN: [id number] Date of Birth: December 12, 1956 Today's Date: 11/29/2020    Patient in bed upon PT arrival for afternoon therapy session. Patient stated he was tired and going to rest this afternoon. Stated "we can do it tomorrow." Provided increased encouragement and offered several therapeutic options. Patient continued to decline participating in PT this afternoon due to fatigue. Patient in the bed with the breaks locked, bed alarm set, and all needs in reach upon PT departure.   Patient missed 75 min of skilled PT due to fatigue/refusal, RN made aware. Will attempt to make-up missed time as able.     Link Burgeson L Tava Peery PT, DPT  11/29/2020, 2:20 PM

## 2020-11-29 NOTE — Progress Notes (Signed)
SLP Cancellation Note  Patient Details Name: [name] MRN: [id number] DOB: 09-13-1956   Cancelled treatment:       Patient missed 30 minutes of skilled SLP intervention despite 2 attempts by SLP. Patient offered snacks/trials multiple times to work towards diet advancement, however, patient declined X 2 due to fatigue.                                                                                                 [name] 11/29/2020, 12:39 PM

## 2020-11-29 NOTE — Progress Notes (Signed)
Nutrition Follow-up  RD working remotely.  DOCUMENTATION CODES:   Not applicable  INTERVENTION:   - Continue Ensure Enlive po BID, each supplement provides 350 kcal and 20 grams of protein  - Encouraged continued adequate PO intake  NUTRITION DIAGNOSIS:   Inadequate oral intake related to dysphagia as evidenced by NPO status.  Progressing, pt now on dysphagia 2 diet with thin liquids  GOAL:   Patient will meet greater than or equal to 90% of their needs  Progressing  MONITOR:   PO intake, Supplement acceptance, Labs, Weight trends  REASON FOR ASSESSMENT:   Consult Enteral/tube feeding initiation and management  ASSESSMENT:   64 year old male with PMH of iron deficiency anemia, T2DM, CVA, dysphagia, Barrett's esophagus, GERD, gastroparesis, HTN, HLD, intellectual disability residing at a group home, schizophrenia, seizure disorder, and hypothyroidism. Presented 10/20/20 after a fall. CT abdomen pelvis showed a large stable gastric hernia with associated esophageal dilatation and subsequent mass-effect on the trachea. Pt underwent EGD on 10/29 that showed dilation in the entire esophagus. On 10/30, pt developed significant respiratory distress and required intubation. Pt underwent hiatal hernia repair with G-tube placement by cardiothoracic surgery on 11/03. A jejunal arm was added to indwelling G-tube on 11/15 by IR. Pt was extubated 11/13. Pt remains NPO. Admitted to CIR on 11/23.  11/30 - MBS, diet advanced to dysphagia 2 with thin liquids 12/01 - transition to nocturnal TF 12/03 - nocturnal TF d/c  A 48-hour calorie count was completed and pt was found to be meeting at least 100% of his minimum estimated kcal and protein needs. Nocturnal TF were d/c. PEG with J-arm remains in place. Free water flushes of 50 ml q 12 hours are ordered.  Pt continues to eat well with meal completions mostly 100%. Pt accepting ~50% of Ensure Enlive supplements over the last week. Will  continue with current supplement regimen.  CIR admit weight: 69.6 kg Current weight: 66.7 kg  Overall, pt with a 2.9 kg weight loss since admission to CIR. This is a 4.2% weight loss in 2 weeks which is significant for timeframe. However, weight appears to be stabilizing. Will continue to monitor trends.  Meal Completion: 80-100% x last 8 documented meals  Medications reviewed and include: colace, Ensure Enlive BID, protonix, miralax  Labs reviewed. CBG's: 125-146 x 24 hours  Diet Order:   Diet Order            DIET DYS 2 Room service appropriate? Yes; Fluid consistency: Thin  Diet effective now                 EDUCATION NEEDS:   No education needs have been identified at this time  Skin:  Skin Assessment: Skin Integrity Issues: Incisions: abdomen  Last BM:  11/30/20 medium type 4  Height:   Ht Readings from Last 1 Encounters:  11/19/20 5\' 4"  (1.626 m)    Weight:   Wt Readings from Last 1 Encounters:  11/30/20 66.7 kg    Ideal Body Weight:     BMI:  Body mass index is 25.24 kg/m.  Estimated Nutritional Needs:   Kcal:  1900-2100  Protein:  95-110 grams  Fluid:  >/= 1.9 L    14/08/21, MS, RD, LDN Inpatient Clinical Dietitian Please see [facility] for contact information.

## 2020-11-29 NOTE — Progress Notes (Addendum)
Occupational Therapy Session Note  Patient Details  Name: [name] MRN: [id number] Date of Birth: 1956-02-09  Today's Date: 11/29/2020 OT Individual Time: 1258-1330 OT Individual Time Calculation (min): 32 min  Missed 13 minutes of therapy due to refusal, return to bed, fatigue  Short Term Goals: Week 1:  OT Short Term Goal 1 (Week 1): Pt will wait to initiate transfer till after w/c brakes are locked to demo improved safety awareness wiht no VC OT Short Term Goal 1 - Progress (Week 1): Progressing toward goal OT Short Term Goal 2 (Week 1): Pt will transfer to toielt wiht CGA OT Short Term Goal 2 - Progress (Week 1): Met OT Short Term Goal 3 (Week 1): Pt will don LB clothing wiht A only for balance OT Short Term Goal 3 - Progress (Week 1): Met OT Short Term Goal 4 (Week 1): Pt will complete 2/3 steps of toileting OT Short Term Goal 4 - Progress (Week 1): Met OT Short Term Goal 5 (Week 1): Pt will bathe with MIN A and no more than min VC for sequencing/thoroughness OT Short Term Goal 5 - Progress (Week 1): Met  Skilled Therapeutic Interventions/Progress Updates:    patient in bed, alert and responds quickly to getting OOB for shower this afternoon.  He denies pain, dysarthric speech difficult to understand.  He is cooperative but impulsive.  Supine to sitting edge of bed with CGA.  Sit to stand and ambulation without AD to/from bed, toilet and shower bench with CGA and cues for pace.  He is able to doff clothing seated on toilet with CS/CGA.  Shower completed with CS/CGA overall with min A for thoroughness for bilateral arms.  Cues for safety (would walk with wet feet etc.)   Completed dressing seated edge of bed - min A for OH shirt, CGA for seated balance and in stance for underwear, pants, slipper socks and shoes.  He quickly returned to supine position once he finished dressing and declined getting back OOB, stating that he would do it tomorrow.  HR after shower 133, HR after  supine for 4 minutes 122 - nursing aware.  He remained in bed at close of session, bed alarm set and callbell in hand.    Therapy Documentation Precautions:  Precautions Precautions: Fall Precaution Comments: L hemiparesis, PEG Restrictions Weight Bearing Restrictions: No  Therapy/Group: Individual Therapy  Carlos Levering 11/29/2020, 7:45 AM

## 2020-11-29 NOTE — Progress Notes (Signed)
Patient ID: [name], male   DOB: 06-14-1956, 64 y.o.   MRN: [id number]  SW spoke with pt sister [name] [phone]) to provide updates from team conference, gains made and pt being ready for d/c; no lift chair recommended, and currently on D2 thin unless dentures are found. SW to follow-up after speaking with Tabitha/RN with [facility].   Cecile Sheerer, MSW, LCSWA Office: [phone] Cell: [phone] Fax: [phone]

## 2020-11-30 ENCOUNTER — Inpatient Hospital Stay (HOSPITAL_COMMUNITY): Payer: Medicare Other

## 2020-11-30 DIAGNOSIS — R5381 Other malaise: Secondary | ICD-10-CM | POA: Diagnosis not present

## 2020-11-30 DIAGNOSIS — R1319 Other dysphagia: Secondary | ICD-10-CM | POA: Diagnosis not present

## 2020-11-30 DIAGNOSIS — E871 Hypo-osmolality and hyponatremia: Secondary | ICD-10-CM | POA: Diagnosis not present

## 2020-11-30 DIAGNOSIS — R569 Unspecified convulsions: Secondary | ICD-10-CM | POA: Diagnosis not present

## 2020-11-30 LAB — GLUCOSE, CAPILLARY
Glucose-Capillary: 127 mg/dL — ABNORMAL HIGH (ref 70–99)
Glucose-Capillary: 124 mg/dL — ABNORMAL HIGH (ref 70–99)
Glucose-Capillary: 104 mg/dL — ABNORMAL HIGH (ref 70–99)
Glucose-Capillary: 106 mg/dL — ABNORMAL HIGH (ref 70–99)

## 2020-11-30 NOTE — Progress Notes (Signed)
Speech Language Pathology Weekly Progress Note  Patient Details  Name: [name] MRN: [id number] Date of Birth: Sep 24, 1956  Beginning of progress report period: November 23, 2020 End of progress report period: November 30, 2020  Short Term Goals: Week 2: SLP Short Term Goal 1 (Week 2): STGs=LTGs due to ELOS SLP Short Term Goal 1 - Progress (Week 2): Not met    New Short Term Goals: Week 3: SLP Short Term Goal 1 (Week 3): Patient will consume current diet with supervision verbal cues for use of swallowing compensatory strategies. SLP Short Term Goal 2 (Week 3): Patient will demosntrate efficient mastication and complete oral clerance with trials of Dys. 3 textures (if dentures available) without overt s/s of aspiration and supervision verbal cues over 2 sessions prior to upgrade.  Weekly Progress Updates: Patient continues to make gains this reporting period. Patient is currently consuming Dys. 2 textures with thin liquids with minimal overt s/s of aspiration but requires Min verbal cues for use of slow rate of self-feeding and small bites/sips (patient reports baseline). Recommend patient continue on current diet but if/when patient's dentures become available, recommend trials of Dys. 3 textures. Spoke to the patient's sister, she verbalized understanding. Patient would benefit from continued skilled SLP intervention to maximize his swallowing function prior to discharge.   Intensity: Minumum of 1-2 x/day, 30 to 90 minutes Frequency: 1 to 3 out of 7 days Duration/Length of Stay: 11/29/20 Treatment/Interventions: English as a second language teacher;Therapeutic Activities;Therapeutic Exercise;Oral motor exercises;Dysphagia/aspiration precaution training;Patient/family education    Ismahan Lippman 11/30/2020, 6:42 AM

## 2020-11-30 NOTE — Progress Notes (Signed)
Physical Therapy Session Note  Patient Details  Name: [name] MRN: [id number] Date of Birth: Dec 08, 1956  Today's Date: 11/30/2020 PT Individual Time: 0905-1000 and 0569-7948 PT Individual Time Calculation (min): 55 min and 70 min   Short Term Goals: Week 3:  PT Short Term Goal 1 (Week 3): Patient will perform dynamic balance activities in standing >10 min. PT Short Term Goal 2 (Week 3): Patient will ambulate >150 ft with close supervision for safety using LRAD. PT Short Term Goal 3 (Week 3): Patient will improve his score on the Berg Balance Scale to >36/56 to indicate reduced fall risk with standing activities.  Skilled Therapeutic Interventions/Progress Updates:     Session 1: Patient in bed upon PT arrival. Patient alert and agreeable to PT session. Patient denied pain during session.  Therapeutic Activity: Bed Mobility: Patient performed supine to sit in a flat hospital bed and supin to/from sit in the ADL bed with supervision-mod I. Provided verbal cues for moving slowly for safety due to impulsivity. Transfers: Patient performed sit to/from stand 5 with supervision. Provided verbal cues for reaching back to sit to control descent. Patient performed an ambulatory transfer to the bathroom to void in standing with CGA during session. Ambulated and stood at the sink with CGA to perform hand hygiene after.   Gait Training:  Patient ambulated >250 ft and >100 feet x3 with CGA-close supervision using RW on first due to patient's request, reports he feels safer using it. However, on observation patient with increased R trunk lean, unsafe proximity to RW, and L circumduction gait hitting RW. Improved without RW with more erect posture and improved hip/knee flexion for swing through versus circumduction on L without RW. Provided verbal cues for decreased gait speed for controlled stepping and improved gait mechanics, also for management of HR. HR 91-121 after gait trials. Provided  prolonged rest breaks after for HR to recover to 100-110 before continuing activity. Provided cues for diaphragmatic breathing and relaxation during rest breaks for improved recovery.  Patient ascended/descended 12-6" steps on first trial and 8-6" steps on second trial using R rail to simulate home set-up with min-mod A. Performed reciprocal gait pattern with intermittent step-to gait pattern. Patient very fast and impulsive during first trail requiring increased assist and PT holding L hand due to poor awareness. Provided visual demonstration and verbal instructions on safe technique, including step-to gait pattern leading with R while ascending and L while ascending between trials. Improved speed and increased step-to gait pattern with only intermittent reciprocal stepping. PT held patient's L hand throughout for safety due to poor attention/awareness. Provided cues for technique and sequencing throughout. HR 130's after both stair trials, recovered to 100-110 <2 min. Patient reports that he only goes up stairs with performing chores, but states he does not have to go up the stairs during his daily activities.  Patient in w/c in the room at end of session with breaks locked, seat belt alarm set, and all needs within reach.   Session 2: Patient in w/c in the room upon PT arrival. Patient alert and agreeable to PT session. Patient denied pain during session.  Therapeutic Activity: Transfers: Patient performed sit to/from stand with supervision throughout session. Provided verbal cues for as above. Patient performed an ambulatory transfer to the bathroom to void in standing with CGA during session. Ambulated and stood at the sink with CGA to perform hand hygiene after.   Gait Training:  Patient ambulated >250 feet x2 without AD with CGA-min  A due to decreased L foot clearance this session. Required mod A x1 each trial due to catching his L toe leading to increased LOB. Ambulated with as above. Provided  verbal cues for significantly reduced gait speed for safety and increased knee/hip flexion to improve foot clearance. Patient with mild-moderate SOB after trials, focused on controlled breathing and educated on signs and symptoms of elevated HR with mobility for patient to begin self monitoring for safety with mobility. Will need a lot of reinforcement due to memory deficits.  Attempted to don L walk-on PLS AFO, but patient's shoes unable to go on with the AFO in place due to shoe style.  Neuromuscular Re-ed: Patient performed the following standing balance and upper/lower extremity motor control activities: -standing using the BITS for visual scanning with patient initiated timing with single white dot stimulus x3, then with turning feature for increased visual challenge x3, focused on standing balance and tolerance, encouraged the patient to use his L hand, but patient adamantly refused to attempt left hand trials at this time, patient stood x4 min 12 sec before requesting a sitting rest break and stated he was unable to continue due to fatigue, however did not demonstrate outward signs of fatigue, HR 101 -tall kneeling with bench in front for upper/extremity support, focused on weight bearing through L hand with hand over hand assist for placement and approximation at hand and wrist for increased proprioceptive feedback, focused on pushing equally through B hands and L elbow extension/triceps activation -tall kneeling to sitting on heels x10 focused on controlled movement for eccentric glute and hamstring activation and maintaining L hand on the bench in front -patient donned gloves with max A for L hand and set-up assist for R hand, then wiped down the bench and mat table using his R hand initially then switching to his L with cues for increased balance and motor challenge performing a functional task (patient performed window washing and cleaning tables for chores at group home)  Patient in w/c in  the room at end of session with breaks locked, seat belt alarm set, and all needs within reach.    Therapy Documentation Precautions:  Precautions Precautions: Fall Precaution Comments: L hemiparesis, PEG Restrictions Weight Bearing Restrictions: No   Therapy/Group: Individual Therapy  Nekeisha Aure L Geno Sydnor PT, DPT  11/30/2020, 6:33 AM

## 2020-11-30 NOTE — Progress Notes (Signed)
Occupational Therapy Weekly Progress Note  Patient Details  Name: [name] MRN: [id number] Date of Birth: 1956/08/16  Beginning of progress report period: November 23, 2020 End of progress report period: November 30, 2020  Today's Date: 11/30/2020 OT Individual Time: 1100-1200 OT Individual Time Calculation (min): 60 min    STGs were set to LTG d/t initial ELOS, however extension was appropriate to reach supervision level. Pt has met UB dressing, bathing and footwear goal however occasionally requires CGA for functional transfers as well as standing balance during ADLs d/t impulsivity and quickness to get up. Likely pt was demonstrating this behavior and level of decreased safety at home PTA. Pt would benefit from continued skilled OT to improve safety and decrease risk of falling prior to discharge back to the group home.  Patient continues to demonstrate the following deficits: muscle weakness, decreased cardiorespiratoy endurance, impaired timing and sequencing, abnormal tone, unbalanced muscle activation, motor apraxia, ataxia, decreased coordination and decreased motor planning, decreased attention to left, decreased attention, decreased awareness, decreased problem solving, decreased safety awareness, decreased memory and delayed processing and decreased sitting balance, decreased standing balance, decreased postural control, hemiplegia and decreased balance strategies and therefore will continue to benefit from skilled OT intervention to enhance overall performance with BADL and iADL.  Patient progressing toward long term goals..  Continue plan of care.  OT Short Term Goals Week 1:  OT Short Term Goal 1 (Week 1): Pt will wait to initiate transfer till after w/c brakes are locked to demo improved safety awareness wiht no VC OT Short Term Goal 1 - Progress (Week 1): Progressing toward goal OT Short Term Goal 2 (Week 1): Pt will transfer to toielt wiht CGA OT Short Term Goal 2 -  Progress (Week 1): Met OT Short Term Goal 3 (Week 1): Pt will don LB clothing wiht A only for balance OT Short Term Goal 3 - Progress (Week 1): Met OT Short Term Goal 4 (Week 1): Pt will complete 2/3 steps of toileting OT Short Term Goal 4 - Progress (Week 1): Met OT Short Term Goal 5 (Week 1): Pt will bathe with MIN A and no more than min VC for sequencing/thoroughness OT Short Term Goal 5 - Progress (Week 1): Met  Skilled Therapeutic Interventions/Progress Updates:    1:1. Pt received in w/c agreeable to OT with no pain reported. Pt completes standing endurance task standing ~15 min with cross body reaching and lateral reaching for dynamic balance/weight shifting while putting push pins up on vertical board to make figure of a christmas tree with initial errors in the beginning improving to no cuing/errors and then graded peg board activity for visual perception with S for standing balance. Pt ambulates in hallway with MAX VC and CGA for slowing down as pt nearly running at time. Pt completes number search at ambulatory level 1-10 with mod-max VC for visual scanning prior to mindlessly searching and unable to recall any number location already found. Exited session with pt sated in w.c, exit alarm on and call light tin reach   Therapy Documentation Precautions:  Precautions Precautions: Fall Precaution Comments: L hemiparesis, PEG Restrictions Weight Bearing Restrictions: No General:   Vital Signs: Therapy Vitals Temp: 98.1 F (36.7 C) Temp Source: Oral Pulse Rate: 96 Resp: 16 BP: 135/87 Patient Position (if appropriate): Lying Oxygen Therapy SpO2: 98 % O2 Device: Room Air Pain:   ADL: ADL Grooming: Minimal assistance Where Assessed-Grooming: Sitting at sink Upper Body Bathing: Minimal assistance Where Assessed-Upper Body  Bathing: Sitting at sink Lower Body Bathing: Moderate assistance Where Assessed-Lower Body Bathing: Sitting at sink, Standing at sink Upper Body  Dressing: Moderate assistance Where Assessed-Upper Body Dressing: Sitting at sink Lower Body Dressing: Maximal assistance Where Assessed-Lower Body Dressing: Standing at sink, Sitting at sink Toileting: Maximal assistance Where Assessed-Toileting: Glass blower/designer: Psychiatric nurse Method: Arts development officer: Systems analyst    Praxis   Exercises:   Other Treatments:     Therapy/Group: Individual Therapy  Tonny Branch 11/30/2020, 6:49 AM

## 2020-11-30 NOTE — Progress Notes (Signed)
Patient ID: DALVIN CLIPPER, male   DOB: 05-29-1956, 64 y.o.   MRN: [id number]   SW called to follow up with Tabitha RN with [facility] [phone] to provide updates from team conference on pt gains made here in rehab and pt being ready for d/c, no lift chair needed, and pt remaining on D2 thin unless dentures are found in which there will be a discussion to upgrade to D3. Questioned if medical team can support that [facility] staff- Wyatt Mage, Amy Habilitation Specialist, and [employer] Manager will be here on Friday 12/10 10am-12pm for family edu.   SW called pt sister [name] to inform on above. She is not sure she will be here this day due to death in the family and the funeral is on Friday.   Cecile Sheerer, MSW, LCSWA Office: [phone] Cell: [phone] Fax: [phone]

## 2020-11-30 NOTE — Progress Notes (Signed)
[place] PHYSICAL MEDICINE & REHABILITATION PROGRESS NOTE   Subjective/Complaints: Uneventful night. No new complaints  ROS: Limited due to cognitive/behavioral   Objective:   No results found. No results for input(s): WBC, HGB, HCT, PLT in the last 72 hours. No results for input(s): NA, K, CL, CO2, GLUCOSE, BUN, CREATININE, CALCIUM in the last 72 hours.  Intake/Output Summary (Last 24 hours) at 11/30/2020 0827 Last data filed at 11/30/2020 0757 Gross per 24 hour  Intake 720 ml  Output --  Net 720 ml   Physical Exam: Vital Signs Blood pressure 135/87, pulse 96, temperature 98.1 F (36.7 C), temperature source Oral, resp. rate 16, height 5\' 4"  (1.626 m), weight 66.7 kg, SpO2 98 %.  Constitutional: No distress . Vital signs reviewed. HEENT: EOMI, oral membranes moist Neck: supple Cardiovascular: RRR without murmur. No JVD    Respiratory/Chest: CTA Bilaterally without wheezes or rales. Normal effort    GI/Abdomen: BS +, non-tender, non-distended, PEG intact Ext: no clubbing, cyanosis, or edema Psych: pleasant and cooperative Musc: No edema in extremities.  No tenderness in extremities. Neuro: Alert Dysarthric, follows all commands  Motor: RUE/RLE: 4+/5 proximal distal LUE: 3+ to 4-/5 proximal to distal with apraxia LLE: 4/5 hip flexion, knee extension, 1/5 ankle dorsiflexion--stable  Assessment/Plan: 1. Functional deficits which require 3+ hours per day of interdisciplinary therapy in a comprehensive inpatient rehab setting.  Physiatrist is providing close team supervision and 24 hour management of active medical problems listed below.  Physiatrist and rehab team continue to assess barriers to discharge/monitor patient progress toward functional and medical goals  Care Tool:  Bathing    Body parts bathed by patient: Left arm, Chest, Abdomen, Front perineal area, Right upper leg, Left upper leg, Face, Buttocks, Right lower leg, Left lower leg, Right arm   Body parts  bathed by helper: Right arm, Left arm     Bathing assist Assist Level: Minimal Assistance - Patient > 75%     Upper Body Dressing/Undressing Upper body dressing   What is the patient wearing?: Pull over shirt    Upper body assist Assist Level: Minimal Assistance - Patient > 75%    Lower Body Dressing/Undressing Lower body dressing      What is the patient wearing?: Underwear/pull up, Pants     Lower body assist Assist for lower body dressing: Contact Guard/Touching assist     Toileting Toileting    Toileting assist Assist for toileting: Contact Guard/Touching assist     Transfers Chair/bed transfer  Transfers assist     Chair/bed transfer assist level: Supervision/Verbal cueing Chair/bed transfer assistive device:   Ambulation assist      Assist level: Minimal Assistance - Patient > 75% Assistive device: Walker-rolling Max distance: 200   Walk 10 feet activity   Assist     Assist level: Contact Guard/Touching assist Assistive device: Walker-rolling   Walk 50 feet activity   Assist Walk 50 feet with 2 turns activity did not occur: Safety/medical concerns (fatigue, weakness, decreased balance/postural control)  Assist level: Contact Guard/Touching assist Assistive device: Walker-rolling    Walk 150 feet activity   Assist Walk 150 feet activity did not occur: Safety/medical concerns (fatigue, weakness, decreased balance/postural control)  Assist level: Minimal Assistance - Patient > 75% Assistive device: Walker-rolling    Walk 10 feet on uneven surface  activity   Assist Walk 10 feet on uneven surfaces activity did not occur: Safety/medical concerns (fatigue, weakness, decreased balance/postural control, poor safety awareness)  Wheelchair     Assist Will patient use wheelchair at discharge?: Yes Type of Wheelchair: Manual Wheelchair activity did not occur: Refused  Wheelchair assist  level: Moderate Assistance - Patient 50 - 74% Max wheelchair distance: 150    Wheelchair 50 feet with 2 turns activity    Assist        Assist Level: Moderate Assistance - Patient 50 - 74%   Wheelchair 150 feet activity     Assist      Assist Level: Moderate Assistance - Patient 50 - 74%   Blood pressure 135/87, pulse 96, temperature 98.1 F (36.7 C), temperature source Oral, resp. rate 16, height 5\' 4"  (1.626 m), weight 66.7 kg, SpO2 98 %.  Medical Problem List and Plan: 1.  Decreased functional mobility secondary to acute hypoxic hypercapnic respiratory failure with aspiration/ventilatory support pneumonia  Continue CIR---> plan [facility] to return to [facility] 2.  Antithrombotics: -DVT/anticoagulation: Subcutaneous heparin             -antiplatelet therapy: N/A 3. Pain Management: Denies pain. Not using oxycodone- will d/c 4. Mood: no SSRIs             -antipsychotic agents: Seroquel 400 mg twice daily 5. Neuropsych: This patient is not capable of making decisions on his own behalf. 6. Skin/Wound Care: Routine skin checks 7. Fluids/Electrolytes/Nutrition:  Prealbumin 34  -dc'ed TF and H20 flushes  100% p.o. intake 8.  Paraesophageal hernia/massively dilated esophagus.  Status post hernia repair percutaneous gastrostomy tube placement 10/26/2020 per cardiothoracic surgery Dr. 13/02/2020 changed to placement of jejunal arm via indwelling gastrostomy tube 11/07/2020 per interventional radiology.    -Continue Protonix twice daily, MiraLAX twice daily, Colace twice daily.    -remove GJtube as outpt 9.  Iron deficiency anemia.    Hemoglobin 11.0 on 12/3  Continue to monitor 10.  Schizophrenia/intellectual delay/seizure disorder.  Valproic acid 250 mg twice daily, Lamictal 100 mg daily, seroquel 400mg  bid  Sz free since admission to CIR-12/6 11.  Hypothyroidism.  Synthroid 12.  Type 2 diabetes mellitus with hyperglycemia  .  Hemoglobin A1c 5.8.  SSI.  Continue SSI  Changed SSI to  coincide with meals  CBGs controlled 13.  Hyperlipidemia.  Crestor 14.  History of CVA with left-sided residual weakness continue therapies as directed 15.  Dysphagia  HS TF as above  CT angio chest personally reviewed-esophagus looks relatively stable, esophagram unremarkable for leaks  -D2 thins diet recommended after MBS 11/30 16.  Hyponatremia  Sodium 134 12/3 17. Cards: tachycardia --- resolved  -EKG12/3  with LVH, sinus tachy  -troponins normal x 2  -repeat 2D echo per cardiology--really unremarkable  -tachy with activity, 120's --added low dose lopressor 12/7  12/8 observe HR today LOS: 15 days A FACE TO FACE EVALUATION WAS PERFORMED  14/7 11/30/2020, 8:27 AM

## 2020-12-01 ENCOUNTER — Inpatient Hospital Stay (HOSPITAL_COMMUNITY): Payer: Medicare Other

## 2020-12-01 ENCOUNTER — Inpatient Hospital Stay (HOSPITAL_COMMUNITY): Payer: Medicare Other | Admitting: Physical Therapy

## 2020-12-01 LAB — GLUCOSE, CAPILLARY
Glucose-Capillary: 125 mg/dL — ABNORMAL HIGH (ref 70–99)
Glucose-Capillary: 133 mg/dL — ABNORMAL HIGH (ref 70–99)
Glucose-Capillary: 107 mg/dL — ABNORMAL HIGH (ref 70–99)
Glucose-Capillary: 124 mg/dL — ABNORMAL HIGH (ref 70–99)

## 2020-12-01 MED ORDER — DIVALPROEX SODIUM 500 MG PO DR TAB
500.0000 mg | DELAYED_RELEASE_TABLET | Freq: Two times a day (BID) | ORAL | Status: DC
  Administered 2020-12-01 – 2020-12-07 (×12): 500 mg via ORAL
  Filled 2020-12-01 (×13): qty 1

## 2020-12-01 MED ORDER — DIVALPROEX SODIUM 250 MG PO DR TAB
250.0000 mg | DELAYED_RELEASE_TABLET | Freq: Once | ORAL | Status: AC
  Administered 2020-12-01: 250 mg via ORAL
  Filled 2020-12-01: qty 1

## 2020-12-01 MED ORDER — METOPROLOL TARTRATE 25 MG PO TABS
25.0000 mg | ORAL_TABLET | Freq: Two times a day (BID) | ORAL | Status: DC
  Administered 2020-12-01 – 2020-12-07 (×12): 25 mg via ORAL
  Filled 2020-12-01 (×12): qty 1

## 2020-12-01 NOTE — Progress Notes (Signed)
Occupational Therapy Session Note  Patient Details  Name: REMUS HAGEDORN MRN: [id number] Date of Birth: May 04, 1956  Today's Date: 12/01/2020 OT Individual Time: 1300-1410 OT Individual Time Calculation (min): 70 min    Short Term Goals: Week 1:  OT Short Term Goal 1 (Week 1): Pt will wait to initiate transfer till after w/c brakes are locked to demo improved safety awareness wiht no VC OT Short Term Goal 1 - Progress (Week 1): Progressing toward goal OT Short Term Goal 2 (Week 1): Pt will transfer to toielt wiht CGA OT Short Term Goal 2 - Progress (Week 1): Met OT Short Term Goal 3 (Week 1): Pt will don LB clothing wiht A only for balance OT Short Term Goal 3 - Progress (Week 1): Met OT Short Term Goal 4 (Week 1): Pt will complete 2/3 steps of toileting OT Short Term Goal 4 - Progress (Week 1): Met OT Short Term Goal 5 (Week 1): Pt will bathe with MIN A and no more than min VC for sequencing/thoroughness OT Short Term Goal 5 - Progress (Week 1): Met  Skilled Therapeutic Interventions/Progress Updates:     Pt received in bed with sister present. Pt resting, but agreeable to shower and dress. Pt sister reporting group home staff will be present tomorrow to see how pt is doing. OT educates on CLOF, safety concerns and discharge planning. Sister requesting to speak to MD about elevated HR. MD notified. Sister also states that GI physician from [facility] recommending HOB elevated >30*, OT applies sign above bed to notify staff. Occlusives applied prior to shower to cover PEG site.  ADL:  Pt completes bathing with supervision at sit to stand level using grab bar Pt completes UB dressing with supervision at sit to stand level to pull shirt down back Pt completes LB dressing with supervision-CGA at sit to stand level with A only to steady while advancing pants past hips Pt completes footwear with set up Pt completes toileting with S standing to urinate Pt completes toileting transfer with  close supervisiona nd VC for pace Pt completes shower/Tub transfer with supervision-CGA at ambulatory level into bathroom with no AD. Pt practices simulated shower step over ledge with implementation of "stand, stop, step" sequence to slow pt down and increase safety with CGA.   Therapeutic exercise Pt provided with PROM of LUE with focus on ranges opposite of flexor synergy with prolonged time spent on wrist and digit composite extension to reduce risk of contracture.     Pt left at end of session in bed with exit alarm on, call light in reach and all needs met   Therapy Documentation Precautions:  Precautions Precautions: Fall Precaution Comments: L hemiparesis, PEG Restrictions Weight Bearing Restrictions: No General:   Vital Signs: Therapy Vitals Temp: 98.6 F (37 C) Temp Source: Oral Pulse Rate: (!) 107 Resp: 20 BP: (!) 142/96 Patient Position (if appropriate): Sitting Oxygen Therapy SpO2: 97 % O2 Device: Room Air Pain:   ADL: ADL Grooming: Minimal assistance Where Assessed-Grooming: Sitting at sink Upper Body Bathing: Minimal assistance Where Assessed-Upper Body Bathing: Sitting at sink Lower Body Bathing: Moderate assistance Where Assessed-Lower Body Bathing: Sitting at sink,Standing at sink Upper Body Dressing: Moderate assistance Where Assessed-Upper Body Dressing: Sitting at sink Lower Body Dressing: Maximal assistance Where Assessed-Lower Body Dressing: Standing at sink,Sitting at sink Toileting: Maximal assistance Where Assessed-Toileting: Glass blower/designer: Psychiatric nurse Method: Arts development officer: Landscape architect  Exercises:   Other Treatments:     Therapy/Group: Individual Therapy  Tonny Branch 12/01/2020, 12:07 PM

## 2020-12-01 NOTE — Progress Notes (Signed)
Patient resting in bed, no complications noted at this time. Seizure precaution pads added to bed. New order for depakote. No s/s of distress noted at this time.  Jay Schlichter, LPN

## 2020-12-01 NOTE — Progress Notes (Signed)
While supervising patient eating breakfast, patient began to have tremors and and seizure-like activity. Patients words become slurred, charge nurse called to room for assistance. When I placed my hand firmly on patients shoulder and hand, the seizure-like activity settled. Patient was alert and oriented with slurring of the speech still. The seizure-like activity lasts about 60 seconds long. VS taken; temp 98.6, blood pressure 142/96, pule rate 107, respirations 20 and oxygen 97% @ room air. Assisted patient with charge nurse back to bed. PA, Jesusita Oka and doctor swartz notified. No new orders at this time. No further complications noted at this time.  Jay Schlichter, LPN

## 2020-12-01 NOTE — Progress Notes (Signed)
Physical Therapy Session Note  Patient Details  Name: [name] MRN: [id number] Date of Birth: 10/27/1956  Today's Date: 12/01/2020 PT Individual Time: 7408-1448 PT Individual Time Calculation (min): 48 min   Short Term Goals: Week 3:  PT Short Term Goal 1 (Week 3): Patient will perform dynamic balance activities in standing >10 min. PT Short Term Goal 2 (Week 3): Patient will ambulate >150 ft with close supervision for safety using LRAD. PT Short Term Goal 3 (Week 3): Patient will improve his score on the Berg Balance Scale to >36/56 to indicate reduced fall risk with standing activities.  Skilled Therapeutic Interventions/Progress Updates:    Dr. Riley Kill cleared pt for therapy. Pt received supine in bed, reports he feels "good" and is agreeable to therapy session. Assessed vitals in supine: BP 120/89, HR 100bpm, SpO2 97% Supine>sitting L EOB, HOB flat and not using bedrail, with supervision for safety due to increased speed of movement but no unsteadiness. Sitting EOB donned R shoe set-up assist and L shoe with AFO total assist - due to heel lift and decreased depth of tennis shoe there is little room for pt's heel to fit inside the shoe with both the heel lift and AFO but able to get on enough to try. Sit<>stands with CGA for steadying/safety during session. Gait training ~131ft to main therapy gym using RW with CGA/min assist for steadying - demos decreased L LE foot clearance and step length with midfoot landing on initial contact and L knee hyperextension thrust during mid to terminal stance. Reassessed HR to be 115bpm with it decreasing to 103bpm with seated rest break. Doffed AFO and performed another bout of gait training ~147ft using RW with CGA/min assist for balance - noticed pt landing on forefoot with decreased L LE foot clearance and step length causing continued L knee hyperextension thrust during mid to terminal stance but might be slightly less than with AFO but difficult  to determine (of note pt still wearing heel wedge) - when turning around in gait pt's L foot ended up outside of RW requiring min assist for balance and cuing to correct, reinforced education on importance of increased focus on L LE stepping while turning with pt reporting he just wasn't paying attention. Reassesed HR 118bpm decreasing to 106bpm with seated rest. Gait training ~73ft using RW then took away RW and ambulated another ~22ft without AD - cued pt for increased L LE hip/knee flexion and heel strike when using RW with minimal improvement noted, without AD demos increased L LE hip external rotation allowing pt to use adductors for LE advancement and via compensation land on heel at initial contact compared when using RW but also demonstrates increased R/L lateral trunk lean/flexion and increased postural sway. Reassessed vitals: HR 128bpm decreasing to 107bpm with seated rest. L LE NMR via repeated L foot taps on/off 6" step,without UE support targeting increased L LE hip/knee flexion with pt demonstrating adequate activation to clear foot and place on step but impaired motor planning to lift foot off and step back - demos quick speed of movement and decreased safety awareness not stopping L LE stepping despite increased imbalance - heavy min assist for balance - performed same task with 3lb weight added on L LE to increase muscle recruitment. Gait training ~170ft using RW while wearing 3lb weight on L LE - weight requires pt to slow down gait speed (a little) and promote increased focus on L LE foot clearance with increased muscle recruitment - results in  slightly longer step lengths and improved heel strike with resultant decreased knee hyperextension thrust though still occurs. Reassessed HR 133bpm decreasing to 108bpm after ~82minute seated rest break. Attempted standing L LE hamstring curls using BUE support on RW and 3lb weight on L ankle but pt demos significantly impaired motor planning/recruitment  and can only perform knee flexion if simultaneously performing hip flexion - otherwise co-contracts and activates quads - removed weight and same result - will continue to benefit from focus on improved isolation of hamstring muscle activation. Gait training ~16ft back to room using RW with CGA/min assist for balance - wearing heel wedge but not AFO - continues to demo lack of full L LE foot clearance during swing resulting in poor initial contact causing knee hyperextension thrust in stance. Upon return to room pt reports need to use bathroom. Pt left sitting on toilet with NT present to assume care of patient.   Therapy Documentation Precautions:  Precautions Precautions: Fall Precaution Comments: L hemiparesis, PEG Restrictions Weight Bearing Restrictions: No  Pain:   No reports of pain throughout session.   Therapy/Group: Individual Therapy  Ginny Forth , PT, DPT, CSRS  12/01/2020, 7:55 AM

## 2020-12-01 NOTE — Progress Notes (Signed)
Physical Therapy Session Note  Patient Details  Name: [name] MRN: [id number] Date of Birth: [birth year]  Today's Date: 12/01/2020 PT Individual Time: 1105-1145 PT Individual Time Calculation (min): 40 min   Short Term Goals: Week 3:  PT Short Term Goal 1 (Week 3): Patient will perform dynamic balance activities in standing >10 min. PT Short Term Goal 2 (Week 3): Patient will ambulate >150 ft with close supervision for safety using LRAD. PT Short Term Goal 3 (Week 3): Patient will improve his score on the Berg Balance Scale to >36/56 to indicate reduced fall risk with standing activities.  Skilled Therapeutic Interventions/Progress Updates:     Patient in bed upon PT arrival. Patient alert and agreeable to PT session. Patient denied pain during session, reported that he needed to have a BM. Patient performed an ambulatory transfer to the bathroom with CGA and requested PT step out for privacy, due to seizure-like episode this morning maintained patient in line of sight during toileting. Patient missed 15 min of skilled PT due to toileting, RN made aware. Will attempt to make-up missed time as able.    Therapeutic Activity: Bed Mobility: Patient performed supine to sit with supervision for safety due to impulsivity. Provided verbal cues for increased safety awareness and controlled movement. Transfers: Patient performed sit to/from stand x2 with 2 attempts and min A due to poor foot placement and impulsivity and x5 with supervision. Provided verbal cues for foot placement, safety awareness, and controlled descent.  Gait Training:  Patient ambulated >100 feet x3 without and AD with CGA-min A. Ambulated withincreased R trunk lean and intermittent circumduction on L to compensate for decreased L hip/knee flexion in swing leading to toe catching intermittently. Provided verbal cues forerect posture, looking ahead, decreased gait speed for controlled stepping, and focused on increased  hamstring activation in pre-swing for increased foot clearance without compensatory strategies.  Neuromuscular Re-ed: Patient performed the following activities for increased L hamstring activation to promote improved L foot clearance with gait: -min A-CGA stepping over 10 bean bags with his L foot with goals to not step on or kick the bean bags: 1st trial: hit 7 bean bags, 2nd trial: hit 4 bean bags, 3rd trial: stepped on all but 1 bean bag due inability to correct foot placement after stepping on the first bean bag despite max cues -CGA step-tap to second step on 6" steps with B upper extremity support 2x10 focused on controlled hamstring contraction to clear his foot each trial -hamstring curls with PT blocking hips with shoulders and facilitating knee flexion without hip flexion, as patient compensates with hip/trunk flexion during activity, with mod-max A  Patient in w/c in the room at end of session with breaks locked, seat belt alarm set, and all needs within reach.    Therapy Documentation Precautions:  Precautions Precautions: Fall Precaution Comments: L hemiparesis, PEG Restrictions Weight Bearing Restrictions: No General: PT Amount of Missed Time (min): 15 Minutes PT Missed Treatment Reason: Toileting   Therapy/Group: Individual Therapy  Johannah Rozas L Jaidee Stipe PT, DPT  12/01/2020, 5:19 PM

## 2020-12-01 NOTE — Progress Notes (Signed)
[place] PHYSICAL MEDICINE & REHABILITATION PROGRESS NOTE   Subjective/Complaints: RN contacted me about tremors and seizure-like movements she witnessed this morning around 0830. Symptom lasted about a minute than subsided. I saw patient around 0845 and he was back to baseline. I asked him if he had experienced episodes like this before, and he said "once a month."  Pt denies fatigue, weakness, says he feels normal now. Saw pt walking with PT about 30" minutes later without any problem.  ROS: Limited due to cognitive/behavioral    Objective:   No results found. No results for input(s): WBC, HGB, HCT, PLT in the last 72 hours. No results for input(s): NA, K, CL, CO2, GLUCOSE, BUN, CREATININE, CALCIUM in the last 72 hours.  Intake/Output Summary (Last 24 hours) at 12/01/2020 1014 Last data filed at 12/01/2020 0900 Gross per 24 hour  Intake 413 ml  Output --  Net 413 ml   Physical Exam: Vital Signs Blood pressure (!) 142/96, pulse (!) 107, temperature 98.6 F (37 C), temperature source Oral, resp. rate 20, height 5\' 4"  (1.626 m), weight 68.2 kg, SpO2 97 %.  Constitutional: No distress . Vital signs reviewed. HEENT: EOMI, oral membranes moist Neck: supple Cardiovascular: 90's without murmur. No JVD    Respiratory/Chest: CTA Bilaterally without wheezes or rales. Normal effort    GI/Abdomen: BS +, non-tender, non-distended Ext: no clubbing, cyanosis, or edema Psych: pleasant and cooperative Musc: No edema in extremities.  No tenderness in extremities. Neuro: Alert Dysarthric, follows all commands  Motor: RUE/RLE: 4+/5 proximal distal LUE: 3+ to 4-/5 proximal to distal with apraxia--no change LLE: 4/5 hip flexion, knee extension, 1/5 ankle dorsiflexion--stable  Assessment/Plan: 1. Functional deficits which require 3+ hours per day of interdisciplinary therapy in a comprehensive inpatient rehab setting.  Physiatrist is providing close team supervision and 24 hour management of  active medical problems listed below.  Physiatrist and rehab team continue to assess barriers to discharge/monitor patient progress toward functional and medical goals  Care Tool:  Bathing    Body parts bathed by patient: Left arm,Chest,Abdomen,Front perineal area,Right upper leg,Left upper leg,Face,Buttocks,Right lower leg,Left lower leg,Right arm   Body parts bathed by helper: Right arm,Left arm     Bathing assist Assist Level: Minimal Assistance - Patient > 75%     Upper Body Dressing/Undressing Upper body dressing   What is the patient wearing?: Pull over shirt    Upper body assist Assist Level: Minimal Assistance - Patient > 75%    Lower Body Dressing/Undressing Lower body dressing      What is the patient wearing?: Underwear/pull up,Pants     Lower body assist Assist for lower body dressing: Contact Guard/Touching assist     Toileting Toileting    Toileting assist Assist for toileting: Contact Guard/Touching assist     Transfers Chair/bed transfer  Transfers assist     Chair/bed transfer assist level: Supervision/Verbal cueing Chair/bed transfer assistive device:   Ambulation assist      Assist level: Minimal Assistance - Patient > 75% Assistive device: No Device Max distance: 250 ft   Walk 10 feet activity   Assist     Assist level: Minimal Assistance - Patient > 75% Assistive device: Walker-rolling   Walk 50 feet activity   Assist Walk 50 feet with 2 turns activity did not occur: Safety/medical concerns (fatigue, weakness, decreased balance/postural control)  Assist level: Minimal Assistance - Patient > 75% Assistive device: Walker-rolling    Walk 150 feet activity  Assist Walk 150 feet activity did not occur: Safety/medical concerns (fatigue, weakness, decreased balance/postural control)  Assist level: Minimal Assistance - Patient > 75% Assistive device: Walker-rolling    Walk 10 feet on  uneven surface  activity   Assist Walk 10 feet on uneven surfaces activity did not occur: Safety/medical concerns (fatigue, weakness, decreased balance/postural control, poor safety awareness)         Wheelchair     Assist Will patient use wheelchair at discharge?: Yes Type of Wheelchair: Manual Wheelchair activity did not occur: Refused  Wheelchair assist level: Moderate Assistance - Patient 50 - 74% Max wheelchair distance: 150    Wheelchair 50 feet with 2 turns activity    Assist        Assist Level: Moderate Assistance - Patient 50 - 74%   Wheelchair 150 feet activity     Assist      Assist Level: Moderate Assistance - Patient 50 - 74%   Blood pressure (!) 142/96, pulse (!) 107, temperature 98.6 F (37 C), temperature source Oral, resp. rate 20, height 5\' 4"  (1.626 m), weight 68.2 kg, SpO2 97 %.  Medical Problem List and Plan: 1.  Decreased functional mobility secondary to acute hypoxic hypercapnic respiratory failure with aspiration/ventilatory support pneumonia  Continue CIR---> plan is to return to [facility] 2.  Antithrombotics: -DVT/anticoagulation: Subcutaneous heparin             -antiplatelet therapy: N/A 3. Pain Management: Denies pain. Not using oxycodone- will d/c 4. Mood: no SSRIs             -antipsychotic agents: Seroquel 400 mg twice daily 5. Neuropsych: This patient is not capable of making decisions on his own behalf. 6. Skin/Wound Care: Routine skin checks 7. Fluids/Electrolytes/Nutrition:  Prealbumin 34  -dc'ed TF and H20 flushes  100% p.o. intake 8.  Paraesophageal hernia/massively dilated esophagus.  Status post hernia repair percutaneous gastrostomy tube placement 10/26/2020 per cardiothoracic surgery Dr. 13/02/2020 changed to placement of jejunal arm via indwelling gastrostomy tube 11/07/2020 per interventional radiology.    -Continue Protonix twice daily, MiraLAX twice daily, Colace twice daily.    -remove GJtube as outpt 9.  Iron  deficiency anemia.    Hemoglobin 11.0 on 12/3  Continue to monitor 10.  Schizophrenia/intellectual delay/seizure disorder.  Valproic acid 250 mg twice daily, Lamictal 100 mg daily, seroquel 400mg  bid  12/9 Had seizure like episode today, no post-ictal symptoms   -last vpa level on 11/10 was low--increase to 500mg  bid   -check level on Monday  11.  Hypothyroidism.  Synthroid 12.  Type 2 diabetes mellitus with hyperglycemia  .  Hemoglobin A1c 5.8.  SSI.  Continue SSI  Changed SSI to coincide with meals  CBGs controlled 13.  Hyperlipidemia.  Crestor 14.  History of CVA with left-sided residual weakness continue therapies as directed 15.  Dysphagia  HS TF as above  CT angio chest personally reviewed-esophagus looks relatively stable, esophagram unremarkable for leaks  -D2 thins diet recommended after MBS 11/30 16.  Hyponatremia  Sodium 134 12/3 17. Cards: tachycardia --- resolved  -EKG12/3  with LVH, sinus tachy  -troponins normal x 2  -repeat 2D echo per cardiology--really unremarkable  -tachy with activity, 120's --added low dose lopressor 12/7  12/9 HR still somewhat elevated. Increase lopressor to 25mg  bid LOS: 16 days A FACE TO FACE EVALUATION WAS PERFORMED  14/3 12/01/2020, 10:14 AM

## 2020-12-01 NOTE — Progress Notes (Signed)
Speech Language Pathology Daily Session Note  Patient Details  Name: [name] MRN: [id number] Date of Birth: 1956-07-26  Today's Date: 12/01/2020 SLP Individual Time: 0355-9741 SLP Individual Time Calculation (min): 31 min  Short Term Goals: Week 3: SLP Short Term Goal 1 (Week 3): Patient will consume current diet with supervision verbal cues for use of swallowing compensatory strategies. SLP Short Term Goal 2 (Week 3): Patient will demosntrate efficient mastication and complete oral clerance with trials of Dys. 3 textures (if dentures available) without overt s/s of aspiration and supervision verbal cues over 2 sessions prior to upgrade.  Skilled Therapeutic Interventions: Skilled treatment session focused on dysphagia goals. SLP facilitated session by providing trials of Dys. 3 textures alongside Dys 2 meal. Pt requiring Mod fading to Min verbal cues to reduce bolus size and rate of intake with advanced texture trials. Pt with no overt s/s aspiration with any trials Dys. 2, Dys. 3 or thin liquid via cup or straw. Pt remains without dentures, therefore diet advancement not recommended at this time. Pt continues to benefit from full supervision for safety 2' decreased ability to self-monitor and utilize recommended swallow strategies independently. Pt left upright in wheelchair with alarm on and all needs within reach. Cont ST POC.   Pain Pain Assessment Pain Scale: 0-10 Pain Score: 0-No pain  Therapy/Group: Individual Therapy  Tacey Ruiz 12/01/2020, 12:12 PM

## 2020-12-02 ENCOUNTER — Inpatient Hospital Stay (HOSPITAL_COMMUNITY): Payer: Medicare Other

## 2020-12-02 ENCOUNTER — Encounter (HOSPITAL_COMMUNITY): Payer: Medicare Other | Admitting: Speech Pathology

## 2020-12-02 ENCOUNTER — Encounter (HOSPITAL_COMMUNITY): Payer: Medicare Other

## 2020-12-02 ENCOUNTER — Encounter (HOSPITAL_COMMUNITY): Payer: Medicare Other | Admitting: Physical Therapy

## 2020-12-02 LAB — GLUCOSE, CAPILLARY
Glucose-Capillary: 119 mg/dL — ABNORMAL HIGH (ref 70–99)
Glucose-Capillary: 111 mg/dL — ABNORMAL HIGH (ref 70–99)
Glucose-Capillary: 136 mg/dL — ABNORMAL HIGH (ref 70–99)
Glucose-Capillary: 135 mg/dL — ABNORMAL HIGH (ref 70–99)

## 2020-12-02 LAB — COMPREHENSIVE METABOLIC PANEL
ALT: 17 U/L (ref 0–44)
AST: 18 U/L (ref 15–41)
Albumin: 3.1 g/dL — ABNORMAL LOW (ref 3.5–5.0)
Alkaline Phosphatase: 79 U/L (ref 38–126)
Anion gap: 12 (ref 5–15)
BUN: 14 mg/dL (ref 8–23)
CO2: 24 mmol/L (ref 22–32)
Calcium: 9.4 mg/dL (ref 8.9–10.3)
Chloride: 99 mmol/L (ref 98–111)
Creatinine, Ser: 1.09 mg/dL (ref 0.61–1.24)
GFR, Estimated: >60 mL/min (ref >60)
Glucose, Bld: 127 mg/dL — ABNORMAL HIGH (ref 70–99)
Potassium: 4.3 mmol/L (ref 3.5–5.1)
Sodium: 135 mmol/L (ref 135–145)
Total Bilirubin: 0.7 mg/dL (ref 0.3–1.2)
Total Protein: 7.5 g/dL (ref 6.5–8.1)

## 2020-12-02 LAB — CBC
HCT: 34.9 % — ABNORMAL LOW (ref 39.0–52.0)
Hemoglobin: 11.3 g/dL — ABNORMAL LOW (ref 13.0–17.0)
MCH: 26.5 pg (ref 26.0–34.0)
MCHC: 32.4 g/dL (ref 30.0–36.0)
MCV: 81.7 fL (ref 80.0–100.0)
Platelets: 302 10*3/uL (ref 150–400)
RBC: 4.27 MIL/uL (ref 4.22–5.81)
RDW: 17.6 % — ABNORMAL HIGH (ref 11.5–15.5)
WBC: 4.9 10*3/uL (ref 4.0–10.5)
nRBC: 0 % (ref 0.0–0.2)

## 2020-12-02 NOTE — Progress Notes (Signed)
[place] PHYSICAL MEDICINE & REHABILITATION PROGRESS NOTE   Subjective/Complaints: Pt tells me he had a good night. No more episodes after yesterday. Was completely fine after the seizure like movements, even participating fully in PT 30 minutes later.   ROS: Limited due to cognitive/behavioral    Objective:   No results found. Recent Labs    12/02/20 0415  WBC 4.9  HGB 11.3*  HCT 34.9*  PLT 302   Recent Labs    12/02/20 0415  NA 135  K 4.3  CL 99  CO2 24  GLUCOSE 127*  BUN 14  CREATININE 1.09  CALCIUM 9.4    Intake/Output Summary (Last 24 hours) at 12/02/2020 1019 Last data filed at 12/02/2020 0759 Gross per 24 hour  Intake 886 ml  Output 1 ml  Net 885 ml   Physical Exam: Vital Signs Blood pressure 111/69, pulse 79, temperature 98.4 F (36.9 C), temperature source Oral, resp. rate 18, height 5\' 4"  (1.626 m), weight 67.7 kg, SpO2 95 %.  Constitutional: No distress . Vital signs reviewed. HEENT: EOMI, oral membranes moist Neck: supple Cardiovascular: RRR without murmur. No JVD    Respiratory/Chest: CTA Bilaterally without wheezes or rales. Normal effort    GI/Abdomen: BS +, non-tender, non-distended Ext: no clubbing, cyanosis, or edema Psych: pleasant but distracted Musc: No edema in extremities.  No tenderness in extremities. Neuro: Alert Dysarthric, follows all commands  Motor: RUE/RLE: 4+/5 proximal distal LUE: 3+ to 4-/5 proximal to distal with apraxia--no change LLE: 4/5 hip flexion, knee extension, 1/5 ankle dorsiflexion--stable  Assessment/Plan: 1. Functional deficits which require 3+ hours per day of interdisciplinary therapy in a comprehensive inpatient rehab setting.  Physiatrist is providing close team supervision and 24 hour management of active medical problems listed below.  Physiatrist and rehab team continue to assess barriers to discharge/monitor patient progress toward functional and medical goals  Care Tool:  Bathing    Body  parts bathed by patient: Left arm,Chest,Abdomen,Front perineal area,Right upper leg,Left upper leg,Face,Buttocks,Right lower leg,Left lower leg,Right arm   Body parts bathed by helper: Right arm,Left arm     Bathing assist Assist Level: Minimal Assistance - Patient > 75%     Upper Body Dressing/Undressing Upper body dressing   What is the patient wearing?: Pull over shirt    Upper body assist Assist Level: Minimal Assistance - Patient > 75%    Lower Body Dressing/Undressing Lower body dressing      What is the patient wearing?: Underwear/pull up,Pants     Lower body assist Assist for lower body dressing: Contact Guard/Touching assist     Toileting Toileting    Toileting assist Assist for toileting: Contact Guard/Touching assist     Transfers Chair/bed transfer  Transfers assist     Chair/bed transfer assist level: Supervision/Verbal cueing Chair/bed transfer assistive device:   Ambulation assist      Assist level: Minimal Assistance - Patient > 75% Assistive device: Walker-rolling Max distance: 137ft   Walk 10 feet activity   Assist     Assist level: Minimal Assistance - Patient > 75% Assistive device: Walker-rolling   Walk 50 feet activity   Assist Walk 50 feet with 2 turns activity did not occur: Safety/medical concerns (fatigue, weakness, decreased balance/postural control)  Assist level: Minimal Assistance - Patient > 75% Assistive device: Walker-rolling    Walk 150 feet activity   Assist Walk 150 feet activity did not occur: Safety/medical concerns (fatigue, weakness, decreased balance/postural control)  Assist level:  Minimal Assistance - Patient > 75% Assistive device: Walker-rolling    Walk 10 feet on uneven surface  activity   Assist Walk 10 feet on uneven surfaces activity did not occur: Safety/medical concerns (fatigue, weakness, decreased balance/postural control, poor safety awareness)          Wheelchair     Assist Will patient use wheelchair at discharge?: Yes Type of Wheelchair: Manual Wheelchair activity did not occur: Refused  Wheelchair assist level: Moderate Assistance - Patient 50 - 74% Max wheelchair distance: 150    Wheelchair 50 feet with 2 turns activity    Assist        Assist Level: Moderate Assistance - Patient 50 - 74%   Wheelchair 150 feet activity     Assist      Assist Level: Moderate Assistance - Patient 50 - 74%   Blood pressure 111/69, pulse 79, temperature 98.4 F (36.9 C), temperature source Oral, resp. rate 18, height 5\' 4"  (1.626 m), weight 67.7 kg, SpO2 95 %.  Medical Problem List and Plan: 1.  Decreased functional mobility secondary to acute hypoxic hypercapnic respiratory failure with aspiration/ventilatory support pneumonia  Continue CIR---> plan is to return to Group home  -I spoke with sister at length today re: his HR and seizure history, plans for return to [facility]. 2.  Antithrombotics: -DVT/anticoagulation: Subcutaneous heparin             -antiplatelet therapy: N/A 3. Pain Management: Denies pain. Not using oxycodone- will d/c 4. Mood: no SSRIs             -antipsychotic agents: Seroquel 400 mg twice daily 5. Neuropsych: This patient is not capable of making decisions on his own behalf. 6. Skin/Wound Care: Local care to G/J tube 7. Fluids/Electrolytes/Nutrition:  Prealbumin 34  -dc'ed TF and H20 flushes  100% p.o. intake  12/10 I personally reviewed the patient's labs today and are normal. Albumin level continues to climb 8.  Paraesophageal hernia/massively dilated esophagus.  Status post hernia repair percutaneous gastrostomy tube placement 10/26/2020 per cardiothoracic surgery Dr. 13/02/2020 changed to placement of jejunal arm via indwelling gastrostomy tube 11/07/2020 per interventional radiology.    -Continue Protonix twice daily, MiraLAX twice daily, Colace twice daily.    -remove GJtube as outpt 9.  Iron  deficiency anemia.    Hemoglobin 11.0 on 12/3  Continue to monitor 10.  Schizophrenia/intellectual delay/seizure disorder.  Valproic acid 250 mg twice daily, Lamictal 100 mg daily, seroquel 400mg  bid  12/9 Had seizure like episode today, no post-ictal symptoms   -last vpa level on 11/10 was low--increased to 500mg  bid 12/9  12/10-re-check level on Monday 12/13    -sister told me that VPA level was checked frequently at home 11.  Hypothyroidism.  Synthroid 12.  Type 2 diabetes mellitus with hyperglycemia  .  Hemoglobin A1c 5.8.  SSI.  Continue SSI  Changed SSI to coincide with meals  CBGs controlled 13.  Hyperlipidemia.  Crestor 14.  History of CVA with left-sided residual weakness continue therapies as directed 15.  Dysphagia  HS TF as above  CT angio chest personally reviewed-esophagus looks relatively stable, esophagram unremarkable for leaks  -D2 thins diet recommended after MBS 11/30 16.  Hyponatremia  Sodium 134 12/3 17. Cards: tachycardia --- resolved  -EKG12/3  with LVH, sinus tachy  -troponins normal x 2  -repeat 2D echo per cardiology--really unremarkable  -tachy with activity, 120's --added low dose lopressor 12/7  12/9 HR still somewhat elevated. Increased lopressor to 25mg  bid  12/10 HR up to 100-110 with activity with therapy currently.    Continue to observe LOS: 17 days A FACE TO FACE EVALUATION WAS PERFORMED  Ranelle Oyster 12/02/2020, 10:19 AM

## 2020-12-02 NOTE — Progress Notes (Signed)
Occupational Therapy Note  Patient Details  Name: [name] MRN: [id number] Date of Birth: 1956-06-07  Today's Date: 12/02/2020 OT Missed Time: 60 Minutes Missed Time Reason: Patient unwilling/refused to participate without medical reason;Patient fatigue  Pt missed 60 mins skilled OT services. Pt sleeping upon arrival. Pt required mod multimodal cues to arouse. Pt unable to keep eyes open and refused therapy. Pt remained in bed with all needs within reach. Bed alarm activated.   Lavone Neri [facility] 12/02/2020, 1:51 PM

## 2020-12-02 NOTE — Progress Notes (Signed)
Speech Language Pathology Daily Session Note  Patient Details  Name: [name] MRN: [id number] Date of Birth: 12/25/1955  Today's Date: 12/02/2020 SLP Individual Time: 1130-1200 SLP Individual Time Calculation (min): 30 min  Short Term Goals: Week 3: SLP Short Term Goal 1 (Week 3): Patient will consume current diet with supervision verbal cues for use of swallowing compensatory strategies. SLP Short Term Goal 2 (Week 3): Patient will demosntrate efficient mastication and complete oral clerance with trials of Dys. 3 textures (if dentures available) without overt s/s of aspiration and supervision verbal cues over 2 sessions prior to upgrade.  Skilled Therapeutic Interventions: Skilled treatment session focused on dysphagia goals and completion of education with the patient's caregivers. Patient consumed Dys. 2 textures with thin liquids without overt s/s of aspiration and required Min verbal cues for use of swallowing compensatory strategies. Recommend patient continue current diet. Patient's caregivers present and educated on current diet recommendations, appropriate textures, medication administration and compensatory strategies. All verbalized understanding and handouts were given for reinforcement of information. Patient handed off to NT. Continue with current plan of care.       Pain No/Denies Pain   Therapy/Group: Individual Therapy  Jasara Corrigan 12/02/2020, 12:48 PM

## 2020-12-02 NOTE — Plan of Care (Signed)
  Problem: Consults °Goal: RH GENERAL PATIENT EDUCATION °Description: See Patient Education module for education specifics. °Outcome: Progressing °  °Problem: RH BLADDER ELIMINATION °Goal: RH STG MANAGE BLADDER WITH ASSISTANCE °Description: STG Manage Bladder With min Assistance °Outcome: Progressing °  °Problem: RH SKIN INTEGRITY °Goal: RH STG MAINTAIN SKIN INTEGRITY WITH ASSISTANCE °Description: STG Maintain Skin Integrity With min Assistance. °Outcome: Progressing °  °Problem: RH SAFETY °Goal: RH STG ADHERE TO SAFETY PRECAUTIONS W/ASSISTANCE/DEVICE °Description: STG Adhere to Safety Precautions With cues/reminders. °Outcome: Progressing °  °Problem: RH KNOWLEDGE DEFICIT GENERAL °Goal: RH STG INCREASE KNOWLEDGE OF SELF CARE AFTER HOSPITALIZATION °Description: Pt and cg will be able to verbalize understanding of educated materials upon discharge. °Outcome: Progressing °  °

## 2020-12-02 NOTE — Progress Notes (Signed)
Physical Therapy Session Note  Patient Details  Name: [name] MRN: [id number] Date of Birth: Oct 26, 1956  Today's Date: 12/02/2020 PT Individual Time: 0800-0830 PT Individual Time Calculation (min): 30 min   Short Term Goals: Week 1:  PT Short Term Goal 1 (Week 1): pt will transfer bed<>chair with LRAD and CGA PT Short Term Goal 1 - Progress (Week 1): Met PT Short Term Goal 2 (Week 1): Pt will ambulate 5f with LRAD and CGA PT Short Term Goal 2 - Progress (Week 1): Met PT Short Term Goal 3 (Week 1): Pt will perform simulated car transfer with LRAD and min A PT Short Term Goal 3 - Progress (Week 1): Met Week 2:  PT Short Term Goal 1 (Week 2): Patient will perform basic transfers with supervision consistently. PT Short Term Goal 1 - Progress (Week 2): Met PT Short Term Goal 2 (Week 2): Patient will ambulate >150 ft with close supervision for safety using LRAD. PT Short Term Goal 2 - Progress (Week 2): Progressing toward goal PT Short Term Goal 3 (Week 2): Patient will perform dynamic balance activities in standing >10 min. PT Short Term Goal 3 - Progress (Week 2): Progressing toward goal Week 3:  PT Short Term Goal 1 (Week 3): Patient will perform dynamic balance activities in standing >10 min. PT Short Term Goal 2 (Week 3): Patient will ambulate >150 ft with close supervision for safety using LRAD. PT Short Term Goal 3 (Week 3): Patient will improve his score on the Berg Balance Scale to >36/56 to indicate reduced fall risk with standing activities.  Skilled Therapeutic Interventions/Progress Updates:    PAIN denies pain   Pt seen this am for 30 min session w/focus on am ADLs Pt initially eating breakfast, cues for pacing Dons shoes w/mod assist.  AFO not in room.  Pt states "that other lady took it". Pt request immediate assist to BR due to urgent need for BM. Sit to stand from wc w/supervision, short distance gait to commode w/supervision, cues for safety.   Pt  continent of bowels but additional time required/requires supervision due to impulsivity/safety awareness. Performs hygiene in sitting w/supervision Sit to stand from commode w/supervision and raises pants w/supervis8ion. Short distance gt to sink w/supervision, washes/dries hands w/supervision. Gait 2089fw/cga to max assist due to inconsistent clearance LLE without AFO and anterior loss of balance due to tripping, max assist to prevent lob requried x 3 during gait. Pt turn/sit to wc w/cga. Pt left oob in wc w/alarm belt set and needs in reach   Therapy Documentation Precautions:  Precautions Precautions: Fall Precaution Comments: L hemiparesis, PEG Restrictions Weight Bearing Restrictions: No   Therapy/Group: Individual Therapy  [place]ie FieldingPT[place]2/09/2020, 4:10 PM

## 2020-12-02 NOTE — Progress Notes (Signed)
Occupational Therapy Session Note  Patient Details  Name: [name] MRN: [id number] Date of Birth: 02/06/1956  Today's Date: 12/02/2020 OT Individual Time: 0930-1030 OT Individual Time Calculation (min): 60 min  (15 min make up time).  Short Term Goals: Week 3:  OT Short Term Goal 1 (Week 3): STG=LTG d/t ELOS  Skilled Therapeutic Interventions/Progress Updates:    1;1. Pt received 15 min make up time prior to education with group home staff to make up for missed minutes earlier in the week. Pt completes standing bell cancellation test with only 1 error and 2 cues for continuation of task standing ~ 6 min demoing improved endurance. Pt completes functional mobility with and without RW throughtout hallway and into bathroom. Pts carefivers from group home arrive and OT demo mobility with/without RW. Reports from staff that moibliyt and balance is better than previously. Understanding of full supervision communicated and balance challenges in stanidng during ADLs recommends close supervision to decrease risk of falling as well as use of shower chair. Staff and pt ambulate into bathroom with staff appropriately cuing pt for safety and slowing pace. Exited session with pt seated in w/c, exit alarm on and call light in reach   Therapy Documentation Precautions:  Precautions Precautions: Fall Precaution Comments: L hemiparesis, PEG Restrictions Weight Bearing Restrictions: No General:   Vital Signs:   Pain: Pain Assessment Pain Scale: 0-10 Pain Score: 0-No pain ADL: ADL Grooming: Minimal assistance Where Assessed-Grooming: Sitting at sink Upper Body Bathing: Minimal assistance Where Assessed-Upper Body Bathing: Sitting at sink Lower Body Bathing: Moderate assistance Where Assessed-Lower Body Bathing: Sitting at sink,Standing at sink Upper Body Dressing: Moderate assistance Where Assessed-Upper Body Dressing: Sitting at sink Lower Body Dressing: Maximal assistance Where  Assessed-Lower Body Dressing: Standing at sink,Sitting at sink Toileting: Maximal assistance Where Assessed-Toileting: Teacher, adult education: Curator Method: Surveyor, minerals: Scientist, research (medical)    Praxis   Exercises:   Other Treatments:     Therapy/Group: Individual Therapy  [name] 12/02/2020, 9:24 AM

## 2020-12-02 NOTE — Progress Notes (Signed)
Physical Therapy Session Note  Patient Details  Name: [name] MRN: [id number] Date of Birth: 07-09-1956  Today's Date: 12/02/2020 PT Individual Time: 1035-1125 PT Individual Time Calculation (min): 50 min   Short Term Goals: Week 3:  PT Short Term Goal 1 (Week 3): Patient will perform dynamic balance activities in standing >10 min. PT Short Term Goal 2 (Week 3): Patient will ambulate >150 ft with close supervision for safety using LRAD. PT Short Term Goal 3 (Week 3): Patient will improve his score on the Berg Balance Scale to >36/56 to indicate reduced fall risk with standing activities.  Skilled Therapeutic Interventions/Progress Updates:    Pt received sitting in w/c with 3 caregivers from group home present for education. Pt agreeable to therapy session. Caregivers report that based on earlier OT session with observation of pt, he is ambulating much better than prior to arrival with them reporting he is clearing his L LE significantly more. They report based on observing pt ambulate with and without RW during OT session they feel more comfortable with pt using RW compared to no AD - therapist agreed with this and reinforced this education with pt throughout session. Educated caregivers on pt's current use of heel wedge in L shoe and having tried AFO with no significant improvement in gait mechanics noted (pt's sister planning to bring alternate shoes later today but not available at this time).  Educated caregivers on using RW L hand orthotic to sustain grasp on AD during mobility. Also, educated them on pt's recently elevated HR with activity and monitoring it more closely along with importance of monitoring BP due to pt hx of CVA - the group home RN confirmed they have regular vital sign assessments.   Discussed the following with pt and group home staff: - pt's impulsivity with movement, they report this is pt's baseline and that they are very familiar with providing him "verbal  prompts" to slow down for safety - transportation, they report having a 16 passenger van with ~6inch step up onto running board to get in/out of the [place] with grab bars available inside [place]; they report feeling very confident/comfortable assisting pt with this task as many of the group home members require assistance for this - discussed community mobility with pt's possible need of transport chair to increase pt safety - they report if this is the case, pt would need to have his own personal transport chair as they cannot provide him with one - they confirm that pt will need RW as he does not currently have one - confirmed the group home is 1 story with level entry and that pt can have chores modified to meet his mobility level at this time to ensure his safety while still allowing full participate with the group  Sit<>stands using RW with close supervision for safety - min cuing for L hand placement on/off RW orthotic. Gait training ~194ft to main therapy gym using RW with CGA and ~5x min assist due to not clearing L foot fully during swing causing minor anterior LOB, primarily occurs in busy/distracting hallway environment. Discussed pt's possible need to step on/off curb using RW - demonstrated technique and sequencing to pt. Pt stepped on/off 4" curb x6reps using RW with min assist primarily for safe AD management as pt would have it tilted without all 4 points of RW on ground causing increased instability with balance, stepped up with L LE and down with R LE despite cuing to perform opposite but no significant  unsteadiness noted with this technique - will need continued training with this otherwise may consider not using AD if it is causing more challenges with motor planning/sequencing. Caregivers report pt really enjoys kneeling down on floor with forearms resting on bed in prayer position to read his Bible each day. Performed floor transfer with pt selecting to stand up and then turn to kneel down on  floor using UE support on mat, CGA/min assist for safety with pt moving somewhat quickly-  then transitioned back into standing relying on UE support on mat to push up then really quickly pivoting hips around to sit on mat - cuing for slower movements with this and group home staff agreeable to assist pt with this task. Therapist provided pt with RW bag and educated on safe use. Gait training ~230ft back to room using RW with continued assist and cuing for improved gait mechanics as noted above - educated group home staff that when pt turning or in a smaller, household environment he may have increased challenges with AD management requiring increased cuing and/orr assistance for safety. Group home staff verbalized understanding of all education and report feeling confident and comfortable assisting patient upon discharge. Pt left seated in w/c with needs in reach and seat belt alarm on.  Therapy Documentation Precautions:  Precautions Precautions: Fall Precaution Comments: L hemiparesis, PEG Restrictions Weight Bearing Restrictions: No  Pain: No reports of pain throughout session.   Therapy/Group: Individual Therapy  Ginny Forth , PT, DPT, CSRS  12/02/2020, 10:15 AM

## 2020-12-03 ENCOUNTER — Inpatient Hospital Stay (HOSPITAL_COMMUNITY): Payer: Medicare Other

## 2020-12-03 LAB — GLUCOSE, CAPILLARY
Glucose-Capillary: 127 mg/dL — ABNORMAL HIGH (ref 70–99)
Glucose-Capillary: 101 mg/dL — ABNORMAL HIGH (ref 70–99)
Glucose-Capillary: 104 mg/dL — ABNORMAL HIGH (ref 70–99)
Glucose-Capillary: 130 mg/dL — ABNORMAL HIGH (ref 70–99)

## 2020-12-03 NOTE — Plan of Care (Signed)
°  Problem: Consults Goal: RH GENERAL PATIENT EDUCATION Description: See Patient Education module for education specifics. Outcome: Progressing   Problem: RH BLADDER ELIMINATION Goal: RH STG MANAGE BLADDER WITH ASSISTANCE Description: STG Manage Bladder With min Assistance Outcome: Progressing   Problem: RH SKIN INTEGRITY Goal: RH STG MAINTAIN SKIN INTEGRITY WITH ASSISTANCE Description: STG Maintain Skin Integrity With min Assistance. Outcome: Progressing   Problem: RH SAFETY Goal: RH STG ADHERE TO SAFETY PRECAUTIONS W/ASSISTANCE/DEVICE Description: STG Adhere to Safety Precautions With cues/reminders. Outcome: Progressing   Problem: RH KNOWLEDGE DEFICIT GENERAL Goal: RH STG INCREASE KNOWLEDGE OF SELF CARE AFTER HOSPITALIZATION Description: Pt and cg will be able to verbalize understanding of educated materials upon discharge. Outcome: Progressing

## 2020-12-03 NOTE — Progress Notes (Signed)
Occupational Therapy Note  Patient Details  Name: [name] MRN: [id number] Date of Birth: [birth year]  Today's Date: 12/03/2020 OT Missed Time: 75 Minutes Missed Time Reason: Patient unwilling/refused to participate without medical reason;Patient fatigue  Pt asleep upon arrival. Pt had not been out of bed entire day. OT attempted to engage pt in tx, however pt kept stating, "no" and "how about tomorrow." despite edu re no tx tomorrow, pt continues to decline. Will follow up as able.    Elenore Paddy Arena Lindahl 12/03/2020, 3:16 PM

## 2020-12-03 NOTE — Progress Notes (Signed)
[facility] PHYSICAL MEDICINE & REHABILITATION PROGRESS NOTE   Subjective/Complaints: No complaints this morning. Denies pain, constipation, insomnia.   ROS: Denies pain    Objective:   No results found. Recent Labs    12/02/20 0415  WBC 4.9  HGB 11.3*  HCT 34.9*  PLT 302   Recent Labs    12/02/20 0415  NA 135  K 4.3  CL 99  CO2 24  GLUCOSE 127*  BUN 14  CREATININE 1.09  CALCIUM 9.4    Intake/Output Summary (Last 24 hours) at 12/03/2020 1328 Last data filed at 12/03/2020 0109 Gross per 24 hour  Intake 537 ml  Output --  Net 537 ml   Physical Exam: Vital Signs Blood pressure 104/61, pulse 83, temperature 98.6 F (37 C), temperature source Oral, resp. rate 19, height 5\' 4"  (1.626 m), weight 67.9 kg, SpO2 96 %.  Gen: no distress, normal appearing HEENT: oral mucosa pink and moist, NCAT Cardio: Reg rate Chest: normal effort, normal rate of breathing Abd: soft, non-distended Ext: no edema Skin: intact Psych: pleasant but distracted Musc: No edema in extremities.  No tenderness in extremities. Neuro: Alert Dysarthric, follows all commands  Motor: RUE/RLE: 4+/5 proximal distal LUE: 3+ to 4-/5 proximal to distal with apraxia--no change LLE: 4/5 hip flexion, knee extension, 1/5 ankle dorsiflexion--stable  Assessment/Plan: 1. Functional deficits which require 3+ hours per day of interdisciplinary therapy in a comprehensive inpatient rehab setting.  Physiatrist is providing close team supervision and 24 hour management of active medical problems listed below.  Physiatrist and rehab team continue to assess barriers to discharge/monitor patient progress toward functional and medical goals  Care Tool:  Bathing    Body parts bathed by patient: Left arm,Chest,Abdomen,Front perineal area,Right upper leg,Left upper leg,Face,Buttocks,Right lower leg,Left lower leg,Right arm   Body parts bathed by helper: Right arm,Left arm     Bathing assist Assist Level:  Minimal Assistance - Patient > 75%     Upper Body Dressing/Undressing Upper body dressing   What is the patient wearing?: Pull over shirt    Upper body assist Assist Level: Minimal Assistance - Patient > 75%    Lower Body Dressing/Undressing Lower body dressing      What is the patient wearing?: Underwear/pull up,Pants     Lower body assist Assist for lower body dressing: Contact Guard/Touching assist     Toileting Toileting    Toileting assist Assist for toileting: Contact Guard/Touching assist     Transfers Chair/bed transfer  Transfers assist     Chair/bed transfer assist level: Supervision/Verbal cueing Chair/bed transfer assistive device:   Ambulation assist      Assist level: Minimal Assistance - Patient > 75% Assistive device: Walker-rolling Max distance: 23ft   Walk 10 feet activity   Assist     Assist level: Minimal Assistance - Patient > 75% Assistive device: Walker-rolling   Walk 50 feet activity   Assist Walk 50 feet with 2 turns activity did not occur: Safety/medical concerns (fatigue, weakness, decreased balance/postural control)  Assist level: Minimal Assistance - Patient > 75% Assistive device: Walker-rolling    Walk 150 feet activity   Assist Walk 150 feet activity did not occur: Safety/medical concerns (fatigue, weakness, decreased balance/postural control)  Assist level: Minimal Assistance - Patient > 75% Assistive device: Walker-rolling    Walk 10 feet on uneven surface  activity   Assist Walk 10 feet on uneven surfaces activity did not occur: Safety/medical concerns (fatigue, weakness, decreased balance/postural control,  poor safety awareness)         Wheelchair     Assist Will patient use wheelchair at discharge?: Yes Type of Wheelchair: Manual Wheelchair activity did not occur: Refused  Wheelchair assist level: Moderate Assistance - Patient 50 - 74% Max wheelchair  distance: 150    Wheelchair 50 feet with 2 turns activity    Assist        Assist Level: Moderate Assistance - Patient 50 - 74%   Wheelchair 150 feet activity     Assist      Assist Level: Moderate Assistance - Patient 50 - 74%   Blood pressure 104/61, pulse 83, temperature 98.6 F (37 C), temperature source Oral, resp. rate 19, height 5\' 4"  (1.626 m), weight 67.9 kg, SpO2 96 %.  Medical Problem List and Plan: 1.  Decreased functional mobility secondary to acute hypoxic hypercapnic respiratory failure with aspiration/ventilatory support pneumonia  Continue CIR---> plan is to return to Group home  -I spoke with sister at length today re: his HR and seizure history, plans for return to [facility]. 2.  Antithrombotics: -DVT/anticoagulation: Subcutaneous heparin             -antiplatelet therapy: N/A 3. Pain Management: Denies pain. Not using oxycodone- will d/c 4. Mood: no SSRIs             -antipsychotic agents: Seroquel 400 mg twice daily 5. Neuropsych: This patient is not capable of making decisions on his own behalf. 6. Skin/Wound Care: Local care to G/J tube 7. Fluids/Electrolytes/Nutrition:  Prealbumin 34  -dc'ed TF and H20 flushes  100% Gerald.o. intake  12/10 I personally reviewed the patient's labs today and are normal. Albumin level continues to climb 8.  Paraesophageal hernia/massively dilated esophagus.  Status post hernia repair percutaneous gastrostomy tube placement 10/26/2020 per cardiothoracic surgery Dr. 13/02/2020 changed to placement of jejunal arm via indwelling gastrostomy tube 11/07/2020 per interventional radiology.    -Continue Protonix twice daily, MiraLAX twice daily, Colace twice daily.    -remove GJtube as outpt 9.  Iron deficiency anemia.    Hemoglobin 11.0 on 12/3  Continue to monitor 10.  Schizophrenia/intellectual delay/seizure disorder.  Valproic acid 250 mg twice daily, Lamictal 100 mg daily, seroquel 400mg  bid  12/9 Had seizure like episode today,  no post-ictal symptoms   -last vpa level on 11/10 was low--increased to 500mg  bid 12/9  12/10-re-check level on Monday 12/13    -sister told me that VPA level was checked frequently at home 11.  Hypothyroidism.  Synthroid 12.  Type 2 diabetes mellitus with hyperglycemia  .  Hemoglobin A1c 5.8.  SSI.  Continue SSI  Changed SSI to coincide with meals  CBGs ranging 101 to 135 on 12/11- continue checks AC/HS 13.  Hyperlipidemia.  Crestor 14.  History of CVA with left-sided residual weakness continue therapies as directed 15.  Dysphagia  HS TF as above  CT angio chest personally reviewed-esophagus looks relatively stable, esophagram unremarkable for leaks  -D2 thins diet recommended after MBS 11/30 16.  Hyponatremia  Sodium 134 12/3, 135 on 12/10, repeat Monday  17. Cards: tachycardia --- resolved  -EKG12/3  with LVH, sinus tachy  -troponins normal x 2  -repeat 2D echo per cardiology--really unremarkable  -tachy with activity, 120's --added low dose lopressor 12/7  12/9 HR still somewhat elevated. Increased lopressor to 25mg  bid  12/10 HR up to 100-110 with activity with therapy currently.    Continue to observe  12/11: HR stable at rest- monitor TID LOS:  18 days A FACE TO FACE EVALUATION WAS PERFORMED  [name] [name] 12/03/2020, 1:28 PM

## 2020-12-04 LAB — GLUCOSE, CAPILLARY
Glucose-Capillary: 126 mg/dL — ABNORMAL HIGH (ref 70–99)
Glucose-Capillary: 103 mg/dL — ABNORMAL HIGH (ref 70–99)
Glucose-Capillary: 92 mg/dL (ref 70–99)
Glucose-Capillary: 111 mg/dL — ABNORMAL HIGH (ref 70–99)

## 2020-12-04 NOTE — Progress Notes (Signed)
Occupational Therapy Session Note  Patient Details  Name: [name] MRN: [id number] Date of Birth: 1956/04/06  Today's Date: 12/04/2020 OT Individual Time: 2902-1115 OT Individual Time Calculation (min): 27 min    Short Term Goals: Week 1:  OT Short Term Goal 1 (Week 1): Pt will wait to initiate transfer till after w/c brakes are locked to demo improved safety awareness wiht no VC OT Short Term Goal 1 - Progress (Week 1): Progressing toward goal OT Short Term Goal 2 (Week 1): Pt will transfer to toielt wiht CGA OT Short Term Goal 2 - Progress (Week 1): Met OT Short Term Goal 3 (Week 1): Pt will don LB clothing wiht A only for balance OT Short Term Goal 3 - Progress (Week 1): Met OT Short Term Goal 4 (Week 1): Pt will complete 2/3 steps of toileting OT Short Term Goal 4 - Progress (Week 1): Met OT Short Term Goal 5 (Week 1): Pt will bathe with MIN A and no more than min VC for sequencing/thoroughness OT Short Term Goal 5 - Progress (Week 1): Met  Skilled Therapeutic Interventions/Progress Updates:    1:1. Pt received in bed agreeable to OT make up session. Pt completes stand pivot transfer with supervision. Pt completes obstacle course weaving in/out of cones and stepping over small objects with total cues for RW management. Pt unable to correctly strap hand splint. OT applies visual aide (marked on hand splint) for correct placement of strap and pt able to complete with no cues. Exited session with p tseated in w/c, exit alarm ona nd call light itn reach  Therapy Documentation Precautions:  Precautions Precautions: Fall Precaution Comments: L hemiparesis, PEG Restrictions Weight Bearing Restrictions: No General:   Vital Signs: Therapy Vitals Temp: 97.8 F (36.6 C) Temp Source: Oral Pulse Rate: 74 BP: 106/63 Patient Position (if appropriate): Lying Oxygen Therapy SpO2: 96 % O2 Device: Room Air Pain:   ADL: ADL Grooming: Minimal assistance Where  Assessed-Grooming: Sitting at sink Upper Body Bathing: Minimal assistance Where Assessed-Upper Body Bathing: Sitting at sink Lower Body Bathing: Moderate assistance Where Assessed-Lower Body Bathing: Sitting at sink,Standing at sink Upper Body Dressing: Moderate assistance Where Assessed-Upper Body Dressing: Sitting at sink Lower Body Dressing: Maximal assistance Where Assessed-Lower Body Dressing: Standing at sink,Sitting at sink Toileting: Maximal assistance Where Assessed-Toileting: Glass blower/designer: Psychiatric nurse Method: Arts development officer: Systems analyst    Praxis   Exercises:   Other Treatments:     Therapy/Group: Individual Therapy  Tonny Branch 12/04/2020, 7:30 AM

## 2020-12-04 NOTE — Plan of Care (Signed)
  Problem: Consults °Goal: RH GENERAL PATIENT EDUCATION °Description: See Patient Education module for education specifics. °Outcome: Progressing °  °Problem: RH BLADDER ELIMINATION °Goal: RH STG MANAGE BLADDER WITH ASSISTANCE °Description: STG Manage Bladder With min Assistance °Outcome: Progressing °  °Problem: RH SKIN INTEGRITY °Goal: RH STG MAINTAIN SKIN INTEGRITY WITH ASSISTANCE °Description: STG Maintain Skin Integrity With min Assistance. °Outcome: Progressing °  °Problem: RH SAFETY °Goal: RH STG ADHERE TO SAFETY PRECAUTIONS W/ASSISTANCE/DEVICE °Description: STG Adhere to Safety Precautions With cues/reminders. °Outcome: Progressing °  °Problem: RH KNOWLEDGE DEFICIT GENERAL °Goal: RH STG INCREASE KNOWLEDGE OF SELF CARE AFTER HOSPITALIZATION °Description: Pt and cg will be able to verbalize understanding of educated materials upon discharge. °Outcome: Progressing °  °

## 2020-12-04 NOTE — Progress Notes (Signed)
Occupational Therapy Session Note  Patient Details  Name: [name] MRN: [id number] Date of Birth: 12/08/1956  Today's Date: 12/04/2020 OT Individual Time: 1140-1155 OT Individual Time Calculation (min): 15 min    Short Term Goals: Week 1:  OT Short Term Goal 1 (Week 1): Pt will wait to initiate transfer till after w/c brakes are locked to demo improved safety awareness wiht no VC OT Short Term Goal 1 - Progress (Week 1): Progressing toward goal OT Short Term Goal 2 (Week 1): Pt will transfer to toielt wiht CGA OT Short Term Goal 2 - Progress (Week 1): Met OT Short Term Goal 3 (Week 1): Pt will don LB clothing wiht A only for balance OT Short Term Goal 3 - Progress (Week 1): Met OT Short Term Goal 4 (Week 1): Pt will complete 2/3 steps of toileting OT Short Term Goal 4 - Progress (Week 1): Met OT Short Term Goal 5 (Week 1): Pt will bathe with MIN A and no more than min VC for sequencing/thoroughness OT Short Term Goal 5 - Progress (Week 1): Met  Skilled Therapeutic Interventions/Progress Updates:    Pt agreeable to MAKE UP session and received in w/c with no pain reproted. Pt completes ambulation with RW and hand splint with improved carryover of strapping hand with visual aide CGA for mobility with LLE hitting RW with circumduciton step. Pt sits to complete ping pong sorting puzzle with MOD VC for problem solving puzzle. Ambulation back to room as stated above with VC for slowing pace. Exited session with pt seated in w/c, exit alarm on and call light in reach   Therapy Documentation Precautions:  Precautions Precautions: Fall Precaution Comments: L hemiparesis, PEG Restrictions Weight Bearing Restrictions: No General:   Vital Signs:   Pain:   ADL: ADL Grooming: Minimal assistance Where Assessed-Grooming: Sitting at sink Upper Body Bathing: Minimal assistance Where Assessed-Upper Body Bathing: Sitting at sink Lower Body Bathing: Moderate assistance Where  Assessed-Lower Body Bathing: Sitting at sink,Standing at sink Upper Body Dressing: Moderate assistance Where Assessed-Upper Body Dressing: Sitting at sink Lower Body Dressing: Maximal assistance Where Assessed-Lower Body Dressing: Standing at sink,Sitting at sink Toileting: Maximal assistance Where Assessed-Toileting: Glass blower/designer: Psychiatric nurse Method: Arts development officer: Systems analyst    Praxis   Exercises:   Other Treatments:     Therapy/Group: Individual Therapy  Tonny Branch 12/04/2020, 12:01 PM

## 2020-12-04 NOTE — Progress Notes (Signed)
[facility] PHYSICAL MEDICINE & REHABILITATION PROGRESS NOTE   Subjective/Complaints: No complaints this morning. Denies pain, constipation, insomnia.   ROS: Denies pain    Objective:   No results found. Recent Labs    12/02/20 0415  WBC 4.9  HGB 11.3*  HCT 34.9*  PLT 302   Recent Labs    12/02/20 0415  NA 135  K 4.3  CL 99  CO2 24  GLUCOSE 127*  BUN 14  CREATININE 1.09  CALCIUM 9.4   No intake or output data in the 24 hours ending 12/04/20 1300 Physical Exam: Vital Signs Blood pressure 106/63, pulse 74, temperature 97.8 F (36.6 C), temperature source Oral, resp. rate 18, height 5\' 4"  (1.626 m), weight 67.9 kg, SpO2 96 %.  Gen: no distress, normal appearing HEENT: oral mucosa pink and moist, NCAT Cardio: Reg rate Chest: normal effort, normal rate of breathing Abd: soft, non-distended Ext: no edema Psych: pleasant but distracted Musc: No edema in extremities.  No tenderness in extremities. Neuro: Alert Dysarthric, follows all commands  Motor: RUE/RLE: 4+/5 proximal distal LUE: 3+ to 4-/5 proximal to distal with apraxia--no change LLE: 4/5 hip flexion, knee extension, 1/5 ankle dorsiflexion--stable  Assessment/Plan: 1. Functional deficits which require 3+ hours per day of interdisciplinary therapy in a comprehensive inpatient rehab setting.  Physiatrist is providing close team supervision and 24 hour management of active medical problems listed below.  Physiatrist and rehab team continue to assess barriers to discharge/monitor patient progress toward functional and medical goals  Care Tool:  Bathing    Body parts bathed by patient: Left arm,Chest,Abdomen,Front perineal area,Right upper leg,Left upper leg,Face,Buttocks,Right lower leg,Left lower leg,Right arm   Body parts bathed by helper: Right arm,Left arm     Bathing assist Assist Level: Minimal Assistance - Patient > 75%     Upper Body Dressing/Undressing Upper body dressing   What is the  patient wearing?: Pull over shirt    Upper body assist Assist Level: Minimal Assistance - Patient > 75%    Lower Body Dressing/Undressing Lower body dressing      What is the patient wearing?: Underwear/pull up,Pants     Lower body assist Assist for lower body dressing: Contact Guard/Touching assist     Toileting Toileting    Toileting assist Assist for toileting: Contact Guard/Touching assist     Transfers Chair/bed transfer  Transfers assist     Chair/bed transfer assist level: Supervision/Verbal cueing Chair/bed transfer assistive device:   Ambulation assist      Assist level: Minimal Assistance - Patient > 75% Assistive device: Walker-rolling Max distance: 211ft   Walk 10 feet activity   Assist     Assist level: Minimal Assistance - Patient > 75% Assistive device: Walker-rolling   Walk 50 feet activity   Assist Walk 50 feet with 2 turns activity did not occur: Safety/medical concerns (fatigue, weakness, decreased balance/postural control)  Assist level: Minimal Assistance - Patient > 75% Assistive device: Walker-rolling    Walk 150 feet activity   Assist Walk 150 feet activity did not occur: Safety/medical concerns (fatigue, weakness, decreased balance/postural control)  Assist level: Minimal Assistance - Patient > 75% Assistive device: Walker-rolling    Walk 10 feet on uneven surface  activity   Assist Walk 10 feet on uneven surfaces activity did not occur: Safety/medical concerns (fatigue, weakness, decreased balance/postural control, poor safety awareness)         Wheelchair     Assist Will patient use wheelchair at  discharge?: Yes Type of Wheelchair: Manual Wheelchair activity did not occur: Refused  Wheelchair assist level: Moderate Assistance - Patient 50 - 74% Max wheelchair distance: 150    Wheelchair 50 feet with 2 turns activity    Assist        Assist Level: Moderate  Assistance - Patient 50 - 74%   Wheelchair 150 feet activity     Assist      Assist Level: Moderate Assistance - Patient 50 - 74%   Blood pressure 106/63, pulse 74, temperature 97.8 F (36.6 C), temperature source Oral, resp. rate 18, height 5\' 4"  (1.626 m), weight 67.9 kg, SpO2 96 %.  Medical Problem List and Plan: 1.  Decreased functional mobility secondary to acute hypoxic hypercapnic respiratory failure with aspiration/ventilatory support pneumonia  Continue CIR---> plan is to return to Group home  -I spoke with sister at length today re: his HR and seizure history, plans for return to [facility]. 2.  Antithrombotics: -DVT/anticoagulation: Subcutaneous heparin             -antiplatelet therapy: N/A 3. Pain Management: Denies pain. Not using oxycodone- will d/c 4. Mood: no SSRIs             -antipsychotic agents: Seroquel 400 mg twice daily 5. Neuropsych: This patient is not capable of making decisions on his own behalf. 6. Skin/Wound Care: Local care to G/J tube 7. Fluids/Electrolytes/Nutrition:  Prealbumin 34  -dc'ed TF and H20 flushes  100% p.o. intake  12/10 I personally reviewed the patient's labs today and are normal. Albumin level continues to climb 8.  Paraesophageal hernia/massively dilated esophagus.  Status post hernia repair percutaneous gastrostomy tube placement 10/26/2020 per cardiothoracic surgery Dr. 13/02/2020 changed to placement of jejunal arm via indwelling gastrostomy tube 11/07/2020 per interventional radiology.    -Continue Protonix twice daily, MiraLAX twice daily, Colace twice daily.    -remove GJtube as outpt 9.  Iron deficiency anemia.    Hemoglobin 11.0 on 12/3  Continue to monitor 10.  Schizophrenia/intellectual delay/seizure disorder.  Valproic acid 250 mg twice daily, Lamictal 100 mg daily, seroquel 400mg  bid  12/9 Had seizure like episode today, no post-ictal symptoms   -last vpa level on 11/10 was low--increased to 500mg  bid 12/9  12/10-re-check  level on Monday 12/13    -sister told me that VPA level was checked frequently at home 11.  Hypothyroidism.  Synthroid 12.  Type 2 diabetes mellitus with hyperglycemia  .  Hemoglobin A1c 5.8.  SSI.  Continue SSI  Changed SSI to coincide with meals  CBGs ranging 101 to 130 on 12/12- continue checks AC/HS 13.  Hyperlipidemia.  Continue Crestor 20mg  daily 14.  History of CVA with left-sided residual weakness continue therapies as directed 15.  Dysphagia  HS TF as above  CT angio chest personally reviewed-esophagus looks relatively stable, esophagram unremarkable for leaks  -D2 thins diet recommended after MBS 11/30 16.  Hyponatremia  Sodium 134 12/3, 135 on 12/10, repeat Monday  17. Cards: tachycardia --- resolved  -EKG12/3  with LVH, sinus tachy  -troponins normal x 2  -repeat 2D echo per cardiology--really unremarkable  -tachy with activity, 120's --added low dose lopressor 12/7  12/9 HR still somewhat elevated. Increased lopressor to 25mg  bid  12/10 HR up to 100-110 with activity with therapy currently.    Continue to observe  12/12: HR stable at rest- monitor TID LOS: 19 days A FACE TO FACE EVALUATION WAS PERFORMED  Sebastyan Snodgrass P Pricilla Moehle 12/04/2020, 1:00 PM

## 2020-12-05 ENCOUNTER — Inpatient Hospital Stay (HOSPITAL_COMMUNITY): Payer: Medicare Other

## 2020-12-05 ENCOUNTER — Inpatient Hospital Stay (HOSPITAL_COMMUNITY): Payer: Medicare Other | Admitting: Speech Pathology

## 2020-12-05 LAB — GLUCOSE, CAPILLARY
Glucose-Capillary: 143 mg/dL — ABNORMAL HIGH (ref 70–99)
Glucose-Capillary: 119 mg/dL — ABNORMAL HIGH (ref 70–99)
Glucose-Capillary: 133 mg/dL — ABNORMAL HIGH (ref 70–99)
Glucose-Capillary: 128 mg/dL — ABNORMAL HIGH (ref 70–99)

## 2020-12-05 MED ORDER — ACETAMINOPHEN 325 MG PO TABS: 650.0000 mg | ORAL_TABLET | Freq: Four times a day (QID) | ORAL | Status: DC | PRN

## 2020-12-05 NOTE — Progress Notes (Signed)
Occupational Therapy Session Note  Patient Details  Name: [name] MRN: [id number] Date of Birth: 01/12/1956  Today's Date: 12/05/2020 OT Individual Time: 0930-1000 OT Individual Time Calculation (min): 30 min   Session 2: 75 min missed d/t pt refusal    Short Term Goals: Week 3:  OT Short Term Goal 1 (Week 3): STG=LTG d/t ELOS  Skilled Therapeutic Interventions/Progress Updates:    Pt received supine with no c/o pain, agreeable to take shower. Pt very fast moving and impulsive throughout session, requiring quick guarding and cueing to remain safe. Pt completed bed mobility with min A, increased difficulty coming supine to sit. Pt doffed all clothing with CGA.  Pt used RW with mod cueing to complete ambulatory transfer into the bathroom with min guard. Pt voided urine in standing with close supervision. Transfer into shower onto TTB with CGA. Pt completed all bathing with close supervision and set up. Good bimanual manipulation of soap and washcloth. Pt transferred to EOB to dress. Min A to don pants, 2/2 poor sequencing of finding pant leg. Pt donned shirt with supervision. Pt abruptly returned to bed and declined any further participation, 15 min missed. Bed alarm set.   Session 2:  Entered room, pt supine with NT exiting. Pt stating immediately "i'm tired". OT presented several options for session, all of which pt adamantly declined. Eventually pt began ignoring OT and conversation attempts. Pt was left supine with all needs within reach, 75 min missed. Bed alarm set.   Therapy Documentation Precautions:  Precautions Precautions: Fall Precaution Comments: L hemiparesis, PEG Restrictions Weight Bearing Restrictions: No  Therapy/Group: Individual Therapy  Crissie Reese 12/05/2020, 6:59 AM

## 2020-12-05 NOTE — Progress Notes (Signed)
Speech Language Pathology Discharge Summary  Patient Details  Name: [name] MRN: [id number] Date of Birth: [birth year]  Today's Date: 12/05/2020 SLP Missed Time: 21 Minutes Missed Time Reason: Patient fatigue   Skilled Therapeutic Interventions:  Patient missed 30 minutes of skilled SLP intervention due to declining a snack of food or liquids. Patient reported he was not hungry and consumed minimal amounts of his breakfast. Continue with current plan of care.   Patient has met 3 of 3 long term goals.  Patient to discharge at overall Supervision level.   Reasons goals not met: N/A   Clinical Impression/Discharge Summary: Patient has made functional gains and has met 3 of 3 LTGs this admission. Currently, patient is consuming Dys. 2 textures with thin liquids (baseline diet when dentures aren't available) with minimal overt s/s of aspiration and requires overall supervision level verbal cues for use of swallowing compensatory strategies. Patient and caregiver education is complete and patient will be discharged from skilled SLP intervention due to being at baseline swallowing function.  Patient will discharge back to his group home with 24 hour supervision and skilled SLP f/u is not warranted at this time.   Care Partner:  Caregiver Able to Provide Assistance: Yes  Type of Caregiver Assistance: Physical;Cognitive  Recommendation:  24 hour supervision/assistance;None     Equipment: N/A   Reasons for discharge: Treatment goals met   Patient/Family Agrees with Progress Made and Goals Achieved: Yes    Tiffeny Minchew 12/05/2020, 8:42 AM

## 2020-12-05 NOTE — Progress Notes (Addendum)
Patient ID: CHENG DEC, male   DOB: Apr 19, 1956, 64 y.o.   MRN: [id number]  SW returned phone call to [name] to follow-up after family edu on Friday, however, she stated that she would call back as currently dealing with a family situation (preparing for funeral).  *SW spoke with [name] and informed on if there is DME pt requires, will order items needed. SW informed lift chair was not recommended. Repots she received updates from Risk management indicating dentures would be covered, and someone would follow-up with SW. SW informed risk management will not follow-up as SW is no longer involved after making initial report. SW also stated no updates on status of LOC status and when pt can return to [facility] group home. Reports she will follow-up with Tabitha/RN with [facility] to get updates.   Cecile Sheerer, MSW, LCSWA Office: [phone] Cell: [phone] Fax: [phone]

## 2020-12-05 NOTE — Progress Notes (Signed)
Physical Therapy Session Note  Patient Details  Name: [name] MRN: [id number] Date of Birth: 02-Mar-1956  Today's Date: 12/05/2020 PT Individual Time: 1105-1155 PT Individual Time Calculation (min): 50 min   Short Term Goals: Week 3:  PT Short Term Goal 1 (Week 3): Patient will perform dynamic balance activities in standing >10 min. PT Short Term Goal 2 (Week 3): Patient will ambulate >150 ft with close supervision for safety using LRAD. PT Short Term Goal 3 (Week 3): Patient will improve his score on the Berg Balance Scale to >36/56 to indicate reduced fall risk with standing activities.  Skilled Therapeutic Interventions/Progress Updates:     Patient in bed upon PT arrival. Patient alert and agreeable to PT session. Patient denied pain during session.  Therapeutic Activity: Bed Mobility: Patient performed supine to/from sit with supervision due to impulsivity. Provided verbal cues for controlled movements. Transfers: Patient performed sit to/from stand x8 with supervision with and without RW. Provided verbal cues for hand placement on RW and reaching back to sit to control descent. Patient performed ambulatory transfers to the bathroom x2 using RW with L hand splint, stated he needed to void, but was unsuccessful both trials, required CGA-min A for transfers and heavy cues for safety awareness with AD, often pushes it aside or bumps into it on the L when walking. Performed hand hygiene in standing at the sink x2 with close supervision.   Gait Training:  Patient ambulated 100-150 feet x4 using RW with L hand splint with CGA-min A due to L foot catching on L side of RW or poor L foot clearance. Ambulated with L circumduction gait, decreased L attention, decreased L knee/hip flexion and DF during swing, and increased gait speed due to impulsivity. Provided verbal cues for slow, controlled gait speed, provided visual target (blue band) tied to RW for L foot to improved swing through  versus circumduction, provided cues for safe use of AD, and increased L hamstring activation in pre-swing for improved foot clearance. Deviations improved but intermittently present with repetition and visual target for L foot.  Neuromuscular Re-ed: Patient performed the following balance activities: -static standing x2 min with supervision -360 degree turns with close supervision  -standing grasping 2 red and 2 yellow large clothes pins with his L hand and placing them on then removing them from a metal bar at waist height with close supervision for balance and intermittent assist from his R hand for management of pins, required significant time to complete task -step-ups on 6" step with R hand rail with CGA leading with L foot for strengthening and increased hamstring activation with foot placement, required heavy cues and demonstration to perform task, often going up the stairs rather than only stepping up/down 1 step  Patient requesting to return to the room to lay down. Stated he wanted to lie down, denied fatigue, but intermittently closing his eyes in sitting. Patient returned to the room due to declining further participation in therapeutic activities. Patient in bed at end of session with breaks locked, bed alarm set, and all needs within reach. Patient missed 10 min of skilled PT due to fatigue/refusal, RN made aware. Will attempt to make-up missed time as able.      Therapy Documentation Precautions:  Precautions Precautions: Fall Precaution Comments: L hemiparesis, PEG Restrictions Weight Bearing Restrictions: No General: PT Amount of Missed Time (min): 10 Minutes PT Missed Treatment Reason: Patient unwilling to participate;Patient fatigue   Therapy/Group: Individual Therapy  [name] L [name] PT,  DPT  12/05/2020, 5:31 PM

## 2020-12-05 NOTE — Progress Notes (Signed)
[facility] PHYSICAL MEDICINE & REHABILITATION PROGRESS NOTE   Subjective/Complaints: Slept pretty well. No new problems this morning  ROS: Limited due to cognitive/behavioral   Objective:   No results found. No results for input(s): WBC, HGB, HCT, PLT in the last 72 hours. No results for input(s): NA, K, CL, CO2, GLUCOSE, BUN, CREATININE, CALCIUM in the last 72 hours.  Intake/Output Summary (Last 24 hours) at 12/05/2020 1037 Last data filed at 12/04/2020 1810 Gross per 24 hour  Intake 222 ml  Output --  Net 222 ml   Physical Exam: Vital Signs Blood pressure 107/65, pulse 73, temperature (!) 97.5 F (36.4 C), temperature source Oral, resp. rate 16, height 5\' 4"  (1.626 m), weight 67.9 kg, SpO2 96 %.  Constitutional: No distress . Vital signs reviewed. HEENT: EOMI, oral membranes moist Neck: supple Cardiovascular: RRR without murmur. No JVD    Respiratory/Chest: CTA Bilaterally without wheezes or rales. Normal effort    GI/Abdomen: BS +, non-tender, non-distended, PEG site intact Ext: no clubbing, cyanosis, or edema Psych: pleasant and generally cooperative Musc: No edema in extremities.  No tenderness in extremities. Neuro: Alert Dysarthric, follows all commands  Motor: RUE/RLE: 4+/5 proximal distal LUE: 3+ to 4-/5 proximal to distal with apraxia--no change LLE: 4/5 hip flexion, knee extension, 1/5 ankle dorsiflexion-no changes   Assessment/Plan: 1. Functional deficits which require 3+ hours per day of interdisciplinary therapy in a comprehensive inpatient rehab setting.  Physiatrist is providing close team supervision and 24 hour management of active medical problems listed below.  Physiatrist and rehab team continue to assess barriers to discharge/monitor patient progress toward functional and medical goals  Care Tool:  Bathing    Body parts bathed by patient: Left arm,Chest,Abdomen,Front perineal area,Right upper leg,Left upper leg,Face,Buttocks,Right lower  leg,Left lower leg,Right arm   Body parts bathed by helper: Right arm,Left arm     Bathing assist Assist Level: Minimal Assistance - Patient > 75%     Upper Body Dressing/Undressing Upper body dressing   What is the patient wearing?: Pull over shirt    Upper body assist Assist Level: Minimal Assistance - Patient > 75%    Lower Body Dressing/Undressing Lower body dressing      What is the patient wearing?: Underwear/pull up,Pants     Lower body assist Assist for lower body dressing: Contact Guard/Touching assist     Toileting Toileting    Toileting assist Assist for toileting: Contact Guard/Touching assist     Transfers Chair/bed transfer  Transfers assist     Chair/bed transfer assist level: Supervision/Verbal cueing Chair/bed transfer assistive device:   Ambulation assist      Assist level: Minimal Assistance - Patient > 75% Assistive device: Walker-rolling Max distance: 290ft   Walk 10 feet activity   Assist     Assist level: Minimal Assistance - Patient > 75% Assistive device: Walker-rolling   Walk 50 feet activity   Assist Walk 50 feet with 2 turns activity did not occur: Safety/medical concerns (fatigue, weakness, decreased balance/postural control)  Assist level: Minimal Assistance - Patient > 75% Assistive device: Walker-rolling    Walk 150 feet activity   Assist Walk 150 feet activity did not occur: Safety/medical concerns (fatigue, weakness, decreased balance/postural control)  Assist level: Minimal Assistance - Patient > 75% Assistive device: Walker-rolling    Walk 10 feet on uneven surface  activity   Assist Walk 10 feet on uneven surfaces activity did not occur: Safety/medical concerns (fatigue, weakness, decreased balance/postural control, poor  safety awareness)         Wheelchair     Assist Will patient use wheelchair at discharge?: Yes Type of Wheelchair: Manual Wheelchair  activity did not occur: Refused  Wheelchair assist level: Moderate Assistance - Patient 50 - 74% Max wheelchair distance: 150    Wheelchair 50 feet with 2 turns activity    Assist        Assist Level: Moderate Assistance - Patient 50 - 74%   Wheelchair 150 feet activity     Assist      Assist Level: Moderate Assistance - Patient 50 - 74%   Blood pressure 107/65, pulse 73, temperature (!) 97.5 F (36.4 C), temperature source Oral, resp. rate 16, height 5\' 4"  (1.626 m), weight 67.9 kg, SpO2 96 %.  Medical Problem List and Plan: 1.  Decreased functional mobility secondary to acute hypoxic hypercapnic respiratory failure with aspiration/ventilatory support pneumonia  Continue CIR---> plan is to return to Group home  -I have spoken with sister at length re: his HR and seizure history, plans for return to [facility]. 2.  Antithrombotics: -DVT/anticoagulation: Subcutaneous heparin             -antiplatelet therapy: N/A 3. Pain Management: Denies pain. Not using oxycodone- will d/c 4. Mood: no SSRIs             -antipsychotic agents: Seroquel 400 mg twice daily 5. Neuropsych: This patient is not capable of making decisions on his own behalf. 6. Skin/Wound Care: Local care to G/J tube 7. Fluids/Electrolytes/Nutrition:  Prealbumin 34  -dc'ed TF and H20 flushes  100% p.o. intake  12/13 nutrition much improved 8.  Paraesophageal hernia/massively dilated esophagus.  Status post hernia repair percutaneous gastrostomy tube placement 10/26/2020 per cardiothoracic surgery Dr. 13/02/2020 changed to placement of jejunal arm via indwelling gastrostomy tube 11/07/2020 per interventional radiology.    -Continue Protonix twice daily, MiraLAX twice daily, Colace twice daily.    -remove GJtube as outpt 9.  Iron deficiency anemia.    Hemoglobin 11.0 on 12/3  Continue to monitor 10.  Schizophrenia/intellectual delay/seizure disorder.  Valproic acid 250 mg twice daily, Lamictal 100 mg daily, seroquel  400mg  bid  12/9 Had seizure like episode today, no post-ictal symptoms   -last vpa level on 11/10 was low--increased to 500mg  bid 12/9  12/13 -re-check level 12/14    -sister told me that VPA level was checked frequently at home 11.  Hypothyroidism.  Synthroid 12.  Type 2 diabetes mellitus with hyperglycemia  .  Hemoglobin A1c 5.8.  SSI.  Continue SSI  Changed SSI to coincide with meals  CBGs controlled 12/13 13.  Hyperlipidemia.  Continue Crestor 20mg  daily 14.  History of CVA with left-sided residual weakness continue therapies as directed 15.  Dysphagia  HS TF as above  CT angio chest personally reviewed-esophagus looks relatively stable, esophagram unremarkable for leaks  -D2 thins diet recommended after MBS 11/30 16.  Hyponatremia  Sodium 134 12/3, 135 on 12/10, repeat tomorrow  17. Cards: tachycardia --- resolved  -EKG12/3  with LVH, sinus tachy  -troponins normal x 2  -repeat 2D echo per cardiology--really unremarkable  -tachy with activity, 120's --added low dose lopressor 12/7  12/9 HR still somewhat elevated. Increased lopressor to 25mg  bid  12/10 HR up to 100-110 with activity with therapy currently.    Continue to observe  12/13: HR stable at rest- monitor TID LOS: 20 days A FACE TO FACE EVALUATION WAS PERFORMED  14/7 12/05/2020, 10:37 AM

## 2020-12-06 ENCOUNTER — Inpatient Hospital Stay (HOSPITAL_COMMUNITY): Payer: Medicare Other

## 2020-12-06 LAB — GLUCOSE, CAPILLARY
Glucose-Capillary: 127 mg/dL — ABNORMAL HIGH (ref 70–99)
Glucose-Capillary: 142 mg/dL — ABNORMAL HIGH (ref 70–99)
Glucose-Capillary: 103 mg/dL — ABNORMAL HIGH (ref 70–99)
Glucose-Capillary: 120 mg/dL — ABNORMAL HIGH (ref 70–99)

## 2020-12-06 LAB — COMPREHENSIVE METABOLIC PANEL
ALT: 16 U/L (ref 0–44)
AST: 18 U/L (ref 15–41)
Albumin: 3.2 g/dL — ABNORMAL LOW (ref 3.5–5.0)
Alkaline Phosphatase: 71 U/L (ref 38–126)
Anion gap: 11 (ref 5–15)
BUN: 11 mg/dL (ref 8–23)
CO2: 27 mmol/L (ref 22–32)
Calcium: 9.5 mg/dL (ref 8.9–10.3)
Chloride: 98 mmol/L (ref 98–111)
Creatinine, Ser: 1.2 mg/dL (ref 0.61–1.24)
GFR, Estimated: >60 mL/min (ref >60)
Glucose, Bld: 123 mg/dL — ABNORMAL HIGH (ref 70–99)
Potassium: 4.5 mmol/L (ref 3.5–5.1)
Sodium: 136 mmol/L (ref 135–145)
Total Bilirubin: 0.2 mg/dL — ABNORMAL LOW (ref 0.3–1.2)
Total Protein: 7.4 g/dL (ref 6.5–8.1)

## 2020-12-06 LAB — SARS CORONAVIRUS 2 BY RT PCR (HOSPITAL ORDER, PERFORMED IN ~~LOC~~ HOSPITAL LAB): SARS Coronavirus 2: NEGATIVE

## 2020-12-06 LAB — VALPROIC ACID LEVEL: Valproic Acid Lvl: 89 ug/mL (ref 50.0–100.0)

## 2020-12-06 MED ORDER — BOOST / RESOURCE BREEZE PO LIQD CUSTOM
1.0000 | Freq: Three times a day (TID) | ORAL | Status: DC
  Administered 2020-12-06 – 2020-12-07 (×3): 1 via ORAL

## 2020-12-06 MED ORDER — POLYETHYLENE GLYCOL 3350 17 G PO PACK: 17.0000 g | PACK | Freq: Two times a day (BID) | ORAL | 0 refills | Status: AC

## 2020-12-06 MED ORDER — LEVOTHYROXINE SODIUM 75 MCG PO TABS: 75.0000 ug | ORAL_TABLET | Freq: Every day | ORAL | 0 refills | Status: DC

## 2020-12-06 MED ORDER — PANTOPRAZOLE SODIUM 40 MG PO TBEC: 40.0000 mg | DELAYED_RELEASE_TABLET | Freq: Two times a day (BID) | ORAL | 0 refills | Status: DC

## 2020-12-06 MED ORDER — ROSUVASTATIN CALCIUM 20 MG PO TABS: 20.0000 mg | ORAL_TABLET | Freq: Every day | ORAL | 0 refills | Status: DC

## 2020-12-06 MED ORDER — QUETIAPINE FUMARATE 400 MG PO TABS: 400.0000 mg | ORAL_TABLET | Freq: Two times a day (BID) | ORAL | 0 refills | Status: DC

## 2020-12-06 MED ORDER — DIVALPROEX SODIUM 500 MG PO DR TAB: 500.0000 mg | DELAYED_RELEASE_TABLET | Freq: Two times a day (BID) | ORAL | 0 refills | Status: DC

## 2020-12-06 MED ORDER — LAMOTRIGINE 100 MG PO TABS: 100.0000 mg | ORAL_TABLET | Freq: Every day | ORAL | 0 refills | Status: DC

## 2020-12-06 MED ORDER — VITAMIN D 50 MCG (2000 UT) PO TABS: 2000.0000 [IU] | ORAL_TABLET | Freq: Every day | ORAL | 0 refills | Status: DC

## 2020-12-06 MED ORDER — METOPROLOL TARTRATE 25 MG PO TABS: 25.0000 mg | ORAL_TABLET | Freq: Two times a day (BID) | ORAL | 0 refills | Status: DC

## 2020-12-06 MED ORDER — DOCUSATE SODIUM 100 MG PO CAPS: 100.0000 mg | ORAL_CAPSULE | Freq: Two times a day (BID) | ORAL | 0 refills | Status: DC

## 2020-12-06 MED ORDER — ACETAMINOPHEN 325 MG PO TABS: 650.0000 mg | ORAL_TABLET | Freq: Four times a day (QID) | ORAL | Status: DC | PRN

## 2020-12-06 NOTE — Progress Notes (Signed)
[facility] PHYSICAL MEDICINE & REHABILITATION PROGRESS NOTE   Subjective/Complaints: Lying in bed. Says he slept ok. Doing well because of good care here!  ROS: Limited due to cognitive/behavioral   Objective:   No results found. No results for input(s): WBC, HGB, HCT, PLT in the last 72 hours. Recent Labs    12/06/20 0502  NA 136  K 4.5  CL 98  CO2 27  GLUCOSE 123*  BUN 11  CREATININE 1.20  CALCIUM 9.5    Intake/Output Summary (Last 24 hours) at 12/06/2020 0953 Last data filed at 12/06/2020 0813 Gross per 24 hour  Intake 358 ml  Output --  Net 358 ml   Physical Exam: Vital Signs Blood pressure 116/68, pulse 90, temperature 97.9 F (36.6 C), resp. rate 14, height 5\' 4"  (1.626 m), weight 67.9 kg, SpO2 96 %.  Constitutional: No distress . Vital signs reviewed. HEENT: EOMI, oral membranes moist Neck: supple Cardiovascular: RRR without murmur. No JVD    Respiratory/Chest: CTA Bilaterally without wheezes or rales. Normal effort    GI/Abdomen: BS +, non-tender, non-distended, PEG sutured to stomach Ext: no clubbing, cyanosis, or edema Psych: pleasant and cooperative Musc: No edema in extremities.  No tenderness in extremities. Neuro: Alert dysarthric  Motor: RUE/RLE: 4+/5 proximal distal LUE: 3+ to 4-/5 proximal to distal with apraxia--no change LLE: 4/5 hip flexion, knee extension, 1/5 ankle dorsiflexion-no changes   Assessment/Plan: 1. Functional deficits which require 3+ hours per day of interdisciplinary therapy in a comprehensive inpatient rehab setting.  Physiatrist is providing close team supervision and 24 hour management of active medical problems listed below.  Physiatrist and rehab team continue to assess barriers to discharge/monitor patient progress toward functional and medical goals  Care Tool:  Bathing    Body parts bathed by patient: Left arm,Chest,Abdomen,Front perineal area,Right upper leg,Left upper leg,Face,Buttocks,Right lower leg,Left  lower leg,Right arm   Body parts bathed by helper: Right arm,Left arm     Bathing assist Assist Level: Minimal Assistance - Patient > 75%     Upper Body Dressing/Undressing Upper body dressing   What is the patient wearing?: Pull over shirt    Upper body assist Assist Level: Minimal Assistance - Patient > 75%    Lower Body Dressing/Undressing Lower body dressing      What is the patient wearing?: Underwear/pull up,Pants     Lower body assist Assist for lower body dressing: Contact Guard/Touching assist     Toileting Toileting    Toileting assist Assist for toileting: Contact Guard/Touching assist     Transfers Chair/bed transfer  Transfers assist     Chair/bed transfer assist level: Supervision/Verbal cueing Chair/bed transfer assistive device:   Ambulation assist      Assist level: Minimal Assistance - Patient > 75% Assistive device: Walker-rolling Max distance: 215ft   Walk 10 feet activity   Assist     Assist level: Minimal Assistance - Patient > 75% Assistive device: Walker-rolling   Walk 50 feet activity   Assist Walk 50 feet with 2 turns activity did not occur: Safety/medical concerns (fatigue, weakness, decreased balance/postural control)  Assist level: Minimal Assistance - Patient > 75% Assistive device: Walker-rolling    Walk 150 feet activity   Assist Walk 150 feet activity did not occur: Safety/medical concerns (fatigue, weakness, decreased balance/postural control)  Assist level: Minimal Assistance - Patient > 75% Assistive device: Walker-rolling    Walk 10 feet on uneven surface  activity   Assist Walk 10 feet  on uneven surfaces activity did not occur: Safety/medical concerns (fatigue, weakness, decreased balance/postural control, poor safety awareness)         Wheelchair     Assist Will patient use wheelchair at discharge?: Yes Type of Wheelchair: Manual Wheelchair activity did  not occur: Refused  Wheelchair assist level: Moderate Assistance - Patient 50 - 74% Max wheelchair distance: 150    Wheelchair 50 feet with 2 turns activity    Assist        Assist Level: Moderate Assistance - Patient 50 - 74%   Wheelchair 150 feet activity     Assist      Assist Level: Moderate Assistance - Patient 50 - 74%   Blood pressure 116/68, pulse 90, temperature 97.9 F (36.6 C), resp. rate 14, height 5\' 4"  (1.626 m), weight 67.9 kg, SpO2 96 %.  Medical Problem List and Plan: 1.  Decreased functional mobility secondary to acute hypoxic hypercapnic respiratory failure with aspiration/ventilatory support pneumonia  Continue CIR---> awaiting return to group home  -team conference today 2.  Antithrombotics: -DVT/anticoagulation: Subcutaneous heparin             -antiplatelet therapy: N/A 3. Pain Management: Denies pain. Not using oxycodone- will d/c 4. Mood: no SSRIs             -antipsychotic agents: Seroquel 400 mg twice daily 5. Neuropsych: This patient is not capable of making decisions on his own behalf. 6. Skin/Wound Care: Local care to G/J tube 7. Fluids/Electrolytes/Nutrition:  Prealbumin 34  Only maintenance G/J flushes  100% p.o. intake    nutrition much improved 8.  Paraesophageal hernia/massively dilated esophagus.  Status post hernia repair percutaneous gastrostomy tube placement 10/26/2020 per cardiothoracic surgery Dr. 13/02/2020 changed to placement of jejunal arm via indwelling gastrostomy tube 11/07/2020 per interventional radiology.    -Continue Protonix twice daily, MiraLAX twice daily, Colace twice daily.    -since he's been here so long, will inquire about removal prior to discharge 9.  Iron deficiency anemia.    Hemoglobin 11.0 on 12/3  Continue to monitor 10.  Schizophrenia/intellectual delay/seizure disorder.  Valproic acid 250 mg twice daily, Lamictal 100 mg daily, seroquel 400mg  bid  12/9 Had seizure like episode today, no post-ictal  symptoms   -last vpa level on 11/10 was low--increased to 500mg  bid 12/9  12/14 vpa level today high-therapeutic   -sister told me that VPA level was checked frequently at home   -probably check again later this week or early next to make sure level doesn't climb too high 11.  Hypothyroidism.  Synthroid 12.  Type 2 diabetes mellitus with hyperglycemia  .  Hemoglobin A1c 5.8.  SSI.  Continue SSI  Changed SSI to coincide with meals  CBGs controlled 12/14 13.  Hyperlipidemia.  Continue Crestor 20mg  daily 14.  History of CVA with left-sided residual weakness continue therapies as directed 15.  Dysphagia  HS TF as above  CT angio chest personally reviewed-esophagus looks relatively stable, esophagram unremarkable for leaks  -D2 thins diet recommended after MBS 11/30 16.  Hyponatremia  Sodium 134 12/3, 135 on 12/10, repeat tomorrow  17. Cards: tachycardia --- resolved  -EKG12/3  with LVH, sinus tachy  -troponins normal x 2  -repeat 2D echo per cardiology--really unremarkable  -tachy with activity, 120's --added low dose lopressor 12/7  12/9 HR still somewhat elevated. Increased lopressor to 25mg  bid  12/10 HR up to 100-110 with activity with therapy currently.    Continue to observe  12/13-14: HR stable  at rest- will review with team today in TC LOS: 21 days A FACE TO FACE EVALUATION WAS PERFORMED  Ranelle Oyster 12/06/2020, 9:53 AM

## 2020-12-06 NOTE — Progress Notes (Signed)
Patient ID: [name], male   DOB: 11/10/1956, 64 y.o.   MRN: [id number]  SWleft message forTabitha RN with [facility] [phone]) to follow-up on status of LOC. SW waiting on follow-up.   *SW received a message from Gaylord with [facility] [phone]) reporting that pt was cleared to return to group home and they would like to pick up pt tomorrow at 10am. SW encouraged to follow-up with a QP or Nurse to discuss further. SW received a message from pt sister [name] [phone]) reporting above,and that upon leaving the group home he is to get his dentures. She states she intends to call and schedule this appointment. She also stated she will be at a funeral and not free until after 4pm. SW received a second message indicating the VP of [facility] Vicki Mallet stated LOC approved, and pt is free to return.  SW returned phone call to [facility] at 11:33am and was told to leave message for [place]. SW left message informing that SW will speak with attending to get clearance pt can d/c tomorrow as there were plans to see if peg could be removed prior to discharge. SW informed will call back once out of meeting.   SW received updates from medical team reporting peg will not be removed until January. Attending agreeable to pt to d/c tomorrow. Reports he will explore notes to support pt requires an elevated mattress.   1:33pm- SW spoke with Teresa/RN with [facility] covering to inform pt can d/c tomorrow after therapies at 4pm or Thursday at 10am; She spoke with VP Vicki Mallet and pick up time at 4pm on Wednesday (12/15). SW to fax d/c summary and AVS to #[phone].   1:52pm- SW left message for pt sister [name] to inform on above, and stated will get orders for mattress.   3:48pm-SW received call from Teresa/RN with [facility] who stated they are requesting COVID test prior to admission. SW asked PA Pam to order.   4:28pm-SW left additional message informing on outpatient therapies only PT/OT as pt d/c from [facility]; and  informed on transportation pick up tomorrow at 4pm. SW reported will discuss further tomorrow.   Cecile Sheerer, MSW, LCSWA Office: [phone] Cell: [phone] Fax: [phone]

## 2020-12-06 NOTE — Progress Notes (Signed)
Physical Therapy Weekly Progress Note  Patient Details  Name: [name] MRN: [id number] Date of Birth: 05-18-1956  Beginning of progress report period: November 29, 2020 End of progress report period: December 06, 2020  Today's Date: 12/06/2020 PT Individual Time: 0900-1000 PT Individual Time Calculation (min): 60 min   Patient has met 1 and partially met 2 of 3 short term goals.  Patient with variable progress this week. Performs bed mobility and transfers with supervision, and continues to require CGA with ambulation due to L toe catching with or without the RW, able to self correct without additional assistance today. HR elevated with mobility early in the week limiting activity tolerance, improved throughout the week.  Patient continues to demonstrate the following deficits muscle weakness and muscle joint tightness, decreased cardiorespiratoy endurance, abnormal tone and decreased coordination and decreased standing balance, decreased postural control, hemiplegia and decreased balance strategies and therefore will continue to benefit from skilled PT intervention to increase functional independence with mobility.  Patient not progressing toward long term goals.  See goal revision..  Plan of care revisions: downgraded ambulaiton goals to CGA due to L toe catching and decreased safety with a new AD.Marland Kitchen  PT Short Term Goals Week 3:  PT Short Term Goal 1 (Week 3): Patient will perform dynamic balance activities in standing >10 min. PT Short Term Goal 1 - Progress (Week 3): Met PT Short Term Goal 2 (Week 3): Patient will ambulate >150 ft with close supervision for safety using LRAD. PT Short Term Goal 2 - Progress (Week 3): Partly met (Intermittent supervision >150 ft with RW and L hand splint, requires CGA overall due to L toe catching and decreased safety awareness with new AD.) PT Short Term Goal 3 (Week 3): Patient will improve his score on the Berg Balance Scale to >36/56 to  indicate reduced fall risk with standing activities. PT Short Term Goal 3 - Progress (Week 3): Partly met (Socred 36/56 demonstrating a 9 point increase from last week.) Week 4:  PT Short Term Goal 1 (Week 4): STG=LTG due to ELOS.  Skilled Therapeutic Interventions/Progress Updates:     Patient in bed upon PT arrival. Patient alert and agreeable to PT session. Patient denied pain during session.  Therapeutic Activity: Bed Mobility: Patient performed supine to/from sit with supervision, required increased effort and cues with 2 attempts to come to sitting today. Provided verbal cues for rolling to his L side and R hand placement to push up to sitting. Patient donned B tennis shoes independently sitting EOB, L shoe with heel lift to prevent extensor thrust with gait.  Transfers: Patient performed sit to/from stand with supervision with and without RW and L hand splint throughout session. With cues patient was able to manage the strap for the L hand splint during session. Patient performed an ambulatory transfer to the bathroom with CGA using RW. Required cues for safe use of RW with gait and transfers. Patient was continent of bowl and bladder during toileting. Performed peri-care and lower body clothing management with mod I for increased time. Performed hand hygiene at the sink with supervision for cues to wash all soap off before turning off the water.   Gait Training:  Patient ambulated >150 feet x2 using RW with L hand splint with CGA-close supervision, utilized blue band tied to RW as target for L foot to step into the RW. Ambulated with decreased L circumduction gait today, improved swing through with L hip/knee flexion in swing with visual target,  continues to have intermittent L toe catching, but patient able to self correct with CGA with RW. Provided verbal cues for attending to gait with increased stimulation and on turns for safety, focus on hitting visual target with L foot for improved foot  clearance and reduced circumduction of L foot in swing, patient hit L side of RW only 2x throughout gait training.  Neuromuscular Re-ed: Patient performed the following standardized assessments: Patient demonstrates increased fall risk as noted by score of 36/56 on Berg Balance Scale.  (<36= high risk for falls, close to 100%; 37-45 significant >80%; 46-51 moderate >50%; 52-55 lower >25%) Educated patient on interpretation and results of standardized assessment. -dynamic standing balance x10 min 12 sec placing large connect four pieces into the large connect 4 board, then replacing them to the poles while reaching down to pick them up from the floor, then placing them in the board again -donned B gloves with max A -standing balance with functional task x6 min picking up large connect four pieces and wiping them with his L hand, required cues for thoroughness, then placing them back on the poles by the connect four board  Patient in bed, per patient request, as he declined all offers of sitting OOB, at end of session with breaks locked, bed alarm set, and all needs within reach.    Therapy Documentation Precautions:  Precautions Precautions: Fall Precaution Comments: L hemiparesis, PEG Restrictions Weight Bearing Restrictions: No  Balance: Standardized Balance Assessment Standardized Balance Assessment: Berg Balance Test Berg Balance Test Sit to Stand: Able to stand without using hands and stabilize independently Standing Unsupported: Able to stand safely 2 minutes Sitting with Back Unsupported but Feet Supported on Floor or Stool: Able to sit safely and securely 2 minutes Stand to Sit: Controls descent by using hands Transfers: Able to transfer safely, definite need of hands Standing Unsupported with Eyes Closed: Able to stand 10 seconds safely Standing Ubsupported with Feet Together: Able to place feet together independently but unable to hold for 30 seconds From Standing, Reach  Forward with Outstretched Arm: Can reach forward >12 cm safely (5") From Standing Position, Pick up Object from Floor: Able to pick up shoe, needs supervision From Standing Position, Turn to Look Behind Over each Shoulder: Turn sideways only but maintains balance Turn 360 Degrees: Needs close supervision or verbal cueing Standing Unsupported, Alternately Place Feet on Step/Stool: Able to complete >2 steps/needs minimal assist Standing Unsupported, One Foot in Front: Needs help to step but can hold 15 seconds Standing on One Leg: Tries to lift leg/unable to hold 3 seconds but remains standing independently Total Score: 36/56   Therapy/Group: Individual Therapy  Paige Monarrez L Abie Cheek PT, DPT  12/06/2020, 12:43 PM

## 2020-12-06 NOTE — Progress Notes (Signed)
Nutrition Follow-up  DOCUMENTATION CODES:   Not applicable  INTERVENTION:   - Boost Breeze po TID, each supplement provides 250 kcal and 9 grams of protein  - d/c Ensure Enlive due to pt preference  - Encouraged continued adequate PO intake  NUTRITION DIAGNOSIS:   Inadequate oral intake related to dysphagia as evidenced by NPO status.  Progressing, pt now on dysphagia 2 diet with thin liquids  GOAL:   Patient will meet greater than or equal to 90% of their needs  Progressing  MONITOR:   PO intake, Supplement acceptance, Labs, Weight trends  REASON FOR ASSESSMENT:   Consult Enteral/tube feeding initiation and management  ASSESSMENT:   64 year old male with PMH of iron deficiency anemia, T2DM, CVA, dysphagia, Barrett's esophagus, GERD, gastroparesis, HTN, HLD, intellectual disability residing at a group home, schizophrenia, seizure disorder, and hypothyroidism. Presented 10/20/20 after a fall. CT abdomen pelvis showed a large stable gastric hernia with associated esophageal dilatation and subsequent mass-effect on the trachea. Pt underwent EGD on 10/29 that showed dilation in the entire esophagus. On 10/30, pt developed significant respiratory distress and required intubation. Pt underwent hiatal hernia repair with G-tube placement by cardiothoracic surgery on 11/03. A jejunal arm was added to indwelling G-tube on 11/15 by IR. Pt was extubated 11/13. Pt remains NPO. Admitted to CIR on 11/23.  11/30 - MBS, diet advanced to dysphagia 2 with thin liquids 12/01 - transition to nocturnal TF 12/03 - nocturnal TF d/c  Spoke with pt at bedside. He reports that he is eating well. He continues to have a good appetite. He reports only one episode of reflux since admission to CIR. He denies any nausea/vomiting.  Pt reports that he does not like Ensure Enlive. He would like to try [employer]. RD to adjust supplement regimen.  CIR admit weight: 69.6 kg Current  weight: 67.9 kg  Meal Completion: 80-100%  Medications reviewed and include: colace, Ensure Enlive BID, free water flushes of 50 ml q 12 hours, lamictal, protonix, miralax  Labs reviewed. CBG's: 119-142 x 24 hours  NUTRITION - FOCUSED PHYSICAL EXAM:  Flowsheet Row Most Recent Value  Orbital Region No depletion  Upper Arm Region Mild depletion  Thoracic and Lumbar Region No depletion  Buccal Region No depletion  Temple Region No depletion  Clavicle Bone Region Mild depletion  Clavicle and Acromion Bone Region Mild depletion  Scapular Bone Region No depletion  Dorsal Hand Mild depletion  Patellar Region No depletion  Anterior Thigh Region Mild depletion  Posterior Calf Region No depletion  Edema (RD Assessment) None  Hair Reviewed  Eyes Reviewed  Mouth Reviewed  Skin Reviewed  Nails Reviewed       Diet Order:   Diet Order            DIET DYS 2 Room service appropriate? Yes; Fluid consistency: Thin  Diet effective now                 EDUCATION NEEDS:   No education needs have been identified at this time  Skin:  Skin Assessment: Skin Integrity Issues: Incisions: abdomen  Last BM:  12/05/20  Height:   Ht Readings from Last 1 Encounters:  11/19/20 5\' 4"  (1.626 m)    Weight:   Wt Readings from Last 1 Encounters:  12/05/20 67.9 kg    BMI:  Body mass index is 25.69 kg/m.  Estimated Nutritional Needs:   Kcal:  1900-2100  Protein:  95-110 grams  Fluid:  >/= 1.9 L  Gustavus [name], MS, RD, LDN Inpatient Clinical Dietitian Please see [facility] for contact information.

## 2020-12-06 NOTE — Progress Notes (Signed)
Occupational Therapy Session Note ° °Patient Details  °Name: [name] °MRN: [id number] °Date of Birth: 10/30/1956 ° °Today's Date: 12/06/2020 °OT Individual Time: 1030-1110 °OT Individual Time Calculation (min): 40 min  and Today's Date: 12/06/2020 °OT Missed Time: 20 Minutes °Missed Time Reason: Patient fatigue ° ° °Session 2:  °OT Individual Time: 1445-1500 °OT Individual Time Calculation (min): 15 min °OT Missed Time: 30 min ° °Short Term Goals: °Week 3:  OT Short Term Goal 1 (Week 3): STG=LTG d/t ELOS ° °Skilled Therapeutic Interventions/Progress Updates:  °  Pt received supine with no c/o pain, pt agreeable to take shower. Pt completed bed mobility with use of bed rail, overall supervision. Cueing required for slowing down when doffing clothes but pt appropriately sitting and using figure 4 technique to doff pants and socks. Pt used RW to complete ambulatory transfer into the bathroom with CGA- supervision (OT able to remove hands from pt to trial but feeling very unsafe d/t fast, impulsive movements). Pt transferred into shower and completed all bathing from TTB with close supervision. EOB pt completed UB dressing with min A, LB with min A, 2/2 using paper scrub clothes and increased difficulty with no stretch. Pt's clothes were collected into a linen bag and he completed 100 ft of functional mobility with CGA to the laundry room. He loaded his clothes into the washing machine with min A and required cueing/edu to start load. Pt returned to his room and immediately transferred supine. He refused to participate any further and missed the remaining 20 minutes of skilled OT. Encouraged PO intake and pt drank 1 OJ with supervision and cueing for slow pace. Discussed plan for afternoon session to hopefully encourage more participation. Bed alarm set, all needs within reach.  ° °Session 2:  °Pt received supine, adamantly declining any participation. OT returned 1 hr later and pt was calling to use the  bathroom. Pt completed bed mobility with min A. CGA for ambulatory transfer to the bathroom, using the RW. With poor L foot clearance and L inattention. Pt attempted to void urine but was unable. Pt then completed 100 ft of functional mobility to the laundry room, with CGA-min A 2/2 frequent kicking of the RW on the L side. Pt gathered items from the dryer with mod A. Pt returned to his room, attempted toileting again (no void). He returned to bed and was left supine with all needs met, bed alarm set. 30 min missed.  ° °Therapy Documentation °Precautions:  °Precautions °Precautions: Fall °Precaution Comments: L hemiparesis, PEG °Restrictions °Weight Bearing Restrictions: No ° ° °Therapy/Group: Individual Therapy ° ° H  °12/06/2020, 6:40 AM ° °

## 2020-12-06 NOTE — Discharge Summary (Signed)
Physician Discharge Summary  Patient ID: [name] MRN: [id number] DOB/AGE: 1956-09-14 64 y.o.  Admit date: 11/15/2020 Discharge date: 12/07/2020  Discharge Diagnoses:  Principal Problem:   Debility Active Problems:   Hyponatremia   Dysphagia   Seizures (HCC)   S/P percutaneous endoscopic gastrostomy (PEG) tube placement (HCC)   Dilation of esophagus DVT prophylaxis Iron deficiency anemia Schizophrenia/intellectual delay Hypothyroidism Hyperlipidemia History of CVA with left-sided residual weakness Type 2 diabetes mellitus  Discharged Condition: Stable  Significant Diagnostic Studies: CT ANGIO CHEST PE W OR WO CONTRAST  Result Date: 11/13/2020 CLINICAL DATA:  Respiratory failure. EXAM: CT ANGIOGRAPHY CHEST WITH CONTRAST TECHNIQUE: Multidetector CT imaging of the chest was performed using the standard protocol during bolus administration of intravenous contrast. Multiplanar CT image reconstructions and MIPs were obtained to evaluate the vascular anatomy. CONTRAST:  121m OMNIPAQUE IOHEXOL 350 MG/ML SOLN COMPARISON:  10/05/2007 FINDINGS: Cardiovascular: Contrast injection is sufficient to demonstrate satisfactory opacification of the pulmonary arteries to the segmental level. There is no pulmonary embolus or evidence of right heart strain. The size of the main pulmonary artery is normal. Heart size is normal, with no pericardial effusion. The course and caliber of the aorta are normal. There is no atherosclerotic calcification. Opacification decreased due to pulmonary arterial phase contrast bolus timing. Mediastinum/Nodes: -- No mediastinal lymphadenopathy. -- No hilar lymphadenopathy. -- No axillary lymphadenopathy. --there are few mildly enlarged bilateral supraclavicular lymph nodes of unknown clinical significance. -- Normal thyroid gland where visualized. -the esophagus is patulous and dilated. The patient appears to be status post prior hiatal hernia repair. There is a  residual small hiatal hernia. Lungs/Pleura: There is atelectasis at the lung bases bilaterally with trace bilateral pleural effusions. There is mucous plugging and bronchial wall thickening primarily at the lung bases. There is right apical pleuroparenchymal scarring. There is no pneumothorax. Upper Abdomen: Contrast bolus timing is not optimized for evaluation of the abdominal organs. The stomach is distended. There is a partially visualized gastrojejunostomy tube in place. Musculoskeletal: No chest wall abnormality. No bony spinal canal stenosis. Review of the MIP images confirms the above findings. IMPRESSION: 1. There is no evidence for acute pulmonary embolus or aortic dissection. 2. There is atelectasis at the lung bases bilaterally with trace bilateral pleural effusions. 3. There is mucous plugging and bronchial wall thickening primarily at the lung bases. Findings may be secondary to infectious or reactive bronchiolitis. 4. The esophagus is patulous and dilated. The patient appears to be status post prior hiatal hernia repair. There is a residual small hiatal hernia. 5. The stomach is distended. There is a partially visualized gastrojejunostomy tube in place. 6. Mildly enlarged bilateral supraclavicular lymph nodes of unknown clinical significance. Electronically Signed   By: CConstance HolsterM.D.   On: 11/13/2020 19:57   IR GASTR TUBE CONVERT GASTR-JEJ PER W/FL MOD SED  Result Date: 11/07/2020 INDICATION: 64year old male with history of recently surgically placed gastrostomy tube now with gastro paresis, therefore jejunal arm extension is requested for feeds. EXAM: CONVERT G-TUBE TO G-JTUBE MEDICATIONS: None. ANESTHESIA/SEDATION: None. CONTRAST:  158mOMNIPAQUE IOHEXOL 300 MG/ML SOLN - administered into the gastric lumen. FLUOROSCOPY TIME:  Fluoroscopy Time: 5 minutes 12 seconds (114 mGy). COMPLICATIONS: None immediate. PROCEDURE: Informed written consent was obtained from the patient after a  thorough discussion of the procedural risks, benefits and alternatives. All questions were addressed. Maximal Sterile Barrier Technique was utilized including caps, mask, sterile gowns, sterile gloves, sterile drape, hand hygiene and skin antiseptic. A timeout was performed  prior to the initiation of the procedure. Gentle hand injection via the indwelling gastrostomy demonstrated appropriate position within the gastric lumen. The excess length of the external portion of the gastrostomy tube was cut. A stiff Glidewire was advanced into the gastric lumen but unable to be position within the duodenum. Therefore, over the Glidewire a 7 French flexor angled tip sheath was placed was was directed through the pylorus into the first portion the duodenum. The Glidewire then was able to be passed to the proximal jejunum over which, in a coaxial fashion an angled tip catheter followed. The Glidewire was removed and exchanged for a stiff Amplatz wire. Over the Amplatz wire, a 9 [place] GJ conversion kit was inserted through the indwelling gastrostomy tube. Contrast injection demonstrated the tip of the gastro jejunal tube in the proximal jejunum, just past the ligament of Treitz. Additional injection through the indwelling gastrostomy port demonstrated appropriate flow into the gastric lumen. The ports were flushed with saline. The patient tolerated the procedure well without complication IMPRESSION: Successful insertion of a 9 French gastrojejunal limb through the indwelling gastrostomy tube. The tip of the jejunal arm is in the proximal jejunum, just distal to the ligament of Treitz. Ruthann Cancer, MD [facility] Electronically Signed   By: Ruthann Cancer MD   On: 11/07/2020 17:38   DG Chest Port 1 View  Result Date: 11/12/2020 CLINICAL DATA:  Loss of consciousness, cough EXAM: PORTABLE CHEST 1 VIEW COMPARISON:  Radiograph 11/04/2020 FINDINGS: Removal of a  previously seen endotracheal tube. Partial visualization of a percutaneous gastrojejunostomy tube in the upper abdomen. Telemetry leads overlie the chest. Lung volumes are low with streaky opacities favoring atelectatic changes and vascular crowding. No consolidative opacity is seen. No pneumothorax or visible effusion. The cardiomediastinal contours are unremarkable. No acute osseous or soft tissue abnormality. IMPRESSION: Low lung volumes with streaky opacities basilar opacities. Aspiration/airspace disease is not fully excluded. Electronically Signed   By: Lovena Le M.D.   On: 11/12/2020 19:48   ECHOCARDIOGRAM LIMITED  Result Date: 11/26/2020    ECHOCARDIOGRAM LIMITED REPORT   Patient Name:   [name] Date of Exam: 11/26/2020 Medical Rec #:  [id number]            Height:       64.0 in Accession #:    [id number]           Weight:       150.8 lb Date of Birth:  Aug 23, 1956           BSA:          1.735 m Patient Age:    19 years             BP:           116/74 mmHg Patient Gender: M                    HR:           107 bpm. Exam Location:  Inpatient Procedure: 2D Echo Indications:    abnormal ECG 794.31  History:        Patient has prior history of Echocardiogram examinations, most                 recent 10/21/2020.  Sonographer:    [name] Referring Phys: [id number] [place]  1. Left ventricular ejection fraction, by estimation, is 65 to 70%. The left ventricle has normal function. There  is mild left ventricular hypertrophy. Left ventricular diastolic parameters were normal.  2. Right ventricular systolic function is normal. The right ventricular size is normal. Tricuspid regurgitation signal is inadequate for assessing PA pressure.  3. The mitral valve is normal in structure. No evidence of mitral valve regurgitation.  4. The aortic valve is tricuspid. Aortic valve regurgitation is not visualized. No aortic stenosis is present.  5. The inferior vena cava is normal  in size with greater than 50% respiratory variability, suggesting right atrial pressure of 3 mmHg. FINDINGS  Left Ventricle: Left ventricular ejection fraction, by estimation, is 65 to 70%. The left ventricle has normal function. The left ventricular internal cavity size was normal in size. There is mild left ventricular hypertrophy. Left ventricular diastolic  parameters were normal. Right Ventricle: The right ventricular size is normal. Right vetricular wall thickness was not assessed. Right ventricular systolic function is normal. Tricuspid regurgitation signal is inadequate for assessing PA pressure. Pericardium: There is no evidence of pericardial effusion. Mitral Valve: The mitral valve is normal in structure. Aortic Valve: The aortic valve is tricuspid. Aortic valve regurgitation is not visualized. No aortic stenosis is present. Pulmonic Valve: The pulmonic valve was not well visualized. Pulmonic valve regurgitation is not visualized. Aorta: The aortic root is normal in size and structure. Venous: The inferior vena cava is normal in size with greater than 50% respiratory variability, suggesting right atrial pressure of 3 mmHg. IAS/Shunts: The interatrial septum was not well visualized. LEFT VENTRICLE PLAX 2D LVIDd:         3.60 cm  Diastology LVIDs:         2.20 cm  LV e' medial:    8.05 cm/s LV PW:         1.00 cm  LV E/e' medial:  8.6 LV IVS:        0.80 cm  LV e' lateral:   9.68 cm/s LVOT diam:     1.70 cm  LV E/e' lateral: 7.1 LVOT Area:     2.27 cm  IVC IVC diam: 1.70 cm LEFT ATRIUM         Index LA diam:    3.10 cm 1.79 cm/m   AORTA Ao Root diam: 2.70 cm MITRAL VALVE MV Area (PHT): 3.99 cm    SHUNTS MV Decel Time: 190 msec    Systemic Diam: 1.70 cm MV E velocity: 69.00 cm/s MV A velocity: 93.00 cm/s MV E/A ratio:  0.74 Oswaldo Milian MD Electronically signed by Oswaldo Milian MD Signature Date/Time: 11/26/2020/5:15:03 PM    Final    DG ESOPHAGUS W SINGLE CM (SOL OR THIN BA)  Result  Date: 11/18/2020 CLINICAL DATA:  History of hiatal hernia repair.  Evaluate for leak. EXAM: ESOPHAGUS/BARIUM SWALLOW/TABLET STUDY TECHNIQUE: Initial scout AP supine abdominal image obtained to insure adequate colon cleansing. Barium was introduced into the colon in a retrograde fashion and refluxed from the rectum to the cecum. Spot images of the colon followed by overhead radiographs were obtained. FLUOROSCOPY TIME:  Fluoroscopy Time:  1 minutes 18 seconds Radiation Exposure Index (if provided by the fluoroscopic device): 10.1 mGy Number of Acquired Spot Images: 0 COMPARISON:  None. FINDINGS: Patient is unable to stand. A modified, limited exam was performed with the patient in the semi upright LPO orientation. Initially several small sips of water-soluble contrast material was ingested by the patient. This passed through the esophagus and into the stomach without difficulty. No aspiration noted. The patient was then given thin barium which also  easily passed through the esophagus and into the stomach. No extravasation of contrast material identified to suggest postoperative leak. The distal esophagus appears increased in caliber and patulous. Fundoplication wrap appears intact. IMPRESSION: 1. No signs of postoperative leak status post hiatal hernia repair. Electronically Signed   By: Kerby Moors M.D.   On: 11/18/2020 14:02    Labs:  Basic Metabolic Panel: Recent Labs  Lab 12/02/20 0415 12/06/20 0502  NA 135 136  K 4.3 4.5  CL 99 98  CO2 24 27  GLUCOSE 127* 123*  BUN 14 11  CREATININE 1.09 1.20  CALCIUM 9.4 9.5    CBC: Recent Labs  Lab 12/02/20 0415  WBC 4.9  HGB 11.3*  HCT 34.9*  MCV 81.7  PLT 302    CBG: Recent Labs  Lab 12/05/20 2126 12/06/20 0624 12/06/20 1206 12/06/20 1637 12/06/20 2049  GLUCAP 128* 127* 142* 103* 120*   Family history.  Unavailable  Brief HPI:   REON HUNLEY is a 64 y.o. right-handed male with history of iron deficiency anemia type 2  diabetes mellitus CVA left-sided weakness dysphagia Barrett's esophagus GERD with gastroparesis hypertension hyperlipidemia schizophrenia with intellectual delay seizure disorder hypothyroidism.  Per chart review patient has been living in a group home since 2012.  He does have local family with good support.  Ambulated modified independent.  Presented 10/20/2020 after unwitnessed fall at group home facility question syncope related.  Patient subsequently had another syncopal episode with EMS reported to be pale and diaphoretic.  Admission chemistries glucose 163 BUN 37 creatinine 1.7 no blood cultures no growth to date procalcitonin 0.11 lactic acid 1.4 troponin XX 3 hemoglobin 8.2.  He was tachycardic heart rate 120-130 PO2 88% on room air.  Cranial CT scan negative.  Large area encephalomalacia involving the right frontal lobe.  Chest x-ray negative.  CT angiogram of chest negative for pulmonary emboli.  CT abdomen pelvis showed a large stable gastric hernia with associated esophageal dilation and subsequent mass-effect on the trachea.  Echocardiogram with ejection fraction of 70 to 75% no wall motion abnormalities grade 1 diastolic dysfunction.  EEG negative for seizure.  He underwent EGD 10/21/2020 per Dr. Carlean Purl that showed dilation in the entire esophagus.  Tortuous esophagus.  Esophageal mucosal changes secondary to established short segment Barrett's disease.  There was a 10 cm hiatal hernia 2 gastric polyps were biopsied.  On the early morning of 10/22/2020 rapid response patient developed significant respiratory distress subsequently intubated transferred to the ICU under critical care service.  He underwent hiatal hernia repair G-tube placement by cardiothoracic surgery 10/26/2020 change to jejunal arm via indwelling gastrostomy tube 11/07/2020 per interventional radiology.  He was extubated 11/05/2020.  During hospitalization developed pneumonia aspiration versus ventilatory associated pneumonia he did  complete a course of Unasyn followed by Zithromax.  Subcutaneous heparin for DVT prophylaxis.  Therapy evaluations completed and patient was admitted for a comprehensive rehab program   Hospital Course: [name] was admitted to rehab 11/15/2020 for inpatient therapies to consist of PT, ST and OT at least three hours five days a week. Past admission physiatrist, therapy team and rehab RN have worked together to provide customized collaborative inpatient rehab.  Pertaining to patient's acute hypoxic hypercapnic respiratory failure aspiration ventilatory support pneumonia completed course of antibiotics he was progressing nicely with overall therapies.  Subcutaneous heparin for DVT prophylaxis no bleeding episodes.  Mood stabilization he remained on Seroquel with documented history of schizophrenia and intellectual delay.  He continued  on Depakote as well as Lamictal for history of seizure EEG negative.  Hypothyroidism with Synthroid ongoing.  Type 2 diabetes mellitus hemoglobin A1c 5.8 blood sugars overall controlled he continued on Crestor for hyperlipidemia.  His diet was slowly advanced with dysphagia #2 thin liquids PEG tube nutritional support discontinue contact made interventional radiology PEG tube/J-tube would need to be remained in place for the next couple of weeks until follow-up with interventional radiology.  Blood pressure controlled mild tachycardia Lopressor as directed no chest pain or shortness of breath.   Blood pressures were monitored on TID basis and controlled  Diabetes has been monitored with ac/hs CBG checks and SSI was use prn for tighter BS control.    Rehab course: During patient's stay in rehab weekly team conferences were held to monitor patient's progress, set goals and discuss barriers to discharge. At admission, patient required minimal guard supine to sit minimal assist stand pivot transfers.  Shuffling gait 4 feet min assist  Physical exam.  Blood pressure  133/75 pulse 91 temperature 98.2 respirations 18 oxygen saturation 95% room air Constitutional.  No acute distress HEENT Head.  Normocephalic and atraumatic Eyes.  Pupils round and reactive to light no discharge without nystagmus Neck.  Supple nontender no JVD without thyromegaly Cardiac regular rate rhythm not extra sounds or murmur heard Abdomen.  Soft nontender positive bowel sounds PEG/J-tube in place Respiratory effort normal no respiratory distress without wheeze Musculoskeletal.  Normal range of motion Comments.  Right upper extremity 5/5 deltoids biceps triceps wrist extension grip and finger abduction Left upper extremity 3 -/5 in same muscles due to previous history of stroke Right lower extremity 5/5 hip flexors knee extension knee flexion dorsi and plantar flexion Left lower extremity 4 -/5 in same muscles Neurologic.  Alert no acute distress mentally challenged makes good eye contact with examiner provides name and age stated he was in the hospital follows simple commands  He/  has had improvement in activity tolerance, balance, postural control as well as ability to compensate for deficits. He/ has had improvement in functional use RUE/LUE  and RLE/LLE as well as improvement in awareness.  Patient performed sit to stand from stand x8 with supervision with and without rolling walker perform amatory transfers to the bathroom rolling walker ambulates 150 feet rolling walker contact-guard assist.  He was a bit impulsive needed some cues to slow down.  Completed bed mobility minimal assist doffed all clothing contact-guard assist.  Patient use rolling walker with moderate cueing to complete amatory transfers to the bathroom and minimal guard.  Transfers into the shower on TTB with contact-guard assist.  Completed all bathing with close supervision and set up.  Full teaching completed plan was to return back to group home       Disposition: Discharged to group home    Diet:  Dysphagia #2 thin liquids  Special Instructions: No driving smoking or alcohol  Follow-up interventional radiology in approximately 2 to 3 weeks for removal of J-tube [id number]  Medications at discharge 1.  Tylenol as needed 2.  Depakote 500 mg p.o. every 12 hours 3.  Colace 100 mg p.o. twice daily 4.  Lamictal 100 mg p.o. daily 5.  Synthroid 75 mcg p.o. daily 6.  Lopressor 25 mg p.o. twice daily 7.  Protonix 40 mg p.o. twice daily 8.  MiraLAX twice daily hold for loose stools 9.  Seroquel 400 mg p.o. twice daily 10.  Crestor 20 mg p.o. daily 11.  Vitamin D 2000 units p.o.  daily  30-35 minutes were spent completing discharge summary and discharge planning  Discharge Instructions    Ambulatory referral to Physical Medicine Rehab   Complete by: As directed    Moderate complexity follow-up 4 weeks metabolic encephalopathy       Follow-up Information    Raulkar, Clide Deutscher, MD Follow up.   Specialty: Physical Medicine and Rehabilitation Why: office to call for appointment Contact information: [address] [place] [address] [phone]        Lajuana Matte, MD Follow up.   Specialty: Cardiothoracic Surgery Why: call for appointment Contact information: [place] [address] [phone]        Suzette Battiest, MD Follow up.   Specialties: Interventional Radiology, Diagnostic Radiology, Radiology Why: call for appointment Contact information: [place] [address] [phone]        Gatha Mayer, MD Follow up.   Specialty: Gastroenterology Why: call for appointment Contact information: [address] [place] [address] [phone]               Signed: Lavon Paganini [place] 12/07/2020, 5:15 AM

## 2020-12-06 NOTE — Plan of Care (Signed)
  Problem: RH Wheelchair Mobility Goal: LTG Patient will propel w/c in controlled environment (PT) Description: LTG: Patient will propel wheelchair in controlled environment, # of feet with assist (PT) Outcome: Not Applicable Note: D/c goal due to lack of progress secondary to cognitive deficits limiting patient's ability learn a novel task. Goal: LTG Patient will propel w/c in home environment (PT) Description: LTG: Patient will propel wheelchair in home environment, # of feet with assistance (PT). Outcome: Not Applicable Note: D/c goal due to lack of progress secondary to cognitive deficits limiting patient's ability learn a novel task.   Problem: RH Ambulation Goal: LTG Patient will ambulate in controlled environment (PT) Description: LTG: Patient will ambulate in a controlled environment, # of feet with assistance (PT). Flowsheets Taken 12/06/2020 0654 by Serina Cowper L, PT LTG: Pt will ambulate in controlled environ  assist needed:: (downgraded goal due to poor patient safety awareness with mobility.) Contact Guard/Touching assist Taken 11/16/2020 1219 by Alfonso Patten, PT LTG: Ambulation distance in controlled environment: 176ft with LRAD Note: downgraded goal due to poor patient safety awareness with mobility. Goal: LTG Patient will ambulate in home environment (PT) Description: LTG: Patient will ambulate in home environment, # of feet with assistance (PT). Flowsheets Taken 12/06/2020 0654 by Serina Cowper L, PT LTG: Pt will ambulate in home environ  assist needed:: (downgraded goal due to poor patient safety awareness with mobility.) Contact Guard/Touching assist Taken 11/16/2020 1219 by Alfonso Patten, PT LTG: Ambulation distance in home environment: 41ft with LRAD Note: downgraded goal due to poor patient safety awareness with mobility.

## 2020-12-06 NOTE — Patient Care Conference (Signed)
Inpatient RehabilitationTeam Conference and Plan of Care Update Date: 12/06/2020   Time: 10:25 AM    Patient Name: [name]      Medical Record Number: [id number]  Date of Birth: 01-15-1956 Sex: Male         Room/Bed: 4W11C/4W11C-01 Payor Info: Payor: MEDICARE / Plan: MEDICARE PART A AND B / Product Type: *No Product type* /    Admit Date/Time:  11/15/2020  3:05 PM  Primary Diagnosis:  Debility  Hospital Problems: Principal Problem:   Debility Active Problems:   Hyponatremia   Dysphagia   Seizures (HCC)   S/P percutaneous endoscopic gastrostomy (PEG) tube placement [facility])   Dilation of esophagus    Expected Discharge Date: Expected Discharge Date: 12/13/20  Team Members Present: Physician leading conference: Dr. Faith Rogue Care Coodinator Present: Cecile Sheerer, LCSWA;Genifer Lazenby Marlyne Beards, RN, BSN, CRRN Nurse Present: Other (comment) Delene Loll, RN) PT Present: Serina Cowper, PT OT Present: Jake Shark, OT SLP Present: Feliberto Gottron, SLP PPS Coordinator present : Edson Snowball, Park Breed, SLP     Current Status/Progress Goal Weekly Team Focus  Bowel/Bladder   continent of b/b; LBM: 12/13  remain continent of b/b  assist with toileting needs prn   Swallow/Nutrition/ Hydration             ADL's   CGA transfers, ADLs. Impulsive and fast moving. Not a lot of change from last week  Supervision overall  ADL retraining, ADL transfers, BUE coordination, safety, d/c planning   Mobility   Supervision bed mobility and transfers, CGA with intermittent min A with gait due to poor L foot clearance with and without RW with L hand splint. Progress with mobility slowed this week, appears to be aproaching baseline.  Supervision overall, downgraded gait to CGA for safety and d/c w/c goals due to lack of progress.  Gait training, activity tolerance, balance, safety awareness, L UE/LE NMR d/c planning, patient/caregiver education   Communication              Safety/Cognition/ Behavioral Observations            Pain   no c/o pain  remain pain free  assess pain QS and prn   Skin   ecchymosis to bil abdomen from heparin shots; PEG tube site  remain free of new skin breakdown/infection  assess skin QS and prn     Discharge Planning:  Pt currently lives in [facility] group home independent living with 4 other residents and they have 24/7 supervision. Pt sister [name] is his POA. D/C date pending when LOC is authorized for pt to return to group home. Possibly approved between 12/14-12/21. Fam edu completed on 12/10 9am-11am with [facility] staff.   Team Discussion: Peg site red and irritated from being sutured, MD aware and going to check to see if it can be removed before discharge. OT reports patient has not made much progress this week, appears to be at baseline. Goals are supervision. PT reports patient has also not made much progress this week and staff from the group home reported that he is actually walking better that before. SLP reports patient has been discharged from speech and the staff from the group home reports they will just puree his meals anyway. He still continues to display unsafe safety behaviors but this is his reported baseline.  Patient on target to meet rehab goals: yes, he is medically ready for discharge just waiting on clearance from the group home.  *See Care Plan and progress notes  for long and short-term goals.   Revisions to Treatment Plan:  Continue to work on safety behaviors Continue to work on impulsiveness  Teaching Needs: Continue family education  Current Barriers to Discharge: Decreased caregiver support, Home enviroment access/layout, Lack of/limited family support and Behavior  Possible Resolutions to Barriers: Continue with current medications, continue with current safety protocol, provide emotional support to patient and family.     Medical Summary Current Status: no new issues. no seizures since last week. VPA  level high therapeutic. G/J in place  Barriers to Discharge: Medical stability   Possible Resolutions to [employer] Focus: regular lab, data review. may have IR remove G/J tube this before he leaves   Continued Need for Acute Rehabilitation Level of Care: The patient requires daily medical management by a physician with specialized training in physical medicine and rehabilitation for the following reasons: Direction of a multidisciplinary physical rehabilitation program to maximize functional independence : Yes Medical management of patient stability for increased activity during participation in an intensive rehabilitation regime.: Yes Analysis of laboratory values and/or radiology reports with any subsequent need for medication adjustment and/or medical intervention. : Yes   I attest that I was present, lead the team conference, and concur with the assessment and plan of the team.   Tennis Must 12/06/2020, 2:32 PM

## 2020-12-07 ENCOUNTER — Inpatient Hospital Stay (HOSPITAL_COMMUNITY): Payer: Medicare Other

## 2020-12-07 LAB — GLUCOSE, CAPILLARY
Glucose-Capillary: 130 mg/dL — ABNORMAL HIGH (ref 70–99)
Glucose-Capillary: 109 mg/dL — ABNORMAL HIGH (ref 70–99)

## 2020-12-07 MED ORDER — DIVALPROEX SODIUM 500 MG PO DR TAB: 500.0000 mg | DELAYED_RELEASE_TABLET | Freq: Two times a day (BID) | ORAL | 0 refills | Status: DC

## 2020-12-07 MED ORDER — METOPROLOL TARTRATE 25 MG PO TABS: 25.0000 mg | ORAL_TABLET | Freq: Two times a day (BID) | ORAL | 0 refills | Status: DC

## 2020-12-07 NOTE — Progress Notes (Signed)
Inpatient Rehabilitation Care Coordinator  Discharge Note  The overall goal for the admission was met for:   Discharge location: Yes. D/c to [place] with 24/7 care.   Length of Stay: Yes. 22 days.   Discharge activity level: Yes. Supervision to CGA.  Home/community participation: Yes. Limited.   Services provided included: MD, RD, PT, OT, SLP, RN, CM, TR, Pharmacy, Neuropsych and SW  Financial Services: Medicare and Medicaid  Follow-up services arranged: Outpatient: [facility] for outpatient PT/OT and DME: [facility] for RW, and transport chair to be delivered once available  Comments (or additional information): contact pt sister Jackelyn Poling [phone]  Patient/Family verbalized understanding of follow-up arrangements: Yes  Individual responsible for coordination of the follow-up plan: Pt to have assistance with coordinating care needs.   Confirmed correct DME delivered: Rana Snare 12/07/2020    Rana Snare

## 2020-12-07 NOTE — Progress Notes (Signed)
Physical Therapy Discharge Summary  Patient Details  Name: [name] MRN: [id number] Date of Birth: August 06, 1956  Today's Date: 12/07/2020 PT Individual Time: 3086-5784 PT Individual Time Calculation (min): 71 min    Patient has met 8 of 10 long term goals due to improved activity tolerance, improved balance, improved postural control, increased strength, increased range of motion, ability to compensate for deficits and improved coordination.  Patient to discharge at an ambulatory level [place].   Patient's care partner is independent to provide the necessary physical and cognitive assistance at discharge.  Reasons goals not met: Patient unable to make progress with w/c goals during admission. Recommended transport chair for safe community mobility.  Recommendation:  Patient will benefit from ongoing skilled PT services in outpatient setting to continue to advance safe functional mobility, address ongoing impairments in balance, L side strength, functional mobility, gait and stair training, activity tolerance, community integration, patient/caregiver education, and minimize fall risk.  Equipment: RW with L hand splint and transport chair  Reasons for discharge: treatment goals met  Patient/family agrees with progress made and goals achieved: Yes  Skilled Therapeutic Intervention: Patient in bed upon PT arrival. Patient alert and agreeable to PT session. Patient denied pain during session.  Therapeutic Activity: Bed Mobility: Patient performed rolling R/L independently and supine to/from sit with supervision-mod I for increased time in a flat bed without use of bed rails.  Transfers: Patient performed sit to/from stand with and without with RW with L hand splint with supervision-mod I.  Five times Sit to Stand Test (FTSS) Method: Use a straight back chair with a solid seat that is 16-18" high. Ask participant to sit on the chair with arms folded across their chest.    Instructions: "Stand up and sit down as quickly as possible 5 times, keeping your arms folded across your chest."   Measurement: Stop timing when the participant stands the 5th time.  TIME: _13.04_____ (in seconds)  Times > 13.6 seconds is associated with increased disability and morbidity (Guralnik, 2000) Times > 15 seconds is predictive of recurrent falls in healthy individuals aged 63 and older (Buatois, et al., 2008) Normal performance values in community dwelling individuals aged 63 and older (Bohannon, 2006): o 60-69 years: 11.4 seconds o 70-79 years: 12.6 seconds o 80-89 years: 14.8 seconds  MCID: ? 2.3 seconds for Vestibular Disorders (Meretta, 2006)  Gait Training:  Patient ambulated >150 feet x3 using RW with L hand splint with CGA-close supervision. Ambulated as described below. Provided verbal cues for safe use of RW, focusing on L foot clearance with blue band for visual target to step into the RW for reduced toe catching and circumduction into the side of the RW. Did have 4-5 occurences of toe catching, patient able to self-correct without additional assistance. Patient ascended/descended 12-6" steps using R rail with min A-CGA. Performed step-to with intermittent reciprocal gait pattern leading with R with cues. Provided cues for technique and sequencing.  6 Min Walk Test:  Instructed patient to ambulate as quickling and as safely as possible for 6 minutes using LRAD. Patient was allowed to take standing rest breaks without stopping the test, but if he required a sitting rest break the clock would be stopped and the test would be over.  Results: 360 feet ambulating for the full 6 min using a RW with L hand splint. HR 105 at start, 139 at end with mild SOB. HR recovered 110 <2 min with sitting rest break with cues for diaphragmatic breathing. Test  results indicate decreased activity tolerance when compared to age match norms (1867 feet for 13-58 year olds)  Patient in w/c at  end of session with breaks locked, seat belt alarm set, and all needs within reach.    PT Discharge Precautions/Restrictions Precautions Precautions: Fall Restrictions Weight Bearing Restrictions: No Vision/Perception  Perception Perception: Impaired Comments: L inattention at baseline per patient's sister  Cognition Overall Cognitive Status: History of cognitive impairments - at baseline Arousal/Alertness: Awake/alert Orientation Level: Oriented X4 Attention: Selective Selective Attention: Impaired Selective Attention Impairment: Functional complex Memory: Impaired Memory Impairment: Decreased short term memory Decreased Short Term Memory: Functional basic;Verbal basic Awareness: Impaired Awareness Impairment: Emergent impairment Problem Solving: Impaired Problem Solving Impairment: Verbal basic;Functional basic Behaviors: Impulsive Safety/Judgment: Impaired Sensation Sensation Light Touch: Appears Intact Proprioception: Impaired by gross assessment Additional Comments: Decreased L UE/LE coordination with functional mobility Coordination Gross Motor Movements are Fluid and Coordinated: No Fine Motor Movements are Fluid and Coordinated: No Coordination and Movement Description: grossly uncoordinated due L hemiparesis and decreased balance/postural control Heel Shin Test: decreased AROM bilaterally L>R Motor  Motor Motor: Hemiplegia;Abnormal postural alignment and control Motor - Discharge Observations: Improved strength and activity tolerance during admission, baseline hemiparesis from chronic CVA  Mobility Bed Mobility Bed Mobility: Rolling Right;Rolling Left;Sit to Supine;Supine to Sit Rolling Right: Independent Rolling Left: Independent Supine to Sit: Supervision/Verbal cueing Sit to Supine: Supervision/Verbal cueing Transfers Transfers: Sit to Stand;Stand to Sit;Stand Pivot Transfers Sit to Stand: Supervision/Verbal cueing Stand to Sit: Supervision/Verbal  cueing Stand Pivot Transfers: Supervision/Verbal cueing Transfer (Assistive device): Rolling walker (with and without RW) Locomotion  Gait Ambulation: Yes Gait Assistance: Contact Guard/Touching assist Assistive device: Rolling walker (with L hand splint) Gait Assistance Details: Verbal cues for technique;Visual cues for safe use of DME/AE;Verbal cues for safe use of DME/AE Gait Gait: Yes Gait Pattern: Step-through pattern;Decreased stance time - left;Decreased step length - right;Decreased hip/knee flexion - left;Decreased dorsiflexion - left;Decreased weight shift to left;Left circumduction;Left foot flat;Left genu recurvatum;Lateral trunk lean to right;Decreased trunk rotation;Trunk rotated posteriorly on left;Poor foot clearance - left Gait velocity: decreased Wheelchair Mobility Wheelchair Mobility: No (Patient unable to progress with w/c mobility due to L hemiplegia and basline intellectual disabilities)  Trunk/Postural Assessment  Cervical Assessment Cervical Assessment: Exceptions to [facility] (forward head) Thoracic Assessment Thoracic Assessment: Exceptions to [facility] (kyphosis) Lumbar Assessment Lumbar Assessment: Exceptions to [facility] (posterior pelvic tilt) Postural Control Postural Control: Deficits on evaluation  Balance Standardized Balance Assessment Standardized Balance Assessment: Berg Balance Test Berg Balance Test Sit to Stand: Able to stand without using hands and stabilize independently Standing Unsupported: Able to stand safely 2 minutes Sitting with Back Unsupported but Feet Supported on Floor or Stool: Able to sit safely and securely 2 minutes Stand to Sit: Controls descent by using hands Transfers: Able to transfer safely, definite need of hands Standing Unsupported with Eyes Closed: Able to stand 10 seconds safely Standing Ubsupported with Feet Together: Able to place feet together independently but unable to hold for 30 seconds From Standing, Reach Forward with  Outstretched Arm: Can reach forward >12 cm safely (5") From Standing Position, Pick up Object from Floor: Able to pick up shoe, needs supervision From Standing Position, Turn to Look Behind Over each Shoulder: Turn sideways only but maintains balance Turn 360 Degrees: Needs close supervision or verbal cueing Standing Unsupported, Alternately Place Feet on Step/Stool: Able to complete >2 steps/needs minimal assist Standing Unsupported, One Foot in Front: Needs help to step but can hold 15 seconds Standing on  One Leg: Tries to lift leg/unable to hold 3 seconds but remains standing independently Total Score: 36 Static Sitting Balance Static Sitting - Balance Support: No upper extremity supported;Feet supported Static Sitting - Level of Assistance: 7: Independent Dynamic Sitting Balance Dynamic Sitting - Balance Support: During functional activity;No upper extremity supported;Feet supported Dynamic Sitting - Level of Assistance: 7: Independent Static Standing Balance Static Standing - Balance Support: During functional activity;No upper extremity supported Static Standing - Level of Assistance: 7: Independent Dynamic Standing Balance Dynamic Standing - Balance Support: During functional activity;No upper extremity supported Dynamic Standing - Level of Assistance: 5: Stand by assistance Extremity Assessment  RLE Assessment RLE Assessment: Within Functional Limits Active Range of Motion (AROM) Comments: WFL for all functional mobility, slightly limited hip flexion in sitting General Strength Comments: grossly 5/5 in sitting LLE Assessment LLE Assessment: Exceptions to [facility] General Strength Comments: grossly in sitting: hip flexion 4/5 within available range, knee extension 4+/5, knee flexion 3/5, DF 1-2/5, PF 4/5    Geena Weinhold L Lillie Portner PT, DPT  12/07/2020, 12:46 PM

## 2020-12-07 NOTE — Progress Notes (Signed)
Occupational Therapy Discharge Summary  Patient Details  Name: [name] MRN: [id number] Date of Birth: 17-Jul-1956  Today's Date: 12/07/2020 OT Individual Time: 1937-9024 OT Individual Time Calculation (min): 51 min    Patient has met 10 of 13 long term goals due to improved activity tolerance, improved balance, postural control, ability to compensate for deficits, functional use of  RIGHT upper extremity, improved attention, improved awareness and improved coordination.  Patient to discharge at overall Supervision level.  Patient's care partner is independent to provide the necessary physical and cognitive assistance at discharge.    Reasons goals not met: Pt still requires CGA for ADL transfers for safety. Despite his caregivers stating he is doing "even better" than before, OT does not feel safe taking hands off pt during these dynamic transfers.   Recommendation:  Patient will benefit from ongoing skilled OT services in outpatient setting to continue to advance functional skills in the area of BADL and Reduce care partner burden.  Equipment: No equipment provided  Reasons for discharge: treatment goals met and discharge from hospital  Patient/family agrees with progress made and goals achieved: Yes   Skilled OT intervention: Pt received supine with no c/o pain, agreeable to take shower. His peg was slightly occluded with tegaderm patch and pt doffed all clothing with close supervision. Pt completed ambulatory transfer into the bathroom with CGA using the RW. Pt completed shower at supervision level overall, good use of grab bar. Pt returned to EOB and donned shirt and pants with supervision. He was also able to use adapted technique to don socks and shoes. He stood with the RW, requiring cueing to fasten orthotic and, then CGA for 125 ft ambulatory transfer to therapy gym. He sat EOM and completed BUE strengthening circuit with a 2 lb dowel, requiring mod facilitation for  proper motor pattern of the LUE. Pt returned to his room and was left supine with all needs met, bed alarm set.   OT Discharge Precautions/Restrictions  Precautions Precautions: Fall Precaution Comments: L hemiparesis, PEG Restrictions Weight Bearing Restrictions: No   Pain Pain Assessment Pain Scale: 0-10 Pain Score: 0-No pain ADL ADL Eating: Set up Where Assessed-Eating: Bed level Grooming: Supervision/safety Where Assessed-Grooming: Sitting at sink Upper Body Bathing: Supervision/safety Where Assessed-Upper Body Bathing: Shower Lower Body Bathing: Supervision/safety Where Assessed-Lower Body Bathing: Shower Upper Body Dressing: Supervision/safety Where Assessed-Upper Body Dressing: Edge of bed Lower Body Dressing: Supervision/safety Where Assessed-Lower Body Dressing: Standing at sink,Sitting at sink Toileting: Contact guard Where Assessed-Toileting: Glass blower/designer: Therapist, music Method: Counselling psychologist: Energy manager: Curator Method: Radiographer, therapeutic: Gaffer Baseline Vision/History: Wears glasses Wears Glasses: Reading only Patient Visual Report: No change from baseline Vision Assessment?: No apparent visual deficits Perception  Perception: Impaired Comments: L inattention at baseline Praxis Praxis: Intact Cognition Overall Cognitive Status: History of cognitive impairments - at baseline Arousal/Alertness: Awake/alert Orientation Level: Oriented X4 Attention: Selective Selective Attention: Impaired Selective Attention Impairment: Functional complex Memory: Impaired Memory Impairment: Decreased short term memory Decreased Short Term Memory: Functional basic;Verbal basic Awareness: Impaired Awareness Impairment: Emergent impairment Problem Solving: Impaired Problem Solving Impairment: Verbal basic;Functional  basic Behaviors: Impulsive Safety/Judgment: Impaired Sensation Sensation Light Touch: Appears Intact Proprioception: Impaired by gross assessment Additional Comments: Decreased L UE/LE coordination with functional mobility Coordination Gross Motor Movements are Fluid and Coordinated: No Fine Motor Movements are Fluid and Coordinated: No Coordination and Movement Description: grossly uncoordinated due  L hemiparesis and decreased balance/postural control Heel Shin Test: decreased AROM bilaterally L>R Motor  Motor Motor: Hemiplegia;Abnormal postural alignment and control Motor - Discharge Observations: Improved strength and activity tolerance during admission, baseline hemiparesis from chronic CVA Mobility  Bed Mobility Bed Mobility: Rolling Right;Rolling Left;Sit to Supine;Supine to Sit Rolling Right: Independent Rolling Left: Independent Supine to Sit: Supervision/Verbal cueing Sit to Supine: Supervision/Verbal cueing Transfers Sit to Stand: Supervision/Verbal cueing Stand to Sit: Supervision/Verbal cueing  Trunk/Postural Assessment  Cervical Assessment Cervical Assessment: Exceptions to [facility] (forward head) Thoracic Assessment Thoracic Assessment: Exceptions to [facility] (rounded shoulders) Lumbar Assessment Lumbar Assessment: Exceptions to [facility] (posterior pelvic tilt) Postural Control Postural Control: Deficits on evaluation (delayed)  Balance Balance Balance Assessed: Yes Static Sitting Balance Static Sitting - Balance Support: No upper extremity supported;Feet supported Static Sitting - Level of Assistance: 7: Independent Dynamic Sitting Balance Dynamic Sitting - Balance Support: During functional activity;No upper extremity supported;Feet supported Dynamic Sitting - Level of Assistance: 7: Independent Static Standing Balance Static Standing - Balance Support: During functional activity;No upper extremity supported Static Standing - Level of Assistance: 7:  Independent Dynamic Standing Balance Dynamic Standing - Balance Support: During functional activity;No upper extremity supported Dynamic Standing - Level of Assistance: 5: Stand by assistance Extremity/Trunk Assessment RUE Assessment RUE Assessment: Exceptions to [facility] General Strength Comments: generalized weakness LUE Assessment LUE Assessment: Exceptions to [facility] LUE Body System: Neuro (baseline) Brunstrum levels for arm and hand: Arm;Hand Brunstrum level for arm: Stage IV Movement is deviating from synergy Brunstrum level for hand: Stage IV Movements deviating from synergies   Curtis Sites 12/07/2020, 12:35 PM

## 2020-12-07 NOTE — Progress Notes (Signed)
Occupational Therapy Session Note  Patient Details  Name: [name] MRN: [id number] Date of Birth: 31-Dec-1955  Today's Date: 12/07/2020 OT Individual Time: 1345-1440 OT Individual Time Calculation (min): 55 min    Short Term Goals: Week 3:  OT Short Term Goal 1 (Week 3): STG=LTG d/t ELOS  Skilled Therapeutic Interventions/Progress Updates:    1:1. Pt received in w/c agreeable to OT. Pt family declined transport chair. OT trials simulated community mobility with Pt using RW up/down a curb, across mulch for uneven terrain, and up/down ramp. For functional mobility with RW pt requires MAX VC For sequencing safe RW use as well as up to MOD A for curb negotiation with A to manage RW. Would not recommend this out in community d/t pt poor safety awareness, sequencing and impulsivity impacting community level mobility. Pt gathers bean bags in dayroom at ambulatory level with significant increase in difficulty with manage LLE and RW in distracting environment/while visual scanning requiring CGA overall d/t L foot drag. Pt encouraged to stand statically and visually scan for bean bag prior to mobilizing to retrieve as compensatory strategy for poor selective attention. Attempted to toilet 2x with direct VC for managing RW over toilet for standing voiding. Unsuccessful both times. Exited session with pt seated in bed, exit alarm on and call light in reach   Therapy Documentation Precautions:  Precautions Precautions: Fall Precaution Comments: L hemiparesis, PEG Restrictions Weight Bearing Restrictions: No General:   Vital Signs:   Pain: Pain Assessment Pain Scale: 0-10 Pain Score: 0-No pain ADL: ADL Eating: Set up Where Assessed-Eating: Bed level Grooming: Supervision/safety Where Assessed-Grooming: Sitting at sink Upper Body Bathing: Supervision/safety Where Assessed-Upper Body Bathing: Shower Lower Body Bathing: Supervision/safety Where Assessed-Lower Body Bathing:  Shower Upper Body Dressing: Supervision/safety Where Assessed-Upper Body Dressing: Edge of bed Lower Body Dressing: Supervision/safety Where Assessed-Lower Body Dressing: Standing at sink,Sitting at sink Toileting: Contact guard Where Assessed-Toileting: Teacher, adult education: Furniture conservator/restorer Method: Proofreader: Acupuncturist: Administrator, arts Method: Warden/ranger: Sales promotion account executive Baseline Vision/History: Wears glasses Wears Glasses: Reading only Patient Visual Report: No change from baseline Vision Assessment?: No apparent visual deficits Perception  Perception: Impaired Comments: L inattention at baseline Praxis Praxis: Intact Exercises:   Other Treatments:     Therapy/Group: Individual Therapy  [name] 12/07/2020, 2:41 PM

## 2020-12-07 NOTE — Progress Notes (Signed)
Patient ID: [name], male   DOB: March 31, 1956, 64 y.o.   MRN: [id number]  SW spoke with pt sister [name] [phone]) to review messages left yesterday. She confirms she received messages. She did express concerns related to pt not having outpatient speech therapy since pt is expected to get dentures his diet would be upgraded. SW explained at this time he was discharged from speech with D2 thin with meals cut finely. Per ST, group home has stated they will puree his meals to ensure safety. SW informed if she continues to express concerns, and she can contact [facility]hab [facility] and/or discuss with pt outpatient therapists going forward. SW informed that the concerns related the elevated mattress were not supported after attending review of records. Sister reports it was recommended by pt long time GI doctor. SW encouraged her to follow-up with this physician for prescription. SW informed will verify with [facility] staff all will move as planned with pt pick up for 4pm. She reports that [facility] staff will pick him up today. She also reports she will confirm if they will take pt to get his dentures today. SW updated pt assigned RN on pt d/c time and left d/c packet at front desk.   SW spoke with Tabitha/RN with [facility] [phone]) to confirm pt d/c today. SW informed on above about [facility] to pick up pt at 4pm today. States she will call to remind staff. SW requested that they call nursing station 10 minutes before arrival, so they can have patient ready and downstairs; contact information provided. Tabitha asked id d/c summary and AVS were faxed. SW informed these items will be provided and faxed today upon pt discharge. When discussing prescriptions and picking up at [facility] on [address], states this is a back up pharmacy and they use [facility]- a pharmacy based in [place] in which it allows them to get Rx covered at 100%, using [facility] pharmacy they will be required to pay out of pocket. States will need original  scripts so they can be sent to the pharmacy. SW discussed with PA. SW and PA spoke with [place].RN with [facility] to discuss unable to print scripts. Wyatt Mage reports they can use d/c summary and their physician can write new orders for meds. SW faxed d/c summary, AVS, negative COVID, and clinicals to [phone]. *SW confirmed with [place] faxed received.   SW later returned phone call and left message for Wyatt Mage informing that the referral for outpatient therapies is at [facility] and they will call to schedule. SW also informed all information can be found in AVS.   Cecile Sheerer, MSW, LCSWA Office: [phone] Cell: [phone] Fax: [phone]

## 2020-12-07 NOTE — Discharge Instructions (Signed)
Inpatient Rehab Discharge Instructions  [name] Discharge date and time: No discharge date for patient encounter.   Activities/Precautions/ Functional Status: Activity: activity as tolerated Diet: Dysphagia #2 thin liquids Wound Care: routine skin checks Functional status:  ___ No restrictions     ___ Walk up steps independently ___ 24/7 supervision/assistance   ___ Walk up steps with assistance ___ Intermittent supervision/assistance  ___ Bathe/dress independently ___ Walk with walker     _x__ Bathe/dress with assistance ___ Walk Independently    ___ Shower independently ___ Walk with assistance    ___ Shower with assistance ___ No alcohol     ___ Return to work/school ________   COMMUNITY REFERRALS UPON DISCHARGE:    Outpatient: PT   OT             Agency: [facility] Phone: [phone]              Appointment Date/Time:*Please expect follow-up within 7-10 business days to schedule appointment. If you have not received follow-up, be sure to contact the site directly.*  Medical Equipment/Items Ordered: rolling walker and transport chair                                                 Agency/Supplier: [employer] [id number]   Special Instructions: No smoking driving or alcohol  Follow-up interventional radiology in approximately 2 to 3 weeks for removal of gastrostomy tube [phone]   My questions have been answered and I understand these instructions. I will adhere to these goals and the provided educational materials after my discharge from the hospital.  Patient/Caregiver Signature _______________________________ Date __________  Clinician Signature _______________________________________ Date __________  Please bring this form and your medication list with you to all your follow-up doctor's appointments.

## 2020-12-13 ENCOUNTER — Emergency Department (HOSPITAL_COMMUNITY)
Admission: EM | Admit: 2020-12-13 | Discharge: 2020-12-13 | Disposition: A | Discharge status: Home / Self Care | Payer: Medicare Other | Attending: Emergency Medicine | Admitting: Emergency Medicine

## 2020-12-13 ENCOUNTER — Encounter (HOSPITAL_COMMUNITY): Payer: Self-pay | Admitting: Emergency Medicine

## 2020-12-13 ENCOUNTER — Other Ambulatory Visit: Payer: Self-pay

## 2020-12-13 ENCOUNTER — Emergency Department (HOSPITAL_COMMUNITY): Payer: Medicare Other

## 2020-12-13 DIAGNOSIS — Z79899 Other long term (current) drug therapy: Secondary | ICD-10-CM | POA: Diagnosis not present

## 2020-12-13 DIAGNOSIS — R55 Syncope and collapse: Secondary | ICD-10-CM | POA: Insufficient documentation

## 2020-12-13 DIAGNOSIS — E039 Hypothyroidism, unspecified: Secondary | ICD-10-CM | POA: Diagnosis not present

## 2020-12-13 DIAGNOSIS — E119 Type 2 diabetes mellitus without complications: Secondary | ICD-10-CM | POA: Insufficient documentation

## 2020-12-13 DIAGNOSIS — I10 Essential (primary) hypertension: Secondary | ICD-10-CM | POA: Insufficient documentation

## 2020-12-13 DIAGNOSIS — R42 Dizziness and giddiness: Secondary | ICD-10-CM | POA: Diagnosis not present

## 2020-12-13 HISTORY — DX: Cerebral infarction, unspecified: I63.9

## 2020-12-13 LAB — CBC WITH DIFFERENTIAL/PLATELET
Abs Immature Granulocytes: 0.02 10*3/uL (ref 0.00–0.07)
Basophils Absolute: 0 10*3/uL (ref 0.0–0.1)
Basophils Relative: 1 %
Eosinophils Absolute: 0.1 10*3/uL (ref 0.0–0.5)
Eosinophils Relative: 1 %
HCT: 34 % — ABNORMAL LOW (ref 39.0–52.0)
Hemoglobin: 10.3 g/dL — ABNORMAL LOW (ref 13.0–17.0)
Immature Granulocytes: 0 %
Lymphocytes Relative: 23 %
Lymphs Abs: 1.4 10*3/uL (ref 0.7–4.0)
MCH: 25.4 pg — ABNORMAL LOW (ref 26.0–34.0)
MCHC: 30.3 g/dL (ref 30.0–36.0)
MCV: 84 fL (ref 80.0–100.0)
Monocytes Absolute: 0.8 10*3/uL (ref 0.1–1.0)
Monocytes Relative: 12 %
Neutro Abs: 3.9 10*3/uL (ref 1.7–7.7)
Neutrophils Relative %: 63 %
Platelets: 255 10*3/uL (ref 150–400)
RBC: 4.05 MIL/uL — ABNORMAL LOW (ref 4.22–5.81)
RDW: 17.3 % — ABNORMAL HIGH (ref 11.5–15.5)
WBC: 6.2 10*3/uL (ref 4.0–10.5)
nRBC: 0 % (ref 0.0–0.2)

## 2020-12-13 LAB — BASIC METABOLIC PANEL
Anion gap: 10 (ref 5–15)
BUN: 5 mg/dL — ABNORMAL LOW (ref 8–23)
CO2: 25 mmol/L (ref 22–32)
Calcium: 8.6 mg/dL — ABNORMAL LOW (ref 8.9–10.3)
Chloride: 102 mmol/L (ref 98–111)
Creatinine, Ser: 0.89 mg/dL (ref 0.61–1.24)
GFR, Estimated: >60 mL/min (ref >60)
Glucose, Bld: 99 mg/dL (ref 70–99)
Potassium: 3.5 mmol/L (ref 3.5–5.1)
Sodium: 137 mmol/L (ref 135–145)

## 2020-12-13 LAB — TROPONIN I (HIGH SENSITIVITY): Troponin I (High Sensitivity): 5 ng/L (ref <18)

## 2020-12-13 MED ORDER — SODIUM CHLORIDE 0.9 % IV BOLUS
500.0000 mL | Freq: Once | INTRAVENOUS | Status: AC
  Administered 2020-12-13: 500 mL via INTRAVENOUS

## 2020-12-13 NOTE — ED Triage Notes (Signed)
pt6 is from a facility and had b een in [place] to see the dentist and had a near syncopal epusode, got dizzy  When he stood with increased heart rate   pt is status post hernia surgery a few weeks ago and was admitted for 14 days and had a peg tube  placed... due to come out soon per ems

## 2020-12-13 NOTE — ED Notes (Addendum)
Report given to [place], staff at group home; Tabitha to set up transport back to facility

## 2020-12-13 NOTE — ED Notes (Signed)
Pt ambulatory with walker with steady gait

## 2020-12-13 NOTE — ED Provider Notes (Signed)
[facility] EMERGENCY DEPARTMENT Provider Note   CSN: [id number] Arrival date & time:        History Chief Complaint  Patient presents with   Near Syncope    [name] is a 64 y.o. male.  Patient with history of CVA, presents to ER with chief complaint of lightheadedness and dizziness and near syncope.  He was in his [place] Niece from his group home headed toward the dentist office.  When he was getting out of car he felt lightheaded and dizzy and felt like he was about to pass out.  Denies loss of consciousness or fall.  Sent to the ER for further evaluation.  This time he has no complaints of headache or chest pain abdominal pain.  No reports of fever cough vomiting or diarrhea.        Past Medical History:  Diagnosis Date   Barrett esophagus    DM II (diabetes mellitus, type II), controlled (HCC) 11/13/2020   Dysphagia    Gastroparesis    GERD (gastroesophageal reflux disease)    Hypercholesteremia    Hypertension    Iron deficiency anemia    Mental retardation    Schizophrenia (HCC)    Seizure (HCC)    Stroke [facility])     Patient Active Problem List   Diagnosis Date Noted   Dilation of esophagus    Hyponatremia    Dysphagia    Seizures (HCC)    S/P percutaneous endoscopic gastrostomy (PEG) tube placement (HCC)    Debility 11/15/2020   DM II (diabetes mellitus, type II), controlled (HCC) 11/13/2020   Bowel obstruction (HCC)    S/P repair of paraesophageal hernia 10/26/2020   Syncope 10/21/2020   Hiatal hernia 10/21/2020   Acute hypoxemic respiratory failure (HCC) 10/21/2020   Sinus tachycardia 10/21/2020   Gastric polyps    Altered mental status 09/05/2020   Emesis 07/24/2015   Coffee ground emesis    Gastrointestinal hemorrhage associated with other gastritis    Acute esophagitis 01/09/2015   Upper gastrointestinal bleed    Diarrhea    Nausea with vomiting    GI bleed 01/08/2015   GIB  (gastrointestinal bleeding) 01/08/2015   Nausea & vomiting 01/08/2015   Reflux esophagitis 07/04/2009   Loss of weight 07/04/2009   BLOOD IN STOOL, OCCULT 07/04/2009   ANEMIA, IRON DEFICIENCY 05/04/2008   Hypothyroidism 04/05/2008   Schizophrenia, unspecified type (HCC) 04/05/2008   Depression 04/05/2008   HEMIPARESIS, LEFT 04/05/2008   Essential hypertension 04/05/2008   GERD 04/05/2008   Gastroparesis 04/05/2008   Seizure (HCC) 04/05/2008   History of diverticulosis 08/21/2004   Barrett's esophagus 04/01/2002   HIATAL HERNIA 04/01/2002    Past Surgical History:  Procedure Laterality Date   BIOPSY  10/21/2020   Procedure: BIOPSY;  Surgeon: Iva Boop, MD;  Location: [facility] ENDOSCOPY;  Service: Endoscopy;;   CRANIOTOMY FOR CYST FENESTRATION     ESOPHAGOGASTRODUODENOSCOPY N/A 01/09/2015   Procedure: ESOPHAGOGASTRODUODENOSCOPY (EGD);  Surgeon: Louis Meckel, MD;  Location: [facility] ENDOSCOPY;  Service: Endoscopy;  Laterality: N/A;   ESOPHAGOGASTRODUODENOSCOPY N/A 10/26/2020   Procedure: ESOPHAGOGASTRODUODENOSCOPY (EGD);  Surgeon: Corliss Skains, MD;  Location: [facility] OR;  Service: Thoracic;  Laterality: N/A;   ESOPHAGOGASTRODUODENOSCOPY (EGD) WITH PROPOFOL N/A 10/21/2020   Procedure: ESOPHAGOGASTRODUODENOSCOPY (EGD) WITH PROPOFOL;  Surgeon: Iva Boop, MD;  Location: [facility] ENDOSCOPY;  Service: Endoscopy;  Laterality: N/A;   IR GASTR TUBE CONVERT GASTR-JEJ PER W/FL MOD SED  11/07/2020   PEG PLACEMENT N/A 10/26/2020  Procedure: PERCUTANEOUS ENDOSCOPIC GASTROSTOMY (PEG) PLACEMENT;  Surgeon: Corliss Skains, MD;  Location: [facility] OR;  Service: Thoracic;  Laterality: N/A;   XI ROBOTIC ASSISTED HIATAL HERNIA REPAIR N/A 10/26/2020   Procedure: XI ROBOTIC ASSISTED HIATAL HERNIA REPAIR;  Surgeon: Corliss Skains, MD;  Location: [facility] OR;  Service: Thoracic;  Laterality: N/A;       No family history on file.  Social History   Tobacco Use   Smoking status:  Never Smoker   Smokeless tobacco: Never Used  Vaping Use   Vaping Use: Never used  Substance Use Topics   Alcohol use: No   Drug use: No    Home Medications Prior to Admission medications   Medication Sig Start Date End Date Taking? Authorizing Provider  acetaminophen (TYLENOL) 325 MG tablet Take 2 tablets (650 mg total) by mouth every 6 (six) hours as needed for mild pain or fever. 12/06/20   Angiulli, Mcarthur Rossetti, PA-C  Cholecalciferol (VITAMIN D) 50 MCG (2000 UT) tablet Take 1 tablet (2,000 Units total) by mouth daily. 12/06/20   Angiulli, Mcarthur Rossetti, PA-C  divalproex (DEPAKOTE) 500 MG DR tablet Take 1 tablet (500 mg total) by mouth every 12 (twelve) hours. 12/07/20   Angiulli, Mcarthur Rossetti, PA-C  docusate sodium (COLACE) 100 MG capsule Take 1 capsule (100 mg total) by mouth 2 (two) times daily. 12/06/20   Angiulli, Mcarthur Rossetti, PA-C  lamoTRIgine (LAMICTAL) 100 MG tablet Take 1 tablet (100 mg total) by mouth daily. 12/07/20   Angiulli, Mcarthur Rossetti, PA-C  levothyroxine (SYNTHROID) 75 MCG tablet Take 1 tablet (75 mcg total) by mouth daily at 6 (six) AM. 12/07/20   Angiulli, Mcarthur Rossetti, PA-C  metoprolol tartrate (LOPRESSOR) 25 MG tablet Take 1 tablet (25 mg total) by mouth 2 (two) times daily. 12/07/20   Angiulli, Mcarthur Rossetti, PA-C  pantoprazole (PROTONIX) 40 MG tablet Take 1 tablet (40 mg total) by mouth 2 (two) times daily before a meal. 12/06/20   Angiulli, Mcarthur Rossetti, PA-C  polyethylene glycol (MIRALAX / GLYCOLAX) 17 g packet Take 17 g by mouth 2 (two) times daily. 12/06/20   Angiulli, Mcarthur Rossetti, PA-C  QUEtiapine (SEROQUEL) 400 MG tablet Take 1 tablet (400 mg total) by mouth 2 (two) times daily. 12/06/20   Angiulli, Mcarthur Rossetti, PA-C  rosuvastatin (CRESTOR) 20 MG tablet Take 1 tablet (20 mg total) by mouth daily. 12/07/20   Angiulli, Mcarthur Rossetti, PA-C    Allergies    Vancomycin  Review of Systems   Review of Systems  Constitutional: Negative for fever.  HENT: Negative for ear pain and sore throat.   Eyes:  Negative for pain.  Respiratory: Negative for cough.   Cardiovascular: Negative for chest pain.  Gastrointestinal: Negative for abdominal pain.  Genitourinary: Negative for flank pain.  Musculoskeletal: Negative for back pain.  Skin: Negative for color change and rash.  Neurological: Negative for syncope.  All other systems reviewed and are negative.   Physical Exam Updated Vital Signs BP 112/73    Pulse 82    Temp 98.3 F (36.8 C) (Oral)    Resp 17    SpO2 99%   Physical Exam Constitutional:      General: He is not in acute distress.    Appearance: He is well-developed.  HENT:     Head: Normocephalic.     Mouth/Throat:     Mouth: Mucous membranes are moist.  Cardiovascular:     Rate and Rhythm: Normal rate.  Pulmonary:     Effort:  Pulmonary effort is normal.  Abdominal:     Palpations: Abdomen is soft.  Musculoskeletal:     Right lower leg: No edema.     Left lower leg: No edema.  Skin:    General: Skin is warm.     Capillary Refill: Capillary refill takes less than 2 seconds.  Neurological:     Mental Status: He is alert.     Comments: Left upper extremity weakness 4/5 present.  Consistent with history of stroke in the past.     ED Results / Procedures / Treatments   Labs (all labs ordered are listed, but only abnormal results are displayed) Labs Reviewed  BASIC METABOLIC PANEL - Abnormal; Notable for the following components:      Result Value   BUN 5 (*)    Calcium 8.6 (*)    All other components within normal limits  CBC WITH DIFFERENTIAL/PLATELET - Abnormal; Notable for the following components:   RBC 4.05 (*)    Hemoglobin 10.3 (*)    HCT 34.0 (*)    MCH 25.4 (*)    RDW 17.3 (*)    All other components within normal limits  TROPONIN I (HIGH SENSITIVITY)    EKG None  Radiology DG Chest 2 View  Result Date: 12/13/2020 CLINICAL DATA:  Weakness. EXAM: CHEST - 2 VIEW COMPARISON:  CT angiogram chest 11/13/2020. Prior chest radiographs 11/12/2020  and earlier. FINDINGS: Heart size within normal limits. No appreciable airspace consolidation. No evidence of pleural effusion or pneumothorax. No acute bony abnormality identified. IMPRESSION: No evidence of active cardiopulmonary disease. Electronically Signed   By: Jackey Loge DO   On: 12/13/2020 14:47    Procedures Procedures (including critical care time)  Medications Ordered in ED Medications  sodium chloride 0.9 % bolus 500 mL (500 mLs Intravenous New Bag/Given 12/13/20 1350)    ED Course  I have reviewed the triage vital signs and the nursing notes.  Pertinent labs & imaging results that were available during my care of the patient were reviewed by me and considered in my medical decision making (see chart for details).    MDM Rules/Calculators/A&P                          Labs are unremarkable white count 6 hemoglobin 13 chemistry normal troponin negative.  Patient is here for several hours with no additional adverse events.  Vital signs within normal limits.  Ambulation trial was normal patient able to ambulate with his walker without issue.  Case discussed with his sister who is his power of attorney and findings discussed all questions answered.  Patient discharged home in stable condition advised follow-up with his doctor within the week.  Advised to return for fevers vomiting pain or any additional concerns. Final Clinical Impression(s) / ED Diagnoses Final diagnoses:  Near syncope    Rx / DC Orders ED Discharge Orders    None       Cheryll Cockayne, MD 12/13/20 1544

## 2020-12-13 NOTE — Discharge Instructions (Addendum)
Call your primary care doctor or specialist as discussed in the next 2-3 days.   Return immediately back to the ER if:  Your symptoms worsen within the next 12-24 hours. You develop new symptoms such as new fevers, persistent vomiting, new pain, shortness of breath, or new weakness or numbness, or if you have any other concerns.  

## 2020-12-20 ENCOUNTER — Ambulatory Visit (INDEPENDENT_AMBULATORY_CARE_PROVIDER_SITE_OTHER): Payer: Self-pay | Admitting: Thoracic Surgery (Cardiothoracic Vascular Surgery)

## 2020-12-20 ENCOUNTER — Encounter: Payer: Self-pay | Admitting: Thoracic Surgery (Cardiothoracic Vascular Surgery)

## 2020-12-20 ENCOUNTER — Other Ambulatory Visit: Payer: Self-pay

## 2020-12-20 ENCOUNTER — Telehealth (HOSPITAL_COMMUNITY): Payer: Self-pay

## 2020-12-20 VITALS — BP 138/90 | HR 124 | Resp 20 | Ht 64.0 in | Wt 160.0 lb

## 2020-12-20 DIAGNOSIS — Z9889 Other specified postprocedural states: Secondary | ICD-10-CM

## 2020-12-20 NOTE — Progress Notes (Signed)
      [address]       [place] [address]             [phone]        [name] [facility] Medical Record #[id number] Date of Birth: September 25, 1956  Referring: Karren Burly, MD Primary Care: [facility] Primary Cardiologist:No primary care provider on file.  Reason for visit:   follow-up  History of Present Illness:     [name] comes in for his first follow-up appointment after undergoing a robotic assisted paraesophageal hernia repair with PEG tube placement.  He is back at his assisted living facility, and has been tolerating a regular diet.  He has not required any J-tube feeds or any gastric tube venting.  Physical Exam: BP 138/90 (BP Location: Left Arm, Patient Position: Sitting)   Pulse (!) 124   Resp 20   Ht 5\' 4"  (1.626 m)   Wt 160 lb (72.6 kg)   SpO2 93% Comment: RA with mask on  BMI 27.46 kg/m   Alert NAD Incision clean.   Abdomen soft, ND No peripheral edema     Assessment / Plan:   64 year old male status post paraesophageal hernia repair with PEG tube placement.  His PEG tube was converted to PEG-J by interventional radiology.  I remove the PEG J-tube today in clinic.  Instructions provided for wound care. He is to follow-up with his gastroenterologist and is clear for any endoscopies.  He can follow-up with me as needed.   65 12/20/2020 3:56 PM

## 2020-12-20 NOTE — Telephone Encounter (Signed)
Returned [employer] phone call to schedule peg removal, no answer, left vm. AW

## 2020-12-29 ENCOUNTER — Telehealth: Payer: Self-pay

## 2020-12-29 ENCOUNTER — Ambulatory Visit: Payer: Medicare Other | Attending: Physician Assistant

## 2020-12-29 ENCOUNTER — Ambulatory Visit: Payer: Medicare Other | Admitting: Occupational Therapy

## 2020-12-29 DIAGNOSIS — R414 Neurologic neglect syndrome: Secondary | ICD-10-CM | POA: Insufficient documentation

## 2020-12-29 DIAGNOSIS — R41840 Attention and concentration deficit: Secondary | ICD-10-CM | POA: Insufficient documentation

## 2020-12-29 DIAGNOSIS — I69354 Hemiplegia and hemiparesis following cerebral infarction affecting left non-dominant side: Secondary | ICD-10-CM | POA: Insufficient documentation

## 2020-12-29 DIAGNOSIS — R2689 Other abnormalities of gait and mobility: Secondary | ICD-10-CM | POA: Insufficient documentation

## 2020-12-29 DIAGNOSIS — M6281 Muscle weakness (generalized): Secondary | ICD-10-CM | POA: Insufficient documentation

## 2020-12-29 DIAGNOSIS — R262 Difficulty in walking, not elsewhere classified: Secondary | ICD-10-CM | POA: Insufficient documentation

## 2020-12-29 DIAGNOSIS — R293 Abnormal posture: Secondary | ICD-10-CM | POA: Insufficient documentation

## 2020-12-29 DIAGNOSIS — R5381 Other malaise: Secondary | ICD-10-CM | POA: Insufficient documentation

## 2020-12-29 DIAGNOSIS — R2681 Unsteadiness on feet: Secondary | ICD-10-CM | POA: Insufficient documentation

## 2020-12-29 NOTE — Telephone Encounter (Signed)
Patient's sister and POA contacted the office with questions about her brothers care.  She stated that she was unsure whom to call about her brothers progress after surgery and discharge.  Advised that if he had problems with surgical incision and/or pain she should contact our office. He was seen by Dr. Cliffton Asters 12/20/20 where Dr. Cliffton Asters removed his PEG tube as he was tolerating pureed food and he was cleared from the office.  Advised that it was important for patient to contact and get an appointment as soon as possible to patient's GI physician for long term management and follow up after discharge form the hospital. She acknowledged receipt.  Also advised that patient should not lift a significant amount of weight until his incisions were completely healed as this could cause hernia to happen again.  She acknowledged receipt and thanked me for the return call.

## 2021-01-03 ENCOUNTER — Ambulatory Visit (HOSPITAL_COMMUNITY): Payer: Medicare Other

## 2021-01-04 ENCOUNTER — Other Ambulatory Visit: Payer: Self-pay

## 2021-01-04 ENCOUNTER — Ambulatory Visit: Payer: Medicare Other | Admitting: Occupational Therapy

## 2021-01-04 ENCOUNTER — Ambulatory Visit: Payer: Medicare Other

## 2021-01-04 VITALS — BP 157/90 | HR 120

## 2021-01-04 DIAGNOSIS — R2689 Other abnormalities of gait and mobility: Secondary | ICD-10-CM

## 2021-01-04 DIAGNOSIS — R5381 Other malaise: Secondary | ICD-10-CM

## 2021-01-04 DIAGNOSIS — R414 Neurologic neglect syndrome: Secondary | ICD-10-CM

## 2021-01-04 DIAGNOSIS — I69354 Hemiplegia and hemiparesis following cerebral infarction affecting left non-dominant side: Secondary | ICD-10-CM | POA: Diagnosis present

## 2021-01-04 DIAGNOSIS — R293 Abnormal posture: Secondary | ICD-10-CM

## 2021-01-04 DIAGNOSIS — R262 Difficulty in walking, not elsewhere classified: Secondary | ICD-10-CM

## 2021-01-04 DIAGNOSIS — R41840 Attention and concentration deficit: Secondary | ICD-10-CM | POA: Diagnosis present

## 2021-01-04 DIAGNOSIS — R2681 Unsteadiness on feet: Secondary | ICD-10-CM | POA: Diagnosis present

## 2021-01-04 DIAGNOSIS — M6281 Muscle weakness (generalized): Secondary | ICD-10-CM | POA: Diagnosis present

## 2021-01-04 NOTE — Therapy (Signed)
[facility] [address] [place], [place], [address] Phone: [phone]   Fax:  [phone]  Physical Therapy Evaluation  Patient Details  Name: [name] MRN: [id number] Date of Birth: [birth year] Referring Provider (PT): Referred by: Mariam Dollaraniel Angiulli PA-C. Followed by  Sula SodaKrutika Raulkar, MD   Encounter Date: 01/04/2021   PT End of Session - 01/04/21 1226    Visit Number 1    Number of Visits 9    Date for PT Re-Evaluation 04/04/21   POC for 8 weeks, Cert for 90 days   Authorization Type Medicare/Medicaid Secondary (follow medicare guidelines)    Progress Note Due on Visit 10    PT Start Time 1015    PT Stop Time 1058    PT Time Calculation (min) 43 min    Equipment Utilized During Treatment Gait belt    Activity Tolerance Patient tolerated treatment well    Behavior During Therapy Impulsive           Past Medical History:  Diagnosis Date  . Barrett esophagus   . DM II (diabetes mellitus, type II), controlled (HCC) 11/13/2020  . Dysphagia   . Gastroparesis   . GERD (gastroesophageal reflux disease)   . Hypercholesteremia   . Hypertension   . Iron deficiency anemia   . Mental retardation   . Schizophrenia (HCC)   . Seizure (HCC)   . Stroke [facility](HCC)     Past Surgical History:  Procedure Laterality Date  . BIOPSY  10/21/2020   Procedure: BIOPSY;  Surgeon: Iva BoopGessner, Carl E, MD;  [facility];  Service: Endoscopy;;  . CRANIOTOMY FOR CYST FENESTRATION    . ESOPHAGOGASTRODUODENOSCOPY N/A 01/09/2015   Procedure: ESOPHAGOGASTRODUODENOSCOPY (EGD);  Surgeon: Louis Meckelobert D Kaplan, MD;  [facility];  Service: Endoscopy;  Laterality: N/A;  . ESOPHAGOGASTRODUODENOSCOPY N/A 10/26/2020   Procedure: ESOPHAGOGASTRODUODENOSCOPY (EGD);  Surgeon: Corliss SkainsLightfoot, Harrell O, MD;  [facility];  Service: Thoracic;  Laterality: N/A;  . ESOPHAGOGASTRODUODENOSCOPY (EGD) WITH PROPOFOL N/A 10/21/2020   Procedure:  ESOPHAGOGASTRODUODENOSCOPY (EGD) WITH PROPOFOL;  Surgeon: Iva BoopGessner, Carl E, MD;  [facility];  Service: Endoscopy;  Laterality: N/A;  . IR GASTR TUBE CONVERT GASTR-JEJ PER W/FL MOD SED  11/07/2020  . PEG PLACEMENT N/A 10/26/2020   Procedure: PERCUTANEOUS ENDOSCOPIC GASTROSTOMY (PEG) PLACEMENT;  Surgeon: Corliss SkainsLightfoot, Harrell O, MD;  Location: [facility] OR;  Service: Thoracic;  Laterality: N/A;  . XI ROBOTIC ASSISTED HIATAL HERNIA REPAIR N/A 10/26/2020   Procedure: XI ROBOTIC ASSISTED HIATAL HERNIA REPAIR;  Surgeon: Corliss SkainsLightfoot, Harrell O, MD;  Location: [facility] OR;  Service: Thoracic;  Laterality: N/A;    Vitals:   01/04/21 1055  BP: (!) 157/90  Pulse: (!) 120      Subjective Assessment - 01/04/21 1022    Subjective Patient reports had a fall at his group home 10/21/20 and was in the hospital until mid December. Returned to the group home the 17th of December per caregiver reports. Caregiver reports that he is needing assistance getting dressed, and getting around the facility. Patient was not using RW prior to hospitlization. No falls to report since returning to the group hope. Did have another recent ED visit for syncope on 12/21, but patient denied this. Reports that no more episodes of that have occured recently per patient reports. Has local family that visits approx once a month.    Patient is accompained by: --   Worker from [employer]/Caregiver   Pertinent History GERD, Barrett esophagus, gastroparesis, HTN, Hyperlipidemia, CVA  with L sided deficits, mental retardation, schizophrenia, hypothyroidism    Limitations Walking;Standing;House hold activities    Patient Stated Goals feel more confident in walking    Currently in Pain? No/denies              [facility] PT Assessment - 01/04/21 0001      Assessment   Medical Diagnosis Debility    Referring Provider (PT) Referred by: Mariam Dollar PA-C. Followed by  Sula Soda, MD    Onset Date/Surgical Date 12/07/20    Hand Dominance Right     Prior Therapy Inpatient Rehab      Precautions   Precautions Fall      Restrictions   Weight Bearing Restrictions No      Balance Screen   Has the patient fallen in the past 6 months Yes    How many times? 1    Has the patient had a decrease in activity level because of a fear of falling?  Yes    Is the patient reluctant to leave their home because of a fear of falling?  Yes      Home Environment   Living Environment Group home    Additional Comments Patient currently requires assistnace with getting into/out of bathroom, getting dressed, and ambulation throughout. Has 24/7 supervision at the group home. Patient reports transport chair. Patient has grab bars at the toilet and shower, also report shower chair.      Prior Function   Level of Independence Independent with household mobility without device;Independent with community mobility without device   mobility with the group home (all per caregiver reports)   Vocation On disability      Cognition   Overall Cognitive Status History of cognitive impairments - at baseline      Sensation   Light Touch Impaired by gross assessment    Additional Comments Difficulty determining deficits due to cognitive concerns. Patient reporting dimished sensation on LLE.      Coordination   Gross Motor Movements are Fluid and Coordinated No    Fine Motor Movements are Fluid and Coordinated No    Coordination and Movement Description grossly uncoordinated due L hemiparesis      Posture/Postural Control   Posture/Postural Control Postural limitations    Postural Limitations Rounded Shoulders;Forward head      Tone   Assessment Location Left Lower Extremity      ROM / Strength   AROM / PROM / Strength Strength      Strength   Overall Strength Deficits    Strength Assessment Site Hip;Knee;Ankle    Right/Left Hip Right;Left    Right Hip Flexion 4/5    Right Hip ABduction 4/5    Right Hip ADduction 4/5    Left Hip Flexion 3-/5    Left  Hip ABduction 2-/5    Left Hip ADduction 2-/5    Right/Left Knee Right;Left    Right Knee Flexion 4/5    Right Knee Extension 4/5    Left Knee Flexion 2+/5    Left Knee Extension 3-/5    Right/Left Ankle Right;Left    Right Ankle Dorsiflexion 4/5    Left Ankle Dorsiflexion 2-/5      Bed Mobility   Bed Mobility Rolling Right;Rolling Left;Sit to Supine;Supine to Sit    Rolling Right Independent    Rolling Left Independent    Supine to Sit Contact Guard/Touching assist    Sit to Supine Supervision/Verbal cueing      Transfers   Transfers Sit  to Stand;Stand to Sit    Sit to Stand 4: Min guard    Five time sit to stand comments  18.12 secs with single UE support via RUE. completed from standard chair height. increased impulsivity noted, and increased verbal cues required for proper completion    Stand to Sit 4: Min guard      Ambulation/Gait   Ambulation/Gait Yes    Ambulation/Gait Assistance 4: Min guard;4: Min assist    Ambulation/Gait Assistance Details Completed ambulation with RW with L Hand splint attached. Upon assesment L hand splint was not on correctly, and strap was not attached. PT ambulated with patient into therapy gym with PT providing CGA to Min A times due to imbalance. Patient demonstrating increased neglect on L side, often running into wall on L with RW. PT also providing increased verbal cues to keep aligned within the walker due to patient's LLE often being outside of the walker and patient tripping.    Ambulation Distance (Feet) 60 Feet    Assistive device Rolling walker   with L Hand Grip Attachment   Gait Pattern Step-to pattern;Decreased arm swing - right;Decreased arm swing - left;Decreased step length - right;Decreased step length - left    Ambulation Surface Level;Indoor      Balance   Balance Assessed Yes      Static Sitting Balance   Static Sitting - Balance Support No upper extremity supported    Static Sitting - Level of Assistance 6: Modified  independent (Device/Increase time)    Static Sitting - Comment/# of Minutes patient able to sit on mat without UE support x 5 minutes during session.      Static Standing Balance   Static Standing - Balance Support No upper extremity supported    Static Standing - Level of Assistance 5: Stand by assistance;4: Min assist    Static Standing - Comment/# of Minutes completed standing without UE support, completed with wide BOS x 1 minute. Patient often demo impulsivity, sitting down without notice. Completed trunk rotation to look over shoulder, patient able to complete to R, but unable to complete to L without Min A to maintain balance.      LUE Tone   LUE Tone Hypertonic;Modified Ashworth      LUE Tone   Modified Ashworth Scale for Grading Hypertonia LUE Slight increase in muscle tone, manifested by a catch, followed by minimal resistance throughout the remainder (less than half) of the ROM                      Objective measurements completed on examination: See above findings.               PT Education - 01/04/21 1223    Education Details Patient/Caregiver educated on POC/Evaluation Findings    Person(s) Educated Patient;Caregiver(s)    Methods Explanation    Comprehension Verbalized understanding            PT Short Term Goals - 01/04/21 1708      PT SHORT TERM GOAL #1   Title Patient will be independent with caregiver assistnace with initial HEP for strengthening (All STGS Due: 02/01/21)    Baseline no HEP established    Time 4    Period Weeks    Status New    Target Date 02/01/21      PT SHORT TERM GOAL #2   Title Patient will demonstrate ability to ambulate > 150 ft with CGA with LRAD for improved household mobility  Baseline 60 ft intermittent CGA - Min A    Time 4    Period Weeks    Status New      PT SHORT TERM GOAL #3   Title Patient will be able to stand for >/= 1 minute with supervision to demonstrate improved balance    Baseline < 30  seconds without assistance    Time 4    Period Weeks    Status New             PT Long Term Goals - 01/04/21 1711      PT LONG TERM GOAL #1   Title Patient will be independent with caregiver assistance with Final HEP (All LTGs Due:    Baseline no HEP established    Time 8    Period Weeks    Status New      PT LONG TERM GOAL #2   Title Patient will demonstrate ability to complete all transfers with supervision to demonstrate improved independence and safety.    Baseline CGA to Min A    Time 8    Period Weeks    Status New      PT LONG TERM GOAL #3   Title Patient will be able to stand for >/= 4 minutes without assistance to demonstrate improved balance and tolerance for completion of ADL's    Baseline < 30 secs, CGA due to impulsivity    Time 8    Period Weeks    Status New      PT LONG TERM GOAL #4   Title Patient will demonstrate ability to ambulate > 250 ft with LRAD and close supervision for improved household mobility    Baseline 60 ft CGA to Min A    Time 8    Period Weeks    Status New      PT LONG TERM GOAL #5   Title Patient will improve 5 x sit <> stand to </= 13 seconds to demonstrate reduced fall risk and improved balance    Baseline 18.12    Time 8    Period Weeks    Status New                  Plan - 01/04/21 1705    Clinical Impression Statement Patient is a 65 y.o. male that was referred to Neuro OPPT services for Debility, due to recent syncope and associated fall. EEG suggestive of mild - moderate diffuse encephalopathy. Patient's PMH includes the following: PMH includes GERD, Barrett esophagus, gastroparesis, HTN, Hyperlipidemia, CVA with L sided deficits, mental retardation, schizophrenia, and hypothyroidism. Patient currently resides in a group home with 24/7 supervision, with family member that visits once a month (according to patient). Caregiver states balance and mobility as well as ADL status has declined since fall and  hospitalization. Upon evaluation patient presents with the following impairments: abnormal gait, decreased strength, impaired sensation, impaired cognition, decreased balance, decreased functional mobility, and increased risk for falls. Patient will benefit from skilled PT services to address following impairments, improve independence and reduce caregiver assistance with transfers and ambulation, and overall reduce fall risk for improved safety with household ambulation.    Personal Factors and Comorbidities Comorbidity 3+;Behavior Pattern;Time since onset of injury/illness/exacerbation;Transportation;Age    Comorbidities GERD, Barrett esophagus, gastroparesis, HTN, Hyperlipidemia, CVA with L sided deficits, mental retardation, schizophrenia, hypothyroidism    Examination-Activity Limitations Bathing;Bed Mobility;Transfers;Dressing;Stairs;Stand    Examination-Participation Restrictions Community Activity;Medication Management    Stability/Clinical Decision Making Evolving/Moderate complexity  Clinical Decision Making Moderate    Rehab Potential Fair    PT Frequency 1x / week    PT Duration 8 weeks    PT Treatment/Interventions ADLs/Self Care Home Management;Electrical Stimulation;Cryotherapy;Moist Heat;DME Instruction;Gait training;Stair training;Functional mobility training;Therapeutic activities;Therapeutic exercise;Balance training;Neuromuscular re-education;Patient/family education;Orthotic Fit/Training;Manual techniques;Wheelchair mobility training;Passive range of motion    PT Next Visit Plan Further assess gait with RW and Hand Grip Attachment. Initiate supine HEP for strengthening with caregiver present to help with carryover. work on improved safety with sit <> stand and transfers    Recommended Other Services Occupational Therapy    Consulted and Agree with Plan of Care Patient;Family member/caregiver           Patient will benefit from skilled therapeutic intervention in order to  improve the following deficits and impairments:  Abnormal gait,Decreased coordination,Decreased range of motion,Difficulty walking,Impaired tone,Decreased safety awareness,Decreased endurance,Decreased activity tolerance,Decreased knowledge of use of DME,Impaired perceived functional ability,Impaired vision/preception,Pain,Postural dysfunction,Impaired sensation,Decreased strength,Decreased mobility,Decreased cognition,Decreased balance  Visit Diagnosis: Other abnormalities of gait and mobility  Difficulty in walking, not elsewhere classified  Abnormal posture  Unsteadiness on feet  Muscle weakness (generalized)     Problem List Patient Active Problem List   Diagnosis Date Noted  . Dilation of esophagus   . Hyponatremia   . Dysphagia   . Seizures (HCC)   . S/P percutaneous endoscopic gastrostomy (PEG) tube placement (HCC)   . Debility 11/15/2020  . DM II (diabetes mellitus, type II), controlled (HCC) 11/13/2020  . Bowel obstruction (HCC)   . S/P repair of paraesophageal hernia 10/26/2020  . Syncope 10/21/2020  . Hiatal hernia 10/21/2020  . Acute hypoxemic respiratory failure (HCC) 10/21/2020  . Sinus tachycardia 10/21/2020  . Gastric polyps   . Altered mental status 09/05/2020  . Emesis 07/24/2015  . Coffee ground emesis   . Gastrointestinal hemorrhage associated with other gastritis   . Acute esophagitis 01/09/2015  . Upper gastrointestinal bleed   . Diarrhea   . Nausea with vomiting   . GI bleed 01/08/2015  . GIB (gastrointestinal bleeding) 01/08/2015  . Nausea & vomiting 01/08/2015  . Reflux esophagitis 07/04/2009  . Loss of weight 07/04/2009  . BLOOD IN STOOL, OCCULT 07/04/2009  . ANEMIA, IRON DEFICIENCY 05/04/2008  . Hypothyroidism 04/05/2008  . Schizophrenia, unspecified type (HCC) 04/05/2008  . Depression 04/05/2008  . HEMIPARESIS, LEFT 04/05/2008  . Essential hypertension 04/05/2008  . GERD 04/05/2008  . Gastroparesis 04/05/2008  . Seizure (HCC)  04/05/2008  . History of diverticulosis 08/21/2004  . Barrett's esophagus 04/01/2002  . HIATAL HERNIA 04/01/2002    Tempie Donning, PT, DPT 01/04/2021, 5:21 PM  [facility] [address] [place], [place], [address] Phone: [phone]   Fax:  [phone]  Name: [name] MRN: [id number] Date of Birth: 04-16-1956

## 2021-01-04 NOTE — Therapy (Signed)
[facility] [place] [facility] [address] [place], [place], [address] Phone: [phone]   Fax:  [phone]  Occupational Therapy Evaluation  Patient Details  Name: [name] MRN: [id number] Date of Birth: 06/21/1956 Referring Provider (OT): Dr.Swartz   Encounter Date: 01/04/2021   OT End of Session - 01/04/21 1157    Visit Number 1    Number of Visits 9    Authorization Type MCR primary, MCD secondary    OT Start Time 1100    OT Stop Time 1145    OT Time Calculation (min) 45 min    Activity Tolerance Patient tolerated treatment well    Behavior During Therapy Impulsive           Past Medical History:  Diagnosis Date  . Barrett esophagus   . DM II (diabetes mellitus, type II), controlled (HCC) 11/13/2020  . Dysphagia   . Gastroparesis   . GERD (gastroesophageal reflux disease)   . Hypercholesteremia   . Hypertension   . Iron deficiency anemia   . Mental retardation   . Schizophrenia (HCC)   . Seizure (HCC)   . Stroke [facility])     Past Surgical History:  Procedure Laterality Date  . BIOPSY  10/21/2020   Procedure: BIOPSY;  Surgeon: Iva Boop, MD;  Location: [facility] ENDOSCOPY;  Service: Endoscopy;;  . CRANIOTOMY FOR CYST FENESTRATION    . ESOPHAGOGASTRODUODENOSCOPY N/A 01/09/2015   Procedure: ESOPHAGOGASTRODUODENOSCOPY (EGD);  Surgeon: Louis Meckel, MD;  Location: [facility] ENDOSCOPY;  Service: Endoscopy;  Laterality: N/A;  . ESOPHAGOGASTRODUODENOSCOPY N/A 10/26/2020   Procedure: ESOPHAGOGASTRODUODENOSCOPY (EGD);  Surgeon: Corliss Skains, MD;  Location: [facility] OR;  Service: Thoracic;  Laterality: N/A;  . ESOPHAGOGASTRODUODENOSCOPY (EGD) WITH PROPOFOL N/A 10/21/2020   Procedure: ESOPHAGOGASTRODUODENOSCOPY (EGD) WITH PROPOFOL;  Surgeon: Iva Boop, MD;  Location: [facility] ENDOSCOPY;  Service: Endoscopy;  Laterality: N/A;  . IR GASTR TUBE CONVERT GASTR-JEJ PER W/FL MOD SED  11/07/2020  . PEG PLACEMENT N/A 10/26/2020    Procedure: PERCUTANEOUS ENDOSCOPIC GASTROSTOMY (PEG) PLACEMENT;  Surgeon: Corliss Skains, MD;  Location: [facility] OR;  Service: Thoracic;  Laterality: N/A;  . XI ROBOTIC ASSISTED HIATAL HERNIA REPAIR N/A 10/26/2020   Procedure: XI ROBOTIC ASSISTED HIATAL HERNIA REPAIR;  Surgeon: Corliss Skains, MD;  Location: [facility] OR;  Service: Thoracic;  Laterality: N/A;    There were no vitals filed for this visit.   Subjective Assessment - 01/04/21 1104    Patient is accompanied by: --   caregiver from Group Home   Pertinent History fall on 10/20/20 d/t ? syncope. PMH: T2DM, GERD, HTN, HLD, anemia, CVA w/ residual Lt hemiparesis (> 5 years ago per caregiver report), schizophrenia w/ intellectual delay    Limitations fall risk    Currently in Pain? No/denies             [facility] OT Assessment - 01/04/21 1108      Assessment   Medical Diagnosis Debility    Referring Provider (OT) Dr.Swartz    Onset Date/Surgical Date 12/07/20   10/20/20 fall which led to ED admission   Hand Dominance Right    Prior Therapy Inpatient Rehab      Precautions   Precautions Fall      Restrictions   Weight Bearing Restrictions No      Home  Environment   Additional Comments Lives in Group home since 2012, has 24/7 supervision. Sister comes infrequently. Has walker, w/c, and shower chair, grab bars along walk in shower  Prior Function   Level of Independence --   Caregiver reports he could dress himself prior to fall, sup for bathing   Vocation On disability      ADL   Eating/Feeding Needs assist with cutting food    Grooming Set up   assist for standing (dependent for shaving)   Upper Body Bathing Maximal assistance    Lower Body Bathing + 1 Total assistance    Upper Body Dressing Maximal assistance    Lower Body Dressing +1 Total aassistance    Toilet Transfer Minimal assistance    Toileting - Clothing Manipulation + 1 Total assistance    Toileting -  Hygiene Modified Independent    Tub/Shower  Transfer Minimal assistance    ADL comments sets dryer with help, assistance to stand/for balance, cues to wash hands and cover mouth to sneeze. Caregiver reports decline in BADLS since fall/ hospitalization      Mobility   Mobility Status Needs assist    Mobility Status Comments use walker w/ assistance, Pt goes towards Lt side and drags Lt foot      Written Expression   Dominant Hand Right    Handwriting --   doesn't write but can read     Vision - History   Baseline Vision Wears glasses only for reading    Additional Comments no changes (blurriness with distant). Pt could read name of therapist on badge, close with time      Cognition   Cognition Comments Intellectual delay but can read and answer simple questions appropriately. Pt's caregiver reports he reads but does not write (? comprehension)      Observation/Other Assessments   Observations dysarthria - pt always had since stroke but caregiver reports is worse      Sensation   Light Touch Appears Intact    Additional Comments LUE intact for light touch and localization      Coordination   Coordination limited use LUE, WFL's RUE      Edema   Edema none observed in UE's      Tone   Assessment Location Left Upper Extremity      ROM / Strength   AROM / PROM / Strength AROM      AROM   Overall AROM Comments RUE WFL's. LUE: sh flex (scaption) to approx 50* w/ compensations into sh abduction and IR, elbow flex/ext WFL's but limited by tone, limited active supination (none past neutral), wrist in approx 40* flexion w/ minimal active extension, gross finger flex approx 90%, finger ext approx 50% w/ decreased MP extension      Hand Function   Comment Pt w/ limited hand function but could use as stabalizer to potential gross assist - was using more prior to fall but also limited d/t Lt neglect and cognitive status      LUE Tone   LUE Tone Hypertonic;Moderate;Modified Ashworth      LUE Tone   Modified Ashworth Scale for  Grading Hypertonia LUE Slight increase in muscle tone, manifested by a catch, followed by minimal resistance throughout the remainder (less than half) of the ROM   to 2/4 - can fluctuate                               OT Long Term Goals - 01/04/21 1212      OT LONG TERM GOAL #1   Title Pt/caregiver independent with simple HEP and functional activities to encourage  simple assist from LUE and to increase awareness/attention to Lt side    Time 8    Period Weeks    Status New      OT LONG TERM GOAL #2   Title Pt to perform UE dressing w/ min assist and LE dressing with mod assist from caregiver    Time 8    Period Weeks    Status New      OT LONG TERM GOAL #3   Title Pt to stand and perform clothes management with no more than min assist    Time 8    Period Weeks    Status New      OT LONG TERM GOAL #4   Title Pt to assist w/ bathing 50% including min assist from LUE using A/E prn    Time 8    Period Weeks    Status New      OT LONG TERM GOAL #5   Title Pt to perform toilet and shower transfers consistently with min assist or less and increased safety    Time 8    Period Weeks    Status New                 Plan - 01/04/21 1204    Clinical Impression Statement Pt is a 65 y.o. male who presents to OPOT with diagnosis: debility (after fall on 10/20/20 d/t syncope?). Pt also w/ residual Lt hemiplegia and Lt neglect from CVA years ago. Pt also with intellectual delay, schizophrenia, seizure d/o, T2DM, HTN, HLD, anemia. Pt lives in group home (in [place]) with 24/7 care and requires supervision for all transfers and ambulation w/ walker for safety/fall prevention. Caregiver states balance and mobility as well as ADL status has declined since fall and hospitalization. Pt would benefit from O.T. to address safety w/ BADLS and transfers, increase pt participation and independence with ADLS and increase awareness and simple use of LUE as gross assist.    OT  Occupational Profile and History Detailed Assessment- Review of Records and additional review of physical, cognitive, psychosocial history related to current functional performance    Occupational performance deficits (Please refer to evaluation for details): ADL's    Body Structure / Function / Physical Skills ADL;Dexterity;Tone;UE functional use;ROM;Mobility;Decreased knowledge of precautions;Proprioception;Body mechanics    Cognitive Skills Attention;Safety Awareness   Pt with baseline cognitive deficits due to intellectual delay which O.T. will not be addressing   Rehab Potential Good   for goals set   Clinical Decision Making Several treatment options, min-mod task modification necessary    Comorbidities Affecting Occupational Performance: Presence of comorbidities impacting occupational performance    Comorbidities impacting occupational performance description: Lt hemiplegia and neglect from past stroke, seizure disorder, intellectual delay    Modification or Assistance to Complete Evaluation  Min-Moderate modification of tasks or assist with assess necessary to complete eval    OT Frequency 1x / week    OT Duration 8 weeks   plus eval   OT Treatment/Interventions Self-care/ADL training;DME and/or AE instruction;Splinting;Therapeutic activities;Coping strategies training;Therapeutic exercise;Neuromuscular education;Functional Mobility Training;Patient/family education;Passive range of motion;Visual/perceptual remediation/compensation;Manual Therapy   extensive caregiver education   Plan hemi dressing techniques to increase independence with dressing (caregiver needs to be present). Following session: consider simple functional activities for LUE and 1-2 ex's    Consulted and Agree with Plan of Care Patient           Patient will benefit from skilled therapeutic intervention in order to improve the following  deficits and impairments:   Body Structure / Function / Physical Skills:  ADL,Dexterity,Tone,UE functional use,ROM,Mobility,Decreased knowledge of precautions,Proprioception,Body mechanics Cognitive Skills: Attention,Safety Awareness (Pt with baseline cognitive deficits due to intellectual delay which O.T. will not be addressing)     Visit Diagnosis: Debility  Hemiplegia and hemiparesis following cerebral infarction affecting left non-dominant side (HCC)  Neurological neglect syndrome  Attention and concentration deficit  Unsteadiness on feet    Problem List Patient Active Problem List   Diagnosis Date Noted  . Dilation of esophagus   . Hyponatremia   . Dysphagia   . Seizures (HCC)   . S/P percutaneous endoscopic gastrostomy (PEG) tube placement (HCC)   . Debility 11/15/2020  . DM II (diabetes mellitus, type II), controlled (HCC) 11/13/2020  . Bowel obstruction (HCC)   . S/P repair of paraesophageal hernia 10/26/2020  . Syncope 10/21/2020  . Hiatal hernia 10/21/2020  . Acute hypoxemic respiratory failure (HCC) 10/21/2020  . Sinus tachycardia 10/21/2020  . Gastric polyps   . Altered mental status 09/05/2020  . Emesis 07/24/2015  . Coffee ground emesis   . Gastrointestinal hemorrhage associated with other gastritis   . Acute esophagitis 01/09/2015  . Upper gastrointestinal bleed   . Diarrhea   . Nausea with vomiting   . GI bleed 01/08/2015  . GIB (gastrointestinal bleeding) 01/08/2015  . Nausea & vomiting 01/08/2015  . Reflux esophagitis 07/04/2009  . Loss of weight 07/04/2009  . BLOOD IN STOOL, OCCULT 07/04/2009  . ANEMIA, IRON DEFICIENCY 05/04/2008  . Hypothyroidism 04/05/2008  . Schizophrenia, unspecified type (HCC) 04/05/2008  . Depression 04/05/2008  . HEMIPARESIS, LEFT 04/05/2008  . Essential hypertension 04/05/2008  . GERD 04/05/2008  . Gastroparesis 04/05/2008  . Seizure (HCC) 04/05/2008  . History of diverticulosis 08/21/2004  . Barrett's esophagus 04/01/2002  . HIATAL HERNIA 04/01/2002    Kelli Churn,  OTR/L 01/04/2021, 12:15 PM  [facility] [address] [place], [place], [address] Phone: [phone]   Fax:  [phone]  Name: [name] MRN: [id number] Date of Birth: October 23, 1956

## 2021-01-11 ENCOUNTER — Ambulatory Visit: Payer: Medicare Other | Admitting: Occupational Therapy

## 2021-01-11 ENCOUNTER — Ambulatory Visit: Payer: Medicare Other

## 2021-01-12 ENCOUNTER — Encounter: Payer: Medicare Other | Admitting: Physical Medicine and Rehabilitation

## 2021-01-18 ENCOUNTER — Other Ambulatory Visit: Payer: Self-pay

## 2021-01-18 ENCOUNTER — Encounter: Payer: Self-pay | Admitting: Occupational Therapy

## 2021-01-18 ENCOUNTER — Ambulatory Visit: Payer: Medicare Other

## 2021-01-18 ENCOUNTER — Ambulatory Visit: Payer: Medicare Other | Admitting: Occupational Therapy

## 2021-01-18 VITALS — BP 180/110

## 2021-01-18 VITALS — BP 178/98 | HR 102

## 2021-01-18 DIAGNOSIS — M6281 Muscle weakness (generalized): Secondary | ICD-10-CM

## 2021-01-18 DIAGNOSIS — R262 Difficulty in walking, not elsewhere classified: Secondary | ICD-10-CM

## 2021-01-18 DIAGNOSIS — R2681 Unsteadiness on feet: Secondary | ICD-10-CM

## 2021-01-18 DIAGNOSIS — R293 Abnormal posture: Secondary | ICD-10-CM

## 2021-01-18 DIAGNOSIS — I69354 Hemiplegia and hemiparesis following cerebral infarction affecting left non-dominant side: Secondary | ICD-10-CM

## 2021-01-18 DIAGNOSIS — R414 Neurologic neglect syndrome: Secondary | ICD-10-CM

## 2021-01-18 DIAGNOSIS — R2689 Other abnormalities of gait and mobility: Secondary | ICD-10-CM

## 2021-01-18 DIAGNOSIS — R41840 Attention and concentration deficit: Secondary | ICD-10-CM

## 2021-01-18 NOTE — Therapy (Signed)
[facility] [address] [place], [place], [address] Phone: [phone]   Fax:  [phone]  Physical Therapy Treatment  Patient Details  Name: [name] MRN: [id number] Date of Birth: 01/08/1956 Referring Provider (PT): Referred by: Mariam Dollar PA-C. Followed by  Sula Soda, MD   Encounter Date: 01/18/2021   PT End of Session - 01/18/21 1106    Visit Number 2    Number of Visits 9    Date for PT Re-Evaluation 04/04/21   POC for 8 weeks, Cert for 90 days   Authorization Type Medicare/Medicaid Secondary (follow medicare guidelines)    Progress Note Due on Visit 10    PT Start Time 1106   patient using bathroom prior to session   PT Stop Time 1145    PT Time Calculation (min) 39 min    Equipment Utilized During Treatment Gait belt    Activity Tolerance Patient tolerated treatment well    Behavior During Therapy Impulsive           Past Medical History:  Diagnosis Date  . Barrett esophagus   . DM II (diabetes mellitus, type II), controlled (HCC) 11/13/2020  . Dysphagia   . Gastroparesis   . GERD (gastroesophageal reflux disease)   . Hypercholesteremia   . Hypertension   . Iron deficiency anemia   . Mental retardation   . Schizophrenia (HCC)   . Seizure (HCC)   . Stroke [facility])     Past Surgical History:  Procedure Laterality Date  . BIOPSY  10/21/2020   Procedure: BIOPSY;  Surgeon: Iva Boop, MD;  Location: [facility] ENDOSCOPY;  Service: Endoscopy;;  . CRANIOTOMY FOR CYST FENESTRATION    . ESOPHAGOGASTRODUODENOSCOPY N/A 01/09/2015   Procedure: ESOPHAGOGASTRODUODENOSCOPY (EGD);  Surgeon: Louis Meckel, MD;  Location: [facility] ENDOSCOPY;  Service: Endoscopy;  Laterality: N/A;  . ESOPHAGOGASTRODUODENOSCOPY N/A 10/26/2020   Procedure: ESOPHAGOGASTRODUODENOSCOPY (EGD);  Surgeon: Corliss Skains, MD;  Location: [facility] OR;  Service: Thoracic;  Laterality: N/A;  . ESOPHAGOGASTRODUODENOSCOPY (EGD)  WITH PROPOFOL N/A 10/21/2020   Procedure: ESOPHAGOGASTRODUODENOSCOPY (EGD) WITH PROPOFOL;  Surgeon: Iva Boop, MD;  Location: [facility] ENDOSCOPY;  Service: Endoscopy;  Laterality: N/A;  . IR GASTR TUBE CONVERT GASTR-JEJ PER W/FL MOD SED  11/07/2020  . PEG PLACEMENT N/A 10/26/2020   Procedure: PERCUTANEOUS ENDOSCOPIC GASTROSTOMY (PEG) PLACEMENT;  Surgeon: Corliss Skains, MD;  Location: [facility] OR;  Service: Thoracic;  Laterality: N/A;  . XI ROBOTIC ASSISTED HIATAL HERNIA REPAIR N/A 10/26/2020   Procedure: XI ROBOTIC ASSISTED HIATAL HERNIA REPAIR;  Surgeon: Corliss Skains, MD;  Location: [facility] OR;  Service: Thoracic;  Laterality: N/A;    Vitals:   01/18/21 1113 01/18/21 1123 01/18/21 1139  BP: (!) 168/94 (!) 145/87 (!) 178/98  Pulse: (!) 102 99 (!) 102     Subjective Assessment - 01/18/21 1109    Subjective Patient reports no new changes or complaints since last visit. No falls.    Patient is accompained by: --   Worker from [employer]   Pertinent History GERD, Barrett esophagus, gastroparesis, HTN, Hyperlipidemia, CVA with L sided deficits, mental retardation, schizophrenia, hypothyroidism    Limitations Walking;Standing;House hold activities    Patient Stated Goals feel more confident in walking    Currently in Pain? No/denies               [facility] Adult PT Treatment/Exercise - 01/18/21 0001      Transfers   Transfers Sit to Stand;Stand to Sit  Sit to Stand 4: Min guard;4: Min assist    Sit to Stand Details Tactile cues for weight shifting;Tactile cues for posture;Verbal cues for precautions/safety;Verbal cues for technique;Verbal cues for sequencing;Verbal cues for safe use of DME/AE;Manual facilitation for weight shifting    Stand to Sit 4: Min guard;4: Min assist    Stand to Sit Details (indicate cue type and reason) Verbal cues for technique;Verbal cues for precautions/safety;Verbal cues for sequencing;Manual facilitation for weight shifting;Tactile cues for  posture    Comments completed sit <> stand training, 2 x 10 reps. intermittent rest breaks required due to fatigue.      Ambulation/Gait   Ambulation/Gait Yes    Ambulation/Gait Assistance 4: Min guard    Ambulation/Gait Assistance Details ambulation into/out of therapy session, very impulsive, often attempting to walk without RW. Patient not ulizing hand grip attachment on RW appropriately.    Ambulation Distance (Feet) 50 Feet    Assistive device Rolling walker    Gait Pattern Step-to pattern;Decreased arm swing - right;Decreased arm swing - left;Decreased step length - right;Decreased step length - left    Ambulation Surface Level;Indoor      Exercises   Exercises Other Exercises    Other Exercises  Initiated Strengthening HEP. PT educating on caregiver assistnace required for proper completion.           Completed all of the following exercises and established initial HEP. Increased verbal cues required for proper completion. PT addressing any concerns of patient and caregiver. Max cues to ensure caregiver is present during completion of exercises for safety.   Access Code: [email or web address] URL: [email or web address] Date: 01/18/2021 Prepared by: Jethro Bastos  Exercises Supine Bridge - 1 x daily - 5 x weekly - 2 sets - 10 reps Bent Knee Fallouts - 1 x daily - 5 x weekly - 2 sets - 10 reps Supine March with Resistance Band - 1 x daily - 5 x weekly - 2 sets - 10 reps Sit to Stand with Armchair - 1 x daily - 5 x weekly - 2 sets - 10 reps        PT Education - 01/18/21 1106    Education Details Educated on initial HEP    Person(s) Educated Patient;Caregiver(s)    Methods Explanation;Demonstration;Handout    Comprehension Verbalized understanding;Returned demonstration            PT Short Term Goals - 01/04/21 1708      PT SHORT TERM GOAL #1   Title Patient will be independent with caregiver assistnace with initial HEP for strengthening (All STGS Due: 02/01/21)     Baseline no HEP established    Time 4    Period Weeks    Status New    Target Date 02/01/21      PT SHORT TERM GOAL #2   Title Patient will demonstrate ability to ambulate > 150 ft with CGA with LRAD for improved household mobility    Baseline 60 ft intermittent CGA - Min A    Time 4    Period Weeks    Status New      PT SHORT TERM GOAL #3   Title Patient will be able to stand for >/= 1 minute with supervision to demonstrate improved balance    Baseline < 30 seconds without assistance    Time 4    Period Weeks    Status New             PT Long Term Goals - 01/04/21 1711  PT LONG TERM GOAL #1   Title Patient will be independent with caregiver assistance with Final HEP (All LTGs Due:    Baseline no HEP established    Time 8    Period Weeks    Status New      PT LONG TERM GOAL #2   Title Patient will demonstrate ability to complete all transfers with supervision to demonstrate improved independence and safety.    Baseline CGA to Min A    Time 8    Period Weeks    Status New      PT LONG TERM GOAL #3   Title Patient will be able to stand for >/= 4 minutes without assistance to demonstrate improved balance and tolerance for completion of ADL's    Baseline < 30 secs, CGA due to impulsivity    Time 8    Period Weeks    Status New      PT LONG TERM GOAL #4   Title Patient will demonstrate ability to ambulate > 250 ft with LRAD and close supervision for improved household mobility    Baseline 60 ft CGA to Min A    Time 8    Period Weeks    Status New      PT LONG TERM GOAL #5   Title Patient will improve 5 x sit <> stand to </= 13 seconds to demonstrate reduced fall risk and improved balance    Baseline 18.12    Time 8    Period Weeks    Status New                 Plan - 01/18/21 1156    Clinical Impression Statement Today's session focused on establishing initial HEP for strengthening in supine position. Patient require max cues for proper  completion. PT edcuating caregiver on proper completion and provided handout. Limtied during session due to elevated BP require intermittent rest breaks throughout. Patient continue to demo increased impulsivity with completion. Will continue to progress toward all LTGs.    Personal Factors and Comorbidities Comorbidity 3+;Behavior Pattern;Time since onset of injury/illness/exacerbation;Transportation;Age    Comorbidities GERD, Barrett esophagus, gastroparesis, HTN, Hyperlipidemia, CVA with L sided deficits, mental retardation, schizophrenia, hypothyroidism    Examination-Activity Limitations Bathing;Bed Mobility;Transfers;Dressing;Stairs;Stand    Examination-Participation Restrictions Community Activity;Medication Management    Stability/Clinical Decision Making Evolving/Moderate complexity    Rehab Potential Fair    PT Frequency 1x / week    PT Duration 8 weeks    PT Treatment/Interventions ADLs/Self Care Home Management;Electrical Stimulation;Cryotherapy;Moist Heat;DME Instruction;Gait training;Stair training;Functional mobility training;Therapeutic activities;Therapeutic exercise;Balance training;Neuromuscular re-education;Patient/family education;Orthotic Fit/Training;Manual techniques;Wheelchair mobility training;Passive range of motion    PT Next Visit Plan Further assess gait with RW and Hand Grip Attachment. How was HEP? continue safety with transfer training.    Consulted and Agree with Plan of Care Patient;Family member/caregiver           Patient will benefit from skilled therapeutic intervention in order to improve the following deficits and impairments:  Abnormal gait,Decreased coordination,Decreased range of motion,Difficulty walking,Impaired tone,Decreased safety awareness,Decreased endurance,Decreased activity tolerance,Decreased knowledge of use of DME,Impaired perceived functional ability,Impaired vision/preception,Pain,Postural dysfunction,Impaired sensation,Decreased  strength,Decreased mobility,Decreased cognition,Decreased balance  Visit Diagnosis: Difficulty in walking, not elsewhere classified  Abnormal posture  Muscle weakness (generalized)  Unsteadiness on feet  Other abnormalities of gait and mobility     Problem List Patient Active Problem List   Diagnosis Date Noted  . Dilation of esophagus   . Hyponatremia   . Dysphagia   .  Seizures (HCC)   . S/P percutaneous endoscopic gastrostomy (PEG) tube placement (HCC)   . Debility 11/15/2020  . DM II (diabetes mellitus, type II), controlled (HCC) 11/13/2020  . Bowel obstruction (HCC)   . S/P repair of paraesophageal hernia 10/26/2020  . Syncope 10/21/2020  . Hiatal hernia 10/21/2020  . Acute hypoxemic respiratory failure (HCC) 10/21/2020  . Sinus tachycardia 10/21/2020  . Gastric polyps   . Altered mental status 09/05/2020  . Emesis 07/24/2015  . Coffee ground emesis   . Gastrointestinal hemorrhage associated with other gastritis   . Acute esophagitis 01/09/2015  . Upper gastrointestinal bleed   . Diarrhea   . Nausea with vomiting   . GI bleed 01/08/2015  . GIB (gastrointestinal bleeding) 01/08/2015  . Nausea & vomiting 01/08/2015  . Reflux esophagitis 07/04/2009  . Loss of weight 07/04/2009  . BLOOD IN STOOL, OCCULT 07/04/2009  . ANEMIA, IRON DEFICIENCY 05/04/2008  . Hypothyroidism 04/05/2008  . Schizophrenia, unspecified type (HCC) 04/05/2008  . Depression 04/05/2008  . HEMIPARESIS, LEFT 04/05/2008  . Essential hypertension 04/05/2008  . GERD 04/05/2008  . Gastroparesis 04/05/2008  . Seizure (HCC) 04/05/2008  . History of diverticulosis 08/21/2004  . Barrett's esophagus 04/01/2002  . HIATAL HERNIA 04/01/2002    Tempie Donning, PT, DPT 01/18/2021, 12:03 PM  [facility] [address] [place], [place], [address] Phone: [phone]   Fax:  [phone]  Name: [name] MRN: [id number] Date of  Birth: 1956-10-27

## 2021-01-18 NOTE — Therapy (Signed)
[facility] [address] [place], [place], [address] Phone: [phone]   Fax:  [phone]  Occupational Therapy Treatment  Patient Details  Name: [name] MRN: [id number] Date of Birth: [birth year] Referring Provider (OT): Dr.Swartz   Encounter Date: 01/18/2021   OT End of Session - 01/18/21 1333    Visit Number 2    Number of Visits 9    Authorization Type MCR primary, MCD secondary    OT Start Time 1145    OT Stop Time 1230    OT Time Calculation (min) 45 min    Activity Tolerance Treatment limited secondary to medical complications (Comment)    Behavior During Therapy Anxious           Past Medical History:  Diagnosis Date  . Barrett esophagus   . DM II (diabetes mellitus, type II), controlled (HCC) 11/13/2020  . Dysphagia   . Gastroparesis   . GERD (gastroesophageal reflux disease)   . Hypercholesteremia   . Hypertension   . Iron deficiency anemia   . Mental retardation   . Schizophrenia (HCC)   . Seizure (HCC)   . Stroke [facility])     Past Surgical History:  Procedure Laterality Date  . BIOPSY  10/21/2020   Procedure: BIOPSY;  Surgeon: Iva Boop, MD;  Location: [facility] ENDOSCOPY;  Service: Endoscopy;;  . CRANIOTOMY FOR CYST FENESTRATION    . ESOPHAGOGASTRODUODENOSCOPY N/A 01/09/2015   Procedure: ESOPHAGOGASTRODUODENOSCOPY (EGD);  Surgeon: Louis Meckel, MD;  Location: [facility] ENDOSCOPY;  Service: Endoscopy;  Laterality: N/A;  . ESOPHAGOGASTRODUODENOSCOPY N/A 10/26/2020   Procedure: ESOPHAGOGASTRODUODENOSCOPY (EGD);  Surgeon: Corliss Skains, MD;  Location: [facility] OR;  Service: Thoracic;  Laterality: N/A;  . ESOPHAGOGASTRODUODENOSCOPY (EGD) WITH PROPOFOL N/A 10/21/2020   Procedure: ESOPHAGOGASTRODUODENOSCOPY (EGD) WITH PROPOFOL;  Surgeon: Iva Boop, MD;  Location: [facility] ENDOSCOPY;  Service: Endoscopy;  Laterality: N/A;  . IR GASTR TUBE CONVERT GASTR-JEJ PER W/FL MOD SED  11/07/2020  .  PEG PLACEMENT N/A 10/26/2020   Procedure: PERCUTANEOUS ENDOSCOPIC GASTROSTOMY (PEG) PLACEMENT;  Surgeon: Corliss Skains, MD;  Location: [facility] OR;  Service: Thoracic;  Laterality: N/A;  . XI ROBOTIC ASSISTED HIATAL HERNIA REPAIR N/A 10/26/2020   Procedure: XI ROBOTIC ASSISTED HIATAL HERNIA REPAIR;  Surgeon: Corliss Skains, MD;  Location: [facility] OR;  Service: Thoracic;  Laterality: N/A;    Vitals:   01/18/21 1205 01/18/21 1224  BP: 140/76 (!) 180/110     Subjective Assessment - 01/18/21 1150    Subjective  Patient indicates he would like to use left hand better - nodding agreement                        OT Treatments/Exercises (OP) - 01/18/21 1325      ADLs   UB Dressing Patient refused to practice donning/ doffing shirt in therapy, "stranger"  Patient then refused to remove shoes - same rationale.  When group home worker returned to room, he was more directive with patient, and then patient willing to remove and don shoes - using RUE predominantly.  Patient did use hemi dressing techniques to don / doff jacket with min assist and increased time.      Neurological Re-education Exercises   Other Exercises 1 Worked on forced use of LUE - to turn on/off lightswitch, to pick up 1 inch blocks.  Patient uses excessive effort to move left arm - breath holding, significant right weight shift, and increased tension.  Patient with elevated BP this session - need to monitor.                  OT Education - 01/18/21 1332    Education Details encouraged patient and caregiver/group home employee to use LUE functionally whenever possible - to turn on light switches, open doors, pick up objects    Person(s) Educated Patient;Caregiver(s)    Methods Explanation    Comprehension Need further instruction               OT Long Term Goals - 01/18/21 1338      OT LONG TERM GOAL #1   Title Pt/caregiver independent with simple HEP and functional activities to encourage simple  assist from LUE and to increase awareness/attention to Lt side    Time 8    Period Weeks    Status On-going      OT LONG TERM GOAL #2   Title Pt to perform UE dressing w/ min assist and LE dressing with mod assist from caregiver    Time 8    Period Weeks    Status On-going      OT LONG TERM GOAL #3   Title Pt to stand and perform clothes management with no more than min assist    Time 8    Period Weeks    Status On-going      OT LONG TERM GOAL #4   Title Pt to assist w/ bathing 50% including min assist from LUE using A/E prn    Time 8    Period Weeks    Status On-going      OT LONG TERM GOAL #5   Title Pt to perform toilet and shower transfers consistently with min assist or less and increased safety    Time 8    Period Weeks    Status On-going                 Plan - 01/18/21 1334    Clinical Impression Statement Patient with very high BP this session, with minimal activity.  Patient with left inattention and excessive activation with effort to move left arm.    OT Occupational Profile and History Detailed Assessment- Review of Records and additional review of physical, cognitive, psychosocial history related to current functional performance    Occupational performance deficits (Please refer to evaluation for details): ADL's    Body Structure / Function / Physical Skills ADL;Dexterity;Tone;UE functional use;ROM;Mobility;Decreased knowledge of precautions;Proprioception;Body mechanics    Cognitive Skills Attention;Safety Awareness    Rehab Potential Good    Clinical Decision Making Several treatment options, min-mod task modification necessary    Comorbidities Affecting Occupational Performance: Presence of comorbidities impacting occupational performance    Comorbidities impacting occupational performance description: Lt hemiplegia and neglect from past stroke, seizure disorder, intellectual delay    Modification or Assistance to Complete Evaluation  Min-Moderate  modification of tasks or assist with assess necessary to complete eval    OT Frequency 1x / week    OT Duration 8 weeks    OT Treatment/Interventions Self-care/ADL training;DME and/or AE instruction;Splinting;Therapeutic activities;Coping strategies training;Therapeutic exercise;Neuromuscular education;Functional Mobility Training;Patient/family education;Passive range of motion;Visual/perceptual remediation/compensation;Manual Therapy    Plan Forced use of LUE - did well with tabletop simple exercises    Consulted and Agree with Plan of Care Patient           Patient will benefit from skilled therapeutic intervention in order to improve the following deficits and impairments:   Body  Structure / Function / Physical Skills: ADL,Dexterity,Tone,UE functional use,ROM,Mobility,Decreased knowledge of precautions,Proprioception,Body mechanics Cognitive Skills: Attention,Safety Awareness     Visit Diagnosis: Abnormal posture  Muscle weakness (generalized)  Unsteadiness on feet  Hemiplegia and hemiparesis following cerebral infarction affecting left non-dominant side [facility])  Neurological neglect syndrome  Attention and concentration deficit    Problem List Patient Active Problem List   Diagnosis Date Noted  . Dilation of esophagus   . Hyponatremia   . Dysphagia   . Seizures (HCC)   . S/P percutaneous endoscopic gastrostomy (PEG) tube placement (HCC)   . Debility 11/15/2020  . DM II (diabetes mellitus, type II), controlled (HCC) 11/13/2020  . Bowel obstruction (HCC)   . S/P repair of paraesophageal hernia 10/26/2020  . Syncope 10/21/2020  . Hiatal hernia 10/21/2020  . Acute hypoxemic respiratory failure (HCC) 10/21/2020  . Sinus tachycardia 10/21/2020  . Gastric polyps   . Altered mental status 09/05/2020  . Emesis 07/24/2015  . Coffee ground emesis   . Gastrointestinal hemorrhage associated with other gastritis   . Acute esophagitis 01/09/2015  . Upper gastrointestinal  bleed   . Diarrhea   . Nausea with vomiting   . GI bleed 01/08/2015  . GIB (gastrointestinal bleeding) 01/08/2015  . Nausea & vomiting 01/08/2015  . Reflux esophagitis 07/04/2009  . Loss of weight 07/04/2009  . BLOOD IN STOOL, OCCULT 07/04/2009  . ANEMIA, IRON DEFICIENCY 05/04/2008  . Hypothyroidism 04/05/2008  . Schizophrenia, unspecified type (HCC) 04/05/2008  . Depression 04/05/2008  . HEMIPARESIS, LEFT 04/05/2008  . Essential hypertension 04/05/2008  . GERD 04/05/2008  . Gastroparesis 04/05/2008  . Seizure (HCC) 04/05/2008  . History of diverticulosis 08/21/2004  . Barrett's esophagus 04/01/2002  . HIATAL HERNIA 04/01/2002    Collier Salina, OTR/L 01/18/2021, 1:39 PM  [facility] [address] [place], [place], [address] Phone: [phone]   Fax:  [phone]  Name: CECILIA NISHIKAWA MRN: [id number] Date of Birth: [birth year]

## 2021-01-18 NOTE — Patient Instructions (Signed)
Access Code: [email or web address] URL: [email or web address] Date: 01/18/2021 Prepared by: Jethro Bastos  Exercises Supine Bridge - 1 x daily - 5 x weekly - 2 sets - 10 reps Bent Knee Fallouts - 1 x daily - 5 x weekly - 2 sets - 10 reps Supine March with Resistance Band - 1 x daily - 5 x weekly - 2 sets - 10 reps Sit to Stand with Armchair - 1 x daily - 5 x weekly - 2 sets - 10 reps

## 2021-01-24 ENCOUNTER — Other Ambulatory Visit: Payer: Self-pay

## 2021-01-24 ENCOUNTER — Ambulatory Visit: Payer: Medicare Other | Admitting: Occupational Therapy

## 2021-01-24 ENCOUNTER — Ambulatory Visit: Payer: Medicare Other | Attending: Physician Assistant | Admitting: Physical Therapy

## 2021-01-24 ENCOUNTER — Encounter: Payer: Self-pay | Admitting: Physical Therapy

## 2021-01-24 VITALS — BP 132/88 | HR 125

## 2021-01-24 DIAGNOSIS — R2681 Unsteadiness on feet: Secondary | ICD-10-CM | POA: Insufficient documentation

## 2021-01-24 DIAGNOSIS — R414 Neurologic neglect syndrome: Secondary | ICD-10-CM | POA: Insufficient documentation

## 2021-01-24 DIAGNOSIS — I69354 Hemiplegia and hemiparesis following cerebral infarction affecting left non-dominant side: Secondary | ICD-10-CM | POA: Diagnosis present

## 2021-01-24 DIAGNOSIS — R2689 Other abnormalities of gait and mobility: Secondary | ICD-10-CM | POA: Diagnosis present

## 2021-01-24 DIAGNOSIS — R41840 Attention and concentration deficit: Secondary | ICD-10-CM

## 2021-01-24 DIAGNOSIS — R293 Abnormal posture: Secondary | ICD-10-CM | POA: Insufficient documentation

## 2021-01-24 DIAGNOSIS — R262 Difficulty in walking, not elsewhere classified: Secondary | ICD-10-CM | POA: Insufficient documentation

## 2021-01-24 DIAGNOSIS — M6281 Muscle weakness (generalized): Secondary | ICD-10-CM | POA: Insufficient documentation

## 2021-01-24 NOTE — Patient Instructions (Signed)
  DRESSING TECHNIQUES:   Make sure shirt is not tight but looser fit and stretchy material.  Easier if shirt has bigger neck and short sleeves. Tight neck and long sleeves will be harder Always put in weaker side first and take out last (with shirt AND pants)  When getting dressed in the am, put pants on up to thighs (Lt leg in first) while seated, then put on socks and shoes, then stand to pull up pants - only have to stand once  When putting shirt on:   1) Put Lt arm in first all the way and pull up above elbow with Rt hand 2) Put Rt arm in and lift arm towards ceiling to bring shirt above elbow 3) Gather shirt and neck hole and pull over head 4) Pull down under Lt arm, Rt arm and behind back  When taking shirt off:   1) Gather shirt behind head/back and pull off head first 2) Then pull off Rt arm by sliding along thigh 3) Then pull off Lt arm with Rt hand  When putting jacket on/off:  Take off Rt arm first Put on Lt arm first

## 2021-01-24 NOTE — Therapy (Signed)
[facility] [address] [place], [place], [address] Phone: [phone]   Fax:  [phone]  Occupational Therapy Treatment  Patient Details  Name: [name] MRN: [id number] Date of Birth: 10-23-1956 Referring Provider (OT): Dr.Swartz   Encounter Date: 01/24/2021   OT End of Session - 01/24/21 1409    Visit Number 3    Number of Visits 9    Authorization Type MCR primary, MCD secondary    OT Start Time 53    OT Stop Time 1315    OT Time Calculation (min) 45 min    Activity Tolerance Treatment limited secondary to medical complications (Comment)    Behavior During Therapy Anxious           Past Medical History:  Diagnosis Date  . Barrett esophagus   . DM II (diabetes mellitus, type II), controlled ([place]) 11/13/2020  . Dysphagia   . Gastroparesis   . GERD (gastroesophageal reflux disease)   . Hypercholesteremia   . Hypertension   . Iron deficiency anemia   . Mental retardation   . Schizophrenia ([place])   . Seizure ([place])   . Stroke [facility])     Past Surgical History:  Procedure Laterality Date  . BIOPSY  10/21/2020   Procedure: BIOPSY;  Surgeon: Gatha Mayer, MD;  Location: [place];  Service: Endoscopy;;  . CRANIOTOMY FOR CYST FENESTRATION    . ESOPHAGOGASTRODUODENOSCOPY N/A 01/09/2015   Procedure: ESOPHAGOGASTRODUODENOSCOPY (EGD);  Surgeon: Inda Castle, MD;  Location: [place];  Service: Endoscopy;  Laterality: N/A;  . ESOPHAGOGASTRODUODENOSCOPY N/A 10/26/2020   Procedure: ESOPHAGOGASTRODUODENOSCOPY (EGD);  Surgeon: Lajuana Matte, MD;  Location[facility] OR;  Service: Thoracic;  Laterality: N/A;  . ESOPHAGOGASTRODUODENOSCOPY (EGD) WITH PROPOFOL N/A 10/21/2020   Procedure: ESOPHAGOGASTRODUODENOSCOPY (EGD) WITH PROPOFOL;  Surgeon: Gatha Mayer, MD;  Location: [place];  Service: Endoscopy;  Laterality: N/A;  . IR GASTR TUBE CONVERT GASTR-JEJ PER W/FL MOD SED  11/07/2020  . PEG  PLACEMENT N/A 10/26/2020   Procedure: PERCUTANEOUS ENDOSCOPIC GASTROSTOMY (PEG) PLACEMENT;  Surgeon: Lajuana Matte, MD;  Location: [place];  Service: Thoracic;  Laterality: N/A;  . XI ROBOTIC ASSISTED HIATAL HERNIA REPAIR N/A 10/26/2020   Procedure: XI ROBOTIC ASSISTED HIATAL HERNIA REPAIR;  Surgeon: Lajuana Matte, MD;  Location: [place];  Service: Thoracic;  Laterality: N/A;    There were no vitals filed for this visit.   Subjective Assessment - 01/24/21 1234    Subjective  Pt in agreement with practicing dressing today    Patient is accompanied by: --   caregiver from [place]   Pertinent History fall on 10/20/20 d/t ? syncope. PMH: T2DM, GERD, HTN, HLD, anemia, CVA w/ residual Lt hemiparesis (> 5 years ago per caregiver report), schizophrenia w/ intellectual delay    Limitations fall risk    Currently in Pain? No/denies            Caregiver present during session for education. Pt practiced doffing jacket I'ly. Pt doffed pull over shirt w/ min cues, donned with min assist and mod cues to fully attend to LUE and pull up above elbow. Pt doffed/donned pants over hips for clothes management in toileting with supervision. Pt donned/doffed socks with min cues for donning socks and donned/doffed slip on shoes.  Pt/caregiver shown how to use shoe buttons for lace up tennis shoes and therapist demo (pt/caregiver provided w/ handout for this and bath mitt and told how/where to purchase).  Caregiver instructed in  proper use of walker splint and added strap to hold Lt hand in better and adjusted positioning of splint. Pt needs cues when walking w/ walker as pt does not attend to Lt side                      OT Education - 01/24/21 1305    Education Details Dressing techniques, A/E recommendations (bath mitt and shoe buttons)    Person(s) Educated Patient;Caregiver(s)    Methods Explanation;Demonstration;Handout;Verbal cues    Comprehension Verbalized  understanding;Returned demonstration;Verbal cues required               OT Long Term Goals - 01/24/21 1409      OT LONG TERM GOAL #1   Title Pt/caregiver independent with simple HEP and functional activities to encourage simple assist from LUE and to increase awareness/attention to Lt side    Time 8    Period Weeks    Status On-going      OT LONG TERM GOAL #2   Title Pt to perform UE dressing w/ min assist and LE dressing with mod assist from caregiver    Time 8    Period Weeks    Status Achieved   met in clinic - therapist stressed importance of consistentcy  b/t caregivers for carryover     OT LONG TERM GOAL #3   Title Pt to stand and perform clothes management with no more than min assist    Time 8    Period Weeks    Status Achieved      OT LONG TERM GOAL #4   Title Pt to assist w/ bathing 50% including min assist from LUE using A/E prn    Time 8    Period Weeks    Status On-going      OT LONG TERM GOAL #5   Title Pt to perform toilet and shower transfers consistently with min assist or less and increased safety    Time 8    Period Weeks    Status On-going                 Plan - 01/24/21 1411    Clinical Impression Statement Pt met LTG's #2 and #3. Pt more cooperative today with dressing and was able to perform with mod cues to attend to Lt side and min assist prn    OT Occupational Profile and History Detailed Assessment- Review of Records and additional review of physical, cognitive, psychosocial history related to current functional performance    Occupational performance deficits (Please refer to evaluation for details): ADL's    Body Structure / Function / Physical Skills ADL;Dexterity;Tone;UE functional use;ROM;Mobility;Decreased knowledge of precautions;Proprioception;Body mechanics    Cognitive Skills Attention;Safety Awareness    Rehab Potential Good    Clinical Decision Making Several treatment options, min-mod task modification necessary     Comorbidities Affecting Occupational Performance: Presence of comorbidities impacting occupational performance    Comorbidities impacting occupational performance description: Lt hemiplegia and neglect from past stroke, seizure disorder, intellectual delay    Modification or Assistance to Complete Evaluation  Min-Moderate modification of tasks or assist with assess necessary to complete eval    OT Frequency 1x / week    OT Duration 8 weeks    OT Treatment/Interventions Self-care/ADL training;DME and/or AE instruction;Splinting;Therapeutic activities;Coping strategies training;Therapeutic exercise;Neuromuscular education;Functional Mobility Training;Patient/family education;Passive range of motion;Visual/perceptual remediation/compensation;Manual Therapy    Plan Forced use of LUE - did well with tabletop simple exercises - address LTG #  1    Consulted and Agree with Plan of Care Patient           Patient will benefit from skilled therapeutic intervention in order to improve the following deficits and impairments:   Body Structure / Function / Physical Skills: ADL,Dexterity,Tone,UE functional use,ROM,Mobility,Decreased knowledge of precautions,Proprioception,Body mechanics Cognitive Skills: Attention,Safety Awareness     Visit Diagnosis: Hemiplegia and hemiparesis following cerebral infarction affecting left non-dominant side (HCC)  Neurological neglect syndrome  Attention and concentration deficit  Unsteadiness on feet    Problem List Patient Active Problem List   Diagnosis Date Noted  . Dilation of esophagus   . Hyponatremia   . Dysphagia   . Seizures ([place])   . S/P percutaneous endoscopic gastrostomy (PEG) tube placement ([place])   . Debility 11/15/2020  . DM II (diabetes mellitus, type II), controlled ([place]) 11/13/2020  . Bowel obstruction ([place])   . S/P repair of paraesophageal hernia 10/26/2020  . Syncope 10/21/2020  . Hiatal hernia 10/21/2020  . Acute hypoxemic respiratory  failure ([place]) 10/21/2020  . Sinus tachycardia 10/21/2020  . Gastric polyps   . Altered mental status 09/05/2020  . Emesis 07/24/2015  . Coffee ground emesis   . Gastrointestinal hemorrhage associated with other gastritis   . Acute esophagitis 01/09/2015  . Upper gastrointestinal bleed   . Diarrhea   . Nausea with vomiting   . GI bleed 01/08/2015  . GIB (gastrointestinal bleeding) 01/08/2015  . Nausea & vomiting 01/08/2015  . Reflux esophagitis 07/04/2009  . Loss of weight 07/04/2009  . BLOOD IN STOOL, OCCULT 07/04/2009  . ANEMIA, IRON DEFICIENCY 05/04/2008  . Hypothyroidism 04/05/2008  . Schizophrenia, unspecified type ([place]) 04/05/2008  . Depression 04/05/2008  . HEMIPARESIS, LEFT 04/05/2008  . Essential hypertension 04/05/2008  . GERD 04/05/2008  . Gastroparesis 04/05/2008  . Seizure ([place]) 04/05/2008  . History of diverticulosis 08/21/2004  . Barrett's esophagus 04/01/2002  . HIATAL HERNIA 04/01/2002    Carey Bullocks, OTR/L 01/24/2021, 2:13 PM  [facility] [address] [place], [place], [address] Phone: [phone]   Fax:  [phone]  Name: ANAV LAMMERT MRN: [id number] Date of Birth: October 14, 1956

## 2021-01-25 NOTE — Therapy (Signed)
[facility] [address] [place], [place], [address] Phone: [phone]   Fax:  [phone]  Physical Therapy Treatment  Patient Details  Name: [name] MRN: [id number] Date of Birth: 11-13-1956 Referring Provider (PT): Referred by: Mariam Dollar PA-C. Followed by  Sula Soda, MD   Encounter Date: 01/24/2021     01/24/21 1322  PT Visits / Re-Eval  Visit Number 3  Number of Visits 9  Date for PT Re-Evaluation 04/04/21 (POC for 8 weeks, Cert for 90 days)  Authorization  Authorization Type Medicare/Medicaid Secondary (follow medicare guidelines)  Progress Note Due on Visit 10  PT Time Calculation  PT Start Time 1317  PT Stop Time 1400  PT Time Calculation (min) 43 min  PT - End of Session  Equipment Utilized During Treatment Gait belt  Activity Tolerance Patient tolerated treatment well  Behavior During Therapy Impulsive;WFL for tasks assessed/performed    Past Medical History:  Diagnosis Date  . Barrett esophagus   . DM II (diabetes mellitus, type II), controlled (HCC) 11/13/2020  . Dysphagia   . Gastroparesis   . GERD (gastroesophageal reflux disease)   . Hypercholesteremia   . Hypertension   . Iron deficiency anemia   . Mental retardation   . Schizophrenia (HCC)   . Seizure (HCC)   . Stroke [facility])     Past Surgical History:  Procedure Laterality Date  . BIOPSY  10/21/2020   Procedure: BIOPSY;  Surgeon: Iva Boop, MD;  Location: [facility] ENDOSCOPY;  Service: Endoscopy;;  . CRANIOTOMY FOR CYST FENESTRATION    . ESOPHAGOGASTRODUODENOSCOPY N/A 01/09/2015   Procedure: ESOPHAGOGASTRODUODENOSCOPY (EGD);  Surgeon: Louis Meckel, MD;  Location: [facility] ENDOSCOPY;  Service: Endoscopy;  Laterality: N/A;  . ESOPHAGOGASTRODUODENOSCOPY N/A 10/26/2020   Procedure: ESOPHAGOGASTRODUODENOSCOPY (EGD);  Surgeon: Corliss Skains, MD;  Location: [facility] OR;  Service: Thoracic;  Laterality: N/A;  .  ESOPHAGOGASTRODUODENOSCOPY (EGD) WITH PROPOFOL N/A 10/21/2020   Procedure: ESOPHAGOGASTRODUODENOSCOPY (EGD) WITH PROPOFOL;  Surgeon: Iva Boop, MD;  Location: [facility] ENDOSCOPY;  Service: Endoscopy;  Laterality: N/A;  . IR GASTR TUBE CONVERT GASTR-JEJ PER W/FL MOD SED  11/07/2020  . PEG PLACEMENT N/A 10/26/2020   Procedure: PERCUTANEOUS ENDOSCOPIC GASTROSTOMY (PEG) PLACEMENT;  Surgeon: Corliss Skains, MD;  Location: [facility] OR;  Service: Thoracic;  Laterality: N/A;  . XI ROBOTIC ASSISTED HIATAL HERNIA REPAIR N/A 10/26/2020   Procedure: XI ROBOTIC ASSISTED HIATAL HERNIA REPAIR;  Surgeon: Corliss Skains, MD;  Location: [facility] OR;  Service: Thoracic;  Laterality: N/A;    Vitals:   01/24/21 1321 01/24/21 1331 01/24/21 1341 01/24/21 1354  BP: (!) 157/92 (!) 144/92 (!) 134/91 132/88  Pulse: (!) 119 (!) 123 (!) 118 (!) 125       01/24/21 1320  Symptoms/Limitations  Subjective No new complaints. No falls or pain to report. HEP is going well at home.  Pertinent History GERD, Barrett esophagus, gastroparesis, HTN, Hyperlipidemia, CVA with L sided deficits, mental retardation, schizophrenia, hypothyroidism  Limitations Walking;Standing;House hold activities  Patient Stated Goals feel more confident in walking  Pain Assessment  Currently in Pain? No/denies        01/24/21 1322  Transfers  Transfers Sit to Stand;Stand to Sit  Sit to Stand 4: Min guard;4: Min assist  Sit to Stand Details Tactile cues for weight shifting;Tactile cues for posture;Verbal cues for precautions/safety;Verbal cues for technique;Verbal cues for sequencing;Verbal cues for safe use of DME/AE;Manual facilitation for weight shifting  Sit to Stand Details (indicate cue  type and reason) cues to scoot to edge of mat, for use of hands to stand and for anterior weight shifting to assist with standing  Stand to Sit 4: Min guard;4: Min assist  Stand to Sit Details (indicate cue type and reason) Verbal cues for technique;Verbal  cues for precautions/safety;Verbal cues for sequencing;Manual facilitation for weight shifting;Tactile cues for posture  Stand to Sit Details cues to turn fully to surface before sitting and to reach back to assist with controlled descent  Ambulation/Gait  Ambulation/Gait Yes  Ambulation/Gait Assistance 4: Min guard;4: Min assist  Ambulation/Gait Assistance Details cues to stay on pathways due to veering right, used blue tiles on floor to assist with this by having pt "follow the tiles". assist for walker management needed along with cues to advance LE as he tends to leave it behind. cues for larger step length on left as well .  Ambulation Distance (Feet) 120 Feet  Assistive device Rolling walker;Other (Comment) (left hand orthotic with strap)  Gait Pattern Step-to pattern;Decreased arm swing - right;Decreased arm swing - left;Decreased step length - right;Decreased step length - left  Ambulation Surface Level;Indoor  Neuro Re-ed   Neuro Re-ed Details  for strengthening/muscle re-ed/balance: tall kneeling on mat table with UE support on kaye bench- worked on mini squats with emphasis on equal weight bearing on each side in midline positon, progressed to moving right LE forward/backwards for ~10 reps. Pt the sitting himself down on mat. After a rest break pt was assisted with min assist back into tall kneeling with Purnell Shoemaker bench support- had pt right LE out/in for ~10 reps with emphasis on tall posture, weight shifting and to lift right LE up with movement.; seated at edge of mat: with 2 inch box under right foot- sit<>stands for 10 reps with emphasis on tall posture and controlled descent wtih up to min assist needed with min assist, then with foam bubble under right foot:sit<>stand for 10 reps with up to min assist to stand tall and control descent with descent to sitting.        PT Short Term Goals - 01/04/21 1708      PT SHORT TERM GOAL #1   Title Patient will be independent with caregiver  assistnace with initial HEP for strengthening (All STGS Due: 02/01/21)    Baseline no HEP established    Time 4    Period Weeks    Status New    Target Date 02/01/21      PT SHORT TERM GOAL #2   Title Patient will demonstrate ability to ambulate > 150 ft with CGA with LRAD for improved household mobility    Baseline 60 ft intermittent CGA - Min A    Time 4    Period Weeks    Status New      PT SHORT TERM GOAL #3   Title Patient will be able to stand for >/= 1 minute with supervision to demonstrate improved balance    Baseline < 30 seconds without assistance    Time 4    Period Weeks    Status New             PT Long Term Goals - 01/04/21 1711      PT LONG TERM GOAL #1   Title Patient will be independent with caregiver assistance with Final HEP (All LTGs Due:    Baseline no HEP established    Time 8    Period Weeks    Status New  PT LONG TERM GOAL #2   Title Patient will demonstrate ability to complete all transfers with supervision to demonstrate improved independence and safety.    Baseline CGA to Min A    Time 8    Period Weeks    Status New      PT LONG TERM GOAL #3   Title Patient will be able to stand for >/= 4 minutes without assistance to demonstrate improved balance and tolerance for completion of ADL's    Baseline < 30 secs, CGA due to impulsivity    Time 8    Period Weeks    Status New      PT LONG TERM GOAL #4   Title Patient will demonstrate ability to ambulate > 250 ft with LRAD and close supervision for improved household mobility    Baseline 60 ft CGA to Min A    Time 8    Period Weeks    Status New      PT LONG TERM GOAL #5   Title Patient will improve 5 x sit <> stand to </= 13 seconds to demonstrate reduced fall risk and improved balance    Baseline 18.12    Time 8    Period Weeks    Status New              01/24/21 1322  Plan  Clinical Impression Statement Today's skilled session continued to focus on transfer/gait training  and activities to promote right LE movements/weight bearing with up to min assist with cues needed on technique and weight shifting. Pt continues to need cues to slow down for safety and improved muslce activation due to impulsivity. The pt is progressing toward goals and should benefit from continued PT to progress toward unmet goals.  Personal Factors and Comorbidities Comorbidity 3+;Behavior Pattern;Time since onset of injury/illness/exacerbation;Transportation;Age  Comorbidities GERD, Barrett esophagus, gastroparesis, HTN, Hyperlipidemia, CVA with L sided deficits, mental retardation, schizophrenia, hypothyroidism  Examination-Activity Limitations Bathing;Bed Mobility;Transfers;Dressing;Stairs;Stand  Examination-Participation Restrictions Community Activity;Medication Management  Pt will benefit from skilled therapeutic intervention in order to improve on the following deficits Abnormal gait;Decreased coordination;Decreased range of motion;Difficulty walking;Impaired tone;Decreased safety awareness;Decreased endurance;Decreased activity tolerance;Decreased knowledge of use of DME;Impaired perceived functional ability;Impaired vision/preception;Pain;Postural dysfunction;Impaired sensation;Decreased strength;Decreased mobility;Decreased cognition;Decreased balance  Stability/Clinical Decision Making Evolving/Moderate complexity  Rehab Potential Fair  PT Frequency 1x / week  PT Duration 8 weeks  PT Treatment/Interventions ADLs/Self Care Home Management;Cryotherapy;Moist Heat;DME Instruction;Gait training;Stair training;Functional mobility training;Therapeutic activities;Therapeutic exercise;Balance training;Neuromuscular re-education;Patient/family education;Orthotic Fit/Training;Manual techniques;Wheelchair mobility training;Passive range of motion  PT Next Visit Plan continue with gait training with hand orthotic with strap, transfer training with emphasis on safety, LE strengthening and balance  training.  Consulted and Agree with Plan of Care Patient;Family member/caregiver         Patient will benefit from skilled therapeutic intervention in order to improve the following deficits and impairments:  Abnormal gait,Decreased coordination,Decreased range of motion,Difficulty walking,Impaired tone,Decreased safety awareness,Decreased endurance,Decreased activity tolerance,Decreased knowledge of use of DME,Impaired perceived functional ability,Impaired vision/preception,Pain,Postural dysfunction,Impaired sensation,Decreased strength,Decreased mobility,Decreased cognition,Decreased balance  Visit Diagnosis: Muscle weakness (generalized)  Unsteadiness on feet  Difficulty in walking, not elsewhere classified  Other abnormalities of gait and mobility  Abnormal posture     Problem List Patient Active Problem List   Diagnosis Date Noted  . Dilation of esophagus   . Hyponatremia   . Dysphagia   . Seizures (HCC)   . S/P percutaneous endoscopic gastrostomy (PEG) tube placement (HCC)   . Debility 11/15/2020  .  DM II (diabetes mellitus, type II), controlled (HCC) 11/13/2020  . Bowel obstruction (HCC)   . S/P repair of paraesophageal hernia 10/26/2020  . Syncope 10/21/2020  . Hiatal hernia 10/21/2020  . Acute hypoxemic respiratory failure (HCC) 10/21/2020  . Sinus tachycardia 10/21/2020  . Gastric polyps   . Altered mental status 09/05/2020  . Emesis 07/24/2015  . Coffee ground emesis   . Gastrointestinal hemorrhage associated with other gastritis   . Acute esophagitis 01/09/2015  . Upper gastrointestinal bleed   . Diarrhea   . Nausea with vomiting   . GI bleed 01/08/2015  . GIB (gastrointestinal bleeding) 01/08/2015  . Nausea & vomiting 01/08/2015  . Reflux esophagitis 07/04/2009  . Loss of weight 07/04/2009  . BLOOD IN STOOL, OCCULT 07/04/2009  . ANEMIA, IRON DEFICIENCY 05/04/2008  . Hypothyroidism 04/05/2008  . Schizophrenia, unspecified type (HCC) 04/05/2008  .  Depression 04/05/2008  . HEMIPARESIS, LEFT 04/05/2008  . Essential hypertension 04/05/2008  . GERD 04/05/2008  . Gastroparesis 04/05/2008  . Seizure (HCC) 04/05/2008  . History of diverticulosis 08/21/2004  . Barrett's esophagus 04/01/2002  . HIATAL HERNIA 04/01/2002    Sallyanne Kuster, PTA, [facility] [address] [place], [place] [address] [phone] 01/25/21, 10:08 PM   Name: [name] MRN: [id number] Date of Birth: [birth year]

## 2021-01-31 ENCOUNTER — Encounter: Payer: Self-pay | Admitting: Physical Therapy

## 2021-01-31 ENCOUNTER — Ambulatory Visit: Payer: Medicare Other | Admitting: Occupational Therapy

## 2021-01-31 ENCOUNTER — Ambulatory Visit: Payer: Medicare Other | Admitting: Physical Therapy

## 2021-01-31 ENCOUNTER — Other Ambulatory Visit: Payer: Self-pay

## 2021-01-31 VITALS — BP 146/100 | HR 93

## 2021-01-31 DIAGNOSIS — M6281 Muscle weakness (generalized): Secondary | ICD-10-CM | POA: Diagnosis not present

## 2021-01-31 DIAGNOSIS — R414 Neurologic neglect syndrome: Secondary | ICD-10-CM

## 2021-01-31 DIAGNOSIS — R293 Abnormal posture: Secondary | ICD-10-CM

## 2021-01-31 DIAGNOSIS — R262 Difficulty in walking, not elsewhere classified: Secondary | ICD-10-CM

## 2021-01-31 DIAGNOSIS — I69354 Hemiplegia and hemiparesis following cerebral infarction affecting left non-dominant side: Secondary | ICD-10-CM

## 2021-01-31 DIAGNOSIS — R2681 Unsteadiness on feet: Secondary | ICD-10-CM

## 2021-01-31 DIAGNOSIS — R41840 Attention and concentration deficit: Secondary | ICD-10-CM

## 2021-01-31 DIAGNOSIS — R2689 Other abnormalities of gait and mobility: Secondary | ICD-10-CM

## 2021-01-31 NOTE — Patient Instructions (Signed)
  Use Lt hand to help:   Help bathe with Lt hand using loofah or bath mitt Open drawer and cabinets  Wipe table (may need to have Rt hand over Lt hand)  Turn on/off lightswitch Help pull up pants and shirt  Holding cup with both hands to bring to mouth Stacking/unstacking plastic cups (using Rt hand to hold bottom of one cup, and stacking another cup on top with Lt hand) Slide checkers along table  CAREGIVER ASSISTED: STRAIGHTEN Fingers    Place arm on table. Caregiver bends fingers into fist, THEN have patient open hand as much as possible with caregiver assisting to open further. Hold 10 seconds. 5 reps per set,  2 sets per day.   Extension (Passive)    Using other hand, lift hand at wrist as far as possible. Hold __5__ seconds. Repeat __5__ times. Do __2__ sessions per day.    Flexion: Eccentric ROM (Supine)    Position (A) Helper: Lift left arm to point to ceiling, palm turned inward. (Caregiver should hold at wrist and just above elbow) Motion (B) - Cue patient to lower arm slowly to side. -Helper supports arm throughout to control movement. Repeat _10__ times.  Do _2__ sessions per day. Can be done seated if sitting up tall

## 2021-01-31 NOTE — Therapy (Signed)
[name] [address] [place], [place], [address] Phone: [phone]   Fax:  [phone]  Occupational Therapy Treatment  Patient Details  Name: [name] MRN: [id number] Date of Birth: 04/05/1956 Referring Provider (OT): Dr.Swartz   Encounter Date: 01/31/2021   OT End of Session - 01/31/21 1503    Visit Number 4    Number of Visits 9    Authorization Type MCR primary, MCD secondary    OT Start Time 1235    OT Stop Time 1315    OT Time Calculation (min) 40 min    Activity Tolerance Treatment limited secondary to medical complications (Comment)    Behavior During Therapy Anxious           Past Medical History:  Diagnosis Date  . Barrett esophagus   . DM II (diabetes mellitus, type II), controlled ([place]) 11/13/2020  . Dysphagia   . Gastroparesis   . GERD (gastroesophageal reflux disease)   . Hypercholesteremia   . Hypertension   . Iron deficiency anemia   . Mental retardation   . Schizophrenia ([place])   . Seizure ([place])   . Stroke [facility])     Past Surgical History:  Procedure Laterality Date  . BIOPSY  10/21/2020   Procedure: BIOPSY;  Surgeon: Gatha Mayer, MD;  Location: [place];  Service: Endoscopy;;  . CRANIOTOMY FOR CYST FENESTRATION    . ESOPHAGOGASTRODUODENOSCOPY N/A 01/09/2015   Procedure: ESOPHAGOGASTRODUODENOSCOPY (EGD);  Surgeon: Inda Castle, MD;  Location: [place];  Service: Endoscopy;  Laterality: N/A;  . ESOPHAGOGASTRODUODENOSCOPY N/A 10/26/2020   Procedure: ESOPHAGOGASTRODUODENOSCOPY (EGD);  Surgeon: Lajuana Matte, MD;  Location: [facility] OR;  Service: Thoracic;  Laterality: N/A;  . ESOPHAGOGASTRODUODENOSCOPY (EGD) WITH PROPOFOL N/A 10/21/2020   Procedure: ESOPHAGOGASTRODUODENOSCOPY (EGD) WITH PROPOFOL;  Surgeon: Gatha Mayer, MD;  Location: [place];  Service: Endoscopy;  Laterality: N/A;  . IR GASTR TUBE CONVERT GASTR-JEJ PER W/FL MOD SED  11/07/2020  . PEG  PLACEMENT N/A 10/26/2020   Procedure: PERCUTANEOUS ENDOSCOPIC GASTROSTOMY (PEG) PLACEMENT;  Surgeon: Lajuana Matte, MD;  Location: [place];  Service: Thoracic;  Laterality: N/A;  . XI ROBOTIC ASSISTED HIATAL HERNIA REPAIR N/A 10/26/2020   Procedure: XI ROBOTIC ASSISTED HIATAL HERNIA REPAIR;  Surgeon: Lajuana Matte, MD;  Location: [place];  Service: Thoracic;  Laterality: N/A;    There were no vitals filed for this visit.   Subjective Assessment - 01/31/21 1242    Subjective  Caregiver reports dressing is better    Patient is accompanied by: --   caregiver from [name]   Pertinent History fall on 10/20/20 d/t ? syncope. PMH: T2DM, GERD, HTN, HLD, anemia, CVA w/ residual Lt hemiparesis (> 5 years ago per caregiver report), schizophrenia w/ intellectual delay    Limitations fall risk    Currently in Pain? No/denies           Pt practiced simple functional activities wiping table w/ assist from Rt hand, stacking cups using Lt hand w/ Rt hand to stabilize cup - pt required cues to perform. Pt unable to flip large cards over. Pt/caregiver given further simple tasks to use LUE for (assist w/ bathing, opening drawers/cabinets, etc) - see pt instructions for details  Pt/caregiver also issued caregiver assisted stretches for sh flexion, wrist and finger ext - see instructions for details.                      OT Education -  01/31/21 1313    Education Details caregiver assisted HEP, functional simple things to do with Lt hand/UE    Person(s) Educated Patient;Caregiver(s)    Methods Explanation;Demonstration;Handout;Verbal cues    Comprehension Verbalized understanding;Returned demonstration;Verbal cues required               OT Long Term Goals - 01/31/21 1504      OT LONG TERM GOAL #1   Title Pt/caregiver independent with simple HEP and functional activities to encourage simple assist from LUE and to increase awareness/attention to Lt side    Time 8     Period Weeks    Status On-going      OT LONG TERM GOAL #2   Title Pt to perform UE dressing w/ min assist and LE dressing with mod assist from caregiver    Time 8    Period Weeks    Status Achieved   met in clinic - therapist stressed importance of consistentcy  b/t caregivers for carryover     OT LONG TERM GOAL #3   Title Pt to stand and perform clothes management with no more than min assist    Time 8    Period Weeks    Status Achieved      OT LONG TERM GOAL #4   Title Pt to assist w/ bathing 50% including min assist from LUE using A/E prn    Time 8    Period Weeks    Status Achieved   per caregiver report     OT LONG TERM GOAL #5   Title Pt to perform toilet and shower transfers consistently with min assist or less and increased safety    Time 8    Period Weeks    Status Achieved   per caregiver report                Plan - 01/31/21 1504    Clinical Impression Statement Pt has met 4/5 LTG's at this time.    OT Occupational Profile and History Detailed Assessment- Review of Records and additional review of physical, cognitive, psychosocial history related to current functional performance    Occupational performance deficits (Please refer to evaluation for details): ADL's    Body Structure / Function / Physical Skills ADL;Dexterity;Tone;UE functional use;ROM;Mobility;Decreased knowledge of precautions;Proprioception;Body mechanics    Cognitive Skills Attention;Safety Awareness    Rehab Potential Good    Clinical Decision Making Several treatment options, min-mod task modification necessary    Comorbidities Affecting Occupational Performance: Presence of comorbidities impacting occupational performance    Comorbidities impacting occupational performance description: Lt hemiplegia and neglect from past stroke, seizure disorder, intellectual delay    Modification or Assistance to Complete Evaluation  Min-Moderate modification of tasks or assist with assess necessary to  complete eval    OT Frequency 1x / week    OT Duration 8 weeks    OT Treatment/Interventions Self-care/ADL training;DME and/or AE instruction;Splinting;Therapeutic activities;Coping strategies training;Therapeutic exercise;Neuromuscular education;Functional Mobility Training;Patient/family education;Passive range of motion;Visual/perceptual remediation/compensation;Manual Therapy    Plan review HEP and functional activities - anticipate d/c next session    Consulted and Agree with Plan of Care Patient           Patient will benefit from skilled therapeutic intervention in order to improve the following deficits and impairments:   Body Structure / Function / Physical Skills: ADL,Dexterity,Tone,UE functional use,ROM,Mobility,Decreased knowledge of precautions,Proprioception,Body mechanics Cognitive Skills: Attention,Safety Awareness     Visit Diagnosis: Hemiplegia and hemiparesis following cerebral infarction affecting left non-dominant side (  [place])  Neurological neglect syndrome  Attention and concentration deficit    Problem List Patient Active Problem List   Diagnosis Date Noted  . Dilation of esophagus   . Hyponatremia   . Dysphagia   . Seizures ([place])   . S/P percutaneous endoscopic gastrostomy (PEG) tube placement ([place])   . Debility 11/15/2020  . DM II (diabetes mellitus, type II), controlled ([place]) 11/13/2020  . Bowel obstruction ([place])   . S/P repair of paraesophageal hernia 10/26/2020  . Syncope 10/21/2020  . Hiatal hernia 10/21/2020  . Acute hypoxemic respiratory failure ([place]) 10/21/2020  . Sinus tachycardia 10/21/2020  . Gastric polyps   . Altered mental status 09/05/2020  . Emesis 07/24/2015  . Coffee ground emesis   . Gastrointestinal hemorrhage associated with other gastritis   . Acute esophagitis 01/09/2015  . Upper gastrointestinal bleed   . Diarrhea   . Nausea with vomiting   . GI bleed 01/08/2015  . GIB (gastrointestinal bleeding) 01/08/2015  . Nausea &  vomiting 01/08/2015  . Reflux esophagitis 07/04/2009  . Loss of weight 07/04/2009  . BLOOD IN STOOL, OCCULT 07/04/2009  . ANEMIA, IRON DEFICIENCY 05/04/2008  . Hypothyroidism 04/05/2008  . Schizophrenia, unspecified type ([place]) 04/05/2008  . Depression 04/05/2008  . HEMIPARESIS, LEFT 04/05/2008  . Essential hypertension 04/05/2008  . GERD 04/05/2008  . Gastroparesis 04/05/2008  . Seizure ([place]) 04/05/2008  . History of diverticulosis 08/21/2004  . Barrett's esophagus 04/01/2002  . HIATAL HERNIA 04/01/2002    Carey Bullocks, OTR/L 01/31/2021, 3:06 PM  [place] [address] [place], [place], [address] Phone: [phone]   Fax:  [phone]  Name: JONUEL BUTTERFIELD MRN: [id number] Date of Birth: 05-Oct-1956

## 2021-02-02 NOTE — Therapy (Signed)
[facility] [address] [place], [place], [address] Phone: [phone]   Fax:  [phone]  Physical Therapy Treatment  Patient Details  Name: [name] MRN: [id number] Date of Birth: 1956-01-25 Referring Provider (PT): Referred by: Lauraine Rinne PA-C. Followed by  Leeroy Cha, MD   Encounter Date: 01/31/2021   01/31/21 1320  PT Visits / Re-Eval  Visit Number 4  Number of Visits 9  Date for PT Re-Evaluation 04/04/21 (POC for 8 weeks, Cert for 90 days)  Authorization  Authorization Type Medicare/Medicaid Secondary (follow medicare guidelines)  Progress Note Due on Visit 10  PT Time Calculation  PT Start Time 8127  PT Stop Time 1400  PT Time Calculation (min) 43 min  PT - End of Session  Equipment Utilized During Treatment Gait belt  Activity Tolerance Patient tolerated treatment well  Behavior During Therapy Impulsive;WFL for tasks assessed/performed     Past Medical History:  Diagnosis Date  . Barrett esophagus   . DM II (diabetes mellitus, type II), controlled ([place]) 11/13/2020  . Dysphagia   . Gastroparesis   . GERD (gastroesophageal reflux disease)   . Hypercholesteremia   . Hypertension   . Iron deficiency anemia   . Mental retardation   . Schizophrenia ([place])   . Seizure ([place])   . Stroke [facility])     Past Surgical History:  Procedure Laterality Date  . BIOPSY  10/21/2020   Procedure: BIOPSY;  Surgeon: Gatha Mayer, MD;  Location: [facility];  Service: Endoscopy;;  . CRANIOTOMY FOR CYST FENESTRATION    . ESOPHAGOGASTRODUODENOSCOPY N/A 01/09/2015   Procedure: ESOPHAGOGASTRODUODENOSCOPY (EGD);  Surgeon: Inda Castle, MD;  Location: [facility];  Service: Endoscopy;  Laterality: N/A;  . ESOPHAGOGASTRODUODENOSCOPY N/A 10/26/2020   Procedure: ESOPHAGOGASTRODUODENOSCOPY (EGD);  Surgeon: Lajuana Matte, MD;  Location: [facility] OR;  Service: Thoracic;  Laterality: N/A;  .  ESOPHAGOGASTRODUODENOSCOPY (EGD) WITH PROPOFOL N/A 10/21/2020   Procedure: ESOPHAGOGASTRODUODENOSCOPY (EGD) WITH PROPOFOL;  Surgeon: Gatha Mayer, MD;  Location: [facility];  Service: Endoscopy;  Laterality: N/A;  . IR GASTR TUBE CONVERT GASTR-JEJ PER W/FL MOD SED  11/07/2020  . PEG PLACEMENT N/A 10/26/2020   Procedure: PERCUTANEOUS ENDOSCOPIC GASTROSTOMY (PEG) PLACEMENT;  Surgeon: Lajuana Matte, MD;  Location: [facility];  Service: Thoracic;  Laterality: N/A;  . XI ROBOTIC ASSISTED HIATAL HERNIA REPAIR N/A 10/26/2020   Procedure: XI ROBOTIC ASSISTED HIATAL HERNIA REPAIR;  Surgeon: Lajuana Matte, MD;  Location: [facility];  Service: Thoracic;  Laterality: N/A;    Vitals:   01/31/21 1327 01/31/21 1337 01/31/21 1341 01/31/21 1345  BP: (!) 148/96 (!) 163/99 (!) 156/104 (!) 146/100  Pulse: 96 (!) 107 93      01/31/21 1320  Symptoms/Limitations  Subjective No new complaints. No falls or pain to report. Reports HEP is going well.  Pertinent History GERD, Barrett esophagus, gastroparesis, HTN, Hyperlipidemia, CVA with L sided deficits, mental retardation, schizophrenia, hypothyroidism  Limitations Walking;Standing;House hold activities  Patient Stated Goals feel more confident in walking  Pain Assessment  Currently in Pain? No/denies      01/31/21 1326  Transfers  Transfers Sit to Stand;Stand to Sit  Sit to Stand 4: Min assist;With upper extremity assist;From bed;From chair/3-in-1  Stand to Sit 4: Min guard;With upper extremity assist;To bed;To chair/3-in-1  Ambulation/Gait  Ambulation/Gait Yes  Ambulation/Gait Assistance 4: Min guard;4: Min assist  Ambulation/Gait Assistance Details cues to stay centered in walker as pt tends to slide RW toward right which  causes his left LE to kick the walker with gait. assist needed to get left fingers straight in hand orthotic.  Ambulation Distance (Feet) 200 Feet (x1, plus around gym)  Assistive device Rolling walker;Other (Comment) (left  hand orthotic with strap)  Gait Pattern Step-to pattern;Decreased arm swing - right;Decreased arm swing - left;Decreased step length - right;Decreased step length - left;Step-through pattern  Ambulation Surface Level;Indoor  Therapeutic Activites   Therapeutic Activities Other Therapeutic Activities  Other Therapeutic Activities pt able to statically stand for 1 mintue with supervision.  Exercises  Exercises Other Exercises  Other Exercises  verbally reviewed current HEP with visual handouts with pt reporting no issues. Did not perform in session due to elevated BP after gait.         PT Short Term Goals - 01/31/21 1321      PT SHORT TERM GOAL #1   Title Patient will be independent with caregiver assistnace with initial HEP for strengthening (All STGS Due: 02/01/21)    Baseline no HEP established    Time 4    Period Weeks    Status New    Target Date 02/01/21      PT SHORT TERM GOAL #2   Title Patient will demonstrate ability to ambulate > 150 ft with CGA with LRAD for improved household mobility    Baseline 60 ft intermittent CGA - Min A    Time 4    Period Weeks    Status New      PT SHORT TERM GOAL #3   Title Patient will be able to stand for >/= 1 minute with supervision to demonstrate improved balance    Baseline < 30 seconds without assistance    Time 4    Period Weeks    Status New             PT Long Term Goals - 01/04/21 1711      PT LONG TERM GOAL #1   Title Patient will be independent with caregiver assistance with Final HEP (All LTGs Due:    Baseline no HEP established    Time 8    Period Weeks    Status New      PT LONG TERM GOAL #2   Title Patient will demonstrate ability to complete all transfers with supervision to demonstrate improved independence and safety.    Baseline CGA to Min A    Time 8    Period Weeks    Status New      PT LONG TERM GOAL #3   Title Patient will be able to stand for >/= 4 minutes without assistance to demonstrate  improved balance and tolerance for completion of ADL's    Baseline < 30 secs, CGA due to impulsivity    Time 8    Period Weeks    Status New      PT LONG TERM GOAL #4   Title Patient will demonstrate ability to ambulate > 250 ft with LRAD and close supervision for improved household mobility    Baseline 60 ft CGA to Min A    Time 8    Period Weeks    Status New      PT LONG TERM GOAL #5   Title Patient will improve 5 x sit <> stand to </= 13 seconds to demonstrate reduced fall risk and improved balance    Baseline 18.12    Time 8    Period Weeks    Status New  01/31/21 1320  Plan  Clinical Impression Statement Today's skilled session focused on progress toward STGs with 2/3 goals fully met and remaining STG partially met. Other activities in session limited due to elevated BP after gait that did decrease from diastolic of 916 to 945 with rest breaks. Caregiver aware and to continue to monitor rest of day. The pt is progressing and should benefit from continued PT to progress toward LTGs.  Personal Factors and Comorbidities Comorbidity 3+;Behavior Pattern;Time since onset of injury/illness/exacerbation;Transportation;Age  Comorbidities GERD, Barrett esophagus, gastroparesis, HTN, Hyperlipidemia, CVA with L sided deficits, mental retardation, schizophrenia, hypothyroidism  Examination-Activity Limitations Bathing;Bed Mobility;Transfers;Dressing;Stairs;Stand  Examination-Participation Restrictions Community Activity;Medication Management  Pt will benefit from skilled therapeutic intervention in order to improve on the following deficits Abnormal gait;Decreased coordination;Decreased range of motion;Difficulty walking;Impaired tone;Decreased safety awareness;Decreased endurance;Decreased activity tolerance;Decreased knowledge of use of DME;Impaired perceived functional ability;Impaired vision/preception;Pain;Postural dysfunction;Impaired sensation;Decreased strength;Decreased  mobility;Decreased cognition;Decreased balance  Stability/Clinical Decision Making Evolving/Moderate complexity  Rehab Potential Fair  PT Frequency 1x / week  PT Duration 8 weeks  PT Treatment/Interventions ADLs/Self Care Home Management;Cryotherapy;Moist Heat;DME Instruction;Gait training;Stair training;Functional mobility training;Therapeutic activities;Therapeutic exercise;Balance training;Neuromuscular re-education;Patient/family education;Orthotic Fit/Training;Manual techniques;Wheelchair mobility training;Passive range of motion  PT Next Visit Plan continue with gait training with hand orthotic with strap, transfer training with emphasis on safety, LE strengthening and balance training. Monitor BP with session.  PT Home Exercise Plan Access Code: [email or web address]  Consulted and Agree with Plan of Care Patient;Family member/caregiver          Patient will benefit from skilled therapeutic intervention in order to improve the following deficits and impairments:  Abnormal gait,Decreased coordination,Decreased range of motion,Difficulty walking,Impaired tone,Decreased safety awareness,Decreased endurance,Decreased activity tolerance,Decreased knowledge of use of DME,Impaired perceived functional ability,Impaired vision/preception,Pain,Postural dysfunction,Impaired sensation,Decreased strength,Decreased mobility,Decreased cognition,Decreased balance  Visit Diagnosis: Unsteadiness on feet  Muscle weakness (generalized)  Difficulty in walking, not elsewhere classified  Other abnormalities of gait and mobility  Abnormal posture     Problem List Patient Active Problem List   Diagnosis Date Noted  . Dilation of esophagus   . Hyponatremia   . Dysphagia   . Seizures ([place])   . S/P percutaneous endoscopic gastrostomy (PEG) tube placement ([place])   . Debility 11/15/2020  . DM II (diabetes mellitus, type II), controlled ([place]) 11/13/2020  . Bowel obstruction ([place])   . S/P repair of  paraesophageal hernia 10/26/2020  . Syncope 10/21/2020  . Hiatal hernia 10/21/2020  . Acute hypoxemic respiratory failure ([place]) 10/21/2020  . Sinus tachycardia 10/21/2020  . Gastric polyps   . Altered mental status 09/05/2020  . Emesis 07/24/2015  . Coffee ground emesis   . Gastrointestinal hemorrhage associated with other gastritis   . Acute esophagitis 01/09/2015  . Upper gastrointestinal bleed   . Diarrhea   . Nausea with vomiting   . GI bleed 01/08/2015  . GIB (gastrointestinal bleeding) 01/08/2015  . Nausea & vomiting 01/08/2015  . Reflux esophagitis 07/04/2009  . Loss of weight 07/04/2009  . BLOOD IN STOOL, OCCULT 07/04/2009  . ANEMIA, IRON DEFICIENCY 05/04/2008  . Hypothyroidism 04/05/2008  . Schizophrenia, unspecified type ([place]) 04/05/2008  . Depression 04/05/2008  . HEMIPARESIS, LEFT 04/05/2008  . Essential hypertension 04/05/2008  . GERD 04/05/2008  . Gastroparesis 04/05/2008  . Seizure ([place]) 04/05/2008  . History of diverticulosis 08/21/2004  . Barrett's esophagus 04/01/2002  . HIATAL HERNIA 04/01/2002    Willow Ora, PTA, [facility] [address], [place], [place] [address] [phone] 02/02/21, 11:38 AM  Name: [name] MRN: [id number] Date of Birth: Jul 02, 1956

## 2021-02-08 ENCOUNTER — Ambulatory Visit: Payer: Medicare Other

## 2021-02-08 ENCOUNTER — Ambulatory Visit: Payer: Medicare Other | Admitting: Occupational Therapy

## 2021-02-14 ENCOUNTER — Ambulatory Visit: Payer: Medicare Other

## 2021-02-14 ENCOUNTER — Ambulatory Visit: Payer: Medicare Other | Admitting: Occupational Therapy

## 2021-02-14 ENCOUNTER — Other Ambulatory Visit: Payer: Self-pay

## 2021-02-14 VITALS — BP 118/64 | HR 71

## 2021-02-14 DIAGNOSIS — R2681 Unsteadiness on feet: Secondary | ICD-10-CM

## 2021-02-14 DIAGNOSIS — R262 Difficulty in walking, not elsewhere classified: Secondary | ICD-10-CM

## 2021-02-14 DIAGNOSIS — M6281 Muscle weakness (generalized): Secondary | ICD-10-CM

## 2021-02-14 DIAGNOSIS — R41840 Attention and concentration deficit: Secondary | ICD-10-CM

## 2021-02-14 DIAGNOSIS — I69354 Hemiplegia and hemiparesis following cerebral infarction affecting left non-dominant side: Secondary | ICD-10-CM

## 2021-02-14 DIAGNOSIS — R2689 Other abnormalities of gait and mobility: Secondary | ICD-10-CM

## 2021-02-14 DIAGNOSIS — R414 Neurologic neglect syndrome: Secondary | ICD-10-CM

## 2021-02-14 NOTE — Therapy (Signed)
[facility] [address] [place], [place], [address] Phone: [phone]   Fax:  [phone]  Occupational Therapy Treatment  Patient Details  Name: [name] MRN: [id number] Date of Birth: 11-Jan-1956 Referring Provider (OT): Dr.Swartz   Encounter Date: 02/14/2021   OT End of Session - 02/14/21 1302    Visit Number 5    Number of Visits 9    Authorization Type MCR primary, MCD secondary    OT Start Time 63    OT Stop Time 1258    OT Time Calculation (min) 28 min    Activity Tolerance Treatment limited secondary to medical complications (Comment)    Behavior During Therapy Anxious           Past Medical History:  Diagnosis Date  . Barrett esophagus   . DM II (diabetes mellitus, type II), controlled ([place]) 11/13/2020  . Dysphagia   . Gastroparesis   . GERD (gastroesophageal reflux disease)   . Hypercholesteremia   . Hypertension   . Iron deficiency anemia   . Mental retardation   . Schizophrenia ([place])   . Seizure ([place])   . Stroke [facility])     Past Surgical History:  Procedure Laterality Date  . BIOPSY  10/21/2020   Procedure: BIOPSY;  Surgeon: Gatha Mayer, MD;  Location: [facility];  Service: Endoscopy;;  . CRANIOTOMY FOR CYST FENESTRATION    . ESOPHAGOGASTRODUODENOSCOPY N/A 01/09/2015   Procedure: ESOPHAGOGASTRODUODENOSCOPY (EGD);  Surgeon: Inda Castle, MD;  Location: [facility];  Service: Endoscopy;  Laterality: N/A;  . ESOPHAGOGASTRODUODENOSCOPY N/A 10/26/2020   Procedure: ESOPHAGOGASTRODUODENOSCOPY (EGD);  Surgeon: Lajuana Matte, MD;  Location: [facility] OR;  Service: Thoracic;  Laterality: N/A;  . ESOPHAGOGASTRODUODENOSCOPY (EGD) WITH PROPOFOL N/A 10/21/2020   Procedure: ESOPHAGOGASTRODUODENOSCOPY (EGD) WITH PROPOFOL;  Surgeon: Gatha Mayer, MD;  Location: [facility];  Service: Endoscopy;  Laterality: N/A;  . IR GASTR TUBE CONVERT GASTR-JEJ PER W/FL MOD SED  11/07/2020  .  PEG PLACEMENT N/A 10/26/2020   Procedure: PERCUTANEOUS ENDOSCOPIC GASTROSTOMY (PEG) PLACEMENT;  Surgeon: Lajuana Matte, MD;  Location: [facility];  Service: Thoracic;  Laterality: N/A;  . XI ROBOTIC ASSISTED HIATAL HERNIA REPAIR N/A 10/26/2020   Procedure: XI ROBOTIC ASSISTED HIATAL HERNIA REPAIR;  Surgeon: Lajuana Matte, MD;  Location: [facility];  Service: Thoracic;  Laterality: N/A;    There were no vitals filed for this visit.   Subjective Assessment - 02/14/21 1232    Subjective  Caregiver reports dressing is better    Patient is accompanied by: --   caregiver from [place]   Pertinent History fall on 10/20/20 d/t ? syncope. PMH: T2DM, GERD, HTN, HLD, anemia, CVA w/ residual Lt hemiparesis (> 5 years ago per caregiver report), schizophrenia w/ intellectual delay    Limitations fall risk    Currently in Pain? No/denies           Reviewed previously issued HEP and added a couple of tasks/adapted prn. Pt practiced sliding checkers into cup LUE, stacking cups w/ Rt hand as stablizer and mod cues. Reviewed stretches for LUE.                           OT Long Term Goals - 02/14/21 1302      OT LONG TERM GOAL #1   Title Pt/caregiver independent with simple HEP and functional activities to encourage simple assist from LUE and to increase awareness/attention to Lt side  Time 8    Period Weeks    Status Achieved      OT LONG TERM GOAL #2   Title Pt to perform UE dressing w/ min assist and LE dressing with mod assist from caregiver    Time 8    Period Weeks    Status Achieved   met in clinic - therapist stressed importance of consistentcy  b/t caregivers for carryover     OT LONG TERM GOAL #3   Title Pt to stand and perform clothes management with no more than min assist    Time 8    Period Weeks    Status Achieved      OT LONG TERM GOAL #4   Title Pt to assist w/ bathing 50% including min assist from LUE using A/E prn    Time 8    Period Weeks     Status Achieved   per caregiver report     OT LONG TERM GOAL #5   Title Pt to perform toilet and shower transfers consistently with min assist or less and increased safety    Time 8    Period Weeks    Status Achieved   per caregiver report                Plan - 02/14/21 1303    Clinical Impression Statement Pt has met all LTG's at this time. Pt has reached max rehab potential for O.T. at this time.    OT Occupational Profile and History Detailed Assessment- Review of Records and additional review of physical, cognitive, psychosocial history related to current functional performance    Occupational performance deficits (Please refer to evaluation for details): ADL's    Body Structure / Function / Physical Skills ADL;Dexterity;Tone;UE functional use;ROM;Mobility;Decreased knowledge of precautions;Proprioception;Body mechanics    Cognitive Skills Attention;Safety Awareness    Rehab Potential Good    Clinical Decision Making Several treatment options, min-mod task modification necessary    Comorbidities Affecting Occupational Performance: Presence of comorbidities impacting occupational performance    Comorbidities impacting occupational performance description: Lt hemiplegia and neglect from past stroke, seizure disorder, intellectual delay    Modification or Assistance to Complete Evaluation  Min-Moderate modification of tasks or assist with assess necessary to complete eval    OT Frequency 1x / week    OT Duration 8 weeks    OT Treatment/Interventions Self-care/ADL training;DME and/or AE instruction;Splinting;Therapeutic activities;Coping strategies training;Therapeutic exercise;Neuromuscular education;Functional Mobility Training;Patient/family education;Passive range of motion;Visual/perceptual remediation/compensation;Manual Therapy    Plan D/C O.T.    Consulted and Agree with Plan of Care Patient           Patient will benefit from skilled therapeutic intervention in order to  improve the following deficits and impairments:   Body Structure / Function / Physical Skills: ADL,Dexterity,Tone,UE functional use,ROM,Mobility,Decreased knowledge of precautions,Proprioception,Body mechanics Cognitive Skills: Attention,Safety Awareness     Visit Diagnosis: Hemiplegia and hemiparesis following cerebral infarction affecting left non-dominant side [facility])  Neurological neglect syndrome  Attention and concentration deficit    Problem List Patient Active Problem List   Diagnosis Date Noted  . Dilation of esophagus   . Hyponatremia   . Dysphagia   . Seizures ([place])   . S/P percutaneous endoscopic gastrostomy (PEG) tube placement ([place])   . Debility 11/15/2020  . DM II (diabetes mellitus, type II), controlled ([place]) 11/13/2020  . Bowel obstruction ([place])   . S/P repair of paraesophageal hernia 10/26/2020  . Syncope 10/21/2020  . Hiatal hernia 10/21/2020  .  Acute hypoxemic respiratory failure ([place]) 10/21/2020  . Sinus tachycardia 10/21/2020  . Gastric polyps   . Altered mental status 09/05/2020  . Emesis 07/24/2015  . Coffee ground emesis   . Gastrointestinal hemorrhage associated with other gastritis   . Acute esophagitis 01/09/2015  . Upper gastrointestinal bleed   . Diarrhea   . Nausea with vomiting   . GI bleed 01/08/2015  . GIB (gastrointestinal bleeding) 01/08/2015  . Nausea & vomiting 01/08/2015  . Reflux esophagitis 07/04/2009  . Loss of weight 07/04/2009  . BLOOD IN STOOL, OCCULT 07/04/2009  . ANEMIA, IRON DEFICIENCY 05/04/2008  . Hypothyroidism 04/05/2008  . Schizophrenia, unspecified type ([place]) 04/05/2008  . Depression 04/05/2008  . HEMIPARESIS, LEFT 04/05/2008  . Essential hypertension 04/05/2008  . GERD 04/05/2008  . Gastroparesis 04/05/2008  . Seizure ([place]) 04/05/2008  . History of diverticulosis 08/21/2004  . Barrett's esophagus 04/01/2002  . HIATAL HERNIA 04/01/2002     OCCUPATIONAL THERAPY DISCHARGE SUMMARY  Visits from Start of  Care: 5  Current functional level related to goals / functional outcomes: See above    Remaining deficits: Lt hemiplegia Lt neglect/inattention Immunologist    Education / Equipment: HEP, hemi dressing techniques, A/E recommendations  Plan: Patient agrees to discharge.  Patient goals were met. Patient is being discharged due to meeting the stated rehab goals.  ?????        Carey Bullocks, OTR/L 02/14/2021, 1:04 PM  [facility] [address] [place], [place], [address] Phone: [phone]   Fax:  [phone]  Name: [name] MRN: [id number] Date of Birth: [birth year]

## 2021-02-14 NOTE — Therapy (Signed)
[facility] [address] [place], [place], [address] Phone: [phone]   Fax:  [phone]  Physical Therapy Treatment  Patient Details  Name: [name] MRN: [id number] Date of Birth: 1956-09-03 Referring Provider (PT): Referred by: Lauraine Rinne PA-C. Followed by  Leeroy Cha, MD   Encounter Date: 02/14/2021   PT End of Session - 02/14/21 1255    Visit Number 5    Number of Visits 9    Date for PT Re-Evaluation 04/04/21   POC for 8 weeks, Cert for 90 days   Authorization Type Medicare/Medicaid Secondary (follow medicare guidelines)    Progress Note Due on Visit 10    PT Start Time 6962   patient using bathroom prior to session   PT Stop Time 1230    PT Time Calculation (min) 39 min    Equipment Utilized During Treatment Gait belt    Activity Tolerance Patient tolerated treatment well    Behavior During Therapy Impulsive;WFL for tasks assessed/performed           Past Medical History:  Diagnosis Date  . Barrett esophagus   . DM II (diabetes mellitus, type II), controlled ([place]) 11/13/2020  . Dysphagia   . Gastroparesis   . GERD (gastroesophageal reflux disease)   . Hypercholesteremia   . Hypertension   . Iron deficiency anemia   . Mental retardation   . Schizophrenia ([place])   . Seizure ([place])   . Stroke [facility])     Past Surgical History:  Procedure Laterality Date  . BIOPSY  10/21/2020   Procedure: BIOPSY;  Surgeon: Gatha Mayer, MD;  Location: [facility];  Service: Endoscopy;;  . CRANIOTOMY FOR CYST FENESTRATION    . ESOPHAGOGASTRODUODENOSCOPY N/A 01/09/2015   Procedure: ESOPHAGOGASTRODUODENOSCOPY (EGD);  Surgeon: Inda Castle, MD;  Location: [facility];  Service: Endoscopy;  Laterality: N/A;  . ESOPHAGOGASTRODUODENOSCOPY N/A 10/26/2020   Procedure: ESOPHAGOGASTRODUODENOSCOPY (EGD);  Surgeon: Lajuana Matte, MD;  Location: [facility] OR;  Service: Thoracic;  Laterality: N/A;  .  ESOPHAGOGASTRODUODENOSCOPY (EGD) WITH PROPOFOL N/A 10/21/2020   Procedure: ESOPHAGOGASTRODUODENOSCOPY (EGD) WITH PROPOFOL;  Surgeon: Gatha Mayer, MD;  Location: [facility];  Service: Endoscopy;  Laterality: N/A;  . IR GASTR TUBE CONVERT GASTR-JEJ PER W/FL MOD SED  11/07/2020  . PEG PLACEMENT N/A 10/26/2020   Procedure: PERCUTANEOUS ENDOSCOPIC GASTROSTOMY (PEG) PLACEMENT;  Surgeon: Lajuana Matte, MD;  Location: [facility];  Service: Thoracic;  Laterality: N/A;  . XI ROBOTIC ASSISTED HIATAL HERNIA REPAIR N/A 10/26/2020   Procedure: XI ROBOTIC ASSISTED HIATAL HERNIA REPAIR;  Surgeon: Lajuana Matte, MD;  Location: [facility];  Service: Thoracic;  Laterality: N/A;    Vitals:   02/14/21 1155  BP: 118/64  Pulse: 71     Subjective Assessment - 02/14/21 1153    Subjective No new changes/complaints. No falls. reports completing exercises every evening.    Pertinent History GERD, Barrett esophagus, gastroparesis, HTN, Hyperlipidemia, CVA with L sided deficits, mental retardation, schizophrenia, hypothyroidism    Limitations Walking;Standing;House hold activities    Patient Stated Goals feel more confident in walking    Currently in Pain? No/denies                [facility] Adult PT Treatment/Exercise - 02/14/21 0001      Transfers   Transfers Sit to Stand;Stand to Sit    Sit to Stand 4: Min assist;With upper extremity assist;From bed;From chair/3-in-1    Sit to Stand Details Tactile cues for weight  shifting;Tactile cues for posture;Verbal cues for precautions/safety;Verbal cues for technique;Verbal cues for sequencing;Verbal cues for safe use of DME/AE;Manual facilitation for weight shifting    Stand to Sit 4: Min guard;With upper extremity assist;To bed;To chair/3-in-1    Comments intermittent min A for balance upn standing. Completed sit <> stand x 5 reps. verbal cues required for proper completion and slowed control/pace.      Ambulation/Gait   Ambulation/Gait Yes     Ambulation/Gait Assistance 4: Min guard;4: Min assist    Ambulation/Gait Assistance Details continued to provide verbal cues to stay within RW, PT placing LUE into hand orthotic with strap as not utilzizing appropriately    Ambulation Distance (Feet) 60 Feet   x 2   Assistive device Rolling walker;Other (Comment)   L Hand Orthotic on walker   Gait Pattern Step-to pattern;Decreased arm swing - right;Decreased arm swing - left;Decreased step length - right;Decreased step length - left;Step-through pattern    Ambulation Surface Level;Indoor      Neuro Re-ed    Neuro Re-ed Details  Completed seated balance on green air disc with intermittent UE support x 1 minute. Then progressed to completeing alternating marching x 10 reps, and LAQ x 10 reps with intermittent touch down support from UE. Patient did require increased verbal cues for proper completion. Completed standing balance without UE support completed reaching for colored pegs and placing on peg board completed x 4 minutes, intermittent cues required for posture. Completed standing without UE support working toward stepping RLE forward to colored disc to promote weight shift to LLE, completed 2 x 10 reps. intermittnet rest break required due to fatigue. BP staying WNL throughout session with activities.      Exercises   Exercises Knee/Hip      Knee/Hip Exercises: Supine   Hip Adduction Isometric Strengthening;Both;1 set;10 reps;Limitations    Hip Adduction Isometric Limitations with 3 second hold, completed with isometric hip adduction ball squeeze in hooklying position, completed x 10 reps.    Bridges Strengthening;Both;1 set;10 reps;Limitations    Bridges Limitations with 3 second hold, verbal cues for proper completion. completed x 10 reps.               PT Short Term Goals - 01/31/21 1321      PT SHORT TERM GOAL #1   Title Patient will be independent with caregiver assistnace with initial HEP for strengthening (All STGS Due:  02/01/21)    Baseline 01/31/21: pt and caregiver with pt report compliance with current program    Time --    Period --    Status Achieved    Target Date --      PT SHORT TERM GOAL #2   Title Patient will demonstrate ability to ambulate > 150 ft with CGA with LRAD for improved household mobility    Baseline 01/31/21: 200 feet with RW with min guard to min assist for safety/balance    Time --    Period --    Status Partially Met      PT SHORT TERM GOAL #3   Title Patient will be able to stand for >/= 1 minute with supervision to demonstrate improved balance    Baseline 01/31/21: met in session today    Time --    Period --    Status Achieved             PT Long Term Goals - 01/04/21 1711      PT LONG TERM GOAL #1   Title Patient  will be independent with caregiver assistance with Final HEP (All LTGs Due:    Baseline no HEP established    Time 8    Period Weeks    Status New      PT LONG TERM GOAL #2   Title Patient will demonstrate ability to complete all transfers with supervision to demonstrate improved independence and safety.    Baseline CGA to Min A    Time 8    Period Weeks    Status New      PT LONG TERM GOAL #3   Title Patient will be able to stand for >/= 4 minutes without assistance to demonstrate improved balance and tolerance for completion of ADL's    Baseline < 30 secs, CGA due to impulsivity    Time 8    Period Weeks    Status New      PT LONG TERM GOAL #4   Title Patient will demonstrate ability to ambulate > 250 ft with LRAD and close supervision for improved household mobility    Baseline 60 ft CGA to Min A    Time 8    Period Weeks    Status New      PT LONG TERM GOAL #5   Title Patient will improve 5 x sit <> stand to </= 13 seconds to demonstrate reduced fall risk and improved balance    Baseline 18.12    Time 8    Period Weeks    Status New                 Plan - 02/14/21 1256    Clinical Impression Statement Today's skilled session  focused on continued seated/standing balance working toward reduced UE support, improved posture, and activity tolerance. Patient continue to require intermittent rest breaks with activities. BP staying WNL today during session. Will continue per POC and progress toward all LTGs.    Personal Factors and Comorbidities Comorbidity 3+;Behavior Pattern;Time since onset of injury/illness/exacerbation;Transportation;Age    Comorbidities GERD, Barrett esophagus, gastroparesis, HTN, Hyperlipidemia, CVA with L sided deficits, mental retardation, schizophrenia, hypothyroidism    Examination-Activity Limitations Bathing;Bed Mobility;Transfers;Dressing;Stairs;Stand    Examination-Participation Restrictions Community Activity;Medication Management    Stability/Clinical Decision Making Evolving/Moderate complexity    Rehab Potential Fair    PT Frequency 1x / week    PT Duration 8 weeks    PT Treatment/Interventions ADLs/Self Care Home Management;Cryotherapy;Moist Heat;DME Instruction;Gait training;Stair training;Functional mobility training;Therapeutic activities;Therapeutic exercise;Balance training;Neuromuscular re-education;Patient/family education;Orthotic Fit/Training;Manual techniques;Wheelchair mobility training;Passive range of motion    PT Next Visit Plan continue with gait training with hand orthotic with strap, transfer training with emphasis on safety, LE strengthening and balance training. Monitor BP with session.    PT Home Exercise Plan Access Code: [email or web address]    Consulted and Agree with Plan of Care Patient;Family member/caregiver           Patient will benefit from skilled therapeutic intervention in order to improve the following deficits and impairments:  Abnormal gait,Decreased coordination,Decreased range of motion,Difficulty walking,Impaired tone,Decreased safety awareness,Decreased endurance,Decreased activity tolerance,Decreased knowledge of use of DME,Impaired perceived functional  ability,Impaired vision/preception,Pain,Postural dysfunction,Impaired sensation,Decreased strength,Decreased mobility,Decreased cognition,Decreased balance  Visit Diagnosis: Muscle weakness (generalized)  Difficulty in walking, not elsewhere classified  Other abnormalities of gait and mobility  Unsteadiness on feet     Problem List Patient Active Problem List   Diagnosis Date Noted  . Dilation of esophagus   . Hyponatremia   . Dysphagia   . Seizures ([place])   . S/P percutaneous  endoscopic gastrostomy (PEG) tube placement ([place])   . Debility 11/15/2020  . DM II (diabetes mellitus, type II), controlled ([place]) 11/13/2020  . Bowel obstruction ([place])   . S/P repair of paraesophageal hernia 10/26/2020  . Syncope 10/21/2020  . Hiatal hernia 10/21/2020  . Acute hypoxemic respiratory failure ([place]) 10/21/2020  . Sinus tachycardia 10/21/2020  . Gastric polyps   . Altered mental status 09/05/2020  . Emesis 07/24/2015  . Coffee ground emesis   . Gastrointestinal hemorrhage associated with other gastritis   . Acute esophagitis 01/09/2015  . Upper gastrointestinal bleed   . Diarrhea   . Nausea with vomiting   . GI bleed 01/08/2015  . GIB (gastrointestinal bleeding) 01/08/2015  . Nausea & vomiting 01/08/2015  . Reflux esophagitis 07/04/2009  . Loss of weight 07/04/2009  . BLOOD IN STOOL, OCCULT 07/04/2009  . ANEMIA, IRON DEFICIENCY 05/04/2008  . Hypothyroidism 04/05/2008  . Schizophrenia, unspecified type ([place]) 04/05/2008  . Depression 04/05/2008  . HEMIPARESIS, LEFT 04/05/2008  . Essential hypertension 04/05/2008  . GERD 04/05/2008  . Gastroparesis 04/05/2008  . Seizure ([place]) 04/05/2008  . History of diverticulosis 08/21/2004  . Barrett's esophagus 04/01/2002  . HIATAL HERNIA 04/01/2002    Jones Bales, PT, DPT 02/14/2021, 1:01 PM  [facility] [address] [place], [place], [address] Phone: [phone]   Fax:   [phone]  Name: [name] MRN: [id number] Date of Birth: [birth year]

## 2021-02-15 ENCOUNTER — Ambulatory Visit: Payer: Medicare Other | Admitting: Neurology

## 2021-02-17 ENCOUNTER — Inpatient Hospital Stay: Payer: Medicare Other | Admitting: Physical Medicine and Rehabilitation

## 2021-02-21 ENCOUNTER — Other Ambulatory Visit: Payer: Self-pay

## 2021-02-21 ENCOUNTER — Ambulatory Visit: Payer: Medicare Other | Admitting: Occupational Therapy

## 2021-02-21 ENCOUNTER — Ambulatory Visit: Payer: Medicare Other | Attending: Physician Assistant

## 2021-02-21 VITALS — BP 128/76 | HR 73

## 2021-02-21 DIAGNOSIS — R2681 Unsteadiness on feet: Secondary | ICD-10-CM | POA: Insufficient documentation

## 2021-02-21 DIAGNOSIS — R262 Difficulty in walking, not elsewhere classified: Secondary | ICD-10-CM | POA: Insufficient documentation

## 2021-02-21 DIAGNOSIS — M6281 Muscle weakness (generalized): Secondary | ICD-10-CM | POA: Diagnosis present

## 2021-02-21 NOTE — Patient Instructions (Addendum)
Access Code: [email or web address] URL: [email or web address] Date: 02/21/2021 Prepared by: Jethro Bastos  Exercises Supine Bridge - 1 x daily - 5 x weekly - 2 sets - 10 reps Bent Knee Fallouts - 1 x daily - 5 x weekly - 2 sets - 10 reps Supine March with Resistance Band - 1 x daily - 5 x weekly - 2 sets - 10 reps - updated to red theraband Sit to Stand with Armchair - 1 x daily - 5 x weekly - 2 sets - 10 reps

## 2021-02-21 NOTE — Therapy (Signed)
[facility] [address] [place], [place], [address] Phone: [phone]   Fax:  [phone]  Physical Therapy Treatment/Discharge Summary  Patient Details  Name: [name] MRN: [id number] Date of Birth: 23-Feb-1956 Referring Provider (PT): Referred by: Lauraine Rinne PA-C. Followed by  Leeroy Cha, MD  PHYSICAL THERAPY DISCHARGE SUMMARY  Visits from Start of Care: 6  Current functional level related to goals / functional outcomes: See Clinical Impression Statement   Remaining deficits: Impulsivity; Decreased Awareness   Education / Equipment: HEP Provided  Plan: Patient agrees to discharge.  Patient goals were partially met. Patient is being discharged due to meeting the stated rehab goals.  ?????       Encounter Date: 02/21/2021   PT End of Session - 02/21/21 1228    Visit Number 6    Number of Visits 9    Date for PT Re-Evaluation 04/04/21   POC for 8 weeks, Cert for 90 days   Authorization Type Medicare/Medicaid Secondary (follow medicare guidelines)    Progress Note Due on Visit 10    PT Start Time 8466   patient using bathroom prior to start of session   PT Stop Time 1313    PT Time Calculation (min) 38 min    Equipment Utilized During Treatment Gait belt    Activity Tolerance Patient tolerated treatment well    Behavior During Therapy Impulsive;WFL for tasks assessed/performed           Past Medical History:  Diagnosis Date  . Barrett esophagus   . DM II (diabetes mellitus, type II), controlled ([place]) 11/13/2020  . Dysphagia   . Gastroparesis   . GERD (gastroesophageal reflux disease)   . Hypercholesteremia   . Hypertension   . Iron deficiency anemia   . Mental retardation   . Schizophrenia ([place])   . Seizure ([place])   . Stroke [facility])     Past Surgical History:  Procedure Laterality Date  . BIOPSY  10/21/2020   Procedure: BIOPSY;  Surgeon: Gatha Mayer, MD;  Location: [facility];  Service: Endoscopy;;  . CRANIOTOMY FOR CYST FENESTRATION    . ESOPHAGOGASTRODUODENOSCOPY N/A 01/09/2015   Procedure: ESOPHAGOGASTRODUODENOSCOPY (EGD);  Surgeon: Inda Castle, MD;  Location: [facility];  Service: Endoscopy;  Laterality: N/A;  . ESOPHAGOGASTRODUODENOSCOPY N/A 10/26/2020   Procedure: ESOPHAGOGASTRODUODENOSCOPY (EGD);  Surgeon: Lajuana Matte, MD;  Location: [facility] OR;  Service: Thoracic;  Laterality: N/A;  . ESOPHAGOGASTRODUODENOSCOPY (EGD) WITH PROPOFOL N/A 10/21/2020   Procedure: ESOPHAGOGASTRODUODENOSCOPY (EGD) WITH PROPOFOL;  Surgeon: Gatha Mayer, MD;  Location: [facility];  Service: Endoscopy;  Laterality: N/A;  . IR GASTR TUBE CONVERT GASTR-JEJ PER W/FL MOD SED  11/07/2020  . PEG PLACEMENT N/A 10/26/2020   Procedure: PERCUTANEOUS ENDOSCOPIC GASTROSTOMY (PEG) PLACEMENT;  Surgeon: Lajuana Matte, MD;  Location: [facility];  Service: Thoracic;  Laterality: N/A;  . XI ROBOTIC ASSISTED HIATAL HERNIA REPAIR N/A 10/26/2020   Procedure: XI ROBOTIC ASSISTED HIATAL HERNIA REPAIR;  Surgeon: Lajuana Matte, MD;  Location: [facility];  Service: Thoracic;  Laterality: N/A;    Vitals:   02/21/21 1238  BP: 128/76  Pulse: 73     Subjective Assessment - 02/21/21 1232    Subjective No new change/complaints. No falls.    Pertinent History GERD, Barrett esophagus, gastroparesis, HTN, Hyperlipidemia, CVA with L sided deficits, mental retardation, schizophrenia, hypothyroidism    Limitations Walking;Standing;House hold activities    Patient Stated Goals feel more confident in walking  Currently in Pain? No/denies                 [facility] Adult PT Treatment/Exercise - 02/21/21 0001      Bed Mobility   Bed Mobility Supine to Sit;Sit to Supine    Supine to Sit Independent    Sit to Supine Independent      Transfers   Transfers Sit to Stand;Stand to Sit    Sit to Stand 5: Supervision    Five time sit to stand comments  14.5 seconds with single UE support  from standard chair. Patient require verbal cues for proper completion of test.    Stand to Sit 5: Supervision;4: Min guard    Comments patient able to complete sit <> stand from chair with BUE support, continue to require verbal cues for safety due to impulsivity. responded well to verbal cues during session today.      Ambulation/Gait   Ambulation/Gait Yes    Ambulation/Gait Assistance 5: Supervision;4: Min guard    Ambulation/Gait Assistance Details completed ambulation 275 ft with RW, supervision overlal with 2 instances of CGA. Patient demonstrating more control with gait with vebral cues for slowed pace. Patient demo improved ability to grip and control RW without hand grip attachment, PT educating to remove at this time. PT also educating caregiver on how to appropriately provide cues to promote improved safety with ambulation.    Ambulation Distance (Feet) 275 Feet    Assistive device Rolling walker   removed hand orthotic   Gait Pattern Step-to pattern;Decreased arm swing - right;Decreased arm swing - left;Decreased step length - right;Decreased step length - left;Step-through pattern    Ambulation Surface Level;Indoor      Neuro Re-ed    Neuro Re-ed Details  Completed standing withotu UE support patient able to stand x 4 minutes supervision level and demonstrating no instances of imbalance.      Exercises   Exercises Other Exercises    Other Exercises  completed entire review of current HEP, able to tolerate progression of resistance iwth marchign to red theraband. New handout provided.            Access Code: [email or web address] URL: [email or web address] Date: 02/21/2021 Prepared by: Baldomero Lamy  Exercises Supine Bridge - 1 x daily - 5 x weekly - 2 sets - 10 reps Bent Knee Fallouts - 1 x daily - 5 x weekly - 2 sets - 10 reps Supine March with Resistance Band - 1 x daily - 5 x weekly - 2 sets - 10 reps Sit to Stand with Armchair - 1 x daily - 5 x weekly - 2 sets - 10  reps     PT Education - 02/21/21 1259    Education Details progress toward LTG; updated HEP; educated caregiver on ways to promote safety within group home and appropraite cues to provide.    Person(s) Educated Patient;Caregiver(s)    Methods Explanation    Comprehension Verbalized understanding            PT Short Term Goals - 01/31/21 1321      PT SHORT TERM GOAL #1   Title Patient will be independent with caregiver assistnace with initial HEP for strengthening (All STGS Due: 02/01/21)    Baseline 01/31/21: pt and caregiver with pt report compliance with current program    Time --    Period --    Status Achieved    Target Date --      PT SHORT TERM GOAL #2  Title Patient will demonstrate ability to ambulate > 150 ft with CGA with LRAD for improved household mobility    Baseline 01/31/21: 200 feet with RW with min guard to min assist for safety/balance    Time --    Period --    Status Partially Met      PT SHORT TERM GOAL #3   Title Patient will be able to stand for >/= 1 minute with supervision to demonstrate improved balance    Baseline 01/31/21: met in session today    Time --    Period --    Status Achieved             PT Long Term Goals - 02/21/21 1248      PT LONG TERM GOAL #1   Title Patient will be independent with caregiver assistance with Final HEP (All LTGs Due:    Baseline caregiver/patient report independence    Time 8    Period Weeks    Status Achieved      PT LONG TERM GOAL #2   Title Patient will demonstrate ability to complete all transfers with supervision to demonstrate improved independence and safety.    Baseline CGA to Min A; supervision with transfers    Time 8    Period Weeks    Status Achieved      PT LONG TERM GOAL #3   Title Patient will be able to stand for >/= 4 minutes without assistance to demonstrate improved balance and tolerance for completion of ADL's    Baseline < 30 secs, CGA due to impulsivity; able to stand 4 minutes  without assistance or AD    Time 8    Period Weeks    Status Achieved      PT LONG TERM GOAL #4   Title Patient will demonstrate ability to ambulate > 250 ft with LRAD and close supervision for improved household mobility    Baseline 275 ft with RW, supervision two instance of CGA.    Time 8    Period Weeks    Status Partially Met      PT LONG TERM GOAL #5   Title Patient will improve 5 x sit <> stand to </= 13 seconds to demonstrate reduced fall risk and improved balance    Baseline 18.12; 14.5 seconds with verbal cues    Time 8    Period Weeks    Status Not Met              Plan - 02/21/21 1328    Clinical Impression Statement Today's skilled PT session included assesment of patient's progress toward all LTGs. Patient able to meet LTG 1-4, and made progress toward LTG 5. Patient demo improvements in balance, activity tolerance, strength, and functional mobility with PT services. Patient just shy of meeting LTG 5, but demonstrating improved stability and control with 5x sit <> stand. Patient still currently ambulating with RW for improved safety but able to dmeo improved distance. PT reviewing current HEP with patient and updating to patient's tolerance, and PT educating caregiver on techniques to promote safety. Patient demonstrating readiness for d/c at this time and patient feeling comfortable with d/c.    Personal Factors and Comorbidities Comorbidity 3+;Behavior Pattern;Time since onset of injury/illness/exacerbation;Transportation;Age    Comorbidities GERD, Barrett esophagus, gastroparesis, HTN, Hyperlipidemia, CVA with L sided deficits, mental retardation, schizophrenia, hypothyroidism    Examination-Activity Limitations Bathing;Bed Mobility;Transfers;Dressing;Stairs;Stand    Examination-Participation Restrictions Community Activity;Medication Management    Stability/Clinical Decision Making Evolving/Moderate complexity  Rehab Potential Fair    PT Frequency 1x / week    PT  Duration 8 weeks    PT Treatment/Interventions ADLs/Self Care Home Management;Cryotherapy;Moist Heat;DME Instruction;Gait training;Stair training;Functional mobility training;Therapeutic activities;Therapeutic exercise;Balance training;Neuromuscular re-education;Patient/family education;Orthotic Fit/Training;Manual techniques;Wheelchair mobility training;Passive range of motion    PT Home Exercise Plan Access Code: [email or web address]    Consulted and Agree with Plan of Care Patient;Family member/caregiver           Patient will benefit from skilled therapeutic intervention in order to improve the following deficits and impairments:  Abnormal gait,Decreased coordination,Decreased range of motion,Difficulty walking,Impaired tone,Decreased safety awareness,Decreased endurance,Decreased activity tolerance,Decreased knowledge of use of DME,Impaired perceived functional ability,Impaired vision/preception,Pain,Postural dysfunction,Impaired sensation,Decreased strength,Decreased mobility,Decreased cognition,Decreased balance  Visit Diagnosis: Unsteadiness on feet  Muscle weakness (generalized)  Difficulty in walking, not elsewhere classified     Problem List Patient Active Problem List   Diagnosis Date Noted  . Dilation of esophagus   . Hyponatremia   . Dysphagia   . Seizures ([place])   . S/P percutaneous endoscopic gastrostomy (PEG) tube placement ([place])   . Debility 11/15/2020  . DM II (diabetes mellitus, type II), controlled ([place]) 11/13/2020  . Bowel obstruction ([place])   . S/P repair of paraesophageal hernia 10/26/2020  . Syncope 10/21/2020  . Hiatal hernia 10/21/2020  . Acute hypoxemic respiratory failure ([place]) 10/21/2020  . Sinus tachycardia 10/21/2020  . Gastric polyps   . Altered mental status 09/05/2020  . Emesis 07/24/2015  . Coffee ground emesis   . Gastrointestinal hemorrhage associated with other gastritis   . Acute esophagitis 01/09/2015  . Upper gastrointestinal bleed   . Diarrhea    . Nausea with vomiting   . GI bleed 01/08/2015  . GIB (gastrointestinal bleeding) 01/08/2015  . Nausea & vomiting 01/08/2015  . Reflux esophagitis 07/04/2009  . Loss of weight 07/04/2009  . BLOOD IN STOOL, OCCULT 07/04/2009  . ANEMIA, IRON DEFICIENCY 05/04/2008  . Hypothyroidism 04/05/2008  . Schizophrenia, unspecified type ([place]) 04/05/2008  . Depression 04/05/2008  . HEMIPARESIS, LEFT 04/05/2008  . Essential hypertension 04/05/2008  . GERD 04/05/2008  . Gastroparesis 04/05/2008  . Seizure ([place]) 04/05/2008  . History of diverticulosis 08/21/2004  . Barrett's esophagus 04/01/2002  . HIATAL HERNIA 04/01/2002    Jones Bales, PT, DPT 02/21/2021, 1:39 PM  [facility] [address] [place], [place], [address] Phone: [phone]   Fax:  [phone]  Name: [name] MRN: [id number] Date of Birth: October 04, 1956

## 2021-02-28 ENCOUNTER — Telehealth: Payer: Self-pay | Admitting: Physical Therapy

## 2021-02-28 ENCOUNTER — Ambulatory Visit: Payer: Medicare Other | Admitting: Occupational Therapy

## 2021-02-28 ENCOUNTER — Encounter: Payer: Medicare Other | Admitting: Occupational Therapy

## 2021-02-28 ENCOUNTER — Ambulatory Visit: Payer: Medicare Other

## 2021-02-28 NOTE — Telephone Encounter (Signed)
Note created in error.   Sallyanne Kuster, PTA, [facility] Outpatient Neuro [facility] [address] [place], [place] [address] [phone] 02/28/21, 2:11 PM

## 2021-03-08 ENCOUNTER — Encounter: Payer: Medicare Other | Admitting: Occupational Therapy

## 2021-03-08 ENCOUNTER — Ambulatory Visit: Payer: Medicare Other

## 2021-03-09 ENCOUNTER — Encounter: Payer: Self-pay | Admitting: Neurology

## 2021-03-09 ENCOUNTER — Ambulatory Visit (INDEPENDENT_AMBULATORY_CARE_PROVIDER_SITE_OTHER): Payer: Medicare Other | Admitting: Neurology

## 2021-03-09 VITALS — BP 105/68 | HR 59 | Ht 64.0 in | Wt 159.0 lb

## 2021-03-09 DIAGNOSIS — Z9889 Other specified postprocedural states: Secondary | ICD-10-CM | POA: Insufficient documentation

## 2021-03-09 DIAGNOSIS — I69954 Hemiplegia and hemiparesis following unspecified cerebrovascular disease affecting left non-dominant side: Secondary | ICD-10-CM

## 2021-03-09 HISTORY — DX: Other specified postprocedural states: Z98.890

## 2021-03-09 NOTE — Progress Notes (Signed)
Chief Complaint  Patient presents with  . New Patient (Initial Visit)    He is here with his sister/POA, [name]. He resides in [facility] group home. Hx of CVA. Hx of seizures. Hospital follow up from seizure-like activity in December. His medications were adjusted and no further events have occurred. He is currently taking Depakote DR 500mg , one tab BID and Lamical 100mg , one tab QD.  Insomnia      ASSESSMENT AND PLAN  [name] is a 65 y.o. male   History of benign intracranial tumor surgery, stroke perioperative period of time in 2003/04/24, with residual mild spastic left hemiparesis Seizure  His multiple recurrent increased confusion, fell, passing out episode are most likely related to his paraesophageal hernia, has much improved since surgery, there was no clinical seizure activity reported,  Has been on stable dose of Depakote DR 500 mg twice a day, lamotrigine 100 mg daily, I have suggested him to move her lamotrigine from morning to night to avoid the side effect  Continue physical therapy  Return to clinic in 6 months  Laboratory evaluation including Depakote and lamotrigine level per facility physician, fax to level to our clinic  DIAGNOSTIC DATA (LABS, IMAGING, TESTING) - I reviewed patient records, labs, notes, testing and imaging myself where available.  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  I reviewed and summarized the referring note.  Past medical history Stroke with residual left side weakness, Seizure, Intellectual delay Dysphagia, Barrett's esophagus, GERD with gastroparesis, Hypothyroidism, on supplement Hypertension Hyperlipidemia Schizophrenia Diabetes History of Craniotomy,   Patient had a history of craniotomy for benign brain tumor removal in 24-Apr-2003 at [facility], postsurgically, he developed complications, required a second surgery, which is further complicated by stroke, was residual left-sided weakness.  The history is from his sister, I could not find the medical records through epic system  Patient also had lifelong history of schizophrenia, developmentally delayed, intellectual disability, lived with his father, who passed away in 2007-04-24, eventually he was placed at a group home since Apr 24, 2011,  He began to have seizure following his craniotomy in 04-24-03, was managed by outside neurologist, has been seizure-free for more than 10 years, stable on current medication of Depakote DR 500 mg twice a day, and lamotrigine 100 mg daily  In September 2021, he was found to be confused, altered mental status, hypotension, that was improved with hospital admission, hydration, hospital admission again in November 2021, for aspiration pneumonia, 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, periesophagus hernia, underwent robotic assistant laparoscopy, paraesophageal hernia repair, followed by prolonged rehabilitation,  His condition has gradually stabilized since, doing a process, he was noted to have confusion, near syncope episode, fall at nursing home, because of his history of seizure,  sister reported on further questioning, there was no clinical seizure activity noted  Since the surgery, he has much improved, almost  back to his baseline, he has unsteady gait, carry on simple conversation, has good  appetite, residual left hemiparesis    PHYSICAL EXAM   Vitals:   03/09/21 1054  BP: 105/68  Pulse: (!) 59  Weight: 159 lb (72.1 kg)  Height: 5\' 4"  (1.626 m)   Not recorded     Body mass index is 27.29 kg/m.  PHYSICAL EXAMNIATION:  Gen: NAD, conversant, well nourised, well groomed                     Cardiovascular: Regular rate rhythm, no peripheral edema, warm, nontender. Eyes: Conjunctivae clear without exudates or hemorrhage Neck: Supple, no carotid bruits. Pulmonary: Clear to auscultation bilaterally   NEUROLOGICAL EXAM:  MENTAL STATUS: Speech/cognition: Awake, alert, cooperative on examination, rely on his sister to provide history   CRANIAL NERVES: CN II: Visual fields are full to confrontation. Pupils are round equal and briskly reactive to light. CN III, IV, VI: extraocular movement are normal. No ptosis. CN V: Facial sensation is intact to light touch CN VII: Face is symmetric with normal eye closure  CN VIII: Hearing is normal to causal conversation. CN IX, X: Phonation is normal. CN XI: Head turning and shoulder shrug are intact  MOTOR: Barely antigravity movement of left upper extremity, mild drift of left leg, mild spastic left hemiparesis  REFLEXES: Hyperreflexia on the left side  SENSORY: Intact to light touch, pinprick and vibratory sensation are intact in fingers and toes.  COORDINATION: There is no trunk or limb dysmetria noted.  GAIT/STANCE: Need assistant to get up from seated position, rushed movement, dragging left leg  REVIEW OF SYSTEMS: Full 14 system review of systems performed and notable only for as above All other review of systems were negative.  ALLERGIES: Allergies  Allergen Reactions  . Vancomycin Other (See Comments)    Unknown-possible hives     HOME MEDICATIONS: Current Outpatient Medications  Medication Sig Dispense Refill  . Cholecalciferol (VITAMIN D) 50 MCG (2000 UT) tablet Take 1 tablet (2,000 Units total)  by mouth daily. 30 tablet 0  . divalproex (DEPAKOTE) 500 MG DR tablet Take 1 tablet (500 mg total) by mouth every 12 (twelve) hours. 60 tablet 0  . docusate sodium (COLACE) 100 MG capsule Take 1 capsule (100 mg total) by mouth 2 (two) times daily. 10 capsule 0  . Emollient (CORN HUSKERS EX) Apply 1 application topically in the morning and at bedtime. For dry skin    . lamoTRIgine (LAMICTAL) 100 MG tablet Take 1 tablet (100 mg total) by mouth daily. 30 tablet 0  . levothyroxine (SYNTHROID) 75 MCG tablet Take 1 tablet (75 mcg total) by mouth daily at 6 (six) AM. 30 tablet 0  . metoprolol tartrate (LOPRESSOR) 25 MG tablet Take 1 tablet (25 mg total) by mouth 2 (two) times daily. 60 tablet 0  . ondansetron (ZOFRAN) 4 MG tablet Take 4 mg by mouth 4 (four) times daily as needed for nausea or vomiting.    . pantoprazole (PROTONIX) 40 MG tablet Take 1 tablet (40 mg total) by mouth 2 (two) times daily before a meal. 60 tablet 0  . polyethylene glycol (MIRALAX / GLYCOLAX) 17 g packet Take 17 g by mouth 2 (two) times daily. 14 each 0  . QUEtiapine (SEROQUEL) 400 MG tablet Take 1 tablet (400 mg total) by mouth 2 (two) times daily. 60 tablet 0  . rosuvastatin (CRESTOR) 20 MG tablet Take 1 tablet (20 mg total) by mouth daily. 30  tablet 0   No current facility-administered medications for this visit.    PAST MEDICAL HISTORY: Past Medical History:  Diagnosis Date  . Barrett esophagus   . DM II (diabetes mellitus, type II), controlled ([facility]) 11/13/2020  . Dysphagia   . Gastroparesis   . GERD (gastroesophageal reflux disease)   . Hypercholesteremia   . Hypertension   . Iron deficiency anemia   . Mental retardation   . Schizophrenia ([facility])   . Seizure ([facility])   . Stroke [facility])     PAST SURGICAL HISTORY: Past Surgical History:  Procedure Laterality Date  . BIOPSY  10/21/2020   Procedure: BIOPSY;  Surgeon: Iva Boop, MD;  Location: [facility] ENDOSCOPY;  Service: Endoscopy;;  . CRANIOTOMY FOR CYST  FENESTRATION    . ESOPHAGOGASTRODUODENOSCOPY N/A 01/09/2015   Procedure: ESOPHAGOGASTRODUODENOSCOPY (EGD);  Surgeon: Louis Meckel, MD;  Location: [facility] ENDOSCOPY;  Service: Endoscopy;  Laterality: N/A;  . ESOPHAGOGASTRODUODENOSCOPY N/A 10/26/2020   Procedure: ESOPHAGOGASTRODUODENOSCOPY (EGD);  Surgeon: Corliss Skains, MD;  [facility] OR;  Service: Thoracic;  Laterality: N/A;  . ESOPHAGOGASTRODUODENOSCOPY (EGD) WITH PROPOFOL N/A 10/21/2020   Procedure: ESOPHAGOGASTRODUODENOSCOPY (EGD) WITH PROPOFOL;  Surgeon: Iva Boop, MD;  Location: [facility] ENDOSCOPY;  Service: Endoscopy;  Laterality: N/A;  . IR GASTR TUBE CONVERT GASTR-JEJ PER W/FL MOD SED  11/07/2020  . PEG PLACEMENT N/A 10/26/2020   Procedure: PERCUTANEOUS ENDOSCOPIC GASTROSTOMY (PEG) PLACEMENT;  Surgeon: Corliss Skains, MD;  Location: [facility] OR;  Service: Thoracic;  Laterality: N/A;  . XI ROBOTIC ASSISTED HIATAL HERNIA REPAIR N/A 10/26/2020   Procedure: XI ROBOTIC ASSISTED HIATAL HERNIA REPAIR;  Surgeon: Corliss Skains, MD;  Location: [facility] OR;  Service: Thoracic;  Laterality: N/A;    FAMILY HISTORY: Family History  Problem Relation Age of Onset  . Breast cancer Mother   . Heart disease Father     SOCIAL HISTORY: Social History   Socioeconomic History  . Marital status: Single    Spouse name: Not on file  . Number of children: 0  . Years of education: 24  . Highest education level: High school graduate  Occupational History  . Occupation: Disabled  Tobacco Use  . Smoking status: Never Smoker  . Smokeless tobacco: Never Used  Vaping Use  . Vaping Use: Never used  Substance and Sexual Activity  . Alcohol use: No  . Drug use: Never  . Sexual activity: Never  Other Topics Concern  . Not on file  Social History Narrative   Lives at [facility] group home (ph: [phone]).   Left-handed.   No daily caffeine use.   Social Determinants of Health   Financial Resource Strain: Not on file  Food Insecurity: Not on file   Transportation Needs: Not on file  Physical Activity: Not on file  Stress: Not on file  Social Connections: Not on file  Intimate Partner Violence: Not on file      Levert Feinstein, M.D. Ph.D.  [facility] [address] [place], [place] [address] Ph: [phone] Fax: [phone]  CC:  [name], [name], PA-C [address] [place],  [place] [address]  Lucretia Field

## 2021-03-27 ENCOUNTER — Ambulatory Visit: Payer: Medicare Other | Admitting: Neurology

## 2021-03-27 ENCOUNTER — Inpatient Hospital Stay: Payer: Medicare Other | Admitting: Physical Medicine and Rehabilitation

## 2021-03-28 ENCOUNTER — Encounter: Payer: Self-pay | Admitting: Physical Medicine and Rehabilitation

## 2021-03-28 ENCOUNTER — Encounter
Payer: Medicare Other | Attending: Physical Medicine and Rehabilitation | Admitting: Physical Medicine and Rehabilitation

## 2021-03-28 ENCOUNTER — Other Ambulatory Visit: Payer: Self-pay

## 2021-03-28 VITALS — BP 116/71 | HR 63 | Temp 98.5°F | Ht 64.0 in | Wt 155.2 lb

## 2021-03-28 DIAGNOSIS — Z9889 Other specified postprocedural states: Secondary | ICD-10-CM | POA: Insufficient documentation

## 2021-03-28 DIAGNOSIS — R531 Weakness: Secondary | ICD-10-CM | POA: Insufficient documentation

## 2021-03-28 DIAGNOSIS — R252 Cramp and spasm: Secondary | ICD-10-CM | POA: Insufficient documentation

## 2021-03-28 DIAGNOSIS — I69398 Other sequelae of cerebral infarction: Secondary | ICD-10-CM | POA: Diagnosis present

## 2021-03-28 DIAGNOSIS — R54 Age-related physical debility: Secondary | ICD-10-CM | POA: Diagnosis not present

## 2021-03-28 NOTE — Progress Notes (Signed)
Subjective:    Patient ID: [name], male    DOB: 1956-08-06, 65 y.o.   MRN: [id number]  HPI  [name] is a 65 year old man admitted to CIR with debility.  He has been going very well at his group home. Accompanied by a staff member today.   He has been receiving therapy regularly, and is progressing well.  He has been ambulating with RW  He has followed with neurology and requires no medication refills.  Has no bracing at home.    Pain Inventory Average Pain 0 Pain Right Now 0 My pain is No Pain her for Stroke follow up  LOCATION OF PAIN  No pain  BOWEL Number of stools per week: 7 Oral laxative use Yes  Type of laxative Miralax Enema or suppository use No  History of colostomy No  Incontinent No   BLADDER Normal In and out cath, frequency N/A Able to self cath No  Bladder incontinence No  Frequent urination No  Leakage with coughing No  Difficulty starting stream No  Incomplete bladder emptying No    Mobility use a walker how many minutes can you walk? Unknown ability to climb steps?  yes do you drive?  no Do you have any goals in this area?  yes  Function retired I need assistance with the following:  bathing, toileting, meal prep and household duties Do you have any goals in this area?  yes  Neuro/Psych trouble walking    Family History  Problem Relation Age of Onset  . Breast cancer Mother   . Heart disease Father    Social History   Socioeconomic History  . Marital status: Single    Spouse name: Not on file  . Number of children: 0  . Years of education: 8  . Highest education level: High school graduate  Occupational History  . Occupation: Disabled  Tobacco Use  . Smoking status: Never Smoker  . Smokeless tobacco: Never Used  Vaping Use  . Vaping Use: Never used  Substance and Sexual Activity  . Alcohol use: No  . Drug use: Never  . Sexual activity: Never  Other Topics Concern  . Not on file  Social  History Narrative   Lives at [facility] group home (ph: [phone]).   Left-handed.   No daily caffeine use.   Social Determinants of Health   Financial Resource Strain: Not on file  Food Insecurity: Not on file  Transportation Needs: Not on file  Physical Activity: Not on file  Stress: Not on file  Social Connections: Not on file   Past Surgical History:  Procedure Laterality Date  . BIOPSY  10/21/2020   Procedure: BIOPSY;  Surgeon: Iva Boop, MD;  Location: [facility] ENDOSCOPY;  Service: Endoscopy;;  . CRANIOTOMY FOR CYST FENESTRATION    . ESOPHAGOGASTRODUODENOSCOPY N/A 01/09/2015   Procedure: ESOPHAGOGASTRODUODENOSCOPY (EGD);  Surgeon: Louis Meckel, MD;  Location: [facility] ENDOSCOPY;  Service: Endoscopy;  Laterality: N/A;  . ESOPHAGOGASTRODUODENOSCOPY N/A 10/26/2020   Procedure: ESOPHAGOGASTRODUODENOSCOPY (EGD);  Surgeon: Corliss Skains, MD;  Location: [facility] OR;  Service: Thoracic;  Laterality: N/A;  . ESOPHAGOGASTRODUODENOSCOPY (EGD) WITH PROPOFOL N/A 10/21/2020   Procedure: ESOPHAGOGASTRODUODENOSCOPY (EGD) WITH PROPOFOL;  Surgeon: Iva Boop, MD;  Location: [facility] ENDOSCOPY;  Service: Endoscopy;  Laterality: N/A;  . IR GASTR TUBE CONVERT GASTR-JEJ PER W/FL MOD SED  11/07/2020  . PEG PLACEMENT N/A 10/26/2020   Procedure: PERCUTANEOUS ENDOSCOPIC GASTROSTOMY (PEG) PLACEMENT;  Surgeon: Corliss Skains, MD;  Location: [facility] OR;  Service: Thoracic;  Laterality: N/A;  . XI ROBOTIC ASSISTED HIATAL HERNIA REPAIR N/A 10/26/2020   Procedure: XI ROBOTIC ASSISTED HIATAL HERNIA REPAIR;  Surgeon: Corliss Skains, MD;  Location: [facility] OR;  Service: Thoracic;  Laterality: N/A;   Past Medical History:  Diagnosis Date  . Barrett esophagus   . DM II (diabetes mellitus, type II), controlled (HCC) 11/13/2020  . Dysphagia   . Gastroparesis   . GERD (gastroesophageal reflux disease)   . Hypercholesteremia   . Hypertension   . Iron deficiency anemia   . Mental retardation   . Schizophrenia (HCC)    . Seizure (HCC)   . Stroke (HCC)    BP 116/71   Pulse 63   Temp 98.5 F (36.9 C)   Ht 5\' 4"  (1.626 m)   Wt 155 lb 3.2 oz (70.4 kg)   SpO2 95%   BMI 26.64 kg/m   Opioid Risk Score:   Fall Risk Score:  `1  Depression screen PHQ 2/9  Depression screen PHQ 2/9 03/28/2021  Decreased Interest 2  Down, Depressed, Hopeless 2  PHQ - 2 Score 4  Altered sleeping 0  Tired, decreased energy 1  Change in appetite 0  Feeling bad or failure about yourself  0  Trouble concentrating 1  Moving slowly or fidgety/restless 0  Suicidal thoughts 0  PHQ-9 Score 6   Review of Systems  Musculoskeletal: Positive for gait problem.  Neurological: Positive for speech difficulty.  All other systems reviewed and are negative.      Objective:   Physical Exam Gen: no distress, normal appearing HEENT: oral mucosa pink and moist, NCAT Cardio: Reg rate Chest: normal effort, normal rate of breathing Abd: soft, non-distended Ext: no edema Psych: pleasant, normal affect Skin: intact Neuro: Alert  Musculoskeletal: left side decreased strength, 2/5 wrist extension, 0/5 PF. MAS 1 in left EF, WF, KF     Assessment & Plan:  [name] is a 65 year old man who presents for hospital follow-up after CIR admission for debility.   1) Debility: -continue PT and OT, mobility is much improved.   2) post-stroke spasticity: -emphasized the importance of daily stretching -MAS 1 currently, discussed Botox can be helpful if worsens  3) post-stroke weakness -Will place order to [facility] for left wrist WHO and left AFO.

## 2021-04-05 ENCOUNTER — Other Ambulatory Visit: Payer: Self-pay | Admitting: Thoracic Surgery (Cardiothoracic Vascular Surgery)

## 2021-04-05 DIAGNOSIS — K2289 Other specified disease of esophagus: Secondary | ICD-10-CM

## 2021-04-06 ENCOUNTER — Ambulatory Visit (INDEPENDENT_AMBULATORY_CARE_PROVIDER_SITE_OTHER): Payer: Medicare Other | Admitting: Thoracic Surgery (Cardiothoracic Vascular Surgery)

## 2021-04-06 ENCOUNTER — Ambulatory Visit
Admission: RE | Admit: 2021-04-06 | Discharge: 2021-04-06 | Disposition: A | Discharge status: Home / Self Care | Payer: Medicare Other | Source: Ambulatory Visit | Attending: Thoracic Surgery (Cardiothoracic Vascular Surgery) | Admitting: Thoracic Surgery (Cardiothoracic Vascular Surgery)

## 2021-04-06 ENCOUNTER — Encounter: Payer: Self-pay | Admitting: Thoracic Surgery (Cardiothoracic Vascular Surgery)

## 2021-04-06 ENCOUNTER — Other Ambulatory Visit: Payer: Self-pay | Admitting: Thoracic Surgery (Cardiothoracic Vascular Surgery)

## 2021-04-06 ENCOUNTER — Other Ambulatory Visit: Payer: Self-pay

## 2021-04-06 VITALS — BP 135/80 | HR 77 | Resp 20 | Ht 64.0 in | Wt 156.0 lb

## 2021-04-06 DIAGNOSIS — Z9889 Other specified postprocedural states: Secondary | ICD-10-CM | POA: Diagnosis not present

## 2021-04-06 DIAGNOSIS — K2289 Other specified disease of esophagus: Secondary | ICD-10-CM

## 2021-04-06 NOTE — Progress Notes (Signed)
     [address]       [place] [address]             [phone]       Mr. Main comes in for follow-up.  He has no complaints.  He is tolerating food without any complications.  All of his incisional sites have healed.  Vitals:   04/06/21 1409  BP: 135/80  Pulse: 77  Resp: 20  SpO2: 95%   Alert NAD Regular rate Easy work of breathing Abdomen soft  Chest x-ray reviewed.  There does not appear to be a recurrence of his hiatal hernia.  65 year old male status post repair along with PEG tube placement.  PEG tube was removed he has no complaints from a digestive standpoint.  Follow-up as needed.

## 2021-04-07 ENCOUNTER — Ambulatory Visit: Payer: Medicare Other | Admitting: Thoracic Surgery (Cardiothoracic Vascular Surgery)

## 2021-05-08 ENCOUNTER — Other Ambulatory Visit: Payer: Self-pay

## 2021-05-08 ENCOUNTER — Encounter (HOSPITAL_BASED_OUTPATIENT_CLINIC_OR_DEPARTMENT_OTHER): Payer: Self-pay

## 2021-05-08 ENCOUNTER — Emergency Department (HOSPITAL_BASED_OUTPATIENT_CLINIC_OR_DEPARTMENT_OTHER)
Admission: EM | Admit: 2021-05-08 | Discharge: 2021-05-08 | Disposition: A | Discharge status: Home / Self Care | Payer: Medicare Other | Attending: Emergency Medicine | Admitting: Emergency Medicine

## 2021-05-08 ENCOUNTER — Emergency Department (HOSPITAL_BASED_OUTPATIENT_CLINIC_OR_DEPARTMENT_OTHER): Payer: Medicare Other

## 2021-05-08 DIAGNOSIS — M25521 Pain in right elbow: Secondary | ICD-10-CM | POA: Insufficient documentation

## 2021-05-08 DIAGNOSIS — E119 Type 2 diabetes mellitus without complications: Secondary | ICD-10-CM | POA: Insufficient documentation

## 2021-05-08 DIAGNOSIS — W010XXA Fall on same level from slipping, tripping and stumbling without subsequent striking against object, initial encounter: Secondary | ICD-10-CM | POA: Diagnosis not present

## 2021-05-08 DIAGNOSIS — E039 Hypothyroidism, unspecified: Secondary | ICD-10-CM | POA: Insufficient documentation

## 2021-05-08 DIAGNOSIS — Z79899 Other long term (current) drug therapy: Secondary | ICD-10-CM | POA: Diagnosis not present

## 2021-05-08 DIAGNOSIS — S5002XA Contusion of left elbow, initial encounter: Secondary | ICD-10-CM | POA: Diagnosis not present

## 2021-05-08 DIAGNOSIS — Y92009 Unspecified place in unspecified non-institutional (private) residence as the place of occurrence of the external cause: Secondary | ICD-10-CM | POA: Insufficient documentation

## 2021-05-08 DIAGNOSIS — W19XXXA Unspecified fall, initial encounter: Secondary | ICD-10-CM

## 2021-05-08 DIAGNOSIS — I10 Essential (primary) hypertension: Secondary | ICD-10-CM | POA: Insufficient documentation

## 2021-05-08 DIAGNOSIS — S59902A Unspecified injury of left elbow, initial encounter: Secondary | ICD-10-CM | POA: Diagnosis present

## 2021-05-08 NOTE — ED Triage Notes (Signed)
Brought in by EMS for unwitnessed fall, pt found on ground in group home PTA.  Group home states he has fallen recently including one this morning.  Denies any change from baseline, pt denies any pain or injury but group home would like him to be evaluated for any injury.

## 2021-05-08 NOTE — Discharge Instructions (Addendum)
You were seen in the emergency department for evaluation of injuries from a fall.  You had a CAT scan of your head and neck along with x-rays of your left elbow that did not show any acute traumatic findings.  Please schedule follow-up appointment with your primary care doctor.  Return to the emergency department for any worsening or concerning symptoms

## 2021-05-08 NOTE — ED Provider Notes (Signed)
[facility] EMERGENCY DEPARTMENT Provider Note   CSN: [id number] Arrival date & time: 05/08/21  1204     History Chief Complaint  Patient presents with  . Fall    [name] is a 65 y.o. male.  He has a history of stroke schizophrenia and MR. Level 5 caveat secondary to decreased fund of knowledge.  He is brought in by ambulance after 2 falls.  He denies any injury.  He was placed in a cervical collar by EMS.  The history is provided by the patient and a relative.  Fall This is a new problem. The current episode started 3 to 5 hours ago. The problem has not changed since onset.Pertinent negatives include no chest pain, no abdominal pain, no headaches and no shortness of breath. Nothing aggravates the symptoms. Nothing relieves the symptoms. He has tried nothing for the symptoms. The treatment provided no relief.       Past Medical History:  Diagnosis Date  . Barrett esophagus   . DM II (diabetes mellitus, type II), controlled (HCC) 11/13/2020  . Dysphagia   . Gastroparesis   . GERD (gastroesophageal reflux disease)   . Hypercholesteremia   . Hypertension   . Iron deficiency anemia   . Mental retardation   . Schizophrenia (HCC)   . Seizure (HCC)   . Stroke [facility])     Patient Active Problem List   Diagnosis Date Noted  . S/P craniotomy 03/09/2021  . Dilation of esophagus   . Hyponatremia   . Dysphagia   . Seizures (HCC)   . S/P percutaneous endoscopic gastrostomy (PEG) tube placement (HCC)   . Debility 11/15/2020  . DM II (diabetes mellitus, type II), controlled (HCC) 11/13/2020  . Bowel obstruction (HCC)   . S/P repair of paraesophageal hernia 10/26/2020  . Syncope 10/21/2020  . Hiatal hernia 10/21/2020  . Acute hypoxemic respiratory failure (HCC) 10/21/2020  . Sinus tachycardia 10/21/2020  . Gastric polyps   . Altered mental status 09/05/2020  . Emesis 07/24/2015  . Coffee ground emesis   . Gastrointestinal hemorrhage associated with other  gastritis   . Acute esophagitis 01/09/2015  . Upper gastrointestinal bleed   . Diarrhea   . Nausea with vomiting   . GI bleed 01/08/2015  . GIB (gastrointestinal bleeding) 01/08/2015  . Nausea & vomiting 01/08/2015  . Reflux esophagitis 07/04/2009  . Loss of weight 07/04/2009  . BLOOD IN STOOL, OCCULT 07/04/2009  . ANEMIA, IRON DEFICIENCY 05/04/2008  . Hypothyroidism 04/05/2008  . Schizophrenia, unspecified type (HCC) 04/05/2008  . Depression 04/05/2008  . Hemiplegia (HCC) 04/05/2008  . Essential hypertension 04/05/2008  . GERD 04/05/2008  . Gastroparesis 04/05/2008  . Seizure (HCC) 04/05/2008  . History of diverticulosis 08/21/2004  . Barrett's esophagus 04/01/2002  . HIATAL HERNIA 04/01/2002    Past Surgical History:  Procedure Laterality Date  . BIOPSY  10/21/2020   Procedure: BIOPSY;  Surgeon: Iva Boop, MD;  Location: [facility] ENDOSCOPY;  Service: Endoscopy;;  . CRANIOTOMY FOR CYST FENESTRATION    . ESOPHAGOGASTRODUODENOSCOPY N/A 01/09/2015   Procedure: ESOPHAGOGASTRODUODENOSCOPY (EGD);  Surgeon: Louis Meckel, MD;  Location: [facility] ENDOSCOPY;  Service: Endoscopy;  Laterality: N/A;  . ESOPHAGOGASTRODUODENOSCOPY N/A 10/26/2020   Procedure: ESOPHAGOGASTRODUODENOSCOPY (EGD);  Surgeon: Corliss Skains, MD;  Location: [facility] OR;  Service: Thoracic;  Laterality: N/A;  . ESOPHAGOGASTRODUODENOSCOPY (EGD) WITH PROPOFOL N/A 10/21/2020   Procedure: ESOPHAGOGASTRODUODENOSCOPY (EGD) WITH PROPOFOL;  Surgeon: Iva Boop, MD;  Location: [facility] ENDOSCOPY;  Service: Endoscopy;  Laterality:  N/A;  . IR GASTR TUBE CONVERT GASTR-JEJ PER W/FL MOD SED  11/07/2020  . PEG PLACEMENT N/A 10/26/2020   Procedure: PERCUTANEOUS ENDOSCOPIC GASTROSTOMY (PEG) PLACEMENT;  Surgeon: Corliss Skains, MD;  Location: [facility] OR;  Service: Thoracic;  Laterality: N/A;  . XI ROBOTIC ASSISTED HIATAL HERNIA REPAIR N/A 10/26/2020   Procedure: XI ROBOTIC ASSISTED HIATAL HERNIA REPAIR;  Surgeon: Corliss Skains, MD;   Location: [facility] OR;  Service: Thoracic;  Laterality: N/A;       Family History  Problem Relation Age of Onset  . Breast cancer Mother   . Heart disease Father     Social History   Tobacco Use  . Smoking status: Never Smoker  . Smokeless tobacco: Never Used  Vaping Use  . Vaping Use: Never used  Substance Use Topics  . Alcohol use: No  . Drug use: Never    Home Medications Prior to Admission medications   Medication Sig Start Date End Date Taking? Authorizing Provider  Cholecalciferol (VITAMIN D) 50 MCG (2000 UT) tablet Take 1 tablet (2,000 Units total) by mouth daily. 12/06/20   Angiulli, Mcarthur Rossetti, PA-C  divalproex (DEPAKOTE) 500 MG DR tablet Take 1 tablet (500 mg total) by mouth every 12 (twelve) hours. 12/07/20   Angiulli, Mcarthur Rossetti, PA-C  docusate sodium (COLACE) 100 MG capsule Take 1 capsule (100 mg total) by mouth 2 (two) times daily. 12/06/20   Angiulli, Mcarthur Rossetti, PA-C  Emollient (CORN HUSKERS EX) Apply 1 application topically in the morning and at bedtime. For dry skin    [provider]  lamoTRIgine (LAMICTAL) 100 MG tablet Take 1 tablet (100 mg total) by mouth daily. 12/07/20   Angiulli, Mcarthur Rossetti, PA-C  levothyroxine (SYNTHROID) 75 MCG tablet Take 1 tablet (75 mcg total) by mouth daily at 6 (six) AM. 12/07/20   Angiulli, Mcarthur Rossetti, PA-C  metoprolol tartrate (LOPRESSOR) 25 MG tablet Take 1 tablet (25 mg total) by mouth 2 (two) times daily. 12/07/20   Angiulli, Mcarthur Rossetti, PA-C  ondansetron (ZOFRAN) 4 MG tablet Take 4 mg by mouth 4 (four) times daily as needed for nausea or vomiting.    [provider]  pantoprazole (PROTONIX) 40 MG tablet Take 1 tablet (40 mg total) by mouth 2 (two) times daily before a meal. 12/06/20   Angiulli, Mcarthur Rossetti, PA-C  polyethylene glycol (MIRALAX / GLYCOLAX) 17 g packet Take 17 g by mouth 2 (two) times daily. 12/06/20   Angiulli, Mcarthur Rossetti, PA-C  QUEtiapine (SEROQUEL) 400 MG tablet Take 1 tablet (400 mg total) by mouth 2 (two) times  daily. 12/06/20   Angiulli, Mcarthur Rossetti, PA-C  rosuvastatin (CRESTOR) 20 MG tablet Take 1 tablet (20 mg total) by mouth daily. 12/07/20   Angiulli, Mcarthur Rossetti, PA-C    Allergies    Vancomycin  Review of Systems   Review of Systems  Constitutional: Negative for fever.  HENT: Negative for sore throat.   Eyes: Negative for visual disturbance.  Respiratory: Negative for shortness of breath.   Cardiovascular: Negative for chest pain.  Gastrointestinal: Negative for abdominal pain.  Genitourinary: Negative for dysuria.  Musculoskeletal: Negative for neck pain.  Skin: Negative for rash.  Neurological: Negative for headaches.    Physical Exam Updated Vital Signs BP 114/80 (BP Location: Right Arm)   Pulse (!) 59   Temp 98 F (36.7 C) (Oral)   Resp 20   SpO2 96%   Physical Exam Vitals and nursing note reviewed.  Constitutional:  Appearance: Normal appearance. He is well-developed.  HENT:     Head: Normocephalic and atraumatic.  Eyes:     Conjunctiva/sclera: Conjunctivae normal.  Cardiovascular:     Rate and Rhythm: Normal rate and regular rhythm.     Heart sounds: No murmur heard.   Pulmonary:     Effort: Pulmonary effort is normal. No respiratory distress.     Breath sounds: Normal breath sounds.  Abdominal:     Palpations: Abdomen is soft.     Tenderness: There is no abdominal tenderness.  Musculoskeletal:        General: Tenderness present. No deformity or signs of injury. Normal range of motion.     Cervical back: Neck supple.     Comments: He has some tenderness over his right elbow.  Full range of motion of upper and lower extremities without any limitations.  No obvious deformities.  Skin:    General: Skin is warm and dry.  Neurological:     General: No focal deficit present.     Mental Status: He is alert. Mental status is at baseline.     ED Results / Procedures / Treatments   Labs (all labs ordered are listed, but only abnormal results are  displayed) Labs Reviewed - No data to display  EKG None  Radiology DG Elbow Complete Left  Result Date: 05/08/2021 CLINICAL DATA:  Larey Seat.  Elbow pain. EXAM: LEFT ELBOW - COMPLETE 3+ VIEW COMPARISON:  None. FINDINGS: The joint spaces are maintained. No acute fracture. No osteochondral lesion. No joint effusion. IMPRESSION: No acute bony findings. Electronically Signed   By: Rudie Meyer M.D.   On: 05/08/2021 14:14   CT Head Wo Contrast  Result Date: 05/08/2021 CLINICAL DATA:  Unwitnessed fall today. EXAM: CT HEAD WITHOUT CONTRAST CT CERVICAL SPINE WITHOUT CONTRAST TECHNIQUE: Multidetector CT imaging of the head and cervical spine was performed following the standard protocol without intravenous contrast. Multiplanar CT image reconstructions of the cervical spine were also generated. COMPARISON:  Head CT from 2016 FINDINGS: CT HEAD FINDINGS Brain: Stable large area of encephalomalacia involving the right frontal lobe likely related to prior surgery. There is associated ex vacuo dilatation of the frontal horn of the right lateral ventricle. The ventricles are in the midline without mass effect or shift. No acute extra-axial fluid collections are identified. No CT findings for acute intracranial hemorrhage or hemispheric infarction. Brainstem and cerebellum are grossly normal. Vascular: Vascular calcifications but no aneurysm or hyperdense vessels. Skull: Surgical changes related to a prior frontal craniotomy. No acute bony findings. Sinuses/Orbits: The paranasal sinuses and mastoid air cells are clear. The globes are intact. Other: No scalp lesions or scalp hematoma. CT CERVICAL SPINE FINDINGS Alignment: Normal Skull base and vertebrae: No acute fracture. No primary bone lesion or focal pathologic process. Soft tissues and spinal canal: No prevertebral fluid or swelling. No visible canal hematoma. Disc levels: Degenerative cervical spondylosis with disc disease and facet disease but no large disc  protrusions, spinal or foraminal stenosis. Upper chest: The lung apices are grossly clear. Marked distention of the upper esophagus noted. Other: No neck mass or adenopathy. IMPRESSION: 1. Stable large area of encephalomalacia involving the right frontal lobe likely related to prior tumor is section. 2. No acute intracranial findings or skull fracture. 3. Normal alignment of the cervical vertebral bodies and no acute cervical spine fracture. 4. Degenerative cervical spondylosis with disc disease and facet disease but no large disc protrusions, spinal or foraminal stenosis. 5. Marked distention of the  upper esophagus. Electronically Signed   By: Rudie MeyerP.  Gallerani M.D.   On: 05/08/2021 14:13   CT Cervical Spine Wo Contrast  Result Date: 05/08/2021 CLINICAL DATA:  Unwitnessed fall today. EXAM: CT HEAD WITHOUT CONTRAST CT CERVICAL SPINE WITHOUT CONTRAST TECHNIQUE: Multidetector CT imaging of the head and cervical spine was performed following the standard protocol without intravenous contrast. Multiplanar CT image reconstructions of the cervical spine were also generated. COMPARISON:  Head CT from 2016 FINDINGS: CT HEAD FINDINGS Brain: Stable large area of encephalomalacia involving the right frontal lobe likely related to prior surgery. There is associated ex vacuo dilatation of the frontal horn of the right lateral ventricle. The ventricles are in the midline without mass effect or shift. No acute extra-axial fluid collections are identified. No CT findings for acute intracranial hemorrhage or hemispheric infarction. Brainstem and cerebellum are grossly normal. Vascular: Vascular calcifications but no aneurysm or hyperdense vessels. Skull: Surgical changes related to a prior frontal craniotomy. No acute bony findings. Sinuses/Orbits: The paranasal sinuses and mastoid air cells are clear. The globes are intact. Other: No scalp lesions or scalp hematoma. CT CERVICAL SPINE FINDINGS Alignment: Normal Skull base and  vertebrae: No acute fracture. No primary bone lesion or focal pathologic process. Soft tissues and spinal canal: No prevertebral fluid or swelling. No visible canal hematoma. Disc levels: Degenerative cervical spondylosis with disc disease and facet disease but no large disc protrusions, spinal or foraminal stenosis. Upper chest: The lung apices are grossly clear. Marked distention of the upper esophagus noted. Other: No neck mass or adenopathy. IMPRESSION: 1. Stable large area of encephalomalacia involving the right frontal lobe likely related to prior tumor is section. 2. No acute intracranial findings or skull fracture. 3. Normal alignment of the cervical vertebral bodies and no acute cervical spine fracture. 4. Degenerative cervical spondylosis with disc disease and facet disease but no large disc protrusions, spinal or foraminal stenosis. 5. Marked distention of the upper esophagus. Electronically Signed   By: Rudie MeyerP.  Gallerani M.D.   On: 05/08/2021 14:13    Procedures Procedures   Medications Ordered in ED Medications - No data to display  ED Course  I have reviewed the triage vital signs and the nursing notes.  Pertinent labs & imaging results that were available during my care of the patient were reviewed by me and considered in my medical decision making (see chart for details).  Clinical Course as of 05/08/21 1911  Mon May 08, 2021  1420 Reviewed results with patient and sister.  She is comfortable plan for discharge.  Return instructions discussed [MB]    Clinical Course User Index [MB] Terrilee FilesButler, Miyonna Ormiston C, MD   MDM Rules/Calculators/A&P                          Differential diagnosis includes skull fracture, intracranial bleed, cervical fracture, dislocation, muscle strain Final Clinical Impression(s) / ED Diagnoses Final diagnoses:  Fall, initial encounter  Contusion of left elbow, initial encounter    Rx / DC Orders ED Discharge Orders    None       Terrilee FilesButler, Jaylene Arrowood C,  MD 05/08/21 1912

## 2021-05-08 NOTE — ED Triage Notes (Signed)
Per ems pt got tangled in walker and fell,  Staff at the home does not know if he hit his head or has any other inj  Want him checked out

## 2021-05-29 ENCOUNTER — Telehealth: Payer: Self-pay | Admitting: Physical Medicine and Rehabilitation

## 2021-05-29 NOTE — Telephone Encounter (Signed)
Per voicemail from [employer] missing current CNM for no signature - it was refaxed on [id number]-- please refax to [phone] or call michelle at [phone]

## 2021-05-30 ENCOUNTER — Encounter
Payer: Medicare Other | Attending: Physical Medicine and Rehabilitation | Admitting: Physical Medicine and Rehabilitation

## 2021-05-30 ENCOUNTER — Other Ambulatory Visit: Payer: Self-pay

## 2021-05-30 ENCOUNTER — Encounter: Payer: Self-pay | Admitting: Physical Medicine and Rehabilitation

## 2021-05-30 VITALS — BP 151/88 | HR 74 | Temp 98.5°F | Ht 64.0 in | Wt 159.6 lb

## 2021-05-30 DIAGNOSIS — R5381 Other malaise: Secondary | ICD-10-CM | POA: Diagnosis not present

## 2021-05-30 DIAGNOSIS — I10 Essential (primary) hypertension: Secondary | ICD-10-CM | POA: Diagnosis not present

## 2021-05-30 DIAGNOSIS — I69398 Other sequelae of cerebral infarction: Secondary | ICD-10-CM | POA: Diagnosis not present

## 2021-05-30 DIAGNOSIS — R252 Cramp and spasm: Secondary | ICD-10-CM | POA: Diagnosis present

## 2021-05-30 DIAGNOSIS — Z9889 Other specified postprocedural states: Secondary | ICD-10-CM | POA: Diagnosis not present

## 2021-05-30 MED ORDER — BACLOFEN 5 MG PO TABS: 1.0000 | ORAL_TABLET | Freq: Three times a day (TID) | ORAL | 1 refills | Status: DC

## 2021-05-30 NOTE — Progress Notes (Signed)
Subjective:    Patient ID: [name], male    DOB: [birth year], 65 y.o.   MRN: [id number]  HPI  [name] is a 65 year old man admitted to CIR with debility.  He has been going very well at his group home. Accompanied by a staff member today. He walks every evening which I commended!  He had been receiving therapy regularly, and was progressing well. Has now completed therapy and is not receiving any therapy at facility.   He has had multiple falls since last visit.  He has been ambulating with RW  He has followed with neurology and requires no medication refills.  He recently received left hand and foot orthotics- wearing AFO now and uses wrist splint at night  Denies pain   Pain Inventory Average Pain 0 Pain Right Now 0 My pain is No Pain her for Stroke follow up  LOCATION OF PAIN  No pain  BOWEL Number of stools per week: 7 Oral laxative use Yes  Type of laxative Miralax Enema or suppository use No  History of colostomy No  Incontinent No   BLADDER Normal In and out cath, frequency N/A Able to self cath No  Bladder incontinence No  Frequent urination No  Leakage with coughing No  Difficulty starting stream No  Incomplete bladder emptying No    Mobility use a walker how many minutes can you walk? 5-10 ability to climb steps?  yes do you drive?  no needs help with transfers  Function retired I need assistance with the following:  dressing, bathing, toileting, meal prep, household duties and shopping  Neuro/Psych trouble walking    Family History  Problem Relation Age of Onset  . Breast cancer Mother   . Heart disease Father    Social History   Socioeconomic History  . Marital status: Single    Spouse name: Not on file  . Number of children: 0  . Years of education: 51  . Highest education level: High school graduate  Occupational History  . Occupation: Disabled  Tobacco Use  . Smoking status: Never Smoker  . Smokeless  tobacco: Never Used  Vaping Use  . Vaping Use: Never used  Substance and Sexual Activity  . Alcohol use: No  . Drug use: Never  . Sexual activity: Never  Other Topics Concern  . Not on file  Social History Narrative   Lives at [facility] group home (ph: [phone]).   Left-handed.   No daily caffeine use.   Social Determinants of Health   Financial Resource Strain: Not on file  Food Insecurity: Not on file  Transportation Needs: Not on file  Physical Activity: Not on file  Stress: Not on file  Social Connections: Not on file   Past Surgical History:  Procedure Laterality Date  . BIOPSY  10/21/2020   Procedure: BIOPSY;  Surgeon: Iva Boop, MD;  Location: [facility] ENDOSCOPY;  Service: Endoscopy;;  . CRANIOTOMY FOR CYST FENESTRATION    . ESOPHAGOGASTRODUODENOSCOPY N/A 01/09/2015   Procedure: ESOPHAGOGASTRODUODENOSCOPY (EGD);  Surgeon: Louis Meckel, MD;  Location: [facility] ENDOSCOPY;  Service: Endoscopy;  Laterality: N/A;  . ESOPHAGOGASTRODUODENOSCOPY N/A 10/26/2020   Procedure: ESOPHAGOGASTRODUODENOSCOPY (EGD);  Surgeon: Corliss Skains, MD;  Location: [facility] OR;  Service: Thoracic;  Laterality: N/A;  . ESOPHAGOGASTRODUODENOSCOPY (EGD) WITH PROPOFOL N/A 10/21/2020   Procedure: ESOPHAGOGASTRODUODENOSCOPY (EGD) WITH PROPOFOL;  Surgeon: Iva Boop, MD;  Location: [facility] ENDOSCOPY;  Service: Endoscopy;  Laterality: N/A;  . IR GASTR TUBE CONVERT  GASTR-JEJ PER W/FL MOD SED  11/07/2020  . PEG PLACEMENT N/A 10/26/2020   Procedure: PERCUTANEOUS ENDOSCOPIC GASTROSTOMY (PEG) PLACEMENT;  Surgeon: Corliss Skains, MD;  Location: [facility] OR;  Service: Thoracic;  Laterality: N/A;  . XI ROBOTIC ASSISTED HIATAL HERNIA REPAIR N/A 10/26/2020   Procedure: XI ROBOTIC ASSISTED HIATAL HERNIA REPAIR;  Surgeon: Corliss Skains, MD;  Location: [facility] OR;  Service: Thoracic;  Laterality: N/A;   Past Medical History:  Diagnosis Date  . Barrett esophagus   . DM II (diabetes mellitus, type II), controlled (HCC)  11/13/2020  . Dysphagia   . Gastroparesis   . GERD (gastroesophageal reflux disease)   . Hypercholesteremia   . Hypertension   . Iron deficiency anemia   . Mental retardation   . Schizophrenia (HCC)   . Seizure (HCC)   . Stroke (HCC)    BP (!) 151/88   Pulse 74   Temp 98.5 F (36.9 C)   Ht 5\' 4"  (1.626 m)   Wt 159 lb 9.6 oz (72.4 kg)   SpO2 92%   BMI 27.40 kg/m   Opioid Risk Score:   Fall Risk Score:  `1  Depression screen PHQ 2/9  Depression screen [facility] 2/9 05/30/2021 03/28/2021  Decreased Interest 2 2  Down, Depressed, Hopeless 2 2  PHQ - 2 Score 4 4  Altered sleeping - 0  Tired, decreased energy - 1  Change in appetite - 0  Feeling bad or failure about yourself  - 0  Trouble concentrating - 1  Moving slowly or fidgety/restless - 0  Suicidal thoughts - 0  PHQ-9 Score - 6   Review of Systems  Constitutional: Negative.   HENT: Negative.   Eyes: Negative.   Respiratory: Negative.   Cardiovascular: Negative.   Gastrointestinal: Negative.   Endocrine: Negative.   Genitourinary: Negative.   Musculoskeletal: Positive for gait problem.  Skin: Negative.   Allergic/Immunologic: Negative.   Neurological: Positive for speech difficulty.  Hematological: Negative.   Psychiatric/Behavioral: Positive for dysphoric mood.  All other systems reviewed and are negative.      Objective:   Physical Exam Gen: no distress, normal appearing HEENT: oral mucosa pink and moist, NCAT Cardio: Reg rate Chest: normal effort, normal rate of breathing Abd: soft, non-distended Ext: no edema Psych: pleasant, normal affect Skin: intact Neuro: Alert  Musculoskeletal: left side decreased strength, 2/5 wrist extension, 0/5 PF. MAS 1 in left EF, WF, KF     Assessment & Plan:  [name] is a 65 year old man who presents for hospital follow-up after CIR admission for debility.   1) Debility: -given loss of balance, weakness, and spasticity, he would benefit from refreshed of PT and  OT- orders placed.  -continue daily walk and incorporate daily resistance training- discussed squats and pushups.   2) post-stroke spasticity: -emphasized the importance of daily stretching -baclofen 5mg  TID prescribed Botox 200U to left arm and knee flexors (biceps, flexor carpi ulnaris and radialis, and gastroc and soleus) in September.   3) post-stroke weakness -Continue left wrist WHO and left AFO.   3) HTN: -discussed that baclofen will also help lower BP.

## 2021-05-30 NOTE — Patient Instructions (Signed)
HTN: -BP is ___today.  -Advised checking BP daily at home and logging results to bring into follow-up appointment with her PCP and myself. -Reviewed BP meds today.  -Advised regarding healthy foods that can help lower blood pressure and provided with a list: 1) citrus foods- high in vitamins and minerals 2) salmon and other fatty fish - reduces inflammation and oxylipins 3) swiss chard (leafy green)- high level of nitrates 4) pumpkin seeds- one of the best natural sources of magnesium 5) Beans and lentils- high in fiber, magnesium, and potassium 6) Berries- high in flavonoids 7) Amaranth (whole grain, can be cooked similarly to rice and oats)- high in magnesium and fiber 8) Pistachios- even more effective at reducing BP than other nuts 9) Carrots- high in phenolic compounds that relax blood vessels and reduce inflammation 10) Celery- contain phthalides that relax tissues of arterial walls 11) Tomatoes- can also improve cholesterol and reduce risk of heart disease 12) Broccoli- good source of magnesium, calcium, and potassium 13) Greek yogurt: high in potassium and calcium 14) Herbs and spices: Celery seed, cilantro, saffron, lemongrass, black cumin, ginseng, cinnamon, cardamom, sweet basil, and ginger 15) Chia and flax seeds- also help to lower cholesterol and blood sugar 16) Beets- high levels of nitrates that relax blood vessels  17) spinach and bananas- high in potassium  -Provided lise of supplements that can help with hypertension:  1) magnesium: one high quality brand is Bioptemizers since it contains all 7 types of magnesium, otherwise over the counter magnesium gluconate 400mg is a good option 2) B vitamins 3) vitamin D 4) potassium 5) CoQ10 6) L-arginine 7) Vitamin C 8) Beetroot -Educated that goal BP is 120/80. -Made goal to incorporate some of the above foods into diet.   

## 2021-06-22 ENCOUNTER — Telehealth: Payer: Self-pay | Admitting: Physical Medicine and Rehabilitation

## 2021-06-22 ENCOUNTER — Other Ambulatory Visit: Payer: Self-pay | Admitting: Physical Medicine and Rehabilitation

## 2021-06-22 DIAGNOSIS — I69398 Other sequelae of cerebral infarction: Secondary | ICD-10-CM

## 2021-06-22 DIAGNOSIS — R252 Cramp and spasm: Secondary | ICD-10-CM

## 2021-06-22 NOTE — Telephone Encounter (Signed)
When patient was in our office last Dr. Richmond Campbell note says that patient should have therapy.  I didn't see a referral for outpatient therapy.  If the referral's can be put in, I will go ahead and sent it over to our outpatient neuro clinic.  Thank you.

## 2021-06-29 ENCOUNTER — Emergency Department (HOSPITAL_COMMUNITY): Payer: Medicare Other

## 2021-06-29 ENCOUNTER — Other Ambulatory Visit: Payer: Self-pay

## 2021-06-29 ENCOUNTER — Emergency Department (HOSPITAL_COMMUNITY)
Admission: EM | Admit: 2021-06-29 | Discharge: 2021-06-29 | Disposition: A | Discharge status: Home / Self Care | Payer: Medicare Other | Attending: Emergency Medicine | Admitting: Emergency Medicine

## 2021-06-29 DIAGNOSIS — R1110 Vomiting, unspecified: Secondary | ICD-10-CM | POA: Diagnosis not present

## 2021-06-29 DIAGNOSIS — K3184 Gastroparesis: Secondary | ICD-10-CM | POA: Diagnosis not present

## 2021-06-29 DIAGNOSIS — R0989 Other specified symptoms and signs involving the circulatory and respiratory systems: Secondary | ICD-10-CM | POA: Insufficient documentation

## 2021-06-29 DIAGNOSIS — E1143 Type 2 diabetes mellitus with diabetic autonomic (poly)neuropathy: Secondary | ICD-10-CM | POA: Insufficient documentation

## 2021-06-29 DIAGNOSIS — Z79899 Other long term (current) drug therapy: Secondary | ICD-10-CM | POA: Insufficient documentation

## 2021-06-29 DIAGNOSIS — E039 Hypothyroidism, unspecified: Secondary | ICD-10-CM | POA: Diagnosis not present

## 2021-06-29 DIAGNOSIS — I10 Essential (primary) hypertension: Secondary | ICD-10-CM | POA: Diagnosis not present

## 2021-06-29 NOTE — ED Notes (Signed)
Patient ambulatory to restroom at this time. 

## 2021-06-29 NOTE — Discharge Instructions (Addendum)
Please remember that [name] should only be eating and drinking fluid and soft foods.    His xray looked normal today, and he was able to drink in the ER.

## 2021-06-29 NOTE — ED Provider Notes (Signed)
[facility] EMERGENCY DEPARTMENT Provider Note   CSN: [id number] Arrival date & time: 06/29/21  1344     History Chief Complaint  Patient presents with   Choking    [name] is a 65 y.o. male with a history of intellectual disability, dysphagia, gastroparesis, Barrett's esophagus, presented ED with a choking episode.  Patient reportedly choked on a peanut butter sandwich at lunch.  He reports that he vomited the pills up.  He says that afterwards he has been able to swallow and drink normally.  He has no discomfort at this time.  HPI     Past Medical History:  Diagnosis Date   Barrett esophagus    DM II (diabetes mellitus, type II), controlled (HCC) 11/13/2020   Dysphagia    Gastroparesis    GERD (gastroesophageal reflux disease)    Hypercholesteremia    Hypertension    Iron deficiency anemia    Mental retardation    Schizophrenia (HCC)    Seizure (HCC)    Stroke [facility])     Patient Active Problem List   Diagnosis Date Noted   S/P craniotomy 03/09/2021   Dilation of esophagus    Hyponatremia    Dysphagia    Seizures (HCC)    S/P percutaneous endoscopic gastrostomy (PEG) tube placement (HCC)    Debility 11/15/2020   DM II (diabetes mellitus, type II), controlled (HCC) 11/13/2020   Bowel obstruction (HCC)    S/P repair of paraesophageal hernia 10/26/2020   Syncope 10/21/2020   Hiatal hernia 10/21/2020   Acute hypoxemic respiratory failure (HCC) 10/21/2020   Sinus tachycardia 10/21/2020   Gastric polyps    Altered mental status 09/05/2020   Emesis 07/24/2015   Coffee ground emesis    Gastrointestinal hemorrhage associated with other gastritis    Acute esophagitis 01/09/2015   Upper gastrointestinal bleed    Diarrhea    Nausea with vomiting    GI bleed 01/08/2015   GIB (gastrointestinal bleeding) 01/08/2015   Nausea & vomiting 01/08/2015   Reflux esophagitis 07/04/2009   Loss of weight 07/04/2009   BLOOD IN STOOL, OCCULT  07/04/2009   ANEMIA, IRON DEFICIENCY 05/04/2008   Hypothyroidism 04/05/2008   Schizophrenia, unspecified type (HCC) 04/05/2008   Depression 04/05/2008   Hemiplegia (HCC) 04/05/2008   Essential hypertension 04/05/2008   GERD 04/05/2008   Gastroparesis 04/05/2008   Seizure (HCC) 04/05/2008   History of diverticulosis 08/21/2004   Barrett's esophagus 04/01/2002   HIATAL HERNIA 04/01/2002    Past Surgical History:  Procedure Laterality Date   BIOPSY  10/21/2020   Procedure: BIOPSY;  Surgeon: Iva Boop, MD;  Location: [facility] ENDOSCOPY;  Service: Endoscopy;;   CRANIOTOMY FOR CYST FENESTRATION     ESOPHAGOGASTRODUODENOSCOPY N/A 01/09/2015   Procedure: ESOPHAGOGASTRODUODENOSCOPY (EGD);  Surgeon: Louis Meckel, MD;  Location: [facility] ENDOSCOPY;  Service: Endoscopy;  Laterality: N/A;   ESOPHAGOGASTRODUODENOSCOPY N/A 10/26/2020   Procedure: ESOPHAGOGASTRODUODENOSCOPY (EGD);  Surgeon: Corliss Skains, MD;  Location: [facility] OR;  Service: Thoracic;  Laterality: N/A;   ESOPHAGOGASTRODUODENOSCOPY (EGD) WITH PROPOFOL N/A 10/21/2020   Procedure: ESOPHAGOGASTRODUODENOSCOPY (EGD) WITH PROPOFOL;  Surgeon: Iva Boop, MD;  Location: [facility] ENDOSCOPY;  Service: Endoscopy;  Laterality: N/A;   IR GASTR TUBE CONVERT GASTR-JEJ PER W/FL MOD SED  11/07/2020   PEG PLACEMENT N/A 10/26/2020   Procedure: PERCUTANEOUS ENDOSCOPIC GASTROSTOMY (PEG) PLACEMENT;  Surgeon: Corliss Skains, MD;  Location: [facility] OR;  Service: Thoracic;  Laterality: N/A;   XI ROBOTIC ASSISTED HIATAL HERNIA REPAIR N/A 10/26/2020  Procedure: XI ROBOTIC ASSISTED HIATAL HERNIA REPAIR;  Surgeon: Corliss Skains, MD;  Location: [facility] OR;  Service: Thoracic;  Laterality: N/A;       Family History  Problem Relation Age of Onset   Breast cancer Mother    Heart disease Father     Social History   Tobacco Use   Smoking status: Never   Smokeless tobacco: Never  Vaping Use   Vaping Use: Never used  Substance Use Topics   Alcohol use: No    Drug use: Never    Home Medications Prior to Admission medications   Medication Sig Start Date End Date Taking? Authorizing Provider  Baclofen 5 MG TABS Take 1 tablet by mouth in the morning, at noon, and at bedtime. 05/30/21   Raulkar, Drema Pry, MD  Cholecalciferol (VITAMIN D) 50 MCG (2000 UT) tablet Take 1 tablet (2,000 Units total) by mouth daily. 12/06/20   Angiulli, Mcarthur Rossetti, PA-C  divalproex (DEPAKOTE) 500 MG DR tablet Take 1 tablet (500 mg total) by mouth every 12 (twelve) hours. 12/07/20   Angiulli, Mcarthur Rossetti, PA-C  docusate sodium (COLACE) 100 MG capsule Take 1 capsule (100 mg total) by mouth 2 (two) times daily. 12/06/20   Angiulli, Mcarthur Rossetti, PA-C  Emollient (CORN HUSKERS EX) Apply 1 application topically in the morning and at bedtime. For dry skin    [provider]  lamoTRIgine (LAMICTAL) 100 MG tablet Take 1 tablet (100 mg total) by mouth daily. 12/07/20   Angiulli, Mcarthur Rossetti, PA-C  levothyroxine (SYNTHROID) 75 MCG tablet Take 1 tablet (75 mcg total) by mouth daily at 6 (six) AM. 12/07/20   Angiulli, Mcarthur Rossetti, PA-C  metoprolol tartrate (LOPRESSOR) 25 MG tablet Take 1 tablet (25 mg total) by mouth 2 (two) times daily. 12/07/20   Angiulli, Mcarthur Rossetti, PA-C  ondansetron (ZOFRAN) 4 MG tablet Take 4 mg by mouth 4 (four) times daily as needed for nausea or vomiting.    [provider]  pantoprazole (PROTONIX) 40 MG tablet Take 1 tablet (40 mg total) by mouth 2 (two) times daily before a meal. 12/06/20   Angiulli, Mcarthur Rossetti, PA-C  polyethylene glycol (MIRALAX / GLYCOLAX) 17 g packet Take 17 g by mouth 2 (two) times daily. 12/06/20   Angiulli, Mcarthur Rossetti, PA-C  QUEtiapine (SEROQUEL) 400 MG tablet Take 1 tablet (400 mg total) by mouth 2 (two) times daily. 12/06/20   Angiulli, Mcarthur Rossetti, PA-C  rosuvastatin (CRESTOR) 20 MG tablet Take 1 tablet (20 mg total) by mouth daily. 12/07/20   Angiulli, Mcarthur Rossetti, PA-C    Allergies    Vancomycin  Review of Systems   Review of Systems   Constitutional:  Negative for chills and fever.  Cardiovascular:  Negative for chest pain and palpitations.  Gastrointestinal:  Negative for abdominal pain and vomiting.  Musculoskeletal:  Negative for arthralgias and back pain.  Skin:  Negative for color change and rash.  All other systems reviewed and are negative.  Physical Exam Updated Vital Signs BP (!) 157/100 (BP Location: Left Arm)   Pulse 73   Temp 97.7 F (36.5 C) (Oral)   Resp 18   Ht 5\' 4"  (1.626 m)   Wt 72.4 kg   SpO2 97%   BMI 27.40 kg/m   Physical Exam Constitutional:      General: He is not in acute distress. HENT:     Head: Normocephalic and atraumatic.  Eyes:     Conjunctiva/sclera: Conjunctivae normal.     Pupils: Pupils  are equal, round, and reactive to light.  Cardiovascular:     Rate and Rhythm: Normal rate and regular rhythm.  Pulmonary:     Effort: Pulmonary effort is normal. No respiratory distress.  Abdominal:     General: There is no distension.     Tenderness: There is no abdominal tenderness.  Skin:    General: Skin is warm and dry.  Neurological:     General: No focal deficit present.     Mental Status: He is alert. Mental status is at baseline.  Psychiatric:        Mood and Affect: Mood normal.        Behavior: Behavior normal.    ED Results / Procedures / Treatments   Labs (all labs ordered are listed, but only abnormal results are displayed) Labs Reviewed - No data to display  EKG None  Radiology DG Chest 2 View  Result Date: 06/29/2021 CLINICAL DATA:  Food choking event EXAM: CHEST - 2 VIEW COMPARISON:  04/06/2021 FINDINGS: Lung volumes are low. Patient arm positioning interferes with evaluation of posterior lung bases on lateral views. No definite evidence of aspiration. No pleural effusion or pneumothorax. Stable cardiomediastinal contours. IMPRESSION: No definite evidence of aspiration. Electronically Signed   By: Guadlupe Spanish M.D.   On: 06/29/2021 15:04     Procedures Procedures   Medications Ordered in ED Medications - No data to display  ED Course  I have reviewed the triage vital signs and the nursing notes.  Pertinent labs & imaging results that were available during my care of the patient were reviewed by me and considered in my medical decision making (see chart for details).  Patient is here after choking event at home.  His airway is intact.  He is swallowing comfortably.  He is not choking on his secretions or drooling.  We will get an x-ray to evaluate for aspiration.  We will also p.o. challenge him with fluids and food.  If he can keep this down I suspect we can discharge him   Clinical Course as of 06/29/21 1647  Thu Jun 29, 2021  1423 I spoke to the patient's sister by phone who is the power of attorney.  She reports that she is not in town but did give [place] the phone number to authorize [place] to speak to Kyung Bacca his group home director at [phone] regarding results and workup.  His sister reports that the patient had a medical procedure done several months ago with his GI doctor, was advised to stick to a soft diet only, but the group home mistakenly gave him a peanut butter sandwich today, which he choked on. [MT]  1514 Tolerated PO - no clear evidence of aspiration, okay for discharge [MT]  1518 I notified christy white about his results, okay for discharge.  She is here at bedside [MT]    Clinical Course User Index [MT] Chianna Spirito, Kermit Balo, MD    Final Clinical Impression(s) / ED Diagnoses Final diagnoses:  Choking episode    Rx / DC Orders ED Discharge Orders     None        Terald Sleeper, MD 06/29/21 [phone]

## 2021-06-29 NOTE — ED Notes (Signed)
Patient tolerating fluids along with applesauce able to swallow without difficulty. Notified Dr. Renaye Rakers.

## 2021-06-29 NOTE — ED Triage Notes (Signed)
[facility] BIB [name] from [facility] [facility].  Patient choked on a peanut butter sandwhich while eating lunch and got choked up slightly.Family requested he be evaluated further for precautions. Pt denies any pain at this time or complaints.   BP- 133/74 HR-71 Spo2-96% Resp-16

## 2021-07-04 ENCOUNTER — Ambulatory Visit: Payer: Medicare Other | Admitting: Occupational Therapy

## 2021-07-04 ENCOUNTER — Ambulatory Visit: Payer: Medicare Other | Attending: Family Medicine | Admitting: Physical Therapy

## 2021-07-10 ENCOUNTER — Telehealth: Payer: Self-pay | Admitting: Physical Medicine and Rehabilitation

## 2021-07-10 NOTE — Telephone Encounter (Signed)
Patients sister [name] called [phone]-- she was not involved when he left PT OT - she needs some kind of documentation of how she can help him exercise - strengthening =stretching.... can you help get her something?? She will attend the next appt also.

## 2021-07-10 NOTE — Telephone Encounter (Signed)
[name] [phone] called regarding botox - explained spasticity. Wants copy of exercises for strengthing

## 2021-08-30 ENCOUNTER — Emergency Department (HOSPITAL_COMMUNITY)
Admission: EM | Admit: 2021-08-30 | Discharge: 2021-08-30 | Disposition: A | Discharge status: Home / Self Care | Payer: Medicare Other | Attending: Emergency Medicine | Admitting: Emergency Medicine

## 2021-08-30 ENCOUNTER — Other Ambulatory Visit: Payer: Self-pay

## 2021-08-30 ENCOUNTER — Encounter (HOSPITAL_COMMUNITY): Payer: Self-pay | Admitting: Oncology

## 2021-08-30 DIAGNOSIS — Z79899 Other long term (current) drug therapy: Secondary | ICD-10-CM | POA: Diagnosis not present

## 2021-08-30 DIAGNOSIS — R339 Retention of urine, unspecified: Secondary | ICD-10-CM | POA: Diagnosis present

## 2021-08-30 DIAGNOSIS — E039 Hypothyroidism, unspecified: Secondary | ICD-10-CM | POA: Insufficient documentation

## 2021-08-30 DIAGNOSIS — R531 Weakness: Secondary | ICD-10-CM | POA: Diagnosis not present

## 2021-08-30 DIAGNOSIS — I10 Essential (primary) hypertension: Secondary | ICD-10-CM | POA: Insufficient documentation

## 2021-08-30 DIAGNOSIS — E119 Type 2 diabetes mellitus without complications: Secondary | ICD-10-CM | POA: Diagnosis not present

## 2021-08-30 DIAGNOSIS — R0989 Other specified symptoms and signs involving the circulatory and respiratory systems: Secondary | ICD-10-CM | POA: Diagnosis not present

## 2021-08-30 LAB — I-STAT CHEM 8, ED
BUN: 11 mg/dL (ref 8–23)
Calcium, Ion: 1.13 mmol/L — ABNORMAL LOW (ref 1.15–1.40)
Chloride: 102 mmol/L (ref 98–111)
Creatinine, Ser: 1.1 mg/dL (ref 0.61–1.24)
Glucose, Bld: 86 mg/dL (ref 70–99)
HCT: 39 % (ref 39.0–52.0)
Hemoglobin: 13.3 g/dL (ref 13.0–17.0)
Potassium: 3.7 mmol/L (ref 3.5–5.1)
Sodium: 141 mmol/L (ref 135–145)
TCO2: 29 mmol/L (ref 22–32)

## 2021-08-30 LAB — URINALYSIS, ROUTINE W REFLEX MICROSCOPIC
Bacteria, UA: NONE SEEN
Bilirubin Urine: NEGATIVE
Glucose, UA: NEGATIVE mg/dL
Ketones, ur: 5 mg/dL — AB
Leukocytes,Ua: NEGATIVE
Nitrite: NEGATIVE
Protein, ur: NEGATIVE mg/dL
Specific Gravity, Urine: 1.019 (ref 1.005–1.030)
pH: 7 (ref 5.0–8.0)

## 2021-08-30 LAB — BASIC METABOLIC PANEL
Anion gap: 10 (ref 5–15)
BUN: 14 mg/dL (ref 8–23)
CO2: 25 mmol/L (ref 22–32)
Calcium: 9.1 mg/dL (ref 8.9–10.3)
Chloride: 102 mmol/L (ref 98–111)
Creatinine, Ser: 1.21 mg/dL (ref 0.61–1.24)
GFR, Estimated: >60 mL/min (ref >60)
Glucose, Bld: 78 mg/dL (ref 70–99)
Potassium: 4.6 mmol/L (ref 3.5–5.1)
Sodium: 137 mmol/L (ref 135–145)

## 2021-08-30 MED ORDER — SODIUM CHLORIDE 0.9 % IV BOLUS
1000.0000 mL | Freq: Once | INTRAVENOUS | Status: AC
  Administered 2021-08-30: 1000 mL via INTRAVENOUS

## 2021-08-30 NOTE — Discharge Instructions (Addendum)
Please call and follow up closely with your primary care provider for further management of your health.  Return if you have any concerns.

## 2021-08-30 NOTE — ED Provider Notes (Signed)
[facility] Provider Note   CSN: [id number] Arrival date & time: 08/30/21  1408     History Chief Complaint  Patient presents with   Urinary Retention    [name] is a 65 y.o. male.  The history is provided by the patient, the EMS personnel, a relative and medical records. No language interpreter was used.   65 year old male significant history of mental retardation, schizophrenia, prior seizures, strokes with hemiplegia, diabetes, hypertension, brought here via EMS from a group home due to concerns of urinary retention.  It is difficult to obtain history from patient therefore additional history was obtained through his sister who is his power of attorney.  Per EMS, patient was having trouble with urinary retention for approximately 1week.  It was reported that he was able to urinate but feels like he cannot fully empty his bladder.  No other complaint.  Also some report of weakness to his left lower extremity for least a month.  This is all started after he had a brain tumor removed many years ago.  Per sister, patient had hiatal hernia requiring surgical repair that leads to him being hospitalized for several months earlier this year.  Due to prolonged hospital stay he was deconditioned and subsequently have to use a walker to ambulate.  Furthermore patient without any complaints of fever chills no complaints of nausea vomiting or diarrhea no complaints of chest pain shortness of breath or abdominal pain or back pain.  However history is limited due to it being difficult to fully understand when patient talks.    Past Medical History:  Diagnosis Date   Barrett esophagus    DM II (diabetes mellitus, type II), controlled (HCC) 11/13/2020   Dysphagia    Gastroparesis    GERD (gastroesophageal reflux disease)    Hypercholesteremia    Hypertension    Iron deficiency anemia    Mental retardation    Schizophrenia (HCC)    Seizure (HCC)     Stroke [facility])     Patient Active Problem List   Diagnosis Date Noted   S/P craniotomy 03/09/2021   Dilation of esophagus    Hyponatremia    Dysphagia    Seizures (HCC)    S/P percutaneous endoscopic gastrostomy (PEG) tube placement (HCC)    Debility 11/15/2020   DM II (diabetes mellitus, type II), controlled (HCC) 11/13/2020   Bowel obstruction (HCC)    S/P repair of paraesophageal hernia 10/26/2020   Syncope 10/21/2020   Hiatal hernia 10/21/2020   Acute hypoxemic respiratory failure (HCC) 10/21/2020   Sinus tachycardia 10/21/2020   Gastric polyps    Altered mental status 09/05/2020   Emesis 07/24/2015   Coffee ground emesis    Gastrointestinal hemorrhage associated with other gastritis    Acute esophagitis 01/09/2015   Upper gastrointestinal bleed    Diarrhea    Nausea with vomiting    GI bleed 01/08/2015   GIB (gastrointestinal bleeding) 01/08/2015   Nausea & vomiting 01/08/2015   Reflux esophagitis 07/04/2009   Loss of weight 07/04/2009   BLOOD IN STOOL, OCCULT 07/04/2009   ANEMIA, IRON DEFICIENCY 05/04/2008   Hypothyroidism 04/05/2008   Schizophrenia, unspecified type (HCC) 04/05/2008   Depression 04/05/2008   Hemiplegia (HCC) 04/05/2008   Essential hypertension 04/05/2008   GERD 04/05/2008   Gastroparesis 04/05/2008   Seizure (HCC) 04/05/2008   History of diverticulosis 08/21/2004   Barrett's esophagus 04/01/2002   HIATAL HERNIA 04/01/2002    Past Surgical History:  Procedure Laterality Date  BIOPSY  10/21/2020   Procedure: BIOPSY;  Surgeon: Iva Boop, MD;  Location: [facility] ENDOSCOPY;  Service: Endoscopy;;   CRANIOTOMY FOR CYST FENESTRATION     ESOPHAGOGASTRODUODENOSCOPY N/A 01/09/2015   Procedure: ESOPHAGOGASTRODUODENOSCOPY (EGD);  Surgeon: Louis Meckel, MD;  Location: [facility] ENDOSCOPY;  Service: Endoscopy;  Laterality: N/A;   ESOPHAGOGASTRODUODENOSCOPY N/A 10/26/2020   Procedure: ESOPHAGOGASTRODUODENOSCOPY (EGD);  Surgeon: Corliss Skains, MD;   Location: [facility] OR;  Service: Thoracic;  Laterality: N/A;   ESOPHAGOGASTRODUODENOSCOPY (EGD) WITH PROPOFOL N/A 10/21/2020   Procedure: ESOPHAGOGASTRODUODENOSCOPY (EGD) WITH PROPOFOL;  Surgeon: Iva Boop, MD;  Location: [facility] ENDOSCOPY;  Service: Endoscopy;  Laterality: N/A;   IR GASTR TUBE CONVERT GASTR-JEJ PER W/FL MOD SED  11/07/2020   PEG PLACEMENT N/A 10/26/2020   Procedure: PERCUTANEOUS ENDOSCOPIC GASTROSTOMY (PEG) PLACEMENT;  Surgeon: Corliss Skains, MD;  Location: [facility] OR;  Service: Thoracic;  Laterality: N/A;   XI ROBOTIC ASSISTED HIATAL HERNIA REPAIR N/A 10/26/2020   Procedure: XI ROBOTIC ASSISTED HIATAL HERNIA REPAIR;  Surgeon: Corliss Skains, MD;  Location: [facility] OR;  Service: Thoracic;  Laterality: N/A;       Family History  Problem Relation Age of Onset   Breast cancer Mother    Heart disease Father     Social History   Tobacco Use   Smoking status: Never   Smokeless tobacco: Never  Vaping Use   Vaping Use: Never used  Substance Use Topics   Alcohol use: No   Drug use: Never    Home Medications Prior to Admission medications   Medication Sig Start Date End Date Taking? Authorizing Provider  Baclofen 5 MG TABS Take 1 tablet by mouth in the morning, at noon, and at bedtime. 05/30/21   Raulkar, Drema Pry, MD  Cholecalciferol (VITAMIN D) 50 MCG (2000 UT) tablet Take 1 tablet (2,000 Units total) by mouth daily. 12/06/20   Angiulli, Mcarthur Rossetti, PA-C  divalproex (DEPAKOTE) 500 MG DR tablet Take 1 tablet (500 mg total) by mouth every 12 (twelve) hours. 12/07/20   Angiulli, Mcarthur Rossetti, PA-C  docusate sodium (COLACE) 100 MG capsule Take 1 capsule (100 mg total) by mouth 2 (two) times daily. 12/06/20   Angiulli, Mcarthur Rossetti, PA-C  Emollient (CORN HUSKERS EX) Apply 1 application topically in the morning and at bedtime. For dry skin    [provider]  lamoTRIgine (LAMICTAL) 100 MG tablet Take 1 tablet (100 mg total) by mouth daily. 12/07/20   Angiulli, Mcarthur Rossetti, PA-C   levothyroxine (SYNTHROID) 75 MCG tablet Take 1 tablet (75 mcg total) by mouth daily at 6 (six) AM. 12/07/20   Angiulli, Mcarthur Rossetti, PA-C  metoprolol tartrate (LOPRESSOR) 25 MG tablet Take 1 tablet (25 mg total) by mouth 2 (two) times daily. 12/07/20   Angiulli, Mcarthur Rossetti, PA-C  ondansetron (ZOFRAN) 4 MG tablet Take 4 mg by mouth 4 (four) times daily as needed for nausea or vomiting.    [provider]  pantoprazole (PROTONIX) 40 MG tablet Take 1 tablet (40 mg total) by mouth 2 (two) times daily before a meal. 12/06/20   Angiulli, Mcarthur Rossetti, PA-C  polyethylene glycol (MIRALAX / GLYCOLAX) 17 g packet Take 17 g by mouth 2 (two) times daily. 12/06/20   Angiulli, Mcarthur Rossetti, PA-C  QUEtiapine (SEROQUEL) 400 MG tablet Take 1 tablet (400 mg total) by mouth 2 (two) times daily. 12/06/20   Angiulli, Mcarthur Rossetti, PA-C  rosuvastatin (CRESTOR) 20 MG tablet Take 1 tablet (20 mg total) by mouth daily. 12/07/20  Angiulli, Mcarthur Rossettianiel J, PA-C    Allergies    Vancomycin  Review of Systems   Review of Systems  Unable to perform ROS: Other   Physical Exam Updated Vital Signs BP 127/72 (BP Location: Right Arm)   Pulse 64   Temp 98.3 F (36.8 C) (Oral)   Resp (!) 23   SpO2 94%   Physical Exam Vitals and nursing note reviewed.  Constitutional:      General: He is not in acute distress.    Appearance: He is well-developed.     Comments: Patient is resting comfortably in no acute discomfort, speech is very difficult to understand.  HENT:     Head: Normocephalic and atraumatic.  Eyes:     Extraocular Movements: Extraocular movements intact.     Conjunctiva/sclera: Conjunctivae normal.     Pupils: Pupils are equal, round, and reactive to light.  Cardiovascular:     Rate and Rhythm: Normal rate and regular rhythm.     Pulses: Normal pulses.     Heart sounds: Normal heart sounds.  Pulmonary:     Breath sounds: Rales (Faint crackles heard at left lower lung base) present.  Abdominal:     Palpations:  Abdomen is soft.     Tenderness: There is no abdominal tenderness.     Comments: Abdomen nondistended and nontender  Musculoskeletal:     Cervical back: Neck supple.     Comments: Patient wearing left leg brace with decrease in strength to left lower extremity compared to right.  Equal upper extremities bilaterally  Skin:    Findings: No rash.  Neurological:     Mental Status: He is alert. Mental status is at baseline.    ED Results / Procedures / Treatments   Labs (all labs ordered are listed, but only abnormal results are displayed) Labs Reviewed  URINALYSIS, ROUTINE W REFLEX MICROSCOPIC - Abnormal; Notable for the following components:      Result Value   Hgb urine dipstick SMALL (*)    Ketones, ur 5 (*)    All other components within normal limits  I-STAT CHEM 8, ED - Abnormal; Notable for the following components:   Calcium, Ion 1.13 (*)    All other components within normal limits  BASIC METABOLIC PANEL  CBC WITH DIFFERENTIAL/PLATELET  CBC WITH DIFFERENTIAL/PLATELET  CBC WITH DIFFERENTIAL/PLATELET    EKG None  Radiology No results found.  Procedures Procedures   Medications Ordered in ED Medications  sodium chloride 0.9 % bolus 1,000 mL (0 mLs Intravenous Stopped 08/30/21 2118)    ED Course  I have reviewed the triage vital signs and the nursing notes.  Pertinent labs & imaging results that were available during my care of the patient were reviewed by me and considered in my medical decision making (see chart for details).    MDM Rules/Calculators/A&P                           BP 120/72   Pulse 61   Temp 98.3 F (36.8 C) (Oral)   Resp (!) 29   SpO2 97%   Final Clinical Impression(s) / ED Diagnoses Final diagnoses:  General weakness    Rx / DC Orders ED Discharge Orders     None      3:45 PM Patient sent here for evaluation of urinary retention for unknown amount of time.  He does not appear to have any lower abdominal discomfort to suggest  acute urinary retention.  We will obtain bladder scan.  Patient does complain of some left-leg weakness started today and he nearly fell.  Does have history of prior stroke.  Upon further history obtained through sister, patient had an EGD procedure.  Yesterday and was found to have Barrett's esophagus which he had addressed.  And today he complains of weakness to the right side of his body as well as discomfort to his left foot and bilateral knees weakness.  He also has urge to urinate and feel incomplete when he urinates.  He was initially evaluated by his PCP today but they were having difficulty obtaining UA as well as blood work and therefore patient sent here for further evaluation.  I do not appreciate any focal weakness on my exam.  I have low suspicion for acute stroke causing his symptoms.  9:49 PM Edition does not have any evidence of retained urine.  Urinalysis without signs of urine tract infection.  Labs are reassuring without any electrolyte derangement.  Patient able to ambulate.  At this time is stable for discharge with outpatient follow-up.  Return precaution given.  Care discussed with Dr. Particia Nearing and with his POA who is in the room.     Fayrene Helper, PA-C 08/30/21 2150    Jacalyn Lefevre, MD 08/30/21 2158

## 2021-08-30 NOTE — ED Triage Notes (Signed)
Pt bib [facility] from home d/t urinary retention x 1 week.  Pt was seen by unspecified provider who directed him to present to ED.  Pt also c/o generalized weakness.

## 2021-09-14 ENCOUNTER — Encounter: Payer: Self-pay | Admitting: Neurology

## 2021-09-14 ENCOUNTER — Ambulatory Visit: Payer: Medicare Other | Admitting: Neurology

## 2021-09-15 ENCOUNTER — Emergency Department (HOSPITAL_COMMUNITY)
Admission: EM | Admit: 2021-09-15 | Discharge: 2021-09-16 | Disposition: A | Discharge status: Home / Self Care | Payer: Medicare Other | Attending: Emergency Medicine | Admitting: Emergency Medicine

## 2021-09-15 ENCOUNTER — Emergency Department (HOSPITAL_COMMUNITY): Payer: Medicare Other

## 2021-09-15 ENCOUNTER — Encounter (HOSPITAL_COMMUNITY): Payer: Self-pay

## 2021-09-15 ENCOUNTER — Other Ambulatory Visit: Payer: Self-pay

## 2021-09-15 DIAGNOSIS — I10 Essential (primary) hypertension: Secondary | ICD-10-CM | POA: Diagnosis not present

## 2021-09-15 DIAGNOSIS — E039 Hypothyroidism, unspecified: Secondary | ICD-10-CM | POA: Insufficient documentation

## 2021-09-15 DIAGNOSIS — W050XXA Fall from non-moving wheelchair, initial encounter: Secondary | ICD-10-CM | POA: Diagnosis not present

## 2021-09-15 DIAGNOSIS — Z79899 Other long term (current) drug therapy: Secondary | ICD-10-CM | POA: Insufficient documentation

## 2021-09-15 DIAGNOSIS — R531 Weakness: Secondary | ICD-10-CM | POA: Diagnosis not present

## 2021-09-15 DIAGNOSIS — E119 Type 2 diabetes mellitus without complications: Secondary | ICD-10-CM | POA: Insufficient documentation

## 2021-09-15 DIAGNOSIS — S40012A Contusion of left shoulder, initial encounter: Secondary | ICD-10-CM | POA: Insufficient documentation

## 2021-09-15 DIAGNOSIS — Y9301 Activity, walking, marching and hiking: Secondary | ICD-10-CM | POA: Insufficient documentation

## 2021-09-15 DIAGNOSIS — W19XXXA Unspecified fall, initial encounter: Secondary | ICD-10-CM

## 2021-09-15 LAB — CBC WITH DIFFERENTIAL/PLATELET
Abs Immature Granulocytes: 0.01 10*3/uL (ref 0.00–0.07)
Basophils Absolute: 0 10*3/uL (ref 0.0–0.1)
Basophils Relative: 1 %
Eosinophils Absolute: 0.1 10*3/uL (ref 0.0–0.5)
Eosinophils Relative: 2 %
HCT: 47.3 % (ref 39.0–52.0)
Hemoglobin: 16.7 g/dL (ref 13.0–17.0)
Immature Granulocytes: 0 %
Lymphocytes Relative: 45 %
Lymphs Abs: 1.5 10*3/uL (ref 0.7–4.0)
MCH: 31.9 pg (ref 26.0–34.0)
MCHC: 35.3 g/dL (ref 30.0–36.0)
MCV: 90.4 fL (ref 80.0–100.0)
Monocytes Absolute: 0.3 10*3/uL (ref 0.1–1.0)
Monocytes Relative: 8 %
Neutro Abs: 1.5 10*3/uL — ABNORMAL LOW (ref 1.7–7.7)
Neutrophils Relative %: 44 %
Platelets: 124 10*3/uL — ABNORMAL LOW (ref 150–400)
RBC: 5.23 MIL/uL (ref 4.22–5.81)
RDW: 11.8 % (ref 11.5–15.5)
WBC: 3.3 10*3/uL — ABNORMAL LOW (ref 4.0–10.5)
nRBC: 0 % (ref 0.0–0.2)

## 2021-09-15 LAB — URINALYSIS, ROUTINE W REFLEX MICROSCOPIC
Bilirubin Urine: NEGATIVE
Glucose, UA: NEGATIVE mg/dL
Hgb urine dipstick: NEGATIVE
Ketones, ur: NEGATIVE mg/dL
Leukocytes,Ua: NEGATIVE
Nitrite: NEGATIVE
Protein, ur: NEGATIVE mg/dL
Specific Gravity, Urine: 1.008 (ref 1.005–1.030)
pH: 7 (ref 5.0–8.0)

## 2021-09-15 LAB — I-STAT CHEM 8, ED
BUN: 8 mg/dL (ref 8–23)
Calcium, Ion: 1.13 mmol/L — ABNORMAL LOW (ref 1.15–1.40)
Chloride: 96 mmol/L — ABNORMAL LOW (ref 98–111)
Creatinine, Ser: 1.1 mg/dL (ref 0.61–1.24)
Glucose, Bld: 94 mg/dL (ref 70–99)
HCT: 50 % (ref 39.0–52.0)
Hemoglobin: 17 g/dL (ref 13.0–17.0)
Potassium: 4.3 mmol/L (ref 3.5–5.1)
Sodium: 137 mmol/L (ref 135–145)
TCO2: 29 mmol/L (ref 22–32)

## 2021-09-15 MED ORDER — LORAZEPAM 2 MG/ML IJ SOLN
1.0000 mg | Freq: Once | INTRAMUSCULAR | Status: AC
  Administered 2021-09-15: 1 mg via INTRAVENOUS
  Filled 2021-09-15: qty 1

## 2021-09-15 NOTE — ED Provider Notes (Signed)
Emergency Medicine Provider Triage Evaluation Note  [name] , a 65 y.o. male  was evaluated in triage.  Pt complains of left-sided weakness and pain.  Patient was sent from his group home, they state that he he has seemed weaker than his baseline.  He has a history of stroke with residual left-sided weakness.  Facility reports that he almost fell today, however patient states that he did have a fall.  He denies any head injury.  He also complains of left-sided shoulder pain which is chronic.  Denies any blurry vision, difficulty speaking, headache, numbness, tingling. Pt denies any new complaints   Review of Systems  Positive: As above Negative: As above  Physical Exam  BP 111/86 (BP Location: Right Arm)   Pulse (!) 58   Temp 97.6 F (36.4 C)   Resp 14   SpO2 99%  Gen:   Awake, no distress   Resp:  Normal effort  MSK:   Slightly restricted range of motion of the left shoulder secondary to pain.  4/5 grip strength in left upper extremity, 5/5 in right.  5/5 strength in lower extremities bilaterally.  Negative pronator drift, cranial nerves intact. Other:    Medical Decision Making  Medically screening exam initiated at 12:46 PM.  Appropriate orders placed.  [name] was informed that the remainder of the evaluation will be completed by another provider, this initial triage assessment does not replace that evaluation, and the importance of remaining in the ED until their evaluation is complete.     Mare Ferrari, PA-C 09/23/21 0302    Franne Forts, DO 10/06/21 1255

## 2021-09-15 NOTE — ED Notes (Signed)
EDP at bedside for US IV 

## 2021-09-15 NOTE — ED Provider Notes (Signed)
[facility] EMERGENCY DEPARTMENT Provider Note   CSN: [id number] Arrival date & time: 09/15/21  1241     History Chief Complaint  Patient presents with   [facility] is a 65 y.o. male hx of DM, GERD, HTN,previous stroke, encephalomalacia here with fall and weakness.  Patient is currently from a group home.  Patient has a history of stroke with left-sided weakness.  Patient was noted to be weaker than usual.  He apparently had a fall and has left shoulder pain.   The history is provided by the patient.      Past Medical History:  Diagnosis Date   Barrett esophagus    DM II (diabetes mellitus, type II), controlled (HCC) 11/13/2020   Dysphagia    Gastroparesis    GERD (gastroesophageal reflux disease)    Hypercholesteremia    Hypertension    Iron deficiency anemia    Mental retardation    Schizophrenia (HCC)    Seizure (HCC)    Stroke [facility])     Patient Active Problem List   Diagnosis Date Noted   S/P craniotomy 03/09/2021   Dilation of esophagus    Hyponatremia    Dysphagia    Seizures (HCC)    S/P percutaneous endoscopic gastrostomy (PEG) tube placement (HCC)    Debility 11/15/2020   DM II (diabetes mellitus, type II), controlled (HCC) 11/13/2020   Bowel obstruction (HCC)    S/P repair of paraesophageal hernia 10/26/2020   Syncope 10/21/2020   Hiatal hernia 10/21/2020   Acute hypoxemic respiratory failure (HCC) 10/21/2020   Sinus tachycardia 10/21/2020   Gastric polyps    Altered mental status 09/05/2020   Emesis 07/24/2015   Coffee ground emesis    Gastrointestinal hemorrhage associated with other gastritis    Acute esophagitis 01/09/2015   Upper gastrointestinal bleed    Diarrhea    Nausea with vomiting    GI bleed 01/08/2015   GIB (gastrointestinal bleeding) 01/08/2015   Nausea & vomiting 01/08/2015   Reflux esophagitis 07/04/2009   Loss of weight 07/04/2009   BLOOD IN STOOL, OCCULT 07/04/2009   ANEMIA, IRON  DEFICIENCY 05/04/2008   Hypothyroidism 04/05/2008   Schizophrenia, unspecified type (HCC) 04/05/2008   Depression 04/05/2008   Hemiplegia (HCC) 04/05/2008   Essential hypertension 04/05/2008   GERD 04/05/2008   Gastroparesis 04/05/2008   Seizure (HCC) 04/05/2008   History of diverticulosis 08/21/2004   Barrett's esophagus 04/01/2002   HIATAL HERNIA 04/01/2002    Past Surgical History:  Procedure Laterality Date   BIOPSY  10/21/2020   Procedure: BIOPSY;  Surgeon: Iva Boop, MD;  Location: [facility] ENDOSCOPY;  Service: Endoscopy;;   CRANIOTOMY FOR CYST FENESTRATION     ESOPHAGOGASTRODUODENOSCOPY N/A 01/09/2015   Procedure: ESOPHAGOGASTRODUODENOSCOPY (EGD);  Surgeon: Louis Meckel, MD;  Location: [facility] ENDOSCOPY;  Service: Endoscopy;  Laterality: N/A;   ESOPHAGOGASTRODUODENOSCOPY N/A 10/26/2020   Procedure: ESOPHAGOGASTRODUODENOSCOPY (EGD);  Surgeon: Corliss Skains, MD;  Location: [facility] OR;  Service: Thoracic;  Laterality: N/A;   ESOPHAGOGASTRODUODENOSCOPY (EGD) WITH PROPOFOL N/A 10/21/2020   Procedure: ESOPHAGOGASTRODUODENOSCOPY (EGD) WITH PROPOFOL;  Surgeon: Iva Boop, MD;  Location: [facility] ENDOSCOPY;  Service: Endoscopy;  Laterality: N/A;   IR GASTR TUBE CONVERT GASTR-JEJ PER W/FL MOD SED  11/07/2020   PEG PLACEMENT N/A 10/26/2020   Procedure: PERCUTANEOUS ENDOSCOPIC GASTROSTOMY (PEG) PLACEMENT;  Surgeon: Corliss Skains, MD;  Location: [facility] OR;  Service: Thoracic;  Laterality: N/A;   XI ROBOTIC ASSISTED HIATAL HERNIA REPAIR N/A 10/26/2020  Procedure: XI ROBOTIC ASSISTED HIATAL HERNIA REPAIR;  Surgeon: Corliss Skains, MD;  Location: [facility] OR;  Service: Thoracic;  Laterality: N/A;       Family History  Problem Relation Age of Onset   Breast cancer Mother    Heart disease Father     Social History   Tobacco Use   Smoking status: Never   Smokeless tobacco: Never  Vaping Use   Vaping Use: Never used  Substance Use Topics   Alcohol use: No   Drug use: Never     Home Medications Prior to Admission medications   Medication Sig Start Date End Date Taking? Authorizing Provider  Baclofen 5 MG TABS Take 1 tablet by mouth in the morning, at noon, and at bedtime. 05/30/21   Raulkar, Drema Pry, MD  Cholecalciferol (VITAMIN D) 50 MCG (2000 UT) tablet Take 1 tablet (2,000 Units total) by mouth daily. 12/06/20   Angiulli, Mcarthur Rossetti, PA-C  divalproex (DEPAKOTE) 500 MG DR tablet Take 1 tablet (500 mg total) by mouth every 12 (twelve) hours. 12/07/20   Angiulli, Mcarthur Rossetti, PA-C  docusate sodium (COLACE) 100 MG capsule Take 1 capsule (100 mg total) by mouth 2 (two) times daily. 12/06/20   Angiulli, Mcarthur Rossetti, PA-C  Emollient (CORN HUSKERS EX) Apply 1 application topically in the morning and at bedtime. For dry skin    [provider]  lamoTRIgine (LAMICTAL) 100 MG tablet Take 1 tablet (100 mg total) by mouth daily. 12/07/20   Angiulli, Mcarthur Rossetti, PA-C  levothyroxine (SYNTHROID) 75 MCG tablet Take 1 tablet (75 mcg total) by mouth daily at 6 (six) AM. 12/07/20   Angiulli, Mcarthur Rossetti, PA-C  metoprolol tartrate (LOPRESSOR) 25 MG tablet Take 1 tablet (25 mg total) by mouth 2 (two) times daily. 12/07/20   Angiulli, Mcarthur Rossetti, PA-C  ondansetron (ZOFRAN) 4 MG tablet Take 4 mg by mouth 4 (four) times daily as needed for nausea or vomiting.    [provider]  pantoprazole (PROTONIX) 40 MG tablet Take 1 tablet (40 mg total) by mouth 2 (two) times daily before a meal. 12/06/20   Angiulli, Mcarthur Rossetti, PA-C  polyethylene glycol (MIRALAX / GLYCOLAX) 17 g packet Take 17 g by mouth 2 (two) times daily. 12/06/20   Angiulli, Mcarthur Rossetti, PA-C  QUEtiapine (SEROQUEL) 400 MG tablet Take 1 tablet (400 mg total) by mouth 2 (two) times daily. 12/06/20   Angiulli, Mcarthur Rossetti, PA-C  rosuvastatin (CRESTOR) 20 MG tablet Take 1 tablet (20 mg total) by mouth daily. 12/07/20   Angiulli, Mcarthur Rossetti, PA-C    Allergies    Vancomycin  Review of Systems   Review of Systems  Neurological:   Positive for weakness.  All other systems reviewed and are negative.  Physical Exam Updated Vital Signs BP 111/86 (BP Location: Right Arm)   Pulse (!) 58   Temp 97.6 F (36.4 C)   Resp 14   SpO2 99%   Physical Exam Vitals and nursing note reviewed.  Constitutional:      Comments: Chronically ill and weak on the left side that is chronic  HENT:     Head: Normocephalic.     Nose: Nose normal.     Mouth/Throat:     Mouth: Mucous membranes are moist.  Eyes:     Extraocular Movements: Extraocular movements intact.     Pupils: Pupils are equal, round, and reactive to light.  Cardiovascular:     Rate and Rhythm: Normal rate and regular rhythm.  Pulses: Normal pulses.     Heart sounds: Normal heart sounds.  Pulmonary:     Effort: Pulmonary effort is normal.     Breath sounds: Normal breath sounds.  Abdominal:     General: Abdomen is flat.     Palpations: Abdomen is soft.  Musculoskeletal:        General: Normal range of motion.     Cervical back: Normal range of motion and neck supple.     Comments: Patient has some bruising on the left shoulder and decreased range of motion  Skin:    General: Skin is warm.     Capillary Refill: Capillary refill takes less than 2 seconds.  Neurological:     Comments: Patient is contracted on the left side and has left-sided weakness.  This is baseline.  No obvious facial droop  Psychiatric:        Mood and Affect: Mood normal.        Behavior: Behavior normal.    ED Results / Procedures / Treatments   Labs (all labs ordered are listed, but only abnormal results are displayed) Labs Reviewed  CBC WITH DIFFERENTIAL/PLATELET  COMPREHENSIVE METABOLIC PANEL  URINALYSIS, ROUTINE W REFLEX MICROSCOPIC    EKG EKG Interpretation  Date/Time:  Friday September 15 2021 12:45:58 EDT Ventricular Rate:  56 PR Interval:  136 QRS Duration: 86 QT Interval:  400 QTC Calculation: 386 R Axis:   40 Text Interpretation: Sinus bradycardia  Nonspecific T wave abnormality Abnormal ECG No significant change since last tracing Confirmed by Richardean Canal [phone]) on 09/15/2021 4:20:22 PM  Radiology DG Shoulder Left  Result Date: 09/15/2021 CLINICAL DATA:  left shoulder pain EXAM: LEFT SHOULDER - 2+ VIEW COMPARISON:  None. FINDINGS: No acute fracture or dislocation. Mild degenerative changes of the acromioclavicular joint. No area of erosion or osseous destruction. No unexpected radiopaque foreign body. Soft tissues are unremarkable. IMPRESSION: No acute fracture or dislocation. Electronically Signed   By: Meda Klinefelter M.D.   On: 09/15/2021 14:17    Procedures Procedures   Angiocath insertion Performed by: Richardean Canal  Consent: Verbal consent obtained. Risks and benefits: risks, benefits and alternatives were discussed Time out: Immediately prior to procedure a "time out" was called to verify the correct patient, procedure, equipment, support staff and site/side marked as required.  Preparation: Patient was prepped and draped in the usual sterile fashion.  Vein Location:R antecube   Ultrasound Guided  Gauge: 20 long   Normal blood return and flush without difficulty Patient tolerance: Patient tolerated the procedure well with no immediate complications.   Medications Ordered in ED Medications  LORazepam (ATIVAN) injection 1 mg (has no administration in time range)    ED Course  I have reviewed the triage vital signs and the nursing notes.  Pertinent labs & imaging results that were available during my care of the patient were reviewed by me and considered in my medical decision making (see chart for details).    MDM Rules/Calculators/A&P                           KIN GALBRAITH is a 65 y.o. male here with left-sided weakness. Patient has history of stroke with left-sided weakness.  Patient also has known encephalomalacia.  Consider infection versus another stroke.  We will get MRI brain.  We will get labs  and urinalysis  11:07 PM Labs and UA unremarkable.  MRI brain did not show a stroke.  Stable for discharge.  Final Clinical Impression(s) / ED Diagnoses Final diagnoses:  None    Rx / DC Orders ED Discharge Orders     None        Charlynne Pander, MD 09/15/21 2307

## 2021-09-15 NOTE — Discharge Instructions (Signed)
Your MRI today did not showed an acute stroke. Your labs and urinalysis were unremarkable   Continue your current meds.  See your doctor   Return to ER if you have another fall, weakness

## 2021-09-15 NOTE — ED Notes (Signed)
This tech assisted patient to the bathroom, patient tried to stand with staff present, patient was unsteady on his feet while trying to use the restroom. Patient attempted to use the urinal in a seated position and was unsuccessful, patient wearing fall risk band.

## 2021-09-15 NOTE — ED Notes (Signed)
Patient transported to MRI 

## 2021-09-15 NOTE — ED Triage Notes (Addendum)
Pt from group home with ems, staff states he almost fell while walking with a walker today. Hx of stroke with left sided weakness. Pt has no complaints, no other deficits noted with ems.   During triage pt reports he did fall today, c.o left shoulder pain.

## 2021-09-19 ENCOUNTER — Encounter: Payer: Self-pay | Admitting: Physical Medicine and Rehabilitation

## 2021-09-19 ENCOUNTER — Encounter
Payer: Medicare Other | Attending: Physical Medicine and Rehabilitation | Admitting: Physical Medicine and Rehabilitation

## 2021-09-19 ENCOUNTER — Other Ambulatory Visit: Payer: Self-pay

## 2021-09-19 ENCOUNTER — Telehealth: Payer: Self-pay | Admitting: Neurology

## 2021-09-19 VITALS — BP 112/73 | HR 68 | Temp 98.3°F | Ht 64.0 in | Wt 159.6 lb

## 2021-09-19 DIAGNOSIS — R252 Cramp and spasm: Secondary | ICD-10-CM | POA: Insufficient documentation

## 2021-09-19 DIAGNOSIS — I69398 Other sequelae of cerebral infarction: Secondary | ICD-10-CM | POA: Insufficient documentation

## 2021-09-19 NOTE — Telephone Encounter (Signed)
[facility]) patient having some weakness; admitted to the hospital for 1 night. Would like a call from the nurse to discuss a sooner appt.

## 2021-09-19 NOTE — Progress Notes (Signed)
Botox Injection for spasticity using needle ultrasound guidance  Indication: Severe spasticity which interferes with ADL,mobility and/or  hygiene and is unresponsive to medication management and other conservative care Informed consent was obtained after describing risks and benefits of the procedure with the patient. This includes bleeding, bruising, infection, excessive weakness, or medication side effects. A REMS form is on file and signed.  Soleus: 100U (divided in 3 locations) Gastrocnemius: 100U (divided in 3 locations)  All injections were done after obtaining appropriate ultrasound visualization and after negative drawback for blood. The patient tolerated the procedure well. Post procedure instructions were given. A followup appointment was made.

## 2021-09-19 NOTE — Telephone Encounter (Signed)
I returned the call to [facility] group home. The patient missed his last appt here due to hospitalization. His next pending appt was 01/11/2022. Martie Lee called to get him seen sooner. He has been rescheduled to 10/26/21 at 1pm (he will check in at 12:30pm).

## 2021-09-26 ENCOUNTER — Ambulatory Visit: Payer: Medicare Other | Admitting: Physical Medicine and Rehabilitation

## 2021-10-02 ENCOUNTER — Encounter (HOSPITAL_BASED_OUTPATIENT_CLINIC_OR_DEPARTMENT_OTHER): Payer: Self-pay | Admitting: Obstetrics and Gynecology

## 2021-10-02 ENCOUNTER — Emergency Department (HOSPITAL_BASED_OUTPATIENT_CLINIC_OR_DEPARTMENT_OTHER)
Admission: EM | Admit: 2021-10-02 | Discharge: 2021-10-02 | Disposition: A | Discharge status: Home / Self Care | Payer: Medicare Other | Attending: Emergency Medicine | Admitting: Emergency Medicine

## 2021-10-02 ENCOUNTER — Other Ambulatory Visit: Payer: Self-pay

## 2021-10-02 ENCOUNTER — Emergency Department (HOSPITAL_BASED_OUTPATIENT_CLINIC_OR_DEPARTMENT_OTHER): Payer: Medicare Other

## 2021-10-02 DIAGNOSIS — Z79899 Other long term (current) drug therapy: Secondary | ICD-10-CM | POA: Insufficient documentation

## 2021-10-02 DIAGNOSIS — E039 Hypothyroidism, unspecified: Secondary | ICD-10-CM | POA: Insufficient documentation

## 2021-10-02 DIAGNOSIS — I10 Essential (primary) hypertension: Secondary | ICD-10-CM | POA: Diagnosis not present

## 2021-10-02 DIAGNOSIS — E119 Type 2 diabetes mellitus without complications: Secondary | ICD-10-CM | POA: Insufficient documentation

## 2021-10-02 DIAGNOSIS — S0990XA Unspecified injury of head, initial encounter: Secondary | ICD-10-CM | POA: Diagnosis present

## 2021-10-02 DIAGNOSIS — Y92009 Unspecified place in unspecified non-institutional (private) residence as the place of occurrence of the external cause: Secondary | ICD-10-CM | POA: Diagnosis not present

## 2021-10-02 DIAGNOSIS — W19XXXA Unspecified fall, initial encounter: Secondary | ICD-10-CM | POA: Diagnosis not present

## 2021-10-02 NOTE — Discharge Instructions (Addendum)
Normal head CTs.  No acute injuries.

## 2021-10-02 NOTE — ED Provider Notes (Signed)
[facility] EMERGENCY DEPT Provider Note   CSN: [id number] Arrival date & time: 10/02/21  1121     History Chief Complaint  Patient presents with   [facility] [name] is a 65 y.o. male.  Mechanical fall prior to arrival.  Hit the left side of his head.  Did not lose consciousness.  Does not have any pain.  No extremity pain.  Was able to walk afterwards.  The history is provided by the patient.  Fall This is a new problem. The current episode started less than 1 hour ago. The problem has been resolved. Pertinent negatives include no chest pain, no abdominal pain, no headaches and no shortness of breath. Nothing aggravates the symptoms. Nothing relieves the symptoms. He has tried nothing for the symptoms. The treatment provided no relief.      Past Medical History:  Diagnosis Date   Barrett esophagus    DM II (diabetes mellitus, type II), controlled (HCC) 11/13/2020   Dysphagia    Gastroparesis    GERD (gastroesophageal reflux disease)    Hypercholesteremia    Hypertension    Iron deficiency anemia    Mental retardation    Schizophrenia (HCC)    Seizure (HCC)    Stroke [facility])     Patient Active Problem List   Diagnosis Date Noted   S/P craniotomy 03/09/2021   Dilation of esophagus    Hyponatremia    Dysphagia    Seizures (HCC)    S/P percutaneous endoscopic gastrostomy (PEG) tube placement (HCC)    Debility 11/15/2020   DM II (diabetes mellitus, type II), controlled (HCC) 11/13/2020   Bowel obstruction (HCC)    S/P repair of paraesophageal hernia 10/26/2020   Syncope 10/21/2020   Hiatal hernia 10/21/2020   Acute hypoxemic respiratory failure (HCC) 10/21/2020   Sinus tachycardia 10/21/2020   Gastric polyps    Altered mental status 09/05/2020   Emesis 07/24/2015   Coffee ground emesis    Gastrointestinal hemorrhage associated with other gastritis    Acute esophagitis 01/09/2015   Upper gastrointestinal bleed    Diarrhea    Nausea  with vomiting    GI bleed 01/08/2015   GIB (gastrointestinal bleeding) 01/08/2015   Nausea & vomiting 01/08/2015   Reflux esophagitis 07/04/2009   Loss of weight 07/04/2009   BLOOD IN STOOL, OCCULT 07/04/2009   ANEMIA, IRON DEFICIENCY 05/04/2008   Hypothyroidism 04/05/2008   Schizophrenia, unspecified type (HCC) 04/05/2008   Depression 04/05/2008   Hemiplegia (HCC) 04/05/2008   Essential hypertension 04/05/2008   GERD 04/05/2008   Gastroparesis 04/05/2008   Seizure (HCC) 04/05/2008   History of diverticulosis 08/21/2004   Barrett's esophagus 04/01/2002   HIATAL HERNIA 04/01/2002    Past Surgical History:  Procedure Laterality Date   BIOPSY  10/21/2020   Procedure: BIOPSY;  Surgeon: Iva Boop, MD;  Location: [facility] ENDOSCOPY;  Service: Endoscopy;;   CRANIOTOMY FOR CYST FENESTRATION     ESOPHAGOGASTRODUODENOSCOPY N/A 01/09/2015   Procedure: ESOPHAGOGASTRODUODENOSCOPY (EGD);  Surgeon: Louis Meckel, MD;  Location: [facility] ENDOSCOPY;  Service: Endoscopy;  Laterality: N/A;   ESOPHAGOGASTRODUODENOSCOPY N/A 10/26/2020   Procedure: ESOPHAGOGASTRODUODENOSCOPY (EGD);  Surgeon: Corliss Skains, MD;  Location[facility] OR;  Service: Thoracic;  Laterality: N/A;   ESOPHAGOGASTRODUODENOSCOPY (EGD) WITH PROPOFOL N/A 10/21/2020   Procedure: ESOPHAGOGASTRODUODENOSCOPY (EGD) WITH PROPOFOL;  Surgeon: Iva Boop, MD;  Location: [facility] ENDOSCOPY;  Service: Endoscopy;  Laterality: N/A;   IR GASTR TUBE CONVERT GASTR-JEJ PER W/FL MOD SED  11/07/2020  PEG PLACEMENT N/A 10/26/2020   Procedure: PERCUTANEOUS ENDOSCOPIC GASTROSTOMY (PEG) PLACEMENT;  Surgeon: Corliss Skains, MD;  Location[facility] OR;  Service: Thoracic;  Laterality: N/A;   XI ROBOTIC ASSISTED HIATAL HERNIA REPAIR N/A 10/26/2020   Procedure: XI ROBOTIC ASSISTED HIATAL HERNIA REPAIR;  Surgeon: Corliss Skains, MD;  Location: [facility] OR;  Service: Thoracic;  Laterality: N/A;       Family History  Problem Relation Age of Onset   Breast cancer  Mother    Heart disease Father     Social History   Tobacco Use   Smoking status: Never   Smokeless tobacco: Never  Vaping Use   Vaping Use: Never used  Substance Use Topics   Alcohol use: No   Drug use: Never    Home Medications Prior to Admission medications   Medication Sig Start Date End Date Taking? Authorizing Provider  Cholecalciferol (VITAMIN D) 50 MCG (2000 UT) tablet Take 1 tablet (2,000 Units total) by mouth daily. 12/06/20   Angiulli, Mcarthur Rossetti, PA-C  divalproex (DEPAKOTE) 500 MG DR tablet Take 1 tablet (500 mg total) by mouth every 12 (twelve) hours. 12/07/20   Angiulli, Mcarthur Rossetti, PA-C  docusate sodium (COLACE) 100 MG capsule Take 1 capsule (100 mg total) by mouth 2 (two) times daily. 12/06/20   Angiulli, Mcarthur Rossetti, PA-C  Emollient (CORN HUSKERS EX) Apply 1 application topically in the morning and at bedtime. For dry skin    [provider]  lamoTRIgine (LAMICTAL) 100 MG tablet Take 1 tablet (100 mg total) by mouth daily. 12/07/20   Angiulli, Mcarthur Rossetti, PA-C  levothyroxine (SYNTHROID) 75 MCG tablet Take 1 tablet (75 mcg total) by mouth daily at 6 (six) AM. 12/07/20   Angiulli, Mcarthur Rossetti, PA-C  metoprolol tartrate (LOPRESSOR) 25 MG tablet Take 1 tablet (25 mg total) by mouth 2 (two) times daily. 12/07/20   Angiulli, Mcarthur Rossetti, PA-C  ondansetron (ZOFRAN) 4 MG tablet Take 4 mg by mouth 4 (four) times daily as needed for nausea or vomiting.    [provider]  pantoprazole (PROTONIX) 40 MG tablet Take 1 tablet (40 mg total) by mouth 2 (two) times daily before a meal. 12/06/20   Angiulli, Mcarthur Rossetti, PA-C  polyethylene glycol (MIRALAX / GLYCOLAX) 17 g packet Take 17 g by mouth 2 (two) times daily. 12/06/20   Angiulli, Mcarthur Rossetti, PA-C  QUEtiapine (SEROQUEL) 400 MG tablet Take 1 tablet (400 mg total) by mouth 2 (two) times daily. 12/06/20   Angiulli, Mcarthur Rossetti, PA-C  rosuvastatin (CRESTOR) 20 MG tablet Take 1 tablet (20 mg total) by mouth daily. 12/07/20   Angiulli,  Mcarthur Rossetti, PA-C    Allergies    Vancomycin  Review of Systems   Review of Systems  Constitutional:  Negative for chills and fever.  HENT:  Negative for ear pain and sore throat.   Eyes:  Negative for pain and visual disturbance.  Respiratory:  Negative for cough and shortness of breath.   Cardiovascular:  Negative for chest pain and palpitations.  Gastrointestinal:  Negative for abdominal pain and vomiting.  Genitourinary:  Negative for dysuria and hematuria.  Musculoskeletal:  Negative for arthralgias and back pain.  Skin:  Negative for color change and rash.  Neurological:  Negative for seizures, syncope and headaches.  All other systems reviewed and are negative.  Physical Exam Updated Vital Signs BP (!) 162/97   Pulse 71   Temp 98.3 F (36.8 C)   Resp 18   SpO2 96%  Physical Exam Vitals and nursing note reviewed.  Constitutional:      Appearance: He is well-developed.  HENT:     Head: Normocephalic and atraumatic.  Eyes:     Conjunctiva/sclera: Conjunctivae normal.  Cardiovascular:     Rate and Rhythm: Normal rate and regular rhythm.     Heart sounds: No murmur heard. Pulmonary:     Effort: Pulmonary effort is normal. No respiratory distress.     Breath sounds: Normal breath sounds.  Abdominal:     Palpations: Abdomen is soft.     Tenderness: There is no abdominal tenderness.  Musculoskeletal:        General: No tenderness. Normal range of motion.     Cervical back: Normal range of motion and neck supple. No tenderness.  Skin:    General: Skin is warm and dry.     Capillary Refill: Capillary refill takes less than 2 seconds.  Neurological:     General: No focal deficit present.     Mental Status: He is alert and oriented to person, place, and time.     Cranial Nerves: No cranial nerve deficit.     Sensory: No sensory deficit.     Motor: No weakness.     Coordination: Coordination normal.    ED Results / Procedures / Treatments   Labs (all labs  ordered are listed, but only abnormal results are displayed) Labs Reviewed - No data to display  EKG None  Radiology CT Head Wo Contrast  Result Date: 10/02/2021 CLINICAL DATA:  Status post mechanical fall. EXAM: CT HEAD WITHOUT CONTRAST TECHNIQUE: Contiguous axial images were obtained from the base of the skull through the vertex without intravenous contrast. COMPARISON:  September 15, 2021 FINDINGS: Brain: There is mild cerebral atrophy with widening of the extra-axial spaces and ventricular dilatation. There are areas of decreased attenuation within the white matter tracts of the supratentorial brain, consistent with microvascular disease changes. A large area of encephalomalacia, and adjacent chronic white matter low attenuation is seen within the right frontal lobe. Vascular: No hyperdense vessel or unexpected calcification. Skull: A right frontal lobe craniotomy defect is seen. Sinuses/Orbits: No acute finding. Other: None. IMPRESSION: 1. Large area of encephalomalacia, and adjacent chronic white matter low attenuation within the right frontal lobe. 2. No acute intracranial abnormality. Electronically Signed   By: Aram Candela M.D.   On: 10/02/2021 15:41   CT Cervical Spine Wo Contrast  Result Date: 10/02/2021 CLINICAL DATA:  Unwitnessed fall. EXAM: CT CERVICAL SPINE WITHOUT CONTRAST TECHNIQUE: Multidetector CT imaging of the cervical spine was performed without intravenous contrast. Multiplanar CT image reconstructions were also generated. COMPARISON:  May 08, 2021 FINDINGS: Alignment: Normal. Skull base and vertebrae: No acute fracture. No primary bone lesion or focal pathologic process. Soft tissues and spinal canal: No prevertebral fluid or swelling. No visible canal hematoma. Disc levels: Moderate severity anterior osteophyte formation is seen at the levels of C3-C4, C4-C5, C5-C6 and C6-C7. Mild to moderate severity intervertebral disc space narrowing is seen at the levels of C3-C4,  C4-C5, C5-C6 and C6-C7. Moderate to marked severity facet joint hypertrophy is noted at the level of C2-C3 on the right. Mild, bilateral multilevel facet joint hypertrophy is noted throughout the remainder of the cervical spine. Upper chest: Negative. Other: None. IMPRESSION: Moderate severity multilevel degenerative changes without evidence of an acute fracture or subluxation. Electronically Signed   By: Aram Candela M.D.   On: 10/02/2021 15:45    Procedures Procedures   Medications Ordered  in ED Medications - No data to display  ED Course  I have reviewed the triage vital signs and the nursing notes.  Pertinent labs & imaging results that were available during my care of the patient were reviewed by me and considered in my medical decision making (see chart for details).    MDM Rules/Calculators/A&P                           BEXTON HAAK is here for evaluation after fall.  At his group home he fell while using his walker.  Hit the left side of his head.  No loss of consciousness.  No current pain at this time.  Brought for evaluation.  Head and neck CT were obtained that were normal.  No acute injuries.  No extremity pain.  He was ambulatory after the fall.  He is not on blood thinners.  Overall given reassurance and discharged in ED in good condition.  This chart was dictated using voice recognition software.  Despite best efforts to proofread,  errors can occur which can change the documentation meaning.   Final Clinical Impression(s) / ED Diagnoses Final diagnoses:  Fall, initial encounter    Rx / DC Orders ED Discharge Orders     None        Virgina Norfolk, DO 10/02/21 1605

## 2021-10-02 NOTE — ED Triage Notes (Signed)
Patient reports to the ER BIB EMS for a fall at the group home. Patient had a mechanical fall from not using his walker and hit his head oin the toilet. Patient is not on blood thinners, no LOC, denies head pain.

## 2021-10-02 NOTE — ED Notes (Signed)
Pt denis pain, N/V and states vision is good.

## 2021-10-26 ENCOUNTER — Ambulatory Visit (INDEPENDENT_AMBULATORY_CARE_PROVIDER_SITE_OTHER): Payer: Medicare Other | Admitting: Neurology

## 2021-10-26 ENCOUNTER — Encounter: Payer: Self-pay | Admitting: Neurology

## 2021-10-26 VITALS — BP 122/69 | HR 64 | Ht 64.0 in | Wt 158.0 lb

## 2021-10-26 DIAGNOSIS — I69954 Hemiplegia and hemiparesis following unspecified cerebrovascular disease affecting left non-dominant side: Secondary | ICD-10-CM | POA: Insufficient documentation

## 2021-10-26 DIAGNOSIS — G40909 Epilepsy, unspecified, not intractable, without status epilepticus: Secondary | ICD-10-CM | POA: Diagnosis not present

## 2021-10-26 DIAGNOSIS — Z9889 Other specified postprocedural states: Secondary | ICD-10-CM

## 2021-10-26 NOTE — Progress Notes (Signed)
Chief Complaint  Patient presents with   Follow-up    Rm 12, with care taker, states he is doing well      ASSESSMENT AND PLAN  [name] is a 65 y.o. male   History of benign intracranial tumor surgery, stroke perioperative period of time in Apr 09, 2003, with residual mild spastic left hemiparesis Seizure Schizophrenia Polypharmacy treatment  His multiple recurrent increased confusion, fell, passing out episode when he was dealing with large size periesophageal hernia in September 2021, has much improved since surgery, there was no clinical seizure like activity/passing out reported,  Has been on stable dose of Depakote DR 500 mg twice a day, lamotrigine 100 mg daily, I have suggested him to move his lamotrigine from morning to night to avoid the side effect of excessive daytime drowsiness  Continue physical therapy  Continue follow-up by his primary care physician, only return to clinic for new issues  DIAGNOSTIC DATA (LABS, IMAGING, TESTING) - I reviewed patient records, labs, notes, testing and imaging myself where available.  CT head without contrast from [facility] in 04/08/2018: Encephalomalacia of right frontal lobe with small vessel disease in the superior right centrum semiovale, stable parietal craniotomy of the right and left superior frontal region,  Echocardiogram December 2021: Ejection fraction 65 to 70%  EEG October 21, 2020, mild to moderate diffuse encephalopathy, no epileptiform discharge   HISTORICAL data  [name] a 65 year old male, seen in request by his primary care PA [name], Gerald A, PA for evaluation of passing out spells, he is accompanied by his sister [name], who is also his power of attorney at today's visit on March 09, 2021.  I reviewed and summarized the referring note.  Past medical history Stroke with residual left side weakness, Seizure, Intellectual delay Dysphagia, Barrett's esophagus, GERD  with gastroparesis, Hypothyroidism, on supplement Hypertension Hyperlipidemia Schizophrenia Diabetes History of Craniotomy,   Patient had a history of craniotomy for benign brain tumor removal in 2003-04-09 at [facility], postsurgically, he developed complications, required a second surgery, which is further complicated by stroke, with residual left-sided weakness.  The history is from his sister, I could not find the medical records through epic system  Patient also had lifelong history of schizophrenia, developmentally delayed, intellectual disability, lived with his father, who passed away in 09-Apr-2007, eventually he was placed at a group home since 04/09/11,  He began to have seizure following his craniotomy in 2003/04/09, was managed by outside neurologist, has been seizure-free for more than 10 years, stable on current medication of Depakote DR 500 mg twice a day, and lamotrigine 100 mg daily  In September 2021, he was found to be confused, altered mental status, hypotension, that was improved with hospital admission, hydration, hospital admission again in November 2021, for aspiration pneumonia, 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, periesophagus hernia, underwent robotic assistant laparoscopy, paraesophageal hernia repair, followed by prolonged rehabilitation,  His condition has gradually stabilized since, during the process, he was noted to have confusion, near syncope episode, fall at nursing home, there was no clinical seizure activity noted  Since the surgery, he has much improved, almost back to his baseline, he has unsteady gait, carry on simple conversation, has good appetite, residual left hemiparesis  UPDATE Oct 26 2021: He is accompanied by his group home staff [name] at today's visit, who has known patient for 1 year, patient is overall doing well, has back to his baseline, have good appetite, sleeping well,  continue have language difficulty, gait abnormality, no  seizure-like activity      PHYSICAL EXAM   Vitals:   10/26/21 1253  BP: 122/69  Pulse: 64  Weight: 158 lb (71.7 kg)  Height: 5\' 4"  (1.626 m)   Not recorded     Body mass index is 27.12 kg/m.  PHYSICAL EXAMNIATION:  Gen: NAD, conversant, well nourised, well groomed         NEUROLOGICAL EXAM:  MENTAL STATUS: Speech/cognition: Awake, alert, cooperative on examination, slurred speech, no is date of birth, and age   CRANIAL NERVES: CN II: Visual fields are full to confrontation. Pupils are round equal and briskly reactive to light. CN III, IV, VI: extraocular movement are normal. No ptosis. CN V: Facial sensation is intact to light touch CN VII: Face is symmetric with normal eye closure  CN VIII: Hearing is normal to causal conversation. CN IX, X: Phonation is normal. CN XI: Head turning and shoulder shrug are intact  MOTOR: Spastic left hemiparesis Barely antigravity movement of left upper extremity, mild drift of left leg, wear left AFO  REFLEXES: Hyperreflexia on the left side  SENSORY: Intact to light touch, pinprick and vibratory sensation are intact in fingers and toes.  COORDINATION: There is no trunk or limb dysmetria noted.  GAIT/STANCE: Need assistant to get up from seated position, dragging left leg, rely on his walker  REVIEW OF SYSTEMS: Full 14 system review of systems performed and notable only for as above All other review of systems were negative.  ALLERGIES: Allergies  Allergen Reactions   Vancomycin Other (See Comments)    Unknown-possible hives     HOME MEDICATIONS: Current Outpatient Medications  Medication Sig Dispense Refill   Cholecalciferol (VITAMIN D) 50 MCG (2000 UT) tablet Take 1 tablet (2,000 Units total) by mouth daily. 30 tablet 0   divalproex (DEPAKOTE) 500 MG DR tablet Take 1 tablet (500 mg total) by mouth every 12 (twelve) hours. 60 tablet 0   docusate sodium (COLACE) 100 MG capsule Take 1 capsule (100 mg total) by  mouth 2 (two) times daily. 10 capsule 0   Emollient (CORN HUSKERS EX) Apply 1 application topically in the morning and at bedtime. For dry skin     lamoTRIgine (LAMICTAL) 100 MG tablet Take 1 tablet (100 mg total) by mouth daily. 30 tablet 0   levothyroxine (SYNTHROID) 75 MCG tablet Take 1 tablet (75 mcg total) by mouth daily at 6 (six) AM. 30 tablet 0   metoprolol tartrate (LOPRESSOR) 25 MG tablet Take 1 tablet (25 mg total) by mouth 2 (two) times daily. 60 tablet 0   ondansetron (ZOFRAN) 4 MG tablet Take 4 mg by mouth 4 (four) times daily as needed for nausea or vomiting.     pantoprazole (PROTONIX) 40 MG tablet Take 1 tablet (40 mg total) by mouth 2 (two) times daily before a meal. 60 tablet 0   polyethylene glycol (MIRALAX / GLYCOLAX) 17 g packet Take 17 g by mouth 2 (two) times daily. 14 each 0   QUEtiapine (SEROQUEL) 400 MG tablet Take 1 tablet (400 mg total) by mouth 2 (two) times daily. 60 tablet 0   rosuvastatin (CRESTOR) 20 MG tablet Take 1 tablet (20 mg total) by mouth daily. 30 tablet 0   No current facility-administered medications for this visit.    PAST MEDICAL HISTORY: Past Medical History:  Diagnosis Date   Barrett esophagus    DM II (diabetes mellitus, type II), controlled (HCC) 11/13/2020   Dysphagia  Gastroparesis    GERD (gastroesophageal reflux disease)    Hypercholesteremia    Hypertension    Iron deficiency anemia    Mental retardation    Schizophrenia (HCC)    Seizure (HCC)    Stroke [facility])     PAST SURGICAL HISTORY: Past Surgical History:  Procedure Laterality Date   BIOPSY  10/21/2020   Procedure: BIOPSY;  Surgeon: Iva Boop, MD;  Location: [facility] ENDOSCOPY;  Service: Endoscopy;;   CRANIOTOMY FOR CYST FENESTRATION     ESOPHAGOGASTRODUODENOSCOPY N/A 01/09/2015   Procedure: ESOPHAGOGASTRODUODENOSCOPY (EGD);  Surgeon: Louis Meckel, MD;  Location: [facility] ENDOSCOPY;  Service: Endoscopy;  Laterality: N/A;   ESOPHAGOGASTRODUODENOSCOPY N/A 10/26/2020    Procedure: ESOPHAGOGASTRODUODENOSCOPY (EGD);  Surgeon: Corliss Skains, MD;  [facility] OR;  Service: Thoracic;  Laterality: N/A;   ESOPHAGOGASTRODUODENOSCOPY (EGD) WITH PROPOFOL N/A 10/21/2020   Procedure: ESOPHAGOGASTRODUODENOSCOPY (EGD) WITH PROPOFOL;  Surgeon: Iva Boop, MD;  Location: [facility] ENDOSCOPY;  Service: Endoscopy;  Laterality: N/A;   IR GASTR TUBE CONVERT GASTR-JEJ PER W/FL MOD SED  11/07/2020   PEG PLACEMENT N/A 10/26/2020   Procedure: PERCUTANEOUS ENDOSCOPIC GASTROSTOMY (PEG) PLACEMENT;  Surgeon: Corliss Skains, MD;  Location: [facility] OR;  Service: Thoracic;  Laterality: N/A;   XI ROBOTIC ASSISTED HIATAL HERNIA REPAIR N/A 10/26/2020   Procedure: XI ROBOTIC ASSISTED HIATAL HERNIA REPAIR;  Surgeon: Corliss Skains, MD;  Location: [facility] OR;  Service: Thoracic;  Laterality: N/A;    FAMILY HISTORY: Family History  Problem Relation Age of Onset   Breast cancer Mother    Heart disease Father     SOCIAL HISTORY: Social History   Socioeconomic History   Marital status: Single    Spouse name: Not on file   Number of children: 0   Years of education: 12   Highest education level: High school graduate  Occupational History   Occupation: Disabled  Tobacco Use   Smoking status: Never   Smokeless tobacco: Never  Vaping Use   Vaping Use: Never used  Substance and Sexual Activity   Alcohol use: No   Drug use: Never   Sexual activity: Never  Other Topics Concern   Not on file  Social History Narrative   Lives at [facility] group home (ph: [phone]).   Left-handed.   No daily caffeine use.   Social Determinants of Health   Financial Resource Strain: Not on file  Food Insecurity: Not on file  Transportation Needs: Not on file  Physical Activity: Not on file  Stress: Not on file  Social Connections: Not on file  Intimate Partner Violence: Not on file      Levert Feinstein, M.D. Ph.D.  [facility] [address], [address] [place], [place]  [address] Ph: [phone] Fax: [phone]  CC:  Lucretia Field [address] [place],  [place] [address]  Katina Dung M

## 2021-10-26 NOTE — Patient Instructions (Signed)
Change Lamotrigine to 100mg  every night, NOT every morning.

## 2021-12-26 ENCOUNTER — Ambulatory Visit: Payer: Medicare Other | Admitting: Physical Medicine and Rehabilitation

## 2022-01-02 ENCOUNTER — Encounter
Payer: Medicare Other | Attending: Physical Medicine and Rehabilitation | Admitting: Physical Medicine and Rehabilitation

## 2022-01-02 ENCOUNTER — Encounter: Payer: Self-pay | Admitting: Physical Medicine and Rehabilitation

## 2022-01-02 ENCOUNTER — Other Ambulatory Visit: Payer: Self-pay

## 2022-01-02 VITALS — BP 116/71 | HR 64 | Temp 99.1°F | Ht 64.0 in | Wt 154.0 lb

## 2022-01-02 DIAGNOSIS — I10 Essential (primary) hypertension: Secondary | ICD-10-CM | POA: Insufficient documentation

## 2022-01-02 DIAGNOSIS — I69398 Other sequelae of cerebral infarction: Secondary | ICD-10-CM | POA: Diagnosis present

## 2022-01-02 DIAGNOSIS — R252 Cramp and spasm: Secondary | ICD-10-CM | POA: Diagnosis present

## 2022-01-02 DIAGNOSIS — K5900 Constipation, unspecified: Secondary | ICD-10-CM | POA: Diagnosis present

## 2022-01-02 MED ORDER — METOPROLOL TARTRATE 25 MG PO TABS: 12.5000 mg | ORAL_TABLET | Freq: Two times a day (BID) | ORAL | 0 refills | Status: DC

## 2022-01-02 NOTE — Progress Notes (Signed)
Subjective:    Patient ID: [name], male    DOB: Mar 14, 1956, 66 y.o.   MRN: [id number]  HPI  33) Debility [name] is a 66 year old man admitted to CIR with debility. His caregiver reports that his left side continues to be weak -He has been going very well at his group home. Accompanied by a staff member today.  -He used to walk every day, but sometimes his weakness inhibits him -he is no longer receiving any therapy.   2) HTN -blood pressure has been well controlled -they check morning and night and it is rarely elevated  3) Constipation -having regular BM -taking colace BID  He has been ambulating with RW  He has followed with neurology and requires no medication refills.  He recently received left hand and foot orthotics- wearing AFO now and uses wrist splint at night  Denies pain   Pain Inventory Average Pain 0 Pain Right Now 0 My pain is  No Pain her for Stroke follow up  LOCATION OF PAIN  No pain  BOWEL Number of stools per week: 7 Oral laxative use Yes  Type of laxative Miralax Enema or suppository use No  History of colostomy No  Incontinent No   BLADDER Normal In and out cath, frequency N/A Able to self cath No  Bladder incontinence No  Frequent urination No  Leakage with coughing No  Difficulty starting stream No  Incomplete bladder emptying No    Mobility use a walker how many minutes can you walk? 5-10 ability to climb steps?  yes do you drive?  no needs help with transfers  Function retired I need assistance with the following:  dressing, bathing, toileting, meal prep, household duties and shopping  Neuro/Psych trouble walking    Family History  Problem Relation Age of Onset   Breast cancer Mother    Heart disease Father    Social History   Socioeconomic History   Marital status: Single    Spouse name: Not on file   Number of children: 0   Years of education: 12   Highest education level: High school  graduate  Occupational History   Occupation: Disabled  Tobacco Use   Smoking status: Never   Smokeless tobacco: Never  Vaping Use   Vaping Use: Never used  Substance and Sexual Activity   Alcohol use: No   Drug use: Never   Sexual activity: Never  Other Topics Concern   Not on file  Social History Narrative   Lives at [facility] home (ph: [phone]).   Left-handed.   No daily caffeine use.   Social Determinants of Health   Financial Resource Strain: Not on file  Food Insecurity: Not on file  Transportation Needs: Not on file  Physical Activity: Not on file  Stress: Not on file  Social Connections: Not on file   Past Surgical History:  Procedure Laterality Date   BIOPSY  10/21/2020   Procedure: BIOPSY;  Surgeon: Gatha Mayer, MD;  Location: [place];  Service: Endoscopy;;   CRANIOTOMY FOR CYST FENESTRATION     ESOPHAGOGASTRODUODENOSCOPY N/A 01/09/2015   Procedure: ESOPHAGOGASTRODUODENOSCOPY (EGD);  Surgeon: Inda Castle, MD;  Location: [place];  Service: Endoscopy;  Laterality: N/A;   ESOPHAGOGASTRODUODENOSCOPY N/A 10/26/2020   Procedure: ESOPHAGOGASTRODUODENOSCOPY (EGD);  Surgeon: Lajuana Matte, MD;  Location: [facility] OR;  Service: Thoracic;  Laterality: N/A;   ESOPHAGOGASTRODUODENOSCOPY (EGD) WITH PROPOFOL N/A 10/21/2020   Procedure: ESOPHAGOGASTRODUODENOSCOPY (EGD) WITH PROPOFOL;  Surgeon:  Gatha Mayer, MD;  Location: [facility] ENDOSCOPY;  Service: Endoscopy;  Laterality: N/A;   IR GASTR TUBE CONVERT GASTR-JEJ PER W/FL MOD SED  11/07/2020   PEG PLACEMENT N/A 10/26/2020   Procedure: PERCUTANEOUS ENDOSCOPIC GASTROSTOMY (PEG) PLACEMENT;  Surgeon: Lajuana Matte, MD;  Location: [place];  Service: Thoracic;  Laterality: N/A;   XI ROBOTIC ASSISTED HIATAL HERNIA REPAIR N/A 10/26/2020   Procedure: XI ROBOTIC ASSISTED HIATAL HERNIA REPAIR;  Surgeon: Lajuana Matte, MD;  Location: [place];  Service: Thoracic;  Laterality: N/A;   Past Medical History:   Diagnosis Date   Barrett esophagus    DM II (diabetes mellitus, type II), controlled ([place]) 11/13/2020   Dysphagia    Gastroparesis    GERD (gastroesophageal reflux disease)    Hypercholesteremia    Hypertension    Iron deficiency anemia    Mental retardation    Schizophrenia ([place])    Seizure ([place])    Stroke ([place])    BP 116/71    Pulse 64    Temp 99.1 F (37.3 C)    Ht 5\' 4"  (1.626 m)    Wt 154 lb (69.9 kg) Comment: last weight recorded at nsg home 12/22   SpO2 94%    BMI 26.43 kg/m   Opioid Risk Score:   Fall Risk Score:  `1  Depression screen PHQ 2/9  Depression screen [facility] 2/9 01/02/2022 09/19/2021 05/30/2021 03/28/2021  Decreased Interest 0 2 2 2   Down, Depressed, Hopeless 0 2 2 2   PHQ - 2 Score 0 4 4 4   Altered sleeping - - - 0  Tired, decreased energy - - - 1  Change in appetite - - - 0  Feeling bad or failure about yourself  - - - 0  Trouble concentrating - - - 1  Moving slowly or fidgety/restless - - - 0  Suicidal thoughts - - - 0  PHQ-9 Score - - - 6   Review of Systems  Constitutional: Negative.   HENT: Negative.    Eyes: Negative.   Respiratory: Negative.    Cardiovascular: Negative.   Gastrointestinal: Negative.   Endocrine: Negative.   Genitourinary: Negative.   Musculoskeletal:  Positive for gait problem.  Skin: Negative.   Allergic/Immunologic: Negative.   Neurological:  Positive for speech difficulty.  Hematological: Negative.   Psychiatric/Behavioral:  Positive for dysphoric mood.   All other systems reviewed and are negative.     Objective:   Physical Exam Gen: no distress, normal appearing HEENT: oral mucosa pink and moist, NCAT Cardio: Reg rate Chest: normal effort, normal rate of breathing Abd: soft, non-distended Ext: no edema Psych: pleasant, normal affect Skin: intact Neuro: Alert, but often unable to answer questions on his own Musculoskeletal: left side decreased strength, 2/5 wrist extension, 3/5 EE and EE. 0/5 PF. Wearing AFO. MAS  1 in left EF, WF, KF. In wheelchair     Assessment & Plan:  [name] is a 66 year old man who presents for hospital follow-up after CIR admission for debility.   1) Debility: -given loss of balance, weakness, and spasticity, he would benefit from refreshed of PT and OT- indicated in recommendations section of his sheet -continue daily walk and incorporate daily resistance training- discussed squats and pushups and lifting light weights  2) post-stroke spasticity: -emphasized the importance of daily stretching -d/c baclofen -no need for more Botox  3) post-stroke weakness -Continue left wrist WHO and left AFO.  -restart therapy  3) HTN: -decrease lopressor to 12.5mg   BID -Advised checking BP daily at home and logging results to bring into follow-up appointment with her PCP and myself. -Reviewed BP meds today.  -Advised regarding healthy foods that can help lower blood pressure and provided with a list: 1) citrus foods- high in vitamins and minerals 2) salmon and other fatty fish - reduces inflammation and oxylipins 3) swiss chard (leafy green)- high level of nitrates 4) pumpkin seeds- one of the best natural sources of magnesium 5) Beans and lentils- high in fiber, magnesium, and potassium 6) Berries- high in flavonoids 7) Amaranth (whole grain, can be cooked similarly to rice and oats)- high in magnesium and fiber 8) Pistachios- even more effective at reducing BP than other nuts 9) Carrots- high in phenolic compounds that relax blood vessels and reduce inflammation 10) Celery- contain phthalides that relax tissues of arterial walls 11) Tomatoes- can also improve cholesterol and reduce risk of heart disease 12) Broccoli- good source of magnesium, calcium, and potassium 13) Greek yogurt: high in potassium and calcium 14) Herbs and spices: Celery seed, cilantro, saffron, lemongrass, black cumin, ginseng, cinnamon, cardamom, sweet basil, and ginger 15) Chia and flax seeds- also  help to lower cholesterol and blood sugar 16) Beets- high levels of nitrates that relax blood vessels  17) spinach and bananas- high in potassium  -Provided lise of supplements that can help with hypertension:  1) magnesium: one high quality brand is Bioptemizers since it contains all 7 types of magnesium, otherwise over the counter magnesium gluconate 400mg  is a good option 2) B vitamins 3) vitamin D 4) potassium 5) CoQ10 6) L-arginine 7) Vitamin C 8) Beetroot -Educated that goal BP is 120/80. -Made goal to incorporate some of the above foods into diet.    4) Constipation:  -Provided list of following foods that help with constipation and highlighted a few: 1) prunes- contain high amounts of fiber.  2) apples- has a form of dietary fiber called pectin that accelerates stool movement and increases beneficial gut bacteria 3) pears- in addition to fiber, also high in fructose and sorbitol which have laxative effect 4) figs- contain an enzyme ficin which helps to speed colonic transit 5) kiwis- contain an enzyme actinidin that improves gut motility and reduces constipation 6) oranges- rich in pectin (like apples) 7) grapefruits- contain a flavanol naringenin which has a laxative effect 8) vegetables- rich in fiber and also great sources of folate, vitamin C, and K 9) artichoke- high in inulin, prebiotic great for the microbiome 10) chicory- increases stool frequency and softness (can be added to coffee) 11) rhubarb- laxative effect 12) sweet potato- high fiber 13) beans, peas, and lentils- contain both soluble and insoluble fiber 14) chia seeds- improves intestinal health and gut flora 15) flaxseeds- laxative effect 16) whole grain rye bread- high in fiber 17) oat bran- high in soluble and insoluble fiber 18) kefir- softens stools -recommended to try at least one of these foods every day.  -drink 6-8 glasses of water per day -walk regularly, especially after meals.

## 2022-01-02 NOTE — Patient Instructions (Signed)
Constipation:  -Provided list of following foods that help with constipation and highlighted a few: 1) prunes- contain high amounts of fiber.  2) apples- has a form of dietary fiber called pectin that accelerates stool movement and increases beneficial gut bacteria 3) pears- in addition to fiber, also high in fructose and sorbitol which have laxative effect 4) figs- contain an enzyme ficin which helps to speed colonic transit 5) kiwis- contain an enzyme actinidin that improves gut motility and reduces constipation 6) oranges- rich in pectin (like apples) 7) grapefruits- contain a flavanol naringenin which has a laxative effect 8) vegetables- rich in fiber and also great sources of folate, vitamin C, and K 9) artichoke- high in inulin, prebiotic great for the microbiome 10) chicory- increases stool frequency and softness (can be added to coffee) 11) rhubarb- laxative effect 12) sweet potato- high fiber 13) beans, peas, and lentils- contain both soluble and insoluble fiber 14) chia seeds- improves intestinal health and gut flora 15) flaxseeds- laxative effect 16) whole grain rye bread- high in fiber 17) oat bran- high in soluble and insoluble fiber 18) kefir- softens stools -recommended to try at least one of these foods every day.  -drink 6-8 glasses of water per day -walk regularly, especially after meals.     HTN: -BP is ___today.  -Advised checking BP daily at home and logging results to bring into follow-up appointment with her PCP and myself. -Reviewed BP meds today.  -Advised regarding healthy foods that can help lower blood pressure and provided with a list: 1) citrus foods- high in vitamins and minerals 2) salmon and other fatty fish - reduces inflammation and oxylipins 3) swiss chard (leafy green)- high level of nitrates 4) pumpkin seeds- one of the best natural sources of magnesium 5) Beans and lentils- high in fiber, magnesium, and potassium 6) Berries- high in  flavonoids 7) Amaranth (whole grain, can be cooked similarly to rice and oats)- high in magnesium and fiber 8) Pistachios- even more effective at reducing BP than other nuts 9) Carrots- high in phenolic compounds that relax blood vessels and reduce inflammation 10) Celery- contain phthalides that relax tissues of arterial walls 11) Tomatoes- can also improve cholesterol and reduce risk of heart disease 12) Broccoli- good source of magnesium, calcium, and potassium 13) Greek yogurt: high in potassium and calcium 14) Herbs and spices: Celery seed, cilantro, saffron, lemongrass, black cumin, ginseng, cinnamon, cardamom, sweet basil, and ginger 15) Chia and flax seeds- also help to lower cholesterol and blood sugar 16) Beets- high levels of nitrates that relax blood vessels  17) spinach and bananas- high in potassium  -Provided lise of supplements that can help with hypertension:  1) magnesium: one high quality brand is Bioptemizers since it contains all 7 types of magnesium, otherwise over the counter magnesium gluconate 400mg  is a good option 2) B vitamins 3) vitamin D 4) potassium 5) CoQ10 6) L-arginine 7) Vitamin C 8) Beetroot -Educated that goal BP is 120/80. -Made goal to incorporate some of the above foods into diet.

## 2022-01-11 ENCOUNTER — Ambulatory Visit: Payer: Medicare Other | Admitting: Neurology

## 2022-07-10 ENCOUNTER — Encounter: Payer: Self-pay | Admitting: Physical Medicine and Rehabilitation

## 2022-07-10 ENCOUNTER — Encounter
Payer: Medicare Other | Attending: Physical Medicine and Rehabilitation | Admitting: Physical Medicine and Rehabilitation

## 2022-07-10 VITALS — BP 154/75 | HR 100 | Ht 64.0 in | Wt 143.2 lb

## 2022-07-10 DIAGNOSIS — I69398 Other sequelae of cerebral infarction: Secondary | ICD-10-CM | POA: Insufficient documentation

## 2022-07-10 DIAGNOSIS — K5900 Constipation, unspecified: Secondary | ICD-10-CM | POA: Insufficient documentation

## 2022-07-10 DIAGNOSIS — R000 Tachycardia, unspecified: Secondary | ICD-10-CM | POA: Insufficient documentation

## 2022-07-10 DIAGNOSIS — I10 Essential (primary) hypertension: Secondary | ICD-10-CM | POA: Insufficient documentation

## 2022-07-10 DIAGNOSIS — R252 Cramp and spasm: Secondary | ICD-10-CM | POA: Insufficient documentation

## 2022-07-10 MED ORDER — METOPROLOL TARTRATE 25 MG PO TABS: 25.0000 mg | ORAL_TABLET | Freq: Two times a day (BID) | ORAL | 3 refills | Status: DC

## 2022-07-10 NOTE — Progress Notes (Signed)
Subjective:    Patient ID: [name], male    DOB: 03/16/1956, 66 y.o.   MRN: [id number]  HPI  34) Debility [name] is a 66 year old man admitted to CIR with debility, who presents for follow-up of his post-stroke spasticity.  His caregiver reports that his left side continues to be weak -He has been going very well at his group home. Accompanied by a staff member today.  -He used to walk every day, but sometimes his weakness inhibits him -he is no longer receiving any therapy.  -walking very well as per patient.  -he is willing to do more physical therapy.  -he has been doing well at home but his aide reports that he sleeps during most of the day -does not note any more falls  2) HTN -elevated today -HR is also elevated -he has been taking lopressor 12.5mg  BID.  -usually 150s/70s or 80s.   3) Constipation -having regular BM -taking colace BID PRN  He has been ambulating with RW  He has followed with neurology and requires no medication refills.  He recently received left hand and foot orthotics- wearing AFO now and uses wrist splint at night  Denies pain   Pain Inventory Average Pain 0 Pain Right Now 0 My pain is  No Pain her for Stroke follow up  LOCATION OF PAIN  No pain  BOWEL Number of stools per week: 7 Oral laxative use Yes  Type of laxative Miralax Enema or suppository use No  History of colostomy No  Incontinent No   BLADDER Normal In and out cath, frequency N/A Able to self cath No  Bladder incontinence No  Frequent urination No  Leakage with coughing No  Difficulty starting stream No  Incomplete bladder emptying No    Mobility use a walker how many minutes can you walk? 5-10 ability to climb steps?  yes do you drive?  no needs help with transfers  Function retired I need assistance with the following:  dressing, bathing, toileting, meal prep, household duties and shopping  Neuro/Psych trouble walking    Family  History  Problem Relation Age of Onset   Breast cancer Mother    Heart disease Father    Social History   Socioeconomic History   Marital status: Single    Spouse name: Not on file   Number of children: 0   Years of education: 12   Highest education level: High school graduate  Occupational History   Occupation: Disabled  Tobacco Use   Smoking status: Never   Smokeless tobacco: Never  Vaping Use   Vaping Use: Never used  Substance and Sexual Activity   Alcohol use: No   Drug use: Never   Sexual activity: Never  Other Topics Concern   Not on file  Social History Narrative   Lives at [facility] (ph: [phone]).   Left-handed.   No daily caffeine use.   Social Determinants of Health   Financial Resource Strain: Not on file  Food Insecurity: Not on file  Transportation Needs: Not on file  Physical Activity: Not on file  Stress: Not on file  Social Connections: Not on file   Past Surgical History:  Procedure Laterality Date   BIOPSY  10/21/2020   Procedure: BIOPSY;  Surgeon: Gatha Mayer, MD;  Location: [place];  Service: Endoscopy;;   CRANIOTOMY FOR CYST FENESTRATION     ESOPHAGOGASTRODUODENOSCOPY N/A 01/09/2015   Procedure: ESOPHAGOGASTRODUODENOSCOPY (EGD);  Surgeon: Inda Castle,  MD;  Location: [place] [facility];  Service: Endoscopy;  Laterality: N/A;   ESOPHAGOGASTRODUODENOSCOPY N/A 10/26/2020   Procedure: ESOPHAGOGASTRODUODENOSCOPY (EGD);  Surgeon: Lajuana Matte, MD;  [facility];  Service: Thoracic;  Laterality: N/A;   ESOPHAGOGASTRODUODENOSCOPY (EGD) WITH PROPOFOL N/A 10/21/2020   Procedure: ESOPHAGOGASTRODUODENOSCOPY (EGD) WITH PROPOFOL;  Surgeon: Gatha Mayer, MD;  Location: [place];  Service: Endoscopy;  Laterality: N/A;   IR GASTR TUBE CONVERT GASTR-JEJ PER W/FL MOD SED  11/07/2020   PEG PLACEMENT N/A 10/26/2020   Procedure: PERCUTANEOUS ENDOSCOPIC GASTROSTOMY (PEG) PLACEMENT;  Surgeon: Lajuana Matte, MD;  Location: [place];   Service: Thoracic;  Laterality: N/A;   XI ROBOTIC ASSISTED HIATAL HERNIA REPAIR N/A 10/26/2020   Procedure: XI ROBOTIC ASSISTED HIATAL HERNIA REPAIR;  Surgeon: Lajuana Matte, MD;  Location: [place];  Service: Thoracic;  Laterality: N/A;   Past Medical History:  Diagnosis Date   Barrett esophagus    DM II (diabetes mellitus, type II), controlled ([place]) 11/13/2020   Dysphagia    Gastroparesis    GERD (gastroesophageal reflux disease)    Hypercholesteremia    Hypertension    Iron deficiency anemia    Mental retardation    Schizophrenia ([place])    Seizure ([place])    Stroke ([place])    BP (!) 154/75   Pulse 100   Ht 5\' 4"  (1.626 m)   Wt 143 lb 3.2 oz (65 kg)   SpO2 92%   BMI 24.58 kg/m   Opioid Risk Score:   Fall Risk Score:  `1  Depression screen [facility] 2/9     07/10/2022   11:03 AM 01/02/2022   10:23 AM 09/19/2021   11:03 AM 05/30/2021    9:21 AM 03/28/2021   11:46 AM  Depression screen PHQ 2/9  Decreased Interest 0 0 2 2 2   Down, Depressed, Hopeless 0 0 2 2 2   PHQ - 2 Score 0 0 4 4 4   Altered sleeping     0  Tired, decreased energy     1  Change in appetite     0  Feeling bad or failure about yourself      0  Trouble concentrating     1  Moving slowly or fidgety/restless     0  Suicidal thoughts     0  PHQ-9 Score     6   Review of Systems  Constitutional: Negative.   HENT: Negative.    Eyes: Negative.   Respiratory: Negative.    Cardiovascular: Negative.   Gastrointestinal: Negative.   Endocrine: Negative.   Genitourinary: Negative.   Musculoskeletal:  Positive for gait problem.  Skin: Negative.   Allergic/Immunologic: Negative.   Neurological:  Positive for speech difficulty.  Hematological: Negative.   Psychiatric/Behavioral:  Positive for dysphoric mood.   All other systems reviewed and are negative.     Objective:   Physical Exam Gen: no distress, normal appearing HEENT: oral mucosa pink and moist, NCAT Cardio: Reg rate Chest: normal effort, normal  rate of breathing Abd: soft, non-distended Ext: no edema Psych: pleasant, normal affect Skin: intact Neuro: Alert, but often unable to answer questions on his own Musculoskeletal: left side decreased strength, 2/5 wrist extension, 3/5 EE and EE. 0/5 PF. Wearing AFO. MAS 1 in left EF, WF, KF. In wheelchair     Assessment & Plan:  Mr. Batton is a 66 year old man who presents for hospital follow-up after CIR admission for debility.   1) Debility: -given loss of  balance, weakness, and spasticity, he would benefit from refreshed of PT and OT- indicated in recommendations section of his sheet -continue daily walk and incorporate daily resistance training- discussed squats and pushups and lifting light weights  2) post-stroke spasticity: -emphasized the importance of daily stretching -d/c baclofen -no need for more Botox -recommended using heat to relax muscles.   3) post-stroke weakness -Continue left wrist WHO and left AFO.  -restart therapy  3) HTN: -increase lopressor to 15mg  BID -Advised checking BP daily at home and logging results to bring into follow-up appointment with her PCP and myself. -Reviewed BP meds today.  -Advised regarding healthy foods that can help lower blood pressure and provided with a list: 1) citrus foods- high in vitamins and minerals 2) salmon and other fatty fish - reduces inflammation and oxylipins 3) swiss chard (leafy green)- high level of nitrates 4) pumpkin seeds- one of the best natural sources of magnesium 5) Beans and lentils- high in fiber, magnesium, and potassium 6) Berries- high in flavonoids 7) Amaranth (whole grain, can be cooked similarly to rice and oats)- high in magnesium and fiber 8) Pistachios- even more effective at reducing BP than other nuts 9) Carrots- high in phenolic compounds that relax blood vessels and reduce inflammation 10) Celery- contain phthalides that relax tissues of arterial walls 11) Tomatoes- can also improve  cholesterol and reduce risk of heart disease 12) Broccoli- good source of magnesium, calcium, and potassium 13) Greek yogurt: high in potassium and calcium 14) Herbs and spices: Celery seed, cilantro, saffron, lemongrass, black cumin, ginseng, cinnamon, cardamom, sweet basil, and ginger 15) Chia and flax seeds- also help to lower cholesterol and blood sugar 16) Beets- high levels of nitrates that relax blood vessels  17) spinach and bananas- high in potassium  -Provided lise of supplements that can help with hypertension:  1) magnesium: one high quality brand is Bioptemizers since it contains all 7 types of magnesium, otherwise over the counter magnesium gluconate 400mg  is a good option 2) B vitamins 3) vitamin D 4) potassium 5) CoQ10 6) L-arginine 7) Vitamin C 8) Beetroot -Educated that goal BP is 120/80. -Made goal to incorporate some of the above foods into diet.    4) Constipation:  -Provided list of following foods that help with constipation and highlighted a few: 1) prunes- contain high amounts of fiber.  2) apples- has a form of dietary fiber called pectin that accelerates stool movement and increases beneficial gut bacteria 3) pears- in addition to fiber, also high in fructose and sorbitol which have laxative effect 4) figs- contain an enzyme ficin which helps to speed colonic transit 5) kiwis- contain an enzyme actinidin that improves gut motility and reduces constipation 6) oranges- rich in pectin (like apples) 7) grapefruits- contain a flavanol naringenin which has a laxative effect 8) vegetables- rich in fiber and also great sources of folate, vitamin C, and K 9) artichoke- high in inulin, prebiotic great for the microbiome 10) chicory- increases stool frequency and softness (can be added to coffee) 11) rhubarb- laxative effect 12) sweet potato- high fiber 13) beans, peas, and lentils- contain both soluble and insoluble fiber 14) chia seeds- improves intestinal health  and gut flora 15) flaxseeds- laxative effect 16) whole grain rye bread- high in fiber 17) oat bran- high in soluble and insoluble fiber 18) kefir- softens stools -recommended to try at least one of these foods every day.  -drink 6-8 glasses of water per day -walk regularly, especially after meals.  5) Tachycardia: -increase lopressor to 25mg  BID

## 2022-07-10 NOTE — Patient Instructions (Signed)
Constipation:  -Provided list of following foods that help with constipation and highlighted a few: 1) prunes- contain high amounts of fiber.  2) apples- has a form of dietary fiber called pectin that accelerates stool movement and increases beneficial gut bacteria 3) pears- in addition to fiber, also high in fructose and sorbitol which have laxative effect 4) figs- contain an enzyme ficin which helps to speed colonic transit 5) kiwis- contain an enzyme actinidin that improves gut motility and reduces constipation 6) oranges- rich in pectin (like apples) 7) grapefruits- contain a flavanol naringenin which has a laxative effect 8) vegetables- rich in fiber and also great sources of folate, vitamin C, and K 9) artichoke- high in inulin, prebiotic great for the microbiome 10) chicory- increases stool frequency and softness (can be added to coffee) 11) rhubarb- laxative effect 12) sweet potato- high fiber 13) beans, peas, and lentils- contain both soluble and insoluble fiber 14) chia seeds- improves intestinal health and gut flora 15) flaxseeds- laxative effect 16) whole grain rye bread- high in fiber 17) oat bran- high in soluble and insoluble fiber 18) kefir- softens stools -recommended to try at least one of these foods every day.  -drink 6-8 glasses of water per day -walk regularly, especially after meals.      

## 2022-07-12 ENCOUNTER — Telehealth: Payer: Self-pay

## 2022-07-12 NOTE — Telephone Encounter (Signed)
Please call Tabitha RN with [facility] called: She would like to know why the Lopresor has been decreased? Also questions about Botox.  Tabitha RN has been advised to call back with a direct phone number or leave a more detailed message.    Call back phone [phone].

## 2022-09-18 IMAGING — DX DG ABDOMEN 1V
2 series · 2 of 2 positions shown · non-contrast
Comparison: 10/24/2020

CLINICAL DATA: Abdominal distension

EXAM:
ABDOMEN - 1 VIEW

[abdomen kub (1 of 2)]
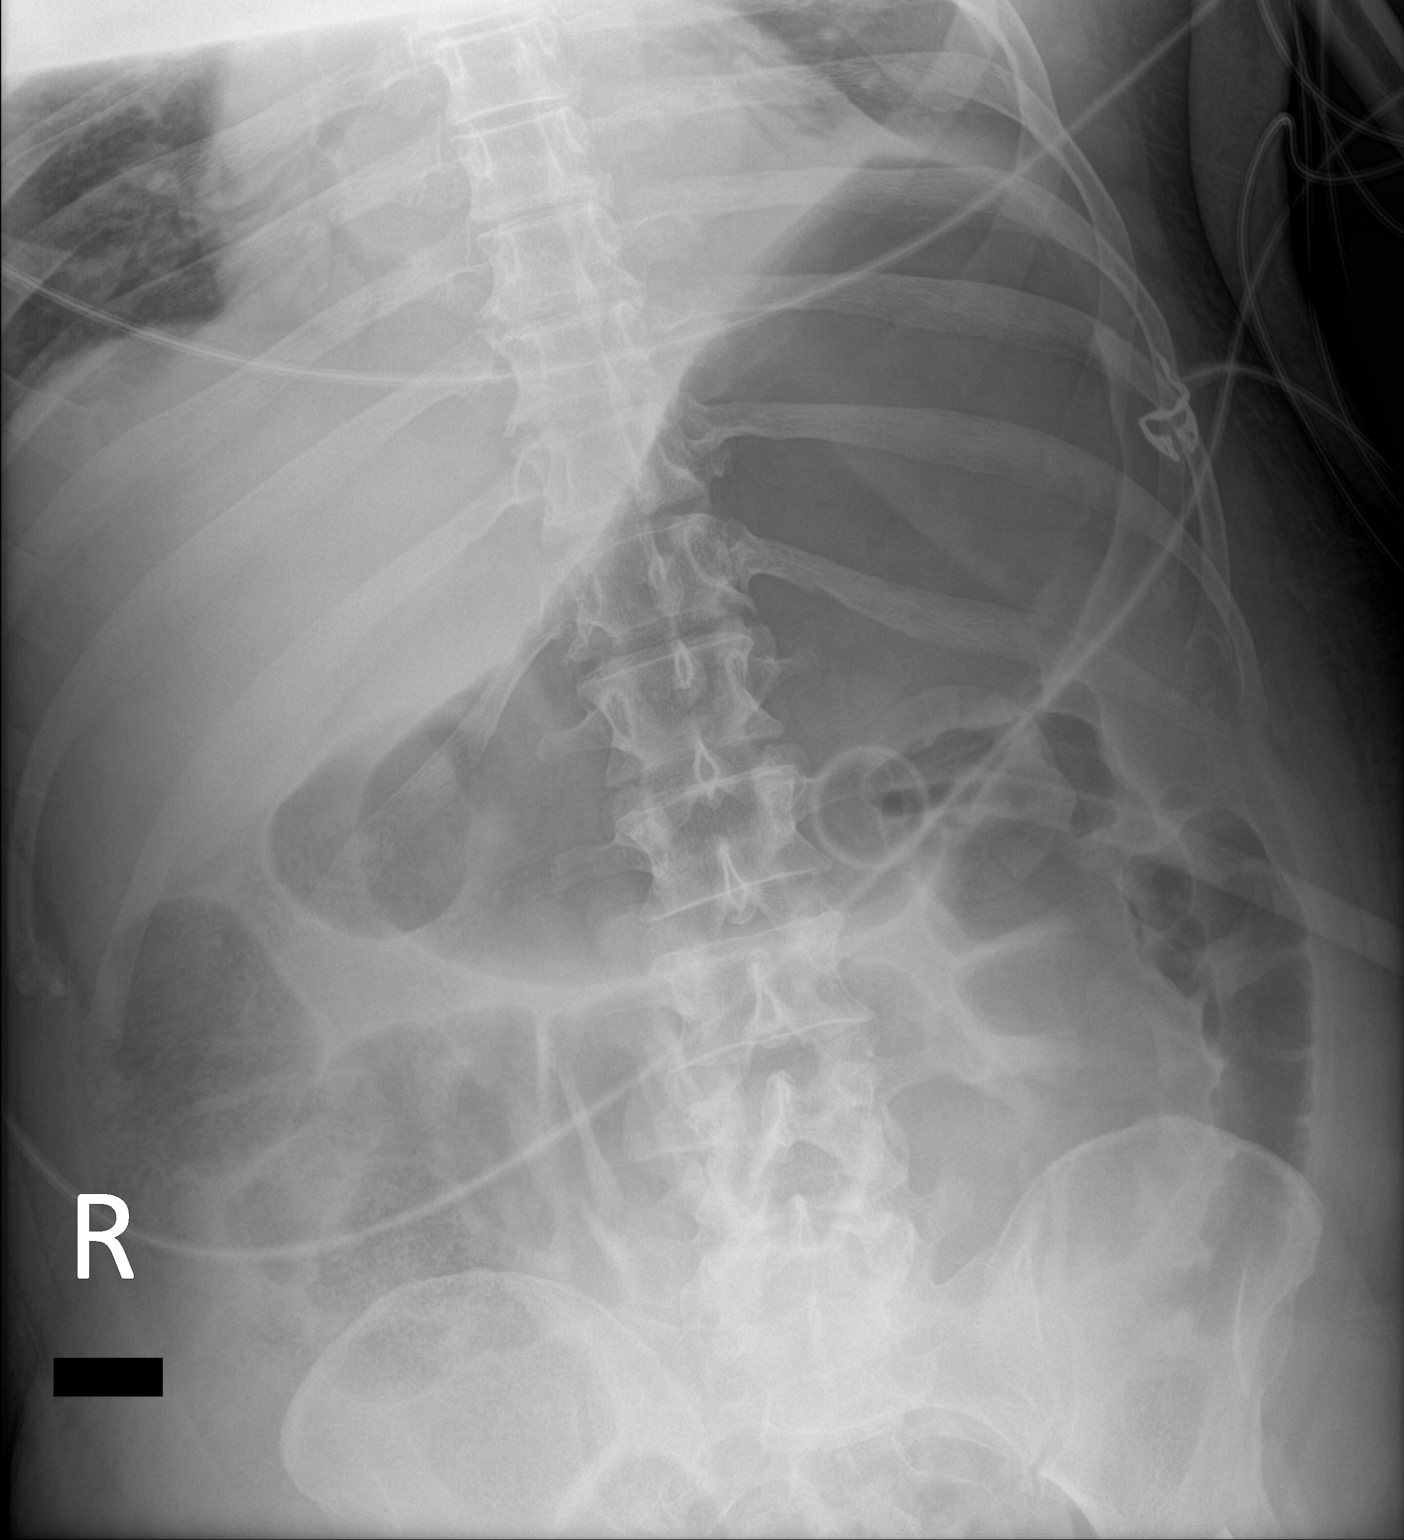

[abdomen kub (2 of 2)]
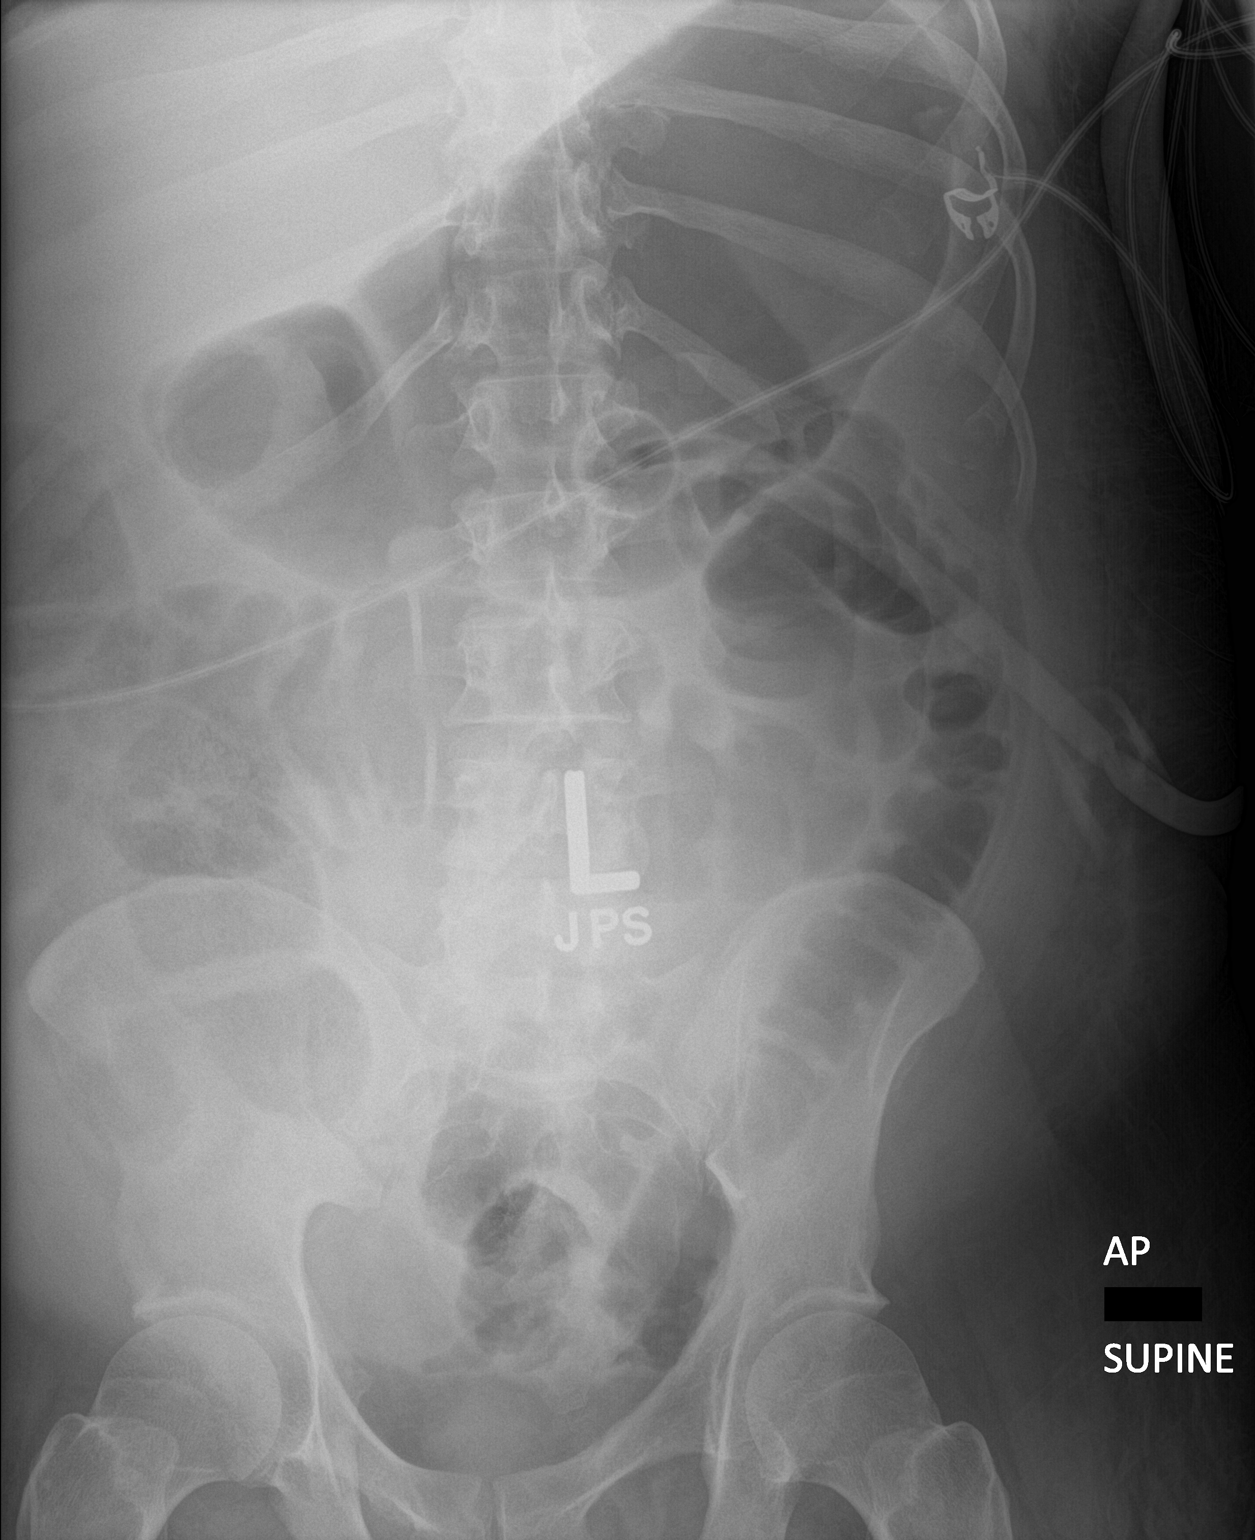

[2 of 2 positions shown; findings below may reference images not displayed]

FINDINGS: Gastrostomy catheter is now seen in place. Previously noted feeding
catheter is been removed. Scattered large and small bowel gas is
noted. The stomach is significantly distended. No free air is noted.
IMPRESSION: Gaseous distension of the stomach.

Gastrostomy catheter in place.

## 2022-09-20 IMAGING — DX DG CHEST 1V PORT
1 series · 1 of 1 positions shown · non-contrast
Comparison: Chest x-ray 11/02/2020

CLINICAL DATA: Respiratory failure.

EXAM:
PORTABLE CHEST 1 VIEW

[chest ap]
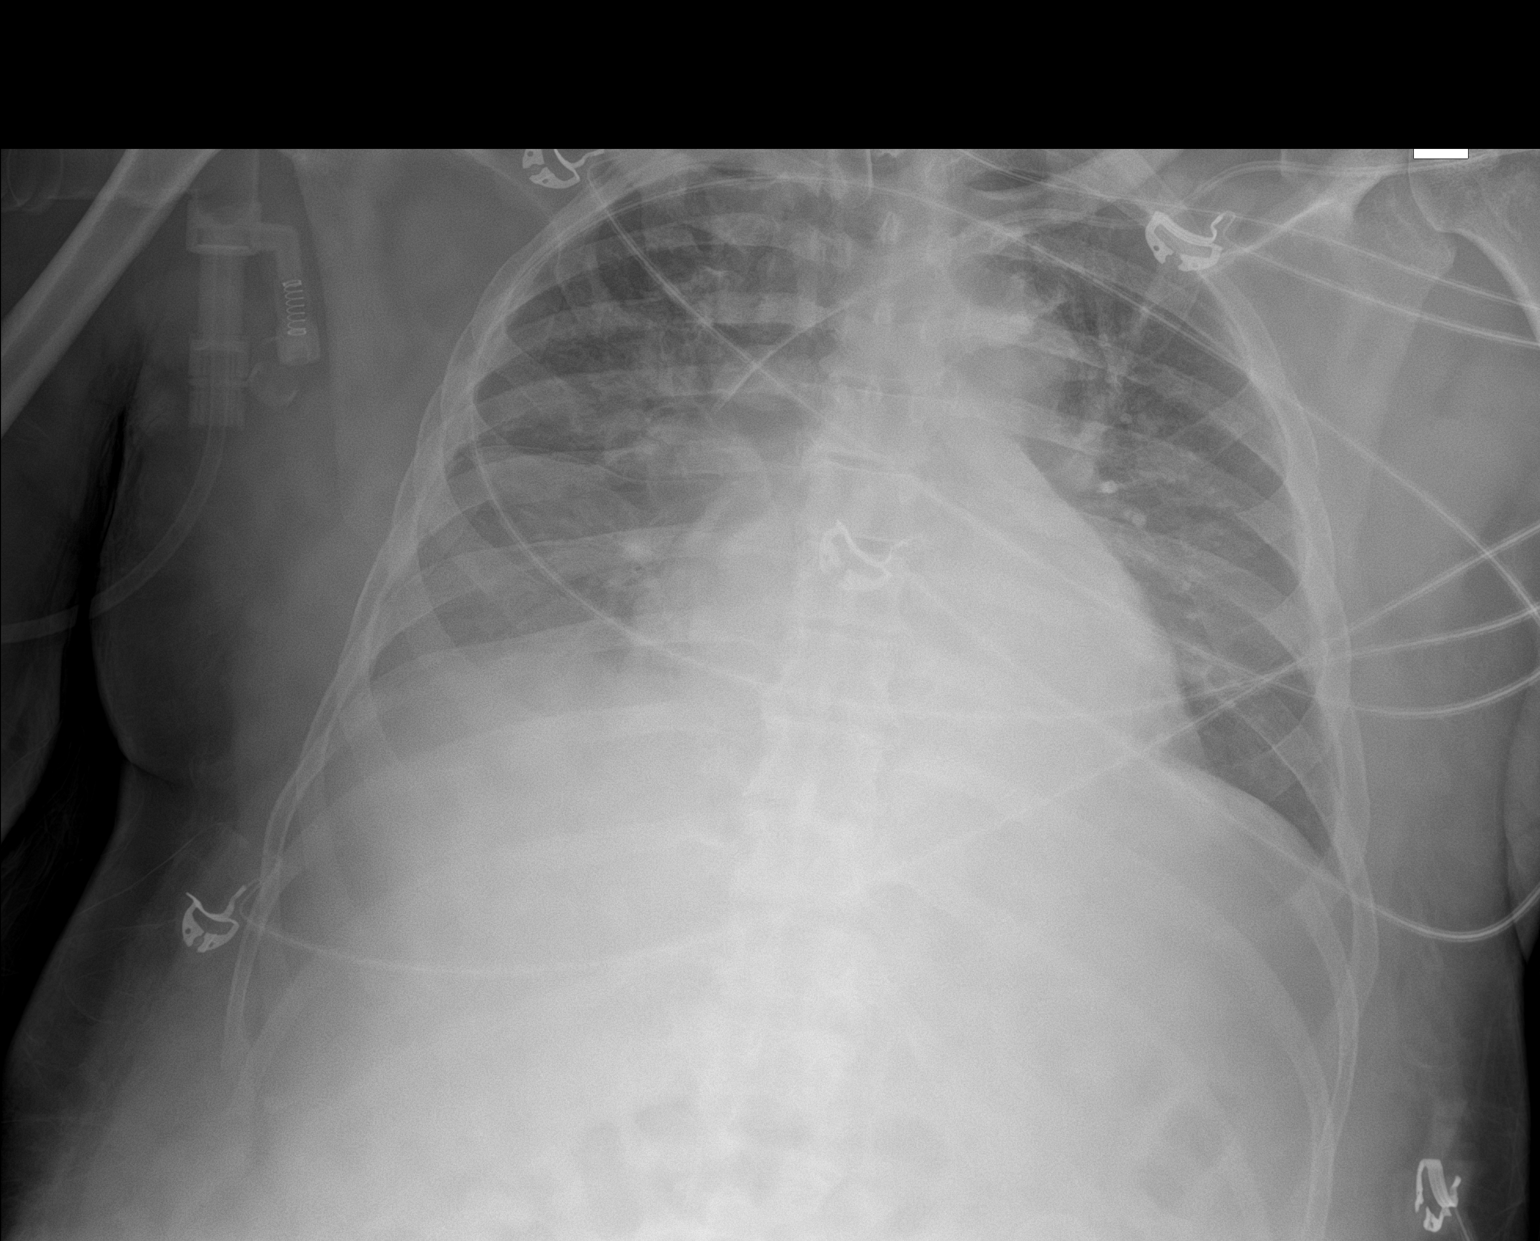

[1 of 1 positions shown; findings below may reference images not displayed]

FINDINGS: The endotracheal tube is 5.4 cm above the carina.

The left subclavian central venous catheter is stable.

The cardiac silhouette, mediastinal and hilar contours are stable.

Persistent right lower lobe infiltrate and probable small effusion.
Slight improved left basilar aeration when compared to prior study.
No definite pneumothorax.
IMPRESSION: 1. Stable support apparatus.
2. Persistent right lower lobe infiltrate and probable small
effusion.

## 2022-09-23 IMAGING — XA IR CONVERT G-TUBE TO G-JTUBE
2 series · 7 of 7 positions shown · non-contrast
Comparison: none

INDICATION: 63-year-old male with history of recently surgically placed
gastrostomy tube now with gastro paresis, therefore jejunal arm
extension is requested for feeds.

[Series 2: fl angio · 4 of 101 frames shown]
[frame 13/101]
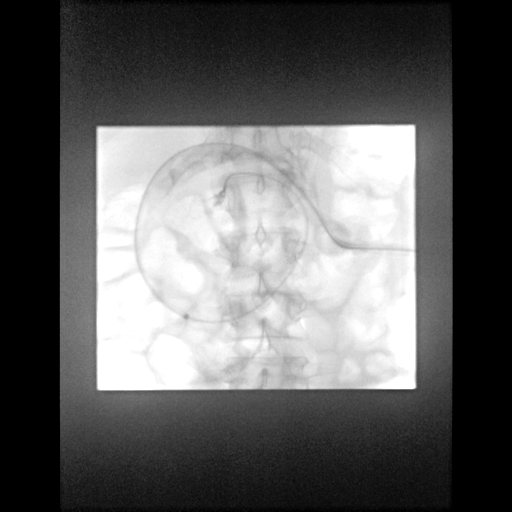
[frame 16/101]
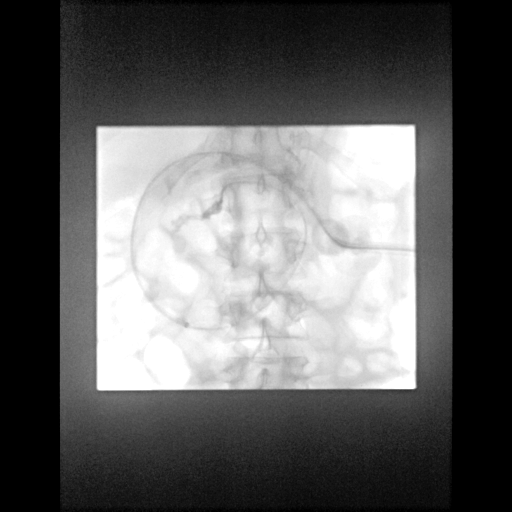
[frame 51/101]
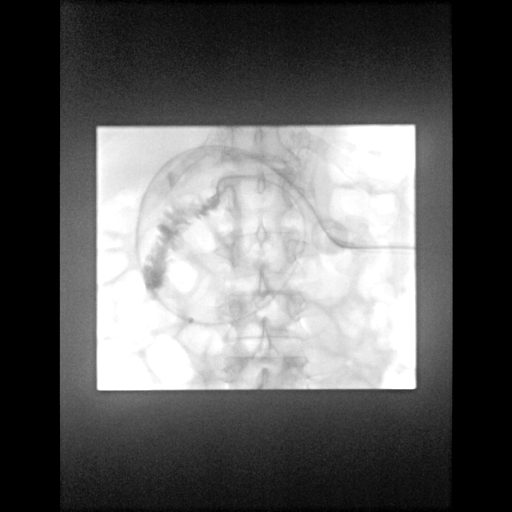
[frame 86/101]
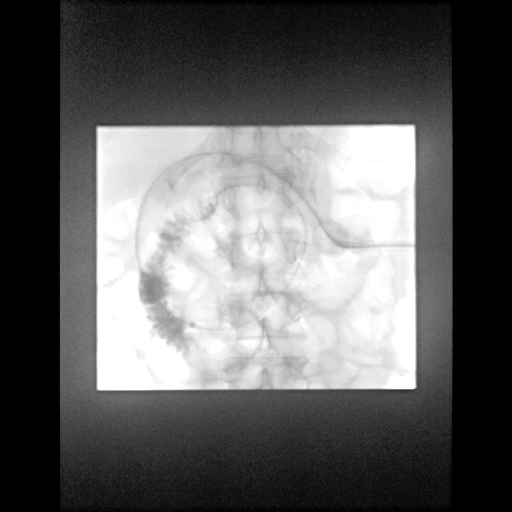

[Series 300: ir duoden/jejuno tube insert percut w/fl · 3 of 3 slices shown]
[im 1/3]
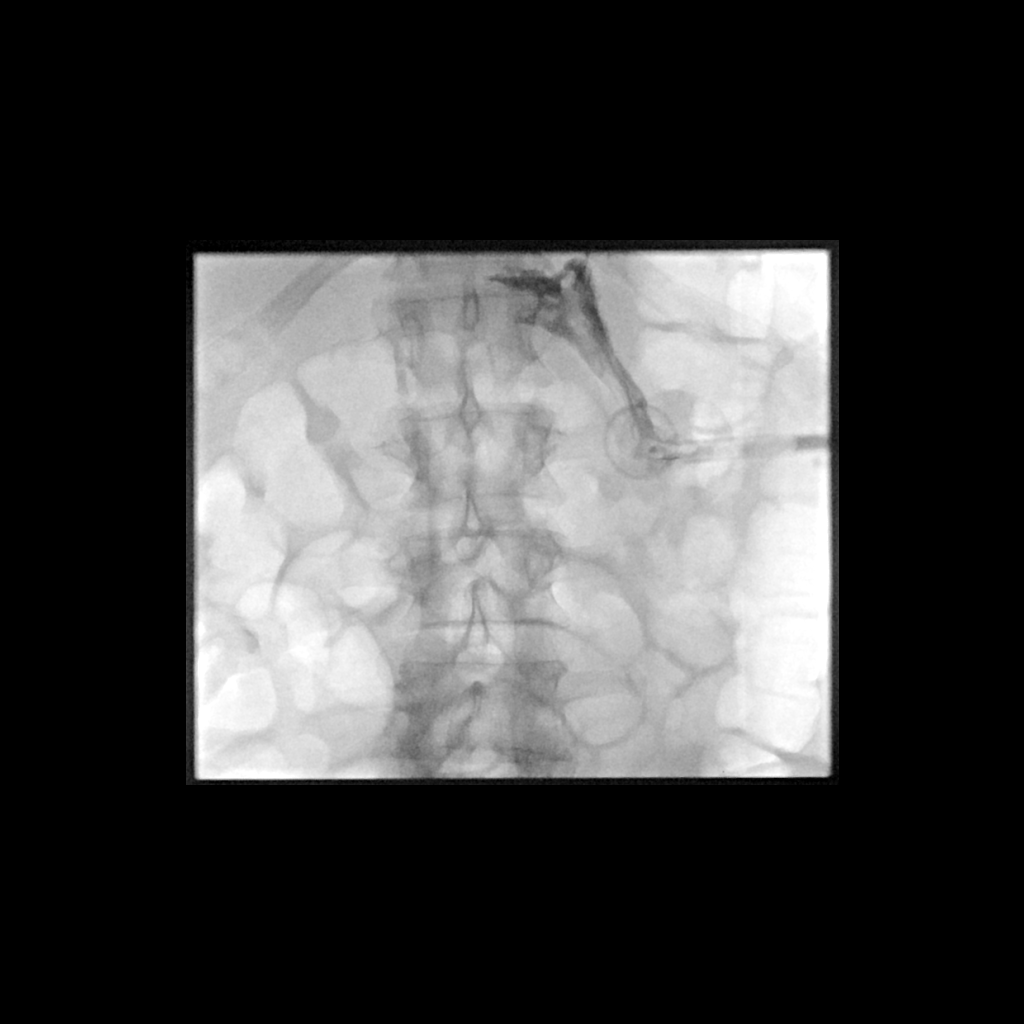
[im 2/3]
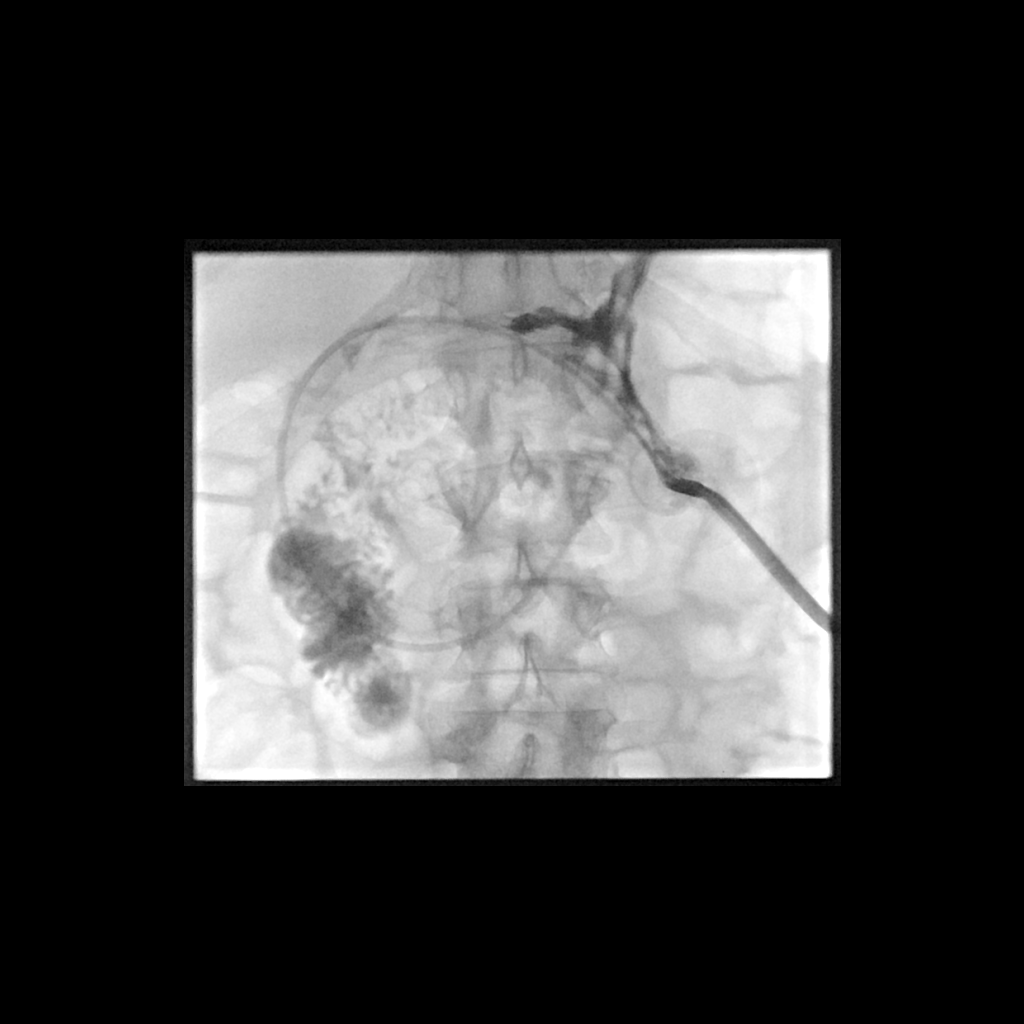
[im 3/3]
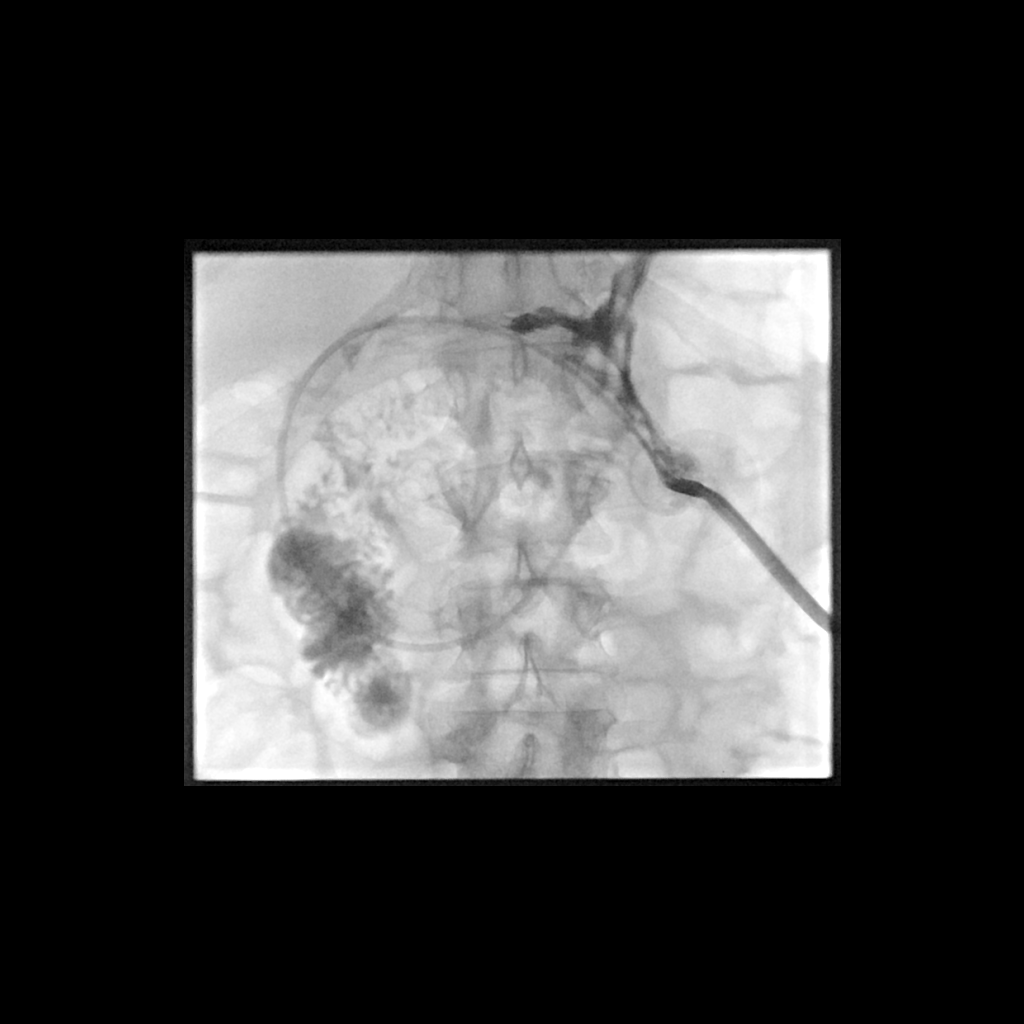

[7 of 7 positions shown; findings below may reference images not displayed]

EXAM:
CONVERT G-TUBE TO G-JTUBE

MEDICATIONS:
None.

ANESTHESIA/SEDATION:
None.

CONTRAST:  15mL OMNIPAQUE IOHEXOL 300 MG/ML SOLN - administered into
the gastric lumen.

FLUOROSCOPY TIME:  Fluoroscopy Time: 5 minutes 12 seconds (114 mGy).

COMPLICATIONS:
None immediate.

PROCEDURE:
Informed written consent was obtained from the patient after a
thorough discussion of the procedural risks, benefits and
alternatives. All questions were addressed. Maximal Sterile Barrier
Technique was utilized including caps, mask, sterile gowns, sterile
gloves, sterile drape, hand hygiene and skin antiseptic. A timeout
was performed prior to the initiation of the procedure.

Gentle hand injection via the indwelling gastrostomy demonstrated
appropriate position within the gastric lumen. The excess length of
the external portion of the gastrostomy tube was cut. A stiff
Glidewire was advanced into the gastric lumen but unable to be
position within the duodenum. Therefore, over the Glidewire a 7
French flexor angled tip sheath was placed was was directed through
the pylorus into the first portion the duodenum. The Glidewire then
was able to be passed to the proximal jejunum over which, in a
coaxial fashion an angled tip catheter followed. The Glidewire was
removed and exchanged for a stiff Amplatz wire. Over the Amplatz
wire, a 9 French GJ conversion kit was inserted through the
indwelling gastrostomy tube. Contrast injection demonstrated the tip
of the gastro jejunal tube in the proximal jejunum, just past the
ligament of Treitz. Additional injection through the indwelling
gastrostomy port demonstrated appropriate flow into the gastric
lumen. The ports were flushed with saline. The patient tolerated the
procedure well without complication
IMPRESSION: Successful insertion of a 9 French gastrojejunal limb through the
indwelling gastrostomy tube. The tip of the jejunal arm is in the
proximal jejunum, just distal to the ligament of Treitz.

## 2023-03-15 ENCOUNTER — Emergency Department (HOSPITAL_COMMUNITY): Payer: Medicare Other

## 2023-03-15 ENCOUNTER — Emergency Department (HOSPITAL_COMMUNITY)
Admission: EM | Admit: 2023-03-15 | Discharge: 2023-03-16 | Disposition: A | Discharge status: Home / Self Care | Payer: Medicare Other | Attending: Emergency Medicine | Admitting: Emergency Medicine

## 2023-03-15 ENCOUNTER — Other Ambulatory Visit: Payer: Self-pay

## 2023-03-15 DIAGNOSIS — W1830XA Fall on same level, unspecified, initial encounter: Secondary | ICD-10-CM | POA: Insufficient documentation

## 2023-03-15 DIAGNOSIS — Z79899 Other long term (current) drug therapy: Secondary | ICD-10-CM | POA: Diagnosis not present

## 2023-03-15 DIAGNOSIS — S300XXA Contusion of lower back and pelvis, initial encounter: Secondary | ICD-10-CM | POA: Insufficient documentation

## 2023-03-15 DIAGNOSIS — M549 Dorsalgia, unspecified: Secondary | ICD-10-CM | POA: Diagnosis present

## 2023-03-15 DIAGNOSIS — I10 Essential (primary) hypertension: Secondary | ICD-10-CM | POA: Insufficient documentation

## 2023-03-15 DIAGNOSIS — E119 Type 2 diabetes mellitus without complications: Secondary | ICD-10-CM | POA: Diagnosis not present

## 2023-03-15 DIAGNOSIS — W228XXA Striking against or struck by other objects, initial encounter: Secondary | ICD-10-CM | POA: Diagnosis not present

## 2023-03-15 LAB — TSH: TSH: 3.665 u[IU]/mL (ref 0.350–4.500)

## 2023-03-15 LAB — URINALYSIS, ROUTINE W REFLEX MICROSCOPIC
Bilirubin Urine: NEGATIVE
Glucose, UA: NEGATIVE mg/dL
Hgb urine dipstick: NEGATIVE
Ketones, ur: NEGATIVE mg/dL
Leukocytes,Ua: NEGATIVE
Nitrite: NEGATIVE
Protein, ur: NEGATIVE mg/dL
Specific Gravity, Urine: 1.008 (ref 1.005–1.030)
pH: 7 (ref 5.0–8.0)

## 2023-03-15 LAB — COMPREHENSIVE METABOLIC PANEL
ALT: 15 U/L (ref 0–44)
AST: 23 U/L (ref 15–41)
Albumin: 3.8 g/dL (ref 3.5–5.0)
Alkaline Phosphatase: 58 U/L (ref 38–126)
Anion gap: 9 (ref 5–15)
BUN: 14 mg/dL (ref 8–23)
CO2: 25 mmol/L (ref 22–32)
Calcium: 8.9 mg/dL (ref 8.9–10.3)
Chloride: 103 mmol/L (ref 98–111)
Creatinine, Ser: 1.12 mg/dL (ref 0.61–1.24)
GFR, Estimated: >60 mL/min (ref >60)
Glucose, Bld: 166 mg/dL — ABNORMAL HIGH (ref 70–99)
Potassium: 3.5 mmol/L (ref 3.5–5.1)
Sodium: 137 mmol/L (ref 135–145)
Total Bilirubin: 0.4 mg/dL (ref 0.3–1.2)
Total Protein: 7.2 g/dL (ref 6.5–8.1)

## 2023-03-15 LAB — CBC
HCT: 41.8 % (ref 39.0–52.0)
Hemoglobin: 13.6 g/dL (ref 13.0–17.0)
MCH: 31.1 pg (ref 26.0–34.0)
MCHC: 32.5 g/dL (ref 30.0–36.0)
MCV: 95.7 fL (ref 80.0–100.0)
Platelets: 205 10*3/uL (ref 150–400)
RBC: 4.37 MIL/uL (ref 4.22–5.81)
RDW: 11.9 % (ref 11.5–15.5)
WBC: 5.5 10*3/uL (ref 4.0–10.5)
nRBC: 0 % (ref 0.0–0.2)

## 2023-03-15 LAB — VALPROIC ACID LEVEL: Valproic Acid Lvl: 43 ug/mL — ABNORMAL LOW (ref 50.0–100.0)

## 2023-03-15 MED ORDER — METOPROLOL TARTRATE 25 MG PO TABS
25.0000 mg | ORAL_TABLET | Freq: Once | ORAL | Status: AC
  Administered 2023-03-15: 25 mg via ORAL
  Filled 2023-03-15: qty 1

## 2023-03-15 MED ORDER — QUETIAPINE FUMARATE 300 MG PO TABS
400.0000 mg | ORAL_TABLET | ORAL | Status: AC
  Administered 2023-03-15: 400 mg via ORAL
  Filled 2023-03-15: qty 1

## 2023-03-15 MED ORDER — DIVALPROEX SODIUM 500 MG PO DR TAB
500.0000 mg | DELAYED_RELEASE_TABLET | ORAL | Status: AC
  Administered 2023-03-15: 500 mg via ORAL
  Filled 2023-03-15: qty 1

## 2023-03-15 NOTE — ED Triage Notes (Signed)
Triage RN spoke with POA [name] at [phone] d/t hospital visit. POA endorses concern for bruise to right lower back and requests labs work d/t pt taking several medications.

## 2023-03-15 NOTE — ED Notes (Signed)
Pt placed on 2 lpm [place] after O2 noted to be 77% on room air. Pt O2 sat came up to 85% and O2 was increased to 4lpm.

## 2023-03-15 NOTE — Discharge Instructions (Addendum)
I am reassured by your lab work and urine. Your bruise should fade over the next week.   Return to ER for any new or concerning symptoms as needed. Otherwise followup with PCP  Please use Tylenol or ibuprofen for pain.  You may use 600 mg ibuprofen every 6 hours or 1000 mg of Tylenol every 6 hours.  You may choose to alternate between the 2.  This would be most effective.  Not to exceed 4 g of Tylenol within 24 hours.  Not to exceed 3200 mg ibuprofen 24 hours.

## 2023-03-15 NOTE — ED Triage Notes (Signed)
Pt BIB group home caregiver per POA sister request [name] for evaluation of a bruise to right upper back. Pt endorses fall approx 4 days ago. Denies pain. Denies any other concerns. Pt ambulatory with walker.

## 2023-03-15 NOTE — ED Provider Notes (Signed)
[place] EMERGENCY DEPARTMENT AT [facility] Provider Note   CSN: [id number] Arrival date & time: 03/15/23  1850     History  Chief Complaint  Patient presents with   Back Pain    [name] is a 67 y.o. male with history of diabetes, Barrett's esophagus, gastroparesis, GERD, hyperlipidemia, hypertension, iron deficiency anemia, mental retardation, schizophrenia, seizure on Depakote and Lamictal who presents the emergency department at request of his sister, who is his healthcare power of attorney.  Discussed with sister Jackelyn Poling [phone]). Sister went to visit him at the group home yesterday and noticed bruising on his back, patient seemed more agitated, tried to hit her when she was trying to look at the bruises. Worsening hygiene. Group home has alarms on his chair and on his bed, and no alarm went off to indicate that he fell, but patient was complaining he fell.   Patient himself has no complaints.   Level 5 caveat due to cognitive delay   Back Pain      Home Medications Prior to Admission medications   Medication Sig Start Date End Date Taking? Authorizing Provider  Cholecalciferol (VITAMIN D) 50 MCG (2000 UT) tablet Take 1 tablet (2,000 Units total) by mouth daily. 12/06/20   Angiulli, Lavon Paganini, PA-C  divalproex (DEPAKOTE) 500 MG DR tablet Take 1 tablet (500 mg total) by mouth every 12 (twelve) hours. 12/07/20   Angiulli, Lavon Paganini, PA-C  docusate sodium (COLACE) 100 MG capsule Take 1 capsule (100 mg total) by mouth 2 (two) times daily. 12/06/20   Angiulli, Lavon Paganini, PA-C  Emollient (CORN HUSKERS EX) Apply 1 application topically in the morning and at bedtime. For dry skin    [provider]  lamoTRIgine (LAMICTAL) 100 MG tablet Take 1 tablet (100 mg total) by mouth daily. 12/07/20   Angiulli, Lavon Paganini, PA-C  levothyroxine (SYNTHROID) 75 MCG tablet Take 1 tablet (75 mcg total) by mouth daily at 6 (six) AM. 12/07/20   Angiulli, Lavon Paganini,  PA-C  metoprolol tartrate (LOPRESSOR) 25 MG tablet Take 1 tablet (25 mg total) by mouth 2 (two) times daily. 07/10/22   Raulkar, Clide Deutscher, MD  ondansetron (ZOFRAN) 4 MG tablet Take 4 mg by mouth 4 (four) times daily as needed for nausea or vomiting.    [provider]  pantoprazole (PROTONIX) 40 MG tablet Take 1 tablet (40 mg total) by mouth 2 (two) times daily before a meal. 12/06/20   Angiulli, Lavon Paganini, PA-C  polyethylene glycol (MIRALAX / GLYCOLAX) 17 g packet Take 17 g by mouth 2 (two) times daily. 12/06/20   Angiulli, Lavon Paganini, PA-C  QUEtiapine (SEROQUEL) 400 MG tablet Take 1 tablet (400 mg total) by mouth 2 (two) times daily. 12/06/20   Angiulli, Lavon Paganini, PA-C  rosuvastatin (CRESTOR) 20 MG tablet Take 1 tablet (20 mg total) by mouth daily. 12/07/20   Angiulli, Lavon Paganini, PA-C      Allergies    Vancomycin    Review of Systems   Review of Systems  Unable to perform ROS: Other (Cognitive delay)    Physical Exam Updated Vital Signs BP (!) 146/100   Pulse 82   Temp 98.2 F (36.8 C) (Oral)   Resp 19   SpO2 94%  Physical Exam Vitals and nursing note reviewed.  Constitutional:      Appearance: Normal appearance.  HENT:     Head: Normocephalic and atraumatic.  Eyes:     Conjunctiva/sclera: Conjunctivae normal.  Cardiovascular:  Rate and Rhythm: Normal rate and regular rhythm.  Pulmonary:     Effort: Pulmonary effort is normal. No respiratory distress.     Breath sounds: Normal breath sounds.  Abdominal:     General: There is no distension.     Palpations: Abdomen is soft.     Tenderness: There is no abdominal tenderness.  Musculoskeletal:     Comments: No midline spinal tenderness, step-offs or crepitus.  Ranging all extremities without difficulty.  Compartments of the extremities are soft. No other traumatic findings.   Skin:    General: Skin is warm and dry.          Comments: Partially healing ecchymoses noted over right lower back, non-tender   Neurological:     General: No focal deficit present.     Mental Status: He is alert.     ED Results / Procedures / Treatments   Labs (all labs ordered are listed, but only abnormal results are displayed) Labs Reviewed  COMPREHENSIVE METABOLIC PANEL - Abnormal; Notable for the following components:      Result Value   Glucose, Bld 166 (*)    All other components within normal limits  VALPROIC ACID LEVEL - Abnormal; Notable for the following components:   Valproic Acid Lvl 43 (*)    All other components within normal limits  CBC  TSH  T4, FREE  LAMOTRIGINE LEVEL  URINALYSIS, ROUTINE W REFLEX MICROSCOPIC    EKG EKG Interpretation  Date/Time:  Friday March 15 2023 19:44:12 EDT Ventricular Rate:  96 PR Interval:  152 QRS Duration: 81 QT Interval:  333 QTC Calculation: 421 R Axis:   41 Text Interpretation: Sinus rhythm Nonspecific T abnormalities, lateral leads Confirmed by Lacretia Leigh ([id number]) on 03/15/2023 8:41:49 PM  Radiology No results found.  Procedures Procedures    Medications Ordered in ED Medications - No data to display  ED Course/ Medical Decision Making/ A&P Clinical Course as of 03/15/23 2211  Fri Mar 15, 2023  2124 Per ED medic: "Pt placed on 2 lpm [place] after O2 noted to be 77% on room air. Pt O2 sat came up to 85% and O2 was increased to 4lpm." [LR]    Clinical Course User Index [LR] Mataeo Ingwersen, Cecille Aver, PA-C                             Medical Decision Making This patient is a 67 y.o. male  who presents to the ED for concern of bruising on the back and increased agitation.   Past Medical History / Co-morbidities: diabetes, Barrett's esophagus, gastroparesis, GERD, hyperlipidemia, hypertension, iron deficiency anemia, mental retardation, schizophrenia, seizure on Depakote and Lamictal  Additional history: Chart reviewed. Pertinent results include: Patient underwent endoscopy on 3/19 in setting of Barrett's esophagus with high-grade dysplasia,  lesion was ablated.  Physical Exam: Physical exam performed. The pertinent findings include: Initially had normal vital signs on arrival.  Patient had a transient episode of hypoxia, with oxygen saturations dropping down into the 70s.  Patient had no complaint of shortness of breath or chest pain.  Lung sounds clear, with normal respiratory effort.  Pulse oximetry location was changed to the ER, and he was placed on 4 L nasal cannula.  After some time he returned to 99%.  Lab Tests/Imaging studies: I personally interpreted labs/imaging and the pertinent results include: No leukocytosis, normal hemoglobin.  CMP unremarkable.  Valproic acid level slightly low at 43.  Normal TSH.  Lamotrigine and urinalysis levels pending.    Chest x-ray pending.  Cardiac monitoring: EKG obtained and interpreted by my attending physician which shows: Sinus rhythm with nonspecific T abnormalities   Disposition: Patient discussed and care transferred to [facility] at shift change. Please see his/her note for further details regarding further ED course and disposition. Plan at time of handoff is follow-up on labs and chest x-ray.  Recent concern with patient's episode of hypoxia.  Patient himself is not complaining of any shortness of breath, has normal respiratory effort and clear lung sounds.  This oxygen saturation level showed a good waveform on the monitor.  May require further evaluation into this beyond what is been ordered at this time, will default to the oncoming team..   Final Clinical Impression(s) / ED Diagnoses Final diagnoses:  None    Rx / DC Orders ED Discharge Orders     None      Portions of this report may have been transcribed using voice recognition software. Every effort was made to ensure accuracy; however, inadvertent computerized transcription errors may be present.    Estill Cotta 03/15/23 2211    Lacretia Leigh, MD 03/15/23 [phone]

## 2023-03-15 NOTE — ED Provider Notes (Signed)
  Physical Exam  BP (!) 146/100   Pulse 82   Temp 98.2 F (36.8 C) (Oral)   Resp 19   SpO2 94%   Physical Exam  Procedures  Procedures  ED Course / MDM   Clinical Course as of 03/15/23 2220  Fri Mar 15, 2023  2124 Per ED medic: "Pt placed on 2 lpm [place] after O2 noted to be 77% on room air. Pt O2 sat came up to 85% and O2 was increased to 4lpm." [LR]    Clinical Course User Index [LR] Roemhildt, Cecille Aver, PA-C   Medical Decision Making Amount and/or Complexity of Data Reviewed Labs: ordered. Radiology: ordered.  Risk Prescription drug management.   Patient ambulated without desaturation or tachypnea.  He has no oxygen requirement and states that he feels well.  His right lower back has a small fading bruise that is slightly green-tinged on the outside indicating that it is resolving.  He is distally neurovascularly intact bilateral lower extremities.  Ambulating without difficulty.  He denies any shortness of breath.  He is well-appearing and has had a reassuring workup here in the ER.  Will discharge home at this time.  I discussed with home caregiver who is at bedside.  They are agreeable to plan.       Pati Gallo [place], [place] 03/17/23 0136    Fatima Blank, MD 03/17/23 0700

## 2023-03-16 LAB — T4, FREE: Free T4: 0.75 ng/dL (ref 0.61–1.12)

## 2023-03-18 LAB — LAMOTRIGINE LEVEL: Lamotrigine Lvl: 8.4 ug/mL (ref 2.0–20.0)

## 2023-03-22 ENCOUNTER — Emergency Department (HOSPITAL_COMMUNITY)
Admission: EM | Admit: 2023-03-22 | Discharge: 2023-03-23 | Disposition: A | Discharge status: Home / Self Care | Payer: Medicare Other | Attending: Emergency Medicine | Admitting: Emergency Medicine

## 2023-03-22 ENCOUNTER — Other Ambulatory Visit: Payer: Self-pay

## 2023-03-22 DIAGNOSIS — I10 Essential (primary) hypertension: Secondary | ICD-10-CM | POA: Diagnosis not present

## 2023-03-22 DIAGNOSIS — E119 Type 2 diabetes mellitus without complications: Secondary | ICD-10-CM | POA: Insufficient documentation

## 2023-03-22 DIAGNOSIS — R531 Weakness: Secondary | ICD-10-CM | POA: Insufficient documentation

## 2023-03-22 DIAGNOSIS — Z79899 Other long term (current) drug therapy: Secondary | ICD-10-CM | POA: Diagnosis not present

## 2023-03-22 NOTE — ED Provider Notes (Signed)
[place] Provider Note   CSN: [id number] Arrival date & time: 03/22/23  2221     History  Chief Complaint  Patient presents with   Weakness    Patient to ED via EMS from unknown group home with complaint of stroke like Sx and weakness.Group home stated to EMS they thought patient had stroke SX. Sister of patient arrived to EMS and told them patient is at baseline and he has been like this since he had a stroke. Sister also says patient has low depakote levels.    [name] is a 67 y.o. male with history of diabetes, barrett's esophagus, gastroparesis, GERD, hyperlipidemia, hypertension, iron deficiency anemia, mental retardation, schizophrenia, seizure on Depakote and Lamictal who presents to the ER from group home for weakness. Sister Jackelyn Poling, healthcare POA) states she received a call from the group home that when trying to help the patient up to use his walker to get to the bathroom, he couldn't stand on his left leg.   Pt seen by myself last week after nontraumatic bruising on the lower back was noted. Sister reports a case has been filed with the state with concern for non-accidental trauma at the group home. She reports they received his lab work from his ER visit last week and his depakote level was too low. She was planning to follow with his neurologists on Monday about this. Sister reports patient is at his baseline mental status, and he was at baseline when she saw him at the group home being loaded into the ambulance.   Level 5 caveat due to cognitive delay   Weakness      Home Medications Prior to Admission medications   Medication Sig Start Date End Date Taking? Authorizing Provider  Cholecalciferol (VITAMIN D) 50 MCG (2000 UT) tablet Take 1 tablet (2,000 Units total) by mouth daily. 12/06/20   Angiulli, Lavon Paganini, PA-C  divalproex (DEPAKOTE) 500 MG DR tablet Take 1 tablet (500 mg total) by mouth every 12 (twelve)  hours. 12/07/20   Angiulli, Lavon Paganini, PA-C  docusate sodium (COLACE) 100 MG capsule Take 1 capsule (100 mg total) by mouth 2 (two) times daily. 12/06/20   Angiulli, Lavon Paganini, PA-C  Emollient (CORN HUSKERS EX) Apply 1 application topically in the morning and at bedtime. For dry skin    [provider]  lamoTRIgine (LAMICTAL) 100 MG tablet Take 1 tablet (100 mg total) by mouth daily. 12/07/20   Angiulli, Lavon Paganini, PA-C  levothyroxine (SYNTHROID) 75 MCG tablet Take 1 tablet (75 mcg total) by mouth daily at 6 (six) AM. 12/07/20   Angiulli, Lavon Paganini, PA-C  metoprolol tartrate (LOPRESSOR) 25 MG tablet Take 1 tablet (25 mg total) by mouth 2 (two) times daily. 07/10/22   Raulkar, Clide Deutscher, MD  ondansetron (ZOFRAN) 4 MG tablet Take 4 mg by mouth 4 (four) times daily as needed for nausea or vomiting.    [provider]  pantoprazole (PROTONIX) 40 MG tablet Take 1 tablet (40 mg total) by mouth 2 (two) times daily before a meal. 12/06/20   Angiulli, Lavon Paganini, PA-C  polyethylene glycol (MIRALAX / GLYCOLAX) 17 g packet Take 17 g by mouth 2 (two) times daily. 12/06/20   Angiulli, Lavon Paganini, PA-C  QUEtiapine (SEROQUEL) 400 MG tablet Take 1 tablet (400 mg total) by mouth 2 (two) times daily. 12/06/20   Angiulli, Lavon Paganini, PA-C  rosuvastatin (CRESTOR) 20 MG tablet Take 1 tablet (20 mg total) by  mouth daily. 12/07/20   Angiulli, Lavon Paganini, PA-C      Allergies    Vancomycin    Review of Systems   Review of Systems  Neurological:  Positive for weakness.  All other systems reviewed and are negative.   Physical Exam Updated Vital Signs BP 117/84   Pulse 82   Temp 98 F (36.7 C) (Oral)   Resp (!) 31   Ht 5\' 4"  (1.626 m)   Wt 65 kg   SpO2 96%   BMI 24.60 kg/m  Physical Exam Vitals and nursing note reviewed.  Constitutional:      Appearance: Normal appearance.  HENT:     Head: Normocephalic and atraumatic.  Eyes:     Conjunctiva/sclera: Conjunctivae normal.  Cardiovascular:     Rate  and Rhythm: Normal rate and regular rhythm.  Pulmonary:     Effort: Pulmonary effort is normal. No respiratory distress.     Breath sounds: Normal breath sounds.  Abdominal:     General: There is no distension.     Palpations: Abdomen is soft.     Tenderness: There is no abdominal tenderness.  Skin:    General: Skin is warm and dry.  Neurological:     General: No focal deficit present.     Mental Status: He is alert and oriented to person, place, and time. Mental status is at baseline.     Comments: Can range all extremities. 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 Sensation intact. PERRLA, EOMI. Speech is at baseline without dentures.      ED Results / Procedures / Treatments   Labs (all labs ordered are listed, but only abnormal results are displayed) Labs Reviewed - No data to display  EKG None  Radiology No results found.  Procedures Procedures    Medications Ordered in ED Medications - No data to display  ED Course/ Medical Decision Making/ A&P                             Medical Decision Making  This patient is a 66 y.o. male who presents to the ED for concern of weakness. From group home.    Differential diagnoses prior to evaluation: CVA, spinal cord injury, ACS, arrhythmia, syncope, orthostatic hypotension, sepsis, hypoglycemia, hypoxia, electrolyte disturbance, endocrine disorder, anemia, environmental exposure, polypharmacy  Past Medical History / Social History / Additional history: Chart reviewed. Pertinent results include: diabetes, barrett's esophagus, gastroparesis, GERD, hyperlipidemia, hypertension, iron deficiency anemia, mental retardation, schizophrenia, seizure on Depakote and Lamictal  Depakote level from 3/22 was 43. Lamotrigine level was normal.   Physical Exam: Physical exam performed. The pertinent findings include: Patient A&O x 3, moving all extremities without difficulty. Chronic  left arm weakness. PERRLA. Patient ambulating with walker. No acute focal deficits appreciated.   Disposition: After consideration of the diagnostic results and the patients response to treatment, I feel that emergency department workup does not suggest an emergent condition requiring admission or immediate intervention beyond what has been performed at this time. The plan is: discharge back to group home. Unlikely CVA as cause of patient's symptoms, as sister states he was at his baseline upon her arrival and has remained that way. He had difficulty cooperating with examination, but was able to ambulate without assistance like he does at baseline. Sister feels comfortable taking him back to group home and following up with neurology about his depakote levels.  The patient is safe for discharge and has been instructed to return immediately for worsening symptoms, change in symptoms or any other concerns.  Final Clinical Impression(s) / ED Diagnoses Final diagnoses:  Generalized weakness    Rx / DC Orders ED Discharge Orders     None      Portions of this report may have been transcribed using voice recognition software. Every effort was made to ensure accuracy; however, inadvertent computerized transcription errors may be present.    Estill Cotta 03/22/23 2357    Lacretia Leigh, MD 03/25/23 1616

## 2023-03-22 NOTE — Discharge Instructions (Signed)
[name] was seen in the ER with concern for weakness.  I am reassured that he is back at his baseline and he is feeling comfortable walking.  Please follow up with the neurologist regarding his valproic acid levels.

## 2023-04-01 ENCOUNTER — Ambulatory Visit (INDEPENDENT_AMBULATORY_CARE_PROVIDER_SITE_OTHER): Payer: Medicare Other | Admitting: Neurology

## 2023-04-01 ENCOUNTER — Encounter: Payer: Self-pay | Admitting: Neurology

## 2023-04-01 ENCOUNTER — Telehealth: Payer: Self-pay

## 2023-04-01 VITALS — BP 135/85 | HR 117 | Ht 63.0 in | Wt 141.5 lb

## 2023-04-01 DIAGNOSIS — G40909 Epilepsy, unspecified, not intractable, without status epilepticus: Secondary | ICD-10-CM

## 2023-04-01 DIAGNOSIS — Z9889 Other specified postprocedural states: Secondary | ICD-10-CM | POA: Diagnosis not present

## 2023-04-01 DIAGNOSIS — I69954 Hemiplegia and hemiparesis following unspecified cerebrovascular disease affecting left non-dominant side: Secondary | ICD-10-CM | POA: Diagnosis not present

## 2023-04-01 MED ORDER — LAMOTRIGINE 100 MG PO TABS: 100.0000 mg | ORAL_TABLET | Freq: Every day | ORAL | 11 refills | Status: DC

## 2023-04-01 MED ORDER — DIVALPROEX SODIUM 500 MG PO DR TAB: 500.0000 mg | DELAYED_RELEASE_TABLET | Freq: Two times a day (BID) | ORAL | 11 refills | Status: DC

## 2023-04-01 NOTE — Progress Notes (Signed)
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       ASSESSMENT AND PLAN  Enrique Manganaro Rogerson is a 67 y.o. 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  CT head without contrast from [facility] in April 2019: Encephalomalacia of right frontal lobe with small vessel disease in the superior right centrum semiovale, stable parietal craniotomy of the right and left superior frontal region,  Echocardiogram December 2021: Ejection fraction 65 to 70%  EEG October 21, 2020, mild to moderate diffuse encephalopathy, no epileptiform discharge   HISTORICAL data  CALEL PISARSKI a 67 year old male, seen in request by his primary care PA [name], [name], PA for evaluation of passing out spells, he is accompanied by his sister [name], who is also his power of attorney at today's visit on March 09, 2021.  I reviewed and summarized the referring note.  Past medical history Stroke with residual left side weakness, Seizure, Intellectual delay Dysphagia, Barrett's esophagus, GERD with  gastroparesis, Hypothyroidism, on supplement Hypertension Hyperlipidemia Schizophrenia Diabetes History of Craniotomy,   Patient had a history of craniotomy for benign brain tumor removal in 05-11-03 at [facility], postsurgically, he developed complications, required a second surgery, which is further complicated by stroke, with residual left-sided weakness.  The history is from his sister, I could not find the medical records through epic system  Patient also had lifelong history of schizophrenia, developmentally delayed, intellectual disability, lived with his father, who passed away in May 11, 2007, eventually he was placed at a group home since 2011/05/11,  He began to have seizure following his craniotomy in 05/11/03, was managed by outside neurologist, has been seizure-free for more than 10 years, stable on current medication of Depakote DR 500 mg twice a day, and lamotrigine 100 mg daily  In September 2021, he was found to be confused, altered mental status, hypotension, that was improved with hospital admission, hydration, hospital admission again in November 2021, for aspiration pneumonia, 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, periesophagus hernia, underwent robotic assistant laparoscopy, paraesophageal hernia repair, followed by prolonged rehabilitation,  His condition has gradually stabilized since, during the process, he was noted to have confusion, near syncope episode, fall at nursing home, there was no clinical seizure activity noted  Since the surgery, he has much improved, almost back to his baseline, he has unsteady gait, carry on simple conversation, has good appetite, residual left hemiparesis  UPDATE Oct 26 2021: He is accompanied by his group home staff Almira Bar at today's visit, who has known patient for 1 year, patient is overall doing well, has back to his baseline, have good appetite, sleeping well, continue have  language difficulty, gait abnormality, no  seizure-like activity  UPDate April 01 2023: He was accompanied by group home staff Denice Paradise at visit, who has known him for 2 months, described his difficulty using left side, which was actually present at previous examination, I tried to call his sister, power of attorney [name] without success,  Personally reviewed MRI of the brain September 2022, no acute abnormality, large cystic encephalomalacia at the right frontal lobe,  I reviewed emergency room visit on March 22, 2023,  he was taken to the emergency room for weakness, difficulty using his left side, sister is guardian, filed a complaining of non accidental trauma at a group home, Depakote level was low 43,,    Lab from March 15, 2023, Depakote level was 43, lamotrigine was 8.4, normal thyroid functional test, CBC, CMP,   PHYSICAL EXAM   Vitals:   04/01/23 1138  BP: 135/85  Pulse: (!) 117  Weight: 141 lb 8 oz (64.2 kg)  Height: 5\' 3"  (1.6 m)   Body mass index is 25.07 kg/m.  PHYSICAL EXAMNIATION:  Gen: NAD, conversant, well nourised, well groomed         NEUROLOGICAL EXAM:  MENTAL STATUS: Speech/cognition: Awake, alert, cooperative on examination, slurred speech, know his birthday, but not his age,   CRANIAL NERVES: CN II: Visual fields are full to confrontation. Pupils are round equal and briskly reactive to light. CN III, IV, VI: extraocular movement are normal. No ptosis. CN V: Facial sensation is intact to light touch CN VII: Face is symmetric with normal eye closure  CN VIII: Hearing is normal to causal conversation. CN IX, X: Phonation is normal. CN XI: Head turning and shoulder shrug are intact  MOTOR: Spastic left hemiparesis Antigravity movement of left upper extremity, mild drift of left leg,    REFLEXES: Hyperreflexia on the left side  SENSORY: Intact to light touch, pinprick and vibratory sensation are intact in fingers and toes.  COORDINATION: There is no trunk or limb dysmetria  noted.  GAIT/STANCE: Need assistant to get up from seated position, dragging left leg, rely on his walker  REVIEW OF SYSTEMS: Full 14 system review of systems performed and notable only for as above All other review of systems were negative.  ALLERGIES: Allergies  Allergen Reactions   Vancomycin Other (See Comments) and Itching    Unknown-possible hives  Unknown-possible hives, Hives.    HOME MEDICATIONS: Current Outpatient Medications  Medication Sig Dispense Refill   Cholecalciferol (VITAMIN D) 50 MCG (2000 UT) tablet Take 1 tablet (2,000 Units total) by mouth daily. 30 tablet 0   divalproex (DEPAKOTE) 500 MG DR tablet Take 1 tablet (500 mg total) by mouth every 12 (twelve) hours. 60 tablet 0   docusate sodium (COLACE) 100 MG capsule Take 1 capsule (100 mg total) by mouth 2 (two) times daily. 10 capsule 0   Emollient (CORN HUSKERS EX) Apply 1 application topically in the morning and at bedtime. For dry skin     lamoTRIgine (LAMICTAL) 100 MG tablet Take 1 tablet (100 mg total) by mouth daily. 30 tablet 0   levothyroxine (SYNTHROID) 75 MCG tablet Take 1 tablet (75 mcg total) by mouth daily at 6 (six) AM. 30 tablet 0   metoprolol tartrate (LOPRESSOR) 25 MG tablet Take 1 tablet (25 mg total) by mouth 2 (two) times daily. 180 tablet 3   ondansetron (ZOFRAN) 4 MG tablet Take 4 mg by mouth 4 (four) times daily as needed for nausea or vomiting.  pantoprazole (PROTONIX) 40 MG tablet Take 1 tablet (40 mg total) by mouth 2 (two) times daily before a meal. 60 tablet 0   polyethylene glycol (MIRALAX / GLYCOLAX) 17 g packet Take 17 g by mouth 2 (two) times daily. 14 each 0   QUEtiapine (SEROQUEL) 400 MG tablet Take 1 tablet (400 mg total) by mouth 2 (two) times daily. 60 tablet 0   rosuvastatin (CRESTOR) 20 MG tablet Take 1 tablet (20 mg total) by mouth daily. 30 tablet 0   No current facility-administered medications for this visit.    PAST MEDICAL HISTORY: Past Medical History:   Diagnosis Date   Barrett esophagus    DM II (diabetes mellitus, type II), controlled 11/13/2020   Dysphagia    Gastroparesis    GERD (gastroesophageal reflux disease)    Hypercholesteremia    Hypertension    Iron deficiency anemia    Mental retardation    Schizophrenia    Seizure    Stroke     PAST SURGICAL HISTORY: Past Surgical History:  Procedure Laterality Date   BIOPSY  10/21/2020   Procedure: BIOPSY;  Surgeon: Iva BoopGessner, Carl E, MD;  [facility];  Service: Endoscopy;;   CRANIOTOMY FOR CYST FENESTRATION     ESOPHAGOGASTRODUODENOSCOPY N/A 01/09/2015   Procedure: ESOPHAGOGASTRODUODENOSCOPY (EGD);  Surgeon: Louis Meckelobert D Kaplan, MD;  [facility];  Service: Endoscopy;  Laterality: N/A;   ESOPHAGOGASTRODUODENOSCOPY N/A 10/26/2020   Procedure: ESOPHAGOGASTRODUODENOSCOPY (EGD);  Surgeon: Corliss SkainsLightfoot, Harrell O, MD;  [facility];  Service: Thoracic;  Laterality: N/A;   ESOPHAGOGASTRODUODENOSCOPY (EGD) WITH PROPOFOL N/A 10/21/2020   Procedure: ESOPHAGOGASTRODUODENOSCOPY (EGD) WITH PROPOFOL;  Surgeon: Iva BoopGessner, Carl E, MD;  [facility];  Service: Endoscopy;  Laterality: N/A;   IR GASTR TUBE CONVERT GASTR-JEJ PER W/FL MOD SED  11/07/2020   PEG PLACEMENT N/A 10/26/2020   Procedure: PERCUTANEOUS ENDOSCOPIC GASTROSTOMY (PEG) PLACEMENT;  Surgeon: Corliss SkainsLightfoot, Harrell O, MD;  Location: [facility] OR;  Service: Thoracic;  Laterality: N/A;   XI ROBOTIC ASSISTED HIATAL HERNIA REPAIR N/A 10/26/2020   Procedure: XI ROBOTIC ASSISTED HIATAL HERNIA REPAIR;  Surgeon: Corliss SkainsLightfoot, Harrell O, MD;  Location: [facility] OR;  Service: Thoracic;  Laterality: N/A;    FAMILY HISTORY: Family History  Problem Relation Age of Onset   Breast cancer Mother    Heart disease Father     SOCIAL HISTORY: Social History   Socioeconomic History   Marital status: Single    Spouse name: Not on file   Number of children: 0   Years of education: 12   Highest education level: High school graduate   Occupational History   Occupation: Disabled  Tobacco Use   Smoking status: Never   Smokeless tobacco: Never  Vaping Use   Vaping Use: Never used  Substance and Sexual Activity   Alcohol use: No   Drug use: Never   Sexual activity: Never  Other Topics Concern   Not on file  Social History Narrative   Lives at [facility] group home (ph: [phone]).   Left-handed.   No daily caffeine use.   Social Determinants of Health   Financial Resource Strain: Not on file  Food Insecurity: Not on file  Transportation Needs: Not on file  Physical Activity: Not on file  Stress: Not on file  Social Connections: Not on file  Intimate Partner Violence: Not on file      Levert FeinsteinYijun Craig Wisnewski, M.D. Ph.D.  [facility] [address], [address] [place], [place] [address] Ph: [phone] Fax: [phone]  CC:  Lucretia Field [address] [place],  [place] [address]  Katina Dung M

## 2023-04-01 NOTE — Telephone Encounter (Signed)
Pt sister poa called and stated that she received 3 calls from our facility but no msg was left nor was there documentation in the chart. She stated that the person who brought him to the appt today was not knowledgeable of the patients condition.  Pt sister was activated as a user on his mychart so she could see after visit summary. Pt sister stated that his depakote level was 45 which was considered low by group home. I stated that we didn't have those results and Dr. Terrace Arabia would need those to change dosage or give recommendations. Pt sister voiced gratitude and understanding.she will do her best to upload a photo of the results

## 2023-04-08 ENCOUNTER — Telehealth: Payer: Self-pay | Admitting: Neurology

## 2023-04-08 NOTE — Telephone Encounter (Signed)
Pt sister called back. Requesting a call back from nurse.

## 2023-04-08 NOTE — Telephone Encounter (Signed)
She is requesting a call because she wants Dr. Terrace Arabia to know what's going on with pt.

## 2023-04-08 NOTE — Telephone Encounter (Signed)
Pt sister called. Stated that pt divalproex (DEPAKOTE) 500 MG DR tablet was increased to 750 mg by the nursing home doctor. She is requesting a call from nurse to discuss why they increased medication.

## 2023-04-08 NOTE — Telephone Encounter (Signed)
Called and LVM per DPR and stated to call back if she still needed to share information with [place].

## 2023-04-09 ENCOUNTER — Other Ambulatory Visit: Payer: Self-pay

## 2023-04-09 ENCOUNTER — Encounter (HOSPITAL_COMMUNITY): Payer: Self-pay

## 2023-04-09 ENCOUNTER — Emergency Department (HOSPITAL_COMMUNITY): Payer: Medicare Other

## 2023-04-09 ENCOUNTER — Observation Stay (HOSPITAL_COMMUNITY): Payer: Medicare Other

## 2023-04-09 ENCOUNTER — Inpatient Hospital Stay (HOSPITAL_COMMUNITY)
Admission: EM | Admit: 2023-04-09 | Discharge: 2023-04-15 | DRG: 101 | Disposition: A | Discharge status: Home / Self Care | Payer: Medicare Other | Attending: Internal Medicine | Admitting: Internal Medicine

## 2023-04-09 DIAGNOSIS — I69354 Hemiplegia and hemiparesis following cerebral infarction affecting left non-dominant side: Secondary | ICD-10-CM | POA: Diagnosis not present

## 2023-04-09 DIAGNOSIS — K3184 Gastroparesis: Secondary | ICD-10-CM | POA: Diagnosis not present

## 2023-04-09 DIAGNOSIS — R6250 Unspecified lack of expected normal physiological development in childhood: Secondary | ICD-10-CM | POA: Diagnosis not present

## 2023-04-09 DIAGNOSIS — E860 Dehydration: Secondary | ICD-10-CM | POA: Diagnosis not present

## 2023-04-09 DIAGNOSIS — Z931 Gastrostomy status: Secondary | ICD-10-CM

## 2023-04-09 DIAGNOSIS — E1165 Type 2 diabetes mellitus with hyperglycemia: Secondary | ICD-10-CM | POA: Diagnosis present

## 2023-04-09 DIAGNOSIS — Z79899 Other long term (current) drug therapy: Secondary | ICD-10-CM

## 2023-04-09 DIAGNOSIS — Z8719 Personal history of other diseases of the digestive system: Secondary | ICD-10-CM

## 2023-04-09 DIAGNOSIS — G9341 Metabolic encephalopathy: Secondary | ICD-10-CM | POA: Diagnosis present

## 2023-04-09 DIAGNOSIS — Z9889 Other specified postprocedural states: Secondary | ICD-10-CM

## 2023-04-09 DIAGNOSIS — F05 Delirium due to known physiological condition: Secondary | ICD-10-CM | POA: Diagnosis not present

## 2023-04-09 DIAGNOSIS — G8384 Todd's paralysis (postepileptic): Secondary | ICD-10-CM | POA: Diagnosis not present

## 2023-04-09 DIAGNOSIS — F79 Unspecified intellectual disabilities: Secondary | ICD-10-CM | POA: Diagnosis not present

## 2023-04-09 DIAGNOSIS — K219 Gastro-esophageal reflux disease without esophagitis: Secondary | ICD-10-CM | POA: Diagnosis not present

## 2023-04-09 DIAGNOSIS — G9389 Other specified disorders of brain: Secondary | ICD-10-CM | POA: Diagnosis not present

## 2023-04-09 DIAGNOSIS — Z8249 Family history of ischemic heart disease and other diseases of the circulatory system: Secondary | ICD-10-CM

## 2023-04-09 DIAGNOSIS — G8190 Hemiplegia, unspecified affecting unspecified side: Secondary | ICD-10-CM

## 2023-04-09 DIAGNOSIS — K2270 Barrett's esophagus without dysplasia: Secondary | ICD-10-CM | POA: Diagnosis present

## 2023-04-09 DIAGNOSIS — I69398 Other sequelae of cerebral infarction: Secondary | ICD-10-CM

## 2023-04-09 DIAGNOSIS — G40209 Localization-related (focal) (partial) symptomatic epilepsy and epileptic syndromes with complex partial seizures, not intractable, without status epilepticus: Secondary | ICD-10-CM | POA: Diagnosis not present

## 2023-04-09 DIAGNOSIS — R531 Weakness: Principal | ICD-10-CM

## 2023-04-09 DIAGNOSIS — Z79890 Hormone replacement therapy (postmenopausal): Secondary | ICD-10-CM

## 2023-04-09 DIAGNOSIS — E1143 Type 2 diabetes mellitus with diabetic autonomic (poly)neuropathy: Secondary | ICD-10-CM | POA: Diagnosis not present

## 2023-04-09 DIAGNOSIS — I10 Essential (primary) hypertension: Secondary | ICD-10-CM | POA: Diagnosis not present

## 2023-04-09 DIAGNOSIS — F209 Schizophrenia, unspecified: Secondary | ICD-10-CM | POA: Diagnosis not present

## 2023-04-09 DIAGNOSIS — Z888 Allergy status to other drugs, medicaments and biological substances status: Secondary | ICD-10-CM

## 2023-04-09 DIAGNOSIS — E039 Hypothyroidism, unspecified: Secondary | ICD-10-CM | POA: Diagnosis not present

## 2023-04-09 DIAGNOSIS — R4182 Altered mental status, unspecified: Secondary | ICD-10-CM

## 2023-04-09 DIAGNOSIS — E119 Type 2 diabetes mellitus without complications: Secondary | ICD-10-CM

## 2023-04-09 DIAGNOSIS — E7800 Pure hypercholesterolemia, unspecified: Secondary | ICD-10-CM | POA: Diagnosis not present

## 2023-04-09 DIAGNOSIS — G9340 Encephalopathy, unspecified: Secondary | ICD-10-CM | POA: Diagnosis present

## 2023-04-09 DIAGNOSIS — G40919 Epilepsy, unspecified, intractable, without status epilepticus: Secondary | ICD-10-CM

## 2023-04-09 LAB — PROTIME-INR
INR: 1.1 (ref 0.8–1.2)
Prothrombin Time: 14.4 seconds (ref 11.4–15.2)

## 2023-04-09 LAB — CBC
HCT: 41.5 % (ref 39.0–52.0)
Hemoglobin: 14.4 g/dL (ref 13.0–17.0)
MCH: 32.1 pg (ref 26.0–34.0)
MCHC: 34.7 g/dL (ref 30.0–36.0)
MCV: 92.6 fL (ref 80.0–100.0)
Platelets: 184 10*3/uL (ref 150–400)
RBC: 4.48 MIL/uL (ref 4.22–5.81)
RDW: 11.6 % (ref 11.5–15.5)
WBC: 4.7 10*3/uL (ref 4.0–10.5)
nRBC: 0 % (ref 0.0–0.2)

## 2023-04-09 LAB — COMPREHENSIVE METABOLIC PANEL
ALT: 13 U/L (ref 0–44)
AST: 23 U/L (ref 15–41)
Albumin: 3.3 g/dL — ABNORMAL LOW (ref 3.5–5.0)
Alkaline Phosphatase: 48 U/L (ref 38–126)
Anion gap: 8 (ref 5–15)
BUN: 24 mg/dL — ABNORMAL HIGH (ref 8–23)
CO2: 25 mmol/L (ref 22–32)
Calcium: 8.1 mg/dL — ABNORMAL LOW (ref 8.9–10.3)
Chloride: 101 mmol/L (ref 98–111)
Creatinine, Ser: 1.17 mg/dL (ref 0.61–1.24)
GFR, Estimated: >60 mL/min (ref >60)
Glucose, Bld: 187 mg/dL — ABNORMAL HIGH (ref 70–99)
Potassium: 3.5 mmol/L (ref 3.5–5.1)
Sodium: 134 mmol/L — ABNORMAL LOW (ref 135–145)
Total Bilirubin: 0.5 mg/dL (ref 0.3–1.2)
Total Protein: 6.2 g/dL — ABNORMAL LOW (ref 6.5–8.1)

## 2023-04-09 LAB — CBC WITH DIFFERENTIAL/PLATELET
Abs Immature Granulocytes: 0 10*3/uL (ref 0.00–0.07)
Basophils Absolute: 0 10*3/uL (ref 0.0–0.1)
Basophils Relative: 1 %
Eosinophils Absolute: 0.1 10*3/uL (ref 0.0–0.5)
Eosinophils Relative: 2 %
HCT: 40.7 % (ref 39.0–52.0)
Hemoglobin: 13.3 g/dL (ref 13.0–17.0)
Immature Granulocytes: 0 %
Lymphocytes Relative: 48 %
Lymphs Abs: 1.3 10*3/uL (ref 0.7–4.0)
MCH: 31.1 pg (ref 26.0–34.0)
MCHC: 32.7 g/dL (ref 30.0–36.0)
MCV: 95.3 fL (ref 80.0–100.0)
Monocytes Absolute: 0.2 10*3/uL (ref 0.1–1.0)
Monocytes Relative: 8 %
Neutro Abs: 1.1 10*3/uL — ABNORMAL LOW (ref 1.7–7.7)
Neutrophils Relative %: 41 %
Platelets: 153 10*3/uL (ref 150–400)
RBC: 4.27 MIL/uL (ref 4.22–5.81)
RDW: 11.6 % (ref 11.5–15.5)
WBC: 2.7 10*3/uL — ABNORMAL LOW (ref 4.0–10.5)
nRBC: 0 % (ref 0.0–0.2)

## 2023-04-09 LAB — URINALYSIS, ROUTINE W REFLEX MICROSCOPIC
Bilirubin Urine: NEGATIVE
Glucose, UA: NEGATIVE mg/dL
Hgb urine dipstick: NEGATIVE
Ketones, ur: NEGATIVE mg/dL
Leukocytes,Ua: NEGATIVE
Nitrite: NEGATIVE
Protein, ur: NEGATIVE mg/dL
Specific Gravity, Urine: 1.021 (ref 1.005–1.030)
pH: 5 (ref 5.0–8.0)

## 2023-04-09 LAB — VALPROIC ACID LEVEL: Valproic Acid Lvl: 80 ug/mL (ref 50.0–100.0)

## 2023-04-09 LAB — GLUCOSE, CAPILLARY: Glucose-Capillary: 87 mg/dL (ref 70–99)

## 2023-04-09 LAB — APTT: aPTT: 30 seconds (ref 24–36)

## 2023-04-09 LAB — LACTIC ACID, PLASMA
Lactic Acid, Venous: 2.2 mmol/L (ref 0.5–1.9)
Lactic Acid, Venous: 1.4 mmol/L (ref 0.5–1.9)

## 2023-04-09 LAB — CBG MONITORING, ED: Glucose-Capillary: 176 mg/dL — ABNORMAL HIGH (ref 70–99)

## 2023-04-09 LAB — RAPID URINE DRUG SCREEN, HOSP PERFORMED
Amphetamines: NOT DETECTED
Barbiturates: NOT DETECTED
Benzodiazepines: NOT DETECTED
Cocaine: NOT DETECTED
Opiates: NOT DETECTED
Tetrahydrocannabinol: NOT DETECTED

## 2023-04-09 LAB — HEMOGLOBIN A1C
Hgb A1c MFr Bld: 5.6 % (ref 4.8–5.6)
Mean Plasma Glucose: 114.02 mg/dL

## 2023-04-09 LAB — MAGNESIUM: Magnesium: 2.3 mg/dL (ref 1.7–2.4)

## 2023-04-09 LAB — CREATININE, SERUM
Creatinine, Ser: 1.17 mg/dL (ref 0.61–1.24)
GFR, Estimated: >60 mL/min (ref >60)

## 2023-04-09 LAB — HIV ANTIBODY (ROUTINE TESTING W REFLEX): HIV Screen 4th Generation wRfx: NONREACTIVE

## 2023-04-09 LAB — ETHANOL: Alcohol, Ethyl (B): <10 mg/dL (ref <10)

## 2023-04-09 MED ORDER — VITAMIN D 25 MCG (1000 UNIT) PO TABS
2000.0000 [IU] | ORAL_TABLET | Freq: Every day | ORAL | Status: DC
  Administered 2023-04-09 – 2023-04-15 (×7): 2000 [IU] via ORAL
  Filled 2023-04-09 (×6): qty 2

## 2023-04-09 MED ORDER — SENNOSIDES-DOCUSATE SODIUM 8.6-50 MG PO TABS: 2.0000 | ORAL_TABLET | Freq: Every evening | ORAL | Status: DC | PRN

## 2023-04-09 MED ORDER — ENOXAPARIN SODIUM 40 MG/0.4ML IJ SOSY
40.0000 mg | PREFILLED_SYRINGE | INTRAMUSCULAR | Status: DC
  Administered 2023-04-09 – 2023-04-14 (×6): 40 mg via SUBCUTANEOUS
  Filled 2023-04-09 (×5): qty 0.4

## 2023-04-09 MED ORDER — QUETIAPINE FUMARATE 300 MG PO TABS
400.0000 mg | ORAL_TABLET | Freq: Two times a day (BID) | ORAL | Status: DC
  Filled 2023-04-09 (×2): qty 1

## 2023-04-09 MED ORDER — STROKE: EARLY STAGES OF RECOVERY BOOK
Freq: Once | Status: AC
  Filled 2023-04-09: qty 1

## 2023-04-09 MED ORDER — DEXTROSE-NACL 5-0.45 % IV SOLN: INTRAVENOUS | Status: DC

## 2023-04-09 MED ORDER — ACETAMINOPHEN 325 MG PO TABS
650.0000 mg | ORAL_TABLET | ORAL | Status: DC | PRN
  Administered 2023-04-14 (×2): 650 mg via ORAL
  Filled 2023-04-09 (×2): qty 2

## 2023-04-09 MED ORDER — SODIUM CHLORIDE 0.9 % IV BOLUS
500.0000 mL | Freq: Once | INTRAVENOUS | Status: AC
  Administered 2023-04-09: 500 mL via INTRAVENOUS

## 2023-04-09 MED ORDER — GUAIFENESIN 100 MG/5ML PO LIQD: 5.0000 mL | ORAL | Status: DC | PRN

## 2023-04-09 MED ORDER — ONDANSETRON HCL 4 MG/2ML IJ SOLN: 4.0000 mg | Freq: Four times a day (QID) | INTRAMUSCULAR | Status: DC | PRN

## 2023-04-09 MED ORDER — LABETALOL HCL 5 MG/ML IV SOLN: 10.0000 mg | INTRAVENOUS | Status: DC | PRN

## 2023-04-09 MED ORDER — SODIUM CHLORIDE 0.9 % IV SOLN: INTRAVENOUS | Status: DC

## 2023-04-09 MED ORDER — TRAZODONE HCL 50 MG PO TABS
50.0000 mg | ORAL_TABLET | Freq: Every evening | ORAL | Status: DC | PRN
  Administered 2023-04-14: 50 mg via ORAL
  Filled 2023-04-09: qty 1

## 2023-04-09 MED ORDER — METOPROLOL TARTRATE 25 MG PO TABS: 25.0000 mg | ORAL_TABLET | Freq: Two times a day (BID) | ORAL | Status: DC

## 2023-04-09 MED ORDER — ACETAMINOPHEN 160 MG/5ML PO SOLN: 650.0000 mg | ORAL | Status: DC | PRN

## 2023-04-09 MED ORDER — METOPROLOL TARTRATE 25 MG PO TABS
25.0000 mg | ORAL_TABLET | Freq: Every day | ORAL | Status: DC
  Administered 2023-04-10: 25 mg via ORAL
  Filled 2023-04-09: qty 1

## 2023-04-09 MED ORDER — QUETIAPINE FUMARATE 300 MG PO TABS
400.0000 mg | ORAL_TABLET | Freq: Two times a day (BID) | ORAL | Status: DC
  Administered 2023-04-09 – 2023-04-15 (×12): 400 mg via ORAL
  Filled 2023-04-09 (×13): qty 1

## 2023-04-09 MED ORDER — LEVOTHYROXINE SODIUM 50 MCG PO TABS: 75.0000 ug | ORAL_TABLET | Freq: Every day | ORAL | Status: DC

## 2023-04-09 MED ORDER — PANTOPRAZOLE SODIUM 40 MG PO TBEC
40.0000 mg | DELAYED_RELEASE_TABLET | Freq: Two times a day (BID) | ORAL | Status: DC
  Administered 2023-04-09 – 2023-04-15 (×12): 40 mg via ORAL
  Filled 2023-04-09 (×5): qty 1
  Filled 2023-04-09: qty 2
  Filled 2023-04-09 (×5): qty 1

## 2023-04-09 MED ORDER — DIVALPROEX SODIUM 500 MG PO DR TAB
750.0000 mg | DELAYED_RELEASE_TABLET | Freq: Two times a day (BID) | ORAL | Status: DC
  Administered 2023-04-09 – 2023-04-15 (×12): 750 mg via ORAL
  Filled 2023-04-09 (×13): qty 1

## 2023-04-09 MED ORDER — LAMOTRIGINE 100 MG PO TABS
100.0000 mg | ORAL_TABLET | Freq: Every day | ORAL | Status: DC
  Administered 2023-04-09 – 2023-04-14 (×6): 100 mg via ORAL
  Filled 2023-04-09 (×6): qty 1

## 2023-04-09 MED ORDER — DIVALPROEX SODIUM 500 MG PO DR TAB: 500.0000 mg | DELAYED_RELEASE_TABLET | Freq: Two times a day (BID) | ORAL | Status: DC

## 2023-04-09 MED ORDER — ACETAMINOPHEN 650 MG RE SUPP: 650.0000 mg | RECTAL | Status: DC | PRN

## 2023-04-09 MED ORDER — DOCUSATE SODIUM 100 MG PO CAPS
100.0000 mg | ORAL_CAPSULE | Freq: Two times a day (BID) | ORAL | Status: DC
  Administered 2023-04-09 – 2023-04-15 (×11): 100 mg via ORAL
  Filled 2023-04-09 (×11): qty 1

## 2023-04-09 MED ORDER — ROSUVASTATIN CALCIUM 20 MG PO TABS
20.0000 mg | ORAL_TABLET | Freq: Every day | ORAL | Status: DC
  Administered 2023-04-09 – 2023-04-15 (×7): 20 mg via ORAL
  Filled 2023-04-09 (×7): qty 1

## 2023-04-09 MED ORDER — IOHEXOL 350 MG/ML SOLN
75.0000 mL | Freq: Once | INTRAVENOUS | Status: AC | PRN
  Administered 2023-04-09: 75 mL via INTRAVENOUS

## 2023-04-09 MED ORDER — POLYETHYLENE GLYCOL 3350 17 G PO PACK
17.0000 g | PACK | Freq: Two times a day (BID) | ORAL | Status: DC
  Administered 2023-04-09 – 2023-04-14 (×9): 17 g via ORAL
  Filled 2023-04-09 (×9): qty 1

## 2023-04-09 MED ORDER — IPRATROPIUM-ALBUTEROL 0.5-2.5 (3) MG/3ML IN SOLN: 3.0000 mL | RESPIRATORY_TRACT | Status: DC | PRN

## 2023-04-09 NOTE — Consult Note (Signed)
[facility] Telemedicine Consult   Requesting Provider: Naasz, H Consult ParticipantsHerbert Punient, bedside nurse Location of the provider: [facility] Location of the patient: [facility]  This consult was provided via telemedicine with 2-way video and audio communication. The patient/family was informed that care would be provided in this way and agreed to receive care in this manner.    Chief Complaint: Left sided weakness  HPI: 67 year old male with a history of previous fairly large right frontal stroke as well as poststroke seizure disorder who presents after an episode of decreased responsiveness followed by worsening of his underlying symptoms this morning.  He went to breakfast in his normal state and was able to get to the breakfast table with his rollator.  At the conclusion of breakfast, it was noted that he was not standing up.  They stated that he was "like he was staring straight through."  He had no twitching or other abnormal movements.  Initially, he would still answer verbal questions, but once EMS arrived, he would not respond to them at all.  Subsequently, he improved but his baseline symptoms appeared worse than previously documented and therefore code stroke was activated.  He recently had his Depakote increased from 500 mg twice daily to 750 mg twice daily based on a low VPA level.   LKW: 8 AM tpa given?: No, out of window IR Thrombectomy? No, no LVO MR S: 3-4  Exam: Vitals:   04/09/23 0959  BP: 117/62  Pulse: (!) 110  Resp: 19  Temp: 98.2 F (36.8 C)  SpO2: 94%    General: in bed, NAD  1A: Level of Consciousness - 0 1B: Ask Month and Age - 2 1C: 'Blink Eyes' & 'Squeeze Hands' - 0 2: Test Horizontal Extraocular Movements - 2 3: Test Visual Fields - 0(gives inconsistent responses, but blinks to threat bilaterally) 4: Test Facial Palsy - 1 5A: Test Left Arm Motor Drift - 2 5B: Test Right Arm Motor Drift - 0 6A: Test Left Leg  Motor Drift - 2 6B: Test Right Leg Motor Drift - 0 7: Test Limb Ataxia - 0 8: Test Sensation - 1 9: Test Language/Aphasia- 0 10: Test Dysarthria - 1 11: Test Extinction/Inattention - 1(extinguishes to DSS on the legs) NIHSS score: 12   Imaging Reviewed: CT head - negative  Labs reviewed in epic and pertinent values follow: Cr 1.17   Assessment: 67 year old male with a history of previous stroke as well as recurrent seizures who had an episode of decreased responsiveness with worsening of his left-sided deficits.  My suspicion is that this does represent seizure with postictal state, but given the fact that this is happening recurrently and there is no definite seizure activity seen, it may be worthwhile to consider doing a prolonged EEG to see if we can capture these episodes.    Recommendations:  1) continue Depakote 750 twice daily 2) continue Lamictal 100 mg nightly(low-dose, but Depakote inhibits metabolization) 3) levels of both Depakote and Lamictal 4) MRI brain 5) consider LTM EEG given multiple spells without clear seizure activity  Ritta Slot, MD [facility] [phone]  If 7pm- 7am, please page neurology on call as listed in AMION.

## 2023-04-09 NOTE — ED Notes (Signed)
[facility] called. 

## 2023-04-09 NOTE — ED Triage Notes (Addendum)
Patient BIB [facility] from Group Home. Patient was found sitting slumped over in his chair, group home said they think he had a seizure. Was incontinent on arrival. Has intellectual disability.   EMS CBG 206 IV 20G left hand fluid  [phone] is the group home phone number [address]

## 2023-04-09 NOTE — Progress Notes (Addendum)
Patient's sister and POA, Mrs. [name], called and updates were provided. She was informed that this patient is on continued EEG monitoring and have a pending brain MRI. MRI needs to gather some information prior test, information that the patient cannot provide due to his confusion. Mrs. Benton agrees to provide the information needed to MRI. MRI tech was informed of this and Mrs. [name] contact information was given to them. They will follow up tomorrow for MRI since this patient is on EEG now and they need some information prior test. Provider was notified, Dr. Loney Loh.

## 2023-04-09 NOTE — Telephone Encounter (Signed)
Please let his sister know, during hospital admission, there will be hospitalist,/neurohospitalist, different team of physician work with him, adjusting his medication accordingly,  Will follow-up with him after discharge, adjusting medication if needed then

## 2023-04-09 NOTE — ED Provider Notes (Signed)
[facility] AT [facility] Provider Note   CSN: [id number] Arrival date & time: 04/09/23  0946     History  Chief Complaint  Patient presents with   Seizures    [name] is a 67 y.o. male with HTN, hemiplegia, intellectual disability, schizophrenia, GERD, gastroparesis, hiatal hernia, h/o GIB, hypothyroidism, s/p PEG tube placement, lived in group home since 2012 who presents with possible seizure. Patient BIB [facility] from Group Home. Patient was found sitting slumped over in his chair, group home said they think he had a seizure. Was incontinent on arrival. Has intellectual disability. Presents with a group home caretaker at bedside.    [phone] is the group home phone number [address] [place]  Discussed on the phone with caregivers from patient's group home as well as the one present in the room.   Yesterday patient couldn't get up on his walker, had weakness on L side. Was completely limp on the left side, was speaking gibberish which he "doesn't do very often." Yesterday he had to be carried to bathroom/room. This morning he had the same symptoms again. He was last his normal self at breakfast this AM, at 0800 AM.   His blood pressure at that time was high as well. Couldn't answer any questions the staff at the group home had or the EMS had for him. He has weakness on the left side at baseline but not to the degree where he can't stand up or walk. The symptoms occur and then resolve as well. He has Depakote for seizures at home, on 03/15/23 had subtherapeutic depakote level so group home MD increased his dose. Dose of depakote is 750 mg twice per day. Saw neurologist on 04/01/23 and there wasn't communication to let the neurologist know his dose was increased. Doesn't take any blood thinners.  Glucose with EMS was 206 mg/dL   Seizures      Home Medications Prior to Admission medications   Medication Sig Start Date End  Date Taking? Authorizing Provider  benazepril (LOTENSIN) 5 MG tablet Take 5 mg by mouth daily. 04/05/23   [provider]  Cholecalciferol (VITAMIN D) 50 MCG (2000 UT) tablet Take 1 tablet (2,000 Units total) by mouth daily. 12/06/20   Angiulli, Mcarthur Rossetti, PA-C  divalproex (DEPAKOTE) 250 MG DR tablet Take 250 mg by mouth 2 (two) times daily.    [provider]  divalproex (DEPAKOTE) 500 MG DR tablet Take 1 tablet (500 mg total) by mouth every 12 (twelve) hours. 04/01/23   Levert Feinstein, MD  docusate sodium (COLACE) 100 MG capsule Take 1 capsule (100 mg total) by mouth 2 (two) times daily. 12/06/20   Angiulli, Mcarthur Rossetti, PA-C  Emollient (CORN HUSKERS EX) Apply 1 application topically in the morning and at bedtime. For dry skin    [provider]  lamoTRIgine (LAMICTAL) 100 MG tablet Take 1 tablet (100 mg total) by mouth at bedtime. 04/01/23   Levert Feinstein, MD  levothyroxine (SYNTHROID) 175 MCG tablet Take 175 mcg by mouth daily. 04/05/23   [provider]  levothyroxine (SYNTHROID) 75 MCG tablet Take 1 tablet (75 mcg total) by mouth daily at 6 (six) AM. 12/07/20   Angiulli, Mcarthur Rossetti, PA-C  metoprolol tartrate (LOPRESSOR) 25 MG tablet Take 1 tablet (25 mg total) by mouth 2 (two) times daily. 07/10/22   Raulkar, Drema Pry, MD  ondansetron (ZOFRAN) 4 MG tablet Take 4 mg by mouth 4 (four) times daily as needed for  nausea or vomiting.    [provider]  pantoprazole (PROTONIX) 40 MG tablet Take 1 tablet (40 mg total) by mouth 2 (two) times daily before a meal. 12/06/20   Angiulli, Mcarthur Rossetti, PA-C  polyethylene glycol (MIRALAX / GLYCOLAX) 17 g packet Take 17 g by mouth 2 (two) times daily. 12/06/20   Angiulli, Mcarthur Rossetti, PA-C  QUEtiapine (SEROQUEL) 400 MG tablet Take 1 tablet (400 mg total) by mouth 2 (two) times daily. 12/06/20   Angiulli, Mcarthur Rossetti, PA-C  rosuvastatin (CRESTOR) 20 MG tablet Take 1 tablet (20 mg total) by mouth daily. 12/07/20   Angiulli, Mcarthur Rossetti, PA-C       Allergies    Vancomycin    Review of Systems   Review of Systems  Unable to perform ROS: Other (intellectual disability)  Neurological:  Positive for seizures.    Physical Exam Updated Vital Signs BP 109/69 (BP Location: Right Arm)   Pulse (!) 58   Temp 97.9 F (36.6 C)   Resp 16   Ht  (1.6 m)   Wt 65 kg   SpO2 98%   BMI 25.38 kg/m  Physical Exam General: Normal appearing male, lying in bed.  HEENT: PERRLA, EOMI, Sclera anicteric, MMM, trachea midline. Tongue protrudes midline. NCAT. Prior craniotomy scar. Cardiology: RRR, no murmurs/rubs/gallops. BL radial and DP pulses equal bilaterally.  Resp: Normal respiratory rate and effort. CTAB, no wheezes, rhonchi, crackles.  Abd: Soft, non-tender, non-distended. No rebound tenderness or guarding.  GU: Deferred. MSK: No peripheral edema or signs of trauma. Extremities without deformity or TTP. No cyanosis or clubbing. Skin: warm, dry. No rashes or lesions. Back: No CVA tenderness Neuro: A&Ox1 (his baseline), CNs II-XII grossly intact. 1/5 strength in LUE, 3/5 strength in LLE, 5/5 strength in RUE/RLE. Sensation grossly intact. Marginally unintelligible, slurred speech. Psych: cooperative/pleasant affect  1a  Level of consciousness: 0=alert; keenly responsive  1b. LOC questions:  2=Performs neither task correctly  1c. LOC commands: 0=Performs both tasks correctly  2.  Best Gaze: 0=normal  3.  Visual: 0=No visual loss  4. Facial Palsy: 0=Normal symmetric movement  5a.  Motor left arm: 3=No effort against gravity, limb falls  5b.  Motor right arm: 0=No drift, limb holds 90 (or 45) degrees for full 10 seconds  6a. motor left leg: 2=Some effort against gravity, limb cannot get to or maintain (if cured) 90 (or 45) degrees, drifts down to bed, but has some effort against gravity  6b  Motor right leg:  0=No drift, limb holds 90 (or 45) degrees for full 10 seconds  7. Limb Ataxia: 0=Absent  8.  Sensory: 1=Mild to moderate sensory  loss; patient feels pinprick is less sharp or is dull on the affected side; there is a loss of superficial pain with pinprick but patient is aware He is being touched  9. Best Language:  0=No aphasia, normal  10. Dysarthria: 1=Mild to moderate, patient slurs at least some words and at worst, can be understood with some difficulty  11. Extinction and Inattention: 1=Visual, tactile, auditory, spatial or personal inattention or extinction to bilateral simultaneous stimulation in one of the sensory modalities   Total:   10        ED Results / Procedures / Treatments   Labs (all labs ordered are listed, but only abnormal results are displayed) Labs Reviewed  CBC WITH DIFFERENTIAL/PLATELET - Abnormal; Notable for the following components:      Result Value   WBC 2.7 (*)  Neutro Abs 1.1 (*)    All other components within normal limits  COMPREHENSIVE METABOLIC PANEL - Abnormal; Notable for the following components:   Sodium 134 (*)    Glucose, Bld 187 (*)    BUN 24 (*)    Calcium 8.1 (*)    Total Protein 6.2 (*)    Albumin 3.3 (*)    All other components within normal limits  LACTIC ACID, PLASMA - Abnormal; Notable for the following components:   Lactic Acid, Venous 2.2 (*)    All other components within normal limits  LIPID PANEL - Abnormal; Notable for the following components:   HDL 33 (*)    All other components within normal limits  BASIC METABOLIC PANEL - Abnormal; Notable for the following components:   Glucose, Bld 135 (*)    Calcium 8.6 (*)    All other components within normal limits  LACTIC ACID, PLASMA  MAGNESIUM  URINALYSIS, ROUTINE W REFLEX MICROSCOPIC  RAPID URINE DRUG SCREEN, HOSP PERFORMED    EKG EKG Interpretation  Date/Time:  Tuesday April 09 2023 10:58:52 EDT Ventricular Rate:  97 PR Interval:  127 QRS Duration: 114 QT Interval:  362 QTC Calculation: 460 R Axis:   28 Text Interpretation: Sinus rhythm Borderline intraventricular conduction delay  Confirmed by Vivi Barrack [phone]) on 04/09/2023 11:23:26 AM  Radiology  CT ANGIO HEAD NECK W WO CM (CODE STROKE)  Result Date: 04/09/2023 CLINICAL DATA:  Left-sided weakness. EXAM: CT ANGIOGRAPHY HEAD AND NECK WITH AND WITHOUT CONTRAST TECHNIQUE: Multidetector CT imaging of the head and neck was performed using the standard protocol during bolus administration of intravenous contrast. Multiplanar CT image reconstructions and MIPs were obtained to evaluate the vascular anatomy. Carotid stenosis measurements (when applicable) are obtained utilizing NASCET criteria, using the distal internal carotid diameter as the denominator. RADIATION DOSE REDUCTION: This exam was performed according to the departmental dose-optimization program which includes automated exposure control, adjustment of the mA and/or kV according to patient size and/or use of iterative reconstruction technique. CONTRAST:  75mL OMNIPAQUE IOHEXOL 350 MG/ML SOLN COMPARISON:  None Available. FINDINGS: CTA NECK FINDINGS Aortic arch: The imaged aortic arch is normal. The origins of the major branch vessels are patent. The subclavian arteries are patent to the level imaged. Right carotid system: The right common, internal, and external carotid arteries are patent, without hemodynamically significant stenosis or occlusion. There is no evidence of dissection or aneurysm. Left carotid system: The left common, internal, and external carotid arteries are patent, without hemodynamically significant stenosis or occlusion. There is no evidence of dissection or aneurysm. Vertebral arteries: The vertebral arteries are patent, without hemodynamically significant stenosis or occlusion there is no evidence of dissection or aneurysm. Skeleton: There is no acute osseous abnormality or suspicious osseous lesion. There is no visible canal hematoma. Other neck: The soft tissues of the neck are unremarkable. The esophagus is markedly patulous, as seen on prior CT chest  from 2021. There is debris in the upper trachea. Upper chest: The imaged lung apices are clear. Review of the MIP images confirms the above findings CTA HEAD FINDINGS Image quality is degraded by motion artifact at the skull base. Anterior circulation: The intracranial ICAs are patent without definite hemodynamically significant stenosis. The left MCAs are patent, without proximal high-grade stenosis or occlusion. There is diminished vascularity in the area of encephalomalacia in the right frontal lobe. The bilateral ACAs are patent, without proximal high-grade stenosis or occlusion. There is no aneurysm or AVM. Posterior circulation: The bilateral  V4 segments are patent. The basilar artery is patent but diminutive in caliber. The major cerebellar arteries appear patent. The bilateral PCAs are patent, without proximal high-grade stenosis or occlusion. The PCAs are supplied by prominent posterior communicating arteries bilaterally with diminutive P1 segments (fetal origins). There is no aneurysm or AVM. Venous sinuses: As permitted by contrast timing, patent. Anatomic variants: As above. Review of the MIP images confirms the above findings IMPRESSION: 1. Patent vasculature of the head and neck with no hemodynamically significant stenosis, occlusion, or dissection. 2. Patulous esophagus with debris in the upper trachea. Correlate with any signs or symptoms of aspiration. These results were called by telephone at the time of interpretation on 04/09/2023 at 1:44 pm to provider Dr. Amada Jupiter, who verbally acknowledged these results. Electronically Signed   By: Lesia Hausen M.D.   On: 04/09/2023 13:53   CT HEAD CODE STROKE WO CONTRAST`  Result Date: 04/09/2023 CLINICAL DATA:  Code stroke. Aphasia, facial droop. History of stroke with left-sided weakness. EXAM: CT HEAD WITHOUT CONTRAST TECHNIQUE: Contiguous axial images were obtained from the base of the skull through the vertex without intravenous contrast. RADIATION  DOSE REDUCTION: This exam was performed according to the departmental dose-optimization program which includes automated exposure control, adjustment of the mA and/or kV according to patient size and/or use of iterative reconstruction technique. COMPARISON:  CT head 10/02/2021, brain MRI 09/15/2021 FINDINGS: Brain: There is no evidence of acute intracranial hemorrhage, extra-axial fluid collection, or acute territorial infarct. A large area of encephalomalacia in the right frontal lobe with associated ex vacuo dilatation of the right lateral ventricle is unchanged. The ventricles are stable in size. Gray-white differentiation is otherwise preserved. The pituitary and suprasellar region are normal. There is no mass lesion. There is no mass effect or midline shift. Vascular: No hyperdense vessel or unexpected calcification. Skull: Postsurgical changes reflecting right frontal craniotomy are noted. There is no acute calvarial fracture. Sinuses/Orbits: The imaged paranasal sinuses are clear. The imaged globes and orbits are unremarkable. Other: None. ASPECTS [facility] Stroke Program Early CT Score) - Ganglionic level infarction (caudate, lentiform nuclei, internal capsule, insula, M1-M3 cortex): 7 - Supraganglionic infarction (M4-M6 cortex): 3 Total score (0-10 with 10 being normal): 10 IMPRESSION: No acute intracranial pathology. Unchanged large area of encephalomalacia in the right frontal lobe. These results were called by telephone at the time of interpretation on 04/09/2023 at 1:10 pm to provider Cranston Koors , who verbally acknowledged these results. Electronically Signed   By: Lesia Hausen M.D.   On: 04/09/2023 13:30    Procedures Procedures    Medications Ordered in ED Medications  lamoTRIgine (LAMICTAL) tablet 100 mg (100 mg Oral Given 04/09/23 2121)  iohexol (OMNIPAQUE) 350 MG/ML injection 75 mL (75 mLs Intravenous Contrast Given 04/09/23 1329)  sodium chloride 0.9 % bolus 500 mL (0 mLs Intravenous  Stopped 04/09/23 1548)    ED Course/ Medical Decision Making/ A&P                          Medical Decision Making Amount and/or Complexity of Data Reviewed Labs: ordered. Decision-making details documented in ED Course. Radiology: ordered.  Risk Prescription drug management. Decision regarding hospitalization.    This patient presents to the ED for concern of L sided weakness, confusion/slurred speech, possible LOC; this involves an extensive number of treatment options, and is a complaint that carries with it a high risk of complications and morbidity.  I considered the following differential and  admission for this acute, potentially life threatening condition.   MDM:    Given the acute onset of neurological symptoms, stroke is the most concerning etiology of these acute symptoms. The neuro exam is significant for slurred speech, LUE and LLE weakness. Also considered seizures w/ post-ictal state given patient's h/o seizures, and todd's paralysis. Also considered electrolyte derangements, hypo/hyperglycemia.  No report of head trauma, NCAT on exam.   Teleneurology team evaluating patient at bedside. Plan to obtain emergent CT brain.  LKN: 0800 - initially thought that LKN was yesterday, however on further history taking, patient's LKN actually determined to have been 0800, as he reportedly returned to baseline last night after his weakness resolved.    Clinical Course as of 04/10/23 1823  Tue Apr 09, 2023  1120 Glucose-Capillary(!): 176 [HN]  1120 Alcohol, Ethyl (B): <10 [HN]  1335 D/w Dr. Amada Jupiter, patient improving, suggesting that he likely had seizure and we are observing a post-ictal state. Recommending MRI. Also would be worth doing EEG to ensure he isn't having intermittent seizures given symptoms yesterday and today. Recommending admission to [facility] for continuous EEG monitoring. [HN]  1338 Urinalysis, Routine w reflex microscopic -Urine, Clean Catch Neg [HN]  1338  Rapid urine drug screen (hospital performed) [HN]  1338 CBC with Differential(!) Mild leukopenia/neutropenia [HN]  1338 Comprehensive metabolic panel(!) Mild uremia likely d/t mild dehydration [HN]  1339 Giving 500cc bolus NS. Consulting to hospitalist. [HN]    Clinical Course User Index [HN] Loetta Rough, MD    Labs: I Ordered, and personally interpreted labs.  The pertinent results include:  those listed above  Imaging Studies ordered: I ordered imaging studies including CTH, CTA H&N, MRI brain & prolonged EEG (pending I independently visualized and interpreted imaging. I agree with the radiologist interpretation  Additional history obtained from caregivers, chart review  Cardiac Monitoring: The patient was maintained on a cardiac monitor.  I personally viewed and interpreted the cardiac monitored which showed an underlying rhythm of: NSR  Reevaluation: After the interventions noted above, I reevaluated the patient and found that they have :stayed the same  Social Determinants of Health: Lives in group home  Disposition:  Neurology recommending MRI for further w/u and prolonged EEG. Will be given home depakote/lamictal w/ levels pending. Admit to hospitalist w/ neuro following  Co morbidities that complicate the patient evaluation  Past Medical History:  Diagnosis Date   Barrett esophagus    DM II (diabetes mellitus, type II), controlled 11/13/2020   Dysphagia    Gastroparesis    GERD (gastroesophageal reflux disease)    Hypercholesteremia    Hypertension    Iron deficiency anemia    Mental retardation    Schizophrenia    Seizure    Stroke      Medicines Meds ordered this encounter  Medications   iohexol (OMNIPAQUE) 350 MG/ML injection 75 mL   sodium chloride 0.9 % bolus 500 mL   DISCONTD: metoprolol tartrate (LOPRESSOR) tablet 25 mg   rosuvastatin (CRESTOR) tablet 20 mg   DISCONTD: QUEtiapine (SEROQUEL) tablet 400 mg   DISCONTD: levothyroxine  (SYNTHROID) tablet 75 mcg   docusate sodium (COLACE) capsule 100 mg   pantoprazole (PROTONIX) EC tablet 40 mg   polyethylene glycol (MIRALAX / GLYCOLAX) packet 17 g   DISCONTD: divalproex (DEPAKOTE) DR tablet 500 mg   lamoTRIgine (LAMICTAL) tablet 100 mg   cholecalciferol (VITAMIN D3) 25 MCG (1000 UNIT) tablet 2,000 Units   divalproex (DEPAKOTE) DR tablet 750 mg  stroke: early stages of recovery book   DISCONTD: 0.9 %  sodium chloride infusion   OR Linked Order Group    acetaminophen (TYLENOL) tablet 650 mg    acetaminophen (TYLENOL) 160 MG/5ML solution 650 mg    acetaminophen (TYLENOL) suppository 650 mg   senna-docusate (Senokot-S) tablet 2 tablet   enoxaparin (LOVENOX) injection 40 mg   ipratropium-albuterol (DUONEB) 0.5-2.5 (3) MG/3ML nebulizer solution 3 mL   guaiFENesin (ROBITUSSIN) 100 MG/5ML liquid 5 mL   traZODone (DESYREL) tablet 50 mg   ondansetron (ZOFRAN) injection 4 mg   labetalol (NORMODYNE) injection 10 mg   DISCONTD: metoprolol tartrate (LOPRESSOR) tablet 25 mg   DISCONTD: dextrose 5 %-0.45 % sodium chloride infusion   QUEtiapine (SEROQUEL) tablet 400 mg   levothyroxine (SYNTHROID) tablet 175 mcg    I have reviewed the patients home medicines and have made adjustments as needed  Problem List / ED Course: Problem List Items Addressed This Visit       Other   Altered mental status   Other Visit Diagnoses     Left-sided weakness    -  Primary                   This note was created using dictation software, which may contain spelling or grammatical errors.    Loetta Rough, MD 04/12/23 [phone]

## 2023-04-09 NOTE — Progress Notes (Signed)
EEG complete - results pending 

## 2023-04-09 NOTE — H&P (Signed)
History and Physical    [name] ZOX:[id number] DOB: 08-27-1956 DOA: 04/09/2023  PCP: Lucretia Field Patient coming from: Group home  Chief Complaint: Weakness or seizures  HPI: [name] is a 67 y.o. male with medical history significant of benign intracranial tumor status post craniotomy 2004, complex partial seizure, schizophrenia, intellectual disability/delay, dysphagia, Barrett's esophagus, GERD, hypothyroidism, HTN, HLD, previous CVA with residual left-sided weakness.  History is provided by the group home staff who is present at bedside and their manager Shervis over the phone.  At baseline patient ambulates with a walker and able to enunciate words but over the course of past 2 weeks patient has had intermittent episodes of increasing left-sided weakness and slurred speech.  Each episode lasting for few seconds to few minutes.  About a week ago his Depakote levels were low therefore it was increased from 500 mg to 750 mg.  Over last 24 hours the group home has noticed increase in weakness and decreased oral intake and this morning patient was not communicating at all therefore brought to the hospital.  He did not see any obvious evidence of generalized tonic-clonic seizures.   Due to his weakness and fall prior to ED arrival, code stroke was called.  CT of the head was negative.  Patient was seen by teleneurology who suspected likely seizure episode.  Recommending MRI brain, EEG and admitting patient to [facility].   Review of Systems: As per HPI otherwise 10 point review of systems negative.  Review of Systems Otherwise negative except as per HPI, including: General: Denies fever, chills, night sweats or unintended weight loss. Resp: Denies cough, wheezing, shortness of breath. Cardiac: Denies chest pain, palpitations, orthopnea, paroxysmal nocturnal dyspnea. GI: Denies abdominal pain, nausea, vomiting, diarrhea or constipation GU: Denies dysuria,  frequency, hesitancy or incontinence MS: Denies muscle aches, joint pain or swelling Neuro: Denies headache, neurologic deficits (focal weakness, numbness, tingling), abnormal gait Psych: Denies anxiety, depression, SI/HI/AVH Skin: Denies new rashes or lesions ID: Denies sick contacts, exotic exposures, travel  Past Medical History:  Diagnosis Date   Barrett esophagus    DM II (diabetes mellitus, type II), controlled 11/13/2020   Dysphagia    Gastroparesis    GERD (gastroesophageal reflux disease)    Hypercholesteremia    Hypertension    Iron deficiency anemia    Mental retardation    Schizophrenia    Seizure    Stroke     Past Surgical History:  Procedure Laterality Date   BIOPSY  10/21/2020   Procedure: BIOPSY;  Surgeon: Iva Boop, MD;  Location: [facility] ENDOSCOPY;  Service: Endoscopy;;   CRANIOTOMY FOR CYST FENESTRATION     ESOPHAGOGASTRODUODENOSCOPY N/A 01/09/2015   Procedure: ESOPHAGOGASTRODUODENOSCOPY (EGD);  Surgeon: Louis Meckel, MD;  Location: [facility] ENDOSCOPY;  Service: Endoscopy;  Laterality: N/A;   ESOPHAGOGASTRODUODENOSCOPY N/A 10/26/2020   Procedure: ESOPHAGOGASTRODUODENOSCOPY (EGD);  Surgeon: Corliss Skains, MD;  Location: [facility] OR;  Service: Thoracic;  Laterality: N/A;   ESOPHAGOGASTRODUODENOSCOPY (EGD) WITH PROPOFOL N/A 10/21/2020   Procedure: ESOPHAGOGASTRODUODENOSCOPY (EGD) WITH PROPOFOL;  Surgeon: Iva Boop, MD;  Location: [facility] ENDOSCOPY;  Service: Endoscopy;  Laterality: N/A;   IR GASTR TUBE CONVERT GASTR-JEJ PER W/FL MOD SED  11/07/2020   PEG PLACEMENT N/A 10/26/2020   Procedure: PERCUTANEOUS ENDOSCOPIC GASTROSTOMY (PEG) PLACEMENT;  Surgeon: Corliss Skains, MD;  Location: [facility] OR;  Service: Thoracic;  Laterality: N/A;   XI ROBOTIC ASSISTED HIATAL HERNIA REPAIR N/A 10/26/2020   Procedure: XI ROBOTIC ASSISTED  HIATAL HERNIA REPAIR;  Surgeon: Corliss Skains, MD;  Location: [facility] OR;  Service: Thoracic;  Laterality: N/A;    SOCIAL HISTORY:  reports  that he has never smoked. He has never used smokeless tobacco. He reports that he does not drink alcohol and does not use drugs.  Allergies  Allergen Reactions   Vancomycin Other (See Comments) and Itching    Unknown-possible hives  Unknown-possible hives, Hives.    FAMILY HISTORY: Family History  Problem Relation Age of Onset   Breast cancer Mother    Heart disease Father      Prior to Admission medications   Medication Sig Start Date End Date Taking? Authorizing Provider  Cholecalciferol (VITAMIN D) 50 MCG (2000 UT) tablet Take 1 tablet (2,000 Units total) by mouth daily. 12/06/20   Angiulli, Mcarthur Rossetti, PA-C  divalproex (DEPAKOTE) 500 MG DR tablet Take 1 tablet (500 mg total) by mouth every 12 (twelve) hours. 04/01/23   Levert Feinstein, MD  docusate sodium (COLACE) 100 MG capsule Take 1 capsule (100 mg total) by mouth 2 (two) times daily. 12/06/20   Angiulli, Mcarthur Rossetti, PA-C  Emollient (CORN HUSKERS EX) Apply 1 application topically in the morning and at bedtime. For dry skin    [provider]  lamoTRIgine (LAMICTAL) 100 MG tablet Take 1 tablet (100 mg total) by mouth at bedtime. 04/01/23   Levert Feinstein, MD  levothyroxine (SYNTHROID) 75 MCG tablet Take 1 tablet (75 mcg total) by mouth daily at 6 (six) AM. 12/07/20   Angiulli, Mcarthur Rossetti, PA-C  metoprolol tartrate (LOPRESSOR) 25 MG tablet Take 1 tablet (25 mg total) by mouth 2 (two) times daily. 07/10/22   Raulkar, Drema Pry, MD  ondansetron (ZOFRAN) 4 MG tablet Take 4 mg by mouth 4 (four) times daily as needed for nausea or vomiting.    [provider]  pantoprazole (PROTONIX) 40 MG tablet Take 1 tablet (40 mg total) by mouth 2 (two) times daily before a meal. 12/06/20   Angiulli, Mcarthur Rossetti, PA-C  polyethylene glycol (MIRALAX / GLYCOLAX) 17 g packet Take 17 g by mouth 2 (two) times daily. 12/06/20   Angiulli, Mcarthur Rossetti, PA-C  QUEtiapine (SEROQUEL) 400 MG tablet Take 1 tablet (400 mg total) by mouth 2 (two) times daily. 12/06/20    Angiulli, Mcarthur Rossetti, PA-C  rosuvastatin (CRESTOR) 20 MG tablet Take 1 tablet (20 mg total) by mouth daily. 12/07/20   Charlton Amor, PA-C    Physical Exam: Vitals:   04/09/23 0955 04/09/23 0959  BP:  117/62  Pulse:  (!) 110  Resp:  19  Temp:  98.2 F (36.8 C)  TempSrc:  Axillary  SpO2:  94%  Weight: 65 kg   Height:  (1.6 m)       Constitutional: NAD, calm, comfortable Eyes: PERRL, lids and conjunctivae normal ENMT: Mucous membranes are moist. Posterior pharynx clear of any exudate or lesions.Normal dentition.  Neck: normal, supple, no masses, no thyromegaly Respiratory: clear to auscultation bilaterally, no wheezing, no crackles. Normal respiratory effort. No accessory muscle use.  Cardiovascular: Regular rate and rhythm, no murmurs / rubs / gallops. No extremity edema. 2+ pedal pulses. No carotid bruits.  Abdomen: no tenderness, no masses palpated. No hepatosplenomegaly. Bowel sounds positive.  Musculoskeletal: no clubbing / cyanosis. No joint deformity upper and lower extremities. Good ROM, no contractures. Normal muscle tone.  Skin: no rashes, lesions, ulcers. No induration Neurologic: CN 2-12 grossly intact. Sensation intact, DTR normal. Strength 5/5 in all 4.  Psychiatric: Normal judgment and insight. Alert and oriented x 3. Normal mood.     Labs on Admission: I have personally reviewed following labs and imaging studies  CBC: Recent Labs  Lab 04/09/23 1003  WBC 2.7*  NEUTROABS 1.1*  HGB 13.3  HCT 40.7  MCV 95.3  PLT 153   Basic Metabolic Panel: Recent Labs  Lab 04/09/23 1003  NA 134*  K 3.5  CL 101  CO2 25  GLUCOSE 187*  BUN 24*  CREATININE 1.17  CALCIUM 8.1*  MG 2.3   GFR: Estimated Creatinine Clearance: 50.7 mL/min (by C-G formula based on SCr of 1.17 mg/dL). Liver Function Tests: Recent Labs  Lab 04/09/23 1003  AST 23  ALT 13  ALKPHOS 48  BILITOT 0.5  PROT 6.2*  ALBUMIN 3.3*   No results for input(s): "LIPASE", "AMYLASE" in  the last 168 hours. No results for input(s): "AMMONIA" in the last 168 hours. Coagulation Profile: No results for input(s): "INR", "PROTIME" in the last 168 hours. Cardiac Enzymes: No results for input(s): "CKTOTAL", "CKMB", "CKMBINDEX", "TROPONINI" in the last 168 hours. BNP (last 3 results) No results for input(s): "PROBNP" in the last 8760 hours. HbA1C: No results for input(s): "HGBA1C" in the last 72 hours. CBG: Recent Labs  Lab 04/09/23 1055  GLUCAP 176*   Lipid Profile: No results for input(s): "CHOL", "HDL", "LDLCALC", "TRIG", "CHOLHDL", "LDLDIRECT" in the last 72 hours. Thyroid Function Tests: No results for input(s): "TSH", "T4TOTAL", "FREET4", "T3FREE", "THYROIDAB" in the last 72 hours. Anemia Panel: No results for input(s): "VITAMINB12", "FOLATE", "FERRITIN", "TIBC", "IRON", "RETICCTPCT" in the last 72 hours. Urine analysis:    Component Value Date/Time   COLORURINE YELLOW 04/09/2023 1004   APPEARANCEUR CLEAR 04/09/2023 1004   LABSPEC 1.021 04/09/2023 1004   PHURINE 5.0 04/09/2023 1004   GLUCOSEU NEGATIVE 04/09/2023 1004   HGBUR NEGATIVE 04/09/2023 1004   BILIRUBINUR NEGATIVE 04/09/2023 1004   KETONESUR NEGATIVE 04/09/2023 1004   PROTEINUR NEGATIVE 04/09/2023 1004   UROBILINOGEN 1.0 07/23/2015 2300   NITRITE NEGATIVE 04/09/2023 1004   LEUKOCYTESUR NEGATIVE 04/09/2023 1004   Sepsis Labs: !!!!!!!!!!!!!!!!!!!!!!!!!!!!!!!!!!!!!!!!!!!! @LABRCNTIP (procalcitonin:4,lacticidven:4) )No results found for this or any previous visit (from the past 240 hour(s)).   Radiological Exams on Admission: CT HEAD CODE STROKE WO CONTRAST`  Result Date: 04/09/2023 CLINICAL DATA:  Code stroke. Aphasia, facial droop. History of stroke with left-sided weakness. EXAM: CT HEAD WITHOUT CONTRAST TECHNIQUE: Contiguous axial images were obtained from the base of the skull through the vertex without intravenous contrast. RADIATION DOSE REDUCTION: This exam was performed according to the  departmental dose-optimization program which includes automated exposure control, adjustment of the mA and/or kV according to patient size and/or use of iterative reconstruction technique. COMPARISON:  CT head 10/02/2021, brain MRI 09/15/2021 FINDINGS: Brain: There is no evidence of acute intracranial hemorrhage, extra-axial fluid collection, or acute territorial infarct. A large area of encephalomalacia in the right frontal lobe with associated ex vacuo dilatation of the right lateral ventricle is unchanged. The ventricles are stable in size. Gray-white differentiation is otherwise preserved. The pituitary and suprasellar region are normal. There is no mass lesion. There is no mass effect or midline shift. Vascular: No hyperdense vessel or unexpected calcification. Skull: Postsurgical changes reflecting right frontal craniotomy are noted. There is no acute calvarial fracture. Sinuses/Orbits: The imaged paranasal sinuses are clear. The imaged globes and orbits are unremarkable. Other: None. ASPECTS [facility] Stroke Program Early CT Score) - Ganglionic level infarction (caudate, lentiform nuclei, internal capsule, insula, M1-M3 cortex): 7 -  Supraganglionic infarction (M4-M6 cortex): 3 Total score (0-10 with 10 being normal): 10 IMPRESSION: No acute intracranial pathology. Unchanged large area of encephalomalacia in the right frontal lobe. These results were called by telephone at the time of interpretation on 04/09/2023 at 1:10 pm to provider HAYLEY NAASZ , who verbally acknowledged these results. Electronically Signed   By: Lesia Hausen M.D.   On: 04/09/2023 13:30     All images have been reviewed by me personally.  EKG: Independently reviewed. NSR  Assessment/Plan Principal Problem:   Weakness Active Problems:   Hypothyroidism   Schizophrenia, unspecified type   Hemiplegia (HCC)   GERD   S/P repair of paraesophageal hernia   DM II (diabetes mellitus, type II), controlled   S/P percutaneous endoscopic  gastrostomy (PEG) tube placement   S/P craniotomy   General weakness    Significant generalized weakness History of CVA with residual left-sided weakness - Admit patient to [facility] per teleneurology recommendation.  CT of the head negative. CTA = no LVO  Will obtain MRI brain and EEG.  Neurochecks.  Check A1c, lipid panel.  Supportive care.  Eventually will need PT/OT, S&S eval UA and UDS- neg.   Hx of Tonic Clonic Seizures Concerns about seizure, ordered Depakote and Lamictal. Check Depakote and Lamictal levels.  EEG ordered Neuro consulted.   GERD/Barret's esophagus -PPI  Hypothyroidism -Synthroid  HTN Hold metoprolol for permissive HTN.   Intellectual disability -On Seroquel.   HLD -Crestor, checking Lipid Panel  Hx Of Benign Tumor s/p Craniotomy    DVT prophylaxis: SQ Lovenox Code Status: Full Family Communication: Group Home staff, and sister [name] [facility]) Consults called: Neurology Admission status: Obs Admit to Tele  Status is: Observation The patient remains OBS appropriate and will d/c before 2 midnights.   Time Spent: 65 minutes.  >50% of the time was devoted to discussing the patients care, assessment, plan and disposition with other care givers along with counseling the patient about the risks and benefits of treatment.    Acire Tang Joline Maxcy MD [facility]  If 7PM-7AM, please contact night-coverage   04/09/2023, 1:55 PM

## 2023-04-09 NOTE — Telephone Encounter (Signed)
Called sister and patient is now in ED for a possible stroke, sister is saying that she wants to know what next steps should be with Depakote, considering at last visit pt was told to continue  but MD at facility raised it to 750 because his level was low. Please advise

## 2023-04-09 NOTE — Consult Note (Signed)
Telestroke cart was activated at 1253. Per treatment team, pt's LKW was at 0800 with c/o L sided weakness.Dr. Mayme Genta, EDP at bedside assessing patient prior to cart activation. Pt transported to CT at 1253 and returned to room at 1340. [facility] was paged for code stroke at 1254. Dr. Amada Jupiter, [facility] appeared on telestroke cart at 1259 to assess the patient. Based on [facility]'s assessment, pt does not meet criteria for emergent interventions at this time 2/2 onset of symptoms are outside of window and no LVO. [facility] to f/u with EDP regarding recommendations. No further needs from Telestroke RN at this time. Telestroke cart disconnected at 1341.

## 2023-04-10 ENCOUNTER — Observation Stay (HOSPITAL_COMMUNITY): Payer: Medicare Other

## 2023-04-10 DIAGNOSIS — R4182 Altered mental status, unspecified: Secondary | ICD-10-CM | POA: Diagnosis not present

## 2023-04-10 DIAGNOSIS — R569 Unspecified convulsions: Secondary | ICD-10-CM

## 2023-04-10 DIAGNOSIS — R531 Weakness: Secondary | ICD-10-CM | POA: Diagnosis not present

## 2023-04-10 LAB — GLUCOSE, CAPILLARY
Glucose-Capillary: 142 mg/dL — ABNORMAL HIGH (ref 70–99)
Glucose-Capillary: 113 mg/dL — ABNORMAL HIGH (ref 70–99)
Glucose-Capillary: 113 mg/dL — ABNORMAL HIGH (ref 70–99)
Glucose-Capillary: 150 mg/dL — ABNORMAL HIGH (ref 70–99)
Glucose-Capillary: 105 mg/dL — ABNORMAL HIGH (ref 70–99)

## 2023-04-10 LAB — CBC
HCT: 42.3 % (ref 39.0–52.0)
Hemoglobin: 14.8 g/dL (ref 13.0–17.0)
MCH: 32 pg (ref 26.0–34.0)
MCHC: 35 g/dL (ref 30.0–36.0)
MCV: 91.4 fL (ref 80.0–100.0)
Platelets: 154 10*3/uL (ref 150–400)
RBC: 4.63 MIL/uL (ref 4.22–5.81)
RDW: 11.6 % (ref 11.5–15.5)
WBC: 6.2 10*3/uL (ref 4.0–10.5)
nRBC: 0 % (ref 0.0–0.2)

## 2023-04-10 LAB — LIPID PANEL
Cholesterol: 89 mg/dL (ref 0–200)
HDL: 33 mg/dL — ABNORMAL LOW (ref >40)
LDL Cholesterol: 38 mg/dL (ref 0–99)
Total CHOL/HDL Ratio: 2.7 RATIO
Triglycerides: 92 mg/dL (ref <150)
VLDL: 18 mg/dL (ref 0–40)

## 2023-04-10 LAB — FOLATE
Folate: 11.3 ng/mL (ref >5.9)
Folate: 12.2 ng/mL (ref >5.9)

## 2023-04-10 LAB — TSH: TSH: 2.742 u[IU]/mL (ref 0.350–4.500)

## 2023-04-10 LAB — BASIC METABOLIC PANEL
Anion gap: 12 (ref 5–15)
BUN: 12 mg/dL (ref 8–23)
CO2: 24 mmol/L (ref 22–32)
Calcium: 8.6 mg/dL — ABNORMAL LOW (ref 8.9–10.3)
Chloride: 101 mmol/L (ref 98–111)
Creatinine, Ser: 1.09 mg/dL (ref 0.61–1.24)
GFR, Estimated: >60 mL/min (ref >60)
Glucose, Bld: 135 mg/dL — ABNORMAL HIGH (ref 70–99)
Potassium: 4 mmol/L (ref 3.5–5.1)
Sodium: 137 mmol/L (ref 135–145)

## 2023-04-10 LAB — VITAMIN B12: Vitamin B-12: 588 pg/mL (ref 180–914)

## 2023-04-10 LAB — LAMOTRIGINE LEVEL: Lamotrigine Lvl: 10 ug/mL (ref 2.0–20.0)

## 2023-04-10 LAB — MAGNESIUM: Magnesium: 2.3 mg/dL (ref 1.7–2.4)

## 2023-04-10 MED ORDER — LEVOTHYROXINE SODIUM 75 MCG PO TABS
175.0000 ug | ORAL_TABLET | Freq: Every day | ORAL | Status: DC
  Administered 2023-04-11 – 2023-04-15 (×5): 175 ug via ORAL
  Filled 2023-04-10 (×5): qty 1

## 2023-04-10 NOTE — Plan of Care (Signed)
  Problem: Education: Goal: Knowledge of patient specific risk factors will improve Loraine Leriche N/A or DELETE if not current risk factor) Outcome: Progressing   Problem: Health Behavior/Discharge Planning: Goal: Goals will be collaboratively established with patient/family Outcome: Progressing   Problem: Self-Care: Goal: Ability to participate in self-care as condition permits will improve Outcome: Progressing   Problem: Nutrition: Goal: Risk of aspiration will decrease Outcome: Progressing Goal: Dietary intake will improve Outcome: Progressing

## 2023-04-10 NOTE — Telephone Encounter (Signed)
Called and spoke to [place] and stated that she will need to call us once the pt is discharged to schedule a appt to discuss this further.

## 2023-04-10 NOTE — Telephone Encounter (Signed)
Returned a call to [employer] and she stated that she is concerned as to why the provider in his [facility] group home changed Depakote without consulting Dr.YAN and thinks this may be the very reason he is hospitalized at the moment. The provider who changed the the dose claims they sent over information to [facility] notifying the dose was changed.

## 2023-04-10 NOTE — Plan of Care (Signed)
  Problem: Education: Goal: Knowledge of disease or condition will improve Outcome: Not Progressing Goal: Knowledge of secondary prevention will improve (MUST DOCUMENT ALL) Outcome: Not Progressing Goal: Knowledge of patient specific risk factors will improve (Mark N/A or DELETE if not current risk factor) Outcome: Not Progressing   Problem: Ischemic Stroke/TIA Tissue Perfusion: Goal: Complications of ischemic stroke/TIA will be minimized Outcome: Not Progressing   Problem: Coping: Goal: Will verbalize positive feelings about self Outcome: Not Progressing Goal: Will identify appropriate support needs Outcome: Not Progressing   Problem: Health Behavior/Discharge Planning: Goal: Ability to manage health-related needs will improve Outcome: Not Progressing Goal: Goals will be collaboratively established with patient/family Outcome: Not Progressing   Problem: Self-Care: Goal: Ability to participate in self-care as condition permits will improve Outcome: Not Progressing Goal: Verbalization of feelings and concerns over difficulty with self-care will improve Outcome: Not Progressing Goal: Ability to communicate needs accurately will improve Outcome: Not Progressing   Problem: Nutrition: Goal: Risk of aspiration will decrease Outcome: Not Progressing Goal: Dietary intake will improve Outcome: Not Progressing   Problem: Education: Goal: Knowledge of General Education information will improve Description: Including pain rating scale, medication(s)/side effects and non-pharmacologic comfort measures Outcome: Not Progressing   Problem: Health Behavior/Discharge Planning: Goal: Ability to manage health-related needs will improve Outcome: Not Progressing   Problem: Clinical Measurements: Goal: Ability to maintain clinical measurements within normal limits will improve Outcome: Not Progressing Goal: Will remain free from infection Outcome: Not Progressing Goal: Diagnostic test  results will improve Outcome: Not Progressing Goal: Respiratory complications will improve Outcome: Not Progressing Goal: Cardiovascular complication will be avoided Outcome: Not Progressing   Problem: Activity: Goal: Risk for activity intolerance will decrease Outcome: Not Progressing   Problem: Nutrition: Goal: Adequate nutrition will be maintained Outcome: Not Progressing   Problem: Coping: Goal: Level of anxiety will decrease Outcome: Not Progressing   Problem: Elimination: Goal: Will not experience complications related to bowel motility Outcome: Not Progressing Goal: Will not experience complications related to urinary retention Outcome: Not Progressing   Problem: Pain Managment: Goal: General experience of comfort will improve Outcome: Not Progressing   Problem: Safety: Goal: Ability to remain free from injury will improve Outcome: Not Progressing   Problem: Skin Integrity: Goal: Risk for impaired skin integrity will decrease Outcome: Not Progressing   

## 2023-04-10 NOTE — Progress Notes (Signed)
vLTM maintenance  All impedances below 10kohm.  No skin breakdown noted at all skin sites 

## 2023-04-10 NOTE — Evaluation (Signed)
Occupational Therapy Evaluation Patient Details Name: [name] MRN: [id number] DOB: 05/20/1956 Today's Date: 04/10/2023   History of Present Illness Pt is a 67 y.o. male who presented 04/09/23 with decreased responsiveness followed by worsening of his underlying symptoms. CT of head showed no acute intracranial pathology. Pt being worked up for potential seizure. 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 Unclear to what extent pt is assisted for bathing, dressing and toileting, no family available and pt unable to provide PLOF due to baseline cognitive deficits. Pt presents with self limiting behavior, needing maximum encouragement to mobilize. He needs +2 assist for bed mobility and to partially stand at EOB momentarily. Pt with longstanding L hemiparesis, may have worsened. Decreased regard for visual targets on R. Pt is able to self feed with set up at bed level, but did not demonstrate any further ADLs. Optimal discharge environment would be to his familiar group home, pending progress and ability of staff to assist him.      Recommendations for follow up therapy are one component of a multi-disciplinary discharge planning process, led by the attending physician.  Recommendations may be updated based on patient status, additional functional criteria and insurance authorization.   Assistance Recommended at Discharge Frequent or constant Supervision/Assistance  Patient can return home with the following A lot of help with walking and/or transfers;A lot of help with bathing/dressing/bathroom;Assistance with cooking/housework;Assistance with feeding;Direct supervision/assist for medications  management;Direct supervision/assist for financial management;Assist for transportation;Help with stairs or ramp for entrance    Functional Status Assessment  Patient has had a recent decline in their functional status and demonstrates the ability to make significant improvements in function in a reasonable and predictable amount of time.  Equipment Recommendations  BSC/3in1    Recommendations for Other Services       Precautions / Restrictions Precautions Precautions: Fall;Other (comment) Precaution Comments: EEG; can be resistive Restrictions Weight Bearing Restrictions: No      Mobility Bed Mobility Overal bed mobility: Needs Assistance Bed Mobility: Sit to Supine       Sit to supine: Mod assist, +2 for physical assistance, HOB elevated, +2 for safety/equipment   General bed mobility comments: Pt seated EOB with PT upon arrival, max encouragement to sit EOB and to stand. Pt attempting to return to supine. Pt needing guidance of trunk and assist of LEs to return to supine.    Transfers Overall transfer level: Needs assistance Equipment used: Rolling walker (2 wheels) Transfers: Sit to/from Stand Sit to Stand: Max assist, +2 safety/equipment           General transfer comment: Pt eventually agreeable to come to partial stand to allow therapists to fix his bed pad, demonstrating poor L extremity placement, retropulsion, and instability, needing maxA to prevent LOB, +2 for safety      Balance Overall balance assessment: Needs assistance Sitting-balance support: Single extremity supported, Feet supported Sitting balance-Leahy Scale: Poor Sitting balance - Comments: Reliant on R UE on bed rail, posterior lean, needing minA-min guard for static sitting balance   Standing balance support: Single extremity supported Standing balance-Leahy Scale: Zero Standing balance comment: L UE maintained on walker for him, stood under the guise of needing to straighten bed pads, pt  only coming to partial stand with 2 person assist to rise and steady                           ADL either performed or assessed with clinical judgement   ADL Overall ADL's : Needs assistance/impaired Eating/Feeding: Set up;Bed level                                     General ADL Comments: Pt only observed to self feed. Resistant to any further simulated or non simulated ADLs.     Vision   Additional Comments: tends to regard visual targets on L>R, ? R inattention vs field cut     Perception Perception Comments: unable to conform to requirements of vision/perception testing   Praxis      Pertinent Vitals/Pain Pain Assessment Pain Assessment: Faces Faces Pain Scale: Hurts a little bit Pain Location: generalized Pain Descriptors / Indicators: Guarding Pain Intervention(s): Repositioned     Hand Dominance Right   Extremity/Trunk Assessment Upper Extremity Assessment Upper Extremity Assessment: LUE deficits/detail LUE Deficits / Details: longstanding hemiparesis, assist to place and maintain on RW LUE Coordination: decreased fine motor;decreased gross motor   Lower Extremity Assessment Lower Extremity Assessment: Defer to PT evaluation LLE Deficits / Details: Minimal active movement of L leg, primarily appears to have active movement in hip if any; difficult to assess as pt not following cues to try to actively move; unsure to what extent is baseline as pt has hx of L sided weakness from prior CVA   Cervical / Trunk Assessment Cervical / Trunk Assessment: Normal   Communication Communication Communication: Expressive difficulties   Cognition Arousal/Alertness: Awake/alert, Lethargic Behavior During Therapy: Flat affect Overall Cognitive Status: History of cognitive impairments - at baseline                                   General Comments       Exercises     Shoulder Instructions      Home Living Family/patient  expects to be discharged to:: Group home                                        Prior Functioning/Environment Prior Level of Function : Needs assist;Patient poor historian/Family not available             Mobility Comments: Per chart, pt was ambulatory with a walker, but no family present to confirm PLOF info ADLs Comments: could self feed, unclear of pt's ability to complete self care, no family available        OT Problem List: Decreased strength;Decreased activity tolerance;Impaired balance (sitting and/or standing);Decreased coordination;Decreased cognition;Decreased safety awareness;Decreased knowledge of use of DME or AE;Impaired UE functional use      OT Treatment/Interventions: Self-care/ADL training;DME and/or AE instruction;Patient/family education;Balance training;Therapeutic activities    OT Goals(Current goals can be found in the care plan section) Acute Rehab OT Goals OT Goal Formulation: Patient unable to participate in goal setting Time For Goal Achievement: 05/01/23 Potential to Achieve Goals: Fair  OT Frequency: Min 2X/week    Co-evaluation PT/OT/SLP Co-Evaluation/Treatment: Yes Reason for Co-Treatment: Necessary to address cognition/behavior during functional activity PT goals addressed during session: Mobility/safety with  mobility;Balance;Proper use of DME        AM-PAC OT "6 Clicks" Daily Activity     Outcome Measure Help from another person eating meals?: A Little Help from another person taking care of personal grooming?: A Little Help from another person toileting, which includes using toliet, bedpan, or urinal?: Total Help from another person bathing (including washing, rinsing, drying)?: Total Help from another person to put on and taking off regular upper body clothing?: Total Help from another person to put on and taking off regular lower body clothing?: Total 6 Click Score: 10   End of Session Equipment Utilized During  Treatment: Rolling walker (2 wheels)  Activity Tolerance: Other (comment) (active resistance, appeared cognitive) Patient left: in bed;with call bell/phone within reach;with bed alarm set  OT Visit Diagnosis: Unsteadiness on feet (R26.81);Muscle weakness (generalized) (M62.81);Other symptoms and signs involving cognitive function                Time: 1240-1250 OT Time Calculation (min): 10 min Charges:  OT General Charges $OT Visit: 1 Visit OT Evaluation $OT Eval Moderate Complexity: 1 Mod  Berna Spare, OTR/L Acute Rehabilitation Services Office: [phone]   Evern Bio 04/10/2023, 2:03 PM

## 2023-04-10 NOTE — Evaluation (Signed)
Physical Therapy Evaluation Patient Details Name: [name] MRN: [id number] DOB: August 25, 1956 Today's Date: 04/10/2023  History of Present Illness  Pt is a 67 y.o. male who presented 04/09/23 with decreased responsiveness followed by worsening of his underlying symptoms. CT of head showed no acute intracranial pathology. Pt being worked up for potential seizure. 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 Pt has a hx of cognitive deficits and no family present to provide PLOF/home info. However, per chart, pt was ambulatory with a walker PTA, living in a group home. Currently, pt is self-limiting, actively resisting mobility at times, needing max coaxing to get pt to participate. As a result of this, pt required total assist to transition supine to sit EOB but modAx2 to return to supine as pt was trying to return himself to supine often. Pt demonstrates a posterior lean in sitting and standing, needing min guard-minA for static sitting balance and maxA for transfers and static standing balance. He displays L-sided weakness, but it is unclear how much is his residual weakness vs new worsening weakness. In addition, he demonstrates questionable L inattention. As pt has a hx of cognitive deficits and can be self-limiting, he would likely do better returning to his familiar environment. Thus, recommending follow-up with PT at his group home. However, if they cannot provide the assistance pt requires, then he may benefit from short-term inpatient rehab, <3 hours/day. Will continue to follow acutely.     Recommendations for follow up therapy are one component of a multi-disciplinary discharge planning process, led by the  attending physician.  Recommendations may be updated based on patient status, additional functional criteria and insurance authorization.  Follow Up Recommendations       Assistance Recommended at Discharge Frequent or constant Supervision/Assistance  Patient can return home with the following  Two people to help with walking and/or transfers;A lot of help with bathing/dressing/bathroom;Assistance with cooking/housework;Direct supervision/assist for medications management;Direct supervision/assist for financial management;Assist for transportation;Help with stairs or ramp for entrance    Equipment Recommendations BSC/3in1;Wheelchair (measurements PT);Wheelchair cushion (measurements PT)  Recommendations for Other Services       Functional Status Assessment Patient has had a recent decline in their functional status and demonstrates the ability to make significant improvements in function in a reasonable and predictable amount of time.     Precautions / Restrictions Precautions Precautions: Fall;Other (comment) Precaution Comments: EEG; can be resistive Restrictions Weight Bearing Restrictions: No      Mobility  Bed Mobility Overal bed mobility: Needs Assistance Bed Mobility: Supine to Sit, Sit to Supine     Supine to sit: Total assist, HOB elevated Sit to supine: Mod assist, +2 for physical assistance, HOB elevated, +2 for safety/equipment   General bed mobility comments: Pt kicking his R leg upon sitting up and reaching to grab R bed rail with R UE, but needed total assist to sit up due to actively resisting therapist's attempts to cue him to assist. Pt needing physical assist to advance his L extremities to sit up EOB. ModAx2 to manage L extremities and direct trunk safely to midline of bed as pt would try to fling himself posteriorly, perpendicular to the bed.    Transfers Overall transfer level: Needs assistance Equipment used: Rolling walker (2 wheels) Transfers: Sit  to/from Stand Sit to Stand: Max assist, +2 safety/equipment           General transfer comment: Pt eventually agreeable to come to partial stand to allow therapists to fix his bed pad, demonstrating poor L extremity placement, retropulsion, and instability, needing maxA to prevent LOB, +2 for safety    Ambulation/Gait               General Gait Details: pt declining to attempt  Stairs            Wheelchair Mobility    Modified Rankin (Stroke Patients Only) Modified Rankin (Stroke Patients Only) Pre-Morbid Rankin Score: Moderate disability Modified Rankin: Severe disability     Balance Overall balance assessment: Needs assistance Sitting-balance support: Single extremity supported, Feet supported Sitting balance-Leahy Scale: Poor Sitting balance - Comments: Reliant on R UE on bed rail, posterior lean, needing minA-min guard for static sitting balance Postural control: Posterior lean Standing balance support: Single extremity supported Standing balance-Leahy Scale: Poor Standing balance comment: MaxA and R UE support to come to partial stand with noted retropulsion                             Pertinent Vitals/Pain Pain Assessment Pain Assessment: Faces Faces Pain Scale: Hurts a little bit Pain Location: pt actively withdrawing with generalized touch Pain Descriptors / Indicators: Guarding Pain Intervention(s): Limited activity within patient's tolerance, Monitored during session, Repositioned    Home Living Family/patient expects to be discharged to:: Group home                        Prior Function Prior Level of Function : Needs assist;Patient poor historian/Family not available             Mobility Comments: Per chart, pt was ambulatory with a walker, but no family present to confirm PLOF info       Hand Dominance        Extremity/Trunk Assessment   Upper Extremity Assessment Upper Extremity Assessment: Defer to OT  evaluation    Lower Extremity Assessment Lower Extremity Assessment: LLE deficits/detail LLE Deficits / Details: Minimal active movement of L leg, primarily appears to have active movement in hip if any; difficult to assess as pt not following cues to try to actively move; unsure to what extent is baseline as pt has hx of L sided weakness from prior CVA    Cervical / Trunk Assessment Cervical / Trunk Assessment: Normal  Communication   Communication: Expressive difficulties  Cognition Arousal/Alertness: Awake/alert, Lethargic Behavior During Therapy: Flat affect Overall Cognitive Status: History of cognitive impairments - at baseline                                 General Comments: Hx of cognitive deficits. Pt flinging R arm out to push off therapist at times and kicking out R leg, actively resisting functional mobility and trying to return himself to supine often. Pt needing max coaxing and cuing to allow therapists to fix his bed pad to facilitate pt to come to partial stand before returning to supine. Pt closing eyes when asked to attempt to move L extremities.        General Comments      Exercises     Assessment/Plan    PT Assessment Patient needs continued PT services  PT Problem List Decreased  strength;Decreased activity tolerance;Decreased balance;Decreased mobility;Decreased coordination;Decreased cognition;Decreased safety awareness       PT Treatment Interventions DME instruction;Gait training;Functional mobility training;Therapeutic activities;Neuromuscular re-education;Balance training;Therapeutic exercise;Cognitive remediation;Patient/family education    PT Goals (Current goals can be found in the Care Plan section)  Acute Rehab PT Goals Patient Stated Goal: to lay down PT Goal Formulation: With patient Time For Goal Achievement: 04/24/23 Potential to Achieve Goals: Fair    Frequency Min 3X/week     Co-evaluation PT/OT/SLP  Co-Evaluation/Treatment: Yes Reason for Co-Treatment: Necessary to address cognition/behavior during functional activity;For patient/therapist safety;To address functional/ADL transfers PT goals addressed during session: Mobility/safety with mobility;Balance;Proper use of DME         AM-PAC PT "6 Clicks" Mobility  Outcome Measure Help needed turning from your back to your side while in a flat bed without using bedrails?: Total Help needed moving from lying on your back to sitting on the side of a flat bed without using bedrails?: Total Help needed moving to and from a bed to a chair (including a wheelchair)?: Total Help needed standing up from a chair using your arms (e.g., wheelchair or bedside chair)?: A Lot Help needed to walk in hospital room?: Total Help needed climbing 3-5 steps with a railing? : Total 6 Click Score: 7    End of Session   Activity Tolerance: Other (comment) (self-limiting) Patient left: in bed;with call bell/phone within reach;with bed alarm set Nurse Communication: Mobility status PT Visit Diagnosis: Unsteadiness on feet (R26.81);Other abnormalities of gait and mobility (R26.89);Muscle weakness (generalized) (M62.81);Difficulty in walking, not elsewhere classified (R26.2);Other symptoms and signs involving the nervous system (R29.898)    Time: 4098-1191 PT Time Calculation (min) (ACUTE ONLY): 18 min   Charges:   PT Evaluation $PT Eval Moderate Complexity: 1 Mod          Raymond Gurney, PT, DPT Acute Rehabilitation Services  Office: [phone]   Jewel Baize 04/10/2023, 1:07 PM

## 2023-04-10 NOTE — Hospital Course (Addendum)
[name] is a 67 y.o. male with medical history significant of benign intracranial tumor status post craniotomy 2004, complex partial seizure, schizophrenia, intellectual disability/delay, dysphagia, Barrett's esophagus, GERD, hypothyroidism, HTN, HLD, previous CVA with residual left-sided weakness.  Presented as a code stroke.  Teleneurology was consulted.  Suspected likely seizure episode and present was transferred to [facility] for further workup. LTM EEG as well as other workup is negative.  Patient back to baseline.  Awaiting transfer back to group home.

## 2023-04-10 NOTE — Telephone Encounter (Signed)
Please give him a follow-up visit with me or Maralyn Sago following his hospital discharge.

## 2023-04-10 NOTE — Progress Notes (Signed)
[facility] Progress Note Patient: [name] XBJ:[id number] DOB: July 14, 1956 DOA: 04/09/2023  DOS: the patient was seen and examined on 04/10/2023  Brief hospital course:  GAYLORD SEYDEL is a 67 y.o. male with medical history significant of benign intracranial tumor status post craniotomy 2004, complex partial seizure, schizophrenia, intellectual disability/delay, dysphagia, Barrett's esophagus, GERD, hypothyroidism, HTN, HLD, previous CVA with residual left-sided weakness.  Presented as a code stroke.  Teleneurology was consulted.  Suspected likely seizure episode and present was transferred to [facility] for further workup. Assessment and Plan: Acute confusional state most likely secondary to seizures  generalized weakness History of CVA with residual left-sided weakness Initially presented as a code stroke at [facility]. CT head unremarkable. There was some concern for seizures at the time of evaluation. CTA head and neck was negative for any large vessel occlusion. Presented to [facility]. Undergoing LTM EEG right now. On Depakote and lamotrigine. No obvious metabolic derangement. Depakote level within therapeutic range. Lamotrigine level currently pending. Neurology ordered thiamine, TSH, folic acid, B12 and RPR. UA and UDS- neg.    GERD/Barret's esophagus -PPI   Hypothyroidism -Synthroid   HTN Hold metoprolol for permissive HTN.    Intellectual disability -On Seroquel.    HLD -Crestor, checking Lipid Panel   Hx Of Benign Tumor s/p Craniotomy    Subjective: Drowsy.  No nausea or vomiting.  Able to easily awakened and able to follow commands.  No aggression seen.  Physical Exam: General: in Mild distress, No Rash Cardiovascular: S1 and S2 Present, No Murmur Respiratory: Good respiratory effort, Bilateral Air entry present. No Crackles, No wheezes Abdomen: Bowel Sound present, No tenderness Extremities: No edema Neuro: Alert  and oriented to self.  Occasionally able to follow commands.  Data Reviewed: I have Reviewed nursing notes, Vitals, and Lab results. Since last encounter, pertinent lab results CBC and BMP   . I have ordered test including CBC and BMP  .   Disposition: Status is: Observation  enoxaparin (LOVENOX) injection 40 mg Start: 04/09/23 1800   Family Communication: No one at bedside Level of care: Telemetry Medical   Vitals:   04/10/23 0300 04/10/23 0843 04/10/23 1158 04/10/23 1540  BP: 125/86 126/73 100/69 109/69  Pulse: 96 88 65 (!) 58  Resp: Temp: 98.6 F (37 C) 97.7 F (36.5 C) 97.7 F (36.5 C) 97.9 F (36.6 C)  TempSrc: Oral Oral Oral   SpO2: 100% 97% 97% 98%  Weight:      Height:         Author: Lynden Oxford, MD 04/10/2023 6:51 PM  Please look on [email or web address] to find out who is on call.

## 2023-04-10 NOTE — Procedures (Signed)
Patient Name: [name]  MRN: [id number]  Epilepsy Attending: Charlsie Quest  Referring Physician/Provider: Caryl Pina, MD  Duration: 04/09/2023 1917 to 04/10/2023 2030  Patient history: 67 year old male with a history of previous stroke as well as recurrent seizures who had an episode of decreased responsiveness with worsening of his left-sided deficits. EEG to evaluate for seizure.   Level of alertness: Awake, asleep  AEDs during EEG study: LTG, VPA  Technical aspects: This EEG study was done with scalp electrodes positioned according to the 10-20 International system of electrode placement. Electrical activity was reviewed with band pass filter of 1-70Hz , sensitivity of 7 uV/mm, display speed of 37mm/sec with a  notched filter applied as appropriate. EEG data were recorded continuously and digitally stored.  Video monitoring was available and reviewed as appropriate.  Description: The posterior dominant rhythm consists of 7 Hz activity of moderate voltage (25-35 uV) seen predominantly in posterior head regions, symmetric and reactive to eye opening and eye closing. Sleep was characterized by vertex waves, sleep spindles (12 to 14 Hz), maximal frontocentral region. EEG showed continuous 3 to 6 Hz theta-delta slowing in right frontal region. Hyperventilation and photic stimulation were not performed.      EEG was disconnected between 04/10/2023 1043 to 1155 for MRI Brain  ABNORMALITY - Continuous slow, right frontal region  IMPRESSION: This study is suggestive of cortical dysfunction arising from right frontal region likely secondary to underlying encephalomalacia. No seizures or epileptiform discharges were seen throughout the recording.  [name]

## 2023-04-10 NOTE — Progress Notes (Addendum)
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  He became nonverbal at that point.  Over the past 2 weeks, patient has had intermittent episodes of increased left-sided weakness and slurred speech which have lasted a few seconds to a few minutes.  His Depakote was recently increased from 500 mg twice daily to 750 mg twice daily  Subjective: Patient is resting in bed, will make noises in response to questions but does not answer questions or follow commands.  He swats at examiner when arm is touched.  Exam: Vitals:   04/10/23 0300 04/10/23 0843  BP: 125/86 126/73  Pulse: 96 88  Resp: 18 16  Temp: 98.6 F (37 C) 97.7 F (36.5 C)  SpO2: 100% 97%   Gen: In bed, NAD Resp: non-labored breathing, no acute distress Abd: soft, nt  Neuro: Mental Status: Patient responds to name and will make eye contact with examiner but does not answer questions or follow simple commands.  He is not cooperative with exam and swats at examiner when his arm is touched Cranial Nerves: Patient will track examiner, face appears symmetrical at rest, hearing intact to voice, noncooperative with tongue protrusion Motor: Moves right upper extremity with good antigravity strength, does not move left upper extremity, withdraws bilateral lower extremities to touch Sensory: Appears intact to light touch in right upper extremity and bilateral lower extremities Gait: Deferred  Pertinent Labs:    Latest Ref Rng & Units 04/10/2023    5:02 AM 04/09/2023    3:56 PM 04/09/2023   10:03 AM  CBC  WBC 4.0 - 10.5 K/uL 6.2  4.7  2.7   Hemoglobin 13.0 - 17.0 g/dL 16.1   09.6  04.5   Hematocrit 39.0 - 52.0 % 42.3  41.5  40.7   Platelets 150 - 400 K/uL 154  184  153        Latest Ref Rng & Units 04/10/2023    4:19 AM 04/09/2023    3:56 PM 04/09/2023   10:03 AM  BMP  Glucose 70 - 99 mg/dL 409   811   BUN 8 - 23 mg/dL 12   24   Creatinine 9.14 - 1.24 mg/dL 7.82  9.56  2.13   Sodium 135 - 145 mmol/L 137   134   Potassium 3.5 - 5.1 mmol/L 4.0   3.5   Chloride 98 - 111 mmol/L 101   101   CO2 22 - 32 mmol/L 24   25   Calcium 8.9 - 10.3 mg/dL 8.6   8.1     Depakote level 80 Lamotrigine level pending B12 pending Thiamine pending TSH pending Folate pending RPR pending  Imaging Reviewed:  CT head: No acute abnormality, unchanged encephalomalacia in right frontal lobe  CTA head and neck: No LVO or hemodynamically significant stenosis  MRI brain pending  EEG 4/17: Continuous slow right frontal region, suggestive of cortical dysfunction arising from right frontal region secondary to underlying encephalomalacia, no seizures or epileptiform discharges  Assessment: 67 year old patient with history 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  Over the past 2 weeks at patient's group home, he has had episodes of slurred speech and increased left-sided weakness.  These episodes have lasted a few seconds to a few minutes.   - CT and CTA head have showed no acute abnormality, with chronic right frontal lobe encephalomalacia noted.  - MRI brain shows no acute intracranial process. Unchanged resection cavity with surrounding encephalomalacia and gliosis is seen in the right frontal lobe. - LTM EEG (04/09/2023 1917 to 04/10/2023 0900) shows no  seizure activity.  Continuous slow in the right frontal region is most likely secondary to the focal encephalomalacia seen on CT - On exam, patient is noncooperative and swats at examiner when his arm is touched.  He is nonverbal but does make sounds in response to questions and attempts to nod or shake his head in response to questions as well.   - No obvious metabolic derangements seen on today's labs, and Depakote level is therapeutic.  Lamotrigine level pending.   - Will send AMS labs to further investigate altered mental status but suspect that a toxic metabolic encephalopathy may play a role in this.  Impression: Toxic metabolic encephalopathy versus breakthrough seizures in patient with altered mental status and recurrent episodes of slurred speech and left-sided weakness  Recommendations: 1) Altered mental status labs: Thiamine, TSH, folate, B12 and RPR 2) Continue Depakote 750 mg twice daily and lamotrigine 100 mg daily 3) Continue LTM EEG for one mare day   Cortney E Ernestina Columbia , MSN, AGACNP-BC [facility] See Amion for schedule and pager information 04/10/2023 10:20 AM  Electronically signed: Dr. Caryl Pina

## 2023-04-10 NOTE — Evaluation (Signed)
Clinical/Bedside Swallow Evaluation Patient Details  Name: [name] MRN: [id number] Date of Birth: 08/09/1956  Today's Date: 04/10/2023 Time: SLP Start Time (ACUTE ONLY): 4098 SLP Stop Time (ACUTE ONLY): 0837 SLP Time Calculation (min) (ACUTE ONLY): 11 min  Past Medical History:  Past Medical History:  Diagnosis Date   Barrett esophagus    DM II (diabetes mellitus, type II), controlled 11/13/2020   Dysphagia    Gastroparesis    GERD (gastroesophageal reflux disease)    Hypercholesteremia    Hypertension    Iron deficiency anemia    Mental retardation    Schizophrenia    Seizure    Stroke    Past Surgical History:  Past Surgical History:  Procedure Laterality Date   BIOPSY  10/21/2020   Procedure: BIOPSY;  Surgeon: Iva Boop, MD;  Location: [facility] ENDOSCOPY;  Service: Endoscopy;;   CRANIOTOMY FOR CYST FENESTRATION     ESOPHAGOGASTRODUODENOSCOPY N/A 01/09/2015   Procedure: ESOPHAGOGASTRODUODENOSCOPY (EGD);  Surgeon: Louis Meckel, MD;  Location: [facility] ENDOSCOPY;  Service: Endoscopy;  Laterality: N/A;   ESOPHAGOGASTRODUODENOSCOPY N/A 10/26/2020   Procedure: ESOPHAGOGASTRODUODENOSCOPY (EGD);  Surgeon: Corliss Skains, MD;  Location: [facility] OR;  Service: Thoracic;  Laterality: N/A;   ESOPHAGOGASTRODUODENOSCOPY (EGD) WITH PROPOFOL N/A 10/21/2020   Procedure: ESOPHAGOGASTRODUODENOSCOPY (EGD) WITH PROPOFOL;  Surgeon: Iva Boop, MD;  Location: [facility] ENDOSCOPY;  Service: Endoscopy;  Laterality: N/A;   IR GASTR TUBE CONVERT GASTR-JEJ PER W/FL MOD SED  11/07/2020   PEG PLACEMENT N/A 10/26/2020   Procedure: PERCUTANEOUS ENDOSCOPIC GASTROSTOMY (PEG) PLACEMENT;  Surgeon: Corliss Skains, MD;  Location: [facility] OR;  Service: Thoracic;  Laterality: N/A;   XI ROBOTIC ASSISTED HIATAL HERNIA REPAIR N/A 10/26/2020   Procedure: XI ROBOTIC ASSISTED HIATAL HERNIA REPAIR;  Surgeon: Corliss Skains, MD;  Location: [facility] OR;  Service: Thoracic;  Laterality: N/A;   HPI:  [name] is a 67 y.o. male with medical history significant of benign intracranial tumor status post craniotomy 2004, complex partial seizure, schizophrenia, intellectual disability/delay, dysphagia, Barrett's esophagus, GERD, hypothyroidism, HTN, HLD, previous CVA with residual left-sided weakness.   Group home has noted episodes of increasing left-sided weakness and slurred speech, decreased intake and communication. Du to his weakness and fall prior to ED arrival, code stroke was called.  CT of the head was negative. BSE 2021 rec NPO then transferred to CIR and eventually on Dys 2/thin. Per nurse pt is on a chopped diet at group home.    Assessment / Plan / Recommendation  Clinical Impression  Per nursing pt is on a chopped diet at group home. Dentures donned and pt shook head when asked to open his mouth to check for pocketing and swatted at therapist hand when tried to cue him to open his mouth. He did not follow commands for oromotor exam although no weakness noted. There were no signs of aspiration with straw sips thin, applesauce. He masticated the solid texture timely and did not appear to have residue. He appears to have to potential to be impulstive and will need full assist with meals. Will recommend Dys 2 (minced/chopped) as this is his baseline diet, thin liquids and pills whole in puree. St to follow up briefly once for safety and efficiency with recommendations. SLP Visit Diagnosis: Dysphagia, unspecified (R13.10)    Aspiration Risk  Mild aspiration risk    Diet Recommendation Thin liquid;Dysphagia 2 (Fine chop)   Liquid Administration via: Straw Medication Administration: Whole meds with liquid Supervision: Staff to assist  with self feeding;Full supervision/cueing for compensatory strategies Compensations: Minimize environmental distractions;Slow rate;Small sips/bites Postural Changes: Seated upright at 90 degrees    Other  Recommendations Oral Care Recommendations: Oral care BID     Recommendations for follow up therapy are one component of a multi-disciplinary discharge planning process, led by the attending physician.  Recommendations may be updated based on patient status, additional functional criteria and insurance authorization.  Follow up Recommendations        Assistance Recommended at Discharge    Functional Status Assessment Patient has had a recent decline in their functional status and demonstrates the ability to make significant improvements in function in a reasonable and predictable amount of time.  Frequency and Duration min 1 x/week  2 weeks       Prognosis Prognosis for improved oropharyngeal function: Good Barriers to Reach Goals: Cognitive deficits      Swallow Study   General Date of Onset: 04/09/23 HPI: [name] is a 67 y.o. male with medical history significant of benign intracranial tumor status post craniotomy 2004, complex partial seizure, schizophrenia, intellectual disability/delay, dysphagia, Barrett's esophagus, GERD, hypothyroidism, HTN, HLD, previous CVA with residual left-sided weakness.   Group home has noted episodes of increasing left-sided weakness and slurred speech, decreased intake and communication. Du to his weakness and fall prior to ED arrival, code stroke was called.  CT of the head was negative. BSE 2021 rec NPO then transferred to CIR and eventually on Dys 2/thin. Per nurse pt is on a chopped diet at group home. Type of Study: Bedside Swallow Evaluation Previous Swallow Assessment:  (see hPI) Diet Prior to this Study: NPO Temperature Spikes Noted: No Respiratory Status: Room air History of Recent Intubation: No Behavior/Cognition: Alert;Requires cueing Oral Cavity Assessment: Within Functional Limits Oral Care Completed by SLP: No Oral Cavity - Dentition: Dentures, top;Dentures, bottom Vision: Functional for self-feeding Self-Feeding Abilities: Able to feed self Patient Positioning: Upright in  bed Baseline Vocal Quality: Normal Volitional Cough: Cognitively unable to elicit Volitional Swallow: Unable to elicit    Oral/Motor/Sensory Function Overall Oral Motor/Sensory Function:  (would not cooperate for oromotor- no focal deficits)   Ice Chips Ice chips: Not tested   Thin Liquid Thin Liquid: Within functional limits Presentation: Straw    Nectar Thick Nectar Thick Liquid: Not tested   Honey Thick Honey Thick Liquid: Not tested   Puree Puree: Within functional limits   Solid     Solid: Within functional limits      Royce Macadamia 04/10/2023,8:53 AM

## 2023-04-11 ENCOUNTER — Inpatient Hospital Stay (HOSPITAL_COMMUNITY): Payer: Medicare Other

## 2023-04-11 DIAGNOSIS — R4182 Altered mental status, unspecified: Secondary | ICD-10-CM

## 2023-04-11 DIAGNOSIS — K3184 Gastroparesis: Secondary | ICD-10-CM | POA: Diagnosis present

## 2023-04-11 DIAGNOSIS — I10 Essential (primary) hypertension: Secondary | ICD-10-CM | POA: Diagnosis present

## 2023-04-11 DIAGNOSIS — G40209 Localization-related (focal) (partial) symptomatic epilepsy and epileptic syndromes with complex partial seizures, not intractable, without status epilepticus: Secondary | ICD-10-CM | POA: Diagnosis present

## 2023-04-11 DIAGNOSIS — E1143 Type 2 diabetes mellitus with diabetic autonomic (poly)neuropathy: Secondary | ICD-10-CM | POA: Diagnosis present

## 2023-04-11 DIAGNOSIS — Z931 Gastrostomy status: Secondary | ICD-10-CM | POA: Diagnosis not present

## 2023-04-11 DIAGNOSIS — I69354 Hemiplegia and hemiparesis following cerebral infarction affecting left non-dominant side: Secondary | ICD-10-CM | POA: Diagnosis not present

## 2023-04-11 DIAGNOSIS — Z79890 Hormone replacement therapy (postmenopausal): Secondary | ICD-10-CM | POA: Diagnosis not present

## 2023-04-11 DIAGNOSIS — Z888 Allergy status to other drugs, medicaments and biological substances status: Secondary | ICD-10-CM | POA: Diagnosis not present

## 2023-04-11 DIAGNOSIS — R569 Unspecified convulsions: Secondary | ICD-10-CM | POA: Diagnosis not present

## 2023-04-11 DIAGNOSIS — F05 Delirium due to known physiological condition: Secondary | ICD-10-CM | POA: Diagnosis not present

## 2023-04-11 DIAGNOSIS — E039 Hypothyroidism, unspecified: Secondary | ICD-10-CM | POA: Diagnosis present

## 2023-04-11 DIAGNOSIS — K219 Gastro-esophageal reflux disease without esophagitis: Secondary | ICD-10-CM | POA: Diagnosis present

## 2023-04-11 DIAGNOSIS — G8384 Todd's paralysis (postepileptic): Secondary | ICD-10-CM | POA: Diagnosis present

## 2023-04-11 DIAGNOSIS — E860 Dehydration: Secondary | ICD-10-CM | POA: Diagnosis present

## 2023-04-11 DIAGNOSIS — Z8249 Family history of ischemic heart disease and other diseases of the circulatory system: Secondary | ICD-10-CM | POA: Diagnosis not present

## 2023-04-11 DIAGNOSIS — Z79899 Other long term (current) drug therapy: Secondary | ICD-10-CM | POA: Diagnosis not present

## 2023-04-11 DIAGNOSIS — E7800 Pure hypercholesterolemia, unspecified: Secondary | ICD-10-CM | POA: Diagnosis present

## 2023-04-11 DIAGNOSIS — G9389 Other specified disorders of brain: Secondary | ICD-10-CM | POA: Diagnosis present

## 2023-04-11 DIAGNOSIS — E1165 Type 2 diabetes mellitus with hyperglycemia: Secondary | ICD-10-CM | POA: Diagnosis present

## 2023-04-11 DIAGNOSIS — K2270 Barrett's esophagus without dysplasia: Secondary | ICD-10-CM | POA: Diagnosis present

## 2023-04-11 DIAGNOSIS — F209 Schizophrenia, unspecified: Secondary | ICD-10-CM | POA: Diagnosis present

## 2023-04-11 DIAGNOSIS — I69398 Other sequelae of cerebral infarction: Secondary | ICD-10-CM | POA: Diagnosis not present

## 2023-04-11 DIAGNOSIS — G40919 Epilepsy, unspecified, intractable, without status epilepticus: Secondary | ICD-10-CM | POA: Diagnosis present

## 2023-04-11 DIAGNOSIS — R6250 Unspecified lack of expected normal physiological development in childhood: Secondary | ICD-10-CM | POA: Diagnosis present

## 2023-04-11 DIAGNOSIS — F79 Unspecified intellectual disabilities: Secondary | ICD-10-CM | POA: Diagnosis present

## 2023-04-11 DIAGNOSIS — R531 Weakness: Secondary | ICD-10-CM | POA: Diagnosis present

## 2023-04-11 DIAGNOSIS — G9340 Encephalopathy, unspecified: Secondary | ICD-10-CM | POA: Diagnosis present

## 2023-04-11 LAB — GLUCOSE, CAPILLARY
Glucose-Capillary: 64 mg/dL — ABNORMAL LOW (ref 70–99)
Glucose-Capillary: 95 mg/dL (ref 70–99)
Glucose-Capillary: 124 mg/dL — ABNORMAL HIGH (ref 70–99)
Glucose-Capillary: 98 mg/dL (ref 70–99)

## 2023-04-11 LAB — RPR: RPR Ser Ql: NONREACTIVE

## 2023-04-11 NOTE — Procedures (Addendum)
Patient Name: [name]  MRN: [id number]  Epilepsy Attending: Charlsie Quest  Referring Physician/Provider: Caryl Pina, MD  Duration: 04/10/2023 1917 to 04/11/2023 1032   Patient history: 67 year old male with a history of previous stroke as well as recurrent seizures who had an episode of decreased responsiveness with worsening of his left-sided deficits. EEG to evaluate for seizure.    Level of alertness: Awake, asleep   AEDs during EEG study: LTG, VPA   Technical aspects: This EEG study was done with scalp electrodes positioned according to the 10-20 International system of electrode placement. Electrical activity was reviewed with band pass filter of 1-70Hz , sensitivity of 7 uV/mm, display speed of 24mm/sec with a  notched filter applied as appropriate. EEG data were recorded continuously and digitally stored.  Video monitoring was available and reviewed as appropriate.   Description: The posterior dominant rhythm consists of 7 Hz activity of moderate voltage (25-35 uV) seen predominantly in posterior head regions, symmetric and reactive to eye opening and eye closing. Sleep was characterized by vertex waves, sleep spindles (12 to 14 Hz), maximal frontocentral region. EEG showed continuous 3 to 6 Hz theta-delta slowing in right frontal region. Hyperventilation and photic stimulation were not performed.      ABNORMALITY - Continuous slow, right frontal region   IMPRESSION: This study is suggestive of cortical dysfunction arising from right frontal region likely secondary to underlying encephalomalacia. No seizures or epileptiform discharges were seen throughout the recording.  Jessia Kief Annabelle Harman

## 2023-04-11 NOTE — Care Management Obs Status (Signed)
MEDICARE OBSERVATION STATUS NOTIFICATION   Patient Details  Name: [name] MRN: [id number] Date of Birth: 07/10/1956   Medicare Observation Status Notification Given:  Yes    Kermit Balo, RN 04/11/2023, 3:55 PM

## 2023-04-11 NOTE — Progress Notes (Signed)
LTM EEG discontinued - no skin breakdown at unhook.   

## 2023-04-11 NOTE — Progress Notes (Signed)
Physical Therapy Treatment Patient Details Name: [name] MRN: [id number] DOB: October 23, 1956 Today's Date: 04/11/2023   History of Present Illness Pt is a 67 y.o. male who presented 04/09/23 with decreased responsiveness followed by worsening of his underlying symptoms. CT of head showed no acute intracranial pathology. Pt being worked up for potential seizure. PMH: GERD, Barrett esophagus, gastroparesis, HTN, CVA (large R frontal) with L sided deficits, mental retardation, schizophrenia, hypothyroidism, seizures, benign intracranial tumor s/p craniotomy 2004, dysphagia, HLD    PT Comments    Pt continues to be self-limiting with functional mobility, but was motivated to get OOB to sit on the commode today due to needing to use the restroom. Pt was able to transition supine to sit at a minA level but required maxA to transfer to stand and take a few steps to transfer to/from the commode. Pt displays an ataxic, unsteady gait with poor L foot clearance and step length and demonstrates retropulsion in standing. Pt is a high fall risk. Will continue to follow acutely.     Recommendations for follow up therapy are one component of a multi-disciplinary discharge planning process, led by the attending physician.  Recommendations may be updated based on patient status, additional functional criteria and insurance authorization.  Follow Up Recommendations       Assistance Recommended at Discharge Frequent or constant Supervision/Assistance  Patient can return home with the following Two people to help with walking and/or transfers;A lot of help with bathing/dressing/bathroom;Assistance with cooking/housework;Direct supervision/assist for medications management;Direct supervision/assist for financial management;Assist for transportation;Help with stairs or ramp for entrance   Equipment Recommendations  BSC/3in1;Wheelchair (measurements PT);Wheelchair cushion (measurements PT);Hospital bed     Recommendations for Other Services       Precautions / Restrictions Precautions Precautions: Fall;Other (comment) Precaution Comments: can be resistive Restrictions Weight Bearing Restrictions: No     Mobility  Bed Mobility Overal bed mobility: Needs Assistance Bed Mobility: Supine to Sit, Sit to Supine     Supine to sit: HOB elevated, Min assist Sit to supine: HOB elevated, Min guard   General bed mobility comments: Pt agreeable to get OOB due to needing to use the restroom, needing minA to ascend trunk with pt reaching to pull on therapist's hand with his R, demonstrating difficulty bringing his L leg off the bed. Min guard for return to supine, needing tactile and verbal cues to find midline of bed safely    Transfers Overall transfer level: Needs assistance Equipment used: Rolling walker (2 wheels) Transfers: Sit to/from Stand, Bed to chair/wheelchair/BSC Sit to Stand: Max assist   Step pivot transfers: Max assist       General transfer comment: Pt needing several attempts to stand each rep, 1x from EOB, 2x from commode. Pt's feet would slide and he would throw himself into restropulsion upon standing, needing maxA to shift weight anteriorly and gain balance. Pt having difficulty with grabbing the RW with his L UE and often not holding it. Pt even reaching to hold onto therapist with his R UE during step pivot transfer from bed to commode. Pt with retropulsion and ataxic steps, needing maxA to step pivot bed <> commode    Ambulation/Gait Ambulation/Gait assistance: Max assist Gait Distance (Feet): 4 Feet (x2 bouts of ~3-4 ft each bout) Assistive device: Rolling walker (2 wheels) Gait Pattern/deviations: Step-to pattern, Decreased step length - left, Decreased stride length, Decreased dorsiflexion - left, Ataxic, Leaning posteriorly Gait velocity: reduced Gait velocity interpretation: <1.31 ft/sec, indicative of household ambulator  General Gait Details: Pt with very  unsteady, ataxic steps and retropulsion when taking several steps along EOB to/from bedside commode, often unable to keep his L hand on the RW, maxA for balance and direction.   Stairs             Wheelchair Mobility    Modified Rankin (Stroke Patients Only) Modified Rankin (Stroke Patients Only) Pre-Morbid Rankin Score: Moderate disability Modified Rankin: Moderately severe disability     Balance Overall balance assessment: Needs assistance Sitting-balance support: Single extremity supported, Feet supported Sitting balance-Leahy Scale: Poor Sitting balance - Comments: Reliant on R UE on bed rail, posterior lean, needing minA-min guard for static sitting balance Postural control: Posterior lean Standing balance support: Single extremity supported, Bilateral upper extremity supported Standing balance-Leahy Scale: Poor Standing balance comment: MaxA and UE support to come to stand with noted retropulsion                            Cognition Arousal/Alertness: Awake/alert Behavior During Therapy: Flat affect Overall Cognitive Status: History of cognitive impairments - at baseline                                 General Comments: Hx of cognitive deficits. Pt flinging R arm out to push off therapist at times and kicking out R leg. Pt needing max encouragement to participate with pt stating "no" repeatedly. Then pt reporting he needed to use the restroom and accepted some assistance at that point, but often limiting the amount of assistance PT could provide. Pt with poor safety awareness and needing maxA to prevent fall during session.        Exercises      General Comments        Pertinent Vitals/Pain Pain Assessment Pain Assessment: Faces Faces Pain Scale: Hurts a little bit Pain Location: pt actively withdrawing with generalized touch Pain Descriptors / Indicators: Guarding Pain Intervention(s): Limited activity within patient's tolerance,  Monitored during session    Home Living                          Prior Function            PT Goals (current goals can now be found in the care plan section) Acute Rehab PT Goals Patient Stated Goal: to use the bathroom PT Goal Formulation: With patient Time For Goal Achievement: 04/24/23 Potential to Achieve Goals: Fair Progress towards PT goals: Progressing toward goals    Frequency    Min 3X/week      PT Plan Equipment recommendations need to be updated    Co-evaluation              AM-PAC PT "6 Clicks" Mobility   Outcome Measure  Help needed turning from your back to your side while in a flat bed without using bedrails?: A Little Help needed moving from lying on your back to sitting on the side of a flat bed without using bedrails?: A Little Help needed moving to and from a bed to a chair (including a wheelchair)?: A Lot Help needed standing up from a chair using your arms (e.g., wheelchair or bedside chair)?: A Lot Help needed to walk in hospital room?: Total Help needed climbing 3-5 steps with a railing? : Total 6 Click Score: 12    End of Session Equipment Utilized  During Treatment: Gait belt Activity Tolerance: Other (comment) (self-limiting) Patient left: in bed;with call bell/phone within reach;with bed alarm set;with nursing/sitter in room Nurse Communication: Mobility status (NT) PT Visit Diagnosis: Unsteadiness on feet (R26.81);Other abnormalities of gait and mobility (R26.89);Muscle weakness (generalized) (M62.81);Difficulty in walking, not elsewhere classified (R26.2);Other symptoms and signs involving the nervous system (R29.898)     Time: 1610-9604 PT Time Calculation (min) (ACUTE ONLY): 20 min  Charges:  $Therapeutic Activity: 8-22 mins                     Raymond Gurney, PT, DPT Acute Rehabilitation Services  Office: [phone]    Jewel Baize 04/11/2023, 4:56 PM

## 2023-04-11 NOTE — Progress Notes (Signed)
Day time provider notified of pt labs not being drawn this morning, lab tried twice withy two different people and this Clinical research associate tried also. Lab has a policy that after being stuck twice by two different people they have to wait 24 hours. Passed on to day shift nurse also

## 2023-04-11 NOTE — Progress Notes (Signed)
Physical Therapy Treatment Patient Details Name: [name] MRN: [id number] DOB: 1956-06-30 Today's Date: 04/11/2023   History of Present Illness Pt is a 67 y.o. male who presented 04/09/23 with decreased responsiveness followed by worsening of his underlying symptoms. CT of head showed no acute intracranial pathology. Pt being worked up for potential seizure. 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 Per sister, pt is usually mod I for functional mobility using a RW (was about to progress to a rollator) and intermittently needs assist for dressing at the group home. Pt does have a L AFO, but wears it only intermittently, typically dragging his L foot when ambulating. In addition, he tends to have difficulty with grabbing his RW but is capable of doing so at baseline. This session, pt required maxAx2 to sit up and transfer to stand. Pt needing max encouragement from his sister to participate, which he eventually did but then actively resisted again. Pt became increasingly agitated in standing, throwing punches and elbows with his R UE and trying to head butt therapist on his R. Thus, pt was returned to bed for safety. Will update recs to inpatient therapy, <3 hours/day, to try to maximize his return to baseline. However, if sister declines this option then recommend back to group home with follow-up therapy there.     Recommendations for follow up therapy are one component of a multi-disciplinary discharge planning process, led by the attending physician.  Recommendations may be updated based on patient status, additional functional criteria and insurance  authorization.  Follow Up Recommendations  Can patient physically be transported by private vehicle: No    Assistance Recommended at Discharge Frequent or constant Supervision/Assistance  Patient can return home with the following Two people to help with walking and/or transfers;A lot of help with bathing/dressing/bathroom;Assistance with cooking/housework;Direct supervision/assist for medications management;Direct supervision/assist for financial management;Assist for transportation;Help with stairs or ramp for entrance   Equipment Recommendations  BSC/3in1;Wheelchair (measurements PT);Wheelchair cushion (measurements PT);Hospital bed    Recommendations for Other Services       Precautions / Restrictions Precautions Precautions: Fall;Other (comment) Precaution Comments: can be resistive/aggressive Restrictions Weight Bearing Restrictions: No     Mobility  Bed Mobility Overal bed mobility: Needs Assistance Bed Mobility: Supine to Sit, Sit to Supine     Supine to sit: HOB elevated, Max assist, +2 for physical assistance, +2 for safety/equipment Sit to supine: HOB elevated, Min guard   General bed mobility comments: MaxAx2 for therapist and sister to ascend his trunk with pt actively extending trunk, he did assist his sister in moving his legs off R EOB though. Pt returned self to supine with min guard for safety in regards to laying down midline in bed and avoiding hitting bed rails with descent.    Transfers Overall transfer level: Needs assistance Equipment used: Rolling walker (2 wheels) Transfers: Sit to/from Stand Sit to Stand: Max assist, +2 safety/equipment, +2 physical assistance   Step pivot transfers: Max assist       General transfer comment: Sister cuing and assisting pt to stand on his L and PT on his R. Strong retropulsion noted and pt holding onto bed rail with R UE. Provided extensive cues and assistance to try to  transfer R hand to RW instead but pt began  to throw punches and elbows with his R UE and tried to head butt therapist on his R, thus returned pt to sit for pt, family, and therapist safety    Ambulation/Gait Ambulation/Gait assistance: Max assist Gait Distance (Feet): 4 Feet (x2 bouts of ~3-4 ft each bout) Assistive device: Rolling walker (2 wheels) Gait Pattern/deviations: Step-to pattern, Decreased step length - left, Decreased stride length, Decreased dorsiflexion - left, Ataxic, Leaning posteriorly Gait velocity: reduced Gait velocity interpretation: <1.31 ft/sec, indicative of household ambulator   General Gait Details: pt declining to attempt   Stairs             Wheelchair Mobility    Modified Rankin (Stroke Patients Only) Modified Rankin (Stroke Patients Only) Pre-Morbid Rankin Score: Moderate disability Modified Rankin: Moderately severe disability     Balance Overall balance assessment: Needs assistance Sitting-balance support: Single extremity supported, Feet supported Sitting balance-Leahy Scale: Poor Sitting balance - Comments: Reliant on R UE on bed rail, posterior lean, needing modA to sit EOB Postural control: Posterior lean Standing balance support: Single extremity supported, Bilateral upper extremity supported Standing balance-Leahy Scale: Poor Standing balance comment: MaxAx2 and UE support to come to stand with noted retropulsion                            Cognition Arousal/Alertness: Awake/alert Behavior During Therapy: Flat affect, Agitated Overall Cognitive Status: History of cognitive impairments - at baseline                                 General Comments: Hx of cognitive deficits. Pt stating "no" to trying to mobilize for therapist to demonstrate how pt is functioning currently to pt's sister, who requested this demonstration to assist her in d/c planning. Pt eventually agreeable to sister. However, pt still actively resisting at times. Once standing, pt  seemed anxious to let go of bed rail and hold onto RW instead, or just was resisting further mobility. Whichever it was, pt became agitated and aggressive when trying to assist him to hold RW, throwing punches and elbows with his R UE and trying to head butt the therapist on his R, thus returned pt to sit for safety. Not following cues well at this time.        Exercises      General Comments General comments (skin integrity, edema, etc.): extended discussion held with pt's sister in regards to his hospitalization (recommending sister speak with MD to discuss potential diagnoses and medical plans of care), current functional level, fall risk, and aggressive behavior; sister unsure if pt should return to group home or to a SNF      Pertinent Vitals/Pain Pain Assessment Pain Assessment: Faces Faces Pain Scale: Hurts a little bit Pain Location: pt actively withdrawing with generalized touch, becoming aggressive at one point (seems more annoyed than in pain) Pain Descriptors / Indicators: Guarding Pain Intervention(s): Limited activity within patient's tolerance, Monitored during session    Home Living                          Prior Function            PT Goals (current goals can now be found in the care plan section) Acute Rehab PT Goals Patient Stated Goal: did not state; sister wishes  to understand what is going on with pt PT Goal Formulation: With patient/family Time For Goal Achievement: 04/24/23 Potential to Achieve Goals: Fair Progress towards PT goals: Progressing toward goals    Frequency    Min 3X/week      PT Plan Discharge plan needs to be updated    Co-evaluation              AM-PAC PT "6 Clicks" Mobility   Outcome Measure  Help needed turning from your back to your side while in a flat bed without using bedrails?: A Lot Help needed moving from lying on your back to sitting on the side of a flat bed without using bedrails?: Total Help needed  moving to and from a bed to a chair (including a wheelchair)?: Total Help needed standing up from a chair using your arms (e.g., wheelchair or bedside chair)?: Total Help needed to walk in hospital room?: Total Help needed climbing 3-5 steps with a railing? : Total 6 Click Score: 7    End of Session Equipment Utilized During Treatment: Gait belt Activity Tolerance: Other (comment);Treatment limited secondary to agitation (self-limiting) Patient left: in bed;with call bell/phone within reach;with bed alarm set;with nursing/sitter in room;with family/visitor present Nurse Communication: Mobility status PT Visit Diagnosis: Unsteadiness on feet (R26.81);Other abnormalities of gait and mobility (R26.89);Muscle weakness (generalized) (M62.81);Difficulty in walking, not elsewhere classified (R26.2);Other symptoms and signs involving the nervous system (R29.898)     Time: 1610-9604 PT Time Calculation (min) (ACUTE ONLY): 34 min  Charges:  $Therapeutic Activity: 23-37 mins                     Raymond Gurney, PT, DPT Acute Rehabilitation Services  Office: [phone]    Jewel Baize 04/11/2023, 5:09 PM

## 2023-04-11 NOTE — Progress Notes (Signed)
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  He became nonverbal at that point.  Over the past 2 weeks, patient has had intermittent episodes of increased left-sided weakness and slurred speech which have lasted a few seconds to a few minutes.  His Depakote was recently increased from 500 mg twice daily to 750 mg twice daily  Subjective: Patient is resting in bed in no acute distress. Neurologic exam improved as patient is able to answer some orientation questions for provider.  Patient does not have any further agitation on exam this morning. Overnight EEG is without evidence of seizures, LTM discontinued.  Exam: Vitals:   04/11/23 0357 04/11/23 0723  BP: 121/74 111/73  Pulse: 69 (!) 58  Resp: 15 16  Temp: (!) 97.3 F (36.3 C) (!) 97.5 F (36.4 C)  SpO2: 97% 96%   Gen: Laying in bed sleeping, in no acute distress Resp: non-labored breathing, no respiratory distress on room air Abd: soft, nt  Neuro: Mental Status: Patient is initially asleep, wakes to voice.  Patient is able to state his name.  Incorrectly states that he is 67 years old.  Further orientation questions are answered with "I do not know"  He follows simple commands without difficulty.  Patient is unable to follow multistep commands. Speech is mumbled, at times difficult to understand. Cranial Nerves: Patient will track examiner, there appears to be some left facial asymmetry, hearing intact to voice, patient reports left facial decreased sensation to light touch. Motor: Moves right upper extremity with good antigravity strength, minimal left upper extremity  antigravity movement.  Right lower extremity elevates antigravity without vertical drift, left lower extremity is able to move without gravity. Sensory: Decreased sensation reported to the left upper and lower extremity to light touch Gait: Deferred  Pertinent Labs:    Latest Ref Rng & Units 04/10/2023    5:02 AM 04/09/2023    3:56 PM 04/09/2023   10:03 AM  CBC  WBC 4.0 - 10.5 K/uL 6.2  4.7  2.7   Hemoglobin 13.0 - 17.0 g/dL 16.1  09.6  04.5   Hematocrit 39.0 - 52.0 % 42.3  41.5  40.7   Platelets 150 - 400 K/uL 154  184  153        Latest Ref Rng & Units 04/10/2023    4:19 AM 04/09/2023    3:56 PM 04/09/2023   10:03 AM  BMP  Glucose 70 - 99 mg/dL 409   811   BUN 8 - 23 mg/dL 12   24   Creatinine 9.14 - 1.24 mg/dL 7.82  9.56  2.13   Sodium 135 - 145 mmol/L 137   134   Potassium 3.5 - 5.1 mmol/L 4.0   3.5   Chloride 98 - 111 mmol/L 101   101   CO2 22 - 32 mmol/L 24   25   Calcium 8.9 - 10.3 mg/dL 8.6   8.1     Lab Results  Component Value Date   VITAMINB12 588 04/10/2023   Lab Results  Component Value Date   TSH 2.742 04/10/2023   Depakote level 80 Lamotrigine level 10 Folate 11.3 RPR Non reactive Thiamine pending  Imaging Reviewed:  CT head: No acute  abnormality, unchanged encephalomalacia in right frontal lobe  CTA head and neck: No LVO or hemodynamically significant stenosis  MRI brain: No acute intracranial process, unchanged resection cavity with surrounding encephalomalacia in gliosis in the right frontal lobe.  EEG 4/17: Continuous slow right frontal region, suggestive of cortical dysfunction arising from right frontal region secondary to underlying encephalomalacia, no seizures or epileptiform discharges  EEG 4/17 - 4/18: "This study is suggestive of cortical dysfunction arising from right frontal region likely secondary to underlying encephalomalacia. 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  Over the past 2 weeks at patient's group home, he has had episodes of slurred speech and increased left-sided weakness.  These episodes have lasted a few seconds to a few minutes.   - CT and CTA head have showed no acute abnormality, with chronic right frontal lobe encephalomalacia noted.  - MRI brain shows no acute intracranial process. Unchanged resection cavity with surrounding encephalomalacia and gliosis is seen in the right frontal lobe. - LTM EEG (04/09/2023 1917 to 04/10/2023 0900) shows no seizure activity.  Continuous slow in the right frontal region is most likely secondary to the focal encephalomalacia seen on CT - LTM EEG 04/10/2023 to 04/11/2023: This study is suggestive of cortical dysfunction arising from right frontal region likely secondary to underlying encephalomalacia. No seizures or epileptiform discharges were seen throughout the recording. - On exam, patient's neurologic status has improved, patient follows commands and responds verbally to examiner without further agitation. Patient is at his baseline mental status.  - No obvious metabolic derangements seen on today's labs, and Depakote and Lamotrigine levels are therapeutic.   - AMS labs have been unremarkable thus far, Thiamine level pending. With improvement in mental status, suspect that a toxic metabolic encephalopathy may have played a role in his presentation versus less likely seizure with negative EEG and therapeutic AED levels.   Impression: Toxic metabolic encephalopathy versus breakthrough seizures in patient with altered mental status and  recurrent episodes of slurred speech and left-sided weakness  Recommendations: 1) Altered mental status labs: Thiamine pending 2) Continue Depakote 750 mg twice daily and lamotrigine 100 mg daily 3) Discontinue LTM EEG monitoring  4) No further inpatient neurology work up at this time as patient has returned to his baseline neurologic status.  5) Neurohospitalist service will sign off. Please call if there are additional questions.   Kara Mead , MSN, AGACNP-BC [facility] See Amion for schedule and pager information 04/11/2023 9:25 AM  Electronically signed: Dr. Caryl Pina

## 2023-04-11 NOTE — Progress Notes (Signed)
Speech Language Pathology Treatment: Dysphagia  Patient Details Name: GERRELL TABET MRN: [id number] DOB: 1956/09/04 Today's Date: 04/11/2023 Time: 4098-1191 SLP Time Calculation (min) (ACUTE ONLY): 21 min  Assessment / Plan / Recommendation Clinical Impression  Pt seen for dysphagia and needed total assist for feeding breakfast. He will only intermittently follow commands and tried to swat therapists hand away at times. His speech is mostly incomprehensible with therapist understanding "oatmeal" and most shakes his head. He demonstrated oral holding and pocketing with Dys 2 texture. Liquid wash and sips orange juice helped to clear. He would not open mouth on command but therapist could see in oral cavity when providing bites of oatmeal. He was able to manipulate and transit oatmeal and applesauce timely without pocketing. He appears sleepier this morning and given waxing and waning mental status, history of developmental delay and strokes, therapist will downgrade to puree (Dys 1), continue thin, check oral cavity for pocketing and crush meds. ST will follow.   HPI HPI: LADONTE VERSTRAETE is a 67 y.o. male with medical history significant of benign intracranial tumor status post craniotomy 2004, complex partial seizure, schizophrenia, intellectual disability/delay, dysphagia, Barrett's esophagus, GERD, hypothyroidism, HTN, HLD, previous CVA with residual left-sided weakness.   Group home has noted episodes of increasing left-sided weakness and slurred speech, decreased intake and communication. Du to his weakness and fall prior to ED arrival, code stroke was called.  CT of the head was negative. BSE 2021 rec NPO then transferred to CIR and eventually on Dys 2/thin. Per nurse pt is on a chopped diet at group home.      SLP Plan  Continue with current plan of care      Recommendations for follow up therapy are one component of a multi-disciplinary discharge planning process, led by the  attending physician.  Recommendations may be updated based on patient status, additional functional criteria and insurance authorization.    Recommendations  Diet recommendations: Dysphagia 1 (puree);Thin liquid Liquids provided via: Straw Medication Administration: Crushed with puree Supervision: Full supervision/cueing for compensatory strategies Compensations: Minimize environmental distractions;Slow rate;Small sips/bites Postural Changes and/or Swallow Maneuvers: Seated upright 90 degrees                  Oral care BID   Frequent or constant Supervision/Assistance Dysphagia, unspecified (R13.10)     Continue with current plan of care     Royce Macadamia  04/11/2023, 10:17 AM

## 2023-04-11 NOTE — Progress Notes (Addendum)
[facility] Progress Note Patient: [name] ZOX:[id number] DOB: [birth year] DOA: 04/09/2023  DOS: the patient was seen and examined on 04/11/2023  Brief hospital course:  [name] is a 67 y.o. male with medical history significant of benign intracranial tumor status post craniotomy 2004, complex partial seizure, schizophrenia, intellectual disability/delay, dysphagia, Barrett's esophagus, GERD, hypothyroidism, HTN, HLD, previous CVA with residual left-sided weakness.  Presented as a code stroke.  Teleneurology was consulted.  Suspected likely seizure episode and present was transferred to [facility] for further workup. Assessment and Plan: Acute confusional state most likely secondary to seizures  generalized weakness History of CVA with residual left-sided weakness Initially presented as a code stroke at [facility]. CT head unremarkable. There was some concern for seizures at the time of evaluation. CTA head and neck was negative for any large vessel occlusion. Transferred to [facility] for LTM EEG monitoring. On Depakote and lamotrigine. No obvious metabolic derangement. Depakote level within therapeutic range. Lamotrigine level currently pending. TSH, folic acid, B12 and RPR also reassuringly normal. UA and UDS- neg.  Neurology was consulted. LTM EEG did not show any evidence of acute seizures. Mentation improved and therefore neurology signed off on 4/18.  Recommending no changes so far in his medication.   GERD/Barret's esophagus -PPI   Hypothyroidism -Synthroid 175 mcg.   HTN Blood pressure stable without any medication right now. On benazepril, metoprolol at home.   Intellectual disability -On Seroquel.    HLD -Crestor, checking Lipid Panel   Hx Of Benign Tumor s/p Craniotomy   Addendum: Discussed with sister at bedside.  She reports that cognitively patient appears to be close to his baseline but appears to be more  drowsy. Reportedly has sustained some injury at the facility on his left lower back.  Since then he has been avoiding in bed to come close to him.  Also appears that he is walking trying to protect his left side from any pressure. Depakote dose was increased at the group home by the MD for this from 100 mg percent 50 mg twice daily. Depakote level was either 45 or 54 before the change. Patient did not have any significant pain or tenderness at the time of my examination of the left hip area. X-ray lumbar spine as well as x-ray hip ordered to ensure no acute abnormality. Sister wants to consider placement at a rehab.  [name] 7:08 PM 04/11/2023     Subjective: Alert.  Able to follow commands.  Denies any acute complaint.  No events overnight.  Physical Exam: Clear to auscultation. S1-S2 present. Trace edema.  Occasionally following commands.  No new focal deficit seen.  Data Reviewed: I have Reviewed nursing notes, Vitals, and Lab results. Unable to perform lab due to difficult stick.  Disposition: Status is: Inpatient  enoxaparin (LOVENOX) injection 40 mg Start: 04/09/23 1800   Family Communication: Will discuss with sister at bedside. Level of care: Telemetry Medical   Vitals:   04/11/23 0357 04/11/23 0723 04/11/23 1102 04/11/23 1509  BP: 121/74 111/73 113/80 (!) 142/86  Pulse: 69 (!) 58 87 (!) 103  Resp: Temp: (!) 97.3 F (36.3 C) (!) 97.5 F (36.4 C) 97.8 F (36.6 C) (!) 97.4 F (36.3 C)  TempSrc: Axillary Oral Oral Oral  SpO2: 97% 96% 94% 98%  Weight:      Height:         Author: Lynden Oxford, MD 04/11/2023 4:44 PM  Please look  on [email or web address] to find out who is on call.

## 2023-04-11 NOTE — TOC Initial Note (Signed)
Transition of Care [facility]) - Initial/Assessment Note    Patient Details  Name: [name] MRN: [id number] Date of Birth: 1956/04/26  Transition [facility] Care [facility]) CM/SW Contact:    Kermit Balo, RN Phone Number: 04/11/2023, 3:23 PM  Clinical Narrative:                 Pt is from [facility] group home. CM spoke to [place] at [facility]: [phone]. She states the patient was having issues with mobility prior to admission. She says they have ordered him a AFO brace and a platform walker. CM inquired is they can accommodate him at Wheelchair level until platform walker arrives. She voiced they could provide this support. CM then spoke to pts sister. She prefers to decide herself if she feels he is safe to return to [facility] vs rehab. CM has updated bedside RN to have him move around some when sister arrives.  [facility] provides his needed transportation and medications.  TOC following for d/c disposition.   Expected Discharge Plan: OP Rehab Barriers to Discharge: Continued Medical Work up   Patient Goals and CMS Choice   CMS Medicare.gov Compare Post Acute Care list provided to:: Other (Comment Required) (Group home)        Expected Discharge Plan and Services   Discharge Planning Services: CM Consult   Living arrangements for the past 2 months: Group Home                                      Prior Living Arrangements/Services Living arrangements for the past 2 months: Group Home Lives with:: Facility Resident Patient language and need for interpreter reviewed:: Yes Do you feel safe going back to the place where you live?: Yes        Care giver support system in place?: Yes (comment)   Criminal Activity/Legal Involvement Pertinent to Current Situation/Hospitalization: No - Comment as needed  Activities of Daily Living Home Assistive Devices/Equipment: Walker (specify type) ADL Screening (condition at time of admission) Patient's cognitive ability adequate to safely complete  daily activities?: Yes Is the patient deaf or have difficulty hearing?: No Does the patient have difficulty seeing, even when wearing glasses/contacts?: No Does the patient have difficulty concentrating, remembering, or making decisions?: No Patient able to express need for assistance with ADLs?: Yes Does the patient have difficulty dressing or bathing?: Yes Independently performs ADLs?: No Communication: Independent Dressing (OT): Needs assistance Is this a change from baseline?: Pre-admission baseline Grooming: Needs assistance Is this a change from baseline?: Pre-admission baseline Feeding: Needs assistance Is this a change from baseline?: Pre-admission baseline Bathing: Needs assistance Is this a change from baseline?: Pre-admission baseline Toileting: Needs assistance Is this a change from baseline?: Pre-admission baseline In/Out Bed: Needs assistance Is this a change from baseline?: Pre-admission baseline Walks in Home: Needs assistance Is this a change from baseline?: Pre-admission baseline Does the patient have difficulty walking or climbing stairs?: No Weakness of Legs: None Weakness of Arms/Hands: None  Permission Sought/Granted                  Emotional Assessment           Psych Involvement: No (comment)  Admission diagnosis:  General weakness [R53.1] Left-sided weakness [R53.1] Altered mental status, unspecified altered mental status type [R41.82] Patient Active Problem List   Diagnosis Date Noted   General weakness 04/09/2023   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 10/26/2021   Seizure disorder 10/26/2021   S/P craniotomy 03/09/2021   Dilation of esophagus    Hyponatremia    Dysphagia    Seizures    S/P percutaneous endoscopic gastrostomy (PEG) tube placement    Weakness 11/15/2020   DM II (diabetes mellitus, type II), controlled 11/13/2020   Bowel obstruction    S/P repair of paraesophageal  hernia 10/26/2020   Syncope 10/21/2020   Hiatal hernia 10/21/2020   Acute hypoxemic respiratory failure 10/21/2020   Sinus tachycardia 10/21/2020   Gastric polyps    Altered mental status 09/05/2020   Emesis 07/24/2015   Coffee ground emesis    Gastrointestinal hemorrhage associated with other gastritis    Acute esophagitis 01/09/2015   Upper gastrointestinal bleed    Diarrhea    Nausea with vomiting    GI bleed 01/08/2015   GIB (gastrointestinal bleeding) 01/08/2015   Nausea & vomiting 01/08/2015   Reflux esophagitis 07/04/2009   Loss of weight 07/04/2009   BLOOD IN STOOL, OCCULT 07/04/2009   ANEMIA, IRON DEFICIENCY 05/04/2008   Hypothyroidism 04/05/2008   Schizophrenia, unspecified type 04/05/2008   Depression 04/05/2008   Hemiplegia (HCC) 04/05/2008   Essential hypertension 04/05/2008   GERD 04/05/2008   Gastroparesis 04/05/2008   Seizure 04/05/2008   History of diverticulosis 08/21/2004   Barrett's esophagus 04/01/2002   HIATAL HERNIA 04/01/2002   PCP:  [facility]:   [phone] - [place], [place] - [address] AT [address] OF [facility] [address] [place] [address] Phone: [phone] Fax: [phone]     Social Determinants of Health (SDOH) Social History: SDOH Screenings   Food Insecurity: No Food Insecurity (04/09/2023)  Housing: Low Risk  (04/09/2023)  Transportation Needs: No Transportation Needs (04/09/2023)  Utilities: Not At Risk (04/09/2023)  Depression (PHQ2-9): Low Risk  (07/10/2022)  Tobacco Use: Low Risk  (04/09/2023)   SDOH Interventions:     Readmission Risk Interventions     No data to display

## 2023-04-12 DIAGNOSIS — R531 Weakness: Secondary | ICD-10-CM | POA: Diagnosis not present

## 2023-04-12 LAB — CBC
HCT: 40.2 % (ref 39.0–52.0)
Hemoglobin: 14.3 g/dL (ref 13.0–17.0)
MCH: 32.2 pg (ref 26.0–34.0)
MCHC: 35.6 g/dL (ref 30.0–36.0)
MCV: 90.5 fL (ref 80.0–100.0)
Platelets: 162 10*3/uL (ref 150–400)
RBC: 4.44 MIL/uL (ref 4.22–5.81)
RDW: 11.5 % (ref 11.5–15.5)
WBC: 4.2 10*3/uL (ref 4.0–10.5)
nRBC: 0 % (ref 0.0–0.2)

## 2023-04-12 LAB — GLUCOSE, CAPILLARY
Glucose-Capillary: 105 mg/dL — ABNORMAL HIGH (ref 70–99)
Glucose-Capillary: 120 mg/dL — ABNORMAL HIGH (ref 70–99)
Glucose-Capillary: 145 mg/dL — ABNORMAL HIGH (ref 70–99)
Glucose-Capillary: 92 mg/dL (ref 70–99)
Glucose-Capillary: 96 mg/dL (ref 70–99)

## 2023-04-12 LAB — BASIC METABOLIC PANEL
Anion gap: 9 (ref 5–15)
BUN: 14 mg/dL (ref 8–23)
CO2: 27 mmol/L (ref 22–32)
Calcium: 8.6 mg/dL — ABNORMAL LOW (ref 8.9–10.3)
Chloride: 102 mmol/L (ref 98–111)
Creatinine, Ser: 1.15 mg/dL (ref 0.61–1.24)
GFR, Estimated: >60 mL/min (ref >60)
Glucose, Bld: 102 mg/dL — ABNORMAL HIGH (ref 70–99)
Potassium: 4.3 mmol/L (ref 3.5–5.1)
Sodium: 138 mmol/L (ref 135–145)

## 2023-04-12 MED ORDER — LIDOCAINE 5 % EX PTCH: 1.0000 | MEDICATED_PATCH | Freq: Every day | CUTANEOUS | 0 refills | Status: DC

## 2023-04-12 MED ORDER — LIDOCAINE 5 % EX PTCH
1.0000 | MEDICATED_PATCH | Freq: Every day | CUTANEOUS | Status: DC
  Administered 2023-04-12 – 2023-04-15 (×4): 1 via TRANSDERMAL
  Filled 2023-04-12 (×2): qty 1

## 2023-04-12 MED ORDER — ENSURE ENLIVE PO LIQD
237.0000 mL | Freq: Three times a day (TID) | ORAL | Status: DC
  Administered 2023-04-12 – 2023-04-15 (×10): 237 mL via ORAL

## 2023-04-12 MED ORDER — DIVALPROEX SODIUM 250 MG PO DR TAB: 250.0000 mg | DELAYED_RELEASE_TABLET | Freq: Two times a day (BID) | ORAL | 0 refills | Status: DC

## 2023-04-12 MED ORDER — DIVALPROEX SODIUM 500 MG PO DR TAB: 500.0000 mg | DELAYED_RELEASE_TABLET | Freq: Two times a day (BID) | ORAL | 11 refills | Status: DC

## 2023-04-12 MED ORDER — ENSURE ENLIVE PO LIQD: 237.0000 mL | Freq: Three times a day (TID) | ORAL | 0 refills | Status: DC

## 2023-04-12 NOTE — Plan of Care (Signed)

## 2023-04-12 NOTE — Progress Notes (Signed)
Patient suffers from weakness which impairs their ability to perform daily activities like bathing and grooming in the home.  A walker will not resolve  ?issue with performing activities of daily living. A wheelchair will allow patient to safely perform daily activities. Patient is not able to propel themselves in the home using a standard weight wheelchair due to general weakness. Patient can self propel in the lightweight wheelchair. Length of need Lifetime.  ?Accessories: elevating leg rests (ELRs), wheel locks, extensions and anti-tippers. ?

## 2023-04-12 NOTE — Discharge Summary (Addendum)
Physician Discharge Summary   Patient: [name] MRN: [id number] DOB: 02-Dec-1956  Admit date:     04/09/2023  Discharge date: 04/12/23  Discharge Physician: Lynden Oxford  PCP: Lucretia Field  Recommendations at discharge:  Follow up with PCP in 1 week and Neurology as recommended   Follow-up Information     [facility]. Schedule an appointment as soon as possible for a visit in 1 week(s).   Specialty: Rehabilitation Contact information: [address] [place], [address] [phone] mc [place] [address] [phone]        Royals, Gretta Began. Schedule an appointment as soon as possible for a visit in 1 week(s).   Specialty: Family Medicine Contact information: [address] [place] [address] [phone]         Levert Feinstein, MD. Schedule an appointment as soon as possible for a visit in 2 week(s).   Specialty: Neurology Contact information: [address] [place] [address] [phone]                Discharge Diagnoses: Principal Problem:   Weakness Active Problems:   Hypothyroidism   Schizophrenia, unspecified type   Hemiplegia (HCC)   GERD   S/P repair of paraesophageal hernia   DM II (diabetes mellitus, type II), controlled   S/P percutaneous endoscopic gastrostomy (PEG) tube placement   S/P craniotomy   General weakness   Breakthrough seizure  Hospital Course:  [name] is a 67 y.o. male with medical history significant of benign intracranial tumor status post craniotomy 2004, complex partial seizure, schizophrenia, intellectual disability/delay, dysphagia, Barrett's esophagus, GERD, hypothyroidism, HTN, HLD, previous CVA with residual left-sided weakness.  Presented as a code stroke.  Teleneurology was consulted.  Suspected likely seizure episode and present was transferred to [facility] for further workup. Assessment and Plan  Acute confusional  state most likely secondary to seizures  generalized weakness History of CVA with residual left-sided weakness Initially presented as a code stroke at [facility]. CT head unremarkable. There was some concern for seizures at the time of evaluation. CTA head and neck was negative for any large vessel occlusion. Transferred to [facility] for LTM EEG monitoring. On Depakote and lamotrigine. Dose was recently increased at the facility from 500 mg bid to 750 mg bid. No obvious metabolic derangement. Depakote level within therapeutic range. Lamotrigine level also therapeutic TSH, folic acid, B12 and RPR also reassuringly normal. UA and UDS- neg.  Neurology was consulted. LTM EEG did not show any evidence of acute seizures. Mentation improved and therefore neurology signed off on 4/18.  Recommending no changes so far in his medication. Follow up with primary neuro in 2 weeks.    GERD/Barret's esophagus PPI   Hypothyroidism Synthroid 175 mcg.   HTN Blood pressure stable without any medication right now. On benazepril, metoprolol at home. Will stop benazepril and resume metoprolol.    Intellectual disability -On Seroquel.    HLD -Crestor   Hx Of Benign Tumor s/p Craniotomy    Left lower back injury  Reportedly has sustained some injury at the facility on his left lower back.  Since then appears that he is walking trying to protect his left side from any pressure. Patient did not have any significant pain or tenderness at the time of my examination of the left hip area. X-ray lumbar spine as well as x-ray hip negative for any fracture.  Lidocaine patch and tylenol for pain  control.   Dysphagia  Reportedly on pureed diet since his surgery  Speech consulted.  Ok for D2 diet.   Consultants:  Neurology   Procedures performed:  LTM EEG  DISCHARGE MEDICATION: Allergies as of 04/12/2023       Reactions   Vancomycin Other (See Comments), Itching   Unknown-possible  hives Unknown-possible hives, Hives.        Medication List     STOP taking these medications    benazepril 5 MG tablet Commonly known as: LOTENSIN       TAKE these medications    CORN HUSKERS EX Apply 1 application topically in the morning and at bedtime. For dry skin   divalproex 250 MG DR tablet Commonly known as: DEPAKOTE Take 1 tablet (250 mg total) by mouth 2 (two) times daily. With 500 mg for a total of 750 mg BID What changed: additional instructions   divalproex 500 MG DR tablet Commonly known as: DEPAKOTE Take 1 tablet (500 mg total) by mouth every 12 (twelve) hours. Along with 250 mg for a total of 750 mg BID What changed: additional instructions   docusate sodium 100 MG capsule Commonly known as: COLACE Take 1 capsule (100 mg total) by mouth 2 (two) times daily.   feeding supplement Liqd Take 237 mLs by mouth 3 (three) times daily between meals.   lamoTRIgine 100 MG tablet Commonly known as: LAMICTAL Take 1 tablet (100 mg total) by mouth at bedtime.   levothyroxine 175 MCG tablet Commonly known as: SYNTHROID Take 175 mcg by mouth daily. What changed: Another medication with the same name was removed. Continue taking this medication, and follow the directions you see here.   lidocaine 5 % Commonly known as: LIDODERM Place 1 patch onto the skin daily. Remove & Discard patch within 12 hours or as directed by MD   metoprolol tartrate 25 MG tablet Commonly known as: LOPRESSOR Take 1 tablet (25 mg total) by mouth 2 (two) times daily.   ondansetron 4 MG tablet Commonly known as: ZOFRAN Take 4 mg by mouth 4 (four) times daily as needed for nausea or vomiting.   pantoprazole 40 MG tablet Commonly known as: PROTONIX Take 1 tablet (40 mg total) by mouth 2 (two) times daily before a meal.   polyethylene glycol 17 g packet Commonly known as: MIRALAX / GLYCOLAX Take 17 g by mouth 2 (two) times daily.   QUEtiapine 400 MG tablet Commonly known as:  SEROQUEL Take 1 tablet (400 mg total) by mouth 2 (two) times daily.   rosuvastatin 20 MG tablet Commonly known as: CRESTOR Take 1 tablet (20 mg total) by mouth daily.   Vitamin D 50 MCG (2000 UT) tablet Take 1 tablet (2,000 Units total) by mouth daily.               Durable Medical Equipment  (From admission, onward)           Start     Ordered   04/12/23 1009  For home use only DME lightweight manual wheelchair with seat cushion  Once       Comments: Patient suffers from weakness which impairs their ability to perform daily activities like bathing and grooming in the home.  A walker will not resolve  issue with performing activities of daily living. A wheelchair will allow patient to safely perform daily activities. Patient is not able to propel themselves in the home using a standard weight wheelchair due to general weakness. Patient can self propel in  the lightweight wheelchair. Length of need Lifetime. Accessories: elevating leg rests (ELRs), wheel locks, extensions and anti-tippers.   04/12/23 1009           Disposition: Home Diet recommendation: Dysphagia type 2 thin Liquid  Discharge Exam: Vitals:   04/11/23 2341 04/12/23 0434 04/12/23 0709 04/12/23 1112  BP: 118/66 105/79 111/71 133/87  Pulse: 99 68 (!) 59 86  Resp: 14 14 16 18   Temp: 98.1 F (36.7 C) 97.6 F (36.4 C) 97.7 F (36.5 C) 97.7 F (36.5 C)  TempSrc: Oral Oral Oral Oral  SpO2: 94% 94% 98% 95%  Weight:      Height:       General: Appear in no distress; no visible Abnormal Neck Mass Or lumps, Conjunctiva normal Cardiovascular: S1 and S2 Present, no Murmur, Respiratory: good respiratory effort, Bilateral Air entry present and CTA, no Crackles, no wheezes Abdomen: Bowel Sound present, Non tender  Extremities: no Pedal edema Neurology: alert and oriented to self  [facility] Weights   04/09/23 0955  Weight: 65 kg   Condition at discharge: stable  The results of significant diagnostics from  this hospitalization (including imaging, microbiology, ancillary and laboratory) are listed below for reference.   Imaging Studies: DG HIP UNILAT WITH PELVIS 2-3 VIEWS LEFT  Result Date: 04/11/2023 CLINICAL DATA:  Trauma EXAM: DG HIP (WITH OR WITHOUT PELVIS) 2-3V LEFT COMPARISON:  Radiographs 05/23/2020 FINDINGS: Stable appearance of fragmented osteophyte along the acetabulum, no change from 05/05/2020. No visible fracture or acute bony findings. IMPRESSION: 1. No acute findings. 2. Stable fragmented osteophyte along the acetabulum. Electronically Signed   By: Gaylyn Rong M.D.   On: 04/11/2023 19:58   DG Lumbar Spine 2-3 Views  Result Date: 04/11/2023 CLINICAL DATA:  Trauma. EXAM: LUMBAR SPINE - 2-3 VIEW COMPARISON:  Lumbar spine radiograph dated 05/05/2020. FINDINGS: Five lumbar type vertebra. There is no acute fracture or subluxation of the lumbar spine. Mild multilevel chronic compression deformity. The visualized posterior elements are intact. The soft tissues are unremarkable. IMPRESSION: No acute/traumatic lumbar spine pathology. Electronically Signed   By: Elgie Collard M.D.   On: 04/11/2023 19:43   MR BRAIN WO CONTRAST  Result Date: 04/10/2023 CLINICAL DATA:  TIA. History of right frontal lobe stroke post ischemic seizure disorder. Benign intracranial tumor status post resection in 2004. EXAM: MRI HEAD WITHOUT CONTRAST TECHNIQUE: Multiplanar, multiecho pulse sequences of the brain and surrounding structures were obtained without intravenous contrast. COMPARISON:  MRI brain 09/15/2021. FINDINGS: Brain: No acute infarct or hemorrhage. Unchanged resection cavity with surrounding encephalomalacia and gliosis in the right frontal lobe. No hydrocephalus or extra-axial collection. No mass effect or midline shift. Vascular: Normal flow voids. Skull and upper cervical spine: Prior right frontoparietal craniotomy. Sinuses/Orbits: Unremarkable. Other: None. IMPRESSION: 1. No acute intracranial  process. 2. Unchanged resection cavity with surrounding encephalomalacia and gliosis in the right frontal lobe. Electronically Signed   By: Orvan Falconer M.D.   On: 04/10/2023 11:43   Overnight EEG with video  Result Date: 04/10/2023 Charlsie Quest, MD     04/11/2023  9:15 AM Patient Name: CAP MASSI MRN: [id number] Epilepsy Attending: Charlsie Quest Referring Physician/Provider: Caryl Pina, MD Duration: 04/09/2023 1917 to 04/10/2023 2030 Patient history: 67 year old male with a history of previous stroke as well as recurrent seizures who had an episode of decreased responsiveness with worsening of his left-sided deficits. EEG to evaluate for seizure. Level of alertness: Awake, asleep AEDs during EEG study: LTG, VPA Technical aspects: This EEG  study was done with scalp electrodes positioned according to the 10-20 International system of electrode placement. Electrical activity was reviewed with band pass filter of 1-70Hz , sensitivity of 7 uV/mm, display speed of 64mm/sec with a 60Hz  notched filter applied as appropriate. EEG data were recorded continuously and digitally stored.  Video monitoring was available and reviewed as appropriate. Description: The posterior dominant rhythm consists of 7 Hz activity of moderate voltage (25-35 uV) seen predominantly in posterior head regions, symmetric and reactive to eye opening and eye closing. Sleep was characterized by vertex waves, sleep spindles (12 to 14 Hz), maximal frontocentral region. EEG showed continuous 3 to 6 Hz theta-delta slowing in right frontal region. Hyperventilation and photic stimulation were not performed.   EEG was disconnected between 04/10/2023 1043 to 1155 for MRI Brain ABNORMALITY - Continuous slow, right frontal region IMPRESSION: This study is suggestive of cortical dysfunction arising from right frontal region likely secondary to underlying encephalomalacia. No seizures or epileptiform discharges were seen throughout the  recording. Priyanka Annabelle Harman   CT ANGIO HEAD NECK W WO CM (CODE STROKE)  Result Date: 04/09/2023 CLINICAL DATA:  Left-sided weakness. EXAM: CT ANGIOGRAPHY HEAD AND NECK WITH AND WITHOUT CONTRAST TECHNIQUE: Multidetector CT imaging of the head and neck was performed using the standard protocol during bolus administration of intravenous contrast. Multiplanar CT image reconstructions and MIPs were obtained to evaluate the vascular anatomy. Carotid stenosis measurements (when applicable) are obtained utilizing NASCET criteria, using the distal internal carotid diameter as the denominator. RADIATION DOSE REDUCTION: This exam was performed according to the departmental dose-optimization program which includes automated exposure control, adjustment of the mA and/or kV according to patient size and/or use of iterative reconstruction technique. CONTRAST:  75mL OMNIPAQUE IOHEXOL 350 MG/ML SOLN COMPARISON:  None Available. FINDINGS: CTA NECK FINDINGS Aortic arch: The imaged aortic arch is normal. The origins of the major branch vessels are patent. The subclavian arteries are patent to the level imaged. Right carotid system: The right common, internal, and external carotid arteries are patent, without hemodynamically significant stenosis or occlusion. There is no evidence of dissection or aneurysm. Left carotid system: The left common, internal, and external carotid arteries are patent, without hemodynamically significant stenosis or occlusion. There is no evidence of dissection or aneurysm. Vertebral arteries: The vertebral arteries are patent, without hemodynamically significant stenosis or occlusion there is no evidence of dissection or aneurysm. Skeleton: There is no acute osseous abnormality or suspicious osseous lesion. There is no visible canal hematoma. Other neck: The soft tissues of the neck are unremarkable. The esophagus is markedly patulous, as seen on prior CT chest from 2021. There is debris in the upper  trachea. Upper chest: The imaged lung apices are clear. Review of the MIP images confirms the above findings CTA HEAD FINDINGS Image quality is degraded by motion artifact at the skull base. Anterior circulation: The intracranial ICAs are patent without definite hemodynamically significant stenosis. The left MCAs are patent, without proximal high-grade stenosis or occlusion. There is diminished vascularity in the area of encephalomalacia in the right frontal lobe. The bilateral ACAs are patent, without proximal high-grade stenosis or occlusion. There is no aneurysm or AVM. Posterior circulation: The bilateral V4 segments are patent. The basilar artery is patent but diminutive in caliber. The major cerebellar arteries appear patent. The bilateral PCAs are patent, without proximal high-grade stenosis or occlusion. The PCAs are supplied by prominent posterior communicating arteries bilaterally with diminutive P1 segments (fetal origins). There is no aneurysm or AVM. Venous  sinuses: As permitted by contrast timing, patent. Anatomic variants: As above. Review of the MIP images confirms the above findings IMPRESSION: 1. Patent vasculature of the head and neck with no hemodynamically significant stenosis, occlusion, or dissection. 2. Patulous esophagus with debris in the upper trachea. Correlate with any signs or symptoms of aspiration. These results were called by telephone at the time of interpretation on 04/09/2023 at 1:44 pm to provider Dr. Amada Jupiter, who verbally acknowledged these results. Electronically Signed   By: Lesia Hausen M.D.   On: 04/09/2023 13:53   CT HEAD CODE STROKE WO CONTRAST`  Result Date: 04/09/2023 CLINICAL DATA:  Code stroke. Aphasia, facial droop. History of stroke with left-sided weakness. EXAM: CT HEAD WITHOUT CONTRAST TECHNIQUE: Contiguous axial images were obtained from the base of the skull through the vertex without intravenous contrast. RADIATION DOSE REDUCTION: This exam was performed  according to the departmental dose-optimization program which includes automated exposure control, adjustment of the mA and/or kV according to patient size and/or use of iterative reconstruction technique. COMPARISON:  CT head 10/02/2021, brain MRI 09/15/2021 FINDINGS: Brain: There is no evidence of acute intracranial hemorrhage, extra-axial fluid collection, or acute territorial infarct. A large area of encephalomalacia in the right frontal lobe with associated ex vacuo dilatation of the right lateral ventricle is unchanged. The ventricles are stable in size. Gray-white differentiation is otherwise preserved. The pituitary and suprasellar region are normal. There is no mass lesion. There is no mass effect or midline shift. Vascular: No hyperdense vessel or unexpected calcification. Skull: Postsurgical changes reflecting right frontal craniotomy are noted. There is no acute calvarial fracture. Sinuses/Orbits: The imaged paranasal sinuses are clear. The imaged globes and orbits are unremarkable. Other: None. ASPECTS [facility] Stroke Program Early CT Score) - Ganglionic level infarction (caudate, lentiform nuclei, internal capsule, insula, M1-M3 cortex): 7 - Supraganglionic infarction (M4-M6 cortex): 3 Total score (0-10 with 10 being normal): 10 IMPRESSION: No acute intracranial pathology. Unchanged large area of encephalomalacia in the right frontal lobe. These results were called by telephone at the time of interpretation on 04/09/2023 at 1:10 pm to provider HAYLEY NAASZ , who verbally acknowledged these results. Electronically Signed   By: Lesia Hausen M.D.   On: 04/09/2023 13:30   DG Chest 2 View  Result Date: 03/15/2023 CLINICAL DATA:  Hypoxic EXAM: CHEST - 2 VIEW COMPARISON:  06/29/2021 FINDINGS: Shallow inspiration. Heart size and pulmonary vascularity are normal for technique. Probable atelectasis in the lung bases. No pleural effusions. No pneumothorax. No focal consolidation. Mediastinal contours appear  intact. IMPRESSION: Shallow inspiration with likely atelectasis in the lung bases. No focal consolidation. Electronically Signed   By: Burman Nieves M.D.   On: 03/15/2023 22:23    Microbiology: Results for orders placed or performed during the hospital encounter of 11/15/20  SARS Coronavirus 2 by RT PCR (hospital order, performed in [facility] hospital lab) Nasopharyngeal Nasopharyngeal Swab     Status: None   Collection Time: 12/06/20  6:50 PM   Specimen: Nasopharyngeal Swab  Result Value Ref Range Status   SARS Coronavirus 2 NEGATIVE NEGATIVE Final    Comment: (NOTE) SARS-CoV-2 target nucleic acids are NOT DETECTED.  The SARS-CoV-2 RNA is generally detectable in upper and lower respiratory specimens during the acute phase of infection. The lowest concentration of SARS-CoV-2 viral copies this assay can detect is 250 copies / mL. A negative result does not preclude SARS-CoV-2 infection and should not be used as the sole basis for treatment or other patient management decisions.  A negative result may occur with improper specimen collection / handling, submission of specimen other than nasopharyngeal swab, presence of viral mutation(s) within the areas targeted by this assay, and inadequate number of viral copies (<250 copies / mL). A negative result must be combined with clinical observations, patient history, and epidemiological information.  Fact Sheet for Patients:   BoilerBrush.com.cy  Fact Sheet for Healthcare Providers: https://pope.com/  This test is not yet approved or  cleared by the [place] FDA and has been authorized for detection and/or diagnosis of SARS-CoV-2 by FDA under an Emergency Use Authorization (EUA).  This EUA will remain in effect (meaning this test can be used) for the duration of the COVID-19 declaration under Section 564(b)(1) of the Act, 21 U.S.C. section 360bbb-3(b)(1), unless the authorization is  terminated or revoked sooner.  Performed at [facility], [address] [place], [place] [address]    Labs: CBC: Recent Labs  Lab 04/09/23 1003 04/09/23 1556 04/10/23 0502 04/12/23 0425  WBC 2.7* 4.7 6.2 4.2  NEUTROABS 1.1*  --   --   --   HGB 13.3 14.4 14.8 14.3  HCT 40.7 41.5 42.3 40.2  MCV 95.3 92.6 91.4 90.5  PLT 153 184 154 162   Basic Metabolic Panel: Recent Labs  Lab 04/09/23 1003 04/09/23 1556 04/10/23 0419 04/12/23 0425  NA 134*  --  137 138  K 3.5  --  4.0 4.3  CL 101  --  101 102  CO2 25  --  24 27  GLUCOSE 187*  --  135* 102*  BUN 24*  --  12 14  CREATININE 1.17 1.17 1.09 1.15  CALCIUM 8.1*  --  8.6* 8.6*  MG 2.3  --  2.3  --    Liver Function Tests: Recent Labs  Lab 04/09/23 1003  AST 23  ALT 13  ALKPHOS 48  BILITOT 0.5  PROT 6.2*  ALBUMIN 3.3*   CBG: Recent Labs  Lab 04/11/23 1204 04/11/23 1803 04/12/23 0028 04/12/23 0649 04/12/23 0700  GLUCAP 124* 98 120* 105* 92    Discharge time spent: greater than 30 minutes.  Signed: Lynden Oxford, MD [facility]

## 2023-04-12 NOTE — Progress Notes (Signed)
[facility] PROGRESS NOTE  Patient: [name] ZOX:[id number]   PCP: Lucretia Field DOB: [birth year]   DOA: 04/09/2023   DOS: 04/12/2023    Patient was not able to be discharged on 4/19 as group home will require training of the staff for a wheelchair assistance as well as his wheelchair will not be available until Monday. Monitor over the weekend.  Author: Lynden Oxford, MD [facility] 04/12/2023 3:59 PM   If 7PM-7AM, please contact night-coverage at [email or web address]

## 2023-04-12 NOTE — Progress Notes (Signed)
Speech Language Pathology Treatment: Dysphagia  Patient Details Name: [name] MRN: [id number] DOB: Nov 12, 1956 Today's Date: 04/12/2023 Time: 4098-1191 SLP Time Calculation (min) (ACUTE ONLY): 13 min  Assessment / Plan / Recommendation Clinical Impression  Pt's diet was downgraded by SLP to puree yesterday and MD requested to see today as he may be discharged. Per MD sister states he eats a mixture of puree and solid foods. Per RN pt refused to take po's with RN tech. Today he was more alert and cooperative to take trials with graham cracker although this continues to wax and wane. He masticated cracker timely without holding or pocketing. Consumed sips water without s/s aspiration. Applesauce trials without difficulty swallowing although he bit the spoon given and straw at times. Will upgrade to Dys 2 (minced), continue thin liquids, pills crushed and full assist. Follow up at group home for safety and efficiency with diet- given developmental delay and history of strokes his intake and abilities may fluctuate in regards to nutrition/swallow/intake.    HPI HPI: [name] is a 67 y.o. male with medical history significant of benign intracranial tumor status post craniotomy 2004, complex partial seizure, schizophrenia, intellectual disability/delay, dysphagia, Barrett's esophagus, GERD, hypothyroidism, HTN, HLD, previous CVA with residual left-sided weakness.   Group home has noted episodes of increasing left-sided weakness and slurred speech, decreased intake and communication. Du to his weakness and fall prior to ED arrival, code stroke was called.  CT of the head was negative. BSE 2021 rec NPO then transferred to CIR and eventually on Dys 2/thin. Per nurse pt is on a chopped diet at group home.      SLP Plan  Continue with current plan of care      Recommendations for follow up therapy are one component of a multi-disciplinary discharge planning process, led by the  attending physician.  Recommendations may be updated based on patient status, additional functional criteria and insurance authorization.    Recommendations  Diet recommendations: Dysphagia 2 (fine chop);Thin liquid Liquids provided via: Straw Medication Administration: Crushed with puree Supervision: Full supervision/cueing for compensatory strategies Compensations: Slow rate;Minimize environmental distractions;Small sips/bites Postural Changes and/or Swallow Maneuvers: Seated upright 90 degrees                  Oral care BID   Frequent or constant Supervision/Assistance Dysphagia, unspecified (R13.10)     Continue with current plan of care     Royce Macadamia  04/12/2023, 11:01 AM

## 2023-04-12 NOTE — Progress Notes (Signed)
Occupational Therapy Treatment Patient Details Name: [name] MRN: [id number] DOB: 1956/01/31 Today's Date: 04/12/2023   History of present illness Pt is a 66 y.o. male who presented 04/09/23 with decreased responsiveness followed by worsening of his underlying symptoms. CT of head showed no acute intracranial pathology. Pt being worked up for potential seizure. 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 Patient was min assist to get to EOB and required RUE for sitting balance. Several attempts made to stand with RW from EOB with patient grabbing therapist by the neck. Patient stood from EOB to RW with max assist and demonstrated poor balance and safety and returned to EOB. Face to face technique performed for transfer to recliner for patient safety. Grooming and UB dressing performed seated in recliner. Patient would benefit from continued OT treatment to address self care and functional transfers. Acute OT to continue to follow.    Recommendations for follow up therapy are one component of a multi-disciplinary discharge planning process, led by the attending physician.  Recommendations may be updated based on patient status, additional functional criteria and insurance authorization.    Assistance Recommended at Discharge Frequent or constant Supervision/Assistance  Patient can return home with the following  A lot of help with walking and/or transfers;A lot of help with bathing/dressing/bathroom;Assistance with cooking/housework;Assistance with feeding;Direct supervision/assist for medications management;Direct supervision/assist for financial management;Assist for transportation;Help with stairs or ramp for  entrance   Equipment Recommendations  BSC/3in1    Recommendations for Other Services      Precautions / Restrictions Precautions Precautions: Fall;Other (comment) Precaution Comments: can be resistive/aggressive Restrictions Weight Bearing Restrictions: No       Mobility Bed Mobility Overal bed mobility: Needs Assistance Bed Mobility: Supine to Sit     Supine to sit: Min assist, HOB elevated     General bed mobility comments: cues for rail use and assistance scooting to edge    Transfers Overall transfer level: Needs assistance Equipment used:  (face to face technique) Transfers: Sit to/from Stand, Bed to chair/wheelchair/BSC Sit to Stand: Max assist Stand pivot transfers: Max assist         General transfer comment: Patient not wanting to use RW and grabbing at therapist neck. Attempted to stand from EOB once patient held onto walker but demonstrated poor balance and safety . face to face technique performed for increased safety with trasfer     Balance Overall balance assessment: Needs assistance Sitting-balance support: Single extremity supported, Feet supported Sitting balance-Leahy Scale: Poor Sitting balance - Comments: reliant on RUE support while seated on EOB Postural control: Posterior lean Standing balance support: Single extremity supported, Bilateral upper extremity supported Standing balance-Leahy Scale: Poor Standing balance comment: max assist for support when standing                           ADL either performed or assessed with clinical judgement   ADL Overall ADL's : Needs assistance/impaired     Grooming: Wash/dry face;Supervision/safety;Sitting Grooming Details (indicate cue type and reason): max verbal cues to perform         Upper Body Dressing : Moderate assistance;Sitting Upper Body Dressing Details (indicate cue type and reason): to change gown  Extremity/Trunk Assessment               Vision       Perception     Praxis      Cognition Arousal/Alertness: Awake/alert Behavior During Therapy: Flat affect Overall Cognitive Status: History of cognitive impairments - at baseline                                 General Comments: Hx of cognitive deficits, difficulty following directions, poor safety        Exercises      Shoulder Instructions       General Comments      Pertinent Vitals/ Pain       Pain Assessment Pain Assessment: Faces Faces Pain Scale: No hurt Pain Intervention(s): Monitored during session  Home Living                                          Prior Functioning/Environment              Frequency  Min 2X/week        Progress Toward Goals  OT Goals(current goals can now be found in the care plan section)  Progress towards OT goals: Progressing toward goals  Acute Rehab OT Goals OT Goal Formulation: Patient unable to participate in goal setting Time For Goal Achievement: 05/01/23 Potential to Achieve Goals: Fair ADL Goals Pt Will Perform Grooming: with supervision;sitting Pt Will Perform Upper Body Bathing: with min assist;sitting Pt Will Perform Upper Body Dressing: with min assist;sitting Pt Will Transfer to Toilet: with mod assist;bedside commode;stand pivot transfer Pt Will Perform Toileting - Clothing Manipulation and hygiene: with min assist;sit to/from stand Additional ADL Goal #1: Pt will perform bed mobility with min assist in preparation for ADLs.  Plan Discharge plan remains appropriate    Co-evaluation                 AM-PAC OT "6 Clicks" Daily Activity     Outcome Measure   Help from another person eating meals?: A Little Help from another person taking care of personal grooming?: A Little Help from another person toileting, which includes using toliet, bedpan, or urinal?: Total Help from another person bathing (including washing, rinsing, drying)?:  Total Help from another person to put on and taking off regular upper body clothing?: Total Help from another person to put on and taking off regular lower body clothing?: Total 6 Click Score: 10    End of Session Equipment Utilized During Treatment: Gait belt;Rolling walker (2 wheels)  OT Visit Diagnosis: Unsteadiness on feet (R26.81);Muscle weakness (generalized) (M62.81);Other symptoms and signs involving cognitive function   Activity Tolerance Patient tolerated treatment well   Patient Left in chair;with call bell/phone within reach;with chair alarm set;Other (comment) (ST beginning her treatment)   Nurse Communication Mobility status;Need for lift equipment        Time: 1024-1046 OT Time Calculation (min): 22 min  Charges: OT General Charges $OT Visit: 1 Visit OT Treatments $Therapeutic Activity: 8-22 mins  Alfonse Flavors, OTA Acute Rehabilitation Services  Office [phone]   Dewain Penning 04/12/2023, 1:15 PM

## 2023-04-12 NOTE — TOC Progression Note (Addendum)
Transition of Care [facility]) - Progression Note    Patient Details  Name: [name] MRN: [id number] Date of Birth: [birth year]  [facility] CM/SW Contact  Kermit Balo, RN Phone Number: 04/12/2023, 1:29 PM  Clinical Narrative:    Pt needs wheelchair at the Group home. They dont have one there for him to use. CM attempted to get one through [employer] but he received a transport chair in 2022 and insurance wont cover a wheelchair. Cm has updated sister and [facility]. [facility] and sister dont have the transport chair. [facility] is working on getting him a Consulting civil engineer on his new needs. They wont have this accomplished until Monday. CM has updated the MD.  Plan will be for d/c on Monday when [facility] has the needed DME and the staff has been trained. They will transport him home on Monday. Outpatient therapy arranged through [facility] with information on the AVS. Group home to call and schedule the first appointment. TOC following.   Expected Discharge Plan: Group Home Barriers to Discharge: Continued Medical Work up  Expected Discharge Plan and Services   Discharge Planning Services: CM Consult   Living arrangements for the past 2 months: Group Home Expected Discharge Date: 04/12/23                                     Social Determinants of Health (SDOH) Interventions SDOH Screenings   Food Insecurity: No Food Insecurity (04/09/2023)  Housing: Low Risk  (04/09/2023)  Transportation Needs: No Transportation Needs (04/09/2023)  Utilities: Not At Risk (04/09/2023)  Depression (PHQ2-9): Low Risk  (07/10/2022)  Tobacco Use: Low Risk  (04/09/2023)    Readmission Risk Interventions     No data to display

## 2023-04-13 DIAGNOSIS — R531 Weakness: Secondary | ICD-10-CM | POA: Diagnosis not present

## 2023-04-13 LAB — BASIC METABOLIC PANEL
Anion gap: 8 (ref 5–15)
BUN: 16 mg/dL (ref 8–23)
CO2: 27 mmol/L (ref 22–32)
Calcium: 8.6 mg/dL — ABNORMAL LOW (ref 8.9–10.3)
Chloride: 98 mmol/L (ref 98–111)
Creatinine, Ser: 1.13 mg/dL (ref 0.61–1.24)
GFR, Estimated: >60 mL/min (ref >60)
Glucose, Bld: 115 mg/dL — ABNORMAL HIGH (ref 70–99)
Potassium: 5 mmol/L (ref 3.5–5.1)
Sodium: 133 mmol/L — ABNORMAL LOW (ref 135–145)

## 2023-04-13 LAB — GLUCOSE, CAPILLARY
Glucose-Capillary: 98 mg/dL (ref 70–99)
Glucose-Capillary: 150 mg/dL — ABNORMAL HIGH (ref 70–99)
Glucose-Capillary: 97 mg/dL (ref 70–99)

## 2023-04-13 LAB — CBC
HCT: 45.8 % (ref 39.0–52.0)
Hemoglobin: 15.4 g/dL (ref 13.0–17.0)
MCH: 31.4 pg (ref 26.0–34.0)
MCHC: 33.6 g/dL (ref 30.0–36.0)
MCV: 93.5 fL (ref 80.0–100.0)
Platelets: 165 10*3/uL (ref 150–400)
RBC: 4.9 MIL/uL (ref 4.22–5.81)
RDW: 11.6 % (ref 11.5–15.5)
WBC: 5.8 10*3/uL (ref 4.0–10.5)
nRBC: 0 % (ref 0.0–0.2)

## 2023-04-13 NOTE — Plan of Care (Signed)

## 2023-04-13 NOTE — Plan of Care (Signed)
  Problem: Ischemic Stroke/TIA Tissue Perfusion: Goal: Complications of ischemic stroke/TIA will be minimized Outcome: Progressing   Problem: Coping: Goal: Will identify appropriate support needs Outcome: Progressing   Problem: Health Behavior/Discharge Planning: Goal: Goals will be collaboratively established with patient/family Outcome: Progressing   Problem: Education: Goal: Knowledge of disease or condition will improve Outcome: Not Progressing   Problem: Coping: Goal: Will verbalize positive feelings about self Outcome: Not Progressing   Problem: Self-Care: Goal: Verbalization of feelings and concerns over difficulty with self-care will improve Outcome: Not Progressing Goal: Ability to communicate needs accurately will improve Outcome: Not Progressing

## 2023-04-13 NOTE — Progress Notes (Signed)
[facility] PROGRESS NOTE  Patient: MAIKOL GRASSIA ION:[id number]   PCP: Lucretia Field DOB: 09-Nov-1956   DOA: 04/09/2023   DOS: 04/13/2023    Subjective: No acute events overnight.  Denies any acute complaints right now.  No pain.  Objective:   Vitals:   04/13/23 0500 04/13/23 0756 04/13/23 1147 04/13/23 1630  BP:  133/88 124/63 132/70  Pulse:  80 63 88  Resp: Temp:  97.9 F (36.6 C) 98.1 F (36.7 C) 98.5 F (36.9 C)  TempSrc:  Oral Oral Oral  SpO2:  96% 95% 93%  Weight:      Height:        S1-S2 present Clear to auscultation. Bowel sound present. Able to bend his bilateral knee without any pain or grimacing.  Assessment and plan: Active Issues Acute confusion and generalized weakness. Appears to be close to baseline. Continue current care. Awaiting group home readiness before discharge.  Brief Hospital course  LOVELL NUTTALL is a 67 y.o. male with medical history significant of benign intracranial tumor status post craniotomy 2004, complex partial seizure, schizophrenia, intellectual disability/delay, dysphagia, Barrett's esophagus, GERD, hypothyroidism, HTN, HLD, previous CVA with residual left-sided weakness.  Presented as a code stroke.  Teleneurology was consulted.  Suspected likely seizure episode and present was transferred to [facility] for further workup.   Principal Problem:   Weakness Active Problems:   Hypothyroidism   Schizophrenia, unspecified type   Hemiplegia (HCC)   GERD   S/P repair of paraesophageal hernia   DM II (diabetes mellitus, type II), controlled   S/P percutaneous endoscopic gastrostomy (PEG) tube placement   S/P craniotomy   General weakness   Breakthrough seizure    Author: Lynden Oxford, MD [facility] 04/13/2023 5:57 PM   If 7PM-7AM, please contact night-coverage at [email or web address]

## 2023-04-14 LAB — GLUCOSE, CAPILLARY: Glucose-Capillary: 161 mg/dL — ABNORMAL HIGH (ref 70–99)

## 2023-04-14 LAB — VITAMIN B1: Vitamin B1 (Thiamine): 118.6 nmol/L (ref 66.5–200.0)

## 2023-04-14 NOTE — Progress Notes (Signed)
[facility] PROGRESS NOTE  Patient: [name] ZOX:[id number]   PCP: Lucretia Field DOB: Nov 18, 1956   DOA: 04/09/2023   DOS: 04/14/2023    Subjective: No nausea no vomiting.  No acute events.  Objective:   Vitals:   04/13/23 1630 04/13/23 1957 04/14/23 0056 04/14/23 0319  BP: 132/70 118/74 125/77 118/75  Pulse: 88 79 97 72  Resp: Temp: 98.5 F (36.9 C) 98.1 F (36.7 C) 97.7 F (36.5 C) 97.7 F (36.5 C)  TempSrc: Oral Oral Oral Oral  SpO2: 93% 93% 91% 96%  Weight:      Height:        Clear to auscultation. S1-S2 present. No tenderness. Bowel sound present. No edema.  Assessment and plan: Active Issues Attendant feeding assistance. Acute confusional state appears to have resolved. Medically stable.  Brief Hospital course  [name] is a 67 y.o. male with medical history significant of benign intracranial tumor status post craniotomy 2004, complex partial seizure, schizophrenia, intellectual disability/delay, dysphagia, Barrett's esophagus, GERD, hypothyroidism, HTN, HLD, previous CVA with residual left-sided weakness.  Presented as a code stroke.  Teleneurology was consulted.  Suspected likely seizure episode and present was transferred to [facility] for further workup. LTM EEG as well as other workup is negative.  Patient back to baseline.  Awaiting transfer back to group home.   Principal Problem:   Weakness Active Problems:   Hypothyroidism   Schizophrenia, unspecified type   Hemiplegia (HCC)   GERD   S/P repair of paraesophageal hernia   DM II (diabetes mellitus, type II), controlled   S/P percutaneous endoscopic gastrostomy (PEG) tube placement   S/P craniotomy   General weakness   Breakthrough seizure    Author: Lynden Oxford, MD [facility] 04/14/2023 4:54 PM   If 7PM-7AM, please contact night-coverage at [email or web address]

## 2023-04-14 NOTE — Plan of Care (Signed)
  Problem: Clinical Measurements: Goal: Respiratory complications will improve Outcome: Progressing   Problem: Education: Goal: Knowledge of disease or condition will improve Outcome: Not Progressing Goal: Knowledge of secondary prevention will improve (MUST DOCUMENT ALL) Outcome: Not Progressing Goal: Knowledge of patient specific risk factors will improve Loraine Leriche N/A or DELETE if not current risk factor) Outcome: Not Progressing   Problem: Coping: Goal: Will verbalize positive feelings about self Outcome: Not Progressing Goal: Will identify appropriate support needs Outcome: Not Progressing   Problem: Health Behavior/Discharge Planning: Goal: Ability to manage health-related needs will improve Outcome: Not Progressing Goal: Goals will be collaboratively established with patient/family Outcome: Not Progressing   Problem: Self-Care: Goal: Ability to participate in self-care as condition permits will improve Outcome: Not Progressing Goal: Verbalization of feelings and concerns over difficulty with self-care will improve Outcome: Not Progressing Goal: Ability to communicate needs accurately will improve Outcome: Not Progressing   Problem: Education: Goal: Knowledge of General Education information will improve Description: Including pain rating scale, medication(s)/side effects and non-pharmacologic comfort measures Outcome: Not Progressing   Problem: Clinical Measurements: Goal: Ability to maintain clinical measurements within normal limits will improve Outcome: Not Progressing Goal: Cardiovascular complication will be avoided Outcome: Not Progressing   Problem: Activity: Goal: Risk for activity intolerance will decrease Outcome: Not Progressing

## 2023-04-14 NOTE — Plan of Care (Signed)

## 2023-04-15 DIAGNOSIS — R531 Weakness: Secondary | ICD-10-CM | POA: Diagnosis not present

## 2023-04-15 LAB — GLUCOSE, CAPILLARY
Glucose-Capillary: 143 mg/dL — ABNORMAL HIGH (ref 70–99)
Glucose-Capillary: 120 mg/dL — ABNORMAL HIGH (ref 70–99)
Glucose-Capillary: 96 mg/dL (ref 70–99)
Glucose-Capillary: 118 mg/dL — ABNORMAL HIGH (ref 70–99)

## 2023-04-15 MED ORDER — METOPROLOL TARTRATE 25 MG PO TABS
25.0000 mg | ORAL_TABLET | Freq: Two times a day (BID) | ORAL | Status: DC
  Administered 2023-04-15: 25 mg via ORAL
  Filled 2023-04-15: qty 1

## 2023-04-15 NOTE — Progress Notes (Signed)
This patient's HR went up to 130s 2 times earlier but it did not sustain. He was asymptomatic when I checked on him. He's fluctuating from 107 to 110s. Notified Dr. Margo Aye without any new orders. Will continue to monitor.

## 2023-04-15 NOTE — Progress Notes (Addendum)
Physical Therapy Treatment Patient Details Name: [name] MRN: [id number] DOB: 08/09/1956 Today's Date: 04/15/2023   History of Present Illness Pt is a 67 y.o. male who presented 04/09/23 with decreased responsiveness followed by worsening of his underlying symptoms. CT of head showed no acute intracranial pathology. Pt being worked up for potential seizure. 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 Pt requiring min assist at trunk to help sit up. Taking pivotal steps to and from bedside commode with two person moderate assist for balance and safety. Displays narrow BOS and increased posterior lean; hand over hand guidance provided for holding onto RW. Assist provided for donning/doffing underwear and pants in standing. Gait belt provided and placed in bag for group home staff to use.     Recommendations for follow up therapy are one component of a multi-disciplinary discharge planning process, led by the attending physician.  Recommendations may be updated based on patient status, additional functional criteria and insurance authorization.  Follow Up Recommendations  Can patient physically be transported by private vehicle: No    Assistance Recommended at Discharge Frequent or constant Supervision/Assistance  Patient can return home with the following Two people to help with walking and/or transfers;A lot of help with bathing/dressing/bathroom;Assistance with cooking/housework;Direct supervision/assist for medications management;Direct supervision/assist for financial  management;Assist for transportation;Help with stairs or ramp for entrance   Equipment Recommendations  BSC/3in1;Wheelchair (measurements PT);Wheelchair cushion (measurements PT)    Recommendations for Other Services       Precautions / Restrictions Precautions Precautions: Fall;Other (comment) Precaution Comments: can be resistive/aggressive Restrictions Weight Bearing Restrictions: No     Mobility  Bed Mobility Overal bed mobility: Needs Assistance Bed Mobility: Supine to Sit, Sit to Supine     Supine to sit: HOB elevated, Max assist, +2 for physical assistance, +2 for safety/equipment, Min assist Sit to supine: HOB elevated, Min guard   General bed mobility comments: Pt resisting on initial attempt to sit up on edge of bed; after multiple attempts to incentivize, pt sitting up with minA to steady trunk and use of bed pad to scoot hips out. Pt returning self to bed at end of session    Transfers Overall transfer level: Needs assistance Equipment used: Rolling walker (2 wheels) Transfers: Sit to/from Stand, Bed to chair/wheelchair/BSC Sit to Stand: +2 safety/equipment, +2 physical assistance, Mod assist Stand pivot transfers: Mod assist, +2 physical assistance         General transfer comment: Hand over hand guidance for placement on RW, modA to power up to walker and pivot to and from [facility]. Pt utilzing narrow BOS with retropulsion and ataxic movements    Ambulation/Gait                   Stairs             Wheelchair Mobility    Modified Rankin (Stroke Patients Only) Modified Rankin (Stroke Patients Only) Pre-Morbid Rankin Score: Moderate disability Modified Rankin: Moderately severe disability     Balance Overall balance assessment: Needs assistance Sitting-balance support: Single extremity supported, Feet supported Sitting balance-Leahy Scale: Poor Sitting balance - Comments: close min guard   Standing  balance support: Bilateral upper  extremity supported Standing balance-Leahy Scale: Poor Standing balance comment: UE support in standing                            Cognition Arousal/Alertness: Awake/alert Behavior During Therapy: Flat affect, Agitated Overall Cognitive Status: History of cognitive impairments - at baseline                                 General Comments: Hx of cognitive deficits. Resistive of efforts to mobilize. asking for TV to be turned on        Exercises      General Comments        Pertinent Vitals/Pain Pain Assessment Pain Assessment: Faces Faces Pain Scale: No hurt    Home Living                          Prior Function            PT Goals (current goals can now be found in the care plan section) Acute Rehab PT Goals Patient Stated Goal: did not state; sister wishes to understand what is going on with pt PT Goal Formulation: With patient/family Time For Goal Achievement: 04/24/23 Potential to Achieve Goals: Fair    Frequency    Min 3X/week      PT Plan Current plan remains appropriate    Co-evaluation              AM-PAC PT "6 Clicks" Mobility   Outcome Measure  Help needed turning from your back to your side while in a flat bed without using bedrails?: A Lot Help needed moving from lying on your back to sitting on the side of a flat bed without using bedrails?: Total Help needed moving to and from a bed to a chair (including a wheelchair)?: Total Help needed standing up from a chair using your arms (e.g., wheelchair or bedside chair)?: Total Help needed to walk in hospital room?: Total Help needed climbing 3-5 steps with a railing? : Total 6 Click Score: 7    End of Session Equipment Utilized During Treatment: Gait belt Activity Tolerance: Other (comment);Treatment limited secondary to agitation (self-limiting) Patient left: in bed;with call bell/phone within reach;with bed alarm set Nurse Communication: Mobility  status PT Visit Diagnosis: Unsteadiness on feet (R26.81);Other abnormalities of gait and mobility (R26.89);Muscle weakness (generalized) (M62.81);Difficulty in walking, not elsewhere classified (R26.2);Other symptoms and signs involving the nervous system (R29.898)     Time: 4098-1191 PT Time Calculation (min) (ACUTE ONLY): 24 min  Charges:  $Therapeutic Activity: 23-37 mins                     Lillia Pauls, PT, DPT Acute Rehabilitation Services Office [phone]    Norval Morton 04/15/2023, 11:51 AM

## 2023-04-15 NOTE — Plan of Care (Signed)
  Problem: Health Behavior/Discharge Planning: Goal: Goals will be collaboratively established with patient/family Outcome: Progressing   Problem: Nutrition: Goal: Risk of aspiration will decrease Outcome: Progressing   Problem: Clinical Measurements: Goal: Ability to maintain clinical measurements within normal limits will improve Outcome: Progressing Goal: Will remain free from infection Outcome: Progressing Goal: Respiratory complications will improve Outcome: Progressing   Problem: Education: Goal: Knowledge of disease or condition will improve Outcome: Not Progressing   Problem: Coping: Goal: Will verbalize positive feelings about self Outcome: Not Progressing   Problem: Self-Care: Goal: Verbalization of feelings and concerns over difficulty with self-care will improve Outcome: Not Progressing Goal: Ability to communicate needs accurately will improve Outcome: Not Progressing   Problem: Nutrition: Goal: Dietary intake will improve Outcome: Not Progressing   Problem: Activity: Goal: Risk for activity intolerance will decrease Outcome: Not Progressing

## 2023-04-15 NOTE — Care Management Important Message (Signed)
Important Message  Patient Details  Name: [name] MRN: [id number] Date of Birth: 31-Oct-1956   Medicare Important Message Given:  Yes     Tatiyana Foucher 04/15/2023, 3:00 PM

## 2023-04-15 NOTE — TOC Transition Note (Signed)
Transition of Care [facility]) - CM/SW Discharge Note   Patient Details  Name: [name] MRN: [id number] Date of Birth: 03-05-1956  Transition of Care [facility]) CM/SW Contact:  Kermit Balo, RN Phone Number: 04/15/2023, 10:50 AM   Clinical Narrative:    Group home is purchasing him a wheelchair this am. They state the staff is trained and ready.  CM has faxed them the d/c summary. They are also asking for therapy notes from today. CM will fax when available.  Group home will transport home.   Final next level of care: Group Home (with outpt therapy) Barriers to Discharge: No Barriers Identified   Patient Goals and CMS Choice CMS Medicare.gov Compare Post Acute Care list provided to:: Other (Comment Required) (Group home)    Discharge Placement                         Discharge Plan and Services Additional resources added to the After Visit Summary for     Discharge Planning Services: CM Consult                                 Social Determinants of Health (SDOH) Interventions SDOH Screenings   Food Insecurity: No Food Insecurity (04/09/2023)  Housing: Low Risk  (04/09/2023)  Transportation Needs: No Transportation Needs (04/09/2023)  Utilities: Not At Risk (04/09/2023)  Depression (PHQ2-9): Low Risk  (07/10/2022)  Tobacco Use: Low Risk  (04/09/2023)     Readmission Risk Interventions     No data to display

## 2023-04-15 NOTE — Discharge Summary (Signed)
Physician Discharge Summary   Patient: [name] MRN: [id number] DOB: December 12, 1956  Admit date:     04/09/2023  Discharge date: 04/15/23  Discharge Physician: Lynden Oxford  PCP: Lucretia Field  Recommendations at discharge:  Follow up with PCP in 1 week and Neurology as recommended   Follow-up Information      [facility] Schedule an appointment as soon as possible for a visit in 1 week(s).   Specialty: Rehabilitation Contact information: [address] [place], [address] [id number] mc [place] [address] [phone]        Royals, Gretta Began. Schedule an appointment as soon as possible for a visit in 1 week(s).   Specialty: Family Medicine Contact information: [address] [place] [address] [phone]         Levert Feinstein, MD. Schedule an appointment as soon as possible for a visit in 2 week(s).   Specialty: Neurology Contact information: [address] [place] [address] [phone]                Discharge Diagnoses: Principal Problem:   Weakness Active Problems:   Hypothyroidism   Schizophrenia, unspecified type   Hemiplegia (HCC)   GERD   S/P repair of paraesophageal hernia   DM II (diabetes mellitus, type II), controlled   S/P percutaneous endoscopic gastrostomy (PEG) tube placement   S/P craniotomy   General weakness   Breakthrough seizure  Hospital Course:  [name] is a 67 y.o. male with medical history significant of benign intracranial tumor status post craniotomy 2004, complex partial seizure, schizophrenia, intellectual disability/delay, dysphagia, Barrett's esophagus, GERD, hypothyroidism, HTN, HLD, previous CVA with residual left-sided weakness.  Presented as a code stroke.  Teleneurology was consulted.  Suspected likely seizure episode and present was transferred to [facility] for further workup. LTM EEG as well as other workup is  negative.  Patient back to baseline.  Awaiting transfer back to group home. Assessment and Plan  Acute confusional state most likely secondary to seizures  generalized weakness History of CVA with residual left-sided weakness Initially presented as a code stroke at [facility]. CT head unremarkable. There was some concern for seizures at the time of evaluation. CTA head and neck was negative for any large vessel occlusion. Transferred to [facility] for LTM EEG monitoring. On Depakote and lamotrigine. Dose was recently increased at the facility from 500 mg bid to 750 mg bid. No obvious metabolic derangement. Depakote level within therapeutic range. Lamotrigine level also therapeutic TSH, folic acid, B12 and RPR also reassuringly normal. UA and UDS- neg.  Neurology was consulted. LTM EEG did not show any evidence of acute seizures. Mentation improved and therefore neurology signed off on 4/18.  Recommending no changes so far in his medication. Follow up with primary neuro in 2 weeks.    GERD/Barret's esophagus PPI   Hypothyroidism Synthroid 175 mcg.   HTN Blood pressure stable without any medication right now. On benazepril, metoprolol at home. Will stop benazepril and resume metoprolol.    Intellectual disability -On Seroquel.  Lamictal and depakote will also help stabilize the mood.  Recommendation is for outpt psych follow up 1 month after recent change in depakote dose.    HLD -Crestor   Hx Of Benign Tumor s/p Craniotomy    Left lower back injury  Reportedly has sustained some injury at the facility on his left lower back.  Since then appears that he is walking trying  to protect his left side from any pressure. Patient did not have any significant pain or tenderness at the time of my examination of the left hip area. X-ray lumbar spine as well as x-ray hip negative for any fracture.  Lidocaine patch and tylenol for pain control.   Dysphagia  Reportedly on  pureed diet since his surgery  Speech consulted.  Ok for D2 diet.   Consultants:  Neurology   Procedures performed:  LTM EEG  DISCHARGE MEDICATION: Allergies as of 04/15/2023       Reactions   Vancomycin Other (See Comments), Itching   Unknown-possible hives Unknown-possible hives, Hives.        Medication List     STOP taking these medications    benazepril 5 MG tablet Commonly known as: LOTENSIN       TAKE these medications    CORN HUSKERS EX Apply 1 application topically in the morning and at bedtime. For dry skin   divalproex 250 MG DR tablet Commonly known as: DEPAKOTE Take 1 tablet (250 mg total) by mouth 2 (two) times daily. With 500 mg for a total of 750 mg BID What changed: additional instructions   divalproex 500 MG DR tablet Commonly known as: DEPAKOTE Take 1 tablet (500 mg total) by mouth every 12 (twelve) hours. Along with 250 mg for a total of 750 mg BID What changed: additional instructions   docusate sodium 100 MG capsule Commonly known as: COLACE Take 1 capsule (100 mg total) by mouth 2 (two) times daily.   feeding supplement Liqd Take 237 mLs by mouth 3 (three) times daily between meals.   lamoTRIgine 100 MG tablet Commonly known as: LAMICTAL Take 1 tablet (100 mg total) by mouth at bedtime.   levothyroxine 175 MCG tablet Commonly known as: SYNTHROID Take 175 mcg by mouth daily. What changed: Another medication with the same name was removed. Continue taking this medication, and follow the directions you see here.   lidocaine 5 % Commonly known as: LIDODERM Place 1 patch onto the skin daily. Remove & Discard patch within 12 hours or as directed by MD   metoprolol tartrate 25 MG tablet Commonly known as: LOPRESSOR Take 1 tablet (25 mg total) by mouth 2 (two) times daily.   ondansetron 4 MG tablet Commonly known as: ZOFRAN Take 4 mg by mouth 4 (four) times daily as needed for nausea or vomiting.   pantoprazole 40 MG  tablet Commonly known as: PROTONIX Take 1 tablet (40 mg total) by mouth 2 (two) times daily before a meal.   polyethylene glycol 17 g packet Commonly known as: MIRALAX / GLYCOLAX Take 17 g by mouth 2 (two) times daily.   QUEtiapine 400 MG tablet Commonly known as: SEROQUEL Take 1 tablet (400 mg total) by mouth 2 (two) times daily.   rosuvastatin 20 MG tablet Commonly known as: CRESTOR Take 1 tablet (20 mg total) by mouth daily.   Vitamin D 50 MCG (2000 UT) tablet Take 1 tablet (2,000 Units total) by mouth daily.               Durable Medical Equipment  (From admission, onward)           Start     Ordered   04/12/23 1009  For home use only DME lightweight manual wheelchair with seat cushion  Once       Comments: Patient suffers from weakness which impairs their ability to perform daily activities like bathing and grooming in the home.  A walker will not resolve  issue with performing activities of daily living. A wheelchair will allow patient to safely perform daily activities. Patient is not able to propel themselves in the home using a standard weight wheelchair due to general weakness. Patient can self propel in the lightweight wheelchair. Length of need Lifetime. Accessories: elevating leg rests (ELRs), wheel locks, extensions and anti-tippers.   04/12/23 1009           Disposition: Home Diet recommendation: Dysphagia type 2 thin Liquid  Discharge Exam: Vitals:   04/14/23 2114 04/14/23 2343 04/15/23 0407 04/15/23 0834  BP: (!) 140/95 116/87 126/89 131/86  Pulse: 87 (!) 107 81 86  Resp: Temp: 98.5 F (36.9 C) 98.4 F (36.9 C) 97.6 F (36.4 C) 98.5 F (36.9 C)  TempSrc: Axillary Axillary Axillary Oral  SpO2: 95% 90% 94% 95%  Weight:      Height:       General: Appear in no distress; no visible Abnormal Neck Mass Or lumps, Conjunctiva normal Cardiovascular: S1 and S2 Present, no Murmur, Respiratory: good respiratory effort, Bilateral Air  entry present and CTA, no Crackles, no wheezes Abdomen: Bowel Sound present, Non tender  Extremities: no Pedal edema Neurology: alert and oriented to self  [facility]ights   04/09/23 0955  Weight: 65 kg   Condition at discharge: stable  The results of significant diagnostics from this hospitalization (including imaging, microbiology, ancillary and laboratory) are listed below for reference.   Imaging Studies: DG HIP UNILAT WITH PELVIS 2-3 VIEWS LEFT  Result Date: 04/11/2023 CLINICAL DATA:  Trauma EXAM: DG HIP (WITH OR WITHOUT PELVIS) 2-3V LEFT COMPARISON:  Radiographs 05/23/2020 FINDINGS: Stable appearance of fragmented osteophyte along the acetabulum, no change from 05/05/2020. No visible fracture or acute bony findings. IMPRESSION: 1. No acute findings. 2. Stable fragmented osteophyte along the acetabulum. Electronically Signed   By: Gaylyn Rong M.D.   On: 04/11/2023 19:58   DG Lumbar Spine 2-3 Views  Result Date: 04/11/2023 CLINICAL DATA:  Trauma. EXAM: LUMBAR SPINE - 2-3 VIEW COMPARISON:  Lumbar spine radiograph dated 05/05/2020. FINDINGS: Five lumbar type vertebra. There is no acute fracture or subluxation of the lumbar spine. Mild multilevel chronic compression deformity. The visualized posterior elements are intact. The soft tissues are unremarkable. IMPRESSION: No acute/traumatic lumbar spine pathology. Electronically Signed   By: Elgie Collard M.D.   On: 04/11/2023 19:43   MR BRAIN WO CONTRAST  Result Date: 04/10/2023 CLINICAL DATA:  TIA. History of right frontal lobe stroke post ischemic seizure disorder. Benign intracranial tumor status post resection in 2004. EXAM: MRI HEAD WITHOUT CONTRAST TECHNIQUE: Multiplanar, multiecho pulse sequences of the brain and surrounding structures were obtained without intravenous contrast. COMPARISON:  MRI brain 09/15/2021. FINDINGS: Brain: No acute infarct or hemorrhage. Unchanged resection cavity with surrounding encephalomalacia and  gliosis in the right frontal lobe. No hydrocephalus or extra-axial collection. No mass effect or midline shift. Vascular: Normal flow voids. Skull and upper cervical spine: Prior right frontoparietal craniotomy. Sinuses/Orbits: Unremarkable. Other: None. IMPRESSION: 1. No acute intracranial process. 2. Unchanged resection cavity with surrounding encephalomalacia and gliosis in the right frontal lobe. Electronically Signed   By: Orvan Falconer M.D.   On: 04/10/2023 11:43   Overnight EEG with video  Result Date: 04/10/2023 Charlsie Quest, MD     04/11/2023  9:15 AM Patient Name: [name] MRN: [id number] Epilepsy Attending: Charlsie Quest Referring Physician/Provider: Caryl Pina, MD Duration: 04/09/2023 1917 to 04/10/2023 2030  Patient history: 67 year old male with a history of previous stroke as well as recurrent seizures who had an episode of decreased responsiveness with worsening of his left-sided deficits. EEG to evaluate for seizure. Level of alertness: Awake, asleep AEDs during EEG study: LTG, VPA Technical aspects: This EEG study was done with scalp electrodes positioned according to the 10-20 International system of electrode placement. Electrical activity was reviewed with band pass filter of 1-70Hz , sensitivity of 7 uV/mm, display speed of 80mm/sec with a 60Hz  notched filter applied as appropriate. EEG data were recorded continuously and digitally stored.  Video monitoring was available and reviewed as appropriate. Description: The posterior dominant rhythm consists of 7 Hz activity of moderate voltage (25-35 uV) seen predominantly in posterior head regions, symmetric and reactive to eye opening and eye closing. Sleep was characterized by vertex waves, sleep spindles (12 to 14 Hz), maximal frontocentral region. EEG showed continuous 3 to 6 Hz theta-delta slowing in right frontal region. Hyperventilation and photic stimulation were not performed.   EEG was disconnected between 04/10/2023  1043 to 1155 for MRI Brain ABNORMALITY - Continuous slow, right frontal region IMPRESSION: This study is suggestive of cortical dysfunction arising from right frontal region likely secondary to underlying encephalomalacia. No seizures or epileptiform discharges were seen throughout the recording. Priyanka Annabelle Harman   CT ANGIO HEAD NECK W WO CM (CODE STROKE)  Result Date: 04/09/2023 CLINICAL DATA:  Left-sided weakness. EXAM: CT ANGIOGRAPHY HEAD AND NECK WITH AND WITHOUT CONTRAST TECHNIQUE: Multidetector CT imaging of the head and neck was performed using the standard protocol during bolus administration of intravenous contrast. Multiplanar CT image reconstructions and MIPs were obtained to evaluate the vascular anatomy. Carotid stenosis measurements (when applicable) are obtained utilizing NASCET criteria, using the distal internal carotid diameter as the denominator. RADIATION DOSE REDUCTION: This exam was performed according to the departmental dose-optimization program which includes automated exposure control, adjustment of the mA and/or kV according to patient size and/or use of iterative reconstruction technique. CONTRAST:  75mL OMNIPAQUE IOHEXOL 350 MG/ML SOLN COMPARISON:  None Available. FINDINGS: CTA NECK FINDINGS Aortic arch: The imaged aortic arch is normal. The origins of the major branch vessels are patent. The subclavian arteries are patent to the level imaged. Right carotid system: The right common, internal, and external carotid arteries are patent, without hemodynamically significant stenosis or occlusion. There is no evidence of dissection or aneurysm. Left carotid system: The left common, internal, and external carotid arteries are patent, without hemodynamically significant stenosis or occlusion. There is no evidence of dissection or aneurysm. Vertebral arteries: The vertebral arteries are patent, without hemodynamically significant stenosis or occlusion there is no evidence of dissection or  aneurysm. Skeleton: There is no acute osseous abnormality or suspicious osseous lesion. There is no visible canal hematoma. Other neck: The soft tissues of the neck are unremarkable. The esophagus is markedly patulous, as seen on prior CT chest from 2021. There is debris in the upper trachea. Upper chest: The imaged lung apices are clear. Review of the MIP images confirms the above findings CTA HEAD FINDINGS Image quality is degraded by motion artifact at the skull base. Anterior circulation: The intracranial ICAs are patent without definite hemodynamically significant stenosis. The left MCAs are patent, without proximal high-grade stenosis or occlusion. There is diminished vascularity in the area of encephalomalacia in the right frontal lobe. The bilateral ACAs are patent, without proximal high-grade stenosis or occlusion. There is no aneurysm or AVM. Posterior circulation: The bilateral V4 segments are patent. The  basilar artery is patent but diminutive in caliber. The major cerebellar arteries appear patent. The bilateral PCAs are patent, without proximal high-grade stenosis or occlusion. The PCAs are supplied by prominent posterior communicating arteries bilaterally with diminutive P1 segments (fetal origins). There is no aneurysm or AVM. Venous sinuses: As permitted by contrast timing, patent. Anatomic variants: As above. Review of the MIP images confirms the above findings IMPRESSION: 1. Patent vasculature of the head and neck with no hemodynamically significant stenosis, occlusion, or dissection. 2. Patulous esophagus with debris in the upper trachea. Correlate with any signs or symptoms of aspiration. These results were called by telephone at the time of interpretation on 04/09/2023 at 1:44 pm to provider Dr. Amada Jupiter, who verbally acknowledged these results. Electronically Signed   By: Lesia Hausen M.D.   On: 04/09/2023 13:53   CT HEAD CODE STROKE WO CONTRAST`  Result Date: 04/09/2023 CLINICAL DATA:   Code stroke. Aphasia, facial droop. History of stroke with left-sided weakness. EXAM: CT HEAD WITHOUT CONTRAST TECHNIQUE: Contiguous axial images were obtained from the base of the skull through the vertex without intravenous contrast. RADIATION DOSE REDUCTION: This exam was performed according to the departmental dose-optimization program which includes automated exposure control, adjustment of the mA and/or kV according to patient size and/or use of iterative reconstruction technique. COMPARISON:  CT head 10/02/2021, brain MRI 09/15/2021 FINDINGS: Brain: There is no evidence of acute intracranial hemorrhage, extra-axial fluid collection, or acute territorial infarct. A large area of encephalomalacia in the right frontal lobe with associated ex vacuo dilatation of the right lateral ventricle is unchanged. The ventricles are stable in size. Gray-white differentiation is otherwise preserved. The pituitary and suprasellar region are normal. There is no mass lesion. There is no mass effect or midline shift. Vascular: No hyperdense vessel or unexpected calcification. Skull: Postsurgical changes reflecting right frontal craniotomy are noted. There is no acute calvarial fracture. Sinuses/Orbits: The imaged paranasal sinuses are clear. The imaged globes and orbits are unremarkable. Other: None. ASPECTS [facility] Stroke Program Early CT Score) - Ganglionic level infarction (caudate, lentiform nuclei, internal capsule, insula, M1-M3 cortex): 7 - Supraganglionic infarction (M4-M6 cortex): 3 Total score (0-10 with 10 being normal): 10 IMPRESSION: No acute intracranial pathology. Unchanged large area of encephalomalacia in the right frontal lobe. These results were called by telephone at the time of interpretation on 04/09/2023 at 1:10 pm to provider HAYLEY NAASZ , who verbally acknowledged these results. Electronically Signed   By: Lesia Hausen M.D.   On: 04/09/2023 13:30    Microbiology: Results for orders placed or  performed during the hospital encounter of 11/15/20  SARS Coronavirus 2 by RT PCR (hospital order, performed in [facility] hospital lab) Nasopharyngeal Nasopharyngeal Swab     Status: None   Collection Time: 12/06/20  6:50 PM   Specimen: Nasopharyngeal Swab  Result Value Ref Range Status   SARS Coronavirus 2 NEGATIVE NEGATIVE Final    Comment: (NOTE) SARS-CoV-2 target nucleic acids are NOT DETECTED.  The SARS-CoV-2 RNA is generally detectable in upper and lower respiratory specimens during the acute phase of infection. The lowest concentration of SARS-CoV-2 viral copies this assay can detect is 250 copies / mL. A negative result does not preclude SARS-CoV-2 infection and should not be used as the sole basis for treatment or other patient management decisions.  A negative result may occur with improper specimen collection / handling, submission of specimen other than nasopharyngeal swab, presence of viral mutation(s) within the areas targeted by this assay, and  inadequate number of viral copies (<250 copies / mL). A negative result must be combined with clinical observations, patient history, and epidemiological information.  Fact Sheet for Patients:   BoilerBrush.com.cy  Fact Sheet for Healthcare Providers: https://pope.com/  This test is not yet approved or  cleared by the [place] FDA and has been authorized for detection and/or diagnosis of SARS-CoV-2 by FDA under an Emergency Use Authorization (EUA).  This EUA will remain in effect (meaning this test can be used) for the duration of the COVID-19 declaration under Section 564(b)(1) of the Act, 21 U.S.C. section 360bbb-3(b)(1), unless the authorization is terminated or revoked sooner.  Performed at [facility], [address] [place], [place] [address]    Labs: CBC: Recent Labs  Lab 04/09/23 1003 04/09/23 1556 04/10/23 0502 04/12/23 0425 04/13/23 0435  WBC  2.7* 4.7 6.2 4.2 5.8  NEUTROABS 1.1*  --   --   --   --   HGB 13.3 14.4 14.8 14.3 15.4  HCT 40.7 41.5 42.3 40.2 45.8  MCV 95.3 92.6 91.4 90.5 93.5  PLT 153 184 154 162 165    Basic Metabolic Panel: Recent Labs  Lab 04/09/23 1003 04/09/23 1556 04/10/23 0419 04/12/23 0425 04/13/23 0435  NA 134*  --  137 138 133*  K 3.5  --  4.0 4.3 5.0  CL 101  --  101 102 98  CO2 25  --  24 27 27   GLUCOSE 187*  --  135* 102* 115*  BUN 24*  --  12 14 16   CREATININE 1.17 1.17 1.09 1.15 1.13  CALCIUM 8.1*  --  8.6* 8.6* 8.6*  MG 2.3  --  2.3  --   --     Liver Function Tests: Recent Labs  Lab 04/09/23 1003  AST 23  ALT 13  ALKPHOS 48  BILITOT 0.5  PROT 6.2*  ALBUMIN 3.3*    CBG: Recent Labs  Lab 04/13/23 0754 04/13/23 1633 04/13/23 2309 04/14/23 2346 04/15/23 0614  GLUCAP 98 150* 97 161* 143*     Discharge time spent: greater than 30 minutes.  Signed: Lynden Oxford, MD [facility]

## 2023-04-16 ENCOUNTER — Other Ambulatory Visit: Payer: Medicare Other | Admitting: *Deleted

## 2023-04-16 ENCOUNTER — Encounter: Payer: Self-pay | Admitting: *Deleted

## 2023-04-23 ENCOUNTER — Ambulatory Visit (INDEPENDENT_AMBULATORY_CARE_PROVIDER_SITE_OTHER): Payer: Medicare Other | Admitting: Neurology

## 2023-04-23 ENCOUNTER — Encounter: Payer: Self-pay | Admitting: Neurology

## 2023-04-23 VITALS — BP 118/70 | HR 91 | Ht 63.0 in | Wt 163.0 lb

## 2023-04-23 DIAGNOSIS — Z9889 Other specified postprocedural states: Secondary | ICD-10-CM

## 2023-04-23 DIAGNOSIS — I69954 Hemiplegia and hemiparesis following unspecified cerebrovascular disease affecting left non-dominant side: Secondary | ICD-10-CM

## 2023-04-23 DIAGNOSIS — G40909 Epilepsy, unspecified, not intractable, without status epilepticus: Secondary | ICD-10-CM

## 2023-04-23 MED ORDER — LAMOTRIGINE 100 MG PO TABS: 100.0000 mg | ORAL_TABLET | Freq: Every day | ORAL | 3 refills | Status: DC

## 2023-04-23 MED ORDER — DIVALPROEX SODIUM 125 MG PO CSDR: 750.0000 mg | DELAYED_RELEASE_CAPSULE | Freq: Two times a day (BID) | ORAL | 11 refills | Status: DC

## 2023-04-23 NOTE — Progress Notes (Signed)
Chief Complaint  Patient presents with   Follow-up    Rm 14. Patient with group home worker, Greig Castilla. Greig Castilla states recent hospital visit provider said his depakote levels were low, but patient has no complaints at this time      ASSESSMENT AND PLAN  [name] is a 67 y.o. 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  CT head without contrast from [facility] in April 2019: Encephalomalacia of right frontal lobe with small vessel disease in the superior right centrum semiovale, stable parietal craniotomy of the right and left superior frontal region,  Echocardiogram December 2021: Ejection fraction 65 to 70%  EEG October 21, 2020, mild to moderate diffuse encephalopathy, no epileptiform discharge   HISTORICAL data  [name] a 67 year old male, seen in request by his primary care PA [name], [name], PA for evaluation of passing out spells, he is accompanied by his sister [name], who is also his power of attorney at today's visit on March 09, 2021.  I reviewed and summarized the referring note.  Past medical history Stroke with residual left side weakness, Seizure, Intellectual delay Dysphagia, Barrett's esophagus, GERD with  gastroparesis, Hypothyroidism, on supplement Hypertension Hyperlipidemia Schizophrenia Diabetes History of Craniotomy,   Patient had a history of craniotomy for benign brain tumor removal in 2003/05/04 at [facility], postsurgically, he developed complications, required a second surgery, which is further complicated by stroke, with residual left-sided weakness.  The history is from his sister, I could not find the medical records through epic system  Patient also had lifelong history of schizophrenia, developmentally delayed, intellectual disability, lived with his father, who passed away in May 04, 2007, eventually he was placed at a group home since 05-04-11,  He began to have seizure following his craniotomy in May 04, 2003, was managed by outside neurologist, has been seizure-free for more than 10 years, stable on current medication of Depakote DR 500 mg twice a day, and lamotrigine 100 mg daily  In September 2021, he was found to be confused, altered mental status, hypotension, that was improved with hospital admission, hydration, hospital admission again in November 2021, for aspiration pneumonia, 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, periesophagus hernia, underwent robotic assistant laparoscopy, paraesophageal hernia repair, followed by prolonged rehabilitation,  His condition has gradually stabilized since, during the process, he was noted to have confusion, near syncope episode, fall at nursing home, there was no clinical seizure activity noted  Since the surgery, he has much improved, almost back to his baseline, he has unsteady gait, carry on simple conversation, has good appetite, residual left hemiparesis  UPDATE Oct 26 2021: He is accompanied by his group home staff Almira Bar at today's visit, who has known patient for 1 year, patient is overall doing well, has back to his baseline, have good appetite, sleeping well,  continue have language difficulty, gait abnormality, no  seizure-like activity  UPDate April 01 2023: He was accompanied by group home staff Denice Paradise at visit, who has known him for 2 months, described his difficulty using left side, which was actually present at previous examination, I tried to call his sister, power of attorney [name] without success,  Personally reviewed MRI of the brain September 2022, no acute abnormality, large cystic encephalomalacia at the right frontal lobe,  I reviewed emergency room visit on March 22, 2023,  he was taken to the emergency room for weakness, difficulty using his left side, sister is guardian, filed a complaining of non accidental trauma at a group home, Depakote level was low 43,,    Lab from March 15, 2023, Depakote level was 43, lamotrigine was 8.4, normal thyroid functional test, CBC, CMP,  UPDATE April 23 2023: He is accompanied by group home staff at today's visit, hospital admission on April 22 for worsening left-sided weakness, confusion suspicious for seizure, Depakote was increased from 500 to 750 mg twice a day  Personally reviewed MRI of the brain April 10, 2023, no acute abnormality, resection cavity with surrounding encephalomalacia gliosis at the right frontal lobe  CT angiogram of head and neck showed no large vessel disease  Laboratory evaluations, normal CBC, hemoglobin of  15.4, BMP, calcium of 8.6  Overnight EEG with video April 17 continues 3 to 6 Hz theta delta slowing at the right frontal region, but no epileptiform discharge  Laboratory evaluations in April 2024 normal TSH RPR B12 folic acid B1, A1c HIV, lamotrigine level was 10, Depakote 80  PHYSICAL EXAM   Vitals:   04/23/23 1548  BP: 118/70  Pulse: 91  Weight: 163 lb (73.9 kg)  Height: 5\' 3"  (1.6 m)   Body mass index is 28.87 kg/m.  PHYSICAL EXAMNIATION:  Gen: NAD, conversant, well nourised, well groomed         NEUROLOGICAL EXAM:  MENTAL STATUS: Speech/cognition: Awake, alert, cooperative on examination,  slurred speech,     CRANIAL NERVES: CN II: Visual fields are full to confrontation. Pupils are round equal and briskly reactive to light. CN III, IV, VI: extraocular movement are normal. No ptosis. CN V: Facial sensation is intact to light touch CN VII: Face is symmetric with normal eye closure  CN VIII: Hearing is normal to causal conversation. CN IX, X: Phonation is normal. CN XI: Head turning and shoulder shrug are intact  MOTOR: Spastic left hemiparesis Antigravity movement of left upper extremity, mild drift of left leg,    REFLEXES: Hyperreflexia on the left side  SENSORY: Intact to light touch, pinprick and vibratory sensation are intact in fingers and toes.  COORDINATION: There is no trunk or limb dysmetria noted.  GAIT/STANCE: Deferred  REVIEW OF SYSTEMS: Full 14 system review of systems performed and notable only for as above All other review of systems were negative.  ALLERGIES: Allergies  Allergen Reactions   Vancomycin Other (See Comments) and Itching    Unknown-possible hives  Unknown-possible hives, Hives.    HOME MEDICATIONS: Current Outpatient Medications  Medication Sig Dispense Refill   Cholecalciferol (VITAMIN D) 50 MCG (2000 UT) tablet Take 1 tablet (2,000 Units total) by mouth daily. 30 tablet 0   divalproex (DEPAKOTE) 250 MG DR tablet Take 1 tablet (250 mg total) by mouth 2 (two) times daily. With 500 mg for a total of 750 mg BID 60 tablet 0   divalproex (DEPAKOTE) 500 MG DR tablet Take 1 tablet (  500 mg total) by mouth every 12 (twelve) hours. Along with 250 mg for a total of 750 mg BID 60 tablet 11   docusate sodium (COLACE) 100 MG capsule Take 1 capsule (100 mg total) by mouth 2 (two) times daily. 10 capsule 0   Emollient (CORN HUSKERS EX) Apply 1 application topically in the morning and at bedtime. For dry skin     feeding supplement (ENSURE ENLIVE / ENSURE PLUS) LIQD Take 237 mLs by mouth 3 (three) times daily between meals. 10000 mL 0    lamoTRIgine (LAMICTAL) 100 MG tablet Take 1 tablet (100 mg total) by mouth at bedtime. 30 tablet 11   levothyroxine (SYNTHROID) 175 MCG tablet Take 175 mcg by mouth daily.     lidocaine (LIDODERM) 5 % Place 1 patch onto the skin daily. Remove & Discard patch within 12 hours or as directed by MD 30 patch 0   metoprolol tartrate (LOPRESSOR) 25 MG tablet Take 1 tablet (25 mg total) by mouth 2 (two) times daily. 180 tablet 3   ondansetron (ZOFRAN) 4 MG tablet Take 4 mg by mouth 4 (four) times daily as needed for nausea or vomiting.     pantoprazole (PROTONIX) 40 MG tablet Take 1 tablet (40 mg total) by mouth 2 (two) times daily before a meal. 60 tablet 0   polyethylene glycol (MIRALAX / GLYCOLAX) 17 g packet Take 17 g by mouth 2 (two) times daily. 14 each 0   QUEtiapine (SEROQUEL) 400 MG tablet Take 1 tablet (400 mg total) by mouth 2 (two) times daily. 60 tablet 0   rosuvastatin (CRESTOR) 20 MG tablet Take 1 tablet (20 mg total) by mouth daily. 30 tablet 0   No current facility-administered medications for this visit.    PAST MEDICAL HISTORY: Past Medical History:  Diagnosis Date   Barrett esophagus    DM II (diabetes mellitus, type II), controlled (HCC) 11/13/2020   Dysphagia    Gastroparesis    GERD (gastroesophageal reflux disease)    Hypercholesteremia    Hypertension    Iron deficiency anemia    Mental retardation    Schizophrenia (HCC)    Seizure (HCC)    Stroke [facility])     PAST SURGICAL HISTORY: Past Surgical History:  Procedure Laterality Date   BIOPSY  10/21/2020   Procedure: BIOPSY;  Surgeon: Iva Boop, MD;  Location: [facility] ENDOSCOPY;  Service: Endoscopy;;   CRANIOTOMY FOR CYST FENESTRATION     ESOPHAGOGASTRODUODENOSCOPY N/A 01/09/2015   Procedure: ESOPHAGOGASTRODUODENOSCOPY (EGD);  Surgeon: Louis Meckel, MD;  Location: [facility] ENDOSCOPY;  Service: Endoscopy;  Laterality: N/A;   ESOPHAGOGASTRODUODENOSCOPY N/A 10/26/2020   Procedure: ESOPHAGOGASTRODUODENOSCOPY (EGD);   Surgeon: Corliss Skains, MD;  Location: [facility] OR;  Service: Thoracic;  Laterality: N/A;   ESOPHAGOGASTRODUODENOSCOPY (EGD) WITH PROPOFOL N/A 10/21/2020   Procedure: ESOPHAGOGASTRODUODENOSCOPY (EGD) WITH PROPOFOL;  Surgeon: Iva Boop, MD;  Location: [facility] ENDOSCOPY;  Service: Endoscopy;  Laterality: N/A;   IR GASTR TUBE CONVERT GASTR-JEJ PER W/FL MOD SED  11/07/2020   PEG PLACEMENT N/A 10/26/2020   Procedure: PERCUTANEOUS ENDOSCOPIC GASTROSTOMY (PEG) PLACEMENT;  Surgeon: Corliss Skains, MD;  Location: [facility] OR;  Service: Thoracic;  Laterality: N/A;   XI ROBOTIC ASSISTED HIATAL HERNIA REPAIR N/A 10/26/2020   Procedure: XI ROBOTIC ASSISTED HIATAL HERNIA REPAIR;  Surgeon: Corliss Skains, MD;  Location: [facility] OR;  Service: Thoracic;  Laterality: N/A;    FAMILY HISTORY: Family History  Problem Relation Age of Onset   Breast cancer Mother  Heart disease Father     SOCIAL HISTORY: Social History   Socioeconomic History   Marital status: Single    Spouse name: Not on file   Number of children: 0   Years of education: 12   Highest education level: High school graduate  Occupational History   Occupation: Disabled  Tobacco Use   Smoking status: Never   Smokeless tobacco: Never  Vaping Use   Vaping Use: Never used  Substance and Sexual Activity   Alcohol use: No   Drug use: Never   Sexual activity: Never  Other Topics Concern   Not on file  Social History Narrative   Lives at [facility] group home (ph: [phone]).   Left-handed.   No daily caffeine use.   Uses walker    18kcal heart healthy diet thin liquid aspiration precautions meds whole in applesauce    Social Determinants of Health   Financial Resource Strain: Not on file  Food Insecurity: No Food Insecurity (04/09/2023)   Hunger Vital Sign    Worried About Running Out of Food in the Last Year: Never true    Ran Out of Food in the Last Year: Never true  Transportation Needs: No Transportation Needs (04/09/2023)    PRAPARE - Administrator, Civil Service (Medical): No    Lack of Transportation (Non-Medical): No  Physical Activity: Not on file  Stress: Not on file  Social Connections: Not on file  Intimate Partner Violence: Not At Risk (04/09/2023)   Humiliation, Afraid, Rape, and Kick questionnaire    Fear of Current or Ex-Partner: No    Emotionally Abused: No    Physically Abused: No    Sexually Abused: No      Levert Feinstein, M.D. Ph.D.  [facility] [address] [place], [place] [address] Ph: [phone] Fax: [phone]  CC:  Lucretia Field [address] [place],  [place] [address]  Katina Dung M

## 2023-04-23 NOTE — Therapy (Signed)
OUTPATIENT PHYSICAL THERAPY NEURO EVALUATION   Patient Name: [name] MRN: [id number] DOB:[birth year], 67 y.o., male Today's Date: 04/25/2023   PCP: Lucretia Field  REFERRING PROVIDER: Rolly Salter, MD  END OF SESSION:  PT End of Session - 04/25/23 1253     Visit Number 1    Number of Visits 7    Date for PT Re-Evaluation 06/06/23    Authorization Type Medicare/Medicaid    PT Start Time 1011    PT Stop Time 1048    PT Time Calculation (min) 37 min    Equipment Utilized During Treatment Gait belt    Activity Tolerance Patient tolerated treatment well;Other (comment)    Behavior During Therapy Flat affect             Past Medical History:  Diagnosis Date   Barrett esophagus    DM II (diabetes mellitus, type II), controlled (HCC) 11/13/2020   Dysphagia    Gastroparesis    GERD (gastroesophageal reflux disease)    Hypercholesteremia    Hypertension    Iron deficiency anemia    Mental retardation    Schizophrenia (HCC)    Seizure (HCC)    Stroke [facility])    Past Surgical History:  Procedure Laterality Date   BIOPSY  10/21/2020   Procedure: BIOPSY;  Surgeon: Iva Boop, MD;  Location: [facility] ENDOSCOPY;  Service: Endoscopy;;   CRANIOTOMY FOR CYST FENESTRATION     ESOPHAGOGASTRODUODENOSCOPY N/A 01/09/2015   Procedure: ESOPHAGOGASTRODUODENOSCOPY (EGD);  Surgeon: Louis Meckel, MD;  Location: [facility] ENDOSCOPY;  Service: Endoscopy;  Laterality: N/A;   ESOPHAGOGASTRODUODENOSCOPY N/A 10/26/2020   Procedure: ESOPHAGOGASTRODUODENOSCOPY (EGD);  Surgeon: Corliss Skains, MD;  Location: [facility] OR;  Service: Thoracic;  Laterality: N/A;   ESOPHAGOGASTRODUODENOSCOPY (EGD) WITH PROPOFOL N/A 10/21/2020   Procedure: ESOPHAGOGASTRODUODENOSCOPY (EGD) WITH PROPOFOL;  Surgeon: Iva Boop, MD;  Location: [facility] ENDOSCOPY;  Service: Endoscopy;  Laterality: N/A;   IR GASTR TUBE CONVERT GASTR-JEJ PER W/FL MOD SED  11/07/2020   PEG PLACEMENT N/A 10/26/2020   Procedure:  PERCUTANEOUS ENDOSCOPIC GASTROSTOMY (PEG) PLACEMENT;  Surgeon: Corliss Skains, MD;  Location: [facility] OR;  Service: Thoracic;  Laterality: N/A;   XI ROBOTIC ASSISTED HIATAL HERNIA REPAIR N/A 10/26/2020   Procedure: XI ROBOTIC ASSISTED HIATAL HERNIA REPAIR;  Surgeon: Corliss Skains, MD;  Location: [facility] OR;  Service: Thoracic;  Laterality: N/A;   Patient Active Problem List   Diagnosis Date Noted   Breakthrough seizure (HCC) 04/11/2023   General weakness 04/09/2023   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) 10/26/2021   Seizure disorder (HCC) 10/26/2021   S/P craniotomy 03/09/2021   Dilation of esophagus    Hyponatremia    Dysphagia    Seizures (HCC)    S/P percutaneous endoscopic gastrostomy (PEG) tube placement (HCC)    Weakness 11/15/2020   DM II (diabetes mellitus, type II), controlled (HCC) 11/13/2020   Bowel obstruction (HCC)    S/P repair of paraesophageal hernia 10/26/2020   Syncope 10/21/2020   Hiatal hernia 10/21/2020   Acute hypoxemic respiratory failure (HCC) 10/21/2020   Sinus tachycardia 10/21/2020   Gastric polyps    Altered mental status 09/05/2020   Emesis 07/24/2015   Coffee ground emesis    Gastrointestinal hemorrhage associated with other gastritis    Acute esophagitis 01/09/2015   Upper gastrointestinal bleed    Diarrhea    Nausea with vomiting    GI bleed 01/08/2015   GIB (gastrointestinal bleeding) 01/08/2015  Nausea & vomiting 01/08/2015   Reflux esophagitis 07/04/2009   Loss of weight 07/04/2009   BLOOD IN STOOL, OCCULT 07/04/2009   ANEMIA, IRON DEFICIENCY 05/04/2008   Hypothyroidism 04/05/2008   Schizophrenia, unspecified type (HCC) 04/05/2008   Depression 04/05/2008   Hemiplegia (HCC) 04/05/2008   Essential hypertension 04/05/2008   GERD 04/05/2008   Gastroparesis 04/05/2008   Seizure (HCC) 04/05/2008   History of diverticulosis 08/21/2004   Barrett's esophagus 04/01/2002   HIATAL HERNIA  04/01/2002    ONSET DATE: 04/09/23  REFERRING DIAG: R53.1 (ICD-10-CM) - Left-sided weakness G40.919 (ICD-10-CM) - Breakthrough seizure (HCC)  THERAPY DIAG:  Muscle weakness (generalized)  Unsteadiness on feet  Difficulty in walking, not elsewhere classified  Rationale for Evaluation and Treatment: Rehabilitation  SUBJECTIVE:                                                                                                                                                                                             SUBJECTIVE STATEMENT: Patient reports that he goes by [facility] and caregiver's name is [name] (Upon speaking to caregiver, his name is [name]). Patient denies falls and denies concerns about balance.  Reports that the L arm does not hurt but it is weak.   Pt accompanied by: self  PERTINENT HISTORY: DMII, GERD, HLD, HTN, anemia, schizophrenia, seizure, stroke with L hemiparesis, benign intracranial tumor s/p craniotomy 2004, dysphagia, cognitive deficit   PAIN:  Are you having pain? No  PRECAUTIONS: Fall and Other: seizures  WEIGHT BEARING RESTRICTIONS: No  FALLS: Has patient fallen in last 6 months? No  LIVING ENVIRONMENT: Per OT's note as caregiver not present for PT session:  Lives with: lives in an adult home Lives in: House/apartment Stairs: No Has following equipment at home: Dan Humphreys - 4 wheeled, Wheelchair (power), shower chair, and Grab bars   PLOF:  Mod I for toileting, but Min-mod assist for ADLs.  Utilized Rollator for mobility prior to most recent hospitalization  PATIENT GOALS: pt agrees that he wants to work on moving better   OBJECTIVE:   DIAGNOSTIC FINDINGS: 04/10/23 brain MRI: No acute intracranial process. Unchanged resection cavity with surrounding encephalomalacia and gliosis in the right frontal lobe.  COGNITION: Overall cognitive status: History of cognitive impairments - at baseline   SENSATION: Intact in B Les to light  touch  COORDINATION:  Alternating toe tap: pt lifting R and L LE with hip flexors rather than DF; limited motion on L  MUSCLE TONE: appears intact in L LE but limited by cognition/understanding on R LE d/t tendency to try to perform AROM  POSTURE: posterior pelvic tilt and rounded spine  in w/c, head bend downward and to the R  LOWER EXTREMITY MMT:     MMT Right Eval Left Eval  Hip flexion 4 3  Hip extension    Hip abduction 4 2  Hip adduction 4 2  Hip internal rotation    Hip external rotation    Knee flexion 4+ 1  Knee extension 4+ 4-  Ankle dorsiflexion 4 0 (unable to elicit contraction but unsure if limited by cognition)  Ankle plantarflexion 4 0  (unable to elicit contraction but unsure if limited by cognition)  Ankle inversion    Ankle eversion     (Blank rows = not tested)  LOWER EXTREMITY MMT:    AROM Right Eval Left Eval  Hip flexion    Hip extension    Hip abduction    Hip adduction    Hip internal rotation    Hip external rotation    Knee flexion    Knee extension    Ankle dorsiflexion 6 -15  Ankle plantarflexion    Ankle inversion    Ankle eversion    (Blank rows = not tested)  TRANSFERS: Assistive device utilized: Environmental consultant - 2 wheeled  Sit to stand: Min A and x2 Stand to sit: Min A and x2 Pt able to perform marching with R LE but unable to move L LE; required assist in managing L LE on RW   GAIT: Gait pattern:  pt unable today Trialed self-w/c propulsion with R UE but with limited ability and path deviation     TODAY'S TREATMENT:                                                                                                                              DATE: 5/2    PATIENT EDUCATION: Education details: prognosis, POC, HEP- with caregiver assist Person educated: Patient and Caregiver [name] Education method: Explanation, Demonstration, Tactile cues, Verbal cues, and Handouts Education comprehension: verbalized understanding  HOME  EXERCISE PROGRAM: Access Code: [email or web address] URL: [email or web address] Date: 04/25/2023 Prepared by: [facility] - Outpatient  Rehab - [facility]  Exercises - Seated March  - 1 x daily - 5 x weekly - 2 sets - 10 reps - Seated Long Arc Quad  - 1 x daily - 5 x weekly - 2 sets - 10 reps - Seated Hip Adduction Isometrics with Ball  - 1 x daily - 5 x weekly - 2 sets - 10 reps - 3 sec hold - Sit to Stand with Counter Support  - 1 x daily - 5 x weekly - 2 sets - 10 reps  GOALS: Goals reviewed with patient? Yes  SHORT TERM GOALS: Target date: 05/16/2023  Patient to perform initial HEP with caregiver assist. Baseline: HEP initiated Goal status: INITIAL    LONG TERM GOALS: Target date: 06/06/2023  Patient to perform advanced HEP with caregiver assist. Baseline: Not yet initiated  Goal status: INITIAL  Patient to demonstrate L LE strength >/=3/5.  Baseline: See above Goal status: INITIAL  Patient to demonstrate improved head, pelvic, and spinal position in w/c,  Baseline: rounded posture, head drooping down Goal status: INITIAL  Patient to perform sit to stand transfer with supervision and LRAD.   Baseline: min A x 2 with RW Goal status: INITIAL  Patient to ambulate 30 feet with LRAD and CGA.  Baseline: unable Goal status: INITIAL   ASSESSMENT:  CLINICAL IMPRESSION:  Patient is a 67 y/o M presenting to OPPT with s/p hospitalization 04/09/23-04/15/23 for seizures. Of note, patient also with chronic L hemiparesis. Patient today presenting with rounded posture, marked L LE weakness, limited L ankle AROM, assistance required for transfers, and inability to ambulate. Assessment today somewhat limited by cognition. Patient and caregiver were educated on gentle HEP and reported understanding. Prior to current episode, patient was ambulating household distances with RW. Would benefit from skilled PT services 1 x/week for 6 weeks to address aforementioned impairments in order  to optimize level of function.    OBJECTIVE IMPAIRMENTS: Abnormal gait, decreased activity tolerance, decreased balance, decreased mobility, difficulty walking, decreased ROM, decreased strength, impaired flexibility, improper body mechanics, and postural dysfunction.   ACTIVITY LIMITATIONS: carrying, lifting, bending, sitting, standing, squatting, stairs, transfers, bed mobility, bathing, toileting, dressing, reach over head, hygiene/grooming, and locomotion level  PARTICIPATION LIMITATIONS: community activity and church  PERSONAL FACTORS: Age, Behavior pattern, Past/current experiences, Time since onset of injury/illness/exacerbation, Transportation, and 3+ comorbidities: DMII, GERD, HLD, HTN, anemia, schizophrenia, seizure, stroke with L hemiparesis, benign intracranial tumor s/p craniotomy 2004, dysphagia  are also affecting patient's functional outcome.   REHAB POTENTIAL: Good  CLINICAL DECISION MAKING: Evolving/moderate complexity  EVALUATION COMPLEXITY: Moderate  PLAN:  PT FREQUENCY: 1x/week  PT DURATION: 6 weeks  PLANNED INTERVENTIONS: Therapeutic exercises, Therapeutic activity, Neuromuscular re-education, Balance training, Gait training, Patient/Family education, Self Care, Joint mobilization, Stair training, Vestibular training, Canalith repositioning, Orthotic/Fit training, DME instructions, Aquatic Therapy, Dry Needling, Electrical stimulation, Cryotherapy, Moist heat, Taping, Manual therapy, and Re-evaluation  PLAN FOR NEXT SESSION: STS transfers at [facility] or II bars, trial standing marching, wt shifting and encourage use of L LE   Anette Guarneri, PT, DPT 04/25/23 12:55 PM  [facility] at [facility] [address] [place], [place] [address] Phone # [phone] Fax # [phone]

## 2023-04-23 NOTE — Patient Instructions (Signed)
Meds ordered this encounter  Medications   divalproex (DEPAKOTE SPRINKLE) 125 MG capsule    Sig: Take 6 capsules (750 mg total) by mouth 2 (two) times daily.    Dispense:  370 capsule    Refill:  11

## 2023-04-24 ENCOUNTER — Other Ambulatory Visit: Payer: Self-pay

## 2023-04-24 MED ORDER — LAMOTRIGINE 100 MG PO TABS: 100.0000 mg | ORAL_TABLET | Freq: Every day | ORAL | 3 refills | Status: DC

## 2023-04-24 NOTE — Addendum Note (Signed)
Addended by: Danne Harbor on: 04/24/2023 10:55 AM   Modules accepted: Orders

## 2023-04-25 ENCOUNTER — Ambulatory Visit: Payer: Medicare Other | Attending: Internal Medicine | Admitting: Occupational Therapy

## 2023-04-25 ENCOUNTER — Ambulatory Visit: Payer: Medicare Other | Admitting: Physical Therapy

## 2023-04-25 ENCOUNTER — Other Ambulatory Visit: Payer: Self-pay

## 2023-04-25 DIAGNOSIS — M6281 Muscle weakness (generalized): Secondary | ICD-10-CM

## 2023-04-25 DIAGNOSIS — G40919 Epilepsy, unspecified, intractable, without status epilepticus: Secondary | ICD-10-CM | POA: Insufficient documentation

## 2023-04-25 DIAGNOSIS — R208 Other disturbances of skin sensation: Secondary | ICD-10-CM | POA: Diagnosis present

## 2023-04-25 DIAGNOSIS — R41840 Attention and concentration deficit: Secondary | ICD-10-CM | POA: Diagnosis present

## 2023-04-25 DIAGNOSIS — R262 Difficulty in walking, not elsewhere classified: Secondary | ICD-10-CM

## 2023-04-25 DIAGNOSIS — R2681 Unsteadiness on feet: Secondary | ICD-10-CM | POA: Diagnosis present

## 2023-04-25 DIAGNOSIS — I69354 Hemiplegia and hemiparesis following cerebral infarction affecting left non-dominant side: Secondary | ICD-10-CM | POA: Diagnosis present

## 2023-04-25 DIAGNOSIS — R531 Weakness: Secondary | ICD-10-CM | POA: Diagnosis not present

## 2023-04-25 DIAGNOSIS — R2689 Other abnormalities of gait and mobility: Secondary | ICD-10-CM

## 2023-04-25 DIAGNOSIS — R278 Other lack of coordination: Secondary | ICD-10-CM | POA: Diagnosis present

## 2023-04-25 DIAGNOSIS — R293 Abnormal posture: Secondary | ICD-10-CM | POA: Diagnosis present

## 2023-04-25 NOTE — Therapy (Signed)
OUTPATIENT OCCUPATIONAL THERAPY NEURO EVALUATION  Patient Name: [name] MRN: [id number] DOB:[birth year], 67 y.o., male Today's Date: 04/25/2023  PCP: Katina Dung, Judie Petit REFERRING PROVIDER: Rolly Salter, MD  END OF SESSION:  OT End of Session - 04/25/23 1514     Visit Number 1    Number of Visits 7    Date for OT Re-Evaluation 06/07/23    Authorization Type Medicare A&B    OT Start Time 0930    OT Stop Time 1012    OT Time Calculation (min) 42 min    Behavior During Therapy Flat affect             Past Medical History:  Diagnosis Date   Barrett esophagus    DM II (diabetes mellitus, type II), controlled (HCC) 11/13/2020   Dysphagia    Gastroparesis    GERD (gastroesophageal reflux disease)    Hypercholesteremia    Hypertension    Iron deficiency anemia    Mental retardation    Schizophrenia (HCC)    Seizure (HCC)    Stroke [facility])    Past Surgical History:  Procedure Laterality Date   BIOPSY  10/21/2020   Procedure: BIOPSY;  Surgeon: Iva Boop, MD;  Location: [facility] ENDOSCOPY;  Service: Endoscopy;;   CRANIOTOMY FOR CYST FENESTRATION     ESOPHAGOGASTRODUODENOSCOPY N/A 01/09/2015   Procedure: ESOPHAGOGASTRODUODENOSCOPY (EGD);  Surgeon: Louis Meckel, MD;  Location: [facility] ENDOSCOPY;  Service: Endoscopy;  Laterality: N/A;   ESOPHAGOGASTRODUODENOSCOPY N/A 10/26/2020   Procedure: ESOPHAGOGASTRODUODENOSCOPY (EGD);  Surgeon: Corliss Skains, MD;  Location: [facility] OR;  Service: Thoracic;  Laterality: N/A;   ESOPHAGOGASTRODUODENOSCOPY (EGD) WITH PROPOFOL N/A 10/21/2020   Procedure: ESOPHAGOGASTRODUODENOSCOPY (EGD) WITH PROPOFOL;  Surgeon: Iva Boop, MD;  Location: [facility] ENDOSCOPY;  Service: Endoscopy;  Laterality: N/A;   IR GASTR TUBE CONVERT GASTR-JEJ PER W/FL MOD SED  11/07/2020   PEG PLACEMENT N/A 10/26/2020   Procedure: PERCUTANEOUS ENDOSCOPIC GASTROSTOMY (PEG) PLACEMENT;  Surgeon: Corliss Skains, MD;  Location: [facility] OR;  Service: Thoracic;   Laterality: N/A;   XI ROBOTIC ASSISTED HIATAL HERNIA REPAIR N/A 10/26/2020   Procedure: XI ROBOTIC ASSISTED HIATAL HERNIA REPAIR;  Surgeon: Corliss Skains, MD;  Location: [facility] OR;  Service: Thoracic;  Laterality: N/A;   Patient Active Problem List   Diagnosis Date Noted   Breakthrough seizure (HCC) 04/11/2023   General weakness 04/09/2023   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) 10/26/2021   Seizure disorder (HCC) 10/26/2021   S/P craniotomy 03/09/2021   Dilation of esophagus    Hyponatremia    Dysphagia    Seizures (HCC)    S/P percutaneous endoscopic gastrostomy (PEG) tube placement (HCC)    Weakness 11/15/2020   DM II (diabetes mellitus, type II), controlled (HCC) 11/13/2020   Bowel obstruction (HCC)    S/P repair of paraesophageal hernia 10/26/2020   Syncope 10/21/2020   Hiatal hernia 10/21/2020   Acute hypoxemic respiratory failure (HCC) 10/21/2020   Sinus tachycardia 10/21/2020   Gastric polyps    Altered mental status 09/05/2020   Emesis 07/24/2015   Coffee ground emesis    Gastrointestinal hemorrhage associated with other gastritis    Acute esophagitis 01/09/2015   Upper gastrointestinal bleed    Diarrhea    Nausea with vomiting    GI bleed 01/08/2015   GIB (gastrointestinal bleeding) 01/08/2015   Nausea & vomiting 01/08/2015   Reflux esophagitis 07/04/2009   Loss of weight 07/04/2009   BLOOD IN  STOOL, OCCULT 07/04/2009   ANEMIA, IRON DEFICIENCY 05/04/2008   Hypothyroidism 04/05/2008   Schizophrenia, unspecified type (HCC) 04/05/2008   Depression 04/05/2008   Hemiplegia (HCC) 04/05/2008   Essential hypertension 04/05/2008   GERD 04/05/2008   Gastroparesis 04/05/2008   Seizure (HCC) 04/05/2008   History of diverticulosis 08/21/2004   Barrett's esophagus 04/01/2002   HIATAL HERNIA 04/01/2002    ONSET DATE: 04/09/23  REFERRING DIAG: R53.1 (ICD-10-CM) - Left-sided weakness G40.919 (ICD-10-CM) -  Breakthrough seizure  THERAPY DIAG:  Hemiplegia and hemiparesis following cerebral infarction affecting left non-dominant side (HCC)  Muscle weakness (generalized)  Attention and concentration deficit  Abnormal posture  Other abnormalities of gait and mobility  Other disturbances of skin sensation  Other lack of coordination  Rationale for Evaluation and Treatment: Rehabilitation  SUBJECTIVE:   SUBJECTIVE STATEMENT: Pt states that his LUE is weak.  Pt reports that he uses a walker and w/c for mobility.  Pt with inconsistent report on PLOF and current status, therefore caregiver present for portion of session to address current status and PLOF.  Pt's caregiver reports that pt's strength is weaker on the L side and that his cognition is different.  Caregiver reports that pt is w/c level since hospitalization and requires +2 for all transfers and self-care tasks. 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 No  FALLS: Has patient fallen in last 6 months? No  LIVING ENVIRONMENT: Lives with: lives in an adult home Lives in: House/apartment Stairs: No Has following equipment at home: Dan Humphreys - 4 wheeled, Wheelchair (power), shower chair, and Grab bars  PLOF:  Mod I for toileting, but Min-mod assist for ADLs.  Utilized Rollator for mobility prior to most recent hospitalization  PATIENT GOALS: to have a long life  OBJECTIVE:   HAND DOMINANCE: Right  ADLs: Overall ADLs: +2 assist for all mobility and self-care tasks. +2 for transfers into shower, utilizing shower chair Transfers/ambulation related to ADLs: +2 assist Eating: setup to open  containers, but then pt able to feed himself Grooming: total assist UB Dressing: total assist LB Dressing: +2 assist Toileting: +2 assist Bathing: +2 assist Tub Shower transfers: +2 assist, handicap accessible shower with seat Equipment: Shower seat with back and Walk in shower   MOBILITY STATUS:  utilizing w/c at this point for mobility  POSTURE COMMENTS:  rounded shoulders, forward head, and posterior pelvic tilt   UPPER EXTREMITY ROM:  AROM: Pt initially able to lift arm ~20-30% from arm rest, utilizing abduction to achieve ~60* shoulder flexion; however when directed to move to assess ROM pt stating no.  OT attempted to explain purpose of therapy and evaluation to assess ROM, however pt continuing to shake head and state no to further attempts at AROM.  Did require max encouragement to assess PROM.  Passive ROM Right eval Left eval  Shoulder flexion  30*  Shoulder abduction  80*  Shoulder adduction    Shoulder extension    Shoulder internal rotation    Shoulder external rotation    Elbow flexion  WNL  Elbow extension  WNL  Wrist flexion  50%  Wrist extension  25%  Wrist ulnar deviation    Wrist radial deviation    Wrist pronation  Wrist supination    (Blank rows = not tested)   HAND FUNCTION: Unable to assess as pt keeping hand in partial fist.  Pt is able to open digits fully, except for thumb, with increased time and demonstration  COORDINATION: Pt would not attempt to pick any items up with L hand despite multiple attempts and multiple items  SENSATION: Inconsistent report.  Pt reports both hands feel the same, but also reports numbness on L side since stroke   COGNITION: Overall cognitive status: History of cognitive impairments - at baseline  VISION: Subjective report: unable to assess due to inconsistent report, cognition impacting ability to attend and participate   OBSERVATIONS: Pt with inconsistent report and frequently shaking head when therapist  encouraging attempts at AROM for LUE.  Pt did lift LUE spontaneously when attempting to reach towards RW during PT session (OT present as +2 initially).     TODAY'S TREATMENT:                                                                       04/25/23 NA, eval only  PATIENT EDUCATION: Education details: Educated on role and purpose of OT as well as potential interventions and goals for therapy based on initial evaluation findings. Person educated: Patient Education method: Explanation Education comprehension: needs further education  HOME EXERCISE PROGRAM: TBD   GOALS: Goals reviewed with patient? No  SHORT TERM GOALS: Target date: 05/17/23  Pt and caregiver will be independent with simple HEP and functional activities to encourage simple assist from LUE and to increase awareness/attention of LUE. Baseline: Goal status: INITIAL  2.  Pt will tolerate standing with supervision to allow caregiver to complete clothing management/hygiene for toileting. Baseline:  Goal status: INITIAL  3.  Pt will be able to complete UB dressing with min assist and min cues for hemi-technique. Baseline:  Goal status: INITIAL   LONG TERM GOALS: Target date: 06/07/23  Pt will complete toilet transfer, stand pivot, with min assist of one caregiver. Baseline:  Goal status: INITIAL  2.  Pt will assist with bathing to complete 50%of task, incorporating use of LUE at gross assist level and with use of AE prn  Baseline:  Goal status: INITIAL  3.  Pt will complete clothing management post toileting with min assist of one caregiver for safety and sequencing. Baseline:  Goal status: INITIAL  4.  Pt will be able to complete LB dressing with mod assist and min cues for hemi-technique. Baseline:  Goal status: INITIAL  ASSESSMENT:  CLINICAL IMPRESSION: Patient is a 67 y.o. male who was seen today for occupational therapy evaluation for impairments s/p breakthrough seizures impacting PLOF with ADLs.   Pt currently lives in a group home, requires +2 for transfers and ADLs and is primarily w/c level s/p recent hospitalization.  Pt with inconsistent attention to tasks, question worsening of cognition and awareness vs motivation to engage impacting ability to fully assess all mobility. Pt will benefit from skilled occupational therapy services to address strength and coordination, ROM, pain management, altered sensation, balance, cognition, safety awareness, introduction of compensatory strategies/AE prn, and implementation of an HEP to improve participation and safety during ADLs.    PERFORMANCE DEFICITS: in functional skills including ADLs, coordination, sensation, ROM,  strength, flexibility, Fine motor control, Gross motor control, balance, body mechanics, endurance, decreased knowledge of use of DME, and UE functional use, cognitive skills including attention, safety awareness, sequencing, and understand, and psychosocial skills including environmental adaptation.   IMPAIRMENTS: are limiting patient from ADLs.   CO-MORBIDITIES: may have co-morbidities  that affects occupational performance. Patient will benefit from skilled OT to address above impairments and improve overall function.  MODIFICATION OR ASSISTANCE TO COMPLETE EVALUATION: Min-Moderate modification of tasks or assist with assess necessary to complete an evaluation.  OT OCCUPATIONAL PROFILE AND HISTORY: Detailed assessment: Review of records and additional review of physical, cognitive, psychosocial history related to current functional performance.  CLINICAL DECISION MAKING: Moderate - several treatment options, min-mod task modification necessary  REHAB POTENTIAL: Good  EVALUATION COMPLEXITY: Moderate    PLAN:  OT FREQUENCY: 1x/week  OT DURATION: 6 weeks  PLANNED INTERVENTIONS: self care/ADL training, therapeutic exercise, therapeutic activity, neuromuscular re-education, manual therapy, passive range of motion, balance  training, functional mobility training, splinting, ultrasound, paraffin, fluidotherapy, compression bandaging, moist heat, cryotherapy, patient/family education, cognitive remediation/compensation, visual/perceptual remediation/compensation, psychosocial skills training, coping strategies training, and DME and/or AE instructions  RECOMMENDED OTHER SERVICES: SLP eval  CONSULTED AND AGREED WITH PLAN OF CARE: Patient  PLAN FOR NEXT SESSION: Initiate HEP for self-ROM and caregiver PROM for LUE, sit > stand, transfers to decrease burden of care with toileting and LB dressing.   Rosalio Loud, OTR/L 04/25/2023, 3:17 PM

## 2023-04-30 NOTE — Therapy (Signed)
OUTPATIENT PHYSICAL THERAPY NEURO TREATMENT   Patient Name: [name] MRN: [id number] DOB:1955/12/28, 67 y.o., male Today's Date: 05/01/2023   PCP: Lucretia Field  REFERRING PROVIDER: Rolly Salter, MD  END OF SESSION:  PT End of Session - 05/01/23 1144     Visit Number 2    Number of Visits 7    Date for PT Re-Evaluation 06/06/23    Authorization Type Medicare/Medicaid    PT Start Time 1100    PT Stop Time 1141   pt in bathroom during session   PT Time Calculation (min) 41 min    Equipment Utilized During Treatment Gait belt    Activity Tolerance Patient tolerated treatment well    Behavior During Therapy Flat affect              Past Medical History:  Diagnosis Date   Barrett esophagus    DM II (diabetes mellitus, type II), controlled (HCC) 11/13/2020   Dysphagia    Gastroparesis    GERD (gastroesophageal reflux disease)    Hypercholesteremia    Hypertension    Iron deficiency anemia    Mental retardation    Schizophrenia (HCC)    Seizure (HCC)    Stroke [facility])    Past Surgical History:  Procedure Laterality Date   BIOPSY  10/21/2020   Procedure: BIOPSY;  Surgeon: Iva Boop, MD;  Location: [facility] ENDOSCOPY;  Service: Endoscopy;;   CRANIOTOMY FOR CYST FENESTRATION     ESOPHAGOGASTRODUODENOSCOPY N/A 01/09/2015   Procedure: ESOPHAGOGASTRODUODENOSCOPY (EGD);  Surgeon: Louis Meckel, MD;  Location: [facility] ENDOSCOPY;  Service: Endoscopy;  Laterality: N/A;   ESOPHAGOGASTRODUODENOSCOPY N/A 10/26/2020   Procedure: ESOPHAGOGASTRODUODENOSCOPY (EGD);  Surgeon: Corliss Skains, MD;  Location: [facility] OR;  Service: Thoracic;  Laterality: N/A;   ESOPHAGOGASTRODUODENOSCOPY (EGD) WITH PROPOFOL N/A 10/21/2020   Procedure: ESOPHAGOGASTRODUODENOSCOPY (EGD) WITH PROPOFOL;  Surgeon: Iva Boop, MD;  Location: [facility] ENDOSCOPY;  Service: Endoscopy;  Laterality: N/A;   IR GASTR TUBE CONVERT GASTR-JEJ PER W/FL MOD SED  11/07/2020   PEG PLACEMENT N/A 10/26/2020    Procedure: PERCUTANEOUS ENDOSCOPIC GASTROSTOMY (PEG) PLACEMENT;  Surgeon: Corliss Skains, MD;  Location: [facility] OR;  Service: Thoracic;  Laterality: N/A;   XI ROBOTIC ASSISTED HIATAL HERNIA REPAIR N/A 10/26/2020   Procedure: XI ROBOTIC ASSISTED HIATAL HERNIA REPAIR;  Surgeon: Corliss Skains, MD;  Location: [facility] OR;  Service: Thoracic;  Laterality: N/A;   Patient Active Problem List   Diagnosis Date Noted   Breakthrough seizure (HCC) 04/11/2023   General weakness 04/09/2023   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) 10/26/2021   Seizure disorder (HCC) 10/26/2021   S/P craniotomy 03/09/2021   Dilation of esophagus    Hyponatremia    Dysphagia    Seizures (HCC)    S/P percutaneous endoscopic gastrostomy (PEG) tube placement (HCC)    Weakness 11/15/2020   DM II (diabetes mellitus, type II), controlled (HCC) 11/13/2020   Bowel obstruction (HCC)    S/P repair of paraesophageal hernia 10/26/2020   Syncope 10/21/2020   Hiatal hernia 10/21/2020   Acute hypoxemic respiratory failure (HCC) 10/21/2020   Sinus tachycardia 10/21/2020   Gastric polyps    Altered mental status 09/05/2020   Emesis 07/24/2015   Coffee ground emesis    Gastrointestinal hemorrhage associated with other gastritis    Acute esophagitis 01/09/2015   Upper gastrointestinal bleed    Diarrhea    Nausea with vomiting    GI bleed 01/08/2015  GIB (gastrointestinal bleeding) 01/08/2015   Nausea & vomiting 01/08/2015   Reflux esophagitis 07/04/2009   Loss of weight 07/04/2009   BLOOD IN STOOL, OCCULT 07/04/2009   ANEMIA, IRON DEFICIENCY 05/04/2008   Hypothyroidism 04/05/2008   Schizophrenia, unspecified type (HCC) 04/05/2008   Depression 04/05/2008   Hemiplegia (HCC) 04/05/2008   Essential hypertension 04/05/2008   GERD 04/05/2008   Gastroparesis 04/05/2008   Seizure (HCC) 04/05/2008   History of diverticulosis 08/21/2004   Barrett's esophagus 04/01/2002    HIATAL HERNIA 04/01/2002    ONSET DATE: 04/09/23  REFERRING DIAG: R53.1 (ICD-10-CM) - Left-sided weakness G40.919 (ICD-10-CM) - Breakthrough seizure (HCC)  THERAPY DIAG:  Muscle weakness (generalized)  Unsteadiness on feet  Difficulty in walking, not elsewhere classified  Rationale for Evaluation and Treatment: Rehabilitation  SUBJECTIVE:                                                                                                                                                                                             SUBJECTIVE STATEMENT: Doing good- no pain. Caregiver denies that patient has any aspiration precautions and is ok for thin liquids PO.   Pt accompanied by: self  PERTINENT HISTORY: DMII, GERD, HLD, HTN, anemia, schizophrenia, seizure, stroke with L hemiparesis, benign intracranial tumor s/p craniotomy 2004, dysphagia, cognitive deficit   PAIN:  Are you having pain? No  PRECAUTIONS: Fall and Other: seizures  WEIGHT BEARING RESTRICTIONS: No  FALLS: Has patient fallen in last 6 months? No  LIVING ENVIRONMENT: Per OT's note as caregiver not present for PT session:  Lives with: lives in an adult home Lives in: House/apartment Stairs: No Has following equipment at home: Dan Humphreys - 4 wheeled, Wheelchair (power), shower chair, and Grab bars   PLOF:  Mod I for toileting, but Min-mod assist for ADLs.  Utilized Rollator for mobility prior to most recent hospitalization  PATIENT GOALS: pt agrees that he wants to work on moving better   OBJECTIVE:      TODAY'S TREATMENT: 05/01/23 Activity Comments  review of HEP: sitting march x20 LAQ 15x  sitting ball squeeze 10x3" Manual cueing to evoke participation of L LE and verbal cueing for "R leg, L leg." Initially compensating with hip flexors to perform LAQ, which improved with manual cueing to keep thigh down. Difficulty d/t possible tone to flex knee back down.   Sitting ball toss Some use of L hand; for trunk  strength and sitting balance   STS transfers with II in front B hand pulling up to stand at II bar with CGA-min A; pt attempted to take steps and PT had to direct static standing  d/t safety. 3 trials: 1st 10 sec, 2nd 30 sec, 3rd 2-3 min  standing marching at II bar Poor control of B UEs; tendency to step with L rather than bend L knee;        HOME EXERCISE PROGRAM: Access Code: [email or web address] URL: [email or web address] Date: 04/25/2023 Prepared by: [facility] - Outpatient  Rehab - [facility]  Exercises - Seated March  - 1 x daily - 5 x weekly - 2 sets - 10 reps - Seated Long Arc Quad  - 1 x daily - 5 x weekly - 2 sets - 10 reps - Seated Hip Adduction Isometrics with Ball  - 1 x daily - 5 x weekly - 2 sets - 10 reps - 3 sec hold - Sit to Stand with Counter Support  - 1 x daily - 5 x weekly - 2 sets - 10 reps    Below measures were taken at time of initial evaluation unless otherwise specified:   DIAGNOSTIC FINDINGS: 04/10/23 brain MRI: No acute intracranial process. Unchanged resection cavity with surrounding encephalomalacia and gliosis in the right frontal lobe.  COGNITION: Overall cognitive status: History of cognitive impairments - at baseline   SENSATION: Intact in B Les to light touch  COORDINATION:  Alternating toe tap: pt lifting R and L LE with hip flexors rather than DF; limited motion on L  MUSCLE TONE: appears intact in L LE but limited by cognition/understanding on R LE d/t tendency to try to perform AROM  POSTURE: posterior pelvic tilt and rounded spine in w/c, head bend downward and to the R  LOWER EXTREMITY MMT:     MMT Right Eval Left Eval  Hip flexion 4 3  Hip extension    Hip abduction 4 2  Hip adduction 4 2  Hip internal rotation    Hip external rotation    Knee flexion 4+ 1  Knee extension 4+ 4-  Ankle dorsiflexion 4 0 (unable to elicit contraction but unsure if limited by cognition)  Ankle plantarflexion 4 0  (unable to elicit  contraction but unsure if limited by cognition)  Ankle inversion    Ankle eversion     (Blank rows = not tested)  LOWER EXTREMITY MMT:    AROM Right Eval Left Eval  Hip flexion    Hip extension    Hip abduction    Hip adduction    Hip internal rotation    Hip external rotation    Knee flexion    Knee extension    Ankle dorsiflexion 6 -15  Ankle plantarflexion    Ankle inversion    Ankle eversion    (Blank rows = not tested)  TRANSFERS: Assistive device utilized: Environmental consultant - 2 wheeled  Sit to stand: Min A and x2 Stand to sit: Min A and x2 Pt able to perform marching with R LE but unable to move L LE; required assist in managing L LE on RW   GAIT: Gait pattern:  pt unable today Trialed self-w/c propulsion with R UE but with limited ability and path deviation     TODAY'S TREATMENT:  DATE: 5/2    PATIENT EDUCATION: Education details: prognosis, POC, HEP- with caregiver assist Person educated: Patient and Caregiver Greig Castilla Education method: Explanation, Demonstration, Tactile cues, Verbal cues, and Handouts Education comprehension: verbalized understanding  HOME EXERCISE PROGRAM: Access Code: [email or web address] URL: [email or web address] Date: 04/25/2023 Prepared by: [facility] - Outpatient  Rehab - [facility]  Exercises - Seated March  - 1 x daily - 5 x weekly - 2 sets - 10 reps - Seated Long Arc Quad  - 1 x daily - 5 x weekly - 2 sets - 10 reps - Seated Hip Adduction Isometrics with Ball  - 1 x daily - 5 x weekly - 2 sets - 10 reps - 3 sec hold - Sit to Stand with Counter Support  - 1 x daily - 5 x weekly - 2 sets - 10 reps  GOALS: Goals reviewed with patient? Yes  SHORT TERM GOALS: Target date: 05/16/2023  Patient to perform initial HEP with caregiver assist. Baseline: HEP initiated Goal status: IN PROGRESS    LONG TERM  GOALS: Target date: 06/06/2023  Patient to perform advanced HEP with caregiver assist. Baseline: Not yet initiated  Goal status: IN PROGRESS  Patient to demonstrate L LE strength >/=3/5.  Baseline: See above Goal status: IN PROGRESS  Patient to demonstrate improved head, pelvic, and spinal position in w/c,  Baseline: rounded posture, head drooping down Goal status: IN PROGRESS  Patient to perform sit to stand transfer with supervision and LRAD.   Baseline: min A x 2 with RW Goal status: IN PROGRESS  Patient to ambulate 30 feet with LRAD and CGA.  Baseline: unable Goal status: IN PROGRESS   ASSESSMENT:  CLINICAL IMPRESSION: Patient arrived to session with caregiver without complaints. Reviewed HEP which required heavy verbal and manual cueing for proper movement and muscle activation. Patient seemingly with more energy today and demonstrated more effort with exercises today. Performed STS in parallel bars with CGA-min A and B UE support. Patient performed transfer quite well, but limited by difficulty following instructions d/t cognition/attention. No complaints at end of session.     OBJECTIVE IMPAIRMENTS: Abnormal gait, decreased activity tolerance, decreased balance, decreased mobility, difficulty walking, decreased ROM, decreased strength, impaired flexibility, improper body mechanics, and postural dysfunction.   ACTIVITY LIMITATIONS: carrying, lifting, bending, sitting, standing, squatting, stairs, transfers, bed mobility, bathing, toileting, dressing, reach over head, hygiene/grooming, and locomotion level  PARTICIPATION LIMITATIONS: community activity and church  PERSONAL FACTORS: Age, Behavior pattern, Past/current experiences, Time since onset of injury/illness/exacerbation, Transportation, and 3+ comorbidities: DMII, GERD, HLD, HTN, anemia, schizophrenia, seizure, stroke with L hemiparesis, benign intracranial tumor s/p craniotomy 2004, dysphagia  are also affecting  patient's functional outcome.   REHAB POTENTIAL: Good  CLINICAL DECISION MAKING: Evolving/moderate complexity  EVALUATION COMPLEXITY: Moderate  PLAN:  PT FREQUENCY: 1x/week  PT DURATION: 6 weeks  PLANNED INTERVENTIONS: Therapeutic exercises, Therapeutic activity, Neuromuscular re-education, Balance training, Gait training, Patient/Family education, Self Care, Joint mobilization, Stair training, Vestibular training, Canalith repositioning, Orthotic/Fit training, DME instructions, Aquatic Therapy, Dry Needling, Electrical stimulation, Cryotherapy, Moist heat, Taping, Manual therapy, and Re-evaluation  PLAN FOR NEXT SESSION: STS transfers at [facility] or II bars, trial standing marching, wt shifting and encourage use of L LE    Anette Guarneri, PT, DPT 05/01/23 11:47 AM  [facility] [address], [address] [place], [place] [address] Phone # [phone] Fax # [phone]

## 2023-05-01 ENCOUNTER — Encounter: Payer: Self-pay | Admitting: Physical Therapy

## 2023-05-01 ENCOUNTER — Ambulatory Visit: Payer: Medicare Other | Admitting: Occupational Therapy

## 2023-05-01 ENCOUNTER — Ambulatory Visit: Payer: Medicare Other | Admitting: Physical Therapy

## 2023-05-01 DIAGNOSIS — I69354 Hemiplegia and hemiparesis following cerebral infarction affecting left non-dominant side: Secondary | ICD-10-CM | POA: Diagnosis not present

## 2023-05-01 DIAGNOSIS — R2681 Unsteadiness on feet: Secondary | ICD-10-CM

## 2023-05-01 DIAGNOSIS — R262 Difficulty in walking, not elsewhere classified: Secondary | ICD-10-CM

## 2023-05-01 DIAGNOSIS — M6281 Muscle weakness (generalized): Secondary | ICD-10-CM

## 2023-05-01 DIAGNOSIS — R41840 Attention and concentration deficit: Secondary | ICD-10-CM

## 2023-05-01 NOTE — Therapy (Signed)
OUTPATIENT OCCUPATIONAL THERAPY NEURO  Treatment Note  Patient Name: [name] MRN: [id number] DOB:1956-07-19, 67 y.o., male Today's Date: 05/01/2023  PCP: Katina Dung, Judie Petit REFERRING PROVIDER: Rolly Salter, MD  END OF SESSION:  OT End of Session - 05/01/23 1235     Visit Number 2    Number of Visits 7    Date for OT Re-Evaluation 06/07/23    Authorization Type Medicare A&B    OT Start Time 1147    OT Stop Time 1212    OT Time Calculation (min) 25 min    Behavior During Therapy Flat affect              Past Medical History:  Diagnosis Date   Barrett esophagus    DM II (diabetes mellitus, type II), controlled (HCC) 11/13/2020   Dysphagia    Gastroparesis    GERD (gastroesophageal reflux disease)    Hypercholesteremia    Hypertension    Iron deficiency anemia    Mental retardation    Schizophrenia (HCC)    Seizure (HCC)    Stroke [facility])    Past Surgical History:  Procedure Laterality Date   BIOPSY  10/21/2020   Procedure: BIOPSY;  Surgeon: Iva Boop, MD;  Location: [facility] ENDOSCOPY;  Service: Endoscopy;;   CRANIOTOMY FOR CYST FENESTRATION     ESOPHAGOGASTRODUODENOSCOPY N/A 01/09/2015   Procedure: ESOPHAGOGASTRODUODENOSCOPY (EGD);  Surgeon: Louis Meckel, MD;  Location: [facility] ENDOSCOPY;  Service: Endoscopy;  Laterality: N/A;   ESOPHAGOGASTRODUODENOSCOPY N/A 10/26/2020   Procedure: ESOPHAGOGASTRODUODENOSCOPY (EGD);  Surgeon: Corliss Skains, MD;  Location: [facility] OR;  Service: Thoracic;  Laterality: N/A;   ESOPHAGOGASTRODUODENOSCOPY (EGD) WITH PROPOFOL N/A 10/21/2020   Procedure: ESOPHAGOGASTRODUODENOSCOPY (EGD) WITH PROPOFOL;  Surgeon: Iva Boop, MD;  Location: [facility] ENDOSCOPY;  Service: Endoscopy;  Laterality: N/A;   IR GASTR TUBE CONVERT GASTR-JEJ PER W/FL MOD SED  11/07/2020   PEG PLACEMENT N/A 10/26/2020   Procedure: PERCUTANEOUS ENDOSCOPIC GASTROSTOMY (PEG) PLACEMENT;  Surgeon: Corliss Skains, MD;  Location: [facility] OR;  Service: Thoracic;   Laterality: N/A;   XI ROBOTIC ASSISTED HIATAL HERNIA REPAIR N/A 10/26/2020   Procedure: XI ROBOTIC ASSISTED HIATAL HERNIA REPAIR;  Surgeon: Corliss Skains, MD;  Location: [facility] OR;  Service: Thoracic;  Laterality: N/A;   Patient Active Problem List   Diagnosis Date Noted   Breakthrough seizure (HCC) 04/11/2023   General weakness 04/09/2023   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) 10/26/2021   Seizure disorder (HCC) 10/26/2021   S/P craniotomy 03/09/2021   Dilation of esophagus    Hyponatremia    Dysphagia    Seizures (HCC)    S/P percutaneous endoscopic gastrostomy (PEG) tube placement (HCC)    Weakness 11/15/2020   DM II (diabetes mellitus, type II), controlled (HCC) 11/13/2020   Bowel obstruction (HCC)    S/P repair of paraesophageal hernia 10/26/2020   Syncope 10/21/2020   Hiatal hernia 10/21/2020   Acute hypoxemic respiratory failure (HCC) 10/21/2020   Sinus tachycardia 10/21/2020   Gastric polyps    Altered mental status 09/05/2020   Emesis 07/24/2015   Coffee ground emesis    Gastrointestinal hemorrhage associated with other gastritis    Acute esophagitis 01/09/2015   Upper gastrointestinal bleed    Diarrhea    Nausea with vomiting    GI bleed 01/08/2015   GIB (gastrointestinal bleeding) 01/08/2015   Nausea & vomiting 01/08/2015   Reflux esophagitis 07/04/2009   Loss of weight 07/04/2009  BLOOD IN STOOL, OCCULT 07/04/2009   ANEMIA, IRON DEFICIENCY 05/04/2008   Hypothyroidism 04/05/2008   Schizophrenia, unspecified type (HCC) 04/05/2008   Depression 04/05/2008   Hemiplegia (HCC) 04/05/2008   Essential hypertension 04/05/2008   GERD 04/05/2008   Gastroparesis 04/05/2008   Seizure (HCC) 04/05/2008   History of diverticulosis 08/21/2004   Barrett's esophagus 04/01/2002   HIATAL HERNIA 04/01/2002    ONSET DATE: 04/09/23  REFERRING DIAG: R53.1 (ICD-10-CM) - Left-sided weakness G40.919 (ICD-10-CM) -  Breakthrough seizure  THERAPY DIAG:  Muscle weakness (generalized)  Hemiplegia and 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  Pt reports that he uses a walker and w/c for mobility.  Pt with inconsistent report on PLOF and current status, therefore caregiver present for portion of session to address current status and PLOF.  Pt's caregiver reports that pt's strength is weaker on the L side and that his cognition is different.  Caregiver reports that pt is w/c level since hospitalization and requires +2 for all transfers and self-care tasks. 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 No  FALLS: Has patient fallen in last 6 months? No  LIVING ENVIRONMENT: Lives with: lives in an adult home Lives in: House/apartment Stairs: No Has following equipment at home: Dan Humphreys - 4 wheeled, Wheelchair (power), shower chair, and Grab bars  PLOF:  Mod I for toileting, but Min-mod assist for ADLs.  Utilized Rollator for mobility prior to most recent hospitalization  PATIENT GOALS: to have a long life  OBJECTIVE:   HAND DOMINANCE: Right  ADLs: Overall ADLs: +2 assist for all mobility and self-care tasks. +2 for transfers into shower, utilizing shower chair Transfers/ambulation related to ADLs: +2 assist Eating: setup to open containers, but then pt able to feed himself Grooming: total assist UB Dressing: total assist LB Dressing: +2 assist Toileting: +2  assist Bathing: +2 assist Tub Shower transfers: +2 assist, handicap accessible shower with seat Equipment: Shower seat with back and Walk in shower   MOBILITY STATUS:  utilizing w/c at this point for mobility  POSTURE COMMENTS:  rounded shoulders, forward head, and posterior pelvic tilt   UPPER EXTREMITY ROM:  AROM: Pt initially able to lift arm ~20-30% from arm rest, utilizing abduction to achieve ~60* shoulder flexion; however when directed to move to assess ROM pt stating no.  OT attempted to explain purpose of therapy and evaluation to assess ROM, however pt continuing to shake head and state no to further attempts at AROM.  Did require max encouragement to assess PROM.  Passive ROM Right eval Left eval  Shoulder flexion  30*  Shoulder abduction  80*  Shoulder adduction    Shoulder extension    Shoulder internal rotation    Shoulder external rotation    Elbow flexion  WNL  Elbow extension  WNL  Wrist flexion  50%  Wrist extension  25%  Wrist ulnar deviation    Wrist radial deviation    Wrist pronation    Wrist supination    (Blank rows = not tested)   HAND FUNCTION: Unable to assess as  pt keeping hand in partial fist.  Pt is able to open digits fully, except for thumb, with increased time and demonstration  COORDINATION: Pt would not attempt to pick any items up with L hand despite multiple attempts and multiple items  SENSATION: Inconsistent report.  Pt reports both hands feel the same, but also reports numbness on L side since stroke   COGNITION: Overall cognitive status: History of cognitive impairments - at baseline  VISION: Subjective report: unable to assess due to inconsistent report, cognition impacting ability to attend and participate   OBSERVATIONS: Pt with inconsistent report and frequently shaking head when therapist encouraging attempts at AROM for LUE.  Pt did lift LUE spontaneously when attempting to reach towards RW during PT session (OT present  as +2 initially).     TODAY'S TREATMENT:                                                                       05/01/23 Towel slides: OT educated pt on shoulder flexion/extension and horizontal abduction/adduction on table top.  OT facilitating motion with support at elbow encouraging pt to utilize hand over hand positioning to allow for increased ROM.  Pt benefiting from cues to attend to LUE for increased ROM and quality of movement. Self-ROM: attempted shoulder shrugs with support of RUE under L arm to facilitate increased ROM, however pt frequently just leaning forward.  OT attempted to provide hand over hand and facilitation, however no carryover.  Transitioned to shoulder flexion with RUE supporting LUE.  OT providing assistance to set up position and demonstration and cues to facilitate "push up" instead of "pulling up" on LUE to prevent injury.  Attempted to instruct in horizontal abduction, however pt stating "I refuse".  OT attempted to redirect and provide alternative instructions, however pt continuing to state that he refused to continue.  Pt did agree to having therapist educate caregiver on exercises completed to this point.   04/25/23 NA, eval only  PATIENT EDUCATION: Education details: Educated on role and purpose of OT as well as potential interventions and goals for therapy based on initial evaluation findings. Person educated: Patient Education method: Explanation Education comprehension: needs further education  HOME EXERCISE PROGRAM: Self-ROM (see pt instructions)   GOALS: Goals reviewed with patient? Yes  SHORT TERM GOALS: Target date: 05/17/23  Pt and caregiver will be independent with simple HEP and functional activities to encourage simple assist from LUE and to increase awareness/attention of LUE. Baseline: Goal status: IN PROGRESS  2.  Pt will tolerate standing with supervision to allow caregiver to complete clothing management/hygiene for toileting. Baseline:   Goal status: IN PROGRESS  3.  Pt will be able to complete UB dressing with min assist and min cues for hemi-technique. Baseline:  Goal status: IN PROGRESS   LONG TERM GOALS: Target date: 06/07/23  Pt will complete toilet transfer, stand pivot, with min assist of one caregiver. Baseline:  Goal status: IN PROGRESS  2.  Pt will assist with bathing to complete 50%of task, incorporating use of LUE at gross assist level and with use of AE prn  Baseline:  Goal status: IN PROGRESS  3.  Pt will complete clothing management post toileting with min assist of one caregiver for  safety and sequencing. Baseline:  Goal status: IN PROGRESS  4.  Pt will be able to complete LB dressing with mod assist and min cues for hemi-technique. Baseline:  Goal status: IN PROGRESS  ASSESSMENT:  CLINICAL IMPRESSION: Pt tolerating self-ROM exercises with towel slides and shoulder flexion with tolerance to hand over hand and facilitation for increased positioning to facilitate increased ROM.  Pt more participatory with tasks this session compared to evaluation.  However when attempting horizontal abduction, pt stating "I refuse" and was unable to be redirected.  Pt's caregiver did state that pt has a tendency to refuse.  OT provided handouts (see pt instructions) to pt and caregiver.   PERFORMANCE DEFICITS: in functional skills including ADLs, coordination, sensation, ROM, strength, flexibility, Fine motor control, Gross motor control, balance, body mechanics, endurance, decreased knowledge of use of DME, and UE functional use, cognitive skills including attention, safety awareness, sequencing, and understand, and psychosocial skills including environmental adaptation.   IMPAIRMENTS: are limiting patient from ADLs.   CO-MORBIDITIES: may have co-morbidities  that affects occupational performance. Patient will benefit from skilled OT to address above impairments and improve overall function.  MODIFICATION OR  ASSISTANCE TO COMPLETE EVALUATION: Min-Moderate modification of tasks or assist with assess necessary to complete an evaluation.  OT OCCUPATIONAL PROFILE AND HISTORY: Detailed assessment: Review of records and additional review of physical, cognitive, psychosocial history related to current functional performance.  CLINICAL DECISION MAKING: Moderate - several treatment options, min-mod task modification necessary  REHAB POTENTIAL: Good  EVALUATION COMPLEXITY: Moderate    PLAN:  OT FREQUENCY: 1x/week  OT DURATION: 6 weeks  PLANNED INTERVENTIONS: self care/ADL training, therapeutic exercise, therapeutic activity, neuromuscular re-education, manual therapy, passive range of motion, balance training, functional mobility training, splinting, ultrasound, paraffin, fluidotherapy, compression bandaging, moist heat, cryotherapy, patient/family education, cognitive remediation/compensation, visual/perceptual remediation/compensation, psychosocial skills training, coping strategies training, and DME and/or AE instructions  RECOMMENDED OTHER SERVICES: SLP eval  CONSULTED AND AGREED WITH PLAN OF CARE: Patient  PLAN FOR NEXT SESSION: Continue with HEP for self-ROM and caregiver PROM for LUE, sit > stand, transfers to decrease burden of care with toileting and LB dressing.   Rosalio Loud, OTR/L 05/01/2023, 12:41 PM

## 2023-05-07 NOTE — Therapy (Signed)
OUTPATIENT OCCUPATIONAL THERAPY NEURO  Treatment Note  Patient Name: [name] MRN: [id number] DOB:1956-07-19, 67 y.o., male Today's Date: 05/01/2023  PCP: Katina Dung, Judie Petit REFERRING PROVIDER: Rolly Salter, MD  END OF SESSION:  OT End of Session - 05/01/23 1235     Visit Number 2    Number of Visits 7    Date for OT Re-Evaluation 06/07/23    Authorization Type Medicare A&B    OT Start Time 1147    OT Stop Time 1212    OT Time Calculation (min) 25 min    Behavior During Therapy Flat affect              Past Medical History:  Diagnosis Date   Barrett esophagus    DM II (diabetes mellitus, type II), controlled (HCC) 11/13/2020   Dysphagia    Gastroparesis    GERD (gastroesophageal reflux disease)    Hypercholesteremia    Hypertension    Iron deficiency anemia    Mental retardation    Schizophrenia (HCC)    Seizure (HCC)    Stroke [facility])    Past Surgical History:  Procedure Laterality Date   BIOPSY  10/21/2020   Procedure: BIOPSY;  Surgeon: Iva Boop, MD;  Location: [facility] ENDOSCOPY;  Service: Endoscopy;;   CRANIOTOMY FOR CYST FENESTRATION     ESOPHAGOGASTRODUODENOSCOPY N/A 01/09/2015   Procedure: ESOPHAGOGASTRODUODENOSCOPY (EGD);  Surgeon: Louis Meckel, MD;  Location: [facility] ENDOSCOPY;  Service: Endoscopy;  Laterality: N/A;   ESOPHAGOGASTRODUODENOSCOPY N/A 10/26/2020   Procedure: ESOPHAGOGASTRODUODENOSCOPY (EGD);  Surgeon: Corliss Skains, MD;  Location: [facility] OR;  Service: Thoracic;  Laterality: N/A;   ESOPHAGOGASTRODUODENOSCOPY (EGD) WITH PROPOFOL N/A 10/21/2020   Procedure: ESOPHAGOGASTRODUODENOSCOPY (EGD) WITH PROPOFOL;  Surgeon: Iva Boop, MD;  Location: [facility] ENDOSCOPY;  Service: Endoscopy;  Laterality: N/A;   IR GASTR TUBE CONVERT GASTR-JEJ PER W/FL MOD SED  11/07/2020   PEG PLACEMENT N/A 10/26/2020   Procedure: PERCUTANEOUS ENDOSCOPIC GASTROSTOMY (PEG) PLACEMENT;  Surgeon: Corliss Skains, MD;  Location: [facility] OR;  Service: Thoracic;   Laterality: N/A;   XI ROBOTIC ASSISTED HIATAL HERNIA REPAIR N/A 10/26/2020   Procedure: XI ROBOTIC ASSISTED HIATAL HERNIA REPAIR;  Surgeon: Corliss Skains, MD;  Location: [facility] OR;  Service: Thoracic;  Laterality: N/A;   Patient Active Problem List   Diagnosis Date Noted   Breakthrough seizure (HCC) 04/11/2023   General weakness 04/09/2023   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) 10/26/2021   Seizure disorder (HCC) 10/26/2021   S/P craniotomy 03/09/2021   Dilation of esophagus    Hyponatremia    Dysphagia    Seizures (HCC)    S/P percutaneous endoscopic gastrostomy (PEG) tube placement (HCC)    Weakness 11/15/2020   DM II (diabetes mellitus, type II), controlled (HCC) 11/13/2020   Bowel obstruction (HCC)    S/P repair of paraesophageal hernia 10/26/2020   Syncope 10/21/2020   Hiatal hernia 10/21/2020   Acute hypoxemic respiratory failure (HCC) 10/21/2020   Sinus tachycardia 10/21/2020   Gastric polyps    Altered mental status 09/05/2020   Emesis 07/24/2015   Coffee ground emesis    Gastrointestinal hemorrhage associated with other gastritis    Acute esophagitis 01/09/2015   Upper gastrointestinal bleed    Diarrhea    Nausea with vomiting    GI bleed 01/08/2015   GIB (gastrointestinal bleeding) 01/08/2015   Nausea & vomiting 01/08/2015   Reflux esophagitis 07/04/2009   Loss of weight 07/04/2009  BLOOD IN STOOL, OCCULT 07/04/2009   ANEMIA, IRON DEFICIENCY 05/04/2008   Hypothyroidism 04/05/2008   Schizophrenia, unspecified type (HCC) 04/05/2008   Depression 04/05/2008   Hemiplegia (HCC) 04/05/2008   Essential hypertension 04/05/2008   GERD 04/05/2008   Gastroparesis 04/05/2008   Seizure (HCC) 04/05/2008   History of diverticulosis 08/21/2004   Barrett's esophagus 04/01/2002   HIATAL HERNIA 04/01/2002    ONSET DATE: 04/09/23  REFERRING DIAG: R53.1 (ICD-10-CM) - Left-sided weakness G40.919 (ICD-10-CM) -  Breakthrough seizure  THERAPY DIAG:  Muscle weakness (generalized)  Hemiplegia and hemiparesis following cerebral infarction affecting left non-dominant side (HCC)  Attention and concentration deficit  Rationale for Evaluation and Treatment: Rehabilitation  PERTINENT HISTORY: PMH: GERD, Barrett esophagus, gastroparesis, HTN, CVA (large R frontal) with L sided deficits, mental retardation, schizophrenia, hypothyroidism, seizures, benign intracranial tumor s/p craniotomy 2004, dysphagia, HLD "Pt states that his LUE is weak.  Pt reports that he uses a walker and w/c for mobility.  Pt with inconsistent report on PLOF and current status, therefore caregiver present for portion of session to address current status and PLOF.  Pt's caregiver reports that pt's strength is weaker on the L side and that his cognition is different.  Caregiver reports that pt is w/c level since hospitalization and requires +2 for all transfers and self-care tasks."  PRECAUTIONS: Other: seizures; WEIGHT BEARING RESTRICTIONS: No   SUBJECTIVE:   SUBJECTIVE STATEMENT: He states ***.   Pt accompanied by: self and caregiver from group home   PAIN:  Are you having pain? No ***  FALLS: Has patient fallen in last 6 months? No  LIVING ENVIRONMENT: Lives with: lives in an adult home Lives in: House/apartment Stairs: No Has following equipment at home: Dan Humphreys - 4 wheeled, Wheelchair (power), shower chair, and Grab bars  PLOF:  Mod I for toileting, but Min-mod assist for ADLs.  Utilized Rollator for mobility prior to most recent hospitalization  PATIENT GOALS: to have a long life   OBJECTIVE: (All objective assessments below are from initial evaluation on: 04/25/23 unless otherwise specified.)   HAND DOMINANCE: Right  ADLs: Overall ADLs: +2 assist for all mobility and self-care tasks. +2 for transfers into shower, utilizing shower chair Transfers/ambulation related to ADLs: +2 assist Eating: setup to open  containers, but then pt able to feed himself Grooming: total assist UB Dressing: total assist LB Dressing: +2 assist Toileting: +2 assist Bathing: +2 assist Tub Shower transfers: +2 assist, handicap accessible shower with seat Equipment: Shower seat with back and Walk in shower   MOBILITY STATUS:  utilizing w/c at this point for mobility  POSTURE COMMENTS:  rounded shoulders, forward head, and posterior pelvic tilt   UPPER EXTREMITY ROM:  AROM: Pt initially able to lift arm ~20-30% from arm rest, utilizing abduction to achieve ~60* shoulder flexion; however when directed to move to assess ROM pt stating no.  OT attempted to explain purpose of therapy and evaluation to assess ROM, however pt continuing to shake head and state no to further attempts at AROM.  Did require max encouragement to assess PROM.  Passive ROM Left eval  Shoulder flexion 30*  Shoulder abduction 80*  Shoulder adduction   Shoulder extension   Shoulder internal rotation   Shoulder external rotation   Elbow flexion WNL  Elbow extension WNL  Wrist flexion 50%  Wrist extension 25%  Wrist ulnar deviation   Wrist radial deviation   Wrist pronation   Wrist supination   (Blank rows = not tested)   HAND  FUNCTION: Unable to assess as pt keeping hand in partial fist.  Pt is able to open digits fully, except for thumb, with increased time and demonstration  COORDINATION: Pt would not attempt to pick any items up with L hand despite multiple attempts and multiple items  SENSATION: Inconsistent report.  Pt reports both hands feel the same, but also reports numbness on L side since stroke  COGNITION: Overall cognitive status: History of cognitive impairments - at baseline  VISION: Subjective report: unable to assess due to inconsistent report, cognition impacting ability to attend and participate   OBSERVATIONS: Pt with inconsistent report and frequently shaking head when therapist encouraging attempts at AROM  for LUE.  Pt did lift LUE spontaneously when attempting to reach towards RW during PT session (OT present as +2 initially).     TODAY'S TREATMENT:                                                                       05/08/23: *** Continue with HEP for self-ROM and caregiver PROM for LUE, sit > stand, transfers to decrease burden of care with toileting and LB dressing.   05/01/23 Towel slides: OT educated pt on shoulder flexion/extension and horizontal abduction/adduction on table top.  OT facilitating motion with support at elbow encouraging pt to utilize hand over hand positioning to allow for increased ROM.  Pt benefiting from cues to attend to LUE for increased ROM and quality of movement. Self-ROM: attempted shoulder shrugs with support of RUE under L arm to facilitate increased ROM, however pt frequently just leaning forward.  OT attempted to provide hand over hand and facilitation, however no carryover.  Transitioned to shoulder flexion with RUE supporting LUE.  OT providing assistance to set up position and demonstration and cues to facilitate "push up" instead of "pulling up" on LUE to prevent injury.  Attempted to instruct in horizontal abduction, however pt stating "I refuse".  OT attempted to redirect and provide alternative instructions, however pt continuing to state that he refused to continue.  Pt did agree to having therapist educate caregiver on exercises completed to this point.   04/25/23 NA, eval only  PATIENT EDUCATION: Education details: Educated on role and purpose of OT as well as potential interventions and goals for therapy based on initial evaluation findings. Person educated: Patient Education method: Explanation Education comprehension: needs further education  HOME EXERCISE PROGRAM: Self-ROM (see pt instructions)   GOALS: Goals reviewed with patient? Yes  SHORT TERM GOALS: Target date: 05/17/23  Pt and caregiver will be independent with simple HEP and functional  activities to encourage simple assist from LUE and to increase awareness/attention of LUE. Baseline: Goal status: IN PROGRESS  2.  Pt will tolerate standing with supervision to allow caregiver to complete clothing management/hygiene for toileting. Baseline:  Goal status: IN PROGRESS  3.  Pt will be able to complete UB dressing with min assist and min cues for hemi-technique. Baseline:  Goal status: IN PROGRESS   LONG TERM GOALS: Target date: 06/07/23  Pt will complete toilet transfer, stand pivot, with min assist of one caregiver. Baseline:  Goal status: IN PROGRESS  2.  Pt will assist with bathing to complete 50%of task, incorporating use of LUE at gross  assist level and with use of AE prn  Baseline:  Goal status: IN PROGRESS  3.  Pt will complete clothing management post toileting with min assist of one caregiver for safety and sequencing. Baseline:  Goal status: IN PROGRESS  4.  Pt will be able to complete LB dressing with mod assist and min cues for hemi-technique. Baseline:  Goal status: IN PROGRESS  ASSESSMENT:  CLINICAL IMPRESSION: 05/08/23: ***  05/01/23: Pt tolerating self-ROM exercises with towel slides and shoulder flexion with tolerance to hand over hand and facilitation for increased positioning to facilitate increased ROM.  Pt more participatory with tasks this session compared to evaluation.  However when attempting horizontal abduction, pt stating "I refuse" and was unable to be redirected.  Pt's caregiver did state that pt has a tendency to refuse.  OT provided handouts (see pt instructions) to pt and caregiver.    PLAN:  OT FREQUENCY: 1x/week  OT DURATION: 6 weeks  PLANNED INTERVENTIONS: self care/ADL training, therapeutic exercise, therapeutic activity, neuromuscular re-education, manual therapy, passive range of motion, balance training, functional mobility training, splinting, ultrasound, paraffin, fluidotherapy, compression bandaging, moist heat,  cryotherapy, patient/family education, cognitive remediation/compensation, visual/perceptual remediation/compensation, psychosocial skills training, coping strategies training, and DME and/or AE instructions  RECOMMENDED OTHER SERVICES: SLP eval  CONSULTED AND AGREED WITH PLAN OF CARE: Patient  PLAN FOR NEXT SESSIONRosalio Loud, OTR/L 05/01/2023, 12:41 PM

## 2023-05-08 ENCOUNTER — Encounter: Payer: Self-pay | Admitting: Physical Therapy

## 2023-05-08 ENCOUNTER — Ambulatory Visit: Payer: Medicare Other | Admitting: Physical Therapy

## 2023-05-08 ENCOUNTER — Ambulatory Visit: Payer: Medicare Other | Admitting: Rehabilitative and Restorative Service Providers"

## 2023-05-08 ENCOUNTER — Encounter: Payer: Self-pay | Admitting: Rehabilitative and Restorative Service Providers"

## 2023-05-08 DIAGNOSIS — R41840 Attention and concentration deficit: Secondary | ICD-10-CM

## 2023-05-08 DIAGNOSIS — M6281 Muscle weakness (generalized): Secondary | ICD-10-CM

## 2023-05-08 DIAGNOSIS — R208 Other disturbances of skin sensation: Secondary | ICD-10-CM

## 2023-05-08 DIAGNOSIS — R2689 Other abnormalities of gait and mobility: Secondary | ICD-10-CM

## 2023-05-08 DIAGNOSIS — I69354 Hemiplegia and hemiparesis following cerebral infarction affecting left non-dominant side: Secondary | ICD-10-CM

## 2023-05-08 DIAGNOSIS — R2681 Unsteadiness on feet: Secondary | ICD-10-CM

## 2023-05-08 DIAGNOSIS — R278 Other lack of coordination: Secondary | ICD-10-CM

## 2023-05-08 NOTE — Therapy (Signed)
OUTPATIENT PHYSICAL THERAPY NEURO TREATMENT   Patient Name: [name] MRN: [id number] DOB:July 01, 1956, 67 y.o., male Today's Date: 05/09/2023   PCP: Lucretia Field  REFERRING PROVIDER: Rolly Salter, MD  END OF SESSION:  PT End of Session - 05/08/23 1110     Visit Number 3    Number of Visits 7    Date for PT Re-Evaluation 06/06/23    Authorization Type Medicare/Medicaid    PT Start Time 1110   pt arrives late   PT Stop Time 1145    PT Time Calculation (min) 35 min    Equipment Utilized During Treatment Gait belt    Activity Tolerance Patient tolerated treatment well    Behavior During Therapy Flat affect;Impulsive              Past Medical History:  Diagnosis Date   Barrett esophagus    DM II (diabetes mellitus, type II), controlled (HCC) 11/13/2020   Dysphagia    Gastroparesis    GERD (gastroesophageal reflux disease)    Hypercholesteremia    Hypertension    Iron deficiency anemia    Mental retardation    Schizophrenia (HCC)    Seizure (HCC)    Stroke [facility])    Past Surgical History:  Procedure Laterality Date   BIOPSY  10/21/2020   Procedure: BIOPSY;  Surgeon: Iva Boop, MD;  Location: [facility] ENDOSCOPY;  Service: Endoscopy;;   CRANIOTOMY FOR CYST FENESTRATION     ESOPHAGOGASTRODUODENOSCOPY N/A 01/09/2015   Procedure: ESOPHAGOGASTRODUODENOSCOPY (EGD);  Surgeon: Louis Meckel, MD;  Location: [facility] ENDOSCOPY;  Service: Endoscopy;  Laterality: N/A;   ESOPHAGOGASTRODUODENOSCOPY N/A 10/26/2020   Procedure: ESOPHAGOGASTRODUODENOSCOPY (EGD);  Surgeon: Corliss Skains, MD;  Location: [facility] OR;  Service: Thoracic;  Laterality: N/A;   ESOPHAGOGASTRODUODENOSCOPY (EGD) WITH PROPOFOL N/A 10/21/2020   Procedure: ESOPHAGOGASTRODUODENOSCOPY (EGD) WITH PROPOFOL;  Surgeon: Iva Boop, MD;  Location: [facility] ENDOSCOPY;  Service: Endoscopy;  Laterality: N/A;   IR GASTR TUBE CONVERT GASTR-JEJ PER W/FL MOD SED  11/07/2020   PEG PLACEMENT N/A 10/26/2020    Procedure: PERCUTANEOUS ENDOSCOPIC GASTROSTOMY (PEG) PLACEMENT;  Surgeon: Corliss Skains, MD;  Location: [facility] OR;  Service: Thoracic;  Laterality: N/A;   XI ROBOTIC ASSISTED HIATAL HERNIA REPAIR N/A 10/26/2020   Procedure: XI ROBOTIC ASSISTED HIATAL HERNIA REPAIR;  Surgeon: Corliss Skains, MD;  Location: [facility] OR;  Service: Thoracic;  Laterality: N/A;   Patient Active Problem List   Diagnosis Date Noted   Breakthrough seizure (HCC) 04/11/2023   General weakness 04/09/2023   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) 10/26/2021   Seizure disorder (HCC) 10/26/2021   S/P craniotomy 03/09/2021   Dilation of esophagus    Hyponatremia    Dysphagia    Seizures (HCC)    S/P percutaneous endoscopic gastrostomy (PEG) tube placement (HCC)    Weakness 11/15/2020   DM II (diabetes mellitus, type II), controlled (HCC) 11/13/2020   Bowel obstruction (HCC)    S/P repair of paraesophageal hernia 10/26/2020   Syncope 10/21/2020   Hiatal hernia 10/21/2020   Acute hypoxemic respiratory failure (HCC) 10/21/2020   Sinus tachycardia 10/21/2020   Gastric polyps    Altered mental status 09/05/2020   Emesis 07/24/2015   Coffee ground emesis    Gastrointestinal hemorrhage associated with other gastritis    Acute esophagitis 01/09/2015   Upper gastrointestinal bleed    Diarrhea    Nausea with vomiting    GI bleed 01/08/2015  GIB (gastrointestinal bleeding) 01/08/2015   Nausea & vomiting 01/08/2015   Reflux esophagitis 07/04/2009   Loss of weight 07/04/2009   BLOOD IN STOOL, OCCULT 07/04/2009   ANEMIA, IRON DEFICIENCY 05/04/2008   Hypothyroidism 04/05/2008   Schizophrenia, unspecified type (HCC) 04/05/2008   Depression 04/05/2008   Hemiplegia (HCC) 04/05/2008   Essential hypertension 04/05/2008   GERD 04/05/2008   Gastroparesis 04/05/2008   Seizure (HCC) 04/05/2008   History of diverticulosis 08/21/2004   Barrett's esophagus 04/01/2002    HIATAL HERNIA 04/01/2002    ONSET DATE: 04/09/23  REFERRING DIAG: R53.1 (ICD-10-CM) - Left-sided weakness G40.919 (ICD-10-CM) - Breakthrough seizure (HCC)  THERAPY DIAG:  Muscle weakness (generalized)  Unsteadiness on feet  Other abnormalities of gait and mobility  Rationale for Evaluation and Treatment: Rehabilitation  SUBJECTIVE:                                                                                                                                                                                             SUBJECTIVE STATEMENT: Caregiver reports he feels like pt is getting a bit stronger compared to last visit.  Pt accompanied by: self, caregiver  PERTINENT HISTORY: DMII, GERD, HLD, HTN, anemia, schizophrenia, seizure, stroke with L hemiparesis, benign intracranial tumor s/p craniotomy 2004, dysphagia, cognitive deficit   PAIN:  Are you having pain? No  PRECAUTIONS: Fall and Other: seizures  WEIGHT BEARING RESTRICTIONS: No  FALLS: Has patient fallen in last 6 months? No  LIVING ENVIRONMENT: Per OT's note as caregiver not present for PT session:  Lives with: lives in an adult home Lives in: House/apartment Stairs: No Has following equipment at home: Dan Humphreys - 4 wheeled, Wheelchair (power), shower chair, and Grab bars   PLOF:  Mod I for toileting, but Min-mod assist for ADLs.  Utilized Rollator for mobility prior to most recent hospitalization  PATIENT GOALS: pt agrees that he wants to work on moving better   OBJECTIVE:       TODAY'S TREATMENT: 05/08/23 Activity Comments  review of HEP: sitting march 2x10 LAQ 2x10  sitting ball squeeze 10x3"  2nd set of sitting march and LAQ, 1.5 # weight Manual cueing to evoke participation of L LE and verbal cueing for "R leg, L leg." Initially compensating with hip flexors to perform LAQ, which improved with manual cueing to keep thigh down.      STS transfers with counter in front  Cues for equal foot placement  for increased LLE weightbearing B hand pulling up to stand at counter with CGA-min A; pt attempted to take steps and PT had to direct static standing d/t safety. 5 trials: 10  sec each  Attempted standing marching at parallel bar, x 2 Pt begins to walk forward/back in parallel bars, decreased safety awareness, PT with max cues to safely sit in w/c    Sit to stand in parallel bars, with PT in front of pt, longest trial 2 minutes Cues to stand his full height for improved posture  Standing with L lateral weightshifting 3 x 5 reps  Cues to "bump the bar"      HOME EXERCISE PROGRAM: Access Code: [email or web address] URL: [email or web address] Date: 04/25/2023 Prepared by: [facility] - Outpatient  Rehab - [facility]  Exercises - Seated March  - 1 x daily - 5 x weekly - 2 sets - 10 reps - Seated Long Arc Quad  - 1 x daily - 5 x weekly - 2 sets - 10 reps - Seated Hip Adduction Isometrics with Ball  - 1 x daily - 5 x weekly - 2 sets - 10 reps - 3 sec hold - Sit to Stand with Counter Support  - 1 x daily - 5 x weekly - 2 sets - 10 reps    Below measures were taken at time of initial evaluation unless otherwise specified:   DIAGNOSTIC FINDINGS: 04/10/23 brain MRI: No acute intracranial process. Unchanged resection cavity with surrounding encephalomalacia and gliosis in the right frontal lobe.  COGNITION: Overall cognitive status: History of cognitive impairments - at baseline   SENSATION: Intact in B Les to light touch  COORDINATION:  Alternating toe tap: pt lifting R and L LE with hip flexors rather than DF; limited motion on L  MUSCLE TONE: appears intact in L LE but limited by cognition/understanding on R LE d/t tendency to try to perform AROM  POSTURE: posterior pelvic tilt and rounded spine in w/c, head bend downward and to the R  LOWER EXTREMITY MMT:     MMT Right Eval Left Eval  Hip flexion 4 3  Hip extension    Hip abduction 4 2  Hip adduction 4 2  Hip internal  rotation    Hip external rotation    Knee flexion 4+ 1  Knee extension 4+ 4-  Ankle dorsiflexion 4 0 (unable to elicit contraction but unsure if limited by cognition)  Ankle plantarflexion 4 0  (unable to elicit contraction but unsure if limited by cognition)  Ankle inversion    Ankle eversion     (Blank rows = not tested)  LOWER EXTREMITY MMT:    AROM Right Eval Left Eval  Hip flexion    Hip extension    Hip abduction    Hip adduction    Hip internal rotation    Hip external rotation    Knee flexion    Knee extension    Ankle dorsiflexion 6 -15  Ankle plantarflexion    Ankle inversion    Ankle eversion    (Blank rows = not tested)  TRANSFERS: Assistive device utilized: Environmental consultant - 2 wheeled  Sit to stand: Min A and x2 Stand to sit: Min A and x2 Pt able to perform marching with R LE but unable to move L LE; required assist in managing L LE on RW   GAIT: Gait pattern:  pt unable today Trialed self-w/c propulsion with R UE but with limited ability and path deviation     TODAY'S TREATMENT:  DATE: 5/2    PATIENT EDUCATION: Education details: prognosis, POC, HEP- with caregiver assist Person educated: Patient and Caregiver Greig Castilla Education method: Explanation, Demonstration, Tactile cues, Verbal cues, and Handouts Education comprehension: verbalized understanding  HOME EXERCISE PROGRAM: Access Code: [email or web address] URL: [email or web address] Date: 04/25/2023 Prepared by: [facility] - Outpatient  Rehab - [facility]  Exercises - Seated March  - 1 x daily - 5 x weekly - 2 sets - 10 reps - Seated Long Arc Quad  - 1 x daily - 5 x weekly - 2 sets - 10 reps - Seated Hip Adduction Isometrics with Ball  - 1 x daily - 5 x weekly - 2 sets - 10 reps - 3 sec hold - Sit to Stand with Counter Support  - 1 x daily - 5 x weekly - 2 sets - 10  reps  GOALS: Goals reviewed with patient? Yes  SHORT TERM GOALS: Target date: 05/16/2023  Patient to perform initial HEP with caregiver assist. Baseline: HEP initiated Goal status: IN PROGRESS    LONG TERM GOALS: Target date: 06/06/2023  Patient to perform advanced HEP with caregiver assist. Baseline: Not yet initiated  Goal status: IN PROGRESS  Patient to demonstrate L LE strength >/=3/5.  Baseline: See above Goal status: IN PROGRESS  Patient to demonstrate improved head, pelvic, and spinal position in w/c,  Baseline: rounded posture, head drooping down Goal status: IN PROGRESS  Patient to perform sit to stand transfer with supervision and LRAD.   Baseline: min A x 2 with RW Goal status: IN PROGRESS  Patient to ambulate 30 feet with LRAD and CGA.  Baseline: unable Goal status: IN PROGRESS   ASSESSMENT:  CLINICAL IMPRESSION: Patient arrived to session late today, with caregiver without complaints. Caregiver does report that he feels pt is getting stronger.  Began session with review of HEP, which continues to require heavy verbal and manual cueing for proper movement and muscle activation. Attempted standing activities at counter, but pt with quick stand to sit and attempts at stepping away from w/c, so transitioned to parallel bars.  He continued to try to take steps forward/back and attempts to sit prior to having w/c close enough.  Pt does better with PT in front of him during subsequent standing work in parallel bars, with good attempts at improved posture and L lateral weightshifting. No complaints at end of session.     OBJECTIVE IMPAIRMENTS: Abnormal gait, decreased activity tolerance, decreased balance, decreased mobility, difficulty walking, decreased ROM, decreased strength, impaired flexibility, improper body mechanics, and postural dysfunction.   ACTIVITY LIMITATIONS: carrying, lifting, bending, sitting, standing, squatting, stairs, transfers, bed mobility,  bathing, toileting, dressing, reach over head, hygiene/grooming, and locomotion level  PARTICIPATION LIMITATIONS: community activity and church  PERSONAL FACTORS: Age, Behavior pattern, Past/current experiences, Time since onset of injury/illness/exacerbation, Transportation, and 3+ comorbidities: DMII, GERD, HLD, HTN, anemia, schizophrenia, seizure, stroke with L hemiparesis, benign intracranial tumor s/p craniotomy 2004, dysphagia  are also affecting patient's functional outcome.   REHAB POTENTIAL: Good  CLINICAL DECISION MAKING: Evolving/moderate complexity  EVALUATION COMPLEXITY: Moderate  PLAN:  PT FREQUENCY: 1x/week  PT DURATION: 6 weeks  PLANNED INTERVENTIONS: Therapeutic exercises, Therapeutic activity, Neuromuscular re-education, Balance training, Gait training, Patient/Family education, Self Care, Joint mobilization, Stair training, Vestibular training, Canalith repositioning, Orthotic/Fit training, DME instructions, Aquatic Therapy, Dry Needling, Electrical stimulation, Cryotherapy, Moist heat, Taping, Manual therapy, and Re-evaluation  PLAN FOR NEXT SESSION: Continue STS transfers at RW or parallel bars, trial standing marching, wt shifting and encourage  use of L Unk Pinto, PT 05/09/23 7:33 AM Phone: [phone] Fax: [phone]  [facility] at [facility] [address] [place], [address] [place], [place] [address] Phone # [phone] Fax # [phone]

## 2023-05-13 ENCOUNTER — Ambulatory Visit (INDEPENDENT_AMBULATORY_CARE_PROVIDER_SITE_OTHER): Payer: Medicare Other | Admitting: Neurology

## 2023-05-13 DIAGNOSIS — G40909 Epilepsy, unspecified, not intractable, without status epilepticus: Secondary | ICD-10-CM

## 2023-05-13 DIAGNOSIS — Z9889 Other specified postprocedural states: Secondary | ICD-10-CM

## 2023-05-13 DIAGNOSIS — I69954 Hemiplegia and hemiparesis following unspecified cerebrovascular disease affecting left non-dominant side: Secondary | ICD-10-CM

## 2023-05-13 NOTE — Procedures (Signed)
   HISTORY: 67 year old male with history of complex partial seizure  TECHNIQUE:  This is a routine 16 channel EEG recording with one channel devoted to a limited EKG recording.  It was performed during wakefulness, drowsiness and asleep.  Photic stimulation were performed as activating procedures.  There are minimum muscle and movement artifact noted.  Upon maximum arousal, posterior dominant waking rhythm consistent of mildly dysrhythmic theta range activity. Activities are symmetric over the bilateral posterior derivations and attenuated with eye opening.  Photic stimulation did not alter the tracing.  Hyperventilation was not performed During EEG recording, patient developed drowsiness and no deeper stage of sleep was achieved During EEG recording, there was no epileptiform discharge noted.  EKG demonstrate normal sinus rhythm.  CONCLUSION: This is a mild abnormal study due to mild background slowing, indicating mild bihemispheric malfunction.  [name], M.D. Ph.D.  [facility] [address] [place], [place] [address] Phone: [phone] Fax:      [phone]

## 2023-05-15 ENCOUNTER — Ambulatory Visit: Payer: Medicare Other | Admitting: Occupational Therapy

## 2023-05-15 ENCOUNTER — Ambulatory Visit: Payer: Medicare Other

## 2023-05-15 DIAGNOSIS — M6281 Muscle weakness (generalized): Secondary | ICD-10-CM

## 2023-05-15 DIAGNOSIS — R2681 Unsteadiness on feet: Secondary | ICD-10-CM

## 2023-05-15 DIAGNOSIS — R278 Other lack of coordination: Secondary | ICD-10-CM

## 2023-05-15 DIAGNOSIS — R2689 Other abnormalities of gait and mobility: Secondary | ICD-10-CM

## 2023-05-15 DIAGNOSIS — R262 Difficulty in walking, not elsewhere classified: Secondary | ICD-10-CM

## 2023-05-15 DIAGNOSIS — R293 Abnormal posture: Secondary | ICD-10-CM

## 2023-05-15 DIAGNOSIS — R41840 Attention and concentration deficit: Secondary | ICD-10-CM

## 2023-05-15 DIAGNOSIS — I69354 Hemiplegia and hemiparesis following cerebral infarction affecting left non-dominant side: Secondary | ICD-10-CM

## 2023-05-15 NOTE — Therapy (Signed)
OUTPATIENT OCCUPATIONAL THERAPY NEURO  Treatment Note  Patient Name: [name] MRN: [id number] DOB:10-09-1956, 67 y.o., male Today's Date: 05/15/2023  PCP: Katina Dung, Judie Petit REFERRING PROVIDER: Rolly Salter, MD  END OF SESSION:  OT End of Session - 05/15/23 1152     Visit Number 4    Number of Visits 7    Date for OT Re-Evaluation 06/07/23    Authorization Type Medicare A&B    OT Start Time 1101    OT Stop Time 1136    OT Time Calculation (min) 35 min    Activity Tolerance Patient tolerated treatment well;No increased pain;Patient limited by fatigue    Behavior During Therapy Flat affect;WFL for tasks assessed/performed;Impulsive              Past Medical History:  Diagnosis Date   Barrett esophagus    DM II (diabetes mellitus, type II), controlled (HCC) 11/13/2020   Dysphagia    Gastroparesis    GERD (gastroesophageal reflux disease)    Hypercholesteremia    Hypertension    Iron deficiency anemia    Mental retardation    Schizophrenia (HCC)    Seizure (HCC)    Stroke [facility])    Past Surgical History:  Procedure Laterality Date   BIOPSY  10/21/2020   Procedure: BIOPSY;  Surgeon: Iva Boop, MD;  Location: [facility] ENDOSCOPY;  Service: Endoscopy;;   CRANIOTOMY FOR CYST FENESTRATION     ESOPHAGOGASTRODUODENOSCOPY N/A 01/09/2015   Procedure: ESOPHAGOGASTRODUODENOSCOPY (EGD);  Surgeon: Louis Meckel, MD;  Location: [facility] ENDOSCOPY;  Service: Endoscopy;  Laterality: N/A;   ESOPHAGOGASTRODUODENOSCOPY N/A 10/26/2020   Procedure: ESOPHAGOGASTRODUODENOSCOPY (EGD);  Surgeon: Corliss Skains, MD;  Location: [facility] OR;  Service: Thoracic;  Laterality: N/A;   ESOPHAGOGASTRODUODENOSCOPY (EGD) WITH PROPOFOL N/A 10/21/2020   Procedure: ESOPHAGOGASTRODUODENOSCOPY (EGD) WITH PROPOFOL;  Surgeon: Iva Boop, MD;  Location: [facility] ENDOSCOPY;  Service: Endoscopy;  Laterality: N/A;   IR GASTR TUBE CONVERT GASTR-JEJ PER W/FL MOD SED  11/07/2020   PEG PLACEMENT N/A  10/26/2020   Procedure: PERCUTANEOUS ENDOSCOPIC GASTROSTOMY (PEG) PLACEMENT;  Surgeon: Corliss Skains, MD;  Location: [facility] OR;  Service: Thoracic;  Laterality: N/A;   XI ROBOTIC ASSISTED HIATAL HERNIA REPAIR N/A 10/26/2020   Procedure: XI ROBOTIC ASSISTED HIATAL HERNIA REPAIR;  Surgeon: Corliss Skains, MD;  Location: [facility] OR;  Service: Thoracic;  Laterality: N/A;   Patient Active Problem List   Diagnosis Date Noted   Breakthrough seizure (HCC) 04/11/2023   General weakness 04/09/2023   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) 10/26/2021   Seizure disorder (HCC) 10/26/2021   S/P craniotomy 03/09/2021   Dilation of esophagus    Hyponatremia    Dysphagia    Seizures (HCC)    S/P percutaneous endoscopic gastrostomy (PEG) tube placement (HCC)    Weakness 11/15/2020   DM II (diabetes mellitus, type II), controlled (HCC) 11/13/2020   Bowel obstruction (HCC)    S/P repair of paraesophageal hernia 10/26/2020   Syncope 10/21/2020   Hiatal hernia 10/21/2020   Acute hypoxemic respiratory failure (HCC) 10/21/2020   Sinus tachycardia 10/21/2020   Gastric polyps    Altered mental status 09/05/2020   Emesis 07/24/2015   Coffee ground emesis    Gastrointestinal hemorrhage associated with other gastritis    Acute esophagitis 01/09/2015   Upper gastrointestinal bleed    Diarrhea    Nausea with vomiting    GI bleed 01/08/2015   GIB (gastrointestinal bleeding) 01/08/2015  Nausea & vomiting 01/08/2015   Reflux esophagitis 07/04/2009   Loss of weight 07/04/2009   BLOOD IN STOOL, OCCULT 07/04/2009   ANEMIA, IRON DEFICIENCY 05/04/2008   Hypothyroidism 04/05/2008   Schizophrenia, unspecified type (HCC) 04/05/2008   Depression 04/05/2008   Hemiplegia (HCC) 04/05/2008   Essential hypertension 04/05/2008   GERD 04/05/2008   Gastroparesis 04/05/2008   Seizure (HCC) 04/05/2008   History of diverticulosis 08/21/2004   Barrett's esophagus  04/01/2002   HIATAL HERNIA 04/01/2002    ONSET DATE: 04/09/23  REFERRING DIAG: R53.1 (ICD-10-CM) - Left-sided weakness G40.919 (ICD-10-CM) - Breakthrough seizure  THERAPY DIAG:  Hemiplegia and hemiparesis following cerebral infarction affecting left non-dominant side (HCC)  Abnormal posture  Attention and concentration deficit  Other lack of coordination  Muscle weakness (generalized)  Rationale for Evaluation and Treatment: Rehabilitation  PERTINENT HISTORY: PMH: GERD, Barrett esophagus, gastroparesis, HTN, CVA (large R frontal) with L sided deficits, mental retardation, schizophrenia, hypothyroidism, seizures, benign intracranial tumor s/p craniotomy 2004, dysphagia, HLD "Pt states that his LUE is weak.  Pt reports that he uses a walker and w/c for mobility.  Pt with inconsistent report on PLOF and current status, therefore caregiver present for portion of session to address current status and PLOF.  Pt's caregiver reports that pt's strength is weaker on the L side and that his cognition is different.  Caregiver reports that pt is w/c level since hospitalization and requires +2 for all transfers and self-care tasks."  PRECAUTIONS: Other: seizures; WEIGHT BEARING RESTRICTIONS: No   SUBJECTIVE:   SUBJECTIVE STATEMENT: Pt's caregiver reports that he is now only requiring one person assist with self-care tasks.    Pt accompanied by: self and caregiver from group home   PAIN:  Are you having pain? No   FALLS: Has patient fallen in last 6 months? No  LIVING ENVIRONMENT: Lives with: lives in an adult home Lives in: House/apartment Stairs: No Has following equipment at home: Dan Humphreys - 4 wheeled, Wheelchair (power), shower chair, and Grab bars  PLOF:  Mod I for toileting, but Min-mod assist for ADLs.  Utilized Rollator for mobility prior to most recent hospitalization  PATIENT GOALS: to have a long life   OBJECTIVE: (All objective assessments below are from initial  evaluation on: 04/25/23 unless otherwise specified.)   HAND DOMINANCE: Right  ADLs: Overall ADLs: +2 assist for all mobility and self-care tasks. +2 for transfers into shower, utilizing shower chair Transfers/ambulation related to ADLs: +2 assist Eating: setup to open containers, but then pt able to feed himself Grooming: total assist UB Dressing: total assist LB Dressing: +2 assist Toileting: +2 assist Bathing: +2 assist Tub Shower transfers: +2 assist, handicap accessible shower with seat Equipment: Shower seat with back and Walk in shower   MOBILITY STATUS:  utilizing w/c at this point for mobility  POSTURE COMMENTS:  rounded shoulders, forward head, and posterior pelvic tilt   UPPER EXTREMITY ROM:  AROM: Pt initially able to lift arm ~20-30% from arm rest, utilizing abduction to achieve ~60* shoulder flexion; however when directed to move to assess ROM pt stating no.  OT attempted to explain purpose of therapy and evaluation to assess ROM, however pt continuing to shake head and state no to further attempts at AROM.  Did require max encouragement to assess PROM.  Passive ROM Left eval  Shoulder flexion 30*  Shoulder abduction 80*  Shoulder adduction   Shoulder extension   Shoulder internal rotation   Shoulder external rotation   Elbow flexion WNL  Elbow extension WNL  Wrist flexion 50%  Wrist extension 25%  Wrist ulnar deviation   Wrist radial deviation   Wrist pronation   Wrist supination   (Blank rows = not tested)   HAND FUNCTION: Unable to assess as pt keeping hand in partial fist.  Pt is able to open digits fully, except for thumb, with increased time and demonstration  COORDINATION: Pt would not attempt to pick any items up with L hand despite multiple attempts and multiple items  SENSATION: Inconsistent report.  Pt reports both hands feel the same, but also reports numbness on L side since stroke  COGNITION: Overall cognitive status: History of cognitive  impairments - at baseline  VISION: Subjective report: unable to assess due to inconsistent report, cognition impacting ability to attend and participate   OBSERVATIONS: Pt with inconsistent report and frequently shaking head when therapist encouraging attempts at AROM for LUE.  Pt did lift LUE spontaneously when attempting to reach towards RW during PT session (OT present as +2 initially).     TODAY'S TREATMENT:                                                                       05/15/23 UE Ranger: engaged in shoulder flexion with use of UE Ranger.  OT providing support at elbow and under UE ranger hand attachment to facilitate straight trajectory.  Utilized [employer] for [employer] and to facilitate increased engagement.  Pt tolerating shoulder flexion to ~90*.  OT attempted to facilitate increased shoulder flexion with overhead reach with UE Ranger, however unable to tolerate increased wrist extension on hand attachment. Shoulder flexion: OT transitioned away from UE ranger to further attempt shoulder flexion. With attempts at AROM, pt recruiting abduction and with inability to complete true shoulder flexion.  OT then providing support at elbow and wrist to facilitate shoulder flexion.  Pt with increased tightness approaching 60-70* shoulder flexion, tolerating up to 90*.  OT educated caregiver on hand placement to facilitate ROM.  Exercises - Seated Shoulder Flexion PROM with Caregiver  - 1-2 x daily - 2 sets - 5 reps - Seated Shoulder External Rotation and Internal Rotation PROM with Caregiver  - 1-2 x daily - 2 sets - 5-10 reps   05/08/23: He is quiet, slightly guarded today, and is brief in speech but quick to act (impulsive at times). He was lead through several activities to work on rehabilitating his left upper extremity, functional coordination, etc., by performing bimanual tasks like lacing, catch and throw, dowel AAROM  in a mirror for visual feedback. He was also supplied with other,  printed off activities that he can try at home as well ("rock the baby," reach down pant legs, pointing, "wood chopping").  These were all done with him and his facial expression shows that he is giving considerable effort to help accomplish. His arm is overtly spastic and tight, but does work and contribute toward work today.  OT asks for permission to touch his arm to help him stretch and move, but he states "no" and OT resects that- he focuses on trying his best. Caregiver also educated. No pain at end, though he visibly looks fatigued, leaves with caregiver in w/c.      05/01/23  Towel slides: OT educated pt on shoulder flexion/extension and horizontal abduction/adduction on table top.  OT facilitating motion with support at elbow encouraging pt to utilize hand over hand positioning to allow for increased ROM.  Pt benefiting from cues to attend to LUE for increased ROM and quality of movement. Self-ROM: attempted shoulder shrugs with support of RUE under L arm to facilitate increased ROM, however pt frequently just leaning forward.  OT attempted to provide hand over hand and facilitation, however no carryover.  Transitioned to shoulder flexion with RUE supporting LUE.  OT providing assistance to set up position and demonstration and cues to facilitate "push up" instead of "pulling up" on LUE to prevent injury.  Attempted to instruct in horizontal abduction, however pt stating "I refuse".  OT attempted to redirect and provide alternative instructions, however pt continuing to state that he refused to continue.  Pt did agree to having therapist educate caregiver on exercises completed to this point.  PATIENT EDUCATION: Education details: ongoing condition specific education. Person educated: Patient Education method: Explanation Education comprehension: needs further education  HOME EXERCISE PROGRAM: Self-ROM (see pt instructions)  Access Code: [email or web address] URL:  [email or web address] Date: 05/15/2023 Prepared by: [facility] - Outpatient  Rehab - [facility]   GOALS: Goals reviewed with patient? Yes  SHORT TERM GOALS: Target date: 05/17/23  Pt and caregiver will be independent with simple HEP and functional activities to encourage simple assist from LUE and to increase awareness/attention of LUE. Baseline: Goal status: MET - 05/15/23  2.  Pt will tolerate standing with supervision to allow caregiver to complete clothing management/hygiene for toileting. Baseline:  Goal status: IN PROGRESS  3.  Pt will be able to complete UB dressing with min assist and min cues for hemi-technique. Baseline:  Goal status: IN PROGRESS   LONG TERM GOALS: Target date: 06/07/23  Pt will complete toilet transfer, stand pivot, with min assist of one caregiver. Baseline:  Goal status: IN PROGRESS  2.  Pt will assist with bathing to complete 50%of task, incorporating use of LUE at gross assist level and with use of AE prn  Baseline:  Goal status: IN PROGRESS  3.  Pt will complete clothing management post toileting with min assist of one caregiver for safety and sequencing. Baseline:  Goal status: IN PROGRESS  4.  Pt will be able to complete LB dressing with mod assist and min cues for hemi-technique. Baseline:  Goal status: IN PROGRESS  ASSESSMENT:  CLINICAL IMPRESSION: Attempted to begin session with bimanual tasks as completed during previous session, however met with resistance from pt.  OT presented novel tool, UE Ranger, with pt showing interest.  Therefore engaged in shoulder flexion and chest press with UE Ranger for self-ROM.  Pt's caregiver asking questions about safety with removal of arm rest for exercises and to allow improved shoulder position as pt frequently with LUE abducted to place on arm rest.  Discussed periodic stretch break when sitting in w/c for prolonged periods as pt unsafe to leave upright in w/c with arm rest  removed due to impulsivity and decreased safety awareness.   PLAN:  OT FREQUENCY: 1x/week  OT DURATION: 6 weeks  PLANNED INTERVENTIONS: self care/ADL training, therapeutic exercise, therapeutic activity, neuromuscular re-education, manual therapy, passive range of motion, balance training, functional mobility training, splinting, ultrasound, paraffin, fluidotherapy, compression bandaging, moist heat, cryotherapy, patient/family education, cognitive remediation/compensation, visual/perceptual remediation/compensation, psychosocial skills training, coping strategies training, and DME and/or AE instructions  RECOMMENDED OTHER SERVICES: SLP eval  CONSULTED  AND AGREED WITH PLAN OF CARE: Patient  PLAN FOR NEXT SESSION:  Continue working on exercises and activities to help encourage functional use of the left arm.  Try bimanual tasks- tasks that occupy the right arm and hand therefore making the left arm and hand have to work. Make it fun and allow for breaks. He has decreased tolerance to being touched or manual/PROM therapy, however tolerated this session.   Rosalio Loud, OTR/L 05/15/2023, 11:53 AM

## 2023-05-15 NOTE — Therapy (Signed)
OUTPATIENT PHYSICAL THERAPY NEURO TREATMENT   Patient Name: [name] MRN: [id number] DOB:1956/05/29, 67 y.o., male Today's Date: 05/15/2023   PCP: Lucretia Field  REFERRING PROVIDER: Rolly Salter, MD  END OF SESSION:  PT End of Session - 05/15/23 1018     Visit Number 4    Number of Visits 7    Date for PT Re-Evaluation 06/06/23    Authorization Type Medicare/Medicaid    PT Start Time 1015    PT Stop Time 1100    PT Time Calculation (min) 45 min    Equipment Utilized During Treatment Gait belt    Activity Tolerance Patient tolerated treatment well    Behavior During Therapy Flat affect;Impulsive              Past Medical History:  Diagnosis Date   Barrett esophagus    DM II (diabetes mellitus, type II), controlled (HCC) 11/13/2020   Dysphagia    Gastroparesis    GERD (gastroesophageal reflux disease)    Hypercholesteremia    Hypertension    Iron deficiency anemia    Mental retardation    Schizophrenia (HCC)    Seizure (HCC)    Stroke [facility])    Past Surgical History:  Procedure Laterality Date   BIOPSY  10/21/2020   Procedure: BIOPSY;  Surgeon: Iva Boop, MD;  Location: [facility] ENDOSCOPY;  Service: Endoscopy;;   CRANIOTOMY FOR CYST FENESTRATION     ESOPHAGOGASTRODUODENOSCOPY N/A 01/09/2015   Procedure: ESOPHAGOGASTRODUODENOSCOPY (EGD);  Surgeon: Louis Meckel, MD;  Location: [facility] ENDOSCOPY;  Service: Endoscopy;  Laterality: N/A;   ESOPHAGOGASTRODUODENOSCOPY N/A 10/26/2020   Procedure: ESOPHAGOGASTRODUODENOSCOPY (EGD);  Surgeon: Corliss Skains, MD;  Location: [facility] OR;  Service: Thoracic;  Laterality: N/A;   ESOPHAGOGASTRODUODENOSCOPY (EGD) WITH PROPOFOL N/A 10/21/2020   Procedure: ESOPHAGOGASTRODUODENOSCOPY (EGD) WITH PROPOFOL;  Surgeon: Iva Boop, MD;  Location: [facility] ENDOSCOPY;  Service: Endoscopy;  Laterality: N/A;   IR GASTR TUBE CONVERT GASTR-JEJ PER W/FL MOD SED  11/07/2020   PEG PLACEMENT N/A 10/26/2020   Procedure: PERCUTANEOUS  ENDOSCOPIC GASTROSTOMY (PEG) PLACEMENT;  Surgeon: Corliss Skains, MD;  Location: [facility] OR;  Service: Thoracic;  Laterality: N/A;   XI ROBOTIC ASSISTED HIATAL HERNIA REPAIR N/A 10/26/2020   Procedure: XI ROBOTIC ASSISTED HIATAL HERNIA REPAIR;  Surgeon: Corliss Skains, MD;  Location: [facility] OR;  Service: Thoracic;  Laterality: N/A;   Patient Active Problem List   Diagnosis Date Noted   Breakthrough seizure (HCC) 04/11/2023   General weakness 04/09/2023   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) 10/26/2021   Seizure disorder (HCC) 10/26/2021   S/P craniotomy 03/09/2021   Dilation of esophagus    Hyponatremia    Dysphagia    Seizures (HCC)    S/P percutaneous endoscopic gastrostomy (PEG) tube placement (HCC)    Weakness 11/15/2020   DM II (diabetes mellitus, type II), controlled (HCC) 11/13/2020   Bowel obstruction (HCC)    S/P repair of paraesophageal hernia 10/26/2020   Syncope 10/21/2020   Hiatal hernia 10/21/2020   Acute hypoxemic respiratory failure (HCC) 10/21/2020   Sinus tachycardia 10/21/2020   Gastric polyps    Altered mental status 09/05/2020   Emesis 07/24/2015   Coffee ground emesis    Gastrointestinal hemorrhage associated with other gastritis    Acute esophagitis 01/09/2015   Upper gastrointestinal bleed    Diarrhea    Nausea with vomiting    GI bleed 01/08/2015   GIB (gastrointestinal bleeding) 01/08/2015  Nausea & vomiting 01/08/2015   Reflux esophagitis 07/04/2009   Loss of weight 07/04/2009   BLOOD IN STOOL, OCCULT 07/04/2009   ANEMIA, IRON DEFICIENCY 05/04/2008   Hypothyroidism 04/05/2008   Schizophrenia, unspecified type (HCC) 04/05/2008   Depression 04/05/2008   Hemiplegia (HCC) 04/05/2008   Essential hypertension 04/05/2008   GERD 04/05/2008   Gastroparesis 04/05/2008   Seizure (HCC) 04/05/2008   History of diverticulosis 08/21/2004   Barrett's esophagus 04/01/2002   HIATAL HERNIA 04/01/2002     ONSET DATE: 04/09/23  REFERRING DIAG: R53.1 (ICD-10-CM) - Left-sided weakness G40.919 (ICD-10-CM) - Breakthrough seizure (HCC)  THERAPY DIAG:  Muscle weakness (generalized)  Unsteadiness on feet  Other abnormalities of gait and mobility  Hemiplegia and hemiparesis following cerebral infarction affecting left non-dominant side (HCC)  Difficulty in walking, not elsewhere classified  Abnormal posture  Rationale for Evaluation and Treatment: Rehabilitation  SUBJECTIVE:                                                                                                                                                                                             SUBJECTIVE STATEMENT: Doing well, no new issues to report. Wheelchair walking around the group home  Pt accompanied by: self, caregiver  PERTINENT HISTORY: DMII, GERD, HLD, HTN, anemia, schizophrenia, seizure, stroke with L hemiparesis, benign intracranial tumor s/p craniotomy 2004, dysphagia, cognitive deficit   PAIN:  Are you having pain? No  PRECAUTIONS: Fall and Other: seizures  WEIGHT BEARING RESTRICTIONS: No  FALLS: Has patient fallen in last 6 months? No  LIVING ENVIRONMENT: Per OT's note as caregiver not present for PT session:  Lives with: lives in an adult home Lives in: House/apartment Stairs: No Has following equipment at home: Dan Humphreys - 4 wheeled, Wheelchair (power), shower chair, and Grab bars   PLOF:  Mod I for toileting, but Min-mod assist for ADLs.  Utilized Rollator for mobility prior to most recent hospitalization  PATIENT GOALS: pt agrees that he wants to work on moving better   OBJECTIVE:    TODAY'S TREATMENT: 05/15/23 Activity Comments  Seated AP and LAQ 2x10   Sit-stand in parallel bars 1x10 Stride stance for LLE bias--physical blocking to maintain position  Gait training -forwards/backwards trials in // bars to improve LLE weight acceptance -training w/ left platform RW and min-mod A for  control, coordination, and pacing x 45 ft increments  Transfer training -techniques to improve safety and sequence with stand-pivot transfers             HOME EXERCISE PROGRAM: Access Code: [email or web address] URL: [email or web address] Date: 04/25/2023 Prepared by: [facility] - Outpatient  Rehab - [facility]  Exercises - Seated March  - 1 x daily - 5 x weekly - 2 sets - 10 reps - Seated Long Arc Quad  - 1 x daily - 5 x weekly - 2 sets - 10 reps - Seated Hip Adduction Isometrics with Ball  - 1 x daily - 5 x weekly - 2 sets - 10 reps - 3 sec hold - Sit to Stand with Counter Support  - 1 x daily - 5 x weekly - 2 sets - 10 reps  PATIENT EDUCATION: Caregiver/patient educated in use of platform and requisite safety to prepare for home use  Below measures were taken at time of initial evaluation unless otherwise specified:   DIAGNOSTIC FINDINGS: 04/10/23 brain MRI: No acute intracranial process. Unchanged resection cavity with surrounding encephalomalacia and gliosis in the right frontal lobe.  COGNITION: Overall cognitive status: History of cognitive impairments - at baseline   SENSATION: Intact in B Les to light touch  COORDINATION:  Alternating toe tap: pt lifting R and L LE with hip flexors rather than DF; limited motion on L  MUSCLE TONE: appears intact in L LE but limited by cognition/understanding on R LE d/t tendency to try to perform AROM  POSTURE: posterior pelvic tilt and rounded spine in w/c, head bend downward and to the R  LOWER EXTREMITY MMT:     MMT Right Eval Left Eval  Hip flexion 4 3  Hip extension    Hip abduction 4 2  Hip adduction 4 2  Hip internal rotation    Hip external rotation    Knee flexion 4+ 1  Knee extension 4+ 4-  Ankle dorsiflexion 4 0 (unable to elicit contraction but unsure if limited by cognition)  Ankle plantarflexion 4 0  (unable to elicit contraction but unsure if limited by cognition)  Ankle inversion    Ankle  eversion     (Blank rows = not tested)  LOWER EXTREMITY MMT:    AROM Right Eval Left Eval  Hip flexion    Hip extension    Hip abduction    Hip adduction    Hip internal rotation    Hip external rotation    Knee flexion    Knee extension    Ankle dorsiflexion 6 -15  Ankle plantarflexion    Ankle inversion    Ankle eversion    (Blank rows = not tested)  TRANSFERS: Assistive device utilized: Environmental consultant - 2 wheeled  Sit to stand: Min A and x2 Stand to sit: Min A and x2 Pt able to perform marching with R LE but unable to move L LE; required assist in managing L LE on RW   GAIT: Gait pattern:  pt unable today Trialed self-w/c propulsion with R UE but with limited ability and path deviation        GOALS: Goals reviewed with patient? Yes  SHORT TERM GOALS: Target date: 05/16/2023  Patient to perform initial HEP with caregiver assist. Baseline: HEP initiated Goal status: IN PROGRESS    LONG TERM GOALS: Target date: 06/06/2023  Patient to perform advanced HEP with caregiver assist. Baseline: Not yet initiated  Goal status: IN PROGRESS  Patient to demonstrate L LE strength >/=3/5.  Baseline: See above Goal status: IN PROGRESS  Patient to demonstrate improved head, pelvic, and spinal position in w/c,  Baseline: rounded posture, head drooping down Goal status: IN PROGRESS  Patient to perform sit to stand transfer with supervision and LRAD.   Baseline: min A x 2 with  RW Goal status: IN PROGRESS  Patient to ambulate 30 feet with LRAD and CGA.  Baseline: unable Goal status: IN PROGRESS   ASSESSMENT:  CLINICAL IMPRESSION: Caregiver reports improved performance in functional mobility as now able to perform transfers at home one-assist vs two.  Training today with emphasis on improved independence and safety with transfers and initiating gait training.  Poor impulse control and decr safety awareness requiring cga-min A for cues and guarding with pivoting transfers.  Gait training initiated w/ left platform RW and min-mod A to maintain stability and cues in sequence to facilitate LLE stance phase control and heel strike initial contact and manage safety with turns.  One trial with left PLS AFO which improved heel strike contact. Onset of fatigue after 45 ft with seated rest periods between trials. Continued sessions to improve safety and independence with mobility to reduce risk for falls.    OBJECTIVE IMPAIRMENTS: Abnormal gait, decreased activity tolerance, decreased balance, decreased mobility, difficulty walking, decreased ROM, decreased strength, impaired flexibility, improper body mechanics, and postural dysfunction.   ACTIVITY LIMITATIONS: carrying, lifting, bending, sitting, standing, squatting, stairs, transfers, bed mobility, bathing, toileting, dressing, reach over head, hygiene/grooming, and locomotion level  PARTICIPATION LIMITATIONS: community activity and church  PERSONAL FACTORS: Age, Behavior pattern, Past/current experiences, Time since onset of injury/illness/exacerbation, Transportation, and 3+ comorbidities: DMII, GERD, HLD, HTN, anemia, schizophrenia, seizure, stroke with L hemiparesis, benign intracranial tumor s/p craniotomy 2004, dysphagia  are also affecting patient's functional outcome.   REHAB POTENTIAL: Good  CLINICAL DECISION MAKING: Evolving/moderate complexity  EVALUATION COMPLEXITY: Moderate  PLAN:  PT FREQUENCY: 1x/week  PT DURATION: 6 weeks  PLANNED INTERVENTIONS: Therapeutic exercises, Therapeutic activity, Neuromuscular re-education, Balance training, Gait training, Patient/Family education, Self Care, Joint mobilization, Stair training, Vestibular training, Canalith repositioning, Orthotic/Fit training, DME instructions, Aquatic Therapy, Dry Needling, Electrical stimulation, Cryotherapy, Moist heat, Taping, Manual therapy, and Re-evaluation  PLAN FOR NEXT SESSION: Continue STS transfers at RW or parallel bars, trial  standing marching, wt shifting and encourage use of L LE    11:12 AM, 05/15/23 M. Shary Decamp, PT, DPT Physical Therapist- [place] Office Number: [phone]

## 2023-05-22 ENCOUNTER — Ambulatory Visit: Payer: Medicare Other | Admitting: Physical Therapy

## 2023-05-22 ENCOUNTER — Encounter: Payer: Self-pay | Admitting: Physical Therapy

## 2023-05-22 ENCOUNTER — Ambulatory Visit: Payer: Medicare Other | Admitting: Occupational Therapy

## 2023-05-22 DIAGNOSIS — R278 Other lack of coordination: Secondary | ICD-10-CM

## 2023-05-22 DIAGNOSIS — R2681 Unsteadiness on feet: Secondary | ICD-10-CM

## 2023-05-22 DIAGNOSIS — R293 Abnormal posture: Secondary | ICD-10-CM

## 2023-05-22 DIAGNOSIS — R2689 Other abnormalities of gait and mobility: Secondary | ICD-10-CM

## 2023-05-22 DIAGNOSIS — I69354 Hemiplegia and hemiparesis following cerebral infarction affecting left non-dominant side: Secondary | ICD-10-CM

## 2023-05-22 DIAGNOSIS — R41840 Attention and concentration deficit: Secondary | ICD-10-CM

## 2023-05-22 DIAGNOSIS — M6281 Muscle weakness (generalized): Secondary | ICD-10-CM

## 2023-05-22 NOTE — Therapy (Signed)
OUTPATIENT OCCUPATIONAL THERAPY NEURO  Treatment Note  Patient Name: [name] MRN: [id number] DOB:[birth year], 67 y.o., male Today's Date: 05/22/2023  PCP: Katina Dung, Judie Petit REFERRING PROVIDER: Rolly Salter, MD  END OF SESSION:  OT End of Session - 05/22/23 1229     Visit Number 5    Number of Visits 7    Date for OT Re-Evaluation 06/07/23    Authorization Type Medicare A&B    OT Start Time 1148    OT Stop Time 1220    OT Time Calculation (min) 32 min    Activity Tolerance Patient tolerated treatment well;No increased pain;Patient limited by fatigue    Behavior During Therapy Flat affect;WFL for tasks assessed/performed;Impulsive               Past Medical History:  Diagnosis Date   Barrett esophagus    DM II (diabetes mellitus, type II), controlled (HCC) 11/13/2020   Dysphagia    Gastroparesis    GERD (gastroesophageal reflux disease)    Hypercholesteremia    Hypertension    Iron deficiency anemia    Mental retardation    Schizophrenia (HCC)    Seizure (HCC)    Stroke [facility])    Past Surgical History:  Procedure Laterality Date   BIOPSY  10/21/2020   Procedure: BIOPSY;  Surgeon: Iva Boop, MD;  Location: [facility] ENDOSCOPY;  Service: Endoscopy;;   CRANIOTOMY FOR CYST FENESTRATION     ESOPHAGOGASTRODUODENOSCOPY N/A 01/09/2015   Procedure: ESOPHAGOGASTRODUODENOSCOPY (EGD);  Surgeon: Louis Meckel, MD;  Location: [facility] ENDOSCOPY;  Service: Endoscopy;  Laterality: N/A;   ESOPHAGOGASTRODUODENOSCOPY N/A 10/26/2020   Procedure: ESOPHAGOGASTRODUODENOSCOPY (EGD);  Surgeon: Corliss Skains, MD;  Location: [facility] OR;  Service: Thoracic;  Laterality: N/A;   ESOPHAGOGASTRODUODENOSCOPY (EGD) WITH PROPOFOL N/A 10/21/2020   Procedure: ESOPHAGOGASTRODUODENOSCOPY (EGD) WITH PROPOFOL;  Surgeon: Iva Boop, MD;  Location: [facility] ENDOSCOPY;  Service: Endoscopy;  Laterality: N/A;   IR GASTR TUBE CONVERT GASTR-JEJ PER W/FL MOD SED  11/07/2020   PEG PLACEMENT N/A  10/26/2020   Procedure: PERCUTANEOUS ENDOSCOPIC GASTROSTOMY (PEG) PLACEMENT;  Surgeon: Corliss Skains, MD;  Location: [facility] OR;  Service: Thoracic;  Laterality: N/A;   XI ROBOTIC ASSISTED HIATAL HERNIA REPAIR N/A 10/26/2020   Procedure: XI ROBOTIC ASSISTED HIATAL HERNIA REPAIR;  Surgeon: Corliss Skains, MD;  Location: [facility] OR;  Service: Thoracic;  Laterality: N/A;   Patient Active Problem List   Diagnosis Date Noted   Breakthrough seizure (HCC) 04/11/2023   General weakness 04/09/2023   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) 10/26/2021   Seizure disorder (HCC) 10/26/2021   S/P craniotomy 03/09/2021   Dilation of esophagus    Hyponatremia    Dysphagia    Seizures (HCC)    S/P percutaneous endoscopic gastrostomy (PEG) tube placement (HCC)    Weakness 11/15/2020   DM II (diabetes mellitus, type II), controlled (HCC) 11/13/2020   Bowel obstruction (HCC)    S/P repair of paraesophageal hernia 10/26/2020   Syncope 10/21/2020   Hiatal hernia 10/21/2020   Acute hypoxemic respiratory failure (HCC) 10/21/2020   Sinus tachycardia 10/21/2020   Gastric polyps    Altered mental status 09/05/2020   Emesis 07/24/2015   Coffee ground emesis    Gastrointestinal hemorrhage associated with other gastritis    Acute esophagitis 01/09/2015   Upper gastrointestinal bleed    Diarrhea    Nausea with vomiting    GI bleed 01/08/2015   GIB (gastrointestinal bleeding) 01/08/2015  Nausea & vomiting 01/08/2015   Reflux esophagitis 07/04/2009   Loss of weight 07/04/2009   BLOOD IN STOOL, OCCULT 07/04/2009   ANEMIA, IRON DEFICIENCY 05/04/2008   Hypothyroidism 04/05/2008   Schizophrenia, unspecified type (HCC) 04/05/2008   Depression 04/05/2008   Hemiplegia (HCC) 04/05/2008   Essential hypertension 04/05/2008   GERD 04/05/2008   Gastroparesis 04/05/2008   Seizure (HCC) 04/05/2008   History of diverticulosis 08/21/2004   Barrett's esophagus  04/01/2002   HIATAL HERNIA 04/01/2002    ONSET DATE: 04/09/23  REFERRING DIAG: R53.1 (ICD-10-CM) - Left-sided weakness G40.919 (ICD-10-CM) - Breakthrough seizure  THERAPY DIAG:  Hemiplegia and hemiparesis following cerebral infarction affecting left non-dominant side (HCC)  Abnormal posture  Attention and concentration deficit  Other lack of coordination  Muscle weakness (generalized)  Rationale for Evaluation and Treatment: Rehabilitation  PERTINENT HISTORY: PMH: GERD, Barrett esophagus, gastroparesis, HTN, CVA (large R frontal) with L sided deficits, mental retardation, schizophrenia, hypothyroidism, seizures, benign intracranial tumor s/p craniotomy 2004, dysphagia, HLD "Pt states that his LUE is weak.  Pt reports that he uses a walker and w/c for mobility.  Pt with inconsistent report on PLOF and current status, therefore caregiver present for portion of session to address current status and PLOF.  Pt's caregiver reports that pt's strength is weaker on the L side and that his cognition is different.  Caregiver reports that pt is w/c level since hospitalization and requires +2 for all transfers and self-care tasks."  PRECAUTIONS: Other: seizures;  WEIGHT BEARING RESTRICTIONS: No   SUBJECTIVE:   SUBJECTIVE STATEMENT: Pt reports that he is able to don shirt independently.  Pt accompanied by: self and caregiver from group home   PAIN:  Are you having pain? No   FALLS: Has patient fallen in last 6 months? No  LIVING ENVIRONMENT: Lives with: lives in an adult home Lives in: House/apartment Stairs: No Has following equipment at home: Dan Humphreys - 4 wheeled, Wheelchair (power), shower chair, and Grab bars  PLOF:  Mod I for toileting, but Min-mod assist for ADLs.  Utilized Rollator for mobility prior to most recent hospitalization  PATIENT GOALS: to have a long life   OBJECTIVE: (All objective assessments below are from initial evaluation on: 04/25/23 unless otherwise  specified.)   HAND DOMINANCE: Right  ADLs: Overall ADLs: +2 assist for all mobility and self-care tasks. +2 for transfers into shower, utilizing shower chair Transfers/ambulation related to ADLs: +2 assist Eating: setup to open containers, but then pt able to feed himself Grooming: total assist UB Dressing: total assist LB Dressing: +2 assist Toileting: +2 assist Bathing: +2 assist Tub Shower transfers: +2 assist, handicap accessible shower with seat Equipment: Shower seat with back and Walk in shower   MOBILITY STATUS:  utilizing w/c at this point for mobility  POSTURE COMMENTS:  rounded shoulders, forward head, and posterior pelvic tilt   UPPER EXTREMITY ROM:  AROM: Pt initially able to lift arm ~20-30% from arm rest, utilizing abduction to achieve ~60* shoulder flexion; however when directed to move to assess ROM pt stating no.  OT attempted to explain purpose of therapy and evaluation to assess ROM, however pt continuing to shake head and state no to further attempts at AROM.  Did require max encouragement to assess PROM.  Passive ROM Left eval  Shoulder flexion 30*  Shoulder abduction 80*  Shoulder adduction   Shoulder extension   Shoulder internal rotation   Shoulder external rotation   Elbow flexion WNL  Elbow extension WNL  Wrist flexion  50%  Wrist extension 25%  Wrist ulnar deviation   Wrist radial deviation   Wrist pronation   Wrist supination   (Blank rows = not tested)   HAND FUNCTION: Unable to assess as pt keeping hand in partial fist.  Pt is able to open digits fully, except for thumb, with increased time and demonstration  COORDINATION: Pt would not attempt to pick any items up with L hand despite multiple attempts and multiple items  SENSATION: Inconsistent report.  Pt reports both hands feel the same, but also reports numbness on L side since stroke  COGNITION: Overall cognitive status: History of cognitive impairments - at  baseline  VISION: Subjective report: unable to assess due to inconsistent report, cognition impacting ability to attend and participate   OBSERVATIONS: Pt with inconsistent report and frequently shaking head when therapist encouraging attempts at AROM for LUE.  Pt did lift LUE spontaneously when attempting to reach towards RW during PT session (OT present as +2 initially).     TODAY'S TREATMENT:                                                                       05/22/23 UB dressing: OT demonstrating with use of gait belt and providing with handout for hemi-dressing technique.  Pt attempting to complete with gait belt to simulate dressing, however with decreased carryover and rushing through steps causing pt to get caught up in gait belt.  OT provided pt with XL t-shirt with pt demonstrating improved sequencing and slowing down to allow for improved independence with donning shift.  Provided handout for caregiver as new caregivers are onboarding at pt's group home.   LUE NMR: engaged in ball toss with dowel in BUE to bat back to therapist.  Pt benefiting from hand over hand on L side to facilitate increased ROM.      05/15/23 UE Ranger: engaged in shoulder flexion with use of UE Ranger.  OT providing support at elbow and under UE ranger hand attachment to facilitate straight trajectory.  Utilized [employer] for [employer] and to facilitate increased engagement.  Pt tolerating shoulder flexion to ~90*.  OT attempted to facilitate increased shoulder flexion with overhead reach with UE Ranger, however unable to tolerate increased wrist extension on hand attachment. Shoulder flexion: OT transitioned away from UE ranger to further attempt shoulder flexion. With attempts at AROM, pt recruiting abduction and with inability to complete true shoulder flexion.  OT then providing support at elbow and wrist to facilitate shoulder flexion.  Pt with increased tightness approaching 60-70* shoulder flexion,  tolerating up to 90*.  OT educated caregiver on hand placement to facilitate ROM.  Exercises - Seated Shoulder Flexion PROM with Caregiver  - 1-2 x daily - 2 sets - 5 reps - Seated Shoulder External Rotation and Internal Rotation PROM with Caregiver  - 1-2 x daily - 2 sets - 5-10 reps   05/08/23: He is quiet, slightly guarded today, and is brief in speech but quick to act (impulsive at times). He was lead through several activities to work on rehabilitating his left upper extremity, functional coordination, etc., by performing bimanual tasks like lacing, catch and throw, dowel AAROM  in a mirror for visual feedback. He was also  supplied with other, printed off activities that he can try at home as well ("rock the baby," reach down pant legs, pointing, "wood chopping").  These were all done with him and his facial expression shows that he is giving considerable effort to help accomplish. His arm is overtly spastic and tight, but does work and contribute toward work today.  OT asks for permission to touch his arm to help him stretch and move, but he states "no" and OT resects that- he focuses on trying his best. Caregiver also educated. No pain at end, though he visibly looks fatigued, leaves with caregiver in w/c.     PATIENT EDUCATION: Education details: ongoing condition specific education. Person educated: Patient Education method: Explanation Education comprehension: needs further education  HOME EXERCISE PROGRAM: Self-ROM (see pt instructions)  Access Code: [email or web address] URL: [email or web address] Date: 05/15/2023 Prepared by: [facility] - Outpatient  Rehab - [facility]   GOALS: Goals reviewed with patient? Yes  SHORT TERM GOALS: Target date: 05/17/23  Pt and caregiver will be independent with simple HEP and functional activities to encourage simple assist from LUE and to increase awareness/attention of LUE. Baseline: Goal status: MET - 05/15/23  2.  Pt will tolerate  standing with supervision to allow caregiver to complete clothing management/hygiene for toileting. Baseline:  Goal status: IN PROGRESS  3.  Pt will be able to complete UB dressing with min assist and min cues for hemi-technique. Baseline:  Goal status: MET - 05/22/23   LONG TERM GOALS: Target date: 06/07/23  Pt will complete toilet transfer, stand pivot, with min assist of one caregiver. Baseline:  Goal status: IN PROGRESS  2.  Pt will assist with bathing to complete 50%of task, incorporating use of LUE at gross assist level and with use of AE prn  Baseline:  Goal status: IN PROGRESS  3.  Pt will complete clothing management post toileting with min assist of one caregiver for safety and sequencing. Baseline:  Goal status: IN PROGRESS  4.  Pt will be able to complete LB dressing with mod assist and min cues for hemi-technique. Baseline:  Goal status: IN PROGRESS  ASSESSMENT:  CLINICAL IMPRESSION: Pt receptive to massed practice with UB dressing initially attempting with gait belt, however pt benefiting from more functional in nature with use of XL t-shirt for increased carryover.  Pt tolerating of cues and hand over hand assistance with simulated dressing tasks.  Engaged in LUE NMR with BUE on dowel to bat ball back to therapist.  Pt again tolerating hand over hand and facilitation for increased ROM of LUE.  Pt then with incoherent outburst requiring mod redirection to complete task.  Pt then with additional outburst and caregiver stating "pt's done when he gets like this".  Pt able to don jacket with min assist, asking therapist appropriately for assistance with bringing jacket around back.   PLAN:  OT FREQUENCY: 1x/week  OT DURATION: 6 weeks  PLANNED INTERVENTIONS: self care/ADL training, therapeutic exercise, therapeutic activity, neuromuscular re-education, manual therapy, passive range of motion, balance training, functional mobility training, splinting, ultrasound, paraffin,  fluidotherapy, compression bandaging, moist heat, cryotherapy, patient/family education, cognitive remediation/compensation, visual/perceptual remediation/compensation, psychosocial skills training, coping strategies training, and DME and/or AE instructions  RECOMMENDED OTHER SERVICES: SLP eval  CONSULTED AND AGREED WITH PLAN OF CARE: Patient  PLAN FOR NEXT SESSION:  Continue working on exercises and activities to help encourage functional use of the left arm.  Try bimanual tasks- tasks that occupy the right arm and hand therefore  making the left arm and hand have to work. Make it fun and allow for breaks. He has decreased tolerance to being touched or manual/PROM therapy.  Initiate more transfers and standing as pt will allow.   Rosalio Loud, OTR/L 05/22/2023, 12:29 PM

## 2023-05-22 NOTE — Therapy (Signed)
OUTPATIENT PHYSICAL THERAPY NEURO TREATMENT   Patient Name: [name] MRN: [id number] DOB:19-Jul-1956, 67 y.o., male Today's Date: 05/22/2023   PCP: Lucretia Field  REFERRING PROVIDER: Rolly Salter, MD  END OF SESSION:  PT End of Session - 05/22/23 1452     Visit Number 5    Number of Visits 7    Date for PT Re-Evaluation 06/06/23    Authorization Type Medicare/Medicaid    PT Start Time 1103    PT Stop Time 1143    PT Time Calculation (min) 40 min    Equipment Utilized During Treatment Gait belt    Activity Tolerance Patient tolerated treatment well    Behavior During Therapy Impulsive               Past Medical History:  Diagnosis Date   Barrett esophagus    DM II (diabetes mellitus, type II), controlled (HCC) 11/13/2020   Dysphagia    Gastroparesis    GERD (gastroesophageal reflux disease)    Hypercholesteremia    Hypertension    Iron deficiency anemia    Mental retardation    Schizophrenia (HCC)    Seizure (HCC)    Stroke [facility])    Past Surgical History:  Procedure Laterality Date   BIOPSY  10/21/2020   Procedure: BIOPSY;  Surgeon: Iva Boop, MD;  Location: [facility] ENDOSCOPY;  Service: Endoscopy;;   CRANIOTOMY FOR CYST FENESTRATION     ESOPHAGOGASTRODUODENOSCOPY N/A 01/09/2015   Procedure: ESOPHAGOGASTRODUODENOSCOPY (EGD);  Surgeon: Louis Meckel, MD;  Location: [facility] ENDOSCOPY;  Service: Endoscopy;  Laterality: N/A;   ESOPHAGOGASTRODUODENOSCOPY N/A 10/26/2020   Procedure: ESOPHAGOGASTRODUODENOSCOPY (EGD);  Surgeon: Corliss Skains, MD;  Location: [facility] OR;  Service: Thoracic;  Laterality: N/A;   ESOPHAGOGASTRODUODENOSCOPY (EGD) WITH PROPOFOL N/A 10/21/2020   Procedure: ESOPHAGOGASTRODUODENOSCOPY (EGD) WITH PROPOFOL;  Surgeon: Iva Boop, MD;  Location: [facility] ENDOSCOPY;  Service: Endoscopy;  Laterality: N/A;   IR GASTR TUBE CONVERT GASTR-JEJ PER W/FL MOD SED  11/07/2020   PEG PLACEMENT N/A 10/26/2020   Procedure: PERCUTANEOUS ENDOSCOPIC  GASTROSTOMY (PEG) PLACEMENT;  Surgeon: Corliss Skains, MD;  Location: [facility] OR;  Service: Thoracic;  Laterality: N/A;   XI ROBOTIC ASSISTED HIATAL HERNIA REPAIR N/A 10/26/2020   Procedure: XI ROBOTIC ASSISTED HIATAL HERNIA REPAIR;  Surgeon: Corliss Skains, MD;  Location: [facility] OR;  Service: Thoracic;  Laterality: N/A;   Patient Active Problem List   Diagnosis Date Noted   Breakthrough seizure (HCC) 04/11/2023   General weakness 04/09/2023   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) 10/26/2021   Seizure disorder (HCC) 10/26/2021   S/P craniotomy 03/09/2021   Dilation of esophagus    Hyponatremia    Dysphagia    Seizures (HCC)    S/P percutaneous endoscopic gastrostomy (PEG) tube placement (HCC)    Weakness 11/15/2020   DM II (diabetes mellitus, type II), controlled (HCC) 11/13/2020   Bowel obstruction (HCC)    S/P repair of paraesophageal hernia 10/26/2020   Syncope 10/21/2020   Hiatal hernia 10/21/2020   Acute hypoxemic respiratory failure (HCC) 10/21/2020   Sinus tachycardia 10/21/2020   Gastric polyps    Altered mental status 09/05/2020   Emesis 07/24/2015   Coffee ground emesis    Gastrointestinal hemorrhage associated with other gastritis    Acute esophagitis 01/09/2015   Upper gastrointestinal bleed    Diarrhea    Nausea with vomiting    GI bleed 01/08/2015   GIB (gastrointestinal bleeding) 01/08/2015  Nausea & vomiting 01/08/2015   Reflux esophagitis 07/04/2009   Loss of weight 07/04/2009   BLOOD IN STOOL, OCCULT 07/04/2009   ANEMIA, IRON DEFICIENCY 05/04/2008   Hypothyroidism 04/05/2008   Schizophrenia, unspecified type (HCC) 04/05/2008   Depression 04/05/2008   Hemiplegia (HCC) 04/05/2008   Essential hypertension 04/05/2008   GERD 04/05/2008   Gastroparesis 04/05/2008   Seizure (HCC) 04/05/2008   History of diverticulosis 08/21/2004   Barrett's esophagus 04/01/2002   HIATAL HERNIA 04/01/2002    ONSET  DATE: 04/09/23  REFERRING DIAG: R53.1 (ICD-10-CM) - Left-sided weakness G40.919 (ICD-10-CM) - Breakthrough seizure (HCC)  THERAPY DIAG:  Hemiplegia and hemiparesis following cerebral infarction affecting left non-dominant side (HCC)  Unsteadiness on feet  Other abnormalities of gait and mobility  Rationale for Evaluation and Treatment: Rehabilitation  SUBJECTIVE:                                                                                                                                                                                             SUBJECTIVE STATEMENT: Caregiver states pt is doing much better -  is doing well today:  is wearing AFO today - pt had AFO previously from 2 years ago and they found it at his group home - has been wearing it at home during ambulation  Pt accompanied by: self, caregiver -[name]  PERTINENT HISTORY: DMII, GERD, HLD, HTN, anemia, schizophrenia, seizure, stroke with L hemiparesis, benign intracranial tumor s/p craniotomy 2004, dysphagia, cognitive deficit   PAIN:  Are you having pain? No  PRECAUTIONS: Fall and Other: seizures  WEIGHT BEARING RESTRICTIONS: No  FALLS: Has patient fallen in last 6 months? No  LIVING ENVIRONMENT: Per OT's note as caregiver not present for PT session:  Lives with: lives in an adult home Lives in: House/apartment Stairs: No Has following equipment at home: Dan Humphreys - 4 wheeled, Wheelchair (power), shower chair, and Grab bars   PLOF:  Mod I for toileting, but Min-mod assist for ADLs.  Utilized Rollator for mobility prior to most recent hospitalization  PATIENT GOALS: pt agrees that he wants to work on moving better   OBJECTIVE:    TODAY'S TREATMENT: 05/22/23    Activity Comments     Sit-stand transfers at mat 1x10 Offset stance to increase weight bearing on LLE - mod cues to lean anteriorly as pt leans posteriorly   Gait training Pt wearing AFO ([place] Walk on) on LLE ;  80' x 1 rep;   40' x 2  reps  Pt used Lt platform RW - needed min assist for walker placement at times and constant cues to decrease speed due to impulsivity;  also cues to lift LLE in swing   Transfer training Cues to reach back prior to sitting and for correct hand placement with sit to stand         TherEx:   LAQ LLE with 2# weight with 3 sec hold with mod cues to sit erect and to reduce posterior lean Lt hip flexion 2# weight with cues to prevent posterior lean  Neuro Re-ed:  Pt performed toe taps onto 4" step inside // bars - attempted RLE to facilitate LLE weight shift but pt had difficulty understanding directions for this exercise and stood up onto 4" step - performed 5 reps: then performed step ups with LLE with min to mod assist 10 reps with RUE support on // bars  Pt performed 2 reps forward/backward ambulation inside // bars with min assist and 2 reps sideways amb. Inside // bars with mod assist for balance and correct LLE positioning  HOME EXERCISE PROGRAM: Access Code: [email or web address] URL: [email or web address] Date: 04/25/2023 Prepared by: [facility] Outpatient  Rehab - [facility]  Exercises - Seated March  - 1 x daily - 5 x weekly - 2 sets - 10 reps - Seated Long Arc Quad  - 1 x daily - 5 x weekly - 2 sets - 10 reps - Seated Hip Adduction Isometrics with Ball  - 1 x daily - 5 x weekly - 2 sets - 10 reps - 3 sec hold - Sit to Stand with Counter Support  - 1 x daily - 5 x weekly - 2 sets - 10 reps  PATIENT EDUCATION: Caregiver/patient educated in use of platform and requisite safety to prepare for home use  Below measures were taken at time of initial evaluation unless otherwise specified:   DIAGNOSTIC FINDINGS: 04/10/23 brain MRI: No acute intracranial process. Unchanged resection cavity with surrounding encephalomalacia and gliosis in the right frontal lobe.  COGNITION: Overall cognitive status: History of cognitive impairments - at baseline   SENSATION: Intact in B Les  to light touch  COORDINATION:  Alternating toe tap: pt lifting R and L LE with hip flexors rather than DF; limited motion on L  MUSCLE TONE: appears intact in L LE but limited by cognition/understanding on R LE d/t tendency to try to perform AROM  POSTURE: posterior pelvic tilt and rounded spine in w/c, head bend downward and to the R  LOWER EXTREMITY MMT:     MMT Right Eval Left Eval  Hip flexion 4 3  Hip extension    Hip abduction 4 2  Hip adduction 4 2  Hip internal rotation    Hip external rotation    Knee flexion 4+ 1  Knee extension 4+ 4-  Ankle dorsiflexion 4 0 (unable to elicit contraction but unsure if limited by cognition)  Ankle plantarflexion 4 0  (unable to elicit contraction but unsure if limited by cognition)  Ankle inversion    Ankle eversion     (Blank rows = not tested)  LOWER EXTREMITY MMT:    AROM Right Eval Left Eval  Hip flexion    Hip extension    Hip abduction    Hip adduction    Hip internal rotation    Hip external rotation    Knee flexion    Knee extension    Ankle dorsiflexion 6 -15  Ankle plantarflexion    Ankle inversion    Ankle eversion    (Blank rows = not tested)  TRANSFERS: Assistive device utilized: Environmental consultant - 2 wheeled  Sit to stand: Min A and x2 Stand to sit: Min A and x2 Pt able to perform marching with R LE but unable to move L LE; required assist in managing L LE on RW   GAIT: Gait pattern:  pt unable today Trialed self-w/c propulsion with R UE but with limited ability and path deviation        GOALS: Goals reviewed with patient? Yes  SHORT TERM GOALS: Target date: 05/16/2023  Patient to perform initial HEP with caregiver assist. Baseline: HEP initiated Goal status: IN PROGRESS    LONG TERM GOALS: Target date: 06/06/2023  Patient to perform advanced HEP with caregiver assist. Baseline: Not yet initiated  Goal status: IN PROGRESS  Patient to demonstrate L LE strength >/=3/5.  Baseline: See  above Goal status: IN PROGRESS  Patient to demonstrate improved head, pelvic, and spinal position in w/c,  Baseline: rounded posture, head drooping down Goal status: IN PROGRESS  Patient to perform sit to stand transfer with supervision and LRAD.   Baseline: min A x 2 with RW Goal status: IN PROGRESS  Patient to ambulate 30 feet with LRAD and CGA.  Baseline: unable Goal status: IN PROGRESS   ASSESSMENT:  CLINICAL IMPRESSION: Pt significantly improving with mobility including transfers and ambulation.  Pt wearing his AFO to today's session (caregiver reports they found it at home); pt needs frequent cueing to decrease speed due to impulsivity.  Pt able to perform sit to stand transfers with CGA to min assist but demonstrates posterior lean upon initial standing without RW in front of him.  Cont with POC.    OBJECTIVE IMPAIRMENTS: Abnormal gait, decreased activity tolerance, decreased balance, decreased mobility, difficulty walking, decreased ROM, decreased strength, impaired flexibility, improper body mechanics, and postural dysfunction.   ACTIVITY LIMITATIONS: carrying, lifting, bending, sitting, standing, squatting, stairs, transfers, bed mobility, bathing, toileting, dressing, reach over head, hygiene/grooming, and locomotion level  PARTICIPATION LIMITATIONS: community activity and church  PERSONAL FACTORS: Age, Behavior pattern, Past/current experiences, Time since onset of injury/illness/exacerbation, Transportation, and 3+ comorbidities: DMII, GERD, HLD, HTN, anemia, schizophrenia, seizure, stroke with L hemiparesis, benign intracranial tumor s/p craniotomy 2004, dysphagia  are also affecting patient's functional outcome.   REHAB POTENTIAL: Good  CLINICAL DECISION MAKING: Evolving/moderate complexity  EVALUATION COMPLEXITY: Moderate  PLAN:  PT FREQUENCY: 1x/week  PT DURATION: 6 weeks  PLANNED INTERVENTIONS: Therapeutic exercises, Therapeutic activity, Neuromuscular  re-education, Balance training, Gait training, Patient/Family education, Self Care, Joint mobilization, Stair training, Vestibular training, Canalith repositioning, Orthotic/Fit training, DME instructions, Aquatic Therapy, Dry Needling, Electrical stimulation, Cryotherapy, Moist heat, Taping, Manual therapy, and Re-evaluation  PLAN FOR NEXT SESSION: Continue STS transfers at RW or parallel bars, gait and balance training, wt shifting onto LLE    2:54 PM, 05/22/23 Kerry Fort, PT Physical Therapist- [place] Office Number: [phone]

## 2023-05-25 ENCOUNTER — Encounter (HOSPITAL_COMMUNITY): Payer: Self-pay | Admitting: Emergency Medicine

## 2023-05-25 ENCOUNTER — Emergency Department (HOSPITAL_COMMUNITY): Payer: Medicare Other

## 2023-05-25 ENCOUNTER — Other Ambulatory Visit: Payer: Self-pay

## 2023-05-25 ENCOUNTER — Observation Stay (HOSPITAL_COMMUNITY): Payer: Medicare Other

## 2023-05-25 ENCOUNTER — Observation Stay (HOSPITAL_COMMUNITY)
Admission: EM | Admit: 2023-05-25 | Discharge: 2023-05-26 | Disposition: A | Discharge status: Home / Self Care | Payer: Medicare Other | Attending: Family Medicine | Admitting: Family Medicine

## 2023-05-25 DIAGNOSIS — Z79899 Other long term (current) drug therapy: Secondary | ICD-10-CM | POA: Diagnosis not present

## 2023-05-25 DIAGNOSIS — G8114 Spastic hemiplegia affecting left nondominant side: Secondary | ICD-10-CM | POA: Insufficient documentation

## 2023-05-25 DIAGNOSIS — I69954 Hemiplegia and hemiparesis following unspecified cerebrovascular disease affecting left non-dominant side: Secondary | ICD-10-CM

## 2023-05-25 DIAGNOSIS — I10 Essential (primary) hypertension: Secondary | ICD-10-CM | POA: Diagnosis present

## 2023-05-25 DIAGNOSIS — R4182 Altered mental status, unspecified: Secondary | ICD-10-CM | POA: Diagnosis present

## 2023-05-25 DIAGNOSIS — E119 Type 2 diabetes mellitus without complications: Secondary | ICD-10-CM | POA: Insufficient documentation

## 2023-05-25 DIAGNOSIS — E782 Mixed hyperlipidemia: Secondary | ICD-10-CM | POA: Insufficient documentation

## 2023-05-25 DIAGNOSIS — R569 Unspecified convulsions: Secondary | ICD-10-CM

## 2023-05-25 DIAGNOSIS — R6250 Unspecified lack of expected normal physiological development in childhood: Secondary | ICD-10-CM | POA: Insufficient documentation

## 2023-05-25 DIAGNOSIS — Z86011 Personal history of benign neoplasm of the brain: Secondary | ICD-10-CM | POA: Diagnosis not present

## 2023-05-25 DIAGNOSIS — E039 Hypothyroidism, unspecified: Secondary | ICD-10-CM | POA: Diagnosis not present

## 2023-05-25 DIAGNOSIS — G9341 Metabolic encephalopathy: Secondary | ICD-10-CM | POA: Diagnosis not present

## 2023-05-25 DIAGNOSIS — G40909 Epilepsy, unspecified, not intractable, without status epilepticus: Secondary | ICD-10-CM | POA: Diagnosis not present

## 2023-05-25 DIAGNOSIS — F209 Schizophrenia, unspecified: Secondary | ICD-10-CM | POA: Diagnosis present

## 2023-05-25 DIAGNOSIS — R410 Disorientation, unspecified: Secondary | ICD-10-CM

## 2023-05-25 DIAGNOSIS — G9340 Encephalopathy, unspecified: Secondary | ICD-10-CM | POA: Diagnosis present

## 2023-05-25 LAB — CBC WITH DIFFERENTIAL/PLATELET
Abs Immature Granulocytes: 0.02 10*3/uL (ref 0.00–0.07)
Basophils Absolute: 0 10*3/uL (ref 0.0–0.1)
Basophils Relative: 1 %
Eosinophils Absolute: 0.1 10*3/uL (ref 0.0–0.5)
Eosinophils Relative: 3 %
HCT: 45.2 % (ref 39.0–52.0)
Hemoglobin: 15 g/dL (ref 13.0–17.0)
Immature Granulocytes: 1 %
Lymphocytes Relative: 57 %
Lymphs Abs: 2 10*3/uL (ref 0.7–4.0)
MCH: 32.1 pg (ref 26.0–34.0)
MCHC: 33.2 g/dL (ref 30.0–36.0)
MCV: 96.6 fL (ref 80.0–100.0)
Monocytes Absolute: 0.3 10*3/uL (ref 0.1–1.0)
Monocytes Relative: 8 %
Neutro Abs: 1.1 10*3/uL — ABNORMAL LOW (ref 1.7–7.7)
Neutrophils Relative %: 30 %
Platelets: 178 10*3/uL (ref 150–400)
RBC: 4.68 MIL/uL (ref 4.22–5.81)
RDW: 13 % (ref 11.5–15.5)
WBC: 3.5 10*3/uL — ABNORMAL LOW (ref 4.0–10.5)
nRBC: 0 % (ref 0.0–0.2)

## 2023-05-25 LAB — COMPREHENSIVE METABOLIC PANEL
ALT: 16 U/L (ref 0–44)
AST: 20 U/L (ref 15–41)
Albumin: 3.6 g/dL (ref 3.5–5.0)
Alkaline Phosphatase: 63 U/L (ref 38–126)
Anion gap: 6 (ref 5–15)
BUN: 11 mg/dL (ref 8–23)
CO2: 31 mmol/L (ref 22–32)
Calcium: 9.1 mg/dL (ref 8.9–10.3)
Chloride: 103 mmol/L (ref 98–111)
Creatinine, Ser: 1.24 mg/dL (ref 0.61–1.24)
GFR, Estimated: >60 mL/min (ref >60)
Glucose, Bld: 81 mg/dL (ref 70–99)
Potassium: 4 mmol/L (ref 3.5–5.1)
Sodium: 140 mmol/L (ref 135–145)
Total Bilirubin: 0.4 mg/dL (ref 0.3–1.2)
Total Protein: 6.8 g/dL (ref 6.5–8.1)

## 2023-05-25 LAB — URINALYSIS, W/ REFLEX TO CULTURE (INFECTION SUSPECTED)
Bacteria, UA: NONE SEEN
Bilirubin Urine: NEGATIVE
Glucose, UA: NEGATIVE mg/dL
Hgb urine dipstick: NEGATIVE
Ketones, ur: NEGATIVE mg/dL
Leukocytes,Ua: NEGATIVE
Nitrite: NEGATIVE
Protein, ur: NEGATIVE mg/dL
Specific Gravity, Urine: 1.003 — ABNORMAL LOW (ref 1.005–1.030)
pH: 7 (ref 5.0–8.0)

## 2023-05-25 LAB — ETHANOL: Alcohol, Ethyl (B): <10 mg/dL (ref <10)

## 2023-05-25 LAB — VALPROIC ACID LEVEL: Valproic Acid Lvl: 112 ug/mL — ABNORMAL HIGH (ref 50.0–100.0)

## 2023-05-25 LAB — TROPONIN I (HIGH SENSITIVITY): Troponin I (High Sensitivity): 4 ng/L (ref <18)

## 2023-05-25 MED ORDER — ROSUVASTATIN CALCIUM 20 MG PO TABS: 20.0000 mg | ORAL_TABLET | Freq: Every day | ORAL | Status: DC

## 2023-05-25 MED ORDER — ORAL CARE MOUTH RINSE: 15.0000 mL | OROMUCOSAL | Status: DC | PRN

## 2023-05-25 MED ORDER — SODIUM CHLORIDE 0.9 % IV SOLN
75.0000 mL/h | INTRAVENOUS | Status: DC
  Administered 2023-05-25: 75 mL/h via INTRAVENOUS

## 2023-05-25 MED ORDER — ENSURE ENLIVE PO LIQD
237.0000 mL | Freq: Three times a day (TID) | ORAL | Status: DC
  Administered 2023-05-25 – 2023-05-26 (×2): 237 mL via ORAL
  Filled 2023-05-25: qty 237

## 2023-05-25 MED ORDER — LAMOTRIGINE 100 MG PO TABS
100.0000 mg | ORAL_TABLET | Freq: Every day | ORAL | Status: DC
  Administered 2023-05-25: 100 mg via ORAL
  Filled 2023-05-25: qty 1

## 2023-05-25 MED ORDER — POLYETHYLENE GLYCOL 3350 17 G PO PACK
17.0000 g | PACK | Freq: Two times a day (BID) | ORAL | Status: DC
  Administered 2023-05-25 – 2023-05-26 (×2): 17 g via ORAL
  Filled 2023-05-25 (×2): qty 1

## 2023-05-25 MED ORDER — ORAL CARE MOUTH RINSE
15.0000 mL | OROMUCOSAL | Status: DC
  Administered 2023-05-25 – 2023-05-26 (×4): 15 mL via OROMUCOSAL

## 2023-05-25 MED ORDER — DIVALPROEX SODIUM 125 MG PO CSDR
750.0000 mg | DELAYED_RELEASE_CAPSULE | Freq: Two times a day (BID) | ORAL | Status: DC
  Administered 2023-05-25 – 2023-05-26 (×2): 750 mg via ORAL
  Filled 2023-05-25 (×2): qty 6

## 2023-05-25 MED ORDER — METOPROLOL TARTRATE 25 MG PO TABS
25.0000 mg | ORAL_TABLET | Freq: Two times a day (BID) | ORAL | Status: DC
  Administered 2023-05-25: 25 mg via ORAL
  Filled 2023-05-25: qty 1

## 2023-05-25 MED ORDER — LEVOTHYROXINE SODIUM 75 MCG PO TABS
175.0000 ug | ORAL_TABLET | Freq: Every day | ORAL | Status: DC
  Administered 2023-05-26: 175 ug via ORAL
  Filled 2023-05-25: qty 1

## 2023-05-25 MED ORDER — ENOXAPARIN SODIUM 40 MG/0.4ML IJ SOSY
40.0000 mg | PREFILLED_SYRINGE | INTRAMUSCULAR | Status: DC
  Administered 2023-05-25: 40 mg via SUBCUTANEOUS
  Filled 2023-05-25: qty 0.4

## 2023-05-25 MED ORDER — PANTOPRAZOLE SODIUM 40 MG PO TBEC
40.0000 mg | DELAYED_RELEASE_TABLET | Freq: Two times a day (BID) | ORAL | Status: DC
  Administered 2023-05-26 (×2): 40 mg via ORAL
  Filled 2023-05-25 (×2): qty 1

## 2023-05-25 MED ORDER — QUETIAPINE FUMARATE 200 MG PO TABS
400.0000 mg | ORAL_TABLET | Freq: Two times a day (BID) | ORAL | Status: DC
  Administered 2023-05-25 – 2023-05-26 (×2): 400 mg via ORAL
  Filled 2023-05-25 (×2): qty 2

## 2023-05-25 MED ORDER — DOCUSATE SODIUM 100 MG PO CAPS
100.0000 mg | ORAL_CAPSULE | Freq: Two times a day (BID) | ORAL | Status: DC
  Administered 2023-05-25 – 2023-05-26 (×2): 100 mg via ORAL
  Filled 2023-05-25 (×2): qty 1

## 2023-05-25 NOTE — ED Provider Notes (Signed)
67 yo male w/ hx of seizures, stroke, left sided weakness, noted to have slurred speech today morning at his group home. No noted specific seizure activity. Pt awake, somewhat somnolent  MRI here with chronic encephalomalacia, no acute CVA Valproic acid level minor elevated 112.  UA unremarkable. Trop 4.  Physical Exam  BP (!) 155/92   Pulse 68   Temp (!) 96.6 F (35.9 C) (Axillary)   Resp (!) 21   Ht 5\' 3"  (1.6 m)   Wt 73 kg   SpO2 99%   BMI 28.51 kg/m   Physical Exam  Procedures  Procedures  ED Course / MDM   Clinical Course as of 05/25/23 1848  Sat May 25, 2023  1747 Patient is reassessed in the room with his sister at bedside who is his power of attorney.  The patient has had improvement of his mental status, and speech, but he still not back to baseline.  He was unable to recognize his sister which is extremely unusual she states.  Given this fact, I discussed the case with the on-call neurologist. [MT]  [phone] Neurology consulted, Dr Otelia Limes, planning for EEG, will admit patient to hospital for AMS.   [MT]  1847 Admitted to hospitalist Dr Chipper Herb [MT]    Clinical Course User Index [MT] Renaye Rakers Kermit Balo, MD   Medical Decision Making Amount and/or Complexity of Data Reviewed Labs: ordered. Radiology: ordered.  Risk Decision regarding hospitalization.         Terald Sleeper, MD 05/25/23 [phone]

## 2023-05-25 NOTE — ED Notes (Signed)
EEG technician at bedside

## 2023-05-25 NOTE — Consult Note (Signed)
Neurology Consultation  Reason for Consult: Altered mental status Referring Physician: Dr. Chipper Herb  CC: Altered mental status  History is obtained from: Chart  HPI: [name] is a 67 y.o. 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  No witnessed seizures but patient is off of his baseline with moderate dysarthria and difficulty with communication. Concern for underlying seizure activity for which she was admitted for overnight observation. Neurology was consulted for medical management of antiepileptics with concern that there might be underlying seizures Per chart review, initial report was more lethargic than usual.  ED provider spoke with the family member was concerned that he was unable to recognize family members which is not his baseline.  They are also worried about worsening of the left facial drooping He was improving some in the ER but still not back to baseline.  On-call neurology was consulted who recommended observation overnight.   ROS: Full ROS was performed and is negative except as noted in the HPI  Past Medical History:  Diagnosis Date   Barrett esophagus    DM II (diabetes mellitus, type II), controlled (HCC) 11/13/2020   Dysphagia    Gastroparesis    GERD (gastroesophageal reflux disease)    Hypercholesteremia    Hypertension    Iron deficiency anemia    Mental retardation    Schizophrenia (HCC)    Seizure (HCC)    Stroke (HCC)      Family History  Problem Relation Age of Onset   Breast cancer Mother    Heart disease Father      Social History:   reports that he has never smoked. He has never used smokeless tobacco. He reports that he does not drink alcohol and does not use drugs.  Medications  Current  Facility-Administered Medications:    0.9 %  sodium chloride infusion, 75 mL/hr, Intravenous, Continuous, Zhang, Ilda Foil T, MD   divalproex (DEPAKOTE SPRINKLE) capsule 750 mg, 750 mg, Oral, BID, Zhang, Renae Fickle, MD   docusate sodium (COLACE) capsule 100 mg, 100 mg, Oral, BID, Zhang, Ping T, MD   enoxaparin (LOVENOX) injection 40 mg, 40 mg, Subcutaneous, Q24H, Zhang, Ping T, MD   feeding supplement (ENSURE ENLIVE / ENSURE PLUS) liquid 237 mL, 237 mL, Oral, TID BM, Mikey College T, MD   lamoTRIgine (LAMICTAL) tablet 100 mg, 100 mg, Oral, QHS, Emeline General, MD   [START ON 05/26/2023] levothyroxine (SYNTHROID) tablet 175 mcg, 175 mcg, Oral, Daily, Mikey College T, MD   metoprolol tartrate (LOPRESSOR) tablet 25 mg, 25 mg, Oral, BID, Mikey College T, MD   Oral care mouth rinse, 15 mL, Mouth Rinse, Q2H, Zhang, Renae Fickle, MD   Oral care mouth rinse, 15 mL, Mouth Rinse, PRN, Emeline General, MD   [START ON 05/26/2023] pantoprazole (PROTONIX) EC tablet 40 mg, 40 mg, Oral, BID AC, Zhang, Ilda Foil T, MD   polyethylene glycol (MIRALAX / GLYCOLAX) packet 17 g, 17 g, Oral, BID, Zhang, Ping T, MD   QUEtiapine (SEROQUEL) tablet 400 mg, 400 mg, Oral, BID, Mikey College T, MD   [START ON 05/26/2023] rosuvastatin (CRESTOR) tablet 20 mg, 20 mg, Oral, Daily, Arabella Merles, [facility]  Current Outpatient Medications:    Acetylcysteine (NAC) 500 MG CAPS, Take 1 capsule by mouth 2 (two) times daily., Disp: , Rfl:    benazepril (LOTENSIN) 5 MG tablet,  Take 5 mg by mouth every evening., Disp: , Rfl:    Cholecalciferol (VITAMIN D) 50 MCG (2000 UT) tablet, Take 1 tablet (2,000 Units total) by mouth daily., Disp: 30 tablet, Rfl: 0   divalproex (DEPAKOTE) 250 MG DR tablet, Take 250 mg by mouth 2 (two) times daily., Disp: , Rfl:    divalproex (DEPAKOTE) 500 MG DR tablet, Take 500 mg by mouth 2 (two) times daily., Disp: , Rfl:    docusate sodium (COLACE) 100 MG capsule, Take 1 capsule (100 mg total) by mouth 2 (two) times daily., Disp: 10 capsule, Rfl: 0    Emollient (CORN HUSKERS EX), Apply 1 application topically in the morning and at bedtime. For dry skin, Disp: , Rfl:    lamoTRIgine (LAMICTAL) 100 MG tablet, Take 1 tablet (100 mg total) by mouth at bedtime., Disp: 90 tablet, Rfl: 3   levothyroxine (SYNTHROID) 175 MCG tablet, Take 175 mcg by mouth daily., Disp: , Rfl:    metoprolol tartrate (LOPRESSOR) 25 MG tablet, Take 1 tablet (25 mg total) by mouth 2 (two) times daily., Disp: 180 tablet, Rfl: 3   pantoprazole (PROTONIX) 40 MG tablet, Take 1 tablet (40 mg total) by mouth 2 (two) times daily before a meal., Disp: 60 tablet, Rfl: 0   polyethylene glycol (MIRALAX / GLYCOLAX) 17 g packet, Take 17 g by mouth 2 (two) times daily., Disp: 14 each, Rfl: 0   QUEtiapine (SEROQUEL) 400 MG tablet, Take 1 tablet (400 mg total) by mouth 2 (two) times daily., Disp: 60 tablet, Rfl: 0   rosuvastatin (CRESTOR) 20 MG tablet, Take 1 tablet (20 mg total) by mouth daily., Disp: 30 tablet, Rfl: 0   ondansetron (ZOFRAN) 4 MG tablet, Take 4 mg by mouth 4 (four) times daily as needed for nausea or vomiting., Disp: , Rfl:    Exam: Current vital signs: BP (!) 140/77   Pulse 67   Temp 98.3 F (36.8 C) (Oral)   Resp (!) 22   Ht 5\' 3"  (1.6 m)   Wt 73 kg   SpO2 98%   BMI 28.51 kg/m  Vital signs in last 24 hours: Temp:  [96.6 F (35.9 C)-98.3 F (36.8 C)] 98.3 F (36.8 C) (06/01 1909) Pulse Rate:  [56-77] 67 (06/01 2045) Resp:  [13-23] 22 (06/01 2045) BP: (115-155)/(67-92) 140/77 (06/01 2000) SpO2:  [96 %-100 %] 98 % (06/01 2045) Weight:  [73 kg] 73 kg (06/01 1204) General: Awake alert in no distress HEENT: Normocephalic atraumatic Lungs: Clear Cardiovascular: Regular rhythm Abdomen nondistended nontender Neurological exam He is awake alert oriented to self Was unable to tell me the date month or his age He has very dysarthric He was able to name objects and repeat as well as follow simple commands but when he started speaking longer sentences some of  them are garbled. Cranial nerves: Pupils equal react light, extraocular movements unhindered, visual fields full, mild left lower facial asymmetry at rest, tongue and palate midline. Motor examination with antigravity strength in bilateral upper extremities but left lower extremity is not against gravity.  Residual hemiparesis on the left is baseline Sensation intact to light touch Coordination difficult to assess.  Gait testing deferred   Labs I have reviewed labs in epic and the results pertinent to this consultation are: Chest x-ray and UA unremarkable CBC    Component Value Date/Time   WBC 3.5 (L) 05/25/2023 1427   RBC 4.68 05/25/2023 1427   HGB 15.0 05/25/2023 1427   HCT 45.2 05/25/2023 1427  PLT 178 05/25/2023 1427   MCV 96.6 05/25/2023 1427   MCH 32.1 05/25/2023 1427   MCHC 33.2 05/25/2023 1427   RDW 13.0 05/25/2023 1427   LYMPHSABS 2.0 05/25/2023 1427   MONOABS 0.3 05/25/2023 1427   EOSABS 0.1 05/25/2023 1427   BASOSABS 0.0 05/25/2023 1427    CMP     Component Value Date/Time   NA 140 05/25/2023 1427   K 4.0 05/25/2023 1427   CL 103 05/25/2023 1427   CO2 31 05/25/2023 1427   GLUCOSE 81 05/25/2023 1427   BUN 11 05/25/2023 1427   CREATININE 1.24 05/25/2023 1427   CALCIUM 9.1 05/25/2023 1427   PROT 6.8 05/25/2023 1427   ALBUMIN 3.6 05/25/2023 1427   AST 20 05/25/2023 1427   ALT 16 05/25/2023 1427   ALKPHOS 63 05/25/2023 1427   BILITOT 0.4 05/25/2023 1427   GFRNONAA >60 05/25/2023 1427   GFRAA >60 09/07/2020 1212   Depakote level: 112.  Lamotrigine level pending  Imaging I have reviewed the images obtained:  CT-head: Redemonstrated large right frontal area of encephalomalacia with no acute findings.  Assessment:  67 year old with large right sided encephalomalacia from a prior stroke status post removal of brain tumor many years ago, poststroke epilepsy amongst other comorbidities presented for altered mental status.  Concern for ongoing depressed and  altered mentation leading to neurological consultation. On my examination, he does have moderate dysarthria and his speech on longer sentences becomes garbled. I am not sure what his exact baseline is but at this point, it is reasonable to observe him overnight, get a routine EEG and check some labs as below. There is an no evidence of clinical seizures or chest x-ray or urinalysis abnormalities  Impression: Altered mental status-evaluate for breakthrough seizure  Recommendations: Continue Depakote at home dose I would also check ammonia level given mildly elevated Depakote level but will not change dose of Depakote unless his ammonia is elevated Routine EEG Seizure precautions Neurology will follow  -- Milon Dikes, MD Neurologist [facility] Pager: [phone]

## 2023-05-25 NOTE — H&P (Signed)
History and Physical    [name] ZOX:[id number] DOB: 06/11/1956 DOA: 05/25/2023  PCP: Lucretia Field (Confirm with patient/family/NH records and if not entered, this has to be entered at [facility] point of entry) Patient coming from: Group home  I have personally briefly reviewed patient's old medical records in [facility] Link  Chief Complaint: I feel ok  HPI: [name] is a 67 y.o. male with medical history significant of benign intracranial tumor status post craniotomy 2004, complex partial seizure, schizophrenia intellectual disability/delay, chronic dysphagia, Barrett's esophagus, GERD, hypothyroidism, HTN, HLD, history of CVA with residual left-sided weakness, presented with acute mentation changes.  Patient does not remember what has happened and reported instead " everything is fine".  History obtained by reviewing ER record.  Caregiver reported to ED staff earlier that the patient became sleepy and lethargic which is abnormal compared to his baseline and sent patient to ED.  Patient denies any active pain, no dysuria denies any weakness numbness.  ED Course: CT head showed chronic encephalomalacia of the right frontal lobe otherwise no acute changes.  Vital signs stable afebrile nonhypoxic nontachycardic.  Chest x-ray no acute infiltrates.  UA showed no acute UTI WBC 0.5, creatinine 1.2, bicarb 31.    Valproic acid 112.  Patient was eval by neurology and recommend EEG to rule out subclinical seizure.  Review of Systems: As per HPI otherwise 14 point review of systems negative.    Past Medical History:  Diagnosis Date   Barrett esophagus    DM II (diabetes mellitus, type II), controlled (HCC) 11/13/2020   Dysphagia    Gastroparesis    GERD (gastroesophageal reflux disease)    Hypercholesteremia    Hypertension    Iron deficiency anemia    Mental retardation    Schizophrenia (HCC)    Seizure (HCC)    Stroke [facility])     Past Surgical History:  Procedure  Laterality Date   BIOPSY  10/21/2020   Procedure: BIOPSY;  Surgeon: Iva Boop, MD;  Location: [facility] ENDOSCOPY;  Service: Endoscopy;;   CRANIOTOMY FOR CYST FENESTRATION     ESOPHAGOGASTRODUODENOSCOPY N/A 01/09/2015   Procedure: ESOPHAGOGASTRODUODENOSCOPY (EGD);  Surgeon: Louis Meckel, MD;  Location: [facility] ENDOSCOPY;  Service: Endoscopy;  Laterality: N/A;   ESOPHAGOGASTRODUODENOSCOPY N/A 10/26/2020   Procedure: ESOPHAGOGASTRODUODENOSCOPY (EGD);  Surgeon: Corliss Skains, MD;  Location: [facility] OR;  Service: Thoracic;  Laterality: N/A;   ESOPHAGOGASTRODUODENOSCOPY (EGD) WITH PROPOFOL N/A 10/21/2020   Procedure: ESOPHAGOGASTRODUODENOSCOPY (EGD) WITH PROPOFOL;  Surgeon: Iva Boop, MD;  Location: [facility] ENDOSCOPY;  Service: Endoscopy;  Laterality: N/A;   IR GASTR TUBE CONVERT GASTR-JEJ PER W/FL MOD SED  11/07/2020   PEG PLACEMENT N/A 10/26/2020   Procedure: PERCUTANEOUS ENDOSCOPIC GASTROSTOMY (PEG) PLACEMENT;  Surgeon: Corliss Skains, MD;  Location: [facility] OR;  Service: Thoracic;  Laterality: N/A;   XI ROBOTIC ASSISTED HIATAL HERNIA REPAIR N/A 10/26/2020   Procedure: XI ROBOTIC ASSISTED HIATAL HERNIA REPAIR;  Surgeon: Corliss Skains, MD;  Location: [facility] OR;  Service: Thoracic;  Laterality: N/A;     reports that he has never smoked. He has never used smokeless tobacco. He reports that he does not drink alcohol and does not use drugs.  Allergies  Allergen Reactions   Vancomycin Other (See Comments) and Itching    Unknown-possible hives  Unknown-possible hives, Hives.    Family History  Problem Relation Age of Onset   Breast cancer Mother    Heart disease Father     Prior  to Admission medications   Medication Sig Start Date End Date Taking? Authorizing Provider  Cholecalciferol (VITAMIN D) 50 MCG (2000 UT) tablet Take 1 tablet (2,000 Units total) by mouth daily. 12/06/20   Angiulli, Mcarthur Rossetti, PA-C  divalproex (DEPAKOTE SPRINKLE) 125 MG capsule Take 6 capsules (750 mg total) by mouth  2 (two) times daily. 04/23/23   Levert Feinstein, MD  docusate sodium (COLACE) 100 MG capsule Take 1 capsule (100 mg total) by mouth 2 (two) times daily. 12/06/20   Angiulli, Mcarthur Rossetti, PA-C  Emollient (CORN HUSKERS EX) Apply 1 application topically in the morning and at bedtime. For dry skin    [provider]  feeding supplement (ENSURE ENLIVE / ENSURE PLUS) LIQD Take 237 mLs by mouth 3 (three) times daily between meals. 04/12/23   Rolly Salter, MD  lamoTRIgine (LAMICTAL) 100 MG tablet Take 1 tablet (100 mg total) by mouth at bedtime. 04/24/23   Levert Feinstein, MD  levothyroxine (SYNTHROID) 175 MCG tablet Take 175 mcg by mouth daily. 04/05/23   [provider]  lidocaine (LIDODERM) 5 % Place 1 patch onto the skin daily. Remove & Discard patch within 12 hours or as directed by MD 04/12/23   Rolly Salter, MD  metoprolol tartrate (LOPRESSOR) 25 MG tablet Take 1 tablet (25 mg total) by mouth 2 (two) times daily. 07/10/22   Raulkar, Drema Pry, MD  ondansetron (ZOFRAN) 4 MG tablet Take 4 mg by mouth 4 (four) times daily as needed for nausea or vomiting.    [provider]  pantoprazole (PROTONIX) 40 MG tablet Take 1 tablet (40 mg total) by mouth 2 (two) times daily before a meal. 12/06/20   Angiulli, Mcarthur Rossetti, PA-C  polyethylene glycol (MIRALAX / GLYCOLAX) 17 g packet Take 17 g by mouth 2 (two) times daily. 12/06/20   Angiulli, Mcarthur Rossetti, PA-C  QUEtiapine (SEROQUEL) 400 MG tablet Take 1 tablet (400 mg total) by mouth 2 (two) times daily. 12/06/20   Angiulli, Mcarthur Rossetti, PA-C  rosuvastatin (CRESTOR) 20 MG tablet Take 1 tablet (20 mg total) by mouth daily. 12/07/20   Charlton Amor, PA-C    Physical Exam: Vitals:   05/25/23 1800 05/25/23 1900 05/25/23 1909 05/25/23 1910  BP: (!) 155/92  (!) 143/92   Pulse: 68 77 76   Resp: (!) 21  (!) 23 (!) 23  Temp:   98.3 F (36.8 C)   TempSrc:   Oral   SpO2: 99% 96% 100%   Weight:      Height:        Constitutional: NAD, calm,  comfortable Vitals:   05/25/23 1800 05/25/23 1900 05/25/23 1909 05/25/23 1910  BP: (!) 155/92  (!) 143/92   Pulse: 68 77 76   Resp: (!) 21  (!) 23 (!) 23  Temp:   98.3 F (36.8 C)   TempSrc:   Oral   SpO2: 99% 96% 100%   Weight:      Height:       Eyes: PERRL, lids and conjunctivae normal ENMT: Mucous membranes are moist. Posterior pharynx clear of any exudate or lesions.Normal dentition.  Neck: normal, supple, no masses, no thyromegaly Respiratory: clear to auscultation bilaterally, no wheezing, no crackles. Normal respiratory effort. No accessory muscle use.  Cardiovascular: Regular rate and rhythm, no murmurs / rubs / gallops. No extremity edema. 2+ pedal pulses. No carotid bruits.  Abdomen: no tenderness, no masses palpated. No hepatosplenomegaly. Bowel sounds positive.  Musculoskeletal: no clubbing / cyanosis. No joint  deformity upper and lower extremities. Good ROM, no contractures. Normal muscle tone.  Skin: no rashes, lesions, ulcers. No induration Neurologic: CN 2-12 grossly intact. Sensation intact, DTR normal. Strength 5/5 in all 4.  Psychiatric: Normal judgment and insight. Alert and oriented x 3. Normal mood.     Labs on Admission: I have personally reviewed following labs and imaging studies  CBC: Recent Labs  Lab 05/25/23 1427  WBC 3.5*  NEUTROABS 1.1*  HGB 15.0  HCT 45.2  MCV 96.6  PLT 178   Basic Metabolic Panel: Recent Labs  Lab 05/25/23 1427  NA 140  K 4.0  CL 103  CO2 31  GLUCOSE 81  BUN 11  CREATININE 1.24  CALCIUM 9.1   GFR: Estimated Creatinine Clearance: 53.2 mL/min (by C-G formula based on SCr of 1.24 mg/dL). Liver Function Tests: Recent Labs  Lab 05/25/23 1427  AST 20  ALT 16  ALKPHOS 63  BILITOT 0.4  PROT 6.8  ALBUMIN 3.6   No results for input(s): "LIPASE", "AMYLASE" in the last 168 hours. No results for input(s): "AMMONIA" in the last 168 hours. Coagulation Profile: No results for input(s): "INR", "PROTIME" in the last  168 hours. Cardiac Enzymes: No results for input(s): "CKTOTAL", "CKMB", "CKMBINDEX", "TROPONINI" in the last 168 hours. BNP (last 3 results) No results for input(s): "PROBNP" in the last 8760 hours. HbA1C: No results for input(s): "HGBA1C" in the last 72 hours. CBG: No results for input(s): "GLUCAP" in the last 168 hours. Lipid Profile: No results for input(s): "CHOL", "HDL", "LDLCALC", "TRIG", "CHOLHDL", "LDLDIRECT" in the last 72 hours. Thyroid Function Tests: No results for input(s): "TSH", "T4TOTAL", "FREET4", "T3FREE", "THYROIDAB" in the last 72 hours. Anemia Panel: No results for input(s): "VITAMINB12", "FOLATE", "FERRITIN", "TIBC", "IRON", "RETICCTPCT" in the last 72 hours. Urine analysis:    Component Value Date/Time   COLORURINE STRAW (A) 05/25/2023 1251   APPEARANCEUR CLEAR 05/25/2023 1251   LABSPEC 1.003 (L) 05/25/2023 1251   PHURINE 7.0 05/25/2023 1251   GLUCOSEU NEGATIVE 05/25/2023 1251   HGBUR NEGATIVE 05/25/2023 1251   BILIRUBINUR NEGATIVE 05/25/2023 1251   KETONESUR NEGATIVE 05/25/2023 1251   PROTEINUR NEGATIVE 05/25/2023 1251   UROBILINOGEN 1.0 07/23/2015 2300   NITRITE NEGATIVE 05/25/2023 1251   LEUKOCYTESUR NEGATIVE 05/25/2023 1251    Radiological Exams on Admission: CT Head Wo Contrast  Result Date: 05/25/2023 CLINICAL DATA:  Altered mental status EXAM: CT HEAD WITHOUT CONTRAST TECHNIQUE: Contiguous axial images were obtained from the base of the skull through the vertex without intravenous contrast. RADIATION DOSE REDUCTION: This exam was performed according to the departmental dose-optimization program which includes automated exposure control, adjustment of the mA and/or kV according to patient size and/or use of iterative reconstruction technique. COMPARISON:  04/09/2023 FINDINGS: Brain: No acute intracranial findings are seen. There is large area of encephalomalacia in the right frontal lobe. There is right frontal craniotomy. There is ex vacuo dilation of  right lateral ventricle. There are no signs of bleeding within the cranium. There is no focal mass effect. There are small linear foci of fat attenuation adjacent to the falx suggesting possible small lipomas. Vascular: Unremarkable. Skull: There is right frontal craniotomy. Sinuses/Orbits: There is mucosal thickening in left maxillary sinus. Other: No significant interval changes are noted. IMPRESSION: There is large area of encephalomalacia in the right frontal lobe with no significant change. No acute intracranial findings are seen. Electronically Signed   By: Ernie Avena M.D.   On: 05/25/2023 13:47   DG Chest [facility]  1 View  Result Date: 05/25/2023 CLINICAL DATA:  Acute mental status change EXAM: PORTABLE CHEST 1 VIEW COMPARISON:  March 15, 2023 FINDINGS: The heart size and mediastinal contours are within normal limits. Both lungs are clear. The visualized skeletal structures are unremarkable. IMPRESSION: No active disease. Electronically Signed   By: Gerome Sam III M.D.   On: 05/25/2023 13:16    EKG: Independently reviewed.  Sinus rhythm, no acute ST changes.  Assessment/Plan Principal Problem:   Seizure [facility]) Active Problems:   Seizure disorder (HCC)   Acute encephalopathy  (please populate well all problems here in Problem List. (For example, if patient is on BP meds at home and you resume or decide to hold them, it is a problem that needs to be her. Same for CAD, COPD, HLD and so on)  Acute encephalopathy -Rule out breakthrough seizure -EEG pending.  CT head reassuring. -Will continue home antiseizure regimen of Depakote and lamotrigine.  Waiting for neurology consultation. -Seizure precaution -Other DDx, no active infection source identified, UA showed no UTI chest x-ray no acute infiltrates and patient denies any diarrhea.  No significant rash or pressure ulcer on physical exam.  History of partial complex seizure disorder -As above  Hypothyroidism -Continue  Synthroid  HTN -Continue metoprolol  Intellectual disability -Continue Seroquel  DVT prophylaxis: Lovenox Code Status: Full code Family Communication: None at bedside Disposition Plan: Expect less than 2 midnight hospital stay Consults called: Neurology Admission status: Telemetry observation   Emeline General MD [facility] Pager [phone]  05/25/2023, 8:14 PM

## 2023-05-25 NOTE — Progress Notes (Signed)
EEG complete - results pending 

## 2023-05-25 NOTE — ED Triage Notes (Signed)
Pt to ER via EMS from home where he lives with caregivers.  EMS reports that caregiver states 2 hours ago pt became "more lethargic than normal".  Pt denies c/o, states he is tired and wants to sleep. Pt was able to move himself from stretcher to stretcher without assistance.

## 2023-05-25 NOTE — ED Provider Notes (Signed)
[facility] EMERGENCY DEPARTMENT AT [facility] Provider Note   CSN: [id number] Arrival date & time: 05/25/23  1157     History  Chief Complaint  Patient presents with   Weakness    [name] is a 67 y.o. male.  Patient is a 67 year old male with a history of schizophrenia, MR, seizure disorder, prior stroke and craniotomy, diabetes, hypertension who lives at a group home with chronic left-sided weakness presenting today due to an episode of altered mental status.  Per house manager patient has been doing very well recently.  Patient has been eating and drinking normally.  Compliant with medications, interactive with staff with no recent cough, congestion, not sleeping, urinary complaints.  Today he was normal when he got up however around 1030 today he started having slurred speech, more drooping on the left side and just not acting himself.  They also said he may have had some mild twitching of his arm but they are not sure.  His caregiver now who is in the room says he is seeming to improve from what he had been but is still not quite at his baseline.  The history is provided by the patient, medical records and a caregiver.  Weakness      Home Medications Prior to Admission medications   Medication Sig Start Date End Date Taking? Authorizing Provider  Cholecalciferol (VITAMIN D) 50 MCG (2000 UT) tablet Take 1 tablet (2,000 Units total) by mouth daily. 12/06/20   Angiulli, Mcarthur Rossetti, PA-C  divalproex (DEPAKOTE SPRINKLE) 125 MG capsule Take 6 capsules (750 mg total) by mouth 2 (two) times daily. 04/23/23   Levert Feinstein, MD  docusate sodium (COLACE) 100 MG capsule Take 1 capsule (100 mg total) by mouth 2 (two) times daily. 12/06/20   Angiulli, Mcarthur Rossetti, PA-C  Emollient (CORN HUSKERS EX) Apply 1 application topically in the morning and at bedtime. For dry skin    [provider]  feeding supplement (ENSURE ENLIVE / ENSURE PLUS) LIQD Take 237 mLs by mouth 3  (three) times daily between meals. 04/12/23   Rolly Salter, MD  lamoTRIgine (LAMICTAL) 100 MG tablet Take 1 tablet (100 mg total) by mouth at bedtime. 04/24/23   Levert Feinstein, MD  levothyroxine (SYNTHROID) 175 MCG tablet Take 175 mcg by mouth daily. 04/05/23   [provider]  lidocaine (LIDODERM) 5 % Place 1 patch onto the skin daily. Remove & Discard patch within 12 hours or as directed by MD 04/12/23   Rolly Salter, MD  metoprolol tartrate (LOPRESSOR) 25 MG tablet Take 1 tablet (25 mg total) by mouth 2 (two) times daily. 07/10/22   Raulkar, Drema Pry, MD  ondansetron (ZOFRAN) 4 MG tablet Take 4 mg by mouth 4 (four) times daily as needed for nausea or vomiting.    [provider]  pantoprazole (PROTONIX) 40 MG tablet Take 1 tablet (40 mg total) by mouth 2 (two) times daily before a meal. 12/06/20   Angiulli, Mcarthur Rossetti, PA-C  polyethylene glycol (MIRALAX / GLYCOLAX) 17 g packet Take 17 g by mouth 2 (two) times daily. 12/06/20   Angiulli, Mcarthur Rossetti, PA-C  QUEtiapine (SEROQUEL) 400 MG tablet Take 1 tablet (400 mg total) by mouth 2 (two) times daily. 12/06/20   Angiulli, Mcarthur Rossetti, PA-C  rosuvastatin (CRESTOR) 20 MG tablet Take 1 tablet (20 mg total) by mouth daily. 12/07/20   Angiulli, Mcarthur Rossetti, PA-C      Allergies    Vancomycin  Review of Systems   Review of Systems  Neurological:  Positive for weakness.    Physical Exam Updated Vital Signs BP (!) 142/85   Pulse (!) 56   Temp (!) 96.6 F (35.9 C) (Axillary)   Resp (!) 22   Ht 5\' 3"  (1.6 m)   Wt 73 kg   SpO2 99%   BMI 28.51 kg/m  Physical Exam Vitals and nursing note reviewed.  Constitutional:      General: He is not in acute distress.    Appearance: Normal appearance. He is well-developed.  HENT:     Head: Normocephalic and atraumatic.  Eyes:     Conjunctiva/sclera: Conjunctivae normal.     Pupils: Pupils are equal, round, and reactive to light.  Cardiovascular:     Rate and Rhythm: Normal rate and regular  rhythm.     Heart sounds: No murmur heard. Pulmonary:     Effort: Pulmonary effort is normal. No respiratory distress.     Breath sounds: Normal breath sounds. No wheezing or rales.  Abdominal:     General: There is no distension.     Palpations: Abdomen is soft.     Tenderness: There is no abdominal tenderness. There is no guarding or rebound.  Musculoskeletal:        General: No tenderness. Normal range of motion.     Cervical back: Normal range of motion and neck supple.  Skin:    General: Skin is warm and dry.     Findings: No erythema or rash.  Neurological:     Mental Status: He is alert and oriented to person, place, and time. Mental status is at baseline.     Comments: Left upper and lower ext weakness.  Symmetric smile and tongue was midline.  Psychiatric:        Behavior: Behavior normal.     Comments: Cooperative and responds to questioning promptly     ED Results / Procedures / Treatments   Labs (all labs ordered are listed, but only abnormal results are displayed) Labs Reviewed  CBC WITH DIFFERENTIAL/PLATELET - Abnormal; Notable for the following components:      Result Value   WBC 3.5 (*)    Neutro Abs 1.1 (*)    All other components within normal limits  URINALYSIS, W/ REFLEX TO CULTURE (INFECTION SUSPECTED) - Abnormal; Notable for the following components:   Color, Urine STRAW (*)    Specific Gravity, Urine 1.003 (*)    All other components within normal limits  VALPROIC ACID LEVEL - Abnormal; Notable for the following components:   Valproic Acid Lvl 112 (*)    All other components within normal limits  COMPREHENSIVE METABOLIC PANEL  ETHANOL  TROPONIN I (HIGH SENSITIVITY)  TROPONIN I (HIGH SENSITIVITY)    EKG EKG Interpretation  Date/Time:  Saturday May 25 2023 12:06:34 EDT Ventricular Rate:  69 PR Interval:  115 QRS Duration: 104 QT Interval:  375 QTC Calculation: 402 R Axis:   47 Text Interpretation: Sinus rhythm Borderline short PR interval  new  Nonspecific T abnormalities, lateral leads Confirmed by Gwyneth Sprout ([id number]) on 05/25/2023 1:06:06 PM  Radiology CT Head Wo Contrast  Result Date: 05/25/2023 CLINICAL DATA:  Altered mental status EXAM: CT HEAD WITHOUT CONTRAST TECHNIQUE: Contiguous axial images were obtained from the base of the skull through the vertex without intravenous contrast. RADIATION DOSE REDUCTION: This exam was performed according to the departmental dose-optimization program which includes automated exposure control, adjustment of the mA and/or kV according to  patient size and/or use of iterative reconstruction technique. COMPARISON:  04/09/2023 FINDINGS: Brain: No acute intracranial findings are seen. There is large area of encephalomalacia in the right frontal lobe. There is right frontal craniotomy. There is ex vacuo dilation of right lateral ventricle. There are no signs of bleeding within the cranium. There is no focal mass effect. There are small linear foci of fat attenuation adjacent to the falx suggesting possible small lipomas. Vascular: Unremarkable. Skull: There is right frontal craniotomy. Sinuses/Orbits: There is mucosal thickening in left maxillary sinus. Other: No significant interval changes are noted. IMPRESSION: There is large area of encephalomalacia in the right frontal lobe with no significant change. No acute intracranial findings are seen. Electronically Signed   By: Ernie Avena M.D.   On: 05/25/2023 13:47   DG Chest Port 1 View  Result Date: 05/25/2023 CLINICAL DATA:  Acute mental status change EXAM: PORTABLE CHEST 1 VIEW COMPARISON:  March 15, 2023 FINDINGS: The heart size and mediastinal contours are within normal limits. Both lungs are clear. The visualized skeletal structures are unremarkable. IMPRESSION: No active disease. Electronically Signed   By: Gerome Sam III M.D.   On: 05/25/2023 13:16    Procedures Procedures    Medications Ordered in ED Medications - No data to  display  ED Course/ Medical Decision Making/ A&P                             Medical Decision Making Amount and/or Complexity of Data Reviewed Labs: ordered. Radiology: ordered.   Pt with multiple medical problems and comorbidities and presenting today with a complaint that caries a high risk for morbidity and mortality.  Here today due to change in mental status that occurred about 1040 today.  Patient has chronic left-sided weakness, history of seizures and stroke.  No traumas.  Low suspicion for intracranial hemorrhage or acute infectious pathology.  Patient's symptoms are starting to improve and suspect most likely patient had breakthrough seizure despite being on antiepileptics.  Lower suspicion at this time for stroke or TIA.  Labs are pending.  I independently interpreted patient's EKG that showed some nonspecific T waves which are different from prior and a troponin was added.  However patient is awake and alert in the room.  He does not appear to be in any distress.  Does have chronic left-sided findings and do not feel that he has a code stroke at this time. I independently interpreted patient's labs.  CBC with minimal leukopenia of 3.5 but otherwise normal, CMP without acute findings, Depakote level is 112 only minimally above normal. I have independently visualized and interpreted pt's images today.  Large area of encephalomalacia but no evidence of bleeding today.  Chest x-ray within normal limits.  Patient checked out to Dr. Renaye Rakers but will touch base with neurology to ensure no further testing needs to be done.  If patient is not back to baseline may need an EEG.         Final Clinical Impression(s) / ED Diagnoses Final diagnoses:  None    Rx / DC Orders ED Discharge Orders     None         Gwyneth Sprout, MD 05/25/23 1653

## 2023-05-25 NOTE — ED Notes (Addendum)
ED TO INPATIENT HANDOFF REPORT  ED Nurse Name and Phone #: Louie Casa Name/Age/Gender [name] 67 y.o. male Room/Bed: 034C/034C  Code Status   Code Status: Full Code  Home/SNF/Other Home - Group Home Patient oriented to: self, place, time, and situation Is this baseline? Yes   Triage Complete: Triage complete  Chief Complaint Seizure [facility]) [R56.9]  Triage Note Pt to ER via EMS from home where he lives with caregivers.  EMS reports that caregiver states 2 hours ago pt became "more lethargic than normal".  Pt denies c/o, states he is tired and wants to sleep. Pt was able to move himself from stretcher to stretcher without assistance.   Allergies Allergies  Allergen Reactions   Vancomycin Other (See Comments) and Itching    Unknown-possible hives  Unknown-possible hives, Hives.    Level of Care/Admitting Diagnosis ED Disposition     ED Disposition  Admit   Condition  --   Comment  Hospital Area: [facility] [[id number]]  Level of Care: Telemetry Medical [104]  May place patient in observation at [facility] or [facility] if equivalent level of care is available:: No  Covid Evaluation: Asymptomatic - no recent exposure (last 10 days) testing not required  Diagnosis: Seizure [facility]) [[id number]]  Admitting Physician: Emeline General [[id number]]  Attending Physician: Emeline General [[id number]]          B Medical/Surgery History Past Medical History:  Diagnosis Date   Barrett esophagus    DM II (diabetes mellitus, type II), controlled (HCC) 11/13/2020   Dysphagia    Gastroparesis    GERD (gastroesophageal reflux disease)    Hypercholesteremia    Hypertension    Iron deficiency anemia    Mental retardation    Schizophrenia (HCC)    Seizure (HCC)    Stroke [facility])    Past Surgical History:  Procedure Laterality Date   BIOPSY  10/21/2020   Procedure: BIOPSY;  Surgeon: Iva Boop, MD;  Location[facility] ENDOSCOPY;  Service: Endoscopy;;    CRANIOTOMY FOR CYST FENESTRATION     ESOPHAGOGASTRODUODENOSCOPY N/A 01/09/2015   Procedure: ESOPHAGOGASTRODUODENOSCOPY (EGD);  Surgeon: Louis Meckel, MD;  Location: [facility] ENDOSCOPY;  Service: Endoscopy;  Laterality: N/A;   ESOPHAGOGASTRODUODENOSCOPY N/A 10/26/2020   Procedure: ESOPHAGOGASTRODUODENOSCOPY (EGD);  Surgeon: Corliss Skains, MD;  Location[facility] OR;  Service: Thoracic;  Laterality: N/A;   ESOPHAGOGASTRODUODENOSCOPY (EGD) WITH PROPOFOL N/A 10/21/2020   Procedure: ESOPHAGOGASTRODUODENOSCOPY (EGD) WITH PROPOFOL;  Surgeon: Iva Boop, MD;  Location: [facility] ENDOSCOPY;  Service: Endoscopy;  Laterality: N/A;   IR GASTR TUBE CONVERT GASTR-JEJ PER W/FL MOD SED  11/07/2020   PEG PLACEMENT N/A 10/26/2020   Procedure: PERCUTANEOUS ENDOSCOPIC GASTROSTOMY (PEG) PLACEMENT;  Surgeon: Corliss Skains, MD;  Location: [facility] OR;  Service: Thoracic;  Laterality: N/A;   XI ROBOTIC ASSISTED HIATAL HERNIA REPAIR N/A 10/26/2020   Procedure: XI ROBOTIC ASSISTED HIATAL HERNIA REPAIR;  Surgeon: Corliss Skains, MD;  Location: [facility] OR;  Service: Thoracic;  Laterality: N/A;     A IV Location/Drains/Wounds Patient Lines/Drains/Airways Status     Active Line/Drains/Airways     Name Placement date Placement time Site Days   Peripheral IV 05/25/23 22 G 1.75" Anterior;Right;Upper Arm 05/25/23  1453  Arm  less than 1   Gastrostomy/Enterostomy PEG-jejunostomy;Other (Comment) 24 Fr. Other (comment) 11/07/20  1559  Other (comment)  929   External Urinary Catheter 04/09/23  1549  --  46  Intake/Output Last 24 hours  Intake/Output Summary (Last 24 hours) at 05/25/2023 1938 Last data filed at 05/25/2023 1911 Gross per 24 hour  Intake --  Output 120 ml  Net -120 ml    Labs/Imaging Results for orders placed or performed during the hospital encounter of 05/25/23 (from the past 48 hour(s))  Urinalysis, w/ Reflex to Culture (Infection Suspected) -Urine, Clean Catch     Status: Abnormal    Collection Time: 05/25/23 12:51 PM  Result Value Ref Range   Specimen Source URINE, CLEAN CATCH    Color, Urine STRAW (A) YELLOW   APPearance CLEAR CLEAR   Specific Gravity, Urine 1.003 (L) 1.005 - 1.030   pH 7.0 5.0 - 8.0   Glucose, UA NEGATIVE NEGATIVE mg/dL   Hgb urine dipstick NEGATIVE NEGATIVE   Bilirubin Urine NEGATIVE NEGATIVE   Ketones, ur NEGATIVE NEGATIVE mg/dL   Protein, ur NEGATIVE NEGATIVE mg/dL   Nitrite NEGATIVE NEGATIVE   Leukocytes,Ua NEGATIVE NEGATIVE   RBC / HPF 0-5 0 - 5 RBC/hpf   WBC, UA 0-5 0 - 5 WBC/hpf    Comment:        Reflex urine culture not performed if WBC <=10, OR if Squamous epithelial cells >5. If Squamous epithelial cells >5 suggest recollection.    Bacteria, UA NONE SEEN NONE SEEN   Squamous Epithelial / HPF 0-5 0 - 5 /HPF    Comment: Performed at [facility], [address], [place], [place] [address]  CBC with Differential/Platelet     Status: Abnormal   Collection Time: 05/25/23  2:27 PM  Result Value Ref Range   WBC 3.5 (L) 4.0 - 10.5 K/uL   RBC 4.68 4.22 - 5.81 MIL/uL   Hemoglobin 15.0 13.0 - 17.0 g/dL   HCT 60.4 54.0 - 98.1 %   MCV 96.6 80.0 - 100.0 fL   MCH 32.1 26.0 - 34.0 pg   MCHC 33.2 30.0 - 36.0 g/dL   RDW 19.1 47.8 - 29.5 %   Platelets 178 150 - 400 K/uL   nRBC 0.0 0.0 - 0.2 %   Neutrophils Relative % 30 %   Neutro Abs 1.1 (L) 1.7 - 7.7 K/uL   Lymphocytes Relative 57 %   Lymphs Abs 2.0 0.7 - 4.0 K/uL   Monocytes Relative 8 %   Monocytes Absolute 0.3 0.1 - 1.0 K/uL   Eosinophils Relative 3 %   Eosinophils Absolute 0.1 0.0 - 0.5 K/uL   Basophils Relative 1 %   Basophils Absolute 0.0 0.0 - 0.1 K/uL   Immature Granulocytes 1 %   Abs Immature Granulocytes 0.02 0.00 - 0.07 K/uL    Comment: Performed at [facility], [address] [place], [place] [address]  Comprehensive metabolic panel     Status: None   Collection Time: 05/25/23  2:27 PM  Result Value Ref Range   Sodium 140 135 - 145 mmol/L   Potassium  4.0 3.5 - 5.1 mmol/L   Chloride 103 98 - 111 mmol/L   CO2 31 22 - 32 mmol/L   Glucose, Bld 81 70 - 99 mg/dL    Comment: Glucose reference range applies only to samples taken after fasting for at least 8 hours.   BUN 11 8 - 23 mg/dL   Creatinine, Ser 8.65 0.61 - 1.24 mg/dL   Calcium 9.1 8.9 - 78.4 mg/dL   Total Protein 6.8 6.5 - 8.1 g/dL   Albumin 3.6 3.5 - 5.0 g/dL   AST 20 15 -  41 U/L   ALT 16 0 - 44 U/L   Alkaline Phosphatase 63 38 - 126 U/L   Total Bilirubin 0.4 0.3 - 1.2 mg/dL   GFR, Estimated >16 >10 mL/min    Comment: (NOTE) Calculated using the CKD-EPI Creatinine Equation (2021)    Anion gap 6 5 - 15    Comment: Performed at [facility], [address] [place], [place] [address]  Ethanol     Status: None   Collection Time: 05/25/23  2:27 PM  Result Value Ref Range   Alcohol, Ethyl (B) <10 <10 mg/dL    Comment: (NOTE) Lowest detectable limit for serum alcohol is 10 mg/dL.  For medical purposes only. Performed at [facility], [address], [place], [place] [address]   Valproic acid level     Status: Abnormal   Collection Time: 05/25/23  2:27 PM  Result Value Ref Range   Valproic Acid Lvl 112 (H) 50.0 - 100.0 ug/mL    Comment: Performed at [facility], [address] [place], [place] [address]  Troponin I (High Sensitivity)     Status: None   Collection Time: 05/25/23  2:27 PM  Result Value Ref Range   Troponin I (High Sensitivity) 4 <18 ng/L    Comment: (NOTE) Elevated high sensitivity troponin I (hsTnI) values and significant  changes across serial measurements may suggest ACS but many other  chronic and acute conditions are known to elevate hsTnI results.  Refer to the "Links" section for chest pain algorithms and additional  guidance. Performed at [facility], [address] [place], [place] [address]    CT Head Wo Contrast  Result Date: 05/25/2023 CLINICAL DATA:  Altered mental status EXAM: CT HEAD WITHOUT CONTRAST  TECHNIQUE: Contiguous axial images were obtained from the base of the skull through the vertex without intravenous contrast. RADIATION DOSE REDUCTION: This exam was performed according to the departmental dose-optimization program which includes automated exposure control, adjustment of the mA and/or kV according to patient size and/or use of iterative reconstruction technique. COMPARISON:  04/09/2023 FINDINGS: Brain: No acute intracranial findings are seen. There is large area of encephalomalacia in the right frontal lobe. There is right frontal craniotomy. There is ex vacuo dilation of right lateral ventricle. There are no signs of bleeding within the cranium. There is no focal mass effect. There are small linear foci of fat attenuation adjacent to the falx suggesting possible small lipomas. Vascular: Unremarkable. Skull: There is right frontal craniotomy. Sinuses/Orbits: There is mucosal thickening in left maxillary sinus. Other: No significant interval changes are noted. IMPRESSION: There is large area of encephalomalacia in the right frontal lobe with no significant change. No acute intracranial findings are seen. Electronically Signed   By: Ernie Avena M.D.   On: 05/25/2023 13:47   DG Chest Port 1 View  Result Date: 05/25/2023 CLINICAL DATA:  Acute mental status change EXAM: PORTABLE CHEST 1 VIEW COMPARISON:  March 15, 2023 FINDINGS: The heart size and mediastinal contours are within normal limits. Both lungs are clear. The visualized skeletal structures are unremarkable. IMPRESSION: No active disease. Electronically Signed   By: Gerome Sam III M.D.   On: 05/25/2023 13:16    Pending Labs Unresulted Labs (From admission, onward)     Start     Ordered   05/25/23 1806  Lamotrigine level  Once,   URGENT       Question:  Specimen collection method  Answer:  IV Team=IV  Team collect   05/25/23 1805            Vitals/Pain Today's Vitals   05/25/23 1900 05/25/23 1909 05/25/23 1910  05/25/23 1911  BP:  (!) 143/92    Pulse: 77 76    Resp:  (!) 23 (!) 23   Temp:  98.3 F (36.8 C)    TempSrc:  Oral    SpO2: 96% 100%    Weight:      Height:      PainSc:    0-No pain    Isolation Precautions No active isolations  Medications Medications  metoprolol tartrate (LOPRESSOR) tablet 25 mg (has no administration in time range)  rosuvastatin (CRESTOR) tablet 20 mg (has no administration in time range)  QUEtiapine (SEROQUEL) tablet 400 mg (has no administration in time range)  levothyroxine (SYNTHROID) tablet 175 mcg (has no administration in time range)  docusate sodium (COLACE) capsule 100 mg (has no administration in time range)  pantoprazole (PROTONIX) EC tablet 40 mg (has no administration in time range)  polyethylene glycol (MIRALAX / GLYCOLAX) packet 17 g (has no administration in time range)  divalproex (DEPAKOTE SPRINKLE) capsule 750 mg (has no administration in time range)  lamoTRIgine (LAMICTAL) tablet 100 mg (has no administration in time range)  feeding supplement (ENSURE ENLIVE / ENSURE PLUS) liquid 237 mL (has no administration in time range)  Oral care mouth rinse (has no administration in time range)  Oral care mouth rinse (has no administration in time range)  0.9 %  sodium chloride infusion (has no administration in time range)  enoxaparin (LOVENOX) injection 40 mg (has no administration in time range)    Mobility walks with device     Focused Assessments -   R Recommendations: See Admitting Provider Note  Report given to:   Additional Notes: - seizure precautions in place

## 2023-05-26 DIAGNOSIS — R4182 Altered mental status, unspecified: Secondary | ICD-10-CM | POA: Diagnosis not present

## 2023-05-26 DIAGNOSIS — G9341 Metabolic encephalopathy: Secondary | ICD-10-CM | POA: Diagnosis not present

## 2023-05-26 DIAGNOSIS — R6250 Unspecified lack of expected normal physiological development in childhood: Secondary | ICD-10-CM | POA: Insufficient documentation

## 2023-05-26 DIAGNOSIS — E782 Mixed hyperlipidemia: Secondary | ICD-10-CM | POA: Insufficient documentation

## 2023-05-26 LAB — AMMONIA: Ammonia: 28 umol/L (ref 9–35)

## 2023-05-26 MED ORDER — ORAL CARE MOUTH RINSE: 15.0000 mL | Freq: Two times a day (BID) | OROMUCOSAL | Status: DC

## 2023-05-26 NOTE — Procedures (Addendum)
Patient Name: [name]  MRN: [id number]  Epilepsy Attending: Charlsie Quest  Referring Physician/Provider: Caryl Pina, MD  Date: 05/26/2023 Duration: 24.50 mins  Patient history: 67 year old with large right sided encephalomalacia from a prior stroke status post removal of brain tumor many years ago, poststroke epilepsy amongst other comorbidities presented for altered mental status. EEG to evaluate for seizure  Level of alertness: Awake  AEDs during EEG study: LTG, VPA  Technical aspects: This EEG study was done with scalp electrodes positioned according to the 10-20 International system of electrode placement. Electrical activity was reviewed with band pass filter of 1-70Hz , sensitivity of 7 uV/mm, display speed of 56mm/sec with a 60Hz  notched filter applied as appropriate. EEG data were recorded continuously and digitally stored.  Video monitoring was available and reviewed as appropriate.  Description: The posterior dominant rhythm consists of 7.5 Hz activity of moderate voltage (25-35 uV) seen predominantly in posterior head regions, symmetric and reactive to eye opening and eye closing. EEG showed continuous 3 to 6 Hz theta-delta slowing in right frontal region. Photic driving was not seen during photic stimulation. Hyperventilation was not performed.      ABNORMALITY - Continuous slow, right frontal region   IMPRESSION: This study is suggestive of cortical dysfunction arising from right frontal region likely secondary to underlying encephalomalacia. No seizures or epileptiform discharges were seen throughout the recording.   Grisela Mesch Annabelle Harman

## 2023-05-26 NOTE — Significant Event (Signed)
Staff assisted patient inside the group home's transportation [place]. RN went over the AVS with [facility] staff member Kay-all questions answered. RN gave her a packet with the discharge summary and the AVS. Patient has all his belongings with him (clothes; a pair of socks on and a pair of shoes on patient with the left ortho support on; patient's personal transferring gait). RN called patient's sister [name] and left a voice message that he has been discharge back to [facility] group home.

## 2023-05-26 NOTE — TOC Transition Note (Addendum)
Transition of Care [facility]) - CM/SW Discharge Note   Patient Details  Name: [name] MRN: [id number] Date of Birth: [birth year]  Transition of Care [facility]) CM/SW Contact:  Deatra Baldass[place], [place] Phone Number: 05/26/2023, 2:42 PM   Clinical Narrative:  Pt for dc back to his [facility] group home today. Pt's sister [name] aware of dc and reports agreeable. Spoke to [employer] [phone] who confirmed they will provide transport. DC summary in packet, RN to add AVS. SW singing off at dc.   Dellie [place], MSW, LCSW [phone] (coverage)       Final next level of care: Group Home Barriers to Discharge: No Barriers Identified   Patient Goals and CMS Choice      Discharge Placement                  Patient to be transferred to facility by: group home to transport Name of family member notified: [name]/sister/HCPOA Patient and family notified of of transfer: 05/26/23  Discharge Plan and Services Additional resources added to the After Visit Summary for                                       Social Determinants of Health (SDOH) Interventions SDOH Screenings   Food Insecurity: No Food Insecurity (04/09/2023)  Housing: Low Risk  (04/09/2023)  Transportation Needs: No Transportation Needs (04/09/2023)  Utilities: Not At Risk (04/09/2023)  Depression (PHQ2-9): Low Risk  (07/10/2022)  Tobacco Use: Low Risk  (05/25/2023)     Readmission Risk Interventions     No data to display

## 2023-05-26 NOTE — Discharge Summary (Signed)
Physician Discharge Summary   Patient: [name] MRN: [id number] DOB: 23-Apr-1956  Admit date:     05/25/2023  Discharge date: 05/26/23  Discharge Physician: Alberteen Sam   PCP: Lucretia Field     Recommendations at discharge:  Follow up with Dr. Terrace Arabia Neurology in 2-4 weeks for seizure Follow up with PCP Dr. 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  In the ER, appeared to be improving but not at baseline.  CTH no change from baseline.  Neurology consulted who recommended observation overnight and EEG.   Overnight, the patient was stable, without fever, focal infectious symptoms or pain.  In the morning, he was asking to go home.  He had had no seizure overnight, and EEG was unremarkable.  Ammonia level normal.  Recommend continued Depakote and lamotrigine and follow up with Dr. Terrace Arabia  Given BP on soft side without symptoms, benazepril was held at discharge.           The [facility] Controlled Substances Registry was reviewed for this patient prior to discharge.  Consultants: Neurology  Procedures performed:  CT head EEG   Disposition: Group home Diet recommendation:  Regular diet  DISCHARGE MEDICATION: Allergies as of 05/26/2023       Reactions   Vancomycin Other (See Comments), Itching    Unknown-possible hives Unknown-possible hives, Hives.        Medication List     STOP taking these medications    benazepril 5 MG tablet Commonly known as: LOTENSIN       TAKE these medications    CORN HUSKERS EX Apply 1 application topically in the morning and at bedtime. For dry skin   divalproex 250 MG DR tablet Commonly known as: DEPAKOTE Take 250 mg by mouth 2 (two) times daily.   divalproex 500 MG DR tablet Commonly known as: DEPAKOTE Take 500 mg by mouth 2 (two) times daily.   docusate sodium 100 MG capsule Commonly known as: COLACE Take 1 capsule (100 mg total) by mouth 2 (two) times daily.   lamoTRIgine 100 MG tablet Commonly known as: LAMICTAL Take 1 tablet (100 mg total) by mouth at bedtime.   levothyroxine 175 MCG tablet Commonly known as: SYNTHROID Take 175 mcg by mouth daily.   metoprolol tartrate 25 MG tablet Commonly known as: LOPRESSOR Take 1 tablet (25 mg total) by mouth 2 (two) times daily.   NAC 500 MG Caps Generic drug: Acetylcysteine Take 1 capsule by mouth 2 (two) times daily.   ondansetron 4 MG tablet Commonly known as: ZOFRAN Take 4 mg by mouth 4 (four) times daily as needed for nausea or vomiting.   pantoprazole 40 MG tablet Commonly known as: PROTONIX Take 1 tablet (40 mg total) by mouth 2 (two) times daily before a meal.   polyethylene glycol 17 g packet Commonly known as: MIRALAX / GLYCOLAX Take  17 g by mouth 2 (two) times daily.   QUEtiapine 400 MG tablet Commonly known as: SEROQUEL Take 1 tablet (400 mg total) by mouth 2 (two) times daily.   rosuvastatin 20 MG tablet Commonly known as: CRESTOR Take 1 tablet (20 mg total) by mouth daily.   Vitamin D 50 MCG (2000 UT) tablet Take 1 tablet (2,000 Units total) by mouth daily.        Follow-up Information     Levert Feinstein, MD. Schedule an appointment as soon as possible for a visit in 2 week(s).   Specialty: Neurology Contact information: [address] [place] [address] [phone]         Royals, Gretta Began. Schedule an appointment as soon as possible for a visit in 1 week(s).   Specialty: Family Medicine Contact information: [address] [place] [address] [phone]                 Discharge Instructions     Discharge instructions   Complete by: As directed    You were admitted for being lethargic.   Here, we performed imaging of the brain (which showed nothing new or abnormal) and an EEG (electrical/seizure activity of the brain) which was also normal for you.  You were evaluated by our Neurologist, who recommended continuing your seizure medicines, Depakote 750 mg twice daily and lamotrigine 100 mg nightly  We looked for signs of other causes of being sluggish and weak, and found none (including infection, organ failure, or electrolyte problems)  Here, we did notice that your blood pressure was on the lower side, so we recommend stopping benazepril for now and following up with your primary doctor for a BP check in 1 week   Increase activity slowly   Complete by: As directed        Discharge Exam: Filed Weights   05/25/23 1204  Weight: 73 kg    General: Pt is alert, awake, not in acute distress, appears tired, just woke up Cardiovascular: RRR, nl S1-S2, no murmurs appreciated.   No LE edema.   Respiratory: Normal respiratory rate and rhythm.  CTAB without rales or wheezes. Abdominal: Abdomen soft and non-tender.  No distension or HSM.   Neuro/Psych: Speech dysarthric at baseline.  Cognitive impairment, at baseline.  EOMI and pupils normal, baseline mild left hemiparesis.      Condition at discharge: stable  The results of significant diagnostics from this hospitalization (including imaging, microbiology, ancillary and laboratory) are listed below for reference.   Imaging Studies: EEG adult  Result Date: 2023/05/28 Charlsie Quest, MD     05-28-23 10:34 AM Patient Name: TRIPP BELTRAM MRN: [id number] Epilepsy Attending: Charlsie Quest Referring Physician/Provider: Caryl Pina, MD Date: 05/28/2023 Duration: 24.50 mins Patient history: 67 year old with large right sided encephalomalacia from a prior stroke status post removal of brain tumor many years ago, poststroke epilepsy amongst other comorbidities presented for altered mental status. EEG to evaluate for seizure Level of alertness: Awake AEDs during EEG study: LTG, VPA Technical aspects: This EEG study was done with scalp electrodes positioned according to the 10-20 International system of electrode placement. Electrical activity was reviewed with band pass filter of 1-70Hz , sensitivity of 7 uV/mm, display speed of 28mm/sec with a 60Hz  notched filter applied as appropriate. EEG data were recorded continuously and digitally stored.  Video monitoring was available and reviewed as appropriate. Description: The posterior dominant rhythm consists of 7.5 Hz activity of moderate voltage (25-35 uV)  seen predominantly in posterior head regions, symmetric and reactive to eye opening and eye closing. EEG showed continuous 3 to 6 Hz theta-delta slowing in right frontal region. Photic driving was not seen during photic stimulation. Hyperventilation was not performed.    ABNORMALITY - Continuous slow, right frontal region  IMPRESSION: This study is suggestive of cortical dysfunction arising from right frontal region likely secondary to underlying encephalomalacia. No seizures or epileptiform discharges were seen throughout the recording.  Charlsie Quest   CT Head Wo Contrast  Result Date: 05/25/2023 CLINICAL DATA:  Altered mental status EXAM: CT HEAD WITHOUT CONTRAST TECHNIQUE: Contiguous axial images were obtained from the base of the skull through the vertex without intravenous contrast. RADIATION DOSE REDUCTION: This exam was performed according to the departmental dose-optimization program which includes automated exposure control,  adjustment of the mA and/or kV according to patient size and/or use of iterative reconstruction technique. COMPARISON:  04/09/2023 FINDINGS: Brain: No acute intracranial findings are seen. There is large area of encephalomalacia in the right frontal lobe. There is right frontal craniotomy. There is ex vacuo dilation of right lateral ventricle. There are no signs of bleeding within the cranium. There is no focal mass effect. There are small linear foci of fat attenuation adjacent to the falx suggesting possible small lipomas. Vascular: Unremarkable. Skull: There is right frontal craniotomy. Sinuses/Orbits: There is mucosal thickening in left maxillary sinus. Other: No significant interval changes are noted. IMPRESSION: There is large area of encephalomalacia in the right frontal lobe with no significant change. No acute intracranial findings are seen. Electronically Signed   By: Ernie Avena M.D.   On: 05/25/2023 13:47   DG Chest Port 1 View  Result Date: 05/25/2023 CLINICAL DATA:  Acute mental status change EXAM: PORTABLE CHEST 1 VIEW COMPARISON:  March 15, 2023 FINDINGS: The heart size and mediastinal contours are within normal limits. Both lungs are clear. The visualized skeletal structures are unremarkable. IMPRESSION: No active disease. Electronically Signed   By: Gerome Sam III M.D.   On: 05/25/2023 13:16   EEG adult  Result Date: 05/13/2023 Levert Feinstein, MD     05/13/2023 10:25 PM HISTORY: 67 year old male with history of complex partial seizure TECHNIQUE: This is a routine 16 channel EEG recording with one channel devoted to a limited EKG recording.  It was performed during wakefulness, drowsiness and asleep.  Photic stimulation were performed as activating procedures.  There are minimum muscle and movement artifact noted. Upon maximum arousal, posterior dominant waking rhythm consistent of mildly dysrhythmic theta range activity. Activities are symmetric over the bilateral posterior  derivations and attenuated with eye opening. Photic stimulation did not alter the tracing. Hyperventilation was not performed During EEG recording, patient developed drowsiness and no deeper stage of sleep was achieved During EEG recording, there was no epileptiform discharge noted. EKG demonstrate normal sinus rhythm. CONCLUSION: This is a mild abnormal study due to mild background slowing, indicating mild bihemispheric malfunction. Levert Feinstein, M.D. Ph.D. [facility] [address] [place], [place] [address] Phone: [phone] Fax:      [phone]    Microbiology: Results for orders placed or performed during the hospital encounter of 11/15/20  SARS Coronavirus 2 by RT PCR (hospital order, performed in [facility] hospital lab) Nasopharyngeal Nasopharyngeal Swab     Status: None   Collection Time: 12/06/20  6:50 PM   Specimen: Nasopharyngeal Swab  Result Value Ref Range Status   SARS Coronavirus 2 NEGATIVE NEGATIVE Final    Comment: (NOTE)  SARS-CoV-2 target nucleic acids are NOT DETECTED.  The SARS-CoV-2 RNA is generally detectable in upper and lower respiratory specimens during the acute phase of infection. The lowest concentration of SARS-CoV-2 viral copies this assay can detect is 250 copies / mL. A negative result does not preclude SARS-CoV-2 infection and should not be used as the sole basis for treatment or other patient management decisions.  A negative result may occur with improper specimen collection / handling, submission of specimen other than nasopharyngeal swab, presence of viral mutation(s) within the areas targeted by this assay, and inadequate number of viral copies (<250 copies / mL). A negative result must be combined with clinical observations, patient history, and epidemiological information.  Fact Sheet for Patients:   BoilerBrush.com.cy  Fact Sheet for Healthcare Providers: https://pope.com/  This test  is not yet approved or  cleared by the [place] FDA and has been authorized for detection and/or diagnosis of SARS-CoV-2 by FDA under an Emergency Use Authorization (EUA).  This EUA will remain in effect (meaning this test can be used) for the duration of the COVID-19 declaration under Section 564(b)(1) of the Act, 21 U.S.C. section 360bbb-3(b)(1), unless the authorization is terminated or revoked sooner.  Performed at [facility], [address] [place], [place] [address]     Labs: CBC: Recent Labs  Lab 05/25/23 1427  WBC 3.5*  NEUTROABS 1.1*  HGB 15.0  HCT 45.2  MCV 96.6  PLT 178   Basic Metabolic Panel: Recent Labs  Lab 05/25/23 1427  NA 140  K 4.0  CL 103  CO2 31  GLUCOSE 81  BUN 11  CREATININE 1.24  CALCIUM 9.1   Liver Function Tests: Recent Labs  Lab 05/25/23 1427  AST 20  ALT 16  ALKPHOS 63  BILITOT 0.4  PROT 6.8  ALBUMIN 3.6   CBG: No results for input(s): "GLUCAP" in the last 168 hours.  Discharge time spent: approximately 35 minutes spent on discharge counseling, evaluation of patient on day of discharge, and coordination of discharge planning with nursing, social work, pharmacy and case management  Signed: Alberteen Sam, MD [facility] 05/26/2023

## 2023-05-26 NOTE — Hospital Course (Addendum)
Mr. Erdos is a 67 y.o. 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  In the ER, appeared to be improving but not at basleine.  CTH no change from baseline.  Neurology consulted who recommended observation overnight and EEG.

## 2023-05-26 NOTE — Progress Notes (Signed)
Neurology Progress Note   S:// Patient is awake and alert, lying in bed in NAD. No family at the bedside. He is asking to go home.  EEG Continuous slow, right frontal region likely secondary to underlying encephalomalacia. No seizures or epileptiform discharges were seen throughout the recording. Ammonia level 28, VPA 112, Lamotrigine level pending    O:// Current vital signs: BP 95/65 (BP Location: Left Arm)   Pulse 62   Temp 97.9 F (36.6 C) (Oral)   Resp 20   Ht 5\' 3"  (1.6 m)   Wt 73 kg   SpO2 100%   BMI 28.51 kg/m  Vital signs in last 24 hours: Temp:  [96.6 F (35.9 C)-98.6 F (37 C)] 97.9 F (36.6 C) (06/02 1100) Pulse Rate:  [56-77] 62 (06/02 1100) Resp:  [13-23] 20 (06/02 1100) BP: (86-155)/(54-92) 95/65 (06/02 1100) SpO2:  [94 %-100 %] 100 % (06/02 1100) Weight:  [73 kg] 73 kg (06/01 1204)  GENERAL: Awake, alert in NAD HEENT: - Normocephalic and atraumatic, dry mm LUNGS - Clear to auscultation bilaterally with no wheezes CV - S1S2 RRR, no m/r/g, equal pulses bilaterally. ABDOMEN - Soft, nontender, nondistended with normoactive BS   NEURO:  Mental Status: AA&O self. Can follow simple commands  Language: speech is dysarthric and garbled. Able to name objects and repeat simple words Cranial Nerves: PERRL. EOMI, visual fields full, slight left facial asymmetry, facial sensation intact, hearing intact, tongue/uvula/soft palate midline, normal sternocleidomastoid and trapezius muscle strength. No evidence of tongue atrophy  Motor: Left upper extremity 3/5 and decreased grip  Left lower extremity 2-3/5 with decreased dorsiflexion and extension Right upper and lower 4+/5 Sensation- Intact to light touch bilaterally Coordination: Unable to assess  Gait- Deferred  Medications  Current Facility-Administered Medications:    divalproex (DEPAKOTE SPRINKLE) capsule 750 mg, 750 mg, Oral, BID, Mikey College T, MD, 750 mg at 05/26/23 0954   docusate sodium (COLACE) capsule  100 mg, 100 mg, Oral, BID, Mikey College T, MD, 100 mg at 05/26/23 0954   enoxaparin (LOVENOX) injection 40 mg, 40 mg, Subcutaneous, Q24H, Mikey College T, MD, 40 mg at 05/25/23 2215   feeding supplement (ENSURE ENLIVE / ENSURE PLUS) liquid 237 mL, 237 mL, Oral, TID BM, Mikey College T, MD, 237 mL at 05/26/23 0955   lamoTRIgine (LAMICTAL) tablet 100 mg, 100 mg, Oral, QHS, Mikey College T, MD, 100 mg at 05/25/23 2215   levothyroxine (SYNTHROID) tablet 175 mcg, 175 mcg, Oral, Daily, Mikey College T, MD, 175 mcg at 05/26/23 0459   Oral care mouth rinse, 15 mL, Mouth Rinse, BID, Danford, Earl Lites, MD   pantoprazole (PROTONIX) EC tablet 40 mg, 40 mg, Oral, BID AC, Mikey College T, MD, 40 mg at 05/26/23 0810   polyethylene glycol (MIRALAX / GLYCOLAX) packet 17 g, 17 g, Oral, BID, Mikey College T, MD, 17 g at 05/26/23 0955   QUEtiapine (SEROQUEL) tablet 400 mg, 400 mg, Oral, BID, Mikey College T, MD, 400 mg at 05/26/23 0953   rosuvastatin (CRESTOR) tablet 20 mg, 20 mg, Oral, Daily, Arabella Merles, [facility]  Labs CBC    Component Value Date/Time   WBC 3.5 (L) 05/25/2023 1427   RBC 4.68 05/25/2023 1427   HGB 15.0 05/25/2023 1427   HCT 45.2 05/25/2023 1427   PLT 178 05/25/2023 1427   MCV 96.6 05/25/2023 1427   MCH 32.1 05/25/2023 1427   MCHC 33.2 05/25/2023 1427   RDW 13.0 05/25/2023 1427   LYMPHSABS 2.0 05/25/2023 1427   MONOABS  0.3 05/25/2023 1427   EOSABS 0.1 05/25/2023 1427   BASOSABS 0.0 05/25/2023 1427    CMP     Component Value Date/Time   NA 140 05/25/2023 1427   K 4.0 05/25/2023 1427   CL 103 05/25/2023 1427   CO2 31 05/25/2023 1427   GLUCOSE 81 05/25/2023 1427   BUN 11 05/25/2023 1427   CREATININE 1.24 05/25/2023 1427   CALCIUM 9.1 05/25/2023 1427   PROT 6.8 05/25/2023 1427   ALBUMIN 3.6 05/25/2023 1427   AST 20 05/25/2023 1427   ALT 16 05/25/2023 1427   ALKPHOS 63 05/25/2023 1427   BILITOT 0.4 05/25/2023 1427   GFRNONAA >60 05/25/2023 1427   GFRAA >60 09/07/2020 1212     Lipid  Panel     Component Value Date/Time   CHOL 89 04/10/2023 0419   TRIG 92 04/10/2023 0419   HDL 33 (L) 04/10/2023 0419   CHOLHDL 2.7 04/10/2023 0419   VLDL 18 04/10/2023 0419   LDLCALC 38 04/10/2023 0419      Assessment: 67 year old with a large right sided region of frontal encephalomalacia from a prior stroke and status post removal of brain tumor many years ago, poststroke epilepsy amongst other comorbidities who presented for altered mental status. Concern for ongoing depressed and altered mentation leading to neurological consultation.  - Exam today with left sided weakness consistent with the large area of chronic right frontal encephalomalacia. No clinical seizure activity seen.  - CT head: Large area of encephalomalacia in the right frontal lobe with no significant change. No acute intracranial findings are seen. Images personally reviewed by Neurology - EEG 6/2: This study is suggestive of cortical dysfunction arising from right frontal region likely secondary to underlying encephalomalacia. No seizures or epileptiform discharges were seen throughout the recording. - Of note, he had an identical presentation in April with the following work up at that time: MRI, LTM EEG, B12, TSH.  - DDx for presentation includes hypotension, cognitive fluctuation in the setting of decreased neurological reserve and subclinical seizure with postictal state.    Recommendations: - Seizure precautions  - Continue home Depakote and Lamictal  - Neurology will sign off. Please call with questions or concerns  Gevena Mart DNP, ACNPC-AG  [facility]  Electronically signed: Dr. Caryl Pina

## 2023-05-28 LAB — LAMOTRIGINE LEVEL: Lamotrigine Lvl: 8.4 ug/mL (ref 2.0–20.0)

## 2023-05-29 ENCOUNTER — Encounter: Payer: Self-pay | Admitting: Rehabilitative and Restorative Service Providers"

## 2023-05-29 ENCOUNTER — Ambulatory Visit: Payer: Medicare Other | Admitting: Occupational Therapy

## 2023-05-29 ENCOUNTER — Ambulatory Visit: Payer: Medicare Other | Attending: Internal Medicine | Admitting: Rehabilitative and Restorative Service Providers"

## 2023-05-29 DIAGNOSIS — M6281 Muscle weakness (generalized): Secondary | ICD-10-CM | POA: Insufficient documentation

## 2023-05-29 DIAGNOSIS — I69354 Hemiplegia and hemiparesis following cerebral infarction affecting left non-dominant side: Secondary | ICD-10-CM | POA: Insufficient documentation

## 2023-05-29 DIAGNOSIS — R2689 Other abnormalities of gait and mobility: Secondary | ICD-10-CM | POA: Insufficient documentation

## 2023-05-29 DIAGNOSIS — R262 Difficulty in walking, not elsewhere classified: Secondary | ICD-10-CM | POA: Insufficient documentation

## 2023-05-29 DIAGNOSIS — R278 Other lack of coordination: Secondary | ICD-10-CM | POA: Insufficient documentation

## 2023-05-29 DIAGNOSIS — R2681 Unsteadiness on feet: Secondary | ICD-10-CM | POA: Diagnosis present

## 2023-05-29 NOTE — Therapy (Signed)
OUTPATIENT OCCUPATIONAL THERAPY NEURO  Treatment Note  Patient Name: [name] MRN: [id number] DOB:Aug 22, 1956, 67 y.o., male Today's Date: 05/29/2023  PCP: Katina Dung, Judie Petit REFERRING PROVIDER: Rolly Salter, MD  END OF SESSION:  OT End of Session - 05/29/23 1346     Visit Number 6    Number of Visits 7    Date for OT Re-Evaluation 06/07/23    Authorization Type Medicare A&B    OT Start Time 1151    OT Stop Time 1229    OT Time Calculation (min) 38 min    Activity Tolerance Patient tolerated treatment well;No increased pain;Patient limited by fatigue    Behavior During Therapy Flat affect;WFL for tasks assessed/performed;Impulsive                Past Medical History:  Diagnosis Date   Barrett esophagus    DM II (diabetes mellitus, type II), controlled (HCC) 11/13/2020   Dysphagia    Gastroparesis    GERD (gastroesophageal reflux disease)    Hypercholesteremia    Hypertension    Iron deficiency anemia    Mental retardation    Schizophrenia (HCC)    Seizure (HCC)    Stroke [facility])    Past Surgical History:  Procedure Laterality Date   BIOPSY  10/21/2020   Procedure: BIOPSY;  Surgeon: Iva Boop, MD;  Location: [facility] ENDOSCOPY;  Service: Endoscopy;;   CRANIOTOMY FOR CYST FENESTRATION     ESOPHAGOGASTRODUODENOSCOPY N/A 01/09/2015   Procedure: ESOPHAGOGASTRODUODENOSCOPY (EGD);  Surgeon: Louis Meckel, MD;  Location: [facility] ENDOSCOPY;  Service: Endoscopy;  Laterality: N/A;   ESOPHAGOGASTRODUODENOSCOPY N/A 10/26/2020   Procedure: ESOPHAGOGASTRODUODENOSCOPY (EGD);  Surgeon: Corliss Skains, MD;  Location: [facility] OR;  Service: Thoracic;  Laterality: N/A;   ESOPHAGOGASTRODUODENOSCOPY (EGD) WITH PROPOFOL N/A 10/21/2020   Procedure: ESOPHAGOGASTRODUODENOSCOPY (EGD) WITH PROPOFOL;  Surgeon: Iva Boop, MD;  Location: [facility] ENDOSCOPY;  Service: Endoscopy;  Laterality: N/A;   IR GASTR TUBE CONVERT GASTR-JEJ PER W/FL MOD SED  11/07/2020   PEG PLACEMENT N/A  10/26/2020   Procedure: PERCUTANEOUS ENDOSCOPIC GASTROSTOMY (PEG) PLACEMENT;  Surgeon: Corliss Skains, MD;  Location: [facility] OR;  Service: Thoracic;  Laterality: N/A;   XI ROBOTIC ASSISTED HIATAL HERNIA REPAIR N/A 10/26/2020   Procedure: XI ROBOTIC ASSISTED HIATAL HERNIA REPAIR;  Surgeon: Corliss Skains, MD;  Location: [facility] OR;  Service: Thoracic;  Laterality: N/A;   Patient Active Problem List   Diagnosis Date Noted   Mixed hyperlipidemia 05/26/2023   Intellectual developmental delay 05/26/2023   General weakness 04/09/2023   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) 10/26/2021   Seizure disorder (HCC) 10/26/2021   S/P craniotomy 03/09/2021   Dilation of esophagus    Hyponatremia    Dysphagia    Seizures (HCC)    S/P percutaneous endoscopic gastrostomy (PEG) tube placement (HCC)    Weakness 11/15/2020   Bowel obstruction (HCC)    S/P repair of paraesophageal hernia 10/26/2020   Syncope 10/21/2020   Hiatal hernia 10/21/2020   Acute hypoxemic respiratory failure (HCC) 10/21/2020   Sinus tachycardia 10/21/2020   Gastric polyps    Altered mental status 09/05/2020   Emesis 07/24/2015   Coffee ground emesis    Gastrointestinal hemorrhage associated with other gastritis    Acute esophagitis 01/09/2015   Upper gastrointestinal bleed    Diarrhea    Nausea with vomiting    GI bleed 01/08/2015   GIB (gastrointestinal bleeding) 01/08/2015   Nausea & vomiting  01/08/2015   Reflux esophagitis 07/04/2009   Loss of weight 07/04/2009   BLOOD IN STOOL, OCCULT 07/04/2009   ANEMIA, IRON DEFICIENCY 05/04/2008   Hypothyroidism 04/05/2008   Schizophrenia, unspecified type (HCC) 04/05/2008   Depression 04/05/2008   Hemiplegia (HCC) 04/05/2008   Essential hypertension 04/05/2008   GERD 04/05/2008   Gastroparesis 04/05/2008   Acute metabolic encephalopathy 04/05/2008   History of diverticulosis 08/21/2004   Barrett's esophagus 04/01/2002    HIATAL HERNIA 04/01/2002    ONSET DATE: 04/09/23  REFERRING DIAG: R53.1 (ICD-10-CM) - Left-sided weakness G40.919 (ICD-10-CM) - Breakthrough seizure  THERAPY DIAG:  Hemiplegia and hemiparesis following cerebral infarction affecting left non-dominant side (HCC)  Unsteadiness on feet  Other lack of coordination  Muscle weakness (generalized)  Rationale for Evaluation and Treatment: Rehabilitation  PERTINENT HISTORY: PMH: GERD, Barrett esophagus, gastroparesis, HTN, CVA (large R frontal) with L sided deficits, mental retardation, schizophrenia, hypothyroidism, seizures, benign intracranial tumor s/p craniotomy 2004, dysphagia, HLD "Pt states that his LUE is weak.  Pt reports that he uses a walker and w/c for mobility.  Pt with inconsistent report on PLOF and current status, therefore caregiver present for portion of session to address current status and PLOF.  Pt's caregiver reports that pt's strength is weaker on the L side and that his cognition is different.  Caregiver reports that pt is w/c level since hospitalization and requires +2 for all transfers and self-care tasks."  PRECAUTIONS: Other: seizures;  WEIGHT BEARING RESTRICTIONS: No   SUBJECTIVE:   SUBJECTIVE STATEMENT: Pt reports that he need assist with donning/doffing socks and shoes.  Pt accompanied by: self and caregiver from group home   PAIN:  Are you having pain? No   FALLS: Has patient fallen in last 6 months? No  LIVING ENVIRONMENT: Lives with: lives in an adult home Lives in: House/apartment Stairs: No Has following equipment at home: Dan Humphreys - 4 wheeled, Wheelchair (power), shower chair, and Grab bars  PLOF:  Mod I for toileting, but Min-mod assist for ADLs.  Utilized Rollator for mobility prior to most recent hospitalization  PATIENT GOALS: to have a long life   OBJECTIVE: (All objective assessments below are from initial evaluation on: 04/25/23 unless otherwise specified.)   HAND DOMINANCE:  Right  ADLs: Overall ADLs: +2 assist for all mobility and self-care tasks. +2 for transfers into shower, utilizing shower chair Transfers/ambulation related to ADLs: +2 assist Eating: setup to open containers, but then pt able to feed himself Grooming: total assist UB Dressing: total assist LB Dressing: +2 assist Toileting: +2 assist Bathing: +2 assist Tub Shower transfers: +2 assist, handicap accessible shower with seat Equipment: Shower seat with back and Walk in shower   MOBILITY STATUS:  utilizing w/c at this point for mobility  POSTURE COMMENTS:  rounded shoulders, forward head, and posterior pelvic tilt   UPPER EXTREMITY ROM:  AROM: Pt initially able to lift arm ~20-30% from arm rest, utilizing abduction to achieve ~60* shoulder flexion; however when directed to move to assess ROM pt stating no.  OT attempted to explain purpose of therapy and evaluation to assess ROM, however pt continuing to shake head and state no to further attempts at AROM.  Did require max encouragement to assess PROM.  Passive ROM Left eval  Shoulder flexion 30*  Shoulder abduction 80*  Shoulder adduction   Shoulder extension   Shoulder internal rotation   Shoulder external rotation   Elbow flexion WNL  Elbow extension WNL  Wrist flexion 50%  Wrist extension 25%  Wrist ulnar deviation   Wrist radial deviation   Wrist pronation   Wrist supination   (Blank rows = not tested)   HAND FUNCTION: Unable to assess as pt keeping hand in partial fist.  Pt is able to open digits fully, except for thumb, with increased time and demonstration  COORDINATION: Pt would not attempt to pick any items up with L hand despite multiple attempts and multiple items  SENSATION: Inconsistent report.  Pt reports both hands feel the same, but also reports numbness on L side since stroke  COGNITION: Overall cognitive status: History of cognitive impairments - at baseline  VISION: Subjective report: unable to  assess due to inconsistent report, cognition impacting ability to attend and participate   OBSERVATIONS: Pt with inconsistent report and frequently shaking head when therapist encouraging attempts at AROM for LUE.  Pt did lift LUE spontaneously when attempting to reach towards RW during PT session (OT present as +2 initially).     TODAY'S TREATMENT:                                                                       05/29/23 Transfers: engaged in stand pivot and sit <> stand transfers with min assist.  Pt requiring increased time to initiate movement but able to complete when provided with time and encouragement.  Pt demonstrating decreased placement of LLE when in standing, by placing it slightly in front but able to maintain static and simple dynamic standing balance with close supervision - CGA. LB dressing: simulated LB dressing with use of hula hoop with pt able to lift RLE without issues and increased effort to lift LLE to advance hula hoop over feet.  Pt refused to stand therefore engaged in massed practice with advance hula hoop back and forth under feet.  OT educated on use of sock aid with demonstration.  However pt able to bring RLE into figure 4 position and able to don sock with hemi-technique to open sock and then pull up.  Pt unable to achieve figure 4 with LLE, therefore OT educated on modified circle sitting at EOB to allow for increased ability to reach L foot.  Pt achieving position but then refusing to attempt to don/doff L shoe/sock. Dynamic standing: engaged in standing task with placing pieces into Connect 4 grid, initially focused on use of LUE however pt refusing attempts with LUE therefore focused on maintaining standing balance during structured task.   LUE reaching: engaged in reaching and gross grasp to pick up bean bags with L hand and drop in basket on L side.  Pt initially attempting with BUE, due to difficulty completing only with RUE.  OT then transitioning to providing  cues to open L hand and then pt able to pinch between thumb, index, and long finger to sustain gross grasp and then release.  OT educated on functional carryover of gross grasp to ADLs.    05/22/23 UB dressing: OT demonstrating with use of gait belt and providing with handout for hemi-dressing technique.  Pt attempting to complete with gait belt to simulate dressing, however with decreased carryover and rushing through steps causing pt to get caught up in gait belt.  OT provided pt with XL t-shirt with pt demonstrating improved  sequencing and slowing down to allow for improved independence with donning shift.  Provided handout for caregiver as new caregivers are onboarding at pt's group home.   LUE NMR: engaged in ball toss with dowel in BUE to bat back to therapist.  Pt benefiting from hand over hand on L side to facilitate increased ROM.      05/15/23 UE Ranger: engaged in shoulder flexion with use of UE Ranger.  OT providing support at elbow and under UE ranger hand attachment to facilitate straight trajectory.  Utilized [employer] for [employer] and to facilitate increased engagement.  Pt tolerating shoulder flexion to ~90*.  OT attempted to facilitate increased shoulder flexion with overhead reach with UE Ranger, however unable to tolerate increased wrist extension on hand attachment. Shoulder flexion: OT transitioned away from UE ranger to further attempt shoulder flexion. With attempts at AROM, pt recruiting abduction and with inability to complete true shoulder flexion.  OT then providing support at elbow and wrist to facilitate shoulder flexion.  Pt with increased tightness approaching 60-70* shoulder flexion, tolerating up to 90*.  OT educated caregiver on hand placement to facilitate ROM.  Exercises - Seated Shoulder Flexion PROM with Caregiver  - 1-2 x daily - 2 sets - 5 reps - Seated Shoulder External Rotation and Internal Rotation PROM with Caregiver  - 1-2 x daily - 2 sets - 5-10  reps   PATIENT EDUCATION: Education details: ongoing condition specific education. Person educated: Patient Education method: Explanation Education comprehension: needs further education  HOME EXERCISE PROGRAM: Self-ROM (see pt instructions)  Access Code: [email or web address] URL: [email or web address] Date: 05/15/2023 Prepared by: [facility]   GOALS: Goals reviewed with patient? Yes  SHORT TERM GOALS: Target date: 05/17/23  Pt and caregiver will be independent with simple HEP and functional activities to encourage simple assist from LUE and to increase awareness/attention of LUE. Baseline: Goal status: MET - 05/15/23  2.  Pt will tolerate standing with supervision to allow caregiver to complete clothing management/hygiene for toileting. Baseline:  Goal status: IN PROGRESS  3.  Pt will be able to complete UB dressing with min assist and min cues for hemi-technique. Baseline:  Goal status: MET - 05/22/23   LONG TERM GOALS: Target date: 06/07/23  Pt will complete toilet transfer, stand pivot, with min assist of one caregiver. Baseline:  Goal status: IN PROGRESS  2.  Pt will assist with bathing to complete 50%of task, incorporating use of LUE at gross assist level and with use of AE prn  Baseline:  Goal status: IN PROGRESS  3.  Pt will complete clothing management post toileting with min assist of one caregiver for safety and sequencing. Baseline:  Goal status: IN PROGRESS  4.  Pt will be able to complete LB dressing with mod assist and min cues for hemi-technique. Baseline:  Goal status: IN PROGRESS  ASSESSMENT:  CLINICAL IMPRESSION: Utilizing caregiver throughout during session to facilitate increased engagement and discussion of progress towards goals  Pt continues to have decreased "buy in" with tasks but is able to complete many ADLs at improved level from evaluation.  Pt has progressed from +2 to min assist for sit >  stand and transfers as well as min-mod assist for self-care tasks.  Pt initially stating "one week" when discussing remaining visits and OT discussing possibility of re-cert to continue with therapy for increased progress.  Pt tolerating of cues and hand over hand assistance with simulated dressing tasks.  Pt  preferring to attempt tasks on his own but receptive to assistance from therapist with transfers and sit > stand this session.  PLAN:  OT FREQUENCY: 1x/week  OT DURATION: 6 weeks  PLANNED INTERVENTIONS: self care/ADL training, therapeutic exercise, therapeutic activity, neuromuscular re-education, manual therapy, passive range of motion, balance training, functional mobility training, splinting, ultrasound, paraffin, fluidotherapy, compression bandaging, moist heat, cryotherapy, patient/family education, cognitive remediation/compensation, visual/perceptual remediation/compensation, psychosocial skills training, coping strategies training, and DME and/or AE instructions  RECOMMENDED OTHER SERVICES: SLP eval  CONSULTED AND AGREED WITH PLAN OF CARE: Patient  PLAN FOR NEXT SESSION:  Continue working on exercises and activities to help encourage functional use of the left arm.  Try bimanual tasks- tasks that occupy the right arm and hand therefore making the left arm and hand have to work. Make it fun and allow for breaks. He has decreased tolerance to being touched or manual/PROM therapy.  Initiate more transfers and standing as pt will allow.  Pt would benefit from re-cert and additional visits due to progress towards goals.   Rosalio Loud, OTR/L 05/29/2023, 1:47 PM

## 2023-05-29 NOTE — Therapy (Signed)
OUTPATIENT PHYSICAL THERAPY NEURO TREATMENT  Patient Name: [name] MRN: [id number] DOB:01/16/1956, 67 y.o., male Today's Date: 05/29/2023  PCP: Lucretia Field REFERRING PROVIDER: Rolly Salter, MD  END OF SESSION:  PT End of Session - 05/29/23 1055     Visit Number 6    Number of Visits 7    Date for PT Re-Evaluation 06/06/23    Authorization Type Medicare/Medicaid    PT Start Time 1100    PT Stop Time 1145    PT Time Calculation (min) 45 min    Equipment Utilized During Treatment Gait belt    Activity Tolerance Patient tolerated treatment well    Behavior During Therapy Impulsive            Past Medical History:  Diagnosis Date   Barrett esophagus    DM II (diabetes mellitus, type II), controlled (HCC) 11/13/2020   Dysphagia    Gastroparesis    GERD (gastroesophageal reflux disease)    Hypercholesteremia    Hypertension    Iron deficiency anemia    Mental retardation    Schizophrenia (HCC)    Seizure (HCC)    Stroke [facility])    Past Surgical History:  Procedure Laterality Date   BIOPSY  10/21/2020   Procedure: BIOPSY;  Surgeon: Iva Boop, MD;  Location: [facility] ENDOSCOPY;  Service: Endoscopy;;   CRANIOTOMY FOR CYST FENESTRATION     ESOPHAGOGASTRODUODENOSCOPY N/A 01/09/2015   Procedure: ESOPHAGOGASTRODUODENOSCOPY (EGD);  Surgeon: Louis Meckel, MD;  Location: [facility] ENDOSCOPY;  Service: Endoscopy;  Laterality: N/A;   ESOPHAGOGASTRODUODENOSCOPY N/A 10/26/2020   Procedure: ESOPHAGOGASTRODUODENOSCOPY (EGD);  Surgeon: Corliss Skains, MD;  Location: [facility] OR;  Service: Thoracic;  Laterality: N/A;   ESOPHAGOGASTRODUODENOSCOPY (EGD) WITH PROPOFOL N/A 10/21/2020   Procedure: ESOPHAGOGASTRODUODENOSCOPY (EGD) WITH PROPOFOL;  Surgeon: Iva Boop, MD;  Location: [facility] ENDOSCOPY;  Service: Endoscopy;  Laterality: N/A;   IR GASTR TUBE CONVERT GASTR-JEJ PER W/FL MOD SED  11/07/2020   PEG PLACEMENT N/A 10/26/2020   Procedure: PERCUTANEOUS ENDOSCOPIC GASTROSTOMY  (PEG) PLACEMENT;  Surgeon: Corliss Skains, MD;  Location: [facility] OR;  Service: Thoracic;  Laterality: N/A;   XI ROBOTIC ASSISTED HIATAL HERNIA REPAIR N/A 10/26/2020   Procedure: XI ROBOTIC ASSISTED HIATAL HERNIA REPAIR;  Surgeon: Corliss Skains, MD;  Location: [facility] OR;  Service: Thoracic;  Laterality: N/A;   Patient Active Problem List   Diagnosis Date Noted   Mixed hyperlipidemia 05/26/2023   Intellectual developmental delay 05/26/2023   General weakness 04/09/2023   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) 10/26/2021   Seizure disorder (HCC) 10/26/2021   S/P craniotomy 03/09/2021   Dilation of esophagus    Hyponatremia    Dysphagia    Seizures (HCC)    S/P percutaneous endoscopic gastrostomy (PEG) tube placement (HCC)    Weakness 11/15/2020   Bowel obstruction (HCC)    S/P repair of paraesophageal hernia 10/26/2020   Syncope 10/21/2020   Hiatal hernia 10/21/2020   Acute hypoxemic respiratory failure (HCC) 10/21/2020   Sinus tachycardia 10/21/2020   Gastric polyps    Altered mental status 09/05/2020   Emesis 07/24/2015   Coffee ground emesis    Gastrointestinal hemorrhage associated with other gastritis    Acute esophagitis 01/09/2015   Upper gastrointestinal bleed    Diarrhea    Nausea with vomiting    GI bleed 01/08/2015   GIB (gastrointestinal bleeding) 01/08/2015   Nausea & vomiting 01/08/2015   Reflux esophagitis 07/04/2009  Loss of weight 07/04/2009   BLOOD IN STOOL, OCCULT 07/04/2009   ANEMIA, IRON DEFICIENCY 05/04/2008   Hypothyroidism 04/05/2008   Schizophrenia, unspecified type (HCC) 04/05/2008   Depression 04/05/2008   Hemiplegia (HCC) 04/05/2008   Essential hypertension 04/05/2008   GERD 04/05/2008   Gastroparesis 04/05/2008   Acute metabolic encephalopathy 04/05/2008   History of diverticulosis 08/21/2004   Barrett's esophagus 04/01/2002   HIATAL HERNIA 04/01/2002    ONSET DATE:  04/09/23 REFERRING DIAG: R53.1 (ICD-10-CM) - Left-sided weakness G40.919 (ICD-10-CM) - Breakthrough seizure (HCC) THERAPY DIAG:  Hemiplegia and hemiparesis following cerebral infarction affecting left non-dominant side (HCC)  Unsteadiness on feet  Other abnormalities of gait and mobility  Rationale for Evaluation and Treatment: Rehabilitation  SUBJECTIVE:                                                                                                                                                                                             SUBJECTIVE STATEMENT: The patient's caregiver reports that he was pulling up on the grab bar prior to going to the hospital on Saturday. He continues to seem more fatigued since then.   Pt accompanied by: self, caregiver -[name]  PERTINENT HISTORY: DMII, GERD, HLD, HTN, anemia, schizophrenia, seizure, stroke with L hemiparesis, benign intracranial tumor s/p craniotomy 2004, dysphagia, cognitive deficit   PAIN:  Are you having pain? No  PRECAUTIONS: Fall and Other: seizures  WEIGHT BEARING RESTRICTIONS: No  FALLS: Has patient fallen in last 6 months? No  LIVING ENVIRONMENT: Per OT's note as caregiver not present for PT session:  Lives with: lives in an adult home Lives in: House/apartment Stairs: No Has following equipment at home: Dan Humphreys - 4 wheeled, Wheelchair (power), shower chair, and Grab bars   PLOF:  Mod I for toileting, but Min-mod assist for ADLs.  Utilized Rollator for mobility prior to most recent hospitalization  PATIENT GOALS: pt agrees that he wants to work on moving better   OBJECTIVE:   [facility] Adult PT Treatment:                                                DATE: 05/29/23 Therapeutic Exercise: LAQ x 5 reps aiming to hand as target with more encouragement needed for L LE Sitting stomping to get a march R and L sides Seated HS stretch L LE in w/c with cues Neuromuscular re-ed: Standing weight shifting L and R working on  engaging L quad to engage L LE for standing Worked  on reaching from seated to anteriorly placed physioball for nose over toes and trunk flexion needed to initiate standing Standing with UE support in parallel bars working on L/R weight shifting Sidestepping x 3 steps R and L in parallel bars Standing x 3 minutes in parallel bars with CGA *Patient needs cues to wait for PT and CGA to min A for safety for all standing activities Therapeutic Activity: Sit>Stand x 3 reps in parallel bars working on sequencing for checking w/c locks, scooting, and moving nose forward to stand. Gait: Ambulated with L PFRW x 4 steps, turn and 3 steps with min A L LE stops advancing after short distance ambulation and patient requests rest Wearing L AFO  HOME EXERCISE PROGRAM: Access Code: [email or web address] URL: [email or web address] Date: 04/25/2023 Prepared by: [facility] - Outpatient  Rehab - [facility]  Exercises - Seated March  - 1 x daily - 5 x weekly - 2 sets - 10 reps - Seated Long Arc Quad  - 1 x daily - 5 x weekly - 2 sets - 10 reps - Seated Hip Adduction Isometrics with Ball  - 1 x daily - 5 x weekly - 2 sets - 10 reps - 3 sec hold - Sit to Stand with Counter Support  - 1 x daily - 5 x weekly - 2 sets - 10 reps  PATIENT EDUCATION: Caregiver/patient educated in use of platform and requisite safety to prepare for home use  ____________________________________________________________________________________________________________________________________ Below measures were taken at time of initial evaluation unless otherwise specified: DIAGNOSTIC FINDINGS: 04/10/23 brain MRI: No acute intracranial process. Unchanged resection cavity with surrounding encephalomalacia and gliosis in the right frontal lobe. COGNITION: Overall cognitive status: History of cognitive impairments - at baseline SENSATION: Intact in B Les to light touch COORDINATION: Alternating toe tap: pt lifting R and L LE  with hip flexors rather than DF; limited motion on L MUSCLE TONE: appears intact in L LE but limited by cognition/understanding on R LE d/t tendency to try to perform AROM POSTURE: posterior pelvic tilt and rounded spine in w/c, head bend downward and to the R LOWER EXTREMITY MMT:    MMT Right Eval Left Eval  Hip flexion 4 3  Hip extension    Hip abduction 4 2  Hip adduction 4 2  Hip internal rotation    Hip external rotation    Knee flexion 4+ 1  Knee extension 4+ 4-  Ankle dorsiflexion 4 0 (unable to elicit contraction but unsure if limited by cognition)  Ankle plantarflexion 4 0  (unable to elicit contraction but unsure if limited by cognition)  Ankle inversion    Ankle eversion     (Blank rows = not tested) LOWER EXTREMITY MMT:   AROM Right Eval Left Eval  Hip flexion    Hip extension    Hip abduction    Hip adduction    Hip internal rotation    Hip external rotation    Knee flexion    Knee extension    Ankle dorsiflexion 6 -15  Ankle plantarflexion    Ankle inversion    Ankle eversion    (Blank rows = not tested) TRANSFERS: Assistive device utilized: Environmental consultant - 2 wheeled  Sit to stand: Min A and x2 Stand to sit: Min A and x2 Pt able to perform marching with R LE but unable to move L LE; required assist in managing L LE on RW GAIT: Gait pattern:  pt unable today Trialed self-w/c propulsion with R UE but with  limited ability and path deviation  GOALS: Goals reviewed with patient? Yes  SHORT TERM GOALS: Target date: 05/16/2023  Patient to perform initial HEP with caregiver assist. Baseline: HEP initiated Goal status: has been prescribed-- unsure of compliance with group home 05/29/23  LONG TERM GOALS: Target date: 06/06/2023  Patient to perform advanced HEP with caregiver assist. Baseline: Not yet initiated  Goal status: IN PROGRESS  Patient to demonstrate L LE strength >/=3/5.  Baseline: See above Goal status: IN PROGRESS  Patient to demonstrate improved  head, pelvic, and spinal position in w/c,  Baseline: rounded posture, head drooping down Goal status: IN PROGRESS  Patient to perform sit to stand transfer with supervision and LRAD.   Baseline: min A x 2 with RW Goal status: IN PROGRESS  Patient to ambulate 30 feet with LRAD and CGA.  Baseline: unable Goal status: IN PROGRESS  ASSESSMENT: CLINICAL IMPRESSION: The patient went to ED over the weekend per [facility] and caregiver report. His caregiver notes less movement of the left side since then--- medical evaluation performed at ED and patient not admitted.  At today's session, he is able move into standing with parallel bar or to RW with platform L with SBA to CGA. For gait, he does need min A and the L LE stops advancing after short distances.  Patient needs encouragement to continue participating once fatigued. Cont with POC-- will talk with caregivers to determine renewal versus d/c.   OBJECTIVE IMPAIRMENTS: Abnormal gait, decreased activity tolerance, decreased balance, decreased mobility, difficulty walking, decreased ROM, decreased strength, impaired flexibility, improper body mechanics, and postural dysfunction.   PLAN:  PT FREQUENCY: 1x/week  PT DURATION: 6 weeks  PLANNED INTERVENTIONS: Therapeutic exercises, Therapeutic activity, Neuromuscular re-education, Balance training, Gait training, Patient/Family education, Self Care, Joint mobilization, Stair training, Vestibular training, Canalith repositioning, Orthotic/Fit training, DME instructions, Aquatic Therapy, Dry Needling, Electrical stimulation, Cryotherapy, Moist heat, Taping, Manual therapy, and Re-evaluation  PLAN FOR NEXT SESSION: CHECK LONG TERM GOALS next visit. Continue STS transfers at [facility] or parallel bars, gait and balance training, wt shifting onto LLE   05/29/2023 Dennie Vecchio, PT

## 2023-06-04 ENCOUNTER — Emergency Department (HOSPITAL_COMMUNITY): Payer: Medicare Other

## 2023-06-04 ENCOUNTER — Other Ambulatory Visit: Payer: Self-pay

## 2023-06-04 ENCOUNTER — Observation Stay (HOSPITAL_COMMUNITY)
Admission: EM | Admit: 2023-06-04 | Discharge: 2023-06-06 | Disposition: A | Discharge status: Home / Self Care | Payer: Medicare Other | Attending: Internal Medicine | Admitting: Internal Medicine

## 2023-06-04 DIAGNOSIS — G9341 Metabolic encephalopathy: Secondary | ICD-10-CM | POA: Diagnosis present

## 2023-06-04 DIAGNOSIS — N179 Acute kidney failure, unspecified: Secondary | ICD-10-CM | POA: Diagnosis not present

## 2023-06-04 DIAGNOSIS — Z79899 Other long term (current) drug therapy: Secondary | ICD-10-CM | POA: Diagnosis not present

## 2023-06-04 DIAGNOSIS — R4781 Slurred speech: Secondary | ICD-10-CM

## 2023-06-04 DIAGNOSIS — I10 Essential (primary) hypertension: Secondary | ICD-10-CM | POA: Insufficient documentation

## 2023-06-04 DIAGNOSIS — E039 Hypothyroidism, unspecified: Secondary | ICD-10-CM | POA: Diagnosis not present

## 2023-06-04 DIAGNOSIS — R29818 Other symptoms and signs involving the nervous system: Principal | ICD-10-CM | POA: Insufficient documentation

## 2023-06-04 DIAGNOSIS — Z8673 Personal history of transient ischemic attack (TIA), and cerebral infarction without residual deficits: Secondary | ICD-10-CM | POA: Diagnosis not present

## 2023-06-04 DIAGNOSIS — R4182 Altered mental status, unspecified: Secondary | ICD-10-CM

## 2023-06-04 DIAGNOSIS — G9340 Encephalopathy, unspecified: Secondary | ICD-10-CM

## 2023-06-04 DIAGNOSIS — E119 Type 2 diabetes mellitus without complications: Secondary | ICD-10-CM | POA: Diagnosis not present

## 2023-06-04 DIAGNOSIS — R2990 Unspecified symptoms and signs involving the nervous system: Secondary | ICD-10-CM | POA: Diagnosis present

## 2023-06-04 LAB — CBC
HCT: 43.7 % (ref 39.0–52.0)
Hemoglobin: 14.5 g/dL (ref 13.0–17.0)
MCH: 32.8 pg (ref 26.0–34.0)
MCHC: 33.2 g/dL (ref 30.0–36.0)
MCV: 98.9 fL (ref 80.0–100.0)
Platelets: 161 10*3/uL (ref 150–400)
RBC: 4.42 MIL/uL (ref 4.22–5.81)
RDW: 12.4 % (ref 11.5–15.5)
WBC: 4 10*3/uL (ref 4.0–10.5)
nRBC: 0 % (ref 0.0–0.2)

## 2023-06-04 LAB — DIFFERENTIAL
Abs Immature Granulocytes: 0.02 10*3/uL (ref 0.00–0.07)
Basophils Absolute: 0 10*3/uL (ref 0.0–0.1)
Basophils Relative: 1 %
Eosinophils Absolute: 0.1 10*3/uL (ref 0.0–0.5)
Eosinophils Relative: 2 %
Immature Granulocytes: 1 %
Lymphocytes Relative: 44 %
Lymphs Abs: 1.8 10*3/uL (ref 0.7–4.0)
Monocytes Absolute: 0.4 10*3/uL (ref 0.1–1.0)
Monocytes Relative: 10 %
Neutro Abs: 1.7 10*3/uL (ref 1.7–7.7)
Neutrophils Relative %: 42 %

## 2023-06-04 LAB — COMPREHENSIVE METABOLIC PANEL
ALT: 13 U/L (ref 0–44)
AST: 21 U/L (ref 15–41)
Albumin: 3.5 g/dL (ref 3.5–5.0)
Alkaline Phosphatase: 62 U/L (ref 38–126)
Anion gap: 9 (ref 5–15)
BUN: 12 mg/dL (ref 8–23)
CO2: 25 mmol/L (ref 22–32)
Calcium: 8.8 mg/dL — ABNORMAL LOW (ref 8.9–10.3)
Chloride: 103 mmol/L (ref 98–111)
Creatinine, Ser: 1.61 mg/dL — ABNORMAL HIGH (ref 0.61–1.24)
GFR, Estimated: 47 mL/min — ABNORMAL LOW (ref >60)
Glucose, Bld: 111 mg/dL — ABNORMAL HIGH (ref 70–99)
Potassium: 4.2 mmol/L (ref 3.5–5.1)
Sodium: 137 mmol/L (ref 135–145)
Total Bilirubin: 0.3 mg/dL (ref 0.3–1.2)
Total Protein: 6.3 g/dL — ABNORMAL LOW (ref 6.5–8.1)

## 2023-06-04 LAB — I-STAT CHEM 8, ED
BUN: 13 mg/dL (ref 8–23)
Calcium, Ion: 1.12 mmol/L — ABNORMAL LOW (ref 1.15–1.40)
Chloride: 104 mmol/L (ref 98–111)
Creatinine, Ser: 1.6 mg/dL — ABNORMAL HIGH (ref 0.61–1.24)
Glucose, Bld: 106 mg/dL — ABNORMAL HIGH (ref 70–99)
HCT: 44 % (ref 39.0–52.0)
Hemoglobin: 15 g/dL (ref 13.0–17.0)
Potassium: 4.1 mmol/L (ref 3.5–5.1)
Sodium: 140 mmol/L (ref 135–145)
TCO2: 28 mmol/L (ref 22–32)

## 2023-06-04 LAB — VALPROIC ACID LEVEL: Valproic Acid Lvl: 49 ug/mL — ABNORMAL LOW (ref 50.0–100.0)

## 2023-06-04 LAB — APTT: aPTT: 28 seconds (ref 24–36)

## 2023-06-04 LAB — ETHANOL: Alcohol, Ethyl (B): <10 mg/dL (ref <10)

## 2023-06-04 LAB — PROTIME-INR
INR: 1.1 (ref 0.8–1.2)
Prothrombin Time: 14.2 seconds (ref 11.4–15.2)

## 2023-06-04 NOTE — ED Provider Notes (Signed)
[place] EMERGENCY DEPARTMENT AT [facility] Provider Note   CSN: [id number] Arrival date & time: 06/04/23  2133  An emergency department physician performed an initial assessment on this suspected stroke patient at 2135.  History Chief Complaint  Patient presents with   Code Stroke    HPI [name] is a 67 y.o. male presenting for chief complaint of altered mental status.  Patient coming from facility/group home, altered on arrival.  Last well at dinner.  Activated a code stroke for slurred speech.   Patient's recorded medical, surgical, social, medication list and allergies were reviewed in the Snapshot window as part of the initial history.   Review of Systems   Review of Systems  Constitutional:  Negative for chills and fever.  HENT:  Negative for ear pain and sore throat.   Eyes:  Negative for pain and visual disturbance.  Respiratory:  Negative for cough and shortness of breath.   Cardiovascular:  Negative for chest pain and palpitations.  Gastrointestinal:  Negative for abdominal pain and vomiting.  Genitourinary:  Negative for dysuria and hematuria.  Musculoskeletal:  Negative for arthralgias and back pain.  Skin:  Negative for color change and rash.  Neurological:  Negative for seizures and syncope.  All other systems reviewed and are negative.   Physical Exam Updated Vital Signs BP 103/68   Pulse 63   Temp (!) 96.3 F (35.7 C) (Tympanic) Comment: Warm blankets applied  Resp (!) 23   Wt 66.2 kg   SpO2 98%   BMI 25.85 kg/m  Physical Exam Vitals and nursing note reviewed.  Constitutional:      General: He is not in acute distress.    Appearance: He is well-developed.  HENT:     Head: Normocephalic and atraumatic.  Eyes:     Conjunctiva/sclera: Conjunctivae normal.  Cardiovascular:     Rate and Rhythm: Normal rate and regular rhythm.     Heart sounds: No murmur heard. Pulmonary:     Effort: Pulmonary effort is normal. No  respiratory distress.     Breath sounds: Normal breath sounds.  Abdominal:     Palpations: Abdomen is soft.     Tenderness: There is no abdominal tenderness.  Musculoskeletal:        General: No swelling.     Cervical back: Neck supple.  Skin:    General: Skin is warm and dry.     Capillary Refill: Capillary refill takes less than 2 seconds.  Neurological:     Mental Status: He is alert.  Psychiatric:        Mood and Affect: Mood normal.      ED Course/ Medical Decision Making/ A&P    Procedures Procedures   Medications Ordered in ED Medications  lactated ringers bolus 500 mL (500 mLs Intravenous New Bag/Given 06/05/23 0050)   Medical Decision Making:    [name] is a 67 y.o. male  who presented to the ED today with AMS/Slurred speech.  Due to these symptoms, nursing activated a CODE STROKE per hospital protocol.    On my initial exam, the pt was in no acute distress, glucose was WNL and deficits include dizziness.  Deficits are  persistent.   Reviewed and confirmed nursing documentation for past medical history, family history, social history.   Initial Assessment and Plan:   Patient immediately evaluated jointly by neurology and emergency department providers.   This is most consistent with an acute life/limb threatening illness complicated by underlying chronic  conditions.  Patient's HPI and PE findings are most consistent with possible CVA. Proceeded with CTH  per institutional policy which revealed NAA.   Initial Study Results: Labs Labs reviewed without evidence of clinically relevant abnormality.  EKG EKG was reviewed independently. Rate, rhythm, axis, intervals all examined and without medically relevant abnormality. ST segments without concerns for elevations.    Radiology  Images reviewed independently, agree with radiology report at this time.   CT HEAD CODE STROKE WO CONTRAST  Final Result        Final Assessment and Plan:   Pending  recommendations of neurologic providers at time of handoff to oncoming team.  Uncertain whether this is epileptic in nature, metabolic in nature or vascular in etiology. Final recommendations pending at time of handoff to oncoming team.   Clinical Impression:  1. Slurred speech   2. Altered mental status, unspecified altered mental status type      Data Unavailable   Final Clinical Impression(s) / ED Diagnoses Final diagnoses:  Slurred speech  Altered mental status, unspecified altered mental status type    Rx / DC Orders ED Discharge Orders     None         Glyn Ade, MD 06/05/23 [phone]

## 2023-06-04 NOTE — Consult Note (Incomplete)
Neurology Consultation Reason for Consult: Altered mental status Referring Physician: Countryman, C  CC: All  History is obtained from: Altered mental status  HPI: [name] is a 67 y.o. male with a history of seizures, as well as recent admission for altered mental status who presents with recurrent altered mental status.  He was in his normal state of health earlier, and was given his medications, including Seroquel and Depakote he subsequently was found to be very confused.  He was brought in the emergency department and on arrival, he appeared very encephalopathic.  He was arousable, and when aroused he would answer questions, but without direct noxious stimulation, he was very dysarthric, though he cleared up as he awoke.   LKW: 1930 tnk given?: no, no clear new focal finding Past Medical History:  Diagnosis Date   Barrett esophagus    DM II (diabetes mellitus, type II), controlled (HCC) 11/13/2020   Dysphagia    Gastroparesis    GERD (gastroesophageal reflux disease)    Hypercholesteremia    Hypertension    Iron deficiency anemia    Mental retardation    Schizophrenia (HCC)    Seizure (HCC)    Stroke (HCC)      Family History  Problem Relation Age of Onset   Breast cancer Mother    Heart disease Father      Social History:  reports that he has never smoked. He has never used smokeless tobacco. He reports that he does not drink alcohol and does not use drugs.   Exam: Current vital signs: Wt 66.2 kg   BMI 25.85 kg/m  Vital signs in last 24 hours: Weight:  [66.2 kg] 66.2 kg (06/11 2100)   Physical Exam  Appears well-developed and well-nourished.   Neuro: Mental Status: Patient is obtunded, but with repeated noxious stimulation, I am able to get him to arouse and answer questions.  He is oriented to place, person, gives the month is December, unable to give year Patient is able to give a clear and coherent history. No signs of aphasia or  neglect Cranial Nerves: II: Visual Fields are full. Pupils are equal, round, and reactive to light.   III,IV, VI: EOMI without ptosis or diploplia.  V: Facial sensation is symmetric to temperature VII: Facial movement is symmetric.  VIII: hearing is intact to voice X: Uvula elevates symmetrically XI: Shoulder shrug is symmetric. XII: tongue is midline without atrophy or fasciculations.  Motor: He does not cooperate well with formal testing, but least 3/5 strength in the left upper extremity, he does withdrawal to neck stimulation in the left lower extremity, he moves his right upper extremity against gravity and withdraws more briskly on the right than left  sensory: He does withdrawal to neck stimulation in all four extremities Cerebellar: Does not perform     I have reviewed labs in epic and the results pertinent to this consultation are: Creatinine 1.61  I have reviewed the images obtained: Right encephalomalacia  Impression: 67 year old male with abrupt worsening of mental status shortly after receiving medications.  He is on multiple neuroactive medications including Seroquel, lamotrigine, Depakote.  Lamotrigine is not particularly sedating, and his Depakote level is not particular high.  He is on a very high dose of Seroquel, and I wonder if this could be playing a role, particular with his AKI but it is not really renally excreted and therefore I am not certain AKI would really affect it.  His seizures have not historically significantly affected his  mental status, and postictal states are not characteristic for him.  Therefore I think that the postictal state is less likely than I initially suspected.  Recommendations: 1) lamotrigine level 2) continue home Depakote and lamotrigine doses 3) I would consider reducing his Seroquel until his mental status improves. 4) EEG 5) MRI 6) ammonia, blood gas  Ritta Slot, MD [facility] [phone]  If 7pm-  7am, please page neurology on call as listed in AMION.

## 2023-06-04 NOTE — ED Notes (Signed)
Family updated as to patient's status.

## 2023-06-04 NOTE — ED Notes (Signed)
IV team at bedside 

## 2023-06-04 NOTE — ED Triage Notes (Signed)
Pt BIB [facility] from group home as code stroke.  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   LKW 1930  EMS VS: 108/62, HR: 67, 92% RA, cbg 144. Ems not able to obtain iv access.

## 2023-06-05 ENCOUNTER — Observation Stay (HOSPITAL_COMMUNITY): Payer: Medicare Other

## 2023-06-05 ENCOUNTER — Observation Stay (HOSPITAL_BASED_OUTPATIENT_CLINIC_OR_DEPARTMENT_OTHER): Payer: Medicare Other

## 2023-06-05 ENCOUNTER — Encounter (HOSPITAL_COMMUNITY): Payer: Self-pay | Admitting: Internal Medicine

## 2023-06-05 ENCOUNTER — Emergency Department (HOSPITAL_COMMUNITY): Payer: Medicare Other

## 2023-06-05 DIAGNOSIS — R29818 Other symptoms and signs involving the nervous system: Secondary | ICD-10-CM | POA: Diagnosis not present

## 2023-06-05 DIAGNOSIS — R569 Unspecified convulsions: Secondary | ICD-10-CM | POA: Diagnosis not present

## 2023-06-05 DIAGNOSIS — G459 Transient cerebral ischemic attack, unspecified: Secondary | ICD-10-CM | POA: Diagnosis not present

## 2023-06-05 DIAGNOSIS — R4781 Slurred speech: Secondary | ICD-10-CM | POA: Diagnosis not present

## 2023-06-05 DIAGNOSIS — R2990 Unspecified symptoms and signs involving the nervous system: Secondary | ICD-10-CM

## 2023-06-05 DIAGNOSIS — R825 Elevated urine levels of drugs, medicaments and biological substances: Secondary | ICD-10-CM | POA: Diagnosis not present

## 2023-06-05 DIAGNOSIS — G9340 Encephalopathy, unspecified: Secondary | ICD-10-CM

## 2023-06-05 DIAGNOSIS — R4182 Altered mental status, unspecified: Secondary | ICD-10-CM

## 2023-06-05 LAB — ECHOCARDIOGRAM COMPLETE
AR max vel: 1.45 cm2
AV Area VTI: 1.47 cm2
AV Area mean vel: 1.46 cm2
AV Mean grad: 3.5 mmHg
AV Peak grad: 6.6 mmHg
Ao pk vel: 1.29 m/s
Area-P 1/2: 3.56 cm2
Calc EF: 64.6 %
MV VTI: 1.71 cm2
S' Lateral: 2.4 cm
Single Plane A2C EF: 65.8 %
Single Plane A4C EF: 63.1 %
Weight: 2335.11 oz

## 2023-06-05 LAB — TROPONIN I (HIGH SENSITIVITY)
Troponin I (High Sensitivity): 3 ng/L (ref <18)
Troponin I (High Sensitivity): 4 ng/L (ref <18)

## 2023-06-05 LAB — URINALYSIS, ROUTINE W REFLEX MICROSCOPIC
Bilirubin Urine: NEGATIVE
Glucose, UA: NEGATIVE mg/dL
Hgb urine dipstick: NEGATIVE
Ketones, ur: NEGATIVE mg/dL
Leukocytes,Ua: NEGATIVE
Nitrite: NEGATIVE
Protein, ur: NEGATIVE mg/dL
Specific Gravity, Urine: 1.008 (ref 1.005–1.030)
pH: 7 (ref 5.0–8.0)

## 2023-06-05 LAB — I-STAT ARTERIAL BLOOD GAS, ED
Acid-Base Excess: 2 mmol/L (ref 0.0–2.0)
Bicarbonate: 26.6 mmol/L (ref 20.0–28.0)
Calcium, Ion: 1.24 mmol/L (ref 1.15–1.40)
HCT: 40 % (ref 39.0–52.0)
Hemoglobin: 13.6 g/dL (ref 13.0–17.0)
O2 Saturation: 94 %
Patient temperature: 97.8
Potassium: 4.1 mmol/L (ref 3.5–5.1)
Sodium: 141 mmol/L (ref 135–145)
TCO2: 28 mmol/L (ref 22–32)
pCO2 arterial: 41.9 mmHg (ref 32–48)
pH, Arterial: 7.409 (ref 7.35–7.45)
pO2, Arterial: 69 mmHg — ABNORMAL LOW (ref 83–108)

## 2023-06-05 LAB — RAPID URINE DRUG SCREEN, HOSP PERFORMED
Amphetamines: NOT DETECTED
Barbiturates: NOT DETECTED
Benzodiazepines: POSITIVE — AB
Cocaine: NOT DETECTED
Opiates: NOT DETECTED
Tetrahydrocannabinol: NOT DETECTED

## 2023-06-05 LAB — TSH: TSH: 5.305 u[IU]/mL — ABNORMAL HIGH (ref 0.350–4.500)

## 2023-06-05 LAB — AMMONIA: Ammonia: 21 umol/L (ref 9–35)

## 2023-06-05 MED ORDER — METOPROLOL TARTRATE 12.5 MG HALF TABLET
12.5000 mg | ORAL_TABLET | Freq: Two times a day (BID) | ORAL | Status: DC
  Administered 2023-06-05 – 2023-06-06 (×2): 12.5 mg via ORAL
  Filled 2023-06-05 (×2): qty 1

## 2023-06-05 MED ORDER — ACETAMINOPHEN 325 MG PO TABS
650.0000 mg | ORAL_TABLET | Freq: Four times a day (QID) | ORAL | Status: DC | PRN
  Administered 2023-06-05: 650 mg via ORAL
  Filled 2023-06-05: qty 2

## 2023-06-05 MED ORDER — PANTOPRAZOLE SODIUM 40 MG PO TBEC
40.0000 mg | DELAYED_RELEASE_TABLET | Freq: Two times a day (BID) | ORAL | Status: DC
  Administered 2023-06-05 – 2023-06-06 (×2): 40 mg via ORAL
  Filled 2023-06-05 (×2): qty 1

## 2023-06-05 MED ORDER — ONDANSETRON HCL 4 MG/2ML IJ SOLN: 4.0000 mg | Freq: Four times a day (QID) | INTRAMUSCULAR | Status: DC | PRN

## 2023-06-05 MED ORDER — LAMOTRIGINE 25 MG PO TABS: 100.0000 mg | ORAL_TABLET | Freq: Every day | ORAL | Status: DC

## 2023-06-05 MED ORDER — ROSUVASTATIN CALCIUM 20 MG PO TABS
20.0000 mg | ORAL_TABLET | Freq: Every day | ORAL | Status: DC
  Administered 2023-06-06: 20 mg via ORAL
  Filled 2023-06-05: qty 1

## 2023-06-05 MED ORDER — LACTATED RINGERS IV SOLN: INTRAVENOUS | Status: DC

## 2023-06-05 MED ORDER — LAMOTRIGINE 100 MG PO TABS
100.0000 mg | ORAL_TABLET | Freq: Every day | ORAL | Status: DC
  Administered 2023-06-05: 100 mg via ORAL
  Filled 2023-06-05: qty 1

## 2023-06-05 MED ORDER — LACTATED RINGERS IV BOLUS
500.0000 mL | Freq: Once | INTRAVENOUS | Status: AC
  Administered 2023-06-05: 500 mL via INTRAVENOUS

## 2023-06-05 MED ORDER — DIVALPROEX SODIUM 250 MG PO DR TAB
500.0000 mg | DELAYED_RELEASE_TABLET | Freq: Two times a day (BID) | ORAL | Status: DC
  Administered 2023-06-05 – 2023-06-06 (×2): 500 mg via ORAL
  Filled 2023-06-05 (×2): qty 2

## 2023-06-05 MED ORDER — ONDANSETRON HCL 4 MG PO TABS: 4.0000 mg | ORAL_TABLET | Freq: Four times a day (QID) | ORAL | Status: DC | PRN

## 2023-06-05 MED ORDER — SODIUM CHLORIDE 0.9% FLUSH
3.0000 mL | Freq: Two times a day (BID) | INTRAVENOUS | Status: DC
  Administered 2023-06-05: 3 mL via INTRAVENOUS

## 2023-06-05 MED ORDER — DOCUSATE SODIUM 100 MG PO CAPS
100.0000 mg | ORAL_CAPSULE | Freq: Two times a day (BID) | ORAL | Status: DC
  Administered 2023-06-05 – 2023-06-06 (×2): 100 mg via ORAL
  Filled 2023-06-05 (×2): qty 1

## 2023-06-05 MED ORDER — LEVOTHYROXINE SODIUM 75 MCG PO TABS
175.0000 ug | ORAL_TABLET | Freq: Every day | ORAL | Status: DC
  Administered 2023-06-06: 175 ug via ORAL
  Filled 2023-06-05: qty 1

## 2023-06-05 MED ORDER — DIVALPROEX SODIUM 500 MG PO DR TAB: 750.0000 mg | DELAYED_RELEASE_TABLET | Freq: Two times a day (BID) | ORAL | Status: DC

## 2023-06-05 MED ORDER — ENOXAPARIN SODIUM 40 MG/0.4ML IJ SOSY
40.0000 mg | PREFILLED_SYRINGE | INTRAMUSCULAR | Status: DC
Start: 2023-06-05 — End: 2023-06-05

## 2023-06-05 MED ORDER — SODIUM CHLORIDE 0.9 % IV SOLN: INTRAVENOUS | Status: DC

## 2023-06-05 MED ORDER — ACETAMINOPHEN 650 MG RE SUPP: 650.0000 mg | Freq: Four times a day (QID) | RECTAL | Status: DC | PRN

## 2023-06-05 MED ORDER — ENOXAPARIN SODIUM 40 MG/0.4ML IJ SOSY: 40.0000 mg | PREFILLED_SYRINGE | INTRAMUSCULAR | Status: DC

## 2023-06-05 NOTE — ED Notes (Signed)
Patient transported to MRI 

## 2023-06-05 NOTE — H&P (Addendum)
History and Physical    Patient: JUANANTONIO STOLAR ZOX:[id number] DOB: 04-Jul-1956 DOA: 06/04/2023 DOS: the patient was seen and examined on 06/05/2023 PCP: Lucretia Field  Patient coming from:  Group home  Chief Complaint:  Chief Complaint  Patient presents with   Code Stroke   HPI: JAIMIN KRUPKA is a 67 y.o. male with medical history significant of developmental disability, reported schizophrenia, hypertension, GERD with associated Barrett's esophagitis, prior history of GI bleeding, hypertension, hypothyroidism, seizure disorder, prior CVA with left spastic hemiplegia, dyslipidemia.  Patient was sent by EMS t the group home to the ED for reports of slurred speech.  Code stroke was initiated at time of presentation.  Patient had no focal neurological deficits.  CT head without contrast code stroke protocol revealed no acute intracranial abnormality.  Patient has atrophy and chronic small vessel ischemic disease with chronic right cerebral encephalomalacia which is stable.  Dr. Amada Jupiter was the neurologist on-call.  Neurology noted that he was lethargic/encephalopathic but when awakened he would answer questions but without direct noxious stimulation he was very dysarthric but he cleared as he awoke.  He also documented that in the past his seizures have not significantly affected his mental status and postictal states are not characteristic for this patient.  Recommendation was to check a lamotrigine level continue home Depakote and lamotrigine doses.  It is noted the patient is on a very high dose of Seroquel 400 mg twice daily so decreasing that dose was recommended.  EEG and MRI recommended as well as ammonia and blood gas.  In the ED the patient remained afebrile, normotensive, heart rate mid 50s bradycardic up to the 60s, room air sats remained normal.  Patient appeared to be volume depleted noting creatinine up to 1.6 with a baseline creatinine between 1.24 and 1.13.  Ammonia  level was normal, troponin was normal, labs otherwise within normal limits.  No leukocytosis.  MRI was obtained and revealed no acute intracranial process.  There was also evidence of prior right frontal craniotomy.  ABG pending at time of dictation.  Of note urine drug screen positive for benzodiazepines.  Lamotrigine level was normal at 8.4 valproic acid level was slightly elevated at 112.  In review of medication reconciliation no benzodiazepines noted on his home medications. Pt's sister who is also POA conformed he was not on BZDs at group home. Recent increase in Seroquel with subsequent increased daytime sleepiness and decreased mobility. In OP PT prior to admit  During my evaluation of the patient he remained very lethargic but would attempt to engage in conversation but would drift back off to sleep.  Hospitalist service will admit this patient to evaluate for acute metabolic encephalopathy.   Review of Systems: As mentioned in the history of present illness. All other systems reviewed and are negative. Past Medical History:  Diagnosis Date   Barrett esophagus    DM II (diabetes mellitus, type II), controlled (HCC) 11/13/2020   Dysphagia    Gastroparesis    GERD (gastroesophageal reflux disease)    Hypercholesteremia    Hypertension    Iron deficiency anemia    Mental retardation    Schizophrenia (HCC)    Seizure (HCC)    Stroke [facility])    Past Surgical History:  Procedure Laterality Date   BIOPSY  10/21/2020   Procedure: BIOPSY;  Surgeon: Iva Boop, MD;  Location: [facility] ENDOSCOPY;  Service: Endoscopy;;   CRANIOTOMY FOR CYST FENESTRATION     ESOPHAGOGASTRODUODENOSCOPY N/A 01/09/2015  Procedure: ESOPHAGOGASTRODUODENOSCOPY (EGD);  Surgeon: Louis Meckel, MD;  Location: [facility] ENDOSCOPY;  Service: Endoscopy;  Laterality: N/A;   ESOPHAGOGASTRODUODENOSCOPY N/A 10/26/2020   Procedure: ESOPHAGOGASTRODUODENOSCOPY (EGD);  Surgeon: Corliss Skains, MD;  Location: [facility] OR;  Service:  Thoracic;  Laterality: N/A;   ESOPHAGOGASTRODUODENOSCOPY (EGD) WITH PROPOFOL N/A 10/21/2020   Procedure: ESOPHAGOGASTRODUODENOSCOPY (EGD) WITH PROPOFOL;  Surgeon: Iva Boop, MD;  Location: [facility] ENDOSCOPY;  Service: Endoscopy;  Laterality: N/A;   IR GASTR TUBE CONVERT GASTR-JEJ PER W/FL MOD SED  11/07/2020   PEG PLACEMENT N/A 10/26/2020   Procedure: PERCUTANEOUS ENDOSCOPIC GASTROSTOMY (PEG) PLACEMENT;  Surgeon: Corliss Skains, MD;  Location: [facility] OR;  Service: Thoracic;  Laterality: N/A;   XI ROBOTIC ASSISTED HIATAL HERNIA REPAIR N/A 10/26/2020   Procedure: XI ROBOTIC ASSISTED HIATAL HERNIA REPAIR;  Surgeon: Corliss Skains, MD;  Location: [facility] OR;  Service: Thoracic;  Laterality: N/A;   Social History:  reports that he has never smoked. He has never used smokeless tobacco. He reports that he does not drink alcohol and does not use drugs.  Allergies  Allergen Reactions   Vancomycin Itching and Other (See Comments)    Unknown-possible hives      Family History  Problem Relation Age of Onset   Breast cancer Mother    Heart disease Father     Prior to Admission medications   Medication Sig Start Date End Date Taking? Authorizing Provider  Acetylcysteine (NAC) 500 MG CAPS Take 1 capsule by mouth 2 (two) times daily.   Yes [provider]  Cholecalciferol (VITAMIN D) 50 MCG (2000 UT) tablet Take 1 tablet (2,000 Units total) by mouth daily. 12/06/20  Yes Angiulli, Mcarthur Rossetti, PA-C  divalproex (DEPAKOTE) 250 MG DR tablet Take 250 mg by mouth 2 (two) times daily.   Yes [provider]  divalproex (DEPAKOTE) 500 MG DR tablet Take 500 mg by mouth 2 (two) times daily. Take with the 250mg  tablet for a total dose of 750mg  twice a day   Yes [provider]  docusate sodium (COLACE) 100 MG capsule Take 1 capsule (100 mg total) by mouth 2 (two) times daily. 12/06/20  Yes Angiulli, Mcarthur Rossetti, PA-C  Emollient (CORN HUSKERS EX) Apply 1 application topically in the morning  and at bedtime. For dry skin   Yes [provider]  lamoTRIgine (LAMICTAL) 100 MG tablet Take 1 tablet (100 mg total) by mouth at bedtime. 04/24/23  Yes Levert Feinstein, MD  levothyroxine (SYNTHROID) 175 MCG tablet Take 175 mcg by mouth daily. 04/05/23  Yes [provider]  metoprolol tartrate (LOPRESSOR) 25 MG tablet Take 1 tablet (25 mg total) by mouth 2 (two) times daily. 07/10/22  Yes Raulkar, Drema Pry, MD  pantoprazole (PROTONIX) 40 MG tablet Take 1 tablet (40 mg total) by mouth 2 (two) times daily before a meal. 12/06/20  Yes Angiulli, Mcarthur Rossetti, PA-C  polyethylene glycol (MIRALAX / GLYCOLAX) 17 g packet Take 17 g by mouth 2 (two) times daily. 12/06/20  Yes Angiulli, Mcarthur Rossetti, PA-C  QUEtiapine (SEROQUEL) 400 MG tablet Take 1 tablet (400 mg total) by mouth 2 (two) times daily. 12/06/20  Yes Angiulli, Mcarthur Rossetti, PA-C  rosuvastatin (CRESTOR) 20 MG tablet Take 1 tablet (20 mg total) by mouth daily. 12/07/20  Yes Angiulli, Mcarthur Rossetti, PA-C  ondansetron (ZOFRAN) 4 MG tablet Take 4 mg by mouth 4 (four) times daily as needed for nausea or vomiting.    [provider]    Physical Exam: Vitals:  06/05/23 0630 06/05/23 0730 06/05/23 0738 06/05/23 0824  BP: 118/84 104/76  133/80  Pulse: (!) 54 63  60  Resp: (!) 22 (!) 21  18  Temp:   (!) 97.4 F (36.3 C)   TempSrc:   Axillary   SpO2: 97% 97%  100%  Weight:       Constitutional: NAD, calm, comfortable and remains sleepy and lethargic Respiratory: clear to auscultation bilaterally, no wheezing, no crackles. Normal respiratory effort. No accessory muscle use.  Cardiovascular: Regular rate and rhythm, no murmurs / rubs / gallops. No extremity edema. 2+ pedal pulses.  Abdomen: no tenderness, no masses palpated. No hepatosplenomegaly. Bowel sounds positive.  Musculoskeletal: no clubbing / cyanosis. No joint deformity upper and lower extremities. Good ROM, no contractures. Normal muscle tone.  Skin: no rashes, lesions, ulcers. No  induration Neurologic: CN 2-12 grossly intact based upon visual inspection and observation. Sensation intact, Strength unable to be adequately tested due to patient's inability to participate with exam.  Does have documented left spastic hemiplegia due to prior stroke and craniotomy Psychiatric: Awakens to voice and tactile stimulation.  Speech difficult to understand but does attempt to engage in her station before drifting back off to sleep.   Data Reviewed:  Per HPI  Assessment and Plan: Acute metabolic encephalopathy Likely drug-induced-imaging negative for acute stroke Patient on a significantly elevated dose of Seroquel 400 mg twice daily.  Will hold for now and await patient to awaken before reintroducing.  Likely at 200 mg twice daily will a appropriate dose Also was on a rather high dose of Depakote 750 mg twice daily-will decrease to 500 twice daily for now noting Depakote level slightly elevated at 112 Of note there were no benzodiazepines on the patient's home medication list which has been confirmed by pharmacy.  Urine drug screen was positive for benzodiazepines. benzodiazepines could certainly contribute to altered mentation especially in combination with his above chronic medications This is patient's third presentation with similar symptoms.  Initially presented in April and dc'd and was readmitted in June and discharged on 05/26/23 PaO2 was 69 on ABG- add 2L O2 to see if mentation improves  Acute kidney injury Baseline creatinine between 1.13 and 1.24 Presenting creatinine was 1.61 Certainly if patient has been lethargic for multiple days his oral intake especially fluids could have been decreased contributing to dehydration and AKI Hold any potentially nephrotoxic medications until renal function has improved  Developmental delay/? Schizophrenia Lives at group home Holding Seroquel for now  History of seizure disorder EEG this admission shows findings consistent with  metabolic encephalopathy Continue current dose of Lamictal but will decrease Depakote level to 500 twice daily given presentation with altered mentation and metabolic encephalopathy symptoms  Hypertension Resume home medications of Lopressor and monitor for symptomatic bradycardia  Dyslipidemia Continues Crestor  Hypothyroidism Continue Synthroid    Advance Care Planning:   Code Status: Full Code   VTE prophylaxis: Lovenox  Consults: Neurology  Family Communication: Patient only-patient has legal guardian according to chart  Severity of Illness: The appropriate patient status for this patient is OBSERVATION. Observation status is judged to be reasonable and necessary in order to provide the required intensity of service to ensure the patient's safety. The patient's presenting symptoms, physical exam findings, and initial radiographic and laboratory data in the context of their medical condition is felt to place them at decreased risk for further clinical deterioration. Furthermore, it is anticipated that the patient will be medically stable for discharge from the  hospital within 2 midnights of admission.   Author: Junious Silk, NP 06/05/2023 11:30 AM  For on call review [email or web address].

## 2023-06-05 NOTE — Progress Notes (Signed)
For PROVIDERS from sister reporting from group home  Depakote 500 mg BID  Until April 24th 2024 Then it was 700mg  BID  Seroquel since march of 2023- 400mg  BID

## 2023-06-05 NOTE — Progress Notes (Signed)
Neurology Progress Note  Brief HPI: 67 year old patient with history of seizures, schizophrenia, benign intracranial tumor status postcraniotomy in 2004, developmental delay, Barrett's esophagus, GERD, hypothyroidism, hypertension, hyperlipidemia and stroke with left-sided weakness since with recurrent altered mental status.  After taking his nighttime medications, which included Seroquel, Depakote and lamotrigine, he was found to be very confused and encephalopathic.  On exam on arrival, patient was obtunded and would respond to repeated noxious stimuli.  Of note, his urine drug screen was positive for benzodiazepines, although patient lives in a group home and has no means of getting these, and no benzodiazepines are on his home medication list.  Patient's sister is at bedside and states that patient has had a decline over the last few months with several admissions for altered mental status and decreased ability to ambulate.  She verbalizes concerns about positive urine drug screen result for benzodiazepines.  Subjective: Patient is awake and alert this morning and reports that he is doing well.  Exam: Vitals:   06/05/23 0738 06/05/23 0824  BP:  133/80  Pulse:  60  Resp:  18  Temp: (!) 97.4 F (36.3 C)   SpO2:  100%   Gen: In bed, NAD Resp: non-labored breathing, no acute distress  Neuro: Mental Status: Patient is alert, responds to name and follows simple and complex commands, disoriented to time and situation Cranial Nerves: Pupils equal round and reactive, extraocular movements intact, facial sensation symmetric, face symmetric, hearing intact to voice, shoulder shrug symmetric, tongue midline Motor: Able to move all 4 extremities with good antigravity strength, left upper extremity weaker than right Sensory: Intact to light touch throughout Cerebellar: Some ataxia in proportion to weakness noted in left upper extremity Gait: Deferred  Pertinent Labs: Drugs of Abuse     Component  Value Date/Time   LABOPIA NONE DETECTED 06/05/2023 0052   COCAINSCRNUR NONE DETECTED 06/05/2023 0052   LABBENZ POSITIVE (A) 06/05/2023 0052   AMPHETMU NONE DETECTED 06/05/2023 0052   THCU NONE DETECTED 06/05/2023 0052   LABBARB NONE DETECTED 06/05/2023 0052        Latest Ref Rng & Units 06/04/2023   10:34 PM 06/04/2023   10:25 PM 05/25/2023    2:27 PM  CBC  WBC 4.0 - 10.5 K/uL  4.0  3.5   Hemoglobin 13.0 - 17.0 g/dL 09.8  11.9  14.7   Hematocrit 39.0 - 52.0 % 44.0  43.7  45.2   Platelets 150 - 400 K/uL  161  178        Latest Ref Rng & Units 06/04/2023   10:34 PM 06/04/2023   10:25 PM 05/25/2023    2:27 PM  BMP  Glucose 70 - 99 mg/dL 829  562  81   BUN 8 - 23 mg/dL 13  12  11    Creatinine 0.61 - 1.24 mg/dL 1.30  8.65  7.84   Sodium 135 - 145 mmol/L 140  137  140   Potassium 3.5 - 5.1 mmol/L 4.1  4.2  4.0   Chloride 98 - 111 mmol/L 104  103  103   CO2 22 - 32 mmol/L  25  31   Calcium 8.9 - 10.3 mg/dL  8.8  9.1      Imaging Reviewed:  MRI brain: No acute abnormality  EEG: Negative for seizure activity  Assessment: 67 year old patient with the above past medical history presents with recurrent altered mental status.  He became obtunded after taking his evening medications at his group home.  Of note,  his urine drug screen is positive for benzodiazepines, although these are not on his home med list and patient was unable to obtain them on his own.  He does not appear to have been given any benzodiazepines by EMS.  On exam last night, he was obtunded and required repeated noxious stimuli and to engage in exam, but this morning he is awake, alert and conversant.  Patient's sister verbalizes concern about declining functional status over the last few months as well as presence of benzodiazepines on UDS.  Will send confirmatory tests on urine remaining in lab to determine exactly which benzodiazepines were present.  There was some concern that patient's Seroquel could have caused his altered  mental status, but his sister states that he has been on this dose of Seroquel for several years without problems.  Impression: Episode of altered mental status with benzodiazepines present on urine drug screen  Recommendations: 1) continue home Depakote and Lamictal 2) serum drug screen with confirmation 3) quantitative urine benzodiazepines, sent on sample collected on admission  Cortney E Ernestina Columbia , MSN, AGACNP-BC [facility] See Amion for schedule and pager information 06/05/2023 10:47 AM  ATTENDING ATTESTATION:  Back to baseline. F/u on drug screening.   Neurology will sign off.   Dr. Viviann Spare evaluated pt independently, reviewed imaging, chart, labs. Discussed and formulated plan with the Resident/APP. Changes were made to the note where appropriate. Please see APP/resident note above for details.   Jeovanny Cuadros,MD

## 2023-06-05 NOTE — Progress Notes (Signed)
EEG complete - results pending 

## 2023-06-05 NOTE — ED Notes (Signed)
Pt refusing to do most assessments stating, "I don't feel like it."

## 2023-06-05 NOTE — Progress Notes (Signed)
ABG ordered, and obtained, while patient resting comfortably on room air.  No further orders at this time.   Latest Reference Range & Units 06/05/23 12:14  Sample type  ARTERIAL  pH, Arterial 7.35 - 7.45  7.409  pCO2 arterial 32 - 48 mmHg 41.9  pO2, Arterial 83 - 108 mmHg 69 (L)  TCO2 22 - 32 mmol/L 28  Acid-Base Excess 0.0 - 2.0 mmol/L 2.0  Bicarbonate 20.0 - 28.0 mmol/L 26.6  O2 Saturation % 94  Patient temperature  97.8 F  Collection site  RADIAL, ALLEN'S TEST ACCEPTABLE

## 2023-06-05 NOTE — ED Notes (Signed)
Requested [place] to page hospitalist

## 2023-06-05 NOTE — Procedures (Signed)
Patient Name: [name]  MRN: [id number]  Epilepsy Attending: Charlsie Quest  Referring Physician/Provider: Rejeana Brock, MD  Date: 06/05/2023 Duration: 25.37 mins  Patient history:  67 year old male with abrupt worsening of mental status shortly after receiving medications. EEG to evaluate for seizure.  Level of alertness: Awake, asleep  AEDs during EEG study: None  Technical aspects: This EEG study was done with scalp electrodes positioned according to the 10-20 International system of electrode placement. Electrical activity was reviewed with band pass filter of 1-70Hz , sensitivity of 7 uV/mm, display speed of 60mm/sec with a 60Hz  notched filter applied as appropriate. EEG data were recorded continuously and digitally stored.  Video monitoring was available and reviewed as appropriate.  Description: The posterior dominant rhythm consists of 7 Hz activity of moderate voltage (25-35 uV) seen predominantly in posterior head regions, symmetric and reactive to eye opening and eye closing. Sleep was characterized by vertex waves, sleep spindles (12 to 14 Hz), maximal frontocentral region. EEG showed continuous generalized 5 to 6 Hz theta slowing. Hyperventilation and photic stimulation were not performed.     ABNORMALITY - Continuous slow, generalized - Background slow  IMPRESSION: This study is suggestive of moderate diffuse encephalopathy, nonspecific etiology. No seizures or epileptiform discharges were seen throughout the recording.  [place] Journey Annabelle Harman

## 2023-06-05 NOTE — ED Provider Notes (Incomplete)
  [place] EMERGENCY DEPARTMENT AT [facility] Provider Note   CSN: [id number] Arrival date & time: 06/04/23  2133     History No chief complaint on file.   HPI [name] is a 67 y.o. male presenting for chief complaint of altered mental status.  Patient coming from facility/group home, altered on arrival.  Last well at dinner.  Activated a code stroke for slurred speech.   Patient's recorded medical, surgical, social, medication list and allergies were reviewed in the Snapshot window as part of the initial history.   Review of Systems   Review of Systems  Constitutional:  Negative for chills and fever.  HENT:  Negative for ear pain and sore throat.   Eyes:  Negative for pain and visual disturbance.  Respiratory:  Negative for cough and shortness of breath.   Cardiovascular:  Negative for chest pain and palpitations.  Gastrointestinal:  Negative for abdominal pain and vomiting.  Genitourinary:  Negative for dysuria and hematuria.  Musculoskeletal:  Negative for arthralgias and back pain.  Skin:  Negative for color change and rash.  Neurological:  Negative for seizures and syncope.  All other systems reviewed and are negative.   Physical Exam Updated Vital Signs Wt 66.2 kg   BMI 25.85 kg/m  Physical Exam   ED Course/ Medical Decision Making/ A&P    Procedures Procedures   Medications Ordered in ED Medications - No data to display  Medical Decision Making:    [name] is a 67 y.o. male who presented to the ED today with *** detailed above.     {crccomplexity:27900}  Complete initial physical exam performed, notably the patient  was ***.      Reviewed and confirmed nursing documentation for past medical history, family history, social history.    Initial Assessment:   With the patient's presentation of ***, most likely diagnosis is ***. Other diagnoses were considered including (but not limited to) ***. These are considered  less likely due to history of present illness and physical exam findings.   {crccopa:27899}  Initial Plan:  ***  ***Screening labs including CBC and Metabolic panel to evaluate for infectious or metabolic etiology of disease.  ***Urinalysis with reflex culture ordered to evaluate for UTI or relevant urologic/nephrologic pathology.  ***CXR to evaluate for structural/infectious intrathoracic pathology.  ***EKG to evaluate for cardiac pathology. Objective evaluation as below reviewed with plan for close reassessment  Initial Study Results:   Laboratory  All laboratory results reviewed without evidence of clinically relevant pathology.   ***Exceptions include: ***   ***EKG EKG was reviewed independently. Rate, rhythm, axis, intervals all examined and without medically relevant abnormality. ST segments without concerns for elevations.    Radiology  All images reviewed independently. ***Agree with radiology report at this time.   No results found.   Consults:  Case discussed with ***.   Final Assessment and Plan:   ***   ***  Clinical Impression: No diagnosis found.   Data Unavailable   Final Clinical Impression(s) / ED Diagnoses Final diagnoses:  None    Rx / DC Orders ED Discharge Orders     None

## 2023-06-05 NOTE — ED Provider Notes (Signed)
This patient arrived as a code stroke.  He had sudden onset altered mental status and decreased responsiveness while at group home.  Noncontrasted CT of head was negative.  Currently awaiting neurology recommendations. Physical Exam  BP 103/68   Pulse 63   Temp (!) 96.3 F (35.7 C) (Tympanic) Comment: Warm blankets applied  Resp (!) 23   Wt 66.2 kg   SpO2 98%   BMI 25.85 kg/m   Physical Exam Vitals and nursing note reviewed.  Constitutional:      General: He is not in acute distress.    Appearance: He is well-developed. He is not toxic-appearing or diaphoretic.  HENT:     Head: Normocephalic and atraumatic.     Right Ear: External ear normal.     Left Ear: External ear normal.     Nose: Nose normal.     Mouth/Throat:     Mouth: Mucous membranes are moist.  Eyes:     Extraocular Movements: Extraocular movements intact.     Conjunctiva/sclera: Conjunctivae normal.  Cardiovascular:     Rate and Rhythm: Normal rate and regular rhythm.  Pulmonary:     Effort: Pulmonary effort is normal. No respiratory distress.  Abdominal:     General: There is no distension.     Palpations: Abdomen is soft.     Tenderness: There is no abdominal tenderness.  Musculoskeletal:        General: No swelling or deformity.     Cervical back: Normal range of motion and neck supple.     Right lower leg: No edema.     Left lower leg: No edema.  Skin:    General: Skin is warm and dry.     Coloration: Skin is not jaundiced or pale.  Neurological:     Mental Status: He is alert.     GCS: GCS eye subscore is 1. GCS verbal subscore is 4. GCS motor subscore is 6.     Cranial Nerves: No facial asymmetry.  Psychiatric:        Mood and Affect: Mood normal.     Procedures  Procedures  ED Course / MDM    Medical Decision Making Amount and/or Complexity of Data Reviewed Labs: ordered. Radiology: ordered.   Assessment, patient is sleeping.  The staff member from his group home is present at  bedside.  Patient is arousable to voice and physical stimuli.  He remains very somnolent.  He does attempt to speak but speech is not intelligible.  Group home staff member reports that he is normally conversant with clear speech.  Plan will be for admission to medicine.  MRI was ordered.  Neurology will order EEG.       Gloris Manchester, MD 06/05/23 [id number]

## 2023-06-05 NOTE — ED Notes (Signed)
ED TO INPATIENT HANDOFF REPORT  ED Nurse Name and Phone #: Marcelino Duster [phone]  S Name/Age/Gender [name] 67 y.o. male Room/Bed: 037C/037C  Code Status   Code Status: Full Code  Home/SNF/Other Skilled nursing facility Patient oriented to: self and place Is this baseline? Yes   Triage Complete: Triage complete  Chief Complaint Stroke-like symptoms [R29.90] Acute metabolic encephalopathy [G93.41]  Triage Note Pt BIB [facility] from group home as code stroke.  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   LKW 1930  [facility] VS: 108/62, HR: 67, 92% RA, cbg 144. Ems not able to obtain iv access.    Allergies Allergies  Allergen Reactions   Vancomycin Itching and Other (See Comments)    Unknown-possible hives      Level of Care/Admitting Diagnosis ED Disposition     ED Disposition  Admit   Condition  --   Comment  Hospital Area: [facility] [[id number]]  Level of Care: Med-Surg [16]  May place patient in observation at [facility] or [facility] if equivalent level of care is available:: Yes  Covid Evaluation: Asymptomatic - no recent exposure (last 10 days) testing not required  Diagnosis: Acute metabolic encephalopathy [4540981]  Admitting Physician: Jerald Kief [phone]  Attending Physician: Jerald Kief [[id number]]          B Medical/Surgery History Past Medical History:  Diagnosis Date   Barrett esophagus    DM II (diabetes mellitus, type II), controlled (HCC) 11/13/2020   Dysphagia    Gastroparesis    GERD (gastroesophageal reflux disease)    Hypercholesteremia    Hypertension    Iron deficiency anemia    Mental retardation    Schizophrenia (HCC)    Seizure (HCC)    Stroke [facility])    Past Surgical History:  Procedure Laterality Date   BIOPSY  10/21/2020   Procedure: BIOPSY;  Surgeon: Iva Boop, MD;  Location: [facility] ENDOSCOPY;  Service: Endoscopy;;   CRANIOTOMY FOR CYST FENESTRATION     ESOPHAGOGASTRODUODENOSCOPY N/A  01/09/2015   Procedure: ESOPHAGOGASTRODUODENOSCOPY (EGD);  Surgeon: Louis Meckel, MD;  Location: [facility] ENDOSCOPY;  Service: Endoscopy;  Laterality: N/A;   ESOPHAGOGASTRODUODENOSCOPY N/A 10/26/2020   Procedure: ESOPHAGOGASTRODUODENOSCOPY (EGD);  Surgeon: Corliss Skains, MD;  Location: [facility] OR;  Service: Thoracic;  Laterality: N/A;   ESOPHAGOGASTRODUODENOSCOPY (EGD) WITH PROPOFOL N/A 10/21/2020   Procedure: ESOPHAGOGASTRODUODENOSCOPY (EGD) WITH PROPOFOL;  Surgeon: Iva Boop, MD;  Location: [facility] ENDOSCOPY;  Service: Endoscopy;  Laterality: N/A;   IR GASTR TUBE CONVERT GASTR-JEJ PER W/FL MOD SED  11/07/2020   PEG PLACEMENT N/A 10/26/2020   Procedure: PERCUTANEOUS ENDOSCOPIC GASTROSTOMY (PEG) PLACEMENT;  Surgeon: Corliss Skains, MD;  Location: [facility] OR;  Service: Thoracic;  Laterality: N/A;   XI ROBOTIC ASSISTED HIATAL HERNIA REPAIR N/A 10/26/2020   Procedure: XI ROBOTIC ASSISTED HIATAL HERNIA REPAIR;  Surgeon: Corliss Skains, MD;  Location: [facility] OR;  Service: Thoracic;  Laterality: N/A;     A IV Location/Drains/Wounds Patient Lines/Drains/Airways Status     Active Line/Drains/Airways     Name Placement date Placement time Site Days   Peripheral IV 06/04/23 20 G 1.25" Left Antecubital 06/04/23  2227  Antecubital  1   Gastrostomy/Enterostomy PEG-jejunostomy;Other (Comment) 24 Fr. Other (comment) 11/07/20  1559  Other (comment)  940   External Urinary Catheter 06/05/23  0430  --  less than 1            Intake/Output Last 24 hours  Intake/Output Summary (Last 24 hours) at 06/05/2023 1326 Last data filed at 06/05/2023 1914 Gross per 24 hour  Intake 1382.68 ml  Output --  Net 1382.68 ml    Labs/Imaging Results for orders placed or performed during the hospital encounter of 06/04/23 (from the past 48 hour(s))  Protime-INR     Status: None   Collection Time: 06/04/23 10:25 PM  Result Value Ref Range   Prothrombin Time 14.2 11.4 - 15.2 seconds   INR 1.1 0.8 - 1.2     Comment: (NOTE) INR goal varies based on device and disease states. Performed at [facility], [address], [place], [place] [address]   APTT     Status: None   Collection Time: 06/04/23 10:25 PM  Result Value Ref Range   aPTT 28 24 - 36 seconds    Comment: Performed at [facility], [address] [place], [place] [address]  CBC     Status: None   Collection Time: 06/04/23 10:25 PM  Result Value Ref Range   WBC 4.0 4.0 - 10.5 K/uL   RBC 4.42 4.22 - 5.81 MIL/uL   Hemoglobin 14.5 13.0 - 17.0 g/dL   HCT 86.5 78.4 - 69.6 %   MCV 98.9 80.0 - 100.0 fL   MCH 32.8 26.0 - 34.0 pg   MCHC 33.2 30.0 - 36.0 g/dL   RDW 29.5 28.4 - 13.2 %   Platelets 161 150 - 400 K/uL   nRBC 0.0 0.0 - 0.2 %    Comment: Performed at [facility], [address], [place], [place] [address]  Differential     Status: None   Collection Time: 06/04/23 10:25 PM  Result Value Ref Range   Neutrophils Relative % 42 %   Neutro Abs 1.7 1.7 - 7.7 K/uL   Lymphocytes Relative 44 %   Lymphs Abs 1.8 0.7 - 4.0 K/uL   Monocytes Relative 10 %   Monocytes Absolute 0.4 0.1 - 1.0 K/uL   Eosinophils Relative 2 %   Eosinophils Absolute 0.1 0.0 - 0.5 K/uL   Basophils Relative 1 %   Basophils Absolute 0.0 0.0 - 0.1 K/uL   Immature Granulocytes 1 %   Abs Immature Granulocytes 0.02 0.00 - 0.07 K/uL    Comment: Performed at [facility], [address], [place], [place] [address]  Comprehensive metabolic panel     Status: Abnormal   Collection Time: 06/04/23 10:25 PM  Result Value Ref Range   Sodium 137 135 - 145 mmol/L   Potassium 4.2 3.5 - 5.1 mmol/L   Chloride 103 98 - 111 mmol/L   CO2 25 22 - 32 mmol/L   Glucose, Bld 111 (H) 70 - 99 mg/dL    Comment: Glucose reference range applies only to samples taken after fasting for at least 8 hours.   BUN 12 8 - 23 mg/dL   Creatinine, Ser 6.64 (H) 0.61 - 1.24 mg/dL   Calcium 8.8 (L) 8.9 - 10.3 mg/dL   Total Protein 6.3 (L) 6.5 - 8.1 g/dL   Albumin 3.5  3.5 - 5.0 g/dL   AST 21 15 - 41 U/L   ALT 13 0 - 44 U/L   Alkaline Phosphatase 62 38 - 126 U/L   Total Bilirubin 0.3 0.3 - 1.2 mg/dL   GFR, Estimated 47 (L) >60 mL/min    Comment: (NOTE) Calculated using the CKD-EPI Creatinine Equation (2021)    Anion gap 9 5 - 15  Comment: Performed at [facility], [address] [place], [place] [address]  Ethanol     Status: None   Collection Time: 06/04/23 10:25 PM  Result Value Ref Range   Alcohol, Ethyl (B) <10 <10 mg/dL    Comment: (NOTE) Lowest detectable limit for serum alcohol is 10 mg/dL.  For medical purposes only. Performed at [facility], [address], [place], [place] [address]   Valproic acid level     Status: Abnormal   Collection Time: 06/04/23 10:25 PM  Result Value Ref Range   Valproic Acid Lvl 49 (L) 50.0 - 100.0 ug/mL    Comment: Performed at [facility], [address] [place], [place] [address]  I-stat chem 8, ED     Status: Abnormal   Collection Time: 06/04/23 10:34 PM  Result Value Ref Range   Sodium 140 135 - 145 mmol/L   Potassium 4.1 3.5 - 5.1 mmol/L   Chloride 104 98 - 111 mmol/L   BUN 13 8 - 23 mg/dL   Creatinine, Ser 2.13 (H) 0.61 - 1.24 mg/dL   Glucose, Bld 086 (H) 70 - 99 mg/dL    Comment: Glucose reference range applies only to samples taken after fasting for at least 8 hours.   Calcium, Ion 1.12 (L) 1.15 - 1.40 mmol/L   TCO2 28 22 - 32 mmol/L   Hemoglobin 15.0 13.0 - 17.0 g/dL   HCT 57.8 46.9 - 62.9 %  Urine rapid drug screen (hosp performed)     Status: Abnormal   Collection Time: 06/05/23 12:52 AM  Result Value Ref Range   Opiates NONE DETECTED NONE DETECTED   Cocaine NONE DETECTED NONE DETECTED   Benzodiazepines POSITIVE (A) NONE DETECTED   Amphetamines NONE DETECTED NONE DETECTED   Tetrahydrocannabinol NONE DETECTED NONE DETECTED   Barbiturates NONE DETECTED NONE DETECTED    Comment: (NOTE) DRUG SCREEN FOR MEDICAL PURPOSES ONLY.  IF CONFIRMATION IS NEEDED FOR ANY  PURPOSE, NOTIFY LAB WITHIN 5 DAYS.  LOWEST DETECTABLE LIMITS FOR URINE DRUG SCREEN Drug Class                     Cutoff (ng/mL) Amphetamine and metabolites    1000 Barbiturate and metabolites    200 Benzodiazepine                 200 Opiates and metabolites        300 Cocaine and metabolites        300 THC                            50 Performed at [facility], [address] [place], [place] [address]   Urinalysis, Routine w reflex microscopic -Urine, Clean Catch     Status: None   Collection Time: 06/05/23 12:52 AM  Result Value Ref Range   Color, Urine YELLOW YELLOW   APPearance CLEAR CLEAR   Specific Gravity, Urine 1.008 1.005 - 1.030   pH 7.0 5.0 - 8.0   Glucose, UA NEGATIVE NEGATIVE mg/dL   Hgb urine dipstick NEGATIVE NEGATIVE   Bilirubin Urine NEGATIVE NEGATIVE   Ketones, ur NEGATIVE NEGATIVE mg/dL   Protein, ur NEGATIVE NEGATIVE mg/dL   Nitrite NEGATIVE NEGATIVE   Leukocytes,Ua NEGATIVE NEGATIVE    Comment: Performed at [facility], [address] [place], [place] [address]  Troponin I (High Sensitivity)     Status: None  Collection Time: 06/05/23  1:48 AM  Result Value Ref Range   Troponin I (High Sensitivity) 3 <18 ng/L    Comment: (NOTE) Elevated high sensitivity troponin I (hsTnI) values and significant  changes across serial measurements may suggest ACS but many other  chronic and acute conditions are known to elevate hsTnI results.  Refer to the "Links" section for chest pain algorithms and additional  guidance. Performed at [facility], [address], [place], [place] [address]   Ammonia     Status: None   Collection Time: 06/05/23  1:48 AM  Result Value Ref Range   Ammonia 21 9 - 35 umol/L    Comment: Performed at [facility], [address] [place], [place] [address]  Troponin I (High Sensitivity)     Status: None   Collection Time: 06/05/23  3:39 AM  Result Value Ref Range   Troponin I (High Sensitivity) 4 <18 ng/L     Comment: (NOTE) Elevated high sensitivity troponin I (hsTnI) values and significant  changes across serial measurements may suggest ACS but many other  chronic and acute conditions are known to elevate hsTnI results.  Refer to the "Links" section for chest pain algorithms and additional  guidance. Performed at [facility], [address] [place], [place] [address]   TSH     Status: Abnormal   Collection Time: 06/05/23  3:39 AM  Result Value Ref Range   TSH 5.305 (H) 0.350 - 4.500 uIU/mL    Comment: Performed by a 3rd Generation assay with a functional sensitivity of <=0.01 uIU/mL. Performed at [facility], [address], [place], [place] [address]   I-Stat arterial blood gas, ED     Status: Abnormal   Collection Time: 06/05/23 12:14 PM  Result Value Ref Range   pH, Arterial 7.409 7.35 - 7.45   pCO2 arterial 41.9 32 - 48 mmHg   pO2, Arterial 69 (L) 83 - 108 mmHg   Bicarbonate 26.6 20.0 - 28.0 mmol/L   TCO2 28 22 - 32 mmol/L   O2 Saturation 94 %   Acid-Base Excess 2.0 0.0 - 2.0 mmol/L   Sodium 141 135 - 145 mmol/L   Potassium 4.1 3.5 - 5.1 mmol/L   Calcium, Ion 1.24 1.15 - 1.40 mmol/L   HCT 40.0 39.0 - 52.0 %   Hemoglobin 13.6 13.0 - 17.0 g/dL   Patient temperature 29.5 F    Collection site RADIAL, ALLEN'S TEST ACCEPTABLE    Drawn by RT    Sample type ARTERIAL    EEG adult  Result Date: 06/05/2023 Charlsie Quest, MD     06/05/2023  9:07 AM Patient Name: HILL UNDERDAHL MRN: [id number] Epilepsy Attending: Charlsie Quest Referring Physician/Provider: Rejeana Brock, MD Date: 06/05/2023 Duration: 25.37 mins Patient history:  67 year old male with abrupt worsening of mental status shortly after receiving medications. EEG to evaluate for seizure. Level of alertness: Awake, asleep AEDs during EEG study: None Technical aspects: This EEG study was done with scalp electrodes positioned according to the 10-20 International system of electrode placement.  Electrical activity was reviewed with band pass filter of 1-70Hz , sensitivity of 7 uV/mm, display speed of 43mm/sec with a 60Hz  notched filter applied as appropriate. EEG data were recorded continuously and digitally stored.  Video monitoring was available and reviewed as appropriate. Description: The posterior dominant rhythm consists of 7 Hz activity of moderate voltage (25-35 uV) seen predominantly in posterior head regions, symmetric and  reactive to eye opening and eye closing. Sleep was characterized by vertex waves, sleep spindles (12 to 14 Hz), maximal frontocentral region. EEG showed continuous generalized 5 to 6 Hz theta slowing. Hyperventilation and photic stimulation were not performed.   ABNORMALITY - Continuous slow, generalized - Background slow IMPRESSION: This study is suggestive of moderate diffuse encephalopathy, nonspecific etiology. No seizures or epileptiform discharges were seen throughout the recording. Charlsie Quest   MR BRAIN WO CONTRAST  Result Date: 06/05/2023 CLINICAL DATA:  Mental status change, unknown cause EXAM: MRI HEAD WITHOUT CONTRAST TECHNIQUE: Multiplanar, multiecho pulse sequences of the brain and surrounding structures were obtained without intravenous contrast. COMPARISON:  CT Head 06/04/23, MR  head 04/10/23 FINDINGS: Brain: Negative for an acute infarct. No hemorrhage. No hydrocephalus. No extra-axial fluid collection. There is ex vacuo dilatation of the right frontal horn. Redemonstrated large region of encephalomalacia in the right frontal lobe and opercular region. There is sequela of mild to moderate overall chronic microvascular ischemic change. Vascular: Normal flow voids. Skull and upper cervical spine: Right frontal craniotomy. Otherwise unremarkable. Sinuses/Orbits: No middle ear or mastoid effusion. Paranasal sinuses are clear. 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 Orbits are otherwise unremarkable. Other: None. IMPRESSION: No acute intracranial process.  Electronically Signed   By: Lorenza Cambridge M.D.   On: 06/05/2023 08:17   CT HEAD CODE STROKE WO CONTRAST  Result Date: 06/04/2023 CLINICAL DATA:  Code stroke. EXAM: CT HEAD WITHOUT CONTRAST TECHNIQUE: Contiguous axial images were obtained from the base of the skull through the vertex without intravenous contrast. RADIATION DOSE REDUCTION: This exam was performed according to the departmental dose-optimization program which includes automated exposure control, adjustment of the mA and/or kV according to patient size and/or use of iterative reconstruction technique. COMPARISON:  Prior study from 05/25/2023. FINDINGS: Brain: Postoperative changes from prior craniotomy noted. Extensive encephalomalacia involving the right frontal region, stable. Underlying atrophy with chronic small vessel ischemic disease. No acute intracranial hemorrhage. No acute large vessel territory infarct. No mass lesion or midline shift. No hydrocephalus or extra-axial fluid collection. Vascular: No abnormal hyperdense vessel. Scattered vascular calcifications noted within the carotid siphons. Skull: Scalp soft tissues and calvarium demonstrate no acute finding. Sinuses/Orbits: Globes orbital soft tissues within normal limits. Paranasal sinuses and mastoid air cells are clear. Other: None. ASPECTS [facility] Stroke Program Early CT Score) - Ganglionic level infarction (caudate, lentiform nuclei, internal capsule, insula, M1-M3 cortex): 7 - Supraganglionic infarction (M4-M6 cortex): 3 Total score (0-10 with 10 being normal): 10 IMPRESSION: 1. No acute intracranial abnormality. 2. ASPECTS is 10. 3. Atrophy with chronic small vessel ischemic disease with chronic right cerebral encephalomalacia, stable. These results were communicated to Dr. Amada Jupiter at 9:50 pm on 06/04/2023 by text page via the [facility] messaging system. Electronically Signed   By: Rise Mu M.D.   On: 06/04/2023 21:51    Pending Labs Unresulted Labs (From admission,  onward)     Start     Ordered   06/06/23 0500  Basic metabolic panel  Tomorrow morning,   R       Question:  Specimen collection method  Answer:  IV Team=IV Team collect   06/05/23 0746   06/05/23 1140  Blood gas, arterial  Once,   R        06/05/23 1139   06/05/23 1044  Miscellaneous [facility] test (send-out)  Add-on,   AD       Question:  Test name / description:  Answer:  [id number] Benzodiazepines, Urine, Quantitative   06/05/23 1046  06/05/23 1040  Drug Screen 10 W/Conf, Serum  Once,   R       Question:  Specimen collection method  Answer:  IV Team=IV Team collect   06/05/23 1046   06/05/23 0206  Lamotrigine level  Once,   URGENT       Question:  Specimen collection method  Answer:  IV Team=IV Team collect   06/05/23 0205            Vitals/Pain Today's Vitals   06/05/23 0824 06/05/23 1100 06/05/23 1149 06/05/23 1200  BP: 133/80 131/80 136/84 133/81  Pulse: 60 (!) 58 74 64  Resp: 18  18   Temp:   97.8 F (36.6 C)   TempSrc:   Oral   SpO2: 100% 98% 100% 97%  Weight:      PainSc:        Isolation Precautions No active isolations  Medications Medications  0.9 %  sodium chloride infusion ( Intravenous Restarted 06/05/23 0826)  sodium chloride flush (NS) 0.9 % injection 3 mL (0 mLs Intravenous Hold 06/05/23 0905)  acetaminophen (TYLENOL) tablet 650 mg (has no administration in time range)    Or  acetaminophen (TYLENOL) suppository 650 mg (has no administration in time range)  ondansetron (ZOFRAN) tablet 4 mg (has no administration in time range)    Or  ondansetron (ZOFRAN) injection 4 mg (has no administration in time range)  metoprolol tartrate (LOPRESSOR) tablet 12.5 mg (has no administration in time range)  rosuvastatin (CRESTOR) tablet 20 mg (has no administration in time range)  levothyroxine (SYNTHROID) tablet 175 mcg (has no administration in time range)  docusate sodium (COLACE) capsule 100 mg (has no administration in time range)  pantoprazole (PROTONIX) EC  tablet 40 mg (has no administration in time range)  lamoTRIgine (LAMICTAL) tablet 100 mg (has no administration in time range)  divalproex (DEPAKOTE) DR tablet 500 mg (has no administration in time range)  lactated ringers bolus 500 mL (0 mLs Intravenous Stopped 06/05/23 0241)  lactated ringers bolus 500 mL (0 mLs Intravenous Stopped 06/05/23 0453)    Mobility walks with person assist     Focused Assessments Cardiac Assessment Handoff:  Cardiac Rhythm: Sinus bradycardia Lab Results  Component Value Date   CKTOTAL 122 10/08/2007   CKMB 1.7 10/08/2007   TROPONINI <0.03 01/09/2015   Lab Results  Component Value Date   DDIMER 4.63 (H) 11/13/2020   Does the Patient currently have chest pain? No   , Neuro Assessment Handoff:  Swallow screen pass?  Pt has dysphagia diet Cardiac Rhythm: Sinus bradycardia NIH Stroke Scale  Dizziness Present: No Headache Present: No Interval: Initial (Patient unable to participate due to previous CVA, Craniotomy, IDD & drowseiness) Level of Consciousness (1a.)   : Alert, keenly responsive LOC Questions (1b. )   : Answers both questions correctly LOC Commands (1c. )   : Performs both tasks correctly Best Gaze (2. )  : Normal Visual (3. )  : No visual loss Facial Palsy (4. )    : Normal symmetrical movements Motor Arm, Left (5a. )   : Drift Motor Arm, Right (5b. ) : No drift Motor Leg, Left (6a. )  : No drift Motor Leg, Right (6b. ) : No drift Limb Ataxia (7. ): Amputation or joint fusion Sensory (8. )  : Normal, no sensory loss Best Language (9. )  : Mild-to-moderate aphasia (baseline) Dysarthria (10. ): Normal Extinction/Inattention (11.)   : No Abnormality Complete NIHSS TOTAL: 4 Last date known well:  06/04/23 Last time known well: 1930 Neuro Assessment: Exceptions to WDL Neuro Checks:   Initial (Patient unable to participate due to previous CVA, Craniotomy, IDD & drowseiness) (06/04/23 2135)  Has TPA been given? No If patient is a Neuro  Trauma and patient is going to OR before floor call report to 4N Charge nurse: [phone] or [phone]   R Recommendations: See Admitting Provider Note  Report given to:   Additional Notes:   \

## 2023-06-06 ENCOUNTER — Ambulatory Visit: Payer: Medicare Other | Admitting: Occupational Therapy

## 2023-06-06 ENCOUNTER — Telehealth (HOSPITAL_COMMUNITY): Payer: Self-pay

## 2023-06-06 ENCOUNTER — Encounter (HOSPITAL_COMMUNITY): Payer: Self-pay | Admitting: Internal Medicine

## 2023-06-06 ENCOUNTER — Ambulatory Visit: Payer: Medicare Other | Admitting: Physical Therapy

## 2023-06-06 ENCOUNTER — Other Ambulatory Visit (HOSPITAL_COMMUNITY): Payer: Self-pay

## 2023-06-06 DIAGNOSIS — R2990 Unspecified symptoms and signs involving the nervous system: Secondary | ICD-10-CM | POA: Diagnosis not present

## 2023-06-06 DIAGNOSIS — R4182 Altered mental status, unspecified: Secondary | ICD-10-CM | POA: Diagnosis not present

## 2023-06-06 DIAGNOSIS — R4781 Slurred speech: Secondary | ICD-10-CM | POA: Diagnosis not present

## 2023-06-06 DIAGNOSIS — R29818 Other symptoms and signs involving the nervous system: Secondary | ICD-10-CM | POA: Diagnosis not present

## 2023-06-06 LAB — BASIC METABOLIC PANEL
Anion gap: 8 (ref 5–15)
BUN: 7 mg/dL — ABNORMAL LOW (ref 8–23)
CO2: 25 mmol/L (ref 22–32)
Calcium: 8.3 mg/dL — ABNORMAL LOW (ref 8.9–10.3)
Chloride: 102 mmol/L (ref 98–111)
Creatinine, Ser: 1.07 mg/dL (ref 0.61–1.24)
GFR, Estimated: >60 mL/min (ref >60)
Glucose, Bld: 104 mg/dL — ABNORMAL HIGH (ref 70–99)
Potassium: 4 mmol/L (ref 3.5–5.1)
Sodium: 135 mmol/L (ref 135–145)

## 2023-06-06 LAB — MISC LABCORP TEST (SEND OUT): Labcorp test code: 701865

## 2023-06-06 LAB — LAMOTRIGINE LEVEL: Lamotrigine Lvl: 8.1 ug/mL (ref 2.0–20.0)

## 2023-06-06 MED ORDER — METOPROLOL TARTRATE 25 MG PO TABS
12.5000 mg | ORAL_TABLET | Freq: Two times a day (BID) | ORAL | 0 refills | Status: DC
  Filled 2023-06-06: qty 30, 30d supply, fill #0

## 2023-06-06 MED ORDER — DIVALPROEX SODIUM 500 MG PO DR TAB
500.0000 mg | DELAYED_RELEASE_TABLET | Freq: Two times a day (BID) | ORAL | 0 refills | Status: DC
  Filled 2023-06-06: qty 60, 30d supply, fill #0

## 2023-06-06 MED ORDER — QUETIAPINE FUMARATE 200 MG PO TABS
200.0000 mg | ORAL_TABLET | Freq: Two times a day (BID) | ORAL | 0 refills | Status: DC
  Filled 2023-06-06: qty 60, 30d supply, fill #0

## 2023-06-06 NOTE — Discharge Summary (Addendum)
Physician Discharge Summary   Patient: KAP MILTIMORE MRN: [id number] DOB: Aug 30, 1956  Admit date:     06/04/2023  Discharge date: 06/06/23  Discharge Physician: Rickey Barbara   PCP: Lucretia Field   Recommendations at discharge:    Follow up with PCP In 1-2 weeks Follow up with Neurology, Dr. Terrace Arabia as scheduled Please follow up on serum confirmatory drug screen that was obtained 6/12 which should result by 6/17  Discharge Diagnoses: Principal Problem:   Stroke-like symptoms Active Problems:   Acute metabolic encephalopathy  Resolved Problems:   * No resolved hospital problems. *  Hospital Course: 67 y.o. male with medical history significant of developmental disability, reported schizophrenia, hypertension, GERD with associated Barrett's esophagitis, prior history of GI bleeding, hypertension, hypothyroidism, seizure disorder, prior CVA with left spastic hemiplegia, dyslipidemia.  Patient was sent by EMS t the group home to the ED for reports of slurred speech.  Code stroke was initiated at time of presentation.  Patient had no focal neurological deficits.  CT head without contrast code stroke protocol revealed no acute intracranial abnormality.  Patient has atrophy and chronic small vessel ischemic disease with chronic right cerebral encephalomalacia which is stable.  Dr. Amada Jupiter was the neurologist on-call.  Neurology noted that he was lethargic/encephalopathic but when awakened he would answer questions but without direct noxious stimulation he was very dysarthric but he cleared as he awoke.  He also documented that in the past his seizures have not significantly affected his mental status and postictal states are not characteristic for this patient.  Recommendation was to check a lamotrigine level continue home Depakote and lamotrigine doses.  It is noted the patient is on a very high dose of Seroquel 400 mg twice daily so decreasing that dose was recommended.  EEG and MRI  recommended as well as ammonia and blood gas.   In the ED the patient remained afebrile, normotensive, heart rate mid 50s bradycardic up to the 60s, room air sats remained normal.  Patient appeared to be volume depleted noting creatinine up to 1.6 with a baseline creatinine between 1.24 and 1.13.  Ammonia level was normal, troponin was normal, labs otherwise within normal limits.  No leukocytosis.  MRI was obtained and revealed no acute intracranial process.  There was also evidence of prior right frontal craniotomy.  ABG pending at time of dictation.  Of note urine drug screen positive for benzodiazepines.  Lamotrigine level was normal at 8.4 valproic acid level was slightly elevated at 112  Assessment and Plan: 1) acute metabolic encephalopathy -level of alertness improved to baseline shortly after admit -Neurology was consulted. Discussed with Dr. Viviann Spare who recommended depakote 500mg  bid and continue lamictal 100mg  daily -Cont to avoid benzos. Of note, pt tested pos for benzo despite not being prescribed previously. Confirmatory serum test remains pending, would ask either PCP or Neurology to follow up on this -Seroquel was held this visit. Continue home dose 400mg  bid    2)ARF -Cont to encourage PO -Renal function normalized with IVF overnight   3)Developmental delay -Seems stable at this time   4)Hx seizure -EEG reviewed, no evidence of seizure -depakote decreased to 500mg  bid per above. Continue lamictal per above -neurology had been following -Recommend close follow up with primary Neurologist, Dr. Terrace Arabia after d/c given medication changes. Will also notify Dr. Terrace Arabia of pt's hospital visit through EMR   5)HTN -BP stable and controlled -cont current regimen   6)HLD -Cont statin   7)hypothyroid -cont home thyroid replacement  Consultants: Neurology Procedures performed:   Disposition: Home Diet recommendation:  Regular diet DISCHARGE MEDICATION: Allergies as of  06/06/2023       Reactions   Vancomycin Itching, Other (See Comments)   Unknown-possible hives        Medication List     STOP taking these medications    ondansetron 4 MG tablet Commonly known as: ZOFRAN       TAKE these medications    CORN HUSKERS EX Apply 1 application topically in the morning and at bedtime. For dry skin   divalproex 500 MG DR tablet Commonly known as: DEPAKOTE Take 1 tablet (500 mg total) by mouth every 12 (twelve) hours. What changed:  medication strength how much to take when to take this Another medication with the same name was removed. Continue taking this medication, and follow the directions you see here.   docusate sodium 100 MG capsule Commonly known as: COLACE Take 1 capsule (100 mg total) by mouth 2 (two) times daily.   lamoTRIgine 100 MG tablet Commonly known as: LAMICTAL Take 1 tablet (100 mg total) by mouth at bedtime.   levothyroxine 175 MCG tablet Commonly known as: SYNTHROID Take 175 mcg by mouth daily.   metoprolol tartrate 25 MG tablet Commonly known as: LOPRESSOR Take 0.5 tablets (12.5 mg total) by mouth 2 (two) times daily. What changed: how much to take   NAC 500 MG Caps Generic drug: Acetylcysteine Take 1 capsule by mouth 2 (two) times daily.   pantoprazole 40 MG tablet Commonly known as: PROTONIX Take 1 tablet (40 mg total) by mouth 2 (two) times daily before a meal.   polyethylene glycol 17 g packet Commonly known as: MIRALAX / GLYCOLAX Take 17 g by mouth 2 (two) times daily.   QUEtiapine 400 MG tablet Commonly known as: SEROQUEL Take 1 tablet (400 mg total) by mouth 2 (two) times daily.   rosuvastatin 20 MG tablet Commonly known as: CRESTOR Take 1 tablet (20 mg total) by mouth daily.   Vitamin D 50 MCG (2000 UT) tablet Take 1 tablet (2,000 Units total) by mouth daily.        Follow-up Information     Connect with your PCP/Specialist as discussed. Schedule an appointment as soon as possible  for a visit .   Contact information: [email or web address] Call our physician referral line at [phone].        [facility]. Schedule an appointment as soon as possible for a visit.   Specialty: Rehabilitation Contact information: [address] [place], [id number] mc [place] [address] [phone]        Levert Feinstein, MD Follow up.   Specialty: Neurology Why: Hospital follow up, as scheduled Contact information: [address] [place] [address] [phone]                Discharge Exam: Ceasar Mons Weights   06/04/23 2100  Weight: 66.2 kg   General exam: Awake, laying in bed, in nad Respiratory system: Normal respiratory effort, no wheezing Cardiovascular system: regular rate, s1, s2 Gastrointestinal system: Soft, nondistended, positive BS Central nervous system: CN2-12 grossly intact, strength intact Extremities: Perfused, no clubbing Skin: Normal skin turgor, no notable skin lesions seen Psychiatry: Difficult to assess given mentation  Condition at discharge: fair  The results of significant diagnostics from this hospitalization (including imaging, microbiology, ancillary and laboratory) are listed below for reference.   Imaging Studies: ECHOCARDIOGRAM COMPLETE  Result Date: 06/05/2023  ECHOCARDIOGRAM REPORT   Patient Name:   SOURISH MONIER Date of Exam: 06/05/2023 Medical Rec #:  [id number]            Height:       63.0 in Accession #:    [id number]           Weight:       145.9 lb Date of Birth:  01-31-1956           BSA:          1.691 m Patient Age:    66 years             BP:           130/78 mmHg Patient Gender: M                    HR:           68 bpm. Exam Location:  Inpatient Procedure: 2D Echo, Cardiac Doppler and Color Doppler Indications:    TIA  History:        Patient has prior history of Echocardiogram examinations, most                 recent  11/26/2020. Stroke and seizure disorder; Risk                 Factors:Hypertension and Diabetes.  Sonographer:    Wallie Char Referring Phys: [id number] ALLISON L ELLIS IMPRESSIONS  1. Left ventricular ejection fraction, by estimation, is 60 to 65%. The left ventricle has normal function. The left ventricle has no regional wall motion abnormalities. Left ventricular diastolic parameters are consistent with Grade I diastolic dysfunction (impaired relaxation).  2. Right ventricular systolic function is normal. The right ventricular size is normal.  3. The mitral valve is grossly normal. No evidence of mitral valve regurgitation.  4. The aortic valve is tricuspid. Aortic valve regurgitation is not visualized. No aortic stenosis is present.  5. The inferior vena cava is normal in size with greater than 50% respiratory variability, suggesting right atrial pressure of 3 mmHg. Comparison(s): No significant change from prior study. FINDINGS  Left Ventricle: Left ventricular ejection fraction, by estimation, is 60 to 65%. The left ventricle has normal function. The left ventricle has no regional wall motion abnormalities. The left ventricular internal cavity size was small. There is no left ventricular hypertrophy. Left ventricular diastolic parameters are consistent with Grade I diastolic dysfunction (impaired relaxation). Right Ventricle: The right ventricular size is normal. No increase in right ventricular wall thickness. Right ventricular systolic function is normal. Left Atrium: Left atrial size was normal in size. Right Atrium: Right atrial size was normal in size. Pericardium: There is no evidence of pericardial effusion. Mitral Valve: The mitral valve is grossly normal. No evidence of mitral valve regurgitation. MV peak gradient, 4.3 mmHg. The mean mitral valve gradient is 1.0 mmHg. Tricuspid Valve: The tricuspid valve is normal in structure. Tricuspid valve regurgitation is trivial. No evidence of tricuspid stenosis.  Aortic Valve: The aortic valve is tricuspid. Aortic valve regurgitation is not visualized. No aortic stenosis is present. Aortic valve mean gradient measures 3.5 mmHg. Aortic valve peak gradient measures 6.6 mmHg. Aortic valve area, by VTI measures 1.47 cm. Pulmonic Valve: The pulmonic valve was not well visualized. Pulmonic valve regurgitation is not visualized. No evidence of pulmonic stenosis. Aorta: The aortic root and ascending aorta are structurally normal, with no evidence of dilitation. Venous: The inferior vena cava is normal in size  with greater than 50% respiratory variability, suggesting right atrial pressure of 3 mmHg. IAS/Shunts: No atrial level shunt detected by color flow Doppler.  LEFT VENTRICLE PLAX 2D LVIDd:         3.60 cm     Diastology LVIDs:         2.40 cm     LV e' medial:    7.43 cm/s LV PW:         0.90 cm     LV E/e' medial:  11.6 LV IVS:        0.90 cm     LV e' lateral:   9.44 cm/s LVOT diam:     1.50 cm     LV E/e' lateral: 9.1 LV SV:         44 LV SV Index:   26 LVOT Area:     1.77 cm  LV Volumes (MOD) LV vol d, MOD A2C: 49.4 ml LV vol d, MOD A4C: 41.2 ml LV vol s, MOD A2C: 16.9 ml LV vol s, MOD A4C: 15.2 ml LV SV MOD A2C:     32.5 ml LV SV MOD A4C:     41.2 ml LV SV MOD BP:      31.0 ml RIGHT VENTRICLE             IVC RV Basal diam:  2.30 cm     IVC diam: 2.00 cm RV S prime:     11.40 cm/s TAPSE (M-mode): 1.8 cm LEFT ATRIUM             Index        RIGHT ATRIUM           Index LA diam:        3.70 cm 2.19 cm/m   RA Area:     12.00 cm LA Vol (A2C):   45.2 ml 26.73 ml/m  RA Volume:   24.30 ml  14.37 ml/m LA Vol (A4C):   37.5 ml 22.17 ml/m LA Biplane Vol: 42.4 ml 25.07 ml/m  AORTIC VALVE AV Area (Vmax):    1.45 cm AV Area (Vmean):   1.46 cm AV Area (VTI):     1.47 cm AV Vmax:           128.50 cm/s AV Vmean:          90.400 cm/s AV VTI:            0.298 m AV Peak Grad:      6.6 mmHg AV Mean Grad:      3.5 mmHg LVOT Vmax:         105.50 cm/s LVOT Vmean:        74.550 cm/s LVOT  VTI:          0.249 m LVOT/AV VTI ratio: 0.83  AORTA Ao Root diam: 3.00 cm Ao Asc diam:  2.80 cm MITRAL VALVE               TRICUSPID VALVE MV Area (PHT): 3.56 cm    TR Peak grad:   38.4 mmHg MV Area VTI:   1.71 cm    TR Vmax:        310.00 cm/s MV Peak grad:  4.3 mmHg MV Mean grad:  1.0 mmHg    SHUNTS MV Vmax:       1.04 m/s    Systemic VTI:  0.25 m MV Vmean:      47.8 cm/s   Systemic Diam: 1.50 cm MV Decel Time: 213 msec  MV E velocity: 86.30 cm/s MV A velocity: 82.60 cm/s MV E/A ratio:  1.04 Riley Lam MD Electronically signed by Riley Lam MD Signature Date/Time: 06/05/2023/2:59:46 PM    Final    EEG adult  Result Date: 06/05/2023 Charlsie Quest, MD     06/05/2023  9:07 AM Patient Name: DIANGELO BIBER MRN: [id number] Epilepsy Attending: Charlsie Quest Referring Physician/Provider: Rejeana Brock, MD Date: 06/05/2023 Duration: 25.37 mins Patient history:  67 year old male with abrupt worsening of mental status shortly after receiving medications. EEG to evaluate for seizure. Level of alertness: Awake, asleep AEDs during EEG study: None Technical aspects: This EEG study was done with scalp electrodes positioned according to the 10-20 International system of electrode placement. Electrical activity was reviewed with band pass filter of 1-70Hz , sensitivity of 7 uV/mm, display speed of 54mm/sec with a 60Hz  notched filter applied as appropriate. EEG data were recorded continuously and digitally stored.  Video monitoring was available and reviewed as appropriate. Description: The posterior dominant rhythm consists of 7 Hz activity of moderate voltage (25-35 uV) seen predominantly in posterior head regions, symmetric and reactive to eye opening and eye closing. Sleep was characterized by vertex waves, sleep spindles (12 to 14 Hz), maximal frontocentral region. EEG showed continuous generalized 5 to 6 Hz theta slowing. Hyperventilation and photic stimulation were not performed.    ABNORMALITY - Continuous slow, generalized - Background slow IMPRESSION: This study is suggestive of moderate diffuse encephalopathy, nonspecific etiology. No seizures or epileptiform discharges were seen throughout the recording. Charlsie Quest   MR BRAIN WO CONTRAST  Result Date: 06/05/2023 CLINICAL DATA:  Mental status change, unknown cause EXAM: MRI HEAD WITHOUT CONTRAST TECHNIQUE: Multiplanar, multiecho pulse sequences of the brain and surrounding structures were obtained without intravenous contrast. COMPARISON:  CT Head 06/04/23, MR  head 04/10/23 FINDINGS: Brain: Negative for an acute infarct. No hemorrhage. No hydrocephalus. No extra-axial fluid collection. There is ex vacuo dilatation of the right frontal horn. Redemonstrated large region of encephalomalacia in the right frontal lobe and opercular region. There is sequela of mild to moderate overall chronic microvascular ischemic change. Vascular: Normal flow voids. Skull and upper cervical spine: Right frontal craniotomy. Otherwise unremarkable. Sinuses/Orbits: No middle ear or mastoid effusion. Paranasal sinuses are clear. 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 Orbits are otherwise unremarkable. Other: None. IMPRESSION: No acute intracranial process. Electronically Signed   By: Lorenza Cambridge M.D.   On: 06/05/2023 08:17   CT HEAD CODE STROKE WO CONTRAST  Result Date: 06/04/2023 CLINICAL DATA:  Code stroke. EXAM: CT HEAD WITHOUT CONTRAST TECHNIQUE: Contiguous axial images were obtained from the base of the skull through the vertex without intravenous contrast. RADIATION DOSE REDUCTION: This exam was performed according to the departmental dose-optimization program which includes automated exposure control, adjustment of the mA and/or kV according to patient size and/or use of iterative reconstruction technique. COMPARISON:  Prior study from 05/25/2023. FINDINGS: Brain: Postoperative changes from prior craniotomy noted. Extensive encephalomalacia involving  the right frontal region, stable. Underlying atrophy with chronic small vessel ischemic disease. No acute intracranial hemorrhage. No acute large vessel territory infarct. No mass lesion or midline shift. No hydrocephalus or extra-axial fluid collection. Vascular: No abnormal hyperdense vessel. Scattered vascular calcifications noted within the carotid siphons. Skull: Scalp soft tissues and calvarium demonstrate no acute finding. Sinuses/Orbits: Globes orbital soft tissues within normal limits. Paranasal sinuses and mastoid air cells are clear. Other: None. ASPECTS [facility] Stroke Program Early CT Score) - Ganglionic level infarction (caudate, lentiform nuclei, internal  capsule, insula, M1-M3 cortex): 7 - Supraganglionic infarction (M4-M6 cortex): 3 Total score (0-10 with 10 being normal): 10 IMPRESSION: 1. No acute intracranial abnormality. 2. ASPECTS is 10. 3. Atrophy with chronic small vessel ischemic disease with chronic right cerebral encephalomalacia, stable. These results were communicated to Dr. Amada Jupiter at 9:50 pm on 06/04/2023 by text page via the [facility] messaging system. Electronically Signed   By: Rise Mu M.D.   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 EEG to evaluate for seizure Level of alertness: Awake AEDs during EEG study: LTG, VPA Technical aspects: This EEG study was done with scalp electrodes positioned according to the 10-20 International system of electrode placement. Electrical activity was reviewed with band pass filter of  1-70Hz , sensitivity of 7 uV/mm, display speed of 73mm/sec with a 60Hz  notched filter applied as appropriate. EEG data were recorded continuously and digitally stored.  Video monitoring was available and reviewed as appropriate. Description: The posterior dominant rhythm consists of 7.5 Hz activity of moderate voltage (25-35 uV) seen predominantly in posterior head regions, symmetric and reactive to eye opening and eye closing. EEG showed continuous 3 to 6 Hz theta-delta slowing in right frontal region. Photic driving was not seen during photic stimulation. Hyperventilation was not performed.    ABNORMALITY - Continuous slow, right frontal region  IMPRESSION: This study is suggestive of cortical dysfunction arising from right frontal region likely secondary to underlying encephalomalacia. No seizures or epileptiform discharges were seen throughout the recording.  Charlsie Quest   CT Head Wo Contrast  Result Date: 05/25/2023 CLINICAL DATA:  Altered mental status EXAM: CT HEAD WITHOUT CONTRAST TECHNIQUE: Contiguous axial images were obtained from the base of the skull through the vertex without intravenous contrast. RADIATION DOSE REDUCTION: This exam was performed according to the departmental dose-optimization program which includes automated exposure control, adjustment of the mA and/or kV according to patient size and/or use of iterative reconstruction technique. COMPARISON:  04/09/2023 FINDINGS: Brain: No acute intracranial findings are seen. There is large area of encephalomalacia in the right frontal lobe. There is right frontal craniotomy. There is ex vacuo dilation of right lateral ventricle. There are no signs of bleeding within the cranium. There is no focal mass effect. There are small linear foci of fat attenuation adjacent to the falx suggesting possible small lipomas. Vascular: Unremarkable. Skull: There is right frontal craniotomy. Sinuses/Orbits: There is mucosal thickening in left maxillary  sinus. Other: No significant interval changes are noted. IMPRESSION: There is large area of encephalomalacia in the right frontal lobe with no significant change. No acute intracranial findings are seen. Electronically Signed   By: Ernie Avena M.D.   On: 05/25/2023 13:47   DG Chest Port 1 View  Result Date: 05/25/2023 CLINICAL DATA:  Acute mental status change EXAM: PORTABLE CHEST 1 VIEW COMPARISON:  March 15, 2023 FINDINGS: The heart size and mediastinal contours are within normal limits. Both lungs are clear. The visualized skeletal structures are unremarkable. IMPRESSION: No active disease. Electronically Signed   By: Gerome Sam III M.D.   On: 05/25/2023 13:16   EEG adult  Result Date: 05/13/2023 Levert Feinstein, MD     05/13/2023 10:25 PM HISTORY: 67 year old male with history of complex partial seizure TECHNIQUE: This is a routine 16 channel EEG recording with one channel devoted to a limited EKG recording.  It was performed during wakefulness, drowsiness and asleep.  Photic stimulation were performed as activating procedures.  There are minimum muscle and movement artifact noted. Upon maximum arousal, posterior dominant waking rhythm consistent of mildly dysrhythmic theta range activity. Activities are symmetric over the bilateral posterior derivations and attenuated with eye opening. Photic stimulation did not alter the tracing. Hyperventilation was not performed During EEG recording, patient developed drowsiness and no deeper stage of sleep was achieved During EEG recording, there was no epileptiform discharge noted. EKG demonstrate normal sinus rhythm. CONCLUSION: This is a mild abnormal study due to mild background slowing, indicating mild bihemispheric malfunction. Levert Feinstein, M.D. Ph.D. [facility] [address] [place], [place] [address] Phone: [phone] Fax:      [phone]    Microbiology: Results for orders placed or performed during the hospital encounter of  11/15/20  SARS Coronavirus 2 by RT PCR (hospital order, performed in [facility] hospital lab) Nasopharyngeal Nasopharyngeal Swab     Status: None   Collection Time: 12/06/20  6:50 PM   Specimen: Nasopharyngeal Swab  Result Value Ref Range Status   SARS Coronavirus 2 NEGATIVE NEGATIVE Final    Comment: (NOTE) SARS-CoV-2 target nucleic acids are NOT DETECTED.  The SARS-CoV-2 RNA is generally detectable in upper and lower respiratory specimens during the acute phase of infection. The lowest concentration of SARS-CoV-2 viral copies this assay can detect is 250 copies / mL. A negative result does not preclude SARS-CoV-2 infection and should not be used as the sole basis for treatment or other patient management decisions.  A negative result may occur with improper specimen collection / handling, submission of specimen other than nasopharyngeal swab, presence of viral mutation(s) within the areas targeted by this assay, and inadequate number of viral copies (<250 copies / mL). A negative result must be combined with clinical observations, patient history, and epidemiological information.  Fact Sheet for Patients:   BoilerBrush.com.cy  Fact Sheet for Healthcare Providers: https://pope.com/  This test is not yet approved or  cleared by the [place] FDA and has been authorized for detection and/or diagnosis of SARS-CoV-2 by FDA under an Emergency Use Authorization (EUA).  This EUA will remain in effect (meaning this test can be used) for the duration of the COVID-19 declaration under Section 564(b)(1) of the Act, 21 U.S.C. section 360bbb-3(b)(1), unless the authorization is terminated or revoked sooner.  Performed at [facility], [address] [place], [place] [address]     Labs: CBC: Recent Labs  Lab 06/04/23 2225 06/04/23 2234 06/05/23 1214  WBC 4.0  --   --   NEUTROABS 1.7  --   --   HGB 14.5 15.0 13.6  HCT 43.7  44.0 40.0  MCV 98.9  --   --   PLT 161  --   --    Basic Metabolic Panel: Recent Labs  Lab 06/04/23 2225 06/04/23 2234 06/05/23 1214 06/06/23 0659  NA 137 140 141 135  K 4.2 4.1 4.1 4.0  CL 103 104  --  102  CO2 25  --   --  25  GLUCOSE 111* 106*  --  104*  BUN 12 13  --  7*  CREATININE 1.61* 1.60*  --  1.07  CALCIUM 8.8*  --   --  8.3*   Liver Function Tests: Recent Labs  Lab 06/04/23 2225  AST 21  ALT 13  ALKPHOS 62  BILITOT 0.3  PROT 6.3*  ALBUMIN 3.5   CBG: No results for input(s): "GLUCAP" in the last 168 hours.  Discharge time spent: less than 30 minutes.  Signed: Rickey Barbara, MD [facility] 06/06/2023

## 2023-06-06 NOTE — Evaluation (Signed)
Physical Therapy Evaluation Patient Details Name: [name] MRN: [id number] DOB: [birth year] Today's Date: 06/06/2023  History of Present Illness  Pt is a 67 y/o M admitted on 06/04/23 after presenting as a code stroke with slurred speech. Pt is being treated for acute metabolic encephalopathy (pt tested positive for benzos). PMH: developmental disability, HTN, GERD, barrett's esophagitis, GI bleeding, schizophrenia, hypothyroidism, CVA with L spastic hemiplegia, DM2, benign intracranial tumor s/p craniotomy 2004  Clinical Impression  Pt seen for PT evaluation with pt agreeable to tx. Pt is pleasant and oriented to self only, follows simple commands inconsistently throughout session. Pt requires min assist for bed mobility, mod assist stand pivot without AD, and mod<>max assist to attempt short distance gait with RW. Pt presents with ongoing L hemiplegia, decreased safety awareness, & decreased balance. Pt would benefit from ongoing PT services to address deficits noted below.       Recommendations for follow up therapy are one component of a multi-disciplinary discharge planning process, led by the attending physician.  Recommendations may be updated based on patient status, additional functional criteria and insurance authorization.  Follow Up Recommendations Can patient physically be transported by private vehicle: Yes     Assistance Recommended at Discharge Frequent or constant Supervision/Assistance  Patient can return home with the following  A lot of help with bathing/dressing/bathroom;Assistance with cooking/housework;Direct supervision/assist for medications management;Direct supervision/assist for financial management;Assist for transportation;Help with stairs or ramp for entrance;A lot of help with walking and/or transfers    Equipment Recommendations None recommended by PT  Recommendations for Other Services       Functional Status Assessment Patient has had a recent  decline in their functional status and demonstrates the ability to make significant improvements in function in a reasonable and predictable amount of time.     Precautions / Restrictions Precautions Precautions: Fall Precaution Comments: hx of L spastic hemiplegia Restrictions Weight Bearing Restrictions: No      Mobility  Bed Mobility Overal bed mobility: Needs Assistance Bed Mobility: Supine to Sit, Sit to Supine     Supine to sit: Min assist, HOB elevated     General bed mobility comments: Pt pulls on PT's hand to assist with bed mobility despite PT cuing him to use bed rail to assist, extra time & effortful for pt to come to sitting EOB    Transfers Overall transfer level: Needs assistance Equipment used: None Transfers: Bed to chair/wheelchair/BSC Sit to Stand: Min assist Stand pivot transfers: Min assist Step pivot transfers: Mod assist (bed>recliner on L with PT providing mod assist for balance; pt requires extra time to weight shift but is able to advance LLE to step to recliner)            Ambulation/Gait Ambulation/Gait assistance: Mod assist, Max assist Gait Distance (Feet): 8 Feet Assistive device: Rolling walker (2 wheels) Gait Pattern/deviations: Decreased stride length, Decreased stance time - left, Decreased weight shift to left, Decreased step length - right, Decreased step length - left Gait velocity: decreased     General Gait Details: Pt somewhat able to grasp RW with LUE but unable to maintain neutral positioning as wrist rolls out to L side. Pt with decreased awareness of need to ambulate within base of AD, feet stepping outside of RW especially when turning.  Stairs            Wheelchair Mobility    Modified Rankin (Stroke Patients Only)       Balance Overall balance assessment: Needs assistance  Sitting-balance support: Single extremity supported, Feet supported Sitting balance-Leahy Scale: Fair Sitting balance - Comments: close  supervision static sitting EOB   Standing balance support: Bilateral upper extremity supported, During functional activity Standing balance-Leahy Scale: Poor                               Pertinent Vitals/Pain Pain Assessment Pain Assessment: No/denies pain    Home Living Family/patient expects to be discharged to:: Group home                        Prior Function               Mobility Comments: Pt unable to report. Per chart, pt was ambulatory with rollator in the past but recently participating in OPPT working on walking again.       Hand Dominance   Dominant Hand: Right    Extremity/Trunk Assessment   Upper Extremity Assessment LUE Deficits / Details: hx of L spastic hemiplegia, decreased LUE grip strength, unable to fully formally assess 2/2 cognitive deficits. LUE Coordination: decreased fine motor;decreased gross motor    Lower Extremity Assessment Lower Extremity Assessment: LLE deficits/detail LLE Deficits / Details: hx of L spastic hemiplegia, decreased neuromuscular control LLE. LLE Coordination: decreased gross motor;decreased fine motor    Cervical / Trunk Assessment Cervical / Trunk Assessment: Normal  Communication   Communication: Expressive difficulties  Cognition Arousal/Alertness: Awake/alert Behavior During Therapy: WFL for tasks assessed/performed, Impulsive Overall Cognitive Status: History of cognitive impairments - at baseline                                 General Comments: Pt with hx of cognitive deficits, unable to provide PLOF/home set up information. Follows simple commands inconsistently throughout session, impulsive at times with mobiltiy.        General Comments      Exercises     Assessment/Plan    PT Assessment Patient needs continued PT services  PT Problem List Decreased strength;Decreased activity tolerance;Decreased balance;Decreased mobility;Decreased coordination;Decreased  cognition;Decreased safety awareness;Decreased range of motion;Decreased knowledge of use of DME       PT Treatment Interventions DME instruction;Gait training;Functional mobility training;Therapeutic activities;Neuromuscular re-education;Balance training;Therapeutic exercise;Cognitive remediation;Patient/family education;Wheelchair mobility training;Manual techniques    PT Goals (Current goals can be found in the Care Plan section)  Acute Rehab PT Goals PT Goal Formulation: Patient unable to participate in goal setting Time For Goal Achievement: 06/20/23 Potential to Achieve Goals: Fair    Frequency Min 2X/week     Co-evaluation               AM-PAC PT "6 Clicks" Mobility  Outcome Measure Help needed turning from your back to your side while in a flat bed without using bedrails?: A Little Help needed moving from lying on your back to sitting on the side of a flat bed without using bedrails?: A Lot Help needed moving to and from a bed to a chair (including a wheelchair)?: A Lot Help needed standing up from a chair using your arms (e.g., wheelchair or bedside chair)?: A Little Help needed to walk in hospital room?: A Lot Help needed climbing 3-5 steps with a railing? : Total 6 Click Score: 13    End of Session Equipment Utilized During Treatment: Gait belt Activity Tolerance: Patient tolerated treatment well Patient left: in  chair;with chair alarm set;with call bell/phone within reach (MD in room) Nurse Communication:  (notified MD of pt's mobiltiy status) PT Visit Diagnosis: Unsteadiness on feet (R26.81);Muscle weakness (generalized) (M62.81);Difficulty in walking, not elsewhere classified (R26.2)    Time: 0981-1914 PT Time Calculation (min) (ACUTE ONLY): 17 min   Charges:   PT Evaluation $PT Eval Moderate Complexity: 1 Mod          Aleda Grana, PT, DPT 06/06/23, 1:35 PM   Sandi Mariscal 06/06/2023, 1:34 PM

## 2023-06-06 NOTE — Care Management Obs Status (Signed)
MEDICARE OBSERVATION STATUS NOTIFICATION   Patient Details  Name: [name] MRN: [id number] Date of Birth: 10-19-1956   Medicare Observation Status Notification Given:  Yes    Kermit Balo, RN 06/06/2023, 3:53 PM

## 2023-06-06 NOTE — Plan of Care (Signed)
  Problem: Education: Goal: Knowledge of General Education information will improve Description Including pain rating scale, medication(s)/side effects and non-pharmacologic comfort measures Outcome: Progressing   Problem: Health Behavior/Discharge Planning: Goal: Ability to manage health-related needs will improve Outcome: Progressing   

## 2023-06-06 NOTE — Plan of Care (Signed)
  Problem: Education: Goal: Knowledge of General Education information will improve Description: Including pain rating scale, medication(s)/side effects and non-pharmacologic comfort measures 06/06/2023 0011 by Charmian Muff, RN Outcome: Progressing 06/06/2023 0010 by Charmian Muff, RN Outcome: Progressing   Problem: Health Behavior/Discharge Planning: Goal: Ability to manage health-related needs will improve 06/06/2023 0011 by Charmian Muff, RN Outcome: Progressing 06/06/2023 0010 by Charmian Muff, RN Outcome: Progressing

## 2023-06-06 NOTE — TOC Transition Note (Signed)
Transition of Care [facility]) - CM/SW Discharge Note   Patient Details  Name: [name] MRN: [id number] Date of Birth: October 22, 1956  Transition of [facility] CM/SW Contact:  Kermit Balo, RN Phone Number: 06/06/2023, 3:57 PM   Clinical Narrative:    Pt is from [facility] group home. He is returning today. He was active with [facility] prior to admission and will continue after d/c. CM has sent referral to [facility] outpt therapy. [facility] is providing transport home.  Cm has updated his POA/ sister/ guardian: [name] and she is in agreement.   Final next level of care: Group Home Barriers to Discharge: No Barriers Identified   Patient Goals and CMS Choice      Discharge Placement                         Discharge Plan and Services Additional resources added to the After Visit Summary for                                       Social Determinants of Health (SDOH) Interventions SDOH Screenings   Food Insecurity: No Food Insecurity (06/05/2023)  Housing: Low Risk  (06/05/2023)  Transportation Needs: No Transportation Needs (06/05/2023)  Utilities: Not At Risk (06/05/2023)  Depression (PHQ2-9): Low Risk  (07/10/2022)  Tobacco Use: Low Risk  (06/06/2023)     Readmission Risk Interventions     No data to display

## 2023-06-06 NOTE — Plan of Care (Signed)

## 2023-06-06 NOTE — Hospital Course (Signed)
67 y.o. male with medical history significant of developmental disability, reported schizophrenia, hypertension, GERD with associated Barrett's esophagitis, prior history of GI bleeding, hypertension, hypothyroidism, seizure disorder, prior CVA with left spastic hemiplegia, dyslipidemia.  Patient was sent by EMS t the group home to the ED for reports of slurred speech.  Code stroke was initiated at time of presentation.  Patient had no focal neurological deficits.  CT head without contrast code stroke protocol revealed no acute intracranial abnormality.  Patient has atrophy and chronic small vessel ischemic disease with chronic right cerebral encephalomalacia which is stable.  Dr. Amada Jupiter was the neurologist on-call.  Neurology noted that he was lethargic/encephalopathic but when awakened he would answer questions but without direct noxious stimulation he was very dysarthric but he cleared as he awoke.  He also documented that in the past his seizures have not significantly affected his mental status and postictal states are not characteristic for this patient.  Recommendation was to check a lamotrigine level continue home Depakote and lamotrigine doses.  It is noted the patient is on a very high dose of Seroquel 400 mg twice daily so decreasing that dose was recommended.  EEG and MRI recommended as well as ammonia and blood gas.   In the ED the patient remained afebrile, normotensive, heart rate mid 50s bradycardic up to the 60s, room air sats remained normal.  Patient appeared to be volume depleted noting creatinine up to 1.6 with a baseline creatinine between 1.24 and 1.13.  Ammonia level was normal, troponin was normal, labs otherwise within normal limits.  No leukocytosis.  MRI was obtained and revealed no acute intracranial process.  There was also evidence of prior right frontal craniotomy.  ABG pending at time of dictation.  Of note urine drug screen positive for benzodiazepines.  Lamotrigine level was  normal at 8.4 valproic acid level was slightly elevated at 112

## 2023-06-06 NOTE — Telephone Encounter (Signed)
Charge Nurse Note:   Patient's Legal Guardian and POA [name] called regarding discrepancy in dosage of Seroquel for Mr. [name] upon discharge. She called with [facility] staff on a 3-way-call. She states that patient's dose of Seroquel is unclear as the After Visit Summary has dose listed as 400 mg twice daily but in Dr. Johnna Acosta recommendations on Discharge Summary Note, dose is noted at 200 mg twice a day. Dr. Rhona Leavens is off site and not on call at this time. Unable to clarify this with Dr. Rhona Leavens at this time. Advised [name] to call back at [facility] tomorrow 06/07/2023 to clarify.  This RN left a note for oncoming charge RN and passed along information to current shift Charge RN Philomena to be aware of [name]'s concern.

## 2023-06-11 ENCOUNTER — Ambulatory Visit: Payer: Medicare Other

## 2023-06-11 ENCOUNTER — Ambulatory Visit: Payer: Medicare Other | Admitting: Occupational Therapy

## 2023-06-11 NOTE — Therapy (Addendum)
  OUTPATIENT PHYSICAL THERAPY NEURO NOTE  Patient Name: [name] MRN: [id number] DOB:1956-08-02, 67 y.o., male Today's Date: 06/12/2023  PCP: Lucretia Field REFERRING PROVIDER: Rolly Salter, MD   Will void this note as patient and Guardian have agreed to resume and participate in outpatient therapies.  Anette Guarneri, PT, DPT 06/20/23 4:35 PM  [facility] at [facility] [address] [place], [address] [place], [place] [address] Phone # [phone] Fax # [phone]    Patient arrived to session with caregiver, Ardis. Patient is slightly agitated and refuses PT session today. Upon discussion with caregiver, agreed on trying HHPT and HHOT to allow for reduced caregiver burden and hopeful improvement in participation. Discharging patient at this time d/t poor patient participation.      PHYSICAL THERAPY DISCHARGE SUMMARY  Visits from Start of Care: 6  Current functional level related to goals / functional outcomes: Unable to assess d/t patient refusal    Remaining deficits: Unable to assess   Education / Equipment: HEP  Plan: Patient agrees to discharge.  Patient goals were not met. Patient is being discharged due to poor patient participation.     Anette Guarneri, PT, DPT 06/12/23 2:11 PM  [facility] at [facility] [address] [place], [address] [place], [place] [address] Phone # [phone] Fax # [phone]

## 2023-06-12 ENCOUNTER — Ambulatory Visit: Payer: Medicare Other | Admitting: Occupational Therapy

## 2023-06-12 ENCOUNTER — Telehealth: Payer: Self-pay | Admitting: Physical Therapy

## 2023-06-12 ENCOUNTER — Encounter: Payer: Self-pay | Admitting: Physical Therapy

## 2023-06-12 ENCOUNTER — Ambulatory Visit: Payer: Medicare Other | Admitting: Physical Therapy

## 2023-06-12 DIAGNOSIS — M6281 Muscle weakness (generalized): Secondary | ICD-10-CM

## 2023-06-12 DIAGNOSIS — R2681 Unsteadiness on feet: Secondary | ICD-10-CM

## 2023-06-12 DIAGNOSIS — I69354 Hemiplegia and hemiparesis following cerebral infarction affecting left non-dominant side: Secondary | ICD-10-CM

## 2023-06-12 DIAGNOSIS — R262 Difficulty in walking, not elsewhere classified: Secondary | ICD-10-CM

## 2023-06-12 DIAGNOSIS — R278 Other lack of coordination: Secondary | ICD-10-CM

## 2023-06-12 NOTE — Telephone Encounter (Signed)
Hi Dr. Dayna Barker,  I have been seeing Mr. [name] in [place] with limited success d/t behavioral issue/patient refusal. Upon discussion with his caregiver, he may be more willing to participate in therapy sessions in his home environment. Thus, he may benefit from PT and OT [facility] referral. Please advise.  Thanks!   Anette Guarneri, PT, DPT 06/12/23 2:15 PM  [facility] at [facility] [address] [place], [address] [place], [place] [address] Phone # [phone] Fax # [phone]

## 2023-06-12 NOTE — Therapy (Addendum)
OUTPATIENT OCCUPATIONAL THERAPY NEURO  Treatment Note   Patient Name: [name] MRN: [id number] DOB:[birth year], 67 y.o., male Today's Date: 06/12/2023  PCP: Lucretia Field REFERRING PROVIDER: Rolly Salter, MD  NOTE: Plan was to pursue home health to increase pt participation, however pt's group home representative reports that he cannot get home health.  Pt's POA (his sister) would like for pt to continue with therapy services in this clinic.  Plan to re-cert at next scheduled visit.    Rosalio Loud, OT 07/02/23    END OF SESSION:  OT End of Session - 06/12/23 1320     Visit Number 7    Number of Visits 7    Date for OT Re-Evaluation 06/12/23    Authorization Type Medicare A&B    OT Start Time 1325   pt in bathroom at start of session   OT Stop Time 1346    OT Time Calculation (min) 21 min    Activity Tolerance Patient tolerated treatment well;No increased pain;Patient limited by fatigue    Behavior During Therapy Flat affect;WFL for tasks assessed/performed;Impulsive                Past Medical History:  Diagnosis Date   Barrett esophagus    DM II (diabetes mellitus, type II), controlled (HCC) 11/13/2020   Dysphagia    Gastroparesis    GERD (gastroesophageal reflux disease)    Hypercholesteremia    Hypertension    Iron deficiency anemia    Mental retardation    Schizophrenia (HCC)    Seizure (HCC)    Stroke [facility])    Past Surgical History:  Procedure Laterality Date   BIOPSY  10/21/2020   Procedure: BIOPSY;  Surgeon: Iva Boop, MD;  Location: [facility] ENDOSCOPY;  Service: Endoscopy;;   CRANIOTOMY FOR CYST FENESTRATION     ESOPHAGOGASTRODUODENOSCOPY N/A 01/09/2015   Procedure: ESOPHAGOGASTRODUODENOSCOPY (EGD);  Surgeon: Louis Meckel, MD;  Location: [facility] ENDOSCOPY;  Service: Endoscopy;  Laterality: N/A;   ESOPHAGOGASTRODUODENOSCOPY N/A 10/26/2020   Procedure: ESOPHAGOGASTRODUODENOSCOPY (EGD);  Surgeon: Corliss Skains, MD;  Location: [facility]  OR;  Service: Thoracic;  Laterality: N/A;   ESOPHAGOGASTRODUODENOSCOPY (EGD) WITH PROPOFOL N/A 10/21/2020   Procedure: ESOPHAGOGASTRODUODENOSCOPY (EGD) WITH PROPOFOL;  Surgeon: Iva Boop, MD;  Location: [facility] ENDOSCOPY;  Service: Endoscopy;  Laterality: N/A;   IR GASTR TUBE CONVERT GASTR-JEJ PER W/FL MOD SED  11/07/2020   PEG PLACEMENT N/A 10/26/2020   Procedure: PERCUTANEOUS ENDOSCOPIC GASTROSTOMY (PEG) PLACEMENT;  Surgeon: Corliss Skains, MD;  Location: [facility] OR;  Service: Thoracic;  Laterality: N/A;   XI ROBOTIC ASSISTED HIATAL HERNIA REPAIR N/A 10/26/2020   Procedure: XI ROBOTIC ASSISTED HIATAL HERNIA REPAIR;  Surgeon: Corliss Skains, MD;  Location: [facility] OR;  Service: Thoracic;  Laterality: N/A;   Patient Active Problem List   Diagnosis Date Noted   Stroke-like symptoms 06/05/2023   Mixed hyperlipidemia 05/26/2023   Intellectual developmental delay 05/26/2023   General weakness 04/09/2023   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) 10/26/2021   Seizure disorder (HCC) 10/26/2021   S/P craniotomy 03/09/2021   Dilation of esophagus    Hyponatremia    Dysphagia    Seizures (HCC)    S/P percutaneous endoscopic gastrostomy (PEG) tube placement (HCC)    Weakness 11/15/2020   Bowel obstruction (HCC)    S/P repair of paraesophageal hernia 10/26/2020   Syncope 10/21/2020   Hiatal hernia 10/21/2020   Acute hypoxemic respiratory failure (HCC) 10/21/2020  Sinus tachycardia 10/21/2020   Gastric polyps    Altered mental status 09/05/2020   Emesis 07/24/2015   Coffee ground emesis    Gastrointestinal hemorrhage associated with other gastritis    Acute esophagitis 01/09/2015   Upper gastrointestinal bleed    Diarrhea    Nausea with vomiting    GI bleed 01/08/2015   GIB (gastrointestinal bleeding) 01/08/2015   Nausea & vomiting 01/08/2015   Reflux esophagitis 07/04/2009   Loss of weight 07/04/2009   BLOOD IN STOOL, OCCULT  07/04/2009   ANEMIA, IRON DEFICIENCY 05/04/2008   Hypothyroidism 04/05/2008   Schizophrenia, unspecified type (HCC) 04/05/2008   Depression 04/05/2008   Hemiplegia (HCC) 04/05/2008   Essential hypertension 04/05/2008   GERD 04/05/2008   Gastroparesis 04/05/2008   Acute metabolic encephalopathy 04/05/2008   History of diverticulosis 08/21/2004   Barrett's esophagus 04/01/2002   HIATAL HERNIA 04/01/2002    ONSET DATE: 04/09/23  REFERRING DIAG: R53.1 (ICD-10-CM) - Left-sided weakness G40.919 (ICD-10-CM) - Breakthrough seizure  THERAPY DIAG:  Hemiplegia and hemiparesis following cerebral infarction affecting left non-dominant side (HCC)  Other lack of coordination  Muscle weakness (generalized)  Unsteadiness on feet  Rationale for Evaluation and Treatment: Rehabilitation  PERTINENT HISTORY: PMH: GERD, Barrett esophagus, gastroparesis, HTN, CVA (large R frontal) with L sided deficits, mental retardation, schizophrenia, hypothyroidism, seizures, benign intracranial tumor s/p craniotomy 2004, dysphagia, HLD "Pt states that his LUE is weak.  Pt reports that he uses a walker and w/c for mobility.  Pt with inconsistent report on PLOF and current status, therefore caregiver present for portion of session to address current status and PLOF.  Pt's caregiver reports that pt's strength is weaker on the L side and that his cognition is different.  Caregiver reports that pt is w/c level since hospitalization and requires +2 for all transfers and self-care tasks."  PRECAUTIONS: Other: seizures;  WEIGHT BEARING RESTRICTIONS: No   SUBJECTIVE:   SUBJECTIVE STATEMENT: Pt's caregiver reports no changes in function s/p hospitalization.    Pt accompanied by: self and caregiver from group home   PAIN:  Are you having pain? No   FALLS: Has patient fallen in last 6 months? No  LIVING ENVIRONMENT: Lives with: lives in an adult home Lives in: House/apartment Stairs: No Has following equipment  at home: Dan Humphreys - 4 wheeled, Wheelchair (power), shower chair, and Grab bars  PLOF:  Mod I for toileting, but Min-mod assist for ADLs.  Utilized Rollator for mobility prior to most recent hospitalization  PATIENT GOALS: to have a long life   OBJECTIVE: (All objective assessments below are from initial evaluation on: 04/25/23 unless otherwise specified.)   HAND DOMINANCE: Right  ADLs: Overall ADLs: +2 assist for all mobility and self-care tasks. +2 for transfers into shower, utilizing shower chair Transfers/ambulation related to ADLs: +2 assist Eating: setup to open containers, but then pt able to feed himself Grooming: total assist UB Dressing: total assist LB Dressing: +2 assist Toileting: +2 assist Bathing: +2 assist Tub Shower transfers: +2 assist, handicap accessible shower with seat Equipment: Shower seat with back and Walk in shower   MOBILITY STATUS:  utilizing w/c at this point for mobility  POSTURE COMMENTS:  rounded shoulders, forward head, and posterior pelvic tilt   UPPER EXTREMITY ROM:  AROM: Pt initially able to lift arm ~20-30% from arm rest, utilizing abduction to achieve ~60* shoulder flexion; however when directed to move to assess ROM pt stating no.  OT attempted to explain purpose of therapy and evaluation to assess  ROM, however pt continuing to shake head and state no to further attempts at AROM.  Did require max encouragement to assess PROM.  Passive ROM Left eval  Shoulder flexion 30*  Shoulder abduction 80*  Shoulder adduction   Shoulder extension   Shoulder internal rotation   Shoulder external rotation   Elbow flexion WNL  Elbow extension WNL  Wrist flexion 50%  Wrist extension 25%  Wrist ulnar deviation   Wrist radial deviation   Wrist pronation   Wrist supination   (Blank rows = not tested)   HAND FUNCTION: Unable to assess as pt keeping hand in partial fist.  Pt is able to open digits fully, except for thumb, with increased time and  demonstration  COORDINATION: Pt would not attempt to pick any items up with L hand despite multiple attempts and multiple items  SENSATION: Inconsistent report.  Pt reports both hands feel the same, but also reports numbness on L side since stroke  COGNITION: Overall cognitive status: History of cognitive impairments - at baseline  VISION: Subjective report: unable to assess due to inconsistent report, cognition impacting ability to attend and participate   OBSERVATIONS: Pt with inconsistent report and frequently shaking head when therapist encouraging attempts at AROM for LUE.  Pt did lift LUE spontaneously when attempting to reach towards RW during PT session (OT present as +2 initially).     TODAY'S TREATMENT:                                                                       06/12/23 Attempted to engage pt in various bimanual tasks incorporating leisure tasks with "playing ball" and fishing.  Pt initially engaging with picking up items with L hand, however after 5 with increased effort pt then stating "I don't want to do this anymore".  OT reviewed progress towards goals with pt and caregiver.  Pt has made progress towards many goals, improving from +2 to Min A with transfers and most dressing tasks.  Pt does continue to require max assist for bathing when in standing.  OT educated on use of shower chair for increased safety and independence as pt able to reach towards feet and cross leg into figure 4 position to aide in LB bathing.  Pt initially expressing desire to work on LUE to return to fishing, however unable to convince pt to further engage in any structured tasks.  Discussed with pt and caregiver plan to recert vs d/c.  After brief discussion with PT, plan to pursue home health therapies to allow for therapy in home for increased engagement and participation as well as further education on safety for caregivers.  Caregiver and pt in agreement.   05/29/23 Transfers: engaged in stand  pivot and sit <> stand transfers with min assist.  Pt requiring increased time to initiate movement but able to complete when provided with time and encouragement.  Pt demonstrating decreased placement of LLE when in standing, by placing it slightly in front but able to maintain static and simple dynamic standing balance with close supervision - CGA. LB dressing: simulated LB dressing with use of hula hoop with pt able to lift RLE without issues and increased effort to lift LLE to advance hula hoop over feet.  Pt refused to stand therefore engaged in massed practice with advance hula hoop back and forth under feet.  OT educated on use of sock aid with demonstration.  However pt able to bring RLE into figure 4 position and able to don sock with hemi-technique to open sock and then pull up.  Pt unable to achieve figure 4 with LLE, therefore OT educated on modified circle sitting at EOB to allow for increased ability to reach L foot.  Pt achieving position but then refusing to attempt to don/doff L shoe/sock. Dynamic standing: engaged in standing task with placing pieces into Connect 4 grid, initially focused on use of LUE however pt refusing attempts with LUE therefore focused on maintaining standing balance during structured task.   LUE reaching: engaged in reaching and gross grasp to pick up bean bags with L hand and drop in basket on L side.  Pt initially attempting with BUE, due to difficulty completing only with RUE.  OT then transitioning to providing cues to open L hand and then pt able to pinch between thumb, index, and long finger to sustain gross grasp and then release.  OT educated on functional carryover of gross grasp to ADLs.    05/22/23 UB dressing: OT demonstrating with use of gait belt and providing with handout for hemi-dressing technique.  Pt attempting to complete with gait belt to simulate dressing, however with decreased carryover and rushing through steps causing pt to get caught up in  gait belt.  OT provided pt with XL t-shirt with pt demonstrating improved sequencing and slowing down to allow for improved independence with donning shift.  Provided handout for caregiver as new caregivers are onboarding at pt's group home.   LUE NMR: engaged in ball toss with dowel in BUE to bat back to therapist.  Pt benefiting from hand over hand on L side to facilitate increased ROM.      PATIENT EDUCATION: Education details: ongoing condition specific education. Person educated: Patient Education method: Explanation Education comprehension: needs further education  HOME EXERCISE PROGRAM: Self-ROM (see pt instructions)  Access Code: [email or web address] URL: [email or web address] Date: 05/15/2023 Prepared by: [facility] - [facility]   GOALS: Goals reviewed with patient? Yes  SHORT TERM GOALS: Target date: 05/17/23  Pt and caregiver will be independent with simple HEP and functional activities to encourage simple assist from LUE and to increase awareness/attention of LUE. Baseline: Goal status: MET - 05/15/23  2.  Pt will tolerate standing with supervision to allow caregiver to complete clothing management/hygiene for toileting. Baseline:  Goal status: IN PROGRESS  3.  Pt will be able to complete UB dressing with min assist and min cues for hemi-technique. Baseline:  Goal status: MET - 05/22/23   LONG TERM GOALS: Target date: 06/07/23  Pt will complete toilet transfer, stand pivot, with min assist of one caregiver. Baseline:  Goal status: MET - 06/12/23  2.  Pt will assist with bathing to complete 50%of task, incorporating use of LUE at gross assist level and with use of AE prn  Baseline:  Goal status: NOT MET - requires significant assistance for balance and engagement in task  3.  Pt will complete clothing management post toileting with min assist of one caregiver for safety and sequencing. Baseline:  Goal status: MET - CGA on  06/12/23  4.  Pt will be able to complete LB dressing with mod assist and min cues for hemi-technique. Baseline:  Goal status: MET - Min A on  06/12/23   ASSESSMENT:  CLINICAL IMPRESSION: Pt with limited participation in structured tasks this session.  Reviewed safety recommendations and use of shower chair for home for decreased burden of care and safety with bathing. Pt continues to have decreased "buy in" with tasks but is able to complete many ADLs at improved level from evaluation.  Pt has progressed from +2 to min assist for sit > stand and transfers as well as min-mod assist for self-care tasks.  After discussion with PT and caregiver, plan to pursue home health therapies for increased pt participation and recommendations for decreasing burden of care.  PLAN:  OT FREQUENCY: 1x/week  OT DURATION: 6 weeks  PLANNED INTERVENTIONS: self care/ADL training, therapeutic exercise, therapeutic activity, neuromuscular re-education, manual therapy, passive range of motion, balance training, functional mobility training, splinting, ultrasound, paraffin, fluidotherapy, compression bandaging, moist heat, cryotherapy, patient/family education, cognitive remediation/compensation, visual/perceptual remediation/compensation, psychosocial skills training, coping strategies training, and DME and/or AE instructions  RECOMMENDED OTHER SERVICES: SLP eval  CONSULTED AND AGREED WITH PLAN OF CARE: Patient  PLAN FOR NEXT SESSION:  D/C   Dvaughn Fickle, OTR/L 06/12/2023, 2:18 PM

## 2023-06-14 ENCOUNTER — Telehealth: Payer: Self-pay | Admitting: Physical Therapy

## 2023-06-14 NOTE — Telephone Encounter (Signed)
Returned call from [place] [employer] therapist) from patient's group home. She reported that patient's guardian is requesting clarification on if patient is discharged from therapy or not, as group home does not allow [facility] services. Received patient's guardian's contact info [name]).   Anette Guarneri, PT, DPT 06/14/23 8:57 AM  [facility] at [facility] [address] [place], [address] [place], [place] [address] Phone # [phone] Fax # [phone]

## 2023-06-14 NOTE — Telephone Encounter (Signed)
Called patient's guardian [name], who reports that her goal for the patient is to get him back to walking with the walker. Reports that there have been some issues with the patient's group home such as finding bruises on the patient and medications in his system that have not been prescribed to him. [name] requests a call back on Monday at 8:45 AM to allow her to speak to the patient to see if he would be agreeable to continue with OPPT/OT.   Anette Guarneri, PT, DPT 06/14/23 9:09 AM  [facility] at [facility] [address] [place], [address] [place], [place] [address] Phone # [phone] Fax # [phone]

## 2023-06-17 ENCOUNTER — Telehealth: Payer: Self-pay | Admitting: Physical Therapy

## 2023-06-17 LAB — BENZODIAZEPINES,MS,WB/SP RFX
7-Aminoclonazepam: NEGATIVE ng/mL
Alprazolam: NEGATIVE ng/mL
Benzodiazepines Confirm: POSITIVE
Chlordiazepoxide: NEGATIVE
Clonazepam: NEGATIVE ng/mL
Desalkylflurazepam: NEGATIVE ng/mL
Desmethylchlordiazepoxide: NEGATIVE
Desmethyldiazepam: 19 ng/mL
Diazepam: 31 ng/mL
Flurazepam: NEGATIVE ng/mL
Lorazepam: NEGATIVE ng/mL
Midazolam: NEGATIVE ng/mL
Oxazepam: NEGATIVE ng/mL
Temazepam: NEGATIVE ng/mL
Triazolam: NEGATIVE ng/mL

## 2023-06-17 LAB — DRUG SCREEN 10 W/CONF, SERUM
Amphetamines, IA: NEGATIVE ng/mL
Barbiturates, IA: NEGATIVE ug/mL
Benzodiazepines, IA: POSITIVE ng/mL — AB
Cocaine & Metabolite, IA: NEGATIVE ng/mL
Methadone, IA: NEGATIVE ng/mL
Opiates, IA: NEGATIVE ng/mL
Oxycodones, IA: NEGATIVE ng/mL
Phencyclidine, IA: NEGATIVE ng/mL
Propoxyphene, IA: NEGATIVE ng/mL
THC(Marijuana) Metabolite, IA: NEGATIVE ng/mL

## 2023-06-17 NOTE — Telephone Encounter (Signed)
Called patient's guardian at 8:46 am and left voicemail for [facility] for call back.    Anette Guarneri, PT, DPT 06/17/23 11:48 AM  [facility] [address] [place], [address] [place], [place] [address] Phone # [phone] Fax # [phone]

## 2023-06-20 ENCOUNTER — Telehealth: Payer: Self-pay | Admitting: Physical Therapy

## 2023-06-20 NOTE — Telephone Encounter (Signed)
Spoke to patient's Guardian [name]) who reports that she spoke to the patient and he is willing to resume and participate in outpatient therapies. We agreed on 1x/week for 6 weeks. Encouraged [name] to be present at pt's appointments for hopeful improvement in participation and she was agreeable. Will have the office call Tabitha at group home for scheduling.    Anette Guarneri, PT, DPT 06/20/23 4:34 PM  [facility] at [facility] [address] [place], [address] [place], [place] [address] Phone # [phone] Fax # [phone]

## 2023-06-26 ENCOUNTER — Ambulatory Visit (INDEPENDENT_AMBULATORY_CARE_PROVIDER_SITE_OTHER): Payer: Medicare Other | Admitting: Neurology

## 2023-06-26 ENCOUNTER — Encounter: Payer: Self-pay | Admitting: Neurology

## 2023-06-26 VITALS — BP 133/62 | HR 105 | Ht 63.5 in | Wt 145.0 lb

## 2023-06-26 DIAGNOSIS — I69954 Hemiplegia and hemiparesis following unspecified cerebrovascular disease affecting left non-dominant side: Secondary | ICD-10-CM | POA: Diagnosis not present

## 2023-06-26 DIAGNOSIS — Z79899 Other long term (current) drug therapy: Secondary | ICD-10-CM

## 2023-06-26 DIAGNOSIS — G40909 Epilepsy, unspecified, not intractable, without status epilepticus: Secondary | ICD-10-CM

## 2023-06-26 DIAGNOSIS — Z9889 Other specified postprocedural states: Secondary | ICD-10-CM | POA: Diagnosis not present

## 2023-06-26 NOTE — Progress Notes (Signed)
Chief Complaint  Patient presents with   Follow-up    RM 14, poa [name] sister present , HOSPITAL FOLLOW ZO:XWRUEA TO COMPLETE MEMORY TEST HARD TO UNDERSTAND, last episode of sz like activity was 06/04/23      ASSESSMENT AND PLAN  [name] is [name] 67 y.o. 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 Schizophrenia Polypharmacy treatment  MRI of the brain showed large right frontal encephalomalacia, no acute abnormality, most recent MRI was on June 05, 2023,  There are frequent hospital admission for increased confusion, unsteady gait, possible seizure, frequent adjustment of his medications, not back to baseline Depakote DR 500 twice [name] day, lamotrigine 100 mg once [name] day, lower dose of Seroquel 200 mg twice [name] day, he is overall more alert, no recurrent spells since change on June 06, 2023,  Discussed with his sister, we will keep him at current medications, will not adjust his antiepileptic medications based on medication level alone, call clinic for recurrent spells  Return To Clinic With NP In 3 Months   DIAGNOSTIC DATA (LABS, IMAGING, TESTING) - I reviewed patient records, labs, notes, testing and imaging myself where available.  CT head without contrast from [facility] in April 2019: Encephalomalacia of right frontal lobe with small vessel disease in the superior right centrum semiovale, stable parietal craniotomy of the right and left superior frontal region,  Echocardiogram December 2021: Ejection fraction 65 to 70%  EEG October 21, 2020, mild to moderate diffuse encephalopathy, no epileptiform discharge   HISTORICAL data  [name] [name] 67 year old male, seen in request by his primary care PA [name], Gerald A, PA for evaluation of passing out spells, he is accompanied by his sister [name], who is also his  power of attorney at today's visit on March 09, 2021.  I reviewed and summarized the referring note.  Past medical history Stroke with residual left side weakness, Seizure, Intellectual delay Dysphagia, Barrett's esophagus, GERD with gastroparesis, Hypothyroidism, on supplement Hypertension Hyperlipidemia Schizophrenia Diabetes History of Craniotomy,   Patient had [name] history of craniotomy for benign brain tumor removal in 07-08-03 at [facility], postsurgically, he developed complications, required [name] second surgery, which is further complicated by stroke, with residual left-sided weakness.  The history is from his sister, I could not find the medical records through epic system  Patient also had lifelong history of schizophrenia, developmentally delayed, intellectual disability, lived with his father, who passed away in 2007-07-08, eventually he was placed at [name] group home since July 08, 2011,  He began to have seizure following his craniotomy in 2003-07-08, was managed by outside neurologist, has been seizure-free for more than 10 years, stable on current medication of Depakote DR 500 mg twice [name] day, and lamotrigine 100 mg daily  In September 2021, he was found to be confused, altered mental status, hypotension, that was improved with hospital admission, hydration, hospital admission again in November 2021, for aspiration pneumonia, 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, periesophagus hernia, underwent robotic assistant laparoscopy, paraesophageal hernia repair, followed by prolonged rehabilitation,  His condition has gradually stabilized since, during the process, he was noted to have confusion, near syncope episode, fall at nursing home, there was no clinical seizure activity noted  Since the surgery, he has much improved, almost back to his baseline, he has unsteady gait, carry on simple conversation, has good appetite, residual left hemiparesis  UPDATE Oct 26 2021: He is accompanied by  his group home staff [name] at today's visit, who has known patient for 1 year, patient is overall doing well, has back to his baseline, have good appetite, sleeping well, continue have language difficulty, gait abnormality, no seizure-like activity  UPDate April 01 2023: He was accompanied by group home staff [name] at visit, who has known him for 2 months, described his difficulty using left side, which was actually present at previous examination, I tried to call his sister, power of attorney [name] without success,  Personally reviewed MRI of the brain September 2022, no acute abnormality, large cystic encephalomalacia at the right frontal lobe,  I reviewed emergency room visit on March 22, 2023,  he was taken to the emergency room for weakness, difficulty using his left side, sister is guardian, filed [name] complaining of non accidental trauma at [name] group home, Depakote level was low 43,,    Lab from March 15, 2023, Depakote level was 43, lamotrigine was 8.4, normal thyroid functional test, CBC, CMP,  UPDATE April 23 2023: He is accompanied by group home staff at today's visit, hospital admission on April 22 for worsening left-sided weakness, confusion suspicious for seizure, Depakote was increased from 500 to 750 mg twice [name] day  Personally reviewed MRI of the brain April 10, 2023, no acute abnormality, resection cavity with surrounding encephalomalacia gliosis at the right frontal lobe  CT angiogram of head and neck showed no large vessel disease  Laboratory evaluations, normal CBC, hemoglobin of  15.4, BMP, calcium of 8.6  Overnight EEG with video April 17 continues 3 to 6 Hz theta delta slowing at the right frontal region, but no epileptiform discharge  Laboratory evaluations in April 2024 normal TSH RPR B12 folic acid B1, A1c HIV, lamotrigine level was 10, Depakote 80  UPDATE July 2024: Hospital admission again in June 04, 2023, group home reported slurred speech, sister  also described IV rolling backwards, transient unresponsiveness, confused for 3 days  Laboratory showed worsening creatinine 1.6 from baseline of 1.13, ammonia level was normal, personally reviewed MRI, right frontal encephalomalacia, no acute abnormality UDS was positive for benzodiazepine, confirmed by blood test, for his [place], there was no record of benzodiazepine use in his medical record  Lab also showed slight elevation of TSH 5.3, lamotrigine level 8.1, Depakote level 49,  Seroquel was decreased from 400 to 200mg  bid since hospital discharge on June 13, remained on Depakote DR 500 mg twice [name] day lamotrigine 100 mg once [name] day  He is overall doing better, more alert with the lower dose of Seroquel, no confusion /passing out spell,  Over the past few months, he was noted to have increased gait abnormality, dragging left leg more  Personally reviewed MRI of the brain June 05, 2023, no acute abnormality, right frontal encephalomalacia, moderate small vessel disease  PHYSICAL EXAM   Vitals:   06/26/23 1543  BP: 133/62  Pulse: (!) 105  Weight: 145 lb (65.8 kg)  Height: 5' 3.5" (1.613 m)   Body mass index is 25.28 kg/m.  PHYSICAL EXAMNIATION:  Gen: NAD, conversant, well nourised, well groomed         NEUROLOGICAL EXAM:  MENTAL STATUS: Speech/cognition: Dysarthria, cooperative on examination, following simple command, rely on system for history,     CRANIAL NERVES: CN II: Visual fields are full to confrontation. Pupils are round equal and briskly reactive to light. CN III, IV, VI: extraocular movement are normal. No ptosis.  CN V: Facial sensation is intact to light touch CN VII: Face is symmetric with normal eye closure  CN VIII: Hearing is normal to causal conversation. CN IX, X: Phonation is normal. CN XI: Head turning and shoulder shrug are intact  MOTOR: Spastic left hemiparesis, antigravity movement of left upper extremity, barely able to move left lower  extremity proximal and distal muscles antigravity  REFLEXES: Hyperreflexia on the left side  SENSORY: Withdraws to pain COORDINATION: There is no trunk or limb dysmetria noted.  GAIT/STANCE: Deferred  REVIEW OF SYSTEMS: Full 14 system review of systems performed and notable only for as above All other review of systems were negative.  ALLERGIES: Allergies  Allergen Reactions   Vancomycin Itching and Other (See Comments)    Unknown-possible hives      HOME MEDICATIONS: Current Outpatient Medications  Medication Sig Dispense Refill   Cholecalciferol (VITAMIN D) 50 MCG (2000 UT) tablet Take 1 tablet (2,000 Units total) by mouth daily. 30 tablet 0   divalproex (DEPAKOTE) 500 MG DR tablet Take 1 tablet (500 mg total) by mouth every 12 (twelve) hours. 60 tablet 0   docusate sodium (COLACE) 100 MG capsule Take 1 capsule (100 mg total) by mouth 2 (two) times daily. 10 capsule 0   Emollient (CORN HUSKERS EX) Apply 1 application topically in the morning and at bedtime. For dry skin     lamoTRIgine (LAMICTAL) 100 MG tablet Take 1 tablet (100 mg total) by mouth at bedtime. 90 tablet 3   levothyroxine (SYNTHROID) 175 MCG tablet Take 175 mcg by mouth daily.     metoprolol tartrate (LOPRESSOR) 25 MG tablet Take 0.5 tablets (12.5 mg total) by mouth 2 (two) times daily. 30 tablet 0   pantoprazole (PROTONIX) 40 MG tablet Take 1 tablet (40 mg total) by mouth 2 (two) times daily before [name] meal. 60 tablet 0   polyethylene glycol (MIRALAX / GLYCOLAX) 17 g packet Take 17 g by mouth 2 (two) times daily. 14 each 0   rosuvastatin (CRESTOR) 20 MG tablet Take 1 tablet (20 mg total) by mouth daily. 30 tablet 0   Acetylcysteine (NAC) 500 MG CAPS Take 1 capsule by mouth 2 (two) times daily.     benazepril (LOTENSIN) 5 MG tablet Take 5 mg by mouth daily.     QUEtiapine (SEROQUEL) 400 MG tablet Take 1 tablet (400 mg total) by mouth 2 (two) times daily. 60 tablet 0   No current facility-administered medications  for this visit.    PAST MEDICAL HISTORY: Past Medical History:  Diagnosis Date   Barrett esophagus    DM II (diabetes mellitus, type II), controlled (HCC) 11/13/2020   Dysphagia    Gastroparesis    GERD (gastroesophageal reflux disease)    Hypercholesteremia    Hypertension    Iron deficiency anemia    Mental retardation    Schizophrenia (HCC)    Seizure (HCC)    Stroke [facility])     PAST SURGICAL HISTORY: Past Surgical History:  Procedure Laterality Date   BIOPSY  10/21/2020   Procedure: BIOPSY;  Surgeon: Iva Boop, MD;  Location: [facility] ENDOSCOPY;  Service: Endoscopy;;   CRANIOTOMY FOR CYST FENESTRATION     ESOPHAGOGASTRODUODENOSCOPY N/[name] 01/09/2015   Procedure: ESOPHAGOGASTRODUODENOSCOPY (EGD);  Surgeon: Louis Meckel, MD;  Location: [facility] ENDOSCOPY;  Service: Endoscopy;  Laterality: N/[name];   ESOPHAGOGASTRODUODENOSCOPY N/[name] 10/26/2020   Procedure: ESOPHAGOGASTRODUODENOSCOPY (EGD);  Surgeon: Corliss Skains, MD;  Location: [facility] OR;  Service: Thoracic;  Laterality: N/[name];  ESOPHAGOGASTRODUODENOSCOPY (EGD) WITH PROPOFOL N/[name] 10/21/2020   Procedure: ESOPHAGOGASTRODUODENOSCOPY (EGD) WITH PROPOFOL;  Surgeon: Iva Boop, MD;  Location: [facility] ENDOSCOPY;  Service: Endoscopy;  Laterality: N/[name];   IR GASTR TUBE CONVERT GASTR-JEJ PER W/FL MOD SED  11/07/2020   PEG PLACEMENT N/[name] 10/26/2020   Procedure: PERCUTANEOUS ENDOSCOPIC GASTROSTOMY (PEG) PLACEMENT;  Surgeon: Corliss Skains, MD;  Location: [facility] OR;  Service: Thoracic;  Laterality: N/[name];   XI ROBOTIC ASSISTED HIATAL HERNIA REPAIR N/[name] 10/26/2020   Procedure: XI ROBOTIC ASSISTED HIATAL HERNIA REPAIR;  Surgeon: Corliss Skains, MD;  Location: [facility] OR;  Service: Thoracic;  Laterality: N/[name];    FAMILY HISTORY: Family History  Problem Relation Age of Onset   Breast cancer Mother    Heart disease Father     SOCIAL HISTORY: Social History   Socioeconomic History   Marital status: Single    Spouse name: Not on file   Number of  children: 0   Years of education: 12   Highest education level: High school graduate  Occupational History   Occupation: Disabled  Tobacco Use   Smoking status: Never   Smokeless tobacco: Never  Vaping Use   Vaping Use: Never used  Substance and Sexual Activity   Alcohol use: Not Currently   Drug use: Never   Sexual activity: Not Currently  Other Topics Concern   Not on file  Social History Narrative   Lives at [facility] group home (ph: [phone]).   Left-handed.   No daily caffeine use.   Uses wheel chair but working w/pt and ot to get back with walker 06/26/23   18kcal heart healthy diet thin liquid aspiration precautions meds whole in applesauce    Social Determinants of Health   Financial Resource Strain: Not on file  Food Insecurity: No Food Insecurity (06/05/2023)   Hunger Vital Sign    Worried About Running Out of Food in the Last Year: Never true    Ran Out of Food in the Last Year: Never true  Transportation Needs: No Transportation Needs (06/05/2023)   PRAPARE - Administrator, Civil Service (Medical): No    Lack of Transportation (Non-Medical): No  Physical Activity: Not on file  Stress: Not on file  Social Connections: Not on file  Intimate Partner Violence: Not At Risk (06/05/2023)   Humiliation, Afraid, Rape, and Kick questionnaire    Fear of Current or Ex-Partner: No    Emotionally Abused: No    Physically Abused: No    Sexually Abused: No      Levert Feinstein, M.D. Ph.D.  [facility] [address], [address] [place], [place] [address] Ph: [phone] Fax: [phone]  CC:  Lucretia Field [address] [place],  [place] [address]  Lucretia Field   Total time spent reviewing the chart, obtaining history, examined patient, ordering tests, documentation, consultations and family, care coordination was 45 minutes

## 2023-06-26 NOTE — Patient Instructions (Signed)
Patient is now  taking Seroquel 200mg  twice a day Depakote DR 500mg  twice a day Lamotrigine 100mg  daily.  If patient does not show signs of medication toxicity such as excessive drowsiness, dizziness, unsteady gait, do not need to adjust antiepileptic medication dosage based on blood level alone,  Please call neurological office [phone] for spells suspicious for seizures,

## 2023-07-02 NOTE — Therapy (Signed)
OUTPATIENT OCCUPATIONAL THERAPY NEURO  Treatment Note   Patient Name: [name] MRN: [id number] DOB:11/08/1956, 67 y.o., male Today's Date: 06/12/2023  PCP: Lucretia Field REFERRING PROVIDER: Rolly Salter, MD  NOTE: Plan was to pursue home health to increase pt participation, however pt's group home representative reports that he cannot get home health.  Pt's POA (his sister) would like for pt to continue with therapy services in this clinic.  Plan to re-cert at next scheduled visit.    Rosalio Loud, OT 07/02/23    END OF SESSION:  OT End of Session - 06/12/23 1320     Visit Number 7    Number of Visits 7    Date for OT Re-Evaluation 06/12/23    Authorization Type Medicare A&B    OT Start Time 1325   pt in bathroom at start of session   OT Stop Time 1346    OT Time Calculation (min) 21 min    Activity Tolerance Patient tolerated treatment well;No increased pain;Patient limited by fatigue    Behavior During Therapy Flat affect;WFL for tasks assessed/performed;Impulsive                Past Medical History:  Diagnosis Date   Barrett esophagus    DM II (diabetes mellitus, type II), controlled (HCC) 11/13/2020   Dysphagia    Gastroparesis    GERD (gastroesophageal reflux disease)    Hypercholesteremia    Hypertension    Iron deficiency anemia    Mental retardation    Schizophrenia (HCC)    Seizure (HCC)    Stroke [facility])    Past Surgical History:  Procedure Laterality Date   BIOPSY  10/21/2020   Procedure: BIOPSY;  Surgeon: Iva Boop, MD;  Location: [facility] ENDOSCOPY;  Service: Endoscopy;;   CRANIOTOMY FOR CYST FENESTRATION     ESOPHAGOGASTRODUODENOSCOPY N/A 01/09/2015   Procedure: ESOPHAGOGASTRODUODENOSCOPY (EGD);  Surgeon: Louis Meckel, MD;  Location: [facility] ENDOSCOPY;  Service: Endoscopy;  Laterality: N/A;   ESOPHAGOGASTRODUODENOSCOPY N/A 10/26/2020   Procedure: ESOPHAGOGASTRODUODENOSCOPY (EGD);  Surgeon: Corliss Skains, MD;  Location: [facility]  OR;  Service: Thoracic;  Laterality: N/A;   ESOPHAGOGASTRODUODENOSCOPY (EGD) WITH PROPOFOL N/A 10/21/2020   Procedure: ESOPHAGOGASTRODUODENOSCOPY (EGD) WITH PROPOFOL;  Surgeon: Iva Boop, MD;  Location: [facility] ENDOSCOPY;  Service: Endoscopy;  Laterality: N/A;   IR GASTR TUBE CONVERT GASTR-JEJ PER W/FL MOD SED  11/07/2020   PEG PLACEMENT N/A 10/26/2020   Procedure: PERCUTANEOUS ENDOSCOPIC GASTROSTOMY (PEG) PLACEMENT;  Surgeon: Corliss Skains, MD;  Location: [facility] OR;  Service: Thoracic;  Laterality: N/A;   XI ROBOTIC ASSISTED HIATAL HERNIA REPAIR N/A 10/26/2020   Procedure: XI ROBOTIC ASSISTED HIATAL HERNIA REPAIR;  Surgeon: Corliss Skains, MD;  Location: [facility] OR;  Service: Thoracic;  Laterality: N/A;   Patient Active Problem List   Diagnosis Date Noted   Stroke-like symptoms 06/05/2023   Mixed hyperlipidemia 05/26/2023   Intellectual developmental delay 05/26/2023   General weakness 04/09/2023   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) 10/26/2021   Seizure disorder (HCC) 10/26/2021   S/P craniotomy 03/09/2021   Dilation of esophagus    Hyponatremia    Dysphagia    Seizures (HCC)    S/P percutaneous endoscopic gastrostomy (PEG) tube placement (HCC)    Weakness 11/15/2020   Bowel obstruction (HCC)    S/P repair of paraesophageal hernia 10/26/2020   Syncope 10/21/2020   Hiatal hernia 10/21/2020   Acute hypoxemic respiratory failure (HCC) 10/21/2020  Sinus tachycardia 10/21/2020   Gastric polyps    Altered mental status 09/05/2020   Emesis 07/24/2015   Coffee ground emesis    Gastrointestinal hemorrhage associated with other gastritis    Acute esophagitis 01/09/2015   Upper gastrointestinal bleed    Diarrhea    Nausea with vomiting    GI bleed 01/08/2015   GIB (gastrointestinal bleeding) 01/08/2015   Nausea & vomiting 01/08/2015   Reflux esophagitis 07/04/2009   Loss of weight 07/04/2009   BLOOD IN STOOL, OCCULT  07/04/2009   ANEMIA, IRON DEFICIENCY 05/04/2008   Hypothyroidism 04/05/2008   Schizophrenia, unspecified type (HCC) 04/05/2008   Depression 04/05/2008   Hemiplegia (HCC) 04/05/2008   Essential hypertension 04/05/2008   GERD 04/05/2008   Gastroparesis 04/05/2008   Acute metabolic encephalopathy 04/05/2008   History of diverticulosis 08/21/2004   Barrett's esophagus 04/01/2002   HIATAL HERNIA 04/01/2002    ONSET DATE: 04/09/23  REFERRING DIAG: R53.1 (ICD-10-CM) - Left-sided weakness G40.919 (ICD-10-CM) - Breakthrough seizure  THERAPY DIAG:  Hemiplegia and hemiparesis following cerebral infarction affecting left non-dominant side (HCC)  Other lack of coordination  Muscle weakness (generalized)  Unsteadiness on feet  Rationale for Evaluation and Treatment: Rehabilitation  PERTINENT HISTORY: PMH: GERD, Barrett esophagus, gastroparesis, HTN, CVA (large R frontal) with L sided deficits, mental retardation, schizophrenia, hypothyroidism, seizures, benign intracranial tumor s/p craniotomy 2004, dysphagia, HLD "Pt states that his LUE is weak.  Pt reports that he uses a walker and w/c for mobility.  Pt with inconsistent report on PLOF and current status, therefore caregiver present for portion of session to address current status and PLOF.  Pt's caregiver reports that pt's strength is weaker on the L side and that his cognition is different.  Caregiver reports that pt is w/c level since hospitalization and requires +2 for all transfers and self-care tasks."  PRECAUTIONS: Other: seizures;  WEIGHT BEARING RESTRICTIONS: No   SUBJECTIVE:   SUBJECTIVE STATEMENT: Pt's caregiver reports no changes in function s/p hospitalization.    Pt accompanied by: self and caregiver from group home   PAIN:  Are you having pain? No   FALLS: Has patient fallen in last 6 months? No  LIVING ENVIRONMENT: Lives with: lives in an adult home Lives in: House/apartment Stairs: No Has following equipment  at home: Dan Humphreys - 4 wheeled, Wheelchair (power), shower chair, and Grab bars  PLOF:  Mod I for toileting, but Min-mod assist for ADLs.  Utilized Rollator for mobility prior to most recent hospitalization  PATIENT GOALS: to have a long life   OBJECTIVE: (All objective assessments below are from initial evaluation on: 04/25/23 unless otherwise specified.)   HAND DOMINANCE: Right  ADLs: Overall ADLs: +2 assist for all mobility and self-care tasks. +2 for transfers into shower, utilizing shower chair Transfers/ambulation related to ADLs: +2 assist Eating: setup to open containers, but then pt able to feed himself Grooming: total assist UB Dressing: total assist LB Dressing: +2 assist Toileting: +2 assist Bathing: +2 assist Tub Shower transfers: +2 assist, handicap accessible shower with seat Equipment: Shower seat with back and Walk in shower   MOBILITY STATUS:  utilizing w/c at this point for mobility  POSTURE COMMENTS:  rounded shoulders, forward head, and posterior pelvic tilt   UPPER EXTREMITY ROM:  AROM: Pt initially able to lift arm ~20-30% from arm rest, utilizing abduction to achieve ~60* shoulder flexion; however when directed to move to assess ROM pt stating no.  OT attempted to explain purpose of therapy and evaluation to assess  ROM, however pt continuing to shake head and state no to further attempts at AROM.  Did require max encouragement to assess PROM.  Passive ROM Left eval  Shoulder flexion 30*  Shoulder abduction 80*  Shoulder adduction   Shoulder extension   Shoulder internal rotation   Shoulder external rotation   Elbow flexion WNL  Elbow extension WNL  Wrist flexion 50%  Wrist extension 25%  Wrist ulnar deviation   Wrist radial deviation   Wrist pronation   Wrist supination   (Blank rows = not tested)   HAND FUNCTION: Unable to assess as pt keeping hand in partial fist.  Pt is able to open digits fully, except for thumb, with increased time and  demonstration  COORDINATION: Pt would not attempt to pick any items up with L hand despite multiple attempts and multiple items  SENSATION: Inconsistent report.  Pt reports both hands feel the same, but also reports numbness on L side since stroke  COGNITION: Overall cognitive status: History of cognitive impairments - at baseline  VISION: Subjective report: unable to assess due to inconsistent report, cognition impacting ability to attend and participate   OBSERVATIONS: Pt with inconsistent report and frequently shaking head when therapist encouraging attempts at AROM for LUE.  Pt did lift LUE spontaneously when attempting to reach towards RW during PT session (OT present as +2 initially).     TODAY'S TREATMENT:                                                                       07/03/23: ***  06/12/23 Attempted to engage pt in various bimanual tasks incorporating leisure tasks with "playing ball" and fishing.  Pt initially engaging with picking up items with L hand, however after 5 with increased effort pt then stating "I don't want to do this anymore".  OT reviewed progress towards goals with pt and caregiver.  Pt has made progress towards many goals, improving from +2 to Min A with transfers and most dressing tasks.  Pt does continue to require max assist for bathing when in standing.  OT educated on use of shower chair for increased safety and independence as pt able to reach towards feet and cross leg into figure 4 position to aide in LB bathing.  Pt initially expressing desire to work on LUE to return to fishing, however unable to convince pt to further engage in any structured tasks.  Discussed with pt and caregiver plan to recert vs d/c.  After brief discussion with PT, plan to pursue home health therapies to allow for therapy in home for increased engagement and participation as well as further education on safety for caregivers.  Caregiver and pt in agreement.   05/29/23 Transfers:  engaged in stand pivot and sit <> stand transfers with min assist.  Pt requiring increased time to initiate movement but able to complete when provided with time and encouragement.  Pt demonstrating decreased placement of LLE when in standing, by placing it slightly in front but able to maintain static and simple dynamic standing balance with close supervision - CGA. LB dressing: simulated LB dressing with use of hula hoop with pt able to lift RLE without issues and increased effort to lift LLE to advance hula  hoop over feet.  Pt refused to stand therefore engaged in massed practice with advance hula hoop back and forth under feet.  OT educated on use of sock aid with demonstration.  However pt able to bring RLE into figure 4 position and able to don sock with hemi-technique to open sock and then pull up.  Pt unable to achieve figure 4 with LLE, therefore OT educated on modified circle sitting at EOB to allow for increased ability to reach L foot.  Pt achieving position but then refusing to attempt to don/doff L shoe/sock. Dynamic standing: engaged in standing task with placing pieces into Connect 4 grid, initially focused on use of LUE however pt refusing attempts with LUE therefore focused on maintaining standing balance during structured task.   LUE reaching: engaged in reaching and gross grasp to pick up bean bags with L hand and drop in basket on L side.  Pt initially attempting with BUE, due to difficulty completing only with RUE.  OT then transitioning to providing cues to open L hand and then pt able to pinch between thumb, index, and long finger to sustain gross grasp and then release.  OT educated on functional carryover of gross grasp to ADLs.    05/22/23 UB dressing: OT demonstrating with use of gait belt and providing with handout for hemi-dressing technique.  Pt attempting to complete with gait belt to simulate dressing, however with decreased carryover and rushing through steps causing pt to  get caught up in gait belt.  OT provided pt with XL t-shirt with pt demonstrating improved sequencing and slowing down to allow for improved independence with donning shift.  Provided handout for caregiver as new caregivers are onboarding at pt's group home.   LUE NMR: engaged in ball toss with dowel in BUE to bat back to therapist.  Pt benefiting from hand over hand on L side to facilitate increased ROM.      PATIENT EDUCATION: Education details: ongoing condition specific education. Person educated: Patient Education method: Explanation Education comprehension: needs further education  HOME EXERCISE PROGRAM: Self-ROM (see pt instructions)  Access Code: [email or web address] URL: [email or web address] Date: 05/15/2023 Prepared by: [facility] - [facility]   GOALS: Goals reviewed with patient? Yes  SHORT TERM GOALS: Target date: 05/17/23  Pt and caregiver will be independent with simple HEP and functional activities to encourage simple assist from LUE and to increase awareness/attention of LUE. Baseline: Goal status: MET - 05/15/23  2.  Pt will tolerate standing with supervision to allow caregiver to complete clothing management/hygiene for toileting. Baseline:  Goal status: IN PROGRESS  3.  Pt will be able to complete UB dressing with min assist and min cues for hemi-technique. Baseline:  Goal status: MET - 05/22/23   LONG TERM GOALS: Target date: 06/07/23  Pt will complete toilet transfer, stand pivot, with min assist of one caregiver. Baseline:  Goal status: MET - 06/12/23  2.  Pt will assist with bathing to complete 50%of task, incorporating use of LUE at gross assist level and with use of AE prn  Baseline:  Goal status: NOT MET - requires significant assistance for balance and engagement in task  3.  Pt will complete clothing management post toileting with min assist of one caregiver for safety and sequencing. Baseline:  Goal status: MET -  CGA on 06/12/23  4.  Pt will be able to complete LB dressing with mod assist and min cues for hemi-technique. Baseline:  Goal status: MET -  Min A on 06/12/23   ASSESSMENT:  CLINICAL IMPRESSION: 07/03/23: ***  06/12/23: Pt with limited participation in structured tasks this session.  Reviewed safety recommendations and use of shower chair for home for decreased burden of care and safety with bathing. Pt continues to have decreased "buy in" with tasks but is able to complete many ADLs at improved level from evaluation.  Pt has progressed from +2 to min assist for sit > stand and transfers as well as min-mod assist for self-care tasks.  After discussion with PT and caregiver, plan to pursue home health therapies for increased pt participation and recommendations for decreasing burden of care.  PLAN:  OT FREQUENCY: 1x/week  OT DURATION: 6 weeks  PLANNED INTERVENTIONS: self care/ADL training, therapeutic exercise, therapeutic activity, neuromuscular re-education, manual therapy, passive range of motion, balance training, functional mobility training, splinting, ultrasound, paraffin, fluidotherapy, compression bandaging, moist heat, cryotherapy, patient/family education, cognitive remediation/compensation, visual/perceptual remediation/compensation, psychosocial skills training, coping strategies training, and DME and/or AE instructions  RECOMMENDED OTHER SERVICES: SLP eval  CONSULTED AND AGREED WITH PLAN OF CARE: Patient  PLAN FOR NEXT SESSIONRosalio Loud, OTR/L 06/12/2023, 2:18 PM

## 2023-07-03 ENCOUNTER — Ambulatory Visit: Payer: Medicare Other | Admitting: Rehabilitative and Restorative Service Providers"

## 2023-07-03 ENCOUNTER — Encounter: Payer: Self-pay | Admitting: Physical Therapy

## 2023-07-03 ENCOUNTER — Encounter: Payer: Self-pay | Admitting: Rehabilitative and Restorative Service Providers"

## 2023-07-03 ENCOUNTER — Ambulatory Visit: Payer: Medicare Other | Attending: Internal Medicine | Admitting: Physical Therapy

## 2023-07-03 DIAGNOSIS — R208 Other disturbances of skin sensation: Secondary | ICD-10-CM | POA: Diagnosis present

## 2023-07-03 DIAGNOSIS — M6281 Muscle weakness (generalized): Secondary | ICD-10-CM | POA: Insufficient documentation

## 2023-07-03 DIAGNOSIS — R278 Other lack of coordination: Secondary | ICD-10-CM

## 2023-07-03 DIAGNOSIS — I69354 Hemiplegia and hemiparesis following cerebral infarction affecting left non-dominant side: Secondary | ICD-10-CM | POA: Insufficient documentation

## 2023-07-03 DIAGNOSIS — R41840 Attention and concentration deficit: Secondary | ICD-10-CM | POA: Insufficient documentation

## 2023-07-03 DIAGNOSIS — R2681 Unsteadiness on feet: Secondary | ICD-10-CM | POA: Insufficient documentation

## 2023-07-03 DIAGNOSIS — R293 Abnormal posture: Secondary | ICD-10-CM | POA: Diagnosis present

## 2023-07-03 DIAGNOSIS — R2689 Other abnormalities of gait and mobility: Secondary | ICD-10-CM | POA: Insufficient documentation

## 2023-07-03 NOTE — Therapy (Signed)
OUTPATIENT PHYSICAL THERAPY NEURO TREATMENT/PROGRESS NOTE/RECERT  Patient Name: [name] MRN: [id number] DOB:October 01, 1956, 67 y.o., male Today's Date: 07/04/2023  PCP: Lucretia Field REFERRING PROVIDER: Rolly Salter, MD  Progress Note Reporting Period 04/25/2023 to 07/03/2023  See note below for Objective Data and Assessment of Progress/Goals.      END OF SESSION:  PT End of Session - 07/03/23 1535     Visit Number 7    Number of Visits 12    Date for PT Re-Evaluation 08/09/23    Authorization Type Medicare/Medicaid    PT Start Time 1535    PT Stop Time 1615    PT Time Calculation (min) 40 min    Equipment Utilized During Treatment Gait belt    Activity Tolerance Patient tolerated treatment well    Behavior During Therapy Impulsive            Past Medical History:  Diagnosis Date   Barrett esophagus    DM II (diabetes mellitus, type II), controlled (HCC) 11/13/2020   Dysphagia    Gastroparesis    GERD (gastroesophageal reflux disease)    Hypercholesteremia    Hypertension    Iron deficiency anemia    Mental retardation    Schizophrenia (HCC)    Seizure (HCC)    Stroke [facility])    Past Surgical History:  Procedure Laterality Date   BIOPSY  10/21/2020   Procedure: BIOPSY;  Surgeon: Iva Boop, MD;  Location: [facility] ENDOSCOPY;  Service: Endoscopy;;   CRANIOTOMY FOR CYST FENESTRATION     ESOPHAGOGASTRODUODENOSCOPY N/A 01/09/2015   Procedure: ESOPHAGOGASTRODUODENOSCOPY (EGD);  Surgeon: Louis Meckel, MD;  Location: [facility] ENDOSCOPY;  Service: Endoscopy;  Laterality: N/A;   ESOPHAGOGASTRODUODENOSCOPY N/A 10/26/2020   Procedure: ESOPHAGOGASTRODUODENOSCOPY (EGD);  Surgeon: Corliss Skains, MD;  Location: [facility] OR;  Service: Thoracic;  Laterality: N/A;   ESOPHAGOGASTRODUODENOSCOPY (EGD) WITH PROPOFOL N/A 10/21/2020   Procedure: ESOPHAGOGASTRODUODENOSCOPY (EGD) WITH PROPOFOL;  Surgeon: Iva Boop, MD;  Location: [facility] ENDOSCOPY;  Service: Endoscopy;   Laterality: N/A;   IR GASTR TUBE CONVERT GASTR-JEJ PER W/FL MOD SED  11/07/2020   PEG PLACEMENT N/A 10/26/2020   Procedure: PERCUTANEOUS ENDOSCOPIC GASTROSTOMY (PEG) PLACEMENT;  Surgeon: Corliss Skains, MD;  Location: [facility] OR;  Service: Thoracic;  Laterality: N/A;   XI ROBOTIC ASSISTED HIATAL HERNIA REPAIR N/A 10/26/2020   Procedure: XI ROBOTIC ASSISTED HIATAL HERNIA REPAIR;  Surgeon: Corliss Skains, MD;  Location: [facility] OR;  Service: Thoracic;  Laterality: N/A;   Patient Active Problem List   Diagnosis Date Noted   Polypharmacy 06/26/2023   Stroke-like symptoms 06/05/2023   Mixed hyperlipidemia 05/26/2023   Intellectual developmental delay 05/26/2023   General weakness 04/09/2023   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) 10/26/2021   Seizure disorder (HCC) 10/26/2021   S/P craniotomy 03/09/2021   Dilation of esophagus    Hyponatremia    Dysphagia    Seizures (HCC)    S/P percutaneous endoscopic gastrostomy (PEG) tube placement (HCC)    Weakness 11/15/2020   Bowel obstruction (HCC)    S/P repair of paraesophageal hernia 10/26/2020   Syncope 10/21/2020   Hiatal hernia 10/21/2020   Acute hypoxemic respiratory failure (HCC) 10/21/2020   Sinus tachycardia 10/21/2020   Gastric polyps    Altered mental status 09/05/2020   Emesis 07/24/2015   Coffee ground emesis    Gastrointestinal hemorrhage associated with other gastritis    Acute esophagitis 01/09/2015   Upper gastrointestinal bleed  Diarrhea    Nausea with vomiting    GI bleed 01/08/2015   GIB (gastrointestinal bleeding) 01/08/2015   Nausea & vomiting 01/08/2015   Reflux esophagitis 07/04/2009   Loss of weight 07/04/2009   BLOOD IN STOOL, OCCULT 07/04/2009   ANEMIA, IRON DEFICIENCY 05/04/2008   Hypothyroidism 04/05/2008   Schizophrenia, unspecified type (HCC) 04/05/2008   Depression 04/05/2008   Hemiplegia (HCC) 04/05/2008   Essential hypertension 04/05/2008    GERD 04/05/2008   Gastroparesis 04/05/2008   Acute metabolic encephalopathy 04/05/2008   History of diverticulosis 08/21/2004   Barrett's esophagus 04/01/2002   HIATAL HERNIA 04/01/2002    ONSET DATE: 04/09/23 REFERRING DIAG: R53.1 (ICD-10-CM) - Left-sided weakness G40.919 (ICD-10-CM) - Breakthrough seizure (HCC) THERAPY DIAG:  Unsteadiness on feet  Muscle weakness (generalized)  Other abnormalities of gait and mobility  Abnormal posture  Rationale for Evaluation and Treatment: Rehabilitation  SUBJECTIVE:                                                                                                                                                                                             SUBJECTIVE STATEMENT: Caregiver reports he's standing and walking some.  He is performing some of the dressing activities on his own..   Pt accompanied by: self, caregiver -[name]  PERTINENT HISTORY: DMII, GERD, HLD, HTN, anemia, schizophrenia, seizure, stroke with L hemiparesis, benign intracranial tumor s/p craniotomy 2004, dysphagia, cognitive deficit   PAIN:  Are you having pain? No  PRECAUTIONS: Fall and Other: seizures  WEIGHT BEARING RESTRICTIONS: No  FALLS: Has patient fallen in last 6 months? No  LIVING ENVIRONMENT: Per OT's note as caregiver not present for PT session:  Lives with: lives in an adult home Lives in: House/apartment Stairs: No Has following equipment at home: Dan Humphreys - 4 wheeled, Wheelchair (power), shower chair, and Grab bars   PLOF:  Mod I for toileting, but Min-mod assist for ADLs.  Utilized Rollator for mobility prior to most recent hospitalization  PATIENT GOALS: pt agrees that he wants to work on moving better, get stronger.  OBJECTIVE:   TODAY'S TREATMENT: 07/03/2023 Activity Comments  Self care-discussed POC and patient goals See below  Gait training: 3 min:  215 ft with L PFRW 3 min 205 ft with RW Min/mod assist at times, to assist with  slowed pace, avoiding L veering with gait  See MMT below   Sit<>stand x 5 reps from mat, 2 sets Cues and assist for slowed pace, use of ball on floor as target for initial forward lean  Seated overhead reach with ball for posture work   Standing EOM, 10 sec, then  30 sec, min assist  Intermittent UE support  Seated knee flexion-assisted with foam roller and PT assist, 2 x 10 reps              HOME EXERCISE PROGRAM: Access Code: [email or web address] URL: [email or web address] Date: 04/25/2023 Prepared by: [facility] - Outpatient  Rehab - [facility]  Exercises - Seated March  - 1 x daily - 5 x weekly - 2 sets - 10 reps - Seated Long Arc Quad  - 1 x daily - 5 x weekly - 2 sets - 10 reps - Seated Hip Adduction Isometrics with Ball  - 1 x daily - 5 x weekly - 2 sets - 10 reps - 3 sec hold - Sit to Stand with Counter Support  - 1 x daily - 5 x weekly - 2 sets - 10 reps  PATIENT EDUCATION: Education details: PT POC-per phone conversations with previous therapist regarding patient, OPPT to continue.  Discussed POC and progress towards goals.  Discussed updating goals to reflect pt's continued goal for improved strength and more walking.  Discussed options for RW-platform versus walker splint for ease of LUE positioning.  Discussed pt NOT walking by himself at home. Person educated: Patient and Caregiver Vill[place]on Education method: Explanation and Verbal cues Education comprehension: verbalized understanding   ____________________________________________________________________________________________________________________________________ Below measures were taken at time of initial evaluation unless otherwise specified: DIAGNOSTIC FINDINGS: 04/10/23 brain MRI: No acute intracranial process. Unchanged resection cavity with surrounding encephalomalacia and gliosis in the right frontal lobe. COGNITION: Overall cognitive status: History of cognitive impairments - at  baseline SENSATION: Intact in B Les to light touch COORDINATION: Alternating toe tap: pt lifting R and L LE with hip flexors rather than DF; limited motion on L MUSCLE TONE: appears intact in L LE but limited by cognition/understanding on R LE d/t tendency to try to perform AROM POSTURE: posterior pelvic tilt and rounded spine in w/c, head bend downward and to the R LOWER EXTREMITY MMT:    MMT Right Eval RIGHT 07/03/2023 Left Eval LEFT 07/03/2023  Hip flexion 4 4 3  3+  Hip extension      Hip abduction 4 4 2  3-  Hip adduction 4 4 2  3-  Hip internal rotation      Hip external rotation      Knee flexion 4+ 4+ 1 2  Knee extension 4+ 4+ 4- 4  Ankle dorsiflexion 4  0 (unable to elicit contraction but unsure if limited by cognition)   Ankle plantarflexion 4  0  (unable to elicit contraction but unsure if limited by cognition)   Ankle inversion      Ankle eversion       (Blank rows = not tested) LOWER EXTREMITY MMT:   AROM Right Eval Left Eval  Hip flexion    Hip extension    Hip abduction    Hip adduction    Hip internal rotation    Hip external rotation    Knee flexion    Knee extension    Ankle dorsiflexion 6 -15  Ankle plantarflexion    Ankle inversion    Ankle eversion    (Blank rows = not tested) TRANSFERS: Assistive device utilized: Environmental consultant - 2 wheeled  Sit to stand: Min A and x2 Stand to sit: Min A and x2 Pt able to perform marching with R LE but unable to move L LE; required assist in managing L LE on RW GAIT: Gait pattern:  pt unable today Trialed self-w/c propulsion with R UE  but with limited ability and path deviation  GOALS: Goals reviewed with patient? Yes  SHORT TERM GOALS: Target date: 05/16/2023  Patient to perform initial HEP with caregiver assist. Baseline: HEP initiated Goal status: has been prescribed-- unsure of compliance with group home 05/29/23  LONG TERM GOALS: Target date: 06/06/2023>08/09/2023 UPDATED LTG DATE  Patient to perform advanced HEP  with caregiver assist. Baseline: HEP has been initiated, caregiver reports some performance of HEP Goal status: NOT YET FULLY METIN PROGRESS 07/03/2023  Patient to demonstrate L LE strength >/=3/5.  Baseline: See above, progressing Goal status: PARTIALLY MET, IN PROGRESS 07/03/2023  Patient to demonstrate improved head, pelvic, and spinal position in w/c,  Baseline: rounded posture, head drooping down 07/03/2023 Goal status: NOT MET, 07/03/2023  Patient to perform sit to stand transfer with supervision and LRAD.   Baseline: min A with RW, 07/03/2023 Goal status: NOT YET MET, IN PROGRESS 07/03/2023  Patient to ambulate 30 feet with LRAD and CGA. (REVISED GOAL:  Pt to ambulate 200 ft with LRAD and CGA) Baseline: 215 ft with RW/platform RW  with min/mod assist Goal status: PARTIAL MET, REVISED 07/03/2023  Pt will stand 30-60 seconds with intermittent UE support and CGA for ease of clothing management and toileting. Baseline:  10-30 sec with min assist and intermittent UE support Goal status:  NEW  ASSESSMENT: CLINICAL IMPRESSION: Pt presents today after break from PT.  Initially, previous therapist discussed discharge and attempts at HHPT; however, further communication was that pt's group home would not accept Home Health therapies, so this pt presents back to OPPT.  Re-eval, recert, progress note performed today.  Pt and caregiver report things going well.  Caregiver reports he is doing more for dressing himself (sitting down) and he is doing some walking with RW at group home.  Goals assessed, with pt partially meeting goal for improved strength and gait; pt is ambulating farther distances, but he needs min/mod assist due to decreased safety awareness, L sided weakness and difficulty positioning L hand at RW.  He is progressing towards remaining goals for transfers, strength, gait, and new goal added for standing participation for ADLs.  He will benefit from continued skilled PT towards  updated/ongoing goals for improved strength, balance, gait for improved functional mobility, gait in group home and decreased fall risk.  OBJECTIVE IMPAIRMENTS: Abnormal gait, decreased activity tolerance, decreased balance, decreased mobility, difficulty walking, decreased ROM, decreased strength, impaired flexibility, improper body mechanics, and postural dysfunction.   PLAN:  PT FREQUENCY: 1x/week  PT DURATION: 6 weeks per recert 07/03/2023  PLANNED INTERVENTIONS: Therapeutic exercises, Therapeutic activity, Neuromuscular re-education, Balance training, Gait training, Patient/Family education, Self Care, Joint mobilization, Stair training, Vestibular training, Canalith repositioning, Orthotic/Fit training, DME instructions, Aquatic Therapy, Dry Needling, Electrical stimulation, Cryotherapy, Moist heat, Taping, Manual therapy, and Re-evaluation  PLAN FOR NEXT SESSION: Try to update HEP, discuss/make recommendation about platform RW versus hand position assist for LUE to assist with RW management.  Continue STS transfers at [facility] or parallel bars, gait and balance training, wt shifting onto LLE   07/04/2023 Gean Maidens., PT  [facility] at [facility] [address] [place], [address] [place], [place] [address] Phone # [phone] Fax # [phone]

## 2023-07-09 ENCOUNTER — Encounter
Payer: Medicare Other | Attending: Physical Medicine and Rehabilitation | Admitting: Physical Medicine and Rehabilitation

## 2023-07-09 NOTE — Therapy (Signed)
OUTPATIENT PHYSICAL THERAPY NEURO TREATMENT  Patient Name: [name] MRN: [id number] DOB:14-Jun-1956, 67 y.o., male Today's Date: 07/10/2023  PCP: Lucretia Field REFERRING PROVIDER: Rolly Salter, MD     END OF SESSION:  PT End of Session - 07/10/23 1637     Visit Number 8    Number of Visits 12    Date for PT Re-Evaluation 08/09/23    Authorization Type Medicare/Medicaid    PT Start Time 1444    PT Stop Time 1524    PT Time Calculation (min) 40 min    Equipment Utilized During Treatment Gait belt    Activity Tolerance Patient tolerated treatment well    Behavior During Therapy Impulsive             Past Medical History:  Diagnosis Date   Barrett esophagus    DM II (diabetes mellitus, type II), controlled (HCC) 11/13/2020   Dysphagia    Gastroparesis    GERD (gastroesophageal reflux disease)    Hypercholesteremia    Hypertension    Iron deficiency anemia    Mental retardation    Schizophrenia (HCC)    Seizure (HCC)    Stroke [facility])    Past Surgical History:  Procedure Laterality Date   BIOPSY  10/21/2020   Procedure: BIOPSY;  Surgeon: Iva Boop, MD;  Location: [facility] ENDOSCOPY;  Service: Endoscopy;;   CRANIOTOMY FOR CYST FENESTRATION     ESOPHAGOGASTRODUODENOSCOPY N/A 01/09/2015   Procedure: ESOPHAGOGASTRODUODENOSCOPY (EGD);  Surgeon: Louis Meckel, MD;  Location: [facility] ENDOSCOPY;  Service: Endoscopy;  Laterality: N/A;   ESOPHAGOGASTRODUODENOSCOPY N/A 10/26/2020   Procedure: ESOPHAGOGASTRODUODENOSCOPY (EGD);  Surgeon: Corliss Skains, MD;  Location: [facility] OR;  Service: Thoracic;  Laterality: N/A;   ESOPHAGOGASTRODUODENOSCOPY (EGD) WITH PROPOFOL N/A 10/21/2020   Procedure: ESOPHAGOGASTRODUODENOSCOPY (EGD) WITH PROPOFOL;  Surgeon: Iva Boop, MD;  Location: [facility] ENDOSCOPY;  Service: Endoscopy;  Laterality: N/A;   IR GASTR TUBE CONVERT GASTR-JEJ PER W/FL MOD SED  11/07/2020   PEG PLACEMENT N/A 10/26/2020   Procedure: PERCUTANEOUS ENDOSCOPIC  GASTROSTOMY (PEG) PLACEMENT;  Surgeon: Corliss Skains, MD;  Location: [facility] OR;  Service: Thoracic;  Laterality: N/A;   XI ROBOTIC ASSISTED HIATAL HERNIA REPAIR N/A 10/26/2020   Procedure: XI ROBOTIC ASSISTED HIATAL HERNIA REPAIR;  Surgeon: Corliss Skains, MD;  Location: [facility] OR;  Service: Thoracic;  Laterality: N/A;   Patient Active Problem List   Diagnosis Date Noted   Polypharmacy 06/26/2023   Stroke-like symptoms 06/05/2023   Mixed hyperlipidemia 05/26/2023   Intellectual developmental delay 05/26/2023   General weakness 04/09/2023   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) 10/26/2021   Seizure disorder (HCC) 10/26/2021   S/P craniotomy 03/09/2021   Dilation of esophagus    Hyponatremia    Dysphagia    Seizures (HCC)    S/P percutaneous endoscopic gastrostomy (PEG) tube placement (HCC)    Weakness 11/15/2020   Bowel obstruction (HCC)    S/P repair of paraesophageal hernia 10/26/2020   Syncope 10/21/2020   Hiatal hernia 10/21/2020   Acute hypoxemic respiratory failure (HCC) 10/21/2020   Sinus tachycardia 10/21/2020   Gastric polyps    Altered mental status 09/05/2020   Emesis 07/24/2015   Coffee ground emesis    Gastrointestinal hemorrhage associated with other gastritis    Acute esophagitis 01/09/2015   Upper gastrointestinal bleed    Diarrhea    Nausea with vomiting    GI bleed 01/08/2015   GIB (gastrointestinal bleeding)  01/08/2015   Nausea & vomiting 01/08/2015   Reflux esophagitis 07/04/2009   Loss of weight 07/04/2009   BLOOD IN STOOL, OCCULT 07/04/2009   ANEMIA, IRON DEFICIENCY 05/04/2008   Hypothyroidism 04/05/2008   Schizophrenia, unspecified type (HCC) 04/05/2008   Depression 04/05/2008   Hemiplegia (HCC) 04/05/2008   Essential hypertension 04/05/2008   GERD 04/05/2008   Gastroparesis 04/05/2008   Acute metabolic encephalopathy 04/05/2008   History of diverticulosis 08/21/2004   Barrett's  esophagus 04/01/2002   HIATAL HERNIA 04/01/2002    ONSET DATE: 04/09/23 REFERRING DIAG: R53.1 (ICD-10-CM) - Left-sided weakness G40.919 (ICD-10-CM) - Breakthrough seizure (HCC) THERAPY DIAG:  Muscle weakness (generalized)  Hemiplegia and hemiparesis following cerebral infarction affecting left non-dominant side (HCC)  Other lack of coordination  Rationale for Evaluation and Treatment: Rehabilitation  SUBJECTIVE:                                                                                                                                                                                             SUBJECTIVE STATEMENT: Feeling fine.   Pt accompanied by: self, caregiver -[name]  PERTINENT HISTORY: DMII, GERD, HLD, HTN, anemia, schizophrenia, seizure, stroke with L hemiparesis, benign intracranial tumor s/p craniotomy 2004, dysphagia, cognitive deficit   PAIN:  Are you having pain? No  PRECAUTIONS: Fall and Other: seizures  WEIGHT BEARING RESTRICTIONS: No  FALLS: Has patient fallen in last 6 months? No  LIVING ENVIRONMENT: Per OT's note as caregiver not present for PT session:  Lives with: lives in an adult home Lives in: House/apartment Stairs: No Has following equipment at home: Dan Humphreys - 4 wheeled, Wheelchair (power), shower chair, and Grab bars   PLOF:  Mod I for toileting, but Min-mod assist for ADLs.  Utilized Rollator for mobility prior to most recent hospitalization  PATIENT GOALS: pt agrees that he wants to work on moving better, get stronger.  OBJECTIVE:     TODAY'S TREATMENT: 07/10/23 Activity Comments  in II bars: standing balance with alt UE raise head turns R/L wt shifting  Holding onto II bar with 1 UE at a time, but able to maintain standing balance for at least 20 sec at a time unsupported with dynamic challenges   STS transfers in II bars 9x  Cueing for hand placement and scooting bottom forward before standing up. Performed with CGA-min A upon  standing but able to maintain static and dynamic balance with reaching/giving PT high fives with CGA only   gait training with platform walker on L 2 trials of 29ft each Mod A for max control, pacing, and bringing L foot through; patient with uncontrolled turns and excessively  quick pace. Required frequent breaks to stop and reset before continuing. Responded well to PT pushing down on walker to decrease pace              PATIENT EDUCATION: Education details: answered guardian's ([name]) questions on patient's level of required assist and safety recommendations with mobility - PT suggested standing tasks for ADLs at group home such as brushing teeth and washing face with min A but advised to avoid gait training in the group home d/t significant safety risk right now  Person educated: Patient, Counselling psychologist, and [name] Education method: Medical illustrator Education comprehension: verbalized understanding      HOME EXERCISE PROGRAM: Access Code: [email or web address] URL: [email or web address] Date: 04/25/2023 Prepared by: [facility] - Outpatient  Rehab - [facility]  Exercises - Seated March  - 1 x daily - 5 x weekly - 2 sets - 10 reps - Seated Long Arc Quad  - 1 x daily - 5 x weekly - 2 sets - 10 reps - Seated Hip Adduction Isometrics with Ball  - 1 x daily - 5 x weekly - 2 sets - 10 reps - 3 sec hold - Sit to Stand with Counter Support  - 1 x daily - 5 x weekly - 2 sets - 10 reps  PATIENT EDUCATION: Education details: PT POC-per phone conversations with previous therapist regarding patient, OPPT to continue.  Discussed POC and progress towards goals.  Discussed updating goals to reflect pt's continued goal for improved strength and more walking.  Discussed options for RW-platform versus walker splint for ease of LUE positioning.  Discussed pt NOT walking by himself at home. Person educated: Patient and Caregiver Posthumus Education method: Explanation and Verbal  cues Education comprehension: verbalized understanding   ____________________________________________________________________________________________________________________________________ Below measures were taken at time of initial evaluation unless otherwise specified: DIAGNOSTIC FINDINGS: 04/10/23 brain MRI: No acute intracranial process. Unchanged resection cavity with surrounding encephalomalacia and gliosis in the right frontal lobe. COGNITION: Overall cognitive status: History of cognitive impairments - at baseline SENSATION: Intact in B Les to light touch COORDINATION: Alternating toe tap: pt lifting R and L LE with hip flexors rather than DF; limited motion on L MUSCLE TONE: appears intact in L LE but limited by cognition/understanding on R LE d/t tendency to try to perform AROM POSTURE: posterior pelvic tilt and rounded spine in w/c, head bend downward and to the R LOWER EXTREMITY MMT:    MMT Right Eval RIGHT 07/03/2023 Left Eval LEFT 07/03/2023  Hip flexion 4 4 3  3+  Hip extension      Hip abduction 4 4 2  3-  Hip adduction 4 4 2  3-  Hip internal rotation      Hip external rotation      Knee flexion 4+ 4+ 1 2  Knee extension 4+ 4+ 4- 4  Ankle dorsiflexion 4  0 (unable to elicit contraction but unsure if limited by cognition)   Ankle plantarflexion 4  0  (unable to elicit contraction but unsure if limited by cognition)   Ankle inversion      Ankle eversion       (Blank rows = not tested) LOWER EXTREMITY MMT:   AROM Right Eval Left Eval  Hip flexion    Hip extension    Hip abduction    Hip adduction    Hip internal rotation    Hip external rotation    Knee flexion    Knee extension    Ankle dorsiflexion 6 -15  Ankle  plantarflexion    Ankle inversion    Ankle eversion    (Blank rows = not tested) TRANSFERS: Assistive device utilized: Environmental consultant - 2 wheeled  Sit to stand: Min A and x2 Stand to sit: Min A and x2 Pt able to perform marching with R LE but unable to  move L LE; required assist in managing L LE on RW GAIT: Gait pattern:  pt unable today Trialed self-w/c propulsion with R UE but with limited ability and path deviation  GOALS: Goals reviewed with patient? Yes  SHORT TERM GOALS: Target date: 05/16/2023  Patient to perform initial HEP with caregiver assist. Baseline: HEP initiated Goal status: has been prescribed-- unsure of compliance with group home 05/29/23  LONG TERM GOALS: Target date: 06/06/2023>08/09/2023 UPDATED LTG DATE  Patient to perform advanced HEP with caregiver assist. Baseline: HEP has been initiated, caregiver reports some performance of HEP Goal status: NOT YET FULLY METIN PROGRESS 07/03/2023  Patient to demonstrate L LE strength >/=3/5.  Baseline: See above, progressing Goal status: PARTIALLY MET, IN PROGRESS 07/03/2023  Patient to demonstrate improved head, pelvic, and spinal position in w/c,  Baseline: rounded posture, head drooping down 07/03/2023 Goal status: NOT MET, 07/03/2023  Patient to perform sit to stand transfer with supervision and LRAD.   Baseline: min A with RW, 07/03/2023 Goal status: NOT YET MET, IN PROGRESS 07/03/2023  Patient to ambulate 30 feet with LRAD and CGA. (REVISED GOAL:  Pt to ambulate 200 ft with LRAD and CGA) Baseline: 215 ft with RW/platform RW  with min/mod assist Goal status: PARTIAL MET, REVISED 07/03/2023  Pt will stand 30-60 seconds with intermittent UE support and CGA for ease of clothing management and toileting. Baseline:  10-30 sec with min assist and intermittent UE support Goal status:  IN PROGRESS  ASSESSMENT: CLINICAL IMPRESSION: Patient arrived to session with caregiver, without complaints. Patient performed STS transfers in II bars with heavy cueing for safe set up. 1 episode of uncontrolled STS causing w/c to tip backwards an requiring edu on performing movements slowly, with control, and only when prompted. Patient performed gait training with hemi walker with mod A and  several cues for maximal safety and stability. Patient's pacing a lack of safety awareness continues to be the main barrier in safe ambulation in group home setting. This information was relayed to pt's guardian and she reported understanding. No complaints upon leaving.   OBJECTIVE IMPAIRMENTS: Abnormal gait, decreased activity tolerance, decreased balance, decreased mobility, difficulty walking, decreased ROM, decreased strength, impaired flexibility, improper body mechanics, and postural dysfunction.   PLAN:  PT FREQUENCY: 1x/week  PT DURATION: 6 weeks per recert 07/03/2023  PLANNED INTERVENTIONS: Therapeutic exercises, Therapeutic activity, Neuromuscular re-education, Balance training, Gait training, Patient/Family education, Self Care, Joint mobilization, Stair training, Vestibular training, Canalith repositioning, Orthotic/Fit training, DME instructions, Aquatic Therapy, Dry Needling, Electrical stimulation, Cryotherapy, Moist heat, Taping, Manual therapy, and Re-evaluation  PLAN FOR NEXT SESSION: Try to update HEP, discuss/make recommendation about platform RW versus hand position assist for LUE to assist with RW management.  Continue STS transfers at [facility] or parallel bars, gait and balance training, wt shifting onto LLE    Anette Guarneri, PT, DPT 07/10/23 4:44 PM  [facility] at [facility] [address] [place], [place] [address] Phone # [phone] Fax # [phone]

## 2023-07-10 ENCOUNTER — Ambulatory Visit: Payer: Medicare Other | Admitting: Occupational Therapy

## 2023-07-10 ENCOUNTER — Ambulatory Visit: Payer: Medicare Other | Admitting: Physical Therapy

## 2023-07-10 ENCOUNTER — Encounter: Payer: Self-pay | Admitting: Physical Therapy

## 2023-07-10 DIAGNOSIS — I69354 Hemiplegia and hemiparesis following cerebral infarction affecting left non-dominant side: Secondary | ICD-10-CM

## 2023-07-10 DIAGNOSIS — R278 Other lack of coordination: Secondary | ICD-10-CM

## 2023-07-10 DIAGNOSIS — M6281 Muscle weakness (generalized): Secondary | ICD-10-CM

## 2023-07-10 DIAGNOSIS — R2681 Unsteadiness on feet: Secondary | ICD-10-CM | POA: Diagnosis not present

## 2023-07-16 NOTE — Therapy (Signed)
OUTPATIENT PHYSICAL THERAPY NEURO TREATMENT  Patient Name: [name] MRN: [id number] DOB:1956/10/10, 67 y.o., male Today's Date: 07/17/2023  PCP: Lucretia Field REFERRING PROVIDER: Rolly Salter, MD     END OF SESSION:  PT End of Session - 07/17/23 1436     Visit Number 9    Number of Visits 12    Date for PT Re-Evaluation 08/09/23    Authorization Type Medicare/Medicaid    PT Start Time 1353    PT Stop Time 1433    PT Time Calculation (min) 40 min    Equipment Utilized During Treatment Gait belt    Activity Tolerance Patient tolerated treatment well    Behavior During Therapy Impulsive              Past Medical History:  Diagnosis Date   Barrett esophagus    DM II (diabetes mellitus, type II), controlled (HCC) 11/13/2020   Dysphagia    Gastroparesis    GERD (gastroesophageal reflux disease)    Hypercholesteremia    Hypertension    Iron deficiency anemia    Mental retardation    Schizophrenia (HCC)    Seizure (HCC)    Stroke [facility])    Past Surgical History:  Procedure Laterality Date   BIOPSY  10/21/2020   Procedure: BIOPSY;  Surgeon: Iva Boop, MD;  Location[facility] ENDOSCOPY;  Service: Endoscopy;;   CRANIOTOMY FOR CYST FENESTRATION     ESOPHAGOGASTRODUODENOSCOPY N/A 01/09/2015   Procedure: ESOPHAGOGASTRODUODENOSCOPY (EGD);  Surgeon: Louis Meckel, MD;  Location: [facility] ENDOSCOPY;  Service: Endoscopy;  Laterality: N/A;   ESOPHAGOGASTRODUODENOSCOPY N/A 10/26/2020   Procedure: ESOPHAGOGASTRODUODENOSCOPY (EGD);  Surgeon: Corliss Skains, MD;  Location[facility] OR;  Service: Thoracic;  Laterality: N/A;   ESOPHAGOGASTRODUODENOSCOPY (EGD) WITH PROPOFOL N/A 10/21/2020   Procedure: ESOPHAGOGASTRODUODENOSCOPY (EGD) WITH PROPOFOL;  Surgeon: Iva Boop, MD;  Location: [facility] ENDOSCOPY;  Service: Endoscopy;  Laterality: N/A;   IR GASTR TUBE CONVERT GASTR-JEJ PER W/FL MOD SED  11/07/2020   PEG PLACEMENT N/A 10/26/2020   Procedure: PERCUTANEOUS ENDOSCOPIC  GASTROSTOMY (PEG) PLACEMENT;  Surgeon: Corliss Skains, MD;  Location: [facility] OR;  Service: Thoracic;  Laterality: N/A;   XI ROBOTIC ASSISTED HIATAL HERNIA REPAIR N/A 10/26/2020   Procedure: XI ROBOTIC ASSISTED HIATAL HERNIA REPAIR;  Surgeon: Corliss Skains, MD;  Location: [facility] OR;  Service: Thoracic;  Laterality: N/A;   Patient Active Problem List   Diagnosis Date Noted   Polypharmacy 06/26/2023   Stroke-like symptoms 06/05/2023   Mixed hyperlipidemia 05/26/2023   Intellectual developmental delay 05/26/2023   General weakness 04/09/2023   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) 10/26/2021   Seizure disorder (HCC) 10/26/2021   S/P craniotomy 03/09/2021   Dilation of esophagus    Hyponatremia    Dysphagia    Seizures (HCC)    S/P percutaneous endoscopic gastrostomy (PEG) tube placement (HCC)    Weakness 11/15/2020   Bowel obstruction (HCC)    S/P repair of paraesophageal hernia 10/26/2020   Syncope 10/21/2020   Hiatal hernia 10/21/2020   Acute hypoxemic respiratory failure (HCC) 10/21/2020   Sinus tachycardia 10/21/2020   Gastric polyps    Altered mental status 09/05/2020   Emesis 07/24/2015   Coffee ground emesis    Gastrointestinal hemorrhage associated with other gastritis    Acute esophagitis 01/09/2015   Upper gastrointestinal bleed    Diarrhea    Nausea with vomiting    GI bleed 01/08/2015   GIB (gastrointestinal  bleeding) 01/08/2015   Nausea & vomiting 01/08/2015   Reflux esophagitis 07/04/2009   Loss of weight 07/04/2009   BLOOD IN STOOL, OCCULT 07/04/2009   ANEMIA, IRON DEFICIENCY 05/04/2008   Hypothyroidism 04/05/2008   Schizophrenia, unspecified type (HCC) 04/05/2008   Depression 04/05/2008   Hemiplegia (HCC) 04/05/2008   Essential hypertension 04/05/2008   GERD 04/05/2008   Gastroparesis 04/05/2008   Acute metabolic encephalopathy 04/05/2008   History of diverticulosis 08/21/2004   Barrett's  esophagus 04/01/2002   HIATAL HERNIA 04/01/2002    ONSET DATE: 04/09/23 REFERRING DIAG: R53.1 (ICD-10-CM) - Left-sided weakness G40.919 (ICD-10-CM) - Breakthrough seizure (HCC) THERAPY DIAG:  Muscle weakness (generalized)  Hemiplegia and hemiparesis following cerebral infarction affecting left non-dominant side (HCC)  Rationale for Evaluation and Treatment: Rehabilitation  SUBJECTIVE:                                                                                                                                                                                             SUBJECTIVE STATEMENT: Doing good. Caregiver reports that he has been walking with the patient with a RW at the group home, however reports that not all caregivers at the home feel comfortable working on gait training.   Pt accompanied by: self, caregiver -[name]   PERTINENT HISTORY: DMII, GERD, HLD, HTN, anemia, schizophrenia, seizure, stroke with L hemiparesis, benign intracranial tumor s/p craniotomy 2004, dysphagia, cognitive deficit   PAIN:  Are you having pain? No  PRECAUTIONS: Fall and Other: seizures  WEIGHT BEARING RESTRICTIONS: No  FALLS: Has patient fallen in last 6 months? No  LIVING ENVIRONMENT: Per OT's note as caregiver not present for PT session:  Lives with: lives in an adult home Lives in: House/apartment Stairs: No Has following equipment at home: Dan Humphreys - 4 wheeled, Wheelchair (power), shower chair, and Grab bars   PLOF:  Mod I for toileting, but Min-mod assist for ADLs.  Utilized Rollator for mobility prior to most recent hospitalization  PATIENT GOALS: pt agrees that he wants to work on moving better, get stronger.  OBJECTIVE:    TODAY'S TREATMENT: 07/17/23 Activity Comments  Nustep L4 x 5 min  Taking ~3 sitting rest breaks d/t fatigue ; required cueing for pacing   Stand pivot transfer w/c to nustep and mat <>w/c Min A for balance and set up  STS from mat 10x with RW in front   Cueing for safe hand placement, standing tall, pacing    Standing ring toss game in standing with RW in front  Min A for stability, cueing for safety, reaching back for mat to sit down   gait training with RW  2x175ft Cueing required to avoid looking down, keeping feet inside the RW, slowing down           PATIENT EDUCATION: Education details: educated caregiver on activities that were safe to perform at group home; advised okay to perform gait training at group home depending on each individual caregiver's comfort level  Person educated: Patient and Caregiver Gritton  Education method: Explanation Education comprehension: verbalized understanding   HOME EXERCISE PROGRAM: Access Code: [email or web address] URL: [email or web address] Date: 04/25/2023 Prepared by: [facility] - Outpatient  Rehab - [facility]  Exercises - Seated March  - 1 x daily - 5 x weekly - 2 sets - 10 reps - Seated Long Arc Quad  - 1 x daily - 5 x weekly - 2 sets - 10 reps - Seated Hip Adduction Isometrics with Ball  - 1 x daily - 5 x weekly - 2 sets - 10 reps - 3 sec hold - Sit to Stand with Counter Support  - 1 x daily - 5 x weekly - 2 sets - 10 reps    ____________________________________________________________________________________________________________________________________ Below measures were taken at time of initial evaluation unless otherwise specified: DIAGNOSTIC FINDINGS: 04/10/23 brain MRI: No acute intracranial process. Unchanged resection cavity with surrounding encephalomalacia and gliosis in the right frontal lobe. COGNITION: Overall cognitive status: History of cognitive impairments - at baseline SENSATION: Intact in B Les to light touch COORDINATION: Alternating toe tap: pt lifting R and L LE with hip flexors rather than DF; limited motion on L MUSCLE TONE: appears intact in L LE but limited by cognition/understanding on R LE d/t tendency to try to perform AROM POSTURE: posterior  pelvic tilt and rounded spine in w/c, head bend downward and to the R LOWER EXTREMITY MMT:    MMT Right Eval RIGHT 07/03/2023 Left Eval LEFT 07/03/2023  Hip flexion 4 4 3  3+  Hip extension      Hip abduction 4 4 2  3-  Hip adduction 4 4 2  3-  Hip internal rotation      Hip external rotation      Knee flexion 4+ 4+ 1 2  Knee extension 4+ 4+ 4- 4  Ankle dorsiflexion 4  0 (unable to elicit contraction but unsure if limited by cognition)   Ankle plantarflexion 4  0  (unable to elicit contraction but unsure if limited by cognition)   Ankle inversion      Ankle eversion       (Blank rows = not tested) LOWER EXTREMITY MMT:   AROM Right Eval Left Eval  Hip flexion    Hip extension    Hip abduction    Hip adduction    Hip internal rotation    Hip external rotation    Knee flexion    Knee extension    Ankle dorsiflexion 6 -15  Ankle plantarflexion    Ankle inversion    Ankle eversion    (Blank rows = not tested) TRANSFERS: Assistive device utilized: Environmental consultant - 2 wheeled  Sit to stand: Min A and x2 Stand to sit: Min A and x2 Pt able to perform marching with R LE but unable to move L LE; required assist in managing L LE on RW GAIT: Gait pattern:  pt unable today Trialed self-w/c propulsion with R UE but with limited ability and path deviation  GOALS: Goals reviewed with patient? Yes  SHORT TERM GOALS: Target date: 05/16/2023  Patient to perform initial HEP with caregiver assist. Baseline: HEP initiated Goal status: has been prescribed--  unsure of compliance with group home 05/29/23  LONG TERM GOALS: Target date: 06/06/2023>08/09/2023 UPDATED LTG DATE  Patient to perform advanced HEP with caregiver assist. Baseline: HEP has been initiated, caregiver reports some performance of HEP Goal status: NOT YET FULLY METIN PROGRESS 07/03/2023  Patient to demonstrate L LE strength >/=3/5.  Baseline: See above, progressing Goal status: PARTIALLY MET, IN PROGRESS 07/03/2023  Patient to  demonstrate improved head, pelvic, and spinal position in w/c,  Baseline: rounded posture, head drooping down 07/03/2023 Goal status: NOT MET, 07/03/2023  Patient to perform sit to stand transfer with supervision and LRAD.   Baseline: min A with RW, 07/03/2023 Goal status: NOT YET MET, IN PROGRESS 07/03/2023  Patient to ambulate 30 feet with LRAD and CGA. (REVISED GOAL:  Pt to ambulate 200 ft with LRAD and CGA) Baseline: 215 ft with RW/platform RW  with min/mod assist Goal status: PARTIAL MET, REVISED 07/03/2023  Pt will stand 30-60 seconds with intermittent UE support and CGA for ease of clothing management and toileting. Baseline:  10-30 sec with min assist and intermittent UE support Goal status:  IN PROGRESS  ASSESSMENT: CLINICAL IMPRESSION: Patient arrived to session with caregiver without complaints. Gait training with RW rather than hemi walker revealed improved pacing and control, however pt still requires cueing for pacing, safe navigation around gym, and looking ahead rather than down. Provided cues for safety awareness with transfers at [facility] rather than parallel bars and worked on dynamic standing balance. Patient demonstrated stability and tolerance for activities improved from previous sessions. Noted fatigue at end of session.  OBJECTIVE IMPAIRMENTS: Abnormal gait, decreased activity tolerance, decreased balance, decreased mobility, difficulty walking, decreased ROM, decreased strength, impaired flexibility, improper body mechanics, and postural dysfunction.   PLAN:  PT FREQUENCY: 1x/week  PT DURATION: 6 weeks per recert 07/03/2023  PLANNED INTERVENTIONS: Therapeutic exercises, Therapeutic activity, Neuromuscular re-education, Balance training, Gait training, Patient/Family education, Self Care, Joint mobilization, Stair training, Vestibular training, Canalith repositioning, Orthotic/Fit training, DME instructions, Aquatic Therapy, Dry Needling, Electrical stimulation, Cryotherapy,  Moist heat, Taping, Manual therapy, and Re-evaluation  PLAN FOR NEXT SESSION: Try to update HEP, discuss/make recommendation about platform RW versus hand position assist for LUE to assist with RW management.  Continue STS transfers at [facility] or parallel bars, gait and balance training, wt shifting onto LLE    Anette Guarneri, PT, DPT 07/17/23 2:41 PM  [facility] at [facility] [address] [place], [address] [place], [place] [address] Phone # [phone] Fax # [phone]

## 2023-07-17 ENCOUNTER — Encounter: Payer: Medicare Other | Admitting: Occupational Therapy

## 2023-07-17 ENCOUNTER — Encounter: Payer: Self-pay | Admitting: Physical Therapy

## 2023-07-17 ENCOUNTER — Ambulatory Visit: Payer: Medicare Other | Admitting: Physical Therapy

## 2023-07-17 DIAGNOSIS — I69354 Hemiplegia and hemiparesis following cerebral infarction affecting left non-dominant side: Secondary | ICD-10-CM

## 2023-07-17 DIAGNOSIS — R2681 Unsteadiness on feet: Secondary | ICD-10-CM | POA: Diagnosis not present

## 2023-07-17 DIAGNOSIS — M6281 Muscle weakness (generalized): Secondary | ICD-10-CM

## 2023-07-24 ENCOUNTER — Encounter: Payer: Medicare Other | Admitting: Occupational Therapy

## 2023-07-24 ENCOUNTER — Encounter: Payer: Self-pay | Admitting: Physical Therapy

## 2023-07-24 ENCOUNTER — Ambulatory Visit: Payer: Medicare Other | Admitting: Physical Therapy

## 2023-07-24 DIAGNOSIS — I69354 Hemiplegia and hemiparesis following cerebral infarction affecting left non-dominant side: Secondary | ICD-10-CM

## 2023-07-24 DIAGNOSIS — M6281 Muscle weakness (generalized): Secondary | ICD-10-CM

## 2023-07-24 DIAGNOSIS — R2681 Unsteadiness on feet: Secondary | ICD-10-CM | POA: Diagnosis not present

## 2023-07-24 DIAGNOSIS — R278 Other lack of coordination: Secondary | ICD-10-CM

## 2023-07-24 NOTE — Therapy (Signed)
OUTPATIENT PHYSICAL THERAPY NEURO TREATMENT  Patient Name: [name] MRN: [id number] DOB:08/08/1956, 67 y.o., male Today's Date: 07/24/2023  PCP: Lucretia Field REFERRING PROVIDER: Rolly Salter, MD     END OF SESSION:  PT End of Session - 07/24/23 1410     Visit Number 10    Number of Visits 12    Date for PT Re-Evaluation 08/09/23    Authorization Type Medicare/Medicaid    PT Start Time 1402    PT Stop Time 1436   pt refused further PT today, stated "that's it for me I don't want to do more"   PT Time Calculation (min) 34 min    Equipment Utilized During Treatment Gait belt    Activity Tolerance Patient tolerated treatment well    Behavior During Therapy Impulsive;Flat affect               Past Medical History:  Diagnosis Date   Barrett esophagus    DM II (diabetes mellitus, type II), controlled (HCC) 11/13/2020   Dysphagia    Gastroparesis    GERD (gastroesophageal reflux disease)    Hypercholesteremia    Hypertension    Iron deficiency anemia    Mental retardation    Schizophrenia (HCC)    Seizure (HCC)    Stroke [facility])    Past Surgical History:  Procedure Laterality Date   BIOPSY  10/21/2020   Procedure: BIOPSY;  Surgeon: Iva Boop, MD;  Location: [facility] ENDOSCOPY;  Service: Endoscopy;;   CRANIOTOMY FOR CYST FENESTRATION     ESOPHAGOGASTRODUODENOSCOPY N/A 01/09/2015   Procedure: ESOPHAGOGASTRODUODENOSCOPY (EGD);  Surgeon: Louis Meckel, MD;  Location: [facility] ENDOSCOPY;  Service: Endoscopy;  Laterality: N/A;   ESOPHAGOGASTRODUODENOSCOPY N/A 10/26/2020   Procedure: ESOPHAGOGASTRODUODENOSCOPY (EGD);  Surgeon: Corliss Skains, MD;  Location: [facility] OR;  Service: Thoracic;  Laterality: N/A;   ESOPHAGOGASTRODUODENOSCOPY (EGD) WITH PROPOFOL N/A 10/21/2020   Procedure: ESOPHAGOGASTRODUODENOSCOPY (EGD) WITH PROPOFOL;  Surgeon: Iva Boop, MD;  Location: [facility] ENDOSCOPY;  Service: Endoscopy;  Laterality: N/A;   IR GASTR TUBE CONVERT GASTR-JEJ PER  W/FL MOD SED  11/07/2020   PEG PLACEMENT N/A 10/26/2020   Procedure: PERCUTANEOUS ENDOSCOPIC GASTROSTOMY (PEG) PLACEMENT;  Surgeon: Corliss Skains, MD;  Location: [facility] OR;  Service: Thoracic;  Laterality: N/A;   XI ROBOTIC ASSISTED HIATAL HERNIA REPAIR N/A 10/26/2020   Procedure: XI ROBOTIC ASSISTED HIATAL HERNIA REPAIR;  Surgeon: Corliss Skains, MD;  Location: [facility] OR;  Service: Thoracic;  Laterality: N/A;   Patient Active Problem List   Diagnosis Date Noted   Polypharmacy 06/26/2023   Stroke-like symptoms 06/05/2023   Mixed hyperlipidemia 05/26/2023   Intellectual developmental delay 05/26/2023   General weakness 04/09/2023   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) 10/26/2021   Seizure disorder (HCC) 10/26/2021   S/P craniotomy 03/09/2021   Dilation of esophagus    Hyponatremia    Dysphagia    Seizures (HCC)    S/P percutaneous endoscopic gastrostomy (PEG) tube placement (HCC)    Weakness 11/15/2020   Bowel obstruction (HCC)    S/P repair of paraesophageal hernia 10/26/2020   Syncope 10/21/2020   Hiatal hernia 10/21/2020   Acute hypoxemic respiratory failure (HCC) 10/21/2020   Sinus tachycardia 10/21/2020   Gastric polyps    Altered mental status 09/05/2020   Emesis 07/24/2015   Coffee ground emesis    Gastrointestinal hemorrhage associated with other gastritis    Acute esophagitis 01/09/2015   Upper gastrointestinal bleed  Diarrhea    Nausea with vomiting    GI bleed 01/08/2015   GIB (gastrointestinal bleeding) 01/08/2015   Nausea & vomiting 01/08/2015   Reflux esophagitis 07/04/2009   Loss of weight 07/04/2009   BLOOD IN STOOL, OCCULT 07/04/2009   ANEMIA, IRON DEFICIENCY 05/04/2008   Hypothyroidism 04/05/2008   Schizophrenia, unspecified type (HCC) 04/05/2008   Depression 04/05/2008   Hemiplegia (HCC) 04/05/2008   Essential hypertension 04/05/2008   GERD 04/05/2008   Gastroparesis 04/05/2008   Acute  metabolic encephalopathy 04/05/2008   History of diverticulosis 08/21/2004   Barrett's esophagus 04/01/2002   HIATAL HERNIA 04/01/2002    ONSET DATE: 04/09/23 REFERRING DIAG: R53.1 (ICD-10-CM) - Left-sided weakness G40.919 (ICD-10-CM) - Breakthrough seizure (HCC) THERAPY DIAG:  Muscle weakness (generalized)  Hemiplegia and hemiparesis following cerebral infarction affecting left non-dominant side (HCC)  Other lack of coordination  Rationale for Evaluation and Treatment: Rehabilitation  SUBJECTIVE:                                                                                                                                                                                             SUBJECTIVE STATEMENT:  Nothing new since last time, still making steady improvements    Pt accompanied by: self, caregiver -[name]   PERTINENT HISTORY: DMII, GERD, HLD, HTN, anemia, schizophrenia, seizure, stroke with L hemiparesis, benign intracranial tumor s/p craniotomy 2004, dysphagia, cognitive deficit   PAIN:  Are you having pain? No 0/10  PRECAUTIONS: Fall and Other: seizures  WEIGHT BEARING RESTRICTIONS: No  FALLS: Has patient fallen in last 6 months? No  LIVING ENVIRONMENT: Per OT's note as caregiver not present for PT session:  Lives with: lives in an adult home Lives in: House/apartment Stairs: No Has following equipment at home: Dan Humphreys - 4 wheeled, Wheelchair (power), shower chair, and Grab bars   PLOF:  Mod I for toileting, but Min-mod assist for ADLs.  Utilized Rollator for mobility prior to most recent hospitalization  PATIENT GOALS: pt agrees that he wants to work on moving better, get stronger.  OBJECTIVE:    TODAY'S TREATMENT 07/24/23  TherEx  Nustep L4 x8 min total with rest breaks PRN (3 rest breaks total, often stopped briefly and asked PT "what time is it" on Nustep) Gait: 169ft, 16ft, 24ft RW Mod cues for safety and pacing  Standing marches no UEs x10 with  modA/strong posterior lean, maxA on second attempt  Refused multiple offers of activities from PT today- "I'm not coming to do all that and throw things" Refused obstacle course at EOS- firmly stated "I'm not doing any more today"  TODAY'S TREATMENT: 07/17/23 Activity Comments  Nustep L4 x 5 min  Taking ~3 sitting rest breaks d/t fatigue ; required cueing for pacing   Stand pivot transfer w/c to nustep and mat <>w/c Min A for balance and set up  STS from mat 10x with RW in front  Cueing for safe hand placement, standing tall, pacing    Standing ring toss game in standing with RW in front  Min A for stability, cueing for safety, reaching back for mat to sit down   gait training with RW 2x167ft Cueing required to avoid looking down, keeping feet inside the RW, slowing down           PATIENT EDUCATION: Education details: educated caregiver on activities that were safe to perform at group home; advised okay to perform gait training at group home depending on each individual caregiver's comfort level  Person educated: Patient and Caregiver [place]  Education method: Explanation Education comprehension: verbalized understanding   HOME EXERCISE PROGRAM: Access Code: [email or web address] URL: [email or web address] Date: 04/25/2023 Prepared by: [facility]  Rehab - [facility]  Exercises - Seated March  - 1 x daily - 5 x weekly - 2 sets - 10 reps - Seated Long Arc Quad  - 1 x daily - 5 x weekly - 2 sets - 10 reps - Seated Hip Adduction Isometrics with Ball  - 1 x daily - 5 x weekly - 2 sets - 10 reps - 3 sec hold - Sit to Stand with Counter Support  - 1 x daily - 5 x weekly - 2 sets - 10 reps    ____________________________________________________________________________________________________________________________________ Below measures were taken at time of initial evaluation unless otherwise specified: DIAGNOSTIC FINDINGS: 04/10/23 brain MRI: No acute  intracranial process. Unchanged resection cavity with surrounding encephalomalacia and gliosis in the right frontal lobe. COGNITION: Overall cognitive status: History of cognitive impairments - at baseline SENSATION: Intact in B Les to light touch COORDINATION: Alternating toe tap: pt lifting R and L LE with hip flexors rather than DF; limited motion on L MUSCLE TONE: appears intact in L LE but limited by cognition/understanding on R LE d/t tendency to try to perform AROM POSTURE: posterior pelvic tilt and rounded spine in w/c, head bend downward and to the R LOWER EXTREMITY MMT:    MMT Right Eval RIGHT 07/03/2023 Left Eval LEFT 07/03/2023  Hip flexion 4 4 3  3+  Hip extension      Hip abduction 4 4 2  3-  Hip adduction 4 4 2  3-  Hip internal rotation      Hip external rotation      Knee flexion 4+ 4+ 1 2  Knee extension 4+ 4+ 4- 4  Ankle dorsiflexion 4  0 (unable to elicit contraction but unsure if limited by cognition)   Ankle plantarflexion 4  0  (unable to elicit contraction but unsure if limited by cognition)   Ankle inversion      Ankle eversion       (Blank rows = not tested) LOWER EXTREMITY MMT:   AROM Right Eval Left Eval  Hip flexion    Hip extension    Hip abduction    Hip adduction    Hip internal rotation    Hip external rotation    Knee flexion    Knee extension    Ankle dorsiflexion 6 -15  Ankle plantarflexion    Ankle inversion    Ankle eversion    (Blank rows = not tested) TRANSFERS: Assistive  device utilized: Environmental consultant - 2 wheeled  Sit to stand: Min A and x2 Stand to sit: Min A and x2 Pt able to perform marching with R LE but unable to move L LE; required assist in managing L LE on RW GAIT: Gait pattern:  pt unable today Trialed self-w/c propulsion with R UE but with limited ability and path deviation  GOALS: Goals reviewed with patient? Yes  SHORT TERM GOALS: Target date: 05/16/2023  Patient to perform initial HEP with caregiver assist. Baseline:  HEP initiated Goal status: has been prescribed-- unsure of compliance with group home 05/29/23  LONG TERM GOALS: Target date: 06/06/2023>08/09/2023 UPDATED LTG DATE  Patient to perform advanced HEP with caregiver assist. Baseline: HEP has been initiated, caregiver reports some performance of HEP Goal status: NOT YET FULLY METIN PROGRESS 07/03/2023  Patient to demonstrate L LE strength >/=3/5.  Baseline: See above, progressing Goal status: PARTIALLY MET, IN PROGRESS 07/03/2023  Patient to demonstrate improved head, pelvic, and spinal position in w/c,  Baseline: rounded posture, head drooping down 07/03/2023 Goal status: NOT MET, 07/03/2023  Patient to perform sit to stand transfer with supervision and LRAD.   Baseline: min A with RW, 07/03/2023 Goal status: NOT YET MET, IN PROGRESS 07/03/2023  Patient to ambulate 30 feet with LRAD and CGA. (REVISED GOAL:  Pt to ambulate 200 ft with LRAD and CGA) Baseline: 215 ft with RW/platform RW  with min/mod assist Goal status: PARTIAL MET, REVISED 07/03/2023  Pt will stand 30-60 seconds with intermittent UE support and CGA for ease of clothing management and toileting. Baseline:  10-30 sec with min assist and intermittent UE support Goal status:  IN PROGRESS  ASSESSMENT: CLINICAL IMPRESSION:  Jeannette arrives today with caregiver present, who stayed and observed/assisted with session PRN. Started on Nustep with rest breaks provided PRN and longer overall time performed today. Otherwise worked on strength and balance as able and to degree patient was agreeable- refused bean bag toss game and use of weights as well as other activities offered by PT today, perseverated on walking this session. Gait mechanics worsened significantly as fatigue increased.  Poor awareness of general proprioception and safety, very impulsive today but did better slowing down gait. Will continue efforts. Ended session early today, Kaynan stated "I've had enough" and did not want  to participate in further activity despite multiple offers from PT.   OBJECTIVE IMPAIRMENTS: Abnormal gait, decreased activity tolerance, decreased balance, decreased mobility, difficulty walking, decreased ROM, decreased strength, impaired flexibility, improper body mechanics, and postural dysfunction.   PLAN:  PT FREQUENCY: 1x/week  PT DURATION: 6 weeks per recert 07/03/2023  PLANNED INTERVENTIONS: Therapeutic exercises, Therapeutic activity, Neuromuscular re-education, Balance training, Gait training, Patient/Family education, Self Care, Joint mobilization, Stair training, Vestibular training, Canalith repositioning, Orthotic/Fit training, DME instructions, Aquatic Therapy, Dry Needling, Electrical stimulation, Cryotherapy, Moist heat, Taping, Manual therapy, and Re-evaluation  PLAN FOR NEXT SESSION: Try to update HEP, discuss/make recommendation about platform RW versus hand position assist for LUE to assist with RW management.  Continue STS transfers at [facility] or parallel bars, gait and balance training, wt shifting onto LLE   Nedra Hai, PT, DPT 07/24/23 2:41 PM  [facility] at [facility] [address] [place], [address] [place], [place] [address] Phone # [phone] Fax # [phone]

## 2023-07-31 ENCOUNTER — Encounter: Payer: Medicare Other | Admitting: Occupational Therapy

## 2023-07-31 ENCOUNTER — Ambulatory Visit: Payer: Medicare Other | Attending: Internal Medicine

## 2023-07-31 DIAGNOSIS — R262 Difficulty in walking, not elsewhere classified: Secondary | ICD-10-CM | POA: Insufficient documentation

## 2023-07-31 DIAGNOSIS — R293 Abnormal posture: Secondary | ICD-10-CM | POA: Insufficient documentation

## 2023-07-31 DIAGNOSIS — R2681 Unsteadiness on feet: Secondary | ICD-10-CM | POA: Insufficient documentation

## 2023-07-31 DIAGNOSIS — M6281 Muscle weakness (generalized): Secondary | ICD-10-CM | POA: Insufficient documentation

## 2023-07-31 DIAGNOSIS — I69354 Hemiplegia and hemiparesis following cerebral infarction affecting left non-dominant side: Secondary | ICD-10-CM | POA: Insufficient documentation

## 2023-07-31 DIAGNOSIS — R2689 Other abnormalities of gait and mobility: Secondary | ICD-10-CM | POA: Insufficient documentation

## 2023-08-08 ENCOUNTER — Encounter: Payer: Medicare Other | Admitting: Occupational Therapy

## 2023-08-08 ENCOUNTER — Ambulatory Visit: Payer: Medicare Other

## 2023-08-08 DIAGNOSIS — R262 Difficulty in walking, not elsewhere classified: Secondary | ICD-10-CM

## 2023-08-08 DIAGNOSIS — I69354 Hemiplegia and hemiparesis following cerebral infarction affecting left non-dominant side: Secondary | ICD-10-CM | POA: Diagnosis present

## 2023-08-08 DIAGNOSIS — R2689 Other abnormalities of gait and mobility: Secondary | ICD-10-CM

## 2023-08-08 DIAGNOSIS — R293 Abnormal posture: Secondary | ICD-10-CM | POA: Diagnosis present

## 2023-08-08 DIAGNOSIS — R2681 Unsteadiness on feet: Secondary | ICD-10-CM | POA: Diagnosis present

## 2023-08-08 DIAGNOSIS — M6281 Muscle weakness (generalized): Secondary | ICD-10-CM | POA: Diagnosis present

## 2023-08-08 NOTE — Therapy (Signed)
OUTPATIENT PHYSICAL THERAPY NEURO TREATMENT, Progress note, and Recertification  Patient Name: [name] MRN: [id number] DOB:1956/06/02, 67 y.o., male Today's Date: 08/08/2023  PCP: Lucretia Field REFERRING PROVIDER: Rolly Salter, MD    Progress Note Reporting Period 07/03/23 to 08/08/23  See note below for Objective Data and Assessment of Progress/Goals.     END OF SESSION:  PT End of Session - 08/08/23 1401     Visit Number 11    Number of Visits 17    Date for PT Re-Evaluation 09/19/23    Authorization Type Medicare/Medicaid    Progress Note Due on Visit 21    PT Start Time 1400    PT Stop Time 1445    PT Time Calculation (min) 45 min    Equipment Utilized During Treatment Gait belt    Activity Tolerance Patient tolerated treatment well    Behavior During Therapy Impulsive;Flat affect               Past Medical History:  Diagnosis Date   Barrett esophagus    DM II (diabetes mellitus, type II), controlled (HCC) 11/13/2020   Dysphagia    Gastroparesis    GERD (gastroesophageal reflux disease)    Hypercholesteremia    Hypertension    Iron deficiency anemia    Mental retardation    Schizophrenia (HCC)    Seizure (HCC)    Stroke [facility])    Past Surgical History:  Procedure Laterality Date   BIOPSY  10/21/2020   Procedure: BIOPSY;  Surgeon: Iva Boop, MD;  Location: [facility] ENDOSCOPY;  Service: Endoscopy;;   CRANIOTOMY FOR CYST FENESTRATION     ESOPHAGOGASTRODUODENOSCOPY N/A 01/09/2015   Procedure: ESOPHAGOGASTRODUODENOSCOPY (EGD);  Surgeon: Louis Meckel, MD;  Location: [facility] ENDOSCOPY;  Service: Endoscopy;  Laterality: N/A;   ESOPHAGOGASTRODUODENOSCOPY N/A 10/26/2020   Procedure: ESOPHAGOGASTRODUODENOSCOPY (EGD);  Surgeon: Corliss Skains, MD;  Location: [facility] OR;  Service: Thoracic;  Laterality: N/A;   ESOPHAGOGASTRODUODENOSCOPY (EGD) WITH PROPOFOL N/A 10/21/2020   Procedure: ESOPHAGOGASTRODUODENOSCOPY (EGD) WITH PROPOFOL;  Surgeon:  Iva Boop, MD;  Location: [facility] ENDOSCOPY;  Service: Endoscopy;  Laterality: N/A;   IR GASTR TUBE CONVERT GASTR-JEJ PER W/FL MOD SED  11/07/2020   PEG PLACEMENT N/A 10/26/2020   Procedure: PERCUTANEOUS ENDOSCOPIC GASTROSTOMY (PEG) PLACEMENT;  Surgeon: Corliss Skains, MD;  Location: [facility] OR;  Service: Thoracic;  Laterality: N/A;   XI ROBOTIC ASSISTED HIATAL HERNIA REPAIR N/A 10/26/2020   Procedure: XI ROBOTIC ASSISTED HIATAL HERNIA REPAIR;  Surgeon: Corliss Skains, MD;  Location: [facility] OR;  Service: Thoracic;  Laterality: N/A;   Patient Active Problem List   Diagnosis Date Noted   Polypharmacy 06/26/2023   Stroke-like symptoms 06/05/2023   Mixed hyperlipidemia 05/26/2023   Intellectual developmental delay 05/26/2023   General weakness 04/09/2023   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) 10/26/2021   Seizure disorder (HCC) 10/26/2021   S/P craniotomy 03/09/2021   Dilation of esophagus    Hyponatremia    Dysphagia    Seizures (HCC)    S/P percutaneous endoscopic gastrostomy (PEG) tube placement (HCC)    Weakness 11/15/2020   Bowel obstruction (HCC)    S/P repair of paraesophageal hernia 10/26/2020   Syncope 10/21/2020   Hiatal hernia 10/21/2020   Acute hypoxemic respiratory failure (HCC) 10/21/2020   Sinus tachycardia 10/21/2020   Gastric polyps    Altered mental status 09/05/2020   Emesis 07/24/2015   Coffee ground emesis    Gastrointestinal  hemorrhage associated with other gastritis    Acute esophagitis 01/09/2015   Upper gastrointestinal bleed    Diarrhea    Nausea with vomiting    GI bleed 01/08/2015   GIB (gastrointestinal bleeding) 01/08/2015   Nausea & vomiting 01/08/2015   Reflux esophagitis 07/04/2009   Loss of weight 07/04/2009   BLOOD IN STOOL, OCCULT 07/04/2009   ANEMIA, IRON DEFICIENCY 05/04/2008   Hypothyroidism 04/05/2008   Schizophrenia, unspecified type (HCC) 04/05/2008   Depression 04/05/2008    Hemiplegia (HCC) 04/05/2008   Essential hypertension 04/05/2008   GERD 04/05/2008   Gastroparesis 04/05/2008   Acute metabolic encephalopathy 04/05/2008   History of diverticulosis 08/21/2004   Barrett's esophagus 04/01/2002   HIATAL HERNIA 04/01/2002    ONSET DATE: 04/09/23 REFERRING DIAG: R53.1 (ICD-10-CM) - Left-sided weakness G40.919 (ICD-10-CM) - Breakthrough seizure (HCC) THERAPY DIAG:  Muscle weakness (generalized)  Hemiplegia and hemiparesis following cerebral infarction affecting left non-dominant side (HCC)  Unsteadiness on feet  Other abnormalities of gait and mobility  Abnormal posture  Difficulty in walking, not elsewhere classified  Rationale for Evaluation and Treatment: Rehabilitation  SUBJECTIVE:                                                                                                                                                                                             SUBJECTIVE STATEMENT:  Doing well.  Walking more at home, no falls, no assistance needed  Pt accompanied by: self, caregiver -[name]   PERTINENT HISTORY: DMII, GERD, HLD, HTN, anemia, schizophrenia, seizure, stroke with L hemiparesis, benign intracranial tumor s/p craniotomy 2004, dysphagia, cognitive deficit   PAIN:  Are you having pain? No 0/10  PRECAUTIONS: Fall and Other: seizures  WEIGHT BEARING RESTRICTIONS: No  FALLS: Has patient fallen in last 6 months? No  LIVING ENVIRONMENT: Per OT's note as caregiver not present for PT session:  Lives with: lives in an adult home Lives in: House/apartment Stairs: No Has following equipment at home: Dan Humphreys - 4 wheeled, Wheelchair (power), shower chair, and Grab bars   PLOF:  Mod I for toileting, but Min-mod assist for ADLs.  Utilized Rollator for mobility prior to most recent hospitalization  PATIENT GOALS: pt agrees that he wants to work on moving better, get stronger.  OBJECTIVE:   TODAY'S TREATMENT: 08/08/23 Activity  Comments  LTG review See below for details/results  Berg Balance Test 30/56  HEP review and refinement Addition of seated hip abd w/ resist. Addition of resistance to remaining  Gait training Parallel bars w/ CGA forwards/backwards x 8 lengths with cues for LLE limb control for extension.  Gait on level surfaces w/  CGA without AD                PATIENT EDUCATION: Education details: educated caregiver on activities that were safe to perform at group home; advised okay to perform gait training at group home depending on each individual caregiver's comfort level  Person educated: Patient and Caregiver Lukas  Education method: Explanation Education comprehension: verbalized understanding   HOME EXERCISE PROGRAM: Access Code: [email or web address] URL: [email or web address] Date: 04/25/2023 Prepared by: [facility] - Outpatient  Rehab - [facility]  Exercises - Seated March with Resistance  - 1 x daily - 7 x weekly - 3 sets - 10 reps - Seated Knee Extension with Resistance  - 1 x daily - 7 x weekly - 3 sets - 10 reps - Seated Hip Abduction with Resistance  - 1 x daily - 7 x weekly - 3 sets - 10 reps - Seated Hip Adduction Isometrics with Ball  - 1 x daily - 5 x weekly - 2 sets - 10 reps - 3 sec hold - Sit to Stand with Counter Support  - 1 x daily - 5 x weekly - 2 sets - 10 reps    ____________________________________________________________________________________________________________________________________ Below measures were taken at time of initial evaluation unless otherwise specified: DIAGNOSTIC FINDINGS: 04/10/23 brain MRI: No acute intracranial process. Unchanged resection cavity with surrounding encephalomalacia and gliosis in the right frontal lobe. COGNITION: Overall cognitive status: History of cognitive impairments - at baseline SENSATION: Intact in B Les to light touch COORDINATION: Alternating toe tap: pt lifting R and L LE with hip flexors rather than  DF; limited motion on L MUSCLE TONE: appears intact in L LE but limited by cognition/understanding on R LE d/t tendency to try to perform AROM POSTURE: posterior pelvic tilt and rounded spine in w/c, head bend downward and to the R LOWER EXTREMITY MMT:    MMT Right Eval RIGHT 07/03/2023 Left Eval LEFT 07/03/2023 LEFT 08/08/23  Hip flexion 4 4 3  3+ 3+  Hip extension       Hip abduction 4 4 2  3- 3  Hip adduction 4 4 2  3- 3  Hip internal rotation       Hip external rotation       Knee flexion 4+ 4+ 1 2 3   Knee extension 4+ 4+ 4- 4 4+  Ankle dorsiflexion 4  0 (unable to elicit contraction but unsure if limited by cognition)  trace  Ankle plantarflexion 4  0  (unable to elicit contraction but unsure if limited by cognition)  trace  Ankle inversion       Ankle eversion        (Blank rows = not tested) LOWER EXTREMITY MMT:   AROM Right Eval Left Eval  Hip flexion    Hip extension    Hip abduction    Hip adduction    Hip internal rotation    Hip external rotation    Knee flexion    Knee extension    Ankle dorsiflexion 6 -15  Ankle plantarflexion    Ankle inversion    Ankle eversion    (Blank rows = not tested) TRANSFERS: Assistive device utilized: Environmental consultant - 2 wheeled  Sit to stand: Min A and x2 Stand to sit: Min A and x2 Pt able to perform marching with R LE but unable to move L LE; required assist in managing L LE on RW GAIT: Gait pattern:  pt unable today Trialed self-w/c propulsion with R UE but with limited ability and path  deviation  GOALS: Goals reviewed with patient? Yes  SHORT TERM GOALS: Target date: 08/29/2023    Patient to perform updated HEP with caregiver assist. Baseline: HEP refined for PRE Goal status: IN PROGRESS  LONG TERM GOALS: Target date: 09/19/2023    Patient to perform advanced HEP with caregiver assist. Baseline: HEP has been initiated and updated Goal status: IN PROGRESS  Patient to demonstrate L LE strength >/=4/5.  Baseline: See above,  progressing Goal status: REVISED/IN PROGRESS  Patient to demonstrate improved head, pelvic, and spinal position in w/c,  Baseline: upright, midline Goal status: MET  Patient to perform sit to stand transfer with supervision and LRAD.   Baseline: (08/08/23) SBA-CGA Goal status: IN PROGRESS   Patient to ambulate 150 ft with cane and SBA on level surfaces to improve safety and efficiency with ambulation Baseline: 200 ft SBA-CGA w/ RW Goal status: REVISED  Pt will stand 30-60 seconds with intermittent UE support and CGA for ease of clothing management and toileting. Baseline:  2 min with supervision Goal status:  MET  Patient will reduce risk for falls per score 40/56 Berg Balance Test Baseline: 30/56 Goal status: INITIAL  ASSESSMENT: CLINICAL IMPRESSION: Review of POC details demonstrating marked improvements in functional mobility and LLE strength meeting 4/5 LTG and able to perform items for [facility] Test with score of 30/56 indicating high risk for falls.  Remains rather impulsive in his movements and behaviors requiring assist for correction and redirection.  Gait training has significantly improved with use of RW and ambulating level surfaces w/ SBA-CGA with left wrist flexor tone limiting practical grip on walker.  Therapist requested several times for trial of cane with quad tip cane but pt resistant to idea at this time.  He was able to ambulate with good stability on level surfaces with CGA and sans RW.  Improved LLE strength evident via manual muscle test with prevailing weakness in proximal hip with HEP revised and updated with use of green resistance loop to progress these areas with caregiver voicing understanding and new instructions provided.  Patient is progressing quite well with POC details and would likely benefit to train/instruct in ambulation with less restrictive AD (cane) to promote greater independence and safety in group home environment.  These details have been  communicated to patient's POA for discussion and review.  OBJECTIVE IMPAIRMENTS: Abnormal gait, decreased activity tolerance, decreased balance, decreased mobility, difficulty walking, decreased ROM, decreased strength, impaired flexibility, improper body mechanics, and postural dysfunction.   PLAN:  PT FREQUENCY: 1x/week  PT DURATION: 6 weeks per recert 08/08/23  PLANNED INTERVENTIONS: Therapeutic exercises, Therapeutic activity, Neuromuscular re-education, Balance training, Gait training, Patient/Family education, Self Care, Joint mobilization, Stair training, Vestibular training, Canalith repositioning, Orthotic/Fit training, DME instructions, Aquatic Therapy, Dry Needling, Electrical stimulation, Cryotherapy, Moist heat, Taping, Manual therapy, and Re-evaluation  PLAN FOR NEXT SESSION: review new HEP updates (using green t-loop). Ambulation with cane if amenable, trials without AD. Facilitation of left hip extension  3:06 PM, 08/08/23 M. Shary Decamp, PT, DPT Physical Therapist- [place] Office Number: [phone]

## 2023-09-03 NOTE — Therapy (Signed)
OUTPATIENT PHYSICAL THERAPY NEURO TREATMENT  Patient Name: [name] MRN: [id number] DOB:[birth year], 67 y.o., male Today's Date: 09/04/2023  PCP: Lucretia Field REFERRING PROVIDER: Rolly Salter, MD       END OF SESSION:  PT End of Session - 09/04/23 1101     Visit Number 12    Number of Visits 17    Date for PT Re-Evaluation 09/19/23    Authorization Type Medicare/Medicaid    Progress Note Due on Visit 21    PT Start Time 1018    PT Stop Time 1058    PT Time Calculation (min) 40 min    Equipment Utilized During Treatment Gait belt    Activity Tolerance Patient tolerated treatment well    Behavior During Therapy Impulsive;Lability                Past Medical History:  Diagnosis Date   Barrett esophagus    DM II (diabetes mellitus, type II), controlled (HCC) 11/13/2020   Dysphagia    Gastroparesis    GERD (gastroesophageal reflux disease)    Hypercholesteremia    Hypertension    Iron deficiency anemia    Mental retardation    Schizophrenia (HCC)    Seizure (HCC)    Stroke [facility])    Past Surgical History:  Procedure Laterality Date   BIOPSY  10/21/2020   Procedure: BIOPSY;  Surgeon: Iva Boop, MD;  Location: [facility] ENDOSCOPY;  Service: Endoscopy;;   CRANIOTOMY FOR CYST FENESTRATION     ESOPHAGOGASTRODUODENOSCOPY N/A 01/09/2015   Procedure: ESOPHAGOGASTRODUODENOSCOPY (EGD);  Surgeon: Louis Meckel, MD;  Location: [facility] ENDOSCOPY;  Service: Endoscopy;  Laterality: N/A;   ESOPHAGOGASTRODUODENOSCOPY N/A 10/26/2020   Procedure: ESOPHAGOGASTRODUODENOSCOPY (EGD);  Surgeon: Corliss Skains, MD;  Location[facility] OR;  Service: Thoracic;  Laterality: N/A;   ESOPHAGOGASTRODUODENOSCOPY (EGD) WITH PROPOFOL N/A 10/21/2020   Procedure: ESOPHAGOGASTRODUODENOSCOPY (EGD) WITH PROPOFOL;  Surgeon: Iva Boop, MD;  Location: [facility] ENDOSCOPY;  Service: Endoscopy;  Laterality: N/A;   IR GASTR TUBE CONVERT GASTR-JEJ PER W/FL MOD SED  11/07/2020   PEG PLACEMENT  N/A 10/26/2020   Procedure: PERCUTANEOUS ENDOSCOPIC GASTROSTOMY (PEG) PLACEMENT;  Surgeon: Corliss Skains, MD;  Location: [facility] OR;  Service: Thoracic;  Laterality: N/A;   XI ROBOTIC ASSISTED HIATAL HERNIA REPAIR N/A 10/26/2020   Procedure: XI ROBOTIC ASSISTED HIATAL HERNIA REPAIR;  Surgeon: Corliss Skains, MD;  Location: [facility] OR;  Service: Thoracic;  Laterality: N/A;   Patient Active Problem List   Diagnosis Date Noted   Polypharmacy 06/26/2023   Stroke-like symptoms 06/05/2023   Mixed hyperlipidemia 05/26/2023   Intellectual developmental delay 05/26/2023   General weakness 04/09/2023   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) 10/26/2021   Seizure disorder (HCC) 10/26/2021   S/P craniotomy 03/09/2021   Dilation of esophagus    Hyponatremia    Dysphagia    Seizures (HCC)    S/P percutaneous endoscopic gastrostomy (PEG) tube placement (HCC)    Weakness 11/15/2020   Bowel obstruction (HCC)    S/P repair of paraesophageal hernia 10/26/2020   Syncope 10/21/2020   Hiatal hernia 10/21/2020   Acute hypoxemic respiratory failure (HCC) 10/21/2020   Sinus tachycardia 10/21/2020   Gastric polyps    Altered mental status 09/05/2020   Emesis 07/24/2015   Coffee ground emesis    Gastrointestinal hemorrhage associated with other gastritis    Acute esophagitis 01/09/2015   Upper gastrointestinal bleed    Diarrhea  Nausea with vomiting    GI bleed 01/08/2015   GIB (gastrointestinal bleeding) 01/08/2015   Nausea & vomiting 01/08/2015   Reflux esophagitis 07/04/2009   Loss of weight 07/04/2009   BLOOD IN STOOL, OCCULT 07/04/2009   ANEMIA, IRON DEFICIENCY 05/04/2008   Hypothyroidism 04/05/2008   Schizophrenia, unspecified type (HCC) 04/05/2008   Depression 04/05/2008   Hemiplegia (HCC) 04/05/2008   Essential hypertension 04/05/2008   GERD 04/05/2008   Gastroparesis 04/05/2008   Acute metabolic encephalopathy 04/05/2008    History of diverticulosis 08/21/2004   Barrett's esophagus 04/01/2002   HIATAL HERNIA 04/01/2002    ONSET DATE: 04/09/23 REFERRING DIAG: R53.1 (ICD-10-CM) - Left-sided weakness G40.919 (ICD-10-CM) - Breakthrough seizure (HCC) THERAPY DIAG:  Muscle weakness (generalized)  Hemiplegia and hemiparesis following cerebral infarction affecting left non-dominant side (HCC)  Unsteadiness on feet  Other abnormalities of gait and mobility  Rationale for Evaluation and Treatment: Rehabilitation  SUBJECTIVE:                                                                                                                                                                                             SUBJECTIVE STATEMENT:  Doing well.  Went to church. Caregiver reports that pt refuses performing HEP at home however has been able to walk to the bathroom without AD with some caregiver assistance.   Pt accompanied by: self, caregiver -Rasheeda   PERTINENT HISTORY: DMII, GERD, HLD, HTN, anemia, schizophrenia, seizure, stroke with L hemiparesis, benign intracranial tumor s/p craniotomy 2004, dysphagia, cognitive deficit   PAIN:  Are you having pain? No 0/10  PRECAUTIONS: Fall and Other: seizures  WEIGHT BEARING RESTRICTIONS: No  FALLS: Has patient fallen in last 6 months? No  LIVING ENVIRONMENT: Per OT's note as caregiver not present for PT session:  Lives with: lives in an adult home Lives in: House/apartment Stairs: No Has following equipment at home: Dan Humphreys - 4 wheeled, Wheelchair (power), shower chair, and Grab bars   PLOF:  Mod I for toileting, but Min-mod assist for ADLs.  Utilized Rollator for mobility prior to most recent hospitalization  PATIENT GOALS: pt agrees that he wants to work on moving better, get stronger.  OBJECTIVE:     TODAY'S TREATMENT: 09/04/23 Activity Comments  review new HEP updates (using green t-loop): sitting march 10x  sitting LAQ 10x sitting hip ABD 10x   ball squeeze 10x3" STS x5 with RW  Limited L hip ABD with clams, requiring cueing. Also difficulty eliciting L knee extension with LAQ, requiring AAROM.   Min-mod A required to safely sit back into chair with STS and maintain safety as patient very impulsive.  Gait training with RW 2x168ft CGA-min A for safety, pacing, maneuvering around objects. PT pushing down through L side of walker to assist in stability. Pt fast and impulsive and requires frequent redirection for safety   Attempted short gait with quad cane Discontinued d/t pt very unsteady and did not listen to direction   STS from mat with RW x5 Tendency to heavily lean against back of mat and fall back into mat; cues to love self safely            PATIENT EDUCATION: Education details: discussion with caregiver about patient's demeanor/behavior recently  Person educated: Caregiver Rasheeda Education method: Explanation Education comprehension: verbalized understanding   HOME EXERCISE PROGRAM: Access Code: [email or web address] URL: [email or web address] Date: 04/25/2023 Prepared by: [facility] - Outpatient  Rehab - [facility]  Exercises - Seated March with Resistance  - 1 x daily - 7 x weekly - 3 sets - 10 reps - Seated Knee Extension with Resistance  - 1 x daily - 7 x weekly - 3 sets - 10 reps - Seated Hip Abduction with Resistance  - 1 x daily - 7 x weekly - 3 sets - 10 reps - Seated Hip Adduction Isometrics with Ball  - 1 x daily - 5 x weekly - 2 sets - 10 reps - 3 sec hold - Sit to Stand with Counter Support  - 1 x daily - 5 x weekly - 2 sets - 10 reps    ____________________________________________________________________________________________________________________________________ Below measures were taken at time of initial evaluation unless otherwise specified: DIAGNOSTIC FINDINGS: 04/10/23 brain MRI: No acute intracranial process. Unchanged resection cavity with surrounding encephalomalacia and gliosis  in the right frontal lobe. COGNITION: Overall cognitive status: History of cognitive impairments - at baseline SENSATION: Intact in B Les to light touch COORDINATION: Alternating toe tap: pt lifting R and L LE with hip flexors rather than DF; limited motion on L MUSCLE TONE: appears intact in L LE but limited by cognition/understanding on R LE d/t tendency to try to perform AROM POSTURE: posterior pelvic tilt and rounded spine in w/c, head bend downward and to the R LOWER EXTREMITY MMT:    MMT Right Eval RIGHT 07/03/2023 Left Eval LEFT 07/03/2023 LEFT 08/08/23  Hip flexion 4 4 3  3+ 3+  Hip extension       Hip abduction 4 4 2  3- 3  Hip adduction 4 4 2  3- 3  Hip internal rotation       Hip external rotation       Knee flexion 4+ 4+ 1 2 3   Knee extension 4+ 4+ 4- 4 4+  Ankle dorsiflexion 4  0 (unable to elicit contraction but unsure if limited by cognition)  trace  Ankle plantarflexion 4  0  (unable to elicit contraction but unsure if limited by cognition)  trace  Ankle inversion       Ankle eversion        (Blank rows = not tested) LOWER EXTREMITY MMT:   AROM Right Eval Left Eval  Hip flexion    Hip extension    Hip abduction    Hip adduction    Hip internal rotation    Hip external rotation    Knee flexion    Knee extension    Ankle dorsiflexion 6 -15  Ankle plantarflexion    Ankle inversion    Ankle eversion    (Blank rows = not tested) TRANSFERS: Assistive device utilized: Environmental consultant - 2 wheeled  Sit to stand: Min A and  x2 Stand to sit: Min A and x2 Pt able to perform marching with R LE but unable to move L LE; required assist in managing L LE on RW GAIT: Gait pattern:  pt unable today Trialed self-w/c propulsion with R UE but with limited ability and path deviation  GOALS: Goals reviewed with patient? Yes  SHORT TERM GOALS: Target date: 08/29/2023    Patient to perform updated HEP with caregiver assist. Baseline: HEP refined for PRE Goal status: IN  PROGRESS  LONG TERM GOALS: Target date: 09/19/2023    Patient to perform advanced HEP with caregiver assist. Baseline: HEP has been initiated and updated Goal status: IN PROGRESS  Patient to demonstrate L LE strength >/=4/5.  Baseline: See above, progressing Goal status: REVISED/IN PROGRESS  Patient to demonstrate improved head, pelvic, and spinal position in w/c,  Baseline: upright, midline Goal status: MET  Patient to perform sit to stand transfer with supervision and LRAD.   Baseline: (08/08/23) SBA-CGA Goal status: IN PROGRESS   Patient to ambulate 150 ft with cane and SBA on level surfaces to improve safety and efficiency with ambulation Baseline: 200 ft SBA-CGA w/ RW Goal status: REVISED  Pt will stand 30-60 seconds with intermittent UE support and CGA for ease of clothing management and toileting. Baseline:  2 min with supervision Goal status:  MET  Patient will reduce risk for falls per score 40/56 Berg Balance Test Baseline: 30/56 Goal status: INITIAL  ASSESSMENT: CLINICAL IMPRESSION: Patient arrived to session with caregiver without complaints. Caregiver reports that pt refuses performing HEP at home however has been able to walk to the bathroom without AD with some caregiver assistance. Reviewed HEP which required some cueing and AAROM to encourage increased L LE. Gait training with RW required heavy verbal and manual cueing for safety. In general, patient is more impulsive and speech is more difficult to understand today. Upon discussion with caregiver, she reports that patient has been hitting caregivers when they try to assist him and has been talking to himself more. This was somewhat evident during session as patient was talking to himself, laughing inappropriately, and spitting. Will message pt's PCP about change in behavior and speech.   OBJECTIVE IMPAIRMENTS: Abnormal gait, decreased activity tolerance, decreased balance, decreased mobility, difficulty walking,  decreased ROM, decreased strength, impaired flexibility, improper body mechanics, and postural dysfunction.   PLAN:  PT FREQUENCY: 1x/week  PT DURATION: 6 weeks per recert 08/08/23  PLANNED INTERVENTIONS: Therapeutic exercises, Therapeutic activity, Neuromuscular re-education, Balance training, Gait training, Patient/Family education, Self Care, Joint mobilization, Stair training, Vestibular training, Canalith repositioning, Orthotic/Fit training, DME instructions, Aquatic Therapy, Dry Needling, Electrical stimulation, Cryotherapy, Moist heat, Taping, Manual therapy, and Re-evaluation  PLAN FOR NEXT SESSION: continue gait training with RW, trials without AD. Facilitation of left hip extension   Anette Guarneri, PT, DPT 09/04/23 11:04 AM  [facility] at [facility] [address], [address] [place], [place] [address] Phone # [phone] Fax # [phone]

## 2023-09-04 ENCOUNTER — Ambulatory Visit: Payer: Medicare Other | Attending: Internal Medicine | Admitting: Physical Therapy

## 2023-09-04 ENCOUNTER — Encounter: Payer: Self-pay | Admitting: Physical Therapy

## 2023-09-04 ENCOUNTER — Telehealth: Payer: Self-pay | Admitting: Physical Therapy

## 2023-09-04 DIAGNOSIS — R2689 Other abnormalities of gait and mobility: Secondary | ICD-10-CM | POA: Diagnosis present

## 2023-09-04 DIAGNOSIS — M6281 Muscle weakness (generalized): Secondary | ICD-10-CM | POA: Insufficient documentation

## 2023-09-04 DIAGNOSIS — I69354 Hemiplegia and hemiparesis following cerebral infarction affecting left non-dominant side: Secondary | ICD-10-CM | POA: Diagnosis present

## 2023-09-04 DIAGNOSIS — R2681 Unsteadiness on feet: Secondary | ICD-10-CM | POA: Diagnosis present

## 2023-09-04 NOTE — Telephone Encounter (Signed)
Hi Dr. Terrace Arabia,  I am seeing [name] in OPPT s/p seizures requiring hospitalizations. I am reaching out to you as you were the last provider to see him but will also send this to his PCP Dr. Katina Dung. The patient has a hx of behavioral issues but today I had more difficulty understanding his speech, he had more inappropriate laughter, and was spitting during our session. This is different from his baseline. I wanted to make you aware as slurred speech was one of the causes of his hospitalizations in the past, in case you felt the need to f/u with him. Please advise if any changes to POC.  Thanks,  Anette Guarneri, PT, DPT 09/04/23 12:12 PM  [facility] at [facility] [address], [address] [place], [place] [address] Phone # [phone] Fax # [phone]

## 2023-09-04 NOTE — Telephone Encounter (Signed)
Patient was last seen on June 26, 2023, following his hospital admission in June, no reported recurrent seizure, taking Depakote ER 500 twice a day, lamotrigine 100 mg daily  Level in June: Lamotrigine 8.1, Depakote 49, previously up to 112 on May 25, 2023  Video EEG monitoring in April showed right frontal cortical malfunction, but no epileptiform discharge  He is on adequate antipeptic medications with no reported history of seizure, keep previously scheduled follow-up visit

## 2023-09-11 ENCOUNTER — Ambulatory Visit: Payer: Medicare Other | Admitting: Physical Therapy

## 2023-09-18 ENCOUNTER — Encounter: Payer: Self-pay | Admitting: Physical Therapy

## 2023-09-18 ENCOUNTER — Ambulatory Visit: Payer: Medicare Other | Admitting: Physical Therapy

## 2023-09-18 DIAGNOSIS — M6281 Muscle weakness (generalized): Secondary | ICD-10-CM | POA: Diagnosis not present

## 2023-09-18 DIAGNOSIS — R2681 Unsteadiness on feet: Secondary | ICD-10-CM

## 2023-09-18 DIAGNOSIS — R2689 Other abnormalities of gait and mobility: Secondary | ICD-10-CM

## 2023-09-18 NOTE — Therapy (Unsigned)
OUTPATIENT PHYSICAL THERAPY NEURO TREATMENT/DISCHARGE  Patient Name: [name] MRN: [id number] DOB:[birth year], 67 y.o., male Today's Date: 09/18/2023  PCP: Lucretia Field REFERRING PROVIDER: Rolly Salter, MD  PHYSICAL THERAPY DISCHARGE SUMMARY  Visits from Start of Care: 13  Current functional level related to goals / functional outcomes: See below.  Pt has met 3 of 7 LTGs.   Remaining deficits: Balance, fall risk, gait (all improved from initial)   Education / Equipment: Educated caregiver and pt in HEP   Patient agrees to discharge. Patient goals were partially met. Patient is being discharged due to maximized rehab potential.  He is performing activities in group home with supervision with increased gait and increased transfers compared to initial eval.         END OF SESSION:  PT End of Session - 09/18/23 0956     Visit Number 13    Number of Visits 17    Date for PT Re-Evaluation 09/19/23    Authorization Type Medicare/Medicaid    Progress Note Due on Visit 21    PT Start Time 1005    PT Stop Time 1030    PT Time Calculation (min) 25 min    Equipment Utilized During Treatment Gait belt    Activity Tolerance Patient tolerated treatment well    Behavior During Therapy Impulsive   Refuses participation in further gait, exercises after 25 minutes into session, even with PT/caregiver request                Past Medical History:  Diagnosis Date   Barrett esophagus    DM II (diabetes mellitus, type II), controlled (HCC) 11/13/2020   Dysphagia    Gastroparesis    GERD (gastroesophageal reflux disease)    Hypercholesteremia    Hypertension    Iron deficiency anemia    Mental retardation    Schizophrenia (HCC)    Seizure (HCC)    Stroke [facility])    Past Surgical History:  Procedure Laterality Date   BIOPSY  10/21/2020   Procedure: BIOPSY;  Surgeon: Iva Boop, MD;  Location: [facility] ENDOSCOPY;  Service: Endoscopy;;   CRANIOTOMY FOR CYST  FENESTRATION     ESOPHAGOGASTRODUODENOSCOPY N/A 01/09/2015   Procedure: ESOPHAGOGASTRODUODENOSCOPY (EGD);  Surgeon: Louis Meckel, MD;  Location: [facility] ENDOSCOPY;  Service: Endoscopy;  Laterality: N/A;   ESOPHAGOGASTRODUODENOSCOPY N/A 10/26/2020   Procedure: ESOPHAGOGASTRODUODENOSCOPY (EGD);  Surgeon: Corliss Skains, MD;  Location: [facility] OR;  Service: Thoracic;  Laterality: N/A;   ESOPHAGOGASTRODUODENOSCOPY (EGD) WITH PROPOFOL N/A 10/21/2020   Procedure: ESOPHAGOGASTRODUODENOSCOPY (EGD) WITH PROPOFOL;  Surgeon: Iva Boop, MD;  Location: [facility] ENDOSCOPY;  Service: Endoscopy;  Laterality: N/A;   IR GASTR TUBE CONVERT GASTR-JEJ PER W/FL MOD SED  11/07/2020   PEG PLACEMENT N/A 10/26/2020   Procedure: PERCUTANEOUS ENDOSCOPIC GASTROSTOMY (PEG) PLACEMENT;  Surgeon: Corliss Skains, MD;  Location: [facility] OR;  Service: Thoracic;  Laterality: N/A;   XI ROBOTIC ASSISTED HIATAL HERNIA REPAIR N/A 10/26/2020   Procedure: XI ROBOTIC ASSISTED HIATAL HERNIA REPAIR;  Surgeon: Corliss Skains, MD;  Location: [facility] OR;  Service: Thoracic;  Laterality: N/A;   Patient Active Problem List   Diagnosis Date Noted   Polypharmacy 06/26/2023   Stroke-like symptoms 06/05/2023   Mixed hyperlipidemia 05/26/2023   Intellectual developmental delay 05/26/2023   General weakness 04/09/2023   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) 10/26/2021   Seizure disorder (HCC) 10/26/2021   S/P craniotomy 03/09/2021   Dilation  of esophagus    Hyponatremia    Dysphagia    Seizures (HCC)    S/P percutaneous endoscopic gastrostomy (PEG) tube placement (HCC)    Weakness 11/15/2020   Bowel obstruction (HCC)    S/P repair of paraesophageal hernia 10/26/2020   Syncope 10/21/2020   Hiatal hernia 10/21/2020   Acute hypoxemic respiratory failure (HCC) 10/21/2020   Sinus tachycardia 10/21/2020   Gastric polyps    Altered mental status 09/05/2020   Emesis 07/24/2015   Coffee ground  emesis    Gastrointestinal hemorrhage associated with other gastritis    Acute esophagitis 01/09/2015   Upper gastrointestinal bleed    Diarrhea    Nausea with vomiting    GI bleed 01/08/2015   GIB (gastrointestinal bleeding) 01/08/2015   Nausea & vomiting 01/08/2015   Reflux esophagitis 07/04/2009   Loss of weight 07/04/2009   BLOOD IN STOOL, OCCULT 07/04/2009   ANEMIA, IRON DEFICIENCY 05/04/2008   Hypothyroidism 04/05/2008   Schizophrenia, unspecified type (HCC) 04/05/2008   Depression 04/05/2008   Hemiplegia (HCC) 04/05/2008   Essential hypertension 04/05/2008   GERD 04/05/2008   Gastroparesis 04/05/2008   Acute metabolic encephalopathy 04/05/2008   History of diverticulosis 08/21/2004   Barrett's esophagus 04/01/2002   HIATAL HERNIA 04/01/2002    ONSET DATE: 04/09/23 REFERRING DIAG: R53.1 (ICD-10-CM) - Left-sided weakness G40.919 (ICD-10-CM) - Breakthrough seizure (HCC) THERAPY DIAG:  Muscle weakness (generalized)  Unsteadiness on feet  Other abnormalities of gait and mobility  Rationale for Evaluation and Treatment: Rehabilitation  SUBJECTIVE:                                                                                                                                                                                             SUBJECTIVE STATEMENT: Doing well.  Caregiver reports he's been walking more-to the dining room, to the bathroom.  Did that without device.   Pt accompanied by: self, caregiver -Rasheeda   PERTINENT HISTORY: DMII, GERD, HLD, HTN, anemia, schizophrenia, seizure, stroke with L hemiparesis, benign intracranial tumor s/p craniotomy 2004, dysphagia, cognitive deficit   PAIN:  Are you having pain? No 0/10  PRECAUTIONS: Fall and Other: seizures  WEIGHT BEARING RESTRICTIONS: No  FALLS: Has patient fallen in last 6 months? No  LIVING ENVIRONMENT: Per OT's note as caregiver not present for PT session:  Lives with: lives in an adult  home Lives in: House/apartment Stairs: No Has following equipment at home: Dan Humphreys - 4 wheeled, Wheelchair (power), shower chair, and Grab bars   PLOF:  Mod I for toileting, but Min-mod assist for ADLs.  Utilized Rollator for mobility prior to most recent hospitalization  PATIENT GOALS: pt agrees that he wants to work on moving better, get stronger.  OBJECTIVE:    TODAY'S TREATMENT: 09/18/2023 Activity Comments  Berg Balance test: 27/56 Decreased from 30/56  Sit to stand with min guard, at least 5 reps during session, with quick pace and decreased eccentric control to sit   Gait with RW 130 ft Min guard  Gait with cane Pt refuses any further ambulation today  Attempted review/performance of HEP and pt refuses          MMT: MMT Left Eval LEFT 07/03/2023 LEFT 08/08/23 LEFT 09/18/2023  Hip flexion 3 3+ 3+ 3+  Hip extension      Hip abduction 2 3- 3 3+  Hip adduction 2 3- 3 3+  Hip internal rotation      Hip external rotation      Knee flexion 1 2 3  3+  Knee extension 4- 4 4+ 4  Ankle dorsiflexion 0 (unable to elicit contraction but unsure if limited by cognition)  trace   Ankle plantarflexion 0  (unable to elicit contraction but unsure if limited by cognition)  trace   Ankle inversion      Ankle eversion          PATIENT EDUCATION: Education details: Discussed overall progress with therapy since initial eval.  Discussed balance measures continue to indicate fall risk; discussed POC ending this week and plan for d/c this visit.  Verbally reviewed with caregiver, importance of continued HEP and walking  Person educated: Patient and Caregiver Rasheeda Education method: Explanation Education comprehension: verbalized understanding   HOME EXERCISE PROGRAM: Access Code: [email or web address] URL: [email or web address] Date: 04/25/2023 Prepared by: [facility] - Outpatient  Rehab - [facility]  Exercises - Seated March with Resistance  - 1 x daily - 7 x weekly - 3 sets -  10 reps - Seated Knee Extension with Resistance  - 1 x daily - 7 x weekly - 3 sets - 10 reps - Seated Hip Abduction with Resistance  - 1 x daily - 7 x weekly - 3 sets - 10 reps - Seated Hip Adduction Isometrics with Ball  - 1 x daily - 5 x weekly - 2 sets - 10 reps - 3 sec hold - Sit to Stand with Counter Support  - 1 x daily - 5 x weekly - 2 sets - 10 reps    ____________________________________________________________________________________________________________________________________ Below measures were taken at time of initial evaluation unless otherwise specified: DIAGNOSTIC FINDINGS: 04/10/23 brain MRI: No acute intracranial process. Unchanged resection cavity with surrounding encephalomalacia and gliosis in the right frontal lobe. COGNITION: Overall cognitive status: History of cognitive impairments - at baseline SENSATION: Intact in B Les to light touch COORDINATION: Alternating toe tap: pt lifting R and L LE with hip flexors rather than DF; limited motion on L MUSCLE TONE: appears intact in L LE but limited by cognition/understanding on R LE d/t tendency to try to perform AROM POSTURE: posterior pelvic tilt and rounded spine in w/c, head bend downward and to the R LOWER EXTREMITY MMT:    MMT Right Eval RIGHT 07/03/2023 Left Eval LEFT 07/03/2023 LEFT 08/08/23  Hip flexion 4 4 3  3+ 3+  Hip extension       Hip abduction 4 4 2  3- 3  Hip adduction 4 4 2  3- 3  Hip internal rotation       Hip external rotation       Knee flexion 4+ 4+ 1 2 3   Knee extension 4+ 4+ 4-  4 4+  Ankle dorsiflexion 4  0 (unable to elicit contraction but unsure if limited by cognition)  trace  Ankle plantarflexion 4  0  (unable to elicit contraction but unsure if limited by cognition)  trace  Ankle inversion       Ankle eversion        (Blank rows = not tested) LOWER EXTREMITY MMT:   AROM Right Eval Left Eval  Hip flexion    Hip extension    Hip abduction    Hip adduction    Hip internal rotation     Hip external rotation    Knee flexion    Knee extension    Ankle dorsiflexion 6 -15  Ankle plantarflexion    Ankle inversion    Ankle eversion    (Blank rows = not tested) TRANSFERS: Assistive device utilized: Environmental consultant - 2 wheeled  Sit to stand: Min A and x2 Stand to sit: Min A and x2 Pt able to perform marching with R LE but unable to move L LE; required assist in managing L LE on RW GAIT: Gait pattern:  pt unable today Trialed self-w/c propulsion with R UE but with limited ability and path deviation  GOALS: Goals reviewed with patient? Yes  SHORT TERM GOALS: Target date: 08/29/2023    Patient to perform updated HEP with caregiver assist. Baseline: HEP refined for PRE Goal status: IN PROGRESS  LONG TERM GOALS: Target date: 09/19/2023    Patient to perform advanced HEP with caregiver assist. Baseline: HEP has been initiated and updated Goal status: MET, per caregiver report 09/18/2023  Patient to demonstrate L LE strength >/=4/5.  Baseline: See above  Goal status: NOT MET 09/18/2023  Patient to demonstrate improved head, pelvic, and spinal position in w/c,  Baseline: upright, midline Goal status: MET  Patient to perform sit to stand transfer with supervision and LRAD.   Baseline: (08/08/23) SBA-CGA; 09/18/2003 CGA  Goal status: NOT MET 09/18/2023   Patient to ambulate 150 ft with cane and SBA on level surfaces to improve safety and efficiency with ambulation Baseline: 200 ft SBA-CGA w/ RW; 130 ft today with RW CGA; did not use cane; caregiver reports using no device at home 09/18/2023 Goal status: NOT MET  Pt will stand 30-60 seconds with intermittent UE support and CGA for ease of clothing management and toileting. Baseline:  2 min with supervision Goal status:  MET  Patient will reduce risk for falls per score 40/56 Berg Balance Test Baseline: 30/56; 27/56 09/18/2023 Goal status: NOT MET 09/18/2023  ASSESSMENT: CLINICAL IMPRESSION: Assessed LTGs this visit, with pt  demonstrating improved hip abduction and hip adduction strength with MMT.  Berg score assessed and score decreased from 30/56 to 27/56.  LTG 1 met for HEP per caregiver report.  LTG 3 and 6 met.  LTG 2 not fully met for MMT, LTG 4 not met for sit<>stand, LTG 5 not met for gait with cane and LTG 7 not met for improved Berg score.  In session today, pt does refuse any further gait with cane or no device, after 130 ft of gait with RW.  In general in PT sessions, he is needing CGA for transfers and for gait; caregiver is reporting at home that he is not needing assistance with transfers or gait; she reports he is doing some walking without any device.  Overall, pt has made progress with PT since initial evaluation (04/25/2023), with improved balance, transfers, and gait, as well as caregiver-reported overall improvement with mobility  at home.  Pt has made limited progress in the past month with Berg Balance test and remains safest with min guard/contact guard for mobility for safety.  Plan for discharge from PT at this time. OBJECTIVE IMPAIRMENTS: Abnormal gait, decreased activity tolerance, decreased balance, decreased mobility, difficulty walking, decreased ROM, decreased strength, impaired flexibility, improper body mechanics, and postural dysfunction.   PLAN:  PT FREQUENCY: 1x/week  PT DURATION: 6 weeks per recert 08/08/23  PLANNED INTERVENTIONS: Therapeutic exercises, Therapeutic activity, Neuromuscular re-education, Balance training, Gait training, Patient/Family education, Self Care, Joint mobilization, Stair training, Vestibular training, Canalith repositioning, Orthotic/Fit training, DME instructions, Aquatic Therapy, Dry Needling, Electrical stimulation, Cryotherapy, Moist heat, Taping, Manual therapy, and Re-evaluation  PLAN FOR NEXT SESSION: Discharge PT   Lonia Blood, PT 09/18/23 10:51 AM Phone: [phone] Fax: [phone]   [facility] at [facility] [address], [address] [place], [place] [address] Phone # [phone] Fax # [phone]

## 2023-10-07 ENCOUNTER — Ambulatory Visit (INDEPENDENT_AMBULATORY_CARE_PROVIDER_SITE_OTHER): Payer: Medicare Other | Admitting: Neurology

## 2023-10-07 ENCOUNTER — Encounter: Payer: Self-pay | Admitting: Neurology

## 2023-10-07 VITALS — BP 132/87 | HR 97 | Ht 63.0 in

## 2023-10-07 DIAGNOSIS — I69954 Hemiplegia and hemiparesis following unspecified cerebrovascular disease affecting left non-dominant side: Secondary | ICD-10-CM

## 2023-10-07 DIAGNOSIS — Z9889 Other specified postprocedural states: Secondary | ICD-10-CM | POA: Diagnosis not present

## 2023-10-07 DIAGNOSIS — F209 Schizophrenia, unspecified: Secondary | ICD-10-CM | POA: Diagnosis not present

## 2023-10-07 DIAGNOSIS — G40909 Epilepsy, unspecified, not intractable, without status epilepticus: Secondary | ICD-10-CM | POA: Diagnosis not present

## 2023-10-07 NOTE — Patient Instructions (Signed)
No recent seizures to report Check labs today  Continue Depakote, Lamictal seizure medications Monitor for side effect from higher dose of Seroquel with involuntary movement. Call for any seizures, follow up with [place] in 1 year.

## 2023-10-07 NOTE — Progress Notes (Signed)
Chief Complaint  Patient presents with   Follow-up    Rm 16. Patient here with staff from facility reports doing well at this time.    ASSESSMENT AND PLAN  [name] is a 67 y.o. male   1.  History of benign intracranial tumor surgery, stroke perioperative period of time in 10-17-2003, with residual mild spastic left hemiparesis 2.  Complex partial seizure 3.  Schizophrenia 4.  Polypharmacy treatment  -No recent seizure activity reported on Depakote DR 500 mg twice daily, Lamictal 100 mg at bedtime, being refilled by PCP at group home -Update labs today CBC, CMP, Depakote, Lamictal level -Psychiatry is managing Seroquel, appears was increased 300 mg twice daily, some involuntary movement noted today, recommend close monitoring, follow-up with psychiatry, unclear this is related to higher dose -MRI of the brain showed large right frontal encephalomalacia, no acute abnormality, most recent MRI was on June 05, 2023, -Call for seizure activity, follow-up in 1 year or sooner if needed   DIAGNOSTIC DATA (LABS, IMAGING, TESTING) - I reviewed patient records, labs, notes, testing and imaging myself where available.  CT head without contrast from [facility] in April 2019: Encephalomalacia of right frontal lobe with small vessel disease in the superior right centrum semiovale, stable parietal craniotomy of the right and left superior frontal region,  Echocardiogram December 2021: Ejection fraction 65 to 70%  EEG October 21, 2020, mild to moderate diffuse encephalopathy, no epileptiform discharge   HISTORICAL data  [name] a 67 year old male, seen in request by his primary care PA [name], [name], PA for evaluation of passing out spells, he is accompanied by his sister [name], who is also his power of attorney at today's visit on March 09, 2021.  I reviewed and summarized the referring note.  Past medical history Stroke with residual left side  weakness, Seizure, Intellectual delay Dysphagia, Barrett's esophagus, GERD with gastroparesis, Hypothyroidism, on supplement Hypertension Hyperlipidemia Schizophrenia Diabetes History of Craniotomy,   Patient had a history of craniotomy for benign brain tumor removal in 10-17-03 at [facility], postsurgically, he developed complications, required a second surgery, which is further complicated by stroke, with residual left-sided weakness.  The history is from his sister, I could not find the medical records through epic system  Patient also had lifelong history of schizophrenia, developmentally delayed, intellectual disability, lived with his father, who passed away in October 17, 2007, eventually he was placed at a group home since 2011/10/17,  He began to have seizure following his craniotomy in 10/17/03, was managed by outside neurologist, has been seizure-free for more than 10 years, stable on current medication of Depakote DR 500 mg twice a day, and lamotrigine 100 mg daily  In September 2021, he was found to be confused, altered mental status, hypotension, that was improved with hospital admission, hydration, hospital admission again in November 2021, for aspiration pneumonia, 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, periesophagus hernia, underwent robotic assistant laparoscopy, paraesophageal hernia repair, followed by prolonged rehabilitation,  His condition has gradually stabilized since, during the process, he was noted to have confusion, near syncope episode, fall at nursing home, there was no clinical seizure activity noted  Since the surgery, he has much improved, almost back to his baseline, he has unsteady gait, carry on simple conversation, has good appetite, residual left hemiparesis  UPDATE Oct 26 2021: He is accompanied by his group home staff Almira Bar at today's visit, who has known patient for 1 year, patient is  overall doing well, has back to his baseline, have good  appetite, sleeping well, continue have language difficulty, gait abnormality, no seizure-like activity  UPDate April 01 2023: He was accompanied by group home staff Denice Paradise at visit, who has known him for 2 months, described his difficulty using left side, which was actually present at previous examination, I tried to call his sister, power of attorney [name] without success,  Personally reviewed MRI of the brain September 2022, no acute abnormality, large cystic encephalomalacia at the right frontal lobe,  I reviewed emergency room visit on March 22, 2023,  he was taken to the emergency room for weakness, difficulty using his left side, sister is guardian, filed a complaining of non accidental trauma at a group home, Depakote level was low 43,,    Lab from March 15, 2023, Depakote level was 43, lamotrigine was 8.4, normal thyroid functional test, CBC, CMP,  UPDATE April 23 2023: He is accompanied by group home staff at today's visit, hospital admission on April 22 for worsening left-sided weakness, confusion suspicious for seizure, Depakote was increased from 500 to 750 mg twice a day  Personally reviewed MRI of the brain April 10, 2023, no acute abnormality, resection cavity with surrounding encephalomalacia gliosis at the right frontal lobe  CT angiogram of head and neck showed no large vessel disease  Laboratory evaluations, normal CBC, hemoglobin of  15.4, BMP, calcium of 8.6  Overnight EEG with video April 17 continues 3 to 6 Hz theta delta slowing at the right frontal region, but no epileptiform discharge  Laboratory evaluations in April 2024 normal TSH RPR B12 folic acid B1, A1c HIV, lamotrigine level was 10, Depakote 80  UPDATE July 2024: Hospital admission again in June 04, 2023, group home reported slurred speech, sister also described IV rolling backwards, transient unresponsiveness, confused for 3 days  Laboratory showed worsening creatinine 1.6 from baseline of 1.13,  ammonia level was normal, personally reviewed MRI, right frontal encephalomalacia, no acute abnormality UDS was positive for benzodiazepine, confirmed by blood test, for his [place], there was no record of benzodiazepine use in his medical record  Lab also showed slight elevation of TSH 5.3, lamotrigine level 8.1, Depakote level 49,  Seroquel was decreased from 400 to 200mg  bid since hospital discharge on June 13, remained on Depakote DR 500 mg twice a day lamotrigine 100 mg once a day  He is overall doing better, more alert with the lower dose of Seroquel, no confusion /passing out spell,  Over the past few months, he was noted to have increased gait abnormality, dragging left leg more  Personally reviewed MRI of the brain June 05, 2023, no acute abnormality, right frontal encephalomalacia, moderate small vessel disease  Update October 07, 2023 SS: Doing well at group home, here with aide. No seizure events. Remains on Depakote DR 500 mg BID, Lamictal 100 mg PM, Seroquel 300 mg BID for behaviors. Doing more for himself, dressing, tolieting. Needs help with showering. No falls. Mood has been good. Seroquel was increased by psych few weeks ago, unclear reason? PCP is refilling all medications.   PHYSICAL EXAM   Vitals:   10/07/23 0855 10/07/23 0900  BP: (!) 142/84 132/87  Pulse: (!) 101 97  Height: 5\' 3"  (1.6 m)    Body mass index is 25.69 kg/m.  PHYSICAL EXAMNIATION:  Gen: NAD, conversant, well nourised, well groomed         NEUROLOGICAL EXAM:  MENTAL STATUS: Speech/cognition: Dysarthria, speech is quick, cooperative  on examination, following simple command, provides most history, his age corrects a few things   CRANIAL NERVES: CN II: Visual fields are full to confrontation. Pupils are round equal and briskly reactive to light. CN III, IV, VI: extraocular movement are normal. No ptosis. CN V: Facial sensation is intact to light touch CN VII: Face is symmetric with normal  eye closure  CN VIII: Hearing is normal to causal conversation. CN IX, X: Phonation is normal. CN XI: Head turning and shoulder shrug are intact  MOTOR: Spastic left hemiparesis, antigravity movement of left upper extremity, spasticity left lower extremity, 3/5 movement to left leg. Noted fidgety movement, reposition of hands/rolling wrists  REFLEXES: Hyperreflexia on the left side  SENSORY: Intact bilaterally to the face, arm, legs  COORDINATION: Dysmetria with finger-nose-finger bilaterally, difficulty with the left arm due to spasticity  GAIT/STANCE: Able to stand from seated position with pushoff, gait is spastic, unsteady, quick  REVIEW OF SYSTEMS: Full 14 system review of systems performed and notable only for as above All other review of systems were negative.  ALLERGIES: Allergies  Allergen Reactions   Vancomycin Itching and Other (See Comments)    Unknown-possible hives      HOME MEDICATIONS: Current Outpatient Medications  Medication Sig Dispense Refill   Acetylcysteine (NAC) 500 MG CAPS Take 1 capsule by mouth 2 (two) times daily.     benazepril (LOTENSIN) 5 MG tablet Take 5 mg by mouth daily.     docusate sodium (COLACE) 100 MG capsule Take 1 capsule (100 mg total) by mouth 2 (two) times daily. 10 capsule 0   Emollient (CORN HUSKERS EX) Apply 1 application topically in the morning and at bedtime. For dry skin     lamoTRIgine (LAMICTAL) 100 MG tablet Take 1 tablet (100 mg total) by mouth at bedtime. 90 tablet 3   levothyroxine (SYNTHROID) 175 MCG tablet Take 175 mcg by mouth daily.     pantoprazole (PROTONIX) 40 MG tablet Take 1 tablet (40 mg total) by mouth 2 (two) times daily before a meal. 60 tablet 0   polyethylene glycol (MIRALAX / GLYCOLAX) 17 g packet Take 17 g by mouth 2 (two) times daily. 14 each 0   QUEtiapine (SEROQUEL) 400 MG tablet Take 1 tablet (400 mg total) by mouth 2 (two) times daily. (Patient taking differently: Take 200 mg by mouth 2 (two) times  daily.) 60 tablet 0   rosuvastatin (CRESTOR) 20 MG tablet Take 1 tablet (20 mg total) by mouth daily. 30 tablet 0   Cholecalciferol (VITAMIN D) 50 MCG (2000 UT) tablet Take 1 tablet (2,000 Units total) by mouth daily. (Patient not taking: Reported on 10/07/2023) 30 tablet 0   divalproex (DEPAKOTE) 500 MG DR tablet Take 1 tablet (500 mg total) by mouth every 12 (twelve) hours. 60 tablet 0   metoprolol tartrate (LOPRESSOR) 25 MG tablet Take 0.5 tablets (12.5 mg total) by mouth 2 (two) times daily. 30 tablet 0   No current facility-administered medications for this visit.    PAST MEDICAL HISTORY: Past Medical History:  Diagnosis Date   Barrett esophagus    DM II (diabetes mellitus, type II), controlled (HCC) 11/13/2020   Dysphagia    Gastroparesis    GERD (gastroesophageal reflux disease)    Hypercholesteremia    Hypertension    Iron deficiency anemia    Mental retardation    Schizophrenia (HCC)    Seizure (HCC)    Stroke (HCC)     PAST SURGICAL HISTORY: Past Surgical History:  Procedure Laterality Date   BIOPSY  10/21/2020   Procedure: BIOPSY;  Surgeon: Iva Boop, MD;  Location: [facility] ENDOSCOPY;  Service: Endoscopy;;   CRANIOTOMY FOR CYST FENESTRATION     ESOPHAGOGASTRODUODENOSCOPY N/A 01/09/2015   Procedure: ESOPHAGOGASTRODUODENOSCOPY (EGD);  Surgeon: Louis Meckel, MD;  Location: [facility] ENDOSCOPY;  Service: Endoscopy;  Laterality: N/A;   ESOPHAGOGASTRODUODENOSCOPY N/A 10/26/2020   Procedure: ESOPHAGOGASTRODUODENOSCOPY (EGD);  Surgeon: Corliss Skains, MD;  Location: [facility] OR;  Service: Thoracic;  Laterality: N/A;   ESOPHAGOGASTRODUODENOSCOPY (EGD) WITH PROPOFOL N/A 10/21/2020   Procedure: ESOPHAGOGASTRODUODENOSCOPY (EGD) WITH PROPOFOL;  Surgeon: Iva Boop, MD;  Location: [facility] ENDOSCOPY;  Service: Endoscopy;  Laterality: N/A;   IR GASTR TUBE CONVERT GASTR-JEJ PER W/FL MOD SED  11/07/2020   PEG PLACEMENT N/A 10/26/2020   Procedure: PERCUTANEOUS ENDOSCOPIC GASTROSTOMY (PEG)  PLACEMENT;  Surgeon: Corliss Skains, MD;  Location: [facility] OR;  Service: Thoracic;  Laterality: N/A;   XI ROBOTIC ASSISTED HIATAL HERNIA REPAIR N/A 10/26/2020   Procedure: XI ROBOTIC ASSISTED HIATAL HERNIA REPAIR;  Surgeon: Corliss Skains, MD;  Location: [facility] OR;  Service: Thoracic;  Laterality: N/A;    FAMILY HISTORY: Family History  Problem Relation Age of Onset   Breast cancer Mother    Heart disease Father     SOCIAL HISTORY: Social History   Socioeconomic History   Marital status: Single    Spouse name: Not on file   Number of children: 0   Years of education: 12   Highest education level: High school graduate  Occupational History   Occupation: Disabled  Tobacco Use   Smoking status: Never   Smokeless tobacco: Never  Vaping Use   Vaping status: Never Used  Substance and Sexual Activity   Alcohol use: Not Currently   Drug use: Never   Sexual activity: Not Currently  Other Topics Concern   Not on file  Social History Narrative   Lives at [facility] group home (ph: [phone]).   Left-handed.   No daily caffeine use.   Uses wheel chair but working w/pt and ot to get back with walker 06/26/23   18kcal heart healthy diet thin liquid aspiration precautions meds whole in applesauce    Social Determinants of Health   Financial Resource Strain: Not on file  Food Insecurity: No Food Insecurity (06/05/2023)   Hunger Vital Sign    Worried About Running Out of Food in the Last Year: Never true    Ran Out of Food in the Last Year: Never true  Transportation Needs: No Transportation Needs (06/05/2023)   PRAPARE - Administrator, Civil Service (Medical): No    Lack of Transportation (Non-Medical): No  Physical Activity: Not on file  Stress: Not on file  Social Connections: Not on file  Intimate Partner Violence: Not At Risk (06/05/2023)   Humiliation, Afraid, Rape, and Kick questionnaire    Fear of Current or Ex-Partner: No    Emotionally Abused: No     Physically Abused: No    Sexually Abused: No   Otila Kluver, DNP  [facility] [address] [place], [place] [address] [phone]

## 2023-10-08 LAB — CBC WITH DIFFERENTIAL/PLATELET
Basophils Absolute: 0.1 10*3/uL (ref 0.0–0.2)
Basos: 1 %
EOS (ABSOLUTE): 0.1 10*3/uL (ref 0.0–0.4)
Eos: 1 %
Hematocrit: 49 % (ref 37.5–51.0)
Hemoglobin: 16.2 g/dL (ref 13.0–17.7)
Immature Grans (Abs): 0 10*3/uL (ref 0.0–0.1)
Immature Granulocytes: 0 %
Lymphocytes Absolute: 1.4 10*3/uL (ref 0.7–3.1)
Lymphs: 29 %
MCH: 31.6 pg (ref 26.6–33.0)
MCHC: 33.1 g/dL (ref 31.5–35.7)
MCV: 96 fL (ref 79–97)
Monocytes Absolute: 0.5 10*3/uL (ref 0.1–0.9)
Monocytes: 10 %
Neutrophils Absolute: 2.8 10*3/uL (ref 1.4–7.0)
Neutrophils: 59 %
Platelets: 219 10*3/uL (ref 150–450)
RBC: 5.12 x10E6/uL (ref 4.14–5.80)
RDW: 12.1 % (ref 11.6–15.4)
WBC: 4.9 10*3/uL (ref 3.4–10.8)

## 2023-10-08 LAB — COMPREHENSIVE METABOLIC PANEL
ALT: 11 [IU]/L (ref 0–44)
AST: 20 [IU]/L (ref 0–40)
Albumin: 4.8 g/dL (ref 3.9–4.9)
Alkaline Phosphatase: 86 [IU]/L (ref 44–121)
BUN/Creatinine Ratio: 11 (ref 10–24)
BUN: 14 mg/dL (ref 8–27)
Bilirubin Total: 0.6 mg/dL (ref 0.0–1.2)
CO2: 25 mmol/L (ref 20–29)
Calcium: 9.5 mg/dL (ref 8.6–10.2)
Chloride: 101 mmol/L (ref 96–106)
Creatinine, Ser: 1.28 mg/dL — ABNORMAL HIGH (ref 0.76–1.27)
Globulin, Total: 2.9 g/dL (ref 1.5–4.5)
Glucose: 104 mg/dL — ABNORMAL HIGH (ref 70–99)
Potassium: 4.5 mmol/L (ref 3.5–5.2)
Sodium: 143 mmol/L (ref 134–144)
Total Protein: 7.7 g/dL (ref 6.0–8.5)
eGFR: 62 mL/min/{1.73_m2} (ref >59)

## 2023-10-08 LAB — VALPROIC ACID LEVEL: Valproic Acid Lvl: 33 ug/mL — ABNORMAL LOW (ref 50–100)

## 2023-10-08 LAB — LAMOTRIGINE LEVEL: Lamotrigine Lvl: 10.7 ug/mL (ref 2.0–20.0)

## 2023-10-10 ENCOUNTER — Ambulatory Visit: Payer: Medicare Other | Admitting: Neurology

## 2023-11-06 ENCOUNTER — Ambulatory Visit: Payer: Medicare Other | Admitting: Family Medicine

## 2024-05-14 ENCOUNTER — Other Ambulatory Visit (HOSPITAL_COMMUNITY)

## 2024-05-14 ENCOUNTER — Encounter (HOSPITAL_COMMUNITY): Payer: Self-pay | Admitting: Internal Medicine

## 2024-05-14 ENCOUNTER — Observation Stay (HOSPITAL_COMMUNITY): Admission: EM | Admit: 2024-05-14 | Discharge: 2024-05-15 | Attending: Family Medicine | Admitting: Family Medicine

## 2024-05-14 ENCOUNTER — Emergency Department (HOSPITAL_COMMUNITY)

## 2024-05-14 ENCOUNTER — Other Ambulatory Visit: Payer: Self-pay

## 2024-05-14 ENCOUNTER — Inpatient Hospital Stay (HOSPITAL_COMMUNITY)

## 2024-05-14 ENCOUNTER — Encounter (HOSPITAL_COMMUNITY): Payer: Self-pay | Admitting: *Deleted

## 2024-05-14 DIAGNOSIS — G40909 Epilepsy, unspecified, not intractable, without status epilepticus: Secondary | ICD-10-CM | POA: Insufficient documentation

## 2024-05-14 DIAGNOSIS — I69354 Hemiplegia and hemiparesis following cerebral infarction affecting left non-dominant side: Secondary | ICD-10-CM | POA: Diagnosis not present

## 2024-05-14 DIAGNOSIS — R531 Weakness: Principal | ICD-10-CM

## 2024-05-14 DIAGNOSIS — Z79899 Other long term (current) drug therapy: Secondary | ICD-10-CM | POA: Diagnosis not present

## 2024-05-14 DIAGNOSIS — R2689 Other abnormalities of gait and mobility: Secondary | ICD-10-CM | POA: Insufficient documentation

## 2024-05-14 DIAGNOSIS — R4182 Altered mental status, unspecified: Secondary | ICD-10-CM | POA: Diagnosis not present

## 2024-05-14 DIAGNOSIS — R404 Transient alteration of awareness: Secondary | ICD-10-CM | POA: Diagnosis not present

## 2024-05-14 DIAGNOSIS — G9341 Metabolic encephalopathy: Secondary | ICD-10-CM | POA: Diagnosis not present

## 2024-05-14 DIAGNOSIS — I639 Cerebral infarction, unspecified: Secondary | ICD-10-CM | POA: Diagnosis present

## 2024-05-14 DIAGNOSIS — R569 Unspecified convulsions: Secondary | ICD-10-CM | POA: Diagnosis not present

## 2024-05-14 DIAGNOSIS — G9389 Other specified disorders of brain: Secondary | ICD-10-CM | POA: Diagnosis not present

## 2024-05-14 DIAGNOSIS — I6930 Unspecified sequelae of cerebral infarction: Secondary | ICD-10-CM

## 2024-05-14 DIAGNOSIS — R4701 Aphasia: Secondary | ICD-10-CM | POA: Insufficient documentation

## 2024-05-14 DIAGNOSIS — Z789 Other specified health status: Secondary | ICD-10-CM

## 2024-05-14 DIAGNOSIS — R4189 Other symptoms and signs involving cognitive functions and awareness: Secondary | ICD-10-CM | POA: Diagnosis not present

## 2024-05-14 LAB — COMPREHENSIVE METABOLIC PANEL WITH GFR
ALT: 11 U/L (ref 0–44)
AST: 21 U/L (ref 15–41)
Albumin: 3.5 g/dL (ref 3.5–5.0)
Alkaline Phosphatase: 62 U/L (ref 38–126)
Anion gap: 9 (ref 5–15)
BUN: 8 mg/dL (ref 8–23)
CO2: 27 mmol/L (ref 22–32)
Calcium: 8.8 mg/dL — ABNORMAL LOW (ref 8.9–10.3)
Chloride: 105 mmol/L (ref 98–111)
Creatinine, Ser: 1.1 mg/dL (ref 0.61–1.24)
GFR, Estimated: >60 mL/min (ref >60)
Glucose, Bld: 78 mg/dL (ref 70–99)
Potassium: 4.1 mmol/L (ref 3.5–5.1)
Sodium: 141 mmol/L (ref 135–145)
Total Bilirubin: 0.7 mg/dL (ref 0.0–1.2)
Total Protein: 6.7 g/dL (ref 6.5–8.1)

## 2024-05-14 LAB — DIFFERENTIAL
Abs Immature Granulocytes: 0.01 10*3/uL (ref 0.00–0.07)
Basophils Absolute: 0 10*3/uL (ref 0.0–0.1)
Basophils Relative: 1 %
Eosinophils Absolute: 0.1 10*3/uL (ref 0.0–0.5)
Eosinophils Relative: 2 %
Immature Granulocytes: 0 %
Lymphocytes Relative: 47 %
Lymphs Abs: 1.7 10*3/uL (ref 0.7–4.0)
Monocytes Absolute: 0.3 10*3/uL (ref 0.1–1.0)
Monocytes Relative: 9 %
Neutro Abs: 1.5 10*3/uL — ABNORMAL LOW (ref 1.7–7.7)
Neutrophils Relative %: 41 %

## 2024-05-14 LAB — I-STAT CHEM 8, ED
BUN: 9 mg/dL (ref 8–23)
Calcium, Ion: 1.16 mmol/L (ref 1.15–1.40)
Chloride: 104 mmol/L (ref 98–111)
Creatinine, Ser: 1.1 mg/dL (ref 0.61–1.24)
Glucose, Bld: 120 mg/dL — ABNORMAL HIGH (ref 70–99)
HCT: 40 % (ref 39.0–52.0)
Hemoglobin: 13.6 g/dL (ref 13.0–17.0)
Potassium: 3.8 mmol/L (ref 3.5–5.1)
Sodium: 143 mmol/L (ref 135–145)
TCO2: 27 mmol/L (ref 22–32)

## 2024-05-14 LAB — CBC
HCT: 41.3 % (ref 39.0–52.0)
Hemoglobin: 14.1 g/dL (ref 13.0–17.0)
MCH: 32 pg (ref 26.0–34.0)
MCHC: 34.1 g/dL (ref 30.0–36.0)
MCV: 93.9 fL (ref 80.0–100.0)
Platelets: 175 10*3/uL (ref 150–400)
RBC: 4.4 MIL/uL (ref 4.22–5.81)
RDW: 11.9 % (ref 11.5–15.5)
WBC: 3.6 10*3/uL — ABNORMAL LOW (ref 4.0–10.5)
nRBC: 0 % (ref 0.0–0.2)

## 2024-05-14 LAB — APTT: aPTT: 28 s (ref 24–36)

## 2024-05-14 LAB — PROTIME-INR
INR: 1.1 (ref 0.8–1.2)
Prothrombin Time: 14.6 s (ref 11.4–15.2)

## 2024-05-14 LAB — ETHANOL: Alcohol, Ethyl (B): <15 mg/dL (ref <15)

## 2024-05-14 LAB — CBG MONITORING, ED: Glucose-Capillary: 134 mg/dL — ABNORMAL HIGH (ref 70–99)

## 2024-05-14 MED ORDER — ACETAMINOPHEN 500 MG PO TABS: 1000.0000 mg | ORAL_TABLET | Freq: Four times a day (QID) | ORAL | Status: DC | PRN

## 2024-05-14 MED ORDER — DIVALPROEX SODIUM 500 MG PO DR TAB
500.0000 mg | DELAYED_RELEASE_TABLET | Freq: Once | ORAL | Status: AC
  Administered 2024-05-14: 500 mg via ORAL
  Filled 2024-05-14: qty 1

## 2024-05-14 MED ORDER — DIVALPROEX SODIUM 500 MG PO DR TAB
500.0000 mg | DELAYED_RELEASE_TABLET | Freq: Two times a day (BID) | ORAL | Status: DC
  Administered 2024-05-15: 500 mg via ORAL
  Filled 2024-05-14: qty 1

## 2024-05-14 MED ORDER — DIVALPROEX SODIUM 500 MG PO DR TAB: 500.0000 mg | DELAYED_RELEASE_TABLET | Freq: Once | ORAL | Status: DC

## 2024-05-14 MED ORDER — SODIUM CHLORIDE 0.9% FLUSH
3.0000 mL | Freq: Once | INTRAVENOUS | Status: AC
  Administered 2024-05-14: 3 mL via INTRAVENOUS

## 2024-05-14 MED ORDER — LEVOTHYROXINE SODIUM 75 MCG PO TABS
175.0000 ug | ORAL_TABLET | Freq: Every day | ORAL | Status: DC
  Administered 2024-05-15: 175 ug via ORAL
  Filled 2024-05-14: qty 1

## 2024-05-14 MED ORDER — QUETIAPINE FUMARATE 25 MG PO TABS
200.0000 mg | ORAL_TABLET | Freq: Two times a day (BID) | ORAL | Status: DC
  Administered 2024-05-14 – 2024-05-15 (×2): 200 mg via ORAL
  Filled 2024-05-14 (×2): qty 8

## 2024-05-14 MED ORDER — LAMOTRIGINE 25 MG PO TABS
100.0000 mg | ORAL_TABLET | Freq: Every day | ORAL | Status: DC
  Administered 2024-05-14: 100 mg via ORAL
  Filled 2024-05-14: qty 4

## 2024-05-14 MED ORDER — ACETAMINOPHEN 650 MG RE SUPP: 650.0000 mg | Freq: Four times a day (QID) | RECTAL | Status: DC | PRN

## 2024-05-14 MED ORDER — SODIUM CHLORIDE 0.9 % IV SOLN: INTRAVENOUS | Status: DC

## 2024-05-14 MED ORDER — DIVALPROEX SODIUM 500 MG PO DR TAB: 500.0000 mg | DELAYED_RELEASE_TABLET | Freq: Two times a day (BID) | ORAL | Status: DC

## 2024-05-14 MED ORDER — QUETIAPINE FUMARATE 25 MG PO TABS
200.0000 mg | ORAL_TABLET | Freq: Two times a day (BID) | ORAL | Status: DC
Start: 2024-05-14 — End: 2024-05-14

## 2024-05-14 MED ORDER — IOHEXOL 350 MG/ML SOLN
100.0000 mL | Freq: Once | INTRAVENOUS | Status: AC | PRN
Start: 2024-05-14 — End: 2024-05-14
  Administered 2024-05-14: 100 mL via INTRAVENOUS

## 2024-05-14 NOTE — Assessment & Plan Note (Addendum)
 Suspect due to stroke versus seizure as above. Pt does have chronic left-sided deficits from prior stroke and craniotomy.  Appears to be returning to baseline.  Will continue to monitor. -Delirium precautions

## 2024-05-14 NOTE — ED Provider Notes (Signed)
 [facility] Provider Note   CSN: [id number] Arrival date & time: 05/14/24  1424  An emergency department physician performed an initial assessment on this suspected stroke patient at 66.  History  Chief Complaint  Patient presents with   Code Stroke    [name] is a 68 y.o. male.  HPI I have reviewed the history and personally interviewed the patient.  At baseline patient has disability with left hemiplegia from prior stroke.  Patient's caregiver is at bedside giving additional history.  Patient denies he is having a headache.  Denies any pain.  Both he and caregiver at bedside notes that since onset of symptoms, they have noted improvement in getting back to baseline.  History obtained by neurology for code stroke presentation as follows:  "[name] is a 68 y.o. 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  Staff member at bedside states that she went to his room around 8 AM and he sat up in his bed like normal and she helped him transfer into his wheelchair.  She states that he is usually able to push himself to the bathroom and get from his wheelchair to the toilet independently.  Today he asked for assistance which is unusual.  He was also unable to push from the wheelchair to transfer to the toilet.  He took all of his medications this morning as prescribed.  This is managed by the group home.  He has not missed any doses recently. His caretaker additionally states that his speech is a little harder to understand that usual.  Follows with [facility] for seizure and stroke care.  He is  currently on Depakote  DR 500 mg twice daily, Lamictal  100 mg at bedtime.  He is also currently on Seroquel  400 mg twice daily and this is managed by psychiatry"    Home Medications Prior to Admission medications   Medication Sig Start Date End Date Taking? Authorizing Provider  Acetylcysteine (NAC) 500 MG CAPS Take 1 capsule by mouth 2 (two) times daily.    [provider]  benazepril (LOTENSIN) 5 MG tablet Take 5 mg by mouth daily. 06/05/23   [provider]  Cholecalciferol  (VITAMIN D ) 50 MCG (2000 UT) tablet Take 1 tablet (2,000 Units total) by mouth daily. Patient not taking: Reported on 10/07/2023 12/06/20   Angiulli, Everlyn Hockey, PA-C  divalproex  (DEPAKOTE ) 500 MG DR tablet Take 1 tablet (500 mg total) by mouth every 12 (twelve) hours. 06/06/23 07/06/23  Oral Billings, MD  docusate sodium  (COLACE) 100 MG capsule Take 1 capsule (100 mg total) by mouth 2 (two) times daily. 12/06/20   Angiulli, Everlyn Hockey, PA-C  Emollient (CORN HUSKERS EX) Apply 1 application topically in the morning and at bedtime. For dry skin    [provider]  lamoTRIgine  (LAMICTAL ) 100 MG tablet Take 1 tablet (100 mg total) by mouth at bedtime. 04/24/23   Phebe Brasil, MD  levothyroxine  (SYNTHROID ) 175 MCG tablet Take 175 mcg by mouth daily. 04/05/23   [provider]  metoprolol  tartrate (LOPRESSOR ) 25 MG tablet Take 0.5 tablets (12.5 mg total) by mouth 2 (two) times daily. 06/06/23 07/06/23  Oral Billings, MD  pantoprazole  (PROTONIX ) 40 MG tablet Take  1 tablet (40 mg total) by mouth 2 (two) times daily before a meal. 12/06/20   Angiulli, Everlyn Hockey, PA-C  polyethylene glycol (MIRALAX  / GLYCOLAX ) 17 g packet Take 17 g by mouth 2 (two) times daily. 12/06/20   Angiulli, Everlyn Hockey, PA-C  QUEtiapine  (SEROQUEL ) 400 MG tablet Take 1 tablet (400 mg total) by mouth 2 (two) times daily. Patient taking differently: Take 200 mg by mouth 2 (two) times daily. 12/06/20   Angiulli, Everlyn Hockey, PA-C  rosuvastatin   (CRESTOR ) 20 MG tablet Take 1 tablet (20 mg total) by mouth daily. 12/07/20   Angiulli, Everlyn Hockey, PA-C      Allergies    Vancomycin    Review of Systems   Review of Systems  Physical Exam Updated Vital Signs BP (!) 121/90   Pulse 83   Temp 98.6 F (37 C) (Oral)   Resp (!) 26   SpO2 98%  Physical Exam Constitutional:      Comments: Patient is alert.  No distress.  No respiratory distress.  Responding to questions and following commands.  HENT:     Head: Normocephalic and atraumatic.     Mouth/Throat:     Pharynx: Oropharynx is clear.  Cardiovascular:     Rate and Rhythm: Normal rate and regular rhythm.  Pulmonary:     Effort: Pulmonary effort is normal.     Breath sounds: Normal breath sounds.  Abdominal:     General: There is no distension.     Palpations: Abdomen is soft.     Tenderness: There is no abdominal tenderness.  Musculoskeletal:     Comments: Patient has flexion contracture and atrophy of the left upper extremity.  Patient is wearing a removable brace for the left lower extremity.  No significant edema present.  Skin:    General: Skin is warm and dry.  Neurological:     Comments: Patient has baseline cognitive impairment.  He is alert and responds to questions.  He also follows commands.  Patient has 5\5 grip on the right.  Patient has a pre-existing flexion contracture and atrophy from older stroke on the left.  With command he can do some grip strength hand closure on the left.  The patient can spontaneously move right lower extremity and elevate.  Left lower extremity is in a foot drop brace.  Psychiatric:     Comments: Patient is calm and responsive.     ED Results / Procedures / Treatments   Labs (all labs ordered are listed, but only abnormal results are displayed) Labs Reviewed  CBC - Abnormal; Notable for the following components:      Result Value   WBC 3.6 (*)    All other components within normal limits  DIFFERENTIAL - Abnormal; Notable for  the following components:   Neutro Abs 1.5 (*)    All other components within normal limits  I-STAT CHEM 8, ED - Abnormal; Notable for the following components:   Glucose, Bld 120 (*)    All other components within normal limits  CBG MONITORING, ED - Abnormal; Notable for the following components:   Glucose-Capillary 134 (*)    All other components within normal limits  PROTIME-INR  APTT  ETHANOL  LAMOTRIGINE  LEVEL  COMPREHENSIVE METABOLIC PANEL WITH GFR    EKG EKG Interpretation Date/Time:  Thursday May 14 2024 15:16:25 EDT Ventricular Rate:  85 PR Interval:  116 QRS Duration:  81 QT Interval:  371 QTC Calculation: 442 R Axis:   45  Text Interpretation:  Sinus rhythm Borderline short PR interval Abnormal R-wave progression, early transition Borderline T wave abnormalities agree, no acute ischemic appearance Confirmed by Wynetta Heckle [phone]) on 05/14/2024 3:42:54 PM  Radiology CT ANGIO HEAD NECK W WO CM W PERF (CODE STROKE) Result Date: 05/14/2024 CLINICAL DATA:  Neuro deficit, left-sided weakness, aphasia. EXAM: CT ANGIOGRAPHY HEAD AND NECK CT PERFUSION BRAIN TECHNIQUE: Multidetector CT imaging of the head and neck was performed using the standard protocol during bolus administration of intravenous contrast. Multiplanar CT image reconstructions and MIPs were obtained to evaluate the vascular anatomy. Carotid stenosis measurements (when applicable) are obtained utilizing NASCET criteria, using the distal internal carotid diameter as the denominator. Multiphase CT imaging of the brain was performed following IV bolus contrast injection. Subsequent parametric perfusion maps were calculated using RAPID software. RADIATION DOSE REDUCTION: This exam was performed according to the departmental dose-optimization program which includes automated exposure control, adjustment of the mA and/or kV according to patient size and/or use of iterative reconstruction technique. CONTRAST:   OMNIPAQUE  IOHEXOL  350 MG/ML SOLN COMPARISON:  Same-day head CT.  CTA head and neck 04/09/2023. FINDINGS: CTA NECK FINDINGS Aortic arch: Standard configuration of the aortic arch. Imaged portion shows no evidence of aneurysm or dissection. No significant stenosis of the major arch vessel origins. Pulmonary arteries: As permitted by contrast timing, there are no filling defects in the visualized pulmonary arteries. Subclavian arteries: The subclavian arteries are patent bilaterally. Right carotid system: No evidence of dissection, stenosis (50% or greater), or occlusion. Left carotid system: No evidence of dissection, stenosis (50% or greater), or occlusion. Vertebral arteries: Codominant. The right vertebral artery is patent from the origin to the vertebrobasilar confluence. Left vertebral artery is patent from the origin to the intracranial segment and appears to terminate at the origin of the left PICA. No evidence of dissection, stenosis (50% or greater), or occlusion. Skeleton: No acute findings. Degenerative changes in the cervical spine. Other neck: The visualized airway is patent. No cervical lymphadenopathy. Upper chest: Visualized lung apices are unremarkable. Patulous upper thoracic esophagus. Review of the MIP images confirms the above findings CTA HEAD FINDINGS ANTERIOR CIRCULATION: The intracranial ICAs are patent bilaterally. No significant stenosis, proximal occlusion, aneurysm, or vascular malformation. MCAs: The M1 segments are patent bilaterally. Proximal M2 segments appear patent. Visualized distal MCA branches are unremarkable. The left M1 segment is patent. Proximal M2 segments appear patent. Visualized distal MCA branches are unremarkable. Somewhat limited evaluation of distal branches due to motion artifact and contrast bolus timing. ACAs: The A1 and proximal A2 segments are patent bilaterally. Significantly diminutive right ACA from the distal A2 segment on road likely related to prior  infarct. Distal left ACA branches are unremarkable. POSTERIOR CIRCULATION: No significant stenosis, proximal occlusion, aneurysm, or vascular malformation. PCAs: Patent bilaterally.  Fetal origin of both PCAs. Pcomm: The posterior communicating arteries are visualized bilaterally. SCAs: The superior cerebellar arteries are patent bilaterally. Basilar artery: Patent. Somewhat diminutive appearance of the basilar artery with distal tapering. Vertebral arteries: The intracranial vertebral arteries are patent. Venous sinuses: As permitted by contrast timing, patent. Anatomic variants: None Review of the MIP images confirms the above findings CT Brain Perfusion Findings: Error in processing of the CT perfusion study likely due to motion artifact as well as contrast bolus timing which limits evaluation of cerebral artery branches. IMPRESSION: Examination is somewhat limited by motion artifact and contrast bolus timing. Distal cerebral artery branches are particularly limited. Within these limitations there is no large vessel occlusion identified. Diminutive  caliber of the right ACA likely related to prior infarct. Unable to process CT perfusion. 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 Finding of no large vessel occlusion called by telephone at the time of interpretation on 05/14/2024 at 2:54 pm to provider PRAMOD SETHI , who verbally acknowledged these results. Electronically Signed   By: Denny Flack M.D.   On: 05/14/2024 15:08   CT HEAD CODE STROKE WO CONTRAST Result Date: 05/14/2024 CLINICAL DATA:  Code stroke. Neuro deficit, left-sided weakness, aphasia. EXAM: CT HEAD WITHOUT CONTRAST TECHNIQUE: Contiguous axial images were obtained from the base of the skull through the vertex without intravenous contrast. RADIATION DOSE REDUCTION: This exam was performed according to the departmental dose-optimization program which includes automated exposure control, adjustment  of the mA and/or kV according to patient size and/or use of iterative reconstruction technique. COMPARISON:  MRI head 06/05/2023. FINDINGS: Brain: No acute intracranial hemorrhage. Redemonstrated encephalomalacia right frontal lobe. Nonspecific hypoattenuation in the periventricular and subcortical white matter favored to reflect chronic microvascular ischemic changes. Generalized parenchymal volume loss. Basilar cisterns are patent. Posterior fossa is unremarkable. No extra-axial fluid collection. No edema, mass effect, or midline shift. Ventricles: Prominence of the ventricles suggesting underlying parenchymal volume loss. Ex vacuo dilatation of the right frontal horn again noted. Vascular: No hyperdense vessel or unexpected calcification. Skull: Postsurgical changes of right frontal craniotomy. No acute or aggressive finding. Orbits: Orbits are symmetric. Sinuses: The visualized paranasal sinuses are clear. Other: Mastoid air cells are clear. ASPECTS [facility] Stroke Program Early CT Score) - Ganglionic level infarction (caudate, lentiform nuclei, internal capsule, insula, M1-M3 cortex): 7 - Supraganglionic infarction (M4-M6 cortex): 3 Total score (0-10 with 10 being normal): 10 IMPRESSION: 1. No CT evidence of acute intracranial abnormality. 2. Redemonstrated right frontal encephalomalacia. 3. Generalized parenchymal volume loss. Mild chronic microvascular ischemic changes. 4. ASPECTS is 10 These results were communicated to Dr. Janett Medin at 2:50 pm on 05/14/2024 by text page via the [facility] messaging system. Electronically Signed   By: Denny Flack M.D.   On: 05/14/2024 14:50    Procedures Procedures    Medications Ordered in ED Medications  0.9 %  sodium chloride  infusion ( Intravenous New Bag/Given 05/14/24 1525)  sodium chloride  flush (NS) 0.9 % injection 3 mL (3 mLs Intravenous Given 05/14/24 1425)  iohexol  (OMNIPAQUE ) 350 MG/ML injection 100 mL (100 mLs Intravenous Contrast Given 05/14/24 1440)    ED  Course/ Medical Decision Making/ A&P                                 Medical Decision Making Amount and/or Complexity of Data Reviewed Labs: ordered. Radiology: ordered.  Risk Prescription drug management.   Patient arrived as code stroke based on above history of acute change in baseline function with pre-existing left-sided stroke and cognitive impairment.  Reviewed the case with Dr. Janett Medin who has already reviewed CT angiograms and perfusion studies.  He reports perfusion is somewhat limited by motion artifact.  At this time they have not identified any acute stroke or large vessel occlusion.  Plan will be to progress an EEG\MRI and observation for acute change in functional status.  At this time, patient is stable and appears to be returning closer to baseline per interview with himself and caregiver.  I-STAT chemistry normal except glucose 120 ethanol less than 15 CBC white count 3.6 H&H 14 and 41.  At this time patient has stable blood pressures  121/90.  Airway stable.  Will consult for admission for continued stroke workup per discussion with Dr. Janett Medin.    Consult: Family medicine teaching service for admission.          Final Clinical Impression(s) / ED Diagnoses Final diagnoses:  Left-sided weakness  History of stroke with residual deficit  Cognitive impairment    Rx / DC Orders ED Discharge Orders     None         Wynetta Heckle, MD 05/14/24 1622

## 2024-05-14 NOTE — Consult Note (Signed)
 NEUROLOGY CONSULT NOTE   Date of service: May 14, 2024 Patient Name: [name] MRN:  [id number] DOB:  1956-02-18 Chief Complaint: "Code stroke" Requesting Provider: No att. providers found  History of Present Illness  [name] is a 68 y.o. 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  Staff member from the group home where he resides who is present at bedside states that she went to his room around 8 AM and he sat up in his bed like normal and she helped him transfer into his wheelchair.  She states that he is usually able to push himself to the bathroom and get from his wheelchair to the toilet independently.  Today he asked for assistance which is unusual.  He was also unable to push from the wheelchair to transfer to the toilet.  He took all of his medications this morning as prescribed.  This is managed by the group home.  He has not missed any doses recently. His caretaker additionally states that his speech is a little harder to understand that usual.  Follows with [facility] for seizure and stroke care.  He is currently on Depakote  DR 500 mg twice daily, Lamictal  100 mg at bedtime.  He is also currently on Seroquel  400 mg twice daily and this is managed by psychiatry.  LKW: 0800 Modified rankin score: 3-Moderate disability-requires help but walks WITHOUT assistance IV Thrombolysis: No, outside of window EVT: No, no LVO  NIHSS components Score: Comment  1a Level of Conscious 0[x]  1[]  2[]  3[]      1b LOC Questions 0[]  1[]  2[x]       1c LOC Commands 0[x]  1[]  2[]       2 Best Gaze 0[x]  1[]  2[]       3 Visual 0[x]  1[]  2[]  3[]      4  Facial Palsy 0[]  1[x]  2[]  3[]      5a Motor Arm - left 0[]  1[]  2[]  3[x]  4[]  UN[]    5b Motor Arm - Right 0[x]  1[]  2[]  3[]  4[]  UN[]    6a Motor Leg - Left 0[]  1[]  2[x]  3[]  4[]  UN[]    6b Motor Leg - Right 0[x]  1[]  2[]  3[]  4[]  UN[]    7 Limb Ataxia 0[x]  1[]  2[]  UN[]      8 Sensory 0[x]  1[]  2[]  UN[]      9 Best Language 0[x]  1[]  2[]  3[]      10 Dysarthria 0[]  1[x]  2[]  UN[]      11 Extinct. and Inattention 0[x]  1[]  2[]       TOTAL: 9       ROS  Comprehensive ROS performed and pertinent positives documented in HPI   Past History   Past Medical History:  Diagnosis Date   Barrett esophagus    DM II (diabetes mellitus, type II), controlled (HCC) 11/13/2020   Dysphagia    Gastroparesis    GERD (gastroesophageal reflux disease)    Hypercholesteremia    Hypertension    Iron deficiency anemia    Mental retardation    Schizophrenia (HCC)    Seizure (HCC)    Stroke [facility])     Past Surgical History:  Procedure Laterality Date   BIOPSY  10/21/2020   Procedure: BIOPSY;  Surgeon: Kenney Peacemaker, MD;  Location: [facility] ENDOSCOPY;  Service: Endoscopy;;  CRANIOTOMY FOR CYST FENESTRATION     ESOPHAGOGASTRODUODENOSCOPY N/A 01/09/2015   Procedure: ESOPHAGOGASTRODUODENOSCOPY (EGD);  Surgeon: Claudette Cue, MD;  Location: [facility] ENDOSCOPY;  Service: Endoscopy;  Laterality: N/A;   ESOPHAGOGASTRODUODENOSCOPY N/A 10/26/2020   Procedure: ESOPHAGOGASTRODUODENOSCOPY (EGD);  Surgeon: Hilarie Lovely, MD;  Location: [facility] OR;  Service: Thoracic;  Laterality: N/A;   ESOPHAGOGASTRODUODENOSCOPY (EGD) WITH PROPOFOL  N/A 10/21/2020   Procedure: ESOPHAGOGASTRODUODENOSCOPY (EGD) WITH PROPOFOL ;  Surgeon: Kenney Peacemaker, MD;  Location: [facility] ENDOSCOPY;  Service: Endoscopy;  Laterality: N/A;   IR GASTR TUBE CONVERT GASTR-JEJ PER W/FL MOD SED  11/07/2020   PEG PLACEMENT N/A 10/26/2020   Procedure: PERCUTANEOUS ENDOSCOPIC GASTROSTOMY (PEG) PLACEMENT;  Surgeon: Hilarie Lovely, MD;  Location: [facility] OR;  Service: Thoracic;   Laterality: N/A;   XI ROBOTIC ASSISTED HIATAL HERNIA REPAIR N/A 10/26/2020   Procedure: XI ROBOTIC ASSISTED HIATAL HERNIA REPAIR;  Surgeon: Hilarie Lovely, MD;  Location: [facility] OR;  Service: Thoracic;  Laterality: N/A;    Family History: Family History  Problem Relation Age of Onset   Breast cancer Mother    Heart disease Father     Social History  reports that he has never smoked. He has never used smokeless tobacco. He reports that he does not currently use alcohol. He reports that he does not use drugs.  Allergies  Allergen Reactions   Vancomycin Itching and Other (See Comments)    Unknown-possible hives      Medications  No current facility-administered medications for this encounter.  Current Outpatient Medications:    Acetylcysteine (NAC) 500 MG CAPS, Take 1 capsule by mouth 2 (two) times daily., Disp: , Rfl:    benazepril (LOTENSIN) 5 MG tablet, Take 5 mg by mouth daily., Disp: , Rfl:    Cholecalciferol  (VITAMIN D ) 50 MCG (2000 UT) tablet, Take 1 tablet (2,000 Units total) by mouth daily. (Patient not taking: Reported on 10/07/2023), Disp: 30 tablet, Rfl: 0   divalproex  (DEPAKOTE ) 500 MG DR tablet, Take 1 tablet (500 mg total) by mouth every 12 (twelve) hours., Disp: 60 tablet, Rfl: 0   docusate sodium  (COLACE) 100 MG capsule, Take 1 capsule (100 mg total) by mouth 2 (two) times daily., Disp: 10 capsule, Rfl: 0   Emollient (CORN HUSKERS EX), Apply 1 application topically in the morning and at bedtime. For dry skin, Disp: , Rfl:    lamoTRIgine  (LAMICTAL ) 100 MG tablet, Take 1 tablet (100 mg total) by mouth at bedtime., Disp: 90 tablet, Rfl: 3   levothyroxine  (SYNTHROID ) 175 MCG tablet, Take 175 mcg by mouth daily., Disp: , Rfl:    metoprolol  tartrate (LOPRESSOR ) 25 MG tablet, Take 0.5 tablets (12.5 mg total) by mouth 2 (two) times daily., Disp: 30 tablet, Rfl: 0   pantoprazole  (PROTONIX ) 40 MG tablet, Take 1 tablet (40 mg total) by mouth 2 (two) times daily before a meal.,  Disp: 60 tablet, Rfl: 0   polyethylene glycol (MIRALAX  / GLYCOLAX ) 17 g packet, Take 17 g by mouth 2 (two) times daily., Disp: 14 each, Rfl: 0   QUEtiapine  (SEROQUEL ) 400 MG tablet, Take 1 tablet (400 mg total) by mouth 2 (two) times daily. (Patient taking differently: Take 200 mg by mouth 2 (two) times daily.), Disp: 60 tablet, Rfl: 0   rosuvastatin  (CRESTOR ) 20 MG tablet, Take 1 tablet (20 mg total) by mouth daily., Disp: 30 tablet, Rfl: 0  Vitals   Vitals:   05/14/24 1440 05/14/24 1449 05/14/24 1450 05/14/24 1451  BP: 109/63 113/80  Pulse: 86 92 93 93  Resp:  18    Temp:  98.6 F (37 C)    TempSrc:  Oral    SpO2: 94% 99% 98% 100%    There is no height or weight on file to calculate BMI.  Physical Exam   Constitutional: Appears well-developed and well-nourished.  Psych: Affect appropriate to situation.  Eyes: No scleral injection.  HENT: No OP obstruction.  Head: Normocephalic.  Cardiovascular: Normal rate and regular rhythm.  Respiratory: Effort normal, non-labored breathing.  GI: Soft.  No distension. There is no tenderness.  Skin: WDI.   Neurologic Examination   Neuro: Mental Status: Patient is awake, alert, disoriented to person, place, month, year, and situation.  Diminished attention, registration and recall. Speech is mildly dysarthric in comparison to baseline.  Follows simple midline and few one-step commands only. Cranial Nerves: II: Visual Fields blinks to threat bilaterally.  Not very cooperative for detailed visual field testing.  Pupils are equal, round, and reactive to light.    III,IV, VI: EOMI without ptosis or diploplia.  V: Facial sensation is symmetric to temperature VII: Facial movement is symmetric resting and smiling VIII: Hearing is intact to voice X: Palate elevates symmetrically XI: Shoulder shrug is symmetric. XII: Tongue protrudes midline without atrophy or fasciculations.  Motor: Tone is normal. Bulk is normal.  Spastic hemiparesis of  left upper and lower extremity with 3/5 strength on the left.  Left foot drop with left AFO Sensory: Sensation is symmetric to light touch in the arms and legs.  Cerebellar: No gross ataxia noted on the right   Labs/Imaging/Neurodiagnostic studies   CBC:  Recent Labs  Lab 06-06-2024 1433 06/06/24 1434  WBC 3.6*  --   NEUTROABS 1.5*  --   HGB 14.1 13.6  HCT 41.3 40.0  MCV 93.9  --   PLT 175  --    Basic Metabolic Panel:  Lab Results  Component Value Date   NA 143 Jun 06, 2024   K 3.8 06/06/24   CO2 25 10/07/2023   GLUCOSE 120 (H) 06/06/24   BUN 9 2024/06/06   CREATININE 1.10 2024/06/06   CALCIUM  9.5 10/07/2023   GFRNONAA >60 06/06/2023   GFRAA >60 09/07/2020   Lipid Panel:  Lab Results  Component Value Date   LDLCALC 38 04/10/2023   HgbA1c:  Lab Results  Component Value Date   HGBA1C 5.6 04/09/2023   Urine Drug Screen:     Component Value Date/Time   LABOPIA NONE DETECTED 06/05/2023 0052   COCAINSCRNUR NONE DETECTED 06/05/2023 0052   LABBENZ POSITIVE (A) 06/05/2023 0052   AMPHETMU NONE DETECTED 06/05/2023 0052   THCU NONE DETECTED 06/05/2023 0052   LABBARB NONE DETECTED 06/05/2023 0052    Alcohol Level     Component Value Date/Time   ETH <10 06/04/2023 2225   INR  Lab Results  Component Value Date   INR 1.1 June 06, 2024   APTT  Lab Results  Component Value Date   APTT 28 06-06-2024   AED levels:  Lab Results  Component Value Date   LAMOTRIGINE  10.7 10/07/2023    CT Head without contrast(Personally reviewed): 1. No CT evidence of acute intracranial abnormality. 2. Redemonstrated right frontal encephalomalacia. 3. Generalized parenchymal volume loss. Mild chronic microvascular ischemic changes. 4. ASPECTS is 10  CT angio Head and Neck with contrast(Personally reviewed): No LVO  MRI Brain pending  Neurodiagnostics rEEG:  Pending  ASSESSMENT   [name] is a 68 y.o. 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  Differential includes numerous right brain stroke versus unwitnessed seizure with postictal worsening of focal deficits versus toxic metabolic encephalopathy. Patient not a candidate for IV TNK due to presenting outside time window. Patient not a candidate for mechanical thrombectomy as he does not have an LVO on CT angio He does have a history of both stroke and seizure; MRI brain and EEG are pending. Plan to admit to hospitalist for continued work up and neuro will follow. Continue home AED regimen at this time.   Current AEDs: Depakote  DR 500 mg twice daily Lamictal  100 mg at bedtime.  RECOMMENDATIONS  - MRI brian wo contrast  - Stroke work up if positive for acute infarct - rEEG  - Infectious work up per EDP  ______________________________________________________________________    Signed, Imogene Mana, NP [facility]  STROKE MD NOTE : I have personally obtained history,examined this patient, reviewed notes, independently viewed imaging studies, participated in medical decision making and plan of care.ROS completed by me personally and pertinent positives fully documented  I have made any additions or clarifications directly to the above note. Agree with note above.  Patient with history of developmental delay, stroke, seizure disorder and spastic left hemiplegia presents with altered mental status and increased left-sided deficits this morning.  He has presented outside time window for thrombolysis and CT angiogram shows no LVO.  Recommend admission for further workup.  Check MRI scan of the brain and EEG.  Continue home medications.  Check UA, chest  x-ray and metabolic labs.  Discussed with Dr. Kayla Part MD.  Stroke team will follow on consults. Greater than 50% time during this 60-minute consultation visit was spent in counseling and coordination of care about his presentation and discussion about differential diagnosis and evaluation and treatment plan and answering questions. Ardella Beaver, MD Medical Director [facility] Stroke Center Pager: [phone] 05/14/2024 4:48 PM

## 2024-05-14 NOTE — ED Notes (Signed)
 Arrives back to exam room after CT. Neuro NP and neuro team present. Family at [facility]. No obvious changes. No sz activity. Remains alert, NAD, calm, interactive, answers questions, follows commands. VSS.

## 2024-05-14 NOTE — Code Documentation (Signed)
 Stroke Response Nurse Documentation Code Documentation  [name] is a 68 y.o. 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 On No antithrombotic. Code stroke was activated by EMS.   Patient from group home where he was LKW at bedtime last night 05/13/2024, which was 2200 per patient,  and now complaining of aphasia and left sided weakness. Per staff member, she went to his room at 0800 05/14/2024 and she assisted him to sit in his wheelchair like normal but he was unable to wheel himself to the bathroom and get to the toilet independently which he is able to do at baseline. Staff member noticed he was drooping to the left while sitting which is not his baseline.  Stroke team at the bedside on patient arrival. Labs drawn and patient cleared for CT by EDP. Patient to CT with team. NIHSS 9, see documentation for details and code stroke times. Patient with disoriented, left facial droop, left arm weakness, left leg weakness, and dysarthria  on exam. The following imaging was completed:  CT Head, CTA, and CTP. Patient is not a candidate for IV Thrombolytic due to outside of window per MD. Patient is not a candidate for IR due to no LVO noted on imaging per MD.   Care Plan: VS/NIHSS q2hr x12hr, then q4hr; BP Goal <220/120.   Bedside handoff with ED RN Sam Creighton.    Selestino Dakin  Stroke Response RN

## 2024-05-14 NOTE — ED Notes (Signed)
 No changes. Personal caregiver at [facility]. Sister and HCPOA at [facility]. EEG continues. VSS.

## 2024-05-14 NOTE — ED Notes (Signed)
 Pt to MRI via stretcher, no changes, alert, NAD, calm, interactive.

## 2024-05-14 NOTE — Plan of Care (Addendum)
 FMTS Interim Progress Note  [name] is a 68 y.o. male with past history of brain tumor status post craniotomy and resection, epilepsy since craniotomy, developmental disability admitted for stroke rule out and altered mental status and weakness.  S: Patient reports no concerns.  Reports that current weakness is baseline.  Reports that his sister has not been by yet.    O: BP 124/85   Pulse 83   Temp 98.9 F (37.2 C) (Oral)   Resp 20   SpO2 96%    Physical Exam Constitutional:      General: He is not in acute distress.    Appearance: He is not ill-appearing.  Cardiovascular:     Pulses: Normal pulses.     Heart sounds: Normal heart sounds.  Pulmonary:     Effort: Pulmonary effort is normal.     Breath sounds: Normal breath sounds.  Abdominal:     Palpations: Abdomen is soft.     Tenderness: There is no abdominal tenderness.  Skin:    General: Skin is warm and dry.  Neurological:     Mental Status: He is alert.     Comments: Alert.  Attention intact.  Converses appropriately.  Speech without aphasia or dysarthria.  2/5 strength left upper extremity.  4+/5 strength left lower extremity. 5/5 right upper and lower extremity.  Sensation intact and symmetric.    Psychiatric:        Mood and Affect: Mood normal.        Behavior: Behavior normal.     A/P: Improvement in patient's mentation to what seems to be baseline per chart review.  Chronic left upper extremity and lower extremity weakness.  MRI shows no acute intracranial abnormality.  Given concern for altered mentation changes added on Depakote  trough level and asked nurse to dose Depakote  early to allow 9 to 12 hours before trough.  Routine EEG already performed and pending interpretation. Spoke to pharmacy regarding his Seroquel  in setting of possible AMS and discussed plan of decreasing to 200 twice daily until confirmed that he is improving. Pharm recommended against stopping fully and suggested decreasing dose  to balance risk benefit of cutting back sedating med but also not precipating withdrawal, although withdrawal symptoms appear mild. Per pharmacy records prescribed levothyroxine  175 mcg once daily and seroquel  300 twice daily. Per records also on rosuvastatin  20, pantoprazole  40 once daily, metoprolol  25 once daily, and benazepril 5 once daily but will defer to pharm rec for these lower urgency medications.  Follow-up neuro recommendations in morning Follow-up EEG results Blood pressure goal normotensive given no stroke Follow-up Depakote  trough level Restart Seroquel  at 200 twice daily, titrate to 300 mg once AMS confirmed resolved per sister Restart home levothyroxine  175 mcg once daily starting tomorrow  Restart rosuvastatin , pantoprazole , metoprolol , benazepril per med rec tmrw Neuro recommends UA/CXR. Defer to day team   Verdell Given, MD 05/14/2024, 10:22 PM PGY-1, [facility] Medicine Service pager [phone]

## 2024-05-14 NOTE — ED Notes (Signed)
 Neuro MD into see, at [facility].

## 2024-05-14 NOTE — ED Notes (Signed)
 EEG at [facility]. Complete rainbow re-sent to lab

## 2024-05-14 NOTE — Hospital Course (Addendum)
 Foster P Stickles is a 68 y.o.male with a history of L hemiplegia from tumor resection, developmental disability, epilepsy, schizophrenia, GERD w/ barrett's, hx GI bleed, HTN, hypothyroidism, HLD who was admitted to the [facility] Medicine Teaching Service at [facility] for weakness/AMS and stroke rule out. His hospital course is detailed below:  AMS/weakness Not at baseline per nursing home. Chronic left sided weakness from previous CVA.  CT/CTA/MRI negative for acute intracranial abnormalities. EEG also without evidence of seizures. Patient returned to baseline shortly after presentation/admission, and continued to do well. Neurology could not rule out TIA and recommended starting ASA 81 mg daily on discharge. Given reassuring workup and return to baseline patient was appropriate for discharge back to his group home.    Epilepsy On lamictal  and depakote .  As above neurology got EEG which did not show evidence of seizure activity.  Patient did not demonstrate any seizure events while hospitalized.  Patient was discharged in good condition.  Other chronic conditions were medically managed with home medications and formulary alternatives as necessary (schizophrenia, Hx GI bleed, Hypothyroidism, HTN, HLD)  PCP Follow-up Recommendations: Has neurology follow-up scheduled 10/06/2024; will need sooner follow-up. Make sure pt is getting ASA 81 mg daily.

## 2024-05-14 NOTE — ED Notes (Signed)
 Admitting team at [facility]

## 2024-05-14 NOTE — Assessment & Plan Note (Signed)
 Seizures: Continue home Depakote  DR 500 mg twice daily, Lamictal  100 mg at bedtime. Restart other home medications as appropriate pending med rec.

## 2024-05-14 NOTE — H&P (Signed)
 Hospital Admission History and Physical Service Pager: [phone]  Patient name: [name] Medical record number: [id number] Date of Birth: [birth year] Age: 68 y.o. Gender: male  Primary Care Provider: Eduardo [name] Consultants: Neurology Code Status: Full which was confirmed with [name] (sister) Preferred Emergency Contact:  Sister [name] is HPOA [name] (works at his group home) [phone]   Contact Information     Name Relation Home Work [name] Sister [phone]     [name],Gerald Other   [phone]   [name]   [name]   home,group Other [phone]        Other Contacts   None on File      Chief Complaint: AMS, weakness  Assessment and Plan: [name] is a 68 y.o. male presenting with change in mental status and new onset weakness . Differential for presentation of this includes:  - Stroke most suspicious given symptomology and timeline, especially now that patient is beginning to improve/return to his baseline. - Breakthrough seizure possible, no witnessed seizure activity to but AMS does represent possible postictal state though less likely given that he was able to feed himself and take his morning medications prior to worsening symptoms.  Assessment & Plan Stroke [facility]) No obvious acute stroke/LVO on CT, pending MRI Brain. Does appear to be returning to baseline. - Admit to FMTS, attending Dr. Alverna John - Med surg, Vital signs per floor - Neurology consulted and has begun workup on pt - MRI Brain - EEG - PT/OT to treat - AM CBC, BMP  - Fall precautions - passed bedside swallow, will restart diet AMS (altered mental status)  Weakness Suspect due to stroke versus seizure as above. Pt does have chronic left-sided deficits from prior stroke and craniotomy.  Appears to be returning to baseline.  Will continue to monitor. -Delirium precautions Chronic health problem Seizures: Continue home  Depakote  DR 500 mg twice daily, Lamictal  100 mg at bedtime. Restart other home medications as appropriate pending med rec.  FEN/GI: Regular diet VTE Prophylaxis: SCDs given concern for stroke  Disposition: MedSurg  History of Present Illness:  [name] is a 68 y.o. male presenting with new onset altered mental status weakness worse from baseline.  His last known normal according to group home worker was last night.  Though she does note that he got up this morning and took all of his morning medications, ate breakfast around 8 AM.  But around 10 AM when they were going out he was somewhat slumped over in could not walk correctly.  No recent illnesses/fevers, no falls or other trauma.  No recent changes in medication regimen.  He has a good appetite and has been eating regularly.  Sister is also at bedside and shares that patient is able to speak and communicate, as well as follow directions, however he is not able to convey when he is in pain or discomfort.  His had poor short-term memory since his craniotomy.  Sister reports over the last 10 years he has slowly been deconditioning and becoming weaker.  He now needs a walker to ambulate.  Sister and group home caregiver both agree that he is returning to his baseline now in the ED.  In the ED, code stroke was called.  Labs unremarkable. Patient had CT angiogram which did not show any obvious acute stroke or large vessel occlusion.  Neurology was consulted and EEG, brain MRI were ordered.  Review Of Systems: Per HPI.  Pertinent  Past Medical History: Left hemiplegia (h/o prior stroke) Developmental disability Seizure disorder Schizophrenia GERD with Barrett's esophagitis H/o GI bleed HTN Hypothyroid HLD Remainder reviewed in history tab.   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   Pertinent Social History: Tobacco use: no Alcohol use: no Other Substance  use: none Lives at group home.   Pertinent Family History: None Remainder reviewed in history tab.   Important Outpatient Medications: Following medications confirmed by family/caregiver; otherwise med rec not yet complete. Depakote  DR 500 mg twice daily Lamictal  100 mg at bedtime Seroquel  400 mg twice daily Remainder reviewed in medication history.   Objective: BP (!) 141/82   Pulse 68   Temp 98.6 F (37 C) (Oral)   Resp (!) 29   SpO2 99%  Exam: General: Sitting in bed comfortably, no acute distress. HEENT: normocephalic, PERRLA, EOM grossly intact, MMM. Cardio: Regular rate, regular rhythm, no murmurs on exam. Pulm: Clear, no wheezing, no crackles. No increased work of breathing on RA. Abdominal: bowel sounds present, soft, no apparent TTP, non-distended. Extremities: no peripheral edema. Moves all extremities, but does have some left-sided weakness (see below). Neuro: CN II: PERRL CN III, IV,VI: EOMI RUE strength 5/5; can flex both LEs, baseline left UE contracture and weakness. Psych:  Alert, pleasant, baseline communicative, cooperative with instructions.   Labs:  CBC BMET  Recent Labs  Lab 05/14/24 1433 05/14/24 1434  WBC 3.6*  --   HGB 14.1 13.6  HCT 41.3 40.0  PLT 175  --    Recent Labs  Lab 05/14/24 1434  NA 143  K 3.8  CL 104  BUN 9  CREATININE 1.10  GLUCOSE 120*     Lamotrigine  level pending  EKG: Regular rate and rhythm, no evidence of QTc prolongation, no evidence of ST elevation.  5/22 CT head: IMPRESSION: 1. No CT evidence of acute intracranial abnormality. 2. Redemonstrated right frontal encephalomalacia. 3. Generalized parenchymal volume loss. Mild chronic microvascular ischemic changes. 4. ASPECTS is 10  5/22 CTA head and neck: IMPRESSION: Examination is somewhat limited by motion artifact and contrast bolus timing. Distal cerebral artery branches are particularly limited. Within these limitations there is no large vessel  occlusion identified. Diminutive caliber of the right ACA likely related to prior infarct. Unable to process CT perfusion. 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  Omar Bibber, DO 05/14/2024, 5:52 PM PGY-1, [facility] Family Medicine  FPTS Intern pager: [phone], text pages welcome Secure chat group [facility] Teaching Service

## 2024-05-14 NOTE — ED Notes (Signed)
 Per message from MD Almer Jacobson, ordered to give one time dose of Depakote  500 mg, once now (See MAR), to better measure through levels, awaiting pharmacy verification.

## 2024-05-14 NOTE — Progress Notes (Signed)
 Routine EEG completed, results pending Neurology review and interpretation

## 2024-05-14 NOTE — ED Notes (Signed)
 EDP at [facility]. EEG at [facility] in progress. Denies HA or dizziness. Alert, NAD, calm, interactive. Continues to answer questions and follow commands.

## 2024-05-14 NOTE — Assessment & Plan Note (Signed)
 No obvious acute stroke/LVO on CT, pending MRI Brain. Does appear to be returning to baseline. - Admit to FMTS, attending Dr. Alverna John - Med surg, Vital signs per floor - Neurology consulted and has begun workup on pt - MRI Brain - EEG - PT/OT to treat - AM CBC, BMP  - Fall precautions - passed bedside swallow, will restart diet

## 2024-05-14 NOTE — ED Notes (Signed)
 Pt returned from MRI. No apparent distress. RR even and unlabored. Visitor feeding patient meal without difficulty.

## 2024-05-14 NOTE — Progress Notes (Signed)
EEG complete, results pending 

## 2024-05-15 ENCOUNTER — Other Ambulatory Visit (HOSPITAL_COMMUNITY): Payer: Self-pay

## 2024-05-15 DIAGNOSIS — R531 Weakness: Secondary | ICD-10-CM

## 2024-05-15 DIAGNOSIS — R4189 Other symptoms and signs involving cognitive functions and awareness: Secondary | ICD-10-CM

## 2024-05-15 DIAGNOSIS — R404 Transient alteration of awareness: Secondary | ICD-10-CM | POA: Diagnosis not present

## 2024-05-15 DIAGNOSIS — I6930 Unspecified sequelae of cerebral infarction: Secondary | ICD-10-CM | POA: Diagnosis not present

## 2024-05-15 DIAGNOSIS — R569 Unspecified convulsions: Secondary | ICD-10-CM

## 2024-05-15 DIAGNOSIS — I639 Cerebral infarction, unspecified: Secondary | ICD-10-CM | POA: Diagnosis not present

## 2024-05-15 LAB — BASIC METABOLIC PANEL WITH GFR
Anion gap: 5 (ref 5–15)
BUN: 10 mg/dL (ref 8–23)
CO2: 27 mmol/L (ref 22–32)
Calcium: 8.4 mg/dL — ABNORMAL LOW (ref 8.9–10.3)
Chloride: 107 mmol/L (ref 98–111)
Creatinine, Ser: 0.95 mg/dL (ref 0.61–1.24)
GFR, Estimated: >60 mL/min (ref >60)
Glucose, Bld: 110 mg/dL — ABNORMAL HIGH (ref 70–99)
Potassium: 3.8 mmol/L (ref 3.5–5.1)
Sodium: 139 mmol/L (ref 135–145)

## 2024-05-15 LAB — CBC
HCT: 39.5 % (ref 39.0–52.0)
Hemoglobin: 13.1 g/dL (ref 13.0–17.0)
MCH: 31.3 pg (ref 26.0–34.0)
MCHC: 33.2 g/dL (ref 30.0–36.0)
MCV: 94.5 fL (ref 80.0–100.0)
Platelets: 164 10*3/uL (ref 150–400)
RBC: 4.18 MIL/uL — ABNORMAL LOW (ref 4.22–5.81)
RDW: 11.9 % (ref 11.5–15.5)
WBC: 3.8 10*3/uL — ABNORMAL LOW (ref 4.0–10.5)
nRBC: 0 % (ref 0.0–0.2)

## 2024-05-15 LAB — HIV ANTIBODY (ROUTINE TESTING W REFLEX): HIV Screen 4th Generation wRfx: NONREACTIVE

## 2024-05-15 LAB — VALPROIC ACID LEVEL: Valproic Acid Lvl: 88 ug/mL (ref 50–100)

## 2024-05-15 MED ORDER — ASPIRIN 81 MG PO TBEC
81.0000 mg | DELAYED_RELEASE_TABLET | Freq: Every day | ORAL | 11 refills | Status: DC
  Filled 2024-05-15: qty 30, 30d supply, fill #0

## 2024-05-15 NOTE — Assessment & Plan Note (Deleted)
 Seizures: Continue home Depakote  DR 500 mg twice daily, Lamictal  100 mg at bedtime. Restart other home medications as appropriate pending med rec.

## 2024-05-15 NOTE — Discharge Summary (Signed)
 Family Medicine Teaching [facility] Discharge Summary  Patient name: [name] Medical record number: [id number] Date of birth: Aug 16, 1956 Age: 68 y.o. Gender: male Date of Admission: 05/14/2024  Date of Discharge: 05/15/24 Admitting Physician: Omar Bibber, DO  Primary Care Provider: Eduardo Grade Consultants: Neurology  Indication for Hospitalization: AMS, new weakness  Brief Hospital Course:  [name] is a 68 y.o.male with a history of L hemiplegia from tumor resection, developmental disability, epilepsy, schizophrenia, GERD w/ barrett's, hx GI bleed, HTN, hypothyroidism, HLD who was admitted to the [facility] Medicine Teaching Service at [facility] for weakness/AMS and stroke rule out. His hospital course is detailed below:  AMS/weakness Not at baseline per nursing home. Chronic left sided weakness from previous CVA.  CT/CTA/MRI negative for acute intracranial abnormalities. EEG also without evidence of seizures. Patient returned to baseline shortly after presentation/admission, and continued to do well. Neurology could not rule out TIA and recommended starting ASA 81 mg daily on discharge. Given reassuring workup and return to baseline patient was appropriate for discharge back to his group home.    Epilepsy On lamictal  and depakote .  As above neurology got EEG which did not show evidence of seizure activity.  Patient did not demonstrate any seizure events while hospitalized.  Patient was discharged in good condition.  Other chronic conditions were medically managed with home medications and formulary alternatives as necessary (schizophrenia, Hx GI bleed, Hypothyroidism, HTN, HLD)  PCP Follow-up Recommendations: Has neurology follow-up scheduled 10/06/2024; will need sooner follow-up. Make sure pt is getting ASA 81 mg daily.   Discharge Diagnoses/Problem List:  Principal Problem:   Stroke [facility]) Active Problems:   AMS (altered mental status)  Weakness    Chronic health problem  Disposition: Return to group home  Discharge Condition: stable  Discharge Exam:  General: Comfortable appearing, sitting in bed with breakfast tray, NAD Cardiovascular: RRR Respiratory: CTA bilaterally, normal work of breathing on room air Abdomen: Normoactive bowel sounds.  Soft, nontender.  Significant Procedures: None  Significant Labs and Imaging:  Recent Labs  Lab 05/14/24 1433 05/14/24 1434 05/15/24 0512  WBC 3.6*  --  3.8*  HGB 14.1 13.6 13.1  HCT 41.3 40.0 39.5  PLT 175  --  164   Recent Labs  Lab 05/14/24 1434 05/14/24 1522 05/15/24 0512  NA 143 141 139  K 3.8 4.1 3.8  CL 104 105 107  CO2  --  27 27  GLUCOSE 120* 78 110*  BUN 9 8 10   CREATININE 1.10 1.10 0.95  CALCIUM   --  8.8* 8.4*  ALKPHOS  --  62  --   AST  --  21  --   ALT  --  11  --   ALBUMIN   --  3.5  --    5/22 EEG: IMPRESSION: This study is suggestive of mild diffuse encephalopathy. No seizures or epileptiform discharges were seen throughout the recording.  5/22 MRI Brain: IMPRESSION: No acute intracranial abnormality. Redemonstrated encephalomalacia in the right frontal lobe with associated volume loss as above. Remote infarcts in the right basal ganglia. Mild chronic microvascular ischemic changes. Small chronic microhemorrhage in the left temporal lobe.  5/22 CT head: IMPRESSION: 1. No CT evidence of acute intracranial abnormality. 2. Redemonstrated right frontal encephalomalacia. 3. Generalized parenchymal volume loss. Mild chronic microvascular ischemic changes. 4. ASPECTS is 10   5/22 CTA head and neck: IMPRESSION: Examination is somewhat limited by motion artifact and contrast bolus timing. Distal cerebral artery branches are particularly limited. Within  these limitations there is no large vessel occlusion identified. Diminutive caliber of the right ACA likely related to prior infarct. Unable to process CT perfusion. Findings suggestive of  congenital vertebrobasilar hypoplasia. Patulous thoracic esophagus.  Results/Tests Pending at Time of Discharge: none  Discharge Medications:  Allergies as of 05/15/2024       Reactions   Vancomycin Itching, Other (See Comments)   Unknown-possible hives        Medication List     STOP taking these medications    Vitamin D  50 MCG (2000 UT) tablet       TAKE these medications    aspirin EC 81 MG tablet Take 1 tablet (81 mg total) by mouth daily. Swallow whole.   benazepril 5 MG tablet Commonly known as: LOTENSIN Take 5 mg by mouth daily.   CORN HUSKERS EX Apply 1 application topically in the morning and at bedtime. For dry skin   divalproex  500 MG DR tablet Commonly known as: DEPAKOTE  Take 1 tablet (500 mg total) by mouth every 12 (twelve) hours.   docusate sodium  100 MG capsule Commonly known as: COLACE Take 1 capsule (100 mg total) by mouth 2 (two) times daily.   lamoTRIgine  100 MG tablet Commonly known as: LAMICTAL  Take 1 tablet (100 mg total) by mouth at bedtime.   levothyroxine  175 MCG tablet Commonly known as: SYNTHROID  Take 175 mcg by mouth daily.   metoprolol  tartrate 25 MG tablet Commonly known as: LOPRESSOR  Take 0.5 tablets (12.5 mg total) by mouth 2 (two) times daily.   NAC 500 MG Caps Generic drug: Acetylcysteine Take 1 capsule by mouth 2 (two) times daily.   pantoprazole  40 MG tablet Commonly known as: PROTONIX  Take 1 tablet (40 mg total) by mouth 2 (two) times daily before a meal.   polyethylene glycol 17 g packet Commonly known as: MIRALAX  / GLYCOLAX  Take 17 g by mouth 2 (two) times daily.   QUEtiapine  300 MG tablet Commonly known as: SEROQUEL  Take 300 mg by mouth 2 (two) times daily.   rosuvastatin  20 MG tablet Commonly known as: CRESTOR  Take 1 tablet (20 mg total) by mouth daily.        Discharge Instructions: Please refer to Patient Instructions section of EMR for full details.  Patient was counseled important signs and  symptoms that should prompt return to medical care, changes in medications, dietary instructions, activity restrictions, and follow up appointments.   Follow-Up Appointments:   Omar Bibber, DO 05/15/2024, 11:25 AM PGY-1, [facility] Family Medicine

## 2024-05-15 NOTE — Care Management CC44 (Signed)
 Condition Code 44 Documentation Completed  Patient Details  Name: [name] MRN: [id number] Date of Birth: Aug 21, 1956   Condition Code 44 given:  Yes Patient signature on Condition Code 44 notice:  Yes Documentation of 2 MD's agreement:  Yes Code 44 added to claim:  Yes    Joanette Moynahan, RN 05/15/2024, 12:33 PM

## 2024-05-15 NOTE — Assessment & Plan Note (Deleted)
 No obvious acute stroke/LVO on CT, brain MRI also without evidence of acute abnormality. *** Does appear to be returning to baseline. - Admit to FMTS, attending Dr. Alverna John - Med surg, Vital signs per floor - Neurology consulted and has begun workup on pt - MRI Brain - EEG - PT/OT to treat - AM CBC, BMP  - Fall precautions - passed bedside swallow, will restart diet

## 2024-05-15 NOTE — Care Management Obs Status (Signed)
 MEDICARE OBSERVATION STATUS NOTIFICATION   Patient Details  Name: [name] MRN: [id number] Date of Birth: 1956-09-15   Medicare Observation Status Notification Given:  Yes    Joanette Moynahan, RN 05/15/2024, 12:33 PM

## 2024-05-15 NOTE — Assessment & Plan Note (Deleted)
 Suspect due to stroke versus seizure as above. Pt does have chronic left-sided deficits from prior stroke and craniotomy.  Appears to be returning to baseline.  Will continue to monitor. -Delirium precautions

## 2024-05-15 NOTE — ED Notes (Addendum)
 Patient used call button, spilled urinal on self. Patient cleaned, posterior skin assessed.  Bedding changed, new chucks, adult brief, and hospital gown applied to patient. Warm blankets given. Bed locked in lowest possible position, side rails up, call light within reach, patient watching TV.

## 2024-05-15 NOTE — Discharge Instructions (Addendum)
 Dear [name],   Thank you for letting us  participate in your care!  We are so glad you are feeling better.  You were hospitalized because of concern for stroke or seizure.  Fortunately all of your imaging did not show evidence of a stroke, and your EEG showed that you did not have a seizure while you were in the hospital being monitored.   Please continue to take all of your medications as prescribed; if you have any questions about your medications please reach out to your primary care doctor.  You should start taking Aspirin 81 mg every day.  POST-HOSPITAL & CARE INSTRUCTIONS We recommend following up with your PCP within 1 week from being discharged from the hospital. Please let PCP/Specialists know of any changes in medications that were made which you will be able to see in the medications section of this packet. Please also follow up with your Neurologist.  DOCTOR'S APPOINTMENTS & FOLLOW UP Future Appointments  Date Time Provider Department Center  10/06/2024  8:45 AM Gerald Hammed, NP [facility] None     Thank you for choosing [facility]! Take care and be well!  Family Medicine Teaching Service Inpatient Team [name]  [facility]  [address] [place], [place] [address] [phone]

## 2024-05-15 NOTE — Plan of Care (Addendum)
 Per primary team notes, patient back to baseline  MRI/EEG negative  Increase lamotrigine  to 125 mg nightly Add aspirin 81 mg daily  Neurology will sign off, please do not hesitate to reach out if further questions/concerns arise   Baldwin Levee MD-PhD [facility] [phone] Available 7 AM to 7 PM, outside these hours please contact Neurologist on call listed on AMION    No charge note

## 2024-05-15 NOTE — ED Notes (Signed)
 Spoke with Tabitha at [facility] states that when Code 44 is completed we may call her back and she will have staff pick pt up.

## 2024-05-15 NOTE — ED Notes (Addendum)
 Spoke with Mallie Seal), Mrs Stephenie Einstein requested update from Physician regarding to discharge.  Omar Bibber DO notified by secure chat.

## 2024-05-15 NOTE — Procedures (Signed)
 Patient Name: [name]  MRN: [id number]  Epilepsy Attending: Arleene Lack  Referring Physician/Provider: Imogene Mana, NP  Date: 05/15/2024 Duration: 37.36 mins  Patient history: 68 y.o. male with past history of brain tumor status post craniotomy and resection, epilepsy since craniotomy, developmental disability admitted for stroke rule out and altered mental status and weakness. EEG to evaluate for seizure  Level of alertness: Awake  AEDs during EEG study: LTG, VPA  Technical aspects: This EEG study was done with scalp electrodes positioned according to the 10-20 International system of electrode placement. Electrical activity was reviewed with band pass filter of 1-70Hz , sensitivity of 7 uV/mm, display speed of 42mm/sec with a 60Hz  notched filter applied as appropriate. EEG data were recorded continuously and digitally stored.  Video monitoring was available and reviewed as appropriate.  Description: The posterior dominant rhythm consists of 7.5 Hz activity of moderate voltage (25-35 uV) seen predominantly in posterior head regions, symmetric and reactive to eye opening and eye closing. Hyperventilation and photic stimulation were not performed.     ABNORMALITY - Background slow  IMPRESSION: This study is suggestive of mild diffuse encephalopathy. No seizures or epileptiform discharges were seen throughout the recording.  Isabeau Mccalla O Jasaun Carn

## 2024-05-15 NOTE — Evaluation (Signed)
 Physical Therapy Evaluation Patient Details Name: [name] MRN: [id number] DOB: 1956/12/15 Today's Date: 05/15/2024  History of Present Illness  Patient is a 68 y/o male seen in ED initially 05/14/24 with AMS and new onset weakness.  Thought to have breakthrough seizure with MRI negative for acute abnormality.  PMH positive for CVA with L hemiplegia, DD, seizure disorder, schizophrenia, GERD with Barrett's esophagus, HTN, h/o GIB, and hypothyroid.  Clinical Impression  Patient presents with decreased mobility from his reported baseline.  States he mobilized up to wheelchair at group home on his own.  Currently min A for transfer to chair then mod A for back to high stretcher in ED.  He will benefit from follow up outpatient PT at d/c.  Noted for d/c home so PT will not follow in the acute setting.         If plan is discharge home, recommend the following: A little help with walking and/or transfers;A lot of help with walking and/or transfers;Assistance with feeding;Assist for transportation   Can travel by private vehicle        Equipment Recommendations None recommended by PT  Recommendations for Other Services       Functional Status Assessment Patient has had a recent decline in their functional status and demonstrates the ability to make significant improvements in function in a reasonable and predictable amount of time.     Precautions / Restrictions Precautions Precautions: Fall Precaution/Restrictions Comments: L hemiparesis      Mobility  Bed Mobility Overal bed mobility: Needs Assistance Bed Mobility: Supine to Sit, Sit to Supine     Supine to sit: Min assist, HOB elevated Sit to supine: Mod assist, Used rails   General bed mobility comments: up to sit with HHA and pt scooting hips, to supine assist for L leg onto bed and repositioning trunk    Transfers Overall transfer level: Needs assistance   Transfers: Bed to chair/wheelchair/BSC     Step  pivot transfers: Min assist, Mod assist       General transfer comment: to the R to chair at side of stretcher min A for balance and chair stability; back to bed, encouraged to walk forward though sat on EOB with A for L side due to stretcher too tall    Ambulation/Gait               General Gait Details: declined  Stairs            Wheelchair Mobility     Tilt Bed    Modified Rankin (Stroke Patients Only)       Balance Overall balance assessment: Needs assistance   Sitting balance-Leahy Scale: Fair     Standing balance support: Single extremity supported Standing balance-Leahy Scale: Poor Standing balance comment: hand hold A for balance                             Pertinent Vitals/Pain Pain Assessment Pain Assessment: No/denies pain    Home Living Family/patient expects to be discharged to:: Group home Living Arrangements: Group Home Available Help at Discharge: Personal care attendant;Available 24 hours/day Type of Home: Group Home Home Access: Level entry       Home Layout: One level Home Equipment: Wheelchair - Forensic psychologist (2 wheels)      Prior Function Prior Level of Function : Patient poor historian/Family not available;Needs assist  Mobility Comments: Reports gets around in wheelchair at group home though completes transfers on his own and BADL's except showering       Extremity/Trunk Assessment   Upper Extremity Assessment Upper Extremity Assessment: Right hand dominant;LUE deficits/detail LUE Deficits / Details: grip weaker than R but can squeeze, noted some extensor tone and ataxic movements, though not specifically tested    Lower Extremity Assessment Lower Extremity Assessment: LLE deficits/detail LLE Deficits / Details: lifts antigravity though unable to flex the knee holding the leg up in extension and plantarflexion, unable to flex the ankle unaided, though PROM grossly WFL     Cervical / Trunk Assessment Cervical / Trunk Assessment: Kyphotic  Communication   Communication Communication: No apparent difficulties    Cognition Arousal: Alert Behavior During Therapy: Flat affect   PT - Cognitive impairments: No family/caregiver present to determine baseline, Safety/Judgement, Problem solving, Sequencing, Initiation, Attention, Orientation   Orientation impairments: Place, Time, Situation                   PT - Cognition Comments: knew it was 2025; declining mobility (just need to rest a little longer); needs cues and A For mobility tasks for safety Following commands: Impaired Following commands impaired: Follows one step commands inconsistently, Follows one step commands with increased time     Cueing Cueing Techniques: Verbal cues     General Comments General comments (skin integrity, edema, etc.): up to chair and assisted to change linen, pt requesting to "rest" and to use condom cath to void, though continued encouragement for up OOB; VSS throughout    Exercises     Assessment/Plan    PT Assessment All further PT needs can be met in the next venue of care  PT Problem List Decreased strength;Decreased balance;Decreased mobility;Decreased knowledge of use of DME       PT Treatment Interventions      PT Goals (Current goals can be found in the Care Plan section)  Acute Rehab PT Goals Patient Stated Goal: go home PT Goal Formulation: Patient unable to participate in goal setting Time For Goal Achievement: 05/29/24 Potential to Achieve Goals: Fair    Frequency       Co-evaluation               AM-PAC PT "6 Clicks" Mobility  Outcome Measure Help needed turning from your back to your side while in a flat bed without using bedrails?: A Little Help needed moving from lying on your back to sitting on the side of a flat bed without using bedrails?: A Little Help needed moving to and from a bed to a chair (including a  wheelchair)?: A Lot Help needed standing up from a chair using your arms (e.g., wheelchair or bedside chair)?: A Lot Help needed to walk in hospital room?: Total Help needed climbing 3-5 steps with a railing? : Total 6 Click Score: 12    End of Session Equipment Utilized During Treatment: Gait belt Activity Tolerance: Patient tolerated treatment well Patient left: in bed;with call bell/phone within reach   PT Visit Diagnosis: Other abnormalities of gait and mobility (R26.89)    Time: 9629-5284 PT Time Calculation (min) (ACUTE ONLY): 24 min   Charges:   PT Evaluation $PT Eval Moderate Complexity: 1 Mod PT Treatments $Therapeutic Activity: 8-22 mins PT General Charges $$ ACUTE PT VISIT: 1 Visit         Abigail Hoff, PT Acute Rehabilitation Services Office:[phone] 05/15/2024   [name] 05/15/2024, 12:21 PM

## 2024-05-16 ENCOUNTER — Telehealth: Payer: Self-pay | Admitting: Family Medicine

## 2024-05-16 ENCOUNTER — Other Ambulatory Visit: Payer: Self-pay | Admitting: Family Medicine

## 2024-05-16 MED ORDER — LAMOTRIGINE 25 MG PO TABS: 125.0000 mg | ORAL_TABLET | Freq: Every day | ORAL | 0 refills | Status: DC

## 2024-05-16 NOTE — Telephone Encounter (Signed)
 Called sister Stephenie Einstein, updated her that pt should be on Lamictal  125mg  daily instead of 100mg  (per Neuro recs). Sent updated prescription to [facility] on [address].

## 2024-05-19 LAB — LAMOTRIGINE LEVEL: Lamotrigine Lvl: 10.3 ug/mL (ref 2.0–20.0)

## 2024-05-25 ENCOUNTER — Encounter (HOSPITAL_COMMUNITY): Payer: Self-pay | Admitting: Emergency Medicine

## 2024-05-25 ENCOUNTER — Emergency Department (HOSPITAL_COMMUNITY)
Admission: EM | Admit: 2024-05-25 | Discharge: 2024-05-25 | Disposition: A | Discharge status: Home / Self Care | Attending: Emergency Medicine | Admitting: Emergency Medicine

## 2024-05-25 ENCOUNTER — Other Ambulatory Visit: Payer: Self-pay

## 2024-05-25 DIAGNOSIS — E039 Hypothyroidism, unspecified: Secondary | ICD-10-CM | POA: Insufficient documentation

## 2024-05-25 DIAGNOSIS — R569 Unspecified convulsions: Secondary | ICD-10-CM | POA: Diagnosis present

## 2024-05-25 DIAGNOSIS — Z79890 Hormone replacement therapy (postmenopausal): Secondary | ICD-10-CM | POA: Insufficient documentation

## 2024-05-25 DIAGNOSIS — Z7982 Long term (current) use of aspirin: Secondary | ICD-10-CM | POA: Insufficient documentation

## 2024-05-25 DIAGNOSIS — Z79899 Other long term (current) drug therapy: Secondary | ICD-10-CM | POA: Diagnosis not present

## 2024-05-25 DIAGNOSIS — I10 Essential (primary) hypertension: Secondary | ICD-10-CM | POA: Insufficient documentation

## 2024-05-25 LAB — CBG MONITORING, ED: Glucose-Capillary: 72 mg/dL (ref 70–99)

## 2024-05-25 MED ORDER — LAMOTRIGINE 25 MG PO TABS
125.0000 mg | ORAL_TABLET | Freq: Once | ORAL | Status: AC
  Administered 2024-05-25: 125 mg via ORAL
  Filled 2024-05-25: qty 2
  Filled 2024-05-25: qty 3
  Filled 2024-05-25: qty 5

## 2024-05-25 MED ORDER — LAMOTRIGINE 25 MG PO TABS: 125.0000 mg | ORAL_TABLET | Freq: Every day | ORAL | 0 refills | Status: DC

## 2024-05-25 NOTE — ED Notes (Signed)
 Ptar called

## 2024-05-25 NOTE — ED Notes (Signed)
 Pt sister reports going to visit pt and he was found in bed lethargic and slurring speech, episode lasted roughly 30 mins. Symptoms progressively resolved prior to arrival and pt is completely back to baseline per sister.

## 2024-05-25 NOTE — ED Notes (Signed)
 Unsuccessful iv start and blood draw start x4 Rns.

## 2024-05-25 NOTE — ED Provider Notes (Signed)
 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 Sister states she went to visit patient today and found him slow to respond and with more garbled speech. She reports this slowly resolved over a period. She reports this is similar to reason for recent admission during which patient was suspected to have had breakthrough seizure. She states patient's lamictal  was supposed to be increased from 100mg  at bedtime to 125mg  at bedtime when discharged but there was an issue with the prescription and patient has not been receiving this medication. No recent trauma. No sick symptoms, no other medication changes.     Home Medications Prior to Admission medications   Medication Sig Start Date End Date Taking? Authorizing Provider  lamoTRIgine  (LAMICTAL ) 25 MG tablet Take 5 tablets (125 mg total) by mouth at bedtime. 05/25/24 06/24/24 Yes Angele Keller, DO  Acetylcysteine (NAC) 500 MG CAPS Take 1 capsule by mouth 2 (two) times daily.    [provider]  aspirin  EC 81 MG tablet Take 1 tablet (81 mg total) by mouth daily. Swallow whole. 05/15/24 05/15/25  Omar Bibber, DO  benazepril (LOTENSIN) 5 MG tablet Take 5 mg by mouth daily. 06/05/23   [provider]  divalproex  (DEPAKOTE ) 500 MG DR tablet Take 1 tablet (500 mg total) by mouth every 12 (twelve) hours. 06/06/23 07/06/23  Oral Billings, MD  docusate sodium  (COLACE) 100 MG capsule Take 1 capsule (100 mg total) by mouth 2 (two) times daily. 12/06/20   Angiulli, Everlyn Hockey, PA-C  Emollient (CORN HUSKERS EX) Apply 1 application topically in  the morning and at bedtime. For dry skin    [provider]  levothyroxine  (SYNTHROID ) 175 MCG tablet Take 175 mcg by mouth daily. 04/05/23   [provider]  metoprolol  tartrate (LOPRESSOR ) 25 MG tablet Take 0.5 tablets (12.5 mg total) by mouth 2 (two) times daily. 06/06/23 07/06/23  Oral Billings, MD  pantoprazole  (PROTONIX ) 40 MG tablet Take 1 tablet (40 mg total) by mouth 2 (two) times daily before a meal. 12/06/20   Angiulli, Everlyn Hockey, PA-C  polyethylene glycol (MIRALAX  / GLYCOLAX ) 17 g packet Take 17 g by mouth 2 (two) times daily. 12/06/20   Angiulli, Everlyn Hockey, PA-C  QUEtiapine  (SEROQUEL ) 300 MG tablet Take 300 mg by mouth 2 (two) times daily. 05/06/24   [provider]  rosuvastatin  (CRESTOR ) 20 MG tablet Take 1 tablet (20 mg total) by mouth daily. 12/07/20   Angiulli, Everlyn Hockey, PA-C      Allergies    Vancomycin    Review of Systems   Review of Systems See HPI Physical Exam Updated Vital Signs BP 116/72   Pulse 73   Resp 14   Wt 66 kg   SpO2 97%   BMI 25.77 kg/m  Physical Exam Vitals and nursing note reviewed.  Constitutional:      General: He is not in acute distress.    Appearance: He is well-developed.  HENT:     Head: Normocephalic and atraumatic.     Mouth/Throat:     Mouth: Mucous membranes are moist.  Eyes:  Extraocular Movements: Extraocular movements intact.     Conjunctiva/sclera: Conjunctivae normal.     Pupils: Pupils are equal, round, and reactive to light.  Cardiovascular:     Rate and Rhythm: Normal rate and regular rhythm.     Heart sounds: No murmur heard. Pulmonary:     Effort: Pulmonary effort is normal. No respiratory distress.     Breath sounds: Normal breath sounds.  Abdominal:     Palpations: Abdomen is soft.     Tenderness: There is no abdominal tenderness.  Musculoskeletal:     Cervical back: Neck supple.     Comments: ROM at baseline for patient. Left hemiparesis   Skin:    General: Skin is warm and dry.      Capillary Refill: Capillary refill takes less than 2 seconds.  Neurological:     Mental Status: He is alert. Mental status is at baseline.  Psychiatric:        Mood and Affect: Mood normal.     ED Results / Procedures / Treatments   Labs (all labs ordered are listed, but only abnormal results are displayed) Labs Reviewed  CBG MONITORING, ED  CBG MONITORING, ED    EKG EKG Interpretation Date/Time:  Monday May 25 2024 22:17:12 EDT Ventricular Rate:  76 PR Interval:  102 QRS Duration:  76 QT Interval:  376 QTC Calculation: 423 R Axis:   37  Text Interpretation: Sinus rhythm with short PR Nonspecific T wave abnormality Abnormal ECG When compared with ECG of 14-May-2024 15:16, No significant change was found Confirmed by Alissa April ([id number]) on 05/25/2024 11:34:25 PM   Medications Ordered in ED Medications  lamoTRIgine  (LAMICTAL ) tablet 125 mg (125 mg Oral Given 05/25/24 2300)    ED Course/ Medical Decision Making/ A&P   [name] is a 68 y.o.male with a history of 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  Patient likely with seizure and postictal state due to lack of medication change.  Patient has returned to normal mental status. No concern for trauma, infectious or ischemic etiology at this time.  Patient received full lab work, MRI CTA H&N and CT code stroke 5/22 which was unremarkable. I do not feel further lab work and repeat imaging indicated at this time. Discussed this with sister/POA who is at bedside and she agrees with this plan.  POC BG WNL. EKG NSR. No evidence of arrhythmia or ischemia.  Patient received 125mg  lamictal  in ED. New prescription provided. Sister will schedule neurology follow-up outpatient. Patient stable for  discharge. Return precautions provided. Family agrees with plan.   Final Clinical Impression(s) / ED Diagnoses Final diagnoses:  Seizure [facility])    Rx / DC Orders ED Discharge Orders          Ordered    lamoTRIgine  (LAMICTAL ) 25 MG tablet  Daily at bedtime        05/25/24 2311              Angele Keller, DO 05/25/24 2346    Flonnie Humphrey, DO 05/25/24 2358

## 2024-05-25 NOTE — Discharge Instructions (Signed)
 Please administer Lamictal  125mg  at bedtime daily.  Please follow-up with neurology as soon as possible.   Please follow-up with your primary doctor within the next 2-3 days.  Please return to the Emergency Department or call 911 if you experience chest pain, shortness of breath, severe pain, severe fever, altered mental status, or have any reason to think that you need emergency medical care.  Thank you again.  Hope you feel better soon.  Department of Emergency Medicine

## 2024-05-25 NOTE — ED Triage Notes (Signed)
 Patient BIB [facility] from home c/o cognitive decline, AMS from baseline (grumbling with responses, would not engage with family).  Patient was evaluated last week for same and diagnosed with TIA per EMS.  Per EMS patient is now back to baseline.   112/80 18 RR 97% RA 143 CBG

## 2024-05-27 ENCOUNTER — Encounter: Payer: Self-pay | Admitting: Neurology

## 2024-05-27 ENCOUNTER — Ambulatory Visit (INDEPENDENT_AMBULATORY_CARE_PROVIDER_SITE_OTHER): Admitting: Neurology

## 2024-05-27 VITALS — BP 114/63 | HR 85 | Resp 15

## 2024-05-27 DIAGNOSIS — G40909 Epilepsy, unspecified, not intractable, without status epilepticus: Secondary | ICD-10-CM | POA: Diagnosis not present

## 2024-05-27 DIAGNOSIS — I69954 Hemiplegia and hemiparesis following unspecified cerebrovascular disease affecting left non-dominant side: Secondary | ICD-10-CM

## 2024-05-27 DIAGNOSIS — F209 Schizophrenia, unspecified: Secondary | ICD-10-CM

## 2024-05-27 DIAGNOSIS — R4182 Altered mental status, unspecified: Secondary | ICD-10-CM

## 2024-05-27 DIAGNOSIS — Z79899 Other long term (current) drug therapy: Secondary | ICD-10-CM

## 2024-05-27 MED ORDER — LAMOTRIGINE 25 MG PO TABS: 125.0000 mg | ORAL_TABLET | Freq: Every day | ORAL | 11 refills | Status: AC

## 2024-05-27 NOTE — Patient Instructions (Signed)
 Recommend giving higher dose Lamictal  125 mg at bedtime for seizures. Continue the Depakote   Check labs today  Recommend checking urine at facility to screen for infection  Recommend PT/OT Call for any seizure activity

## 2024-05-27 NOTE — Progress Notes (Signed)
 Chief Complaint  Patient presents with   Hospitalization Follow-up    Rm14, house supervisor of group home present, Hosp f/u Seizures: ER twice in past 2 weeks, unable to do ADL's for himself.    ASSESSMENT AND PLAN  [name] is a 68 y.o. male   1.  History of benign intracranial tumor surgery, stroke perioperative period of time in Jun 29, 2003, with residual mild spastic left hemiparesis 2.  Complex partial seizure 3.  Schizophrenia 4.  Polypharmacy treatment  -2 ER visits for increased weakness, increased garbled speech.  Felt to be due to breakthrough seizure.  MRI of the brain was negative.  Added aspirin  81 mg daily.  Lamictal  was increased 125 mg at bedtime, continue Depakote  DR 500 mg twice daily.   - Lamictal  level 10.3, Depakote  88  - EEG showed diffuse encephalopathy.  - Lamictal  dose has yet to be increased, I called the pharmacy, prescription ready for pickup, group home will pick up and start tonight, Lamictal  125 mg at bedtime -Sister & POA, [name], requested drug screen, previously last year tested positive for benzos not on medication record. Also check CBC, CMP - Advise facility to check for urine infection, continues with general weakness  - Continue follow-up with psychiatry who manages Seroquel  - Consider repeat EEG if continued symptoms of weakness, garbled speech - Continue physical therapy, Occupational Therapy at group home - Call for breakthrough seizure, follow-up in 6 months with Dr. Gracie Lav  Orders Placed This Encounter  Procedures   CBC with Differential/Platelet   CMP   Drug Screen 10 W/Conf, Serum    DIAGNOSTIC DATA (LABS, IMAGING, TESTING) - I reviewed patient records, labs, notes, testing and imaging myself where available.  CT head without contrast from [facility] in April 2019: Encephalomalacia of right frontal lobe with small vessel disease in the superior right centrum semiovale, stable parietal craniotomy of the right and  left superior frontal region,  Echocardiogram December 2021: Ejection fraction 65 to 70%  EEG October 21, 2020, mild to moderate diffuse encephalopathy, no epileptiform discharge  HISTORICAL data  [name] a 68 year old male, seen in request by his primary care PA [name], [name], PA for evaluation of passing out spells, he is accompanied by his sister [name], who is also his power of attorney at today's visit on March 09, 2021.  I reviewed and summarized the referring note.  Past medical history Stroke with residual left side weakness, Seizure, Intellectual delay Dysphagia, Barrett's esophagus, GERD with gastroparesis, Hypothyroidism, on supplement Hypertension Hyperlipidemia Schizophrenia Diabetes History of Craniotomy,   Patient had a history of craniotomy for benign brain tumor removal in 06-29-03 at [facility], postsurgically, he developed complications, required a second surgery, which is further complicated by stroke, with residual left-sided weakness.  The history is from his sister, I could not find the medical records through epic system  Patient also had lifelong history of schizophrenia, developmentally delayed, intellectual disability, lived with his father, who passed away in 06/29/07, eventually he was placed at a group home since 06-29-11,  He began to have seizure following his craniotomy in Jun 29, 2003, was managed by outside neurologist, has been seizure-free for more than 10 years, stable on current medication of Depakote  DR 500 mg twice a day, and lamotrigine  100 mg daily  In September 2021, he was found to be confused, altered mental status, hypotension, that was improved with hospital admission, hydration, hospital admission again in November 2021, for aspiration pneumonia, eventually was  diagnosed with massive dilated esophagus, periesophagus hernia, underwent robotic assistant laparoscopy, paraesophageal hernia repair, followed by prolonged  rehabilitation,  His condition has gradually stabilized since, during the process, he was noted to have confusion, near syncope episode, fall at nursing home, there was no clinical seizure activity noted  Since the surgery, he has much improved, almost back to his baseline, he has unsteady gait, carry on simple conversation, has good appetite, residual left hemiparesis  UPDATE Oct 26 2021: He is accompanied by his group home staff Thomas Zigbuo at today's visit, who has known patient for 1 year, patient is overall doing well, has back to his baseline, have good appetite, sleeping well, continue have language difficulty, gait abnormality, no seizure-like activity  UPDate April 01 2023: He was accompanied by group home staff Norene Beards at visit, who has known him for 2 months, described his difficulty using left side, which was actually present at previous examination, I tried to call his sister, power of attorney [name] without success,  Personally reviewed MRI of the brain September 2022, no acute abnormality, large cystic encephalomalacia at the right frontal lobe,  I reviewed emergency room visit on March 22, 2023,  he was taken to the emergency room for weakness, difficulty using his left side, sister is guardian, filed a complaining of non accidental trauma at a group home, Depakote  level was low 43,,    Lab from March 15, 2023, Depakote  level was 43, lamotrigine  was 8.4, normal thyroid  functional test, CBC, CMP,  UPDATE April 23 2023: He is accompanied by group home staff at today's visit, hospital admission on April 22 for worsening left-sided weakness, confusion suspicious for seizure, Depakote  was increased from 500 to 750 mg twice a day  Personally reviewed MRI of the brain April 10, 2023, no acute abnormality, resection cavity with surrounding encephalomalacia gliosis at the right frontal lobe  CT angiogram of head and neck showed no large vessel disease  Laboratory evaluations,  normal CBC, hemoglobin of  15.4, BMP, calcium  of 8.6  Overnight EEG with video April 17 continues 3 to 6 Hz theta delta slowing at the right frontal region, but no epileptiform discharge  Laboratory evaluations in April 2024 normal TSH RPR B12 folic acid  B1, A1c HIV, lamotrigine  level was 10, Depakote  80  UPDATE July 2024: Hospital admission again in June 04, 2023, group home reported slurred speech, sister also described IV rolling backwards, transient unresponsiveness, confused for 3 days  Laboratory showed worsening creatinine 1.6 from baseline of 1.13, ammonia level was normal, personally reviewed MRI, right frontal encephalomalacia, no acute abnormality UDS was positive for benzodiazepine, confirmed by blood test, for his Nicky Barrack, there was no record of benzodiazepine use in his medical record  Lab also showed slight elevation of TSH 5.3, lamotrigine  level 8.1, Depakote  level 49,  Seroquel  was decreased from 400 to 200mg  bid since hospital discharge on June 13, remained on Depakote  DR 500 mg twice a day lamotrigine  100 mg once a day  He is overall doing better, more alert with the lower dose of Seroquel , no confusion /passing out spell,  Over the past few months, he was noted to have increased gait abnormality, dragging left leg more  Personally reviewed MRI of the brain June 05, 2023, no acute abnormality, right frontal encephalomalacia, moderate small vessel disease  Update October 07, 2023 SS: Doing well at group home, here with aide. No seizure events. Remains on Depakote  DR 500 mg BID, Lamictal  100 mg PM, Seroquel  300 mg  BID for behaviors. Doing more for himself, dressing, tolieting. Needs help with showering. No falls. Mood has been good. Seroquel  was increased by psych few weeks ago, unclear reason? PCP is refilling all medications.   Update May 27, 2024 SS: In the ER 5/22-5/23 for left-sided weakness and altered mental status (general weakness unable to push himself in  wheelchair or transfer to the toilet, little harder to understand).  EEG showed diffuse encephalopathy.  MRI of the brain was negative.  Increased Lamictal  125 mg at bedtime, added aspirin  81 mg daily.  Lamictal  level 10.3, Depakote  88.  Back to the ER 05/25/2024, he was slow to respond with more garbled speech, resolved after about 30 minutes.  His Lamictal  dose had yet to be increased.  Called his group home, his Lamictal  has yet to increased to 125 mg at bedtime, still on 100 mg, gets pill packs. His speech is still garbled, still generally weak, needs 2 person assist, weakness has been ongoing since last June with hospitalization. Before, right arm was strong enough to pull himself. He was refusing PT, has been doing several rounds. Patient's sister, [name] is here today.   PHYSICAL EXAM   Vitals:   05/27/24 1428  BP: 114/63  Pulse: 85  Resp: 15  SpO2: 97%   There is no height or weight on file to calculate BMI.  PHYSICAL EXAMNIATION:  Gen: NAD, conversant, well nourised, well groomed         NEUROLOGICAL EXAM:  MENTAL STATUS: Speech/cognition: Dysarthria, relies on sister for history cooperative on examination, following simple command,  aware of self, place, gives basic info   CRANIAL NERVES: CN II: Visual fields are full to confrontation. Pupils are round equal and briskly reactive to light. CN III, IV, VI: extraocular movement are normal. No ptosis. CN V: Facial sensation is intact to light touch CN VII: Face is symmetric with normal eye closure  CN VIII: Hearing is normal to causal conversation. CN IX, X: Phonation is normal. CN XI: Head turning and shoulder shrug are intact  MOTOR: Spastic left hemiparesis, antigravity movement of left upper extremity, spasticity left lower extremity, 3/5 movement to left leg. Noted fidgety movement, reposition of hands/rolling wrists, reposition himself in chair  REFLEXES: Hyperreflexia on the left side  SENSORY: Intact bilaterally to  the face, arm, legs  COORDINATION: Dysmetria with finger-nose-finger bilaterally, difficulty with the left arm due to spasticity, but was able to relax the left arm with PROM  GAIT/STANCE: Able to stand from seated position with pushoff, he is unsteady, tends to lean backwards, was not further ambulated, in wheelchair  REVIEW OF SYSTEMS: Full 14 system review of systems performed and notable only for as above All other review of systems were negative.  ALLERGIES: Allergies  Allergen Reactions   Vancomycin Itching and Other (See Comments)    Unknown-possible hives      HOME MEDICATIONS: Current Outpatient Medications  Medication Sig Dispense Refill   acetaminophen  (TYLENOL ) 325 MG tablet Take 650 mg by mouth every 4 (four) hours as needed.     aspirin  EC 81 MG tablet Take 1 tablet (81 mg total) by mouth daily. Swallow whole. 30 tablet 11   benazepril (LOTENSIN) 5 MG tablet Take 5 mg by mouth daily.     bisacodyl  (DULCOLAX) 10 MG suppository Place 10 mg rectally as needed for moderate constipation.     diphenhydrAMINE  (BENADRYL ) 25 mg capsule Take 25 mg by mouth every 6 (six) hours as needed for itching, allergies or  sleep.     divalproex  (DEPAKOTE ) 500 MG DR tablet Take 1 tablet (500 mg total) by mouth every 12 (twelve) hours. 60 tablet 0   docusate sodium  (COLACE) 100 MG capsule Take 1 capsule (100 mg total) by mouth 2 (two) times daily. 10 capsule 0   Emollient (CORN HUSKERS EX) Apply 1 application topically in the morning and at bedtime. For dry skin     Ensure (ENSURE) Take 237 mLs by mouth.     guaiFENesin  (ROBITUSSIN) 100 MG/5ML liquid Take 5 mLs by mouth every 4 (four) hours as needed for cough or to loosen phlegm.     levothyroxine  (SYNTHROID ) 175 MCG tablet Take 175 mcg by mouth daily before breakfast.     loperamide (IMODIUM) 2 MG capsule Take 2 mg by mouth as needed for diarrhea or loose stools.     Magnesium Hydroxide (MILK OF MAGNESIA PO) Take 30 mLs by mouth daily as  needed.     metoprolol  tartrate (LOPRESSOR ) 25 MG tablet Take 0.5 tablets (12.5 mg total) by mouth 2 (two) times daily. 30 tablet 0   mineral oil enema Place 1 enema rectally daily as needed for severe constipation.     neomycin-bacitracin -polymyxin (NEOSPORIN) [phone] ointment Apply 1 Application topically in the morning and at bedtime.     ondansetron  (ZOFRAN ) 4 MG tablet Take 4 mg by mouth every 8 (eight) hours as needed for nausea or vomiting.     pantoprazole  (PROTONIX ) 40 MG tablet Take 1 tablet (40 mg total) by mouth 2 (two) times daily before a meal. 60 tablet 0   polyethylene glycol (MIRALAX  / GLYCOLAX ) 17 g packet Take 17 g by mouth 2 (two) times daily. 14 each 0   promethazine (PHENERGAN) 25 MG suppository Place 25 mg rectally every 6 (six) hours as needed for nausea or vomiting.     QUEtiapine  (SEROQUEL ) 300 MG tablet Take 300 mg by mouth 2 (two) times daily.     rosuvastatin  (CRESTOR ) 20 MG tablet Take 1 tablet (20 mg total) by mouth daily. 30 tablet 0   lamoTRIgine  (LAMICTAL ) 25 MG tablet Take 5 tablets (125 mg total) by mouth at bedtime. 150 tablet 11   No current facility-administered medications for this visit.    PAST MEDICAL HISTORY: Past Medical History:  Diagnosis Date   Barrett esophagus    DM II (diabetes mellitus, type II), controlled (HCC) 11/13/2020   Dysphagia    Gastroparesis    GERD (gastroesophageal reflux disease)    Hypercholesteremia    Hypertension    Iron deficiency anemia    Mental retardation    Schizophrenia (HCC)    Seizure (HCC)    Stroke [facility])     PAST SURGICAL HISTORY: Past Surgical History:  Procedure Laterality Date   BIOPSY  10/21/2020   Procedure: BIOPSY;  Surgeon: Kenney Peacemaker, MD;  Location: [facility] ENDOSCOPY;  Service: Endoscopy;;   CRANIOTOMY FOR CYST FENESTRATION     ESOPHAGOGASTRODUODENOSCOPY N/A 01/09/2015   Procedure: ESOPHAGOGASTRODUODENOSCOPY (EGD);  Surgeon: Claudette Cue, MD;  Location: [facility] ENDOSCOPY;  Service:  Endoscopy;  Laterality: N/A;   ESOPHAGOGASTRODUODENOSCOPY N/A 10/26/2020   Procedure: ESOPHAGOGASTRODUODENOSCOPY (EGD);  Surgeon: Hilarie Lovely, MD;  Location: [facility] OR;  Service: Thoracic;  Laterality: N/A;   ESOPHAGOGASTRODUODENOSCOPY (EGD) WITH PROPOFOL  N/A 10/21/2020   Procedure: ESOPHAGOGASTRODUODENOSCOPY (EGD) WITH PROPOFOL ;  Surgeon: Kenney Peacemaker, MD;  Location: [facility] ENDOSCOPY;  Service: Endoscopy;  Laterality: N/A;   IR GASTR TUBE CONVERT GASTR-JEJ PER W/FL MOD SED  11/07/2020  PEG PLACEMENT N/A 10/26/2020   Procedure: PERCUTANEOUS ENDOSCOPIC GASTROSTOMY (PEG) PLACEMENT;  Surgeon: Hilarie Lovely, MD;  Location: [facility] OR;  Service: Thoracic;  Laterality: N/A;   XI ROBOTIC ASSISTED HIATAL HERNIA REPAIR N/A 10/26/2020   Procedure: XI ROBOTIC ASSISTED HIATAL HERNIA REPAIR;  Surgeon: Hilarie Lovely, MD;  Location: [facility] OR;  Service: Thoracic;  Laterality: N/A;    FAMILY HISTORY: Family History  Problem Relation Age of Onset   Breast cancer Mother    Heart disease Father     SOCIAL HISTORY: Social History   Socioeconomic History   Marital status: Single    Spouse name: Not on file   Number of children: 0   Years of education: 12   Highest education level: High school graduate  Occupational History   Occupation: Disabled  Tobacco Use   Smoking status: Never   Smokeless tobacco: Never  Vaping Use   Vaping status: Never Used  Substance and Sexual Activity   Alcohol use: Not Currently   Drug use: Never   Sexual activity: Not Currently  Other Topics Concern   Not on file  Social History Narrative   Lives at [facility] group home (ph: [phone]).   Left-handed.   No daily caffeine use.   Uses wheel chair but working w/pt and ot to get back with walker 06/26/23   18kcal heart healthy diet thin liquid aspiration precautions meds whole in applesauce    Social Drivers of Health   Financial Resource Strain: Not on file  Food Insecurity: No Food Insecurity (06/05/2023)    Hunger Vital Sign    Worried About Running Out of Food in the Last Year: Never true    Ran Out of Food in the Last Year: Never true  Transportation Needs: No Transportation Needs (06/05/2023)   PRAPARE - Administrator, Civil Service (Medical): No    Lack of Transportation (Non-Medical): No  Physical Activity: Not on file  Stress: Not on file  Social Connections: Not on file  Intimate Partner Violence: Not At Risk (06/05/2023)   Humiliation, Afraid, Rape, and Kick questionnaire    Fear of Current or Ex-Partner: No    Emotionally Abused: No    Physically Abused: No    Sexually Abused: No   Cortland Ding, DNP  [facility] [address], [address] [place], [place] [address] [phone]

## 2024-05-28 LAB — CBC WITH DIFFERENTIAL/PLATELET
Basophils Absolute: 0 10*3/uL (ref 0.0–0.2)
Basos: 0 %
EOS (ABSOLUTE): 0.1 10*3/uL (ref 0.0–0.4)
Eos: 2 %
Hematocrit: 40.1 % (ref 37.5–51.0)
Hemoglobin: 12.8 g/dL — ABNORMAL LOW (ref 13.0–17.7)
Immature Grans (Abs): 0 10*3/uL (ref 0.0–0.1)
Immature Granulocytes: 0 %
Lymphocytes Absolute: 1.5 10*3/uL (ref 0.7–3.1)
Lymphs: 33 %
MCH: 30.9 pg (ref 26.6–33.0)
MCHC: 31.9 g/dL (ref 31.5–35.7)
MCV: 97 fL (ref 79–97)
Monocytes Absolute: 0.5 10*3/uL (ref 0.1–0.9)
Monocytes: 10 %
Neutrophils Absolute: 2.6 10*3/uL (ref 1.4–7.0)
Neutrophils: 55 %
Platelets: 205 10*3/uL (ref 150–450)
RBC: 4.14 x10E6/uL (ref 4.14–5.80)
RDW: 13.1 % (ref 11.6–15.4)
WBC: 4.7 10*3/uL (ref 3.4–10.8)

## 2024-05-28 LAB — COMPREHENSIVE METABOLIC PANEL WITH GFR
ALT: 23 IU/L (ref 0–44)
AST: 54 IU/L — ABNORMAL HIGH (ref 0–40)
Albumin: 4 g/dL (ref 3.9–4.9)
Alkaline Phosphatase: 74 IU/L (ref 44–121)
BUN/Creatinine Ratio: 15 (ref 10–24)
BUN: 19 mg/dL (ref 8–27)
Bilirubin Total: 0.3 mg/dL (ref 0.0–1.2)
CO2: 23 mmol/L (ref 20–29)
Calcium: 8.7 mg/dL (ref 8.6–10.2)
Chloride: 102 mmol/L (ref 96–106)
Creatinine, Ser: 1.28 mg/dL — ABNORMAL HIGH (ref 0.76–1.27)
Globulin, Total: 2.5 g/dL (ref 1.5–4.5)
Glucose: 99 mg/dL (ref 70–99)
Potassium: 3.9 mmol/L (ref 3.5–5.2)
Sodium: 142 mmol/L (ref 134–144)
Total Protein: 6.5 g/dL (ref 6.0–8.5)
eGFR: 62 mL/min/{1.73_m2} (ref >59)

## 2024-05-29 NOTE — Progress Notes (Signed)
 Chart reviewed, agree above plan ?

## 2024-06-03 ENCOUNTER — Ambulatory Visit: Payer: Self-pay | Admitting: Neurology

## 2024-06-03 LAB — DRUG SCREEN 10 W/CONF, SERUM
Amphetamines, IA: NEGATIVE ng/mL
Barbiturates, IA: NEGATIVE ug/mL
Benzodiazepines, IA: NEGATIVE ng/mL
Cocaine & Metabolite, IA: NEGATIVE ng/mL
Methadone, IA: NEGATIVE ng/mL
Opiates, IA: NEGATIVE ng/mL
Oxycodones, IA: NEGATIVE ng/mL
Phencyclidine, IA: NEGATIVE ng/mL
Propoxyphene, IA: NEGATIVE ng/mL
THC(Marijuana) Metabolite, IA: NEGATIVE ng/mL

## 2024-06-29 ENCOUNTER — Other Ambulatory Visit: Payer: Self-pay

## 2024-06-29 ENCOUNTER — Emergency Department (HOSPITAL_COMMUNITY)

## 2024-06-29 ENCOUNTER — Encounter (HOSPITAL_COMMUNITY): Payer: Self-pay

## 2024-06-29 ENCOUNTER — Emergency Department (HOSPITAL_COMMUNITY)
Admission: EM | Admit: 2024-06-29 | Discharge: 2024-06-30 | Disposition: A | Discharge status: Home / Self Care | Source: Home / Self Care | Attending: Emergency Medicine | Admitting: Emergency Medicine

## 2024-06-29 DIAGNOSIS — Z7984 Long term (current) use of oral hypoglycemic drugs: Secondary | ICD-10-CM | POA: Insufficient documentation

## 2024-06-29 DIAGNOSIS — Z79899 Other long term (current) drug therapy: Secondary | ICD-10-CM | POA: Insufficient documentation

## 2024-06-29 DIAGNOSIS — E119 Type 2 diabetes mellitus without complications: Secondary | ICD-10-CM | POA: Insufficient documentation

## 2024-06-29 DIAGNOSIS — R4182 Altered mental status, unspecified: Secondary | ICD-10-CM | POA: Insufficient documentation

## 2024-06-29 DIAGNOSIS — Z7982 Long term (current) use of aspirin: Secondary | ICD-10-CM | POA: Insufficient documentation

## 2024-06-29 DIAGNOSIS — I10 Essential (primary) hypertension: Secondary | ICD-10-CM | POA: Insufficient documentation

## 2024-06-29 DIAGNOSIS — E039 Hypothyroidism, unspecified: Secondary | ICD-10-CM | POA: Insufficient documentation

## 2024-06-29 DIAGNOSIS — G928 Other toxic encephalopathy: Secondary | ICD-10-CM | POA: Diagnosis not present

## 2024-06-29 LAB — CBC WITH DIFFERENTIAL/PLATELET

## 2024-06-29 NOTE — ED Triage Notes (Signed)
 PT brought in by [facility] for AMS, PT last known well was 1600 today. PT had a HX of stroke on the left side that's flaccid. PT VSS and is not answering or talking per normal per group home rep. PT arrived from [facility] group home.

## 2024-06-29 NOTE — ED Provider Notes (Addendum)
 [place] EMERGENCY DEPARTMENT AT [facility] Provider Note   CSN: [id number] Arrival date & time: 06/29/24  2218     Patient presents with: Altered Mental Status   [name] is a 68 y.o. male.  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 Patient comes from a facility.  Caretaker reports around 4pm he went into his room at approximately 9 his caretaker checked on him and noticed he had garbled speech and was slow to respond.  Caretaker reports this is similar to prior visits for similar complaint and suspected to be secondary to seizure. Reports since arrival seems to be moving right side a little better.  No seizure-like activity or fall was noted today.  No change in medications.  No sick contacts.       Altered Mental Status      Prior to Admission medications   Medication Sig Start Date End Date Taking? Authorizing Provider  acetaminophen  (TYLENOL ) 325 MG tablet Take 650 mg by mouth every 4 (four) hours as needed.    [provider]  aspirin  EC 81 MG tablet Take 1 tablet (81 mg total) by mouth daily. Swallow whole. 05/15/24 05/15/25  Lafe Domino, DO  benazepril (LOTENSIN) 5 MG tablet Take 5 mg by mouth daily. 06/05/23   [provider]  bisacodyl  (DULCOLAX) 10 MG suppository Place 10 mg rectally as needed for moderate constipation.    [provider]  diphenhydrAMINE  (BENADRYL ) 25 mg capsule Take 25 mg by mouth every 6 (six) hours as needed for itching, allergies or sleep.    [provider]  divalproex  (DEPAKOTE ) 500 MG DR tablet Take 1 tablet (500 mg total) by mouth every 12 (twelve) hours. 06/06/23 05/27/25  Cindy Garnette POUR, MD  docusate sodium  (COLACE) 100 MG capsule Take 1 capsule (100 mg total) by  mouth 2 (two) times daily. 12/06/20   Angiulli, Toribio PARAS, PA-C  Emollient (CORN HUSKERS EX) Apply 1 application topically in the morning and at bedtime. For dry skin    [provider]  Ensure (ENSURE) Take 237 mLs by mouth.    [provider]  guaiFENesin  (ROBITUSSIN) 100 MG/5ML liquid Take 5 mLs by mouth every 4 (four) hours as needed for cough or to loosen phlegm.    [provider]  lamoTRIgine  (LAMICTAL ) 25 MG tablet Take 5 tablets (125 mg total) by mouth at bedtime. 05/27/24 05/22/25  Gayland Domino PARAS, NP  levothyroxine  (SYNTHROID ) 175 MCG tablet Take 175 mcg by mouth daily before breakfast.    [provider]  loperamide (IMODIUM) 2 MG capsule Take 2 mg by mouth as needed for diarrhea or loose stools.    [provider]  Magnesium Hydroxide (MILK OF MAGNESIA PO) Take 30 mLs by mouth daily as needed.    [provider]  metoprolol  tartrate (LOPRESSOR ) 25 MG tablet Take 0.5 tablets (12.5 mg total) by mouth 2 (two) times daily. 06/06/23 05/27/25  Cindy Garnette POUR, MD  mineral oil enema Place 1 enema rectally daily as needed for severe constipation.    [provider]  neomycin-bacitracin -polymyxin (NEOSPORIN) [id number] ointment Apply 1 Application topically in the morning and at bedtime.    [provider]  ondansetron  (ZOFRAN ) 4 MG tablet Take 4 mg by mouth every 8 (eight) hours as needed for nausea or vomiting.  [provider]  pantoprazole  (PROTONIX ) 40 MG tablet Take 1 tablet (40 mg total) by mouth 2 (two) times daily before a meal. 12/06/20   Angiulli, Toribio PARAS, PA-C  polyethylene glycol (MIRALAX  / GLYCOLAX ) 17 g packet Take 17 g by mouth 2 (two) times daily. 12/06/20   Angiulli, Toribio PARAS, PA-C  promethazine (PHENERGAN) 25 MG suppository Place 25 mg rectally every 6 (six) hours as needed for nausea or vomiting.    [provider]  QUEtiapine  (SEROQUEL ) 300 MG tablet Take 300 mg by mouth 2 (two) times daily.  05/06/24   [provider]  rosuvastatin  (CRESTOR ) 20 MG tablet Take 1 tablet (20 mg total) by mouth daily. 12/07/20   Angiulli, Toribio PARAS, PA-C    Allergies: Vancomycin    Review of Systems  Updated Vital Signs BP 112/73   Pulse 72   Temp 98.1 F (36.7 C)   Resp 18   Ht 5' 8 (1.727 m)   Wt 65.8 kg   SpO2 100%   BMI 22.05 kg/m   Physical Exam Vitals and nursing note reviewed.  Constitutional:      General: He is not in acute distress.    Appearance: He is not toxic-appearing.     Comments: Alert & oriented to self which is baseline  HENT:     Head: Normocephalic and atraumatic.  Eyes:     General: No scleral icterus.    Conjunctiva/sclera: Conjunctivae normal.  Cardiovascular:     Rate and Rhythm: Normal rate and regular rhythm.     Pulses: Normal pulses.     Heart sounds: Normal heart sounds.  Pulmonary:     Effort: Pulmonary effort is normal. No respiratory distress.     Breath sounds: Normal breath sounds.  Abdominal:     General: Abdomen is flat. Bowel sounds are normal.     Palpations: Abdomen is soft.     Tenderness: There is no abdominal tenderness.  Skin:    General: Skin is warm and dry.     Findings: No lesion.  Neurological:     General: No focal deficit present.     Mental Status: He is alert and oriented to person, place, and time. Mental status is at baseline.     Comments: Left-sided hemiparesis which is baseline for patient. Strong grip strength on right hand.  Able to hold arm and leg against gravity.  No obvious facial droop.     (all labs ordered are listed, but only abnormal results are displayed) Labs Reviewed  COMPREHENSIVE METABOLIC PANEL WITH GFR - Abnormal; Notable for the following components:      Result Value   Potassium 3.3 (*)    Glucose, Bld 149 (*)    Albumin  3.3 (*)    All other components within normal limits  CBC WITH DIFFERENTIAL/PLATELET - Abnormal; Notable for the following components:   WBC 2.6 (*)    RBC  3.92 (*)    Hemoglobin 12.3 (*)    HCT 35.7 (*)    Platelets 104 (*)    Neutro Abs 0.6 (*)    All other components within normal limits  VALPROIC  ACID LEVEL - Abnormal; Notable for the following components:   Valproic  Acid Lvl 128 (*)    All other components within normal limits  CBG MONITORING, ED - Abnormal; Notable for the following components:   Glucose-Capillary 104 (*)    All other components within normal limits  MAGNESIUM  URINALYSIS, ROUTINE W REFLEX MICROSCOPIC  ETHANOL  RAPID URINE DRUG SCREEN, HOSP PERFORMED  LAMOTRIGINE  LEVEL    EKG: EKG Interpretation Date/Time:  Monday June 29 2024 23:59:06 EDT Ventricular Rate:  69 PR Interval:  119 QRS Duration:  112 QT Interval:  531 QTC Calculation: 569 R Axis:   48  Text Interpretation: Sinus rhythm Borderline short PR interval Borderline intraventricular conduction delay Borderline T wave abnormalities Prolonged QT interval No significant change since last tracing Confirmed by Lorette Mayo [phone]) on 06/30/2024 12:42:36 AM  Radiology: CT Head Wo Contrast Result Date: 06/29/2024 CLINICAL DATA:  Mental status change, unknown cause EXAM: CT HEAD WITHOUT CONTRAST TECHNIQUE: Contiguous axial images were obtained from the base of the skull through the vertex without intravenous contrast. RADIATION DOSE REDUCTION: This exam was performed according to the departmental dose-optimization program which includes automated exposure control, adjustment of the mA and/or kV according to patient size and/or use of iterative reconstruction technique. COMPARISON:  CT head May 14, 2024. FINDINGS: Brain: No evidence of acute infarction, hemorrhage, hydrocephalus, extra-axial collection or mass lesion/mass effect. Right frontal encephalomalacia. Vascular: No hyperdense vessel.  Calcific atherosclerosis. Skull: No acute fracture. Sinuses/Orbits: Sinuses.  No acute orbital findings. Other: No mastoid effusions. IMPRESSION: No evidence of acute  intracranial abnormality. Right frontal encephalomalacia. Electronically Signed   By: Gilmore GORMAN Molt M.D.   On: 06/29/2024 23:52     Procedures   Medications Ordered in the ED - No data to display                                  Medical Decision Making Amount and/or Complexity of Data Reviewed Labs: ordered. Radiology: ordered.   This patient presents to the ED for concern of AMS, this involves an extensive number of treatment options, and is a complaint that carries with it a high risk of complications and morbidity.  The differential diagnosis includes seizure, CVA, electrolyte abnormality, URI, PNA, UTI, dehydration, fall.    Co morbidities that complicate the patient evaluation  Seizure CVA DM   Additional history obtained:  Additional history obtained from ED visit 05/25/24 and 05/14/24    Lab Tests:  I personally interpreted labs.  The pertinent results include:   WBC 2.6, hgb 12.3, plt 104 CMP no aki, no anion gap Valproic  acid 128 - discussed, no new dose adjustments, has been therapeutic in past, will recheck level in OP setting.  Lamictal  pending    Imaging Studies ordered:  I ordered imaging studies including CT head  I independently visualized and interpreted imaging which showed no acute findings.  I agree with the radiologist interpretation   Cardiac Monitoring: / EKG:  The patient was maintained on a cardiac monitor.  I personally viewed and interpreted the cardiac monitored which showed an underlying rhythm of: sinus    Problem List / ED Course / Critical interventions / Medication management  Patient reports with AMS, care taker reports this has been happening over the last month slowly. Does not this is suspected to be secondary to seizure and post-ictal state, no noted seizure activity reported today. Patient is mentation at baseline today with A&O to self. Patient moving extremities at baseline with left sided hemiplegia. Has been worked  out with recent CTA and MRI. These have not been remarkable, do not feel they need to be repeated today. No acute findings on head CT. No infectious symptoms. No UTI. NO sign of trauma on exam. No arrhythmia. Discussed labs and  follow up. Patients caregiver expresses understanding and agrees to plan.   I have reviewed the patients home medicines and have made adjustments as needed   Plan  F/u w/ PCP in 2-3d to ensure resolution of sx.  Patient was given return precautions. Patient stable for discharge at this time.  Patient educated on sx/dx and verbalized understanding of plan. Return to ER w/ new or worsening sx.       Final diagnoses:  Altered mental status, unspecified altered mental status type    ED Discharge Orders     None          Shermon Warren SAILOR, PA-C 06/30/24 0318    Ashely Goosby, Warren SAILOR, PA-C 06/30/24 [id number]    Charlyn Sora, MD 07/03/24 [phone]

## 2024-06-30 LAB — RAPID URINE DRUG SCREEN, HOSP PERFORMED
Amphetamines: NOT DETECTED
Barbiturates: NOT DETECTED
Benzodiazepines: NOT DETECTED
Cocaine: NOT DETECTED
Opiates: NOT DETECTED
Tetrahydrocannabinol: NOT DETECTED

## 2024-06-30 LAB — COMPREHENSIVE METABOLIC PANEL WITH GFR
ALT: 12 U/L (ref 0–44)
AST: 21 U/L (ref 15–41)
Albumin: 3.3 g/dL — ABNORMAL LOW (ref 3.5–5.0)
Alkaline Phosphatase: 62 U/L (ref 38–126)
Anion gap: 10 (ref 5–15)
BUN: 12 mg/dL (ref 8–23)
CO2: 29 mmol/L (ref 22–32)
Calcium: 9.5 mg/dL (ref 8.9–10.3)
Chloride: 99 mmol/L (ref 98–111)
Creatinine, Ser: 1.18 mg/dL (ref 0.61–1.24)
GFR, Estimated: >60 mL/min (ref >60)
Glucose, Bld: 149 mg/dL — ABNORMAL HIGH (ref 70–99)
Potassium: 3.3 mmol/L — ABNORMAL LOW (ref 3.5–5.1)
Sodium: 138 mmol/L (ref 135–145)
Total Bilirubin: 0.6 mg/dL (ref 0.0–1.2)
Total Protein: 7 g/dL (ref 6.5–8.1)

## 2024-06-30 LAB — URINALYSIS, ROUTINE W REFLEX MICROSCOPIC
Bilirubin Urine: NEGATIVE
Glucose, UA: NEGATIVE mg/dL
Hgb urine dipstick: NEGATIVE
Ketones, ur: NEGATIVE mg/dL
Leukocytes,Ua: NEGATIVE
Nitrite: NEGATIVE
Protein, ur: NEGATIVE mg/dL
Specific Gravity, Urine: 1.011 (ref 1.005–1.030)
pH: 6 (ref 5.0–8.0)

## 2024-06-30 LAB — CBC WITH DIFFERENTIAL/PLATELET
Basophils Relative: 0 K/uL (ref 0.0–0.1)
Eosinophils Absolute: 1 K/uL (ref 0.0–0.5)
Eosinophils Relative: 0.1 K/uL (ref 0.0–0.5)
HCT: 35.7 % — ABNORMAL LOW (ref 39.0–52.0)
Hemoglobin: 12.3 g/dL — ABNORMAL LOW (ref 13.0–17.0)
Lymphocytes Relative: 63 %
Lymphs Abs: 1.6 K/uL (ref 0.7–4.0)
MCH: 31.4 pg (ref 26.0–34.0)
MCHC: 34.5 g/dL (ref 30.0–36.0)
MCV: 91.1 fL (ref 80.0–100.0)
Monocytes Absolute: 2 K/uL (ref 0.1–1.0)
Monocytes Relative: 0.3 K/uL (ref 0.1–1.0)
Monocytes Relative: 10 K/uL (ref 0.7–4.0)
Neutro Abs: 0.6 K/uL — AB (ref 1.7–7.7)
Neutrophils Relative %: 24 %
Other: 0 K/uL (ref 0.00–0.07)
Platelets: 104 K/uL — ABNORMAL LOW (ref 150–400)
RBC: 3.92 MIL/uL — ABNORMAL LOW (ref 4.22–5.81)
RDW: 11.9 % (ref 11.5–15.5)
Smear Review: 0
WBC: 2.6 K/uL — ABNORMAL LOW (ref 4.0–10.5)
nRBC: 0 % (ref 0.0–0.2)

## 2024-06-30 LAB — CBG MONITORING, ED: Glucose-Capillary: 104 mg/dL — ABNORMAL HIGH (ref 70–99)

## 2024-06-30 LAB — VALPROIC ACID LEVEL: Valproic Acid Lvl: 128 ug/mL — ABNORMAL HIGH (ref 50–100)

## 2024-06-30 LAB — ETHANOL: Alcohol, Ethyl (B): <15 mg/dL (ref <15)

## 2024-06-30 LAB — MAGNESIUM: Magnesium: 2.3 mg/dL (ref 1.7–2.4)

## 2024-06-30 NOTE — Discharge Instructions (Addendum)
 Please follow-up with neurologist and have your Depakote  level rechecked, it is higher than normal. I would also recommend rechecking CBC to monitor white blood cell, red blood cell and platelets.  These are low today, if they are low on recheck you may need to see hematologist. Continue with current medications. Return to ER with new or worsening symptoms.

## 2024-07-01 ENCOUNTER — Encounter: Payer: Self-pay | Admitting: Neurology

## 2024-07-01 ENCOUNTER — Telehealth: Payer: Self-pay | Admitting: Neurology

## 2024-07-01 DIAGNOSIS — G40909 Epilepsy, unspecified, not intractable, without status epilepticus: Secondary | ICD-10-CM

## 2024-07-01 DIAGNOSIS — I69954 Hemiplegia and hemiparesis following unspecified cerebrovascular disease affecting left non-dominant side: Secondary | ICD-10-CM

## 2024-07-01 DIAGNOSIS — R4182 Altered mental status, unspecified: Secondary | ICD-10-CM

## 2024-07-01 DIAGNOSIS — F209 Schizophrenia, unspecified: Secondary | ICD-10-CM

## 2024-07-01 LAB — LAMOTRIGINE LEVEL: Lamotrigine Lvl: 12.6 ug/mL (ref 2.0–20.0)

## 2024-07-01 NOTE — Telephone Encounter (Signed)
 Referral  for PT/OT  was sent thru epic to [facility]

## 2024-07-01 NOTE — Telephone Encounter (Signed)
 Returned call to vanessa, no answer. Left message to return call and PT/OT was never ordered from our office.

## 2024-07-01 NOTE — Telephone Encounter (Signed)
 Shanda has returned call to April, RN

## 2024-07-01 NOTE — Addendum Note (Signed)
 Addended by: GAYLAND LAURAINE PARAS on: 07/01/2024 02:59 PM   Modules accepted: Orders

## 2024-07-01 NOTE — Telephone Encounter (Signed)
 Call to [name], gave 7/31 4pm. She is in agreement. Also reviewed ambulatory EEG with [name].She verbalized understanding

## 2024-07-01 NOTE — Telephone Encounter (Signed)
 Paperwork for [employer] placed on provider desk to sign

## 2024-07-01 NOTE — Telephone Encounter (Signed)
 Error

## 2024-07-01 NOTE — Telephone Encounter (Signed)
 [facility]  Phone [phone] Fax [phone]

## 2024-07-01 NOTE — Telephone Encounter (Signed)
 Call back and spoke to [place], LPN. Family is asking for PT and OT referral to re evaluate to see if therapy is needed as sarah last note stated to continue. Previously did therapy and goals met.   She states he was recently in hospital for altered mental status. She mentioned TIA, but the diagnosis was altered mental status. Reviewed office policy of new referral needed for new symptoms. Tabitha voiced understanding. She sated patient has declined, now being 2 person assist and not eating as well. He direct number is [phone], can leave voicemail.

## 2024-07-01 NOTE — Telephone Encounter (Signed)
 I called the patient's POA, [name]. Also staff, [place].   Staff feels like he needs to be seen sooner, since last ER visit, PCP wanted patient seen sooner. Overall, decline, requiring more assistance with ambulation, ADL. Increased weakness. Recent in the ER 06/29/24 episode of garbled speech, slow to respond. Per staff was advised concern for TIA, not as much seizures. CT head stable. Has not been wanting to eat. Would like to PT/OT, usually goes to outpatient facility.   Lamictal  level was fine 12.6, Depakote  128.   I am going to order ambulatory EEG 48 hours.  WBC 2.6, platelet 104, HGB 12.6. Will follow up with PCP about this.   Group Home # [facility] # [phone].  Can we get back in with Dr. Onita in next few weeks for evaluation?  Orders Placed This Encounter  Procedures   Ambulatory referral to Physical Therapy   Ambulatory referral to Occupational Therapy   AMBULATORY EEG

## 2024-07-01 NOTE — Telephone Encounter (Signed)
 Shanda from [facility] called to get new Referral sent out for Physical Therapy and  Occupational Therapy  .SABRA Shanda states they have not receive referrals.    Also Pt as of today has went to ER Pt  ha a seizure  and Shanda called to see if Pt can get earlier appt with MD

## 2024-07-02 ENCOUNTER — Other Ambulatory Visit: Payer: Self-pay

## 2024-07-02 ENCOUNTER — Emergency Department (HOSPITAL_COMMUNITY)

## 2024-07-02 ENCOUNTER — Telehealth: Payer: Self-pay

## 2024-07-02 ENCOUNTER — Encounter (HOSPITAL_COMMUNITY): Payer: Self-pay

## 2024-07-02 ENCOUNTER — Inpatient Hospital Stay (HOSPITAL_COMMUNITY)
Admission: EM | Admit: 2024-07-02 | Discharge: 2024-07-06 | DRG: 092 | Disposition: A | Discharge status: Skilled Nursing Facility | Source: Skilled Nursing Facility | Attending: Family Medicine | Admitting: Family Medicine

## 2024-07-02 DIAGNOSIS — E860 Dehydration: Secondary | ICD-10-CM | POA: Diagnosis present

## 2024-07-02 DIAGNOSIS — I959 Hypotension, unspecified: Secondary | ICD-10-CM | POA: Diagnosis present

## 2024-07-02 DIAGNOSIS — G40909 Epilepsy, unspecified, not intractable, without status epilepticus: Secondary | ICD-10-CM | POA: Diagnosis present

## 2024-07-02 DIAGNOSIS — K3184 Gastroparesis: Secondary | ICD-10-CM | POA: Diagnosis present

## 2024-07-02 DIAGNOSIS — Z7189 Other specified counseling: Secondary | ICD-10-CM | POA: Diagnosis not present

## 2024-07-02 DIAGNOSIS — R1310 Dysphagia, unspecified: Secondary | ICD-10-CM

## 2024-07-02 DIAGNOSIS — Z79899 Other long term (current) drug therapy: Secondary | ICD-10-CM

## 2024-07-02 DIAGNOSIS — K2270 Barrett's esophagus without dysplasia: Secondary | ICD-10-CM | POA: Diagnosis present

## 2024-07-02 DIAGNOSIS — I69354 Hemiplegia and hemiparesis following cerebral infarction affecting left non-dominant side: Secondary | ICD-10-CM

## 2024-07-02 DIAGNOSIS — T43595A Adverse effect of other antipsychotics and neuroleptics, initial encounter: Secondary | ICD-10-CM | POA: Diagnosis present

## 2024-07-02 DIAGNOSIS — E440 Moderate protein-calorie malnutrition: Secondary | ICD-10-CM | POA: Diagnosis present

## 2024-07-02 DIAGNOSIS — Z993 Dependence on wheelchair: Secondary | ICD-10-CM

## 2024-07-02 DIAGNOSIS — Z789 Other specified health status: Secondary | ICD-10-CM

## 2024-07-02 DIAGNOSIS — I69320 Aphasia following cerebral infarction: Secondary | ICD-10-CM

## 2024-07-02 DIAGNOSIS — Z8249 Family history of ischemic heart disease and other diseases of the circulatory system: Secondary | ICD-10-CM

## 2024-07-02 DIAGNOSIS — I10 Essential (primary) hypertension: Secondary | ICD-10-CM | POA: Diagnosis present

## 2024-07-02 DIAGNOSIS — G9389 Other specified disorders of brain: Secondary | ICD-10-CM | POA: Diagnosis present

## 2024-07-02 DIAGNOSIS — N179 Acute kidney failure, unspecified: Secondary | ICD-10-CM | POA: Diagnosis present

## 2024-07-02 DIAGNOSIS — K2100 Gastro-esophageal reflux disease with esophagitis, without bleeding: Secondary | ICD-10-CM | POA: Diagnosis present

## 2024-07-02 DIAGNOSIS — R6250 Unspecified lack of expected normal physiological development in childhood: Secondary | ICD-10-CM | POA: Diagnosis present

## 2024-07-02 DIAGNOSIS — Z515 Encounter for palliative care: Secondary | ICD-10-CM | POA: Diagnosis not present

## 2024-07-02 DIAGNOSIS — D509 Iron deficiency anemia, unspecified: Secondary | ICD-10-CM | POA: Diagnosis present

## 2024-07-02 DIAGNOSIS — G928 Other toxic encephalopathy: Secondary | ICD-10-CM | POA: Diagnosis present

## 2024-07-02 DIAGNOSIS — E039 Hypothyroidism, unspecified: Secondary | ICD-10-CM | POA: Diagnosis present

## 2024-07-02 DIAGNOSIS — Z803 Family history of malignant neoplasm of breast: Secondary | ICD-10-CM

## 2024-07-02 DIAGNOSIS — R4189 Other symptoms and signs involving cognitive functions and awareness: Secondary | ICD-10-CM | POA: Diagnosis present

## 2024-07-02 DIAGNOSIS — Z681 Body mass index (BMI) 19 or less, adult: Secondary | ICD-10-CM | POA: Diagnosis not present

## 2024-07-02 DIAGNOSIS — R410 Disorientation, unspecified: Secondary | ICD-10-CM | POA: Diagnosis not present

## 2024-07-02 DIAGNOSIS — K219 Gastro-esophageal reflux disease without esophagitis: Secondary | ICD-10-CM | POA: Diagnosis present

## 2024-07-02 DIAGNOSIS — G9341 Metabolic encephalopathy: Secondary | ICD-10-CM | POA: Diagnosis not present

## 2024-07-02 DIAGNOSIS — F79 Unspecified intellectual disabilities: Secondary | ICD-10-CM | POA: Diagnosis present

## 2024-07-02 DIAGNOSIS — Z79890 Hormone replacement therapy (postmenopausal): Secondary | ICD-10-CM

## 2024-07-02 DIAGNOSIS — F209 Schizophrenia, unspecified: Secondary | ICD-10-CM | POA: Diagnosis present

## 2024-07-02 DIAGNOSIS — E7800 Pure hypercholesterolemia, unspecified: Secondary | ICD-10-CM | POA: Diagnosis present

## 2024-07-02 DIAGNOSIS — G9340 Encephalopathy, unspecified: Secondary | ICD-10-CM | POA: Diagnosis present

## 2024-07-02 DIAGNOSIS — E1143 Type 2 diabetes mellitus with diabetic autonomic (poly)neuropathy: Secondary | ICD-10-CM | POA: Diagnosis present

## 2024-07-02 DIAGNOSIS — K5900 Constipation, unspecified: Secondary | ICD-10-CM | POA: Diagnosis present

## 2024-07-02 DIAGNOSIS — R634 Abnormal weight loss: Secondary | ICD-10-CM | POA: Diagnosis present

## 2024-07-02 DIAGNOSIS — R4182 Altered mental status, unspecified: Secondary | ICD-10-CM | POA: Diagnosis present

## 2024-07-02 DIAGNOSIS — R569 Unspecified convulsions: Secondary | ICD-10-CM

## 2024-07-02 DIAGNOSIS — Z7982 Long term (current) use of aspirin: Secondary | ICD-10-CM

## 2024-07-02 DIAGNOSIS — E46 Unspecified protein-calorie malnutrition: Secondary | ICD-10-CM | POA: Diagnosis present

## 2024-07-02 DIAGNOSIS — Z881 Allergy status to other antibiotic agents status: Secondary | ICD-10-CM

## 2024-07-02 HISTORY — DX: Polyp of stomach and duodenum: K31.7

## 2024-07-02 HISTORY — DX: Other gastritis with bleeding: K29.61

## 2024-07-02 HISTORY — DX: Unspecified intestinal obstruction, unspecified as to partial versus complete obstruction: K56.609

## 2024-07-02 HISTORY — DX: Gastrostomy status: Z93.1

## 2024-07-02 HISTORY — DX: Gastrointestinal hemorrhage, unspecified: K92.2

## 2024-07-02 HISTORY — DX: Unspecified intellectual disabilities: F79

## 2024-07-02 LAB — CBC
HCT: 36 % — ABNORMAL LOW (ref 39.0–52.0)
Hemoglobin: 12.5 g/dL — ABNORMAL LOW (ref 13.0–17.0)
MCH: 31.7 pg (ref 26.0–34.0)
MCHC: 34.7 g/dL (ref 30.0–36.0)
MCV: 91.4 fL (ref 80.0–100.0)
Platelets: 119 K/uL — ABNORMAL LOW (ref 150–400)
RBC: 3.94 MIL/uL — ABNORMAL LOW (ref 4.22–5.81)
RDW: 11.9 % (ref 11.5–15.5)
WBC: 5.5 K/uL (ref 4.0–10.5)
nRBC: 0 % (ref 0.0–0.2)

## 2024-07-02 LAB — URINALYSIS, ROUTINE W REFLEX MICROSCOPIC
Bacteria, UA: NONE SEEN
Bilirubin Urine: NEGATIVE
Glucose, UA: NEGATIVE mg/dL
Hgb urine dipstick: NEGATIVE
Ketones, ur: 5 mg/dL — AB
Leukocytes,Ua: NEGATIVE
Nitrite: NEGATIVE
Protein, ur: 100 mg/dL — AB
Specific Gravity, Urine: 1.03 (ref 1.005–1.030)
pH: 6 (ref 5.0–8.0)

## 2024-07-02 LAB — DIFFERENTIAL
Abs Immature Granulocytes: 0.02 K/uL (ref 0.00–0.07)
Basophils Absolute: 0.1 K/uL (ref 0.0–0.1)
Basophils Relative: 1 %
Eosinophils Absolute: 0.2 K/uL (ref 0.0–0.5)
Eosinophils Relative: 4 %
Immature Granulocytes: 0 %
Lymphocytes Relative: 25 %
Lymphs Abs: 1.4 K/uL (ref 0.7–4.0)
Monocytes Absolute: 0.5 K/uL (ref 0.1–1.0)
Monocytes Relative: 8 %
Neutro Abs: 3.4 K/uL (ref 1.7–7.7)
Neutrophils Relative %: 62 %

## 2024-07-02 LAB — COMPREHENSIVE METABOLIC PANEL WITH GFR
ALT: 10 U/L (ref 0–44)
AST: 21 U/L (ref 15–41)
Albumin: 3.5 g/dL (ref 3.5–5.0)
Alkaline Phosphatase: 62 U/L (ref 38–126)
Anion gap: 9 (ref 5–15)
BUN: 16 mg/dL (ref 8–23)
CO2: 29 mmol/L (ref 22–32)
Calcium: 9.4 mg/dL (ref 8.9–10.3)
Chloride: 100 mmol/L (ref 98–111)
Creatinine, Ser: 1.27 mg/dL — ABNORMAL HIGH (ref 0.61–1.24)
GFR, Estimated: >60 mL/min (ref >60)
Glucose, Bld: 103 mg/dL — ABNORMAL HIGH (ref 70–99)
Potassium: 3.5 mmol/L (ref 3.5–5.1)
Sodium: 138 mmol/L (ref 135–145)
Total Bilirubin: 0.7 mg/dL (ref 0.0–1.2)
Total Protein: 6.8 g/dL (ref 6.5–8.1)

## 2024-07-02 LAB — I-STAT CHEM 8, ED
BUN: 16 mg/dL (ref 8–23)
Calcium, Ion: 1.23 mmol/L (ref 1.15–1.40)
Chloride: 100 mmol/L (ref 98–111)
Creatinine, Ser: 1.3 mg/dL — ABNORMAL HIGH (ref 0.61–1.24)
Glucose, Bld: 93 mg/dL (ref 70–99)
HCT: 38 % — ABNORMAL LOW (ref 39.0–52.0)
Hemoglobin: 12.9 g/dL — ABNORMAL LOW (ref 13.0–17.0)
Potassium: 3.5 mmol/L (ref 3.5–5.1)
Sodium: 139 mmol/L (ref 135–145)
TCO2: 30 mmol/L (ref 22–32)

## 2024-07-02 LAB — VALPROIC ACID LEVEL
Valproic Acid Lvl: 133 ug/mL — ABNORMAL HIGH (ref 50–100)
Valproic Acid Lvl: 98 ug/mL (ref 50–100)

## 2024-07-02 LAB — ETHANOL: Alcohol, Ethyl (B): <15 mg/dL (ref <15)

## 2024-07-02 LAB — CBG MONITORING, ED: Glucose-Capillary: 107 mg/dL — ABNORMAL HIGH (ref 70–99)

## 2024-07-02 LAB — APTT: aPTT: 27 s (ref 24–36)

## 2024-07-02 LAB — PROTIME-INR
INR: 1.1 (ref 0.8–1.2)
Prothrombin Time: 14.4 s (ref 11.4–15.2)

## 2024-07-02 LAB — LACTIC ACID, PLASMA
Lactic Acid, Venous: 1.1 mmol/L (ref 0.5–1.9)
Lactic Acid, Venous: 1.1 mmol/L (ref 0.5–1.9)

## 2024-07-02 LAB — VITAMIN B12: Vitamin B-12: 1129 pg/mL — ABNORMAL HIGH (ref 180–914)

## 2024-07-02 LAB — AMMONIA: Ammonia: 13 umol/L (ref 9–35)

## 2024-07-02 MED ORDER — LEVOTHYROXINE SODIUM 75 MCG PO TABS
175.0000 ug | ORAL_TABLET | Freq: Every day | ORAL | Status: DC
  Administered 2024-07-03 – 2024-07-04 (×2): 175 ug via ORAL
  Filled 2024-07-02 (×2): qty 1

## 2024-07-02 MED ORDER — SODIUM CHLORIDE 0.9% FLUSH
3.0000 mL | Freq: Once | INTRAVENOUS | Status: AC
  Administered 2024-07-02: 3 mL via INTRAVENOUS

## 2024-07-02 MED ORDER — ENOXAPARIN SODIUM 40 MG/0.4ML IJ SOSY
40.0000 mg | PREFILLED_SYRINGE | INTRAMUSCULAR | Status: DC
  Administered 2024-07-03 – 2024-07-06 (×4): 40 mg via SUBCUTANEOUS
  Filled 2024-07-02 (×4): qty 0.4

## 2024-07-02 MED ORDER — ASPIRIN 81 MG PO TBEC
81.0000 mg | DELAYED_RELEASE_TABLET | Freq: Every day | ORAL | Status: DC
  Administered 2024-07-02 – 2024-07-05 (×3): 81 mg via ORAL
  Filled 2024-07-02 (×4): qty 1

## 2024-07-02 MED ORDER — POLYETHYLENE GLYCOL 3350 17 G PO PACK
17.0000 g | PACK | Freq: Two times a day (BID) | ORAL | Status: DC
  Administered 2024-07-02 – 2024-07-06 (×6): 17 g via ORAL
  Filled 2024-07-02 (×5): qty 1

## 2024-07-02 MED ORDER — LAMOTRIGINE 100 MG PO TABS
100.0000 mg | ORAL_TABLET | Freq: Every day | ORAL | Status: DC
  Administered 2024-07-02 – 2024-07-05 (×3): 100 mg via ORAL
  Filled 2024-07-02 (×4): qty 1

## 2024-07-02 MED ORDER — LACTATED RINGERS IV SOLN: INTRAVENOUS | Status: AC

## 2024-07-02 MED ORDER — SENNOSIDES-DOCUSATE SODIUM 8.6-50 MG PO TABS
1.0000 | ORAL_TABLET | Freq: Every evening | ORAL | Status: DC | PRN
  Administered 2024-07-03: 1 via ORAL
  Filled 2024-07-02 (×2): qty 1

## 2024-07-02 MED ORDER — DIVALPROEX SODIUM 250 MG PO DR TAB
500.0000 mg | DELAYED_RELEASE_TABLET | Freq: Two times a day (BID) | ORAL | Status: DC
  Administered 2024-07-02 – 2024-07-06 (×7): 500 mg via ORAL
  Filled 2024-07-02 (×8): qty 2

## 2024-07-02 MED ORDER — ROSUVASTATIN CALCIUM 20 MG PO TABS
20.0000 mg | ORAL_TABLET | Freq: Every day | ORAL | Status: DC
  Administered 2024-07-02 – 2024-07-06 (×5): 20 mg via ORAL
  Filled 2024-07-02 (×6): qty 1

## 2024-07-02 MED ORDER — QUETIAPINE FUMARATE 100 MG PO TABS
100.0000 mg | ORAL_TABLET | Freq: Once | ORAL | Status: AC
  Administered 2024-07-03: 100 mg via ORAL
  Filled 2024-07-02: qty 1

## 2024-07-02 MED ORDER — PANTOPRAZOLE SODIUM 40 MG PO TBEC
40.0000 mg | DELAYED_RELEASE_TABLET | Freq: Two times a day (BID) | ORAL | Status: DC
  Administered 2024-07-03 – 2024-07-06 (×7): 40 mg via ORAL
  Filled 2024-07-02 (×8): qty 1

## 2024-07-02 MED ORDER — QUETIAPINE FUMARATE 100 MG PO TABS
100.0000 mg | ORAL_TABLET | Freq: Two times a day (BID) | ORAL | Status: DC
  Administered 2024-07-02: 100 mg via ORAL
  Filled 2024-07-02: qty 1

## 2024-07-02 MED ORDER — THIAMINE HCL 100 MG/ML IJ SOLN
500.0000 mg | Freq: Every day | INTRAVENOUS | Status: AC
  Administered 2024-07-02 – 2024-07-06 (×5): 500 mg via INTRAVENOUS
  Filled 2024-07-02 (×5): qty 5

## 2024-07-02 MED ORDER — LAMOTRIGINE 25 MG PO TABS
25.0000 mg | ORAL_TABLET | Freq: Every day | ORAL | Status: DC
  Administered 2024-07-02 – 2024-07-05 (×3): 25 mg via ORAL
  Filled 2024-07-02 (×4): qty 1

## 2024-07-02 MED ORDER — DIVALPROEX SODIUM 250 MG PO DR TAB: 250.0000 mg | DELAYED_RELEASE_TABLET | Freq: Three times a day (TID) | ORAL | Status: DC

## 2024-07-02 MED ORDER — QUETIAPINE FUMARATE 200 MG PO TABS
200.0000 mg | ORAL_TABLET | Freq: Two times a day (BID) | ORAL | Status: DC
  Administered 2024-07-03 – 2024-07-06 (×6): 200 mg via ORAL
  Filled 2024-07-02 (×7): qty 1

## 2024-07-02 NOTE — ED Notes (Signed)
 Caregiver at bedside states pt hasn't been himself in two weeks. Each time pt is evaluated nothing is found. Pt usually speaks loud and clearly. He assist with some care.

## 2024-07-02 NOTE — H&P (Signed)
 Hospital Admission History and Physical Service Pager: [phone]  Patient name: [name] Medical record number: [id number] Date of Birth: November 12, 1956 Age: 68 y.o. Gender: male  Primary Care Provider: Hilma Philis HERO Consultants: Neurology Code Status: FULL, which was confirmed with [name] via phone Preferred Emergency Contact: Sister [name] [place] Information     Name Relation Home Work [place] [phone] Sister [phone]     Blount,Brittany Other   [phone]   Lorence Dene Silk   [phone]   home,group Other [phone]        Other Contacts   None on File    Chief Complaint: Altered mental status  Assessment and Plan: [name] is a 68 y.o. male presenting with encephalopathy.  Differential for this patient's presentation includes dehydration, nutritional deficiency, and polypharmacy/medication overdosing.  Stroke ruled out by CT and MRI imaging.  Given patient's progressive weight loss of ~13 kg over the past year, it is reasonable to expect that medications are dosed too high for new weight.  Possible nutritional deficiencies in setting of poor PO intake and likewise possible dehydration (note soft BP).  It is unclear if patient's underlying functional decline and anorexia over the course of the past year are due to natural progression of his multiple neurological conditions or if there is an underlying reversible etiology.  Patient may have new onset dementia given his extensive neurological comorbidities.  Will follow workup for reversible causes. Assessment & Plan Encephalopathy - Admit to FMTS inpatient, Progressive unit, attending Dr. McDiarmid - Neuro checks Q4h - Seizure precautions - PT/OT eval and treat - Consult to palliative care per discussion with sister - AM BMP - Appreciate neurology recommendations: - Suspect oversedation - Check ammonia, B12, thiamine  - Consider decreasing quetiapine  -  Repeat Depakote  1900, reduced to 250 mg TID, but can return to home 500 mg BID dosing if WNL - Continue lamotrigine  125 mg nightly - Workup for other etiologies of hypotension - Increased Depakote  back to home 500 mg BID, as repeat level was WNL - Decreased quetiapine  to 200 mg BID from 300 mg BID Malnutrition (HCC) Dysphagia Chronic and progressive.  No clear etiology for decreased oral intake.  Notably, has history of dysphagia.  Also with history of severe GERD and Barrett esophagus, which would increase risk for esophageal cancer.  Last colonoscopy 2019 without concern for malignancy. - Dysphagia 3 diet for now - 3/4 mIVF LR, reevaluate PO intake in AM - SLP evaluation for AM - RD consult - Consider GI consult if continually poor PO intake, consider endoscopy Chronic health problem HLD: Continue rosuvastatin  20 mg GERD: Continue pantoprazole  40 mg BID Hypothyroidism: Continue levothyroxine  175 mcg, recheck TSH Schizophrenia: Quetiapine  as above Constipation: MiraLAX  BID and senna daily T2DM: A1c consistently <6 on checks, no recent values, will obtain updated A1c  Hold benazepril 5 mg, metoprolol  12.5 mg BID Reduce quetiapine  from 300 mg BID to 100 mg BID for now, monitor closely  FEN/GI: Dysphagia 3 diet VTE Prophylaxis: Lovenox    Disposition: Med-tele  History of Present Illness: [name], DSS at bedside and called sister [name] [facility]) in room for collateral. [name] is a 68 y.o. male with a pertinent PMH of T2DM, dysphagia, HTN, HLD, developmental delay, schizophrenia, brain cyst s/p craniotomy, and seizure disorder on AEDs presenting with encephalopathy.  In the ED, MRI and CT head with extensive right frontal encephalomalacia, but no evidence of stroke.  Neurology consulted and recommended workup  for nutritional deficiencies and titrating AEDs/quetiapine .  FMTS consulted for admission for further encephalopathy workup.  Yesterday  night, patient was at baseline.  This morning, patient was noted to have increased weakness after breakfast around 8:30 AM.  He was slumped over in his chair and would not sit up.  Was not following instructions well.  Weakness was bilateral.  Got his medications around 7 AM.  No shaking movements observed.  Appears to be waxing and waning.  At baseline, patient walks to the bathroom by himself, feeds himself and is able to hold a cup to drink.  He is able to cooperate with dressing with help and prompting.  Should be alert to self, place, situation, not time.  Has not been able to do so since his ED visit 6/2.  Has generally been progressively deteriorating in mental status and level of function for the past year.  Of note, both sister and DSS worker note that patient has been losing weight progressively, has been poorly overall, in particular over the past week or so.  Was around 150 lbs in 2022.  66 kg lbs in June 2024.  Now 53 kg in ED.  Was seen in ED on 7/7 for altered mental status, reportedly similar presentation to this admission.  Discharged at baseline with no acute diagnosis made.  On FMTS evaluation, patient is currently improved from this morning per DSS worker at bedside, but mental status is still not at recent baseline because he is not making sense fully.  Review Of Systems: Per HPI.   Pertinent Past Medical History: - HTN - Schizophrenia - HLD - Schizophrenia - Barrett esophagus - Dysphagia - IDA - Seizure disorder  Remainder reviewed in history tab.   Pertinent Past Surgical History: - Craniotomy early 2000s  Remainder reviewed in history tab.   Pertinent Social History: Tobacco use: None Alcohol use: None Other Substance use: None Lives in group home with 4 other residents, all with developmental challenges. Normally requires assistance with some ADLs and all IADLs, though has been deteriorating somewhat the past few weeks. Sister is traveling for work from  tomorrow until Wednesday 7/16, but is available by phone.   Pertinent Family History: - No Hx seizures - Breast cancer in mother - MI in father - [place] with craniotomies for similar conditions  Remainder reviewed in history tab.    Important Outpatient Medications:  Current Outpatient Medications  Medication Instructions   aspirin  EC 81 mg, Oral, Daily, Swallow whole.   benazepril (LOTENSIN) 5 mg, Oral, Every evening   bisacodyl  (DULCOLAX) 10 mg, Rectal, Daily PRN   diphenhydrAMINE  (BENADRYL ) 25 mg, Oral, Every 6 hours PRN   divalproex  (DEPAKOTE ) 500 mg, Oral, Every 12 hours   docusate sodium  (COLACE) 100 mg, Oral, 2 times daily   hydrocortisone  cream 1 % 1 Application, Topical, Daily PRN   lamoTRIgine  (LAMICTAL ) 125 mg, Oral, Daily at bedtime   levothyroxine  (SYNTHROID ) 175 mcg, Oral, Daily before breakfast   Magnesium Hydroxide (MILK OF MAGNESIA PO) 30 mLs, Oral, Daily PRN   metoprolol  tartrate (LOPRESSOR ) 12.5 mg, Oral, 2 times daily   ondansetron  (ZOFRAN ) 4 mg, Oral, Every 8 hours PRN   pantoprazole  (PROTONIX ) 40 mg, Oral, 2 times daily before meals   polyethylene glycol (MIRALAX  / GLYCOLAX ) 17 g, Oral, 2 times daily   Corn Husker lotion 1 Application, 2 times daily   promethazine (PHENERGAN) 25 mg, Rectal, Every 6 hours PRN   QUEtiapine  (SEROQUEL ) 300 mg, Oral, 2 times daily  rosuvastatin  (CRESTOR ) 20 mg, Oral, Daily    Remainder reviewed in medication history.    Objective: BP 125/85 (BP Location: Left Arm)   Pulse 76   Temp 98 F (36.7 C) (Oral)   Resp 18   Wt 53 kg   SpO2 98%   BMI 17.77 kg/m   Physical Exam: General: Thin, deconditioned.  Chronically ill-appearing.  Alert. Eyes: No scleral icterus. ENTM: Mildly dry mucous membranes. Neck: Supple. Cardiovascular: Normal S1/S2.  RRR.  No m/r/g. Respiratory: CTAB.  Normal WOB on room air. Gastrointestinal: Soft, nondistended.  Nontender to palpation.  Hypoactive bowel sounds. Extremities: No  peripheral edema bilaterally.  Capillary refill 2 seconds. Derm: No rashes grossly.  Neurological examination: MS:  Awake, alert, tracking in room and following some simple instructions. Speech moderately aphasic, poor comprehension and some non sequitur responses.  Attention and concentration diminished.  Alert to self and place but not time or situation. Cranial nerves: CN II:  PERRLA.  Blinks normally to threat bilaterally. CNs III, IV, VI:  Full extraocular eye movement without nystagmus.  No ptosis or diplopia. CN V:  Facial sensation is normal, no weakness of masticatory muscles.  CN VII:  No facial weakness or asymmetry.  CN VIII:  Auditory acuity grossly normal. CNs XI/X:  Patient refused to open mouth for exam. CN XI:  Normal sternocleidomastoid and trapezius strength. CN XII: Patient refused to open mouth for exam. MOTOR:  Strength 4/5 RUE, 2/5 LUE, 4/5 RLE, 2/5 LLE. COORDINATION:  Unable to evaluate. SENSATION:  Intact to light touch over all four extremities.  Labs:  CBC BMET  Recent Labs  Lab 07/02/24 1310 07/02/24 1321  WBC 5.5  --   HGB 12.5* 12.9*  HCT 36.0* 38.0*  PLT 119*  --    Recent Labs  Lab 07/02/24 1310 07/02/24 1321  NA 138 139  K 3.5 3.5  CL 100 100  CO2 29  --   BUN 16 16  CREATININE 1.27* 1.30*  GLUCOSE 103* 93  CALCIUM  9.4  --      Pertinent additional labs: -PT/INR WNL -Lactate 1.1 -BCx pending -UA with protein, small ketones and no evidence if infection -Ethanol WNL -No acidosis on I-stat  EKG: Baseline wander and mild artifact.  NSR.  Imaging Studies: -CXR: Low long volumes, no acute processes -CT head: Stable encephalomalacia in right frontal lobe. -MR brain: Extensive right frontal encephalomalacia and surrounding gliosis.  Raza Bayless, MD 07/02/2024, 11:10 PM PGY-2, [facility]  FPTS Intern pager: [phone], text pages welcome Secure chat group [facility] Teaching Service

## 2024-07-02 NOTE — Consult Note (Signed)
 NEUROLOGY CONSULT NOTE   Date of service: July 02, 2024 Patient Name: [name] MRN:  [id number] DOB:  [birth year] Chief Complaint: left sided weakness Requesting Provider: Ula Prentice SAUNDERS, MD  History of Present Illness  [name] is a 68 y.o. 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  Of note he was very hypotensive on initial EMS arrival 60/40 improving to 106/62 with Trendelenburg positioning, unable to administer IV fluids due to difficult IV access  He did present with similar symptoms to the ED 7/7 as well as 6/2 and 5/22   Per last neurology visit 6/4 In the ER 5/22-5/23 for left-sided weakness and altered mental status (general weakness unable to push himself in wheelchair or transfer to the toilet, little harder to understand).  EEG showed diffuse encephalopathy.  MRI of the brain was negative.  Increased Lamictal  125 mg at bedtime, added aspirin  81 mg daily.  Lamictal  level 10.3, Depakote  88.  Back to the ER 05/25/2024, he was slow to respond with more garbled speech, resolved after about 30 minutes.  His Lamictal  dose had yet to be increased.  Called his group home, his Lamictal  has yet to increased to 125 mg at bedtime, still on 100 mg, gets pill packs. His speech is still garbled, still generally weak, needs 2 person assist, weakness has been ongoing since last June with hospitalization. Before, right arm was strong enough to pull himself. He was refusing PT, has been doing several rounds. Patient's sister, [name] is  here today.  LKW: Several weeks per family Modified rankin score: 4-Needs assistance to walk and tend to bodily needs IV Thrombolysis: No, EVT: No, out of the window and exam not consistent with new LVO     ROS  Unable to assess secondary to patient's mental status   Past History   Past Medical History:  Diagnosis Date   Barrett esophagus    DM II (diabetes mellitus, type II), controlled (HCC) 11/13/2020   Dysphagia    Gastroparesis    GERD (gastroesophageal reflux disease)    Hypercholesteremia    Hypertension    Iron deficiency anemia    Mental retardation    Schizophrenia (HCC)    Seizure (HCC)    Stroke [facility])     Past Surgical History:  Procedure Laterality Date   BIOPSY  10/21/2020   Procedure: BIOPSY;  Surgeon: Avram Lupita BRAVO, MD;  Location: [facility] ENDOSCOPY;  Service: Endoscopy;;   CRANIOTOMY FOR CYST FENESTRATION     ESOPHAGOGASTRODUODENOSCOPY N/A 01/09/2015   Procedure: ESOPHAGOGASTRODUODENOSCOPY (EGD);  Surgeon: Lamar JONETTA Aho, MD;  Location: [facility] ENDOSCOPY;  Service: Endoscopy;  Laterality: N/A;   ESOPHAGOGASTRODUODENOSCOPY N/A 10/26/2020   Procedure: ESOPHAGOGASTRODUODENOSCOPY (EGD);  Surgeon: Shyrl Linnie KIDD, MD;  Location: [facility] OR;  Service: Thoracic;  Laterality: N/A;   ESOPHAGOGASTRODUODENOSCOPY (EGD) WITH PROPOFOL  N/A 10/21/2020   Procedure: ESOPHAGOGASTRODUODENOSCOPY (EGD) WITH PROPOFOL ;  Surgeon: Avram Lupita BRAVO, MD;  Location: [facility] ENDOSCOPY;  Service: Endoscopy;  Laterality: N/A;   IR GASTR TUBE CONVERT GASTR-JEJ PER W/FL MOD SED  11/07/2020   PEG PLACEMENT N/A 10/26/2020   Procedure:  PERCUTANEOUS ENDOSCOPIC GASTROSTOMY (PEG) PLACEMENT;  Surgeon: Shyrl Linnie KIDD, MD;  Location: [facility] OR;  Service: Thoracic;  Laterality: N/A;   XI ROBOTIC ASSISTED HIATAL HERNIA REPAIR N/A 10/26/2020   Procedure: XI ROBOTIC ASSISTED HIATAL HERNIA REPAIR;  Surgeon: Shyrl Linnie KIDD, MD;  Location: [facility] OR;  Service: Thoracic;  Laterality: N/A;    Family History: Family  History  Problem Relation Age of Onset   Breast cancer Mother    Heart disease Father     Social History  reports that he has never smoked. He has never used smokeless tobacco. He reports that he does not currently use alcohol. He reports that he does not use drugs.  Allergies  Allergen Reactions   Vancomycin Itching and Other (See Comments)    Unknown-possible hives      Medications   Current Facility-Administered Medications:    sodium chloride  flush (NS) 0.9 % injection 3 mL, 3 mL, Intravenous, Once, Ula Prentice SAUNDERS, MD  Current Outpatient Medications:    acetaminophen  (TYLENOL ) 325 MG tablet, Take 650 mg by mouth every 4 (four) hours as needed., Disp: , Rfl:    aspirin  EC 81 MG tablet, Take 1 tablet (81 mg total) by mouth daily. Swallow whole., Disp: 30 tablet, Rfl: 11   benazepril (LOTENSIN) 5 MG tablet, Take 5 mg by mouth daily., Disp: , Rfl:    bisacodyl  (DULCOLAX) 10 MG suppository, Place 10 mg rectally as needed for moderate constipation., Disp: , Rfl:    diphenhydrAMINE  (BENADRYL ) 25 mg capsule, Take 25 mg by mouth every 6 (six) hours as needed for itching, allergies or sleep., Disp: , Rfl:    divalproex  (DEPAKOTE ) 500 MG DR tablet, Take 1 tablet (500 mg total) by mouth every 12 (twelve) hours., Disp: 60 tablet, Rfl: 0   docusate sodium  (COLACE) 100 MG capsule, Take 1 capsule (100 mg total) by mouth 2 (two) times daily., Disp: 10 capsule, Rfl: 0   Emollient (CORN HUSKERS EX), Apply 1 application topically in the morning and at bedtime. For dry skin, Disp: , Rfl:    Ensure (ENSURE), Take 237 mLs by mouth., Disp: , Rfl:    guaiFENesin  (ROBITUSSIN) 100 MG/5ML liquid, Take 5 mLs by mouth every 4 (four) hours as needed for cough or to loosen phlegm., Disp: , Rfl:    lamoTRIgine  (LAMICTAL ) 25 MG tablet, Take 5 tablets (125 mg total) by mouth at bedtime., Disp: 150 tablet, Rfl: 11   levothyroxine  (SYNTHROID ) 175 MCG tablet, Take 175 mcg by mouth daily before breakfast., Disp: ,  Rfl:    loperamide (IMODIUM) 2 MG capsule, Take 2 mg by mouth as needed for diarrhea or loose stools., Disp: , Rfl:    Magnesium Hydroxide (MILK OF MAGNESIA PO), Take 30 mLs by mouth daily as needed., Disp: , Rfl:    metoprolol  tartrate (LOPRESSOR ) 25 MG tablet, Take 0.5 tablets (12.5 mg total) by mouth 2 (two) times daily., Disp: 30 tablet, Rfl: 0   mineral oil enema, Place 1 enema rectally daily as needed for severe constipation., Disp: , Rfl:    neomycin-bacitracin -polymyxin (NEOSPORIN) [phone] ointment, Apply 1 Application topically in the morning and at bedtime., Disp: , Rfl:    ondansetron  (ZOFRAN ) 4 MG tablet, Take 4 mg by mouth every 8 (eight) hours as needed for nausea or vomiting., Disp: , Rfl:    pantoprazole  (PROTONIX ) 40 MG tablet, Take 1 tablet (40 mg total) by mouth 2 (two) times daily before a meal., Disp: 60 tablet, Rfl: 0   polyethylene glycol (  MIRALAX  / GLYCOLAX ) 17 g packet, Take 17 g by mouth 2 (two) times daily., Disp: 14 each, Rfl: 0   promethazine (PHENERGAN) 25 MG suppository, Place 25 mg rectally every 6 (six) hours as needed for nausea or vomiting., Disp: , Rfl:    QUEtiapine  (SEROQUEL ) 300 MG tablet, Take 300 mg by mouth 2 (two) times daily., Disp: , Rfl:    rosuvastatin  (CRESTOR ) 20 MG tablet, Take 1 tablet (20 mg total) by mouth daily., Disp: 30 tablet, Rfl: 0  Vitals   Vitals:   07/02/24 1100 07/02/24 1155 07/02/24 1200  BP:  113/64 114/87  Pulse:  79 79  SpO2:  96% 94%  Weight: 53 kg      Body mass index is 17.77 kg/m.   Physical Exam   Constitutional: Appears chronically ill, frail Psych: Affect pleasant Eyes: No scleral injection.  HENT: No OP obstruction. Temporal muscle wasting Cardiovascular: Normal rate and regular rhythm.  Respiratory: Effort normal, non-labored breathing.  GI: Soft.  No distension. There is no tenderness.  Extremities: Left foot drop orthotic device in place   Mental Status: Initially awake but during transfer to MRI  became quite sleepy.  Unable to correctly state month or age.  Follows simple commands sometimes with encouragement/repetition.  Speech is somewhat difficult to understand due to dysarthria but able to name simple objects and repeat simple but not complex phrases.  Also limited by poor attention/concentration Cranial Nerves: II: Visual Fields are full. Pupils are equal, round, and reactive to light.   III,IV, VI: Mild bilateral ptosis.  Initially slight right gaze preference but this resolved V: Facial sensation is symmetric to touch VII: Facial movement with initial slight left facial droop but this resolved VIII: hearing is intact to voice X: Uvula elevates symmetrically XI: Shoulder shrug is symmetric. XII: tongue is midline without atrophy or fasciculations.  Motor: Left spastic hemiparesis.  Initially 1-2/5 on the left arm and leg but later able to lift arm and leg antigravity Good antigravity strength on the right Sensory: Reports sensation intact in all 4 without extinction Cerebellar: Finger-to-nose intact with the limits of weakness Gait:  Deferred, mostly wheelchair-bound at baseline   Labs/Imaging/Neurodiagnostic studies   CBC:  Recent Labs  Lab 07-12-2024 2332  WBC 2.6*  NEUTROABS 0.6*  HGB 12.3*  HCT 35.7*  MCV 91.1  PLT 104*   Basic Metabolic Panel:  Lab Results  Component Value Date   NA 138 07/12/2024   K 3.3 (L) 12-Jul-2024   CO2 29 2024-07-12   GLUCOSE 149 (H) 07/12/2024   BUN 12 07/12/2024   CREATININE 1.18 07/12/2024   CALCIUM  9.5 2024/07/12   GFRNONAA >60 12-Jul-2024   GFRAA >60 09/07/2020   Lipid Panel:  Lab Results  Component Value Date   LDLCALC 38 04/10/2023   HgbA1c:  Lab Results  Component Value Date   HGBA1C 5.6 04/09/2023   Urine Drug Screen:     Component Value Date/Time   LABOPIA NONE DETECTED 06/30/2024 0202   COCAINSCRNUR NONE DETECTED 06/30/2024 0202   LABBENZ NONE DETECTED 06/30/2024 0202   AMPHETMU NONE DETECTED 06/30/2024  0202   THCU NONE DETECTED 06/30/2024 0202   LABBARB NONE DETECTED 06/30/2024 0202    Alcohol Level     Component Value Date/Time   [facility] <15 2024/07/12 2332   INR  Lab Results  Component Value Date   INR 1.1 05/14/2024   APTT  Lab Results  Component Value Date   APTT 28 05/14/2024   AED levels:  Lab Results  Component Value Date   LAMOTRIGINE  12.6 06/29/2024   06/23/2024 biopsy: A.  GASTROESOPHAGEAL JUNCTION, BIOPSY:               Squamous and columnar mucosa with chronic inflammation and reactive epithelial changes.               No intraepithelial eosinophils identified.               No intestinal metaplasia, dysplasia, or malignancy identified.   CT Head without contrast(Personally reviewed): 1. Stable chronic encephalomalacia changes within the right frontal lobe. No apparent acute process. 2. ASPECTS is 10.  MRI Brain(Personally reviewed): 1. Limited study demonstrating no evidence of acute or recent infarction. There is extensive right frontal encephalomalacia and surrounding gliosis.   ASSESSMENT   [name] is a 68 y.o. male with frequent presentations for worsening left-sided weakness.  Today EMS clearly reported hypotension and his exam seem to improve as his blood pressure improved.   Of note his lamotrigine  was recently increased and his Depakote  which has normally been within the typical therapeutic range is elevated.  Last prior Depakote  level was checked at approximately 11 PM and therefore may have reflected administration at 8 PM and been artificially high.  However today as a level was checked midday, does not represent a true trough but is on the higher side.  Will check that he is not having adverse effects of Depakote  by checking ammonia and repeating a trough level to confirm within adequate range.  He is on quite a number of sedating medications, and overall his examination seems more consistent with symptomatic hypotension (with worsening  of his left-sided weakness due to recrudescence in the setting of hypotension) rather than seizure or stroke.  He also may be oversedated  Also of note he seems to have had a substantial weight loss, weight documented on office visit 06/26/2023 was 65.8 kg and today is only 53 kg.  This may be contributing to him being oversedated due to volume of distribution for his medications  It does seem that his Seroquel  was also increased back in October 2024.  Per neurology note from 06/26/2023 Seroquel  was decreased from 400 to 200mg  bid since hospital discharge on June 13 [2024], remained on Depakote  DR 500 mg twice a day lamotrigine  100 mg once a day... He is overall doing better, more alert with the lower dose of Seroquel , no confusion /passing out spell --his Seroquel  dose may need to be adjusted, as it seems he is currently either on 300 twice daily or 400 twice daily   RECOMMENDATIONS  - Ammonia, B12, thiamine  - consider reducing seroquel , and if this again improves his symptoms I would strongly consider not increasing it again in the future - Repeat depakote  level at 1900 to check true trough, tentatively reduce depakote  to 250 mg TID if remains elevated, but if it is within normal range would continue at 500 twice daily - Continue lamotrigine  125 mg nightly - workup for other etiologies of hypotension per primary team / ED  - Workup of weight loss per primary team ______________________________________________________________________  Signed, Lola LITTIE Jernigan, MD [facility]  CRITICAL CARE Performed by: Lola LITTIE Jernigan   Total critical care time: 60 minutes  Critical care time was exclusive of separately billable procedures and treating other patients.  Critical care was necessary to treat or prevent imminent or life-threatening deterioration.  Critical care was time spent personally by me on the following activities:  development of treatment plan with patient and/or surrogate  as well as nursing, discussions with consultants, evaluation of patient's response to treatment, examination of patient, obtaining history from patient or surrogate, ordering and performing treatments and interventions, ordering and review of laboratory studies, ordering and review of radiographic studies, pulse oximetry and re-evaluation of patient's condition.

## 2024-07-02 NOTE — ED Notes (Signed)
 Code Stroke cancelled at 1230 per Dr Jerrie

## 2024-07-02 NOTE — Telephone Encounter (Signed)
 Shanda from [facility] wants Lauraine, NP informed that on Monday and today pt has been sent back to ED for stroke like symptoms her call back # is [phone]

## 2024-07-02 NOTE — ED Provider Notes (Signed)
  EMERGENCY DEPARTMENT AT [facility] Provider Note   CSN: [id number] Arrival date & time: 07/02/24  1146  An emergency department physician performed an initial assessment on this suspected stroke patient at 1150.  Patient presents with: Code Stroke   [name] is a 68 y.o. male.   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  His last known normal was apparently at 8:30 AM this morning.  The patient is unable to provide much history.  He was noted to be hypotensive at his nursing facility.  He was sent in today and was made a code stroke due to concern for possible new neurologic symptoms.  Looking back through his chart it appears he was seen on 06/29/2024 for very similar symptoms.  The patient's blood pressure improved when he was placed in the supine position to greater than 100 systolic.    Prior to Admission medications   Medication Sig Start Date End Date Taking? Authorizing Provider  acetaminophen  (TYLENOL ) 325 MG tablet Take 650 mg by mouth every 4 (four) hours as needed.    [provider]  aspirin  EC 81 MG tablet Take 1 tablet (81 mg total) by mouth daily. Swallow whole. 05/15/24 05/15/25  Lafe Domino, DO  benazepril (LOTENSIN) 5 MG tablet Take 5 mg by mouth daily. 06/05/23   [provider]  bisacodyl  (DULCOLAX) 10 MG suppository Place 10 mg rectally as needed for moderate constipation.    [provider]  diphenhydrAMINE  (BENADRYL ) 25 mg capsule Take 25 mg by mouth every 6 (six) hours as needed for itching, allergies or sleep.    [provider]  divalproex  (DEPAKOTE ) 500 MG DR tablet Take 1 tablet (500 mg total) by mouth every 12 (twelve) hours. 06/06/23 05/27/25  Cindy Garnette POUR, MD   docusate sodium  (COLACE) 100 MG capsule Take 1 capsule (100 mg total) by mouth 2 (two) times daily. 12/06/20   Angiulli, Toribio PARAS, PA-C  Emollient (CORN HUSKERS EX) Apply 1 application topically in the morning and at bedtime. For dry skin    [provider]  Ensure (ENSURE) Take 237 mLs by mouth.    [provider]  guaiFENesin  (ROBITUSSIN) 100 MG/5ML liquid Take 5 mLs by mouth every 4 (four) hours as needed for cough or to loosen phlegm.    [provider]  lamoTRIgine  (LAMICTAL ) 25 MG tablet Take 5 tablets (125 mg total) by mouth at bedtime. 05/27/24 05/22/25  Gayland Domino PARAS, NP  levothyroxine  (SYNTHROID ) 175 MCG tablet Take 175 mcg by mouth daily before breakfast.    [provider]  loperamide (IMODIUM) 2 MG capsule Take 2 mg by mouth as needed for diarrhea or loose stools.    [provider]  Magnesium Hydroxide (MILK OF MAGNESIA PO) Take 30 mLs by mouth daily as needed.    [provider]  metoprolol  tartrate (LOPRESSOR ) 25 MG tablet Take 0.5 tablets (12.5 mg total) by mouth 2 (two) times daily. 06/06/23 05/27/25  Cindy Garnette POUR, MD  mineral oil enema Place 1 enema rectally daily as needed for severe constipation.    [provider]  neomycin-bacitracin -polymyxin (NEOSPORIN) [phone] ointment Apply 1 Application topically in the morning and at bedtime.    [provider]  ondansetron  (ZOFRAN ) 4 MG tablet Take 4 mg by mouth every 8 (eight) hours  as needed for nausea or vomiting.    [provider]  pantoprazole  (PROTONIX ) 40 MG tablet Take 1 tablet (40 mg total) by mouth 2 (two) times daily before a meal. 12/06/20   Angiulli, Toribio PARAS, PA-C  polyethylene glycol (MIRALAX  / GLYCOLAX ) 17 g packet Take 17 g by mouth 2 (two) times daily. 12/06/20   Angiulli, Toribio PARAS, PA-C  promethazine (PHENERGAN) 25 MG suppository Place 25 mg rectally every 6 (six) hours as needed for nausea or vomiting.    [provider]   QUEtiapine  (SEROQUEL ) 300 MG tablet Take 300 mg by mouth 2 (two) times daily. 05/06/24   [provider]  rosuvastatin  (CRESTOR ) 20 MG tablet Take 1 tablet (20 mg total) by mouth daily. 12/07/20   Angiulli, Toribio PARAS, PA-C    Allergies: Vancomycin    Review of Systems  Reason unable to perform ROS: Dysarthria/mental status.    Updated Vital Signs BP 104/80   Pulse 75   Temp (!) 96.5 F (35.8 C) (Tympanic)   Wt 53 kg   SpO2 95%   BMI 17.77 kg/m   Physical Exam Vitals and nursing note reviewed.   Gen: NAD Eyes: PERRL, EOMI HEENT: no oropharyngeal swelling Neck: trachea midline Resp: clear to auscultation bilaterally Card: RRR, no murmurs, rubs, or gallops Abd: nontender, nondistended Extremities: no calf tenderness, no edema Vascular: 2+ radial pulses bilaterally, 2+ DP pulses bilaterally Neuro: Right-sided gaze preference, left-sided weakness noted, please see NIH stroke scale from stroke neurology team who evaluated the patient on arrival Skin: no rashes Psyc: acting appropriately   (all labs ordered are listed, but only abnormal results are displayed) Labs Reviewed  CBC - Abnormal; Notable for the following components:      Result Value   RBC 3.94 (*)    Hemoglobin 12.5 (*)    HCT 36.0 (*)    Platelets 119 (*)    All other components within normal limits  COMPREHENSIVE METABOLIC PANEL WITH GFR - Abnormal; Notable for the following components:   Glucose, Bld 103 (*)    Creatinine, Ser 1.27 (*)    All other components within normal limits  VALPROIC  ACID LEVEL - Abnormal; Notable for the following components:   Valproic  Acid Lvl 133 (*)    All other components within normal limits  I-STAT CHEM 8, ED - Abnormal; Notable for the following components:   Creatinine, Ser 1.30 (*)    Hemoglobin 12.9 (*)    HCT 38.0 (*)    All other components within normal limits  CBG MONITORING, ED - Abnormal; Notable for the following components:   Glucose-Capillary 107  (*)    All other components within normal limits  CULTURE, BLOOD (ROUTINE X 2)  CULTURE, BLOOD (ROUTINE X 2)  PROTIME-INR  APTT  DIFFERENTIAL  ETHANOL  LACTIC ACID, PLASMA  URINALYSIS, ROUTINE W REFLEX MICROSCOPIC  LAMOTRIGINE  LEVEL  LACTIC ACID, PLASMA    EKG: EKG Interpretation Date/Time:  Thursday July 02 2024 12:43:30 EDT Ventricular Rate:  74 PR Interval:  122 QRS Duration:  83 QT Interval:  368 QTC Calculation: 409 R Axis:   35  Text Interpretation: Sinus rhythm Confirmed by Ula Barter [phone]) on 07/02/2024 12:54:19 PM  Radiology: MR BRAIN WO CONTRAST Result Date: 07/02/2024 CLINICAL DATA:  Neuro deficit, acute, stroke suspected EXAM: MRI HEAD WITHOUT CONTRAST TECHNIQUE: Axial diffusion and FLAIR FLAIR sequences of the brain and surrounding structures were obtained without intravenous contrast. The patient was unable to tolerate additional imaging. COMPARISON:  CT  of the head dated July 02, 2024 and MRI of the head dated May 14, 2024. FINDINGS: Brain: There is no restricted diffusion demonstrated to indicate acute or recent infarction. There are extensive encephalomalacia changes present within the right frontal lobe and there is extensive surrounding gliosis within the right frontal and parietal lobes. There is ex vacuo dilatation of the right lateral ventricle. The ventricles are prominent in proportion to the cerebral sulci. No evidence of mass or hemorrhage. Vascular: Arterial flow voids appear to be present. Skull and upper cervical spine: Limited secondary to motion, but grossly unremarkable. Sinuses/Orbits: Distorted.  No obvious abnormality. Other: Limited study. IMPRESSION: 1. Limited study demonstrating no evidence of acute or recent infarction. There is extensive right frontal encephalomalacia and surrounding gliosis. Electronically Signed   By: Evalene Coho M.D.   On: 07/02/2024 14:25   DG Chest Portable 1 View Result Date: 07/02/2024 CLINICAL DATA:  Hypotension  EXAM: PORTABLE CHEST 1 VIEW COMPARISON:  05/25/2023 FINDINGS: Degenerative glenohumeral arthropathy bilaterally. Low lung volumes are present, causing crowding of the pulmonary vasculature. The lungs appear clear. No blunting of the costophrenic angles. Heart size within normal limits. IMPRESSION: 1. Low lung volumes, causing crowding of the pulmonary vasculature. 2. Degenerative glenohumeral arthropathy bilaterally. Electronically Signed   By: Ryan Salvage M.D.   On: 07/02/2024 13:25   CT HEAD CODE STROKE WO CONTRAST Result Date: 07/02/2024 CLINICAL DATA:  Code stroke.  Acute neurological deficit. EXAM: CT HEAD WITHOUT CONTRAST TECHNIQUE: Contiguous axial images were obtained from the base of the skull through the vertex without intravenous contrast. RADIATION DOSE REDUCTION: This exam was performed according to the departmental dose-optimization program which includes automated exposure control, adjustment of the mA and/or kV according to patient size and/or use of iterative reconstruction technique. COMPARISON:  CT the head dated June 29, 2024. FINDINGS: Brain: There are extensive encephalomalacia changes again demonstrated within the right frontal lobe in the distribution of breast in the middle cerebral artery and anterior cerebral artery. There also encephalomalacia changes within the right internal and external capsules and there is ex vacuo dilatation of the anterior horn of the right lateral ventricle. There is no significant change from the previous study. No evidence of hemorrhage, mass or hydrocephalus. Vascular: Mild atheromatous calcification. Skull: Status post frontal craniotomy.  No osseous lesions present. Sinuses/Orbits: Clear paranasal sinuses.  Normal orbits. Other: None. IMPRESSION: 1. Stable chronic encephalomalacia changes within the right frontal lobe. No apparent acute process. 2. ASPECTS is 10. 3. These results were communicated via the amion paging network at time of  interpretation on 07/02/2024 at 12:03 pm to provider Dr. Clayburn. Electronically Signed   By: Evalene Coho M.D.   On: 07/02/2024 12:08     Procedures   Medications Ordered in the ED  sodium chloride  flush (NS) 0.9 % injection 3 mL (3 mLs Intravenous Given by Other 07/02/24 1337)                                    Medical Decision Making 68 year old male with chronic left-sided hemiparesis presenting to the emergency department today with concern for possible worsening left-sided weakness today with last known normal at 8:30 AM.  Will further evaluate the patient here with CT scans in addition to sepsis workup for the low blood pressure.  This does seem to have improved.  Will give the patient some IV fluids and reevaluate for ultimate disposition.  Of note, he did appear to present very similarly a few days ago.  The patient's initial workup is reassuring.  His case is discussed with Dr. Jerrie.  She recommends decreasing some of his medications if urinalysis does not show any findings concerning for infection.  If the patient's urinalysis does show some findings concerning for infection plan is for admission for IV antibiotics given his change in mental status but if negative this could certainly be medication related and it seems that the patient did have his lamotrigine  increased last month prior to these episodes so decreasing and outpatient follow-up would be reasonable.  CRITICAL CARE Performed by: Prentice JONELLE Medicus   Total critical care time: 35 minutes  Critical care time was exclusive of separately billable procedures and treating other patients.  Critical care was necessary to treat or prevent imminent or life-threatening deterioration.  Critical care was time spent personally by me on the following activities: development of treatment plan with patient and/or surrogate as well as nursing, discussions with consultants, evaluation of patient's response to treatment, examination of  patient, obtaining history from patient or surrogate, ordering and performing treatments and interventions, ordering and review of laboratory studies, ordering and review of radiographic studies, pulse oximetry and re-evaluation of patient's condition.   Amount and/or Complexity of Data Reviewed Labs: ordered. Radiology: ordered.        Final diagnoses:  Confusion    ED Discharge Orders     None          Medicus Prentice JONELLE, MD 07/02/24 1541

## 2024-07-02 NOTE — ED Provider Notes (Signed)
 3:51 PM Assumed care of patient from off-going team. For more details, please see note from same day.  In brief, this is a 68 y.o. male w/ h/o L hemiplegia from tumor resection, epilepsy, schizophrenia, presented w/ c/f L-sided weakness/dysarthria compared to his baseline. Family says for a couple of weeks.   Plan/Dispo at time of sign-out & ED Course since sign-out: [ ]  Neurology recommendations  BP 104/80   Pulse 75   Temp (!) 96.5 F (35.8 C) (Tympanic)   Wt 53 kg   SpO2 95%   BMI 17.77 kg/m    ED Course:   Neurology recommends admission to hospitalist for stroke w/u    Dispo: Admit to hospitalist ------------------------------- Sid Boning, MD Emergency Medicine  This note was created using dictation software, which may contain spelling or grammatical errors.   Boning Sid SAILOR, MD 07/02/24 2013

## 2024-07-02 NOTE — ED Notes (Signed)
 The sister, Marval, would like pt to be observe over night. EDP notified.

## 2024-07-02 NOTE — Telephone Encounter (Signed)
 Message to schedule ambulatory eeg sent to [facility]

## 2024-07-02 NOTE — ED Notes (Signed)
 Pt is a hard stick. EMS, primary and secondary RN attempted access without success. IV US  consulted EDP aware

## 2024-07-02 NOTE — Code Documentation (Signed)
 Stroke Response Nurse Documentation Code Documentation  Taiyo Kozma Gramm is a 68 y.o. male arriving to [facility]  via [facility] on 07/02/24 with past medical hx of HTN, epilepsy, schizophrenia, L hemiplegia from tumor resection. On No antithrombotic. Code stroke was activated by EMS.   Patient from [facility] nursing facility where he was LKW at 0830 and now complaining of worsening L sided weakness and dysarthria.   Stroke team at the bedside on patient arrival. Labs drawn and patient cleared for CT. Patient to CT with team. NIHSS 11, see documentation for details and code stroke times. The following imaging was completed:  CT Head and MRI. Patient is not a candidate for IV Thrombolytic due to symptoms improving. Patient is not a candidate for IR due to no LVO.   Care Plan: Code Stroke cancelled at 1230 per Dr Jerrie.   Process Delays Noted: difficult stick, no IV access or labs drawn at bridge  Bedside handoff with ED RN.    Nat Blunt  Stroke Response RN

## 2024-07-02 NOTE — Plan of Care (Signed)
 FMTS Interim Progress Note  S: Seen at bedside for nighttime rounds.  Patient with no complaints.  He requested a urinal from the RN.   O: BP 125/85 (BP Location: Left Arm)   Pulse 76   Temp 98 F (36.7 C) (Oral)   Resp 18   Wt 53 kg   SpO2 98%   BMI 17.77 kg/m   General: Alert no acute distress HEENT: NCAT, EOM grossly normal, midline nasal septum Respiratory: Breathing and speaking comfortably on RA Neurological: Strength 4 out of 5 in the right upper and lower extremities, strength 3 out of 5 in the left upper and lower extremities, patient following most commands, difficulty with completing entire neurological exam due to patient's aphasia and difficulty understanding some exam maneuvers Psychiatric: Aphasic, mildly agitated  A/P: Encephalopathy, questionably due to sedating medications Appears less sedated at this time compared to admission exam.  He is able to follow most commands though does continue to express aphasia (versus baseline developmental delay) which appears chronic.  Left-sided weakness worse on the right also noted, which seems to be different from baseline, though it is stable from admission exam.  Neurology consulted who also interpreted MRI without acute stroke.  Depakote  dosing has been switched to 500 twice daily.  Will also change Seroquel  dosing to 200 mg twice daily per hospital discharge in June 2024.  Remainder of plan per day team.  Tharon Lung, MD 07/02/2024, 11:07 PM PGY-3, [facility] Service pager [phone]

## 2024-07-02 NOTE — ED Triage Notes (Signed)
 Pt bib ems from [facility] in [facility] c/o left side deficits. Slurred speech, right side gaze, bilateral weak grips, and inappropriate speech.   Pt initial pressure 60/40 and pt was placed in reverse trendelenburg BP 106/52.   No access for IV fluids  CBG 179 HR 74 Sinus  RA 97%

## 2024-07-03 DIAGNOSIS — G9340 Encephalopathy, unspecified: Secondary | ICD-10-CM | POA: Diagnosis not present

## 2024-07-03 DIAGNOSIS — I952 Hypotension due to drugs: Secondary | ICD-10-CM

## 2024-07-03 DIAGNOSIS — Z7189 Other specified counseling: Secondary | ICD-10-CM

## 2024-07-03 DIAGNOSIS — Z515 Encounter for palliative care: Secondary | ICD-10-CM

## 2024-07-03 DIAGNOSIS — E440 Moderate protein-calorie malnutrition: Secondary | ICD-10-CM | POA: Diagnosis present

## 2024-07-03 LAB — BASIC METABOLIC PANEL WITH GFR
Anion gap: 13 (ref 5–15)
BUN: 8 mg/dL (ref 8–23)
CO2: 23 mmol/L (ref 22–32)
Calcium: 9.4 mg/dL (ref 8.9–10.3)
Chloride: 104 mmol/L (ref 98–111)
Creatinine, Ser: 0.99 mg/dL (ref 0.61–1.24)
GFR, Estimated: >60 mL/min (ref >60)
Glucose, Bld: 105 mg/dL — ABNORMAL HIGH (ref 70–99)
Potassium: 3.7 mmol/L (ref 3.5–5.1)
Sodium: 140 mmol/L (ref 135–145)

## 2024-07-03 LAB — PHOSPHORUS: Phosphorus: 2.6 mg/dL (ref 2.5–4.6)

## 2024-07-03 LAB — HEMOGLOBIN A1C
Hgb A1c MFr Bld: 5.4 % (ref 4.8–5.6)
Mean Plasma Glucose: 108 mg/dL

## 2024-07-03 LAB — LAMOTRIGINE LEVEL: Lamotrigine Lvl: 12.9 ug/mL (ref 2.0–20.0)

## 2024-07-03 LAB — MAGNESIUM: Magnesium: 2.2 mg/dL (ref 1.7–2.4)

## 2024-07-03 LAB — TSH: TSH: <0.01 u[IU]/mL — ABNORMAL LOW (ref 0.350–4.500)

## 2024-07-03 MED ORDER — ENSURE PLUS HIGH PROTEIN PO LIQD
237.0000 mL | Freq: Two times a day (BID) | ORAL | Status: DC
  Administered 2024-07-03 – 2024-07-04 (×2): 237 mL via ORAL

## 2024-07-03 NOTE — Progress Notes (Signed)
 Initial Nutrition Assessment  DOCUMENTATION CODES:   Underweight, Non-severe (moderate) malnutrition in context of chronic illness (schizoaffective disorder, Barrett's esophagus, dysphagia, GERD, gastroparesis)  INTERVENTION:  48 hour calorie count: Please hang calorie count envelope on the patient's door. Document percent consumed for each item on the patient's meal tray ticket and keep in envelope. Also document percent of any supplement or snack pt consumes and keep documentation in envelope for RD to review.   Encouraged PO intake   Will monitor SLP treatment and recommendations  Will monitor GOC discussions  Ensure Plus High Protein po BID, each supplement provides 350 kcal and 20 grams of protein.  NUTRITION DIAGNOSIS:   Moderate Malnutrition related to chronic illness (schizoaffective disorder, Barrett's esophagus, dysphagia, GERD, gastroparesis) as evidenced by moderate muscle depletion, moderate fat depletion.  GOAL:   Patient will meet greater than or equal to 90% of their needs  MONITOR:   Supplement acceptance, PO intake  REASON FOR ASSESSMENT:   Consult Assessment of nutrition requirement/status, Calorie Count  ASSESSMENT:   Pt with hx of schizoaffective disorder, seizures, Barrett's esophagus, dysphagia, IDA, GERD, gastroparesis, prior stroke, and type II diabetes. Hx of PEG placement (2021). Pt admitted with AMS, dehydration, nutritional deficiency, and medication overdosing; stroke ruled out.  Pt with continued encephalopathy during assessment. SLP attempting bedside swallow eval due to pt's hx of dysphagia, but pt had continued lethargy and did not complete full assessment. SLP determined pt's baseline is DYS 2 diet, but suspects lethargy will affect intake. No family at bedside to provide any diet/wt hx, will attempt to obtain at follow up. Pt unable to answer questions at this time. MD ordered calorie count to monitor pt's intake more closely, discussed with  RN and encouraged thorough documentation over the next few days. Pt has lengthy hx of esophageal issues that may impact diet intake chronically. Suspect chronic undernutrition prior to admission based on physical exam and barriers to swallow. Recommend thorough GOC discussion and appreciate palliative care consult. Will monitor results of calorie count and outline further nutrition interventions based on results, but suspect needs will most likely not be met PO.   Per chart review, pt recently weighed outpatient as 54.4kg. Pt weighed 65.8kg 06/26/23 which indicates a 17% loss in 1 year which is not clinically significant for that time frame but unable to find any weights in chart between those points. If wt loss occurred in shorter time frame, would be considered clinically significant.  Medications reviewed and include:  Levothyroxine  Protonix  Miralax  Thiamine  500mg  IVPB  Labs reviewed  NUTRITION - FOCUSED PHYSICAL EXAM:  Flowsheet Row Most Recent Value  Orbital Region Moderate depletion  Upper Arm Region Moderate depletion  Thoracic and Lumbar Region No depletion  Buccal Region Moderate depletion  Temple Region Moderate depletion  Clavicle Bone Region Moderate depletion  Clavicle and Acromion Bone Region Moderate depletion  Scapular Bone Region Moderate depletion  Dorsal Hand Mild depletion  Patellar Region Moderate depletion  Anterior Thigh Region Moderate depletion  Posterior Calf Region Moderate depletion  Edema (RD Assessment) None  Hair Reviewed  Eyes Unable to assess  [would not open eyes]  Mouth Unable to assess  [would not open mouth]  Skin Reviewed  Nails Reviewed    Diet Order:   Diet Order             DIET DYS 2 Room service appropriate? No; Fluid consistency: Thin  Diet effective now  EDUCATION NEEDS:   Not appropriate for education at this time  Skin:  Skin Assessment: Reviewed RN Assessment  Last BM:  PTA  Height:   Ht Readings  from Last 1 Encounters:  06/29/24 5' 8 (1.727 m)    Weight:   Wt Readings from Last 1 Encounters:  07/02/24 53 kg    Ideal Body Weight:  70 kg  BMI:  Body mass index is 17.77 kg/m.  Estimated Nutritional Needs:   Kcal:  1800-2000  Protein:  75-90g  Fluid:  1.8-2L    Josette Glance, MS, RDN, LDN Clinical Dietitian I Please reach out via secure chat

## 2024-07-03 NOTE — Assessment & Plan Note (Addendum)
 Chronic, progressive with no clear etiology but with hx of dysphagia suspect progression of neurologic illness vs new onset dementia contributing. Does have hx of severe GERD and Barrett esophagus, increasing risk for malignancy. - Refeeding labs: Phos, BMP, Mag pending for today - AM labs: BMP, Phos, Mag - Dysphagia 3 diet - Strict Is and Os - Nutrition consulted and to do calorie count, appreciate recommendations - SLP consulted, appreciate recommendations - Recommending Dysphagia 2 diet - Consider GI consult for endoscopy to evaluate esophageal dysmotility

## 2024-07-03 NOTE — Evaluation (Signed)
 Physical Therapy Evaluation Patient Details Name: [name] MRN: [id number] DOB: 06-29-1956 Today's Date: 07/03/2024  History of Present Illness  Patient is a 68 y/o male 68 y.o. male presenting with encephalopathy. MRI 7/10 no evidence of acute or recent infarction. There is extensive right frontal encephalomalacia and surrounding gliosis.  PMH positive for CVA with L hemiplegia, DD, seizure disorder, schizophrenia, GERD with Barrett's esophagus, HTN, h/o GIB, and hypothyroid.   Clinical Impression  Per chart review, pt from group home and primarily uses a WC for mobility. Pt required ModA/MinA for bed mobility and MinA for step-pivot to recliner with use of RW. Recommending return to group home with OP PT to work on independence with mobility and to improve quality of life. All further needs to be addressed at next venue of care. Acute PT signing off. Please re-consult if new needs arise.         If plan is discharge home, recommend the following: A lot of help with walking and/or transfers;A lot of help with bathing/dressing/bathroom;Assistance with cooking/housework;Assistance with feeding;Direct supervision/assist for medications management;Help with stairs or ramp for entrance;Assist for transportation;Direct supervision/assist for financial management     Equipment Recommendations None recommended by PT     Functional Status Assessment Patient has had a recent decline in their functional status and demonstrates the ability to make significant improvements in function in a reasonable and predictable amount of time.     Precautions / Restrictions Precautions Precautions: Fall Recall of Precautions/Restrictions: Impaired Restrictions Weight Bearing Restrictions Per Provider Order: No      Mobility  Bed Mobility Overal bed mobility: Needs Assistance Bed Mobility: Supine to Sit, Sit to Supine   Supine to sit: Mod assist Sit to supine: Min assist   General bed mobility  comments: able to bring LE's towards EOB, ModA for trunk elevation and to complete moving LE's off EOB. MinA to lift LE's back to supine    Transfers Overall transfer level: Needs assistance Equipment used: Rolling walker (2 wheels) Transfers: Bed to chair/wheelchair/BSC, Sit to/from Stand Sit to Stand: Min assist   Step pivot transfers: Min assist    General transfer comment: cues to bring feet flat and for hand placement. MinA for boost-up and steadying. Able to perform step-pivot to/from recliner with MinA.      Balance Overall balance assessment: Needs assistance Sitting-balance support: Feet supported Sitting balance-Leahy Scale: Fair     Standing balance support: Bilateral upper extremity supported, Reliant on assistive device for balance, During functional activity Standing balance-Leahy Scale: Poor Standing balance comment: reliant on UE and external support       Pertinent Vitals/Pain Pain Assessment Pain Assessment: Faces Faces Pain Scale: Hurts a little bit Pain Location: knee Pain Descriptors / Indicators: Discomfort Pain Intervention(s): Limited activity within patient's tolerance, Monitored during session, Repositioned    Home Living Family/patient expects to be discharged to:: Group home Living Arrangements: Group Home Available Help at Discharge: Personal care attendant;Available 24 hours/day Type of Home: Group Home Home Access: Level entry       Home Layout: One level Home Equipment: Wheelchair - Forensic psychologist (2 wheels) Additional Comments: Info taken from chart review    Prior Function Prior Level of Function : Patient poor historian/Family not available;Needs assist  Mobility Comments: Reports gets around in wheelchair at group home though completes transfers on his own and BADL's except showering       Extremity/Trunk Assessment   Upper Extremity Assessment Upper Extremity Assessment: Defer to OT evaluation  Lower Extremity  Assessment Lower Extremity Assessment: RLE deficits/detail;LLE deficits/detail RLE Deficits / Details: At least 3/5, WFL ROM LLE Deficits / Details: At least 3/5. Limited AROM for knee extension and ankle DF/PF, full PROM with slight resistances towards end ROM for knee extension       Communication   Communication Communication: Impaired Factors Affecting Communication: Reduced clarity of speech;Difficulty expressing self    Cognition Arousal: Alert Behavior During Therapy: Impulsive   PT - Cognitive impairments: No family/caregiver present to determine baseline    PT - Cognition Comments: Cues for safety with pt moving quickly and impulsively Following commands: Impaired Following commands impaired: Follows one step commands inconsistently     Cueing Cueing Techniques: Verbal cues, Tactile cues, Visual cues     General Comments General comments (skin integrity, edema, etc.): VSS on RA    Exercises General Exercises - Lower Extremity Long Arc Quad: AROM, Both, 5 reps, Seated Hip Flexion/Marching: AROM, 5 reps, Both, Seated   Assessment/Plan    PT Assessment All further PT needs can be met in the next venue of care  PT Problem List Decreased strength;Decreased range of motion;Decreased activity tolerance;Decreased balance;Decreased mobility;Decreased cognition;Decreased knowledge of use of DME;Decreased safety awareness        AM-PAC PT 6 Clicks Mobility  Outcome Measure Help needed turning from your back to your side while in a flat bed without using bedrails?: A Little Help needed moving from lying on your back to sitting on the side of a flat bed without using bedrails?: A Lot Help needed moving to and from a bed to a chair (including a wheelchair)?: A Little Help needed standing up from a chair using your arms (e.g., wheelchair or bedside chair)?: A Little Help needed to walk in hospital room?: Total Help needed climbing 3-5 steps with a railing? : Total 6 Click  Score: 13    End of Session Equipment Utilized During Treatment: Gait belt Activity Tolerance: Patient tolerated treatment well Patient left: in bed;with call bell/phone within reach;with bed alarm set Nurse Communication: Mobility status PT Visit Diagnosis: Unsteadiness on feet (R26.81);Other abnormalities of gait and mobility (R26.89);Muscle weakness (generalized) (M62.81);Hemiplegia and hemiparesis Hemiplegia - Right/Left: Left Hemiplegia - caused by: Cerebral infarction    Time: 1400-1418 PT Time Calculation (min) (ACUTE ONLY): 18 min   Charges:   PT Evaluation $PT Eval Moderate Complexity: 1 Mod   PT General Charges $$ ACUTE PT VISIT: 1 Visit       Kate ORN, PT, DPT Secure Chat Preferred  Rehab Office [phone]   Kate BRAVO Wendolyn 07/03/2024, 2:26 PM

## 2024-07-03 NOTE — Evaluation (Signed)
 Occupational Therapy Evaluation Patient Details Name: [name] MRN: [id number] DOB: 22-Aug-1956 Today's Date: 07/03/2024   History of Present Illness   Patient is a 68 y/o male 68 y.o. male presenting with encephalopathy. MRI 7/10 no evidence of acute or recent infarction. There is extensive right frontal encephalomalacia and surrounding gliosis.  PMH positive for CVA with L hemiplegia, DD, seizure disorder, schizophrenia, GERD with Barrett's esophagus, HTN, h/o GIB, and hypothyroid.     Clinical Impressions Hx obtained form chart as pt unable to give hx and family not available. Pt from group home and uses a wc for mobility and assists with ADL tasks. Pt able to transfer to bed with min A and overall min to mod A with ADL tasks. Recommend return to Group home with follow up HHOT given his decline in function over the last 2 weeks per chart review. All further OT to be addressed at Group home.      If plan is discharge home, recommend the following:   A little help with walking and/or transfers;A little help with bathing/dressing/bathroom;Direct supervision/assist for medications management;Direct supervision/assist for financial management;Assist for transportation;Help with stairs or ramp for entrance     Functional Status Assessment   Patient has not had a recent decline in their functional status     Equipment Recommendations         Recommendations for Other Services         Precautions/Restrictions   Precautions Precautions: Fall Restrictions Weight Bearing Restrictions Per Provider Order: No     Mobility Bed Mobility Overal bed mobility: Needs Assistance Bed Mobility: Sit to Supine       Sit to supine: Min assist   General bed mobility comments: Min A to lift B feet onto bed    Transfers Overall transfer level: Needs assistance   Transfers: Bed to chair/wheelchair/BSC   Stand pivot transfers: Min assist Squat pivot transfers:   (toward R strong side)              Balance Overall balance assessment: Needs assistance Sitting-balance support: Feet supported Sitting balance-Leahy Scale: Fair       Standing balance-Leahy Scale: Poor                             ADL either performed or assessed with clinical judgement   ADL Overall ADL's : Needs assistance/impaired Eating/Feeding: Set up   Grooming: Minimal assistance   Upper Body Bathing: Moderate assistance   Lower Body Bathing: Moderate assistance   Upper Body Dressing : Moderate assistance   Lower Body Dressing: Moderate assistance               Functional mobility during ADLs: Minimal assistance (stand/squat pivot)       Vision Patient Visual Report:  (R gaze preference but able to attend at baseline)       Perception         Praxis         Pertinent Vitals/Pain Pain Assessment Pain Assessment: Faces Faces Pain Scale: Hurts a little bit Pain Location: knee Pain Descriptors / Indicators: Discomfort Pain Intervention(s): Limited activity within patient's tolerance     Extremity/Trunk Assessment Upper Extremity Assessment Upper Extremity Assessment: Right hand dominant;LUE deficits/detail LUE Deficits / Details: contracted in flexed posture; attempts to use os assist; most likely baseline           Communication Communication Communication: Impaired Factors Affecting Communication: Reduced clarity of speech;Difficulty  expressing self   Cognition Arousal: Alert Behavior During Therapy: Impulsive Cognition: History of cognitive impairments, No family/caregiver present to determine baseline (most likely close to baseline; following 1 step commands inconsistently)                               Following commands: Impaired Following commands impaired: Follows one step commands inconsistently     Cueing  General Comments          Exercises     Shoulder Instructions      Home Living  Family/patient expects to be discharged to:: Group home                                        Prior Functioning/Environment Prior Level of Function : Patient poor historian/Family not available;Needs assist             Mobility Comments: Reports gets around in wheelchair at group home though completes transfers on his own and BADL's except showering      OT Problem List: Impaired balance (sitting and/or standing)   OT Treatment/Interventions:        OT Goals(Current goals can be found in the care plan section)   Acute Rehab OT Goals Patient Stated Goal: unable to state OT Goal Formulation: All assessment and education complete, DC therapy   OT Frequency:       Co-evaluation              AM-PAC OT 6 Clicks Daily Activity     Outcome Measure Help from another person eating meals?: A Little Help from another person taking care of personal grooming?: A Little Help from another person toileting, which includes using toliet, bedpan, or urinal?: A Lot Help from another person bathing (including washing, rinsing, drying)?: A Lot Help from another person to put on and taking off regular upper body clothing?: A Lot Help from another person to put on and taking off regular lower body clothing?: A Lot 6 Click Score: 14   End of Session Equipment Utilized During Treatment: Gait belt Nurse Communication: Other (comment);Mobility status (IV beeping)  Activity Tolerance: Patient tolerated treatment well Patient left: in bed;with call bell/phone within reach;with bed alarm set  OT Visit Diagnosis: Other abnormalities of gait and mobility (R26.89)                Time: 8988-8966 OT Time Calculation (min): 22 min Charges:  OT General Charges $OT Visit: 1 Visit OT Evaluation $OT Eval Moderate Complexity: 1 Mod  Kourtnei Rauber, OT/L   Acute OT Clinical Specialist Acute Rehabilitation Services Pager [phone] Office [phone]    [facility] 07/03/2024, 10:50 AM

## 2024-07-03 NOTE — Consult Note (Signed)
 Palliative Care Consult Note                                  Date: 07/03/2024   Patient Name: [name]  DOB: Jan 12, 1956  MRN: [id number]  Age / Sex: 68 y.o., male  PCP: Royals, Philis HERO Referring Physician: McDiarmid, Krystal BIRCH, MD  Reason for Consultation: Establishing goals of care  HPI/Patient Profile: 68 y.o. male  with past medical history of CVA with L hemiplegia, developmental delay, seizure disorder, schizophrenia, GERD with Barrett's esophagus, HTN, h/o GIB, and hypothyroid admitted on 07/02/2024 with as a code stroke for left-sided weakness and speech difficulty worse than baseline. MRI 7/10 no evidence of acute or recent infarction. There is extensive right frontal encephalomalacia and surrounding gliosis. Etiology of medicine burden/polypharmacy versus natural progression of neurological conditions versus new onset dementia.   Past Medical History:  Diagnosis Date   Barrett esophagus    DM II (diabetes mellitus, type II), controlled (HCC) 11/13/2020   Dysphagia    Gastroparesis    GERD (gastroesophageal reflux disease)    Hypercholesteremia    Hypertension    Iron deficiency anemia    Mental retardation    Schizophrenia (HCC)    Seizure (HCC)    Stroke (HCC)     Subjective:   I have reviewed medical records including EPIC notes, labs and imaging, assessed the patient and then spoke with the patients sister Marval Mulberry (who is HCPOA) to discuss diagnosis prognosis, GOC, EOL wishes, disposition and options.  I introduced Palliative Medicine as specialized medical care for people living with serious illness. It focuses on providing relief from symptoms and stress of a serious illness. The goal is to improve quality of life for both the patient and the family.  Today's Discussion: Patient sleeping in NAD.  He does not easily awake when I call his name so I let him sleep.  No family at bedside.  Called his  sister Marval Mulberry who is also his healthcare power of attorney to discuss patient's goals of care.  Patient's sister has a very good understanding of the patient's chronic conditions and current hospitalization as she has been responsible for the patient's health care since their mother died in 50.  We discussed the patient's recent weight loss and dysphagia.  We discussed the patient's current medications and the concern that polypharmacy may be attributing to his encephalopathy.  We discussed the concern that the patient may be developing dementia but that we must rule out reversible causes for his mental status change first.  Patient's sister is grateful that he is hospitalized and all of these things are being investigated.  The patient is a resident of [facility] group home where he has lived for several years.  The patient's sister has had some concern over the years with the care here and she visits the patient several times a week to check on the patient.  The group home is very close to [name]'s house which helps her to be able to see him.  They have 3 other siblings but the siblings do not participate in his care making [name] the  sole Education officer, environmental.  We discussed advanced directives. Recommended consideration of DNR status, understanding evidenced-based poor outcomes in similar hospitalized patients, as the cause of the arrest is likely associated with chronic/terminal disease rather than a reversible acute cardio-pulmonary event.  We discussed scopes of care and the difference between an aggressive medical intervention path and a comfort focused path.  We spent some time discussing intubation.  In a previous hospitalization the patient was intubated for close to 30 days.  [name] shared how hard it was to watch her brother during this time.  We discussed that if he were to be intubated again he would likely have a hard time getting off the ventilator.  I encouraged her to consider making the patient  DNR/DNI.  [name] understandably becomes tearful she says this is the first time she has realized her brothers decline.  She needs time to consider her options.  Emailed her the hard choices booklet to review.  For now the patient remains full code and full scope.  Marval would like to reach out to PMT after she has considered her options to potentially complete a MOST form.  Discussed the importance of continued conversation with family and the medical providers regarding overall plan of care and treatment options, ensuring decisions are within the context of the patient's values and GOCs.  Questions and concerns were addressed.  Therapeutic listening and emotional support provided.  Marval was encouraged to call with questions or concerns. PMT will continue to support holistically.  Review of Systems  All other systems reviewed and are negative.   Objective:   Primary Diagnoses: Present on Admission:  Encephalopathy  Malnutrition (HCC)   Physical Exam Vitals reviewed.  Constitutional:      General: He is not in acute distress.    Appearance: He is cachectic.  Cardiovascular:     Rate and Rhythm: Normal rate.  Pulmonary:     Effort: Pulmonary effort is normal.     Vital Signs:  BP (!) 115/48 (BP Location: Left Arm)   Pulse 93   Temp 97.9 F (36.6 C) (Oral)   Resp 16   Wt 53 kg   SpO2 99%   BMI 17.77 kg/m    Advanced Care Planning:   Existing Vynca/ACP Documentation: None  Primary Decision Maker: HCPOA  Code Status/Advance Care Planning: Full code   Assessment & Plan:   SUMMARY OF RECOMMENDATIONS   Full code  Full scope Encouraged sister to consider goals of care moving forward including code status- sent Hard Choices booklet for her to review PMT will continue to support- sister to reach out when prepared to complete MOST form    Discussed with: attending team  Time Total: 105 minutes    Thank you for allowing us  to participate in the care of  GARISON GENOVA PMT will continue to support holistically.    Signed by: Stephane Palin, NP Palliative Medicine Team  Team Phone # [phone] (Nights/Weekends)  07/03/2024, 6:05 PM

## 2024-07-03 NOTE — Assessment & Plan Note (Signed)
-   Admit to [facility] inpatient, Progressive unit, attending Dr. McDiarmid - Neuro checks Q4h - Seizure precautions - PT/OT eval and treat - Consult to palliative care per discussion with sister - AM BMP - Appreciate neurology recommendations: - Suspect oversedation - Check ammonia, B12, thiamine  - Consider decreasing quetiapine  - Repeat Depakote  1900, reduced to 250 mg TID, but can return to home 500 mg BID dosing if WNL - Continue lamotrigine  125 mg nightly - Workup for other etiologies of hypotension - Increased Depakote  back to home 500 mg BID, as repeat level was WNL - Decreased quetiapine  to 200 mg BID from 300 mg BID

## 2024-07-03 NOTE — Progress Notes (Signed)
 Daily Progress Note Intern Pager: [phone]  Patient name: [name] Medical record number: [id number] Date of birth: 12/18/1956 Age: 68 y.o. Gender: male  Primary Care Provider: Hilma Philis HERO Consultants: Neurology (signed off), palliative Code Status: FULL  Pt Overview and Major Events to Date:  - 7/10: Admitted, Neurology and Palliative consulted - 7/11: Neurology signed off  Medical Decision Making:  [name] is a 68 y.o. male admitted for encephalopathy.   Hx right-sided weakness, presented with left sided weakness, stroke ruled out by MRI. Right frontal lobe encephalomalacia by CT, known and source of seizures. Known expressive aphasia  Etiology of medicine burden/polypharmacy versus natural progression of neurological conditions vs new onset dementia. Most strongly suspect oversedation from medication with Seroquel  dose too high, has since been decreased with improvement in pt's mental status. Ammonia level normal. Vitamin B12 elevated, thiamine  pending. Could also be dementia onset given deteriorating picture over course of months in setting of congenital neuro abnormalities.  Pertinent PMH/PSH includes seizure disorder, schizophrenia, HTN, HLD, T2DM, hypothyroidism, dysphagia, benign brain tumor s/p craniotomy in 2004, developmental delay.  Assessment & Plan Encephalopathy Pt currently on Depakote  500 mg twice daily (home dose), Seroquel  200 mg twice daily (reduced from 300 mg twice daily), lamotrigine  125 mg daily at bedtime (home dose). - Depakote  of 500 mg twice daily (home dose) - Reduced Seroquel  to 200 mg twice daily; recommend continuing this dose moving forward per neurology - Labs: Thiamine , TSH pending - Discontinue neuro checks - Med tele to continue until electrolytes back - PT/OT eval and treat - Palliative consulted, awaiting GOC conversation with sister - Neurology signing off Malnutrition (HCC) Dysphagia Chronic, progressive  with no clear etiology but with hx of dysphagia suspect progression of neurologic illness vs new onset dementia contributing. Does have hx of severe GERD and Barrett esophagus, increasing risk for malignancy. - Refeeding labs: Phos, BMP, Mag pending for today - AM labs: BMP, Phos, Mag - Dysphagia 3 diet - Strict Is and Os - Nutrition consulted and to do calorie count, appreciate recommendations - SLP consulted, appreciate recommendations - Recommending Dysphagia 2 diet - Consider GI consult for endoscopy to evaluate esophageal dysmotility Chronic health problem HLD: Continue rosuvastatin  20 mg GERD: Continue pantoprazole  40 mg BID Hypothyroidism: Continue levothyroxine  175 mcg; TSH pending Schizophrenia: Quetiapine  as above Constipation: MiraLAX  BID and senna daily T2DM: A1c consistently <6 on checks, no recent values; HA1c pending    FEN/GI: Dysphagia 2 diet PPx: Lovenox  Dispo:Back to group homewith outpatient therapy at [facility] (to start 7/28) pending Goals of Care conversation, further workup of malnutrition and dysphagia. Note, [facility] group home will provide transportation when he is discharged but they are only open M-F until 5 pm.   Subjective:  No overnight events.  Today, pt does not voice any concerns, though pt not a great historian. Did express that room was cold, and accepted blanket.  Objective: Temp:  [97.4 F (36.3 C)-98 F (36.7 C)] 97.9 F (36.6 C) (07/11 0745) Pulse Rate:  [66-81] 66 (07/11 0745) Resp:  [16-19] 16 (07/11 0745) BP: (104-125)/(50-87) 104/50 (07/11 0745) SpO2:  [94 %-99 %] 98 % (07/11 0745) Physical Exam: General: Pt seated in recliner, no acute distress. Cardiovascular: Regular rate and rhythm, no murmurs/rubs/gallops. Respiratory: Normal work of breathing on room air. Clear to auscultation bilaterally; no wheezes, crackles. Abdomen: Bowel sounds present and normoactive bilaterally. Soft, nondistended, nontender. Extremities: Skin  warm, dry. No bilateral lower extremity edema. Neuro: Pt alert  and oriented to place (city; does not know more specifics like hospital); not oriented to self (repeated John when asked for name) or time (stated day of the week was Wednesday). Per chart review, pt with known expressive aphasia. Intermittently appropriate responses to questions.  Laboratory: Labs for today 7/11 still to be collected Most recent CBC Lab Results  Component Value Date   WBC 5.5 07/02/2024   HGB 12.9 (L) 07/02/2024   HCT 38.0 (L) 07/02/2024   MCV 91.4 07/02/2024   PLT 119 (L) 07/02/2024   Most recent BMP    Latest Ref Rng & Units 07/02/2024    1:21 PM  BMP  Glucose 70 - 99 mg/dL 93   BUN 8 - 23 mg/dL 16   Creatinine 9.38 - 1.24 mg/dL 8.69   Sodium 864 - 854 mmol/L 139   Potassium 3.5 - 5.1 mmol/L 3.5   Chloride 98 - 111 mmol/L 100    TSH: pending Thiamine : pending    Larraine Palma, MD 07/03/2024, 11:56 AM  PGY-1, [facility] FPTS Intern pager: [phone], text pages welcome Secure chat group [facility] Teaching Service

## 2024-07-03 NOTE — Assessment & Plan Note (Signed)
 Chronic and progressive.  No clear etiology for decreased oral intake.  Notably, has history of dysphagia.  Also with history of severe GERD and Barrett esophagus, which would increase risk for esophageal cancer.  Last colonoscopy 2019 without concern for malignancy. - Dysphagia 3 diet for now - 3/4 mIVF LR, reevaluate PO intake in AM - SLP evaluation for AM - RD consult - Consider GI consult if continually poor PO intake, consider endoscopy

## 2024-07-03 NOTE — Hospital Course (Addendum)
 [name] is a 68 y.o. year old with a history of seizure disorder, schizophrenia, HTN, HLD, T2DM, hypothyroidism, dysphagia, benign brain tumor s/p craniotomy in 2004, developmental delay who presented with encephalopathy and was admitted to the [facility] Medicine Teaching Service for same in setting of oversedation from medication.  Encephalopathy Pt initially presented for AMS with increased weakness, not waking up. MRI was negative for stroke; neurology was consulted and recommended decreasing seroquel  to 200 mg twice daily. Pt improved swiftly. Was kept on Depakote  500 mg twice daily and lamotrigine  125 mg nightly. Palliative was consulted for goals of care conversation with sister; patient's sister elected to continue to follow-up with palliative outpatient.  Malnutrition Dysphagia Pt with progressive, chronic dysphagia without clear etiology. Was seen by SLP and dietician during this admission, with recommendations for dysphagia 2 diet and Ensure supplements.   Other chronic conditions were medically managed with home medications and formulary alternatives as necessary (HLD, GERD, hypothyroidism, schizophrenia, constipation, T2DM).  PCP Follow-up Recommendations: Recheck Depakote  level; if high, consider reducing Depakote  dose Follow-up with neurology outpatient in 4-6 weeks Recheck thyroid  levels in ~4 weeks; synthroid  reduced this admission for low TSH and high T4

## 2024-07-03 NOTE — Assessment & Plan Note (Signed)
 HLD: Continue rosuvastatin  20 mg GERD: Continue pantoprazole  40 mg BID Hypothyroidism: Continue levothyroxine  175 mcg; TSH pending Schizophrenia: Quetiapine  as above Constipation: MiraLAX  BID and senna daily T2DM: A1c consistently <6 on checks, no recent values; HA1c pending

## 2024-07-03 NOTE — Assessment & Plan Note (Addendum)
 Pt currently on Depakote  500 mg twice daily (home dose), Seroquel  200 mg twice daily (reduced from 300 mg twice daily), lamotrigine  125 mg daily at bedtime (home dose). - Depakote  of 500 mg twice daily (home dose) - Reduced Seroquel  to 200 mg twice daily; recommend continuing this dose moving forward per neurology - Labs: Thiamine , TSH pending - Discontinue neuro checks - Med tele to continue until electrolytes back - PT/OT eval and treat - Palliative consulted, awaiting GOC conversation with sister - Neurology signing off

## 2024-07-03 NOTE — Plan of Care (Signed)
  Problem: Clinical Measurements: Goal: Ability to maintain clinical measurements within normal limits will improve Outcome: Progressing Goal: Will remain free from infection Outcome: Progressing Goal: Diagnostic test results will improve Outcome: Progressing Goal: Respiratory complications will improve Outcome: Progressing Goal: Cardiovascular complication will be avoided Outcome: Progressing   Problem: Activity: Goal: Risk for activity intolerance will decrease Outcome: Progressing   Problem: Elimination: Goal: Will not experience complications related to bowel motility Outcome: Progressing Goal: Will not experience complications related to urinary retention Outcome: Progressing   Problem: Safety: Goal: Ability to remain free from injury will improve Outcome: Progressing   Problem: Skin Integrity: Goal: Risk for impaired skin integrity will decrease Outcome: Progressing   

## 2024-07-03 NOTE — Evaluation (Signed)
 Clinical/Bedside Swallow Evaluation Patient Details  Name: [name] MRN: [id number] Date of Birth: [birth year]  Today's Date: 07/03/2024 Time: SLP Start Time (ACUTE ONLY): 1053 SLP Stop Time (ACUTE ONLY): 1105 SLP Time Calculation (min) (ACUTE ONLY): 12 min  Past Medical History:  Past Medical History:  Diagnosis Date   Barrett esophagus    DM II (diabetes mellitus, type II), controlled (HCC) 11/13/2020   Dysphagia    Gastroparesis    GERD (gastroesophageal reflux disease)    Hypercholesteremia    Hypertension    Iron deficiency anemia    Mental retardation    Schizophrenia (HCC)    Seizure (HCC)    Stroke [facility])    Past Surgical History:  Past Surgical History:  Procedure Laterality Date   BIOPSY  10/21/2020   Procedure: BIOPSY;  Surgeon: Avram Lupita BRAVO, MD;  Location: [facility] ENDOSCOPY;  Service: Endoscopy;;   CRANIOTOMY FOR CYST FENESTRATION     ESOPHAGOGASTRODUODENOSCOPY N/A 01/09/2015   Procedure: ESOPHAGOGASTRODUODENOSCOPY (EGD);  Surgeon: Lamar JONETTA Aho, MD;  Location: [facility] ENDOSCOPY;  Service: Endoscopy;  Laterality: N/A;   ESOPHAGOGASTRODUODENOSCOPY N/A 10/26/2020   Procedure: ESOPHAGOGASTRODUODENOSCOPY (EGD);  Surgeon: Shyrl Linnie KIDD, MD;  Location[facility] OR;  Service: Thoracic;  Laterality: N/A;   ESOPHAGOGASTRODUODENOSCOPY (EGD) WITH PROPOFOL  N/A 10/21/2020   Procedure: ESOPHAGOGASTRODUODENOSCOPY (EGD) WITH PROPOFOL ;  Surgeon: Avram Lupita BRAVO, MD;  Location: [facility] ENDOSCOPY;  Service: Endoscopy;  Laterality: N/A;   IR GASTR TUBE CONVERT GASTR-JEJ PER W/FL MOD SED  11/07/2020   PEG PLACEMENT N/A 10/26/2020   Procedure: PERCUTANEOUS ENDOSCOPIC GASTROSTOMY (PEG) PLACEMENT;  Surgeon: Shyrl Linnie KIDD, MD;  Location: [facility] OR;  Service: Thoracic;  Laterality: N/A;   XI ROBOTIC ASSISTED HIATAL HERNIA REPAIR N/A 10/26/2020   Procedure: XI ROBOTIC ASSISTED HIATAL HERNIA REPAIR;  Surgeon: Shyrl Linnie KIDD, MD;  Location: [facility] OR;  Service: Thoracic;  Laterality: N/A;    HPI:  [name] is a 68 yo male presenting to ED with encephalopathy with consideration of dehydration/nutritional deficiency vs polypharmacy. MRI negative for stroke but shows R frontal encephalomalacia. Significant weight loss noted over the past year. Seen previously by SLP in 2021 and 2024, eventually advanced to Dys 2 solids with thin liquids which appears to be consistent with his baseline. PMH includes GERD, Barrett's esophagus, T2DM, HTN, HLD, developmental delay, schizophrenia, brain cyst s/p craniotomy, seizure disorder    Assessment / Plan / Recommendation  Clinical Impression  Pt was quite lethargic today but able to participate in a limited assessment. Unable to reach pt's sister to confirm PLOF, although pt is on a chopped diet at baseline per chart review. He took sips of water  without signs clinically concerning for aspiration. Oral transit was prompt with purees and simulated Dys 2 solids but mild oral holding was observed with solids when given in larger pieces. Lethargy is suspected to impact performance this date and, when considered in addition to pt's history of esophageal dysphagia, has the potential to affect his intake. Will change diet to baseline Dys 2 solids and f/u. SLP Visit Diagnosis: Dysphagia, unspecified (R13.10)    Aspiration Risk  Mild aspiration risk    Diet Recommendation Dysphagia 2 (Fine chop);Thin liquid    Liquid Administration via: Cup;Straw Medication Administration: Crushed with puree Supervision: Staff to assist with self feeding;Full supervision/cueing for compensatory strategies Compensations: Minimize environmental distractions;Slow rate;Small sips/bites Postural Changes: Seated upright at 90 degrees;Remain upright for at least 30 minutes after po intake    Other  Recommendations Oral Care Recommendations:  Oral care BID     Assistance Recommended at Discharge    Functional Status Assessment Patient has had a recent decline in  their functional status and demonstrates the ability to make significant improvements in function in a reasonable and predictable amount of time.  Frequency and Duration min 2x/week  1 week       Prognosis Prognosis for improved oropharyngeal function: Good Barriers to Reach Goals: Cognitive deficits;Time post onset      Swallow Study   General HPI: [name] is a 68 yo male presenting to ED with encephalopathy with consideration of dehydration/nutritional deficiency vs polypharmacy. MRI negative for stroke but shows R frontal encephalomalacia. Significant weight loss noted over the past year. Seen previously by SLP in 2021 and 2024, eventually advanced to Dys 2 solids with thin liquids which appears to be consistent with his baseline. PMH includes GERD, Barrett's esophagus, T2DM, HTN, HLD, developmental delay, schizophrenia, brain cyst s/p craniotomy, seizure disorder Type of Study: Bedside Swallow Evaluation Previous Swallow Assessment: see HPI Diet Prior to this Study: Dysphagia 3 (mechanical soft);Thin liquids (Level 0) Temperature Spikes Noted: No Respiratory Status: Room air History of Recent Intubation: No Behavior/Cognition: Alert;Cooperative;Requires cueing Oral Cavity Assessment: Within Functional Limits Oral Care Completed by SLP: No Oral Cavity - Dentition: Adequate natural dentition Vision: Functional for self-feeding Self-Feeding Abilities: Needs assist Patient Positioning: Upright in bed Baseline Vocal Quality: Low vocal intensity Volitional Cough: Cognitively unable to elicit Volitional Swallow: Unable to elicit    Oral/Motor/Sensory Function Overall Oral Motor/Sensory Function: Within functional limits   Ice Chips Ice chips: Not tested   Thin Liquid Thin Liquid: Within functional limits Presentation: Straw    Nectar Thick Nectar Thick Liquid: Not tested   Honey Thick Honey Thick Liquid: Not tested   Puree Puree: Within functional limits Presentation:  Spoon   Solid     Solid: Impaired Oral Phase Functional Implications: Oral holding      Damien Blumenthal, M.A., CCC-SLP Speech Language Pathology, Acute Rehabilitation Services  Secure Chat preferred [phone]  07/03/2024,11:36 AM

## 2024-07-03 NOTE — Assessment & Plan Note (Signed)
 HLD: Continue rosuvastatin  20 mg GERD: Continue pantoprazole  40 mg BID Hypothyroidism: Continue levothyroxine  175 mcg, recheck TSH Schizophrenia: Quetiapine  as above Constipation: MiraLAX  BID and senna daily T2DM: A1c consistently <6 on checks, no recent values, will obtain updated A1c

## 2024-07-03 NOTE — TOC Initial Note (Signed)
 Transition of Care [facility]) - Initial/Assessment Note    Patient Details  Name: [name] MRN: [id number] Date of Birth: 1956-10-09  Transition of Care [facility] CM/SW Contact:    Andrez JULIANNA George, RN Phone Number: 07/03/2024, 11:52 AM  Clinical Narrative:                  Pt is from [facility] group home. CM spoke to [place] with nursing at [facility] and they plan to take the patient back when medically ready.  Current recommendations are for [facility] therapy. They can not do HH services at the group home and have to do outpatient therapy. Per Ginger pt was already scheduled for outpatient at [facility] starting on July 28th. CM will send [facility] referral that he can start on the 28th.  [facility] will provide transportation when he is discharged. They are only open M-F until 5 pm.  ToC following.  Expected Discharge Plan: Group Home Barriers to Discharge: Continued Medical Work up   Patient Goals and CMS Choice   CMS Medicare.gov Compare Post Acute Care list provided to:: Patient Represenative (must comment) Choice offered to / list presented to : Sibling (DSS)      Expected Discharge Plan and Services   Discharge Planning Services: CM Consult Post Acute Care Choice:  (outpatient at Group home) Living arrangements for the past 2 months: Group Home                                      Prior Living Arrangements/Services Living arrangements for the past 2 months: Group Home Lives with:: Facility Resident            Care giver support system in place?: Yes (comment)   Criminal Activity/Legal Involvement Pertinent to Current Situation/Hospitalization: No - Comment as needed  Activities of Daily Living      Permission Sought/Granted                  Emotional Assessment Appearance:: Appears stated age         Psych Involvement: No (comment)  Admission diagnosis:  Confusion [R41.0] Encephalopathy [G93.40] Patient Active Problem List    Diagnosis Date Noted   Encephalopathy 07/02/2024   Malnutrition (HCC) 07/02/2024   Cognitive impairment 05/15/2024   History of stroke with residual deficit 05/15/2024   AMS (altered mental status)  Weakness 05/14/2024   Chronic health problem 05/14/2024   Stroke (HCC) 05/14/2024   Polypharmacy 06/26/2023   Stroke-like symptoms 06/05/2023   Mixed hyperlipidemia 05/26/2023   Intellectual developmental delay 05/26/2023   Left-sided weakness 04/09/2023   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) 10/26/2021   Seizure disorder (HCC) 10/26/2021   S/P craniotomy 03/09/2021   Dilation of esophagus    Hyponatremia    Dysphagia    Seizures (HCC)    S/P percutaneous endoscopic gastrostomy (PEG) tube placement (HCC)    Weakness 11/15/2020   Bowel obstruction (HCC)    S/P repair of paraesophageal hernia 10/26/2020   Syncope 10/21/2020   Hiatal hernia 10/21/2020   Acute hypoxemic respiratory failure (HCC) 10/21/2020   Sinus tachycardia 10/21/2020   Gastric polyps    Altered mental status 09/05/2020   Emesis 07/24/2015   Coffee ground emesis    Gastrointestinal hemorrhage associated with other gastritis    Acute esophagitis 01/09/2015   Upper gastrointestinal bleed    Diarrhea  Nausea with vomiting    GI bleed 01/08/2015   GIB (gastrointestinal bleeding) 01/08/2015   Nausea & vomiting 01/08/2015   Reflux esophagitis 07/04/2009   Loss of weight 07/04/2009   BLOOD IN STOOL, OCCULT 07/04/2009   ANEMIA, IRON DEFICIENCY 05/04/2008   Hypothyroidism 04/05/2008   Schizophrenia, unspecified type (HCC) 04/05/2008   Depression 04/05/2008   Hemiplegia (HCC) 04/05/2008   Essential hypertension 04/05/2008   GERD 04/05/2008   Gastroparesis 04/05/2008   Acute metabolic encephalopathy 04/05/2008   History of diverticulosis 08/21/2004   Barrett's esophagus 04/01/2002   HIATAL HERNIA 04/01/2002   PCP:  Hilma Philis HERO Pharmacy:    [facility] [phone] - [place], [place] - [address] AT [address] OF [facility] [address] [place] [address] Phone: [phone] Fax: [phone]  [facility] [address] [place] [address] Phone: [phone] Fax: [phone]     Social Drivers of Health (SDOH) Social History: SDOH Screenings   Food Insecurity: No Food Insecurity (06/05/2023)  Housing: Low Risk  (06/05/2023)  Transportation Needs: No Transportation Needs (06/05/2023)  Utilities: Not At Risk (06/05/2023)  Depression (PHQ2-9): Low Risk  (07/10/2022)  Tobacco Use: Low Risk  (07/02/2024)   SDOH Interventions:     Readmission Risk Interventions     No data to display

## 2024-07-03 NOTE — Progress Notes (Signed)
 Valproic  acid level reviewed, he is at the upper limit of normal trough at his current dose and it is reasonable to continue current dose at this time.  However given the trend over time and weight loss over time, do recommend repeat level on an outpatient basis and close outpatient neurology follow-up within 4 to 6 weeks  May need dose adjustment due to body weight changes over time   Latest Reference Range & Units 05/25/23 18:06 06/04/23 22:25 06/05/23 02:39 10/07/23 09:19 05/14/24 15:22 05/15/24 05:12 06/29/24 23:31 06/29/24 23:38 07/02/24 13:10 07/02/24 18:24  Lamotrigine , Serum 2.0 - 20.0 ug/mL 8.4  8.1 10.7 10.3   12.6    Valproic  Acid,S 50 - 100 ug/mL  49 (L)  33 (L)  88 128 (H)  133 (H) 98  (L): Data is abnormally low (H): Data is abnormally high   Discussed with primary team via secure chat.  No further inpatient neurological workup at this time, expect he should improve with dose reduction of his Seroquel   Please do not hesitate to reach out if any further neurological questions or concerns arise, neurology is signing off at this time  Lola Jernigan MD-PhD [facility] [phone] Available 7 AM to 7 PM, outside these hours please contact Neurologist on call listed on AMION   No charge note

## 2024-07-04 ENCOUNTER — Encounter (HOSPITAL_COMMUNITY): Payer: Self-pay | Admitting: Family Medicine

## 2024-07-04 DIAGNOSIS — G9341 Metabolic encephalopathy: Secondary | ICD-10-CM

## 2024-07-04 DIAGNOSIS — T43595A Adverse effect of other antipsychotics and neuroleptics, initial encounter: Secondary | ICD-10-CM | POA: Diagnosis present

## 2024-07-04 DIAGNOSIS — G40909 Epilepsy, unspecified, not intractable, without status epilepticus: Secondary | ICD-10-CM

## 2024-07-04 LAB — BASIC METABOLIC PANEL WITH GFR
Anion gap: 7 (ref 5–15)
BUN: 5 mg/dL — ABNORMAL LOW (ref 8–23)
CO2: 30 mmol/L (ref 22–32)
Calcium: 9.3 mg/dL (ref 8.9–10.3)
Chloride: 102 mmol/L (ref 98–111)
Creatinine, Ser: 1.11 mg/dL (ref 0.61–1.24)
GFR, Estimated: >60 mL/min (ref >60)
Glucose, Bld: 92 mg/dL (ref 70–99)
Potassium: 3.6 mmol/L (ref 3.5–5.1)
Sodium: 139 mmol/L (ref 135–145)

## 2024-07-04 LAB — VITAMIN B1: Vitamin B1 (Thiamine): 102.2 nmol/L (ref 66.5–200.0)

## 2024-07-04 LAB — MAGNESIUM: Magnesium: 2 mg/dL (ref 1.7–2.4)

## 2024-07-04 LAB — T4, FREE: Free T4: 1.51 ng/dL — ABNORMAL HIGH (ref 0.61–1.12)

## 2024-07-04 LAB — PHOSPHORUS: Phosphorus: 3.1 mg/dL (ref 2.5–4.6)

## 2024-07-04 MED ORDER — ONDANSETRON 4 MG PO TBDP: 4.0000 mg | ORAL_TABLET | Freq: Three times a day (TID) | ORAL | Status: DC | PRN

## 2024-07-04 MED ORDER — LEVOTHYROXINE SODIUM 75 MCG PO TABS
150.0000 ug | ORAL_TABLET | Freq: Every day | ORAL | Status: DC
  Administered 2024-07-06: 150 ug via ORAL
  Filled 2024-07-04: qty 2

## 2024-07-04 MED ORDER — ONDANSETRON HCL 4 MG/2ML IJ SOLN
4.0000 mg | Freq: Once | INTRAMUSCULAR | Status: AC
  Administered 2024-07-04: 4 mg via INTRAVENOUS
  Filled 2024-07-04: qty 2

## 2024-07-04 NOTE — Assessment & Plan Note (Addendum)
 Chronic, progressive with no clear etiology but with hx of dysphagia suspect progression of neurologic illness vs new onset dementia contributing. Does have hx of severe GERD and Barrett esophagus, increasing risk for malignancy. - Refeeding labs: Phos, BMP, Mag in process - AM labs: BMP, Phos, Mag - Dysphagia 3 diet - Strict Is and Os - Nutrition consulted and to do calorie count, appreciate recommendations - SLP consulted, appreciate recommendations - Recommending Dysphagia 2 diet - Consider GI consult for endoscopy to evaluate esophageal dysmotility

## 2024-07-04 NOTE — Plan of Care (Signed)
  Problem: Health Behavior/Discharge Planning: Goal: Ability to manage health-related needs will improve Outcome: Progressing   Problem: Clinical Measurements: Goal: Ability to maintain clinical measurements within normal limits will improve Outcome: Progressing Goal: Will remain free from infection Outcome: Progressing Goal: Diagnostic test results will improve Outcome: Progressing Goal: Respiratory complications will improve Outcome: Progressing Goal: Cardiovascular complication will be avoided Outcome: Progressing   Problem: Activity: Goal: Risk for activity intolerance will decrease Outcome: Progressing   Problem: Elimination: Goal: Will not experience complications related to bowel motility Outcome: Progressing Goal: Will not experience complications related to urinary retention Outcome: Progressing   Problem: Safety: Goal: Ability to remain free from injury will improve Outcome: Progressing   Problem: Skin Integrity: Goal: Risk for impaired skin integrity will decrease Outcome: Progressing   

## 2024-07-04 NOTE — Assessment & Plan Note (Addendum)
 Patient clinically stable.  - Depakote  of 500 mg twice daily (home dose) - Reduced Seroquel  to 200 mg twice daily; recommend continuing this dose moving forward per neurology - Labs: Thiamine  pending - TSH <0.010, free T4 pending - PT/OT eval and treat - Palliative consulted, awaiting GOC conversation with sister

## 2024-07-04 NOTE — Progress Notes (Deleted)
 Pt refused AM meds. MD notified and aware.   [name], RN

## 2024-07-04 NOTE — Assessment & Plan Note (Signed)
 HLD: Continue rosuvastatin  20 mg GERD: Continue pantoprazole  40 mg BID Hypothyroidism: Continue levothyroxine  175 mcg; TSH pending Schizophrenia: Quetiapine  as above Constipation: MiraLAX  BID and senna daily T2DM: A1c consistently <6 on checks, no recent values; HA1c pending

## 2024-07-04 NOTE — Progress Notes (Signed)
 Daily Progress Note Intern Pager: [phone]  Patient name: [name] Medical record number: [id number] Date of birth: 1956/08/06 Age: 68 y.o. Gender: male  Primary Care Provider: Hilma Philis HERO Consultants: Neurology (signed off), palliative Code Status: Full code  Pt Overview and Major Events to Date:  7/10: Admitted, neurology and palliative consulted 7/11: Neurology signed off  Medical Decision Making:  [name] is a 68 year old male admitted for encephalopathy.  Etiology of medicine burden/polypharmacy versus natural progression of neurological conditions versus new onset dementia.  Strongly suspect oversedation from medication with Seroquel  dose too high, has since been decreased with improvement in patient's mental status.  Could also be dementia onset given deteriorating picture over course of months in setting of congenital neuro abnormalities.  Pertinent PMH/PSH includes seizure disorder, schizophrenia, HTN, HLD, type 2 diabetes, hypothyroidism, dysphagia, benign brain tumor status postcraniotomy in 2004, developmental delay.  Assessment & Plan Encephalopathy Patient clinically stable.  - Depakote  of 500 mg twice daily (home dose) - Reduced Seroquel  to 200 mg twice daily; recommend continuing this dose moving forward per neurology - Labs: Thiamine  pending - TSH <0.010, free T4 pending - PT/OT eval and treat - Palliative consulted, awaiting GOC conversation with sister Malnutrition (HCC) Dysphagia Chronic, progressive with no clear etiology but with hx of dysphagia suspect progression of neurologic illness vs new onset dementia contributing. Does have hx of severe GERD and Barrett esophagus, increasing risk for malignancy. - Refeeding labs: Phos, BMP, Mag in process - AM labs: BMP, Phos, Mag - Dysphagia 3 diet - Strict Is and Os - Nutrition consulted and to do calorie count, appreciate recommendations - SLP consulted, appreciate  recommendations - Recommending Dysphagia 2 diet - Consider GI consult for endoscopy to evaluate esophageal dysmotility Chronic health problem HLD: Continue rosuvastatin  20 mg GERD: Continue pantoprazole  40 mg BID Hypothyroidism: Continue levothyroxine  175 mcg; TSH pending Schizophrenia: Quetiapine  as above Constipation: MiraLAX  BID and senna daily T2DM: A1c consistently <6 on checks, no recent values; HA1c pending   FEN/GI: Dysphagia 2 diet PPx: Lovenox  Dispo:Back to group home with outpatient therapy at [facility] (to start 7/28) pending Goals of Care conversation, further workup of malnutrition and dysphagia.  Note, [facility] group home will provide transportation when he is discharged but they are only open M-F until 5 pm.  Subjective:  Patient is very sleepy this morning.  He nods when asked if he is doing okay.  He denies any pain or concerns at this time.  Objective: Temp:  [97.4 F (36.3 C)-98.4 F (36.9 C)] 98.4 F (36.9 C) (07/11 2312) Pulse Rate:  [58-93] 58 (07/11 2312) Resp:  [16-18] 17 (07/11 2312) BP: (102-116)/(48-74) 104/65 (07/11 2312) SpO2:  [89 %-100 %] 91 % (07/11 2312) Physical Exam: General: Sleepy, patient examined at 5 AM, no acute distress Cardiovascular: RRR, no M/R/G Respiratory: CTAB anteriorly, normal work of breathing on room air Abdomen: Soft, flat, nontender, bowel sounds present Extremities: 1+ pitting edema  Laboratory: Most recent CBC Lab Results  Component Value Date   WBC 5.5 07/02/2024   HGB 12.9 (L) 07/02/2024   HCT 38.0 (L) 07/02/2024   MCV 91.4 07/02/2024   PLT 119 (L) 07/02/2024   Most recent BMP    Latest Ref Rng & Units 07/03/2024   12:10 PM  BMP  Glucose 70 - 99 mg/dL 894   BUN 8 - 23 mg/dL 8   Creatinine 9.38 - 8.75 mg/dL 9.00   Sodium 864 - 854 mmol/L 140  Potassium 3.5 - 5.1 mmol/L 3.7   Chloride 98 - 111 mmol/L 104   CO2 22 - 32 mmol/L 23   Calcium  8.9 - 10.3 mg/dL 9.4    TSH: <9.989  ulU/mL   Imaging/Diagnostic Tests: No new imaging   Gerald Raguel MATSU, DO 07/04/2024, 1:27 AM  PGY-1, [facility] FPTS Intern pager: [phone], text pages welcome Secure chat group [facility] Teaching Service

## 2024-07-05 DIAGNOSIS — G40909 Epilepsy, unspecified, not intractable, without status epilepticus: Secondary | ICD-10-CM | POA: Diagnosis not present

## 2024-07-05 DIAGNOSIS — G9341 Metabolic encephalopathy: Secondary | ICD-10-CM | POA: Diagnosis not present

## 2024-07-05 NOTE — Assessment & Plan Note (Addendum)
 HLD: Continue rosuvastatin  20 mg GERD: Continue pantoprazole  40 mg BID Hypothyroidism: TSH low, decrease levothyroxine  to 150 mcg Schizophrenia: Quetiapine  as above Constipation: MiraLAX  BID and senna daily T2DM: A1c consistently <6 on checks, no recent values; HA1c pending

## 2024-07-05 NOTE — Progress Notes (Signed)
 9:08PM - Patient complained of nausea and vomited 1x. Informed on-call Dr. CANDIE Pinal, DO. Ordered for 1x dose Zofran  IV.  9:29PM -  Zofran  IV given. No other complains. 10:26PM -  Refused all oral meds, he said I won't take it. Patient educated and explained the importance specially his seizure meds. Still patient refused. Notified Dr. CANDIE Pinal, DO. 10:36PM - Dr. CANDIE Pinal, DO came and spoke to the patient. He initially agreed with the doctor to take it but when doctor left he refused again and won't open his mouth to take the medicines. Informed again Dr. Pinal, DO regarding patient refusal.

## 2024-07-05 NOTE — Assessment & Plan Note (Signed)
 Stable - Depakote  500 mg twice daily  - Continue Seroquel  200 mg twice daily - Labs: Thiamine  pending - TSH <0.010 and FT4 elevated. Will need repeat TSH outpt ~4wks after discharge - PT/OT eval and treat - Palliative consulted, awaiting GOC conversation with sister

## 2024-07-05 NOTE — Progress Notes (Addendum)
     Daily Progress Note Intern Pager: [phone]  Patient name: [name] Medical record number: [id number] Date of birth: Oct 12, 1956 Age: 68 y.o. Gender: male  Primary Care Provider: Hilma Philis HERO Consultants: Palliative, neurology (signed off) Code Status: Full  Pt Overview and Major Events to Date:  7/10: Admitted, neurology and palliative consulted 7/11: Neurology signed off  Assessment and Plan: [name] is a 68 year old male admitted for encephalopathy 2/2 seroquel  side effect.    Pertinent PMH/PSH includes seizure disorder, schizophrenia, HTN, HLD, type 2 diabetes, hypothyroidism, dysphagia, benign brain tumor status postcraniotomy in 2004, developmental delay.  Assessment & Plan Encephalopathy Stable - Depakote  500 mg twice daily  - Continue Seroquel  200 mg twice daily - Labs: Thiamine  pending - TSH <0.010 and FT4 elevated. Will need repeat TSH outpt ~4wks after discharge - PT/OT eval and treat - Palliative consulted, awaiting GOC conversation with sister Dysphagia Chronic, progressive. Has hx of Barrets. Recent EGD neg for malignancy.  - Refeeding labs stable, can stop checking  - Strict Is and Os - Nutrition consulted, appreciate recommendations - SLP consulted, appreciate recommendations - Consider GI consult for endoscopy to evaluate esophageal dysmotility Chronic health problem HLD: Continue rosuvastatin  20 mg GERD: Continue pantoprazole  40 mg BID Hypothyroidism: TSH low, decrease levothyroxine  to 150 mcg Schizophrenia: Quetiapine  as above Constipation: MiraLAX  BID and senna daily T2DM: A1c consistently <6 on checks, no recent values; HA1c pending  FEN/GI: Dysphagia 2 PPx: Lovenox  Dispo: Pending further management of malnutrition and dysphagia.  Subjective:  No acute events overnight. Asked to have lights and tv turned off, no other complaints this morning.   Objective: Temp:  [97.6 F (36.4 C)-98.4 F (36.9 C)] 97.9 F  (36.6 C) (07/13 0350) Pulse Rate:  [61-88] 88 (07/13 0350) Resp:  [17-20] 18 (07/13 0350) BP: (97-156)/(61-81) 110/71 (07/13 0350) SpO2:  [92 %-99 %] 93 % (07/13 0350) Physical Exam: General: Alert, pleasant man.  Following directions.  NAD. HEENT: NCAT. MMM. CV: RRR, no murmurs.  Resp: CTAB, no wheezing or crackles. Normal WOB on RA.  Abm: Soft, nontender, nondistended. BS present. Skin: Warm, well perfused   Laboratory: Most recent CBC Lab Results  Component Value Date   WBC 5.5 07/02/2024   HGB 12.9 (L) 07/02/2024   HCT 38.0 (L) 07/02/2024   MCV 91.4 07/02/2024   PLT 119 (L) 07/02/2024   Most recent BMP    Latest Ref Rng & Units 07/04/2024    4:53 AM  BMP  Glucose 70 - 99 mg/dL 92   BUN 8 - 23 mg/dL 5   Creatinine 9.38 - 8.75 mg/dL 8.88   Sodium 864 - 854 mmol/L 139   Potassium 3.5 - 5.1 mmol/L 3.6   Chloride 98 - 111 mmol/L 102   CO2 22 - 32 mmol/L 30   Calcium  8.9 - 10.3 mg/dL 9.3     Elicia Hamlet, MD 07/05/2024, 4:35 AM  PGY-2, [facility] FPTS Intern pager: [phone], text pages welcome Secure chat group [facility] Teaching Service

## 2024-07-05 NOTE — Plan of Care (Signed)
  Problem: Clinical Measurements: Goal: Ability to maintain clinical measurements within normal limits will improve Outcome: Progressing Goal: Will remain free from infection Outcome: Progressing Goal: Diagnostic test results will improve Outcome: Progressing Goal: Respiratory complications will improve Outcome: Progressing Goal: Cardiovascular complication will be avoided Outcome: Progressing   Problem: Elimination: Goal: Will not experience complications related to bowel motility Outcome: Progressing Goal: Will not experience complications related to urinary retention Outcome: Progressing   Problem: Skin Integrity: Goal: Risk for impaired skin integrity will decrease Outcome: Progressing   Problem: Safety: Goal: Ability to remain free from injury will improve Outcome: Progressing

## 2024-07-05 NOTE — Discharge Instructions (Signed)
 Dear [name],  Thank you for letting us  participate in your care. You were hospitalized for confusion.  You were treated with a reduction in dose of your medications with return to baseline.  We suspect the difficulty eating [name] is having is due to his sedation and progression of his medical conditions.  POST-HOSPITAL & CARE INSTRUCTIONS You can reach out to the Palliative Medicine Team about the MOST form they discussed with you in the hospital and to continue conversations about goals of care [phone]). Please make sure to hydrate, drink at least 2 Liters (4 standard plastic water  bottles) daily Please try to take Ensure for protein and calorie supplementation twice daily between meals Your diet will be Dysphagia 2 (fine chop of foods) with normal liquid consistency You will need a repeat thyroid  lab panel in 4 weeks given that we adjusted your thyroid  medications Be sure to take all of your medications as listed in this packet. Go to your follow up appointments (listed below)  DOCTOR'S APPOINTMENT   Future Appointments  Date Time Provider Department Center  07/20/2024 11:00 AM Abelardo Burnard PARAS, OT [facility]  07/20/2024 11:45 AM Ulyses Daved NOVAK, PT [facility]  07/23/2024  4:00 PM Onita Duos, MD [facility] None  10/06/2024  8:45 AM Gayland Lauraine PARAS, NP [facility] None  12/07/2024  1:30 PM Onita Duos, MD [facility] None    Follow-up Information     Royals, Philis HERO. Schedule an appointment as soon as possible for a visit.   Specialty: Family Medicine Why: ASAP for hospital follow-up. Contact information: [address] [place] [address] [phone]                 Take care and be well!  Family Medicine Teaching Service Inpatient Team   [facility]  [address] [place], [place] [address] [phone]

## 2024-07-05 NOTE — Assessment & Plan Note (Addendum)
 Chronic, progressive. Has hx of Barrets. Recent EGD neg for malignancy.  - Refeeding labs stable, can stop checking  - Strict Is and Os - Nutrition consulted, appreciate recommendations - SLP consulted, appreciate recommendations - Consider GI consult for endoscopy to evaluate esophageal dysmotility

## 2024-07-05 NOTE — Progress Notes (Signed)
 Daily Progress Note   Patient Name: SASUKE YAFFE       Date: 07/05/2024 DOB: 03/02/1956  Age: 68 y.o. MRN#: [id number] Attending Physician: McDiarmid, Krystal BIRCH, MD Primary Care Physician: Hilma Philis HERO Admit Date: 07/02/2024  Reason for Consultation/Follow-up: Establishing goals of care  Length of Stay: 3  Current Medications: Scheduled Meds:   aspirin  EC  81 mg Oral QHS   divalproex   500 mg Oral BID   enoxaparin  (LOVENOX ) injection  40 mg Subcutaneous Q24H   feeding supplement  237 mL Oral BID BM   lamoTRIgine   100 mg Oral QHS   And   lamoTRIgine   25 mg Oral QHS   levothyroxine   150 mcg Oral Q0600   pantoprazole   40 mg Oral BID AC   polyethylene glycol  17 g Oral BID   QUEtiapine   200 mg Oral BID   rosuvastatin   20 mg Oral Daily    Continuous Infusions:  thiamine  (VITAMIN B1) injection 500 mg (07/05/24 0944)    PRN Meds: ondansetron , senna-docusate  Physical Exam Vitals reviewed.  Constitutional:      General: He is sleeping.             Vital Signs: BP (!) 102/56 (BP Location: Right Arm)   Pulse 86   Temp 97.8 F (36.6 C)   Resp 19   Wt 53 kg   SpO2 92%   BMI 17.77 kg/m  SpO2: SpO2: 92 % O2 Device: O2 Device: Room Air O2 Flow Rate:     Patient Active Problem List   Diagnosis Date Noted   Adverse effect of other antipsychotics and neuroleptics, initial encounter 07/04/2024   Malnutrition of moderate degree 07/03/2024   Encephalopathy 07/02/2024   Congenital cognitive impairment 05/15/2024   History of stroke with residual deficit 05/15/2024   AMS (altered mental status)  Weakness 05/14/2024   Chronic health problem 05/14/2024   Polypharmacy 06/26/2023   Mixed hyperlipidemia 05/26/2023   Intellectual developmental delay 05/26/2023   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) 10/26/2021   Seizure disorder (HCC) 10/26/2021   History of Barrett's esophagus 11/18/2020   History of Esophageal dysmotility 11/18/2020   History of Esophageal dilation and tortuosity on EGD 11/18/2020   Dysphagia    Hiatal hernia 10/21/2020   Emesis  07/24/2015   Weight loss, unintentional 07/04/2009   ANEMIA, IRON DEFICIENCY 05/04/2008   Hypothyroidism 04/05/2008   Schizophrenia, unspecified type (HCC) 04/05/2008   Depression 04/05/2008   Essential hypertension 04/05/2008   GERD 04/05/2008   Gastroparesis 04/05/2008   Acute metabolic encephalopathy 04/05/2008   HIATAL HERNIA 04/01/2002    Palliative Care Assessment & Plan   Patient Profile: 68 y.o. male  with past medical history of CVA with L hemiplegia, developmental delay, seizure disorder, schizophrenia, GERD with Barrett's esophagus, HTN, h/o GIB, and hypothyroid admitted on 07/02/2024 with as a code stroke for left-sided weakness and speech difficulty worse than baseline. MRI 7/10 no evidence of acute or recent infarction. There is extensive right frontal encephalomalacia and surrounding gliosis. Etiology of medicine burden/polypharmacy versus natural progression of neurological conditions versus new onset dementia.   Today's Discussion: Reviewed chart and received update from attending team.  Patient will likely discharge back to [facility] group home tomorrow.  Patient sleeping and does not easily arouse when I enter the room.  No family at bedside.  12:49 pm: Called patient's Sister Marval.  She is still out of town and will be until Wednesday.  Updated her that patient will likely discharge tomorrow.  She received the hard choices booklet by email but has not had a chance to review it.  She plans to review it and consider goals of care moving forward including CODE STATUS and intubation.    We discussed outpatient palliative care at discharge for  additional support.  Marval is interested in this and TOC order placed.    [name] asked me to go to the patient's room and call her so she can talk to the patient about the plan moving forward.  Patient was watching TV in NAD.  Patient's sister updated the patient and let him know how many days it would be before they would see each other again.  Patient became upset and sister was able to calm him down.  [name] asked for attending team to give her an update prior to discharge.  Emotional support and therapeutic listening provided.  Encouraged [name] to call PMT with any questions or concerns prior to discharge.   Recommendations/Plan: Full code Full scope Encouraged patient's sister to continue considering goals of care moving forward Outpatient palliative follow up Discharge back to [facility] group home- likely tomorrow 7/14 Peripheral PMT support    Code Status:    Code Status Orders  (From admission, onward)           Start     Ordered   07/02/24 1854  Full code  Continuous       Question:  By:  Answer:  Consent: discussion documented in EHR   07/02/24 1912        Extensive chart review has been completed prior to seeing the patient including labs, vital signs, imaging, progress/consult notes, orders, medications, and available advance directive documents.  Care plan was discussed with attending team  Time spent: 45 minutes  Thank you for allowing the Palliative Medicine Team to assist in the care of this patient.   Stephane CHRISTELLA Palin, NP  Please contact Palliative Medicine Team phone at [phone] for questions and concerns.

## 2024-07-06 ENCOUNTER — Other Ambulatory Visit (HOSPITAL_COMMUNITY): Payer: Self-pay

## 2024-07-06 DIAGNOSIS — R410 Disorientation, unspecified: Secondary | ICD-10-CM

## 2024-07-06 LAB — T3, FREE: T3, Free: 1.7 pg/mL — ABNORMAL LOW (ref 2.0–4.4)

## 2024-07-06 MED ORDER — QUETIAPINE FUMARATE 200 MG PO TABS
200.0000 mg | ORAL_TABLET | Freq: Two times a day (BID) | ORAL | 0 refills | Status: AC
  Filled 2024-07-06: qty 60, 30d supply, fill #0

## 2024-07-06 MED ORDER — LEVOTHYROXINE SODIUM 150 MCG PO TABS
150.0000 ug | ORAL_TABLET | Freq: Every day | ORAL | 0 refills | Status: DC
  Filled 2024-07-06: qty 30, 30d supply, fill #0

## 2024-07-06 MED ORDER — ENSURE PLUS HIGH PROTEIN PO LIQD: 237.0000 mL | Freq: Two times a day (BID) | ORAL | Status: DC

## 2024-07-06 NOTE — Discharge Summary (Addendum)
 Family Medicine Teaching [facility] Discharge Summary  Patient name: [name] Medical record number: [id number] Date of birth: 08-29-1956 Age: 68 y.o. Gender: male Date of Admission: 07/02/2024  Date of Discharge: 07/06/2024 Admitting Physician: Claudetta Silence, MD  Primary Care Provider: Hilma Philis HERO Consultants: Palliative, Neurology (signed off)  Indication for Hospitalization: Altered mental status   Discharge Diagnoses/Problem List:  Principal Problem for Admission: Altered mental status Other Problems addressed during stay:  Principal Problem:   Adverse effect of other antipsychotics and neuroleptics, initial encounter Active Problems:   Polypharmacy   Weight loss, unintentional   Dysphagia   Acute metabolic encephalopathy   Malnutrition of moderate degree   Seizure disorder (HCC)   Congenital cognitive impairment   Chronic health problem   Encephalopathy  Brief Hospital Course:  [name] is a 68 y.o. year old with a history of seizure disorder, schizophrenia, HTN, HLD, T2DM, hypothyroidism, dysphagia, benign brain tumor s/p craniotomy in 2004, developmental delay who presented with encephalopathy and was admitted to the [facility] Teaching Service for same in setting of oversedation from medication.  Encephalopathy Pt initially presented for AMS with increased weakness, not waking up. MRI was negative for stroke; neurology was consulted and recommended decreasing seroquel  to 200 mg twice daily. Pt improved swiftly. Was kept on Depakote  500 mg twice daily and lamotrigine  125 mg nightly. Palliative was consulted for goals of care conversation with sister; patient's sister elected to continue to follow-up with palliative outpatient.  Patient returned to baseline mental status prior to discharge.  Malnutrition Dysphagia AKI Mild AKI with Cr elevated to 1.3 noted on admission, likely due to dehydration secondary to poor PO intake in  setting of encephalopathy.  Patient with progressive, chronic dysphagia without clear etiology. Was seen by SLP and dietician during this admission, with recommendations for dysphagia 2 diet and Ensure supplements.  Other chronic conditions were medically managed with home medications and formulary alternatives as necessary (HLD, GERD, hypothyroidism, schizophrenia, constipation, T2DM).  PCP Follow-up Recommendations: Recheck Depakote  level; if high, consider reducing Depakote  dose Follow-up with neurology outpatient in 4-6 weeks Recheck thyroid  levels in 4 weeks on 8/9; synthroid  reduced this admission for low TSH and high T4 Recommend daily BID Ensure supplementation for protein and calories given recent weight loss Ensure adequate daily hydration, continue to encourage PO fluid intake Dysphagia 2 (fine chop of foods) with normal liquid consistency diet  Results/Tests Pending at Time of Discharge:  Unresulted Labs (From admission, onward)    None        Disposition: Group home   Discharge Condition: Medically stable, back to baseline per sister   Discharge Exam:  Vitals:   07/06/24 0736 07/06/24 1100  BP: 114/72 103/78  Pulse: 72 (!) 58  Resp: 18 18  Temp: 98.3 F (36.8 C) 98.7 F (37.1 C)  SpO2:  100%   General: awake, alert, NAD Cardiovascular: RRR, no m/r/g Respiratory: CTAB, normal work of breathing on room air  Abdomen: soft, flat, nontender to palpation, bowel sounds present Extremities: no lower extremity edema    Significant Labs and Imaging:  No results for input(s): WBC, HGB, HCT, PLT in the last 48 hours. No results for input(s): NA, K, CL, CO2, GLUCOSE, BUN, CREATININE, CALCIUM , MG, PHOS, ALKPHOS, AST, ALT, ALBUMIN , PROTEIN in the last 48 hours.  Pertinent Imaging   CT head without contrast Stable chronic encephalomalacia changes within the right frontal lobe. No apparent acute process.  MRI Brain without  contrast Limited study  demonstrating no evidence of acute or recent infarction. There is extensive right frontal encephalomalacia and surrounding gliosis.  Chest x-ray Low lung volumes, causing crowding of the pulmonary vasculature  Degenerative glenohumeral arthropathy bilaterally   Discharge Medications:  Allergies as of 07/06/2024       Reactions   Vancomycin Itching, Other (See Comments)   Unknown-possible hives        Medication List     PAUSE taking these medications    benazepril 5 MG tablet Wait to take this until your doctor or other care provider tells you to start again. Commonly known as: LOTENSIN Take 5 mg by mouth every evening.   metoprolol  tartrate 25 MG tablet Wait to take this until your doctor or other care provider tells you to start again. Commonly known as: LOPRESSOR  Take 0.5 tablets (12.5 mg total) by mouth 2 (two) times daily.       STOP taking these medications    promethazine 25 MG suppository Commonly known as: PHENERGAN       TAKE these medications    aspirin  EC 81 MG tablet Take 1 tablet (81 mg total) by mouth daily. Swallow whole. What changed: when to take this   bisacodyl  10 MG suppository Commonly known as: DULCOLAX Place 10 mg rectally daily as needed for moderate constipation.   divalproex  500 MG DR tablet Commonly known as: DEPAKOTE  Take 1 tablet (500 mg total) by mouth every 12 (twelve) hours.   docusate sodium  100 MG capsule Commonly known as: COLACE Take 1 capsule (100 mg total) by mouth 2 (two) times daily.   feeding supplement Liqd Take 237 mLs by mouth 2 (two) times daily between meals.   hydrocortisone  cream 1 % Apply 1 Application topically daily as needed for itching (Bug bites).   lamoTRIgine  25 MG tablet Commonly known as: LaMICtal  Take 5 tablets (125 mg total) by mouth at bedtime.   levothyroxine  150 MCG tablet Commonly known as: SYNTHROID  Take 1 tablet (150 mcg total) by mouth daily at 6 (six)  AM. Start taking on: July 07, 2024 What changed:  medication strength how much to take when to take this   MILK OF MAGNESIA PO Take 30 mLs by mouth daily as needed (bowel movement).   ondansetron  4 MG tablet Commonly known as: ZOFRAN  Take 4 mg by mouth every 8 (eight) hours as needed for nausea or vomiting.   pantoprazole  40 MG tablet Commonly known as: PROTONIX  Take 1 tablet (40 mg total) by mouth 2 (two) times daily before a meal.   polyethylene glycol 17 g packet Commonly known as: MIRALAX  / GLYCOLAX  Take 17 g by mouth 2 (two) times daily.   PRESCRIPTION MEDICATION Apply 1 Application topically 2 (two) times daily. Medication: Corn Huskers Lotion.  Apply to feet   QUEtiapine  200 MG tablet Commonly known as: SEROQUEL  Take 1 tablet (200 mg total) by mouth 2 (two) times daily. What changed:  medication strength how much to take   rosuvastatin  20 MG tablet Commonly known as: CRESTOR  Take 1 tablet (20 mg total) by mouth daily.        Discharge Instructions: Please refer to Patient Instructions section of EMR for full details.  Patient was counseled important signs and symptoms that should prompt return to medical care, changes in medications, dietary instructions, activity restrictions, and follow up appointments.   Follow-Up Appointments:  Follow-up Information     Royals, Philis HERO. Schedule an appointment as soon as possible for a visit.   Specialty: Family  Medicine Why: ASAP for hospital follow-up. Contact information: [address] [place] [address] [phone]                 Raguel KANDICE Lee, DO 07/06/2024, 1:11 PM PGY-1, [facility]   FMTS Upper-Level Resident Attestation I have independently interviewed and examined the patient. I have discussed the above with Dr. Lee and agree with the documented plan. My edits for correction/addition/clarification are included above. Please see any attending notes.  Zubair Lofton  Toma, MD PGY-2, [facility] 07/06/2024 1:13 PM FPTS Service pager: [phone] (text pages welcome through [facility])

## 2024-07-06 NOTE — Assessment & Plan Note (Signed)
 HLD: Continue rosuvastatin  20 mg GERD: Continue pantoprazole  40 mg BID Hypothyroidism: TSH low, decrease levothyroxine  to 150 mcg Schizophrenia: Quetiapine  as above Constipation: MiraLAX  BID and senna daily T2DM: A1c consistently <6 on checks, no recent values; HA1c pending

## 2024-07-06 NOTE — TOC Transition Note (Signed)
 Transition of Care [facility]) - Discharge Note   Patient Details  Name: [name] MRN: [id number] Date of Birth: 1956/11/18  [facility] CM/SW Contact:  Andrez JULIANNA George, RN Phone Number: 07/06/2024, 3:13 PM   Clinical Narrative:     Pt is returning to his group home. Group home is providing transportation. His guardian/ POA is aware and in agreement. Pt will do outpt therapy with [facility]. Information on the AVS.   Final next level of care: Group Home Barriers to Discharge: No Barriers Identified   Patient Goals and CMS Choice   CMS Medicare.gov Compare Post Acute Care list provided to:: Patient Represenative (must comment) Choice offered to / list presented to : Sibling (DSS)      Discharge Placement                       Discharge Plan and Services Additional resources added to the After Visit Summary for     Discharge Planning Services: CM Consult Post Acute Care Choice:  (outpatient at Group home)                               Social Drivers of Health (SDOH) Interventions SDOH Screenings   Food Insecurity: No Food Insecurity (06/05/2023)  Housing: Low Risk  (06/05/2023)  Transportation Needs: No Transportation Needs (06/05/2023)  Utilities: Not At Risk (06/05/2023)  Depression (PHQ2-9): Low Risk  (07/10/2022)  Tobacco Use: Low Risk  (07/02/2024)     Readmission Risk Interventions     No data to display

## 2024-07-06 NOTE — Assessment & Plan Note (Deleted)
 Stable - Depakote  500 mg twice daily  - Continue Seroquel  200 mg twice daily - Labs: Thiamine  pending - TSH <0.010 and FT4 elevated. Will need repeat TSH outpt ~4wks after discharge - PT/OT eval and treat - Sister contacted about discharge this morning

## 2024-07-06 NOTE — Progress Notes (Incomplete)
     Daily Progress Note Intern Pager: [phone]  Patient name: [name] Medical record number: [id number] Date of birth: [birth year] Age: 68 y.o. Gender: male  Primary Care Provider: Hilma Philis HERO Consultants: Palliative, neurology (signed off) Code Status: Full  Pt Overview and Major Events to Date:  7/10: Admitted, neurology and palliative consulted 7/11: Neurology signed off  Medical Decision Making:  [name] is a 68 y.o. male admitted for encephalopathy 2/2 Seroquel  side effect. Pertinent PMH/PSH includes seizure disorder, schizophrenia, HTN, HLD, T2DM, hypothyroidism, dysphagia, benign brain tumor s/p craniotomy in 2204, developmental delay.  Assessment & Plan Encephalopathy Stable - Depakote  500 mg twice daily  - Continue Seroquel  200 mg twice daily - Labs: Thiamine  pending - TSH <0.010 and FT4 elevated. Will need repeat TSH outpt ~4wks after discharge - PT/OT eval and treat - Sister contacted about discharge this morning Dysphagia Chronic, progressive. Has hx of Barrets. Recent EGD neg for malignancy.  - Refeeding labs stable, can stop checking  - Strict Is and Os - Nutrition consulted, appreciate recommendations - SLP consulted, appreciate recommendations - Consider GI consult for endoscopy to evaluate esophageal dysmotility Chronic health problem HLD: Continue rosuvastatin  20 mg GERD: Continue pantoprazole  40 mg BID Hypothyroidism: TSH low, decrease levothyroxine  to 150 mcg Schizophrenia: Quetiapine  as above Constipation: MiraLAX  BID and senna daily T2DM: A1c consistently <6 on checks, no recent values; HA1c pending     FEN/GI: Dysphagia 2 PPx: Lovenox  Dispo:Group home today.   Subjective:  Patient seen lying comfortably in bed.  States he is doing well this morning.  He has no pain or concerns at this time.  Objective: Temp:  [97.7 F (36.5 C)-99.1 F (37.3 C)] 98.3 F (36.8 C) (07/14 0736) Pulse Rate:  [72-93] 72 (07/14  0736) Resp:  [15-20] 18 (07/14 0736) BP: (95-124)/(56-74) 114/72 (07/14 0736) SpO2:  [91 %-98 %] 98 % (07/14 0336) Physical Exam: General: awake, alert, NAD Cardiovascular: RRR, no m/r/g Respiratory: CTAB, normal work of breathing on room air Abdomen: Soft, flat, nontender to palpation, bowel sounds present Extremities: No lower extremity edema  Laboratory: Most recent CBC Lab Results  Component Value Date   WBC 5.5 07/02/2024   HGB 12.9 (L) 07/02/2024   HCT 38.0 (L) 07/02/2024   MCV 91.4 07/02/2024   PLT 119 (L) 07/02/2024   Most recent BMP    Latest Ref Rng & Units 07/04/2024    4:53 AM  BMP  Glucose 70 - 99 mg/dL 92   BUN 8 - 23 mg/dL 5   Creatinine 9.38 - 8.75 mg/dL 8.88   Sodium 864 - 854 mmol/L 139   Potassium 3.5 - 5.1 mmol/L 3.6   Chloride 98 - 111 mmol/L 102   CO2 22 - 32 mmol/L 30   Calcium  8.9 - 10.3 mg/dL 9.3     Imaging/Diagnostic Tests: No new imaging   Lennie Raguel MATSU, DO 07/06/2024, 7:37 AM  PGY-1,  Family Medicine FPTS Intern pager: [phone], text pages welcome Secure chat group [facility] Teaching Service

## 2024-07-06 NOTE — Assessment & Plan Note (Signed)
 Chronic, progressive. Has hx of Barrets. Recent EGD neg for malignancy.  - Refeeding labs stable, can stop checking  - Strict Is and Os - Nutrition consulted, appreciate recommendations - SLP consulted, appreciate recommendations - Consider GI consult for endoscopy to evaluate esophageal dysmotility

## 2024-07-06 NOTE — Progress Notes (Signed)
 Pt is stable, leaving with the group home transport. All lines out and belongings returned.

## 2024-07-07 LAB — CULTURE, BLOOD (ROUTINE X 2)
Culture: NO GROWTH
Culture: NO GROWTH
Special Requests: ADEQUATE

## 2024-07-13 ENCOUNTER — Observation Stay (HOSPITAL_COMMUNITY)
Admission: EM | Admit: 2024-07-13 | Discharge: 2024-07-22 | Disposition: A | Discharge status: Skilled Nursing Facility | Attending: Emergency Medicine | Admitting: Emergency Medicine

## 2024-07-13 DIAGNOSIS — Z66 Do not resuscitate: Secondary | ICD-10-CM | POA: Diagnosis not present

## 2024-07-13 DIAGNOSIS — K219 Gastro-esophageal reflux disease without esophagitis: Secondary | ICD-10-CM | POA: Diagnosis not present

## 2024-07-13 DIAGNOSIS — Z7982 Long term (current) use of aspirin: Secondary | ICD-10-CM | POA: Diagnosis not present

## 2024-07-13 DIAGNOSIS — G40909 Epilepsy, unspecified, not intractable, without status epilepticus: Secondary | ICD-10-CM | POA: Insufficient documentation

## 2024-07-13 DIAGNOSIS — J988 Other specified respiratory disorders: Secondary | ICD-10-CM | POA: Diagnosis not present

## 2024-07-13 DIAGNOSIS — Z7189 Other specified counseling: Secondary | ICD-10-CM

## 2024-07-13 DIAGNOSIS — R1310 Dysphagia, unspecified: Secondary | ICD-10-CM | POA: Diagnosis not present

## 2024-07-13 DIAGNOSIS — R638 Other symptoms and signs concerning food and fluid intake: Secondary | ICD-10-CM | POA: Diagnosis present

## 2024-07-13 DIAGNOSIS — N179 Acute kidney failure, unspecified: Principal | ICD-10-CM

## 2024-07-13 DIAGNOSIS — R627 Adult failure to thrive: Principal | ICD-10-CM

## 2024-07-13 DIAGNOSIS — K5900 Constipation, unspecified: Secondary | ICD-10-CM | POA: Insufficient documentation

## 2024-07-13 DIAGNOSIS — Z789 Other specified health status: Secondary | ICD-10-CM

## 2024-07-13 DIAGNOSIS — Z79899 Other long term (current) drug therapy: Secondary | ICD-10-CM | POA: Diagnosis not present

## 2024-07-13 DIAGNOSIS — E039 Hypothyroidism, unspecified: Secondary | ICD-10-CM | POA: Diagnosis not present

## 2024-07-13 DIAGNOSIS — Z515 Encounter for palliative care: Secondary | ICD-10-CM | POA: Diagnosis not present

## 2024-07-13 LAB — CBC WITH DIFFERENTIAL/PLATELET
Abs Immature Granulocytes: 0.02 K/uL (ref 0.00–0.07)
Basophils Absolute: 0 K/uL (ref 0.0–0.1)
Basophils Relative: 1 %
Eosinophils Absolute: 0.1 K/uL (ref 0.0–0.5)
Eosinophils Relative: 1 %
HCT: 33.5 % — ABNORMAL LOW (ref 39.0–52.0)
Hemoglobin: 11.2 g/dL — ABNORMAL LOW (ref 13.0–17.0)
Immature Granulocytes: 0 %
Lymphocytes Relative: 30 %
Lymphs Abs: 1.4 K/uL (ref 0.7–4.0)
MCH: 32 pg (ref 26.0–34.0)
MCHC: 33.4 g/dL (ref 30.0–36.0)
MCV: 95.7 fL (ref 80.0–100.0)
Monocytes Absolute: 0.7 K/uL (ref 0.1–1.0)
Monocytes Relative: 14 %
Neutro Abs: 2.5 K/uL (ref 1.7–7.7)
Neutrophils Relative %: 54 %
Platelets: 178 K/uL (ref 150–400)
RBC: 3.5 MIL/uL — ABNORMAL LOW (ref 4.22–5.81)
RDW: 13.1 % (ref 11.5–15.5)
WBC: 4.7 K/uL (ref 4.0–10.5)
nRBC: 0 % (ref 0.0–0.2)

## 2024-07-13 LAB — COMPREHENSIVE METABOLIC PANEL WITH GFR
ALT: 18 U/L (ref 0–44)
AST: 45 U/L — ABNORMAL HIGH (ref 15–41)
Albumin: 3.2 g/dL — ABNORMAL LOW (ref 3.5–5.0)
Alkaline Phosphatase: 69 U/L (ref 38–126)
Anion gap: 19 — ABNORMAL HIGH (ref 5–15)
BUN: 35 mg/dL — ABNORMAL HIGH (ref 8–23)
CO2: 19 mmol/L — ABNORMAL LOW (ref 22–32)
Calcium: 9.5 mg/dL (ref 8.9–10.3)
Chloride: 100 mmol/L (ref 98–111)
Creatinine, Ser: 1.61 mg/dL — ABNORMAL HIGH (ref 0.61–1.24)
GFR, Estimated: 47 mL/min — ABNORMAL LOW (ref >60)
Glucose, Bld: 102 mg/dL — ABNORMAL HIGH (ref 70–99)
Potassium: 4.3 mmol/L (ref 3.5–5.1)
Sodium: 138 mmol/L (ref 135–145)
Total Bilirubin: 1.5 mg/dL — ABNORMAL HIGH (ref 0.0–1.2)
Total Protein: 7.2 g/dL (ref 6.5–8.1)

## 2024-07-13 MED ORDER — LACTATED RINGERS IV BOLUS
1000.0000 mL | Freq: Once | INTRAVENOUS | Status: AC
  Administered 2024-07-14: 1000 mL via INTRAVENOUS

## 2024-07-13 NOTE — H&P (Addendum)
 Hospital Admission History and Physical Service Pager: [phone]  Patient name: [name] Medical record number: [id number] Date of Birth: 1956/07/23 Age: 68 y.o. Gender: male  Primary Care Provider: Dr. Hilma Consultants: Palliative care Code Status: DNR, which was confirmed with family if patient unable to confirm Preferred Emergency Contact:  Contact Information     Name Relation Home Work [place] [phone] Sister [phone]     Blount,Brittany Other   [phone]   Lorence Dene Silk   [phone]   home,group Other [phone]        Other Contacts   None on File     Chief Complaint: Poor PO intake  Differential and Medical Decision Making:  [name] is a 68 y.o. male with PMHx developmental delay 2/2 prior craniotomy, schizophrenia, HTN, HLD, IDA and seizure disorder presenting with poor po intake x 3 weeks, including prior admission for the same in the setting of likely failure to thrive.  Differential for this patient's presentation includes: Progressive cognitive decline: Likely in the setting of underlying craniotomy and developmental delay with progressive dementia.  Now forgetting basic ADLs, likely reaching end stage given inability to take PO.  POA desires transition with palliative care and does not want full workup inpatient. Infectious etiology (UTI, meningitis, encephalitis, bacteremia) unlikely given stable vitals and lack of clinical signs, symptoms or significant lab workup.  3.   Secondary neurological cause, including stroke, mass, or seizure/post ictal state also deemed unlikely given lack of typical clinical presentation, progressive nature of his decline, and the negative workup for the above during prior admission.  Assessment & Plan Failure to thrive in adult Progressive decline with inadequate p.o. intake to sustain life, POA desires transition with palliative care.  Will hold on all additional workup and  consults pending further discussion and anticipate likely transition to comfort care. -Admitted to FMTS service, Dr. Madelon attending -Palliative care consult -Continue dysphagia 2 diet -Continue home meds pending goals of care conversation w/ family 7/22 AM.  AKI (acute kidney injury) (HCC) Likely prerenal in the setting of decreased p.o. intake as above. SCr 1.61 with baseline SCr 0.9-1.1, s/p LR 1L bolus in the ED.  Likely to go comfort care as above, will hold on further IV hydration and nutrition pending goals of care conversation.  Chronic and Stable Conditions: Seizure/mood disorder: Continue Depakote  500 mg twice daily, lamotrigine  125 mg daily, continue quetiapine  200 mg twice daily Hypothyroidism: Continue levothyroxine  150 mcg daily GERD: Continue pantoprazole  40 mg Constipation: Colace 100 mg twice daily, MiraLAX  twice daily  FEN/GI: Dysphagia 2 VTE Prophylaxis: Lovenox   Disposition: Med surg  History of Present Illness:  [name] is a 68 y.o. male presenting with decreased p.o. and failure to thrive.  Majority of history provided by caregiver Raschida at bedside.  She reports he has had difficulty eating since 7/4 and has had slow progressive decline since that time.  She reports they are consistently trying to feed him but he is not interested and even holds food in his mouth.  She reports it has slowly progressed where he ate half a meal and then a quarter of a meal and only takes 4-5 bites at a time.  She denies any symptoms or pain around eating.  Has been eating pured and cut food since discharge.  She reports he is a full assist with everything and cannot walk without assistance.  She does report about a month ago he was still walking but  this again has slowly declined.  Does not remember an inciting incidents that prompted decline.  Reports he also has not forgotten how to even put in his dentures and other basic tasks.  Reports his cognition waxes and wanes  and he is mostly oriented to self at baseline.  Reports he can be agitated at times but is usually cooperative.  Additional information provided by patient's POA and Sister Marval: She reports this admission is primarily to establish patient with palliative care and focus more on comfort measures.  She does report he is a DNR/DNI.  Patient minimally interactive in conversation.  Denies fever, or sick symptoms. Denies SOB and chest pain. Denies changes in BMs. Some urinary frequency and incontinence, this is chronic per caregiver. Denies any pain currently.   In the ED, patient found to have AKI with SCr 1.61 with baseline SCr 0.9-1.1.  Received LR 1L bolus and paged for admission.  Review Of Systems: Per HPI  Pertinent Past Medical History: - HTN - Schizophrenia - HLD - Barrett esophagus - Dysphagia - IDA - Seizure disorder Remainder reviewed in history tab.   Pertinent Past Surgical History: - Craniotomy early 2000s  Remainder reviewed in history tab.  Pertinent Social History: Tobacco use: None Alcohol  use: None Other Substance use: None Lives at group home, has his own room and 24/7 caregivers.  Pertinent Family History: - No Hx seizures - Breast cancer in mother - MI in father - [place] with craniotomies for similar conditions Remainder reviewed in history tab.   Important Outpatient Medications: *Taken all meds (has not missed doses), but did not get evening meds today  ASA 81 mg daily Depakote  500 mg twice daily Colace 100 mg twice daily  Feeding supplement twice daily Lamotrigine  125 mg daily  Levothyroxine  150 mcg daily  Pantoprazole  40 mg twice daily MiraLAX  twice daily Quetiapine  200 mg twice daily Rosuvastatin  20 mg daily Remainder reviewed in medication history.   Objective: BP 118/64 (BP Location: Right Arm)   Pulse 87   Temp 98.2 F (36.8 C) (Oral)   Resp (!) 21   SpO2 98%  Exam: General: Awake, alert, NAD.  Eyes: Pupils equal and reactive to  light ENTM: NCAT, dentures in place  Cardiovascular: RRR, no m/r/g Respiratory: CTA b/l, normal wob on room air Gastrointestinal: Soft non tender non distended Neuro: Alert and oriented to self and place only. CN II-XII intact w/ exception of absent shoulder shrug on the L. Sensation intact and symmetrical bilaterally. Moves all extremities w/ 5/5 strength in RUE, 4-/5 strength in b/l LE, and 3/5 strength in L UE w/ wrist down contracture noted. Wearing AFO on R foot/ankle.   Labs:  CBC BMET  Recent Labs  Lab 07/13/24 2016  WBC 4.7  HGB 11.2*  HCT 33.5*  PLT 178   Recent Labs  Lab 07/13/24 1800  NA 138  K 4.3  CL 100  CO2 19*  BUN 35*  CREATININE 1.61*  GLUCOSE 102*  CALCIUM  9.5     T Bili 1.5 AST 45  ALT 18  EKG: NSR  Imaging Studies Performed: None in the past 24 hours   Manon Jester, DO 07/13/2024, 11:58 PM PGY-1, [facility] Family Medicine  FPTS Intern pager: [phone], text pages welcome Secure chat group [facility] Teaching Service   Upper Level Addendum:   I have seen and evaluated this patient along with Dr. Manon and reviewed the above note, making necessary revisions as appropriate.  I agree with  the medical decision making and physical exam as noted above.   Izetta Nap, DO PGY-3, [facility] Family Medicine Residency

## 2024-07-13 NOTE — ED Notes (Addendum)
 Pt has been requesting food and water . Per MD Dombroski pt may have something to eat and drink. Pt tolerating drink.

## 2024-07-13 NOTE — ED Notes (Signed)
 Pt on puree diet only per family

## 2024-07-13 NOTE — ED Triage Notes (Signed)
 Pt BIB [facility] from group home for poor PO intake x 1 week.  Pt has been in and out of hospital for lethargy and tremors.   Oriented to self at baseline. Can express how he feels.  Sister and caretaker are coming.   108 palp, HR 72, RR 18 CBG 125 96% RA

## 2024-07-13 NOTE — ED Notes (Signed)
 Pt sister has arrived to ED lobby. Lobby nursing staff has explained triage process to pt's family and family will wait in the lobby until pt has finished triage.

## 2024-07-13 NOTE — ED Provider Notes (Signed)
 [place] EMERGENCY DEPARTMENT AT [facility] Provider Note   CSN: [id number] Arrival date & time: 07/13/24  1559     History Chief Complaint  Patient presents with   Poor PO intake    HPI [name] is a 68 y.o. male presenting for chief complaint of failure to thrive. Recently admitted for AMS. Caretaker states that he has not been eating since the last time he was discharge he is still losing wait and not participating in PO. Family states that they cannot get him to eat anything at all at home  Patient's recorded medical, surgical, social, medication list and allergies were reviewed in the Snapshot window as part of the initial history.   Review of Systems   Review of Systems  Constitutional:  Positive for fatigue. Negative for chills and fever.  HENT:  Negative for ear pain and sore throat.   Eyes:  Negative for pain and visual disturbance.  Respiratory:  Negative for cough and shortness of breath.   Cardiovascular:  Negative for chest pain and palpitations.  Gastrointestinal:  Negative for abdominal pain and vomiting.  Genitourinary:  Negative for dysuria and hematuria.  Musculoskeletal:  Negative for arthralgias and back pain.  Skin:  Negative for color change and rash.  Neurological:  Negative for seizures and syncope.  All other systems reviewed and are negative.   Physical Exam Updated Vital Signs BP 118/64 (BP Location: Right Arm)   Pulse 87   Temp 98.2 F (36.8 C) (Oral)   Resp (!) 21   SpO2 98%  Physical Exam Vitals and nursing note reviewed.  Constitutional:      General: He is not in acute distress.    Appearance: He is well-developed.  HENT:     Head: Normocephalic and atraumatic.  Eyes:     Conjunctiva/sclera: Conjunctivae normal.  Cardiovascular:     Rate and Rhythm: Normal rate and regular rhythm.     Heart sounds: No murmur heard. Pulmonary:     Effort: Pulmonary effort is normal. No respiratory distress.     Breath  sounds: Normal breath sounds.  Abdominal:     Palpations: Abdomen is soft.     Tenderness: There is no abdominal tenderness.  Musculoskeletal:        General: No swelling.     Cervical back: Neck supple.  Skin:    General: Skin is warm and dry.     Capillary Refill: Capillary refill takes less than 2 seconds.  Neurological:     Mental Status: He is alert.  Psychiatric:        Mood and Affect: Mood normal.      ED Course/ Medical Decision Making/ A&P    Procedures Procedures   Medications Ordered in ED Medications  lactated ringers  bolus 1,000 mL (has no administration in time range)   Medical Decision Making:   [name] is a 68 y.o. male who presented to the ED today with altered mental status detailed above.    Patient placed on continuous vitals and telemetry monitoring while in ED which was reviewed periodically.  Complete initial physical exam performed, notably the patient  was HDS in NAD.    Reviewed and confirmed nursing documentation for past medical history, family history, social history.    Initial Assessment:   With the patient's presentation of altered mental status, most likely diagnosis is delerium 2/2 infectious etiology (UTI/CAP/URI) vs metabolic abnormality (Na/K/Mg/Ca) vs nonspecific etiology. Other diagnoses were considered including (but not limited  to) CVA, ICH, intracranial mass, critical dehydration, heptatic dysfunction, uremia, hypercarbia, intoxication, endrocrine abnormality, toxidrome. These are considered less likely due to history of present illness and physical exam findings.   This is most consistent with an acute life/limb threatening illness complicated by underlying chronic conditions.  Initial Plan:  Screening labs including CBC and Metabolic panel to evaluate for infectious or metabolic etiology of disease.  Urinalysis with reflex culture ordered to evaluate for UTI or relevant urologic/nephrologic pathology.  Objective  evaluation as below reviewed   Initial Study Results:   Laboratory  CR to BUN increased  EKG EKG was reviewed independently. Rate, rhythm, axis, intervals all examined and without medically relevant abnormality. ST segments without concerns for elevations.    Radiology:  All images reviewed independently. Agree with radiology report at this time.   MR BRAIN WO CONTRAST Result Date: 07/02/2024 CLINICAL DATA:  Neuro deficit, acute, stroke suspected EXAM: MRI HEAD WITHOUT CONTRAST TECHNIQUE: Axial diffusion and FLAIR FLAIR sequences of the brain and surrounding structures were obtained without intravenous contrast. The patient was unable to tolerate additional imaging. COMPARISON:  CT of the head dated July 02, 2024 and MRI of the head dated May 14, 2024. FINDINGS: Brain: There is no restricted diffusion demonstrated to indicate acute or recent infarction. There are extensive encephalomalacia changes present within the right frontal lobe and there is extensive surrounding gliosis within the right frontal and parietal lobes. There is ex vacuo dilatation of the right lateral ventricle. The ventricles are prominent in proportion to the cerebral sulci. No evidence of mass or hemorrhage. Vascular: Arterial flow voids appear to be present. Skull and upper cervical spine: Limited secondary to motion, but grossly unremarkable. Sinuses/Orbits: Distorted.  No obvious abnormality. Other: Limited study. IMPRESSION: 1. Limited study demonstrating no evidence of acute or recent infarction. There is extensive right frontal encephalomalacia and surrounding gliosis. Electronically Signed   By: Evalene Coho M.D.   On: 07/02/2024 14:25   DG Chest Portable 1 View Result Date: 07/02/2024 CLINICAL DATA:  Hypotension EXAM: PORTABLE CHEST 1 VIEW COMPARISON:  05/25/2023 FINDINGS: Degenerative glenohumeral arthropathy bilaterally. Low lung volumes are present, causing crowding of the pulmonary vasculature. The lungs appear  clear. No blunting of the costophrenic angles. Heart size within normal limits. IMPRESSION: 1. Low lung volumes, causing crowding of the pulmonary vasculature. 2. Degenerative glenohumeral arthropathy bilaterally. Electronically Signed   By: Ryan Salvage M.D.   On: 07/02/2024 13:25   CT HEAD CODE STROKE WO CONTRAST Result Date: 07/02/2024 CLINICAL DATA:  Code stroke.  Acute neurological deficit. EXAM: CT HEAD WITHOUT CONTRAST TECHNIQUE: Contiguous axial images were obtained from the base of the skull through the vertex without intravenous contrast. RADIATION DOSE REDUCTION: This exam was performed according to the departmental dose-optimization program which includes automated exposure control, adjustment of the mA and/or kV according to patient size and/or use of iterative reconstruction technique. COMPARISON:  CT the head dated June 29, 2024. FINDINGS: Brain: There are extensive encephalomalacia changes again demonstrated within the right frontal lobe in the distribution of breast in the middle cerebral artery and anterior cerebral artery. There also encephalomalacia changes within the right internal and external capsules and there is ex vacuo dilatation of the anterior horn of the right lateral ventricle. There is no significant change from the previous study. No evidence of hemorrhage, mass or hydrocephalus. Vascular: Mild atheromatous calcification. Skull: Status post frontal craniotomy.  No osseous lesions present. Sinuses/Orbits: Clear paranasal sinuses.  Normal orbits. Other: None. IMPRESSION: 1.  Stable chronic encephalomalacia changes within the right frontal lobe. No apparent acute process. 2. ASPECTS is 10. 3. These results were communicated via the amion paging network at time of interpretation on 07/02/2024 at 12:03 pm to provider Dr. Clayburn. Electronically Signed   By: Evalene Coho M.D.   On: 07/02/2024 12:08   CT Head Wo Contrast Result Date: 06/29/2024 CLINICAL DATA:  Mental status  change, unknown cause EXAM: CT HEAD WITHOUT CONTRAST TECHNIQUE: Contiguous axial images were obtained from the base of the skull through the vertex without intravenous contrast. RADIATION DOSE REDUCTION: This exam was performed according to the departmental dose-optimization program which includes automated exposure control, adjustment of the mA and/or kV according to patient size and/or use of iterative reconstruction technique. COMPARISON:  CT head May 14, 2024. FINDINGS: Brain: No evidence of acute infarction, hemorrhage, hydrocephalus, extra-axial collection or mass lesion/mass effect. Right frontal encephalomalacia. Vascular: No hyperdense vessel.  Calcific atherosclerosis. Skull: No acute fracture. Sinuses/Orbits: Sinuses.  No acute orbital findings. Other: No mastoid effusions. IMPRESSION: No evidence of acute intracranial abnormality. Right frontal encephalomalacia. Electronically Signed   By: Gilmore GORMAN Molt M.D.   On: 06/29/2024 23:52   Final Assessment and Plan:   Very poor PO intake. Refusing PO liquids per family. AKI appears prerenal.  Additionally family was told that palliative care was declined at the facility he is. Disposition:   Based on the above findings, I believe this patient is stable for admission.    Patient/family educated about specific findings on our evaluation and explained exact reasons for admission.  Patient/family educated about clinical situation and time was allowed to answer questions.   Admission team communicated with and agreed with need for admission. Patient admitted. Patient ready to move at this time.      Emergency Department Medication Summary:   Medications  lactated ringers  bolus 1,000 mL (has no administration in time range)        Clinical Impression:  1. AKI (acute kidney injury) (HCC)      Data Unavailable   Final Clinical Impression(s) / ED Diagnoses Final diagnoses:  AKI (acute kidney injury) [facility])    Rx / DC Orders ED  Discharge Orders     None         Jerral Meth, MD 07/13/24 2349

## 2024-07-13 NOTE — ED Notes (Signed)
 Multiple attempts made for blood by NT and Phleb.  Small amt of blood sent in light green tube to lab.  More blood will need to be collected on arrival to room.

## 2024-07-13 NOTE — ED Notes (Addendum)
 SABRA

## 2024-07-13 NOTE — H&P (Incomplete)
     Hospital Admission History and Physical Service Pager: [phone]  Patient name: [name] Medical record number: [id number] Date of Birth: May 15, 1956 Age: 68 y.o. Gender: male  Primary Care Provider: *** Consultants: *** Code Status: *** which was confirmed with family if patient unable to confirm ***  Preferred Emergency Contact:  Contact Information     Name Relation Home Work [place] [phone] Sister [phone]     Blount,Brittany Other   [phone]   Lorence Dene Silk   [phone]   home,group Other [phone]        Other Contacts   None on File      Chief Complaint: ***  Differential and Medical Decision Making:  VONN SLIGER is a 68 y.o. male presenting with *** . Differential for this patient's presentation of this includes ***, ***, and ***.  (***describe here why less likely than primary diagnosis-delete this once complete***)  Assessment & Plan    Chronic and Stable Conditions: ***  FEN/GI: *** VTE Prophylaxis: ***  Disposition: ***  History of Present Illness:  [name] is a 68 y.o. male presenting with ***    In the ED, ***  Review Of Systems: Per HPI with the following additions: ***  Pertinent Past Medical History: *** (*** review and detail significant medical history here***) Remainder reviewed in history tab.   Pertinent Past Surgical History: ***  Remainder reviewed in history tab.  Pertinent Social History: Tobacco use: Yes/No/Former Alcohol  use: *** Other Substance use: *** Lives with ***  Pertinent Family History: ***  Remainder reviewed in history tab.   Important Outpatient Medications: *** Remainder reviewed in medication history.   Objective: BP 118/64 (BP Location: Right Arm)   Pulse 87   Temp 98.2 F (36.8 C) (Oral)   Resp (!) 21   SpO2 98%  Exam: General: *** Eyes: *** ENTM: *** Neck: *** Cardiovascular: *** Respiratory: *** Gastrointestinal:  *** MSK: *** Derm: *** Neuro: *** Psych: ***  Labs:  CBC BMET  Recent Labs  Lab 07/13/24 2016  WBC 4.7  HGB 11.2*  HCT 33.5*  PLT 178   Recent Labs  Lab 07/13/24 1800  NA 138  K 4.3  CL 100  CO2 19*  BUN 35*  CREATININE 1.61*  GLUCOSE 102*  CALCIUM  9.5    Pertinent additional labs ***.  EKG: My own interpretation (not copied from electronic read) ***    Imaging Studies Performed:  Imaging Study (ie. Chest x-ray) Impression from Radiologist: ***   My Interpretation: Gerald Manon Jester, DO 07/13/2024, 11:58 PM PGY-1, [facility] Family Medicine  FPTS Intern pager: [phone], text pages welcome Secure chat group [facility] Teaching Service

## 2024-07-13 NOTE — ED Notes (Signed)
 [place] sandwich and soda provided per pt request and provider approval

## 2024-07-14 ENCOUNTER — Other Ambulatory Visit: Payer: Self-pay

## 2024-07-14 ENCOUNTER — Encounter (HOSPITAL_COMMUNITY): Payer: Self-pay

## 2024-07-14 DIAGNOSIS — R627 Adult failure to thrive: Secondary | ICD-10-CM

## 2024-07-14 DIAGNOSIS — Z515 Encounter for palliative care: Secondary | ICD-10-CM | POA: Diagnosis not present

## 2024-07-14 DIAGNOSIS — N179 Acute kidney failure, unspecified: Principal | ICD-10-CM

## 2024-07-14 DIAGNOSIS — Z7189 Other specified counseling: Secondary | ICD-10-CM | POA: Diagnosis not present

## 2024-07-14 LAB — URINALYSIS, ROUTINE W REFLEX MICROSCOPIC
Bacteria, UA: NONE SEEN
Bilirubin Urine: NEGATIVE
Glucose, UA: NEGATIVE mg/dL
Hgb urine dipstick: NEGATIVE
Ketones, ur: 5 mg/dL — AB
Leukocytes,Ua: NEGATIVE
Nitrite: NEGATIVE
Protein, ur: 100 mg/dL — AB
Specific Gravity, Urine: 1.023 (ref 1.005–1.030)
pH: 6 (ref 5.0–8.0)

## 2024-07-14 MED ORDER — GLYCOPYRROLATE 1 MG PO TABS: 1.0000 mg | ORAL_TABLET | ORAL | Status: DC | PRN

## 2024-07-14 MED ORDER — QUETIAPINE FUMARATE 100 MG PO TABS
200.0000 mg | ORAL_TABLET | Freq: Two times a day (BID) | ORAL | Status: DC
  Administered 2024-07-14 – 2024-07-22 (×18): 200 mg via ORAL
  Filled 2024-07-14 (×11): qty 2
  Filled 2024-07-14: qty 8
  Filled 2024-07-14 (×7): qty 2

## 2024-07-14 MED ORDER — LAMOTRIGINE 25 MG PO TABS
125.0000 mg | ORAL_TABLET | Freq: Every day | ORAL | Status: DC
  Administered 2024-07-14 – 2024-07-21 (×9): 125 mg via ORAL
  Filled 2024-07-14 (×10): qty 1
  Filled 2024-07-14: qty 5

## 2024-07-14 MED ORDER — HALOPERIDOL LACTATE 2 MG/ML PO CONC: 0.5000 mg | ORAL | Status: DC | PRN

## 2024-07-14 MED ORDER — LEVOTHYROXINE SODIUM 75 MCG PO TABS
150.0000 ug | ORAL_TABLET | Freq: Every day | ORAL | Status: DC
  Administered 2024-07-14: 150 ug via ORAL
  Filled 2024-07-14: qty 2

## 2024-07-14 MED ORDER — GLYCOPYRROLATE 0.2 MG/ML IJ SOLN: 0.2000 mg | INTRAMUSCULAR | Status: DC | PRN

## 2024-07-14 MED ORDER — BIOTENE DRY MOUTH MT LIQD: 15.0000 mL | OROMUCOSAL | Status: DC | PRN

## 2024-07-14 MED ORDER — MAGNESIUM HYDROXIDE 400 MG/5ML PO SUSP: 30.0000 mL | Freq: Every day | ORAL | Status: DC | PRN

## 2024-07-14 MED ORDER — BISACODYL 10 MG RE SUPP: 10.0000 mg | Freq: Every day | RECTAL | Status: DC | PRN

## 2024-07-14 MED ORDER — ROSUVASTATIN CALCIUM 20 MG PO TABS
20.0000 mg | ORAL_TABLET | Freq: Every day | ORAL | Status: DC
  Administered 2024-07-14: 20 mg via ORAL
  Filled 2024-07-14: qty 1

## 2024-07-14 MED ORDER — DOCUSATE SODIUM 100 MG PO CAPS
100.0000 mg | ORAL_CAPSULE | Freq: Two times a day (BID) | ORAL | Status: DC
  Administered 2024-07-14 (×2): 100 mg via ORAL
  Filled 2024-07-14 (×2): qty 1

## 2024-07-14 MED ORDER — ENSURE PLUS HIGH PROTEIN PO LIQD
237.0000 mL | Freq: Two times a day (BID) | ORAL | Status: DC
  Administered 2024-07-14 – 2024-07-22 (×14): 237 mL via ORAL
  Filled 2024-07-14 (×2): qty 237

## 2024-07-14 MED ORDER — DIVALPROEX SODIUM 500 MG PO DR TAB
500.0000 mg | DELAYED_RELEASE_TABLET | Freq: Two times a day (BID) | ORAL | Status: DC
  Administered 2024-07-14 – 2024-07-22 (×18): 500 mg via ORAL
  Filled 2024-07-14 (×20): qty 1

## 2024-07-14 MED ORDER — POLYVINYL ALCOHOL 1.4 % OP SOLN: 1.0000 [drp] | Freq: Four times a day (QID) | OPHTHALMIC | Status: DC | PRN

## 2024-07-14 MED ORDER — ACETAMINOPHEN 650 MG RE SUPP: 650.0000 mg | Freq: Four times a day (QID) | RECTAL | Status: DC | PRN

## 2024-07-14 MED ORDER — HALOPERIDOL LACTATE 5 MG/ML IJ SOLN: 0.5000 mg | INTRAMUSCULAR | Status: DC | PRN

## 2024-07-14 MED ORDER — FENTANYL CITRATE PF 50 MCG/ML IJ SOSY: 25.0000 ug | PREFILLED_SYRINGE | INTRAMUSCULAR | Status: DC | PRN

## 2024-07-14 MED ORDER — HALOPERIDOL 0.5 MG PO TABS: 0.5000 mg | ORAL_TABLET | ORAL | Status: DC | PRN

## 2024-07-14 MED ORDER — PANTOPRAZOLE SODIUM 40 MG PO TBEC
40.0000 mg | DELAYED_RELEASE_TABLET | Freq: Every day | ORAL | Status: DC
  Administered 2024-07-14: 40 mg via ORAL
  Filled 2024-07-14: qty 1

## 2024-07-14 MED ORDER — ACETAMINOPHEN 325 MG PO TABS: 650.0000 mg | ORAL_TABLET | Freq: Four times a day (QID) | ORAL | Status: DC | PRN

## 2024-07-14 MED ORDER — PANTOPRAZOLE SODIUM 40 MG PO TBEC: 40.0000 mg | DELAYED_RELEASE_TABLET | Freq: Two times a day (BID) | ORAL | Status: DC

## 2024-07-14 MED ORDER — POLYETHYLENE GLYCOL 3350 17 G PO PACK
17.0000 g | PACK | Freq: Two times a day (BID) | ORAL | Status: DC
  Administered 2024-07-14 (×2): 17 g via ORAL
  Filled 2024-07-14 (×2): qty 1

## 2024-07-14 MED ORDER — ENOXAPARIN SODIUM 40 MG/0.4ML IJ SOSY
40.0000 mg | PREFILLED_SYRINGE | Freq: Every day | INTRAMUSCULAR | Status: DC
  Administered 2024-07-14: 40 mg via SUBCUTANEOUS
  Filled 2024-07-14: qty 0.4

## 2024-07-14 MED ORDER — ONDANSETRON HCL 4 MG PO TABS: 4.0000 mg | ORAL_TABLET | Freq: Three times a day (TID) | ORAL | Status: DC | PRN

## 2024-07-14 MED ORDER — SENNA 8.6 MG PO TABS
1.0000 | ORAL_TABLET | Freq: Every day | ORAL | Status: DC
  Administered 2024-07-14 – 2024-07-22 (×7): 8.6 mg via ORAL
  Filled 2024-07-14 (×7): qty 1

## 2024-07-14 NOTE — Plan of Care (Signed)
 FMTS Interim Progress Note  S: Pt is doing well with no concerns reported, though known poor historian given neurologic history.  O: BP 113/78   Pulse 78   Temp 98.1 F (36.7 C) (Oral)   Resp 16   SpO2 100%   Physical Exam: General: Patient is lying in bed with head of bed elevated, no acute distress. Cardiovascular: Regular rate and rhythm, no murmurs/rubs/gallops. Respiratory: Normal work of breathing on room air. Clear to auscultation bilaterally; no wheezes, crackles. Abdomen: Bowel sounds present and normoactive bilaterally. Soft, nondistended, nontender. Ext: Skin warm, dry. No bilateral lower extremity edema. Neuro: Pt alert and largely appropriately responding to questions, intermittently refusing to answer or answering nonsensically. Known developmental delay.  A/P: Failure to thrive in adult - Palliative consulted, appreciate recs - Sister is HPOA, will make sure to keep informed throughout hospitalization - Pending GOC discussion, consider adding PT/OT/SLP/RD if not on hospice; if on hospice, pare down medications  AKI - Holding on further IV hydration and nutrition pending GOC conversation  Larraine Palma, MD 07/14/2024, 7:19 AM PGY-1, [facility] Family Medicine Service pager [phone]

## 2024-07-14 NOTE — Progress Notes (Signed)
 Palliative:  Full note to follow. Long discussion with legal guardian Marval Mulberry at bedside. Ms. Mulberry has made the decision to pursue full comfort care at this time. We will place referral for [facility] evaluation.   No charge  Bernarda Kitty, NP Palliative Medicine Team Pager [phone] (Please see [email or web address] for schedule) Team Phone [phone]

## 2024-07-14 NOTE — Plan of Care (Signed)

## 2024-07-14 NOTE — Care Management CC44 (Signed)
 Condition Code 44 Documentation Completed  Patient Details  Name: [name] MRN: [id number] Date of Birth: [birth year]   Condition Code 44 given:  Yes Patient signature on Condition Code 44 notice:  no, [name] Benton ( sister/POA) Documentation of 2 MD's agreement:  Yes Code 44 added to claim:  Yes    Rosalva Jon Bloch, RN 07/14/2024, 3:56 PM

## 2024-07-14 NOTE — Assessment & Plan Note (Addendum)
 Likely prerenal in the setting of decreased p.o. intake as above. SCr 1.61 with baseline SCr 0.9-1.1, s/p LR 1L bolus in the ED.  Likely to go comfort care as above, will hold on further IV hydration and nutrition pending goals of care conversation.

## 2024-07-14 NOTE — Care Management Obs Status (Signed)
 MEDICARE OBSERVATION STATUS NOTIFICATION   Patient Details  Name: [name] MRN: [id number] Date of Birth: [birth year]   Medicare Observation Status Notification Given:  Yes    Rosalva Jon Bloch, RN 07/14/2024, 3:55 PM

## 2024-07-14 NOTE — Consult Note (Signed)
 Consultation Note Date: 07/14/2024   Patient Name: [name]  DOB: 1956-03-23  MRN: [id number]  Age / Sex: 68 y.o., male  PCP: Royals, Philis HERO Referring Physician: Delores Suzann HERO, MD  Reason for Consultation: Establishing goals of care  HPI/Patient Profile: 68 y.o. male  with past medical history of developmental delay 2/2 prior craniotomy, schizophrenia, HTN, HLD, IDA and seizure disorder  admitted on 07/13/2024 with poor oral intake foer 3 weeks, likely in the setting of adult failure to thrive.  It is worth noting that the patient was recently hospitalized from July 02, 2024, to July 06, 2024, due to altered mental status. During that hospital stay, the palliative care team was involved to help establish goals of care.  Today, visited patient at bedside, his sister/POA Marval Mulberry is present at bedside. Patient is chronically-ill appearing, however not in any significant form of distress and is breathing spontaneously on room air. He is minimally responsive but responsive to tactile stimuli.   PMT has been consulted this time to assist with goals of care conversation. Patient/Family face treatment option decisions, advanced directive decisions and anticipatory care needs.   Clinical Assessment and Goals of Care:  We have reviewed medical records including EPIC notes, labs and imaging, assessed the patient and then met with patient to discuss diagnosis prognosis, GOC, EOL wishes, disposition and options.  I introduced Palliative Medicine as specialized medical care for people living with serious illness. It focuses on providing relief from the symptoms and stress of a serious illness. The goal is to improve quality of life for both the patient and the family.     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 [name] (sister/POA) is very well aware of patient's current medical conditions and overall poor prognosis.   Social History: Patient lives at [facility] group home since 2012. He has 7 siblings with Marval being the more involved family member when it comes to his healthcare needs.  Functional and Nutritional State: Reportedly patient has progressive loss of ADLs for the past few weeks. Also with reports of poor oral intake.   Palliative Symptoms: Poor oral intake/failure to thrive, progressive cognitive decline and loss of ability to carry out baseline ADLs.   Advance Directives: The sister Marval Mulberry [phone]) is the POA.    Code Status: Patient is DNR-Comfort (Do Not Resuscitate, Do Not Intubate)  Discussion:  Today, we visited the patient at his bedside, accompanied by his sister and Power of [place], Marval Mulberry. Due to the patient's current cognitive decline, we were unable to obtain information directly from him, so the discussion was held with [name]. As his POA and the most involved family member, Marval oversees Mr. Guiney's healthcare needs. 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  [name] shared that her brother's overall health has significantly declined over the past few months (2 admissions in 6 months, 9-day readmission), with a notable progressive loss of ADL function and poor oral intake. She mentioned that he has experienced multiple health setbacks over the years. Marval has a clear understanding of her brother's poor health trajectory and does not want him to endure further suffering, as the outcome is unlikely to change. Before this meeting, Marval had come to terms with wanting her brother to be  comfortable and at peace.  Given the poor prognosis and overall decline in  his health, Marval has chosen to transition the patient to DNR-Comfort. We discussed with [name] what DNR-Comfort entails, including the decision to forgo aggressive medical interventions and refocus the care plan on ensuring the patient's comfort, aligning with the goals and preferences of both the patient and his POA. We anticipate patient to transition to in-facility hospice services post hospital discharge, preference will be [facility] per POA.   [name] expressed her appreciation for the presence of the Palliative medical team during this transition period and emphasized how our support helped her make an informed decision that best meets her brother's needs.  The difference between aggressive medical intervention and comfort care was considered in light of the patient's goals of care. Hospice and Palliative Care services outpatient were explained and offered.    Questions and concerns were addressed. The family was encouraged to call with questions or concerns.  PMT will continue to support holistically.   HCPOA Marval Mulberry [phone]   Code Status/Advance Care Planning: DNR-Comfort  SUMMARY OF RECOMMENDATIONS    Transition to comfort measures, no further escalation in level of care necessary.  Continue to provide holistic palliative support. Provide spiritual support with the help of our chaplain services (order placed). TOC order placed to help assist family with hospice placement post hospital stay. Communicated with hospice liaison for in-facility hospice evaluation.  Symptom Management: Start Fentanyl  25 mcg Q1 PRN for pain, glycopyrrolate  0.2mg  Q4 PRN for excessive secretions, haloperidol  PRN for agitation. Provided psycho-social emotional support.    Palliative Prophylaxis:  Aspiration, Delirium Protocol, Eye Care, Frequent Pain Assessment, and Oral Care  Additional Recommendations (Limitations, Scope, Preferences): Full Comfort Care  Psycho-social/Spiritual:   Desire for further Chaplaincy support:yes  Prognosis:  Prognosis grim with decreased intake and worsening renal function.   Discharge Planning: Hospice facility      Primary Diagnoses: Present on Admission: **None**   Physical Exam Vitals reviewed.  Constitutional:      Appearance: He is ill-appearing.  Cardiovascular:     Rate and Rhythm: Normal rate.  Musculoskeletal:     Comments: Bedbound   Neurological:     Motor: Weakness present.     Comments: Unable to assess. Patient minimally responsive to tactile and verbal stimuli.       Vital Signs: BP 96/61 (BP Location: Right Arm)   Pulse 75   Temp 98.8 F (37.1 C)   Resp 16   Ht 5' 8 (1.727 m)   Wt 50.7 kg   SpO2 97%   BMI 17.00 kg/m  Pain Scale: Faces   Pain Score: 0-No pain   SpO2: SpO2: 97 % O2 Device:SpO2: 97 % O2 Flow Rate: .    Palliative Assessment/Data:30%    Total time: I spent 75 minutes in the care of the patient today in the above activities and documenting the encounter.   Kathlyne JULIANNA Tracie Mickey, NP  Palliative Medicine Team Team phone # [phone]  Thank you for allowing the Palliative Medicine Team to assist in the care of this patient. Please utilize secure chat with additional questions, if there is no response within 30 minutes please call the above phone number.  Palliative Medicine Team providers are available by phone from 7am to 7pm daily and can be reached through the team cell phone.  Should this patient require assistance outside of these hours, please call the patient's attending physician.

## 2024-07-14 NOTE — Hospital Course (Addendum)
 [name] is a 68 y.o. year old with a history of schizophrenia, seizure disorder, IDA, HTN, HLD, Barrett esophagus, dysphagia who presented with poor PO intake and was admitted to the [facility] Medicine Teaching Service for failure to thrive and ultimately made comfort care based on palliative discussion.  Failure to Thrive Progressive decline over months with inadequate PO intake to sustain life. Patient's sister [name], his HPOA, interested in palliative care. Palliative met with sister on 7/22, ultimately with decision to go comfort care. Medications deescalated following. ***Unable to transition to inpatient hospice, discharged to SNF.  AKI Cr increased to 1.61 on admission (baseline 0.9-1.1); suspect prerenal in setting of decreased PO intake. Patient got 1L bolus LR in ED; no additional IV fluids given based on GOC conversation resulting in comfort care. Additional labs not collected.   Other chronic conditions were medically managed with home medications and formulary alternatives as necessary (seizure/mood disorder, hypothyroidism, GERD, constipation).

## 2024-07-14 NOTE — Assessment & Plan Note (Addendum)
 Progressive decline with inadequate p.o. intake to sustain life, POA desires transition with palliative care.  Will hold on all additional workup and consults pending further discussion and anticipate likely transition to comfort care. -Admitted to FMTS service, Dr. Madelon attending -Palliative care consult -Continue dysphagia 2 diet -Continue home meds pending goals of care conversation w/ family 7/22 AM.

## 2024-07-15 DIAGNOSIS — Z7189 Other specified counseling: Secondary | ICD-10-CM | POA: Diagnosis not present

## 2024-07-15 DIAGNOSIS — Z515 Encounter for palliative care: Secondary | ICD-10-CM

## 2024-07-15 DIAGNOSIS — R627 Adult failure to thrive: Secondary | ICD-10-CM | POA: Diagnosis not present

## 2024-07-15 NOTE — Assessment & Plan Note (Signed)
 Seizure/mood disorder: Continue Depakote  500 mg twice daily, lamotrigine  125 mg daily, quetiapine  200 mg twice daily Hypothyroidism: Stopping levothyroxine  150 mcg daily in setting of comfort care GERD: Stopping pantoprazole  40 mg in setting of comfort care Constipation: Continue Colace 100 mg twice daily

## 2024-07-15 NOTE — TOC Initial Note (Signed)
 Transition of Care [facility]) - Initial/Assessment Note    Patient Details  Name: [name] MRN: [id number] Date of Birth: July 11, 1956  Transition of Care [facility]) CM/SW Contact:    Luise JAYSON Pan, LCSWA Phone Number: 07/15/2024, 11:16 AM  Clinical Narrative:  Per Palliative, discussion about going full comfort care and [facility] as preference was had. [facility] has been notified and will assess patient for [facility] [facility].   CSW spoke with Marval, pts sister, about [facility] evaluating patient for [facility]. Per Marval, CSW will need to reach out to [facility] group home to notify them that patient will not be returning. [name] initiated a three way call with CSW and [facility] nurse Marden) so everyone is aware of what needs to happen next. CSW inquired about what [facility] needs from the hospital. Ginger stated she would need a letter from the hospital detailing plans for discharge so that [facility] group home can revoke their services for patient. CSW informed that as of now, [facility] still needs to evaluate patient for [facility].   Please follow up with Tabitha (cell [phone]) regarding plans for discharge and if patient is accepted at [facility].   CSW confirmed with TOC leadership, Harlene Jes, that the evaluation has to be conducted by [facility] first and the plan to move forward with [facility] should be solidified before providing information to [facility] on patient being discharged from their program. CSW called Tabitha and informed her of such; Tabitha expressed her understanding.   TOC will continue to follow.    Expected Discharge Plan: Hospice Medical Facility Barriers to Discharge: Continued Medical Work up, Other (must enter comment) [facility] bed availability)   Patient Goals and CMS Choice Patient states their goals for this hospitalization and ongoing recovery are:: Unable to assess          Expected Discharge Plan and Services In-house Referral: Clinical Social  Work Discharge Planning Services: CM Consult   Living arrangements for the past 2 months: Group Home                                      Prior Living Arrangements/Services Living arrangements for the past 2 months: Group Home Lives with:: Facility Resident Patient language and need for interpreter reviewed:: Yes Do you feel safe going back to the place where you live?: Yes      Need for Family Participation in Patient Care: Yes (Comment) Care giver support system in place?: Yes (comment) Current home services:  (none) Criminal Activity/Legal Involvement Pertinent to Current Situation/Hospitalization: No - Comment as needed  Activities of Daily Living   ADL Screening (condition at time of admission) Independently performs ADLs?: No Does the patient have a NEW difficulty with bathing/dressing/toileting/self-feeding that is expected to last >3 days?: No Does the patient have a NEW difficulty with getting in/out of bed, walking, or climbing stairs that is expected to last >3 days?: No Does the patient have a NEW difficulty with communication that is expected to last >3 days?: No Is the patient deaf or have difficulty hearing?: No Does the patient have difficulty seeing, even when wearing glasses/contacts?: No Does the patient have difficulty concentrating, remembering, or making decisions?: Yes  Permission Sought/Granted Permission sought to share information with : Facility Medical sales representative, Family Supports Permission granted to share information with : No (Family contact information in chart)  Share Information with NAME: Marval  Permission granted to share info w AGENCY: [facility]  Permission granted to share info w Relationship: Sister (POA?)  Permission granted to share info w Contact Information: [phone]  Emotional Assessment Appearance:: Appears stated age Attitude/Demeanor/Rapport: Unable to Assess Affect (typically observed): Unable to  Assess Orientation: : Oriented to Self Alcohol  / Substance Use: Not Applicable Psych Involvement: No (comment)  Admission diagnosis:  AKI (acute kidney injury) (HCC) [N17.9] Patient Active Problem List   Diagnosis Date Noted   Hospice care 07/15/2024   Failure to thrive in adult 07/14/2024   AKI (acute kidney injury) (HCC) 07/14/2024   Adverse effect of other antipsychotics and neuroleptics, initial encounter 07/04/2024   Malnutrition of moderate degree 07/03/2024   Encephalopathy 07/02/2024   Congenital cognitive impairment 05/15/2024   History of stroke with residual deficit 05/15/2024   AMS (altered mental status)  Weakness 05/14/2024   Chronic health problem 05/14/2024   Polypharmacy 06/26/2023   Mixed hyperlipidemia 05/26/2023   Intellectual developmental delay 05/26/2023   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) 10/26/2021   Seizure disorder (HCC) 10/26/2021   History of Barrett's esophagus 11/18/2020   History of Esophageal dysmotility 11/18/2020   History of Esophageal dilation and tortuosity on EGD 11/18/2020   Dysphagia    Hiatal hernia 10/21/2020   Emesis 07/24/2015   Weight loss, unintentional 07/04/2009   ANEMIA, IRON DEFICIENCY 05/04/2008   Hypothyroidism 04/05/2008   Schizophrenia, unspecified type (HCC) 04/05/2008   Depression 04/05/2008   Essential hypertension 04/05/2008   GERD 04/05/2008   Gastroparesis 04/05/2008   Acute metabolic encephalopathy 04/05/2008   HIATAL HERNIA 04/01/2002   PCP:  Hilma Philis HERO Pharmacy:   [phone] [place], [place] - [address] AT [address] [facility] [address]  [place] [address] Phone: [phone] Fax: [phone]  [facility] [address] [place] [address] Phone: [phone] Fax: [phone]     Social Drivers of Health (SDOH) Social History: SDOH Screenings   Food  Insecurity: No Food Insecurity (07/14/2024)  Housing: Low Risk  (07/14/2024)  Transportation Needs: No Transportation Needs (07/14/2024)  Utilities: Not At Risk (07/14/2024)  Depression (PHQ2-9): Low Risk  (07/10/2022)  Social Connections: Unknown (07/14/2024)  Tobacco Use: Low Risk  (07/02/2024)   SDOH Interventions:     Readmission Risk Interventions     No data to display

## 2024-07-15 NOTE — Assessment & Plan Note (Signed)
 Patient now comfort care, no additional labs.

## 2024-07-15 NOTE — Assessment & Plan Note (Signed)
-   Ensure twice daily between meals - Dysphagia 2 diet - No indication for SLP/RD/PT/OT given patient comfort care

## 2024-07-15 NOTE — Plan of Care (Signed)
   Problem: Education: Goal: Knowledge of General Education information will improve Description Including pain rating scale, medication(s)/side effects and non-pharmacologic comfort measures Outcome: Progressing   Problem: Health Behavior/Discharge Planning: Goal: Ability to manage health-related needs will improve Outcome: Progressing

## 2024-07-15 NOTE — Assessment & Plan Note (Addendum)
-   Comfort care measures - Palliative following, appreciate assistance in patient's care.

## 2024-07-15 NOTE — Progress Notes (Signed)
     Daily Progress Note Intern Pager: [phone]  Patient name: [name] Medical record number: [id number] Date of birth: 1956-08-04 Age: 68 y.o. Gender: male  Primary Care Provider: Hilma Philis HERO Consultants: Palliative Code Status: DNR/DNI  Pt Overview and Major Events to Date:  - 7/22: Admitted, palliative consulted + GOC conversation with sister had  Medical Decision Making:  [name] is a 68 y.o. male presenting with failure to thrive.   Long GOC conversation with patient's sister, his HPOA, yesterday 7/22 with palliative care. Ultimately, decision made to make patient comfort care. Appropriate measures subsequently taken. Awaiting placement at [facility].  Pertinent PMH/PSH includes schizophrenia, developmental delay, hx craniotomy for tumor, Barrett esophagus, dysphagia, seizure disorder, IDA, HTN, HLD. Assessment & Plan Hospice care - Comfort care measures - Palliative following, appreciate assistance in patient's care. Failure to thrive in adult - Ensure twice daily between meals - Dysphagia 2 diet - No indication for SLP/RD/PT/OT given patient comfort care AKI (acute kidney injury) [facility]) Patient now comfort care, no additional labs. Chronic health problem Seizure/mood disorder: Continue Depakote  500 mg twice daily, lamotrigine  125 mg daily, quetiapine  200 mg twice daily Hypothyroidism: Stopping levothyroxine  150 mcg daily in setting of comfort care GERD: Stopping pantoprazole  40 mg in setting of comfort care Constipation: Continue Colace 100 mg twice daily   FEN/GI: Dysphagia 2 diet PPx: None (patient comfort care) Dispo: [facility] pending availability  Subjective:  Patient doing well with no complaints, though he is a poor historian. Sister Marval also present, and reports he did well overnight with no concerns and was more with it and talkative. Patient not wanting breakfast this morning.  Objective: Temp:  [97.7 F (36.5 C)-98.8 F  (37.1 C)] 97.7 F (36.5 C) (07/23 0742) Pulse Rate:  [64-84] 84 (07/23 0742) Resp:  [16-18] 16 (07/23 0742) BP: (90-97)/(53-61) 97/58 (07/23 0742) SpO2:  [92 %-99 %] 92 % (07/23 0742) Physical Exam: General: Lying in bed, no acute distress. Cardiovascular: Regular rate and rhythm, no murmurs/rubs/gallops. Respiratory: Normal work of breathing on room air. Clear to auscultation bilaterally; no wheezes, crackles. Abdomen: Bowel sounds present and normoactive bilaterally. Soft, nondistended, nontender. Extremities: Skin warm, dry. No bilateral lower extremity edema. Neuro: Alert and intermittently appropriately responding to questions; would state no ma'am when asked by sister if he wanted to eat or drink, otherwise intermittently speaking nonsensically.  Laboratory: Most recent CBC Lab Results  Component Value Date   WBC 4.7 07/13/2024   HGB 11.2 (L) 07/13/2024   HCT 33.5 (L) 07/13/2024   MCV 95.7 07/13/2024   PLT 178 07/13/2024   Most recent BMP    Latest Ref Rng & Units 07/13/2024    6:00 PM  BMP  Glucose 70 - 99 mg/dL 897   BUN 8 - 23 mg/dL 35   Creatinine 9.38 - 1.24 mg/dL 8.38   Sodium 864 - 854 mmol/L 138   Potassium 3.5 - 5.1 mmol/L 4.3   Chloride 98 - 111 mmol/L 100   CO2 22 - 32 mmol/L 19   Calcium  8.9 - 10.3 mg/dL 9.5      Larraine Palma, MD 07/15/2024, 10:59 AM  PGY-1, [place] [facility] FPTS Intern pager: [phone], text pages welcome Secure chat group [facility] Teaching Service

## 2024-07-15 NOTE — Plan of Care (Signed)
  Problem: Clinical Measurements: Goal: Will remain free from infection Outcome: Progressing Goal: Respiratory complications will improve Outcome: Progressing Goal: Cardiovascular complication will be avoided Outcome: Progressing   Problem: Pain Managment: Goal: General experience of comfort will improve and/or be controlled Outcome: Progressing   Problem: Safety: Goal: Ability to remain free from injury will improve Outcome: Progressing

## 2024-07-15 NOTE — Progress Notes (Addendum)
 This chaplain responded to PMT NP-Erwin consult for spiritual care. The Pt. sister-[name] is at the bedside and is a source of Pt. communication support during the visit. The chaplain witnessed the Pt. give [name] verbal directions during the feeding of applesauce. [name] shares the Pt. is not experiencing pain at the time of the visit.   The chaplain listened reflectively as the Pt. and [name] describe their faith and reliance on God as a source of support. A large family is also providing [name] with time to care well for herself. [name] described the Pt. faith as celebration of life and death. The chaplain understands the Pt. enjoys watching TV-specifically [employer].   The chaplain's invitation for prayer was accepted by the Pt. And [name]. The chaplain learned the Pt. prefers not to be touched.  This chaplain is available for F/U spiritual care as needed.  Chaplain Leeroy Hummer [phone]

## 2024-07-15 NOTE — Progress Notes (Signed)
 Daily Progress Note   Patient Name: [name]       Date: 07/15/2024 DOB: February 02, 1956  Age: 68 y.o. MRN#: [id number] Attending Physician: Delores Suzann HERO, MD Primary Care Physician: Hilma Philis HERO Admit Date: 07/13/2024  Reason for Consultation/Follow-up: Establishing goals of care  Subjective: Today, we visited the patient at his bedside. Present during the visit were his sister and POA, Marval Mulberry, and his niece, Rico. The patient appeared comfortable and not in any distress. He was observed eating lunch, being spoon-fed by Marval. It was noted that his appetite remains decreased, no breakfast this morning, ate a little mashed potatoes at lunch. He continues to avoid liquids, though he did take a few sips of Ensure. He is sleeping most of the time with brief periods of wakefulness.   Marval mentioned that the hospital chaplain visited them this morning, providing spiritual support. The CSW has also communicated with the staff at the [facility] group home to discuss and address the necessary arrangements for the patient's transition from the group home to potentially in-facility hospice care at [facility] with [facility].  Encouraged to discuss concerns and all questions were properly addressed.     Length of Stay: 1  Current Medications: Scheduled Meds:   divalproex   500 mg Oral Q12H   feeding supplement  237 mL Oral BID BM   lamoTRIgine   125 mg Oral QHS   QUEtiapine   200 mg Oral BID   senna  1 tablet Oral Daily    Continuous Infusions:   PRN Meds: acetaminophen  **OR** acetaminophen , antiseptic oral rinse, artificial tears, bisacodyl , fentaNYL  (SUBLIMAZE ) injection, glycopyrrolate  **OR** glycopyrrolate  **OR** glycopyrrolate , haloperidol  **OR** haloperidol  **OR**  haloperidol  lactate, magnesium  hydroxide, ondansetron   Physical Exam Vitals and nursing note reviewed.  Cardiovascular:     Rate and Rhythm: Normal rate.  Pulmonary:     Effort: Pulmonary effort is normal.  Musculoskeletal:     Comments: Bed-bound  Skin:    General: Skin is warm and dry.  Neurological:     Mental Status: Mental status is at baseline.     Comments: More alert and responsive today.              Vital Signs: BP (!) 97/58 (BP Location: Right Arm)   Pulse 84   Temp 97.7 F (36.5 C) (Oral)  Resp 16   Ht 5' 8 (1.727 m)   Wt 50.7 kg   SpO2 92%   BMI 17.00 kg/m  SpO2: SpO2: 92 % O2 Device: O2 Device: Room Air O2 Flow Rate:    Intake/output summary:  Intake/Output Summary (Last 24 hours) at 07/15/2024 1416 Last data filed at 07/15/2024 1100 Gross per 24 hour  Intake 110 ml  Output 1150 ml  Net -1040 ml   LBM: Last BM Date :  (unknown) Baseline Weight: Weight: 50.7 kg Most recent weight: Weight: 50.7 kg       Palliative Assessment/Data: 30%      Patient Active Problem List   Diagnosis Date Noted   Hospice care 07/15/2024   Failure to thrive in adult 07/14/2024   AKI (acute kidney injury) (HCC) 07/14/2024   Adverse effect of other antipsychotics and neuroleptics, initial encounter 07/04/2024   Malnutrition of moderate degree 07/03/2024   Encephalopathy 07/02/2024   Congenital cognitive impairment 05/15/2024   History of stroke with residual deficit 05/15/2024   AMS (altered mental status)  Weakness 05/14/2024   Chronic health problem 05/14/2024   Polypharmacy 06/26/2023   Mixed hyperlipidemia 05/26/2023   Intellectual developmental delay 05/26/2023   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) 10/26/2021   Seizure disorder (HCC) 10/26/2021   History of Barrett's esophagus 11/18/2020   History of Esophageal dysmotility 11/18/2020   History of Esophageal dilation and tortuosity on EGD 11/18/2020    Dysphagia    Hiatal hernia 10/21/2020   Emesis 07/24/2015   Weight loss, unintentional 07/04/2009   ANEMIA, IRON DEFICIENCY 05/04/2008   Hypothyroidism 04/05/2008   Schizophrenia, unspecified type (HCC) 04/05/2008   Depression 04/05/2008   Essential hypertension 04/05/2008   GERD 04/05/2008   Gastroparesis 04/05/2008   Acute metabolic encephalopathy 04/05/2008   HIATAL HERNIA 04/01/2002    Palliative Care Assessment & Plan   Patient Profile: 68 y.o. male  with past medical history of developmental delay 2/2 prior craniotomy, schizophrenia, HTN, HLD, IDA and seizure disorder  admitted on 07/13/2024 with poor oral intake for 3 weeks, likely in the setting of adult failure to thrive.   Assessment: The patient appeared comfortable and not in any distress, showing increased alertness and responsiveness today. He was observed eating lunch, with [name] spoon-feeding him. It was noted that his appetite remains decreased, no breakfast this morning, ate a little mashed potatoes at lunch. He continues to avoid liquids, though he did take a few sips of Ensure. He is sleeping most of the time with brief periods of wakefulness.   He remains on comfort measures, with his poor health trajectory unchanged. The support from his sister/POA is evident.  Recommendations/Plan: Code Status: DNR-Comfort. No further escalation in level of care necessary.  Continue to provide holistic palliative support. Continue with current pharmacologic symptom measures: Start Fentanyl  25 mcg Q1 PRN for pain, glycopyrrolate  0.2mg  Q4 PRN for excessive secretions, haloperidol  PRN for agitation.  Provided and continue psycho-social emotional support. Of note, [facility] is scheduled to visit patient tomorrow, 07/16/2024 to assess/evaluate patient's eligibility for [facility].   Goals of Care and Additional Recommendations: Limitations on Scope of Treatment: Full Comfort Care  Code Status:    Code Status Orders   (From admission, onward)           Start     Ordered   07/14/24 1710  Do not attempt resuscitation (DNR) - Comfort care  Continuous       Question Answer  Comment  If patient has no pulse and is not breathing Do Not Attempt Resuscitation   In Pre-Arrest Conditions (Patient Is Breathing and Has a Pulse) Provide comfort measures. Relieve any mechanical airway obstruction. Avoid transfer unless required for comfort.   Consent: Discussion documented in EHR or advanced directives reviewed      07/14/24 1710           Code Status History     Date Active Date Inactive Code Status Order ID Comments User Context   07/14/2024 0101 07/14/2024 1710 Limited: Do not attempt resuscitation (DNR) -DNR-LIMITED -Do Not Intubate/DNI  [id number]  Manon Jester, DO ED   07/02/2024 1912 07/06/2024 2039 Full Code [id number]  Toma, Dimitry, MD ED   05/14/2024 1703 05/15/2024 1829 Full Code [id number]  Lafe Domino, DO ED   06/05/2023 0746 06/06/2023 2250 Full Code [id number]  Alto Isaiah CROME, NP ED   06/05/2023 0648 06/05/2023 0746 Full Code [id number]  Alto Isaiah CROME, NP ED   05/25/2023 1935 05/26/2023 2230 Full Code [id number]  Laurita Cort DASEN, MD ED   04/09/2023 1354 04/15/2023 2052 Full Code [id number]  Caleen Burgess Campbell, MD ED   11/15/2020 1524 12/07/2020 2130 Full Code [id number]  Pegge Toribio PARAS, PA-C Inpatient   11/15/2020 1524 11/15/2020 1524 Full Code [id number]  Pegge Toribio PARAS, PA-C Inpatient   10/21/2020 0234 11/15/2020 1505 Full Code [id number]  Alfornia Madison, MD ED   09/05/2020 1522 09/06/2020 2251 Full Code [id number]  Henriette Mora, MD ED   07/24/2015 0158 07/27/2015 1951 Full Code [id number]  Celinda Alm Lot, MD Inpatient   01/08/2015 2102 01/11/2015 1750 Full Code [id number]  Hilma Rankins, MD Inpatient      Advance Directive Documentation    Flowsheet Row Most Recent Value  Type of Advance Directive Healthcare Power of Attorney  Pre-existing out of facility DNR order (yellow form or pink  MOST form) --  MOST Form in Place? --    Prognosis:  Remains poor given the overall decline with ADLs and reduced intake.   Discharge Planning: Hospice facility to [facility].   Time Spent:  I spent  35 minutes in the care of the patient today in the above activities and documenting the encounter.    Thank you for allowing the Palliative Medicine Team to assist in the care of this patient.     Kathlyne JULIANNA Tracie Mickey, NP  Please contact Palliative Medicine Team phone at [phone] for questions and concerns.

## 2024-07-16 DIAGNOSIS — R627 Adult failure to thrive: Secondary | ICD-10-CM | POA: Diagnosis not present

## 2024-07-16 NOTE — Plan of Care (Signed)
   Problem: Education: Goal: Knowledge of General Education information will improve Description Including pain rating scale, medication(s)/side effects and non-pharmacologic comfort measures Outcome: Progressing   Problem: Health Behavior/Discharge Planning: Goal: Ability to manage health-related needs will improve Outcome: Progressing

## 2024-07-16 NOTE — Assessment & Plan Note (Addendum)
 Patient denied from [facility]; will work on finding SNF versus LTC per sister's agreement. - PT eval prior to placement - Comfort care measures - Palliative following, appreciate assistance in patient's care.

## 2024-07-16 NOTE — NC FL2 (Signed)
 [place]  MEDICAID FL2 LEVEL OF CARE FORM     IDENTIFICATION  Patient Name: [name] Birthdate: 1956/12/03 Sex: male Admission Date (Current Location): 07/13/2024  [facility] and [place] Number:  Producer, television/film/video and Address:  [facility], [address] [place], [place] [address]      Provider Number: [id number]  Attending Physician Name and Address:  Rosalynn Camie CROME, MD  Relative Name and Phone Number:       Current Level of Care: Hospital Recommended Level of Care: Skilled Nursing Facility Prior Approval Number:    Date Approved/Denied:   PASRR Number: PENDING  Discharge Plan: SNF    Current Diagnoses: Patient Active Problem List   Diagnosis Date Noted   Hospice care 07/15/2024   Goals of care, counseling/discussion 07/15/2024   Palliative care by specialist 07/15/2024   Failure to thrive in adult 07/14/2024   AKI (acute kidney injury) (HCC) 07/14/2024   Adverse effect of other antipsychotics and neuroleptics, initial encounter 07/04/2024   Malnutrition of moderate degree 07/03/2024   Encephalopathy 07/02/2024   Congenital cognitive impairment 05/15/2024   History of stroke with residual deficit 05/15/2024   AMS (altered mental status)  Weakness 05/14/2024   Chronic health problem 05/14/2024   Polypharmacy 06/26/2023   Mixed hyperlipidemia 05/26/2023   Intellectual developmental delay 05/26/2023   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) 10/26/2021   Seizure disorder (HCC) 10/26/2021   History of Barrett's esophagus 11/18/2020   History of Esophageal dysmotility 11/18/2020   History of Esophageal dilation and tortuosity on EGD 11/18/2020   Dysphagia    Hiatal hernia 10/21/2020   Emesis 07/24/2015   Weight loss, unintentional 07/04/2009   ANEMIA, IRON DEFICIENCY 05/04/2008   Hypothyroidism 04/05/2008   Schizophrenia, unspecified type (HCC) 04/05/2008   Depression  04/05/2008   Essential hypertension 04/05/2008   GERD 04/05/2008   Gastroparesis 04/05/2008   Acute metabolic encephalopathy 04/05/2008   HIATAL HERNIA 04/01/2002    Orientation RESPIRATION BLADDER Height & Weight     Self  Normal Incontinent Weight: 111 lb 12.4 oz (50.7 kg) Height:  5' 8 (172.7 cm)  BEHAVIORAL SYMPTOMS/MOOD NEUROLOGICAL BOWEL NUTRITION STATUS      Incontinent    AMBULATORY STATUS COMMUNICATION OF NEEDS Skin   Extensive Assist Verbally Normal                       Personal Care Assistance Level of Assistance  Bathing, Feeding, Dressing Bathing Assistance: Maximum assistance Feeding assistance: Limited assistance Dressing Assistance: Maximum assistance     Functional Limitations Info  Sight, Hearing, Speech Sight Info: Adequate Hearing Info: Adequate Speech Info: Adequate    SPECIAL CARE FACTORS FREQUENCY  PT (By licensed PT), OT (By licensed OT)                    Contractures Contractures Info: Not present    Additional Factors Info  Code Status Code Status Info: DNR             Current Medications (07/16/2024):  This is the current hospital active medication list Current Facility-Administered Medications  Medication Dose Route Frequency Provider Last Rate Last Admin   acetaminophen  (TYLENOL ) tablet 650 mg  650 mg Oral Q6H PRN Parker, Alicia C, NP       Or   acetaminophen  (TYLENOL ) suppository 650 mg  650 mg Rectal Q6H PRN Parker, Alicia C, NP  antiseptic oral rinse (BIOTENE) solution 15 mL  15 mL Topical PRN Parker, Alicia C, NP       artificial tears ophthalmic solution 1 drop  1 drop Both Eyes QID PRN Parker, Alicia C, NP       bisacodyl  (DULCOLAX) suppository 10 mg  10 mg Rectal Daily PRN Manon Jester, DO       divalproex  (DEPAKOTE ) DR tablet 500 mg  500 mg Oral Q12H Manon Jester, DO   500 mg at 07/16/24 1051   feeding supplement (ENSURE PLUS HIGH PROTEIN) liquid 237 mL  237 mL Oral BID BM Manon Jester, DO   237  mL at 07/16/24 1506   fentaNYL  (SUBLIMAZE ) injection 25 mcg  25 mcg Intravenous Q1H PRN Parker, Alicia C, NP       glycopyrrolate  (ROBINUL ) tablet 1 mg  1 mg Oral Q4H PRN Parker, Alicia C, NP       Or   glycopyrrolate  (ROBINUL ) injection 0.2 mg  0.2 mg Subcutaneous Q4H PRN Parker, Alicia C, NP       Or   glycopyrrolate  (ROBINUL ) injection 0.2 mg  0.2 mg Intravenous Q4H PRN Parker, Alicia C, NP       haloperidol  (HALDOL ) tablet 0.5 mg  0.5 mg Oral Q4H PRN Parker, Alicia C, NP       Or   haloperidol  (HALDOL ) 2 MG/ML solution 0.5 mg  0.5 mg Sublingual Q4H PRN Parker, Alicia C, NP       Or   haloperidol  lactate (HALDOL ) injection 0.5 mg  0.5 mg Intravenous Q4H PRN Parker, Alicia C, NP       lamoTRIgine  (LAMICTAL ) tablet 125 mg  125 mg Oral QHS Manon Jester, DO   125 mg at 07/15/24 2055   magnesium  hydroxide (MILK OF MAGNESIA) suspension 30 mL  30 mL Oral Daily PRN Manon Jester, DO       ondansetron  (ZOFRAN ) tablet 4 mg  4 mg Oral Q8H PRN Manon Jester, DO       QUEtiapine  (SEROQUEL ) tablet 200 mg  200 mg Oral BID Manon Jester, DO   200 mg at 07/16/24 1051   senna (SENOKOT) tablet 8.6 mg  1 tablet Oral Daily Parker, Alicia C, NP   8.6 mg at 07/16/24 1051     Discharge Medications: Please see discharge summary for a list of discharge medications.  Relevant Imaging Results:  Relevant Lab Results:   Additional Information SSN [id number]  [name], [place]

## 2024-07-16 NOTE — TOC Progression Note (Addendum)
 Transition of Care [facility]) - Progression Note    Patient Details  Name: [name] MRN: [id number] Date of Birth: [birth year]  [facility] CM/SW Contact  Luise JAYSON Pan, [place] Phone Number: 07/16/2024, 11:38 AM  Clinical Narrative:  Per ACC, patient does not meet criteria for [facility]/hospice. [name] (POA/Sis) would like to discuss placement options with TOC.   [facility] called CSW about getting a letter/dc summary to discontinue services with them. CSW informed [facility] that dc summary's are not generated until day of discharge, TOC will have to follow up. Per [facility], they cannot take patient back to group home with Palliative/hospice services.   CSW called Marval and explained that PT will need to evaluate patient for CSW to begin efforts towards SNF. [name] gave her verbal understanding. [name] received medicare.gov ratings for SNFs from [facility] at this time. Marval stated she is going to review and ask for family to help tour facilities. Marval stated [place] the [phone] [place] area and if possible would like SNF placement to be found near her.   CSW notified treatment team that Marval is agreeable to SNF placement and that PT would need to eval patient. MD to place new PT eval order.   TOC will continue to follow.    Expected Discharge Plan: Skilled Nursing Facility Barriers to Discharge: Continued Medical Work up, SNF Pending bed offer               Expected Discharge Plan and Services In-house Referral: Clinical Social Work Discharge Planning Services: CM Consult   Living arrangements for the past 2 months: Group Home                                       Social Drivers of Health (SDOH) Interventions SDOH Screenings   Food Insecurity: No Food Insecurity (07/14/2024)  Housing: Low Risk  (07/14/2024)  Transportation Needs: No Transportation Needs (07/14/2024)  Utilities: Not At Risk (07/14/2024)  Depression (PHQ2-9): Low Risk  (07/10/2022)   Social Connections: Unknown (07/14/2024)  Tobacco Use: Low Risk  (07/02/2024)    Readmission Risk Interventions     No data to display

## 2024-07-16 NOTE — Assessment & Plan Note (Addendum)
-   Ensure twice daily between meals - Dysphagia 2 diet - No indication for SLP/RD given patient comfort care

## 2024-07-16 NOTE — Progress Notes (Signed)
 This chaplain is present for F/U spiritual care.   The Pt. is resting comfortably on his side during the visit with Pt. sister-[name] and family friend. [name] shares her plans to review and visit SNFs for the Pt. at the time of discharge.  This chaplain is available for F/U special care as needed.  Chaplain Leeroy Hummer [phone]

## 2024-07-16 NOTE — Progress Notes (Signed)
     Daily Progress Note Intern Pager: [phone]  Patient name: [name] Medical record number: [id number] Date of birth: 1956-01-16 Age: 68 y.o. Gender: male  Primary Care Provider: Hilma Philis HERO Consultants: Palliative Code Status: DNR/DNI  Pt Overview and Major Events to Date:  - 7/22: Admitted, palliative consulted + GOC conversation with sister had, patient made comfort care  Medical Decision Making:  [name] is a 68 y.o. male admitted with failure to thrive, now comfort care per discussion between palliative and sister Marval (who is HPOA).   Pertinent PMH/PSH includes schizophrenia, developmental delay, hx craniotomy for tumor, Barrett esophagus, dysphagia, seizure disorder, IDA, HTN, HLD.  Assessment & Plan Hospice care Palliative care by specialist Goals of care, counseling/discussion Patient denied from [facility]; will work on finding SNF versus LTC per sister's agreement. - PT eval prior to placement - Comfort care measures - Palliative following, appreciate assistance in patient's care. Failure to thrive in adult - Ensure twice daily between meals - Dysphagia 2 diet - No indication for SLP/RD given patient comfort care Chronic health problem Seizure/mood disorder: Continue Depakote  500 mg twice daily, lamotrigine  125 mg daily, quetiapine  200 mg twice daily Hypothyroidism: Stopping levothyroxine  150 mcg daily in setting of comfort care GERD: Stopping pantoprazole  40 mg in setting of comfort care Constipation: Continue Colace 100 mg twice daily, Senna 1 tablet daily, milk of magnesia 30 mL daily PRN, bisacodyl  10 mg rectally daily PRN   FEN/GI: Dysphagia 2 diet PPx: None (patient comfort care) Dispo: Patient is medically stable for discharge to LTC pending placement.   Subjective:  Patient is doing well today and denies pain when asked; intermittently clear in answers versus with unintelligible speech.  Objective: Temp:  [97.7 F  (36.5 C)-98.9 F (37.2 C)] 98.9 F (37.2 C) (07/23 2011) Pulse Rate:  [80-84] 80 (07/23 2011) Resp:  [16] 16 (07/23 2011) BP: (97)/(58) 97/58 (07/23 2011) SpO2:  [92 %-97 %] 97 % (07/23 2011) Physical Exam: General: Lying on side in hospital bed, no acute distress. Cardiovascular: Regular rate and rhythm, no murmurs/rubs/gallops. Respiratory: Normal work of breathing on room air. Clear to auscultation bilaterally; no wheezes, crackles. Abdomen: Bowel sounds present and normoactive bilaterally. Soft, nondistended, nontender. Extremities: Skin warm, dry. No bilateral lower extremity edema. Neuro: Patient alert, intermittently appropriately responding to questions, intermittently with unintelligible answers.  Laboratory: Most recent CBC Lab Results  Component Value Date   WBC 4.7 07/13/2024   HGB 11.2 (L) 07/13/2024   HCT 33.5 (L) 07/13/2024   MCV 95.7 07/13/2024   PLT 178 07/13/2024   Most recent BMP    Latest Ref Rng & Units 07/13/2024    6:00 PM  BMP  Glucose 70 - 99 mg/dL 897   BUN 8 - 23 mg/dL 35   Creatinine 9.38 - 1.24 mg/dL 8.38   Sodium 864 - 854 mmol/L 138   Potassium 3.5 - 5.1 mmol/L 4.3   Chloride 98 - 111 mmol/L 100   CO2 22 - 32 mmol/L 19   Calcium  8.9 - 10.3 mg/dL 9.5     Larraine Palma, MD 07/16/2024, 7:26 AM  PGY-1, [facility] FPTS Intern pager: [phone], text pages welcome Secure chat group [facility] Teaching Service

## 2024-07-16 NOTE — Therapy (Incomplete)
 OUTPATIENT OCCUPATIONAL THERAPY NEURO EVALUATION  Patient Name: [name] MRN: [id number] DOB:[birth year], 68 y.o., male Today's Date: 07/16/2024  PCP: *** REFERRING PROVIDER: Gayland Lauraine PARAS, NP  END OF SESSION:   Past Medical History:  Diagnosis Date   Acute esophagitis 01/09/2015   Acute hypoxemic respiratory failure (HCC) 10/21/2020   Barrett esophagus    Bowel obstruction (HCC)    DM II (diabetes mellitus, type II), controlled (HCC) 11/13/2020   Dysphagia    Encephalomalacia on imaging study 07/02/2024   Gastric polyps    Gastrointestinal hemorrhage associated with other gastritis    Gastroparesis    GERD (gastroesophageal reflux disease)    GI bleed 01/08/2015   GIB (gastrointestinal bleeding) 01/08/2015   Hemiplegia (HCC) 04/05/2008   Qualifier: Diagnosis of   By: Orlando CMA LEODIS), Stephanie         History of diverticulosis 08/21/2004   Centricity Description: DIVERTICULOSIS, COLON  Qualifier: Diagnosis of   By: Orlando CMA LEODIS), Corean      Centricity Description: DIVERTICULOSIS-COLON  Qualifier: Diagnosis of   By: Ever Riggers, Amy S        History of Esophageal dilation and tortuosity on EGD 11/18/2020   History of Esophageal dysmotility 11/18/2020   Hypercholesteremia    Hypertension    Intellectual disability    Iron deficiency anemia    Loss of weight 07/04/2009   Centricity Description: WEIGHT LOSS  Qualifier: Diagnosis of   By: Drucilla BANANA, Pam      Centricity Description: WEIGHT LOSS-ABNORMAL  Qualifier: Diagnosis of   By: Ever Riggers, Amy S        Malnutrition of moderate degree 07/03/2024   S/P craniotomy 03/09/2021   S/P percutaneous endoscopic gastrostomy (PEG) tube placement (HCC)    S/P repair of paraesophageal hernia 10/26/2020   Schizophrenia (HCC)    Seizure (HCC)    Stroke (HCC)    Syncope 10/21/2020   Upper gastrointestinal bleed    Past Surgical History:  Procedure Laterality Date   BIOPSY  10/21/2020    Procedure: BIOPSY;  Surgeon: Avram Lupita BRAVO, MD;  Location: [facility] ENDOSCOPY;  Service: Endoscopy;;   CRANIOTOMY FOR CYST FENESTRATION     ESOPHAGOGASTRODUODENOSCOPY N/A 01/09/2015   Procedure: ESOPHAGOGASTRODUODENOSCOPY (EGD);  Surgeon: Lamar JONETTA Aho, MD;  Location: [facility] ENDOSCOPY;  Service: Endoscopy;  Laterality: N/A;   ESOPHAGOGASTRODUODENOSCOPY N/A 10/26/2020   Procedure: ESOPHAGOGASTRODUODENOSCOPY (EGD);  Surgeon: Shyrl Linnie KIDD, MD;  Location: [facility] OR;  Service: Thoracic;  Laterality: N/A;   ESOPHAGOGASTRODUODENOSCOPY (EGD) WITH PROPOFOL  N/A 10/21/2020   Procedure: ESOPHAGOGASTRODUODENOSCOPY (EGD) WITH PROPOFOL ;  Surgeon: Avram Lupita BRAVO, MD;  Location: [facility] ENDOSCOPY;  Service: Endoscopy;  Laterality: N/A;   IR GASTR TUBE CONVERT GASTR-JEJ PER W/FL MOD SED  11/07/2020   PEG PLACEMENT N/A 10/26/2020   Procedure: PERCUTANEOUS ENDOSCOPIC GASTROSTOMY (PEG) PLACEMENT;  Surgeon: Shyrl Linnie KIDD, MD;  Location: [facility] OR;  Service: Thoracic;  Laterality: N/A;   XI ROBOTIC ASSISTED HIATAL HERNIA REPAIR N/A 10/26/2020   Procedure: XI ROBOTIC ASSISTED HIATAL HERNIA REPAIR;  Surgeon: Shyrl Linnie KIDD, MD;  Location: [facility] OR;  Service: Thoracic;  Laterality: N/A;   Patient Active Problem List   Diagnosis Date Noted   Hospice care 07/15/2024   Goals of care, counseling/discussion 07/15/2024   Palliative care by specialist 07/15/2024   Failure to thrive in adult 07/14/2024   AKI (acute kidney injury) (HCC) 07/14/2024   Adverse effect of other antipsychotics and neuroleptics, initial encounter 07/04/2024   Malnutrition of moderate degree 07/03/2024  Encephalopathy 07/02/2024   Congenital cognitive impairment 05/15/2024   History of stroke with residual deficit 05/15/2024   AMS (altered mental status)  Weakness 05/14/2024   Chronic health problem 05/14/2024   Polypharmacy 06/26/2023   Mixed hyperlipidemia 05/26/2023   Intellectual developmental delay 05/26/2023   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) 10/26/2021   Seizure disorder (HCC) 10/26/2021   History of Barrett's esophagus 11/18/2020   History of Esophageal dysmotility 11/18/2020   History of Esophageal dilation and tortuosity on EGD 11/18/2020   Dysphagia    Hiatal hernia 10/21/2020   Emesis 07/24/2015   Weight loss, unintentional 07/04/2009   ANEMIA, IRON DEFICIENCY 05/04/2008   Hypothyroidism 04/05/2008   Schizophrenia, unspecified type (HCC) 04/05/2008   Depression 04/05/2008   Essential hypertension 04/05/2008   GERD 04/05/2008   Gastroparesis 04/05/2008   Acute metabolic encephalopathy 04/05/2008   HIATAL HERNIA 04/01/2002    ONSET DATE: 07/01/2024  REFERRING DIAG: G40.909 (ICD-10-CM) - Seizure disorder (HCC) I69.954 (ICD-10-CM) - 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, unspecified cerebrovascular disease type (HCC) R41.82 (ICD-10-CM) - Altered mental status, unspecified altered mental status type F20.9 (ICD-10-CM) - Schizophrenia, unspecified type (HCC)  Note:  History of stroke, seizures, left hemiparesis, general weakness. Work on ADLs  THERAPY DIAG:  No diagnosis found.  Rationale for Evaluation and Treatment: Rehabilitation  SUBJECTIVE:   SUBJECTIVE STATEMENT: *** Pt accompanied by: {accompnied:27141}  PERTINENT HISTORY: ***  PRECAUTIONS: {Therapy precautions:24002}  WEIGHT BEARING RESTRICTIONS: {Yes ***/No:24003}  PAIN:  Are you having pain? {OPRCPAIN:27236}  FALLS: Has patient fallen in last 6 months? {fallsyesno:27318}  LIVING ENVIRONMENT: Lives with: {OPRC lives with:25569::lives with their family} Lives in: {Lives in:25570} Stairs: {opstairs:27293} Has following equipment at home: {Assistive devices:23999}  PLOF: {PLOF:24004}  PATIENT GOALS: ***  OBJECTIVE:  Note: Objective measures were completed at Evaluation unless otherwise noted.  HAND DOMINANCE: {MISC; OT HAND  [phone]  ADLs: Overall ADLs: *** Transfers/ambulation related to ADLs: Eating: *** Grooming: *** UB Dressing: *** LB Dressing: *** Toileting: *** Bathing: *** Tub Shower transfers: *** Equipment: {equipment:25573}  IADLs: Shopping: *** Light housekeeping: *** Meal Prep: *** Community mobility: *** Medication management: *** Financial management: *** Handwriting: {OTWRITTENEXPRESSION:25361}  MOBILITY STATUS: {OTMOBILITY:25360}  POSTURE COMMENTS:  {posture:25561} Sitting balance: {sitting balance:25483}  ACTIVITY TOLERANCE: Activity tolerance: ***  FUNCTIONAL OUTCOME MEASURES: {OTFUNCTIONALMEASURES:27238}  UPPER EXTREMITY ROM:    {AROM/PROM:27142} ROM Right eval Left eval  Shoulder flexion    Shoulder abduction    Shoulder adduction    Shoulder extension    Shoulder internal rotation    Shoulder external rotation    Elbow flexion    Elbow extension    Wrist flexion    Wrist extension    Wrist ulnar deviation    Wrist radial deviation    Wrist pronation    Wrist supination    (Blank rows = not tested)  UPPER EXTREMITY MMT:     MMT Right eval Left eval  Shoulder flexion    Shoulder abduction    Shoulder adduction    Shoulder extension    Shoulder internal rotation    Shoulder external rotation    Middle trapezius    Lower trapezius    Elbow flexion    Elbow extension    Wrist flexion    Wrist extension    Wrist ulnar deviation    Wrist radial deviation    Wrist pronation    Wrist supination    (  Blank rows = not tested)  HAND FUNCTION: {handfunction:27230}  COORDINATION: {otcoordination:27237}  SENSATION: {sensation:27233}  EDEMA: ***  MUSCLE TONE: {UETONE:25567}  COGNITION: Overall cognitive status: {cognition:24006}  VISION: Subjective report: *** Baseline vision: {OTBASELINEVISION:25363} Visual history: {OTVISUALHISTORY:25364}  VISION ASSESSMENT: {visionassessment:27231}  Patient has difficulty with  following activities due to following visual impairments: ***  PERCEPTION: {Perception:25564}  PRAXIS: {Praxis:25565}  OBSERVATIONS: ***                                                                                                                             TREATMENT DATE: ***         PATIENT EDUCATION: Education details: *** Person educated: {Person educated:25204} Education method: {Education Method:25205} Education comprehension: {Education Comprehension:25206}  HOME EXERCISE PROGRAM: ***   GOALS: Goals reviewed with patient? {yes/no:20286}  SHORT TERM GOALS: Target date: ***  *** Baseline: Goal status: INITIAL  2.  *** Baseline:  Goal status: INITIAL  3.  *** Baseline:  Goal status: INITIAL  4.  *** Baseline:  Goal status: INITIAL  5.  *** Baseline:  Goal status: INITIAL  6.  *** Baseline:  Goal status: INITIAL  LONG TERM GOALS: Target date: ***  *** Baseline:  Goal status: INITIAL  2.  *** Baseline:  Goal status: INITIAL  3.  *** Baseline:  Goal status: INITIAL  4.  *** Baseline:  Goal status: INITIAL  5.  *** Baseline:  Goal status: INITIAL  6.  *** Baseline:  Goal status: INITIAL  ASSESSMENT:  CLINICAL IMPRESSION: Patient is a *** y.o. *** who was seen today for occupational therapy evaluation for ***.   PERFORMANCE DEFICITS: in functional skills including {OT physical skills:25468}, cognitive skills including {OT cognitive skills:25469}, and psychosocial skills including {OT psychosocial skills:25470}.   IMPAIRMENTS: are limiting patient from {OT performance deficits:25471}.   CO-MORBIDITIES: {Comorbidities:25485} that affects occupational performance. Patient will benefit from skilled OT to address above impairments and improve overall function.  MODIFICATION OR ASSISTANCE TO COMPLETE EVALUATION: {OT modification:25474}  OT OCCUPATIONAL PROFILE AND HISTORY: {OT PROFILE AND HISTORY:25484}  CLINICAL DECISION  MAKING: {OT CDM:25475}  REHAB POTENTIAL: {rehabpotential:25112}  EVALUATION COMPLEXITY: {Evaluation complexity:25115}    PLAN:  OT FREQUENCY: {rehab frequency:25116}  OT DURATION: {rehab duration:25117}  PLANNED INTERVENTIONS: {OT Interventions:25467}  RECOMMENDED OTHER SERVICES: ***  CONSULTED AND AGREED WITH PLAN OF CARE: {ENR:74513}  PLAN FOR NEXT SESSION: ***   Burnard JINNY Roads, OT 07/16/2024, 8:05 AM

## 2024-07-16 NOTE — Assessment & Plan Note (Addendum)
 Seizure/mood disorder: Continue Depakote  500 mg twice daily, lamotrigine  125 mg daily, quetiapine  200 mg twice daily Hypothyroidism: Stopping levothyroxine  150 mcg daily in setting of comfort care GERD: Stopping pantoprazole  40 mg in setting of comfort care Constipation: Continue Colace 100 mg twice daily, Senna 1 tablet daily, milk of magnesia 30 mL daily PRN, bisacodyl  10 mg rectally daily PRN

## 2024-07-16 NOTE — TOC PASRR Note (Signed)
To Whom It May Concern:  Please be advised that the above-named patient will require a short-term nursing home stay - anticipated 30 days or less for rehabilitation and strengthening.  The plan is for return home.  

## 2024-07-16 NOTE — Progress Notes (Signed)
 [facility] 5N16 [facility]  Hospice hospital liaison note   Received request from [facility] for family interest in hospice inpatient unit. Met with patient's sister Marval at bedside with patient today.    Chart reviewed by hospice physician and at this time patient does not meet criteria for hospice inpatient unit.    He is appropriate for hospice services in the home or LTC facility and we would be happy to reassess for inpatient unit appropriateness at a later time if requested.    Thank you for the opportunity to participate in this patient's care.    Greig Basket, BSN, RN Hospice hospital liaison [phone]

## 2024-07-17 ENCOUNTER — Telehealth: Payer: Self-pay | Admitting: Neurology

## 2024-07-17 DIAGNOSIS — Z515 Encounter for palliative care: Secondary | ICD-10-CM | POA: Diagnosis not present

## 2024-07-17 DIAGNOSIS — Z7189 Other specified counseling: Secondary | ICD-10-CM | POA: Diagnosis not present

## 2024-07-17 DIAGNOSIS — R627 Adult failure to thrive: Secondary | ICD-10-CM | POA: Diagnosis not present

## 2024-07-17 DIAGNOSIS — N179 Acute kidney failure, unspecified: Secondary | ICD-10-CM | POA: Diagnosis not present

## 2024-07-17 NOTE — TOC Progression Note (Addendum)
 Transition of Care [facility]) - Progression Note    Patient Details  Name: [name] MRN: [id number] Date of Birth: 05/24/1956  [facility] of [facility]) CM/SW Contact  Gwenn Frieze [place], [place] Phone Number: 07/17/2024, 1:17 PM  Clinical Narrative:  Provided pt's HCPOA/sister [name] with current SNF offers. She will review and update SW with facility choice. HCPOA aware plan is for pt to dc to SNF for rehab with palliative following under Medicare benefits and transition to hospice and LTC at [facility].  Pt's PASRR submitted along with requested documentation, number remains pending. Anticipate dc to SNF Monday pending PASRR number and bed acceptance.   Frieze Gwenn, MSW, LCSW [phone] (coverage)      Expected Discharge Plan: Skilled Nursing Facility Barriers to Discharge: Continued Medical Work up, SNF Pending bed offer               Expected Discharge Plan and Services In-house Referral: Clinical Social Work Discharge Planning Services: CM Consult   Living arrangements for the past 2 months: Group Home                                       Social Drivers of Health (SDOH) Interventions SDOH Screenings   Food Insecurity: No Food Insecurity (07/14/2024)  Housing: Low Risk  (07/14/2024)  Transportation Needs: No Transportation Needs (07/14/2024)  Utilities: Not At Risk (07/14/2024)  Depression (PHQ2-9): Low Risk  (07/10/2022)  Social Connections: Unknown (07/14/2024)  Tobacco Use: Low Risk  (07/02/2024)    Readmission Risk Interventions     No data to display

## 2024-07-17 NOTE — Progress Notes (Signed)
 Pt refused all po medications. Pt turned his head and said no, I do not want them. This RN explained the reason for the medications and that he had the same ones last night. RN explained how he needed his seizure medications, but patient still refused. Pt was clinching his mouth shut and turning his head. This RN tried for several minutes talking with patient, but patient kept his head turned. RN documented refusal.   Bari HERO [facility]

## 2024-07-17 NOTE — Progress Notes (Signed)
 [name] [phone] - [facility] Liaison Note:  Notified by [facility] manager of patient/family request for [facility] Palliative services at home after discharge.   Please call with any hospice or outpatient palliative care related questions.   Thank you for the opportunity to participate in this patient's care.   Elouise Husband, BSN, RN, OCN [employer] [phone]

## 2024-07-17 NOTE — Assessment & Plan Note (Addendum)
 Initially refused meds overnight but family was able to help him take them.  Waiting for placement to LTAC vs SNF; previously denied from inpatient hospice services. - PT recommending SNF/care facility - Comfort care measures - Palliative following

## 2024-07-17 NOTE — Assessment & Plan Note (Addendum)
 Patient waiting for placement to LTAC vs SNF; previously denied from [facility] as not expect to have < 2wk life left. - PT recommending SNF/care facility - Comfort care measures - Palliative following, appreciate assistance in patient's care.

## 2024-07-17 NOTE — Assessment & Plan Note (Signed)
-   Ensure twice daily between meals - Dysphagia 2 diet - No indication for SLP/RD given patient comfort care

## 2024-07-17 NOTE — Progress Notes (Signed)
 Daily Progress Note   Patient Name: [name]       Date: 07/17/2024 DOB: [birth year]  Age: 68 y.o. MRN#: [id number] Attending Physician: Rosalynn Camie CROME, MD Primary Care Physician: Hilma Philis HERO Admit Date: 07/13/2024  Reason for Consultation/Follow-up: Establishing goals of care  Subjective: Earlier today, we visited the patient at his bedside. He appears frail but  not in any form of acute distress.  I met with his sister, who is also his HCPOA, at the bedside. She reports that the patient is doing about the same as in previous days and ate minimally for lunch today. She mentioned that she and the case manager/TOC are exploring skilled nursing facilities for possible placement. The goals of care remain unchanged, focusing on full comfort measures with no further escalation in the level of care necessary. We discussed and completed the MOST form.   Length of Stay: 1  Current Medications: Scheduled Meds:   divalproex   500 mg Oral Q12H   feeding supplement  237 mL Oral BID BM   lamoTRIgine   125 mg Oral QHS   QUEtiapine   200 mg Oral BID   senna  1 tablet Oral Daily    Continuous Infusions:   PRN Meds: acetaminophen  **OR** acetaminophen , antiseptic oral rinse, artificial tears, bisacodyl , fentaNYL  (SUBLIMAZE ) injection, glycopyrrolate  **OR** glycopyrrolate  **OR** glycopyrrolate , haloperidol  **OR** haloperidol  **OR** haloperidol  lactate, magnesium  hydroxide, ondansetron   Physical Exam Vitals and nursing note reviewed.  Constitutional:      Appearance: He is ill-appearing.  Cardiovascular:     Rate and Rhythm: Normal rate.  Pulmonary:     Effort: Pulmonary effort is normal.  Genitourinary:    Comments: External male cath in place.  Musculoskeletal:     Comments:  Generalized weakness   Skin:    General: Skin is warm and dry.  Neurological:     Mental Status: Mental status is at baseline. He is disoriented.  Psychiatric:     Comments: UTA              Vital Signs: BP (!) 97/59 (BP Location: Right Arm)   Pulse 61   Temp 98 F (36.7 C) (Oral)   Resp 16   Ht 5' 8 (1.727 m)   Wt 50.7 kg   SpO2 97%   BMI 17.00 kg/m  SpO2: SpO2: 97 %  O2 Device: O2 Device: Room Air O2 Flow Rate:    Intake/output summary:  Intake/Output Summary (Last 24 hours) at 07/17/2024 1600 Last data filed at 07/17/2024 9177 Gross per 24 hour  Intake 120 ml  Output --  Net 120 ml   LBM: Last BM Date : 07/16/24 Baseline Weight: Weight: 50.7 kg Most recent weight: Weight: 50.7 kg       Palliative Assessment/Data:      Patient Active Problem List   Diagnosis Date Noted   Hospice care 07/15/2024   Goals of care, counseling/discussion 07/15/2024   Palliative care by specialist 07/15/2024   Failure to thrive in adult 07/14/2024   AKI (acute kidney injury) (HCC) 07/14/2024   Adverse effect of other antipsychotics and neuroleptics, initial encounter 07/04/2024   Malnutrition of moderate degree 07/03/2024   Encephalopathy 07/02/2024   Congenital cognitive impairment 05/15/2024   History of stroke with residual deficit 05/15/2024   AMS (altered mental status)  Weakness 05/14/2024   Chronic health problem 05/14/2024   Polypharmacy 06/26/2023   Mixed hyperlipidemia 05/26/2023   Intellectual developmental delay 05/26/2023   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) 10/26/2021   Seizure disorder (HCC) 10/26/2021   History of Barrett's esophagus 11/18/2020   History of Esophageal dysmotility 11/18/2020   History of Esophageal dilation and tortuosity on EGD 11/18/2020   Dysphagia    Hiatal hernia 10/21/2020   Emesis 07/24/2015   Weight loss, unintentional 07/04/2009   ANEMIA, IRON DEFICIENCY 05/04/2008    Hypothyroidism 04/05/2008   Schizophrenia, unspecified type (HCC) 04/05/2008   Depression 04/05/2008   Essential hypertension 04/05/2008   GERD 04/05/2008   Gastroparesis 04/05/2008   Acute metabolic encephalopathy 04/05/2008   HIATAL HERNIA 04/01/2002    Palliative Care Assessment & Plan   Patient Profile: 68 y.o. male with past medical history of developmental delay 2/2 prior craniotomy, schizophrenia, HTN, HLD, IDA and seizure disorder admitted on 07/13/2024 with poor oral intake for 3 weeks, likely in the setting of adult failure to thrive.   Assessment: Earlier today, we visited the patient at his bedside. He appears frail but is more awake than in previous days and is not in any form of acute distress.  We met with his sister, who is also his HCPOA, at the bedside. She reports that the patient is doing about the same as in previous days and ate minimally for lunch today. She mentioned that she and the case manager are exploring facilities for possible placement. The goals of care remain unchanged, focusing on full comfort measures with no further escalation in the level of care necessary. We discussed and completed the MOST form, detailing the measures to employ and not to employ, aligning with the patient's goals of care.  Recommendations/Plan: Code Status: DNR-Comfort. No further escalation in level of care necessary.  MOST form completed today Continue to provide holistic palliative support. Continue with current pharmacologic symptom measures: Start Fentanyl  25 mcg Q1 PRN for pain, glycopyrrolate  0.2mg  Q4 PRN for excessive secretions, haloperidol  PRN for agitation.  Provided and continue psycho-social emotional support. Continue with holistic palliative support.    Goals of Care and Additional Recommendations: Full comfort measures   Code Status:    Code Status Orders  (From admission, onward)           Start     Ordered   07/14/24 1710  Do not attempt  resuscitation (DNR) - Comfort care  Continuous  Question Answer Comment  If patient has no pulse and is not breathing Do Not Attempt Resuscitation   In Pre-Arrest Conditions (Patient Is Breathing and Has a Pulse) Provide comfort measures. Relieve any mechanical airway obstruction. Avoid transfer unless required for comfort.   Consent: Discussion documented in EHR or advanced directives reviewed      07/14/24 1710           Code Status History     Date Active Date Inactive Code Status Order ID Comments User Context   07/14/2024 0101 07/14/2024 1710 Limited: Do not attempt resuscitation (DNR) -DNR-LIMITED -Do Not Intubate/DNI  [id number]  Manon Jester, DO ED   07/02/2024 1912 07/06/2024 2039 Full Code [id number]  Toma, Dimitry, MD ED   05/14/2024 1703 05/15/2024 1829 Full Code [id number]  Lafe Domino, DO ED   06/05/2023 0746 06/06/2023 2250 Full Code [id number]  Alto Isaiah CROME, NP ED   06/05/2023 0648 06/05/2023 0746 Full Code [id number]  Alto Isaiah CROME, NP ED   05/25/2023 1935 05/26/2023 2230 Full Code [id number]  Laurita Cort DASEN, MD ED   04/09/2023 1354 04/15/2023 2052 Full Code [id number]  Caleen Burgess Campbell, MD ED   11/15/2020 1524 12/07/2020 2130 Full Code [id number]  Pegge Toribio PARAS, PA-C Inpatient   11/15/2020 1524 11/15/2020 1524 Full Code [id number]  Pegge Toribio PARAS, PA-C Inpatient   10/21/2020 0234 11/15/2020 1505 Full Code [id number]  Alfornia Madison, MD ED   09/05/2020 1522 09/06/2020 2251 Full Code [id number]  Henriette Mora, MD ED   07/24/2015 0158 07/27/2015 1951 Full Code [id number]  Celinda Alm Lot, MD Inpatient   01/08/2015 2102 01/11/2015 1750 Full Code [id number]  Hilma Rankins, MD Inpatient      Advance Directive Documentation    Flowsheet Row Most Recent Value  Type of Advance Directive Healthcare Power of Attorney  Pre-existing out of facility DNR order (yellow form or pink MOST form) --  MOST Form in Place? --    Prognosis:  Remains poor given the overall  decline with ADLs and reduced intake.   Discharge Planning: Plan for SNF with outpatient Hospice service  Care plan was discussed with sister/POA [name] and Treatment team.   Thank you for allowing the Palliative Medicine Team to assist in the care of this patient.   Time Spent:   I spent  35 minutes in the care of the patient today in the above activities and documenting the encounter.     Kathlyne JULIANNA Tracie Mickey, NP  Please contact Palliative Medicine Team phone at [phone] for questions and concerns.

## 2024-07-17 NOTE — Progress Notes (Signed)
     Daily Progress Note Intern Pager: [phone]  Patient name: [name] Medical record number: [id number] Date of birth: 20-Jul-1956 Age: 68 y.o. Gender: male  Primary Care Provider: Hilma Philis HERO Consultants: Palliative Code Status: DNR/DNI  Pt Overview and Major Events to Date:  - 7/22: Admitted, palliative consulted + GOC conversation with sister had, patient made comfort care  Medical Decision Making:  [name] is a 68 y.o. male admitted with failure to thrive, now made comfort care per discussion between palliative and sister, Marval (who is HPOA).   Pertinent PMH/PSH includes schizophrenia, developmental delay, hx craniotomy for tumor, Barrett esophagus, dysphagia, seizure disorder, IDA, HTN, HLD.  Assessment & Plan Hospice care Palliative care by specialist Goals of care, counseling/discussion Patient waiting for placement to LTAC vs SNF; previously denied from [facility] as not expect to have < 2wk life left. - PT recommending SNF/care facility - Comfort care measures - Palliative following, appreciate assistance in patient's care. Failure to thrive in adult - Ensure twice daily between meals - Dysphagia 2 diet - No indication for SLP/RD given patient comfort care Chronic health problem Seizure/mood disorder: Continue Depakote  500 mg twice daily, lamotrigine  125 mg daily, quetiapine  200 mg twice daily Hypothyroidism: Stopping levothyroxine  150 mcg daily in setting of comfort care GERD: Stopping pantoprazole  40 mg in setting of comfort care Constipation: Continue Colace 100 mg twice daily, Senna 1 tablet daily, milk of magnesia 30 mL daily PRN, bisacodyl  10 mg rectally daily PRN  FEN/GI: Dysphagia 2 diet PPx: None (patient comfort care) Dispo:SNF vs LTAC pending placement; patient medically stable for discharge.  Subjective:  Patient reports eye pain today, but when asked if wanting eye drops, strongly denied. No other concerns. No  abdominal pain.  Objective: Temp:  [98.2 F (36.8 C)-98.3 F (36.8 C)] 98.3 F (36.8 C) (07/24 1943) Pulse Rate:  [67-73] 73 (07/24 1943) Resp:  [14-16] 16 (07/24 1943) BP: (93-100)/(60) 93/60 (07/24 1943) SpO2:  [97 %-98 %] 97 % (07/24 1943) Physical Exam: General: Patient lying down in bed, no acute distress. Cardiovascular: Regular rate and rhythm, no murmurs/rubs/gallops. Respiratory: Normal work of breathing on room air. Clear to auscultation bilaterally; no wheezes, crackles. Abdomen: Bowel sounds present and normoactive bilaterally. Soft, nondistended, nontender. Extremities: Skin warm, dry. No bilateral lower extremity edema. Neuro: Alert and largely appropriately responding to questions. Occasional unintelligible speech.  Laboratory: Most recent CBC Lab Results  Component Value Date   WBC 4.7 07/13/2024   HGB 11.2 (L) 07/13/2024   HCT 33.5 (L) 07/13/2024   MCV 95.7 07/13/2024   PLT 178 07/13/2024   Most recent BMP    Latest Ref Rng & Units 07/13/2024    6:00 PM  BMP  Glucose 70 - 99 mg/dL 897   BUN 8 - 23 mg/dL 35   Creatinine 9.38 - 1.24 mg/dL 8.38   Sodium 864 - 854 mmol/L 138   Potassium 3.5 - 5.1 mmol/L 4.3   Chloride 98 - 111 mmol/L 100   CO2 22 - 32 mmol/L 19   Calcium  8.9 - 10.3 mg/dL 9.5     Larraine Palma, MD 07/17/2024, 7:10 AM  PGY-1, [facility] FPTS Intern pager: [phone], text pages welcome Secure chat group [facility] Teaching Service

## 2024-07-17 NOTE — Assessment & Plan Note (Signed)
 Seizure/mood disorder: Continue home Depakote  500 mg twice daily, lamotrigine  125 mg daily, quetiapine  200 mg twice daily Hypothyroidism: Hold home levothyroxine  150 mcg daily in setting of comfort care GERD: Hold home pantoprazole  40 mg in setting of comfort care Constipation: Continue Colace 100 mg twice daily, Senna 1 tablet daily, milk of magnesia 30 mL daily PRN, bisacodyl  10 mg rectally daily PRN

## 2024-07-17 NOTE — Telephone Encounter (Signed)
 Shanda @ [facility] states pt is in hospital, request to cx

## 2024-07-17 NOTE — Evaluation (Signed)
 Physical Therapy Evaluation Patient Details Name: [name] MRN: [id number] DOB: 01/01/1956 Today's Date: 07/17/2024  History of Present Illness  [name] is a 68 y.o. male admitted 07/13/24 with failure to thrive. 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   Clinical Impression  Pt admitted with above diagnosis. Examination limited by impaired cognition and behavior. Pt is a poor historian and family was not available. Per chart review, pt is from a Group Home and primarily uses a manual w/c for mobility. He has a PCA 24/7 that provides supervision and assist. Pt currently with functional limitations due to the deficits listed below (see PT Problem List). He required modA for bed mobility and refused OOB mobility. Unable to transfer despite max education, max encouragement, and RN present to provided +2 assist if needed. Pt will benefit from acute skilled PT to maximize his independence and safety with mobility to allow discharge. Recommend post-acure rehab <3 hours/day of therapy.     If plan is discharge home, recommend the following: A lot of help with walking and/or transfers;A lot of help with bathing/dressing/bathroom;Assistance with cooking/housework;Assistance with feeding;Direct supervision/assist for medications management;Help with stairs or ramp for entrance;Assist for transportation;Direct supervision/assist for financial management;Supervision due to cognitive status   Can travel by private vehicle   No    Equipment Recommendations None recommended by PT  Recommendations for Other Services       Functional Status Assessment Patient has had a recent decline in their functional status and/or demonstrates limited ability to make significant improvements in function in a reasonable  and predictable amount of time     Precautions / Restrictions Precautions Precautions: Fall Recall of Precautions/Restrictions: Impaired Restrictions Weight Bearing Restrictions Per Provider Order: No      Mobility  Bed Mobility Overal bed mobility: Needs Assistance Bed Mobility: Supine to Sit, Sit to Supine     Supine to sit: Mod assist Sit to supine: Mod assist   General bed mobility comments: Pt sat up on L side of bed with increased time. Assist to bring BLE off EOB, elevate trunk, and scoot hips fwd til feet flat with use of bed pad. Returned to supine with assist to bring BLE back into bed and turn trunk so pt did not hit bed rail. +2 assist to reposition using bed features and bed pad.    Transfers Overall transfer level: Needs assistance                 General transfer comment: Pt refused to complete transfer. He was actively attempting to return to supine.    Ambulation/Gait                  Stairs            Wheelchair Mobility     Tilt Bed    Modified Rankin (Stroke Patients Only)       Balance Overall balance assessment: Needs assistance Sitting-balance support: Feet supported, Bilateral upper extremity supported Sitting balance-Leahy Scale: Poor Sitting balance - Comments: Pt sat EOB with modA to maintain upright posture. Unsure if his lean was d/t desire to return to bed, poor core endurance, weakness, or unsteadiness. Postural control: Posterior lean  Pertinent Vitals/Pain Pain Assessment Pain Assessment: PAINAD Breathing: normal Negative Vocalization: none Facial Expression: smiling or inexpressive Body Language: relaxed Consolability: no need to console PAINAD Score: 0    Home Living Family/patient expects to be discharged to:: Skilled nursing facility Living Arrangements: Group Home Available Help at Discharge: Personal care attendant;Available 24 hours/day Type of  Home: Group Home Home Access: Level entry       Home Layout: One level Home Equipment: Agricultural consultant (2 wheels);Wheelchair - manual Additional Comments: Above information acquired from chart review    Prior Function Prior Level of Function : Patient poor historian/Family not available;Needs assist  Cognitive Assist : Mobility (cognitive);ADLs (cognitive) Mobility (Cognitive): Intermittent cues ADLs (Cognitive): Intermittent cues Physical Assist : ADLs (physical)   ADLs (physical): Bathing;IADLs Mobility Comments: Pt reports using manual w/c to get around and states he can transfer on his own. Pt denies fall history ADLs Comments: Pt reports he can complete most ADLs. Needs assist for showering and all IADLs.     Extremity/Trunk Assessment   Upper Extremity Assessment Upper Extremity Assessment: Difficult to assess due to impaired cognition;LUE deficits/detail;Generalized weakness;Right hand dominant (Pt unable to follow commands for MMT) LUE Deficits / Details: Hx of residual weakness secondary to CVA. Pt's LUE in contracted in elbow flex and wrist flex. He actively resisted against PROM and will use RUE to aid in lifting/moving arm; most likely baseline    Lower Extremity Assessment Lower Extremity Assessment: Difficult to assess due to impaired cognition;LLE deficits/detail;Generalized weakness (Pt unable to follow commands for MMT) LLE Deficits / Details: Hx of residual weakness secondary to CVA. Limited active ankle DF.    Cervical / Trunk Assessment Cervical / Trunk Assessment: Other exceptions Cervical / Trunk Exceptions: Education officer, environmental   Communication Communication: Impaired Factors Affecting Communication: Reduced clarity of speech;Difficulty expressing self    Cognition Arousal: Alert Behavior During Therapy: Flat affect   PT - Cognitive impairments: History of cognitive impairments, No family/caregiver present to determine baseline, Orientation    Orientation impairments: Place, Time, Situation                   PT - Cognition Comments: Pt A,O to self only. Attempted to reorient pt with no carryover. Limited insight to current situaton. Pt confused and easily distracted. Max encouragement to participate in session. Following commands: Impaired Following commands impaired: Follows one step commands inconsistently     Cueing Cueing Techniques: Verbal cues, Tactile cues, Visual cues     General Comments      Exercises     Assessment/Plan    PT Assessment Patient needs continued PT services  PT Problem List Decreased strength;Decreased range of motion;Decreased activity tolerance;Decreased balance;Decreased mobility;Decreased cognition;Decreased knowledge of use of DME;Decreased safety awareness       PT Treatment Interventions DME instruction;Gait training;Functional mobility training;Therapeutic activities;Therapeutic exercise;Balance training;Neuromuscular re-education;Patient/family education;Wheelchair mobility training    PT Goals (Current goals can be found in the Care Plan section)  Acute Rehab PT Goals PT Goal Formulation: Patient unable to participate in goal setting Time For Goal Achievement: 07/31/24 Potential to Achieve Goals: Fair    Frequency Min 2X/week     Co-evaluation               AM-PAC PT 6 Clicks Mobility  Outcome Measure Help needed turning from your back to your side while in a flat bed without using bedrails?: A Lot Help needed moving from lying on your back to sitting on the side of a  flat bed without using bedrails?: A Lot Help needed moving to and from a bed to a chair (including a wheelchair)?: A Lot Help needed standing up from a chair using your arms (e.g., wheelchair or bedside chair)?: A Lot Help needed to walk in hospital room?: Total Help needed climbing 3-5 steps with a railing? : Total 6 Click Score: 10    End of Session   Activity Tolerance: Other (comment)  (Treatment limited secondary to pt's willingess to participate.) Patient left: in bed;with call bell/phone within reach;with bed alarm set Nurse Communication: Mobility status PT Visit Diagnosis: Adult, failure to thrive (R62.7);Muscle weakness (generalized) (M62.81);Difficulty in walking, not elsewhere classified (R26.2);Unsteadiness on feet (R26.81);Other abnormalities of gait and mobility (R26.89);Hemiplegia and hemiparesis Hemiplegia - Right/Left: Left Hemiplegia - dominant/non-dominant: Non-dominant Hemiplegia - caused by: Cerebral infarction    Time: 9261-9245 PT Time Calculation (min) (ACUTE ONLY): 16 min   Charges:   PT Evaluation $PT Eval Moderate Complexity: 1 Mod   PT General Charges $$ ACUTE PT VISIT: 1 Visit         Randall SAUNDERS, PT, DPT Acute Rehabilitation Services Office: [phone] Secure Chat Preferred  Delon CHRISTELLA Callander 07/17/2024, 8:29 AM

## 2024-07-17 NOTE — Progress Notes (Signed)
 Sister called at this time checking on patient. RN explained to sister that the patient had refused all of his meds earlier for this RN. Sister was concerned because some of those medications are important (seizure meds) and wanted to speak with the patient and see if that would help. This RN had sister speak on the phone with the patient and he took all of his meds very easily. All meds have now been given and patient is resting comfortably in the bed at this time. RN will continue to monitor pt.   Bari HERO Lasean Rahming

## 2024-07-17 NOTE — Progress Notes (Addendum)
     Daily Progress Note Intern Pager: [phone]  Patient name: [name] Medical record number: [id number] Date of birth: 1956/12/03 Age: 68 y.o. Gender: male  Primary Care Provider: Hilma Philis HERO Consultants: Palliative care Code Status: DNR-comfort  Pt Overview and Major Events to Date:  7/22: Admitted, transition to comfort care  Assessment and Plan: [name] is a 68 y.o. male with PMHx schizophrenia, developmental delay 2/2 craniotomy for tumor, Barrett esophagus, dysphagia, seizure disorder, IDA, HTN, HLD. admitted with failure to thrive, now made comfort care per discussion between palliative and sister, [name] (who is HPOA).  Assessment & Plan Hospice care Palliative care by specialist Goals of care, counseling/discussion Initially refused meds overnight but family was able to help him take them.  Waiting for placement to LTAC vs SNF; previously denied from inpatient hospice services. - PT recommending SNF/care facility - Comfort care measures - Palliative following Failure to thrive in adult - Ensure twice daily between meals - Dysphagia 2 diet - No indication for SLP/RD given patient comfort care Chronic health problem Seizure/mood disorder: Continue home Depakote  500 mg twice daily, lamotrigine  125 mg daily, quetiapine  200 mg twice daily Hypothyroidism: Hold home levothyroxine  150 mcg daily in setting of comfort care GERD: Hold home pantoprazole  40 mg in setting of comfort care Constipation: Continue Colace 100 mg twice daily, Senna 1 tablet daily, milk of magnesia 30 mL daily PRN, bisacodyl  10 mg rectally daily PRN   FEN/GI: Dysphagia 2 PPx: None Dispo: Placement with hospice services  Subjective:  Resting comfortably, no acute concerns.  Objective: Temp:  [97.5 F (36.4 C)-98 F (36.7 C)] 97.5 F (36.4 C) (07/25 2005) Pulse Rate:  [58-61] 58 (07/25 2005) Resp:  [16] 16 (07/25 2005) BP: (97-98)/(58-59) 98/58 (07/25 2005) SpO2:   [97 %-98 %] 98 % (07/25 2005) Physical Exam: General: Elderly male lying in bed, NAD Cardiovascular: RRR Respiratory: Normal work of breathing on room air.  CTAB Abdomen: Soft, nondistended Extremities: No peripheral edema  Laboratory: Most recent CBC Lab Results  Component Value Date   WBC 4.7 07/13/2024   HGB 11.2 (L) 07/13/2024   HCT 33.5 (L) 07/13/2024   MCV 95.7 07/13/2024   PLT 178 07/13/2024   Most recent BMP    Latest Ref Rng & Units 07/13/2024    6:00 PM  BMP  Glucose 70 - 99 mg/dL 897   BUN 8 - 23 mg/dL 35   Creatinine 9.38 - 1.24 mg/dL 8.38   Sodium 864 - 854 mmol/L 138   Potassium 3.5 - 5.1 mmol/L 4.3   Chloride 98 - 111 mmol/L 100   CO2 22 - 32 mmol/L 19   Calcium  8.9 - 10.3 mg/dL 9.5     Other pertinent labs: None  Imaging/Diagnostic Tests: None in the past 24 hours   Theophilus Pagan, MD 07/17/2024, 11:53 PM  PGY-3, [facility] FPTS Intern pager: [phone], text pages welcome Secure chat group [facility] Teaching Service

## 2024-07-17 NOTE — Plan of Care (Signed)
   Problem: Activity: Goal: Risk for activity intolerance will decrease Outcome: Progressing   Problem: Nutrition: Goal: Adequate nutrition will be maintained Outcome: Progressing   Problem: Coping: Goal: Level of anxiety will decrease Outcome: Progressing

## 2024-07-17 NOTE — Assessment & Plan Note (Signed)
 Seizure/mood disorder: Continue Depakote  500 mg twice daily, lamotrigine  125 mg daily, quetiapine  200 mg twice daily Hypothyroidism: Stopping levothyroxine  150 mcg daily in setting of comfort care GERD: Stopping pantoprazole  40 mg in setting of comfort care Constipation: Continue Colace 100 mg twice daily, Senna 1 tablet daily, milk of magnesia 30 mL daily PRN, bisacodyl  10 mg rectally daily PRN

## 2024-07-18 DIAGNOSIS — R627 Adult failure to thrive: Secondary | ICD-10-CM | POA: Diagnosis not present

## 2024-07-18 DIAGNOSIS — Z789 Other specified health status: Secondary | ICD-10-CM

## 2024-07-18 NOTE — Plan of Care (Signed)
   Problem: Education: Goal: Knowledge of General Education information will improve Description Including pain rating scale, medication(s)/side effects and non-pharmacologic comfort measures Outcome: Progressing

## 2024-07-18 NOTE — TOC Progression Note (Signed)
 Transition of Care [facility]) - Progression Note    Patient Details  Name: [name] MRN: [id number] Date of Birth: Nov 30, 1956  [facility]) CM/SW Contact  Hartley KATHEE Robertson, LCSWA Phone Number: 07/18/2024, 12:06 PM  Clinical Narrative:     CSW spoke with pt's sister Marval, she states she should have a decision on SNF for pt by tomorrow, ICM will continue to follow.   Expected Discharge Plan: Skilled Nursing Facility Barriers to Discharge: Continued Medical Work up, SNF Pending bed offer               Expected Discharge Plan and Services In-house Referral: Clinical Social Work Discharge Planning Services: CM Consult   Living arrangements for the past 2 months: Group Home                                       Social Drivers of Health (SDOH) Interventions SDOH Screenings   Food Insecurity: No Food Insecurity (07/14/2024)  Housing: Low Risk  (07/14/2024)  Transportation Needs: No Transportation Needs (07/14/2024)  Utilities: Not At Risk (07/14/2024)  Depression (PHQ2-9): Low Risk  (07/10/2022)  Social Connections: Unknown (07/14/2024)  Tobacco Use: Low Risk  (07/02/2024)    Readmission Risk Interventions     No data to display

## 2024-07-19 DIAGNOSIS — Z515 Encounter for palliative care: Secondary | ICD-10-CM | POA: Diagnosis not present

## 2024-07-19 DIAGNOSIS — R627 Adult failure to thrive: Secondary | ICD-10-CM | POA: Diagnosis not present

## 2024-07-19 NOTE — Assessment & Plan Note (Signed)
-   Ensure twice daily between meals - Dysphagia 2 diet - No indication for SLP/RD given patient comfort care

## 2024-07-19 NOTE — Progress Notes (Signed)
     Daily Progress Note Intern Pager: [phone]  Patient name: [name] Medical record number: [id number] Date of birth: Dec 17, 1956 Age: 68 y.o. Gender: male  Primary Care Provider: Hilma Philis HERO Consultants: Palliative Code Status: DNR, comfort care  Pt Overview and Major Events to Date:  7/22 admitted, transition to comfort care  Medical Decision Making:  JIMMEY HENGEL is a 68 y.o. male admitted with failure to thrive, now comfort care per discussion between palliative medicine and sister Marval (H [place]). 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  Assessment & Plan Hospice care Palliative care by specialist Goals of care, counseling/discussion Waiting for placement; previously denied from inpatient hospice services. - PT recommending SNF/care facility - Comfort care measures - Palliative following Failure to thrive in adult - Ensure twice daily between meals - Dysphagia 2 diet - No indication for SLP/RD given patient comfort care Chronic health problem Seizure/mood disorder: Continue home Depakote  500 mg twice daily, lamotrigine  125 mg daily, quetiapine  200 mg twice daily Hypothyroidism: Hold home levothyroxine  150 mcg daily in setting of comfort care GERD: Hold home pantoprazole  40 mg in setting of comfort care Constipation: Senna 1 tablet daily, milk of magnesia 30 mL daily PRN, bisacodyl  10 mg rectally daily PRN     FEN/GI: Dysphagia 2 PPx: None Dispo: Pending placement, possibly 7/28  Subjective:  NAEON denies concerns  Objective: Temp:  [97.5 F (36.4 C)-98.6 F (37 C)] 98.6 F (37 C) (07/26 1950) Pulse Rate:  [58-62] 62 (07/26 1950) Resp:  [16-17] 17 (07/26 1950) BP: (90-98)/(54-63) 90/57 (07/26 1950) SpO2:  [92 %-99 %] 99 % (07/26 1950) Physical Exam: General: NAD, awake Cardiovascular: RRR no murmur Respiratory: CTAB normal  WOB Abdomen: Soft NTND  Laboratory: Most recent CBC Lab Results  Component Value Date   WBC 4.7 07/13/2024   HGB 11.2 (L) 07/13/2024   HCT 33.5 (L) 07/13/2024   MCV 95.7 07/13/2024   PLT 178 07/13/2024   Most recent BMP    Latest Ref Rng & Units 07/13/2024    6:00 PM  BMP  Glucose 70 - 99 mg/dL 897   BUN 8 - 23 mg/dL 35   Creatinine 9.38 - 1.24 mg/dL 8.38   Sodium 864 - 854 mmol/L 138   Potassium 3.5 - 5.1 mmol/L 4.3   Chloride 98 - 111 mmol/L 100   CO2 22 - 32 mmol/L 19   Calcium  8.9 - 10.3 mg/dL 9.5     Romelle Booty, MD 07/19/2024, 1:37 AM  PGY-3, [facility] FPTS Intern pager: [phone], text pages welcome Secure chat group [facility] Teaching Service

## 2024-07-19 NOTE — Assessment & Plan Note (Signed)
 Waiting for placement; previously denied from inpatient hospice services. - PT recommending SNF/care facility - Comfort care measures - Palliative following

## 2024-07-19 NOTE — TOC Progression Note (Addendum)
 Transition of Care [facility]) - Progression Note    Patient Details  Name: [name] MRN: [id number] Date of Birth: 1956/08/04  [facility] CM/SW Contact  Bridget Cordella Simmonds, LCSW Phone Number: 07/19/2024, 12:40 PM  Clinical Narrative:   CSW spoke with pt sister [name] by phone.  She reports she is not willing to accept offers at any of the SNFs that have offered beds currently due to low ratings.  She requested that referral be sent to [facility] and also requested responses from [facility] and [facility].  She asked if pt would be able to just remain at [facility] until he meets criteria for residential hospice and CSW informed her that this would not be possible.  CSW reached out to [facility], [facility], [facility] and requested they review referral.    PASSR approved: [id number] E, expires 08/16/24.    Expected Discharge Plan: Skilled Nursing Facility Barriers to Discharge: Continued Medical Work up, SNF Pending bed offer               Expected Discharge Plan and Services In-house Referral: Clinical Social Work Discharge Planning Services: CM Consult   Living arrangements for the past 2 months: Group Home                                       Social Drivers of Health (SDOH) Interventions SDOH Screenings   Food Insecurity: No Food Insecurity (07/14/2024)  Housing: Low Risk  (07/14/2024)  Transportation Needs: No Transportation Needs (07/14/2024)  Utilities: Not At Risk (07/14/2024)  Depression (PHQ2-9): Low Risk  (07/10/2022)  Social Connections: Unknown (07/14/2024)  Tobacco Use: Low Risk  (07/02/2024)    Readmission Risk Interventions     No data to display

## 2024-07-19 NOTE — Plan of Care (Signed)
   Problem: Education: Goal: Knowledge of General Education information will improve Description Including pain rating scale, medication(s)/side effects and non-pharmacologic comfort measures Outcome: Progressing

## 2024-07-19 NOTE — Assessment & Plan Note (Signed)
 Seizure/mood disorder: Continue home Depakote  500 mg twice daily, lamotrigine  125 mg daily, quetiapine  200 mg twice daily Hypothyroidism: Hold home levothyroxine  150 mcg daily in setting of comfort care GERD: Hold home pantoprazole  40 mg in setting of comfort care Constipation: Senna 1 tablet daily, milk of magnesia 30 mL daily PRN, bisacodyl  10 mg rectally daily PRN

## 2024-07-20 ENCOUNTER — Encounter: Admitting: Occupational Therapy

## 2024-07-20 ENCOUNTER — Ambulatory Visit: Admitting: Physical Therapy

## 2024-07-20 DIAGNOSIS — R627 Adult failure to thrive: Secondary | ICD-10-CM | POA: Diagnosis not present

## 2024-07-20 DIAGNOSIS — Z515 Encounter for palliative care: Secondary | ICD-10-CM | POA: Diagnosis not present

## 2024-07-20 NOTE — Assessment & Plan Note (Signed)
-   Ensure twice daily between meals - Dysphagia 2 diet - No indication for SLP/RD given patient comfort care

## 2024-07-20 NOTE — Progress Notes (Signed)
     Daily Progress Note Intern Pager: [phone]  Patient name: [name] Medical record number: [id number] Date of birth: Jun 01, 1956 Age: 68 y.o. Gender: male  Primary Care Provider: Hilma Philis HERO Consultants: Palliative Code Status: DNR, Comfort Care  Pt Overview and Major Events to Date:  - 7/22 admitted, transition to comfort care  Medical Decision Making:  [name] is a 68 y.o. male admitted for failure to thrive, now comfort care following discussion between palliative and sister, Marval (HPOA).  Patient is medically stable for discharge, awaiting placement at SNF. Denied from [facility] as not imminently end-of-life.  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  Assessment & Plan Palliative care by specialist Goals of care, counseling/discussion Awaiting placement at SNF. - Comfort care measures - Palliative following Failure to thrive in adult - Ensure twice daily between meals - Dysphagia 2 diet - No indication for SLP/RD given patient comfort care Chronic health problem Seizure/mood disorder: Continue home Depakote  500 mg twice daily, lamotrigine  125 mg daily, quetiapine  200 mg twice daily Hypothyroidism: Hold home levothyroxine  150 mcg daily in setting of comfort care GERD: Hold home pantoprazole  40 mg in setting of comfort care Constipation: Senna 1 tablet daily, milk of magnesia 30 mL daily PRN, bisacodyl  10 mg rectally daily PRN   FEN/GI: Dysphagia 2 PPx: None Dispo: SNF pending placement; patient medically ready for discharge  Subjective:  Patient sleeping during visit; would wake up and look at me with shaking his leg, but would then go back to sleep; did not answer any questions.  Objective: Temp:  [97.7 F (36.5 C)-98.5 F (36.9 C)] 98.5 F (36.9 C) (07/27 2151) Pulse Rate:  [71] 71 (07/27 2151) Resp:  [17-18] 18  (07/27 2151) BP: (93-102)/(49-64) 93/49 (07/27 2151) SpO2:  [97 %-98 %] 97 % (07/27 2151) Physical Exam: General: Patient is sleeping, no acute distress. Respiratory: Normal work of breathing on room air. Neuro: Patient asleep; will wake up and make eye contact after shaking his knee, then falls back asleep. Not responding to questions.  Laboratory: Most recent CBC Lab Results  Component Value Date   WBC 4.7 07/13/2024   HGB 11.2 (L) 07/13/2024   HCT 33.5 (L) 07/13/2024   MCV 95.7 07/13/2024   PLT 178 07/13/2024   Most recent BMP    Latest Ref Rng & Units 07/13/2024    6:00 PM  BMP  Glucose 70 - 99 mg/dL 897   BUN 8 - 23 mg/dL 35   Creatinine 9.38 - 1.24 mg/dL 8.38   Sodium 864 - 854 mmol/L 138   Potassium 3.5 - 5.1 mmol/L 4.3   Chloride 98 - 111 mmol/L 100   CO2 22 - 32 mmol/L 19   Calcium  8.9 - 10.3 mg/dL 9.5     Larraine Palma, MD 07/20/2024, 7:14 AM  PGY-1, [facility] FPTS Intern pager: [phone], text pages welcome Secure chat group [facility] Teaching Service

## 2024-07-20 NOTE — Progress Notes (Signed)
 Daily Progress Note   Patient Name: [name]       Date: 07/20/2024 DOB: 05-10-1956  Age: 68 y.o. MRN#: [id number] Attending Physician: Rosalynn Camie CROME, MD Primary Care Physician: Hilma Philis HERO Admit Date: 07/13/2024  Reason for Consultation/Follow-up: Establishing goals of care  Subjective: Earlier today, we visited the patient at his bedside, with no family members present. He appears frail but is not in any acute distress. He is observed to be sleeping comfortably and is easily awakened, though he quickly falls back asleep. The patient is not communicative due to his underlying cognitive status. The RN reports that the patient continues to refuse meals, with the last documented intake being only 10% of his meal tray. We attempted to feed him, but didn't show any interest.  His urine is noted to be concentrated.  Length of Stay: 0  Current Medications: Scheduled Meds:   divalproex   500 mg Oral Q12H   feeding supplement  237 mL Oral BID BM   lamoTRIgine   125 mg Oral QHS   QUEtiapine   200 mg Oral BID   senna  1 tablet Oral Daily    Continuous Infusions:   PRN Meds: acetaminophen  **OR** acetaminophen , antiseptic oral rinse, artificial tears, bisacodyl , fentaNYL  (SUBLIMAZE ) injection, glycopyrrolate  **OR** glycopyrrolate  **OR** glycopyrrolate , haloperidol  **OR** haloperidol  **OR** haloperidol  lactate, magnesium  hydroxide, ondansetron   Physical Exam Vitals and nursing note reviewed.  Constitutional:      Appearance: He is ill-appearing.  Cardiovascular:     Rate and Rhythm: Normal rate.  Pulmonary:     Effort: Pulmonary effort is normal.  Genitourinary:    Comments: External male cath in place.  Musculoskeletal:     Comments: Generalized weakness   Skin:     General: Skin is warm and dry.  Neurological:     Mental Status: Mental status is at baseline. He is disoriented.  Psychiatric:     Comments: UTA              Vital Signs: BP (!) 86/58 (BP Location: Right Arm)   Pulse 66   Temp 97.8 F (36.6 C) (Oral)   Resp 16   Ht 5' 8 (1.727 m)   Wt 50.7 kg   SpO2 97%   BMI 17.00 kg/m  SpO2: SpO2: 97 % O2 Device: O2 Device: Room Air O2  Flow Rate:    Intake/output summary:  Intake/Output Summary (Last 24 hours) at 07/20/2024 1639 Last data filed at 07/20/2024 1200 Gross per 24 hour  Intake 237 ml  Output --  Net 237 ml   LBM: Last BM Date : 07/18/24 Baseline Weight: Weight: 50.7 kg Most recent weight: Weight: 50.7 kg       Palliative Assessment/Data: 30%      Patient Active Problem List   Diagnosis Date Noted   Hospice care 07/15/2024   Goals of care, counseling/discussion 07/15/2024   Palliative care by specialist 07/15/2024   Failure to thrive in adult 07/14/2024   AKI (acute kidney injury) (HCC) 07/14/2024   Adverse effect of other antipsychotics and neuroleptics, initial encounter 07/04/2024   Malnutrition of moderate degree 07/03/2024   Encephalopathy 07/02/2024   Congenital cognitive impairment 05/15/2024   History of stroke with residual deficit 05/15/2024   AMS (altered mental status)  Weakness 05/14/2024   Chronic health problem 05/14/2024   Polypharmacy 06/26/2023   Mixed hyperlipidemia 05/26/2023   Intellectual developmental delay 05/26/2023   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) 10/26/2021   Seizure disorder (HCC) 10/26/2021   History of Barrett's esophagus 11/18/2020   History of Esophageal dysmotility 11/18/2020   History of Esophageal dilation and tortuosity on EGD 11/18/2020   Dysphagia    Hiatal hernia 10/21/2020   Emesis 07/24/2015   Weight loss, unintentional 07/04/2009   ANEMIA, IRON DEFICIENCY 05/04/2008   Hypothyroidism 04/05/2008    Schizophrenia, unspecified type (HCC) 04/05/2008   Depression 04/05/2008   Essential hypertension 04/05/2008   GERD 04/05/2008   Gastroparesis 04/05/2008   Acute metabolic encephalopathy 04/05/2008   HIATAL HERNIA 04/01/2002    Palliative Care Assessment & Plan   Patient Profile: The patient is a 68 year old male with a past medical history of developmental delay secondary to a prior craniotomy, schizophrenia, hypertension, hyperlipidemia, iron deficiency anemia, and seizure disorder. He was admitted on 07/13/2024 due to poor oral intake for the past three weeks, likely in the context of adult failure to thrive.  Assessment:  Earlier today, we visited the patient at his bedside without any family members present. He appears frail but is not experiencing any acute distress. He was observed sleeping comfortably and is easily awakened, although he quickly falls back asleep. Due to his underlying cognitive status, the patient is not communicative. The RN reports that he continues to refuse meals, with the last documented intake being only 10% of his meal tray. We attempted to feed him, but he showed no interest. His urine is noted to be concentrated.  Recommendations/Plan: Code Status: DNR-Comfort. No further escalation in level of care necessary.  MOST form completed today Continue to provide holistic palliative support. Continue with current pharmacologic symptom measures: Start Fentanyl  25 mcg Q1 PRN for pain, glycopyrrolate  0.2mg  Q4 PRN for excessive secretions, haloperidol  PRN for agitation.  Provided and continue psycho-social emotional support. Consider [facility] re-evaluation to see if he now meets criteria. Continue with holistic palliative support.    Goals of Care and Additional Recommendations: Full comfort measures   Code Status:    Code Status Orders  (From admission, onward)           Start     Ordered   07/14/24 1710  Do not attempt resuscitation (DNR) -  Comfort care  Continuous       Question Answer Comment  If patient has no pulse and is not breathing Do Not  Attempt Resuscitation   In Pre-Arrest Conditions (Patient Is Breathing and Has a Pulse) Provide comfort measures. Relieve any mechanical airway obstruction. Avoid transfer unless required for comfort.   Consent: Discussion documented in EHR or advanced directives reviewed      07/14/24 1710           Code Status History     Date Active Date Inactive Code Status Order ID Comments User Context   07/14/2024 0101 07/14/2024 1710 Limited: Do not attempt resuscitation (DNR) -DNR-LIMITED -Do Not Intubate/DNI  [id number]  Manon Jester, DO ED   07/02/2024 1912 07/06/2024 2039 Full Code [id number]  Toma, Dimitry, MD ED   05/14/2024 1703 05/15/2024 1829 Full Code [id number]  Lafe Domino, DO ED   06/05/2023 0746 06/06/2023 2250 Full Code [id number]  Alto Isaiah CROME, NP ED   06/05/2023 0648 06/05/2023 0746 Full Code [id number]  Alto Isaiah CROME, NP ED   05/25/2023 1935 05/26/2023 2230 Full Code [id number]  Laurita Cort DASEN, MD ED   04/09/2023 1354 04/15/2023 2052 Full Code [id number]  Caleen Burgess Campbell, MD ED   11/15/2020 1524 12/07/2020 2130 Full Code [id number]  Pegge Toribio PARAS, PA-C Inpatient   11/15/2020 1524 11/15/2020 1524 Full Code [id number]  Pegge Toribio PARAS, PA-C Inpatient   10/21/2020 0234 11/15/2020 1505 Full Code [id number]  Alfornia Madison, MD ED   09/05/2020 1522 09/06/2020 2251 Full Code [id number]  Henriette Mora, MD ED   07/24/2015 0158 07/27/2015 1951 Full Code [id number]  Celinda Alm Lot, MD Inpatient   01/08/2015 2102 01/11/2015 1750 Full Code [id number]  Hilma Rankins, MD Inpatient      Advance Directive Documentation    Flowsheet Row Most Recent Value  Type of Advance Directive Healthcare Power of Attorney  Pre-existing out of facility DNR order (yellow form or pink MOST form) --  MOST Form in Place? --    Prognosis:  Remains poor given the overall decline with ADLs and  reduced intake.   Discharge Planning: Plan for SNF with outpatient Hospice service  Care plan was discussed with sister/POA Marval Mulberry and Treatment team.   Thank you for allowing the Palliative Medicine Team to assist in the care of this patient.   Time Spent:   I spent  35 minutes in the care of the patient today in the above activities and documenting the encounter.     Kathlyne JULIANNA Tracie Mickey, NP  Please contact Palliative Medicine Team phone at [phone] for questions and concerns.

## 2024-07-20 NOTE — Assessment & Plan Note (Signed)
 Seizure/mood disorder: Continue home Depakote  500 mg twice daily, lamotrigine  125 mg daily, quetiapine  200 mg twice daily Hypothyroidism: Hold home levothyroxine  150 mcg daily in setting of comfort care GERD: Hold home pantoprazole  40 mg in setting of comfort care Constipation: Senna 1 tablet daily, milk of magnesia 30 mL daily PRN, bisacodyl  10 mg rectally daily PRN

## 2024-07-20 NOTE — TOC Progression Note (Addendum)
 Transition of Care [facility]) - Progression Note    Patient Details  Name: [name] MRN: [id number] Date of Birth: 09-30-1956  [facility] CM/SW Contact  Bridget Cordella Simmonds, LCSW Phone Number: 07/20/2024, 12:26 PM  Clinical Narrative:   CSW reached out to [facility], [facility], [facility] regarding referral.  1220: None of the above facilities are able to offer a bed.   1500: CSW spoke with pt sister Tressie in room, she will call [name]. 1515: CSW spoke with Marval, updated her that no offers from the above facilities.  [name] asking for specific reasons facilities have declined pt, asking about pursuing if these declines are discriminatory based on mental health issues.  We discussed at length the available options: Marval still works, unable to provide care full time at home.  Marval also reports that the group home is not able to provide the level of care pt needs at this point.  Discussed that pt is stable at this point we do need to move forward with a plan.  If no other options, will need to accept offer at one of the SNFs that has made a bed offer.  [name] voiced understanding of this.  [facility] still listed as considering pt.  [name] requests that CSW find out if they are option, and she will make determination of which bed offer to accept if [facility] is unable to offer bed.  CSW spoke with Tanya/[facility]: she will come to the hospital to see the pt and determine if they can offer.    1530: TC [name].  More conversation.  [name] reports she was not informed by previous CSW that she will need to make a decision on any sort of timeline, feels stressed and pressured.  CSW acknowledged this, listened, affirmed this is difficult.        Expected Discharge Plan: Skilled Nursing Facility Barriers to Discharge: Continued Medical Work up, SNF Pending bed offer               Expected Discharge Plan and Services In-house Referral: Clinical Social  Work Discharge Planning Services: CM Consult   Living arrangements for the past 2 months: Group Home                                       Social Drivers of Health (SDOH) Interventions SDOH Screenings   Food Insecurity: No Food Insecurity (07/14/2024)  Housing: Low Risk  (07/14/2024)  Transportation Needs: No Transportation Needs (07/14/2024)  Utilities: Not At Risk (07/14/2024)  Depression (PHQ2-9): Low Risk  (07/10/2022)  Social Connections: Unknown (07/14/2024)  Tobacco Use: Low Risk  (07/02/2024)    Readmission Risk Interventions     No data to display

## 2024-07-20 NOTE — Plan of Care (Signed)
  Problem: Activity: Goal: Risk for activity intolerance will decrease Outcome: Progressing   Problem: Elimination: Goal: Will not experience complications related to bowel motility Outcome: Progressing   Problem: Pain Managment: Goal: General experience of comfort will improve and/or be controlled Outcome: Progressing

## 2024-07-20 NOTE — Assessment & Plan Note (Addendum)
 Awaiting placement at SNF. - Comfort care measures - Palliative following

## 2024-07-21 DIAGNOSIS — R627 Adult failure to thrive: Secondary | ICD-10-CM | POA: Diagnosis not present

## 2024-07-21 DIAGNOSIS — Z515 Encounter for palliative care: Secondary | ICD-10-CM | POA: Diagnosis not present

## 2024-07-21 NOTE — Assessment & Plan Note (Signed)
-   Ensure twice daily between meals - Dysphagia 2 diet - No indication for SLP/RD given patient comfort care

## 2024-07-21 NOTE — Progress Notes (Signed)
 [facility] Liaison Note   Request from [facility] to reevaluate patient for [facility]. At this time patient does not meet criteria for hospice inpatient unit.    Liaisons are happy to assess for hospice services in the home or LTC facility and are also able to reassess for inpatient unit appropriateness at a later time if requested.    Thank you for the opportunity to participate in this patient's care.     Eleanor [facility] Liaison [phone]

## 2024-07-21 NOTE — Assessment & Plan Note (Signed)
 Seizure/mood disorder: Continue home Depakote  500 mg twice daily, lamotrigine  125 mg daily, quetiapine  200 mg twice daily Hypothyroidism: Hold home levothyroxine  150 mcg daily in setting of comfort care GERD: Hold home pantoprazole  40 mg in setting of comfort care Constipation: Senna 1 tablet daily, milk of magnesia 30 mL daily PRN, bisacodyl  10 mg rectally daily PRN

## 2024-07-21 NOTE — Progress Notes (Signed)
 PT Cancellation Note  Patient Details Name: [name] MRN: [id number] DOB: 1956/02/01   Cancelled Treatment:    Reason Eval/Treat Not Completed: Patient declined, no reason specified. Pt reports that he is tired. Offered him the food on his breakfast tray, he refused this as well.   Richerd Lipoma, PT  Acute Rehab Services Secure chat preferred Office [phone]    [name] Lipoma 07/21/2024, 9:49 AM

## 2024-07-21 NOTE — TOC Progression Note (Signed)
 Transition of Care [facility]) - Progression Note    Patient Details  Name: [name] MRN: [id number] Date of Birth: Sep 07, 1956  [facility]) CM/SW Contact  Bridget Cordella Simmonds, LCSW Phone Number: 07/21/2024, 10:53 AM  Clinical Narrative:   CSW received message from Tanya/[facility]: they are not able to offer bed.  1040: CSW spoke with pt siter/guardian [name].  Updated her that [facility] cannot offer bed.  She asked additional questions as to what documentation is sent out with SNF referrals, as she was told by [facility] that the notes from the hospital referenced behavioral issues, which led them to decline pt.  We discussed this.  CSW asked about the other bed offers.  Marval is at a conference in [place] , states she will have to review these offers when she returns to her hotel later today.  She asked that CSW message her with all the current bed offers, which was done.    1300: MD requesting reeval for residential hospice.  CSW reached out to  Shawn/[facility] and he will follow up.  Expected Discharge Plan: Skilled Nursing Facility Barriers to Discharge: Continued Medical Work up, SNF Pending bed offer               Expected Discharge Plan and Services In-house Referral: Clinical Social Work Discharge Planning Services: CM Consult   Living arrangements for the past 2 months: Group Home                                       Social Drivers of Health (SDOH) Interventions SDOH Screenings   Food Insecurity: No Food Insecurity (07/14/2024)  Housing: Low Risk  (07/14/2024)  Transportation Needs: No Transportation Needs (07/14/2024)  Utilities: Not At Risk (07/14/2024)  Depression (PHQ2-9): Low Risk  (07/10/2022)  Social Connections: Unknown (07/14/2024)  Tobacco Use: Low Risk  (07/02/2024)    Readmission Risk Interventions     No data to display

## 2024-07-21 NOTE — Progress Notes (Signed)
     Daily Progress Note Intern Pager: [phone]  Patient name: [name] Medical record number: [id number] Date of birth: Jul 14, 1956 Age: 68 y.o. Gender: male  Primary Care Provider: Hilma Philis HERO Consultants: Palliative Code Status: DNR, Comfort Care  Pt Overview and Major Events to Date:  - 7/22 admitted, transitioned to comfort care  Medical Decision Making:  [name] is a 68 y.o. male admitted for failure to thrive, now comfort care and awaiting placement. Medically stable for discharge.  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  Assessment & Plan Palliative care by specialist Goals of care, counseling/discussion Deteriorating over past few days with not eating or drinking, BP decreasing steadily; consider whether patient now meeting [facility] criteria. - Social work helping with placement - Comfort care measures - Palliative following Failure to thrive in adult - Ensure twice daily between meals - Dysphagia 2 diet - No indication for SLP/RD given patient comfort care Chronic health problem Seizure/mood disorder: Continue home Depakote  500 mg twice daily, lamotrigine  125 mg daily, quetiapine  200 mg twice daily Hypothyroidism: Hold home levothyroxine  150 mcg daily in setting of comfort care GERD: Hold home pantoprazole  40 mg in setting of comfort care Constipation: Senna 1 tablet daily, milk of magnesia 30 mL daily PRN, bisacodyl  10 mg rectally daily PRN   FEN/GI: Dys 2 PPx: None Dispo: SNF pending placement; patient is medically ready for discharge  Subjective:  Patient intermittently unintelligible, but is able to report he is having some eye pain this morning.  No other concerns.  Drink a little chocolate Ensure with me.  Objective: Temp:  [97.8 F (36.6 C)-98.3 F (36.8 C)] 98.3 F (36.8 C) (07/28 2007) Pulse Rate:  [66]  66 (07/28 1201) Resp:  [16-17] 17 (07/28 2007) BP: (86-99)/(58-59) 99/59 (07/28 2007) SpO2:  [97 %-100 %] 100 % (07/28 2007) Physical Exam: General: Patient lying down in bed, appears chronically ill and thin, no acute distress. Cardiovascular: Regular rate and rhythm, no murmurs/rubs/gallops. Respiratory: Normal work of breathing on room air. Clear to auscultation bilaterally; no wheezes, crackles. Abdomen: Bowel sounds present and normoactive bilaterally. Soft, nondistended, nontender. Extremities: Skin warm, dry. No bilateral lower extremity edema. Neuro: Alert and overall responding to questions appropriately, though intermittently unintelligible.  Laboratory: Most recent CBC Lab Results  Component Value Date   WBC 4.7 07/13/2024   HGB 11.2 (L) 07/13/2024   HCT 33.5 (L) 07/13/2024   MCV 95.7 07/13/2024   PLT 178 07/13/2024   Most recent BMP    Latest Ref Rng & Units 07/13/2024    6:00 PM  BMP  Glucose 70 - 99 mg/dL 897   BUN 8 - 23 mg/dL 35   Creatinine 9.38 - 1.24 mg/dL 8.38   Sodium 864 - 854 mmol/L 138   Potassium 3.5 - 5.1 mmol/L 4.3   Chloride 98 - 111 mmol/L 100   CO2 22 - 32 mmol/L 19   Calcium  8.9 - 10.3 mg/dL 9.5     Larraine Palma, MD 07/21/2024, 7:21 AM  PGY-1, [place] [facility] FPTS Intern pager: [phone], text pages welcome Secure chat group [facility] Teaching Service

## 2024-07-21 NOTE — Plan of Care (Signed)
   Problem: Education: Goal: Knowledge of General Education information will improve Description: Including pain rating scale, medication(s)/side effects and non-pharmacologic comfort measures Outcome: Progressing   Problem: Nutrition: Goal: Adequate nutrition will be maintained Outcome: Progressing   Problem: Coping: Goal: Level of anxiety will decrease Outcome: Progressing

## 2024-07-21 NOTE — Assessment & Plan Note (Addendum)
 Deteriorating over past few days with not eating or drinking, BP decreasing steadily; consider whether patient now meeting [facility] criteria. - Social work helping with placement - Comfort care measures - Palliative following

## 2024-07-22 DIAGNOSIS — R627 Adult failure to thrive: Secondary | ICD-10-CM | POA: Diagnosis not present

## 2024-07-22 DIAGNOSIS — Z515 Encounter for palliative care: Secondary | ICD-10-CM | POA: Diagnosis not present

## 2024-07-22 MED ORDER — HALOPERIDOL LACTATE 2 MG/ML PO CONC: 0.5000 mg | ORAL | Status: DC | PRN

## 2024-07-22 MED ORDER — SENNA 8.6 MG PO TABS: 1.0000 | ORAL_TABLET | Freq: Every day | ORAL | Status: AC

## 2024-07-22 MED ORDER — GLYCOPYRROLATE 1 MG PO TABS: 1.0000 mg | ORAL_TABLET | ORAL | Status: AC | PRN

## 2024-07-22 MED ORDER — POLYVINYL ALCOHOL 1.4 % OP SOLN: 1.0000 [drp] | Freq: Four times a day (QID) | OPHTHALMIC | Status: DC | PRN

## 2024-07-22 MED ORDER — BIOTENE DRY MOUTH MT LIQD: 15.0000 mL | OROMUCOSAL | Status: DC | PRN

## 2024-07-22 NOTE — Plan of Care (Signed)
   Problem: Education: Goal: Knowledge of General Education information will improve Description: Including pain rating scale, medication(s)/side effects and non-pharmacologic comfort measures Outcome: Progressing   Problem: Activity: Goal: Risk for activity intolerance will decrease Outcome: Progressing   Problem: Nutrition: Goal: Adequate nutrition will be maintained Outcome: Progressing

## 2024-07-22 NOTE — TOC Progression Note (Addendum)
 Transition of Care [facility]) - Progression Note    Patient Details  Name: [name] MRN: [id number] Date of Birth: 07-Aug-1956  Transition [facility] Care [facility]) CM/SW Contact  Bridget Cordella Simmonds, LCSW Phone Number: 07/22/2024, 10:00 AM  Clinical Narrative:    Message from Melissa/[facility]: she spoke with Marval last night and she asked about potential DC home with home hospice.   Message sent to pt sister/guardian [name] requesting call back.   1015: Message from [name]: she will not be available by phone at all today until 530pm.  She can text only.   Return message sent asking if she can call during lunch or during a break.  Also asking about the home with hospice option.  Or if not that option, what SNF she would like to accept offer from.  1245: CSW spoke with Freda/other sister in room.  She has been in contact with Marval, who informed her that home with hospice will not be an option.  She will attempt to get [name] on the phone.  Text message from [name]: would be glad to speak with the person who has demanded a decision be made today.  Will not be able to transition to my home due to the turnaround being requested.  [name] also asking to speak with a Merchandiser, retail.     TOC supervisor Josefa informed.   1330: TC [name]: She reports that she did consider taking pt home but the person who could potentially care for him while Marval is at work is out of town until the middle of next week.  She has decided to accept offer at [facility].  She asked CSW to confirm that [facility] was near [facility] and CSW confirmed this. [name] also asking for an update from the MD on pt condition.  CSW reached out to MD team.  CSW reached out to [facility] to confirm they can receive pt.    1420: Kia confirms, [facility] can receive today.  MD notified.  [name] notified by text, Tressie notified in the room.  [name] requested that CSW call [place], QP for [facility] group home, where pt previously  lived. [phone].  CSW called, LM.    Expected Discharge Plan: Skilled Nursing Facility Barriers to Discharge: Continued Medical Work up, SNF Pending bed offer               Expected Discharge Plan and Services In-house Referral: Clinical Social Work Discharge Planning Services: CM Consult   Living arrangements for the past 2 months: Group Home                                       Social Drivers of Health (SDOH) Interventions SDOH Screenings   Food Insecurity: No Food Insecurity (07/14/2024)  Housing: Low Risk  (07/14/2024)  Transportation Needs: No Transportation Needs (07/14/2024)  Utilities: Not At Risk (07/14/2024)  Depression (PHQ2-9): Low Risk  (07/10/2022)  Social Connections: Unknown (07/14/2024)  Tobacco Use: Low Risk  (07/02/2024)    Readmission Risk Interventions     No data to display

## 2024-07-22 NOTE — Progress Notes (Signed)
 Discharge summary packet/pertinent documents provided to PTAR, Pt d/c to [facility] as ordered, Pt remains alert to self only in no apparent distress, Pt's sister at bedside.  No complaints.

## 2024-07-22 NOTE — Progress Notes (Signed)
 Physical Therapy Treatment Patient Details Name: [name] MRN: [id number] DOB: Apr 23, 1956 Today's Date: 07/22/2024   History of Present Illness [name] is a 68 y.o. male admitted 07/13/24 with failure to thrive. 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    PT Comments  Pt greeted supine in bed, pleasant and agreeable to PT session. He was able to advance OOB mobility completing transfers and in-room ambulation. Introduced RW but pt was unable to hold onto AD with LLE. He required minA for sit<>stand and modA for gait with HHA. He was unsteady and required multi-modal cues for improved sequencing and safety awareness. Pt would benefit from 2 HHA for improved stability. Recommend post-acute rehab <3 hours/day of therapy.     If plan is discharge home, recommend the following: A lot of help with walking and/or transfers;A lot of help with bathing/dressing/bathroom;Assistance with cooking/housework;Assistance with feeding;Direct supervision/assist for medications management;Help with stairs or ramp for entrance;Assist for transportation;Direct supervision/assist for financial management;Supervision due to cognitive status   Can travel by private vehicle     No  Equipment Recommendations  None recommended by PT    Recommendations for Other Services       Precautions / Restrictions Precautions Precautions: Fall Recall of Precautions/Restrictions: Impaired Restrictions Weight Bearing Restrictions Per Provider Order: No     Mobility  Bed Mobility Overal bed mobility: Needs Assistance Bed Mobility: Supine to Sit     Supine to sit: HOB elevated, Used rails, Min assist     General bed mobility comments: Pt sat up on R side of bed. He brought BLE off EOB. Assist to elevate trunk and  scoot hips fwd.    Transfers Overall transfer level: Needs assistance Equipment used: 1 person hand held assist Transfers: Sit to/from Stand Sit to Stand: Min assist           General transfer comment: Pt stood from lowest bed height. He powered up with minA. Increased time to stabilize. Good eccentric control.    Ambulation/Gait Ambulation/Gait assistance: Mod assist Gait Distance (Feet): 10 Feet Assistive device: 1 person hand held assist Gait Pattern/deviations: Step-to pattern, Decreased step length - right, Decreased step length - left, Decreased stride length, Decreased dorsiflexion - right, Decreased dorsiflexion - left, Decreased weight shift to left, Antalgic Gait velocity: decreased Gait velocity interpretation: <1.31 ft/sec, indicative of household ambulator   General Gait Details: Pt ambulated with short slow steps. He demonstrated postural sway, unsteadiness, and a heavy Rt lateral lean. Pt had difficulty advancing LLE and required modA for safety/stability while navigating around bed to recliner chair.   Stairs             Wheelchair Mobility     Tilt Bed    Modified Rankin (Stroke Patients Only)       Balance Overall balance assessment: Needs assistance Sitting-balance support: Feet supported, Bilateral upper extremity supported Sitting balance-Leahy Scale: Fair Sitting balance - Comments: Pt sat EOB with supervision. Postural control: Right lateral lean Standing balance support: Single extremity supported, During functional activity Standing balance-Leahy Scale: Poor Standing balance comment: Pt dependent on external support and was unsteady requiring min-modA.                            Communication Communication Communication: Impaired Factors Affecting Communication: Reduced clarity of speech;Difficulty expressing self  Cognition Arousal: Alert Behavior During Therapy: Flat affect   PT - Cognitive impairments: History of  cognitive impairments, No family/caregiver present to determine baseline                         Following commands: Intact      Cueing Cueing Techniques: Verbal cues, Visual cues  Exercises      General Comments        Pertinent Vitals/Pain Pain Assessment Pain Assessment: PAINAD Breathing: normal Negative Vocalization: none Facial Expression: smiling or inexpressive Body Language: relaxed Consolability: no need to console PAINAD Score: 0 Pain Intervention(s): Monitored during session    Home Living                          Prior Function            PT Goals (current goals can now be found in the care plan section) Acute Rehab PT Goals Patient Stated Goal: Get out of bed. Progress towards PT goals: Progressing toward goals    Frequency    Min 2X/week      PT Plan      Co-evaluation              AM-PAC PT 6 Clicks Mobility   Outcome Measure  Help needed turning from your back to your side while in a flat bed without using bedrails?: A Little Help needed moving from lying on your back to sitting on the side of a flat bed without using bedrails?: A Little Help needed moving to and from a bed to a chair (including a wheelchair)?: A Lot Help needed standing up from a chair using your arms (e.g., wheelchair or bedside chair)?: A Little Help needed to walk in hospital room?: A Lot Help needed climbing 3-5 steps with a railing? : Total 6 Click Score: 14    End of Session Equipment Utilized During Treatment: Gait belt Activity Tolerance: Patient tolerated treatment well;Patient limited by fatigue Patient left: in chair;with call bell/phone within reach;with chair alarm set Nurse Communication: Mobility status PT Visit Diagnosis: Adult, failure to thrive (R62.7);Muscle weakness (generalized) (M62.81);Difficulty in walking, not elsewhere classified (R26.2);Unsteadiness on feet (R26.81);Other abnormalities of gait and mobility  (R26.89);Hemiplegia and hemiparesis Hemiplegia - Right/Left: Left Hemiplegia - dominant/non-dominant: Non-dominant Hemiplegia - caused by: Cerebral infarction     Time: 1004-1016 PT Time Calculation (min) (ACUTE ONLY): 12 min  Charges:    $Therapeutic Activity: 8-22 mins PT General Charges $$ ACUTE PT VISIT: 1 Visit                     Randall SAUNDERS, PT, DPT Acute Rehabilitation Services Office: [phone] Secure Chat Preferred  [name] 07/22/2024, 10:29 AM

## 2024-07-22 NOTE — Progress Notes (Signed)
     Daily Progress Note Intern Pager: [phone]  Patient name: [name] Medical record number: [id number] Date of birth: 15-May-1956 Age: 68 y.o. Gender: male  Primary Care Provider: Hilma Philis HERO Consultants: Palliative Code Status: DNR, Comfort Care  Pt Overview and Major Events to Date:  - 7/22 admitted, transitioned to comfort care  Medical Decision Making:  [name] is a 68 y.o. male admitted for failure to thrive, now comfort care and awaiting placement. Medically stable for discharge.   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  Assessment & Plan Palliative care by specialist Goals of care, counseling/discussion Remains comfort care, awaiting placement. - Social work helping with placement - Comfort care measures - Palliative following Failure to thrive in adult - Ensure twice daily between meals - Dysphagia 2 diet - No indication for SLP/RD given patient comfort care Chronic health problem Seizure/mood disorder: Continue home Depakote  500 mg twice daily, lamotrigine  125 mg daily, quetiapine  200 mg twice daily Hypothyroidism: Hold home levothyroxine  150 mcg daily in setting of comfort care GERD: Hold home pantoprazole  40 mg in setting of comfort care Constipation: Senna 1 tablet daily, milk of magnesia 30 mL daily PRN, bisacodyl  10 mg rectally daily PRN  FEN/GI: Dys 2 PPx: None Dispo: SNF pending placement, patient medically ready for discharge.  Subjective:  Patient is doing well today other than some continued eye pain though he has previously denied wanting eyedrops.  He is more interactive and talkative this morning and is asking questions.  Still intermittently unintelligible.  Objective: Temp:  [97.4 F (36.3 C)-98.2 F (36.8 C)] 97.8 F (36.6 C) (07/30 0450) Pulse Rate:  [63-68] 68 (07/30 0450) Resp:  [16-17] 16 (07/30  0450) BP: (96-111)/(60-64) 111/64 (07/30 0450) SpO2:  [97 %-98 %] 98 % (07/30 0450) Physical Exam: General: Patient lying back in bed, appears chronically ill, thin, no acute distress. Cardiovascular: Regular rate and rhythm, no murmurs/rubs/gallops. Respiratory: Normal work of breathing on room air. Clear to auscultation bilaterally; no wheezes, crackles. Abdomen: Bowel sounds present and normoactive bilaterally. Soft, nondistended, nontender. Extremities: Skin warm, dry. No bilateral lower extremity edema. Neuro: Alert and appropriately responding to questions. More interactive than yesterday. Intermittently unintelligible (known dysarthria).  Laboratory: Most recent CBC Lab Results  Component Value Date   WBC 4.7 07/13/2024   HGB 11.2 (L) 07/13/2024   HCT 33.5 (L) 07/13/2024   MCV 95.7 07/13/2024   PLT 178 07/13/2024   Most recent BMP    Latest Ref Rng & Units 07/13/2024    6:00 PM  BMP  Glucose 70 - 99 mg/dL 897   BUN 8 - 23 mg/dL 35   Creatinine 9.38 - 1.24 mg/dL 8.38   Sodium 864 - 854 mmol/L 138   Potassium 3.5 - 5.1 mmol/L 4.3   Chloride 98 - 111 mmol/L 100   CO2 22 - 32 mmol/L 19   Calcium  8.9 - 10.3 mg/dL 9.5     Larraine Palma, MD 07/22/2024, 7:17 AM  PGY-1, [facility] Family Medicine FPTS Intern pager: [phone], text pages welcome Secure chat group [facility] Teaching Service

## 2024-07-22 NOTE — Discharge Summary (Addendum)
 Family Medicine Teaching [facility] Discharge Summary  Patient name: [name] Medical record number: [id number] Date of birth: 11/08/1956 Age: 68 y.o. Gender: male Date of Admission: 07/13/2024  Date of Discharge: 07/22/2024 Admitting Physician: Leafy Scriver, DO  Primary Care Provider: Hilma Philis HERO Consultants: Palliative Care, Neurology (signed off)  Indication for Hospitalization: Progressively worsening performance of ADLs  Discharge Diagnoses/Problem List:  Principal Problem for Admission: Failure to thrive Other Problems addressed during stay:  Principal Problem:   Failure to thrive in adult Active Problems:   AKI (acute kidney injury) [facility])   Hospice care; Palliative care by specialist  Brief Hospital Course:  JAVONTAY VANDAM is a 68 y.o. year old with a history of schizophrenia, seizure disorder, IDA, HTN, HLD, Barrett esophagus, dysphagia who presented with poor PO intake and was admitted to the [facility] Medicine Teaching Service for failure to thrive and ultimately made comfort care based on palliative discussion.  Failure to Thrive Patient presented from ALF with progressive inability to perform ADLs and worsening PO intake.  Progressive decline over months with inadequate PO intake to sustain life.  Patient's sister Marval, his HPOA, interested in palliative care.  Palliative met with sister on 7/22, ultimately with decision to go comfort care.  Medications deescalated following.  Unable to transition to inpatient hospice, discharged to SNF for rehab.  Patient's PO intake remained poor at time of discharge.  AKI Cr increased to 1.61 on admission (baseline 0.9-1.1); suspect prerenal in setting of decreased PO intake. Patient got 1L bolus LR in ED; no additional IV fluids given based on GOC conversation resulting in comfort care.  Other chronic conditions were medically managed with home medications and formulary alternatives as necessary  (seizure/mood disorder, hypothyroidism, GERD, constipation).  Disposition: SNF  Discharge Condition: Baseline, stable  Discharge Exam:  Vitals:   07/22/24 0450 07/22/24 0917  BP: 111/64 91/61  Pulse: 68 92  Resp: 16 16  Temp: 97.8 F (36.6 C) (!) 97.4 F (36.3 C)  SpO2: 98% (!) 72%   Physical Exam: (per Dr. Larraine 7/30 AM) General: Patient lying back in bed, appears chronically ill, thin, no acute distress. Cardiovascular: Regular rate and rhythm, no murmurs/rubs/gallops. Respiratory: Normal work of breathing on room air. Clear to auscultation bilaterally; no wheezes, crackles. Abdomen: Bowel sounds present and normoactive bilaterally. Soft, nondistended, nontender. Extremities: Skin warm, dry. No bilateral lower extremity edema. Neuro: Alert and appropriately responding to questions. More interactive than yesterday. Intermittently unintelligible (known dysarthria).  Significant Procedures: None  Significant Labs and Imaging:  No results for input(s): WBC, HGB, HCT, PLT in the last 48 hours. No results for input(s): NA, K, CL, CO2, GLUCOSE, BUN, CREATININE, CALCIUM , MG, PHOS, ALKPHOS, AST, ALT, ALBUMIN , PROTEIN in the last 48 hours.  Summary of brief radiologist imaging interpretations: - CT heas stroke 7/10: ASPECTS 10, stable chronic encephalomalacia within R frontal lobe. - MRI brain WO 7/10: No acute or recent infarction, limited study.  extensive right frontal encephalomalacia and surrounding gliosis. - CXR 7/10: Low long volumes.  Degenerative glenohumeral arthropathy bilaterally.  Results/Tests Pending at Time of Discharge: Unresulted Labs (From admission, onward)    None       Discharge Medications:  Allergies as of 07/22/2024       Reactions   Firvanq [vancomycin] Hives, Itching           Medication List     STOP taking these medications    aspirin  EC 81 MG tablet   benazepril 5 MG  tablet Commonly known as:  LOTENSIN   diphenhydrAMINE  25 MG tablet Commonly known as: BENADRYL    guaiFENesin  100 MG/5ML liquid Commonly known as: ROBITUSSIN   levothyroxine  150 MCG tablet Commonly known as: SYNTHROID    metoprolol  tartrate 25 MG tablet Commonly known as: LOPRESSOR    NAC 600 MG Caps Generic drug: Acetylcysteine   pantoprazole  40 MG tablet Commonly known as: PROTONIX    promethazine 25 MG tablet Commonly known as: PHENERGAN   rosuvastatin  20 MG tablet Commonly known as: CRESTOR    sodium phosphate Enem   Vitamin D3 50 MCG (2000 UT) capsule       TAKE these medications    acetaminophen  325 MG tablet Commonly known as: TYLENOL  Take 650 mg by mouth every 4 (four) hours as needed for mild pain (pain score 1-3) or fever.   ANTACID/ANTIGAS PO Take 15-30 mLs by mouth See admin instructions. Give 15 mL by mouth every 2 hours as needed for less than 100 lbs. Give 30 mL every 2 hours as needed for > 100 lbs.   antiseptic oral rinse Liqd Apply 15 mLs topically as needed for dry mouth.   artificial tears ophthalmic solution Place 1 drop into both eyes 4 (four) times daily as needed for dry eyes.   bisacodyl  10 MG suppository Commonly known as: DULCOLAX Place 10 mg rectally daily as needed (no results from milk of magnesia.).   [place] Lotn Apply 1 application  topically 2 (two) times daily. Apply to both feet   divalproex  500 MG DR tablet Commonly known as: DEPAKOTE  Take 1 tablet (500 mg total) by mouth every 12 (twelve) hours.   docusate sodium  100 MG capsule Commonly known as: COLACE Take 1 capsule (100 mg total) by mouth 2 (two) times daily.   Ensure Original Liqd Take 1 Can by mouth 3 (three) times daily with meals as needed (< 50% meal consumed).   glycopyrrolate  1 MG tablet Commonly known as: ROBINUL  Take 1 tablet (1 mg total) by mouth every 4 (four) hours as needed (excessive secretions).   haloperidol  2 MG/ML solution Commonly known as: HALDOL  Place 0.3 mLs  (0.6 mg total) under the tongue every 4 (four) hours as needed for agitation (or delirium).   hydrocortisone  cream 1 % Apply 1 Application topically daily as needed for itching (bug bites).   lamoTRIgine  25 MG tablet Commonly known as: LaMICtal  Take 5 tablets (125 mg total) by mouth at bedtime.   loperamide 2 MG tablet Commonly known as: IMODIUM A-D Take 2-4 mg by mouth See admin instructions. Give 2 tablets (4mg ) by mouth for initial dose of diarrhea, then give 1 tablet (2mg ) for each loose stool afterward. Do not exceed 16mg  in 24 hours.   magnesium  hydroxide 400 MG/5ML suspension Commonly known as: MILK OF MAGNESIA Take 15-30 mLs by mouth See admin instructions. Give 30mL daily as needed for > 100 lbs. Give 15mL daily as needed for < 100 lbs. Take if no bowel movement in 3 days.   ondansetron  4 MG tablet Commonly known as: ZOFRAN  Take 4 mg by mouth 4 (four) times daily as needed for vomiting.   polyethylene glycol 17 g packet Commonly known as: MIRALAX  / GLYCOLAX  Take 17 g by mouth 2 (two) times daily.   QUEtiapine  200 MG tablet Commonly known as: SEROQUEL  Take 1 tablet (200 mg total) by mouth 2 (two) times daily.   senna 8.6 MG Tabs tablet Commonly known as: SENOKOT Take 1 tablet (8.6 mg total) by mouth daily.   Triple  Antibiotic Oint Apply 1 Application topically 3 times/day as needed-between meals & bedtime (cuts, abrasions).   vitamin A & D ointment Apply 1 Application topically 3 (three) times daily as needed (skin irritation).       Discharge Instructions: Please refer to Patient Instructions section of EMR for full details.  Patient was counseled important signs and symptoms that should prompt return to medical care, changes in medications, dietary instructions, activity restrictions, and follow up appointments.   Follow-Up Appointments:  Contact information for follow-up providers     Schedule an appointment as soon as possible for a visit  with Connect with  your PCP/Specialist as discussed.   Contact information: [email or web address] Call our physician referral line at [phone].             Contact information for after-discharge care     Destination     [facility] .   Service: Skilled Nursing Contact information: [address] [place]  [address] [phone]                    Toma Matas, MD 07/22/2024, 2:29 PM PGY-2, [facility] Family Medicine

## 2024-07-22 NOTE — Progress Notes (Signed)
 Report given to Annabella Louder, staff nurse at [facility], All questions and concerns were fully answered.

## 2024-07-22 NOTE — Assessment & Plan Note (Signed)
 Seizure/mood disorder: Continue home Depakote  500 mg twice daily, lamotrigine  125 mg daily, quetiapine  200 mg twice daily Hypothyroidism: Hold home levothyroxine  150 mcg daily in setting of comfort care GERD: Hold home pantoprazole  40 mg in setting of comfort care Constipation: Senna 1 tablet daily, milk of magnesia 30 mL daily PRN, bisacodyl  10 mg rectally daily PRN

## 2024-07-22 NOTE — Assessment & Plan Note (Signed)
-   Ensure twice daily between meals - Dysphagia 2 diet - No indication for SLP/RD given patient comfort care

## 2024-07-22 NOTE — Assessment & Plan Note (Signed)
 Remains comfort care, awaiting placement. - Social work helping with placement - Comfort care measures - Palliative following

## 2024-07-22 NOTE — TOC Transition Note (Signed)
 Transition of Care [facility]) - Discharge Note   Patient Details  Name: [name] MRN: [id number] Date of Birth: 1956-01-30  Transition [facility]) CM/SW Contact:  Lendia Dais, LCSW Phone Number: 07/22/2024, 2:44 PM   Clinical Narrative: CSW contacted PTAR to have pt transported to [facility] at 1440.   RN report to [phone].      Final next level of care: Skilled Nursing Facility Barriers to Discharge: Barriers Resolved   Patient Goals and CMS Choice Patient states their goals for this hospitalization and ongoing recovery are:: Unable to assess          Discharge Placement              Patient chooses bed at: [facility] Patient to be transferred to facility by: PTAR Name of family member notified: Benton (POA),[name] (Sister)/ Karlene (sister). Patient and family notified of of transfer: 07/22/24  Discharge Plan and Services Additional resources added to the After Visit Summary for   In-house Referral: Clinical Social Work Discharge Planning Services: CM Consult                                 Social Drivers of Health (SDOH) Interventions SDOH Screenings   Food Insecurity: No Food Insecurity (07/14/2024)  Housing: Low Risk  (07/14/2024)  Transportation Needs: No Transportation Needs (07/14/2024)  Utilities: Not At Risk (07/14/2024)  Depression (PHQ2-9): Low Risk  (07/10/2022)  Social Connections: Unknown (07/14/2024)  Tobacco Use: Low Risk  (07/02/2024)     Readmission Risk Interventions     No data to display

## 2024-07-23 ENCOUNTER — Ambulatory Visit: Admitting: Neurology

## 2024-10-05 ENCOUNTER — Encounter: Payer: Self-pay | Admitting: Neurology

## 2024-10-06 ENCOUNTER — Encounter: Payer: Self-pay | Admitting: Neurology

## 2024-10-06 ENCOUNTER — Telehealth: Payer: Self-pay | Admitting: Neurology

## 2024-10-06 ENCOUNTER — Telehealth: Payer: Self-pay

## 2024-10-06 ENCOUNTER — Ambulatory Visit (INDEPENDENT_AMBULATORY_CARE_PROVIDER_SITE_OTHER): Payer: Medicare Other | Admitting: Neurology

## 2024-10-06 VITALS — BP 115/74 | HR 64 | Resp 16

## 2024-10-06 DIAGNOSIS — G40909 Epilepsy, unspecified, not intractable, without status epilepticus: Secondary | ICD-10-CM

## 2024-10-06 DIAGNOSIS — F209 Schizophrenia, unspecified: Secondary | ICD-10-CM

## 2024-10-06 DIAGNOSIS — R569 Unspecified convulsions: Secondary | ICD-10-CM

## 2024-10-06 NOTE — Telephone Encounter (Signed)
 Call to patient sister, she denies any seizure activity, now on palliative care. She will communicate with [facility] that patient needs supervised visits as unable to communicate for himself. Sarah updated. Advised med list will be updated to reflect papers sent by [facility]

## 2024-10-06 NOTE — Patient Instructions (Signed)
 Check labs today  Continue current dose of Depakote  and Lamictal   Call for seizure activity  Follow up in 1 year

## 2024-10-06 NOTE — Progress Notes (Signed)
 Chief Complaint  Patient presents with   Follow-up    RM14, ALONE,  SZ, Spastic hemiplegia of left nondominant side,Schizophrenia, Polypharmacy 1 YR FOLLOWUP.PT ALONE AND NOT VERY RESPONSIVE    ASSESSMENT AND PLAN  [name] is a 68 y.o. male   1.  History of benign intracranial tumor surgery, stroke perioperative period of time in 11-13-03, with residual mild spastic left hemiparesis 2.  Complex partial seizure 3.  Schizophrenia 4.  Polypharmacy treatment 5.  Failure to Thrive   Admission x 2 in July 2025 for AMS/FTT, felt overmedicated, Seroquel  was reduced.  Continue dose of Depakote  DR 500 mg BID, Lamictal  125 mg at bedtime. Under Palliative care at SNF. No seizures reported. -2 ER visits (May/June November 12, 2024) for increased weakness, increased garbled speech.  Felt to be due to breakthrough seizure.  MRI of the brain was negative.  Added aspirin  81 mg daily.  Lamictal  was increased 125 mg at bedtime, continue Depakote  DR 500 mg twice daily.   - Lamictal  level 10.3, Depakote  88  - EEG showed diffuse encephalopathy.   - Check Depakote , Lamictal  level, CBC, CMP - Continue Depakote  DR 500 mg twice a day, Lamictal  125 mg at bedtime - I spoke with POA, [name], she will find out if he had medications this morning to see if labs are trough  - Still having some issues with agitation at facility, remains on lower dose Seroquel . Under Palliative Care - Call for seizures, follow up in 1 year   Orders Placed This Encounter  Procedures   Valproic  Acid Level   Lamotrigine  level   CBC with Differential/Platelet   CMP    DIAGNOSTIC DATA (LABS, IMAGING, TESTING) - I reviewed patient records, labs, notes, testing and imaging myself where available.  CT head without contrast from [facility] in April 2019: Encephalomalacia of right frontal lobe with small vessel disease in the superior right centrum semiovale, stable parietal craniotomy of the right and left superior frontal  region,  Echocardiogram December 2021: Ejection fraction 65 to 70%  EEG October 21, 2020, mild to moderate diffuse encephalopathy, no epileptiform discharge  HISTORICAL data  [name] a 68 year old male, seen in request by his primary care PA [name], [name], PA for evaluation of passing out spells, he is accompanied by his sister [name], who is also his power of attorney at today's visit on March 09, 2021.  I reviewed and summarized the referring note.  Past medical history Stroke with residual left side weakness, Seizure, Intellectual delay Dysphagia, Barrett's esophagus, GERD with gastroparesis, Hypothyroidism, on supplement Hypertension Hyperlipidemia Schizophrenia Diabetes History of Craniotomy,   Patient had a history of craniotomy for benign brain tumor removal in 11-13-03 at [facility], postsurgically, he developed complications, required a second surgery, which is further complicated by stroke, with residual left-sided weakness.  The history is from his sister, I could not find the medical records through epic system  Patient also had lifelong history of schizophrenia, developmentally delayed, intellectual disability, lived with his father, who passed away in 13-Nov-2007, eventually he was placed at a group home since 11-13-11,  He began to have seizure following his craniotomy in 11-13-03, was managed by outside neurologist, has been seizure-free for more than 10 years, stable on current medication of Depakote  DR 500 mg twice a day, and lamotrigine  100 mg daily  In September 2021, he was found to be confused, altered mental status, hypotension, that was improved with hospital admission, hydration, hospital admission  again in November 2021, for aspiration pneumonia, 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, periesophagus hernia, underwent robotic assistant laparoscopy, paraesophageal hernia repair, followed by prolonged rehabilitation,  His condition  has gradually stabilized since, during the process, he was noted to have confusion, near syncope episode, fall at nursing home, there was no clinical seizure activity noted  Since the surgery, he has much improved, almost back to his baseline, he has unsteady gait, carry on simple conversation, has good appetite, residual left hemiparesis  UPDATE Oct 26 2021: He is accompanied by his group home staff [name] at today's visit, who has known patient for 1 year, patient is overall doing well, has back to his baseline, have good appetite, sleeping well, continue have language difficulty, gait abnormality, no seizure-like activity  UPDate April 01 2023: He was accompanied by group home staff [name] at visit, who has known him for 2 months, described his difficulty using left side, which was actually present at previous examination, I tried to call his sister, power of attorney Gerald without success,  Personally reviewed MRI of the brain September 2022, no acute abnormality, large cystic encephalomalacia at the right frontal lobe,  I reviewed emergency room visit on March 22, 2023,  he was taken to the emergency room for weakness, difficulty using his left side, sister is guardian, filed a complaining of non accidental trauma at a group home, Depakote  level was low 43,,    Lab from March 15, 2023, Depakote  level was 43, lamotrigine  was 8.4, normal thyroid  functional test, CBC, CMP,  UPDATE April 23 2023: He is accompanied by group home staff at today's visit, hospital admission on April 22 for worsening left-sided weakness, confusion suspicious for seizure, Depakote  was increased from 500 to 750 mg twice a day  Personally reviewed MRI of the brain April 10, 2023, no acute abnormality, resection cavity with surrounding encephalomalacia gliosis at the right frontal lobe  CT angiogram of head and neck showed no large vessel disease  Laboratory evaluations, normal CBC, hemoglobin of  15.4,  BMP, calcium  of 8.6  Overnight EEG with video April 17 continues 3 to 6 Hz theta delta slowing at the right frontal region, but no epileptiform discharge  Laboratory evaluations in April 2024 normal TSH RPR B12 folic acid  B1, A1c HIV, lamotrigine  level was 10, Depakote  80  UPDATE July 2024: Hospital admission again in June 04, 2023, group home reported slurred speech, sister also described IV rolling backwards, transient unresponsiveness, confused for 3 days  Laboratory showed worsening creatinine 1.6 from baseline of 1.13, ammonia level was normal, personally reviewed MRI, right frontal encephalomalacia, no acute abnormality UDS was positive for benzodiazepine, confirmed by blood test, for his Darin Gerald, there was no record of benzodiazepine use in his medical record  Lab also showed slight elevation of TSH 5.3, lamotrigine  level 8.1, Depakote  level 49,  Seroquel  was decreased from 400 to 200mg  bid since hospital discharge on June 13, remained on Depakote  DR 500 mg twice a day lamotrigine  100 mg once a day  He is overall doing better, more alert with the lower dose of Seroquel , no confusion /passing out spell,  Over the past few months, he was noted to have increased gait abnormality, dragging left leg more  Personally reviewed MRI of the brain June 05, 2023, no acute abnormality, right frontal encephalomalacia, moderate small vessel disease  Update October 07, 2023 SS: Doing well at group home, here with aide. No seizure events. Remains on Depakote  DR 500  mg BID, Lamictal  100 mg PM, Seroquel  300 mg BID for behaviors. Doing more for himself, dressing, tolieting. Needs help with showering. No falls. Mood has been good. Seroquel  was increased by psych few weeks ago, unclear reason? PCP is refilling all medications.   Update May 27, 2024 SS: In the ER 5/22-5/23 for left-sided weakness and altered mental status (general weakness unable to push himself in wheelchair or transfer to the toilet,  little harder to understand).  EEG showed diffuse encephalopathy.  MRI of the brain was negative.  Increased Lamictal  125 mg at bedtime, added aspirin  81 mg daily.  Lamictal  level 10.3, Depakote  88.  Back to the ER 05/25/2024, he was slow to respond with more garbled speech, resolved after about 30 minutes.  His Lamictal  dose had yet to be increased.  Called his group home, his Lamictal  has yet to increased to 125 mg at bedtime, still on 100 mg, gets pill packs. His speech is still garbled, still generally weak, needs 2 person assist, weakness has been ongoing since last June with hospitalization. Before, right arm was strong enough to pull himself. He was refusing PT, has been doing several rounds. Patient's sister, Gerald is here today.   Update October 06, 2024 SS: Admitted for left-sided weakness 07/02/2024, BP 60/40.  Felt symptomatic hypotension rather than seizure or stroke.  Potentially oversedated.  Depakote  level 98 (non-trough levels 128, 133), Lamictal  12.9.  Seroquel  was decreased to 200 mg twice a day, mental status improved.  Continue Depakote  500 mg twice a day, Lamictal  125 mg at bedtime. Sister decided on palliative care. D/c to SNF. Back to the ER 07/13/24 for FTT.  He is here alone today, has no complaints, lost significant amount of weight since I saw him last, is alert, alert to self/DOB/place. Interactive during visit. I called Gerald BROTHERS, he is living at [facility], is under palliative care. No seizures. There are some days when he is agitated, are trying to stay on lower dose Seroquel . He is not doing any walking, he refused PT/OT.   PHYSICAL EXAM   Vitals:   10/06/24 0805  BP: 115/74  Pulse: 64  Resp: 16  SpO2: 96%    There is no height or weight on file to calculate BMI.  PHYSICAL EXAMNIATION:  Gen: NAD, conversant when prompted, thin male      NEUROLOGICAL EXAM:  MENTAL STATUS: Speech/cognition: Dysarthria, cooperative on examination, following simple command,   aware of self, place, gives basic info   CRANIAL NERVES: CN II: Visual fields are full to confrontation. Pupils are round equal and briskly reactive to light. CN III, IV, VI: extraocular movement are normal. No ptosis. CN V: Facial sensation is intact to light touch CN VII: Face is symmetric with normal eye closure  CN VIII: Hearing is normal to causal conversation. CN IX, X: Phonation is normal. CN XI: Head turning and shoulder shrug are intact  MOTOR: Spastic left hemiparesis, antigravity movement of left upper extremity, spasticity left lower extremity, 3/5 movement to left leg. Right arm and right leg 4/5  REFLEXES: Hyperreflexia on the left side  SENSORY: Intact bilaterally to the face, arm, legs  COORDINATION: Dysmetria with finger-nose-finger bilaterally, difficulty with the left arm due to spasticity  GAIT/STANCE: seated in wheelchair, was not ambulated today, right AFO  REVIEW OF SYSTEMS: Full 14 system review of systems performed and notable only for as above All other review of systems were negative.  ALLERGIES: Allergies  Allergen Reactions   Firvanq [Vancomycin]  Hives and Itching          HOME MEDICATIONS: Current Outpatient Medications  Medication Sig Dispense Refill   acetaminophen  (TYLENOL ) 325 MG tablet Take 650 mg by mouth every 4 (four) hours as needed for mild pain (pain score 1-3) or fever.     divalproex  (DEPAKOTE ) 500 MG DR tablet Take 1 tablet (500 mg total) by mouth every 12 (twelve) hours. 60 tablet 0   glycopyrrolate  (ROBINUL ) 1 MG tablet Take 1 tablet (1 mg total) by mouth every 4 (four) hours as needed (excessive secretions).     lamoTRIgine  (LAMICTAL ) 25 MG tablet Take 5 tablets (125 mg total) by mouth at bedtime. 150 tablet 11   mirtazapine (REMERON) 7.5 MG tablet Take 7.5 mg by mouth at bedtime.     ondansetron  (ZOFRAN ) 4 MG tablet Take 4 mg by mouth 4 (four) times daily as needed for vomiting.     polyethylene glycol (MIRALAX  / GLYCOLAX ) 17  g packet Take 17 g by mouth 2 (two) times daily. 14 each 0   QUEtiapine  (SEROQUEL ) 200 MG tablet Take 1 tablet (200 mg total) by mouth 2 (two) times daily. 60 tablet 0   senna (SENOKOT) 8.6 MG TABS tablet Take 1 tablet (8.6 mg total) by mouth daily.     No current facility-administered medications for this visit.    PAST MEDICAL HISTORY: Past Medical History:  Diagnosis Date   Acute esophagitis 01/09/2015   Acute hypoxemic respiratory failure (HCC) 10/21/2020   Barrett esophagus    Bowel obstruction (HCC)    DM II (diabetes mellitus, type II), controlled (HCC) 11/13/2020   Dysphagia    Encephalomalacia on imaging study 07/02/2024   Gastric polyps    Gastrointestinal hemorrhage associated with other gastritis    Gastroparesis    GERD (gastroesophageal reflux disease)    GI bleed 01/08/2015   GIB (gastrointestinal bleeding) 01/08/2015   Hemiplegia (HCC) 04/05/2008   Qualifier: Diagnosis of   By: Orlando CMA LEODIS), Stephanie         History of diverticulosis 08/21/2004   Centricity Description: DIVERTICULOSIS, COLON  Qualifier: Diagnosis of   By: Orlando CMA LEODIS), Corean      Centricity Description: DIVERTICULOSIS-COLON  Qualifier: Diagnosis of   By: Ever Riggers, Amy S        History of Esophageal dilation and tortuosity on EGD 11/18/2020   History of Esophageal dysmotility 11/18/2020   Hypercholesteremia    Hypertension    Intellectual disability    Iron deficiency anemia    Loss of weight 07/04/2009   Centricity Description: WEIGHT LOSS  Qualifier: Diagnosis of   By: Drucilla BANANA, Pam      Centricity Description: WEIGHT LOSS-ABNORMAL  Qualifier: Diagnosis of   By: Ever Riggers, Amy S        Malnutrition of moderate degree 07/03/2024   S/P craniotomy 03/09/2021   S/P percutaneous endoscopic gastrostomy (PEG) tube placement (HCC)    S/P repair of paraesophageal hernia 10/26/2020   Schizophrenia (HCC)    Seizure (HCC)    Stroke (HCC)    Syncope 10/21/2020   Upper  gastrointestinal bleed     PAST SURGICAL HISTORY: Past Surgical History:  Procedure Laterality Date   BIOPSY  10/21/2020   Procedure: BIOPSY;  Surgeon: Avram Lupita BRAVO, MD;  Location: [facility] ENDOSCOPY;  Service: Endoscopy;;   CRANIOTOMY FOR CYST FENESTRATION     ESOPHAGOGASTRODUODENOSCOPY N/A 01/09/2015   Procedure: ESOPHAGOGASTRODUODENOSCOPY (EGD);  Surgeon: Lamar JONETTA Aho, MD;  Location: [facility] ENDOSCOPY;  Service: Endoscopy;  Laterality: N/A;   ESOPHAGOGASTRODUODENOSCOPY N/A 10/26/2020   Procedure: ESOPHAGOGASTRODUODENOSCOPY (EGD);  Surgeon: Shyrl Linnie KIDD, MD;  Location: [facility] OR;  Service: Thoracic;  Laterality: N/A;   ESOPHAGOGASTRODUODENOSCOPY (EGD) WITH PROPOFOL  N/A 10/21/2020   Procedure: ESOPHAGOGASTRODUODENOSCOPY (EGD) WITH PROPOFOL ;  Surgeon: Avram Lupita BRAVO, MD;  Location: [facility] ENDOSCOPY;  Service: Endoscopy;  Laterality: N/A;   IR GASTR TUBE CONVERT GASTR-JEJ PER W/FL MOD SED  11/07/2020   PEG PLACEMENT N/A 10/26/2020   Procedure: PERCUTANEOUS ENDOSCOPIC GASTROSTOMY (PEG) PLACEMENT;  Surgeon: Shyrl Linnie KIDD, MD;  Location: [facility] OR;  Service: Thoracic;  Laterality: N/A;   XI ROBOTIC ASSISTED HIATAL HERNIA REPAIR N/A 10/26/2020   Procedure: XI ROBOTIC ASSISTED HIATAL HERNIA REPAIR;  Surgeon: Shyrl Linnie KIDD, MD;  Location: [facility] OR;  Service: Thoracic;  Laterality: N/A;    FAMILY HISTORY: Family History  Problem Relation Age of Onset   Breast cancer Mother    Heart disease Father     SOCIAL HISTORY: Social History   Socioeconomic History   Marital status: Single    Spouse name: Not on file   Number of children: 0   Years of education: 12   Highest education level: High school graduate  Occupational History   Occupation: Disabled  Tobacco Use   Smoking status: Never   Smokeless tobacco: Never  Vaping Use   Vaping status: Never Used  Substance and Sexual Activity   Alcohol  use: Not Currently   Drug use: Never   Sexual activity: Not Currently  Other Topics  Concern   Not on file  Social History Narrative   Lives at [facility] group home (ph: [phone]).   Left-handed.   No daily caffeine use.   Uses wheel chair but working w/pt and ot to get back with walker 06/26/23   18kcal heart healthy diet thin liquid aspiration precautions meds whole in applesauce    Social Drivers of Health   Financial Resource Strain: Not on file  Food Insecurity: No Food Insecurity (07/14/2024)   Hunger Vital Sign    Worried About Running Out of Food in the Last Year: Never true    Ran Out of Food in the Last Year: Never true  Transportation Needs: No Transportation Needs (07/14/2024)   PRAPARE - Administrator, Civil Service (Medical): No    Lack of Transportation (Non-Medical): No  Physical Activity: Not on file  Stress: Not on file  Social Connections: Unknown (07/14/2024)   Social Connection and Isolation Panel    Frequency of Communication with Friends and Family: More than three times a week    Frequency of Social Gatherings with Friends and Family: Three times a week    Attends Religious Services: More than 4 times per year    Active Member of Clubs or Organizations: No    Attends Banker Meetings: Never    Marital Status: Patient unable to answer  Intimate Partner Violence: Not At Risk (07/14/2024)   Humiliation, Afraid, Rape, and Kick questionnaire    Fear of Current or Ex-Partner: No    Emotionally Abused: No    Physically Abused: No    Sexually Abused: No   Lauraine Born, SCHARLENE, DNP  [facility] [address], [address] [place], [place] [address] [phone]

## 2024-10-06 NOTE — Telephone Encounter (Signed)
 Patient's sister, Marval Mulberry called to leave information of medication list. Ms. Mulberry wanted to make sure [facility] had the envelope sent with him for the appointment with medication list inside and had the phone number call to pick him up after the appointment

## 2024-10-07 LAB — COMPREHENSIVE METABOLIC PANEL WITH GFR
ALT: 10 IU/L (ref 0–44)
AST: 20 IU/L (ref 0–40)
Albumin: 4.5 g/dL (ref 3.9–4.9)
Alkaline Phosphatase: 76 IU/L (ref 47–123)
BUN/Creatinine Ratio: 10 (ref 10–24)
BUN: 12 mg/dL (ref 8–27)
Bilirubin Total: 0.4 mg/dL (ref 0.0–1.2)
CO2: 24 mmol/L (ref 20–29)
Calcium: 9.9 mg/dL (ref 8.6–10.2)
Chloride: 97 mmol/L (ref 96–106)
Creatinine, Ser: 1.16 mg/dL (ref 0.76–1.27)
Globulin, Total: 3.5 g/dL (ref 1.5–4.5)
Glucose: 87 mg/dL (ref 70–99)
Potassium: 4 mmol/L (ref 3.5–5.2)
Sodium: 142 mmol/L (ref 134–144)
Total Protein: 8 g/dL (ref 6.0–8.5)
eGFR: 69 mL/min/1.73 (ref >59)

## 2024-10-07 LAB — CBC WITH DIFFERENTIAL/PLATELET
Basophils Absolute: 0 x10E3/uL (ref 0.0–0.2)
Basos: 0 %
EOS (ABSOLUTE): 0.1 x10E3/uL (ref 0.0–0.4)
Eos: 1 %
Hematocrit: 51.1 % — ABNORMAL HIGH (ref 37.5–51.0)
Hemoglobin: 16.4 g/dL (ref 13.0–17.7)
Immature Grans (Abs): 0 x10E3/uL (ref 0.0–0.1)
Immature Granulocytes: 0 %
Lymphocytes Absolute: 1.5 x10E3/uL (ref 0.7–3.1)
Lymphs: 12 %
MCH: 31.5 pg (ref 26.6–33.0)
MCHC: 32.1 g/dL (ref 31.5–35.7)
MCV: 98 fL — ABNORMAL HIGH (ref 79–97)
Monocytes Absolute: 0.5 x10E3/uL (ref 0.1–0.9)
Monocytes: 4 %
Neutrophils Absolute: 10.1 x10E3/uL — ABNORMAL HIGH (ref 1.4–7.0)
Neutrophils: 83 %
Platelets: 193 x10E3/uL (ref 150–450)
RBC: 5.2 x10E6/uL (ref 4.14–5.80)
RDW: 12.7 % (ref 11.6–15.4)
WBC: 12.2 x10E3/uL — ABNORMAL HIGH (ref 3.4–10.8)

## 2024-10-07 LAB — LAMOTRIGINE LEVEL: Lamotrigine Lvl: 15.1 ug/mL (ref 2.0–20.0)

## 2024-10-07 LAB — VALPROIC ACID LEVEL: Valproic Acid Lvl: 119 ug/mL — ABNORMAL HIGH (ref 50–100)

## 2024-10-08 ENCOUNTER — Ambulatory Visit: Payer: Self-pay | Admitting: Neurology

## 2024-10-14 NOTE — Telephone Encounter (Signed)
 I called POA, [name]. Will continue with current dose of Depakote  and Lamictal . Patient is doing well right now. No seizures, no worsening confusion. Is with Hospice now. They have appointment in Dec with Dr. Onita, not sure that visit is needed, Marval will reach out before and cancel visit if not needed. Monitor for worsening confusion, gait instability that could be from Depakote .

## 2024-10-23 ENCOUNTER — Encounter: Payer: Self-pay | Admitting: Neurology

## 2024-12-07 ENCOUNTER — Encounter: Payer: Self-pay | Admitting: Neurology

## 2024-12-07 ENCOUNTER — Ambulatory Visit (INDEPENDENT_AMBULATORY_CARE_PROVIDER_SITE_OTHER): Admitting: Neurology

## 2024-12-07 VITALS — BP 100/65 | HR 66 | Wt 100.0 lb

## 2024-12-07 DIAGNOSIS — I69954 Hemiplegia and hemiparesis following unspecified cerebrovascular disease affecting left non-dominant side: Secondary | ICD-10-CM

## 2024-12-07 DIAGNOSIS — G40909 Epilepsy, unspecified, not intractable, without status epilepticus: Secondary | ICD-10-CM | POA: Diagnosis not present

## 2024-12-07 DIAGNOSIS — Z79899 Other long term (current) drug therapy: Secondary | ICD-10-CM

## 2024-12-07 DIAGNOSIS — F209 Schizophrenia, unspecified: Secondary | ICD-10-CM

## 2024-12-07 NOTE — Progress Notes (Unsigned)
 Chief Complaint  Patient presents with   Follow-up    Pt in room 14. Sister in room. Here for follow up visit.    ASSESSMENT AND PLAN  [name] is a 68 y.o. male   1.  History of benign intracranial tumor surgery, stroke perioperative period of time in 12-28-03, with residual mild spastic left hemiparesis 2.  Complex partial seizure 3.  Schizophrenia 4.  Polypharmacy treatment 5.  Failure to Thrive   Admission x 2 in July 2025 for AMS/FTT, felt overmedicated, Seroquel  was reduced.  Continue dose of Depakote  DR 500 mg BID, Lamictal  125 mg at bedtime. Under Palliative care at SNF. No seizures reported. -2 ER visits (May/June 12-27-24) for increased weakness, increased garbled speech.  Felt to be due to breakthrough seizure.  MRI of the brain was negative.  Added aspirin  81 mg daily.  Lamictal  was increased 125 mg at bedtime, continue Depakote  DR 500 mg twice daily.   - Lamictal  level 10.3, Depakote  88  - EEG showed diffuse encephalopathy.   - Check Depakote , Lamictal  level, CBC, CMP - Continue Depakote  DR 500 mg twice a day, Lamictal  125 mg at bedtime - I spoke with POA, [name], she will find out if he had medications this morning to see if labs are trough  - Still having some issues with agitation at facility, remains on lower dose Seroquel . Under Palliative Care - Call for seizures, follow up in 1 year   No orders of the defined types were placed in this encounter.   DIAGNOSTIC DATA (LABS, IMAGING, TESTING) - I reviewed patient records, labs, notes, testing and imaging myself where available.  CT head without contrast from [facility] in April 2019: Encephalomalacia of right frontal lobe with small vessel disease in the superior right centrum semiovale, stable parietal craniotomy of the right and left superior frontal region,  Echocardiogram 12-27-20: Ejection fraction 65 to 70%  EEG October 21, 2020, mild to moderate diffuse encephalopathy, no  epileptiform discharge  HISTORICAL data  EDWAR COE a 68 year old male, seen in request by his primary care PA [name], [name], PA for evaluation of passing out spells, he is accompanied by his sister Marval Mulberry, who is also his power of attorney at today's visit on March 09, 2021.  I reviewed and summarized the referring note.  Past medical history Stroke with residual left side weakness, Seizure, Intellectual delay Dysphagia, Barrett's esophagus, GERD with gastroparesis, Hypothyroidism, on supplement Hypertension Hyperlipidemia Schizophrenia Diabetes History of Craniotomy,   Patient had a history of craniotomy for benign brain tumor removal in 2003/12/28 at [facility], postsurgically, he developed complications, required a second surgery, which is further complicated by stroke, with residual left-sided weakness.  The history is from his sister, I could not find the medical records through epic system  Patient also had lifelong history of schizophrenia, developmentally delayed, intellectual disability, lived with his father, who passed away in Dec 28, 2007, eventually he was placed at a group home since 2011/12/28,  He began to have seizure following his craniotomy in 12/28/2003, was managed by outside neurologist, has been seizure-free for more than 10 years, stable on current medication of Depakote  DR 500 mg twice a day, and lamotrigine  100 mg daily  In September 2021, he was found to be confused, altered mental status, hypotension, that was improved with hospital admission, hydration, hospital admission again in November 2021, for aspiration pneumonia, 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, periesophagus hernia, underwent robotic assistant laparoscopy,  paraesophageal hernia repair, followed by prolonged rehabilitation,  His condition has gradually stabilized since, during the process, he was noted to have confusion, near syncope episode, fall at nursing home, there was  no clinical seizure activity noted  Since the surgery, he has much improved, almost back to his baseline, he has unsteady gait, carry on simple conversation, has good appetite, residual left hemiparesis  UPDATE Oct 26 2021: He is accompanied by his group home staff Thomas Zigbuo at today's visit, who has known patient for 1 year, patient is overall doing well, has back to his baseline, have good appetite, sleeping well, continue have language difficulty, gait abnormality, no seizure-like activity  UPDate April 01 2023: He was accompanied by group home staff Prentice Louder at visit, who has known him for 2 months, described his difficulty using left side, which was actually present at previous examination, I tried to call his sister, power of attorney Marval without success,  Personally reviewed MRI of the brain September 2022, no acute abnormality, large cystic encephalomalacia at the right frontal lobe,  I reviewed emergency room visit on March 22, 2023,  he was taken to the emergency room for weakness, difficulty using his left side, sister is guardian, filed a complaining of non accidental trauma at a group home, Depakote  level was low 43,,    Lab from March 15, 2023, Depakote  level was 43, lamotrigine  was 8.4, normal thyroid  functional test, CBC, CMP,  UPDATE April 23 2023: He is accompanied by group home staff at today's visit, hospital admission on April 22 for worsening left-sided weakness, confusion suspicious for seizure, Depakote  was increased from 500 to 750 mg twice a day  Personally reviewed MRI of the brain April 10, 2023, no acute abnormality, resection cavity with surrounding encephalomalacia gliosis at the right frontal lobe  CT angiogram of head and neck showed no large vessel disease  Laboratory evaluations, normal CBC, hemoglobin of  15.4, BMP, calcium  of 8.6  Overnight EEG with video April 17 continues 3 to 6 Hz theta delta slowing at the right frontal region, but no  epileptiform discharge  Laboratory evaluations in April 2024 normal TSH RPR B12 folic acid  B1, A1c HIV, lamotrigine  level was 10, Depakote  80  UPDATE July 2024: Hospital admission again in June 04, 2023, group home reported slurred speech, sister also described eyes rolling backwards, transient unresponsiveness, confused for 3 days  Laboratory showed worsening creatinine 1.6 from baseline of 1.13, ammonia level was normal, personally reviewed MRI, right frontal encephalomalacia, no acute abnormality UDS was positive for benzodiazepine, confirmed by blood test, for his Darin Marval, there was no record of benzodiazepine use in his medical record  Lab also showed slight elevation of TSH 5.3, lamotrigine  level 8.1, Depakote  level 49,  Seroquel  was decreased from 400 to 200mg  bid since hospital discharge on June 13, remained on Depakote  DR 500 mg twice a day lamotrigine  100 mg once a day  He is overall doing better, more alert with the lower dose of Seroquel , no confusion /passing out spell,  Over the past few months, he was noted to have increased gait abnormality, dragging left leg more  Personally reviewed MRI of the brain June 05, 2023, no acute abnormality, right frontal encephalomalacia, moderate small vessel disease  UPDATE Dec 15th 2025: He is brought in by his sister Marval at today's visit. In palliative care, Depakote  sprinkle 125mg  4 tabs bid, Lamotrigine  25mg  x5 tabs, mirtazapine 7.5mg , seorquel 250mg  bid,   he is overall stable on current  polypharmacy, otherwise, he can become very agitated,  at [facility].  ER 5/22-5/23 2025 for left-sided weakness and altered mental status (general weakness unable to push himself in wheelchair or transfer to the toilet, little harder to understand).  EEG showed diffuse encephalopathy.  MRI of the brain was negative.  Increased Lamictal  125 mg at bedtime, added aspirin  81 mg daily.  Lamictal  level 10.3, Depakote  88.  Back to the ER  05/25/2024, he was slow to respond with more garbled speech, resolved after about 30 minutes.  His Lamictal  dose had yet to be increased from 100 to 125 mg.  Admitted for left-sided weakness 07/02/2024, BP 60/40.  Felt symptomatic hypotension rather than seizure or stroke.  Potentially oversedated.  Depakote  level 98 (non-trough levels 128, 133), Lamictal  12.9.  Seroquel  was decreased to 200 mg twice a day, mental status improved.    Continue Depakote  500 mg twice a day, Lamictal  125 mg at bedtime. Sister decided on palliative care. D/c to SNF. Back to the ER 07/13/24 for FTT.  He is here alone today, has no complaints, lost significant amount of weight since I saw him last, is alert, alert to self/DOB/place. Interactive during visit. I called Marval BROTHERS, he is living at [facility], is under palliative care. No seizures. There are some days when he is agitated, are trying to stay on lower dose Seroquel . He is not doing any walking, he refused PT/OT.   PHYSICAL EXAM   Vitals:   12/07/24 1328  BP: 100/65  Pulse: 66  SpO2: 100%  Weight: 100 lb (45.4 kg)    Body mass index is 15.2 kg/m.  PHYSICAL EXAMNIATION:  Gen: NAD, conversant when prompted, thin male      NEUROLOGICAL EXAM:  MENTAL STATUS: Speech/cognition: Dysarthria, cooperative on examination, following simple command,  aware of self, place, gives basic info   CRANIAL NERVES: CN II: Visual fields are full to confrontation. Pupils are round equal and briskly reactive to light. CN III, IV, VI: extraocular movement are normal. No ptosis. CN V: Facial sensation is intact to light touch CN VII: Face is symmetric with normal eye closure  CN VIII: Hearing is normal to causal conversation. CN IX, X: Phonation is normal. CN XI: Head turning and shoulder shrug are intact  MOTOR: Spastic left hemiparesis, antigravity movement of left upper extremity, spasticity left lower extremity, 3/5 movement to left leg. Right arm and right leg  4/5  REFLEXES: Hyperreflexia on the left side  SENSORY: Intact bilaterally to the face, arm, legs  COORDINATION: Dysmetria with finger-nose-finger bilaterally, difficulty with the left arm due to spasticity  GAIT/STANCE: seated in wheelchair, was not ambulated today, right AFO  REVIEW OF SYSTEMS: Full 14 system review of systems performed and notable only for as above All other review of systems were negative.  ALLERGIES: Allergies  Allergen Reactions   Firvanq [Vancomycin] Hives and Itching          HOME MEDICATIONS: Current Outpatient Medications  Medication Sig Dispense Refill   acetaminophen  (TYLENOL ) 325 MG tablet Take 650 mg by mouth every 4 (four) hours as needed for mild pain (pain score 1-3) or fever.     divalproex  (DEPAKOTE ) 500 MG DR tablet Take 1 tablet (500 mg total) by mouth every 12 (twelve) hours. 60 tablet 0   glycopyrrolate  (ROBINUL ) 1 MG tablet Take 1 tablet (1 mg total) by mouth every 4 (four) hours as needed (excessive secretions).     lamoTRIgine  (LAMICTAL ) 25 MG tablet Take 5  tablets (125 mg total) by mouth at bedtime. 150 tablet 11   mirtazapine (REMERON) 7.5 MG tablet Take 7.5 mg by mouth at bedtime.     ondansetron  (ZOFRAN ) 4 MG tablet Take 4 mg by mouth 4 (four) times daily as needed for vomiting.     polyethylene glycol (MIRALAX  / GLYCOLAX ) 17 g packet Take 17 g by mouth 2 (two) times daily. 14 each 0   QUEtiapine  (SEROQUEL ) 200 MG tablet Take 1 tablet (200 mg total) by mouth 2 (two) times daily. 60 tablet 0   senna (SENOKOT) 8.6 MG TABS tablet Take 1 tablet (8.6 mg total) by mouth daily.     No current facility-administered medications for this visit.    PAST MEDICAL HISTORY: Past Medical History:  Diagnosis Date   Acute esophagitis 01/09/2015   Acute hypoxemic respiratory failure (HCC) 10/21/2020   Barrett esophagus    Bowel obstruction (HCC)    DM II (diabetes mellitus, type II), controlled (HCC) 11/13/2020   Dysphagia     Encephalomalacia on imaging study 07/02/2024   Gastric polyps    Gastrointestinal hemorrhage associated with other gastritis    Gastroparesis    GERD (gastroesophageal reflux disease)    GI bleed 01/08/2015   GIB (gastrointestinal bleeding) 01/08/2015   Hemiplegia (HCC) 04/05/2008   Qualifier: Diagnosis of   By: Orlando CMA LEODIS), Stephanie         History of diverticulosis 08/21/2004   Centricity Description: DIVERTICULOSIS, COLON  Qualifier: Diagnosis of   By: Orlando CMA LEODIS), Corean      Centricity Description: DIVERTICULOSIS-COLON  Qualifier: Diagnosis of   By: Ever Riggers, Amy S        History of Esophageal dilation and tortuosity on EGD 11/18/2020   History of Esophageal dysmotility 11/18/2020   Hypercholesteremia    Hypertension    Intellectual disability    Iron deficiency anemia    Loss of weight 07/04/2009   Centricity Description: WEIGHT LOSS  Qualifier: Diagnosis of   By: Drucilla BANANA, Pam      Centricity Description: WEIGHT LOSS-ABNORMAL  Qualifier: Diagnosis of   By: Ever Riggers, Amy S        Malnutrition of moderate degree 07/03/2024   S/P craniotomy 03/09/2021   S/P percutaneous endoscopic gastrostomy (PEG) tube placement (HCC)    S/P repair of paraesophageal hernia 10/26/2020   Schizophrenia (HCC)    Seizure (HCC)    Stroke (HCC)    Syncope 10/21/2020   Upper gastrointestinal bleed     PAST SURGICAL HISTORY: Past Surgical History:  Procedure Laterality Date   BIOPSY  10/21/2020   Procedure: BIOPSY;  Surgeon: Avram Lupita BRAVO, MD;  Location: [facility] ENDOSCOPY;  Service: Endoscopy;;   CRANIOTOMY FOR CYST FENESTRATION     ESOPHAGOGASTRODUODENOSCOPY N/A 01/09/2015   Procedure: ESOPHAGOGASTRODUODENOSCOPY (EGD);  Surgeon: Lamar JONETTA Aho, MD;  Location: [facility] ENDOSCOPY;  Service: Endoscopy;  Laterality: N/A;   ESOPHAGOGASTRODUODENOSCOPY N/A 10/26/2020   Procedure: ESOPHAGOGASTRODUODENOSCOPY (EGD);  Surgeon: Shyrl Linnie KIDD, MD;  Location: [facility] OR;  Service:  Thoracic;  Laterality: N/A;   ESOPHAGOGASTRODUODENOSCOPY (EGD) WITH PROPOFOL  N/A 10/21/2020   Procedure: ESOPHAGOGASTRODUODENOSCOPY (EGD) WITH PROPOFOL ;  Surgeon: Avram Lupita BRAVO, MD;  Location: [facility] ENDOSCOPY;  Service: Endoscopy;  Laterality: N/A;   IR GASTR TUBE CONVERT GASTR-JEJ PER W/FL MOD SED  11/07/2020   PEG PLACEMENT N/A 10/26/2020   Procedure: PERCUTANEOUS ENDOSCOPIC GASTROSTOMY (PEG) PLACEMENT;  Surgeon: Shyrl Linnie KIDD, MD;  Location: [facility] OR;  Service: Thoracic;  Laterality: N/A;   XI ROBOTIC  ASSISTED HIATAL HERNIA REPAIR N/A 10/26/2020   Procedure: XI ROBOTIC ASSISTED HIATAL HERNIA REPAIR;  Surgeon: Shyrl Linnie KIDD, MD;  Location: [facility] OR;  Service: Thoracic;  Laterality: N/A;    FAMILY HISTORY: Family History  Problem Relation Age of Onset   Breast cancer Mother    Heart disease Father     SOCIAL HISTORY: Social History   Socioeconomic History   Marital status: Single    Spouse name: Not on file   Number of children: 0   Years of education: 12   Highest education level: High school graduate  Occupational History   Occupation: Disabled  Tobacco Use   Smoking status: Never   Smokeless tobacco: Never  Vaping Use   Vaping status: Never Used  Substance and Sexual Activity   Alcohol  use: Not Currently   Drug use: Never   Sexual activity: Not Currently  Other Topics Concern   Not on file  Social History Narrative   Lives at [facility] group home (ph: [phone]).   Left-handed.   No daily caffeine use.   Uses wheel chair but working w/pt and ot to get back with walker 06/26/23   18kcal heart healthy diet thin liquid aspiration precautions meds whole in applesauce    Social Drivers of Health   Tobacco Use: Low Risk (12/07/2024)   Patient History    Smoking Tobacco Use: Never    Smokeless Tobacco Use: Never    Passive Exposure: Not on file  Financial Resource Strain: Not on file  Food Insecurity: No Food Insecurity (07/14/2024)   Epic    Worried About Community Education Officer in the Last Year: Never true    Ran Out of Food in the Last Year: Never true  Transportation Needs: No Transportation Needs (07/14/2024)   Epic    Lack of Transportation (Medical): No    Lack of Transportation (Non-Medical): No  Physical Activity: Not on file  Stress: Not on file  Social Connections: Unknown (07/14/2024)   Social Connection and Isolation Panel    Frequency of Communication with Friends and Family: More than three times a week    Frequency of Social Gatherings with Friends and Family: Three times a week    Attends Religious Services: More than 4 times per year    Active Member of Clubs or Organizations: No    Attends Banker Meetings: Never    Marital Status: Patient unable to answer  Intimate Partner Violence: Not At Risk (07/14/2024)   Epic    Fear of Current or Ex-Partner: No    Emotionally Abused: No    Physically Abused: No    Sexually Abused: No  Depression (PHQ2-9): Low Risk (07/10/2022)   Depression (PHQ2-9)    PHQ-2 Score: 0  Alcohol  Screen: Not on file  Housing: Low Risk (07/14/2024)   Epic    Unable to Pay for Housing in the Last Year: No    Number of Times Moved in the Last Year: 0    Homeless in the Last Year: No  Utilities: Not At Risk (07/14/2024)   Epic    Threatened with loss of utilities: No  Health Literacy: Not on file   Modena Callander. M.D. Ph.D.

## 2025-10-06 ENCOUNTER — Ambulatory Visit: Admitting: Neurology
